# Patient Record
Sex: Female | Born: 1975
Health system: Southern US, Community
[De-identification: ages and names within clinical notes are randomized; demographics above are authoritative.]

## PROBLEM LIST (undated history)

## (undated) DIAGNOSIS — D509 Iron deficiency anemia, unspecified: Secondary | ICD-10-CM

## (undated) DIAGNOSIS — F419 Anxiety disorder, unspecified: Secondary | ICD-10-CM

## (undated) DIAGNOSIS — I779 Disorder of arteries and arterioles, unspecified: Secondary | ICD-10-CM

## (undated) DIAGNOSIS — E785 Hyperlipidemia, unspecified: Secondary | ICD-10-CM

## (undated) DIAGNOSIS — Z9889 Other specified postprocedural states: Secondary | ICD-10-CM

## (undated) DIAGNOSIS — D573 Sickle-cell trait: Secondary | ICD-10-CM

## (undated) DIAGNOSIS — R011 Cardiac murmur, unspecified: Secondary | ICD-10-CM

## (undated) DIAGNOSIS — Z794 Long term (current) use of insulin: Secondary | ICD-10-CM

## (undated) DIAGNOSIS — E119 Type 2 diabetes mellitus without complications: Secondary | ICD-10-CM

## (undated) DIAGNOSIS — R0600 Dyspnea, unspecified: Secondary | ICD-10-CM

## (undated) DIAGNOSIS — Z9289 Personal history of other medical treatment: Secondary | ICD-10-CM

## (undated) DIAGNOSIS — J189 Pneumonia, unspecified organism: Secondary | ICD-10-CM

## (undated) DIAGNOSIS — N186 End stage renal disease: Secondary | ICD-10-CM

## (undated) DIAGNOSIS — M1710 Unilateral primary osteoarthritis, unspecified knee: Secondary | ICD-10-CM

## (undated) DIAGNOSIS — E113499 Type 2 diabetes mellitus with severe nonproliferative diabetic retinopathy without macular edema, unspecified eye: Secondary | ICD-10-CM

## (undated) DIAGNOSIS — M6702 Short Achilles tendon (acquired), left ankle: Secondary | ICD-10-CM

## (undated) DIAGNOSIS — N939 Abnormal uterine and vaginal bleeding, unspecified: Secondary | ICD-10-CM

## (undated) DIAGNOSIS — F319 Bipolar disorder, unspecified: Secondary | ICD-10-CM

## (undated) DIAGNOSIS — I2510 Atherosclerotic heart disease of native coronary artery without angina pectoris: Secondary | ICD-10-CM

## (undated) DIAGNOSIS — Z992 Dependence on renal dialysis: Secondary | ICD-10-CM

## (undated) DIAGNOSIS — F329 Major depressive disorder, single episode, unspecified: Secondary | ICD-10-CM

## (undated) DIAGNOSIS — Z8614 Personal history of Methicillin resistant Staphylococcus aureus infection: Secondary | ICD-10-CM

## (undated) DIAGNOSIS — I219 Acute myocardial infarction, unspecified: Secondary | ICD-10-CM

## (undated) DIAGNOSIS — H3500 Unspecified background retinopathy: Secondary | ICD-10-CM

## (undated) DIAGNOSIS — Z9861 Coronary angioplasty status: Secondary | ICD-10-CM

## (undated) DIAGNOSIS — E1140 Type 2 diabetes mellitus with diabetic neuropathy, unspecified: Secondary | ICD-10-CM

## (undated) DIAGNOSIS — R112 Nausea with vomiting, unspecified: Secondary | ICD-10-CM

## (undated) DIAGNOSIS — I10 Essential (primary) hypertension: Secondary | ICD-10-CM

## (undated) DIAGNOSIS — F32A Depression, unspecified: Secondary | ICD-10-CM

## (undated) DIAGNOSIS — I252 Old myocardial infarction: Secondary | ICD-10-CM

## (undated) DIAGNOSIS — E11610 Type 2 diabetes mellitus with diabetic neuropathic arthropathy: Secondary | ICD-10-CM

## (undated) HISTORY — DX: Hyperlipidemia, unspecified: E78.5

## (undated) HISTORY — DX: End stage renal disease: N18.6

## (undated) HISTORY — DX: Unspecified background retinopathy: H35.00

## (undated) HISTORY — PX: FOOT SURGERY: SHX648

## (undated) HISTORY — DX: Cardiac murmur, unspecified: R01.1

## (undated) HISTORY — PX: CORONARY ANGIOPLASTY: SHX604

## (undated) HISTORY — DX: Old myocardial infarction: I25.2

## (undated) HISTORY — DX: Dependence on renal dialysis: Z99.2

---

## 1995-11-15 HISTORY — PX: TUBAL LIGATION: SHX77

## 1998-09-28 ENCOUNTER — Emergency Department (HOSPITAL_COMMUNITY): Admission: EM | Admit: 1998-09-28 | Discharge: 1998-09-28 | Payer: Self-pay | Admitting: Emergency Medicine

## 1998-10-07 ENCOUNTER — Emergency Department (HOSPITAL_COMMUNITY): Admission: EM | Admit: 1998-10-07 | Discharge: 1998-10-07 | Payer: Self-pay | Admitting: Emergency Medicine

## 1999-05-11 ENCOUNTER — Encounter: Payer: Self-pay | Admitting: Internal Medicine

## 1999-05-11 ENCOUNTER — Ambulatory Visit (HOSPITAL_COMMUNITY): Admission: RE | Admit: 1999-05-11 | Discharge: 1999-05-11 | Payer: Self-pay | Admitting: Internal Medicine

## 2000-04-19 ENCOUNTER — Emergency Department (HOSPITAL_COMMUNITY): Admission: EM | Admit: 2000-04-19 | Discharge: 2000-04-19 | Payer: Self-pay | Admitting: Emergency Medicine

## 2000-05-28 ENCOUNTER — Inpatient Hospital Stay (HOSPITAL_COMMUNITY): Admission: AD | Admit: 2000-05-28 | Discharge: 2000-05-28 | Payer: Self-pay | Admitting: *Deleted

## 2000-06-22 ENCOUNTER — Encounter: Payer: Self-pay | Admitting: Obstetrics & Gynecology

## 2000-06-22 ENCOUNTER — Inpatient Hospital Stay (HOSPITAL_COMMUNITY): Admission: EM | Admit: 2000-06-22 | Discharge: 2000-06-22 | Payer: Self-pay | Admitting: Obstetrics & Gynecology

## 2000-06-22 ENCOUNTER — Encounter (INDEPENDENT_AMBULATORY_CARE_PROVIDER_SITE_OTHER): Payer: Self-pay | Admitting: Specialist

## 2000-06-29 ENCOUNTER — Encounter: Admission: RE | Admit: 2000-06-29 | Discharge: 2000-06-29 | Payer: Self-pay | Admitting: Obstetrics

## 2000-11-10 ENCOUNTER — Emergency Department (HOSPITAL_COMMUNITY): Admission: EM | Admit: 2000-11-10 | Discharge: 2000-11-10 | Payer: Self-pay | Admitting: Emergency Medicine

## 2000-11-10 ENCOUNTER — Encounter: Payer: Self-pay | Admitting: Emergency Medicine

## 2000-11-13 ENCOUNTER — Emergency Department (HOSPITAL_COMMUNITY): Admission: EM | Admit: 2000-11-13 | Discharge: 2000-11-13 | Payer: Self-pay | Admitting: Emergency Medicine

## 2000-11-21 ENCOUNTER — Emergency Department (HOSPITAL_COMMUNITY): Admission: EM | Admit: 2000-11-21 | Discharge: 2000-11-21 | Payer: Self-pay | Admitting: Emergency Medicine

## 2001-04-08 ENCOUNTER — Emergency Department (HOSPITAL_COMMUNITY): Admission: EM | Admit: 2001-04-08 | Discharge: 2001-04-08 | Payer: Self-pay | Admitting: Emergency Medicine

## 2001-10-04 ENCOUNTER — Inpatient Hospital Stay (HOSPITAL_COMMUNITY): Admission: AD | Admit: 2001-10-04 | Discharge: 2001-10-04 | Payer: Self-pay | Admitting: Obstetrics & Gynecology

## 2002-01-21 ENCOUNTER — Inpatient Hospital Stay (HOSPITAL_COMMUNITY): Admission: AD | Admit: 2002-01-21 | Discharge: 2002-01-21 | Payer: Self-pay | Admitting: *Deleted

## 2002-03-27 ENCOUNTER — Emergency Department (HOSPITAL_COMMUNITY): Admission: EM | Admit: 2002-03-27 | Discharge: 2002-03-27 | Payer: Self-pay | Admitting: *Deleted

## 2003-02-19 ENCOUNTER — Emergency Department (HOSPITAL_COMMUNITY): Admission: EM | Admit: 2003-02-19 | Discharge: 2003-02-19 | Payer: Self-pay | Admitting: Emergency Medicine

## 2003-02-19 ENCOUNTER — Encounter: Payer: Self-pay | Admitting: Emergency Medicine

## 2003-05-31 ENCOUNTER — Emergency Department (HOSPITAL_COMMUNITY): Admission: AD | Admit: 2003-05-31 | Discharge: 2003-06-01 | Payer: Self-pay | Admitting: Emergency Medicine

## 2003-06-10 ENCOUNTER — Encounter: Admission: RE | Admit: 2003-06-10 | Discharge: 2003-09-08 | Payer: Self-pay | Admitting: Emergency Medicine

## 2003-08-17 ENCOUNTER — Emergency Department (HOSPITAL_COMMUNITY): Admission: EM | Admit: 2003-08-17 | Discharge: 2003-08-17 | Payer: Self-pay | Admitting: Emergency Medicine

## 2004-09-25 ENCOUNTER — Emergency Department (HOSPITAL_COMMUNITY): Admission: EM | Admit: 2004-09-25 | Discharge: 2004-09-26 | Payer: Self-pay | Admitting: Emergency Medicine

## 2004-10-26 ENCOUNTER — Emergency Department (HOSPITAL_COMMUNITY): Admission: EM | Admit: 2004-10-26 | Discharge: 2004-10-26 | Payer: Self-pay | Admitting: Emergency Medicine

## 2005-02-02 ENCOUNTER — Ambulatory Visit: Payer: Self-pay | Admitting: Nurse Practitioner

## 2005-02-07 ENCOUNTER — Ambulatory Visit: Payer: Self-pay | Admitting: Nurse Practitioner

## 2005-02-07 ENCOUNTER — Other Ambulatory Visit: Admission: RE | Admit: 2005-02-07 | Discharge: 2005-02-07 | Payer: Self-pay | Admitting: Family Medicine

## 2005-03-31 ENCOUNTER — Ambulatory Visit: Payer: Self-pay | Admitting: *Deleted

## 2005-04-21 ENCOUNTER — Emergency Department (HOSPITAL_COMMUNITY): Admission: EM | Admit: 2005-04-21 | Discharge: 2005-04-21 | Payer: Self-pay | Admitting: Emergency Medicine

## 2005-08-14 ENCOUNTER — Emergency Department (HOSPITAL_COMMUNITY): Admission: EM | Admit: 2005-08-14 | Discharge: 2005-08-14 | Payer: Self-pay | Admitting: Family Medicine

## 2005-08-16 ENCOUNTER — Emergency Department (HOSPITAL_COMMUNITY): Admission: EM | Admit: 2005-08-16 | Discharge: 2005-08-16 | Payer: Self-pay | Admitting: Emergency Medicine

## 2005-11-14 DIAGNOSIS — Z8614 Personal history of Methicillin resistant Staphylococcus aureus infection: Secondary | ICD-10-CM

## 2005-11-14 HISTORY — DX: Personal history of Methicillin resistant Staphylococcus aureus infection: Z86.14

## 2006-02-08 ENCOUNTER — Ambulatory Visit: Payer: Self-pay | Admitting: Infectious Diseases

## 2006-02-08 ENCOUNTER — Inpatient Hospital Stay (HOSPITAL_COMMUNITY): Admission: EM | Admit: 2006-02-08 | Discharge: 2006-02-13 | Payer: Self-pay | Admitting: Emergency Medicine

## 2006-02-16 ENCOUNTER — Encounter: Admission: RE | Admit: 2006-02-16 | Discharge: 2006-02-16 | Payer: Self-pay | Admitting: Family Medicine

## 2006-02-21 ENCOUNTER — Ambulatory Visit: Payer: Self-pay | Admitting: Hospitalist

## 2006-02-24 ENCOUNTER — Ambulatory Visit: Payer: Self-pay | Admitting: Hospitalist

## 2006-03-03 ENCOUNTER — Ambulatory Visit: Payer: Self-pay | Admitting: Hospitalist

## 2006-03-08 ENCOUNTER — Ambulatory Visit: Payer: Self-pay | Admitting: Internal Medicine

## 2006-04-20 ENCOUNTER — Ambulatory Visit: Payer: Self-pay | Admitting: Internal Medicine

## 2006-04-27 ENCOUNTER — Emergency Department (HOSPITAL_COMMUNITY): Admission: EM | Admit: 2006-04-27 | Discharge: 2006-04-27 | Payer: Self-pay | Admitting: Emergency Medicine

## 2006-06-24 ENCOUNTER — Inpatient Hospital Stay (HOSPITAL_COMMUNITY): Admission: AD | Admit: 2006-06-24 | Discharge: 2006-06-24 | Payer: Self-pay | Admitting: Family Medicine

## 2006-06-30 ENCOUNTER — Ambulatory Visit: Payer: Self-pay | Admitting: Internal Medicine

## 2006-07-19 ENCOUNTER — Ambulatory Visit: Payer: Self-pay | Admitting: Internal Medicine

## 2006-08-15 ENCOUNTER — Ambulatory Visit: Payer: Self-pay | Admitting: Internal Medicine

## 2006-08-29 ENCOUNTER — Encounter (INDEPENDENT_AMBULATORY_CARE_PROVIDER_SITE_OTHER): Payer: Self-pay | Admitting: Internal Medicine

## 2006-08-29 ENCOUNTER — Ambulatory Visit: Payer: Self-pay | Admitting: Internal Medicine

## 2006-08-29 LAB — CONVERTED CEMR LAB
ALT: 15 units/L (ref 0–40)
AST: 14 units/L (ref 0–37)
Albumin: 3.5 g/dL (ref 3.5–5.2)
Alkaline Phosphatase: 99 units/L (ref 39–117)
BUN: 6 mg/dL (ref 6–23)
CO2: 27 meq/L (ref 19–32)
Calcium: 9.1 mg/dL (ref 8.4–10.5)
Chloride: 98 meq/L (ref 96–112)
Creatinine, Ser: 0.9 mg/dL (ref 0.40–1.20)
Glucose, Bld: 395 mg/dL — ABNORMAL HIGH (ref 70–99)
HCT: 38.7 % (ref 36.0–46.0)
Hemoglobin: 12.8 g/dL (ref 12.0–15.0)
Leukocyte count, blood: 8.9 10*9/L (ref 4.0–10.5)
MCHC: 33.1 g/dL (ref 30.0–36.0)
MCV: 78.2 fL (ref 78.0–100.0)
Platelets: 290 10*3/uL (ref 150–400)
Potassium: 4.1 meq/L (ref 3.5–5.3)
RBC: 4.95 M/uL (ref 3.87–5.11)
RDW: 14 % (ref 11.5–14.0)
Sodium: 135 meq/L (ref 135–145)
Total Bilirubin: 0.6 mg/dL (ref 0.3–1.2)
Total Protein: 6.3 g/dL (ref 6.0–8.3)

## 2007-05-08 ENCOUNTER — Ambulatory Visit: Payer: Self-pay | Admitting: Internal Medicine

## 2007-05-08 ENCOUNTER — Encounter (INDEPENDENT_AMBULATORY_CARE_PROVIDER_SITE_OTHER): Payer: Self-pay | Admitting: Internal Medicine

## 2007-05-08 DIAGNOSIS — E113493 Type 2 diabetes mellitus with severe nonproliferative diabetic retinopathy without macular edema, bilateral: Secondary | ICD-10-CM | POA: Insufficient documentation

## 2007-05-08 DIAGNOSIS — J45909 Unspecified asthma, uncomplicated: Secondary | ICD-10-CM | POA: Insufficient documentation

## 2007-05-08 DIAGNOSIS — Z794 Long term (current) use of insulin: Secondary | ICD-10-CM | POA: Insufficient documentation

## 2007-05-08 LAB — CONVERTED CEMR LAB
Blood Glucose, Fingerstick: 415
Hgb A1c MFr Bld: >14 %

## 2007-05-09 ENCOUNTER — Ambulatory Visit: Payer: Self-pay | Admitting: Internal Medicine

## 2007-05-09 LAB — CONVERTED CEMR LAB
ALT: 13 units/L (ref 0–35)
AST: 13 units/L (ref 0–37)
Albumin: 4 g/dL (ref 3.5–5.2)
Alkaline Phosphatase: 104 units/L (ref 39–117)
BUN: 10 mg/dL (ref 6–23)
Bilirubin Urine: NEGATIVE
Blood Glucose, Home Monitor: 1 mg/dL
C-Peptide: 1.18 ng/mL (ref 0.80–3.90)
CO2: 27 meq/L (ref 19–32)
Calcium: 9.4 mg/dL (ref 8.4–10.5)
Chloride: 100 meq/L (ref 96–112)
Cholesterol: 230 mg/dL — ABNORMAL HIGH (ref 0–200)
Creatinine, Ser: 0.94 mg/dL (ref 0.40–1.20)
Creatinine, Urine: 91.7 mg/dL
Glucose, Bld: 339 mg/dL — ABNORMAL HIGH (ref 70–99)
HCT: 39.8 % (ref 36.0–46.0)
HDL: 43 mg/dL (ref >39)
Hemoglobin, Urine: NEGATIVE
Hemoglobin: 12.6 g/dL (ref 12.0–15.0)
Ketones, ur: 15 mg/dL — AB
LDL Cholesterol: 145 mg/dL — ABNORMAL HIGH (ref 0–99)
Leukocytes, UA: NEGATIVE
MCHC: 31.7 g/dL (ref 30.0–36.0)
MCV: 79.8 fL (ref 78.0–100.0)
Microalb Creat Ratio: 4.3 mg/g (ref 0.0–30.0)
Microalb, Ur: 0.39 mg/dL (ref 0.00–1.89)
Nitrite: NEGATIVE
Platelets: 260 10*3/uL (ref 150–400)
Potassium: 4.1 meq/L (ref 3.5–5.3)
Protein, ur: NEGATIVE mg/dL
RBC: 4.99 M/uL (ref 3.87–5.11)
RDW: 13.9 % (ref 11.5–14.0)
Sodium: 137 meq/L (ref 135–145)
Specific Gravity, Urine: 1.043 — ABNORMAL HIGH (ref 1.005–1.03)
Total Bilirubin: 0.4 mg/dL (ref 0.3–1.2)
Total CHOL/HDL Ratio: 5.3
Total Protein: 7.3 g/dL (ref 6.0–8.3)
Triglycerides: 209 mg/dL — ABNORMAL HIGH (ref <150)
Urine Glucose: >1000 mg/dL — AB
Urobilinogen, UA: 0.2 (ref 0.0–1.0)
VLDL: 42 mg/dL — ABNORMAL HIGH (ref 0–40)
WBC: 6.8 10*3/uL (ref 4.0–10.5)
pH: 6 (ref 5.0–8.0)

## 2007-05-11 ENCOUNTER — Telehealth (INDEPENDENT_AMBULATORY_CARE_PROVIDER_SITE_OTHER): Payer: Self-pay | Admitting: *Deleted

## 2007-06-01 ENCOUNTER — Emergency Department (HOSPITAL_COMMUNITY): Admission: EM | Admit: 2007-06-01 | Discharge: 2007-06-01 | Payer: Self-pay | Admitting: Emergency Medicine

## 2007-10-30 ENCOUNTER — Telehealth (INDEPENDENT_AMBULATORY_CARE_PROVIDER_SITE_OTHER): Payer: Self-pay | Admitting: *Deleted

## 2007-11-27 ENCOUNTER — Emergency Department (HOSPITAL_COMMUNITY): Admission: EM | Admit: 2007-11-27 | Discharge: 2007-11-27 | Payer: Self-pay | Admitting: Emergency Medicine

## 2008-04-01 ENCOUNTER — Inpatient Hospital Stay (HOSPITAL_COMMUNITY): Admission: AD | Admit: 2008-04-01 | Discharge: 2008-04-01 | Payer: Self-pay | Admitting: Obstetrics & Gynecology

## 2008-06-20 ENCOUNTER — Emergency Department (HOSPITAL_COMMUNITY): Admission: EM | Admit: 2008-06-20 | Discharge: 2008-06-20 | Payer: Self-pay | Admitting: Emergency Medicine

## 2008-09-19 ENCOUNTER — Emergency Department (HOSPITAL_COMMUNITY): Admission: EM | Admit: 2008-09-19 | Discharge: 2008-09-19 | Payer: Self-pay | Admitting: Emergency Medicine

## 2008-10-02 ENCOUNTER — Inpatient Hospital Stay (HOSPITAL_COMMUNITY): Admission: AD | Admit: 2008-10-02 | Discharge: 2008-10-02 | Payer: Self-pay | Admitting: Family Medicine

## 2008-10-27 ENCOUNTER — Emergency Department (HOSPITAL_COMMUNITY): Admission: EM | Admit: 2008-10-27 | Discharge: 2008-10-27 | Payer: Self-pay | Admitting: Emergency Medicine

## 2008-10-28 ENCOUNTER — Emergency Department (HOSPITAL_COMMUNITY): Admission: EM | Admit: 2008-10-28 | Discharge: 2008-10-28 | Payer: Self-pay | Admitting: Emergency Medicine

## 2008-10-29 ENCOUNTER — Ambulatory Visit: Payer: Self-pay | Admitting: Internal Medicine

## 2008-10-29 DIAGNOSIS — N920 Excessive and frequent menstruation with regular cycle: Secondary | ICD-10-CM | POA: Insufficient documentation

## 2008-10-29 LAB — CONVERTED CEMR LAB
Blood Glucose, Fingerstick: 377
Hgb A1c MFr Bld: >14 %

## 2009-01-11 ENCOUNTER — Inpatient Hospital Stay (HOSPITAL_COMMUNITY): Admission: EM | Admit: 2009-01-11 | Discharge: 2009-01-11 | Payer: Self-pay | Admitting: Emergency Medicine

## 2009-01-11 ENCOUNTER — Encounter (INDEPENDENT_AMBULATORY_CARE_PROVIDER_SITE_OTHER): Payer: Self-pay | Admitting: *Deleted

## 2009-01-11 ENCOUNTER — Ambulatory Visit: Payer: Self-pay | Admitting: Internal Medicine

## 2009-01-11 DIAGNOSIS — J069 Acute upper respiratory infection, unspecified: Secondary | ICD-10-CM | POA: Insufficient documentation

## 2009-01-11 LAB — CONVERTED CEMR LAB: Hgb A1c MFr Bld: 13.3 %

## 2009-01-14 ENCOUNTER — Encounter (INDEPENDENT_AMBULATORY_CARE_PROVIDER_SITE_OTHER): Payer: Self-pay | Admitting: *Deleted

## 2009-01-14 ENCOUNTER — Ambulatory Visit: Payer: Self-pay | Admitting: *Deleted

## 2009-01-14 LAB — CONVERTED CEMR LAB: Blood Glucose, Fingerstick: 366

## 2009-01-15 ENCOUNTER — Telehealth: Payer: Self-pay | Admitting: Licensed Clinical Social Worker

## 2009-01-26 ENCOUNTER — Inpatient Hospital Stay (HOSPITAL_COMMUNITY): Admission: AD | Admit: 2009-01-26 | Discharge: 2009-01-26 | Payer: Self-pay | Admitting: Obstetrics & Gynecology

## 2009-01-28 ENCOUNTER — Encounter: Payer: Self-pay | Admitting: Licensed Clinical Social Worker

## 2009-01-28 ENCOUNTER — Ambulatory Visit: Payer: Self-pay | Admitting: *Deleted

## 2009-01-28 LAB — CONVERTED CEMR LAB: Blood Glucose, Fingerstick: 269

## 2009-02-11 ENCOUNTER — Encounter (INDEPENDENT_AMBULATORY_CARE_PROVIDER_SITE_OTHER): Payer: Self-pay | Admitting: *Deleted

## 2009-02-11 ENCOUNTER — Ambulatory Visit: Payer: Self-pay | Admitting: Internal Medicine

## 2009-02-11 LAB — CONVERTED CEMR LAB
CO2: 26 meq/L (ref 19–32)
Calcium: 9.2 mg/dL (ref 8.4–10.5)
Creatinine, Ser: 0.97 mg/dL (ref 0.40–1.20)
GFR calc Af Amer: >60 mL/min (ref >60)
Hemoglobin, Urine: NEGATIVE
Leukocytes, UA: NEGATIVE
Protein, ur: NEGATIVE mg/dL
Urine Glucose: >1000 mg/dL — AB
Urobilinogen, UA: 0.2 (ref 0.0–1.0)

## 2009-02-26 ENCOUNTER — Encounter: Payer: Self-pay | Admitting: Internal Medicine

## 2009-02-26 ENCOUNTER — Ambulatory Visit: Payer: Self-pay | Admitting: Internal Medicine

## 2009-02-26 DIAGNOSIS — E785 Hyperlipidemia, unspecified: Secondary | ICD-10-CM

## 2009-02-26 DIAGNOSIS — E1169 Type 2 diabetes mellitus with other specified complication: Secondary | ICD-10-CM | POA: Insufficient documentation

## 2009-02-26 LAB — CONVERTED CEMR LAB
Blood Glucose, Fingerstick: 188
HDL: 48 mg/dL (ref >39)
LDL Cholesterol: 118 mg/dL — ABNORMAL HIGH (ref 0–99)
Triglycerides: 104 mg/dL (ref <150)

## 2009-03-02 ENCOUNTER — Emergency Department (HOSPITAL_COMMUNITY): Admission: EM | Admit: 2009-03-02 | Discharge: 2009-03-02 | Payer: Self-pay | Admitting: Emergency Medicine

## 2009-03-12 ENCOUNTER — Ambulatory Visit: Payer: Self-pay | Admitting: Internal Medicine

## 2009-03-12 ENCOUNTER — Encounter: Payer: Self-pay | Admitting: Internal Medicine

## 2009-03-12 DIAGNOSIS — IMO0001 Reserved for inherently not codable concepts without codable children: Secondary | ICD-10-CM | POA: Insufficient documentation

## 2009-03-12 DIAGNOSIS — E1165 Type 2 diabetes mellitus with hyperglycemia: Secondary | ICD-10-CM

## 2009-04-06 ENCOUNTER — Emergency Department (HOSPITAL_COMMUNITY): Admission: EM | Admit: 2009-04-06 | Discharge: 2009-04-06 | Payer: Self-pay | Admitting: Emergency Medicine

## 2009-04-10 ENCOUNTER — Telehealth (INDEPENDENT_AMBULATORY_CARE_PROVIDER_SITE_OTHER): Payer: Self-pay | Admitting: *Deleted

## 2009-04-15 ENCOUNTER — Telehealth (INDEPENDENT_AMBULATORY_CARE_PROVIDER_SITE_OTHER): Payer: Self-pay | Admitting: *Deleted

## 2009-07-10 ENCOUNTER — Emergency Department (HOSPITAL_COMMUNITY): Admission: EM | Admit: 2009-07-10 | Discharge: 2009-07-11 | Payer: Self-pay | Admitting: Emergency Medicine

## 2009-07-20 ENCOUNTER — Emergency Department (HOSPITAL_COMMUNITY): Admission: EM | Admit: 2009-07-20 | Discharge: 2009-07-20 | Payer: Self-pay | Admitting: Emergency Medicine

## 2009-08-13 ENCOUNTER — Emergency Department (HOSPITAL_COMMUNITY): Admission: EM | Admit: 2009-08-13 | Discharge: 2009-08-13 | Payer: Self-pay | Admitting: Emergency Medicine

## 2009-08-20 ENCOUNTER — Ambulatory Visit: Payer: Self-pay | Admitting: Internal Medicine

## 2009-08-20 DIAGNOSIS — L089 Local infection of the skin and subcutaneous tissue, unspecified: Secondary | ICD-10-CM | POA: Insufficient documentation

## 2009-08-20 LAB — CONVERTED CEMR LAB
BUN: 11 mg/dL (ref 6–23)
Chloride: 104 meq/L (ref 96–112)
Creatinine, Urine: 234.3 mg/dL
Glucose, Bld: 183 mg/dL — ABNORMAL HIGH (ref 70–99)
Hemoglobin: 12.4 g/dL (ref 12.0–15.0)
Microalb Creat Ratio: 3.8 mg/g (ref 0.0–30.0)
Potassium: 4.1 meq/L (ref 3.5–5.3)
RBC: 4.87 M/uL (ref 3.87–5.11)

## 2009-08-25 ENCOUNTER — Telehealth (INDEPENDENT_AMBULATORY_CARE_PROVIDER_SITE_OTHER): Payer: Self-pay | Admitting: *Deleted

## 2009-09-03 ENCOUNTER — Encounter: Payer: Self-pay | Admitting: Internal Medicine

## 2009-09-04 ENCOUNTER — Telehealth (INDEPENDENT_AMBULATORY_CARE_PROVIDER_SITE_OTHER): Payer: Self-pay | Admitting: *Deleted

## 2009-09-25 ENCOUNTER — Encounter: Payer: Self-pay | Admitting: Internal Medicine

## 2009-09-25 ENCOUNTER — Ambulatory Visit: Payer: Self-pay | Admitting: Infectious Disease

## 2009-12-16 ENCOUNTER — Emergency Department (HOSPITAL_COMMUNITY): Admission: EM | Admit: 2009-12-16 | Discharge: 2009-12-16 | Payer: Self-pay | Admitting: Emergency Medicine

## 2010-03-09 ENCOUNTER — Ambulatory Visit: Payer: Self-pay | Admitting: Internal Medicine

## 2010-03-09 DIAGNOSIS — R079 Chest pain, unspecified: Secondary | ICD-10-CM | POA: Insufficient documentation

## 2010-06-14 ENCOUNTER — Ambulatory Visit: Payer: Self-pay | Admitting: Internal Medicine

## 2010-06-14 ENCOUNTER — Encounter: Payer: Self-pay | Admitting: Internal Medicine

## 2010-06-14 DIAGNOSIS — F17200 Nicotine dependence, unspecified, uncomplicated: Secondary | ICD-10-CM | POA: Insufficient documentation

## 2010-06-22 ENCOUNTER — Ambulatory Visit: Payer: Self-pay | Admitting: Internal Medicine

## 2010-06-22 LAB — CONVERTED CEMR LAB: Blood Glucose, Fingerstick: 222

## 2010-09-06 ENCOUNTER — Emergency Department (HOSPITAL_COMMUNITY): Admission: EM | Admit: 2010-09-06 | Discharge: 2010-09-07 | Payer: Self-pay | Admitting: Emergency Medicine

## 2010-11-28 ENCOUNTER — Inpatient Hospital Stay (HOSPITAL_COMMUNITY)
Admission: AD | Admit: 2010-11-28 | Discharge: 2010-11-28 | Payer: Self-pay | Source: Home / Self Care | Attending: Family Medicine | Admitting: Family Medicine

## 2010-11-29 LAB — COMPREHENSIVE METABOLIC PANEL
ALT: 17 U/L (ref 0–35)
AST: 24 U/L (ref 0–37)
Albumin: 3.9 g/dL (ref 3.5–5.2)
Alkaline Phosphatase: 108 U/L (ref 39–117)
BUN: 9 mg/dL (ref 6–23)
CO2: 23 mEq/L (ref 19–32)
Calcium: 9 mg/dL (ref 8.4–10.5)
Chloride: 98 mEq/L (ref 96–112)
Creatinine, Ser: 1.03 mg/dL (ref 0.4–1.2)
GFR calc Af Amer: >60 mL/min (ref >60)
GFR calc non Af Amer: >60 mL/min (ref >60)
Glucose, Bld: 646 mg/dL (ref 70–99)
Potassium: 4 mEq/L (ref 3.5–5.1)
Sodium: 130 mEq/L — ABNORMAL LOW (ref 135–145)
Total Bilirubin: 0.3 mg/dL (ref 0.3–1.2)
Total Protein: 6.9 g/dL (ref 6.0–8.3)

## 2010-11-29 LAB — CBC
HCT: 35.5 % — ABNORMAL LOW (ref 36.0–46.0)
Hemoglobin: 11.2 g/dL — ABNORMAL LOW (ref 12.0–15.0)
MCH: 23.7 pg — ABNORMAL LOW (ref 26.0–34.0)
MCHC: 31.5 g/dL (ref 30.0–36.0)
MCV: 75.1 fL — ABNORMAL LOW (ref 78.0–100.0)
Platelets: 270 10*3/uL (ref 150–400)
RBC: 4.73 MIL/uL (ref 3.87–5.11)
RDW: 15.3 % (ref 11.5–15.5)
WBC: 8.3 10*3/uL (ref 4.0–10.5)

## 2010-11-29 LAB — SAMPLE TO BLOOD BANK

## 2010-11-29 LAB — WET PREP, GENITAL
Trich, Wet Prep: NONE SEEN
Yeast Wet Prep HPF POC: NONE SEEN

## 2010-11-29 LAB — GLUCOSE, CAPILLARY: Glucose-Capillary: >600 mg/dL (ref 70–99)

## 2010-11-29 LAB — POCT PREGNANCY, URINE: Preg Test, Ur: NEGATIVE

## 2010-12-01 LAB — GC/CHLAMYDIA PROBE AMP, GENITAL
Chlamydia, DNA Probe: NEGATIVE
GC Probe Amp, Genital: NEGATIVE

## 2010-12-16 NOTE — Assessment & Plan Note (Signed)
Summary: ESW-1 WEEK F/U VISIT/CH   Vital Signs:  Patient profile:   35 year old female Height:      64 inches Weight:      223.1 pounds BMI:     38.43 Temp:     98.8 degrees F oral Pulse rate:   80 / minute BP sitting:   123 / 78  (right arm)  Vitals Entered By: Silverio Decamp NT II (June 22, 2010 8:39 AM) CC: FFOLLOWUP[VISIT FOR DIABETIES PATIENT HAS COLD OR ALLERGY Is Patient Diabetic? Yes Did you bring your meter with you today? Yes Pain Assessment Patient in pain? no      Nutritional Status BMI of > 30 = obese CBG Result 222  Have you ever been in a relationship where you felt threatened, hurt or afraid?No   Does patient need assistance? Functional Status Self care Ambulation Normal   Primary Care Provider:  Vertell Limber MD  CC:  FFOLLOWUP[VISIT FOR DIABETIES PATIENT HAS COLD OR ALLERGY.  History of Present Illness: patient is a 35 year old female with past medical history of uncontrolled diabetes and mild asthma. She presented to outpatient clinic for 1 week follow-up after restart of insulin, Patient has her meter, and her sugars are better on her 30units insulin 70/30 two times a day, will continue to give current dose, and bring back in 2 weeks for a f/u and further adjustments.  Patient c/o of nasal congestion, denies cough, would like something for that.   Patient is feeling well and denies CP, abdominal pain, nausea, vomiting, HA's, palpitations, blurred vision. fever, chills, diarrhea, constipation or SOB.    Preventive Screening-Counseling & Management  Alcohol-Tobacco     Alcohol type: occasionally     Smoking Status: current     Smoking Cessation Counseling: yes     Packs/Day: 10 cigs per month  Caffeine-Diet-Exercise     Does Patient Exercise: no  Current Medications (verified): 1)  Relion 70/30 70-30 % Susp (Insulin Isophane & Regular) .... Take 30 Units in The Morning and 30 Units in The Evening 2)  Proventil 90 Mcg/act  Aers (Albuterol)  .... 2 Puffs As Needed For Shortness of Breath 3)  Afrin Sinus 0.05 % Soln (Oxymetazoline Hcl) .... 2-3 Sprays in Each Nostril Two Times A Day For 3 Days. 4)  Bd Insulin Syringe Ultrafine 31g X 5/16" 0.5 Ml  Misc (Insulin Syringe-Needle U-100) .... To Inject Insulin Twice Daily 5)  Freestyle Test   Strp (Glucose Blood) .... 4 Times Daily 6)  Promethazine Hcl 25 Mg Tabs (Promethazine Hcl) .... Take 1 Tablet By Mouth Four Times A Day As Needed For Nausea. 7)  Prodigy Blood Glucose Monitor  Devi (Blood Glucose Monitoring Suppl) .... Check Cbg Three Times A Day 8)  Prodigy Blood Glucose Test  Strp (Glucose Blood) .... Check Cbg Two Times A Day 9)  Prodigy Lancets 28g  Misc (Lancets) .... Check Sugars Three Times A Day 10)  Pravastatin Sodium 40 Mg Tabs (Pravastatin Sodium) .... Take 1 Tablet By Mouth Once A Day 11)  Ibuprofen 400 Mg Tabs (Ibuprofen) .... Take 1 Tablet By Mouth Four Times A Day As Needed For Pain 12)  Decongestant 30 Mg Tabs (Pseudoephedrine Hcl) .... Take 1 Tablet By Mouth Three Times A Day As Needed For Congestion.  Allergies (verified): No Known Drug Allergies  Review of Systems       As per HPI  Physical Exam  General:  alert, well-developed, well-nourished, well-hydrated, and overweight-appearing.   Mouth:  pharynx pink and moist.   Neck:  supple.   Lungs:  normal respiratory effort, normal breath sounds, no crackles, and no wheezes.   Heart:  normal rate, regular rhythm, no murmur, and no gallop.   Abdomen:  soft, non-tender, normal bowel sounds, and no distention.   Msk:  normal ROM, no joint tenderness, no joint swelling, and no joint warmth.   Neurologic:  alert & oriented X3, cranial nerves II-XII intact, strength normal in all extremities, sensation intact to light touch, and DTRs symmetrical and normal.     Impression & Recommendations:  Problem # 1:  DIABETES MELLITUS, TYPE II, UNCONTROLLED (ICD-250.02) back on her insulin, CBG remain high, will continue 30  units two times a day, and bring back in 2 weeks for further adjustments.   Her updated medication list for this problem includes:    Relion 70/30 70-30 % Susp (Insulin isophane & regular) .Marland Kitchen... Take 30 units in the morning and 30 units in the evening  Problem # 2:  HYPERLIPIDEMIA (ICD-272.4) Patient LDL is above goal, she is not taking her meds, i told her to restart pravastatin, she agrees, script given, will recheck flp and lft in 1 month.  Her updated medication list for this problem includes:    Pravastatin Sodium 40 Mg Tabs (Pravastatin sodium) .Marland Kitchen... Take 1 tablet by mouth once a day  Problem # 3:  URI (ICD-465.9) Symptoms of URI, pseudophed given.  Her updated medication list for this problem includes:    Promethazine Hcl 25 Mg Tabs (Promethazine hcl) .Marland Kitchen... Take 1 tablet by mouth four times a day as needed for nausea.    Ibuprofen 400 Mg Tabs (Ibuprofen) .Marland Kitchen... Take 1 tablet by mouth four times a day as needed for pain  Problem # 4:  ASTHMA (ICD-493.90) stable   Her updated medication list for this problem includes:    Proventil 90 Mcg/act Aers (Albuterol) .Marland Kitchen... 2 puffs as needed for shortness of breath  Complete Medication List: 1)  Relion 70/30 70-30 % Susp (Insulin isophane & regular) .... Take 30 units in the morning and 30 units in the evening 2)  Proventil 90 Mcg/act Aers (Albuterol) .... 2 puffs as needed for shortness of breath 3)  Afrin Sinus 0.05 % Soln (Oxymetazoline hcl) .... 2-3 sprays in each nostril two times a day for 3 days. 4)  Bd Insulin Syringe Ultrafine 31g X 5/16" 0.5 Ml Misc (Insulin syringe-needle u-100) .... To inject insulin twice daily 5)  Freestyle Test Strp (Glucose blood) .... 4 times daily 6)  Promethazine Hcl 25 Mg Tabs (Promethazine hcl) .... Take 1 tablet by mouth four times a day as needed for nausea. 7)  Prodigy Blood Glucose Monitor Devi (Blood glucose monitoring suppl) .... Check cbg three times a day 8)  Prodigy Blood Glucose Test Strp  (Glucose blood) .... Check cbg two times a day 9)  Prodigy Lancets 28g Misc (Lancets) .... Check sugars three times a day 10)  Pravastatin Sodium 40 Mg Tabs (Pravastatin sodium) .... Take 1 tablet by mouth once a day 11)  Ibuprofen 400 Mg Tabs (Ibuprofen) .... Take 1 tablet by mouth four times a day as needed for pain 12)  Decongestant 30 Mg Tabs (Pseudoephedrine hcl) .... Take 1 tablet by mouth three times a day as needed for congestion.  Patient Instructions: 1)  Please schedule a follow-up appointment on Friday 26th Aug with Dr. Vanessa Kick.  Prescriptions: DECONGESTANT 30 MG TABS (PSEUDOEPHEDRINE HCL) Take 1 tablet by mouth three times a  day as needed for congestion.  #14 x 0   Entered and Authorized by:   Vertell Limber MD   Signed by:   Vertell Limber MD on 06/22/2010   Method used:   Electronically to        [facility] [phone]* (retail)       [place], [place]  [address]       Ph: [phone]       Fax: [phone]   RxIDUZ:[id number] PRAVASTATIN SODIUM 40 MG TABS (PRAVASTATIN SODIUM) Take 1 tablet by mouth once a day  #30 x 1   Entered and Authorized by:   Vertell Limber MD   Signed by:   Vertell Limber MD on 06/22/2010   Method used:   Electronically to        [facility] [phone]* (retail)       [address]       [place], [place]  [address]       Ph: [phone]       Fax: [phone]   RxIDES:[id number]   Prevention & Chronic Care Immunizations   Influenza vaccine: Not documented   Influenza vaccine deferral: Deferred  (08/20/2009)    Tetanus booster: Not documented   Td booster deferral: Deferred  (09/25/2009)    Pneumococcal vaccine: Not documented  Other Screening   Pap smear: Not documented   Pap smear action/deferral: GYN Referral  (08/20/2009)   Smoking status: current  (06/22/2010)   Smoking cessation counseling: yes  (06/22/2010)  Diabetes Mellitus   HgbA1C: >14.0  (06/14/2010)    Eye exam: Not  documented   Diabetic eye exam action/deferral: Ophthalmology referral  (06/14/2010)    Foot exam: yes  (02/26/2009)   High risk foot: Not documented   Foot care education: Not documented    Urine microalbumin/creatinine ratio: 3.8  (08/20/2009)   Urine microalbumin action/deferral: Ordered    Diabetes flowsheet reviewed?: Yes   Progress toward A1C goal: Unchanged  Lipids   Total Cholesterol: 187  (02/26/2009)   Lipid panel action/deferral: Refused   LDL: 118  (02/26/2009)   LDL Direct: Not documented   HDL: 48  (02/26/2009)   Triglycerides: 104  (02/26/2009)    SGOT (AST): 13  (05/08/2007)   SGPT (ALT): 13  (05/08/2007)   Alkaline phosphatase: 104  (05/08/2007)   Total bilirubin: 0.4  (05/08/2007)    Lipid flowsheet reviewed?: Yes   Progress toward LDL goal: Improved  Self-Management Support :   Personal Goals (by the next clinic visit) :     Personal A1C goal: 7  (09/25/2009)     Personal blood pressure goal: 130/80  (09/25/2009)     Personal LDL goal: 70  (09/25/2009)    Patient will work on the following items until the next clinic visit to reach self-care goals:     Medications and monitoring: take my medicines every day, check my blood sugar, examine my feet every day  (06/22/2010)     Eating: drink diet soda or water instead of juice or soda, eat more vegetables, use fresh or frozen vegetables, eat foods that are low in salt, eat baked foods instead of fried foods, eat fruit for snacks and desserts  (06/22/2010)     Activity: take a 30 minute walk every day  (06/22/2010)    Diabetes self-management support: Copy of home glucose meter record, Resources for patients handout, Written self-care plan  (06/22/2010)  Diabetes care plan printed   Last medical nutrition therapy: 05/09/2007    Lipid self-management support: Copy of home glucose meter record, Resources for patients handout, Written self-care plan  (06/22/2010)   Lipid self-care plan printed.      Resource  handout printed.

## 2010-12-16 NOTE — Letter (Signed)
Summary: BLOOD GLUCOSE REPORT  BLOOD GLUCOSE REPORT   Imported By: Garlan Fillers 07/14/2010 15:05:02  _____________________________________________________________________  External Attachment:    Type:   Image     Comment:   External Document

## 2010-12-16 NOTE — Assessment & Plan Note (Signed)
Summary: EST-CK/FU/MEDS/CFB COMPLETE PHYSICAL AND TB/CFB   Vital Signs:  Patient profile:   35 year old female Height:      64 inches (162.56 cm) Weight:      216.2 pounds (98.27 kg) BMI:     37.24 Temp:     98.7 degrees F (37.06 degrees C) oral Pulse rate:   84 / minute BP sitting:   113 / 74  (right arm)  Vitals Entered By: Hilda Blades Ditzler RN (June 14, 2010 9:47 AM) Is Patient Diabetic? Yes Did you bring your meter with you today? No Pain Assessment Patient in pain? no      Nutritional Status BMI of > 30 = obese Nutritional Status Detail appetite good CBG Result 487  Have you ever been in a relationship where you felt threatened, hurt or afraid?denies   Does patient need assistance? Functional Status Self care Ambulation Normal Comments Physical and refills on meds. TB test.   Primary Care Provider:  Vertell Limber MD   History of Present Illness: patient is a 35 year old female with past medical history of uncontrolled diabetes and mild asthma. She presented to outpatient clinic for regular follow-up.  Patient states that she is not taking her insulin for the past several weeks.  today the patient's A1c is more than 14.of her CBG is 487 today.  patient does not check her sugar and does not have a glucometer.   she would also like complete physical exam for her work and a TB skin test.   Pt denies fever, chills, or any other complaints, and otherwise doing well.   Depression History:      The patient denies a depressed mood most of the day and a diminished interest in her usual daily activities.         Preventive Screening-Counseling & Management  Alcohol-Tobacco     Alcohol type: occasionally     Smoking Status: current     Smoking Cessation Counseling: yes     Packs/Day: 10 cigs per month  Caffeine-Diet-Exercise     Does Patient Exercise: no  Current Medications (verified): 1)  Relion 70/30 70-30 % Susp (Insulin Isophane & Regular) .... Take 28 Units  in The Morning and 28 Units in The Evening 2)  Proventil 90 Mcg/act  Aers (Albuterol) .... 2 Puffs As Needed For Shortness of Breath 3)  Afrin Sinus 0.05 % Soln (Oxymetazoline Hcl) .... 2-3 Sprays in Each Nostril Two Times A Day For 3 Days. 4)  Bd Insulin Syringe Ultrafine 31g X 5/16" 0.5 Ml  Misc (Insulin Syringe-Needle U-100) .... To Inject Insulin Twice Daily 5)  Freestyle Test   Strp (Glucose Blood) .... 4 Times Daily 6)  Promethazine Hcl 25 Mg Tabs (Promethazine Hcl) .... Take 1 Tablet By Mouth Four Times A Day As Needed For Nausea. 7)  Prodigy Blood Glucose Monitor  Devi (Blood Glucose Monitoring Suppl) .... Check Cbg Three Times A Day 8)  Prodigy Blood Glucose Test  Strp (Glucose Blood) .... Check Cbg Two Times A Day 9)  Prodigy Lancets 28g  Misc (Lancets) .... Check Sugars Three Times A Day 10)  Pravastatin Sodium 20 Mg Tabs (Pravastatin Sodium) .... Once Daily At Bedtime 11)  Ibuprofen 400 Mg Tabs (Ibuprofen) .... Take 1 Tablet By Mouth Four Times A Day As Needed For Pain  Allergies: No Known Drug Allergies  Social History: Smoking Status:  current Packs/Day:  10 cigs per month  Review of Systems       Per HPI  Physical Exam  General:  alert, well-developed, well-nourished, well-hydrated, and overweight-appearing.   Mouth:  pharynx pink and moist.   Neck:  supple.   Lungs:  normal respiratory effort, normal breath sounds, no crackles, and no wheezes.   Heart:  normal rate, regular rhythm, no murmur, and no gallop.   Abdomen:  soft, non-tender, normal bowel sounds, and no distention.   Msk:  normal ROM, no joint tenderness, no joint swelling, and no joint warmth.   Pulses:  2+ Extremities:  No edema.  Neurologic:  alert & oriented X3, cranial nerves II-XII intact, strength normal in all extremities, sensation intact to light touch, and DTRs symmetrical and normal.   Skin:   turgor normal and no rashes.  Psych:  Oriented X3, memory intact for recent and remote, normally  interactive, good eye contact, not anxious appearing, and not depressed appearing.    Impression & Recommendations:  Problem # 1:  DIABETES MELLITUS, TYPE II, UNCONTROLLED (ICD-250.02) patient does not take her insulin and her A1c is above 14, and she will resume taking her insulin and check her sugars and come back for follow-up within one week.  She will also have a follow-up with our diabetic educator.  Her updated medication list for this problem includes:    Relion 70/30 70-30 % Susp (Insulin isophane & regular) .Marland Kitchen... Take 28 units in the morning and 28 units in the evening  Orders: T- Capillary Blood Glucose [id number]) T-Hgb A1C (in-house) [id number]) Ophthalmology Referral (Ophthalmology)  Labs Reviewed: Creat: 0.86 (08/20/2009)    Reviewed HgBA1c results: >14.0 (06/14/2010)  12.0 (03/09/2010)  Problem # 2:  HYPERLIPIDEMIA (ICD-272.4) Well controlled on current treatment, No new changes made today, Will continue to monitor.   Her updated medication list for this problem includes:    Pravastatin Sodium 20 Mg Tabs (Pravastatin sodium) ..... Once daily at bedtime  Labs Reviewed: SGOT: 13 (05/08/2007)   SGPT: 13 (05/08/2007)   HDL:48 (02/26/2009), 43 (05/08/2007)  LDL:118 (02/26/2009), 145 (05/08/2007)  Chol:187 (02/26/2009), 230 (05/08/2007)  Trig:104 (02/26/2009), 209 (05/08/2007)  Problem # 3:  ASTHMA (ICD-493.90)  Well controlled on current treatment, No new changes made today, Will continue to monitor.   Her updated medication list for this problem includes:    Proventil 90 Mcg/act Aers (Albuterol) .Marland Kitchen... 2 puffs as needed for shortness of breath  Orders: TB Skin Test [id number])  Problem # 4:  TOBACCO ABUSE (ICD-305.1) Patient was counseled on smoking cessation strategies including medications and behavior modification options. Patient said she was not ready to stop smoking at this time.    Complete Medication List: 1)  Relion 70/30 70-30 % Susp (Insulin isophane &  regular) .... Take 28 units in the morning and 28 units in the evening 2)  Proventil 90 Mcg/act Aers (Albuterol) .... 2 puffs as needed for shortness of breath 3)  Afrin Sinus 0.05 % Soln (Oxymetazoline hcl) .... 2-3 sprays in each nostril two times a day for 3 days. 4)  Bd Insulin Syringe Ultrafine 31g X 5/16" 0.5 Ml Misc (Insulin syringe-needle u-100) .... To inject insulin twice daily 5)  Freestyle Test Strp (Glucose blood) .... 4 times daily 6)  Promethazine Hcl 25 Mg Tabs (Promethazine hcl) .... Take 1 tablet by mouth four times a day as needed for nausea. 7)  Prodigy Blood Glucose Monitor Devi (Blood glucose monitoring suppl) .... Check cbg three times a day 8)  Prodigy Blood Glucose Test Strp (Glucose blood) .... Check cbg two times a day 9)  Prodigy  Lancets 28g Misc (Lancets) .... Check sugars three times a day 10)  Pravastatin Sodium 20 Mg Tabs (Pravastatin sodium) .... Once daily at bedtime 11)  Ibuprofen 400 Mg Tabs (Ibuprofen) .... Take 1 tablet by mouth four times a day as needed for pain  Other Orders: Diabetic Clinic Referral (Diabetic)  Patient Instructions: 1)  Please schedule a follow-up appointment in 1 week. 2)  Make an appointment with Al Decant Prescriptions: PRODIGY LANCETS 28G  MISC (LANCETS) check sugars three times a day  #2 boxes x 3   Entered and Authorized by:   Vertell Limber MD   Signed by:   Vertell Limber MD on 06/14/2010   Method used:   Electronically to        [facility] [phone]* (retail)       [address]       [place], [place]  [address]       Ph: [phone]       Fax: [phone]   [id number] PRODIGY BLOOD GLUCOSE TEST  STRP (GLUCOSE BLOOD) check CBG two times a day  #2 boxes x 5   Entered and Authorized by:   Vertell Limber MD   Signed by:   Vertell Limber MD on 06/14/2010   Method used:   Electronically to        [facility] [phone]* (retail)       [address]       [place], [place]  [address]       Ph:  [phone]       Fax: [phone]   RxID:   [id number]    Prevention & Chronic Care Immunizations   Influenza vaccine: Not documented   Influenza vaccine deferral: Deferred  (08/20/2009)    Tetanus booster: Not documented   Td booster deferral: Deferred  (09/25/2009)    Pneumococcal vaccine: Not documented  Other Screening   Pap smear: Not documented   Pap smear action/deferral: GYN Referral  (08/20/2009)   Smoking status: current  (06/14/2010)   Smoking cessation counseling: yes  (06/14/2010)  Diabetes Mellitus   HgbA1C: >14.0  (06/14/2010)    Eye exam: Not documented   Diabetic eye exam action/deferral: Ophthalmology referral  (06/14/2010)    Foot exam: yes  (02/26/2009)   High risk foot: Not documented   Foot care education: Not documented    Urine microalbumin/creatinine ratio: 3.8  (08/20/2009)   Urine microalbumin action/deferral: Ordered    Diabetes flowsheet reviewed?: Yes   Progress toward A1C goal: Deteriorated  Lipids   Total Cholesterol: 187  (02/26/2009)   Lipid panel action/deferral: Refused   LDL: 118  (02/26/2009)   LDL Direct: Not documented   HDL: 48  (02/26/2009)   Triglycerides: 104  (02/26/2009)    SGOT (AST): 13  (05/08/2007)   SGPT (ALT): 13  (05/08/2007)   Alkaline phosphatase: 104  (05/08/2007)   Total bilirubin: 0.4  (05/08/2007)    Lipid flowsheet reviewed?: Yes   Progress toward LDL goal: Unchanged  Self-Management Support :   Personal Goals (by the next clinic visit) :     Personal A1C goal: 7  (09/25/2009)     Personal blood pressure goal: 130/80  (09/25/2009)     Personal LDL goal: 70  (09/25/2009)    Patient will work on the following items until the next clinic visit to reach self-care goals:     Medications and monitoring: take my medicines every day, bring all of my medications to every visit,  examine my feet every day  (06/14/2010)     Eating: eat more vegetables, use fresh or frozen vegetables, eat baked foods  instead of fried foods, eat fruit for snacks and desserts  (06/14/2010)     Activity: take a 30 minute walk every day  (06/14/2010)    Diabetes self-management support: Written self-care plan, Education handout, Resources for patients handout  (06/14/2010)   Diabetes care plan printed   Diabetes education handout printed   Referred for diabetes self-mgmt training.   Last medical nutrition therapy: 05/09/2007    Lipid self-management support: Written self-care plan, Education handout, Resources for patients handout  (06/14/2010)   Lipid self-care plan printed.   Lipid education handout printed      Resource handout printed.   Nursing Instructions: Refer for screening diabetic eye exam (see order)   Laboratory Results   Blood Tests   Date/Time Received: June 14, 2010 9:57 AM Date/Time Reported: Lenoria Farrier  June 14, 2010 9:57 AM   HGBA1C: >14.0%   (Normal Range: Non-Diabetic - 3-6%   Control Diabetic - 6-8%) CBG Random:: 487mg /dL  Comments: CBG of 487 reported to Pamelia Hoit RN by Catalina Antigua, PBT  Lenoria Farrier  June 14, 2010 9:59 AM         PPD Application    Vaccine Type: PPD    Site: left forearm    Mfr: [place]    Dose: 0.1 ml    Route: ID    Given by: Hilda Blades Ditzler RN    Exp. Date: 09/16/2011    Lot #: [id number]

## 2010-12-16 NOTE — Assessment & Plan Note (Signed)
Summary: ACUTE-OUT OF MEDS/CFB(tobbia)   Vital Signs:  Patient profile:   35 year old female Height:      64 inches (162.56 cm) Weight:      218.1 pounds (99.14 kg) BMI:     37.57 Temp:     99.0 degrees F (37.22 degrees C) oral Pulse rate:   84 / minute BP sitting:   108 / 64  (right arm) Cuff size:   large  Vitals Entered By: Mateo Flow Deborra Medina) (March 09, 2010 9:20 AM) CC: med refill on insulin,  Is Patient Diabetic? Yes Did you bring your meter with you today? No Pain Assessment Patient in pain? yes     Location: left side flank pain Intensity: 7 Type: aching Onset of pain  off and on for 2wks Nutritional Status BMI of > 30 = obese CBG Result 417  Have you ever been in a relationship where you felt threatened, hurt or afraid?No   Does patient need assistance? Functional Status Self care Ambulation Normal   Primary Care Provider:  Vertell Limber MD  CC:  med refill on insulin and .  History of Present Illness: 35 yo F, with PMH of asthma, DM, HTN, HLP, presents to the Clinic for left sided rib pain. She said this happened gradually about 2 weeks ago and has been no change, no injury, dull pain, no radiation, no N/V/D, nothing makes it change. She has took ibuprofen for one dose and it helps. She has run out of her insulin for 2-3 days and could not afford it. She has no dysuria or diarrhea.  No fever, chills,  CP, or SOB.  She only takes insulin, no other medications.   Preventive Screening-Counseling & Management  Alcohol-Tobacco     Alcohol type: occasionally     Smoking Status: quit     Smoking Cessation Counseling: yes     Packs/Day: <0.25  Problems Prior to Update: 1)  Infection, Toe  (ICD-686.9) 2)  Diabetes Mellitus, Type II, Uncontrolled  (ICD-250.02) 3)  Hyperlipidemia  (ICD-272.4) 4)  Essential Hypertension, Benign  (ICD-401.1) 5)  Uri  (ICD-465.9) 6)  Excessive or Frequent Menstruation  (ICD-626.2) 7)  Diabetes Mellitus, Type II   (ICD-250.00) 8)  Asthma  (ICD-493.90) 9)  Asthma Nos w/o Status Asthmaticus  (ICD-493.90)  Medications Prior to Update: 1)  Relion 70/30 70-30 % Susp (Insulin Isophane & Regular) .... Take 28 Units in The Morning and 28 Units in The Evening 2)  Proventil 90 Mcg/act  Aers (Albuterol) .... 2 Puffs As Needed For Shortness of Breath 3)  Afrin Sinus 0.05 % Soln (Oxymetazoline Hcl) .... 2-3 Sprays in Each Nostril Two Times A Day For 3 Days. 4)  Bd Insulin Syringe Ultrafine 31g X 5/16" 0.5 Ml  Misc (Insulin Syringe-Needle U-100) .... To Inject Insulin Twice Daily 5)  Freestyle Test   Strp (Glucose Blood) .... 4 Times Daily 6)  Promethazine Hcl 25 Mg Tabs (Promethazine Hcl) .... Take 1 Tablet By Mouth Four Times A Day As Needed For Nausea. 7)  Prodigy Blood Glucose Monitor  Devi (Blood Glucose Monitoring Suppl) 8)  Prodigy Blood Glucose Test  Strp (Glucose Blood) .... Check Cbg Two Times A Day 9)  Prodigy Lancets 28g  Misc (Lancets) 10)  Pravastatin Sodium 20 Mg Tabs (Pravastatin Sodium) .... Once Daily At Bedtime  Current Medications (verified): 1)  Relion 70/30 70-30 % Susp (Insulin Isophane & Regular) .... Take 28 Units in The Morning and 28 Units in The Evening 2)  Proventil 90 Mcg/act  Aers (Albuterol) .... 2 Puffs As Needed For Shortness of Breath 3)  Afrin Sinus 0.05 % Soln (Oxymetazoline Hcl) .... 2-3 Sprays in Each Nostril Two Times A Day For 3 Days. 4)  Bd Insulin Syringe Ultrafine 31g X 5/16" 0.5 Ml  Misc (Insulin Syringe-Needle U-100) .... To Inject Insulin Twice Daily 5)  Freestyle Test   Strp (Glucose Blood) .... 4 Times Daily 6)  Promethazine Hcl 25 Mg Tabs (Promethazine Hcl) .... Take 1 Tablet By Mouth Four Times A Day As Needed For Nausea. 7)  Prodigy Blood Glucose Monitor  Devi (Blood Glucose Monitoring Suppl) 8)  Prodigy Blood Glucose Test  Strp (Glucose Blood) .... Check Cbg Two Times A Day 9)  Prodigy Lancets 28g  Misc (Lancets) 10)  Pravastatin Sodium 20 Mg Tabs (Pravastatin  Sodium) .... Once Daily At Bedtime  Allergies (verified): No Known Drug Allergies  Past History:  Past Medical History: Last updated: 05/08/2007 Asthma Diabetes mellitus  Family History: Last updated: 02/26/2009 Mother: stroke at age 58 Father: diabetes, died  Social History: Last updated: 02/26/2009 Lives with kids, currently not working.   Risk Factors: Smoking Status: quit (03/09/2010) Packs/Day: <0.25 (03/09/2010)  Family History: Reviewed history from 02/26/2009 and no changes required. Mother: stroke at age 87 Father: diabetes, died  Social History: Reviewed history from 02/26/2009 and no changes required. Lives with kids, currently not working.   Review of Systems  The patient denies anorexia, fever, chest pain, dyspnea on exertion, prolonged cough, hemoptysis, abdominal pain, melena, hematochezia, and severe indigestion/heartburn.    Physical Exam  General:  alert, well-developed, well-nourished, well-hydrated, and overweight-appearing.   Nose:  no nasal discharge.   Mouth:  pharynx pink and moist.   Neck:  supple.   Chest Wall:  left lower rib tenderness on palpation near junction of cartilage and bone. Lungs:  normal respiratory effort, normal breath sounds, no crackles, and no wheezes.   Heart:  normal rate, regular rhythm, no murmur, and no gallop.   Abdomen:  soft, non-tender, normal bowel sounds, and no distention.   Msk:  normal ROM, no joint tenderness, no joint swelling, and no joint warmth.   Pulses:  2+ Extremities:  No edema.  Neurologic:  alert & oriented X3, cranial nerves II-XII intact, strength normal in all extremities, sensation intact to light touch, and DTRs symmetrical and normal.     Impression & Recommendations:  Problem # 1:  RIB PAIN, LEFT SIDED (ICD-786.50) Assessment New This may be due to costochondritis or lower rib pain syndrome. Her pain responds to ibuprofen, so will not check X-Ray, but give ibuprofen for about 1 week.  If no improvement, may need X-Ray.  Problem # 2:  DIABETES MELLITUS, TYPE II, UNCONTROLLED (ICD-250.02) She has run out her insulin and did not check CBG regularly and A1C 12. Because she could not afford her insulin, will give her insulin sample and she needs to see Clarice Pole or CSW for financial help, also have referral to [facility] Harries for DM education.  Her updated medication list for this problem includes:    Relion 70/30 70-30 % Susp (Insulin isophane & regular) .Marland Kitchen... Take 28 units in the morning and 28 units in the evening  Labs Reviewed: Creat: 0.86 (08/20/2009)    Reviewed HgBA1c results: 12.0 (03/09/2010)  10.0 (08/20/2009)  Orders: Diabetic Clinic Referral (Diabetic)  Problem # 3:  HYPERLIPIDEMIA (ICD-272.4) Assessment: Unchanged She has not taken her statin for financial problems and refuse to check FLP. advised  weight loss, healthy diet and exercise.  Her updated medication list for this problem includes:    Pravastatin Sodium 20 Mg Tabs (Pravastatin sodium) ..... Once daily at bedtime  Labs Reviewed: SGOT: 13 (05/08/2007)   SGPT: 13 (05/08/2007)   HDL:48 (02/26/2009), 43 (05/08/2007)  LDL:118 (02/26/2009), 145 (05/08/2007)  Chol:187 (02/26/2009), 230 (05/08/2007)  Trig:104 (02/26/2009), 209 (05/08/2007)  Problem # 4:  ESSENTIAL HYPERTENSION, BENIGN (ICD-401.1) Assessment: Improved BP well controlled w/o any meds.  BP today: 108/64 Prior BP: 120/75 (09/25/2009)  Labs Reviewed: K+: 4.1 (08/20/2009) Creat: : 0.86 (08/20/2009)   Chol: 187 (02/26/2009)   HDL: 48 (02/26/2009)   LDL: 118 (02/26/2009)   TG: 104 (02/26/2009)  Complete Medication List: 1)  Relion 70/30 70-30 % Susp (Insulin isophane & regular) .... Take 28 units in the morning and 28 units in the evening 2)  Proventil 90 Mcg/act Aers (Albuterol) .... 2 puffs as needed for shortness of breath 3)  Afrin Sinus 0.05 % Soln (Oxymetazoline hcl) .... 2-3 sprays in each nostril two times a day for 3 days. 4)  Bd  Insulin Syringe Ultrafine 31g X 5/16" 0.5 Ml Misc (Insulin syringe-needle u-100) .... To inject insulin twice daily 5)  Freestyle Test Strp (Glucose blood) .... 4 times daily 6)  Promethazine Hcl 25 Mg Tabs (Promethazine hcl) .... Take 1 tablet by mouth four times a day as needed for nausea. 7)  Prodigy Blood Glucose Monitor Devi (Blood glucose monitoring suppl) 8)  Prodigy Blood Glucose Test Strp (Glucose blood) .... Check cbg two times a day 9)  Prodigy Lancets 28g Misc (Lancets) 10)  Pravastatin Sodium 20 Mg Tabs (Pravastatin sodium) .... Once daily at bedtime 11)  Ibuprofen 400 Mg Tabs (Ibuprofen) .... Take 1 tablet by mouth four times a day as needed for pain  Other Orders: T-Hgb A1C (in-house) [id number]) T- Capillary Blood Glucose [id number])  Patient Instructions: 1)  Please schedule a follow-up appointment in 2 months. 2)  You need to lose weight. Consider a lower calorie diet and regular exercise.  3)  Check your blood sugars regularly. If your readings are usually above : or below 70 you should contact our office. 4)  Check your feet each night for sore areas, calluses or signs of infection. Prescriptions: IBUPROFEN 400 MG TABS (IBUPROFEN) Take 1 tablet by mouth four times a day as needed for pain  #40 x 1   Entered and Authorized by:   Geanie Kenning MD   Signed by:   Geanie Kenning MD on 03/09/2010   Method used:   Electronically to        [facility] [phone]* (retail)       [address]       [place], [place]  [address]       Ph: [phone]       Fax: [phone]   RxID:   [id number]    Prevention & Chronic Care Immunizations   Influenza vaccine: Not documented   Influenza vaccine deferral: Deferred  (08/20/2009)    Tetanus booster: Not documented   Td booster deferral: Deferred  (09/25/2009)    Pneumococcal vaccine: Not documented  Other Screening   Pap smear: Not documented   Pap smear action/deferral: GYN Referral  (08/20/2009)   Smoking  status: quit  (03/09/2010)  Diabetes Mellitus   HgbA1C: 12.0  (03/09/2010)    Eye exam: Not documented   Diabetic eye exam action/deferral: Ophthalmology referral  (08/20/2009)    Foot exam: yes  (02/26/2009)   High  risk foot: Not documented   Foot care education: Not documented    Urine microalbumin/creatinine ratio: 3.8  (08/20/2009)   Urine microalbumin action/deferral: Ordered    Diabetes flowsheet reviewed?: Yes   Progress toward A1C goal: Deteriorated  Lipids   Total Cholesterol: 187  (02/26/2009)   Lipid panel action/deferral: Refused   LDL: 118  (02/26/2009)   LDL Direct: Not documented   HDL: 48  (02/26/2009)   Triglycerides: 104  (02/26/2009)    SGOT (AST): 13  (05/08/2007)   SGPT (ALT): 13  (05/08/2007)   Alkaline phosphatase: 104  (05/08/2007)   Total bilirubin: 0.4  (05/08/2007)    Lipid flowsheet reviewed?: Yes   Progress toward LDL goal: Improved  Hypertension   Last Blood Pressure: 108 / 64  (03/09/2010)   Serum creatinine: 0.86  (08/20/2009)   Serum potassium 4.1  (08/20/2009)    Hypertension flowsheet reviewed?: Yes   Progress toward BP goal: At goal  Self-Management Support :   Personal Goals (by the next clinic visit) :     Personal A1C goal: 7  (09/25/2009)     Personal blood pressure goal: 130/80  (09/25/2009)     Personal LDL goal: 70  (09/25/2009)    Patient will work on the following items until the next clinic visit to reach self-care goals:     Medications and monitoring: take my medicines every day  (03/09/2010)     Eating: eat foods that are low in salt, eat baked foods instead of fried foods  (03/09/2010)     Activity: join a walking program  (03/09/2010)    Diabetes self-management support: Pre-printed educational material, Resources for patients handout, Written self-care plan  (03/09/2010)   Diabetes care plan printed   Referred for diabetes self-mgmt training.   Last medical nutrition therapy: 05/09/2007    Hypertension  self-management support: Pre-printed educational material, Resources for patients handout, Written self-care plan  (03/09/2010)   Hypertension self-care plan printed.    Lipid self-management support: Pre-printed educational material, Resources for patients handout, Written self-care plan  (03/09/2010)   Lipid self-care plan printed.      Resource handout printed.   Laboratory Results   Blood Tests   Date/Time Received: March 09, 2010 9:51 AM Date/Time Reported: Maryan Rued  March 09, 2010 9:51 AM  HGBA1C: 12.0%   (Normal Range: Non-Diabetic - 3-6%   Control Diabetic - 6-8%) CBG Random:: 417mg /dL

## 2011-01-20 ENCOUNTER — Encounter: Payer: Self-pay | Admitting: Internal Medicine

## 2011-01-28 LAB — GLUCOSE, CAPILLARY: Glucose-Capillary: 487 mg/dL — ABNORMAL HIGH (ref 70–99)

## 2011-02-02 LAB — GLUCOSE, CAPILLARY: Glucose-Capillary: 337 mg/dL — ABNORMAL HIGH (ref 70–99)

## 2011-02-18 LAB — COMPREHENSIVE METABOLIC PANEL
Alkaline Phosphatase: 69 U/L (ref 39–117)
BUN: 10 mg/dL (ref 6–23)
Chloride: 108 mEq/L (ref 96–112)
Glucose, Bld: 120 mg/dL — ABNORMAL HIGH (ref 70–99)
Potassium: 3.9 mEq/L (ref 3.5–5.1)
Total Bilirubin: 0.6 mg/dL (ref 0.3–1.2)

## 2011-02-18 LAB — DIFFERENTIAL
Basophils Absolute: 0 K/uL (ref 0.0–0.1)
Basophils Relative: 0 % (ref 0–1)
Eosinophils Absolute: 0.1 K/uL (ref 0.0–0.7)
Eosinophils Relative: 2 % (ref 0–5)
Lymphocytes Relative: 31 % (ref 12–46)
Lymphs Abs: 2 K/uL (ref 0.7–4.0)
Monocytes Absolute: 0.5 K/uL (ref 0.1–1.0)
Monocytes Relative: 8 % (ref 3–12)
Neutro Abs: 3.8 K/uL (ref 1.7–7.7)
Neutrophils Relative %: 59 % (ref 43–77)

## 2011-02-18 LAB — POCT CARDIAC MARKERS
CKMB, poc: 1.6 ng/mL (ref 1.0–8.0)
CKMB, poc: 2.2 ng/mL (ref 1.0–8.0)
Myoglobin, poc: 110 ng/mL (ref 12–200)
Myoglobin, poc: 79.3 ng/mL (ref 12–200)
Troponin i, poc: <0.05 ng/mL (ref 0.00–0.09)
Troponin i, poc: <0.05 ng/mL (ref 0.00–0.09)

## 2011-02-18 LAB — CBC
HCT: 32.4 % — ABNORMAL LOW (ref 36.0–46.0)
Hemoglobin: 10.5 g/dL — ABNORMAL LOW (ref 12.0–15.0)
MCHC: 32.3 g/dL (ref 30.0–36.0)
MCV: 78.8 fL (ref 78.0–100.0)
Platelets: 284 K/uL (ref 150–400)
RBC: 4.1 MIL/uL (ref 3.87–5.11)
RDW: 16.2 % — ABNORMAL HIGH (ref 11.5–15.5)
WBC: 6.4 K/uL (ref 4.0–10.5)

## 2011-02-18 LAB — URINALYSIS, ROUTINE W REFLEX MICROSCOPIC
Nitrite: NEGATIVE
Protein, ur: NEGATIVE mg/dL
Urobilinogen, UA: 0.2 mg/dL (ref 0.0–1.0)

## 2011-02-18 LAB — URINE MICROSCOPIC-ADD ON

## 2011-02-18 LAB — GLUCOSE, CAPILLARY
Glucose-Capillary: 260 mg/dL — ABNORMAL HIGH (ref 70–99)
Glucose-Capillary: 142 mg/dL — ABNORMAL HIGH (ref 70–99)

## 2011-02-19 LAB — CBC
Hemoglobin: 10.4 g/dL — ABNORMAL LOW (ref 12.0–15.0)
RBC: 4.13 MIL/uL (ref 3.87–5.11)
RDW: 15.8 % — ABNORMAL HIGH (ref 11.5–15.5)

## 2011-02-19 LAB — GLUCOSE, CAPILLARY

## 2011-02-19 LAB — POCT I-STAT, CHEM 8
BUN: 13 mg/dL (ref 6–23)
Calcium, Ion: 1.18 mmol/L (ref 1.12–1.32)
Chloride: 104 mEq/L (ref 96–112)
Potassium: 4.2 mEq/L (ref 3.5–5.1)

## 2011-02-19 LAB — DIFFERENTIAL
Basophils Absolute: 0 10*3/uL (ref 0.0–0.1)
Lymphocytes Relative: 39 % (ref 12–46)
Monocytes Absolute: 0.8 10*3/uL (ref 0.1–1.0)
Neutro Abs: 3.8 10*3/uL (ref 1.7–7.7)

## 2011-02-19 LAB — URINALYSIS, ROUTINE W REFLEX MICROSCOPIC
Glucose, UA: >1000 mg/dL — AB
Ketones, ur: NEGATIVE mg/dL
Leukocytes, UA: NEGATIVE
Specific Gravity, Urine: >1.046 — ABNORMAL HIGH (ref 1.005–1.030)
pH: 5.5 (ref 5.0–8.0)

## 2011-02-19 LAB — URINE MICROSCOPIC-ADD ON

## 2011-02-23 LAB — POCT I-STAT, CHEM 8
HCT: 34 % — ABNORMAL LOW (ref 36.0–46.0)
Hemoglobin: 11.6 g/dL — ABNORMAL LOW (ref 12.0–15.0)
Potassium: 3.6 mEq/L (ref 3.5–5.1)
Sodium: 141 mEq/L (ref 135–145)

## 2011-02-23 LAB — DIFFERENTIAL
Basophils Relative: 0 % (ref 0–1)
Lymphocytes Relative: 14 % (ref 12–46)
Lymphs Abs: 0.8 10*3/uL (ref 0.7–4.0)
Monocytes Relative: 2 % — ABNORMAL LOW (ref 3–12)
Neutro Abs: 4.9 10*3/uL (ref 1.7–7.7)
Neutrophils Relative %: 83 % — ABNORMAL HIGH (ref 43–77)

## 2011-02-23 LAB — URINALYSIS, ROUTINE W REFLEX MICROSCOPIC
Nitrite: NEGATIVE
Specific Gravity, Urine: 1.011 (ref 1.005–1.030)
Urobilinogen, UA: 0.2 mg/dL (ref 0.0–1.0)

## 2011-02-23 LAB — CBC
HCT: 32.9 % — ABNORMAL LOW (ref 36.0–46.0)
Hemoglobin: 10.6 g/dL — ABNORMAL LOW (ref 12.0–15.0)
MCHC: 32.3 g/dL (ref 30.0–36.0)
MCV: 77.5 fL — ABNORMAL LOW (ref 78.0–100.0)
RBC: 4.25 MIL/uL (ref 3.87–5.11)
RDW: 14.6 % (ref 11.5–15.5)

## 2011-02-23 LAB — URINE CULTURE

## 2011-02-23 LAB — URINE MICROSCOPIC-ADD ON

## 2011-02-23 LAB — GLUCOSE, CAPILLARY

## 2011-02-24 LAB — GLUCOSE, CAPILLARY: Glucose-Capillary: 269 mg/dL — ABNORMAL HIGH (ref 70–99)

## 2011-03-01 LAB — HEMOGLOBIN A1C
Hgb A1c MFr Bld: 13.3 % — ABNORMAL HIGH (ref 4.6–6.1)
Mean Plasma Glucose: 335 mg/dL

## 2011-03-01 LAB — GLUCOSE, CAPILLARY
Glucose-Capillary: 183 mg/dL — ABNORMAL HIGH (ref 70–99)
Glucose-Capillary: 216 mg/dL — ABNORMAL HIGH (ref 70–99)

## 2011-03-01 LAB — COMPREHENSIVE METABOLIC PANEL
Alkaline Phosphatase: 90 U/L (ref 39–117)
BUN: 11 mg/dL (ref 6–23)
CO2: 27 mEq/L (ref 19–32)
Chloride: 105 mEq/L (ref 96–112)
Creatinine, Ser: 0.84 mg/dL (ref 0.4–1.2)
GFR calc non Af Amer: >60 mL/min (ref >60)
Glucose, Bld: 238 mg/dL — ABNORMAL HIGH (ref 70–99)
Total Bilirubin: 0.3 mg/dL (ref 0.3–1.2)

## 2011-03-01 LAB — POCT I-STAT, CHEM 8
BUN: 12 mg/dL (ref 6–23)
Creatinine, Ser: 0.8 mg/dL (ref 0.4–1.2)
Glucose, Bld: 407 mg/dL — ABNORMAL HIGH (ref 70–99)
Hemoglobin: 12.2 g/dL (ref 12.0–15.0)
Potassium: 3.9 mEq/L (ref 3.5–5.1)

## 2011-03-01 LAB — MICROALBUMIN / CREATININE URINE RATIO
Creatinine, Urine: 355 mg/dL
Microalb Creat Ratio: 222.6 mg/g — ABNORMAL HIGH (ref 0.0–30.0)
Microalb, Ur: 79.01 mg/dL — ABNORMAL HIGH (ref 0.00–1.89)

## 2011-03-01 LAB — URINE MICROSCOPIC-ADD ON

## 2011-03-01 LAB — URINALYSIS, ROUTINE W REFLEX MICROSCOPIC
Bilirubin Urine: NEGATIVE
Nitrite: NEGATIVE
Protein, ur: 100 mg/dL — AB
Specific Gravity, Urine: >1.046 — ABNORMAL HIGH (ref 1.005–1.030)
Urobilinogen, UA: 0.2 mg/dL (ref 0.0–1.0)

## 2011-03-01 LAB — LIPID PANEL
HDL: 39 mg/dL — ABNORMAL LOW (ref >39)
Total CHOL/HDL Ratio: 4.5 RATIO
Triglycerides: 117 mg/dL (ref <150)

## 2011-03-01 LAB — DIFFERENTIAL
Basophils Relative: 0 % (ref 0–1)
Eosinophils Absolute: 0.3 10*3/uL (ref 0.0–0.7)
Monocytes Absolute: 0.7 10*3/uL (ref 0.1–1.0)
Monocytes Relative: 10 % (ref 3–12)
Neutro Abs: 3.7 10*3/uL (ref 1.7–7.7)

## 2011-03-01 LAB — CARDIAC PANEL(CRET KIN+CKTOT+MB+TROPI)
Relative Index: 0.7 (ref 0.0–2.5)
Total CK: 162 U/L (ref 7–177)
Troponin I: <0.01 ng/mL (ref 0.00–0.06)
Troponin I: <0.01 ng/mL (ref 0.00–0.06)

## 2011-03-01 LAB — CBC
Hemoglobin: 11.3 g/dL — ABNORMAL LOW (ref 12.0–15.0)
MCHC: 33.8 g/dL (ref 30.0–36.0)
MCV: 80 fL (ref 78.0–100.0)
RBC: 4.18 MIL/uL (ref 3.87–5.11)

## 2011-03-01 LAB — PROTIME-INR: Prothrombin Time: 13.7 seconds (ref 11.6–15.2)

## 2011-03-29 NOTE — Discharge Summary (Signed)
NAME:  Jane [name], Jane [name]      ACCOUNT NO.:  [id number]   MEDICAL RECORD NO.:  [id number]           PATIENT TYPE:   LOCATION:                                 FACILITY:   PHYSICIAN:  C. Milta Deiters, M.D.DATE OF BIRTH:  [birth year]   DATE OF ADMISSION:  DATE OF DISCHARGE:                               DISCHARGE SUMMARY   DISCHARGE DIAGNOSES:  1. Upper respiratory infection.  2. Hyperglycemia.  3. Asthma.  4. Tobacco abuse.   DISCHARGE MEDICATIONS:  1. Tussionex 5 mL p.o. q.12 h. p.r.n. cough.  2. Doxycycline 100 mg p.o. b.i.d. x7 days.  3. Oxymetazoline 0.05% two to three sprays each nostril 2 times a day      for 3 days.  4. Proventil 90 mcg MDI 2 puffs q.6 h. p.r.n.  5. Metformin 500 mg p.o. b.i.d.  6. Glyburide 5 mg p.o. b.i.d.  7. Tylenol 500 mg 1 p.o. q.6 h. p.r.n. pain or fever.   DISPOSITION AND FOLLOWUP:  The patient will be called by the Outpatient  Clinic to set up a followup in 1-2 weeks.  At this time, the patient's  diabetes regimen should be gone over.  She will also need diabetes  education with Barnabas Harries setup.  The patient will also need to meet  with Bonna Gains for possible financial assistance for medications as  her financial situation has changed.  She will also need following up on  her asthma and her recent upper respiratory infection.  Medication  compliance should be stressed at this time to the patient.   PROCEDURES:  The patient had a chest x-ray on January 11, 2009.  Impression:  No acute findings.   There were no consultations.   CHIEF COMPLAINT:  Cold symptoms.   PRIMARY CARE Glendi Mohiuddin:  Darlyne Russian, MD   HISTORY OF PRESENT ILLNESS:  The patient is a 33-year woman with past  medical history significant for diabetes mellitus, type 2, uncontrolled;  asthma; obesity; tobacco abuse who presents to the ED with approximately  2-day history of cough with greenish sputum, pleuritic chest pain, runny  nose, and subjective fevers.  The  patient described the chest pain as  aching, associated with coughing, without radiation, and no prior  similar episodes.  She also reports she has not been taking her  medications for at least a month secondary to financial issues.  The  patient denies headache, blurred vision, dizziness, shortness of breath,  palpitations, abdominal pain, nausea, vomiting, diarrhea, diaphoresis,  dysuria, hematuria, melena, and hematochezia.   For allergies, past medical history, past surgical history, home  medications, substance history, social history, and family history,  please see hospital chart.   PHYSICAL EXAMINATION:  VITAL SIGNS:  Temperature equals 98.3, blood  pressure equals 133/79, pulse equals 103, respiratory rate equals 18, O2  sats equals 99% on room air.  GENERAL:  NAD.  HEENT:  Eyes PERRLA, EOMI.  ENT, dry mucous membranes.  No erythema.  NECK:  Supple.  No bruits, no LAD noted.  RESPIRATORY:  CTA bilaterally.  No rhonchi, rales, or wheezing.  CARDIOVASCULAR: S1 and S2, regular rate and rhythm.  No murmurs, rubs,  or gallops.  GASTROINTESTINAL:  Soft, nontender, positive bowel sounds.  No guarding.  No rebound.  EXTREMITIES:  No edema.  GENITOURINARY:  No CVA tenderness.  SKIN:  No jaundice.  No rashes.  LYMPHATIC:  No LAD in cervical region.  MUSCULOSKELETAL:  The patient moving all fours.  NEUROLOGIC:  Alert and oriented x3, nonfocal.  PSYCH:  Appropriate.   ADMISSION LABORATORY DATA:  Sodium 138, potassium 3.9, chloride 103,  bicarb 27, BUN 12, creatinine 0.8, glucose 407.  Anion gap of 8.  CBC,  WBC equals 7.4, hemoglobin equals 11.3, hematocrit equals 33.4,  platelets equals 274.  AST equals 3.7, MCV equals 80.0.  D-dimer 0.22.  Point-of-care markers, myoglobin equals 45.8, CK-MB less than 1.0,  troponin less than 0.05.   Chest x-ray, no acute findings.   HOSPITAL COURSE BY PROBLEM:  1. Hyperglycemia:  In the ED, the patient received 7 units of NovoLog      and  also her home doses of metformin and glyburide.  The patient      did not appear to be in DKA or HONK.  Her main issue is that she      needs to remain on her home medications with close followup, as      well as diabetes education with Barnabas Harries.  The patient will be      discharged on her home medications and will need close followup in      the Outpatient Clinic.  I suspect her hemoglobin A1c is markedly      elevated.  2. Upper respiratory infection:  The patient most likely has a viral      respiratory infection.  Given her history of asthma, we will put      her on antibiotic, doxycycline 100 mg p.o. b.i.d. x7 days.  We will      also give her prescription for her albuterol inhaler with      instructions to use.  The patient asthma seems to be well      controlled.  I do not think she needs inhaled steroids at this      time.  We leave up to her PCP who should readdress in the      outpatient setting.  The patient will also be given Afrin to be      used p.r.n. for 3 days for her nasal congestion.  We also gave      Tussionex for a short period for her cough.  The patient's      respiratory status should be reassessed at the time of followup.  3. Asthma:  As mentioned above.  4. Tobacco abuse:  The patient was counseled on smoking cessation      consult.  This should be readdressed in the outpatient setting,      especially given her history of asthma.   ADDITIONAL LABORATORY DATA DURING HOSPITAL STAY:  A cholesterol of 177,  triglycerides of 117, HDL equals 39, and LDL equals 115.  Given the  patient is diabetic, her LDL goal should be less than 100.  We will  leave this to be reassessed in the outpatient setting as the patient  really does not need admission at this time.  She would probably benefit  from starting a statin.  Also, we may need to consider adding an ACE  inhibitor given her being diabetic, but given her history of  noncompliance, it may not be feasible to  initiate several medications at  the  same time.  The patient also had cardiac enzymes that were negative  x1 in regards to troponin.  The patient had mildly elevated CK of 198.  Her next set of cardiac enzymes will be done at around 3:30 p.m. on day  of discharge.  I am anticipating these will be negative, and she should  be discharged without problem.  If enzymes happened to be elevated, we  will need a addendum to this discharge summary but I suspect this will  not be the case.   DISCHARGE VITAL SIGNS:  Temperature 98.3, blood pressure 133/79, pulse  103, respiratory rate 18, O2 sats 99% on room air.  Of note, I have  checked the patient's pulse and it is currently not tachycardic.      Katha Cabal, M.D.  Electronically Signed      C. Milta Deiters, M.D.  Electronically Signed    JY/MEDQ  D:  01/11/2009  T:  01/11/2009  Job:  [id number]   cc:   Darlyne Russian, MD

## 2011-04-01 NOTE — Discharge Summary (Signed)
NAMESYLINDA, [name] NO.:  [id number]   MEDICAL RECORD NO.:  [id number]          PATIENT TYPE:  INP   LOCATION:  [id number]                         FACILITY:  [facility]   PHYSICIAN:  Baxter Kail, M.D.     DATE OF BIRTH:  May 05, 1976   DATE OF ADMISSION:  02/08/2006  DATE OF DISCHARGE:  02/13/2006                                 DISCHARGE SUMMARY   DISCHARGE DIAGNOSES:  1.  Left axillary and left labial abscesses secondary to methicillin-      resistant Staphylococcus aureus.  2.  Diabetes mellitus type 2 diabetes, uncontrolled.  3.  Trichomonas.  4.  Dyslipidemia.  5.  Hypokalemia.   DISCHARGE MEDICATIONS:  1.  Glipizide 10 mg 1 tablet p.o. daily.  2.  Metformin 1000 mg 1 tablet p.o. b.i.d.  3.  Zocor 20 mg 1 tablet p.o. daily.  4.  Lantus 20 units subcutaneous q.h.s.  5.  Ibuprofen 400 mg q.6 h p.r.n.  6.  Phenergan 12.5 mg 1 tablet p.o. q.6 h p.r.n.  7.  Doxycycline 100 mg 1 tablet p.o. b.i.d. times 7 days.   CONDITION ON DISCHARGE:  The patient is much improved at the time of  discharge following I&D of her abscesses and antibiotic treatment.  She will  follow up with Dr. Bubba Camp of surgery regarding her left axillary wound.  She  should call [phone] to be seen in 2 weeks.  She will also follow up with  Dr. Will Bonnet in the outpatient clinic on February 21, 2006.  BMET should be  performed at that time to reevaluate her potassium.  Her CBGs, which she  should be recording at home, will be brought in at that visit and be  reviewed.  At that time, a decision should be made regarding continuation of  her antibiotic treatment and her home health plans regarding wound care.  Of  note, she desires to be a patient of [facility].  Will try to make her  an appointment at the time of her hospital follow-up so she can establish  care with them.   PROCEDURE:  The patient underwent I & D of her left axillary abscess on  March 28. 2007.   CONSULTATIONS:  Dr. Bubba Camp of  general surgery ([facility]) was consulted for I & D  of her left axillary abscess.   BRIEF ADMITTING HISTORY AND PHYSICAL:  [name] is a 35 year old African-  American female with a history of diabetes mellitus type 2 and medical  noncompliance who presented with 2 bumps on her axilla and her labia  majora.  She had noticed these bumps starting 6 days ago, and they have been  getting larger and more tender over the course of this time.  Both of them  did spontaneously begin to express pus, which led her to come into the  emergency room for treatment.  Notable findings on admission exam include a  left labia majora abscess with a 2 x 1 inch hard nonmobile fluctuant  abscess.  Nontender.  A drain had been placed by the ED physicians.  There  was drainage of pus and blood.  She also had a left axillary 2 x 2 inch  mobile fluctuant mass also draining pus and blood.  Exam was otherwise  unremarkable.  Admission labs included a white blood cell count of 9.1 with  an ANC of 5.5.  Admission labs were otherwise unremarkable with the  exception of a glucose of 375 on her basic metabolic panel.  Please see  admission H&P for full details.   HOSPITAL COURSE BY PROBLEM:  1.  Left axillary and left labial abscesses secondary to MRSA.  The patient      was admitted due to left axillary and left labial majora abscesses.  She      was begun on vancomycin and Unasyn.  Surgery was consulted for I&D.      They performed an I&D on the left axillary abscess, and cultures from      that I&D grew MRSA that was sensitive to vancomycin, Bactrim, and      tetracycline.  The wound was packed, and she underwent daily dressing      changes.  She remained afebrile and without leukocytosis.  She received      vancomycin and Zosyn for approximately 5 days, at which point her      antibiotics were changed to doxycycline after the culture results were      back.  She remained afebrile without leukocytosis on doxycycline, and       her abscesses became less tender.  Regarding her left labial abscess,      this abscess had spontaneously ruptured, and a drain was placed by the      ED physicians.  The drain spontaneously fell out in the middle of the      hospitalization.  By the time of the exam, the abscess had closed      spontaneously.  The patient underwent sitz baths and warm compresses.      The left labial mass was nonfluctuant and nontender and appeared to be      resolving on antibiotics.  Blood cultures during this hospitalization      were negative.  The patient was discharged home on doxycycline 100 mg      p.o. b.i.d. up for at least 1 week which she should continue until the      time of hospital follow-up with Dr. Will Bonnet.  She was also discharged      home with home health for dressing changes daily until February 14, 2006, at      which point she will receive dressing changes two to three times a week.      She will also follow up with Dr. Bubba Camp of surgery in 2 weeks regarding      her wound healing.  2.  Diabetes mellitus type 2 diabetes, previously uncontrolled.  The patient      was admitted with a CBG of 357.  A1c on admission was 15.2.  She      reported not having taken medications in over 1 year.  She was started      on Lantus 20 units subcutaneous q.h.s., Glucophage which was titrated up      to 1000 mg p.o. b.i.d., and she was also placed on Glucotrol 10 mg p.o.      daily.  She received diabetes education as an inpatient, and she was set      up for further outpatient education.  It was reinforced to her the  importance of controlling her diabetes, especially given her wounds.      She seems willing and motivated to control her diabetes.  3.  Trichomonas.  UA in the ED revealed trichomonads.  She was given Flagyl.      Other STDs were evaluated, but she was negative for GC, chlamydia, HIV      was nonreactive, RPR was nonreactive, and urine pregnancy test was     negative.  The patient  was told to tell her partners that they should be      treated for Trichomonas, as well.  4.  Dyslipidemia.  The patient came in with a history of dyslipidemia.  A      fasting lipid panel on admission showed a total cholesterol of 242, HDL      43, LDL 172, triglycerides 134.  She was begun on Pravachol 40 mg daily      and discharged home on Zocor 20 mg 1 tablet p.o. daily.  Her LFTs on      admission were normal.  These should be followed up in 1-2 months.   DISPOSITION:  The patient will follow up as mentioned previously with Dr.  Will Bonnet next week, at which point we will go over her wound, indication for  further antibiotics, her diabetes and a record of her CBGs, any further GYN  symptoms, and she will also receive a BMET to evaluate her potassium, as she  did have one episode of hypokalemia during her hospital stay.   DISCHARGE LABORATORIES AND VITALS:  There are no pending labs at the time of  this dictation.  Vital signs revealed a temperature 97.2, blood pressure  126/84, pulse of 82, respirations 20.  CBG of 73-146.  She was 100% on room  air.  Her last CBC showed a white count of 7.5, hemoglobin 11, platelets  286.  Her last BMET showed a sodium of 141, potassium 3.5, chloride 107,  bicarbonate 27, BUN 7, creatinine 0.9, glucose of 129.      Baxter Kail, M.D.     DC/MEDQ  D:  02/15/2006  T:  02/16/2006  Job:  [id number]   cc:   Outpatient Clinic   Kathrin Penner, M.D.  [id number]  [facility] [place] [address]

## 2011-07-04 ENCOUNTER — Other Ambulatory Visit: Payer: Self-pay | Admitting: Internal Medicine

## 2011-07-21 ENCOUNTER — Inpatient Hospital Stay (HOSPITAL_COMMUNITY)
Admission: EM | Admit: 2011-07-21 | Discharge: 2011-07-22 | DRG: 195 | Disposition: A | Discharge status: Home / Self Care | Payer: Self-pay | Attending: Internal Medicine | Admitting: Internal Medicine

## 2011-07-21 ENCOUNTER — Emergency Department (HOSPITAL_COMMUNITY): Payer: Self-pay

## 2011-07-21 ENCOUNTER — Encounter: Payer: Self-pay | Admitting: Internal Medicine

## 2011-07-21 DIAGNOSIS — J189 Pneumonia, unspecified organism: Principal | ICD-10-CM | POA: Diagnosis present

## 2011-07-21 DIAGNOSIS — E119 Type 2 diabetes mellitus without complications: Secondary | ICD-10-CM | POA: Diagnosis present

## 2011-07-21 DIAGNOSIS — R112 Nausea with vomiting, unspecified: Secondary | ICD-10-CM

## 2011-07-21 DIAGNOSIS — J45909 Unspecified asthma, uncomplicated: Secondary | ICD-10-CM | POA: Diagnosis present

## 2011-07-21 DIAGNOSIS — Z794 Long term (current) use of insulin: Secondary | ICD-10-CM

## 2011-07-21 LAB — CBC
HCT: 35.8 % — ABNORMAL LOW (ref 36.0–46.0)
Hemoglobin: 12.1 g/dL (ref 12.0–15.0)
MCH: 26.2 pg (ref 26.0–34.0)
MCV: 77.5 fL — ABNORMAL LOW (ref 78.0–100.0)
Platelets: 206 10*3/uL (ref 150–400)
RBC: 4.62 MIL/uL (ref 3.87–5.11)
WBC: 7.7 10*3/uL (ref 4.0–10.5)

## 2011-07-21 LAB — POCT I-STAT 3, ART BLOOD GAS (G3+)
Bicarbonate: 22.4 meq/L (ref 20.0–24.0)
O2 Saturation: 99 %
Patient temperature: 98.6
TCO2: 23 mmol/L (ref 0–100)
pCO2 arterial: 27.8 mmHg — ABNORMAL LOW (ref 35.0–45.0)
pH, Arterial: 7.514 — ABNORMAL HIGH (ref 7.350–7.400)
pO2, Arterial: 103 mmHg — ABNORMAL HIGH (ref 80.0–100.0)

## 2011-07-21 LAB — DIFFERENTIAL
Eosinophils Absolute: 0.1 10*3/uL (ref 0.0–0.7)
Lymphocytes Relative: 25 % (ref 12–46)
Lymphs Abs: 2 10*3/uL (ref 0.7–4.0)
Monocytes Relative: 9 % (ref 3–12)
Neutro Abs: 4.9 10*3/uL (ref 1.7–7.7)
Neutrophils Relative %: 64 % (ref 43–77)

## 2011-07-21 LAB — COMPREHENSIVE METABOLIC PANEL
AST: 16 U/L (ref 0–37)
CO2: 25 mEq/L (ref 19–32)
Calcium: 9.5 mg/dL (ref 8.4–10.5)
Chloride: 99 mEq/L (ref 96–112)
Creatinine, Ser: 0.8 mg/dL (ref 0.50–1.10)
GFR calc Af Amer: >60 mL/min (ref >60)
GFR calc non Af Amer: >60 mL/min (ref >60)
Glucose, Bld: 259 mg/dL — ABNORMAL HIGH (ref 70–99)
Total Bilirubin: 0.5 mg/dL (ref 0.3–1.2)

## 2011-07-21 LAB — GLUCOSE, CAPILLARY: Glucose-Capillary: 292 mg/dL — ABNORMAL HIGH (ref 70–99)

## 2011-07-21 NOTE — H&P (Signed)
Jane [name] is an 35 y.o. female.   Chief Complaint: headache and cough HPI: Pt presents with complaints of nausea, vomiting, cough and headache since 4 days prior to admission.  Her history is significant for insulin dependent diabetes mellitus, asthma, and morbid obesity.  Of note, she reports that she has not taken her insulin in over a week or so because she "ran out" of her medicine.  She denies chest pain, shortness of breath, changes in vision, blood in urine or stools.  Past Medical History  Diagnosis Date  . Asthma   . Diabetes mellitus   . Hyperlipidemia   . Tobacco abuse     No past surgical history on file.  Family History  Problem Relation Age of Onset  . Stroke Mother   . Diabetes Father    Social History:  reports that she has been smoking.  She does not have any smokeless tobacco history on file. She reports that she drinks alcohol. She reports that she does not use illicit drugs.  Allergies: No Known Allergies  No current facility-administered medications on file as of 07/21/2011.   Medications Prior to Admission  Medication Sig Dispense Refill  . albuterol (PROVENTIL,VENTOLIN) 90 MCG/ACT inhaler Inhale 2 puffs into the lungs as needed. For shortness of breath       . HUMULIN 70/30 (70-30) 100 UNIT/ML injection INJECT 30 UNITS SUBCUTANEOUSLY IN THE MORNING AND INJECT 30 UNITS IN THE EVENING  1 vial  9  . ibuprofen (ADVIL,MOTRIN) 400 MG tablet Take 400 mg by mouth 4 (four) times daily as needed. For pain       . pravastatin (PRAVACHOL) 40 MG tablet Take 40 mg by mouth daily.        . promethazine (PHENERGAN) 25 MG tablet Take 25 mg by mouth 4 (four) times daily as needed. For nausea       . pseudoephedrine (SUDAFED) 30 MG tablet Take 30 mg by mouth 3 (three) times daily as needed. For congestion         Results for orders placed during the hospital encounter of 07/21/11 (from the past 48 hour(s))  GLUCOSE, CAPILLARY     Status: Abnormal   Collection Time   07/21/11  10:59 AM      Component Value Range Comment   Glucose-Capillary 292 (*) 70 - 99 (mg/dL)   DIFFERENTIAL     Status: Normal   Collection Time   07/21/11 12:58 PM      Component Value Range Comment   Neutrophils Relative 64  43 - 77 (%)    Neutro Abs 4.9  1.7 - 7.7 (K/uL)    Lymphocytes Relative 25  12 - 46 (%)    Lymphs Abs 2.0  0.7 - 4.0 (K/uL)    Monocytes Relative 9  3 - 12 (%)    Monocytes Absolute 0.7  0.1 - 1.0 (K/uL)    Eosinophils Relative 2  0 - 5 (%)    Eosinophils Absolute 0.1  0.0 - 0.7 (K/uL)    Basophils Relative 0  0 - 1 (%)    Basophils Absolute 0.0  0.0 - 0.1 (K/uL)   CBC     Status: Abnormal   Collection Time   07/21/11 12:58 PM      Component Value Range Comment   WBC 7.7  4.0 - 10.5 (K/uL)    RBC 4.62  3.87 - 5.11 (MIL/uL)    Hemoglobin 12.1  12.0 - 15.0 (g/dL)    HCT 35.8 (*)  36.0 - 46.0 (%)    MCV 77.5 (*) 78.0 - 100.0 (fL)    MCH 26.2  26.0 - 34.0 (pg)    MCHC 33.8  30.0 - 36.0 (g/dL)    RDW 13.4  11.5 - 15.5 (%)    Platelets 206  150 - 400 (K/uL)   COMPREHENSIVE METABOLIC PANEL     Status: Abnormal   Collection Time   07/21/11 12:58 PM      Component Value Range Comment   Sodium 136  135 - 145 (mEq/L)    Potassium 4.0  3.5 - 5.1 (mEq/L)    Chloride 99  96 - 112 (mEq/L)    CO2 25  19 - 32 (mEq/L)    Glucose, Bld 259 (*) 70 - 99 (mg/dL)    BUN 12  6 - 23 (mg/dL)    Creatinine, Ser 0.80  0.50 - 1.10 (mg/dL)    Calcium 9.5  8.4 - 10.5 (mg/dL)    Total Protein 7.6  6.0 - 8.3 (g/dL)    Albumin 3.5  3.5 - 5.2 (g/dL)    AST 16  0 - 37 (U/L)    ALT 12  0 - 35 (U/L)    Alkaline Phosphatase 114  39 - 117 (U/L)    Total Bilirubin 0.5  0.3 - 1.2 (mg/dL)    GFR calc non Af Amer >60  >60 (mL/min)    GFR calc Af Amer >60  >60 (mL/min)   LIPASE, BLOOD     Status: Normal   Collection Time   07/21/11 12:58 PM      Component Value Range Comment   Lipase 15  11 - 59 (U/L)   POCT I-STAT 3, BLOOD GAS (G3+)     Status: Abnormal   Collection Time   07/21/11  2:09 PM       Component Value Range Comment   pH, Arterial 7.514 (*) 7.350 - 7.400     pCO2 arterial 27.8 (*) 35.0 - 45.0 (mmHg)    pO2, Arterial 103.0 (*) 80.0 - 100.0 (mmHg)    Bicarbonate 22.4  20.0 - 24.0 (mEq/L)    TCO2 23  0 - 100 (mmol/L)    O2 Saturation 99.0      Patient temperature 98.6 F      Collection site RADIAL, ALLEN'S TEST ACCEPTABLE      Drawn by RT      Sample type ARTERIAL      Dg Abd Acute W/chest  07/21/2011  *RADIOLOGY REPORT*  Clinical Data: Nausea, vomiting, cough and fever.  ACUTE ABDOMEN SERIES (ABDOMEN 2 VIEW & CHEST 1 VIEW)  Comparison: PA and lateral chest 01/11/2009.  Findings: Single view of the chest demonstrates left lower lobe airspace disease consistent with pneumonia.  Right lung is clear. Heart size is normal.  No pneumothorax or pleural effusion.  Two views of the abdomen show no free intraperitoneal air.  There is no evidence of bowel obstruction.  Moderate stool burden noted.  IMPRESSION:  1.  Left lower lobe airspace disease consistent with pneumonia. 2.  No acute finding in the abdomen.  Moderate stool burden noted.  Original Report Authenticated By: Arvid Right. Luther Parody, M.D.    Review of Systems  Constitutional: Positive for malaise/fatigue. Negative for chills and diaphoresis.       Reports subjective fever but did not measure with thermometer  HENT: Negative for sore throat.   Eyes: Negative for blurred vision.  Respiratory: Positive for cough. Negative for hemoptysis, sputum production, shortness  of breath and wheezing.   Cardiovascular: Negative for chest pain, palpitations and leg swelling.  Gastrointestinal: Positive for nausea and vomiting. Negative for heartburn, abdominal pain, diarrhea, constipation and blood in stool.  Genitourinary: Negative for dysuria and hematuria.  Musculoskeletal: Negative for myalgias.  Skin: Negative for rash.  Neurological: Positive for headaches. Negative for dizziness.    Vitals: T: 99.6      Bp 95/60    HR:  89    RR  15  O2 sat 97%  Physical Exam   General: Vital signs reviewed and noted. Well-developed, well-nourished, in no acute distress; alert, appropriate and cooperative throughout examination. Head: Normocephalic, atraumatic. Eyes: PERRL, EOMI, No signs of anemia or jaundince. Nose: Mucous membranes moist, not inflammed, nonerythematous. Throat: Oropharynx nonerythematous, no exudate appreciated.  Neck: No deformities, masses, or tenderness noted.Supple, No carotid Bruits, no JVD. Lungs: Normal respiratory effort. Clear to auscultation BL without crackles or wheezes. Heart: RRR. S1 and S2 normal without gallop, murmur, or rubs. Abdomen: BS normoactive. Soft, Nondistended, non-tender. No masses or organomegaly. Extremities: No pretibial edema. Neurologic: nonfocal    Assessment/Plan  1. Likely Community Acquired Pneumonia: will treat with Avelox 400mg  IV daily  2. Insulin Diabetes-Mellitus: will cover hyperglycemia with sliding scale insulin -- pt's last HgbA1c in Feb 2010 at 13.3,  check basic metabolic panel and urinalysis to assess kidney function --will also plan for Barnabas Harries (Diabetic Educator) to consult with the patient  3. Nausea/Vomiting:  Will hydrate with IVF NS 125cc/hr, Zofran for nausea, suspect symptoms may be be partially due to hyperglycemia  4. Asthma: stable without inhalers per patient report, will monitor and treat accordingly  Jonathon Castelo 07/21/2011, 6:31 PM

## 2011-07-22 ENCOUNTER — Telehealth: Payer: Self-pay | Admitting: Dietician

## 2011-07-22 LAB — CBC
HCT: 31.8 % — ABNORMAL LOW (ref 36.0–46.0)
Hemoglobin: 10.5 g/dL — ABNORMAL LOW (ref 12.0–15.0)
MCH: 25.5 pg — ABNORMAL LOW (ref 26.0–34.0)
MCHC: 33 g/dL (ref 30.0–36.0)
RDW: 13.2 % (ref 11.5–15.5)

## 2011-07-22 LAB — BASIC METABOLIC PANEL
CO2: 23 mEq/L (ref 19–32)
Calcium: 8.4 mg/dL (ref 8.4–10.5)
Creatinine, Ser: 0.75 mg/dL (ref 0.50–1.10)
GFR calc non Af Amer: >60 mL/min (ref >60)
Glucose, Bld: 193 mg/dL — ABNORMAL HIGH (ref 70–99)

## 2011-07-22 LAB — MRSA PCR SCREENING: MRSA by PCR: NEGATIVE

## 2011-07-22 NOTE — Telephone Encounter (Signed)
Contacted patient after being contacted by Dr. Michail Sermon, on the inpatient service about patient.   Ayli reports that she had run out of insulin, lost her medicaid and could not get refills, thus stopped taking her insulin. She is also out of test strips.   She was not aware that insulin can be bought without a prescription. She plans to reapply for [place] Medicaid and to apply for the [facility] card until then to be able to afford her medicine. She says she has 25.00 to purchase a vial of insulin from [facility] today. She was informed of her hospital follow-up appointment this Wednesday at 3:15 PM. At that time she will be given information on how to apply for the [facility] orange card. She was not interested in CDE visiting her in the hospital.

## 2011-07-27 ENCOUNTER — Encounter: Payer: Self-pay | Admitting: Internal Medicine

## 2011-08-10 LAB — WET PREP, GENITAL: Yeast Wet Prep HPF POC: NONE SEEN

## 2011-08-10 LAB — POCT PREGNANCY, URINE
Operator id: 118971
Preg Test, Ur: NEGATIVE

## 2011-08-10 LAB — GC/CHLAMYDIA PROBE AMP, GENITAL: Chlamydia, DNA Probe: NEGATIVE

## 2011-08-11 ENCOUNTER — Emergency Department (HOSPITAL_COMMUNITY)
Admission: EM | Admit: 2011-08-11 | Discharge: 2011-08-12 | Disposition: A | Discharge status: Home / Self Care | Payer: Self-pay | Attending: Emergency Medicine | Admitting: Emergency Medicine

## 2011-08-11 DIAGNOSIS — E119 Type 2 diabetes mellitus without complications: Secondary | ICD-10-CM | POA: Insufficient documentation

## 2011-08-11 DIAGNOSIS — R5381 Other malaise: Secondary | ICD-10-CM | POA: Insufficient documentation

## 2011-08-11 DIAGNOSIS — R197 Diarrhea, unspecified: Secondary | ICD-10-CM | POA: Insufficient documentation

## 2011-08-11 DIAGNOSIS — Z794 Long term (current) use of insulin: Secondary | ICD-10-CM | POA: Insufficient documentation

## 2011-08-11 DIAGNOSIS — R630 Anorexia: Secondary | ICD-10-CM | POA: Insufficient documentation

## 2011-08-11 DIAGNOSIS — R42 Dizziness and giddiness: Secondary | ICD-10-CM | POA: Insufficient documentation

## 2011-08-11 DIAGNOSIS — R112 Nausea with vomiting, unspecified: Secondary | ICD-10-CM | POA: Insufficient documentation

## 2011-08-11 DIAGNOSIS — J189 Pneumonia, unspecified organism: Secondary | ICD-10-CM | POA: Insufficient documentation

## 2011-08-11 LAB — GLUCOSE, CAPILLARY: Glucose-Capillary: 314 mg/dL — ABNORMAL HIGH (ref 70–99)

## 2011-08-12 ENCOUNTER — Emergency Department (HOSPITAL_COMMUNITY): Payer: Self-pay

## 2011-08-12 LAB — DIFFERENTIAL
Eosinophils Absolute: 0.1 10*3/uL (ref 0.0–0.7)
Eosinophils Relative: 1 % (ref 0–5)
Lymphocytes Relative: 20 % (ref 12–46)
Lymphs Abs: 1.5 10*3/uL (ref 0.7–4.0)
Monocytes Absolute: 0.8 10*3/uL (ref 0.1–1.0)

## 2011-08-12 LAB — URINE MICROSCOPIC-ADD ON

## 2011-08-12 LAB — URINALYSIS, ROUTINE W REFLEX MICROSCOPIC
Glucose, UA: >1000 mg/dL — AB
Leukocytes, UA: NEGATIVE
Protein, ur: NEGATIVE mg/dL
Specific Gravity, Urine: >1.046 — ABNORMAL HIGH (ref 1.005–1.030)
pH: 5.5 (ref 5.0–8.0)

## 2011-08-12 LAB — COMPREHENSIVE METABOLIC PANEL
AST: 10 U/L (ref 0–37)
Albumin: 3.5 g/dL (ref 3.5–5.2)
BUN: 11 mg/dL (ref 6–23)
Calcium: 8.9 mg/dL (ref 8.4–10.5)
Chloride: 106 mEq/L (ref 96–112)
Creatinine, Ser: 0.92 mg/dL (ref 0.50–1.10)
Total Protein: 6.8 g/dL (ref 6.0–8.3)

## 2011-08-12 LAB — CBC
HCT: 37.1 % (ref 36.0–46.0)
MCHC: 32.9 g/dL (ref 30.0–36.0)
MCV: 78.3 fL (ref 78.0–100.0)
Platelets: 216 10*3/uL (ref 150–400)
RDW: 13.2 % (ref 11.5–15.5)
WBC: 7.5 10*3/uL (ref 4.0–10.5)

## 2011-08-14 NOTE — Discharge Summary (Signed)
[name], [name] NO.:  [id number]  MEDICAL RECORD NO.:  [id number]  LOCATION:  [id number]                         FACILITY:  [facility]  PHYSICIAN:  Karren Cobble, MD     DATE OF BIRTH:  1976-01-02  DATE OF ADMISSION:  07/21/2011 DATE OF DISCHARGE:  07/22/2011                              DISCHARGE SUMMARY   DISCHARGE DIAGNOSES: 1. Community-acquired pneumonia. 2. Diabetes mellitus, insulin dependent. 3. Nausea and vomiting. 4. Asthma.  DISCHARGE MEDICATIONS: 1. NovoLog insulin 70/30, 40 units in the morning and 40 units in the     evening. 2. Azithromycin 500 mg x1 and 250 mg daily x4 days. 3. Phenergan 12.5 mg p.o. q.6 h p.r.n. for nausea.  DISPOSITION AND FOLLOWUP:  Jane [name] was discharged in stable condition with follow up appointment with Dr. Criss Alvine, October 5, at 2:30.  At that time, Jane [name] should meet with Barnabas Harries, diabetic educator, and Bonna Gains for financial assistance with medications.  Medication compliance should be stressed at that time as well.  PROCEDURES:  July 21, 2011, acute abdomen series, Impression; left lower lobe air space disease consistent with pneumonia, no acute findings in the abdomen.  Moderate stool burden was noted.  CONSULTATIONS:  None.  BRIEF ADMITTING HISTORY AND PHYSICAL:  Jane [name] presented with complaints of nausea, vomiting, cough, and headache over 4 days prior to admission.  Her history is significant for insulin-dependent diabetes mellitus, asthma, and morbid obesity.  Of note, she reports that she has not been taking her medication in over a week because she "ran out".   She denied chest pain, shortness of breath, change in vision, blood in  her urine or stool.  PHYSICAL EXAMINATION: VITAL SIGNS:  Temperature was 99.6, blood pressure 95/60, heart rate 89,  respirations 15, oxygen saturation 97%. GENERAL:  Well developed, well nourished, in no acute distress, alert, appropriate, and  cooperative to examination with occasional cough. HEENT:  Normocephalic, atraumatic.  PERLA, EOMI, no signs of anemia or  jaundice.  Moist mucous membranes.  No erythema.  Throat clear. NECK:  No deformities or masses.  Supple.  No carotid bruits or JVD. LUNGS:  Clear to auscultation bilaterally without crackles or wheezes. HEART:  Regular rate and rhythm.  S1 and S2 normal without gallops, murmurs, or rubs. ABDOMEN:  Obese, soft, nondistended, and nontender.  No masses appreciated. EXTREMITIES:  No pretibial edema. NEUROLOGIC:  Nonfocal.  ADMITTING LABORATORY DATA: White blood cell count 7.7, hemoglobin 12.1, hematocrit 35.8, MCV 77.5,  platelets 206.  Sodium 136, potassium 4.0, chloride 99, bicarbonate 25,  BUN 12, creatinine 0.8, glucose 259, calcium 9.5.  Total protein 7.6, albumin 3.5, AST 16, ALT 12, alkaline phosphatase 114, total bilirubin 0.5, eGFR > 60.  Lipase 15.  RA arterial blood gas pH 7.51, PCO2 28,  PO2 103.  HOSPITAL COURSE BY PROBLEM: 1. Community-acquired pneumonia.  We treated her with Avelox 400 mg IV      daily and converted her to azithromycin by the time of discharge.  2. Insulin-dependent diabetes.  We covered her hyperglycemia with     sliding scale insulin.  She is poorly compliant.  The last  hemoglobin A1c in August 2011 which was greater than 14.0.  We     checked a basic metabolic panel and urinalysis to assess her kidney     function and consulted with Barnabas Harries, diabetic educator for the     [facility] while she was inpatient.  3. Nausea and vomiting.  We continued to hydrate Jane [name] with IV     fluids, normal saline 125 mL per hour, controlled her nausea with     Zofran which was replaced with Phenergan p.o. and suspected that     her symptoms may be partially due to hyperglycemia.  4. Her asthma was stable without inhalers per patient report.  We     continued to monitor her and she showed no signs of respiratory     distress.  DISPOSITION:   Her primary care physician is Dr. Vanessa Kick, but she will follow up with Dr. Criss Alvine in [facility] in October approximately 4-6 weeks where a chest x-ray should be repeated to assess the resolution of her  infiltrate.  She should also meet with Bonna Gains for financial  assistance with her medications.  DISCHARGE LABORATORY DATA:  White blood cell count 5.8, hemoglobin 10.5, hematocrit 31.8, platelets 220.  DISCHARGE VITAL SIGNS:  Respiration 20, temperature 98.7, blood pressure  148/98, RA O2 Sat 97%.   ______________________________ Dorian Heckle, MD   ______________________________ Karren Cobble, MD   KS/MEDQ  D:  07/22/2011  T:  07/23/2011  Job:  [id number]  cc:   Vertell Limber, MD  Electronically Signed by Dorian Heckle MD on 08/01/2011 01:08:49 PM Electronically Signed by Oval Linsey  on 08/14/2011 03:41:40 PM

## 2011-08-16 LAB — POCT PREGNANCY, URINE: Preg Test, Ur: NEGATIVE

## 2011-08-16 LAB — URINALYSIS, ROUTINE W REFLEX MICROSCOPIC
Bilirubin Urine: NEGATIVE
Hgb urine dipstick: NEGATIVE
Ketones, ur: NEGATIVE
Nitrite: NEGATIVE
Specific Gravity, Urine: <1.005 — ABNORMAL LOW
Urobilinogen, UA: 0.2

## 2011-08-16 LAB — WET PREP, GENITAL: Yeast Wet Prep HPF POC: NONE SEEN

## 2011-08-16 LAB — GC/CHLAMYDIA PROBE AMP, GENITAL: GC Probe Amp, Genital: POSITIVE — AB

## 2011-08-18 LAB — GLUCOSE, CAPILLARY: Glucose-Capillary: 304 mg/dL — ABNORMAL HIGH (ref 70–99)

## 2011-08-18 LAB — POCT I-STAT, CHEM 8
Glucose, Bld: 467 mg/dL — ABNORMAL HIGH (ref 70–99)
HCT: 39 % (ref 36.0–46.0)
Hemoglobin: 13.3 g/dL (ref 12.0–15.0)
Potassium: 4.2 mEq/L (ref 3.5–5.1)
Sodium: 137 mEq/L (ref 135–145)

## 2011-08-18 LAB — URINALYSIS, ROUTINE W REFLEX MICROSCOPIC
Glucose, UA: >1000 mg/dL — AB
Hgb urine dipstick: NEGATIVE
Ketones, ur: NEGATIVE mg/dL
Protein, ur: NEGATIVE mg/dL
Urobilinogen, UA: 0.2 mg/dL (ref 0.0–1.0)

## 2011-08-18 LAB — WET PREP, GENITAL
Trich, Wet Prep: NONE SEEN
Yeast Wet Prep HPF POC: NONE SEEN

## 2011-08-18 LAB — GC/CHLAMYDIA PROBE AMP, GENITAL
Chlamydia, DNA Probe: NEGATIVE
GC Probe Amp, Genital: NEGATIVE

## 2011-08-18 LAB — DIFFERENTIAL
Lymphocytes Relative: 38 % (ref 12–46)
Lymphs Abs: 2.7 10*3/uL (ref 0.7–4.0)
Neutro Abs: 3.5 10*3/uL (ref 1.7–7.7)
Neutrophils Relative %: 50 % (ref 43–77)

## 2011-08-18 LAB — CBC
Platelets: 283 10*3/uL (ref 150–400)
RBC: 4.6 MIL/uL (ref 3.87–5.11)
WBC: 7 10*3/uL (ref 4.0–10.5)

## 2011-08-18 LAB — RPR: RPR Ser Ql: NONREACTIVE

## 2011-08-19 ENCOUNTER — Encounter: Payer: Self-pay | Admitting: Internal Medicine

## 2011-08-31 ENCOUNTER — Encounter: Payer: Self-pay | Admitting: Internal Medicine

## 2011-08-31 ENCOUNTER — Ambulatory Visit (INDEPENDENT_AMBULATORY_CARE_PROVIDER_SITE_OTHER): Payer: Self-pay | Admitting: Internal Medicine

## 2011-08-31 DIAGNOSIS — E119 Type 2 diabetes mellitus without complications: Secondary | ICD-10-CM

## 2011-08-31 NOTE — Progress Notes (Deleted)
  Subjective:    Patient ID: Jane [name], female    DOB: [birth year], 35 y.o.   MRN: [id number]  HPI    Review of Systems     Objective:   Physical Exam        Assessment & Plan:  1. DM, insulin-dependent. -needs to speak with Marlana Latus in regards's to orange card. -sample of Novolog 70/30  30 Units Sq q12 Sig#1 vial -needs a glucometer -weight management -diet, exercise, foot care addressed

## 2011-09-01 ENCOUNTER — Encounter: Payer: Self-pay | Admitting: Internal Medicine

## 2011-09-01 NOTE — Progress Notes (Signed)
HPI:   Past Medical History  Diagnosis Date  . Asthma   . Diabetes mellitus   . Hyperlipidemia   . Tobacco abuse    Current Outpatient Prescriptions  Medication Sig Dispense Refill  . albuterol (PROVENTIL,VENTOLIN) 90 MCG/ACT inhaler Inhale 2 puffs into the lungs as needed. For shortness of breath       . HUMULIN 70/30 (70-30) 100 UNIT/ML injection INJECT 30 UNITS SUBCUTANEOUSLY IN THE MORNING AND INJECT 30 UNITS IN THE EVENING  1 vial  9  . ibuprofen (ADVIL,MOTRIN) 400 MG tablet Take 400 mg by mouth 4 (four) times daily as needed. For pain       . pravastatin (PRAVACHOL) 40 MG tablet Take 40 mg by mouth daily.        . promethazine (PHENERGAN) 25 MG tablet Take 25 mg by mouth 4 (four) times daily as needed. For nausea       . pseudoephedrine (SUDAFED) 30 MG tablet Take 30 mg by mouth 3 (three) times daily as needed. For congestion        Family History  Problem Relation Age of Onset  . Stroke Mother   . Diabetes Father    History   Social History  . Marital Status: Legally Separated    Spouse Name: N/A    Number of Children: N/A  . Years of Education: N/A   Social History Main Topics  . Smoking status: Former Smoker    Types: Cigarettes    Quit date: 09/02/2010  . Smokeless tobacco: None  . Alcohol Use: Yes     occassionally  . Drug Use: No  . Sexually Active: None   Other Topics Concern  . None   Social History Narrative  . None    Review of Systems: Constitutional: Denies fever, chills, diaphoresis, appetite change and fatigue.  HEENT: Denies photophobia, eye pain, redness, hearing loss, ear pain, congestion, sore throat, rhinorrhea, sneezing, mouth sores, trouble swallowing, neck pain, neck stiffness and tinnitus.  Respiratory: Denies SOB, DOE, cough, chest tightness, and wheezing.  Cardiovascular: Denies chest pain, palpitations and leg swelling.  Gastrointestinal: Denies nausea, vomiting, abdominal pain, diarrhea, constipation, blood in stool and abdominal  distention.  Genitourinary: Denies dysuria, urgency, frequency, hematuria, flank pain and difficulty urinating.  Musculoskeletal: Denies myalgias, back pain, joint swelling, arthralgias and gait problem.  Skin: Denies pallor, rash and wound.  Neurological: Denies dizziness, seizures, syncope, weakness, light-headedness, numbness and headaches.  Hematological: Denies adenopathy. Easy bruising, personal or family bleeding history  Psychiatric/Behavioral: Denies suicidal ideation, mood changes, confusion, nervousness, sleep disturbance and agitation   Vitals: reviewed General: alert, well-developed, and cooperative to examination.  Head: normocephalic and atraumatic.  Eyes: vision grossly intact, pupils equal, pupils round, pupils reactive to light, no injection and anicteric.  Mouth: pharynx pink and moist, no erythema, and no exudates.  Neck: supple, full ROM, no thyromegaly, no JVD, and no carotid bruits.  Lungs: normal respiratory effort, no accessory muscle use, normal breath sounds, no crackles, and no wheezes. Heart: normal rate, regular rhythm, no murmur, no gallop, and no rub.  Abdomen: soft, non-tender, normal bowel sounds, no distention, no guarding, no rebound tenderness, no hepatomegaly, and no splenomegaly.  Msk: no joint swelling, no joint warmth, and no redness over joints.  Pulses: 2+ DP/PT pulses bilaterally Extremities: No cyanosis, clubbing, edema Neurologic: alert & oriented X3, cranial nerves II-XII intact, strength normal in all extremities, sensation intact to light touch, and gait normal.  Skin: turgor normal and no rashes.  Psych:  Oriented X3, memory intact for recent and remote, normally interactive, good eye contact, not anxious appearing, and not depressed appearing.    Assessment & Plan:  1. DM, insulin-dependent->uncontroleed due to patient's lack of financial resources, and noncompliance with diet and exercise. -needs to speak with Marlana Latus in regards's to  orange card. -sample of Novolog 70/30  30 Units Sq q12 Sig#1 vial -S:S of hypoglycemia reviewed with the patient. -refused referral for DME   "because does not want to be told what to eat." -needs a glucometer -weight management -diet, exercise, foot care addressed F/u in 2-4 weeks and call with any questions in meantime.

## 2011-10-17 ENCOUNTER — Ambulatory Visit (INDEPENDENT_AMBULATORY_CARE_PROVIDER_SITE_OTHER): Payer: Self-pay | Admitting: Internal Medicine

## 2011-10-17 ENCOUNTER — Encounter: Payer: Self-pay | Admitting: Internal Medicine

## 2011-10-17 VITALS — BP 120/77 | HR 86 | Temp 97.9°F | Ht 65.0 in | Wt 227.5 lb

## 2011-10-17 DIAGNOSIS — F17200 Nicotine dependence, unspecified, uncomplicated: Secondary | ICD-10-CM

## 2011-10-17 DIAGNOSIS — E119 Type 2 diabetes mellitus without complications: Secondary | ICD-10-CM

## 2011-10-17 DIAGNOSIS — E785 Hyperlipidemia, unspecified: Secondary | ICD-10-CM

## 2011-10-17 DIAGNOSIS — IMO0001 Reserved for inherently not codable concepts without codable children: Secondary | ICD-10-CM

## 2011-10-17 LAB — POCT GLYCOSYLATED HEMOGLOBIN (HGB A1C): Hemoglobin A1C: 12.2

## 2011-10-17 MED ORDER — LISINOPRIL 5 MG PO TABS: 5.0000 mg | ORAL_TABLET | Freq: Every day | ORAL | Status: DC

## 2011-10-17 MED ORDER — PRAVASTATIN SODIUM 40 MG PO TABS: 40.0000 mg | ORAL_TABLET | Freq: Every day | ORAL | Status: DC

## 2011-10-17 NOTE — Progress Notes (Signed)
  Subjective:    Patient ID: Jane [name], female    DOB: October 26, 1976, 35 y.o.   MRN: [id number]  HPI  Patient is a 35 year old female with a past medical history listed below, presents to the outpatient clinic for diabetic followup, patient does not have a meter but reports that she is "working on it". Reports compliance with her medication and says that she is taking insulin 7030 30 units in the morning 30 units at night, despite this her A1c today's 12.2. Patient reports that she's not taking her cholesterol or blood pressure medications as prescribed. Appears to have poor insight into her disease, and refuses to see our diabetic educator. I have discussed with her at length today her treatment options and complications that may occur if her diabetes is not under better control. She denies any other complaints  Patient Active Problem List  Diagnoses  . DIABETES MELLITUS, TYPE II  . DIABETES MELLITUS, TYPE II, UNCONTROLLED  . HYPERLIPIDEMIA  . TOBACCO ABUSE  . Unspecified Asthma   Current Outpatient Prescriptions on File Prior to Visit  Medication Sig Dispense Refill  . HUMULIN 70/30 (70-30) 100 UNIT/ML injection INJECT 30 UNITS SUBCUTANEOUSLY IN THE MORNING AND INJECT 30 UNITS IN THE EVENING  1 vial  9   No Known Allergies   Review of Systems  All other systems reviewed and are negative.       Objective:   Physical Exam  Vitals reviewed. Constitutional: She is oriented to person, place, and time. She appears well-developed and well-nourished.  HENT:  Head: Normocephalic and atraumatic.  Eyes: Pupils are equal, round, and reactive to light.  Neck: Normal range of motion. Neck supple. No JVD present. No thyromegaly present.  Cardiovascular: Normal rate, regular rhythm and normal heart sounds.   No murmur heard. Pulmonary/Chest: Effort normal and breath sounds normal. She has no wheezes. She has no rales.  Abdominal: Soft. Bowel sounds are normal.  Musculoskeletal: Normal range  of motion. She exhibits no edema.  Neurological: She is alert and oriented to person, place, and time.  Skin: Skin is warm and dry.          Assessment & Plan:

## 2011-10-17 NOTE — Assessment & Plan Note (Signed)
Patient A1c today is 12.2 this is improved from her last A1c, however this still poorly controlled we'll increase her insulin to 40 units in the day 30 at night, advised patient to do meter followup in 2 weeks for further adjustments. During that visit we will discuss the rest of her health maintenance. the patient on lisinopril for renal protection given her poorly controlled diabetes we'll check a basic metabolic panel next followup

## 2011-10-17 NOTE — Patient Instructions (Signed)
Increase your insulin to 40 units in the morning and 30 units at night Take your cholesterol medication called pravastatin daily at bed time Take your kidney medication lisinopril daily

## 2011-10-17 NOTE — Assessment & Plan Note (Signed)
Start the patient on pravastatin and recheck cholesterol in at least 4 weeks

## 2011-10-17 NOTE — Assessment & Plan Note (Signed)
Patient was counseled on smoking cessation strategies including medications and behavior modification options. Patient said she was not ready to stop smoking at this time.

## 2011-10-23 ENCOUNTER — Emergency Department (HOSPITAL_COMMUNITY)
Admission: EM | Admit: 2011-10-23 | Discharge: 2011-10-23 | Disposition: A | Discharge status: Home / Self Care | Payer: Self-pay | Attending: Emergency Medicine | Admitting: Emergency Medicine

## 2011-10-23 ENCOUNTER — Emergency Department (HOSPITAL_COMMUNITY): Payer: Self-pay

## 2011-10-23 ENCOUNTER — Encounter (HOSPITAL_COMMUNITY): Payer: Self-pay | Admitting: *Deleted

## 2011-10-23 DIAGNOSIS — R000 Tachycardia, unspecified: Secondary | ICD-10-CM | POA: Insufficient documentation

## 2011-10-23 DIAGNOSIS — R220 Localized swelling, mass and lump, head: Secondary | ICD-10-CM | POA: Insufficient documentation

## 2011-10-23 DIAGNOSIS — S0083XA Contusion of other part of head, initial encounter: Secondary | ICD-10-CM

## 2011-10-23 DIAGNOSIS — Z794 Long term (current) use of insulin: Secondary | ICD-10-CM | POA: Insufficient documentation

## 2011-10-23 DIAGNOSIS — J45909 Unspecified asthma, uncomplicated: Secondary | ICD-10-CM | POA: Insufficient documentation

## 2011-10-23 DIAGNOSIS — E119 Type 2 diabetes mellitus without complications: Secondary | ICD-10-CM | POA: Insufficient documentation

## 2011-10-23 DIAGNOSIS — S0003XA Contusion of scalp, initial encounter: Secondary | ICD-10-CM | POA: Insufficient documentation

## 2011-10-23 DIAGNOSIS — S1093XA Contusion of unspecified part of neck, initial encounter: Secondary | ICD-10-CM | POA: Insufficient documentation

## 2011-10-23 DIAGNOSIS — E785 Hyperlipidemia, unspecified: Secondary | ICD-10-CM | POA: Insufficient documentation

## 2011-10-23 DIAGNOSIS — R6884 Jaw pain: Secondary | ICD-10-CM | POA: Insufficient documentation

## 2011-10-23 DIAGNOSIS — R040 Epistaxis: Secondary | ICD-10-CM | POA: Insufficient documentation

## 2011-10-23 DIAGNOSIS — Z79899 Other long term (current) drug therapy: Secondary | ICD-10-CM | POA: Insufficient documentation

## 2011-10-23 MED ORDER — OXYMETAZOLINE HCL 0.05 % NA SOLN
2.0000 | Freq: Once | NASAL | Status: AC
  Administered 2011-10-23: 2 via NASAL
  Filled 2011-10-23: qty 15

## 2011-10-23 MED ORDER — IBUPROFEN 800 MG PO TABS
800.0000 mg | ORAL_TABLET | Freq: Once | ORAL | Status: AC
  Administered 2011-10-23: 800 mg via ORAL
  Filled 2011-10-23: qty 1
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
## 2011-10-31 ENCOUNTER — Encounter: Payer: Self-pay | Admitting: Internal Medicine

## 2011-11-04 ENCOUNTER — Other Ambulatory Visit: Payer: Self-pay | Admitting: Internal Medicine

## 2011-11-04 ENCOUNTER — Ambulatory Visit (INDEPENDENT_AMBULATORY_CARE_PROVIDER_SITE_OTHER): Payer: Self-pay | Admitting: Internal Medicine

## 2011-11-04 ENCOUNTER — Encounter: Payer: Self-pay | Admitting: Internal Medicine

## 2011-11-04 DIAGNOSIS — E785 Hyperlipidemia, unspecified: Secondary | ICD-10-CM

## 2011-11-04 DIAGNOSIS — F17200 Nicotine dependence, unspecified, uncomplicated: Secondary | ICD-10-CM

## 2011-11-04 DIAGNOSIS — E119 Type 2 diabetes mellitus without complications: Secondary | ICD-10-CM

## 2011-11-04 NOTE — Progress Notes (Signed)
Subjective:     Patient ID: Jane [name], female   DOB: 1976-04-10, 35 y.o.   MRN: [id number]  HPI  Patient is a 35 year old female with a past medical history listed below, presents to the outpatient clinic for routine 2 week followup to evaluate her glucometer trends, however the patient did not bring her glucometer today and said that she's working on getting a new one. Patient on last visit was started on pravastatin and lisinopril, she has not taken the pravastatin but endorses taking lisinopril. Denies any other complaints  Patient Active Problem List  Diagnoses  . DIABETES MELLITUS, TYPE II  . HYPERLIPIDEMIA  . TOBACCO ABUSE  . Unspecified Asthma   Current Outpatient Prescriptions on File Prior to Visit  Medication Sig Dispense Refill  . insulin NPH-insulin regular (NOVOLIN 70/30) (70-30) 100 UNIT/ML injection Inject 30-40 Units into the skin 2 (two) times daily with a meal. Takes 40 units in the morning and 30 units in the evening.       Marland Kitchen lisinopril (PRINIVIL,ZESTRIL) 5 MG tablet Take 1 tablet (5 mg total) by mouth daily.  30 tablet  11  . pravastatin (PRAVACHOL) 40 MG tablet Take 1 tablet (40 mg total) by mouth daily.  30 tablet  3   Not on File  Review of Systems  All other systems reviewed and are negative.       Objective:   Physical Exam  Nursing note and vitals reviewed. Constitutional: She is oriented to person, place, and time. She appears well-developed and well-nourished.  HENT:  Head: Normocephalic and atraumatic.  Eyes: Pupils are equal, round, and reactive to light.  Neck: Normal range of motion. Neck supple. No JVD present. No thyromegaly present.  Cardiovascular: Normal rate, regular rhythm and normal heart sounds.   No murmur heard. Pulmonary/Chest: Effort normal and breath sounds normal. She has no wheezes. She has no rales.  Abdominal: Soft. Bowel sounds are normal.  Musculoskeletal: Normal range of motion. She exhibits no edema.  Neurological: She  is alert and oriented to person, place, and time.  Skin: Skin is warm and dry.

## 2011-11-04 NOTE — Assessment & Plan Note (Signed)
Patient was counseled on smoking cessation strategies including medications and behavior modification options. Patient said she was not ready to stop smoking at this time.

## 2011-11-04 NOTE — Patient Instructions (Addendum)
Please come back in approximately one month when he had ureter and bring her meter, and please bring it with you a long with a logbook of her sugars so we can evaluate what to do with your insulin next

## 2011-11-04 NOTE — Assessment & Plan Note (Addendum)
Last LDL was 118 in 2010, patient was started on statin 2 weeks ago however she has not yet taken it, patient's goal is less than 100, will check fasting lipid today. Encouraged patient to take her statins as instructed

## 2011-11-04 NOTE — Assessment & Plan Note (Addendum)
Patient's diabetes is poorly controlled, she's currently taking insulin 70/30 at a dose of 30-40 units twice daily. Does not have her glucometer today therefore I'm unable to make any further adjustment, I advised the patient to get a new glucometer and bring it back in one month, and based on the trends we will be able to increase her insulin to optimally manage her diabetes.

## 2011-11-05 LAB — MICROALBUMIN / CREATININE URINE RATIO
Creatinine, Urine: 84.4 mg/dL
Microalb Creat Ratio: 41.9 mg/g — ABNORMAL HIGH (ref 0.0–30.0)
Microalb, Ur: 3.54 mg/dL — ABNORMAL HIGH (ref 0.00–1.89)

## 2011-11-05 LAB — COMPREHENSIVE METABOLIC PANEL
ALT: 9 U/L (ref 0–35)
AST: 14 U/L (ref 0–37)
Albumin: 4.2 g/dL (ref 3.5–5.2)
Alkaline Phosphatase: 79 U/L (ref 39–117)
Potassium: 4.3 mEq/L (ref 3.5–5.3)
Sodium: 138 mEq/L (ref 135–145)
Total Protein: 7.3 g/dL (ref 6.0–8.3)

## 2011-11-05 LAB — LIPID PANEL: LDL Cholesterol: 145 mg/dL — ABNORMAL HIGH (ref 0–99)

## 2012-06-12 ENCOUNTER — Encounter (HOSPITAL_COMMUNITY): Payer: Self-pay

## 2012-06-12 ENCOUNTER — Emergency Department (HOSPITAL_COMMUNITY)
Admission: EM | Admit: 2012-06-12 | Discharge: 2012-06-13 | Disposition: A | Discharge status: Home / Self Care | Payer: Medicaid Other | Attending: Emergency Medicine | Admitting: Emergency Medicine

## 2012-06-12 DIAGNOSIS — J45909 Unspecified asthma, uncomplicated: Secondary | ICD-10-CM | POA: Insufficient documentation

## 2012-06-12 DIAGNOSIS — E785 Hyperlipidemia, unspecified: Secondary | ICD-10-CM | POA: Insufficient documentation

## 2012-06-12 DIAGNOSIS — E119 Type 2 diabetes mellitus without complications: Secondary | ICD-10-CM | POA: Insufficient documentation

## 2012-06-12 DIAGNOSIS — R252 Cramp and spasm: Secondary | ICD-10-CM | POA: Insufficient documentation

## 2012-06-12 DIAGNOSIS — Z87891 Personal history of nicotine dependence: Secondary | ICD-10-CM | POA: Insufficient documentation

## 2012-06-12 DIAGNOSIS — Z794 Long term (current) use of insulin: Secondary | ICD-10-CM | POA: Insufficient documentation

## 2012-06-12 LAB — CBC
MCV: 77.8 fL — ABNORMAL LOW (ref 78.0–100.0)
Platelets: 227 10*3/uL (ref 150–400)
RDW: 14.1 % (ref 11.5–15.5)
WBC: 7.3 10*3/uL (ref 4.0–10.5)

## 2012-06-12 MED ORDER — SODIUM CHLORIDE 0.9 % IV SOLN
Freq: Once | INTRAVENOUS | Status: AC
  Administered 2012-06-12: 23:00:00 via INTRAVENOUS

## 2012-06-12 MED ORDER — DIAZEPAM 5 MG/ML IJ SOLN
5.0000 mg | Freq: Once | INTRAMUSCULAR | Status: AC
  Administered 2012-06-12: 5 mg via INTRAVENOUS
  Filled 2012-06-12: qty 2

## 2012-06-13 LAB — BASIC METABOLIC PANEL
Chloride: 99 mEq/L (ref 96–112)
Creatinine, Ser: 0.88 mg/dL (ref 0.50–1.10)
GFR calc Af Amer: >90 mL/min (ref >90)
Potassium: 3.8 mEq/L (ref 3.5–5.1)

## 2012-06-13 MED ORDER — KETOROLAC TROMETHAMINE 30 MG/ML IJ SOLN
30.0000 mg | Freq: Once | INTRAMUSCULAR | Status: AC
  Administered 2012-06-13: 30 mg via INTRAVENOUS
  Filled 2012-06-13: qty 1

## 2012-06-13 NOTE — ED Provider Notes (Signed)
Medical screening examination/treatment/procedure(s) were performed by non-physician practitioner and as supervising physician I was immediately available for consultation/collaboration.  Kalman Drape, MD 06/13/12 [phone]

## 2012-06-13 NOTE — ED Notes (Signed)
Pt interm dozing off - states IV valium eased cramping; however, they have recurred; Toradol given as ordered

## 2012-06-13 NOTE — ED Notes (Signed)
Pt ambulated with no assistance. Said she was a little sore but other then that she felt fine

## 2012-06-13 NOTE — ED Provider Notes (Signed)
History     CSN: [id number]  Arrival date & time 06/12/12  2224   None     Chief Complaint  Patient presents with  . Leg Pain    BLE pain/cramps since 2100 this PM; no known injury; reports decreased PO intake        (Consider location/radiation/quality/duration/timing/severity/associated sxs/prior treatment) HPI Comments: Patient states she was in bed tonight, when she had severe.  Muscle cramps in her legs and left hand.  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  All day and probably didn't drink enough fluids.  She has not been eating well lately, and she is slightly stressed.  She has not tried any medication, maneuvers, ice or heat to alleviate her symptoms  Patient is a 36 y.o. female presenting with leg pain. The history is provided by the patient.  Leg Pain  The incident occurred 3 to 5 hours ago. The incident occurred at home. There was no injury mechanism. The pain is present in the left leg and right leg. The quality of the pain is described as aching. The pain is at a severity of 5/10. The pain is moderate. The pain has been intermittent since onset. Pertinent negatives include no numbness, no loss of motion, no muscle weakness, no loss of sensation and no tingling. She reports no foreign bodies present. She has tried nothing for the symptoms.    Past Medical History  Diagnosis Date  . Asthma   . Diabetes mellitus   . Hyperlipidemia   . Tobacco abuse     History reviewed. No pertinent past surgical history.  Family History  Problem Relation Age of Onset  . Stroke Mother   . Diabetes Father     History  Substance Use Topics  . Smoking status: Former Smoker    Types: Cigarettes    Quit date: 09/02/2010  . Smokeless tobacco: Not on file  . Alcohol Use: Yes     occassionally    OB History    Grav Para Term Preterm Abortions TAB SAB Ect Mult Living                  Review of Systems  Constitutional: Negative for fever and chills.  Musculoskeletal: Positive  for myalgias. Negative for back pain and joint swelling.  Skin: Negative for rash and wound.  Neurological: Negative for dizziness, tingling and numbness.    Allergies  Review of patient's allergies indicates no known allergies.  Home Medications   Current Outpatient Rx  Name Route Sig Dispense Refill  . ALBUTEROL SULFATE HFA 108 (90 BASE) MCG/ACT IN AERS Inhalation Inhale 2 puffs into the lungs every 6 (six) hours as needed. wheezing    . INSULIN ISOPHANE & REGULAR (70-30) 100 UNIT/ML [place] SUSP Subcutaneous Inject 30 Units into the skin 2 (two) times daily with a meal. Takes 40 units in the morning and 30 units in the evening.      BP 137/82  Pulse 102  Temp 98.3 F (36.8 C) (Oral)  Resp 19  SpO2 99%  Physical Exam  Constitutional: She is oriented to person, place, and time. She appears well-developed and well-nourished.  HENT:  Head: Normocephalic.  Neck: Normal range of motion.  Cardiovascular: Tachycardia present.   Pulmonary/Chest: Effort normal.  Abdominal: Soft.  Musculoskeletal: Normal range of motion. She exhibits no edema and no tenderness.  Neurological: She is alert and oriented to person, place, and time.  Skin: Skin is warm and dry. No rash noted.  ED Course  Procedures (including critical care time)  Labs Reviewed  CBC - Abnormal; Notable for the following:    HCT 35.7 (*)     MCV 77.8 (*)     All other components within normal limits  BASIC METABOLIC PANEL - Abnormal; Notable for the following:    Glucose, Bld 246 (*)     GFR calc non Af Amer 83 (*)     All other components within normal limits   No results found.   1. Muscle cramps       MDM   Is in a severe muscle, cramps we'll check electrolytes, hydrate, treat with Valium for the muscle spasm, as well as Toradol for pain Patient has been up walking around still sore, but not having any more cramping        Garald Balding, NP 06/13/12 0200  Garald Balding, NP 06/13/12 0200

## 2012-06-13 NOTE — ED Notes (Signed)
Pt reports continued cramping to BLEs - NP made aware of same; instructed to apply heat pads to areas which are cramping - also instructed to ambulate pt

## 2012-09-25 ENCOUNTER — Encounter (HOSPITAL_COMMUNITY): Payer: Self-pay | Admitting: *Deleted

## 2012-09-25 ENCOUNTER — Emergency Department (HOSPITAL_COMMUNITY): Payer: Medicaid Other

## 2012-09-25 ENCOUNTER — Inpatient Hospital Stay (HOSPITAL_COMMUNITY)
Admission: EM | Admit: 2012-09-25 | Discharge: 2012-09-27 | DRG: 074 | Disposition: A | Discharge status: Home / Self Care | Payer: Medicaid Other | Attending: Internal Medicine | Admitting: Internal Medicine

## 2012-09-25 DIAGNOSIS — Z87891 Personal history of nicotine dependence: Secondary | ICD-10-CM

## 2012-09-25 DIAGNOSIS — R739 Hyperglycemia, unspecified: Secondary | ICD-10-CM | POA: Diagnosis present

## 2012-09-25 DIAGNOSIS — B3731 Acute candidiasis of vulva and vagina: Secondary | ICD-10-CM | POA: Diagnosis present

## 2012-09-25 DIAGNOSIS — E1149 Type 2 diabetes mellitus with other diabetic neurological complication: Principal | ICD-10-CM | POA: Diagnosis present

## 2012-09-25 DIAGNOSIS — E1142 Type 2 diabetes mellitus with diabetic polyneuropathy: Secondary | ICD-10-CM | POA: Diagnosis present

## 2012-09-25 DIAGNOSIS — Z794 Long term (current) use of insulin: Secondary | ICD-10-CM | POA: Diagnosis present

## 2012-09-25 DIAGNOSIS — E669 Obesity, unspecified: Secondary | ICD-10-CM

## 2012-09-25 DIAGNOSIS — E1165 Type 2 diabetes mellitus with hyperglycemia: Secondary | ICD-10-CM

## 2012-09-25 DIAGNOSIS — J45909 Unspecified asthma, uncomplicated: Secondary | ICD-10-CM

## 2012-09-25 DIAGNOSIS — E113493 Type 2 diabetes mellitus with severe nonproliferative diabetic retinopathy without macular edema, bilateral: Secondary | ICD-10-CM | POA: Diagnosis present

## 2012-09-25 DIAGNOSIS — E785 Hyperlipidemia, unspecified: Secondary | ICD-10-CM | POA: Diagnosis present

## 2012-09-25 DIAGNOSIS — B373 Candidiasis of vulva and vagina: Secondary | ICD-10-CM | POA: Diagnosis present

## 2012-09-25 LAB — CBC WITH DIFFERENTIAL/PLATELET
Basophils Absolute: 0 10*3/uL (ref 0.0–0.1)
Basophils Relative: 0 % (ref 0–1)
Hemoglobin: 13.1 g/dL (ref 12.0–15.0)
MCHC: 32.8 g/dL (ref 30.0–36.0)
Neutro Abs: 4.1 10*3/uL (ref 1.7–7.7)
Neutrophils Relative %: 54 % (ref 43–77)
RDW: 13 % (ref 11.5–15.5)

## 2012-09-25 LAB — URINALYSIS, ROUTINE W REFLEX MICROSCOPIC
Ketones, ur: NEGATIVE mg/dL
Leukocytes, UA: NEGATIVE
Nitrite: POSITIVE — AB
pH: 5.5 (ref 5.0–8.0)

## 2012-09-25 LAB — BASIC METABOLIC PANEL
Chloride: 97 mEq/L (ref 96–112)
GFR calc Af Amer: >90 mL/min (ref >90)
Potassium: 4.4 mEq/L (ref 3.5–5.1)
Sodium: 134 mEq/L — ABNORMAL LOW (ref 135–145)

## 2012-09-25 LAB — GLUCOSE, CAPILLARY: Glucose-Capillary: 354 mg/dL — ABNORMAL HIGH (ref 70–99)

## 2012-09-25 LAB — POCT PREGNANCY, URINE: Preg Test, Ur: NEGATIVE

## 2012-09-25 LAB — POCT I-STAT TROPONIN I

## 2012-09-25 LAB — URINE MICROSCOPIC-ADD ON

## 2012-09-25 MED ORDER — SODIUM CHLORIDE 0.9 % IV BOLUS (SEPSIS)
1000.0000 mL | Freq: Once | INTRAVENOUS | Status: AC
  Administered 2012-09-25: 1000 mL via INTRAVENOUS

## 2012-09-25 MED ORDER — SODIUM CHLORIDE 0.9 % IV SOLN
INTRAVENOUS | Status: DC
  Administered 2012-09-25: 2.9 [IU]/h via INTRAVENOUS
  Filled 2012-09-25: qty 1

## 2012-09-25 MED ORDER — DEXTROSE 5 % IV SOLN
1.0000 g | Freq: Once | INTRAVENOUS | Status: AC
  Administered 2012-09-25: 1 g via INTRAVENOUS
  Filled 2012-09-25: qty 10

## 2012-09-25 NOTE — H&P (Signed)
Hospital Admission Note Date: 09/25/2012  Patient name: Jane [name] Medical record number: [id number] Date of birth: August 12, 1976 Age: 36 y.o. Gender: female PCP: Santa Lighter, MD  Medical Service: Internal Medicine Teaching Service--[place]  Attending physician: Dr. Bertha Stakes    1st Contact: Dr. Eulas Post    [id number] 2nd Contact: Dr. Michail Sermon    [phone] After 5 pm or weekends: 1st Contact:      Pager: [phone] 2nd Contact:      Pager: [phone]  Chief Complaint: Nasal Congestion and fatigue  History of Present Illness: [name] is a 36 year old African American female with PMH of uncontrolled DM II (Hb A1c 12.16 October 2011), Asthma, and Hyperlipidema presented to the ED today from home with complaints of worsening nasal congestion, fatigue, and cough associated chest pain x1 week.  [name] claims started last Thursday 09/20/12 she noticed mild nasal congestion which was followed by 3 episodes of vomiting and diarrhea that spontaneously resolved that day.  Over the course of the week, her nasal congestion got worse and she developed a mild productive cough with white-yellow phlegm and substernal chest pain with coughing and movement.  She tried taking mucinex and anti congestion medicine at home to no relief.  Currently she does not have a cough, however, her chest pain is constant, 7/10, "achy" in nature, non radiating, tender to palpation, and exacerbated with movement.  She also admits to not taking her insulin--citing affordability as the main reason for non-adherence.  She is supposed to be on Novolog 70/30 with 40 units in AM and 30 units in PM.  Of note, she has not been seen in [facility] since last year.  [name] also endorses occasional numbness in her b/l feet, ankle swelling at times after standing or being at work all day, and multiple "yeast" infections with a description of pale-brownish clumps on skin of vagina and inside when her sugar "acts up".  She is also currently  complaining of headache/head pressure on top of her head that started with the nasal congestion.  She denies any vision disturbances, sore throat, fever, abdominal pain, palpitations, shortness of breath, dysuria, diarrhea, nausea, vomiting, or constipation at this time.    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  In the ED she was given rocephin 1g IV x1, NS bolus 1L, and negative pregnancy test.   Meds: Current Outpatient Rx  Name  Route  Sig  Dispense  Refill  . ALBUTEROL SULFATE HFA 108 (90 BASE) MCG/ACT IN AERS   Inhalation   Inhale 2 puffs into the lungs every 6 (six) hours as needed. wheezing          Allergies: Allergies as of 09/25/2012  . (No Known Allergies)   Past Medical History  Diagnosis Date  . Asthma   . Diabetes mellitus   . Hyperlipidemia   . Tobacco abuse    Past Surgical History  Procedure Date  . Tubal ligation   . Cesarean section    Family History  Problem Relation Age of Onset  . Stroke Mother   . Diabetes Father   . Diabetes Mother   . Diabetes Maternal Grandmother   . Diabetes Maternal Grandfather   . Hypertension Mother   . Hypertension Father   . Cancer Paternal Grandfather     Prostate  . Aneurysm  Mother    History   Social History  . Marital Status: Legally Separated    Spouse Name: N/A    Number of Children: N/A  . Years of Education: N/A   Occupational History  . Not on file.   Social History Main Topics  . Smoking status: Former Smoker -- 0.3 packs/day for 1 years    Types: Cigarettes    Quit date: 09/02/2010  . Smokeless tobacco: Not on file  . Alcohol Use: Yes     Comment: occassionally  . Drug Use: No  . Sexually Active: Not on file   Other Topics Concern  . Not on file   Social History Narrative  . No narrative on file   Review of Systems:  Constitutional:   Chills and fatigue. Denies fever, diaphoresis, appetite change.   HEENT:  Nasal congestion, headache.  Denies sore throat, rhinorrhea, sneezing, mouth sores, trouble swallowing, neck pain   Respiratory:  Occasional productive cough. Denies SOB, DOE, and wheezing.   Cardiovascular:  Chest pain with cough and palpation. Denies palpitations and leg swelling.   Gastrointestinal:  Vomiting x3 episodes and diarrhea last week. Denies nausea, abdominal pain, constipation, blood in stool and abdominal distention.   Genitourinary:  Frequency. Vaginal yeast. Denies dysuria, urgency, frequency, hematuria, flank pain and difficulty urinating.   Musculoskeletal:  B/l ankle swelling occasionally, b/l foot neuropathy. Denies myalgias, back pain, arthralgias and gait problem.   Skin:  Pink peeled skin around vagina. Dry. Denies pallor and wound.   Neurological:  Headaches. Denies dizziness, seizures, syncope, weakness, light-headedness, numbness.    Physical Exam: Blood pressure 104/55, pulse 92, temperature 98.1 F (36.7 C), temperature source Oral, resp. rate 19, last menstrual period 09/18/2012, SpO2 100.00%. Vitals reviewed. General: resting in bed, NAD HEENT: PERRLA, EOMI, left conjunctival red injection Cardiac: Tachycardia, no rubs, murmurs or gallops Pulm: clear to auscultation bilaterally, no wheezes, rales, or rhonchi Abd: soft, nontender, obese, nondistended, BS present, stretch marks, darkened discoloration to the left of abdominal midline lower quadrant.  Ext: warm and well perfused, dry skin, no pedal edema, non tender to palpation, +2 dp, left great toe nail half broke, darkened discoloration of right great toenail Neuro: alert and oriented X3, cranial nerves II-XII grossly intact, strength and sensation to light touch equal in bilateral upper and lower extremities GYN: pink peeled skin between thighs surrounding vagina, non tender to palpation, no discharge noted. Patient placed powder in area.      Lab results: Basic Metabolic Panel:  [facility] 09/25/12 1721  NA 134*  K 4.4  CL 97  CO2 26  GLUCOSE 517*  BUN 13  CREATININE 0.92  CALCIUM 9.2  MG --  PHOS --   CBC:  Basename 09/25/12 1721  WBC 7.5  NEUTROABS 4.1  HGB 13.1  HCT 39.9  MCV 79.0  PLT 257   CBG:  Basename 09/25/12 2341 09/25/12 2210  GLUCAP 272* 354*   Urinalysis:  Basename 09/25/12 1727  COLORURINE YELLOW  LABSPEC 1.040*  PHURINE 5.5  GLUCOSEU >1000*  HGBUR TRACE*  BILIRUBINUR NEGATIVE  KETONESUR NEGATIVE  PROTEINUR NEGATIVE  UROBILINOGEN 0.2  NITRITE POSITIVE*  LEUKOCYTESUR NEGATIVE   Imaging results:  Dg Chest 2 View  09/25/2012  *RADIOLOGY REPORT*  Clinical Data: Short of breath and chest pain  CHEST - 2 VIEW  Comparison: 08/12/2011  Findings: The heart size and mediastinal contours are within normal limits.  Both lungs are clear.  The visualized skeletal structures are unremarkable.  IMPRESSION: Negative examination.  Original Report Authenticated By: Kerby Moors, M.D.    Other results: EKG: 95bpm NSR  Assessment & Plan by Problem:  [name] is a 36 year old obese African American female with PMH of uncontrolled DM II, Asthma, and Hyperlipidema admitted for hyperglycemia.     Hyperglycemia without ketosis--in the setting of DM II , likely secondary to non-adherence with insulin at home.  Claims she is supposed to be on Novolog 70/30 40 units in AM and 30 units in PM.  Hb A1c 12.16 October 2011.  Glucose on admission 517.  U/A on admission: glucose >1000, positive nitrite, urobilinogen 0.2, trace Hb with 0-2 rbc and 3-6 wbc with many bacteria.   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  HbA1c 10/2011: 12.2.  Morbid obesity weight 228 lbs on admission.  Lipid panel from 10/2011: LDL 145, Chol 221, HDL 49, TG 137.  -same plan as above   Chest pain--likely musculoskeletal given reproducibility, tenderness to palpation, and exacerbated with position change.  ACS included in differential however unlikely (trop x1 negative, no acute ischemic changes noted on initial EKG.  PE is considered however geneva score of 3--low probability and wells score 0, and no leg pain or swelling and denies any recent travel hx or shortness of breath.  CXR negative or any acute changes including PNA or pneumothorax.  -continue to monitor -Toradol PRN pain control   Vaginal discharge--reports having "yeast infection" with brownish-white clumps on vaginal skin folds and inside. Hx of multiple episodes when "sugars not controlled".  Denies foul smelling discharge. LMP 1 week prior. U/A on admission: glucose >1000, positive nitrite, urobilinogen 0.2, trace Hb with 0-2 rbc and 3-6 wbc with many bacteria.  -KOH wet prep and G/C probe sent. Given Rocephin 1g IV x1 in ED.  -nystatin powder -diflucan 150mg  q72 hours for 3 doses -continue to monitor -HIV -f/u urine cx -f/u obgyn and pcp as outpatient   Diet: Carb modified DVT PPx: Lovenox Dispo: observation   Signed: Jerene Pitch 09/25/2012, 11:55 PM

## 2012-09-25 NOTE — ED Provider Notes (Signed)
History     CSN: [id number]  Arrival date & time 09/25/12  1705   First MD Initiated Contact with Patient 09/25/12 2059      Chief Complaint  Patient presents with  . Weakness  . Chest Pain    (Consider location/radiation/quality/duration/timing/severity/associated sxs/prior treatment) HPI Pt presents with c/o generalized weakness, cough and nasal congestion.  She states she feels a cold is coming on.  She has been feeling this way for a couple of days and symptoms are worsening.  No sob.  No abdominal pain or vomiting.  States she has not been taking her insulin for at least one month due to running out and not having money to purchase.  States she has been urinating more frequently.  Denies burning of urination.  There are no other associated systemic symptoms, there are no other alleviating or modifying factors.   Past Medical History  Diagnosis Date  . Asthma   . Diabetes mellitus   . Hyperlipidemia   . Tobacco abuse     Past Surgical History  Procedure Date  . Tubal ligation   . Cesarean section     Family History  Problem Relation Age of Onset  . Stroke Mother   . Diabetes Father   . Diabetes Mother   . Diabetes Maternal Grandmother   . Diabetes Maternal Grandfather   . Hypertension Mother   . Hypertension Father   . Cancer Paternal Grandfather     Prostate  . Aneurysm Mother     History  Substance Use Topics  . Smoking status: Former Smoker -- 0.3 packs/day for 1 years    Types: Cigarettes    Quit date: 09/02/2010  . Smokeless tobacco: Not on file  . Alcohol Use: Yes     Comment: occassionally    OB History    Grav Para Term Preterm Abortions TAB SAB Ect Mult Living                  Review of Systems ROS reviewed and all otherwise negative except for mentioned in HPI  Allergies  Review of patient's allergies indicates no known allergies.  Home Medications   No current outpatient prescriptions on file.  BP 120/80  Pulse 86  Temp 97.7  F (36.5 C) (Oral)  Resp 18  Wt 217 lb 8 oz (98.657 kg)  SpO2 98%  LMP 09/18/2012 Vitals reviewed Physical Exam Physical Examination: General appearance - alert, well appearing, and in no distress Mental status - alert, oriented to person, place, and time Eyes - no conjunctival injection, no scleral icterus Mouth - mucous membranes moist, pharynx normal without lesions Chest - clear to auscultation, no wheezes, rales or rhonchi, symmetric air entry Heart - normal rate, regular rhythm, normal S1, S2, no murmurs, rubs, clicks or gallops Abdomen - soft, nontender, nondistended, no masses or organomegaly, nabs Extremities - peripheral pulses normal, no pedal edema, no clubbing or cyanosis Skin - normal coloration and turgor, no rashes  ED Course  Procedures (including critical care time)  10:13 PM  D/w [facility] resident.  They will review her chart and call me back.  Pt is getting antibiotics, IV insulin and fluids.    10:17 PM d/w [facility] resident.  They will admit for observation overnight.    Labs Reviewed  URINALYSIS, ROUTINE W REFLEX MICROSCOPIC - Abnormal; Notable for the following:    Specific Gravity, Urine 1.040 (*)     Glucose, UA >1000 (*)     Hgb urine dipstick TRACE (*)  Nitrite POSITIVE (*)     All other components within normal limits  CBC WITH DIFFERENTIAL - Abnormal; Notable for the following:    MCH 25.9 (*)     All other components within normal limits  BASIC METABOLIC PANEL - Abnormal; Notable for the following:    Sodium 134 (*)     Glucose, Bld 517 (*)     GFR calc non Af Amer 79 (*)     All other components within normal limits  URINE MICROSCOPIC-ADD ON - Abnormal; Notable for the following:    Squamous Epithelial / LPF FEW (*)     Bacteria, UA MANY (*)     All other components within normal limits  GLUCOSE, CAPILLARY - Abnormal; Notable for the following:    Glucose-Capillary 354 (*)     All other components within normal limits  GLUCOSE, CAPILLARY -  Abnormal; Notable for the following:    Glucose-Capillary 272 (*)     All other components within normal limits  HEMOGLOBIN A1C - Abnormal; Notable for the following:    Hemoglobin A1C 17.0 (*)     Mean Plasma Glucose 441 (*)     All other components within normal limits  BASIC METABOLIC PANEL - Abnormal; Notable for the following:    Glucose, Bld 394 (*)     Calcium 8.2 (*)     All other components within normal limits  LIPID PANEL - Abnormal; Notable for the following:    Cholesterol 206 (*)     Triglycerides 198 (*)     LDL Cholesterol 124 (*)     All other components within normal limits  GLUCOSE, CAPILLARY - Abnormal; Notable for the following:    Glucose-Capillary 188 (*)     All other components within normal limits  MRSA PCR SCREENING - Abnormal; Notable for the following:    MRSA by PCR POSITIVE (*)     All other components within normal limits  GLUCOSE, CAPILLARY - Abnormal; Notable for the following:    Glucose-Capillary 307 (*)     All other components within normal limits  GLUCOSE, CAPILLARY - Abnormal; Notable for the following:    Glucose-Capillary 255 (*)     All other components within normal limits  GLUCOSE, CAPILLARY - Abnormal; Notable for the following:    Glucose-Capillary 204 (*)     All other components within normal limits  POCT PREGNANCY, URINE  POCT I-STAT TROPONIN I  TSH  HIV ANTIBODY (ROUTINE TESTING)  TROPONIN I  TROPONIN I  URINE CULTURE  MICROALBUMIN / CREATININE URINE RATIO  TROPONIN I   Dg Chest 2 View  09/25/2012  *RADIOLOGY REPORT*  Clinical Data: Short of breath and chest pain  CHEST - 2 VIEW  Comparison: 08/12/2011  Findings: The heart size and mediastinal contours are within normal limits.  Both lungs are clear.  The visualized skeletal structures are unremarkable.  IMPRESSION: Negative examination.   Original Report Authenticated By: Kerby Moors, M.D.      1. Diabetes mellitus type 2, uncontrolled   2. Hyperglycemia without  ketosis       MDM  Pt presnenting with c/o mild cough, nasal congestion.  Found to be hyperglycemic in 500s- pt states she has not been taking her insulin due to financial constraints.  She is not in DKA.  Pt also has evidence of UTI.  She was started on IV Hydration, abx, insulin.  D/w OPC and patient will be admitted overnight for observation.  Threasa Beards, MD 09/26/12 1745

## 2012-09-25 NOTE — ED Notes (Signed)
Pt is here with weakness, left sided chest pain, sob  For couple of days.  Denies cough

## 2012-09-26 DIAGNOSIS — E1142 Type 2 diabetes mellitus with diabetic polyneuropathy: Secondary | ICD-10-CM

## 2012-09-26 DIAGNOSIS — E1149 Type 2 diabetes mellitus with other diabetic neurological complication: Principal | ICD-10-CM

## 2012-09-26 DIAGNOSIS — R079 Chest pain, unspecified: Secondary | ICD-10-CM

## 2012-09-26 DIAGNOSIS — N898 Other specified noninflammatory disorders of vagina: Secondary | ICD-10-CM

## 2012-09-26 LAB — GLUCOSE, CAPILLARY
Glucose-Capillary: 188 mg/dL — ABNORMAL HIGH (ref 70–99)
Glucose-Capillary: 307 mg/dL — ABNORMAL HIGH (ref 70–99)
Glucose-Capillary: 255 mg/dL — ABNORMAL HIGH (ref 70–99)
Glucose-Capillary: 204 mg/dL — ABNORMAL HIGH (ref 70–99)

## 2012-09-26 LAB — HEMOGLOBIN A1C: Mean Plasma Glucose: 441 mg/dL — ABNORMAL HIGH (ref <117)

## 2012-09-26 LAB — LIPID PANEL
Cholesterol: 206 mg/dL — ABNORMAL HIGH (ref 0–200)
Total CHOL/HDL Ratio: 4.9 RATIO
Triglycerides: 198 mg/dL — ABNORMAL HIGH (ref <150)
VLDL: 40 mg/dL (ref 0–40)

## 2012-09-26 LAB — BASIC METABOLIC PANEL
CO2: 24 mEq/L (ref 19–32)
Chloride: 107 mEq/L (ref 96–112)
GFR calc Af Amer: >90 mL/min (ref >90)
Potassium: 4.2 mEq/L (ref 3.5–5.1)
Sodium: 141 mEq/L (ref 135–145)

## 2012-09-26 LAB — HIV ANTIBODY (ROUTINE TESTING W REFLEX): HIV: NONREACTIVE

## 2012-09-26 MED ORDER — INSULIN ASPART PROT & ASPART (70-30 MIX) 100 UNIT/ML ~~LOC~~ SUSP
20.0000 [IU] | Freq: Two times a day (BID) | SUBCUTANEOUS | Status: DC
  Administered 2012-09-26 (×2): 20 [IU] via SUBCUTANEOUS
  Filled 2012-09-26: qty 3

## 2012-09-26 MED ORDER — SIMVASTATIN 40 MG PO TABS
40.0000 mg | ORAL_TABLET | Freq: Every day | ORAL | Status: DC
  Administered 2012-09-26: 40 mg via ORAL
  Filled 2012-09-26 (×2): qty 1

## 2012-09-26 MED ORDER — METRONIDAZOLE 500 MG PO TABS
500.0000 mg | ORAL_TABLET | Freq: Two times a day (BID) | ORAL | Status: DC
  Administered 2012-09-26 (×2): 500 mg via ORAL
  Filled 2012-09-26 (×4): qty 1

## 2012-09-26 MED ORDER — NYSTATIN 100000 UNIT/GM EX POWD
Freq: Three times a day (TID) | CUTANEOUS | Status: DC
  Administered 2012-09-26 (×3): via TOPICAL
  Filled 2012-09-26 (×2): qty 15

## 2012-09-26 MED ORDER — SODIUM CHLORIDE 0.9 % IV SOLN
INTRAVENOUS | Status: DC
  Administered 2012-09-26 – 2012-09-27 (×3): via INTRAVENOUS

## 2012-09-26 MED ORDER — INSULIN ASPART 100 UNIT/ML ~~LOC~~ SOLN
0.0000 [IU] | Freq: Three times a day (TID) | SUBCUTANEOUS | Status: DC
  Administered 2012-09-26: 7 [IU] via SUBCUTANEOUS
  Administered 2012-09-26: 5 [IU] via SUBCUTANEOUS
  Administered 2012-09-26: 3 [IU] via SUBCUTANEOUS
  Administered 2012-09-27 (×2): 5 [IU] via SUBCUTANEOUS

## 2012-09-26 MED ORDER — ENOXAPARIN SODIUM 40 MG/0.4ML ~~LOC~~ SOLN
40.0000 mg | SUBCUTANEOUS | Status: DC
  Administered 2012-09-26 – 2012-09-27 (×2): 40 mg via SUBCUTANEOUS
  Filled 2012-09-26 (×2): qty 0.4

## 2012-09-26 MED ORDER — FLUCONAZOLE 150 MG PO TABS
150.0000 mg | ORAL_TABLET | Freq: Every day | ORAL | Status: DC
  Administered 2012-09-26 (×2): 150 mg via ORAL
  Filled 2012-09-26 (×3): qty 1

## 2012-09-26 MED ORDER — KETOROLAC TROMETHAMINE 10 MG PO TABS
10.0000 mg | ORAL_TABLET | Freq: Three times a day (TID) | ORAL | Status: DC
  Administered 2012-09-26 – 2012-09-27 (×5): 10 mg via ORAL
  Filled 2012-09-26 (×8): qty 1

## 2012-09-26 NOTE — Progress Notes (Signed)
Admitted with cough, congestion, and hyperglycemia.  A1c currently in process.  Spoke to patient about her diabetes regimen at home.  Patient told me she has had diabetes since 1996.  Developed GDM with her 1st pregnancy.  Patient also told me both her mother and her father have diabetes.  Patient stated she is knowledgeable and knows what to do, she just has financial difficulties affording her insulin.  Does buy Novolin Reli-on brand 70/30 insulin at [facility] for $24.88 per vial.  Tries to take the 70/30 insulin consistently, however, if she runs out and does not have money she does not take the insulin.  Has a [employer] CBG meter but needs to buy more strips for the meter.  Patient admits to not checking her sugars b/c of finances as well.  Encouraged patient to check at least bid at home and keep a record for the MD in the Int. Medicine clinic.  Has seen Barry Brunner, CDE in the clinic before.  Has 2 children at home and 1 grandchild and patient also told me she applied for Medicaid back in October.  Told patient that the MD ordered a HgbA1c level for her.  Patient stated she knows that is a measurement of her average CBGs for 3 months.  Stated she knows that her last A1c back in March was >12%.  Patient also told me she knows that her A1c should be closer to 7%.  Patient told me she wants to take better care of her diabetes, she just lacks the financial resources to do this.  Noted 70/30 insulin (20 units bid) started today.  CBGs 307/255 mg/dl.  Will likely need 70/30 insulin titrated.  Will follow closely.   Wyn Quaker RN, MSN, CDE Diabetes Coordinator Inpatient Diabetes Program [phone]

## 2012-09-26 NOTE — Progress Notes (Signed)
Nutrition Brief Note  Patient identified on the Malnutrition Screening Tool (MST) Report. Per discussion with patient, current weight is stable. She reports that she is currently eating well.  Pt had a few questions regarding diet and diabetes. Discussed different food groups and their effects on blood sugar, emphasizing carbohydrate-containing foods. Provided list of carbohydrates and recommended serving sizes of common foods.  Discussed importance of controlled and consistent carbohydrate intake throughout the day. Provided examples of ways to balance meals/snacks and encouraged intake of high-fiber, whole grain complex carbohydrates.  BMI is 36.2. Pt meets criteria for Obese Class II based on current BMI.   Current diet order is Carbohydrate Modified Medium, patient is consuming approximately 100% of meals at this time. Labs and medications reviewed.   No nutrition interventions warranted at this time. If nutrition issues arise, please consult RD.   Jane Coke MS, RD, LDN Pager: [phone] After-hours pager: [phone]

## 2012-09-26 NOTE — H&P (Signed)
Internal Medicine Attending Admission Note Date: 09/26/2012  Patient name: Jane [name] Medical record number: [id number] Date of birth: Jun 02, 1976 Age: 36 y.o. Gender: female  I saw and evaluated the patient. I reviewed the resident's note and I agree with the resident's findings and plan as documented in the resident's note, with the following additional comments.  Chief Complaint(s): Nasal congestion, midsternal chest aching aggravated by deep inspiration  History - key components related to admission: Patient is a 36 year old woman with history of uncontrolled type 2 diabetes mellitus, asthma, hyperlipidemia, and other problems as outlined in the medical history, admitted with complaint of nasal congestion, cold symptoms, and midsternal chest aching which began last week with symptoms of an upper respiratory infection.  She reports that she has not been taking her insulin for months because she could not afford it.  Physical Exam - key components related to admission:  Filed Vitals:   09/26/12 0106 09/26/12 0506 09/26/12 0844 09/26/12 1323  BP: 127/74 111/54 110/68 120/80  Pulse: 99 88 76 86  Temp: 98.1 F (36.7 C) 98.3 F (36.8 C) 98.6 F (37 C) 97.7 F (36.5 C)  TempSrc: Oral Oral Oral Oral  Resp: 20 18 18 18   Weight: 217 lb 8 oz (98.657 kg)     SpO2: 100% 98% 98% 98%   General: Alert, no distress Lungs: Clear Heart: Regular; no extra sounds or murmurs Chest: Patient is tender in the lower midsternal area, and her pain is reproduced by palpation of that area; there are no palpable masses or other abnormalities. Abdomen: Bowel sounds present, soft, nontender Extremities: No edema  Lab results:   Basic Metabolic Panel:  Basename 09/26/12 0540 09/25/12 1721  NA 141 134*  K 4.2 4.4  CL 107 97  CO2 24 26  GLUCOSE 394* 517*  BUN 13 13  CREATININE 0.82 0.92  CALCIUM 8.2* 9.2  MG -- --  PHOS -- --     CBC:  Basename 09/25/12 1721  WBC 7.5  NEUTROABS 4.1  HGB  13.1  HCT 39.9  MCV 79.0  PLT 257    CBG:  Basename 09/26/12 1159 09/26/12 0759 09/26/12 0101 09/25/12 2341 09/25/12 2210  GLUCAP 255* 307* 188* 272* 354*    Fasting Lipid Panel:  Basename 09/26/12 0540  CHOL 206*  HDL 42  LDLCALC 124*  TRIG 198*  CHOLHDL 4.9  LDLDIRECT --   Thyroid Function Tests:  Basename 09/26/12 0540  TSH 0.523  T4TOTAL --  FREET4 --  T3FREE --  THYROIDAB --   Cardiac Panel (last 3 results)  Basename 09/26/12 1521 09/26/12 0852  CKTOTAL -- --  CKMB -- --  TROPONINI <0.30 <0.30  RELINDX -- --    Imaging results:  Dg Chest 2 View  09/25/2012  *RADIOLOGY REPORT*  Clinical Data: Short of breath and chest pain  CHEST - 2 VIEW  Comparison: 08/12/2011  Findings: The heart size and mediastinal contours are within normal limits.  Both lungs are clear.  The visualized skeletal structures are unremarkable.  IMPRESSION: Negative examination.   Original Report Authenticated By: Kerby Moors, M.D.     Other results: EKG: Normal sinus rhythm; septal infarct, age undetermined; no significant change   Assessment & Plan by Problem:  1.  Hyperglycemia.  Patient presented with marked hyperglycemia due to not taking her insulin.  Her blood sugars are improved following initial insulin treatment.  Plan is follow blood sugars and adjust insulin regimen; IV volume replacement; diabetes education.  2.  Midsternal  chest pain.  There is no evidence of acute coronary syndrome by EKG or cardiac enzymes, and patient has definite midsternal tenderness to palpation which reproduces her pain.  This is likely musculoskeletal pain, and the plan is symptomatic treatment.  3.  Difficulty affording medications.  Plan is social work and case management consult.  4.  Other problems as per resident physician's note.

## 2012-09-26 NOTE — Progress Notes (Signed)
Met with patient briefly. She reports caring for others and not herself as primary barrier to diabetes control. Her Medicaid is pending.She is familiar with how orange card works and is thinking about applying to help her afford medicines and supplies until [facility] decision made. Financial counselor information, social work and CDE cards given to patient. She was encouraged to have appointment with financial counselor upon discharge from hospital.

## 2012-09-27 DIAGNOSIS — E1310 Other specified diabetes mellitus with ketoacidosis without coma: Secondary | ICD-10-CM

## 2012-09-27 DIAGNOSIS — E669 Obesity, unspecified: Secondary | ICD-10-CM

## 2012-09-27 LAB — URINE CULTURE: Colony Count: >=100000

## 2012-09-27 LAB — GLUCOSE, CAPILLARY: Glucose-Capillary: 259 mg/dL — ABNORMAL HIGH (ref 70–99)

## 2012-09-27 MED ORDER — SIMVASTATIN 40 MG PO TABS: 40.0000 mg | ORAL_TABLET | Freq: Every day | ORAL | Status: DC

## 2012-09-27 MED ORDER — INSULIN ASPART PROT & ASPART (70-30 MIX) 100 UNIT/ML ~~LOC~~ SUSP: 30.0000 [IU] | Freq: Two times a day (BID) | SUBCUTANEOUS | Status: DC

## 2012-09-27 MED ORDER — CHLORHEXIDINE GLUCONATE CLOTH 2 % EX PADS: 6.0000 | MEDICATED_PAD | Freq: Every day | CUTANEOUS | Status: DC

## 2012-09-27 MED ORDER — MUPIROCIN 2 % EX OINT
1.0000 "application " | TOPICAL_OINTMENT | Freq: Two times a day (BID) | CUTANEOUS | Status: DC
  Filled 2012-09-27: qty 22

## 2012-09-27 NOTE — Care Management Note (Signed)
    Page 1 of 1   09/27/2012     3:33:21 PM   CARE MANAGEMENT NOTE 09/27/2012  Patient:  Jane [name], Jane [name]   Account Number:  [id number]  Date Initiated:  09/27/2012  Documentation initiated by:  GRAVES-BIGELOW,Fong Mccarry  Subjective/Objective Assessment:   Pt admitted with nasal congestion. was d/c before CM was able to see today. Pt will f/u in the clinic  and will be helped with medicaiton assistnace via [place].     Action/Plan:   Anticipated DC Date:  09/27/2012   Anticipated DC Plan:  HOME/SELF CARE         Choice offered to / List presented to:             Status of service:  Completed, signed off Medicare Important Message given?   (If response is "NO", the following Medicare IM given date fields will be blank) Date Medicare IM given:   Date Additional Medicare IM given:    Discharge Disposition:  HOME/SELF CARE  Per UR Regulation:  Reviewed for med. necessity/level of care/duration of stay  If discussed at [place] of Stay Meetings, dates discussed:    Comments:

## 2012-09-27 NOTE — Progress Notes (Signed)
Internal Medicine Attending  Date: 09/27/2012  Patient name: Jane [name] Medical record number: [id number] Date of birth: 1976/08/01 Age: 36 y.o. Gender: female  I saw and evaluated the patient on a.m. rounds with house staff. I reviewed the resident's note by Dr. Eulas Post and I agree with the resident's findings and plans as documented in her note.

## 2012-09-27 NOTE — Progress Notes (Signed)
Subjective: Pt feeling better. MSK pain in chest improved. Pt ready for d/c home.  Objective: Vital signs in last 24 hours: Filed Vitals:   09/26/12 1323 09/26/12 1751 09/26/12 2041 09/27/12 0523  BP: 120/80 122/76 125/67 144/88  Pulse: 86 89 88 96  Temp: 97.7 F (36.5 C) 97.7 F (36.5 C) 98.1 F (36.7 C) 98.6 F (37 C)  TempSrc: Oral Oral Oral Oral  Resp: 18 18 18 18   Weight:   229 lb 11.2 oz (104.191 kg)   SpO2: 98% 96% 100% 96%   Weight change: 12 lb 3.2 oz (5.534 kg)  Intake/Output Summary (Last 24 hours) at 09/27/12 0703 Last data filed at 09/27/12 [id number]  Gross per 24 hour  Intake 2373.33 ml  Output    876 ml  Net 1497.33 ml   Vitals reviewed. General: resting in bed, NAD HEENT: PERRL, EOMI, no scleral icterus Cardiac: RRR, no rubs, murmurs or gallops Pulm: clear to auscultation bilaterally, no wheezes, rales, or rhonchi Abd: soft, nontender, nondistended, BS present Ext: warm and well perfused, no pedal edema Neuro: alert and oriented X3, cranial nerves II-XII grossly intact, strength and sensation to light touch equal in bilateral upper and lower extremities  Lab Results: Basic Metabolic Panel:  Lab [id number] 0540 09/25/12 1721  NA 141 134*  K 4.2 4.4  CL 107 97  CO2 24 26  GLUCOSE 394* 517*  BUN 13 13  CREATININE 0.82 0.92  CALCIUM 8.2* 9.2  MG -- --  PHOS -- --   CBC:  Lab 09/25/12 1721  WBC 7.5  NEUTROABS 4.1  HGB 13.1  HCT 39.9  MCV 79.0  PLT 257   Cardiac Enzymes:  Lab 09/26/12 2246 09/26/12 1521 09/26/12 0852  CKTOTAL -- -- --  CKMB -- -- --  CKMBINDEX -- -- --  TROPONINI <0.30 <0.30 <0.30   CBG:  Lab 09/26/12 2041 09/26/12 1703 09/26/12 1159 09/26/12 0759 09/26/12 0101 09/25/12 2341  GLUCAP 167* 204* 255* 307* 188* 272*   Hemoglobin A1C:  Lab 09/26/12 0540  HGBA1C 17.0*   Fasting Lipid Panel:  Lab 09/26/12 0540  CHOL 206*  HDL 42  LDLCALC 124*  TRIG 198*  CHOLHDL 4.9  LDLDIRECT --   Thyroid Function Tests:  Lab  09/26/12 0540  TSH 0.523  T4TOTAL --  FREET4 --  T3FREE --  THYROIDAB --   Urinalysis:  Lab 09/25/12 1727  COLORURINE YELLOW  LABSPEC 1.040*  PHURINE 5.5  GLUCOSEU >1000*  HGBUR TRACE*  BILIRUBINUR NEGATIVE  KETONESUR NEGATIVE  PROTEINUR NEGATIVE  UROBILINOGEN 0.2  NITRITE POSITIVE*  LEUKOCYTESUR NEGATIVE     Micro Results: Recent Results (from the past 240 hour(s))  URINE CULTURE     Status: Normal (Preliminary result)   Collection Time   09/25/12  5:27 PM      Component Value Range Status Comment   Specimen Description URINE, CLEAN CATCH   Final    Special Requests NONE   Final    Culture  Setup Time 09/25/2012 20:06   Final    Colony Count >=100,000 COLONIES/ML   Final    Culture ESCHERICHIA COLI   Final    Report Status PENDING   Incomplete   MRSA PCR SCREENING     Status: Abnormal   Collection Time   09/26/12  6:52 AM      Component Value Range Status Comment   MRSA by PCR POSITIVE (*) NEGATIVE Final    Studies/Results: Dg Chest 2 View  09/25/2012  *RADIOLOGY REPORT*  Clinical Data: Short of breath and chest pain  CHEST - 2 VIEW  Comparison: 08/12/2011  Findings: The heart size and mediastinal contours are within normal limits.  Both lungs are clear.  The visualized skeletal structures are unremarkable.  IMPRESSION: Negative examination.   Original Report Authenticated By: Kerby Moors, M.D.    Medications: I have reviewed the patient's current medications. Scheduled Meds:   . enoxaparin (LOVENOX) injection  40 mg Subcutaneous Q24H  . fluconazole  150 mg Oral q1800  . insulin aspart  0-9 Units Subcutaneous TID WC  . insulin aspart protamine-insulin aspart  20 Units Subcutaneous BID WC  . ketorolac  10 mg Oral Q8H  . nystatin   Topical TID  . simvastatin  40 mg Oral q1800  . [DISCONTINUED] metroNIDAZOLE  500 mg Oral Q12H   Continuous Infusions:   . sodium chloride 100 mL/hr at 09/26/12 1121   PRN Meds:. Assessment/Plan: Ms. Kandice Robinsons is a 36 year old  obese African American female with PMH of uncontrolled DM II, Asthma, and Hyperlipidema admitted for hyperglycemia.   1. Hyperglycemia without ketosis in the setting of DM II: Likely secondary to non-adherence with insulin at home. She is supposed to be on Novolog 70/30 40 units in AM and 30 units in PM, but has not been able to afford it. Hb A1c 12.16 October 2011. Glucose on admission 517. U/A on admission: glucose >1000, positive nitrite, urobilinogen 0.2, trace Hb with 0-2 rbc and 3-6 wbc with many bacteria, but pt is asymptomatic. No anion gap. A1c 17. On admission, she was started on IVF and Novolog 70/30 20u BID. Since admission, her CBGs have been elevated but better controlled. [name] Plyler, the [facility] Diabetes Coordinator stopped by and saw the pt. She provided information to the pt about applying for an [employer], and told the pt to follow up with her in clinic. A lipid panel was obtained, which showed a tot cholesterol of 206 and LDL 124. She was started on simvastatin.   2. 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 HbA1c 10/2011: 12.2. Morbid obesity weight 228 lbs on admission. Lipid panel from 10/2011: LDL 145, Chol 221, HDL 49, TG 137.  -same plan as above in #1   3. Chest pain: Likely musculoskeletal given reproducibility, tenderness to palpation, and exacerbated with position change. ACS unlikely, as troponin x3 negative and no acute ischemic changes noted on initial EKG. CXR on admission without acute changes including PNA or pneumothorax.  -Toradol PRN pain control   4. Vaginal discharge: reports having "yeast infection" with brownish-white clumps on vaginal skin folds and inside. Hx of multiple episodes when "sugars not controlled". Denies foul smelling discharge. LMP 1 week prior. U/A on admission: glucose >1000, positive nitrite,  urobilinogen 0.2, trace Hb with 0-2 rbc and 3-6 wbc with many bacteria, and urine cx + for E coli, but pt asymptomatic.  KOH wet prep and G/C probe sent but were unfortunately canceled by the lab. Given Rocephin 1g IV x1 in ED. HIV negative.  -nystatin powder  -diflucan 150mg  q72 hours for 3 doses  -f/u obgyn and pcp as outpatient   5. Hyperlipidemia: Lipid panel is elevated on admission, with LDL 124. She was started on simvastatin this admission. - Continue simvastatin 40mg  daily  DVT PPx: Lovenox  5. Dispo: D/c home today  The patient I have reviewed the patient's current medications.  have current PCP Sissy Hoff, Eliezer Lofts, MD), therefore is require OPC follow-up after discharge.   The patient does not have transportation limitations that hinder transportation to clinic appointments.  .Services Needed at time of discharge: Y = Yes, Blank = No PT:   OT:   RN:   Equipment:   Other:     LOS: 2 days   Otho Bellows 09/27/2012, 7:03 AM

## 2012-09-27 NOTE — Discharge Summary (Signed)
[facility] Discharge Note  Name: Jane [name] MRN: [id number] DOB: May 27, 1976 36 y.o.  Date of Admission: 09/25/2012  8:27 PM Date of Discharge: 09/28/2012 Attending Physician: No att. providers found  Discharge Diagnosis: Principal Problem:  *Hyperglycemia without ketosis Active Problems:  Diabetes mellitus type 2, uncontrolled  Obesity   Discharge Medications:   Medication List     As of 09/28/2012  5:41 PM    TAKE these medications         albuterol 108 (90 BASE) MCG/ACT inhaler   Commonly known as: PROVENTIL HFA;VENTOLIN HFA   Inhale 2 puffs into the lungs every 6 (six) hours as needed. wheezing      insulin aspart protamine-insulin aspart (70-30) 100 UNIT/ML injection   Commonly known as: NOVOLOG 70/30   Inject 30 Units into the skin 2 (two) times daily with a meal.      simvastatin 40 MG tablet   Commonly known as: ZOCOR   Take 1 tablet (40 mg total) by mouth daily at 6 PM.         Disposition and follow-up:   Jane [name] was discharged from [facility] in [place].  At the hospital follow up visit please address her insulin compliance and her blood sugar control. She was not treated for a UTI because she was asymptomatic, but please address if she has any complaints.  Follow-up Appointments:  Discharge Orders    Future Appointments: Provider: Department: Dept Phone: Center:   10/16/2012 10:15 AM Neta Ehlers, MD [place] [phone] [facility]     Future Orders Please Complete By Expires   Diet Carb Modified      Activity as tolerated - No restrictions      Call MD for:  persistant nausea and vomiting      Call MD for:  severe uncontrolled pain      Call MD for:  persistant dizziness or light-headedness         Consultations:    Procedures Performed:  Dg Chest 2 View  09/25/2012  *RADIOLOGY REPORT*  Clinical Data: Short of breath and chest pain  CHEST - 2 VIEW   Comparison: 08/12/2011  Findings: The heart size and mediastinal contours are within normal limits.  Both lungs are clear.  The visualized skeletal structures are unremarkable.  IMPRESSION: Negative examination.   Original Report Authenticated By: Kerby Moors, M.D.     Admission History of Present Illness: Jane [name] is a 36 year old African American female with PMH of uncontrolled DM II (Hb A1c 12.16 October 2011), Asthma, and Hyperlipidema presented to the ED today from home with complaints of worsening nasal congestion, fatigue, and cough associated chest pain x1 week. Jane [name] claims started last Thursday 09/20/12 she noticed mild nasal congestion which was followed by 3 episodes of vomiting and diarrhea that spontaneously resolved that day. Over the course of the week, her nasal congestion got worse and she developed a mild productive cough with white-yellow phlegm and substernal chest pain with coughing and movement. She tried taking mucinex and anti congestion medicine at home to no relief. Currently she does not have a cough, however, her chest pain is constant, 7/10, "achy" in nature, non radiating, tender to palpation, and exacerbated with movement. She also admits to not taking her insulin--citing affordability as the main reason for non-adherence. She is supposed to be on Novolog 70/30 with 40 units in AM and 30 units in PM. Of note,  she has not been seen in [facility] since last year. Jane [name] also endorses occasional numbness in her b/l feet, ankle swelling at times after standing or being at work all day, and multiple "yeast" infections with a description of pale-brownish clumps on skin of vagina and inside when her sugar "acts up". She is also currently complaining of headache/head pressure on top of her head that started with the nasal congestion. She denies any vision disturbances, sore throat, fever, abdominal pain, palpitations, shortness of breath, dysuria, diarrhea, nausea, vomiting, or  constipation at this time.  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 In the ED she was given rocephin 1g IV x1, NS bolus 1L, and negative pregnancy test.   Review of Systems:  Constitutional:  Chills and fatigue. Denies fever, diaphoresis, appetite change.   HEENT:  Nasal congestion, headache. Denies sore throat, rhinorrhea, sneezing, mouth sores, trouble swallowing, neck pain   Respiratory:  Occasional productive cough. Denies SOB, DOE, and wheezing.   Cardiovascular:  Chest pain with cough and palpation. Denies palpitations and leg swelling.   Gastrointestinal:  Vomiting x3 episodes and diarrhea last week. Denies nausea, abdominal pain, constipation, blood in stool and abdominal distention.   Genitourinary:  Frequency. Vaginal yeast. Denies dysuria, urgency, frequency, hematuria, flank pain and difficulty urinating.   Musculoskeletal:  B/l ankle swelling occasionally, b/l foot neuropathy. Denies myalgias, back pain, arthralgias and gait problem.   Skin:  Pink peeled skin around vagina. Dry. Denies pallor and wound.   Neurological:  Headaches. Denies dizziness, seizures, syncope, weakness, light-headedness, numbness.   Physical Exam:  Blood pressure 104/55, pulse 92, temperature 98.1 F (36.7 C), temperature source Oral, resp. rate 19, last menstrual period 09/18/2012, SpO2 100.00%.  Vitals reviewed.  General: resting in bed, NAD  HEENT: PERRLA, EOMI, left conjunctival red injection  Cardiac: Tachycardia, no rubs, murmurs or gallops  Pulm: clear to auscultation bilaterally, no wheezes, rales, or rhonchi  Abd: soft, nontender, obese, nondistended, BS present, stretch marks, darkened discoloration to the left of abdominal midline lower quadrant.  Ext: warm and well perfused, dry skin, no pedal edema, non tender to palpation, +2 dp, left great  toe nail half broke, darkened discoloration of right great toenail  Neuro: alert and oriented X3, cranial nerves II-XII grossly intact, strength and sensation to light touch equal in bilateral upper and lower extremities  GYN: pink peeled skin between thighs surrounding vagina, non tender to palpation, no discharge noted. Patient placed powder in area.   Hospital Course by problem list: Jane [name] is a 35 year old obese African American female with PMH of uncontrolled DM II, Asthma, and Hyperlipidema admitted for hyperglycemia.   1. Hyperglycemia without ketosis in the setting of DM II: Likely secondary to non-adherence with insulin at home due to financial constraints. She is supposed to be on Novolog 70/30 40 units in AM and 30 units in PM, but has not been able to afford it. Hb A1c 12.16 October 2011. Glucose on admission 517. No anion gap. A1c 17. On admission, she was started on IVF and Novolog 70/30 20u BID. Since admission, her CBGs have been elevated but better controlled. Due to her elevated blood sugars, her insulin was increased to 30u BID. [name] Plyler, the [facility] Diabetes Coordinator stopped by and saw the pt. She provided information to the pt about applying  for an [employer], and told the pt to follow up with her in clinic. With the [facility], she should be better able to afford her insulin.  2. 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 HbA1c 10/2011: 12.2. Morbid obesity weight 228 lbs on admission. Lipid panel from 10/2011: LDL 145, Chol 221, HDL 49, TG 137.  -same plan as above in #1   3. Chest pain: Likely musculoskeletal given reproducibility, tenderness to palpation, and exacerbated with position change. ACS unlikely, as troponin x3 negative and no acute ischemic changes noted on initial EKG. CXR on admission without acute changes  including PNA or pneumothorax. Toradol was administered PRN for pain control in the hospital. Her pain had improved durign her hospitalization, and she was told that she could take Ibuprofen or Tylenol at home for pain control.   4. Vaginal discharge: On admission, she reported having a "yeast infection" with brownish-white clumps on vaginal skin folds and inside. She has a history of multiple yeast infections that occur when her "sugars not controlled". She denied any foul smelling discharge. Her last menstrual period was 1 week prior to admission. U/A on admission: glucose >1000, positive nitrite, urobilinogen 0.2, trace Hb with 0-2 rbc and 3-6 wbc with many bacteria, and urine cx + for E coli, but pt asymptomatic. KOH wet prep and G/C probe sent but were unfortunately canceled by the lab. She was given Rocephin 1g IV x1 in ED. HIV negative. Nystatin powder and diflucan 150mg  q72 hours for 3 doses were started on admission.She is to follow up with OBGYN and her PCP as an outpatient.  5. Hyperlipidemia: A lipid panel was obtained during this admission. Her LDL was elevated to 124. She was started on simvastatin, which was continued at discharge. This will need to be followed by her PCP. - Continue simvastatin 40mg  daily   6. Urinary tract infection: U/A on admission: glucose >1000, positive nitrite, urobilinogen 0.2, trace Hb with 0-2 rbc and 3-6 wbc with many bacteria. The pt was asymptomatic on admission and remains asymptomatic throughout her hospitalization, so we did not treat. If she does become symptomatic she will need to be treated.    Discharge Vitals:  BP 137/90  Pulse 89  Temp 98.3 F (36.8 C) (Oral)  Resp 18  Wt 229 lb 11.2 oz (104.191 kg)  SpO2 98%  LMP 09/18/2012  Discharge Labs:  No results found for this or any previous visit (from the past 24 hour(s)).  Signed: Otho Bellows 09/28/2012, 5:41 PM   Time Spent on Discharge: 35 min Services Ordered on Discharge:  Medication assistance Equipment Ordered on Discharge: None

## 2012-10-01 ENCOUNTER — Telehealth: Payer: Self-pay | Admitting: Dietician

## 2012-10-02 NOTE — Telephone Encounter (Signed)
Discharge date:09/28/12 Call date: 10/02/12 Hospital follow up appointment date: 12/313  Calling to assist with transition of care from hospital to home.  Discharge medications reviewed:got her meds and strips has been checking before giving injections  Able to fill all prescriptions? yes  Patient aware of hospital follow up appointments. Yes- reviewed and she is okay waiting until 10/16/12  No problems with transportation.- denied- she has a car.   Other problems/concerns: wanted to know if she can drink some regular pepsi. Discussed working it in her meal plan in moderation just like any other sugary/sweet food.

## 2012-10-12 ENCOUNTER — Emergency Department (HOSPITAL_COMMUNITY): Payer: Medicaid Other

## 2012-10-12 ENCOUNTER — Emergency Department (HOSPITAL_COMMUNITY)
Admission: EM | Admit: 2012-10-12 | Discharge: 2012-10-12 | Disposition: A | Discharge status: Home / Self Care | Payer: Medicaid Other | Attending: Emergency Medicine | Admitting: Emergency Medicine

## 2012-10-12 ENCOUNTER — Encounter (HOSPITAL_COMMUNITY): Payer: Self-pay | Admitting: *Deleted

## 2012-10-12 DIAGNOSIS — R5383 Other fatigue: Secondary | ICD-10-CM | POA: Insufficient documentation

## 2012-10-12 DIAGNOSIS — Z87891 Personal history of nicotine dependence: Secondary | ICD-10-CM | POA: Insufficient documentation

## 2012-10-12 DIAGNOSIS — R739 Hyperglycemia, unspecified: Secondary | ICD-10-CM

## 2012-10-12 DIAGNOSIS — R6889 Other general symptoms and signs: Secondary | ICD-10-CM | POA: Insufficient documentation

## 2012-10-12 DIAGNOSIS — J45909 Unspecified asthma, uncomplicated: Secondary | ICD-10-CM | POA: Insufficient documentation

## 2012-10-12 DIAGNOSIS — J3489 Other specified disorders of nose and nasal sinuses: Secondary | ICD-10-CM | POA: Insufficient documentation

## 2012-10-12 DIAGNOSIS — E1169 Type 2 diabetes mellitus with other specified complication: Secondary | ICD-10-CM | POA: Insufficient documentation

## 2012-10-12 DIAGNOSIS — J1189 Influenza due to unidentified influenza virus with other manifestations: Secondary | ICD-10-CM | POA: Insufficient documentation

## 2012-10-12 DIAGNOSIS — N390 Urinary tract infection, site not specified: Secondary | ICD-10-CM

## 2012-10-12 DIAGNOSIS — M549 Dorsalgia, unspecified: Secondary | ICD-10-CM | POA: Insufficient documentation

## 2012-10-12 DIAGNOSIS — R05 Cough: Secondary | ICD-10-CM | POA: Insufficient documentation

## 2012-10-12 DIAGNOSIS — IMO0001 Reserved for inherently not codable concepts without codable children: Secondary | ICD-10-CM | POA: Insufficient documentation

## 2012-10-12 DIAGNOSIS — Z79899 Other long term (current) drug therapy: Secondary | ICD-10-CM | POA: Insufficient documentation

## 2012-10-12 DIAGNOSIS — R059 Cough, unspecified: Secondary | ICD-10-CM | POA: Insufficient documentation

## 2012-10-12 DIAGNOSIS — E785 Hyperlipidemia, unspecified: Secondary | ICD-10-CM | POA: Insufficient documentation

## 2012-10-12 DIAGNOSIS — R5381 Other malaise: Secondary | ICD-10-CM | POA: Insufficient documentation

## 2012-10-12 DIAGNOSIS — R0989 Other specified symptoms and signs involving the circulatory and respiratory systems: Secondary | ICD-10-CM | POA: Insufficient documentation

## 2012-10-12 LAB — CBC WITH DIFFERENTIAL/PLATELET
Basophils Absolute: 0 10*3/uL (ref 0.0–0.1)
HCT: 36.2 % (ref 36.0–46.0)
Lymphocytes Relative: 11 % — ABNORMAL LOW (ref 12–46)
Lymphs Abs: 1 10*3/uL (ref 0.7–4.0)
Neutro Abs: 7 10*3/uL (ref 1.7–7.7)
Platelets: 255 10*3/uL (ref 150–400)
RBC: 4.55 MIL/uL (ref 3.87–5.11)
RDW: 12.8 % (ref 11.5–15.5)
WBC: 8.6 10*3/uL (ref 4.0–10.5)

## 2012-10-12 LAB — URINALYSIS, ROUTINE W REFLEX MICROSCOPIC
Glucose, UA: >1000 mg/dL — AB
Protein, ur: 100 mg/dL — AB
Specific Gravity, Urine: 1.021 (ref 1.005–1.030)
pH: 5.5 (ref 5.0–8.0)

## 2012-10-12 LAB — URINE MICROSCOPIC-ADD ON

## 2012-10-12 LAB — BASIC METABOLIC PANEL
CO2: 27 mEq/L (ref 19–32)
Chloride: 97 mEq/L (ref 96–112)
Glucose, Bld: 307 mg/dL — ABNORMAL HIGH (ref 70–99)
Sodium: 133 mEq/L — ABNORMAL LOW (ref 135–145)

## 2012-10-12 MED ORDER — DEXTROSE 5 % IV SOLN
1.0000 g | Freq: Once | INTRAVENOUS | Status: AC
  Administered 2012-10-12: 14:00:00 via INTRAVENOUS
  Filled 2012-10-12: qty 10

## 2012-10-12 MED ORDER — NITROFURANTOIN MONOHYD MACRO 100 MG PO CAPS: 100.0000 mg | ORAL_CAPSULE | Freq: Two times a day (BID) | ORAL | Status: DC

## 2012-10-12 MED ORDER — ACETAMINOPHEN 325 MG PO TABS
650.0000 mg | ORAL_TABLET | Freq: Once | ORAL | Status: AC
  Administered 2012-10-12: 650 mg via ORAL
  Filled 2012-10-12: qty 2

## 2012-10-12 MED ORDER — CIPROFLOXACIN HCL 500 MG PO TABS: 500.0000 mg | ORAL_TABLET | Freq: Two times a day (BID) | ORAL | Status: DC

## 2012-10-12 MED ORDER — KETOROLAC TROMETHAMINE 30 MG/ML IJ SOLN
30.0000 mg | Freq: Once | INTRAMUSCULAR | Status: AC
  Administered 2012-10-12: 30 mg via INTRAVENOUS
  Filled 2012-10-12: qty 1

## 2012-10-12 MED ORDER — SODIUM CHLORIDE 0.9 % IV BOLUS (SEPSIS)
1000.0000 mL | Freq: Once | INTRAVENOUS | Status: AC
  Administered 2012-10-12: 1000 mL via INTRAVENOUS

## 2012-10-12 NOTE — ED Provider Notes (Addendum)
History     CSN: [id number]  Arrival date & time 10/12/12  1158   First MD Initiated Contact with Patient 10/12/12 1204      Chief Complaint  Patient presents with  . Influenza  . Chills  . Fever    (Consider location/radiation/quality/duration/timing/severity/associated sxs/prior treatment) HPI Comments: Patient presents with a two-day history of fever, runny nose, chest congestion and myalgias. She denies any shortness of breath. She denies any headache. She's had some nausea and vomiting yesterday but denies any today she's been tolerating by mouth fluids today. She states her blood sugars have been higher than normal. She does state that she is taking her insulin as directed. She's been using Tylenol Mucinex without improvement of symptoms.  Patient is a 36 y.o. female presenting with flu symptoms and fever.  Influenza Pertinent negatives include no chest pain, no abdominal pain, no headaches and no shortness of breath.  Fever Primary symptoms of the febrile illness include fever, fatigue, cough and myalgias. Primary symptoms do not include headaches, shortness of breath, abdominal pain, nausea, vomiting, diarrhea, arthralgias or rash.  The myalgias are not associated with weakness.    Past Medical History  Diagnosis Date  . Asthma   . Diabetes mellitus   . Hyperlipidemia   . Tobacco abuse     Past Surgical History  Procedure Date  . Tubal ligation   . Cesarean section     Family History  Problem Relation Age of Onset  . Stroke Mother   . Diabetes Father   . Diabetes Mother   . Diabetes Maternal Grandmother   . Diabetes Maternal Grandfather   . Hypertension Mother   . Hypertension Father   . Cancer Paternal Grandfather     Prostate  . Aneurysm Mother     History  Substance Use Topics  . Smoking status: Former Smoker -- 0.3 packs/day for 1 years    Types: Cigarettes    Quit date: 09/02/2010  . Smokeless tobacco: Not on file  . Alcohol Use: Yes   Comment: occassionally    OB History    Grav Para Term Preterm Abortions TAB SAB Ect Mult Living                  Review of Systems  Constitutional: Positive for fever and fatigue. Negative for chills and diaphoresis.  HENT: Positive for congestion, rhinorrhea, sneezing and postnasal drip. Negative for neck pain.   Eyes: Negative.   Respiratory: Positive for cough. Negative for chest tightness and shortness of breath.   Cardiovascular: Negative for chest pain and leg swelling.  Gastrointestinal: Negative for nausea, vomiting, abdominal pain, diarrhea and blood in stool.  Genitourinary: Negative for frequency, hematuria, flank pain and difficulty urinating.  Musculoskeletal: Positive for myalgias and back pain. Negative for arthralgias.  Skin: Negative for rash.  Neurological: Negative for dizziness, speech difficulty, weakness, numbness and headaches.    Allergies  Review of patient's allergies indicates no known allergies.  Home Medications   Current Outpatient Rx  Name  Route  Sig  Dispense  Refill  . ALBUTEROL SULFATE HFA 108 (90 BASE) MCG/ACT IN AERS   Inhalation   Inhale 2 puffs into the lungs every 6 (six) hours as needed. wheezing         . INSULIN ASPART PROT & ASPART (70-30) 100 UNIT/ML  SUSP   Subcutaneous   Inject 20 Units into the skin 2 (two) times daily with a meal.         .  SIMVASTATIN 40 MG PO TABS   Oral   Take 1 tablet (40 mg total) by mouth daily at 6 PM.   30 tablet   11   . CIPROFLOXACIN HCL 500 MG PO TABS   Oral   Take 1 tablet (500 mg total) by mouth every 12 (twelve) hours.   20 tablet   0     BP 124/75  Temp 100.6 F (38.1 C) (Oral)  Resp 16  SpO2 100%  LMP 10/05/2012  Physical Exam  Constitutional: She is oriented to person, place, and time. She appears well-developed and well-nourished.  HENT:  Head: Normocephalic and atraumatic.  Right Ear: External ear normal.  Left Ear: External ear normal.  Mouth/Throat: Oropharynx  is clear and moist.  Eyes: Conjunctivae normal are normal. Pupils are equal, round, and reactive to light.  Neck: Normal range of motion. Neck supple.       No meningeal signs  Cardiovascular: Normal rate, regular rhythm and normal heart sounds.   Pulmonary/Chest: Effort normal and breath sounds normal. No respiratory distress. She has no wheezes. She has no rales. She exhibits no tenderness.  Abdominal: Soft. Bowel sounds are normal. There is no tenderness. There is no rebound and no guarding.  Musculoskeletal: Normal range of motion. She exhibits no edema.  Lymphadenopathy:    She has no cervical adenopathy.  Neurological: She is alert and oriented to person, place, and time.  Skin: Skin is warm and dry. No rash noted.  Psychiatric: She has a normal mood and affect.    ED Course  Procedures (including critical care time)  Results for orders placed during the hospital encounter of 10/12/12  CBC WITH DIFFERENTIAL      Component Value Range   WBC 8.6  4.0 - 10.5 K/uL   RBC 4.55  3.87 - 5.11 MIL/uL   Hemoglobin 11.1 (*) 12.0 - 15.0 g/dL   HCT 36.2  36.0 - 46.0 %   MCV 79.6  78.0 - 100.0 fL   MCH 24.4 (*) 26.0 - 34.0 pg   MCHC 30.7  30.0 - 36.0 g/dL   RDW 12.8  11.5 - 15.5 %   Platelets 255  150 - 400 K/uL   Neutrophils Relative 81 (*) 43 - 77 %   Neutro Abs 7.0  1.7 - 7.7 K/uL   Lymphocytes Relative 11 (*) 12 - 46 %   Lymphs Abs 1.0  0.7 - 4.0 K/uL   Monocytes Relative 7  3 - 12 %   Monocytes Absolute 0.6  0.1 - 1.0 K/uL   Eosinophils Relative 0  0 - 5 %   Eosinophils Absolute 0.0  0.0 - 0.7 K/uL   Basophils Relative 0  0 - 1 %   Basophils Absolute 0.0  0.0 - 0.1 K/uL  BASIC METABOLIC PANEL      Component Value Range   Sodium 133 (*) 135 - 145 mEq/L   Potassium 3.6  3.5 - 5.1 mEq/L   Chloride 97  96 - 112 mEq/L   CO2 27  19 - 32 mEq/L   Glucose, Bld 307 (*) 70 - 99 mg/dL   BUN 12  6 - 23 mg/dL   Creatinine, Ser 1.01  0.50 - 1.10 mg/dL   Calcium 9.3  8.4 - 10.5 mg/dL    GFR calc non Af Amer 70 (*) >90 mL/min   GFR calc Af Amer 81 (*) >90 mL/min  URINALYSIS, ROUTINE W REFLEX MICROSCOPIC      Component Value Range  Color, Urine YELLOW  YELLOW   APPearance TURBID (*) CLEAR   Specific Gravity, Urine 1.021  1.005 - 1.030   pH 5.5  5.0 - 8.0   Glucose, UA >1000 (*) NEGATIVE mg/dL   Hgb urine dipstick LARGE (*) NEGATIVE   Bilirubin Urine NEGATIVE  NEGATIVE   Ketones, ur NEGATIVE  NEGATIVE mg/dL   Protein, ur 100 (*) NEGATIVE mg/dL   Urobilinogen, UA 0.2  0.0 - 1.0 mg/dL   Nitrite POSITIVE (*) NEGATIVE   Leukocytes, UA MODERATE (*) NEGATIVE  URINE MICROSCOPIC-ADD ON      Component Value Range   Squamous Epithelial / LPF MANY (*) RARE   WBC, UA TOO NUMEROUS TO COUNT  <3 WBC/hpf   RBC / HPF 7-10  <3 RBC/hpf   Bacteria, UA MANY (*) RARE   Casts GRANULAR CAST (*) NEGATIVE   Urine-Other RARE YEAST     Dg Chest 2 View  10/12/2012  *RADIOLOGY REPORT*  Clinical Data: Fever and vomiting  CHEST - 2 VIEW  Comparison: None.  Findings: Normal mediastinum and cardiac silhouette.  Normal pulmonary  vasculature.  No evidence of effusion, infiltrate, or pneumothorax.  No acute bony abnormality.  Mild sigmoid scoliosis  IMPRESSION:  1.  No acute cardiopulmonary findings.  2.  Mild scoliosis.   Original Report Authenticated By: Suzy Bouchard, M.D.     Date: 10/12/2012  Rate: 114  Rhythm: sinus tachycardia  QRS Axis: normal  Intervals: normal  ST/T Wave abnormalities: nonspecific ST/T changes  Conduction Disutrbances:none  Narrative Interpretation:   Old EKG Reviewed: unchanged     1. UTI (lower urinary tract infection)   2. Hyperglycemia       MDM  Pt is nontoxic appearing.  Has hypergylemia and UTI.  Will tx UTI.  Advised to continue her insulin and check her sugars frequently.  Given rocephin here and will start on cipro.  Urine sent for culture.  No evidence of DKA, sepsis. No vomiting today.  Has appt with her PMD at [facility] on  Monday.        Malvin Johns, MD 10/12/12 [place], MD 10/12/12 [phone]

## 2012-10-12 NOTE — ED Notes (Signed)
Pt discharged to home with family. NAD.  

## 2012-10-12 NOTE — ED Notes (Signed)
Coming from home; c/o flu like symptoms; taking tylenol and mucinex and now has ran out of meds; temp 102 F. Non productive cough.

## 2012-10-15 LAB — URINE CULTURE: Colony Count: >=100000

## 2012-10-16 ENCOUNTER — Ambulatory Visit (INDEPENDENT_AMBULATORY_CARE_PROVIDER_SITE_OTHER): Payer: Medicaid Other | Admitting: Internal Medicine

## 2012-10-16 ENCOUNTER — Encounter: Payer: Self-pay | Admitting: Internal Medicine

## 2012-10-16 VITALS — BP 132/85 | HR 93 | Temp 95.5°F | Wt 225.5 lb

## 2012-10-16 DIAGNOSIS — J309 Allergic rhinitis, unspecified: Secondary | ICD-10-CM | POA: Insufficient documentation

## 2012-10-16 DIAGNOSIS — N10 Acute tubulo-interstitial nephritis: Secondary | ICD-10-CM | POA: Insufficient documentation

## 2012-10-16 DIAGNOSIS — IMO0001 Reserved for inherently not codable concepts without codable children: Secondary | ICD-10-CM

## 2012-10-16 DIAGNOSIS — E1165 Type 2 diabetes mellitus with hyperglycemia: Secondary | ICD-10-CM

## 2012-10-16 MED ORDER — LORATADINE 10 MG PO TABS: 10.0000 mg | ORAL_TABLET | Freq: Every day | ORAL | Status: DC

## 2012-10-16 MED ORDER — METFORMIN HCL ER 500 MG PO TB24: 500.0000 mg | ORAL_TABLET | Freq: Every day | ORAL | Status: DC

## 2012-10-16 NOTE — Patient Instructions (Addendum)
General Instructions:  Please return to clinic in 1 months  Treatment Goals:  Goals (1 Years of Data) as of 10/16/2012          As of Today 10/12/12 10/12/12 10/12/12 09/27/12     Blood Pressure    . Blood Pressure < 140/90  132/85 121/66 118/56 124/75 137/90     Result Component    . HEMOGLOBIN A1C < 7.0          . LDL CALC < 100            Progress Toward Treatment Goals:  Treatment Goal 10/16/2012  Hemoglobin A1C unchanged    Self Care Goals & Plans:  Self Care Goal 10/16/2012  Manage my medications take my medicines as prescribed; bring my medications to every visit  Monitor my health bring my glucose meter and log to each visit; check my feet daily; keep track of my blood glucose  Eat healthy foods eat fruit for snacks and desserts; drink diet soda or water instead of juice or soda; eat smaller portions; eat more vegetables; eat baked foods instead of fried foods; eat foods that are low in salt  Be physically active take a walk every day    Home Blood Glucose Monitoring 10/16/2012  Check my blood sugar 2 times a day  When to check my blood sugar before breakfast; before dinner     Care Management & Community Referrals:  Referral 10/16/2012  Referrals made for care management support diabetes educator

## 2012-10-16 NOTE — Progress Notes (Signed)
Internal Medicine Clinic Visit    HPI:  Jane [name] is a 36 y.o. year old female who presents for hospital f/u. She was admitted on 11/12 with hyperglycemia and nasal congestion, DC'd 11/14. Then she was seen again on 11/29 in the ED for UTI, given cephalexin which she has been taking. She does have some residual L flank pain which is improving.  Since her discharge, she has been doing better with her diet, carb counting, and has been taking her home regimen of Novolog 70/30 20u BID, though she did increase the dose to 25u BID yesterday as her BS was high at home (400s). She sometimes takes her insulin without eating and had one recorded episode of hypoglycemia (BS 43). She also delays taking her insulin until she feels like eating, which may be later in the day or later at night. She is starting to eat more salads, more fruits and veggies. Brought her meter but problems with downloading. Had one one episode of 58 BS but all the others are between 268-444. She was asymptomatic during her hypoglycemic episodes.  Recent nasal congestion, itchy eyes. Has been going on for 4 weeks. Has a history of sinus problems. Is not on allergy medication at home.    Past Medical History  Diagnosis Date  . Asthma   . Diabetes mellitus   . Hyperlipidemia   . Tobacco abuse     Past Surgical History  Procedure Date  . Tubal ligation   . Cesarean section      ROS:  A complete review of systems was otherwise negative, except as noted in the HPI.  Allergies: Review of patient's allergies indicates no known allergies.  Medications: Current Outpatient Prescriptions  Medication Sig Dispense Refill  . albuterol (PROVENTIL HFA;VENTOLIN HFA) 108 (90 BASE) MCG/ACT inhaler Inhale 2 puffs into the lungs every 6 (six) hours as needed. wheezing      . insulin aspart protamine-insulin aspart (NOVOLOG 70/30) (70-30) 100 UNIT/ML injection Inject 20 Units into the skin 2 (two) times daily with a meal.      .  nitrofurantoin, macrocrystal-monohydrate, (MACROBID) 100 MG capsule Take 1 capsule (100 mg total) by mouth 2 (two) times daily.  20 capsule  0  . simvastatin (ZOCOR) 40 MG tablet Take 1 tablet (40 mg total) by mouth daily at 6 PM.  30 tablet  11    History   Social History  . Marital Status: Legally Separated    Spouse Name: N/A    Number of Children: N/A  . Years of Education: N/A   Occupational History  . Not on file.   Social History Main Topics  . Smoking status: Former Smoker -- 0.3 packs/day for 1 years    Types: Cigarettes    Quit date: 09/02/2010  . Smokeless tobacco: Not on file  . Alcohol Use: Yes     Comment: occassionally  . Drug Use: No  . Sexually Active: Not on file   Other Topics Concern  . Not on file   Social History Narrative  . No narrative on file    family history includes Aneurysm in her mother; Cancer in her paternal grandfather; Diabetes in her father, maternal grandfather, maternal grandmother, and mother; Hypertension in her father and mother; and Stroke in her mother.  Physical Exam Blood pressure 132/85, pulse 93, temperature 95.5 F (35.3 C), temperature source Oral, weight 225 lb 8 oz (102.286 kg), last menstrual period 10/05/2012, SpO2 99.00%. General:  No acute distress, alert and  oriented x 3, well-appearing  HEENT:  PERRL, EOMI, no lymphadenopathy, moist mucous membranes, no exudate, OP clear Cardiovascular:  Regular rate and rhythm, no murmurs, rubs or gallops Respiratory:  Clear to auscultation bilaterally, no wheezes, rales, or rhonchi Abdomen:  Soft, obese, nondistended, nontender, normoactive bowel sounds Extremities:  Warm and well-perfused, no clubbing, cyanosis, or edema.  Skin: Warm, dry, no rashes Neuro: Not anxious appearing, no depressed mood, normal affect  Labs: Lab Results  Component Value Date   CREATININE 1.01 10/12/2012   BUN 12 10/12/2012   NA 133* 10/12/2012   K 3.6 10/12/2012   CL 97 10/12/2012   CO2 27  10/12/2012   Lab Results  Component Value Date   WBC 8.6 10/12/2012   HGB 11.1* 10/12/2012   HCT 36.2 10/12/2012   MCV 79.6 10/12/2012   PLT 255 10/12/2012      Assessment and Plan:    FOLLOWUP: Jane [name] will follow back up in our clinic in approximately  1 month. Jane [name] knows to call out clinic in the meantime with any questions or new issues.   Patient was seen and evaluated by Santa Lighter, MD,  and Bertha Stakes, MD Attending Physician.

## 2012-10-16 NOTE — Assessment & Plan Note (Signed)
Patient seen in ED for UTI 11/29, cultures grew pan sensitive E.Coli.  She is complaining of some residual L sided flank pain, which makes me think she actually progressed to pyelonephritis. She states her symptoms are resolving and she denies fever or chills.  -cont 7d course of cephalexin -patient knows to call or come to clinic if symptoms do not resolve or worsen

## 2012-10-16 NOTE — Assessment & Plan Note (Signed)
Hemoglobin A1C  Date Value Range Status  09/26/2012 17.0* <5.7 % Final     (NOTE)                                                                               According to the ADA Clinical Practice Recommendations for 2011, when     HbA1c is used as a screening test:      >=6.5%   Diagnostic of Diabetes Mellitus               (if abnormal result is confirmed)     5.7-6.4%   Increased risk of developing Diabetes Mellitus     References:Diagnosis and Classification of Diabetes Mellitus,Diabetes     [id number] 1):S62-S69 and Standards of Medical Care in             Diabetes - 2011,Diabetes [id number] (Suppl 1):S11-S61.  10/17/2011 12.2   Final  07/22/2011 15.9* <5.7 % Final     (NOTE)     Result repeated and verified.                                                                               According to the ADA Clinical Practice Recommendations for 2011, when     HbA1c is used as a screening test:      >=6.5%   Diagnostic of Diabetes Mellitus               (if abnormal result is confirmed)     5.7-6.4%   Increased risk of developing Diabetes Mellitus     References:Diagnosis and Classification of Diabetes Mellitus,Diabetes     [id number] 1):S62-S69 and Standards of Medical Care in             Diabetes - 2011,Diabetes [id number] (Suppl 1):S11-S61.     Assessment:  Diabetes control: poor control (HgbA1C >9%)  Progress toward A1C goal:  unchanged  Comments: poorly controlled  Plan:  Medications:  continue current medications, patient to add metformin 500 XR QD for adjunctive therapy.   Home glucose monitoring:   Frequency: 2 times a day   Timing: before breakfast;before dinner  Instruction/counseling given: reminded to bring blood glucose meter & log to each visit, reminded to bring medications to each visit, discussed foot care, discussed the need for weight loss and discussed diet  Educational resources provided: brochure;handout  Self management tools  provided: copy of home glucose meter download;home glucose logbook  Other plans:   Discussed at length with patient the importance of taking insulin with a meal. She must take her insulin twice per day and take it with a meal, or else her BS will drop too quickly. She understands this and agreed to take her meds with meal as prescribed. She was commended for coming in to her appointment today and staying on her insulin since discharge as she had previously been not taking insulin the 2  months prior. Would like to eventually go up on her 70/30 as she was previously taking 30-40 units BID, however, given her hypoglycemia, we will start with conservative changes for now until patient demonstrates that she can handle increased dose.  She did see [name] Plyler during this visit to discuss carb counting and she states that was very helpful.  Will see her back in one month for f/u, sooner if needed.

## 2012-10-16 NOTE — ED Notes (Signed)
+   Urine Chart sent to EDP office for review. 

## 2012-10-16 NOTE — Assessment & Plan Note (Signed)
Patient with itchy eyes, runny nose, congestion over past 4 weeks. May have a URI but given length of symptoms and itchy eyes, more concern for allergic rhinitis.  Will start loratadine QD, patient can get it generic over the counter. Take daily for 4 weeks then return to clinic for reassessment.

## 2012-10-19 ENCOUNTER — Telehealth: Payer: Self-pay | Admitting: *Deleted

## 2012-10-19 DIAGNOSIS — E1165 Type 2 diabetes mellitus with hyperglycemia: Secondary | ICD-10-CM

## 2012-10-19 NOTE — Telephone Encounter (Addendum)
Call from pt stating her medicaid has been approved and she now wishes to have rx for dm testing supplies called into pharmacy (uses the new Product/process development scientist on [address].  Pt also inquires about being on a "better insulin" now that she has medicaid.  Will forward note to pcp for review.Jane Hidden Cassady12/6/20133:04 PM

## 2012-10-20 NOTE — ED Notes (Signed)
Chart returned from [place] office. Per Carylon Perches PA-C, D/C'd with appropriate abx.

## 2012-10-22 NOTE — Telephone Encounter (Signed)
Dr. Sissy Hoff is PCP. Thanks!

## 2012-10-23 ENCOUNTER — Other Ambulatory Visit: Payer: Self-pay | Admitting: Dietician

## 2012-10-23 DIAGNOSIS — E1165 Type 2 diabetes mellitus with hyperglycemia: Secondary | ICD-10-CM

## 2012-10-23 MED ORDER — GLUCOSE BLOOD VI STRP: ORAL_STRIP | Status: DC

## 2012-10-23 MED ORDER — ACCU-CHEK NANO SMARTVIEW W/DEVICE KIT: 1.0000 | PACK | Freq: Three times a day (TID) | Status: DC

## 2012-10-23 MED ORDER — ACCU-CHEK FASTCLIX LANCETS MISC: 1.0000 | Freq: Three times a day (TID) | Status: DC

## 2012-10-23 NOTE — Telephone Encounter (Signed)
Patient requests new meter and supplies since she now has Medicaid : accu chek nano, fast click, smartview. Reports she has been checking her blood sugar 3x a day  Also requests Another insulin- made appointment with CDE for this Thursday to review food and [facility] record to evaluate current insulin effectiveness. Will discuss with her PCP

## 2012-10-24 ENCOUNTER — Other Ambulatory Visit: Payer: Self-pay | Admitting: *Deleted

## 2012-10-24 MED ORDER — INSULIN ASPART PROT & ASPART (70-30 MIX) 100 UNIT/ML ~~LOC~~ SUSP: 20.0000 [IU] | Freq: Two times a day (BID) | SUBCUTANEOUS | Status: DC

## 2012-10-25 ENCOUNTER — Ambulatory Visit: Payer: Self-pay | Admitting: Dietician

## 2012-10-26 ENCOUNTER — Ambulatory Visit (INDEPENDENT_AMBULATORY_CARE_PROVIDER_SITE_OTHER): Payer: Medicaid Other | Admitting: Dietician

## 2012-10-26 DIAGNOSIS — E1165 Type 2 diabetes mellitus with hyperglycemia: Secondary | ICD-10-CM

## 2012-10-26 DIAGNOSIS — IMO0001 Reserved for inherently not codable concepts without codable children: Secondary | ICD-10-CM

## 2012-10-26 NOTE — Progress Notes (Signed)
Diabetes Self-Management Training (DSMT)  Initial Visit  10/26/2012 [name], identified by name and date of birth, is a 36 y.o. female with Type 2 Diabetes. Year of diabetes diagnosis: > 10 years Other persons present: spouse/SO- Jane [name]  ASSESSMENT Patient concerns are Monitoring.  Last menstrual period 10/05/2012. There is no height or weight on file to calculate BMI. Lab Results  Component Value Date   LDLCALC 124* 09/26/2012   Lab Results  Component Value Date   HGBA1C 17.0* 09/26/2012      Medications See Medications list.  Is interested in learning more and Needs skills/knowledge review  Has 2 vials of Novolog Mix 70/30 with her today was going to start back taking today   Exercise Plan Doing ADLs for 60 minutesa day.   Self-Monitoring Monitor: accu check nano Frequency of testing: 3 times/day 270 here in office with no insulin for [past few days Hyperglycemia: Yes Daily Hypoglycemia: No   Meal Planning Some knowledge   Assessment comments:patient states she is ready to learn more about her diabetes.    INDIVIDUAL DIABETES EDUCATION PLAN:  Diabetes disease state Nutrition management Physical activity and exercise Medication Monitoring Acute complication: Chronic complications _______________________________________________________________________  Intervention TOPICS COVERED TODAY:  Medication  Reviewed patients medication for diabetes, action, purpose, timing of dose and side effects. Monitoring  Taught/evaluated SMBG with accu check nano meter. Purpose and frequency of SMBG. Taught/discussed recording of test results and interpretation of SMBG.  PATIENTS GOALS/PLAN (copy and paste in patient instructions so patient receives a copy): 1.  Learning Objective:       state action of own insulin/medicine 2.  Behavioral Objective:         Monitoring: To identify blood glucose trends, I will test blood glucose pre and post meals for 3 days Never  0%  Personalized Follow-Up Plan for Ongoing Self Management Support:  [place], friends, family and CDE visits ______________________________________________________________________   Outcomes Expected outcomes: Demonstrated interest in learning.Expect positive changes in lifestyle. Self-care Barriers: Lack of transportation, Lack of material resources, Coping skills Education material provided: yes Patient to contact team via Phone if problems or questions.  Time in: 1200     Time out: 1230  Future DSMT - 4-6 wks   Jane [name], Jane [name]

## 2012-10-30 ENCOUNTER — Ambulatory Visit (INDEPENDENT_AMBULATORY_CARE_PROVIDER_SITE_OTHER): Payer: Medicaid Other | Admitting: Internal Medicine

## 2012-10-30 ENCOUNTER — Ambulatory Visit: Payer: Medicaid Other | Admitting: Internal Medicine

## 2012-10-30 ENCOUNTER — Other Ambulatory Visit: Payer: Self-pay | Admitting: Internal Medicine

## 2012-10-30 ENCOUNTER — Encounter: Payer: Self-pay | Admitting: Internal Medicine

## 2012-10-30 VITALS — BP 140/94 | HR 88 | Temp 97.5°F | Ht 65.0 in | Wt 228.4 lb

## 2012-10-30 DIAGNOSIS — E1165 Type 2 diabetes mellitus with hyperglycemia: Secondary | ICD-10-CM

## 2012-10-30 DIAGNOSIS — IMO0001 Reserved for inherently not codable concepts without codable children: Secondary | ICD-10-CM

## 2012-10-30 DIAGNOSIS — IMO0002 Reserved for concepts with insufficient information to code with codable children: Secondary | ICD-10-CM

## 2012-10-30 DIAGNOSIS — R109 Unspecified abdominal pain: Secondary | ICD-10-CM

## 2012-10-30 DIAGNOSIS — J309 Allergic rhinitis, unspecified: Secondary | ICD-10-CM

## 2012-10-30 DIAGNOSIS — E785 Hyperlipidemia, unspecified: Secondary | ICD-10-CM

## 2012-10-30 DIAGNOSIS — N390 Urinary tract infection, site not specified: Secondary | ICD-10-CM

## 2012-10-30 LAB — URINALYSIS, MICROSCOPIC ONLY

## 2012-10-30 LAB — URINALYSIS, ROUTINE W REFLEX MICROSCOPIC
Ketones, ur: NEGATIVE mg/dL
Nitrite: POSITIVE — AB
Urobilinogen, UA: 0.2 mg/dL (ref 0.0–1.0)
pH: 5.5 (ref 5.0–8.0)

## 2012-10-30 LAB — GLUCOSE, CAPILLARY: Glucose-Capillary: 234 mg/dL — ABNORMAL HIGH (ref 70–99)

## 2012-10-30 LAB — POCT URINALYSIS DIPSTICK
Spec Grav, UA: >=1.03
Urobilinogen, UA: 0.2
pH, UA: 5.5

## 2012-10-30 MED ORDER — CIPROFLOXACIN HCL 500 MG PO TABS: 500.0000 mg | ORAL_TABLET | Freq: Two times a day (BID) | ORAL | Status: AC

## 2012-10-30 MED ORDER — LORATADINE 10 MG PO TABS: 10.0000 mg | ORAL_TABLET | Freq: Every day | ORAL | Status: DC

## 2012-10-30 MED ORDER — SIMVASTATIN 40 MG PO TABS: 40.0000 mg | ORAL_TABLET | Freq: Every day | ORAL | Status: DC

## 2012-10-30 NOTE — Assessment & Plan Note (Signed)
Pertinent Labs: Liver Function Tests:    Component Value Date/Time   AST 14 11/04/2011 1122   ALT 9 11/04/2011 1122   ALKPHOS 79 11/04/2011 1122   BILITOT 0.3 11/04/2011 1122   PROT 7.3 11/04/2011 1122   ALBUMIN 4.2 11/04/2011 1122    Lipid Panel:     Component Value Date/Time   CHOL 206* 09/26/2012 0540   TRIG 198* 09/26/2012 0540   HDL 42 09/26/2012 0540   CHOLHDL 4.9 09/26/2012 0540   VLDL 40 09/26/2012 0540   LDLCALC 124* 09/26/2012 0540     Assessment: Goal LDL (per ATP guidelines): < 100 mg/dL  Disease Control: not controlled  Progress toward goals: unchanged  Barriers to meeting goals: no barriers identified    The patient has not been taking her prescribed simvastatin. She states that she was unaware that she needs to be on this medication.    Plan:  Refilled her simvastatin today.   Will need followup lipid panel in approximately 6 weeks to assess if she has adequate control of her cholesterol levels.

## 2012-10-30 NOTE — Progress Notes (Signed)
Patient: Jane [name]   MRN: [id number]  DOB: 01-19-1976  PCP: Neta Ehlers, MD   Subjective:    HPI: Jane [name] is a 36 y.o. female with a PMHx of uncontrolled DM2 (A1c > 14), recent hospitalization for hyperglycemia without ketosis in 09/2012, recently diagnosed pyelonephritis (completed 7 day course of Keflex started on 12/10), who presented to clinic today for the following:  1) Urinary complaint - Patient describes that since 09/2012 she has had ongoing issues with flank pain. During her 09/2012 hospital course, she was noted to have bacteriuria but was asymptomatic at that time, therefore was not treated with antibiotics. She was then seen in the ED on 10/12/2012, diagnosed with pansensitive E. Coli UTI, for which he is treated with Rocephin x 1, and discharged home with a keflex 500mg  BID course of 7 days. She was then seen in clinic on 10/16/2012, at which time there was concern for possible pyelonephritis in light of the left flank pain, she was just recommended to continue this antibiotic for completion of this course.   Today, the patient indicates that she completed her course of antibiotics without missing any doses. However, she continues to have left flank pain. The patient denies dysuria, hematuria, increased frequency of urination (she actually has less She denies any fevers, chills, nausea, vomiting, diarrhea, abdominal pain. She states that her blood sugars have been running in the 200-300s mg/dL, however she does have an A1c of > 14 - therefore, this actually of better control than prior.     Of note, the patient is currently on her menstrual cycle.    Review of Systems: Per HPI.   Current Outpatient Medications: Medication Sig  . ACCU-CHEK FASTCLIX LANCETS MISC 1 each by Does not apply route 3 (three) times daily before meals. Dx code- 250.00  . albuterol (PROVENTIL HFA;VENTOLIN HFA) 108 (90 BASE) MCG/ACT inhaler Inhale 2 puffs into the lungs every 6 (six) hours  as needed. wheezing  . Blood Glucose Monitoring Suppl (ACCU-CHEK AVIVA PLUS) W/DEVICE KIT Use to test blood glucose 3 times daily. Dx: 250.02  . Blood Glucose Monitoring Suppl (ACCU-CHEK NANO SMARTVIEW) W/DEVICE KIT 1 each by Does not apply route 3 (three) times daily before meals. Dx code 250.00  . glucose blood (ACCU-CHEK AVIVA PLUS) test strip 1 each by Other route as needed. Use as instructed  . glucose blood (ACCU-CHEK SMARTVIEW) test strip Check blood sugar 3x a day before meals dx code 250.00  . insulin aspart protamine-insulin aspart (NOVOLOG 70/30) (70-30) 100 UNIT/ML injection Inject 30 Units into the skin 2 (two) times daily with a meal.  . loratadine (CLARITIN) 10 MG tablet Take 1 tablet (10 mg total) by mouth daily.  . metFORMIN (GLUCOPHAGE XR) 500 MG 24 hr tablet Take 1 tablet (500 mg total) by mouth daily with breakfast.  . simvastatin (ZOCOR) 40 MG tablet Take 1 tablet (40 mg total) by mouth daily at 6 PM.     No Known Allergies  Past Medical History  Diagnosis Date  . Asthma   . Diabetes mellitus   . Hyperlipidemia   . Tobacco abuse     Past Surgical History  Procedure Date  . Tubal ligation   . Cesarean section      Objective:    Physical Exam: Filed Vitals:   10/30/12 1026  BP: 140/94  Pulse: 88  Temp: 97.5 F (36.4 C)     General: Vital signs reviewed and noted. Well-developed, well-nourished, in no  acute distress; alert, appropriate and cooperative throughout examination.  Head: Normocephalic, atraumatic.  Lungs:  Normal respiratory effort. Clear to auscultation BL without crackles or wheezes.  Heart: RRR. S1 and S2 normal without gallop, rubs. No murmur.  Abdomen:  BS normoactive. Soft, Nondistended, non-tender.  No masses or organomegaly.  Extremities: No pretibial edema. Back - left flank pain noted.     Assessment/ Plan:    Case and plan of care discussed with attending physician, Dr. Janell Quiet.

## 2012-10-30 NOTE — Patient Instructions (Addendum)
General Instructions:  Please follow-up at the clinic in 2 weeks with your PCP, at which time we will reevaluate your diabetes and urine issues - OR, please follow-up in the clinic sooner if needed.  There have not been changes in your medications.    If you have worsening of your flank pain, have abdominal pain, nausea, vomiting, fevers, chills - come back to clinic or go to ED immediately.  You are getting labs today, if they are abnormal I will give you a call.   If you have been started on new medication(s), and you develop symptoms concerning for allergic reaction, including, but not limited to, throat closing, tongue swelling, rash, please stop the medication immediately and call the clinic at [phone], and go to the ER.  If you are diabetic, please bring your meter to your next visit.  If symptoms worsen, or new symptoms arise, please call the clinic or go to the ER.  PLEASE BRING ALL OF YOUR MEDICATIONS  IN A BAG TO YOUR NEXT APPOINTMENT   Treatment Goals:  Goals (1 Years of Data) as of 10/30/2012          As of Today 10/16/12 10/12/12 10/12/12 10/12/12     Blood Pressure    . Blood Pressure < 140/90  140/94 132/85 121/66 118/56 124/75     Result Component    . HEMOGLOBIN A1C < 7.0          . LDL CALC < 100            Progress Toward Treatment Goals:  Treatment Goal 10/16/2012  Hemoglobin A1C unchanged    Self Care Goals & Plans:  Self Care Goal 10/16/2012  Manage my medications take my medicines as prescribed; bring my medications to every visit  Monitor my health bring my glucose meter and log to each visit; check my feet daily; keep track of my blood glucose  Eat healthy foods eat fruit for snacks and desserts; drink diet soda or water instead of juice or soda; eat smaller portions; eat more vegetables; eat baked foods instead of fried foods; eat foods that are low in salt  Be physically active take a walk every day    Home Blood Glucose Monitoring 10/16/2012   Check my blood sugar 2 times a day  When to check my blood sugar before breakfast; before dinner     Care Management & Community Referrals:  Referral 10/16/2012  Referrals made for care management support diabetes educator

## 2012-10-30 NOTE — Assessment & Plan Note (Signed)
Assessment: Patient continues to have complaints of flank pain today. There was a lot of confusion during the visit about how many courses of antibiotics she had completed because in the ED records, seems that 3 different antibiotics were sent at the same time.   Plan:      Were waiting for full urinalysis during our visit - therefore, patient was informed that she would be called with results.  Will ask triage to call pt and inform her that the full urinalysis showed low contamination, and showed nitrites and leukocytes. Therefore, with the combination of information provided, we will need to go ahead and treat her with a 7 day course of ciprofloxacin.   This will be sent to her pharmacy.

## 2012-10-30 NOTE — Assessment & Plan Note (Signed)
Assessment: We did not address the patient's diabetes today as she was close to 20 minutes late for appointment. However, the patient is requesting for increased diabetic supplies allow her to test 6 times a day.  Plan:      I informed the patient that she will need to address this with her PCP.

## 2012-10-31 ENCOUNTER — Ambulatory Visit: Payer: Self-pay | Admitting: Dietician

## 2012-10-31 ENCOUNTER — Telehealth: Payer: Self-pay | Admitting: *Deleted

## 2012-10-31 NOTE — Telephone Encounter (Signed)
Pt called to inform her that an antibiotic was at her pharmacy.  No answer but message left to call clinic. Pharmacy called and they will also call the patient. Med has not been picked up.

## 2012-11-01 LAB — URINE CULTURE

## 2012-11-01 NOTE — Telephone Encounter (Signed)
Talked with pt and she will get antibiotic today.  Also informed her about using two forms of B/C.  She voices understanding.

## 2012-11-02 NOTE — Progress Notes (Signed)
Quick Note:  Already has been started on Ciprofloxacin for 7 day course. ______

## 2012-11-05 ENCOUNTER — Ambulatory Visit: Payer: Medicaid Other | Admitting: Internal Medicine

## 2012-11-15 ENCOUNTER — Encounter: Payer: Self-pay | Admitting: *Deleted

## 2012-11-15 ENCOUNTER — Encounter: Payer: Medicaid Other | Admitting: Internal Medicine

## 2012-11-22 ENCOUNTER — Encounter: Payer: Medicaid Other | Admitting: Internal Medicine

## 2012-12-23 ENCOUNTER — Other Ambulatory Visit: Payer: Self-pay | Admitting: Internal Medicine

## 2012-12-29 ENCOUNTER — Other Ambulatory Visit: Payer: Self-pay

## 2013-03-19 ENCOUNTER — Other Ambulatory Visit: Payer: Self-pay | Admitting: *Deleted

## 2013-03-20 MED ORDER — INSULIN ASPART PROT & ASPART (70-30 MIX) 100 UNIT/ML ~~LOC~~ SUSP: 20.0000 [IU] | Freq: Two times a day (BID) | SUBCUTANEOUS | Status: DC

## 2013-03-29 ENCOUNTER — Emergency Department (HOSPITAL_COMMUNITY)
Admission: EM | Admit: 2013-03-29 | Discharge: 2013-03-29 | Disposition: A | Discharge status: Home / Self Care | Payer: Medicaid Other | Attending: Emergency Medicine | Admitting: Emergency Medicine

## 2013-03-29 ENCOUNTER — Encounter (HOSPITAL_COMMUNITY): Payer: Self-pay | Admitting: *Deleted

## 2013-03-29 ENCOUNTER — Emergency Department (HOSPITAL_COMMUNITY): Payer: Medicaid Other

## 2013-03-29 DIAGNOSIS — M25469 Effusion, unspecified knee: Secondary | ICD-10-CM | POA: Insufficient documentation

## 2013-03-29 DIAGNOSIS — G8921 Chronic pain due to trauma: Secondary | ICD-10-CM | POA: Insufficient documentation

## 2013-03-29 DIAGNOSIS — E119 Type 2 diabetes mellitus without complications: Secondary | ICD-10-CM | POA: Insufficient documentation

## 2013-03-29 DIAGNOSIS — M25462 Effusion, left knee: Secondary | ICD-10-CM

## 2013-03-29 DIAGNOSIS — Z79899 Other long term (current) drug therapy: Secondary | ICD-10-CM | POA: Insufficient documentation

## 2013-03-29 DIAGNOSIS — J45909 Unspecified asthma, uncomplicated: Secondary | ICD-10-CM | POA: Insufficient documentation

## 2013-03-29 DIAGNOSIS — E785 Hyperlipidemia, unspecified: Secondary | ICD-10-CM | POA: Insufficient documentation

## 2013-03-29 DIAGNOSIS — Z87891 Personal history of nicotine dependence: Secondary | ICD-10-CM | POA: Insufficient documentation

## 2013-03-29 DIAGNOSIS — Z8709 Personal history of other diseases of the respiratory system: Secondary | ICD-10-CM | POA: Insufficient documentation

## 2013-03-29 DIAGNOSIS — Z794 Long term (current) use of insulin: Secondary | ICD-10-CM | POA: Insufficient documentation

## 2013-03-29 MED ORDER — NAPROXEN 500 MG PO TABS: 500.0000 mg | ORAL_TABLET | Freq: Two times a day (BID) | ORAL | Status: DC

## 2013-03-29 MED ORDER — TRAMADOL HCL 50 MG PO TABS: 50.0000 mg | ORAL_TABLET | Freq: Four times a day (QID) | ORAL | Status: DC | PRN
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
## 2013-03-29 NOTE — Discharge Instructions (Signed)
Your x-ray showed a joint effusion. This could be due to an internal injury such as possible meniscus tear. Keep knee elevated wile at home. Ice for 20 min at a time, several times a day. ACE wrap for swelling when up and walking around. Naprosyn for pain and inflammation. Ultram for severe pain. Follow up with your primary care doctor.    Knee Effusion The medical term for having fluid in your knee is effusion. This is often due to an internal derangement of the knee. This means something is wrong inside the knee. Some of the causes of fluid in the knee may be torn cartilage, a torn ligament, or bleeding into the joint from an injury. Your knee is likely more difficult to bend and move. This is often because there is increased pain and pressure in the joint. The time it takes for recovery from a knee effusion depends on different factors, including:   Type of injury.  Your age.  Physical and medical conditions.  Rehabilitation Strategies. How long you will be away from your normal activities will depend on what kind of knee problem you have and how much damage is present. Your knee has two types of cartilage. Articular cartilage covers the bone ends and lets your knee bend and move smoothly. Two menisci, thick pads of cartilage that form a rim inside the joint, help absorb shock and stabilize your knee. Ligaments bind the bones together and support your knee joint. Muscles move the joint, help support your knee, and take stress off the joint itself. CAUSES  Often an effusion in the knee is caused by an injury to one of the menisci. This is often a tear in the cartilage. Recovery after a meniscus injury depends on how much meniscus is damaged and whether you have damaged other knee tissue. Small tears may heal on their own with conservative treatment. Conservative means rest, limited weight bearing activity and muscle strengthening exercises. Your recovery may take up to 6 weeks.  TREATMENT  Larger  tears may require surgery. Meniscus injuries may be treated during arthroscopy. Arthroscopy is a procedure in which your surgeon uses a small telescope like instrument to look in your knee. Your caregiver can make a more accurate diagnosis (learning what is wrong) by performing an arthroscopic procedure. If your injury is on the inner margin of the meniscus, your surgeon may trim the meniscus back to a smooth rim. In other cases your surgeon will try to repair a damaged meniscus with stitches (sutures). This may make rehabilitation take longer, but may provide better long term result by helping your knee keep its shock absorption capabilities. Ligaments which are completely torn usually require surgery for repair. HOME CARE INSTRUCTIONS  Use crutches as instructed.  If a brace is applied, use as directed.  Once you are home, an ice pack applied to your swollen knee may help with discomfort and help decrease swelling.  Keep your knee raised (elevated) when you are not up and around or on crutches.  Only take over-the-counter or prescription medicines for pain, discomfort, or fever as directed by your caregiver.  Your caregivers will help with instructions for rehabilitation of your knee. This often includes strengthening exercises.  You may resume a normal diet and activities as directed. SEEK MEDICAL CARE IF:   There is increased swelling in your knee.  You notice redness, swelling, or increasing pain in your knee.  An unexplained oral temperature above 102 F (38.9 C) develops. [place]  IF:   You develop a rash.  You have difficulty breathing.  You have any allergic reactions from medications you may have been given.  There is severe pain with any motion of the knee. MAKE SURE YOU:   Understand these instructions.  Will watch your condition.  Will get help right away if you are not doing well or get worse. Document Released: 01/21/2004 Document Revised:  01/23/2012 Document Reviewed: 03/26/2008 [facility] Patient Information 2013 [place].
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
## 2013-03-30 NOTE — ED Provider Notes (Signed)
Medical screening examination/treatment/procedure(s) were performed by non-physician practitioner and as supervising physician I was immediately available for consultation/collaboration.   Ezequiel Essex, MD 03/30/13 [phone]

## 2013-05-23 ENCOUNTER — Other Ambulatory Visit: Payer: Self-pay

## 2013-09-19 ENCOUNTER — Other Ambulatory Visit: Payer: Self-pay

## 2013-09-24 ENCOUNTER — Encounter (HOSPITAL_COMMUNITY): Payer: Self-pay | Admitting: Emergency Medicine

## 2013-09-24 ENCOUNTER — Emergency Department (HOSPITAL_COMMUNITY)
Admission: EM | Admit: 2013-09-24 | Discharge: 2013-09-24 | Disposition: A | Discharge status: Home / Self Care | Payer: Medicaid Other | Attending: Emergency Medicine | Admitting: Emergency Medicine

## 2013-09-24 DIAGNOSIS — S0993XA Unspecified injury of face, initial encounter: Secondary | ICD-10-CM | POA: Insufficient documentation

## 2013-09-24 DIAGNOSIS — S29019A Strain of muscle and tendon of unspecified wall of thorax, initial encounter: Secondary | ICD-10-CM

## 2013-09-24 DIAGNOSIS — R6884 Jaw pain: Secondary | ICD-10-CM | POA: Insufficient documentation

## 2013-09-24 DIAGNOSIS — Z794 Long term (current) use of insulin: Secondary | ICD-10-CM | POA: Insufficient documentation

## 2013-09-24 DIAGNOSIS — Y92410 Unspecified street and highway as the place of occurrence of the external cause: Secondary | ICD-10-CM | POA: Insufficient documentation

## 2013-09-24 DIAGNOSIS — Y9389 Activity, other specified: Secondary | ICD-10-CM | POA: Insufficient documentation

## 2013-09-24 DIAGNOSIS — J45909 Unspecified asthma, uncomplicated: Secondary | ICD-10-CM | POA: Insufficient documentation

## 2013-09-24 DIAGNOSIS — Z79899 Other long term (current) drug therapy: Secondary | ICD-10-CM | POA: Insufficient documentation

## 2013-09-24 DIAGNOSIS — S298XXA Other specified injuries of thorax, initial encounter: Secondary | ICD-10-CM | POA: Insufficient documentation

## 2013-09-24 DIAGNOSIS — E119 Type 2 diabetes mellitus without complications: Secondary | ICD-10-CM | POA: Insufficient documentation

## 2013-09-24 DIAGNOSIS — S161XXA Strain of muscle, fascia and tendon at neck level, initial encounter: Secondary | ICD-10-CM

## 2013-09-24 DIAGNOSIS — M62830 Muscle spasm of back: Secondary | ICD-10-CM

## 2013-09-24 DIAGNOSIS — S239XXA Sprain of unspecified parts of thorax, initial encounter: Secondary | ICD-10-CM | POA: Insufficient documentation

## 2013-09-24 DIAGNOSIS — Z87891 Personal history of nicotine dependence: Secondary | ICD-10-CM | POA: Insufficient documentation

## 2013-09-24 DIAGNOSIS — S139XXA Sprain of joints and ligaments of unspecified parts of neck, initial encounter: Secondary | ICD-10-CM | POA: Insufficient documentation

## 2013-09-24 DIAGNOSIS — R110 Nausea: Secondary | ICD-10-CM | POA: Insufficient documentation

## 2013-09-24 MED ORDER — METHOCARBAMOL 750 MG PO TABS: 750.0000 mg | ORAL_TABLET | Freq: Four times a day (QID) | ORAL | Status: DC | PRN

## 2013-09-24 MED ORDER — HYDROCODONE-ACETAMINOPHEN 5-325 MG PO TABS: 1.0000 | ORAL_TABLET | Freq: Four times a day (QID) | ORAL | Status: DC | PRN

## 2013-09-24 MED ORDER — ONDANSETRON 4 MG PO TBDP
8.0000 mg | ORAL_TABLET | Freq: Once | ORAL | Status: AC
  Administered 2013-09-24: 8 mg via ORAL
  Filled 2013-09-24: qty 2

## 2013-09-24 MED ORDER — METHOCARBAMOL 500 MG PO TABS
750.0000 mg | ORAL_TABLET | Freq: Once | ORAL | Status: AC
  Administered 2013-09-24: 750 mg via ORAL
  Filled 2013-09-24: qty 2

## 2013-09-24 MED ORDER — OXYCODONE-ACETAMINOPHEN 5-325 MG PO TABS
2.0000 | ORAL_TABLET | Freq: Once | ORAL | Status: AC
  Administered 2013-09-24: 2 via ORAL
  Filled 2013-09-24: qty 2

## 2013-09-24 MED ORDER — IBUPROFEN 800 MG PO TABS: 800.0000 mg | ORAL_TABLET | Freq: Three times a day (TID) | ORAL | Status: DC | PRN
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
## 2013-09-26 ENCOUNTER — Emergency Department (HOSPITAL_COMMUNITY): Payer: Medicaid Other

## 2013-09-26 ENCOUNTER — Emergency Department (HOSPITAL_COMMUNITY)
Admission: EM | Admit: 2013-09-26 | Discharge: 2013-09-27 | Disposition: A | Discharge status: Home / Self Care | Payer: Medicaid Other | Attending: Emergency Medicine | Admitting: Emergency Medicine

## 2013-09-26 ENCOUNTER — Encounter (HOSPITAL_COMMUNITY): Payer: Self-pay | Admitting: Emergency Medicine

## 2013-09-26 DIAGNOSIS — Z87891 Personal history of nicotine dependence: Secondary | ICD-10-CM | POA: Insufficient documentation

## 2013-09-26 DIAGNOSIS — Z79899 Other long term (current) drug therapy: Secondary | ICD-10-CM | POA: Insufficient documentation

## 2013-09-26 DIAGNOSIS — E119 Type 2 diabetes mellitus without complications: Secondary | ICD-10-CM | POA: Insufficient documentation

## 2013-09-26 DIAGNOSIS — Z794 Long term (current) use of insulin: Secondary | ICD-10-CM | POA: Insufficient documentation

## 2013-09-26 DIAGNOSIS — G8911 Acute pain due to trauma: Secondary | ICD-10-CM | POA: Insufficient documentation

## 2013-09-26 DIAGNOSIS — M542 Cervicalgia: Secondary | ICD-10-CM

## 2013-09-26 DIAGNOSIS — J45909 Unspecified asthma, uncomplicated: Secondary | ICD-10-CM | POA: Insufficient documentation

## 2013-09-26 LAB — CBC WITH DIFFERENTIAL/PLATELET
Basophils Relative: 0 % (ref 0–1)
Eosinophils Absolute: 0.2 10*3/uL (ref 0.0–0.7)
Eosinophils Relative: 3 % (ref 0–5)
HCT: 35.8 % — ABNORMAL LOW (ref 36.0–46.0)
Hemoglobin: 11.7 g/dL — ABNORMAL LOW (ref 12.0–15.0)
Lymphocytes Relative: 38 % (ref 12–46)
MCH: 24.2 pg — ABNORMAL LOW (ref 26.0–34.0)
MCHC: 32.7 g/dL (ref 30.0–36.0)
MCV: 74 fL — ABNORMAL LOW (ref 78.0–100.0)
Monocytes Absolute: 0.6 10*3/uL (ref 0.1–1.0)
Monocytes Relative: 7 % (ref 3–12)
Neutro Abs: 4.3 10*3/uL (ref 1.7–7.7)
Neutrophils Relative %: 52 % (ref 43–77)
Platelets: 306 10*3/uL (ref 150–400)
RBC: 4.84 MIL/uL (ref 3.87–5.11)

## 2013-09-26 LAB — BASIC METABOLIC PANEL
CO2: 25 mEq/L (ref 19–32)
Calcium: 9.1 mg/dL (ref 8.4–10.5)
GFR calc Af Amer: >90 mL/min (ref >90)
GFR calc non Af Amer: 86 mL/min — ABNORMAL LOW (ref >90)
Potassium: 4.3 mEq/L (ref 3.5–5.1)
Sodium: 132 mEq/L — ABNORMAL LOW (ref 135–145)

## 2013-09-26 LAB — GLUCOSE, CAPILLARY: Glucose-Capillary: 536 mg/dL — ABNORMAL HIGH (ref 70–99)

## 2013-09-26 MED ORDER — SODIUM CHLORIDE 0.9 % IV BOLUS (SEPSIS)
1000.0000 mL | Freq: Once | INTRAVENOUS | Status: AC
  Administered 2013-09-26: 1000 mL via INTRAVENOUS

## 2013-09-26 MED ORDER — ONDANSETRON HCL 4 MG/2ML IJ SOLN
4.0000 mg | Freq: Once | INTRAMUSCULAR | Status: AC
  Administered 2013-09-26: 4 mg via INTRAVENOUS
  Filled 2013-09-26: qty 2

## 2013-09-26 MED ORDER — INSULIN ASPART PROT & ASPART (70-30 MIX) 100 UNIT/ML ~~LOC~~ SUSP
25.0000 [IU] | SUBCUTANEOUS | Status: AC
  Administered 2013-09-26: 25 [IU] via SUBCUTANEOUS
  Filled 2013-09-26: qty 10

## 2013-09-26 MED ORDER — FENTANYL CITRATE 0.05 MG/ML IJ SOLN
50.0000 ug | Freq: Once | INTRAMUSCULAR | Status: AC
  Administered 2013-09-26: 23:00:00 via INTRAVENOUS
  Filled 2013-09-26: qty 2

## 2013-09-26 NOTE — ED Notes (Signed)
Family at bedside. 
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
## 2013-09-26 NOTE — ED Notes (Signed)
Patient transported to CT 
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
## 2013-09-26 NOTE — ED Provider Notes (Signed)
Medical screening examination/treatment/procedure(s) were performed by non-physician practitioner and as supervising physician I was immediately available for consultation/collaboration.  EKG Interpretation   None         Mariea Clonts, MD 09/26/13 [phone]

## 2013-09-26 NOTE — ED Notes (Signed)
Returned from CT scan.

## 2013-09-26 NOTE — ED Notes (Signed)
Pt ambulated to restroom unassisted with steady gait
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
## 2013-09-27 MED ORDER — ONDANSETRON 4 MG PO TBDP: 4.0000 mg | ORAL_TABLET | Freq: Three times a day (TID) | ORAL | Status: DC | PRN

## 2013-09-27 MED ORDER — TRAMADOL HCL 50 MG PO TABS: 50.0000 mg | ORAL_TABLET | Freq: Four times a day (QID) | ORAL | Status: DC | PRN

## 2013-09-29 NOTE — ED Provider Notes (Signed)
Medical screening examination/treatment/procedure(s) were performed by non-physician practitioner and as supervising physician I was immediately available for consultation/collaboration.  EKG Interpretation   None         Zackery Brine B. Karle Starch, MD 09/29/13 [phone]

## 2013-10-01 ENCOUNTER — Encounter (HOSPITAL_COMMUNITY): Payer: Self-pay | Admitting: Emergency Medicine

## 2013-10-01 ENCOUNTER — Emergency Department (HOSPITAL_COMMUNITY)
Admission: EM | Admit: 2013-10-01 | Discharge: 2013-10-01 | Disposition: A | Discharge status: Home / Self Care | Payer: Medicaid Other | Attending: Emergency Medicine | Admitting: Emergency Medicine

## 2013-10-01 DIAGNOSIS — E119 Type 2 diabetes mellitus without complications: Secondary | ICD-10-CM | POA: Insufficient documentation

## 2013-10-01 DIAGNOSIS — Z87891 Personal history of nicotine dependence: Secondary | ICD-10-CM | POA: Insufficient documentation

## 2013-10-01 DIAGNOSIS — G8911 Acute pain due to trauma: Secondary | ICD-10-CM | POA: Insufficient documentation

## 2013-10-01 DIAGNOSIS — M542 Cervicalgia: Secondary | ICD-10-CM

## 2013-10-01 DIAGNOSIS — J45909 Unspecified asthma, uncomplicated: Secondary | ICD-10-CM | POA: Insufficient documentation

## 2013-10-01 DIAGNOSIS — M549 Dorsalgia, unspecified: Secondary | ICD-10-CM | POA: Insufficient documentation

## 2013-10-01 DIAGNOSIS — Z79899 Other long term (current) drug therapy: Secondary | ICD-10-CM | POA: Insufficient documentation

## 2013-10-01 DIAGNOSIS — Z794 Long term (current) use of insulin: Secondary | ICD-10-CM | POA: Insufficient documentation

## 2013-10-01 MED ORDER — ONDANSETRON 4 MG PO TBDP: 4.0000 mg | ORAL_TABLET | Freq: Three times a day (TID) | ORAL | Status: DC | PRN
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
## 2013-10-01 NOTE — ED Provider Notes (Signed)
Medical screening examination/treatment/procedure(s) were performed by non-physician practitioner and as supervising physician I was immediately available for consultation/collaboration.  Leota Jacobsen, MD 10/01/13 [phone]
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
## 2013-10-01 NOTE — ED Provider Notes (Signed)
Medical screening examination/treatment/procedure(s) were conducted as a shared visit with non-physician practitioner(s) and myself.  I personally evaluated the patient during the encounter.  EKG Interpretation   None      Patient seen and examined. No evidence of central cord syndrome. She has normal strength in upper extremities. She will followup with orthopedics  Leota Jacobsen, MD 10/01/13 2043

## 2013-10-01 NOTE — ED Notes (Signed)
Pt needs to refill her prescription of zofran as well
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
## 2013-10-20 ENCOUNTER — Emergency Department (HOSPITAL_COMMUNITY)
Admission: EM | Admit: 2013-10-20 | Discharge: 2013-10-20 | Disposition: A | Discharge status: Home / Self Care | Payer: Medicaid Other | Attending: Emergency Medicine | Admitting: Emergency Medicine

## 2013-10-20 ENCOUNTER — Emergency Department (HOSPITAL_COMMUNITY): Payer: Medicaid Other

## 2013-10-20 ENCOUNTER — Encounter (HOSPITAL_COMMUNITY): Payer: Self-pay | Admitting: Emergency Medicine

## 2013-10-20 DIAGNOSIS — M25579 Pain in unspecified ankle and joints of unspecified foot: Secondary | ICD-10-CM | POA: Insufficient documentation

## 2013-10-20 DIAGNOSIS — E119 Type 2 diabetes mellitus without complications: Secondary | ICD-10-CM | POA: Insufficient documentation

## 2013-10-20 DIAGNOSIS — M79672 Pain in left foot: Secondary | ICD-10-CM

## 2013-10-20 DIAGNOSIS — Z9109 Other allergy status, other than to drugs and biological substances: Secondary | ICD-10-CM | POA: Insufficient documentation

## 2013-10-20 DIAGNOSIS — J45909 Unspecified asthma, uncomplicated: Secondary | ICD-10-CM | POA: Insufficient documentation

## 2013-10-20 DIAGNOSIS — Z79899 Other long term (current) drug therapy: Secondary | ICD-10-CM | POA: Insufficient documentation

## 2013-10-20 DIAGNOSIS — G8911 Acute pain due to trauma: Secondary | ICD-10-CM | POA: Insufficient documentation

## 2013-10-20 DIAGNOSIS — Z87891 Personal history of nicotine dependence: Secondary | ICD-10-CM | POA: Insufficient documentation

## 2013-10-20 DIAGNOSIS — Z794 Long term (current) use of insulin: Secondary | ICD-10-CM | POA: Insufficient documentation

## 2013-10-20 DIAGNOSIS — Z792 Long term (current) use of antibiotics: Secondary | ICD-10-CM | POA: Insufficient documentation

## 2013-10-20 LAB — GLUCOSE, CAPILLARY: Glucose-Capillary: 221 mg/dL — ABNORMAL HIGH (ref 70–99)

## 2013-10-20 MED ORDER — SULFAMETHOXAZOLE-TRIMETHOPRIM 800-160 MG PO TABS: 1.0000 | ORAL_TABLET | Freq: Two times a day (BID) | ORAL | Status: DC

## 2013-10-20 MED ORDER — HYDROCODONE-ACETAMINOPHEN 5-325 MG PO TABS: 1.0000 | ORAL_TABLET | ORAL | Status: DC | PRN
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
## 2013-10-20 NOTE — ED Notes (Signed)
[name] Heck, MD and Carlota Raspberry, PA at bedside.

## 2013-10-20 NOTE — ED Provider Notes (Signed)
Pt with ongoing swelling and drainage from the heel of the L foot - on exam she has a serous drainage from a pinpoint area in the middle of the left heel, no purulence, there is surrounding tenderness of the entire foot with mild warmth and swelling. There is no foul-smelling or odor. Her right foot appears totally normal without swelling.  The patient did have a sprain injury, she has been seen by an orthopedist, she will need an MRI in the morning to evaluate for deep tissue infection or other source of pain and ongoing swelling followed by followup with the orthopedist. This will be ordered by Ms. Green, patient stable for discharge.  Medical screening examination/treatment/procedure(s) were conducted as a shared visit with non-physician practitioner(s) and myself.  I personally evaluated the patient during the encounter.        Johnna Acosta, MD 10/22/13 [phone]

## 2013-10-21 ENCOUNTER — Telehealth (HOSPITAL_BASED_OUTPATIENT_CLINIC_OR_DEPARTMENT_OTHER): Payer: Self-pay

## 2013-10-21 NOTE — Telephone Encounter (Signed)
Kaitlyn PA in ED working with MRI to schedule MRI of left foot.

## 2013-10-22 ENCOUNTER — Encounter: Payer: Self-pay | Admitting: Internal Medicine

## 2013-10-22 ENCOUNTER — Ambulatory Visit (HOSPITAL_COMMUNITY)
Admission: RE | Admit: 2013-10-22 | Discharge: 2013-10-22 | Disposition: A | Discharge status: Home / Self Care | Payer: Medicaid Other | Source: Ambulatory Visit | Attending: Internal Medicine | Admitting: Internal Medicine

## 2013-10-22 ENCOUNTER — Ambulatory Visit (INDEPENDENT_AMBULATORY_CARE_PROVIDER_SITE_OTHER): Payer: Medicaid Other | Admitting: Internal Medicine

## 2013-10-22 VITALS — BP 123/74 | HR 91 | Temp 98.0°F | Wt 223.5 lb

## 2013-10-22 DIAGNOSIS — X58XXXA Exposure to other specified factors, initial encounter: Secondary | ICD-10-CM | POA: Insufficient documentation

## 2013-10-22 DIAGNOSIS — S93409A Sprain of unspecified ligament of unspecified ankle, initial encounter: Secondary | ICD-10-CM | POA: Insufficient documentation

## 2013-10-22 DIAGNOSIS — R609 Edema, unspecified: Secondary | ICD-10-CM

## 2013-10-22 DIAGNOSIS — E119 Type 2 diabetes mellitus without complications: Secondary | ICD-10-CM

## 2013-10-22 DIAGNOSIS — M79609 Pain in unspecified limb: Secondary | ICD-10-CM

## 2013-10-22 DIAGNOSIS — D509 Iron deficiency anemia, unspecified: Secondary | ICD-10-CM

## 2013-10-22 DIAGNOSIS — R600 Localized edema: Secondary | ICD-10-CM

## 2013-10-22 DIAGNOSIS — D649 Anemia, unspecified: Secondary | ICD-10-CM

## 2013-10-22 DIAGNOSIS — IMO0002 Reserved for concepts with insufficient information to code with codable children: Secondary | ICD-10-CM

## 2013-10-22 DIAGNOSIS — E785 Hyperlipidemia, unspecified: Secondary | ICD-10-CM

## 2013-10-22 DIAGNOSIS — S93402A Sprain of unspecified ligament of left ankle, initial encounter: Secondary | ICD-10-CM

## 2013-10-22 DIAGNOSIS — M25579 Pain in unspecified ankle and joints of unspecified foot: Secondary | ICD-10-CM | POA: Insufficient documentation

## 2013-10-22 DIAGNOSIS — Z0000 Encounter for general adult medical examination without abnormal findings: Secondary | ICD-10-CM

## 2013-10-22 DIAGNOSIS — M7989 Other specified soft tissue disorders: Secondary | ICD-10-CM

## 2013-10-22 DIAGNOSIS — E1142 Type 2 diabetes mellitus with diabetic polyneuropathy: Secondary | ICD-10-CM

## 2013-10-22 DIAGNOSIS — E1149 Type 2 diabetes mellitus with other diabetic neurological complication: Secondary | ICD-10-CM

## 2013-10-22 DIAGNOSIS — E1140 Type 2 diabetes mellitus with diabetic neuropathy, unspecified: Secondary | ICD-10-CM

## 2013-10-22 LAB — CBC WITH DIFFERENTIAL/PLATELET
Basophils Absolute: 0 10*3/uL (ref 0.0–0.1)
Eosinophils Absolute: 0.2 10*3/uL (ref 0.0–0.7)
Eosinophils Relative: 3 % (ref 0–5)
Lymphocytes Relative: 21 % (ref 12–46)
MCV: 73.3 fL — ABNORMAL LOW (ref 78.0–100.0)
Monocytes Absolute: 0.6 10*3/uL (ref 0.1–1.0)
Platelets: 363 10*3/uL (ref 150–400)
RBC: 4.3 MIL/uL (ref 3.87–5.11)
RDW: 16 % — ABNORMAL HIGH (ref 11.5–15.5)
WBC: 7.1 10*3/uL (ref 4.0–10.5)

## 2013-10-22 LAB — LIPID PANEL
Cholesterol: 214 mg/dL — ABNORMAL HIGH (ref 0–200)
LDL Cholesterol: 140 mg/dL — ABNORMAL HIGH (ref 0–99)
VLDL: 22 mg/dL (ref 0–40)

## 2013-10-22 LAB — COMPLETE METABOLIC PANEL WITH GFR
AST: 14 U/L (ref 0–37)
Albumin: 3.3 g/dL — ABNORMAL LOW (ref 3.5–5.2)
BUN: 13 mg/dL (ref 6–23)
CO2: 31 mEq/L (ref 19–32)
Calcium: 9 mg/dL (ref 8.4–10.5)
Chloride: 103 mEq/L (ref 96–112)
GFR, Est African American: 89 mL/min
Glucose, Bld: 89 mg/dL (ref 70–99)
Potassium: 4 mEq/L (ref 3.5–5.3)
Total Bilirubin: 0.2 mg/dL — ABNORMAL LOW (ref 0.3–1.2)

## 2013-10-22 LAB — POCT GLYCOSYLATED HEMOGLOBIN (HGB A1C): Hemoglobin A1C: >14

## 2013-10-22 MED ORDER — ONETOUCH ULTRA SYSTEM W/DEVICE KIT: 1.0000 | PACK | Freq: Once | Status: DC

## 2013-10-22 MED ORDER — ACCU-CHEK AVIVA PLUS W/DEVICE KIT: PACK | Status: DC

## 2013-10-22 NOTE — ED Provider Notes (Signed)
Pt with ongoing swelling and drainage from the heel of the L foot - on exam she has a serous drainage from a pinpoint area in the middle of the left heel, no purulence, there is surrounding tenderness of the entire foot with mild warmth and swelling. There is no foul-smelling or odor. Her right foot appears totally normal without swelling.   The patient did have a sprain injury, she has been seen by an orthopedist, she will need an MRI in the morning to evaluate for deep tissue infection or other source of pain and ongoing swelling followed by followup with the orthopedist. This will be ordered by Ms. Green, patient stable for discharge.   Medical screening examination/treatment/procedure(s) were conducted as a shared visit with non-physician practitioner(s) and myself. I personally evaluated the patient during the encounter.   Johnna Acosta, MD 10/22/13 [phone]

## 2013-10-22 NOTE — Progress Notes (Signed)
VASCULAR LAB PRELIMINARY  PRELIMINARY  PRELIMINARY  PRELIMINARY  Left lower extremity venous duplex completed.    Preliminary report:  Left:  No evidence of DVT, superficial thrombosis, or Baker's cyst.  Lexianna Weinrich, RVT 10/22/2013, 2:54 PM

## 2013-10-22 NOTE — Patient Instructions (Addendum)
-  Please start checking your glucose at least 3 times daily and bring your new meter to  next visit  -Will check your blood work today  -Will obtain ultrasound of your left leg and call you with the results -Will obtain eye exam on next visit  -Will see you back in 1 week

## 2013-10-23 LAB — ANEMIA PANEL
%SAT: 6 % — ABNORMAL LOW (ref 20–55)
Ferritin: 16 ng/mL (ref 10–291)
Folate: 14.8 ng/mL
RBC.: 4.3 MIL/uL (ref 3.87–5.11)
UIBC: 336 ug/dL (ref 125–400)
Vitamin B-12: 550 pg/mL (ref 211–911)

## 2013-10-24 ENCOUNTER — Encounter (HOSPITAL_COMMUNITY): Payer: Self-pay

## 2013-10-24 ENCOUNTER — Ambulatory Visit (HOSPITAL_COMMUNITY)
Admission: RE | Admit: 2013-10-24 | Discharge: 2013-10-24 | Disposition: A | Discharge status: Home / Self Care | Payer: Medicaid Other | Source: Ambulatory Visit | Attending: Emergency Medicine | Admitting: Emergency Medicine

## 2013-10-24 DIAGNOSIS — L97409 Non-pressure chronic ulcer of unspecified heel and midfoot with unspecified severity: Secondary | ICD-10-CM | POA: Insufficient documentation

## 2013-10-24 DIAGNOSIS — E119 Type 2 diabetes mellitus without complications: Secondary | ICD-10-CM | POA: Insufficient documentation

## 2013-10-24 MED ORDER — GADOBENATE DIMEGLUMINE 529 MG/ML IV SOLN
20.0000 mL | Freq: Once | INTRAVENOUS | Status: AC | PRN
  Administered 2013-10-24: 20 mL via INTRAVENOUS

## 2013-10-25 DIAGNOSIS — Z01810 Encounter for preprocedural cardiovascular examination: Secondary | ICD-10-CM | POA: Insufficient documentation

## 2013-10-25 DIAGNOSIS — D509 Iron deficiency anemia, unspecified: Secondary | ICD-10-CM | POA: Insufficient documentation

## 2013-10-25 NOTE — Assessment & Plan Note (Signed)
Pt declined annual influenza vaccination on 10/22/13

## 2013-10-25 NOTE — Assessment & Plan Note (Addendum)
Assessment: Pt with uncontrolled Type II DM with last HbA1c of 17 on 09/26/12  and proteinuria on 11/04/11 who is not compliant with insulin regimen with no recent hypoglycemic events who presents with evidence of neuropathy and blood glucose level of 178.    Plan: -HbA1c >14 not at goal of <7.0 -Pt instructed to use glucose meter and check BS x3 daily  -Continue Novolog 70/30 25-30U --> consider adjustment at next visit (pt to bring log)   -BP 123/74 at goal <140/80 -Obtain annual lipid panel --> LDL 140 not at goal <70-100, pt was previously on simvastatin 40 mg daily, will resume at next visit  -Obtain urinary protein/Cr at next visit in 1 week (last one on 11/04/11 with proteinuria)--> will start low-dose lisinopril (2.5 mg) at next visit  -Obtain CMP to assess renal function--> Cr WNL, GFR 89 suggesting CKD Stage 2 -Annual foot exam today 10/22/13--> evidence of neuropathy --> consider gabapentin or pre-gabalin  -Pt to receive retinal scanner at next visit in 1 week to assess for retinopathy  -BMI 38 not at goal <25--> encourage weight loss -Consider 81 mg daily aspirin for primary CVD prevention

## 2013-10-25 NOTE — Assessment & Plan Note (Signed)
Assessment:  Pt  with microcytic anemia since 2010 with last Hg of 11.7 on 09/26/13 who reports cravings for non-food material (dirt) who is asymptomatic and hemodynamically stable with no active bleeding most likely due to menorrhagia (no FH colon cancer or GI bleeding) and who has not been on oral iron therapy in the past.    Plan: -Obtain CBC --> MCV 73.3 H/H 9.9/31.5 -Obtain anemia panel--> ferritin 16(low-normal), iron 21(low), iron sat 67 (low), TIBC 357 (high-normal), RDW 16 (high)---> consistent with iron-deficiency anemia -Will start oral iron therapy at next visit

## 2013-10-25 NOTE — Assessment & Plan Note (Addendum)
Assessment: Pt with hypercholesteremia  (total and LDL) on lipid panel on 09/26/12 who was previously on statin therapy last year but not currently.   Plan:  -Obtain annual lipid panel --> LDL 140 not at goal <70-100 -Obtain CMP to assess liver function --> WNL -Resume simvastatin 40 mg daily at next visit
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
## 2013-10-28 ENCOUNTER — Encounter: Payer: Self-pay | Admitting: Dietician

## 2013-10-28 ENCOUNTER — Ambulatory Visit (INDEPENDENT_AMBULATORY_CARE_PROVIDER_SITE_OTHER): Payer: Medicaid Other | Admitting: Internal Medicine

## 2013-10-28 VITALS — BP 129/81 | HR 92 | Temp 98.5°F | Wt 226.9 lb

## 2013-10-28 DIAGNOSIS — Z0000 Encounter for general adult medical examination without abnormal findings: Secondary | ICD-10-CM

## 2013-10-28 DIAGNOSIS — S93402A Sprain of unspecified ligament of left ankle, initial encounter: Secondary | ICD-10-CM

## 2013-10-28 DIAGNOSIS — D509 Iron deficiency anemia, unspecified: Secondary | ICD-10-CM

## 2013-10-28 DIAGNOSIS — E785 Hyperlipidemia, unspecified: Secondary | ICD-10-CM

## 2013-10-28 DIAGNOSIS — E1149 Type 2 diabetes mellitus with other diabetic neurological complication: Secondary | ICD-10-CM

## 2013-10-28 DIAGNOSIS — E1140 Type 2 diabetes mellitus with diabetic neuropathy, unspecified: Secondary | ICD-10-CM

## 2013-10-28 DIAGNOSIS — S93409A Sprain of unspecified ligament of unspecified ankle, initial encounter: Secondary | ICD-10-CM

## 2013-10-28 DIAGNOSIS — Z23 Encounter for immunization: Secondary | ICD-10-CM

## 2013-10-28 DIAGNOSIS — E1142 Type 2 diabetes mellitus with diabetic polyneuropathy: Secondary | ICD-10-CM

## 2013-10-28 DIAGNOSIS — E119 Type 2 diabetes mellitus without complications: Secondary | ICD-10-CM

## 2013-10-28 LAB — HM DIABETES EYE EXAM

## 2013-10-28 LAB — GLUCOSE, CAPILLARY: Glucose-Capillary: 119 mg/dL — ABNORMAL HIGH (ref 70–99)

## 2013-10-28 MED ORDER — LISINOPRIL 2.5 MG PO TABS: 2.5000 mg | ORAL_TABLET | Freq: Every day | ORAL | Status: DC

## 2013-10-28 MED ORDER — FERROUS SULFATE 325 (65 FE) MG PO TABS: 325.0000 mg | ORAL_TABLET | Freq: Three times a day (TID) | ORAL | Status: DC

## 2013-10-28 MED ORDER — GABAPENTIN 300 MG PO CAPS: 300.0000 mg | ORAL_CAPSULE | Freq: Three times a day (TID) | ORAL | Status: DC

## 2013-10-28 MED ORDER — SIMVASTATIN 40 MG PO TABS: 40.0000 mg | ORAL_TABLET | Freq: Every day | ORAL | Status: DC

## 2013-10-28 NOTE — Patient Instructions (Addendum)
-  Keep using crutches and dressing your wound -Take OTC tylenol for pain, and please follow-up with Dr. Lorin Mercy on 12/17 -Start taking lisinopril, simvastatin, and gabapentin daily -Take iron three times a day with meals and use stool softener if needed, it may make your stool dark -Please start checking your sugars at least x3 daily and bring in meter next time -Keep using insulin  -Will check your eyes today -Will give you tetanus shot today -Will refer you to OBGYN for Pap smear and evaluate your heavy periods -Will see you back in 1 month

## 2013-10-29 NOTE — Assessment & Plan Note (Addendum)
Assessment: Pt with history of anemia since teenage years with evidence of iron-deficiency anemia not on iron therapy in the past and not requiring blood transfusions with last Hg of 9.9 who is hemodynamically stable with no active bleeding with etiology most likely due to menorrhagia (with no past h/o of fibroids) who is symptomatic with PICA disorder, fatigue, coldness, and dyspnea with exertion.     Plan: -Start ferrous sulfate 325 mg TID with meals (stool softner PRN for constipation)  -Continue to monitor CBC -Refer to OBGYN for further evaluation of etiology of menorrhagia (last abd [place] on 11/28/10 WNL)

## 2013-10-29 NOTE — Assessment & Plan Note (Addendum)
Assessment: Pt with uncontrolled insulin-dependent Type II DM with last HbA1c of 99991111 on 123XX123 complicated by nephropathy and neuropathy who is non-compliant with insulin regimen and no recent hypoglycemic events who presents with CBG of of 119.    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

## 2013-10-29 NOTE — Assessment & Plan Note (Signed)
Assessment: Pt with worsening hypercholesteremia (total and LDL) on lipid panel on 10/22/13 with improved triglyceride levels from last year with normal liver function and no complaints of myalgias.   Plan:  -Total cholesterol 214 not at goal <200 -LDL cholesterol not at goal <70-100 -Start simvastatin 40 mg daily  -Continue to monitor CMP  -Continue to monitor for adverse effects

## 2013-10-29 NOTE — Assessment & Plan Note (Addendum)
-  Pt received Tdap vaccination on 10/28/13 -Pt to receive pap smear once seen by OBGYN
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
## 2013-10-31 NOTE — Progress Notes (Signed)
I saw and evaluated the patient.  I personally confirmed the key portions of the history and exam documented by Dr. Wilson and I reviewed pertinent patient test results.  The assessment, diagnosis, and plan were formulated together and I agree with the documentation in the resident's note. 

## 2013-11-01 NOTE — Progress Notes (Signed)
I saw and evaluated the patient.  I personally confirmed the key portions of Dr. Rabbani's history and exam and reviewed pertinent patient test results.  The assessment, diagnosis, and plan were formulated together and I agree with the documentation in the resident's note. 

## 2013-11-28 NOTE — Addendum Note (Signed)
Addended by: Truddie Crumble on: 11/28/2013 11:22 AM   Modules accepted: Orders

## 2013-12-12 ENCOUNTER — Encounter: Payer: Medicaid Other | Admitting: Obstetrics & Gynecology

## 2013-12-18 NOTE — Addendum Note (Signed)
Addended by: Hulan Fray on: 12/18/2013 05:38 PM   Modules accepted: Orders

## 2013-12-24 ENCOUNTER — Emergency Department (HOSPITAL_COMMUNITY)
Admission: EM | Admit: 2013-12-24 | Discharge: 2013-12-24 | Disposition: A | Discharge status: Home / Self Care | Payer: No Typology Code available for payment source | Attending: Emergency Medicine | Admitting: Emergency Medicine

## 2013-12-24 ENCOUNTER — Emergency Department (HOSPITAL_COMMUNITY): Payer: No Typology Code available for payment source

## 2013-12-24 ENCOUNTER — Encounter (HOSPITAL_COMMUNITY): Payer: Self-pay | Admitting: Emergency Medicine

## 2013-12-24 DIAGNOSIS — J45909 Unspecified asthma, uncomplicated: Secondary | ICD-10-CM | POA: Insufficient documentation

## 2013-12-24 DIAGNOSIS — M25473 Effusion, unspecified ankle: Secondary | ICD-10-CM | POA: Diagnosis not present

## 2013-12-24 DIAGNOSIS — Z87891 Personal history of nicotine dependence: Secondary | ICD-10-CM | POA: Diagnosis not present

## 2013-12-24 DIAGNOSIS — M25476 Effusion, unspecified foot: Secondary | ICD-10-CM | POA: Diagnosis not present

## 2013-12-24 DIAGNOSIS — G8911 Acute pain due to trauma: Secondary | ICD-10-CM | POA: Insufficient documentation

## 2013-12-24 DIAGNOSIS — E119 Type 2 diabetes mellitus without complications: Secondary | ICD-10-CM | POA: Diagnosis not present

## 2013-12-24 DIAGNOSIS — L97409 Non-pressure chronic ulcer of unspecified heel and midfoot with unspecified severity: Secondary | ICD-10-CM | POA: Insufficient documentation

## 2013-12-24 DIAGNOSIS — M25579 Pain in unspecified ankle and joints of unspecified foot: Secondary | ICD-10-CM | POA: Insufficient documentation

## 2013-12-24 DIAGNOSIS — Z79899 Other long term (current) drug therapy: Secondary | ICD-10-CM | POA: Diagnosis not present

## 2013-12-24 DIAGNOSIS — E785 Hyperlipidemia, unspecified: Secondary | ICD-10-CM | POA: Diagnosis not present

## 2013-12-24 DIAGNOSIS — Z794 Long term (current) use of insulin: Secondary | ICD-10-CM | POA: Insufficient documentation

## 2013-12-24 DIAGNOSIS — M79672 Pain in left foot: Secondary | ICD-10-CM

## 2013-12-24 MED ORDER — HYDROCODONE-ACETAMINOPHEN 5-325 MG PO TABS
2.0000 | ORAL_TABLET | Freq: Once | ORAL | Status: AC
  Administered 2013-12-24: 2 via ORAL
  Filled 2013-12-24: qty 2

## 2013-12-24 MED ORDER — SULFAMETHOXAZOLE-TMP DS 800-160 MG PO TABS
1.0000 | ORAL_TABLET | Freq: Once | ORAL | Status: AC
  Administered 2013-12-24: 1 via ORAL
  Filled 2013-12-24: qty 1

## 2013-12-24 MED ORDER — HYDROCODONE-ACETAMINOPHEN 5-325 MG PO TABS: 1.0000 | ORAL_TABLET | ORAL | Status: DC | PRN

## 2013-12-24 MED ORDER — SULFAMETHOXAZOLE-TRIMETHOPRIM 800-160 MG PO TABS: 1.0000 | ORAL_TABLET | Freq: Two times a day (BID) | ORAL | Status: DC

## 2013-12-24 NOTE — Discharge Instructions (Signed)
Ankle Pain  Ankle pain is a common symptom. The bones, cartilage, tendons, and muscles of the ankle joint perform a lot of work each day. The ankle joint holds your body weight and allows you to move around. Ankle pain can occur on either side or back of 1 or both ankles. Ankle pain may be sharp and burning or dull and aching. There may be tenderness, stiffness, redness, or warmth around the ankle. The pain occurs more often when a person walks or puts pressure on the ankle.  CAUSES   There are many reasons ankle pain can develop. It is important to work with your caregiver to identify the cause since many conditions can impact the bones, cartilage, muscles, and tendons. Causes for ankle pain include:  · Injury, including a break (fracture), sprain, or strain often due to a fall, sports, or a high-impact activity.  · Swelling (inflammation) of a tendon (tendonitis).  · Achilles tendon rupture.  · Ankle instability after repeated sprains and strains.  · Poor foot alignment.  · Pressure on a nerve (tarsal tunnel syndrome).  · Arthritis in the ankle or the lining of the ankle.  · Crystal formation in the ankle (gout or pseudogout).  DIAGNOSIS   A diagnosis is based on your medical history, your symptoms, results of your physical exam, and results of diagnostic tests. Diagnostic tests may include X-ray exams or a computerized magnetic scan (magnetic resonance imaging, MRI).  TREATMENT   Treatment will depend on the cause of your ankle pain and may include:  · Keeping pressure off the ankle and limiting activities.  · Using crutches or other walking support (a cane or brace).  · Using rest, ice, compression, and elevation.  · Participating in physical therapy or home exercises.  · Wearing shoe inserts or special shoes.  · Losing weight.  · Taking medications to reduce pain or swelling or receiving an injection.  · Undergoing surgery.  HOME CARE INSTRUCTIONS   · Only take over-the-counter or prescription medicines for  pain, discomfort, or fever as directed by your caregiver.  · Put ice on the injured area.  · Put ice in a plastic bag.  · Place a towel between your skin and the bag.  · Leave the ice on for 15-20 minutes at a time, 03-04 times a day.  · Keep your leg raised (elevated) when possible to lessen swelling.  · Avoid activities that cause ankle pain.  · Follow specific exercises as directed by your caregiver.  · Record how often you have ankle pain, the location of the pain, and what it feels like. This information may be helpful to you and your caregiver.  · Ask your caregiver about returning to work or sports and whether you should drive.  · Follow up with your caregiver for further examination, therapy, or testing as directed.  SEEK MEDICAL CARE IF:   · Pain or swelling continues or worsens beyond 1 week.  · You have an oral temperature above 102° F (38.9° C).  · You are feeling unwell or have chills.  · You are having an increasingly difficult time with walking.  · You have loss of sensation or other new symptoms.  · You have questions or concerns.  MAKE SURE YOU:   · Understand these instructions.  · Will watch your condition.  · Will get help right away if you are not doing well or get worse.  Document Released: 04/20/2010 Document Revised: 01/23/2012 Document Reviewed: 04/20/2010  [employer]®   Patient Information ©2014 [employer].
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
## 2013-12-25 NOTE — ED Provider Notes (Signed)
Medical screening examination/treatment/procedure(s) were performed by non-physician practitioner and as supervising physician I was immediately available for consultation/collaboration.  EKG Interpretation   None         Tanna Furry, MD 12/25/13 1658

## 2014-01-08 NOTE — Addendum Note (Signed)
Addended by: Orson Gear on: 01/08/2014 12:08 PM   Modules accepted: Orders

## 2014-02-08 ENCOUNTER — Encounter (HOSPITAL_COMMUNITY): Payer: Self-pay | Admitting: Emergency Medicine

## 2014-02-08 ENCOUNTER — Observation Stay (HOSPITAL_COMMUNITY)
Admission: EM | Admit: 2014-02-08 | Discharge: 2014-02-09 | Disposition: A | Discharge status: Home / Self Care | Payer: Medicaid Other | Attending: Internal Medicine | Admitting: Internal Medicine

## 2014-02-08 DIAGNOSIS — F17200 Nicotine dependence, unspecified, uncomplicated: Secondary | ICD-10-CM | POA: Insufficient documentation

## 2014-02-08 DIAGNOSIS — E113493 Type 2 diabetes mellitus with severe nonproliferative diabetic retinopathy without macular edema, bilateral: Secondary | ICD-10-CM | POA: Diagnosis present

## 2014-02-08 DIAGNOSIS — D649 Anemia, unspecified: Secondary | ICD-10-CM

## 2014-02-08 DIAGNOSIS — E1149 Type 2 diabetes mellitus with other diabetic neurological complication: Principal | ICD-10-CM | POA: Insufficient documentation

## 2014-02-08 DIAGNOSIS — B373 Candidiasis of vulva and vagina: Secondary | ICD-10-CM | POA: Diagnosis present

## 2014-02-08 DIAGNOSIS — N181 Chronic kidney disease, stage 1: Secondary | ICD-10-CM | POA: Insufficient documentation

## 2014-02-08 DIAGNOSIS — Z91199 Patient's noncompliance with other medical treatment and regimen due to unspecified reason: Secondary | ICD-10-CM | POA: Insufficient documentation

## 2014-02-08 DIAGNOSIS — B3731 Acute candidiasis of vulva and vagina: Secondary | ICD-10-CM | POA: Insufficient documentation

## 2014-02-08 DIAGNOSIS — A5901 Trichomonal vulvovaginitis: Secondary | ICD-10-CM | POA: Insufficient documentation

## 2014-02-08 DIAGNOSIS — Z794 Long term (current) use of insulin: Secondary | ICD-10-CM | POA: Insufficient documentation

## 2014-02-08 DIAGNOSIS — I129 Hypertensive chronic kidney disease with stage 1 through stage 4 chronic kidney disease, or unspecified chronic kidney disease: Secondary | ICD-10-CM | POA: Insufficient documentation

## 2014-02-08 DIAGNOSIS — E1142 Type 2 diabetes mellitus with diabetic polyneuropathy: Secondary | ICD-10-CM | POA: Insufficient documentation

## 2014-02-08 DIAGNOSIS — J45909 Unspecified asthma, uncomplicated: Secondary | ICD-10-CM | POA: Insufficient documentation

## 2014-02-08 DIAGNOSIS — R739 Hyperglycemia, unspecified: Secondary | ICD-10-CM | POA: Diagnosis present

## 2014-02-08 DIAGNOSIS — E785 Hyperlipidemia, unspecified: Secondary | ICD-10-CM | POA: Insufficient documentation

## 2014-02-08 DIAGNOSIS — Z9119 Patient's noncompliance with other medical treatment and regimen: Secondary | ICD-10-CM | POA: Insufficient documentation

## 2014-02-08 DIAGNOSIS — D509 Iron deficiency anemia, unspecified: Secondary | ICD-10-CM | POA: Insufficient documentation

## 2014-02-08 LAB — URINE MICROSCOPIC-ADD ON

## 2014-02-08 LAB — DIFFERENTIAL
BASOS ABS: 0 10*3/uL (ref 0.0–0.1)
Basophils Relative: 0 % (ref 0–1)
Eosinophils Absolute: 0.3 10*3/uL (ref 0.0–0.7)
Eosinophils Relative: 2 % (ref 0–5)
LYMPHS PCT: 19 % (ref 12–46)
Lymphs Abs: 2.2 10*3/uL (ref 0.7–4.0)
MONO ABS: 0.8 10*3/uL (ref 0.1–1.0)
Monocytes Relative: 7 % (ref 3–12)
Neutro Abs: 8 10*3/uL — ABNORMAL HIGH (ref 1.7–7.7)
Neutrophils Relative %: 71 % (ref 43–77)

## 2014-02-08 LAB — URINALYSIS, ROUTINE W REFLEX MICROSCOPIC
BILIRUBIN URINE: NEGATIVE
Glucose, UA: >1000 mg/dL — AB
Ketones, ur: NEGATIVE mg/dL
LEUKOCYTES UA: NEGATIVE
Nitrite: NEGATIVE
PH: 5.5 (ref 5.0–8.0)
Protein, ur: 30 mg/dL — AB
SPECIFIC GRAVITY, URINE: 1.01 (ref 1.005–1.030)
Urobilinogen, UA: 0.2 mg/dL (ref 0.0–1.0)

## 2014-02-08 LAB — CBC
HCT: 32.9 % — ABNORMAL LOW (ref 36.0–46.0)
Hemoglobin: 10.8 g/dL — ABNORMAL LOW (ref 12.0–15.0)
MCH: 25.2 pg — AB (ref 26.0–34.0)
MCHC: 32.8 g/dL (ref 30.0–36.0)
MCV: 76.9 fL — AB (ref 78.0–100.0)
PLATELETS: 296 10*3/uL (ref 150–400)
RBC: 4.28 MIL/uL (ref 3.87–5.11)
RDW: 14.5 % (ref 11.5–15.5)
WBC: 11.4 10*3/uL — AB (ref 4.0–10.5)

## 2014-02-08 LAB — CBG MONITORING, ED

## 2014-02-08 MED ORDER — SODIUM CHLORIDE 0.9 % IV SOLN
1000.0000 mL | Freq: Once | INTRAVENOUS | Status: AC
  Administered 2014-02-09: 1000 mL via INTRAVENOUS

## 2014-02-08 MED ORDER — SODIUM CHLORIDE 0.9 % IV SOLN
1000.0000 mL | INTRAVENOUS | Status: DC
  Administered 2014-02-09: 1000 mL via INTRAVENOUS

## 2014-02-08 MED ORDER — INSULIN ASPART 100 UNIT/ML ~~LOC~~ SOLN
10.0000 [IU] | Freq: Once | SUBCUTANEOUS | Status: AC
  Administered 2014-02-08: 10 [IU] via INTRAVENOUS
  Filled 2014-02-08: qty 1

## 2014-02-08 MED ORDER — SODIUM CHLORIDE 0.9 % IV SOLN
1000.0000 mL | Freq: Once | INTRAVENOUS | Status: AC
  Administered 2014-02-08: 1000 mL via INTRAVENOUS

## 2014-02-08 NOTE — ED Notes (Signed)
Patient states she has not been taking her diabetic med (insulin) for a couple of weeks.  Also states her vaginal area is red and sore.

## 2014-02-08 NOTE — ED Notes (Signed)
The pt is c/o a vaginal irritation that she gets whennher sugar runs high.  She has had this for 3 days.  She is usually given diflucan formit

## 2014-02-08 NOTE — ED Provider Notes (Signed)
CSN: [id number]     Arrival date & time 02/08/14  2150 History   First MD Initiated Contact with Patient 02/08/14 2253     Chief Complaint  Patient presents with  . Diabetes  . Vaginal Pain     (Consider location/radiation/quality/duration/timing/severity/associated sxs/prior Treatment) Patient is a 38 y.o. female presenting with diabetes problem and vaginal pain. The history is provided by the patient.  Diabetes  Vaginal Pain  She states that she was unable to get her prescription for insulin refill about 7 weeks ago so she has been without insulin during that time. She has not checked her blood sugars because she states that her blood sugar meter does not have all the parts. She has noted that she is thirsty but has not noted polyuria or vision change. Her last 3 days, she has noted vaginal irritation. She has treated this with topical Neosporin with no benefit. She's had intermittent nausea and vomiting, but none today. There's been no diarrhea. She denies fever or chills. Of note, she states the reason that she could not get her insulin filled as though she cannot afford to the $3 co-pay. However she does drink alcohol and smoke black and mild cigars occasionally. She has not talked with her PCP regarding her inability to get her insulin prescription filled.  Past Medical History  Diagnosis Date  . Asthma   . Diabetes mellitus   . Hyperlipidemia   . Tobacco abuse   . Seasonal allergies    Past Surgical History  Procedure Laterality Date  . Tubal ligation    . Cesarean section     Family History  Problem Relation Age of Onset  . Stroke Mother   . Diabetes Father   . Diabetes Mother   . Diabetes Maternal Grandmother   . Diabetes Maternal Grandfather   . Hypertension Mother   . Hypertension Father   . Cancer Paternal Grandfather     Prostate  . Aneurysm Mother    History  Substance Use Topics  . Smoking status: Former Smoker -- 0.30 packs/day for 1 years    Types:  Cigarettes    Quit date: 09/02/2010  . Smokeless tobacco: Not on file  . Alcohol Use: Yes   OB History   Grav Para Term Preterm Abortions TAB SAB Ect Mult Living                 Review of Systems  Genitourinary: Positive for vaginal pain.  All other systems reviewed and are negative.      Allergies  Review of patient's allergies indicates no known allergies.  Home Medications  No current outpatient prescriptions on file. BP 120/76  Pulse 102  Temp(Src) 99.2 F (37.3 C) (Oral)  Resp 18  Ht 5\' 5"  (1.651 m)  Wt 220 lb (99.791 kg)  BMI 36.61 kg/m2  SpO2 99%  LMP 01/18/2014 Physical Exam  Nursing note and vitals reviewed.  38 year old female, resting comfortably and in no acute distress. Vital signs are normal. Oxygen saturation is 99%, which is normal. Head is normocephalic and atraumatic. PERRLA, EOMI. Oropharynx is clear. Neck is nontender and supple without adenopathy or JVD. Back is nontender and there is no CVA tenderness. Lungs are clear without rales, wheezes, or rhonchi. Chest is nontender. Heart has regular rate and rhythm without murmur. Abdomen is soft, flat, nontender without masses or hepatosplenomegaly and peristalsis is normoactive. Genitalia: Marked erythema of the vulva, perineum, and inguinal folds consistent with monilial infection. There is some  excoriation of the skin noted. Extremities have no cyanosis or edema, full range of motion is present. Skin is warm and dry without rash. Neurologic: Mental status is normal, cranial nerves are intact, there are no motor or sensory deficits.  ED Course  Procedures (including critical care time) Labs Review Results for orders placed during the hospital encounter of 02/08/14  CBC      Result Value Ref Range   WBC 11.4 (*) 4.0 - 10.5 K/uL   RBC 4.28  3.87 - 5.11 MIL/uL   Hemoglobin 10.8 (*) 12.0 - 15.0 g/dL   HCT 32.9 (*) 36.0 - 46.0 %   MCV 76.9 (*) 78.0 - 100.0 fL   MCH 25.2 (*) 26.0 - 34.0 pg   MCHC  32.8  30.0 - 36.0 g/dL   RDW 14.5  11.5 - 15.5 %   Platelets 296  150 - 400 K/uL  COMPREHENSIVE METABOLIC PANEL      Result Value Ref Range   Sodium 129 (*) 137 - 147 mEq/L   Potassium 4.2  3.7 - 5.3 mEq/L   Chloride 89 (*) 96 - 112 mEq/L   CO2 24  19 - 32 mEq/L   Glucose, Bld 754 (*) 70 - 99 mg/dL   BUN 12  6 - 23 mg/dL   Creatinine, Ser 0.92  0.50 - 1.10 mg/dL   Calcium 9.0  8.4 - 10.5 mg/dL   Total Protein 7.2  6.0 - 8.3 g/dL   Albumin 3.1 (*) 3.5 - 5.2 g/dL   AST 14  0 - 37 U/L   ALT 16  0 - 35 U/L   Alkaline Phosphatase 150 (*) 39 - 117 U/L   Total Bilirubin <0.2 (*) 0.3 - 1.2 mg/dL   GFR calc non Af Amer 77 (*) >90 mL/min   GFR calc Af Amer 90 (*) >90 mL/min  URINALYSIS, ROUTINE W REFLEX MICROSCOPIC      Result Value Ref Range   Color, Urine YELLOW  YELLOW   APPearance CLEAR  CLEAR   Specific Gravity, Urine 1.010  1.005 - 1.030   pH 5.5  5.0 - 8.0   Glucose, UA >1000 (*) NEGATIVE mg/dL   Hgb urine dipstick TRACE (*) NEGATIVE   Bilirubin Urine NEGATIVE  NEGATIVE   Ketones, ur NEGATIVE  NEGATIVE mg/dL   Protein, ur 30 (*) NEGATIVE mg/dL   Urobilinogen, UA 0.2  0.0 - 1.0 mg/dL   Nitrite NEGATIVE  NEGATIVE   Leukocytes, UA NEGATIVE  NEGATIVE  DIFFERENTIAL      Result Value Ref Range   Neutrophils Relative % 71  43 - 77 %   Neutro Abs 8.0 (*) 1.7 - 7.7 K/uL   Lymphocytes Relative 19  12 - 46 %   Lymphs Abs 2.2  0.7 - 4.0 K/uL   Monocytes Relative 7  3 - 12 %   Monocytes Absolute 0.8  0.1 - 1.0 K/uL   Eosinophils Relative 2  0 - 5 %   Eosinophils Absolute 0.3  0.0 - 0.7 K/uL   Basophils Relative 0  0 - 1 %   Basophils Absolute 0.0  0.0 - 0.1 K/uL  URINE MICROSCOPIC-ADD ON      Result Value Ref Range   Squamous Epithelial / LPF RARE  RARE   WBC, UA 0-2  <3 WBC/hpf   RBC / HPF 7-10  <3 RBC/hpf   Bacteria, UA RARE  RARE   Urine-Other RARE YEAST    CBG MONITORING, ED  Result Value Ref Range   Glucose-Capillary >600 (*) 70 - 99 mg/dL  CBG MONITORING, ED       Result Value Ref Range   Glucose-Capillary 326 (*) 70 - 99 mg/dL   Comment 1 Notify RN     Comment 2 Documented in Chart     MDM   Final diagnoses:  Hyperglycemia  Monilial vulvovaginitis  Anemia    Uncontrolled diabetes secondary to medication noncompliance. CBG is greater than 600. He'll be started on IV fluids and IV insulin. Has since she will not get a prescription for insulin filled, she will need to be admitted in order to keep sugars under control until he was found for her to get her insulin for use at home. Case is discussed with the internal medicine teaching service who agreed to admit the patient.    Delora Fuel, MD [phone]

## 2014-02-09 DIAGNOSIS — Z91199 Patient's noncompliance with other medical treatment and regimen due to unspecified reason: Secondary | ICD-10-CM

## 2014-02-09 DIAGNOSIS — A499 Bacterial infection, unspecified: Secondary | ICD-10-CM

## 2014-02-09 DIAGNOSIS — N760 Acute vaginitis: Secondary | ICD-10-CM

## 2014-02-09 DIAGNOSIS — B3731 Acute candidiasis of vulva and vagina: Secondary | ICD-10-CM | POA: Diagnosis present

## 2014-02-09 DIAGNOSIS — R739 Hyperglycemia, unspecified: Secondary | ICD-10-CM | POA: Diagnosis present

## 2014-02-09 DIAGNOSIS — B373 Candidiasis of vulva and vagina: Secondary | ICD-10-CM | POA: Diagnosis present

## 2014-02-09 DIAGNOSIS — IMO0001 Reserved for inherently not codable concepts without codable children: Secondary | ICD-10-CM

## 2014-02-09 DIAGNOSIS — E1165 Type 2 diabetes mellitus with hyperglycemia: Secondary | ICD-10-CM

## 2014-02-09 DIAGNOSIS — Z9119 Patient's noncompliance with other medical treatment and regimen: Secondary | ICD-10-CM

## 2014-02-09 DIAGNOSIS — B9689 Other specified bacterial agents as the cause of diseases classified elsewhere: Secondary | ICD-10-CM

## 2014-02-09 DIAGNOSIS — A5901 Trichomonal vulvovaginitis: Secondary | ICD-10-CM

## 2014-02-09 LAB — COMPREHENSIVE METABOLIC PANEL
ALT: 16 U/L (ref 0–35)
AST: 14 U/L (ref 0–37)
Albumin: 3.1 g/dL — ABNORMAL LOW (ref 3.5–5.2)
Alkaline Phosphatase: 150 U/L — ABNORMAL HIGH (ref 39–117)
BUN: 12 mg/dL (ref 6–23)
CALCIUM: 9 mg/dL (ref 8.4–10.5)
CO2: 24 mEq/L (ref 19–32)
CREATININE: 0.92 mg/dL (ref 0.50–1.10)
Chloride: 89 mEq/L — ABNORMAL LOW (ref 96–112)
GFR calc Af Amer: 90 mL/min — ABNORMAL LOW (ref >90)
GFR calc non Af Amer: 77 mL/min — ABNORMAL LOW (ref >90)
GLUCOSE: 754 mg/dL — AB (ref 70–99)
Potassium: 4.2 mEq/L (ref 3.7–5.3)
SODIUM: 129 meq/L — AB (ref 137–147)
TOTAL PROTEIN: 7.2 g/dL (ref 6.0–8.3)
Total Bilirubin: <0.2 mg/dL — ABNORMAL LOW (ref 0.3–1.2)

## 2014-02-09 LAB — GLUCOSE, CAPILLARY
GLUCOSE-CAPILLARY: 324 mg/dL — AB (ref 70–99)
GLUCOSE-CAPILLARY: 328 mg/dL — AB (ref 70–99)
Glucose-Capillary: 344 mg/dL — ABNORMAL HIGH (ref 70–99)
Glucose-Capillary: 245 mg/dL — ABNORMAL HIGH (ref 70–99)
Glucose-Capillary: 174 mg/dL — ABNORMAL HIGH (ref 70–99)

## 2014-02-09 LAB — WET PREP, GENITAL

## 2014-02-09 LAB — BASIC METABOLIC PANEL
BUN: 12 mg/dL (ref 6–23)
CO2: 25 meq/L (ref 19–32)
Calcium: 8.4 mg/dL (ref 8.4–10.5)
Chloride: 99 mEq/L (ref 96–112)
Creatinine, Ser: 0.81 mg/dL (ref 0.50–1.10)
GFR calc Af Amer: >90 mL/min (ref >90)
GFR calc non Af Amer: >90 mL/min (ref >90)
GLUCOSE: 328 mg/dL — AB (ref 70–99)
POTASSIUM: 3.7 meq/L (ref 3.7–5.3)
Sodium: 136 mEq/L — ABNORMAL LOW (ref 137–147)

## 2014-02-09 LAB — CBG MONITORING, ED
GLUCOSE-CAPILLARY: 333 mg/dL — AB (ref 70–99)
Glucose-Capillary: 326 mg/dL — ABNORMAL HIGH (ref 70–99)
Glucose-Capillary: 339 mg/dL — ABNORMAL HIGH (ref 70–99)

## 2014-02-09 LAB — HIV ANTIBODY (ROUTINE TESTING W REFLEX): HIV: NONREACTIVE

## 2014-02-09 LAB — HEMOGLOBIN A1C
HEMOGLOBIN A1C: 14.9 % — AB (ref <5.7)
Mean Plasma Glucose: 381 mg/dL — ABNORMAL HIGH (ref <117)

## 2014-02-09 MED ORDER — INSULIN ASPART 100 UNIT/ML ~~LOC~~ SOLN: 10.0000 [IU] | Freq: Once | SUBCUTANEOUS | Status: DC

## 2014-02-09 MED ORDER — INSULIN ASPART PROT & ASPART (70-30 MIX) 100 UNIT/ML ~~LOC~~ SUSP
25.0000 [IU] | Freq: Two times a day (BID) | SUBCUTANEOUS | Status: DC
  Administered 2014-02-09: 25 [IU] via SUBCUTANEOUS
  Filled 2014-02-09: qty 10

## 2014-02-09 MED ORDER — ENOXAPARIN SODIUM 40 MG/0.4ML ~~LOC~~ SOLN
40.0000 mg | SUBCUTANEOUS | Status: DC
  Filled 2014-02-09: qty 0.4

## 2014-02-09 MED ORDER — SODIUM CHLORIDE 0.9 % IV BOLUS (SEPSIS): 1000.0000 mL | Freq: Once | INTRAVENOUS | Status: AC

## 2014-02-09 MED ORDER — INSULIN ASPART 100 UNIT/ML ~~LOC~~ SOLN
0.0000 [IU] | Freq: Three times a day (TID) | SUBCUTANEOUS | Status: DC
  Administered 2014-02-09: 11 [IU] via SUBCUTANEOUS
  Administered 2014-02-09: 3 [IU] via SUBCUTANEOUS

## 2014-02-09 MED ORDER — NYSTATIN 100000 UNIT/GM EX POWD: CUTANEOUS | Status: DC

## 2014-02-09 MED ORDER — NYSTATIN 100000 UNIT/GM EX POWD
Freq: Two times a day (BID) | CUTANEOUS | Status: DC
  Administered 2014-02-09 (×2): via TOPICAL
  Filled 2014-02-09: qty 15

## 2014-02-09 MED ORDER — FLUCONAZOLE 150 MG PO TABS
150.0000 mg | ORAL_TABLET | Freq: Once | ORAL | Status: AC
  Administered 2014-02-09: 150 mg via ORAL
  Filled 2014-02-09: qty 1

## 2014-02-09 MED ORDER — INSULIN ASPART PROT & ASPART (70-30 MIX) 100 UNIT/ML ~~LOC~~ SUSP: 25.0000 [IU] | Freq: Two times a day (BID) | SUBCUTANEOUS | Status: DC

## 2014-02-09 MED ORDER — METRONIDAZOLE 500 MG PO TABS
2000.0000 mg | ORAL_TABLET | Freq: Once | ORAL | Status: AC
  Administered 2014-02-09: 2000 mg via ORAL
  Filled 2014-02-09: qty 4

## 2014-02-09 MED ORDER — METRONIDAZOLE 500 MG PO TABS: 2000.0000 mg | ORAL_TABLET | Freq: Once | ORAL | Status: DC

## 2014-02-09 NOTE — ED Notes (Signed)
The pt is  Sleeping now

## 2014-02-09 NOTE — ED Notes (Signed)
Report called to the floor to frances rn

## 2014-02-09 NOTE — Care Management Note (Signed)
    Page 1 of 1   02/09/2014     6:10:41 PM   CARE MANAGEMENT NOTE 02/09/2014  Patient:  LIORA, AMRINE   Account Number:  [id number]  Date Initiated:  02/09/2014  Documentation initiated by:  [facility]  Subjective/Objective Assessment:   adm: vaginal itching     Action/Plan:   discharge planning   Anticipated DC Date:  02/09/2014   Anticipated DC Plan:  [place]  Medication Assistance      Choice offered to / List presented to:             Status of service:  Completed, signed off Medicare Important Message given?   (If response is "NO", the following Medicare IM given date fields will be blank) Date Medicare IM given:   Date Additional Medicare IM given:    Discharge Disposition:  HOME/SELF CARE  Per UR Regulation:    If discussed at Long Length of Stay Meetings, dates discussed:    Comments:  02/09/14 10:00 CM spoke with pt in room and gave pt HT handout for generic diabetic medication discount/free meds. Pt has medicaid.  Pt states she needs another meter.  CM gave pt info on RELion meters which can be purchased at [facility] for less than 15.00.  Pt states she is going to have a job starting this week and feels she will be able to better afford her medications if she's working.  No other CM needs were communicated.  Mariane Masters, BSN, CM [phone].

## 2014-02-09 NOTE — Progress Notes (Signed)
Discharge instructions gone over with patient. Prescriptions were faxed to patient's pharmacy. Follow up appointment to be made. Stop smoking information given. Home medications gone over. Good hand washing gone over. Information on STD gone over. My chart is active. Patient verbalized understanding of instructions and verbalized importance of diabetic medications, diet, and checking blood glucose.

## 2014-02-09 NOTE — H&P (Signed)
INTERNAL MEDICINE TEACHING SERVICE Attending Admission Note  Date: 02/09/2014  Patient name: Jane [name]  Medical record number: [id number]  Date of birth: 01/26/1976    I have seen and evaluated Jane [name] and discussed their care with the Residency Team.  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 She admits to symptoms over one week not associated with vaginal odor or discharge. She states she is sexually active with one partner and does not use condoms. She admits to use of topical neosporin which has only made her symptoms worse. She reports that she has been unable to afford insulin since February of this year. She does admit to tobacco use and alcohol use daily. The ED was concerned regarding her CBG over 600 and asked for admission. Dr. Heber Godley performed a GU exam in the ED and noted vaginal exudative discharge, erythema, desquamation of the external genitalia. Her GU wet prep was notable for yeast, trichomonas, clue cells.  Filed Vitals:   02/09/14 0615  BP: 128/72  Pulse: 86  Temp: 98.2 F (36.8 C)  Resp: 18   GEN: AAOx3, NAD HEENT: EOMI, PERRL, no icterus, no adenopathy, moist mucosa. CV: S1S2, no m/r/g, RRR PULM: CTA bilat ABD/GI: Soft, NT, +BS, no guarding, no distention. LE/UE: 2/4 pulses, no c/c/e. NEURO: CN II-XII intact, no focal deficits.  A/P: 1) Vulvovaginitis: Agree with fluconazole tx, nystatin powder given, and dose of Flagyl. Educate her on informing her partner he must be treated for Trichomonas as well.  2) Uncontrolled Type 2 IDDM w/peripheral neuropathy: I do not suspect DKA or hyperosmolar nonketotic state. CBG has improved with insulin tx. Diabetes coordinator assistance with affordability appreciated. Agree with novolog 70/30  25 units [place] bid and outpatient follow up. 3) Iron deficiency anemia: Hgb stable. Needs outpatient follow up to investigate iron deficiency vs other  causes.  -medically stable for D/C.  Dominic Pea, DO, [facility] 02/09/2014, 11:20 AM

## 2014-02-09 NOTE — H&P (Signed)
Date: 02/09/2014               Patient Name:  Jane [name] MRN: [id number]  DOB: 08-Mar-1976 Age / Sex: 38 y.o., female   PCP: Juluis Mire, MD         Medical Service: Internal Medicine Teaching Service         Attending Physician: Dr. Dominic Pea, DO    First Contact: Dr. Lum Babe Pager: [phone]  Second Contact: Dr. Clinton Gallant Pager: [phone]       After Hours (After 5p/  First Contact Pager: [phone]  weekends / holidays): Second Contact Pager: [phone]   Chief Complaint: Vaginal itching  History of Present Illness: Jane [name] is a 38 year old female with a PMH of uncontrolled insulin dependent T2DM, HTN, CKD Stage 1, Diabetic neuropathy, HLD, Iron deficiency anemia. She presented to [facility] today with a chief complaint of vaginal itching.  She reports that she has had vaginal itching and irritation for over a week, she did not think about going to see her PCP for this complaint but she reports this happens from time to time when her blood sugars are high.  She has not noticed any discharge or abnormal vaginal odor. She reports that she is sexually active with 1 person (female) last sexual activity was within the past week and she does not use protection.  She has tried treatment with topical neosporin.  In the ED she was found to have a CBG of >600 without evidence of ketones. She denied nausea, vomiting, diarrhea, abdominal pain, blurry vision, fever or chills.  Internal medicine teaching service was asked to admit. Of note she reports that since february she has not taken any insulin due to cost  and has not checked her home blood sugars in over 2 years.  She reports recently loosing a second source of income and her $3 dollar medicaid copays are too expensive for her to afford.  She has not taken any of her prescribed medications because of this.  She does report smoking 3 black and mild cigarettes and drinking 4-5 alcoholic mixed drinks a day. Meds: Current  Facility-Administered Medications  Medication Dose Route Frequency Provider Last Rate Last Dose  . 0.9 %  sodium chloride infusion  1,000 mL Intravenous Continuous Delora Fuel, MD      . insulin aspart (novoLOG) injection 0-15 Units  0-15 Units Subcutaneous TID WC Jessee Avers, MD      . insulin aspart protamine- aspart (NOVOLOG MIX 70/30) injection 25 Units  25 Units Subcutaneous BID WC Jessee Avers, MD      . sodium chloride 0.9 % bolus 1,000 mL  1,000 mL Intravenous Once Joni Reining, DO       No current outpatient prescriptions on file.    Allergies: Allergies as of 02/08/2014  . (No Known Allergies)   Past Medical History  Diagnosis Date  . Asthma   . Diabetes mellitus   . Hyperlipidemia   . Tobacco abuse   . Seasonal allergies    Past Surgical History  Procedure Laterality Date  . Tubal ligation    . Cesarean section     Family History  Problem Relation Age of Onset  . Stroke Mother   . Diabetes Father   . Diabetes Mother   . Diabetes Maternal Grandmother   . Diabetes Maternal Grandfather   . Hypertension Mother   . Hypertension Father   . Cancer Paternal Grandfather     Prostate  .  Aneurysm Mother    History   Social History  . Marital Status: Legally Separated    Spouse Name: N/A    Number of Children: N/A  . Years of Education: 13   Occupational History  .     Social History Main Topics  . Smoking status: Former Smoker -- 0.30 packs/day for 1 years    Types: Cigarettes    Quit date: 09/02/2010  . Smokeless tobacco: Not on file  . Alcohol Use: Yes  . Drug Use: No  . Sexual Activity: Not on file   Other Topics Concern  . Not on file   Social History Narrative  . No narrative on file    Review of Systems: Review of Systems  Constitutional: Negative for fever, chills and malaise/fatigue.  Eyes: Negative for blurred vision.  Respiratory: Negative for shortness of breath.   Cardiovascular: Negative for chest pain and leg swelling.    Genitourinary: Positive for dysuria (itching). Negative for frequency, hematuria and flank pain.  Musculoskeletal: Negative for myalgias.  Neurological: Negative for dizziness and weakness.  Endo/Heme/Allergies: Positive for polydipsia.  Psychiatric/Behavioral: Negative for depression.    Physical Exam: Blood pressure 120/76, pulse 102, temperature 99.2 F (37.3 C), temperature source Oral, resp. rate 18, height 5\' 5"  (1.651 m), weight 220 lb (99.791 kg), last menstrual period 01/18/2014, SpO2 99.00%. Physical Exam  Nursing note and vitals reviewed. Constitutional: She is oriented to person, place, and time. No distress.  HENT:  Head: Normocephalic and atraumatic.  Mouth/Throat: Oropharynx is clear and moist. No oropharyngeal exudate.  Eyes: Conjunctivae and EOM are normal. Pupils are equal, round, and reactive to light.  Cardiovascular: Normal rate, regular rhythm and normal heart sounds.   Pulmonary/Chest: Effort normal and breath sounds normal. No respiratory distress. She has no wheezes.  Abdominal: Soft. Bowel sounds are normal. She exhibits no distension. There is no tenderness.  Genitourinary: Cervix exhibits no motion tenderness. Vulva exhibits erythema and exudate. Creamy  odorless  white and vaginal discharge found.  Chaperone: Teacher, early years/pre intertriginous desquamation of external genitalia  Labia majora hyperemic   Musculoskeletal: She exhibits no edema.  Neurological: She is alert and oriented to person, place, and time.  Skin: She is not diaphoretic.     Lab results: Basic Metabolic Panel:  Recent Labs  02/08/14 2215  NA 129*  K 4.2  CL 89*  CO2 24  GLUCOSE 754*  BUN 12  CREATININE 0.92  CALCIUM 9.0   Liver Function Tests:  Recent Labs  02/08/14 2215  AST 14  ALT 16  ALKPHOS 150*  BILITOT <0.2*  PROT 7.2  ALBUMIN 3.1*   No results found for this basename: LIPASE, AMYLASE,  in the last 72 hours No results found for this basename: AMMONIA,   in the last 72 hours CBC:  Recent Labs  02/08/14 2215  WBC 11.4*  NEUTROABS 8.0*  HGB 10.8*  HCT 32.9*  MCV 76.9*  PLT 296   Cardiac Enzymes: No results found for this basename: CKTOTAL, CKMB, CKMBINDEX, TROPONINI,  in the last 72 hours BNP: No results found for this basename: PROBNP,  in the last 72 hours D-Dimer: No results found for this basename: DDIMER,  in the last 72 hours CBG:  Recent Labs  02/08/14 2204  GLUCAP >600*  Urinalysis:  Recent Labs  02/08/14 2325  COLORURINE YELLOW  LABSPEC 1.010  PHURINE 5.5  GLUCOSEU >1000*  HGBUR TRACE*  BILIRUBINUR NEGATIVE  KETONESUR NEGATIVE  PROTEINUR 30*  UROBILINOGEN 0.2  NITRITE NEGATIVE  LEUKOCYTESUR NEGATIVE    Imaging results:  No results found.  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 Glucose 754, No urine ketones, Calculated Osmolality 304.2, no changes in mental status. - Admit to Med Surg (Observation) - Given 2L NS bolus and 10 units of Novolog in ED - Give additional 1L bolus. - Novolog 70/30 25units now. - CBG q2 - check A1c  CKD Stage 1 due to Diabetes - Patient should be restarted on lisinopril on discharge.    Vulvovganitis- Complicated given uncontrolled diabetes mellitus - F/u wet prep, GC/ Chlamydia  - Fluconazole 150mg  x72h (3 doses) - Nystatin powder for external candadisis.  Noncompliance with medical therapy -Consult case management in AM.  Hyperlipidemia - Patient should be restarted on Simvastatin on discharge  Iron deficiency anemia -Hgb today improved to 10.8 - Patient should be restarted on iron supplementation on discharge.  Dispo: Disposition is deferred at this time, awaiting improvement of current medical problems. Anticipated discharge in approximately 1 day(s).   The patient does have a current PCP (Juluis Mire, MD) and does need an [facility] hospital follow-up  appointment after discharge.  The patient does not have transportation limitations that hinder transportation to clinic appointments.  Signed: Joni Reining, DO 02/09/2014, 12:45 AM

## 2014-02-09 NOTE — ED Notes (Signed)
3rd liter of nss added ti iv at 141ml/hr

## 2014-02-09 NOTE — Progress Notes (Signed)
Inpatient Diabetes Program Recommendations  AACE/ADA: New Consensus Statement on Inpatient Glycemic Control (2013)  Target Ranges:  Prepandial:   less than 140 mg/dL      Peak postprandial:   less than 180 mg/dL (1-2 hours)      Critically ill patients:  140 - 180 mg/dL   Reason for Assessment: Received referral for this patient.  Patient admitted with vulvovaginitis and hyperglycemia.  Glucose 754 mg/dl on admission.  Diabetes history: Type 2 DM Home DM meds: 70/30 insulin- 25 units bidwc   **Note patient states in H&P that she hasn't taken her insulin since February and hasn't checked her CBGs for 2 years b/c she cannot afford her co-pays.  Patient has Medicaid access, and can get most of her Rxs for $3.  Per patient, she lost her 2nd source of income and cannot even afford her $3 co-pays.  Of note, pt does smoke tobacco and drink alcoholic beverages daily.  **Received referral for this patient requesting CBG meter.  Hospital does Not give out free CBG meters.  Patient also does not qualify for the patient assistance [place] program b/c she has government health coverage (Medicaid).    **Recommend patient purchase an inexpensive CBG meter and strips OTC at [facility].  Reli-ON brand CBG Meter is $16 and a box of 50 strips is $9.   Will follow. Wyn Quaker RN, MSN, CDE Diabetes Coordinator Inpatient Diabetes Program Team Pager: [phone] (8a-10p)

## 2014-02-09 NOTE — Discharge Summary (Signed)
Name: Jane [name] MRN: [id number] DOB: [birth year] 38 y.o. PCP: Juluis Mire, MD  Date of Admission: 02/08/2014 10:21 PM Date of Discharge: 02/09/2014 Attending Physician: Dominic Pea, DO  Discharge Diagnosis: 1. Hyperglycemia in poorly uncontrolled DM 2 2. BV 3. Trichomonas 4. Vulvovaginitis 5. Medication non-compliance   Discharge Medications:   Medication List         insulin aspart protamine- aspart (70-30) 100 UNIT/ML injection  Commonly known as:  NOVOLOG MIX 70/30  Inject 0.25 mLs (25 Units total) into the skin 2 (two) times daily with a meal.     metroNIDAZOLE 500 MG tablet  Commonly known as:  FLAGYL  Take 4 tablets (2,000 mg total) by mouth once.     nystatin 100000 UNIT/GM Powd  Apply powder to vaginal area up to 2 times a day        Disposition and follow-up:   Jane [name] was discharged from [facility] in Stable condition.  At the hospital follow up visit please address:  1.  Resolution of vaginal itching with STD labs pending, getting refill for test strips, DM control   2.  Labs / imaging needed at time of follow-up: none  3.  Pending labs/ test needing follow-up: HgbA1c, HIV, GC/chlamydia probe  Follow-up Appointments: Follow-up Information   Follow up with Juluis Mire, MD. (the office will call you for the appointment time)    Specialty:  Internal Medicine   Contact information:   [place] [address] [phone]      Admission HPI: Jane [name] is a 38 year old female with a PMH of uncontrolled insulin dependent T2DM, HTN, CKD Stage 1, Diabetic neuropathy, HLD, Iron deficiency anemia.  She presented to [facility] today with a chief complaint of vaginal itching. She reports that she has had vaginal itching and irritation for over a week, she did not think about going to see her PCP for this complaint but she reports this happens from time to time when her blood sugars are high. She  has not noticed any discharge or abnormal vaginal odor. She reports that she is sexually active with 1 person (female) last sexual activity was within the past week and she does not use protection. She has tried treatment with topical neosporin.  In the ED she was found to have a CBG of >600 without evidence of ketones. She denied nausea, vomiting, diarrhea, abdominal pain, blurry vision, fever or chills. Internal medicine teaching service was asked to admit.  Of note she reports that since february she has not taken any insulin due to cost and has not checked her home blood sugars in over 2 years. She reports recently loosing a second source of income and her $3 dollar medicaid copays are too expensive for her to afford. She has not taken any of her prescribed medications because of this. She does report smoking 3 black and mild cigarettes and drinking 4-5 alcoholic mixed drinks a day.   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 This immediately resolved when pt was given IVF and her home Novolog 70/30 25units. Her HgbA1c was pending. She was discharged on her home dose of insulin with titration to be achieved by her PCP or outpt.   # Vulvovganitis- Complicated given  uncontrolled diabetes mellitus: Pt had been using neosporin for itchiness and unprotected sex with a promiscuous partner. Wet prep showed yeast, clue cells, and trichomonas. Therefore full STD workup was collected with  GC/ Chlamydia and HIV labs pending at d/c. She was treated for BV with Fluconazole 150mg  and Nystatin powder for external candidiasis and flagyl 2g for trichomonas. She was given a prescription for her partner to be treated and advised on OTC care for her vaginal soreness.   #  Noncompliance with medical therapy: Pt seems to have different priorities including indulgent behavior with alcohol in drugs at the expense of her medications and CBG supplies. Case management was consulted and pt already has Medicaid therefore no other options are available and medication copays are only $3.00 for the patient.   # Iron deficiency anemia: Hgb 10.8, Patient should be restarted on iron supplementation possible.   Discharge Vitals:   BP 128/72  Pulse 86  Temp(Src) 98.2 F (36.8 C) (Oral)  Resp 18  Ht 5\' 5"  (1.651 m)  Wt 220 lb (99.791 kg)  BMI 36.61 kg/m2  SpO2 99%  LMP 01/18/2014  Discharge Labs:  Results for orders placed during the hospital encounter of 02/08/14 (from the past 24 hour(s))  CBG MONITORING, ED     Status: Abnormal   Collection Time    02/08/14 10:04 PM      Result Value Ref Range   Glucose-Capillary >600 (*) 70 - 99 mg/dL  CBC     Status: Abnormal   Collection Time    02/08/14 10:15 PM      Result Value Ref Range   WBC 11.4 (*) 4.0 - 10.5 K/uL   RBC 4.28  3.87 - 5.11 MIL/uL   Hemoglobin 10.8 (*) 12.0 - 15.0 g/dL   HCT 32.9 (*) 36.0 - 46.0 %   MCV 76.9 (*) 78.0 - 100.0 fL   MCH 25.2 (*) 26.0 - 34.0 pg   MCHC 32.8  30.0 - 36.0 g/dL   RDW 14.5  11.5 - 15.5 %   Platelets 296  150 - 400 K/uL  COMPREHENSIVE METABOLIC PANEL     Status: Abnormal   Collection Time    02/08/14 10:15 PM      Result Value Ref Range   Sodium 129 (*) 137 - 147 mEq/L   Potassium 4.2  3.7 - 5.3 mEq/L   Chloride 89 (*) 96 - 112 mEq/L   CO2 24  19 - 32 mEq/L   Glucose, Bld 754 (*) 70 - 99 mg/dL   BUN 12  6 - 23 mg/dL   Creatinine, Ser 0.92  0.50 - 1.10 mg/dL   Calcium 9.0  8.4 - 10.5 mg/dL   Total Protein 7.2  6.0 - 8.3 g/dL   Albumin 3.1 (*) 3.5 - 5.2 g/dL   AST 14  0 - 37 U/L   ALT 16  0 - 35 U/L   Alkaline Phosphatase 150 (*) 39 - 117 U/L   Total Bilirubin <0.2 (*) 0.3 - 1.2 mg/dL   GFR calc non Af Amer 77 (*) >90 mL/min   GFR calc Af Amer 90 (*) >90 mL/min    DIFFERENTIAL     Status: Abnormal   Collection Time    02/08/14 10:15 PM      Result Value Ref Range   Neutrophils Relative % 71  43 - 77 %   Neutro Abs 8.0 (*) 1.7 - 7.7 K/uL   Lymphocytes Relative 19  12 - 46 %  Lymphs Abs 2.2  0.7 - 4.0 K/uL   Monocytes Relative 7  3 - 12 %   Monocytes Absolute 0.8  0.1 - 1.0 K/uL   Eosinophils Relative 2  0 - 5 %   Eosinophils Absolute 0.3  0.0 - 0.7 K/uL   Basophils Relative 0  0 - 1 %   Basophils Absolute 0.0  0.0 - 0.1 K/uL  URINALYSIS, ROUTINE W REFLEX MICROSCOPIC     Status: Abnormal   Collection Time    02/08/14 11:25 PM      Result Value Ref Range   Color, Urine YELLOW  YELLOW   APPearance CLEAR  CLEAR   Specific Gravity, Urine 1.010  1.005 - 1.030   pH 5.5  5.0 - 8.0   Glucose, UA >1000 (*) NEGATIVE mg/dL   Hgb urine dipstick TRACE (*) NEGATIVE   Bilirubin Urine NEGATIVE  NEGATIVE   Ketones, ur NEGATIVE  NEGATIVE mg/dL   Protein, ur 30 (*) NEGATIVE mg/dL   Urobilinogen, UA 0.2  0.0 - 1.0 mg/dL   Nitrite NEGATIVE  NEGATIVE   Leukocytes, UA NEGATIVE  NEGATIVE  URINE MICROSCOPIC-ADD ON     Status: None   Collection Time    02/08/14 11:25 PM      Result Value Ref Range   Squamous Epithelial / LPF RARE  RARE   WBC, UA 0-2  <3 WBC/hpf   RBC / HPF 7-10  <3 RBC/hpf   Bacteria, UA RARE  RARE   Urine-Other RARE YEAST    CBG MONITORING, ED     Status: Abnormal   Collection Time    02/09/14 12:43 AM      Result Value Ref Range   Glucose-Capillary 326 (*) 70 - 99 mg/dL   Comment 1 Notify RN     Comment 2 Documented in Chart    WET PREP, GENITAL     Status: Abnormal   Collection Time    02/09/14  1:25 AM      Result Value Ref Range   Yeast Wet Prep HPF POC FEW (*) NONE SEEN   Trich, Wet Prep FEW (*) NONE SEEN   Clue Cells Wet Prep HPF POC MODERATE (*) NONE SEEN   WBC, Wet Prep HPF POC FEW (*) NONE SEEN  CBG MONITORING, ED     Status: Abnormal   Collection Time    02/09/14  1:57 AM      Result Value Ref Range    Glucose-Capillary 339 (*) 70 - 99 mg/dL   Comment 1 Notify RN     Comment 2 Documented in Chart    CBG MONITORING, ED     Status: Abnormal   Collection Time    02/09/14  2:28 AM      Result Value Ref Range   Glucose-Capillary 333 (*) 70 - 99 mg/dL   Comment 1 Notify RN     Comment 2 Documented in Chart    GLUCOSE, CAPILLARY     Status: Abnormal   Collection Time    02/09/14  4:01 AM      Result Value Ref Range   Glucose-Capillary 324 (*) 70 - 99 mg/dL   Comment 1 Notify RN    BASIC METABOLIC PANEL     Status: Abnormal   Collection Time    02/09/14  4:56 AM      Result Value Ref Range   Sodium 136 (*) 137 - 147 mEq/L   Potassium 3.7  3.7 - 5.3 mEq/L   Chloride 99  96 - 112 mEq/L   CO2 25  19 - 32 mEq/L   Glucose, Bld 328 (*) 70 - 99 mg/dL   BUN 12  6 - 23 mg/dL   Creatinine, Ser 0.81  0.50 - 1.10 mg/dL   Calcium 8.4  8.4 - 10.5 mg/dL   GFR calc non Af Amer >90  >90 mL/min   GFR calc Af Amer >90  >90 mL/min  GLUCOSE, CAPILLARY     Status: Abnormal   Collection Time    02/09/14  5:50 AM      Result Value Ref Range   Glucose-Capillary 344 (*) 70 - 99 mg/dL   Comment 1 Notify RN    GLUCOSE, CAPILLARY     Status: Abnormal   Collection Time    02/09/14  8:03 AM      Result Value Ref Range   Glucose-Capillary 328 (*) 70 - 99 mg/dL  GLUCOSE, CAPILLARY     Status: Abnormal   Collection Time    02/09/14  9:53 AM      Result Value Ref Range   Glucose-Capillary 245 (*) 70 - 99 mg/dL    Signed: Clinton Gallant, MD 02/09/2014, 10:56 AM   Time Spent on Discharge: >30 minutes Services Ordered on Discharge: none Equipment Ordered on Discharge: none

## 2014-02-09 NOTE — Progress Notes (Signed)
Telephone call to ED for report. Nurse will call me back

## 2014-02-10 LAB — GC/CHLAMYDIA PROBE AMP
CT PROBE, AMP APTIMA: NEGATIVE
GC PROBE AMP APTIMA: NEGATIVE

## 2014-02-10 NOTE — Discharge Summary (Signed)
  Date: 02/10/2014  Patient name: Jane [name]  Medical record number: [id number]  Date of birth: [birth year]   This patient has been seen and the plan of care was discussed with the house staff. Please see their note for complete details. I concur with their findings and plan.  Dominic Pea, DO, [facility] 02/10/2014, 2:29 PM

## 2014-02-12 ENCOUNTER — Ambulatory Visit (INDEPENDENT_AMBULATORY_CARE_PROVIDER_SITE_OTHER): Payer: Medicaid Other | Admitting: Internal Medicine

## 2014-02-12 ENCOUNTER — Encounter: Payer: Self-pay | Admitting: Internal Medicine

## 2014-02-12 VITALS — BP 139/84 | HR 93 | Temp 99.0°F | Ht 65.0 in | Wt 229.7 lb

## 2014-02-12 DIAGNOSIS — B3731 Acute candidiasis of vulva and vagina: Secondary | ICD-10-CM

## 2014-02-12 DIAGNOSIS — E1165 Type 2 diabetes mellitus with hyperglycemia: Secondary | ICD-10-CM

## 2014-02-12 DIAGNOSIS — IMO0001 Reserved for inherently not codable concepts without codable children: Secondary | ICD-10-CM

## 2014-02-12 DIAGNOSIS — B373 Candidiasis of vulva and vagina: Secondary | ICD-10-CM

## 2014-02-12 DIAGNOSIS — E118 Type 2 diabetes mellitus with unspecified complications: Secondary | ICD-10-CM

## 2014-02-12 DIAGNOSIS — IMO0002 Reserved for concepts with insufficient information to code with codable children: Secondary | ICD-10-CM

## 2014-02-12 DIAGNOSIS — R300 Dysuria: Secondary | ICD-10-CM

## 2014-02-12 LAB — GLUCOSE, CAPILLARY: Glucose-Capillary: 301 mg/dL — ABNORMAL HIGH (ref 70–99)

## 2014-02-12 MED ORDER — ACETAMINOPHEN-CODEINE #3 300-30 MG PO TABS: 1.0000 | ORAL_TABLET | Freq: Four times a day (QID) | ORAL | Status: DC | PRN

## 2014-02-12 MED ORDER — FLUCONAZOLE 150 MG PO TABS
150.0000 mg | ORAL_TABLET | ORAL | Status: DC
Start: 2014-02-12 — End: 2014-03-01

## 2014-02-12 MED ORDER — GLUCOSE BLOOD VI STRP: ORAL_STRIP | Status: DC

## 2014-02-12 NOTE — Assessment & Plan Note (Signed)
A: Patient has uncontrolled DM due to medication non compliance and vaginal infection. Patient recently restarted her insulin regimen. She states that she is ready to work on controlling her DM. She requests test strips be prescribed. She does not remember which glucometer that she has.  A: I prescibed both test strips to ensure that she can get some that work. I rec f/u in [facility] in 1-2 weeks for DM recheck of glucometer log.

## 2014-02-12 NOTE — Progress Notes (Signed)
Patient ID: Jane [name], female   DOB: Nov 28, 1975, 38 y.o.   MRN: [id number]    Subjective:   Patient ID: Jane [name] female   DOB: [birth year] 38 y.o.   MRN: [id number]  HPI: [name] is a 38 y.o. woman with a pmhx of uncontrolled DM (A1C > 12 for 4+ years) who comes to the clinic for hospital f/u after receiving treatment for vulvovaginitis and trichomonal infection. The patient has had continued pain and discomfort since discharge. The pain is located in her perineal area. She has associated symptoms of dysuria. Further, she is having trouble sitting due to the pain. She reports difficulty applying the nystatin powder to the affected area. She admits to feeling feverish this morning, but her temp was less than 100. She denies diarrhea constipation melena and hematochezia.     Past Medical History  Diagnosis Date  . Asthma   . Diabetes mellitus   . Hyperlipidemia   . Tobacco abuse   . Seasonal allergies    Current Outpatient Prescriptions  Medication Sig Dispense Refill  . insulin aspart protamine- aspart (NOVOLOG MIX 70/30) (70-30) 100 UNIT/ML injection Inject 0.25 mLs (25 Units total) into the skin 2 (two) times daily with a meal.  10 mL  11  . nystatin (MYCOSTATIN/NYSTOP) 100000 UNIT/GM POWD Apply powder to vaginal area up to 2 times a day  30 g  0  . metroNIDAZOLE (FLAGYL) 500 MG tablet Take 4 tablets (2,000 mg total) by mouth once.  4 tablet  0   No current facility-administered medications for this visit.   Family History  Problem Relation Age of Onset  . Stroke Mother   . Diabetes Father   . Diabetes Mother   . Diabetes Maternal Grandmother   . Diabetes Maternal Grandfather   . Hypertension Mother   . Hypertension Father   . Cancer Paternal Grandfather     Prostate  . Aneurysm Mother    History   Social History  . Marital Status: Legally Separated    Spouse Name: N/A    Number of Children: N/A  . Years of Education: 13   Occupational  History  .     Social History Main Topics  . Smoking status: Former Smoker -- 0.30 packs/day for 1 years    Types: Cigarettes    Quit date: 09/02/2010  . Smokeless tobacco: None  . Alcohol Use: Yes  . Drug Use: No  . Sexual Activity: None   Other Topics Concern  . None   Social History Narrative  . None   Review of Systems: Review of Systems  Constitutional: Negative for fever, chills and malaise/fatigue.  HENT: Negative for sore throat.   Respiratory: Negative for cough.   Cardiovascular: Negative for chest pain, palpitations and orthopnea.  Gastrointestinal: Negative for nausea, vomiting, abdominal pain, diarrhea, constipation, blood in stool and melena.  Genitourinary: Positive for dysuria. Negative for urgency, frequency and hematuria.  Skin: Positive for itching and rash.  Neurological: Negative for weakness and headaches.    Objective:  Physical Exam: Filed Vitals:   02/12/14 1413  BP: 139/84  Pulse: 93  Temp: 99 F (37.2 C)  TempSrc: Oral  Height: 5\' 5"  (1.651 m)  Weight: 229 lb 11.2 oz (104.191 kg)  SpO2: 100%   Physical Exam  Constitutional: She is oriented to person, place, and time. She appears well-developed and well-nourished. No distress.  Cardiovascular: Normal rate, regular rhythm, normal heart sounds and intact distal pulses.  Exam reveals  no gallop and no friction rub.   No murmur heard. Pulmonary/Chest: Effort normal and breath sounds normal. No respiratory distress. She has no wheezes. She has no rales.  Genitourinary:    There is rash, tenderness and lesion on the right labia. There is no injury on the right labia. There is rash, tenderness and lesion on the left labia. There is no injury on the left labia.  The entire external gentialia, anal and gluteal areas were inspected. There is induration with satellite lesions. There is no erythema or warmness. There are numerous small areas of skin breakdown along the inguinal crease and gluteal  crease. The patient is tender when spreading these areas. However, there is no palpable subcutaneous emphysema. No purulent drainage. Significant moisture  Within the skin folds.   Neurological: She is alert and oriented to person, place, and time.  Skin: She is not diaphoretic.  Psychiatric: She has a normal mood and affect. Her behavior is normal.    Assessment & Plan:

## 2014-02-12 NOTE — Assessment & Plan Note (Signed)
A: patient appears to have complicate fungal vulvovaginitis. She is having trouble applying the nystatin powder. I do no suspect superinfection at this time.   P: I recommended that the patient complete 3 doses of fluconazole q 72 hours. I rec that she continue nystatin powder as well. I rec that she contact an MD immediately for new or worsening symptoms. The patient agreed with this plan.

## 2014-02-12 NOTE — Patient Instructions (Signed)
Please take the anti-fungal medicine as prescribed. Please continue to use the nystatin powder. Please take the tylenol with codeine as directed for pain control.  Please follow up in clinic in 1-2 weeks with your glucometer.   Please contact MD immediately for new or worsening symptoms.

## 2014-02-12 NOTE — Assessment & Plan Note (Signed)
The patients dysuria is likely due to fungal vulvovaginitis. It is also possible due to bacterial cystitis. Plan to f/u UA and treat as indicated.

## 2014-02-13 LAB — URINALYSIS, MICROSCOPIC ONLY
Bacteria, UA: NONE SEEN
CRYSTALS: NONE SEEN
Casts: NONE SEEN

## 2014-02-13 LAB — URINALYSIS, ROUTINE W REFLEX MICROSCOPIC
BILIRUBIN URINE: NEGATIVE
Glucose, UA: >1000 mg/dL — AB
Leukocytes, UA: NEGATIVE
NITRITE: NEGATIVE
Protein, ur: >300 mg/dL — AB
Specific Gravity, Urine: >1.03 — ABNORMAL HIGH (ref 1.005–1.030)
UROBILINOGEN UA: 0.2 mg/dL (ref 0.0–1.0)
pH: 5.5 (ref 5.0–8.0)

## 2014-02-13 NOTE — Progress Notes (Signed)
I saw and evaluated the patient.  I personally confirmed the key portions of Dr. Komanski's history and exam and reviewed pertinent patient test results.  The assessment, diagnosis, and plan were formulated together and I agree with the documentation in the resident's note. 

## 2014-02-19 ENCOUNTER — Ambulatory Visit: Payer: Self-pay | Admitting: Internal Medicine

## 2014-03-01 ENCOUNTER — Ambulatory Visit (HOSPITAL_COMMUNITY)
Admission: RE | Admit: 2014-03-01 | Discharge: 2014-03-01 | Disposition: A | Discharge status: Home / Self Care | Payer: Medicaid Other | Source: Ambulatory Visit | Attending: Emergency Medicine | Admitting: Emergency Medicine

## 2014-03-01 ENCOUNTER — Emergency Department (HOSPITAL_COMMUNITY)
Admission: EM | Admit: 2014-03-01 | Discharge: 2014-03-01 | Disposition: A | Discharge status: Home / Self Care | Payer: Medicaid Other | Attending: Emergency Medicine | Admitting: Emergency Medicine

## 2014-03-01 ENCOUNTER — Encounter (HOSPITAL_COMMUNITY): Payer: Self-pay | Admitting: Emergency Medicine

## 2014-03-01 DIAGNOSIS — I10 Essential (primary) hypertension: Secondary | ICD-10-CM | POA: Insufficient documentation

## 2014-03-01 DIAGNOSIS — J45909 Unspecified asthma, uncomplicated: Secondary | ICD-10-CM | POA: Insufficient documentation

## 2014-03-01 DIAGNOSIS — E119 Type 2 diabetes mellitus without complications: Secondary | ICD-10-CM | POA: Insufficient documentation

## 2014-03-01 DIAGNOSIS — M79609 Pain in unspecified limb: Secondary | ICD-10-CM

## 2014-03-01 DIAGNOSIS — M7989 Other specified soft tissue disorders: Secondary | ICD-10-CM | POA: Insufficient documentation

## 2014-03-01 DIAGNOSIS — F17200 Nicotine dependence, unspecified, uncomplicated: Secondary | ICD-10-CM | POA: Insufficient documentation

## 2014-03-01 DIAGNOSIS — M25569 Pain in unspecified knee: Secondary | ICD-10-CM

## 2014-03-01 DIAGNOSIS — E669 Obesity, unspecified: Secondary | ICD-10-CM | POA: Insufficient documentation

## 2014-03-01 DIAGNOSIS — Z794 Long term (current) use of insulin: Secondary | ICD-10-CM | POA: Insufficient documentation

## 2014-03-01 MED ORDER — ENOXAPARIN SODIUM 100 MG/ML ~~LOC~~ SOLN
100.0000 mg | Freq: Once | SUBCUTANEOUS | Status: AC
  Administered 2014-03-01: 100 mg via SUBCUTANEOUS
  Filled 2014-03-01: qty 1

## 2014-03-01 NOTE — ED Provider Notes (Signed)
CSN: [id number]     Arrival date & time 03/01/14  0152 History   First MD Initiated Contact with Patient 03/01/14 0408     Chief Complaint  Patient presents with  . Leg Swelling     (Consider location/radiation/quality/duration/timing/severity/associated sxs/prior Treatment) HPI  Patient is an 38 year old woman with a history of diabetes and hypertension. She is obese.  She presents with complaints of bilateral lower extremity edema which isn't present for several days. No recent trauma or strain. The patient says she has a tight feeling in her lower legs which is more significant on the left compared to the right. No pain. No shortness of breath or chest pain. No history or risk factors for venous thromboembolism. A recent medication changes.  Past Medical History  Diagnosis Date  . Asthma   . Diabetes mellitus   . Hyperlipidemia   . Tobacco abuse   . Seasonal allergies    Past Surgical History  Procedure Laterality Date  . Tubal ligation    . Cesarean section     Family History  Problem Relation Age of Onset  . Stroke Mother   . Diabetes Father   . Diabetes Mother   . Diabetes Maternal Grandmother   . Diabetes Maternal Grandfather   . Hypertension Mother   . Hypertension Father   . Cancer Paternal Grandfather     Prostate  . Aneurysm Mother    History  Substance Use Topics  . Smoking status: Current Every Day Smoker -- 0.30 packs/day for 1 years    Types: Cigarettes    Last Attempt to Quit: 09/02/2010  . Smokeless tobacco: Not on file  . Alcohol Use: Yes   OB History   Grav Para Term Preterm Abortions TAB SAB Ect Mult Living                 Review of Systems Ten point review of symptoms performed and is negative with the exception of symptoms noted above.     Allergies  Review of patient's allergies indicates no known allergies.  Home Medications   Prior to Admission medications   Medication Sig Start Date End Date Taking? Authorizing Provider   insulin aspart protamine- aspart (NOVOLOG MIX 70/30) (70-30) 100 UNIT/ML injection Inject 0.25 mLs (25 Units total) into the skin 2 (two) times daily with a meal. 02/09/14  Yes Clinton Gallant, MD  nystatin (MYCOSTATIN/NYSTOP) 100000 UNIT/GM POWD Apply powder to vaginal area up to 2 times a day 02/09/14  Yes Clinton Gallant, MD   BP 160/88  Pulse 106  Temp(Src) 98.2 F (36.8 C) (Oral)  Resp 18  SpO2 100%  LMP 02/18/2014 Physical Exam Gen: well developed and well nourished appearing Head: NCAT Eyes: PERL, EOMI Nose: no epistaixis or rhinorrhea Mouth/throat: mucosa is moist and pink Neck: supple, no stridor Lungs: CTA B, no wheezing, rhonchi or rales CV: RRR, no murmur, extremities appear well perfused.  Abd: soft, notender, nondistended Back: no ttp, no cva ttp Skin: warm and dry Ext: dp PULSES strong and symmetric, cap refill < 2s both feet, pitting edema pretibial region bilaterally - left > right. Calf ttp is present on left.  Neuro: CN ii-xii grossly intact, no focal deficits Psyche; normal affect,  calm and cooperative.   ED Course  Procedures (including critical care time) Labs Review   MDM   Final diagnoses:  Leg swelling   DDX: dependent edema v. VTE. We will tx with Lovenox with plan for patient to return for U/S later this  morning. No sx suggestive of PE. Plan discussed with the patient and she is stable for discharge.     Elyn Peers, MD 03/01/14 [phone]

## 2014-03-01 NOTE — ED Notes (Signed)
Patient with bilateral ankle and leg swelling.  Patient states that she does have swelling normally, but has 3+edema in left ankle, going up left leg to knee.  Patient has 1+edema in right ankle and leg.  She states that she did previously injure her left leg in Nov 2014.  Patient is CAOx3.

## 2014-03-01 NOTE — Discharge Instructions (Signed)
Edema Edema is an abnormal build-up of fluids in tissues. Because this is partly dependent on gravity (water flows to the lowest place), it is more common in the legs and thighs (lower extremities). It is also common in the looser tissues, like around the eyes. Painless swelling of the feet and ankles is common and increases as a person ages. It may affect both legs and may include the calves or even thighs. When squeezed, the fluid may move out of the affected area and may leave a dent for a few moments. CAUSES   Prolonged standing or sitting in one place for extended periods of time. Movement helps pump tissue fluid into the veins, and absence of movement prevents this, resulting in edema.  Varicose veins. The valves in the veins do not work as well as they should. This causes fluid to leak into the tissues.  Fluid and salt overload.  Injury, burn, or surgery to the leg, ankle, or foot, may damage veins and allow fluid to leak out.  Sunburn damages vessels. Leaky vessels allow fluid to go out into the sunburned tissues.  Allergies (from insect bites or stings, medications or chemicals) cause swelling by allowing vessels to become leaky.  Protein in the blood helps keep fluid in your vessels. Low protein, as in malnutrition, allows fluid to leak out.  Hormonal changes, including pregnancy and menstruation, cause fluid retention. This fluid may leak out of vessels and cause edema.  Medications that cause fluid retention. Examples are sex hormones, blood pressure medications, steroid treatment, or anti-depressants.  Some illnesses cause edema, especially heart failure, kidney disease, or liver disease.  Surgery that cuts veins or lymph nodes, such as surgery done for the heart or for breast cancer, may result in edema. DIAGNOSIS  Your caregiver is usually easily able to determine what is causing your swelling (edema) by simply asking what is wrong (getting a history) and examining you (doing  a physical). Sometimes x-rays, EKG (electrocardiogram or heart tracing), and blood work may be done to evaluate for underlying medical illness. TREATMENT  General treatment includes:  Leg elevation (or elevation of the affected body part).  Restriction of fluid intake.  Prevention of fluid overload.  Compression of the affected body part. Compression with elastic bandages or support stockings squeezes the tissues, preventing fluid from entering and forcing it back into the blood vessels.  Diuretics (also called water pills or fluid pills) pull fluid out of your body in the form of increased urination. These are effective in reducing the swelling, but can have side effects and must be used only under your caregiver's supervision. Diuretics are appropriate only for some types of edema. The specific treatment can be directed at any underlying causes discovered. Heart, liver, or kidney disease should be treated appropriately. HOME CARE INSTRUCTIONS   Elevate the legs (or affected body part) above the level of the heart, while lying down.  Avoid sitting or standing still for prolonged periods of time.  Avoid putting anything directly under the knees when lying down, and do not wear constricting clothing or garters on the upper legs.  Exercising the legs causes the fluid to work back into the veins and lymphatic channels. This may help the swelling go down.  The pressure applied by elastic bandages or support stockings can help reduce ankle swelling.  A low-salt diet may help reduce fluid retention and decrease the ankle swelling.  Take any medications exactly as prescribed. SEEK MEDICAL CARE IF:  Your edema is  not responding to recommended treatments. SEEK IMMEDIATE MEDICAL CARE IF:   You develop shortness of breath or chest pain.  You cannot breathe when you lay down; or if, while lying down, you have to get up and go to the window to get your breath.  You are having increasing  swelling without relief from treatment.  You develop a fever over 102 F (38.9 C).  You develop pain or redness in the areas that are swollen.  Tell your caregiver right away if you have gained 03 lb/1.4 kg in 1 day or 05 lb/2.3 kg in a week. MAKE SURE YOU:   Understand these instructions.  Will watch your condition.  Will get help right away if you are not doing well or get worse. Document Released: 10/31/2005 Document Revised: 05/01/2012 Document Reviewed: 06/18/2008 [facility] Patient Information 2014 [place].   PLEASE RETURN TO [place] PATIENT REGISTRATION BEFORE NOON TODAY TO HAVE A VASCULAR ULTRASOUND OF YOUR LEFT LEG.

## 2014-03-01 NOTE — ED Notes (Signed)
MD at bedside. 

## 2014-03-03 ENCOUNTER — Other Ambulatory Visit (HOSPITAL_COMMUNITY): Payer: Self-pay | Admitting: Emergency Medicine

## 2014-03-03 DIAGNOSIS — M25569 Pain in unspecified knee: Secondary | ICD-10-CM

## 2014-03-03 NOTE — Progress Notes (Signed)
VASCULAR LAB PRELIMINARY  PRELIMINARY  PRELIMINARY  PRELIMINARY  Left lower extremity venous Doppler completed.    Preliminary report:  There is no DVT or SVT noted in the left lower extremity.  Iantha Fallen, RVT 03/03/2014, 9:51 AM

## 2014-03-26 ENCOUNTER — Encounter (HOSPITAL_COMMUNITY): Payer: Self-pay | Admitting: Emergency Medicine

## 2014-03-26 ENCOUNTER — Emergency Department (HOSPITAL_COMMUNITY): Payer: Medicaid Other

## 2014-03-26 ENCOUNTER — Emergency Department (HOSPITAL_COMMUNITY)
Admission: EM | Admit: 2014-03-26 | Discharge: 2014-03-26 | Disposition: A | Discharge status: Home / Self Care | Payer: Medicaid Other | Attending: Emergency Medicine | Admitting: Emergency Medicine

## 2014-03-26 DIAGNOSIS — IMO0001 Reserved for inherently not codable concepts without codable children: Secondary | ICD-10-CM | POA: Insufficient documentation

## 2014-03-26 DIAGNOSIS — F17200 Nicotine dependence, unspecified, uncomplicated: Secondary | ICD-10-CM | POA: Insufficient documentation

## 2014-03-26 DIAGNOSIS — J45909 Unspecified asthma, uncomplicated: Secondary | ICD-10-CM | POA: Insufficient documentation

## 2014-03-26 DIAGNOSIS — R5383 Other fatigue: Secondary | ICD-10-CM

## 2014-03-26 DIAGNOSIS — Z794 Long term (current) use of insulin: Secondary | ICD-10-CM | POA: Insufficient documentation

## 2014-03-26 DIAGNOSIS — J069 Acute upper respiratory infection, unspecified: Secondary | ICD-10-CM

## 2014-03-26 DIAGNOSIS — E119 Type 2 diabetes mellitus without complications: Secondary | ICD-10-CM | POA: Insufficient documentation

## 2014-03-26 DIAGNOSIS — R5381 Other malaise: Secondary | ICD-10-CM | POA: Insufficient documentation

## 2014-03-26 MED ORDER — HYDROCODONE-HOMATROPINE 5-1.5 MG/5ML PO SYRP: 5.0000 mL | ORAL_SOLUTION | Freq: Four times a day (QID) | ORAL | Status: DC | PRN

## 2014-03-26 MED ORDER — LORATADINE-PSEUDOEPHEDRINE ER 5-120 MG PO TB12: 1.0000 | ORAL_TABLET | Freq: Two times a day (BID) | ORAL | Status: DC

## 2014-03-26 NOTE — ED Notes (Signed)
Cold s/s congestion aching all over and fever /chills x3 days

## 2014-03-26 NOTE — ED Provider Notes (Signed)
CSN: [id number]     Arrival date & time 03/26/14  1245 History  This chart was scribed for non-physician practitioner working with Tanna Furry, MD, by Erling Conte, ED Scribe. This patient was seen in room TR08C/TR08C and the patient's care was started at 3:04 PM.    Chief Complaint  Patient presents with  . URI    Patient is a 38 y.o. female presenting with URI. The history is provided by the patient. No language interpreter was used.  URI Presenting symptoms: congestion, cough, fatigue and fever (subjective)   Severity:  Mild Onset quality:  Gradual Duration:  5 days Progression:  Worsening Chronicity:  New Relieved by:  Nothing Ineffective treatments: Cold medicine. Associated symptoms: myalgias   Risk factors: diabetes mellitus    HPI Comments: Jane [name] is a 38 y.o. female with a h/o asthma, DM, and seasonal allergies who presents to the Emergency Department complaining of gradually worsening cold like symptoms for 5 days. Patient states she is having an associated cough, fatigue, chest tightness, sinus congestion, generalized body aches, and subjective fever. Patient states she took 2 doses of cold medicine with no relief. Patient is a current everyday smoker and states she smokes 1/3 of a pack daily.   Past Medical History  Diagnosis Date  . Asthma   . Diabetes mellitus   . Hyperlipidemia   . Tobacco abuse   . Seasonal allergies    Past Surgical History  Procedure Laterality Date  . Tubal ligation    . Cesarean section     Family History  Problem Relation Age of Onset  . Stroke Mother   . Diabetes Father   . Diabetes Mother   . Diabetes Maternal Grandmother   . Diabetes Maternal Grandfather   . Hypertension Mother   . Hypertension Father   . Cancer Paternal Grandfather     Prostate  . Aneurysm Mother    History  Substance Use Topics  . Smoking status: Current Every Day Smoker -- 0.30 packs/day for 1 years    Types: Cigarettes    Last Attempt to  Quit: 09/02/2010  . Smokeless tobacco: Not on file  . Alcohol Use: Yes   OB History   Grav Para Term Preterm Abortions TAB SAB Ect Mult Living                 Review of Systems  Constitutional: Positive for fever (subjective) and fatigue.  HENT: Positive for congestion.   Respiratory: Positive for cough and chest tightness.   Musculoskeletal: Positive for myalgias.  All other systems reviewed and are negative.     Allergies  Review of patient's allergies indicates no known allergies.  Home Medications   Prior to Admission medications   Medication Sig Start Date End Date Taking? Authorizing Provider  insulin aspart protamine- aspart (NOVOLOG MIX 70/30) (70-30) 100 UNIT/ML injection Inject 0.25 mLs (25 Units total) into the skin 2 (two) times daily with a meal. 02/09/14  Yes Clinton Gallant, MD   Triage Vitals: BP 153/91  Pulse 94  Temp(Src) 98.3 F (36.8 C)  Resp 16  SpO2 100%  LMP 02/18/2014  Physical Exam  Nursing note and vitals reviewed. Constitutional: She is oriented to person, place, and time. She appears well-developed and well-nourished. No distress.  HENT:  Head: Normocephalic and atraumatic.  Right Ear: External ear normal.  Left Ear: External ear normal.  Nose: Nose normal.  Mouth/Throat: Oropharynx is clear and moist.  Eyes: Conjunctivae are normal.  Neck: Normal  range of motion. Neck supple.  Cardiovascular: Normal rate.   Pulmonary/Chest: Effort normal.  Abdominal: Soft.  Musculoskeletal: Normal range of motion.  Neurological: She is alert and oriented to person, place, and time.  Skin: Skin is warm and dry. She is not diaphoretic.  Psychiatric: She has a normal mood and affect.    ED Course  Procedures (including critical care time)  DIAGNOSTIC STUDIES: Oxygen Saturation is 100% on RA, normal by my interpretation.    COORDINATION OF CARE: 1:19 PM- Will order CXR. Pt advised of plan for treatment and pt agrees.  3:08 PM- Will order and  discharge patient with 12 hr Claritin-D and hycodan syrup. Pt advised of plan for treatment and pt agrees.     Labs Review Labs Reviewed - No data to display  Imaging Review Dg Chest 2 View  03/26/2014   CLINICAL DATA:  rule out pneumonia  EXAM: CHEST  2 VIEW  COMPARISON:  DG CHEST 2 VIEW dated 10/12/2012  FINDINGS: Low lung volumes. The cardiac silhouette is mild-to-moderately enlarged. The lungs are clear. No acute osseous abnormalities.  IMPRESSION: No acute cardiopulmonary disease.  Mild cardiomegaly.   Electronically Signed   By: Margaree Mackintosh M.D.   On: 03/26/2014 14:05     EKG Interpretation None      MDM   Final diagnoses:  URI (upper respiratory infection)    3:11 PM Patient's chest xray unremarkable for acute changes. Patient likely has a viral illness vs seasonal allergies. Patient will be treated with Claritin-D and hycodan. Vitals stable and patient afebrile.   I personally performed the services described in this documentation, which was scribed in my presence. The recorded information has been reviewed and is accurate.     Alvina Chou, PA-C 03/26/14 1512

## 2014-03-26 NOTE — Discharge Instructions (Signed)
Take claritin-d as directed daily. Take hycodan as needed for cough. Refer to attached documents for more information. Follow up with your doctor.

## 2014-03-27 NOTE — ED Provider Notes (Signed)
Medical screening examination/treatment/procedure(s) were performed by non-physician practitioner and as supervising physician I was immediately available for consultation/collaboration.   EKG Interpretation None        Brogen Duell, MD 03/27/14 1341 

## 2014-07-01 ENCOUNTER — Emergency Department (HOSPITAL_COMMUNITY)
Admission: EM | Admit: 2014-07-01 | Discharge: 2014-07-01 | Disposition: A | Discharge status: Home / Self Care | Payer: Medicaid Other | Attending: Emergency Medicine | Admitting: Emergency Medicine

## 2014-07-01 ENCOUNTER — Encounter (HOSPITAL_COMMUNITY): Payer: Self-pay | Admitting: Emergency Medicine

## 2014-07-01 DIAGNOSIS — R42 Dizziness and giddiness: Secondary | ICD-10-CM | POA: Insufficient documentation

## 2014-07-01 DIAGNOSIS — F17200 Nicotine dependence, unspecified, uncomplicated: Secondary | ICD-10-CM | POA: Diagnosis not present

## 2014-07-01 DIAGNOSIS — R110 Nausea: Secondary | ICD-10-CM | POA: Diagnosis not present

## 2014-07-01 DIAGNOSIS — E119 Type 2 diabetes mellitus without complications: Secondary | ICD-10-CM | POA: Insufficient documentation

## 2014-07-01 DIAGNOSIS — Z794 Long term (current) use of insulin: Secondary | ICD-10-CM | POA: Diagnosis not present

## 2014-07-01 DIAGNOSIS — J45909 Unspecified asthma, uncomplicated: Secondary | ICD-10-CM | POA: Insufficient documentation

## 2014-07-01 DIAGNOSIS — Z3202 Encounter for pregnancy test, result negative: Secondary | ICD-10-CM | POA: Insufficient documentation

## 2014-07-01 DIAGNOSIS — R739 Hyperglycemia, unspecified: Secondary | ICD-10-CM

## 2014-07-01 LAB — URINALYSIS, ROUTINE W REFLEX MICROSCOPIC
BILIRUBIN URINE: NEGATIVE
Glucose, UA: >1000 mg/dL — AB
KETONES UR: NEGATIVE mg/dL
Leukocytes, UA: NEGATIVE
NITRITE: NEGATIVE
PROTEIN: 100 mg/dL — AB
Specific Gravity, Urine: 1.026 (ref 1.005–1.030)
UROBILINOGEN UA: 1 mg/dL (ref 0.0–1.0)
pH: 5.5 (ref 5.0–8.0)

## 2014-07-01 LAB — COMPREHENSIVE METABOLIC PANEL
ALBUMIN: 3.2 g/dL — AB (ref 3.5–5.2)
ALK PHOS: 82 U/L (ref 39–117)
ALT: 14 U/L (ref 0–35)
ANION GAP: 14 (ref 5–15)
AST: 16 U/L (ref 0–37)
BILIRUBIN TOTAL: 0.3 mg/dL (ref 0.3–1.2)
BUN: 18 mg/dL (ref 6–23)
CHLORIDE: 97 meq/L (ref 96–112)
CO2: 23 meq/L (ref 19–32)
Calcium: 9.1 mg/dL (ref 8.4–10.5)
Creatinine, Ser: 0.93 mg/dL (ref 0.50–1.10)
GFR calc Af Amer: 89 mL/min — ABNORMAL LOW (ref >90)
GFR, EST NON AFRICAN AMERICAN: 76 mL/min — AB (ref >90)
Glucose, Bld: 480 mg/dL — ABNORMAL HIGH (ref 70–99)
POTASSIUM: 4 meq/L (ref 3.7–5.3)
Sodium: 134 mEq/L — ABNORMAL LOW (ref 137–147)
Total Protein: 6.9 g/dL (ref 6.0–8.3)

## 2014-07-01 LAB — CBC WITH DIFFERENTIAL/PLATELET
BASOS ABS: 0 10*3/uL (ref 0.0–0.1)
BASOS PCT: 0 % (ref 0–1)
EOS ABS: 0.1 10*3/uL (ref 0.0–0.7)
EOS PCT: 3 % (ref 0–5)
HCT: 31.8 % — ABNORMAL LOW (ref 36.0–46.0)
Hemoglobin: 10.2 g/dL — ABNORMAL LOW (ref 12.0–15.0)
Lymphocytes Relative: 37 % (ref 12–46)
Lymphs Abs: 1.7 10*3/uL (ref 0.7–4.0)
MCH: 24.9 pg — ABNORMAL LOW (ref 26.0–34.0)
MCHC: 32.1 g/dL (ref 30.0–36.0)
MCV: 77.6 fL — AB (ref 78.0–100.0)
Monocytes Absolute: 0.3 10*3/uL (ref 0.1–1.0)
Monocytes Relative: 7 % (ref 3–12)
Neutro Abs: 2.5 10*3/uL (ref 1.7–7.7)
Neutrophils Relative %: 53 % (ref 43–77)
PLATELETS: 308 10*3/uL (ref 150–400)
RBC: 4.1 MIL/uL (ref 3.87–5.11)
RDW: 14.5 % (ref 11.5–15.5)
WBC: 4.6 10*3/uL (ref 4.0–10.5)

## 2014-07-01 LAB — I-STAT VENOUS BLOOD GAS, ED
Acid-Base Excess: 1 mmol/L (ref 0.0–2.0)
Bicarbonate: 26.5 mEq/L — ABNORMAL HIGH (ref 20.0–24.0)
O2 Saturation: 73 %
PCO2 VEN: 45.5 mmHg (ref 45.0–50.0)
TCO2: 28 mmol/L (ref 0–100)
pH, Ven: 7.373 — ABNORMAL HIGH (ref 7.250–7.300)
pO2, Ven: 40 mmHg (ref 30.0–45.0)

## 2014-07-01 LAB — POC URINE PREG, ED: Preg Test, Ur: NEGATIVE

## 2014-07-01 LAB — URINE MICROSCOPIC-ADD ON

## 2014-07-01 MED ORDER — SODIUM CHLORIDE 0.9 % IV BOLUS (SEPSIS)
1000.0000 mL | Freq: Once | INTRAVENOUS | Status: AC
  Administered 2014-07-01: 1000 mL via INTRAVENOUS

## 2014-07-01 NOTE — ED Notes (Signed)
Pt asked to provide urine sample  

## 2014-07-01 NOTE — ED Notes (Signed)
Pt sts intermittent dizziness with nausea and some diarrhea x 3 months; pt sts body aches started yesterday; pt sts hx of DM and does not typically check her CBG

## 2014-07-01 NOTE — ED Provider Notes (Signed)
CSN: [id number]     Arrival date & time 07/01/14  1150 History   First MD Initiated Contact with Patient 07/01/14 1203     Chief Complaint  Patient presents with  . Dizziness  . Nausea      HPI  Patient presents with concern nausea, anorexia, and diffuse generalized discomfort. Symptoms began approximately 2 weeks ago, had been persistent, without clear alleviating, exacerbating factors. The discomfort is diffuse, without focal pain. There is no associated fever, headache, confusion, disorientation, visual changes, incontinence, dysuria, polyuria. Patient states that she takes her insulin as directed, but does not check her blood glucose.   Past Medical History  Diagnosis Date  . Asthma   . Diabetes mellitus   . Hyperlipidemia   . Tobacco abuse   . Seasonal allergies    Past Surgical History  Procedure Laterality Date  . Tubal ligation    . Cesarean section     Family History  Problem Relation Age of Onset  . Stroke Mother   . Diabetes Father   . Diabetes Mother   . Diabetes Maternal Grandmother   . Diabetes Maternal Grandfather   . Hypertension Mother   . Hypertension Father   . Cancer Paternal Grandfather     Prostate  . Aneurysm Mother    History  Substance Use Topics  . Smoking status: Current Every Day Smoker -- 0.30 packs/day for 1 years    Types: Cigarettes    Last Attempt to Quit: 09/02/2010  . Smokeless tobacco: Not on file  . Alcohol Use: Yes   OB History   Grav Para Term Preterm Abortions TAB SAB Ect Mult Living                 Review of Systems  Constitutional:       Per HPI, otherwise negative  HENT:       Per HPI, otherwise negative  Respiratory:       Per HPI, otherwise negative  Cardiovascular:       Per HPI, otherwise negative  Gastrointestinal: Negative for vomiting.  Endocrine:       Negative aside from HPI  Genitourinary:       Neg aside from HPI   Musculoskeletal:       Per HPI, otherwise negative  Skin: Negative.    Neurological: Negative for syncope.      Allergies  Review of patient's allergies indicates no known allergies.  Home Medications   Prior to Admission medications   Medication Sig Start Date End Date Taking? Authorizing Provider  insulin aspart protamine- aspart (NOVOLOG MIX 70/30) (70-30) 100 UNIT/ML injection Inject 0.25 mLs (25 Units total) into the skin 2 (two) times daily with a meal. 02/09/14  Yes Clinton Gallant, MD   BP 130/72  Pulse 93  Temp(Src) 97.7 F (36.5 C) (Oral)  Resp 18  SpO2 99% Physical Exam  Nursing note and vitals reviewed. Constitutional: She is oriented to person, place, and time. She appears well-developed and well-nourished. No distress.  HENT:  Head: Normocephalic and atraumatic.  Eyes: Conjunctivae and EOM are normal.  Cardiovascular: Normal rate and regular rhythm.   Pulmonary/Chest: Effort normal and breath sounds normal. No stridor. No respiratory distress.  Abdominal: She exhibits no distension. There is no tenderness.  Musculoskeletal: She exhibits no edema.  Neurological: She is alert and oriented to person, place, and time. No cranial nerve deficit.  Skin: Skin is warm and dry.  Psychiatric: She has a normal mood and affect.  ED Course  Procedures    Labs Review Labs Reviewed  COMPREHENSIVE METABOLIC PANEL - Abnormal; Notable for the following:    Sodium 134 (*)    Glucose, Bld 480 (*)    Albumin 3.2 (*)    GFR calc non Af Amer 76 (*)    GFR calc Af Amer 89 (*)    All other components within normal limits  CBC WITH DIFFERENTIAL - Abnormal; Notable for the following:    Hemoglobin 10.2 (*)    HCT 31.8 (*)    MCV 77.6 (*)    MCH 24.9 (*)    All other components within normal limits  URINALYSIS, ROUTINE W REFLEX MICROSCOPIC - Abnormal; Notable for the following:    Glucose, UA >1000 (*)    Hgb urine dipstick SMALL (*)    Protein, ur 100 (*)    All other components within normal limits  I-STAT VENOUS BLOOD GAS, ED - Abnormal;  Notable for the following:    pH, Ven 7.373 (*)    Bicarbonate 26.5 (*)    All other components within normal limits  URINE MICROSCOPIC-ADD ON  POC URINE PREG, ED   Patient's labs notable for hyperglycemia, no anion gap.  2:37 PM Patient asleep, clearly in no distress MDM  Patient presents with generalized planes. Patient is insulin-dependent diabetic, and has hyperglycemia, but no anion gap, no evidence of distress, no acidosis. Patient improved with IV fluids here, was discharged in stable condition to follow up with primary care.     Carmin Muskrat, MD 07/01/14 1438

## 2014-07-01 NOTE — Discharge Instructions (Signed)
Your evaluation today has been largely reassuring.  But, it is important that you monitor your condition carefully, and do not hesitate to return to the ED if you develop new, or concerning changes in your condition. ° °Otherwise, please follow-up with your physician for appropriate ongoing care. ° °

## 2014-07-01 NOTE — ED Notes (Signed)
Pt states she feels a little better.

## 2014-07-29 ENCOUNTER — Encounter (HOSPITAL_COMMUNITY): Payer: Self-pay | Admitting: Emergency Medicine

## 2014-07-29 DIAGNOSIS — F17200 Nicotine dependence, unspecified, uncomplicated: Secondary | ICD-10-CM | POA: Insufficient documentation

## 2014-07-29 DIAGNOSIS — Z794 Long term (current) use of insulin: Secondary | ICD-10-CM | POA: Insufficient documentation

## 2014-07-29 DIAGNOSIS — E119 Type 2 diabetes mellitus without complications: Secondary | ICD-10-CM | POA: Insufficient documentation

## 2014-07-29 DIAGNOSIS — R5381 Other malaise: Secondary | ICD-10-CM | POA: Diagnosis not present

## 2014-07-29 DIAGNOSIS — R51 Headache: Secondary | ICD-10-CM | POA: Diagnosis present

## 2014-07-29 DIAGNOSIS — R5383 Other fatigue: Secondary | ICD-10-CM | POA: Diagnosis not present

## 2014-07-29 DIAGNOSIS — J45909 Unspecified asthma, uncomplicated: Secondary | ICD-10-CM | POA: Insufficient documentation

## 2014-07-29 DIAGNOSIS — E860 Dehydration: Secondary | ICD-10-CM | POA: Insufficient documentation

## 2014-07-29 NOTE — ED Notes (Signed)
Pt reports 7/10 HA, nausea and lightheadedness x 3 weeks. States she was seen for same appx 2 weeks ago and told it was dehydration. Pt denies taking anything for pain. PT denies abdominal pain or emesis. Pt in NAD

## 2014-07-30 ENCOUNTER — Emergency Department (HOSPITAL_COMMUNITY)
Admission: EM | Admit: 2014-07-30 | Discharge: 2014-07-30 | Disposition: A | Discharge status: Home / Self Care | Payer: Medicaid Other | Attending: Emergency Medicine | Admitting: Emergency Medicine

## 2014-07-30 DIAGNOSIS — R531 Weakness: Secondary | ICD-10-CM

## 2014-07-30 DIAGNOSIS — R739 Hyperglycemia, unspecified: Secondary | ICD-10-CM

## 2014-07-30 DIAGNOSIS — E860 Dehydration: Secondary | ICD-10-CM

## 2014-07-30 LAB — CBC WITH DIFFERENTIAL/PLATELET
BASOS ABS: 0 10*3/uL (ref 0.0–0.1)
Basophils Relative: 0 % (ref 0–1)
Eosinophils Absolute: 0.2 10*3/uL (ref 0.0–0.7)
Eosinophils Relative: 4 % (ref 0–5)
HCT: 31.4 % — ABNORMAL LOW (ref 36.0–46.0)
Hemoglobin: 10.1 g/dL — ABNORMAL LOW (ref 12.0–15.0)
LYMPHS ABS: 2.3 10*3/uL (ref 0.7–4.0)
LYMPHS PCT: 41 % (ref 12–46)
MCH: 25.2 pg — ABNORMAL LOW (ref 26.0–34.0)
MCHC: 32.2 g/dL (ref 30.0–36.0)
MCV: 78.3 fL (ref 78.0–100.0)
MONO ABS: 0.5 10*3/uL (ref 0.1–1.0)
Monocytes Relative: 9 % (ref 3–12)
Neutro Abs: 2.7 10*3/uL (ref 1.7–7.7)
Neutrophils Relative %: 46 % (ref 43–77)
Platelets: 303 10*3/uL (ref 150–400)
RBC: 4.01 MIL/uL (ref 3.87–5.11)
RDW: 13.4 % (ref 11.5–15.5)
WBC: 5.7 10*3/uL (ref 4.0–10.5)

## 2014-07-30 LAB — BASIC METABOLIC PANEL
Anion gap: 11 (ref 5–15)
BUN: 11 mg/dL (ref 6–23)
CALCIUM: 9 mg/dL (ref 8.4–10.5)
CO2: 27 meq/L (ref 19–32)
Chloride: 99 mEq/L (ref 96–112)
Creatinine, Ser: 0.84 mg/dL (ref 0.50–1.10)
GFR calc Af Amer: >90 mL/min (ref >90)
GFR calc non Af Amer: 86 mL/min — ABNORMAL LOW (ref >90)
GLUCOSE: 270 mg/dL — AB (ref 70–99)
Potassium: 3.5 mEq/L — ABNORMAL LOW (ref 3.7–5.3)
Sodium: 137 mEq/L (ref 137–147)

## 2014-07-30 LAB — TROPONIN I: Troponin I: <0.3 ng/mL (ref <0.30)

## 2014-07-30 LAB — CBG MONITORING, ED: Glucose-Capillary: 276 mg/dL — ABNORMAL HIGH (ref 70–99)

## 2014-07-30 MED ORDER — SODIUM CHLORIDE 0.9 % IV BOLUS (SEPSIS)
1000.0000 mL | Freq: Once | INTRAVENOUS | Status: AC
  Administered 2014-07-30: 1000 mL via INTRAVENOUS

## 2014-07-30 NOTE — ED Notes (Signed)
Pt stated she does not have to urinate at this time.

## 2014-07-30 NOTE — Discharge Instructions (Signed)
We saw you in the ER for the weakness and dehydration. All the results in the ER are normal, labs and imaging. We are not sure what is causing your symptoms. The workup in the ER is not complete, and is limited to screening for life threatening and emergent conditions only, so please see a primary care doctor for further evaluation.   Dehydration, Adult Dehydration is when you lose more fluids from the body than you take in. Vital organs like the kidneys, brain, and heart cannot function without a proper amount of fluids and salt. Any loss of fluids from the body can cause dehydration.  CAUSES   Vomiting.  Diarrhea.  Excessive sweating.  Excessive urine output.  Fever. SYMPTOMS  Mild dehydration  Thirst.  Dry lips.  Slightly dry mouth. Moderate dehydration  Very dry mouth.  Sunken eyes.  Skin does not bounce back quickly when lightly pinched and released.  Dark urine and decreased urine production.  Decreased tear production.  Headache. Severe dehydration  Very dry mouth.  Extreme thirst.  Rapid, weak pulse (more than 100 beats per minute at rest).  Cold hands and feet.  Not able to sweat in spite of heat and temperature.  Rapid breathing.  Blue lips.  Confusion and lethargy.  Difficulty being awakened.  Minimal urine production.  No tears. DIAGNOSIS  Your caregiver will diagnose dehydration based on your symptoms and your exam. Blood and urine tests will help confirm the diagnosis. The diagnostic evaluation should also identify the cause of dehydration. TREATMENT  Treatment of mild or moderate dehydration can often be done at home by increasing the amount of fluids that you drink. It is best to drink small amounts of fluid more often. Drinking too much at one time can make vomiting worse. Refer to the home care instructions below. Severe dehydration needs to be treated at the hospital where you will probably be given intravenous (IV) fluids that  contain water and electrolytes. HOME CARE INSTRUCTIONS   Ask your caregiver about specific rehydration instructions.  Drink enough fluids to keep your urine clear or pale yellow.  Drink small amounts frequently if you have nausea and vomiting.  Eat as you normally do.  Avoid:  Foods or drinks high in sugar.  Carbonated drinks.  Juice.  Extremely hot or cold fluids.  Drinks with caffeine.  Fatty, greasy foods.  Alcohol.  Tobacco.  Overeating.  Gelatin desserts.  Wash your hands well to avoid spreading bacteria and viruses.  Only take over-the-counter or prescription medicines for pain, discomfort, or fever as directed by your caregiver.  Ask your caregiver if you should continue all prescribed and over-the-counter medicines.  Keep all follow-up appointments with your caregiver. SEEK MEDICAL CARE IF:  You have abdominal pain and it increases or stays in one area (localizes).  You have a rash, stiff neck, or severe headache.  You are irritable, sleepy, or difficult to awaken.  You are weak, dizzy, or extremely thirsty. SEEK IMMEDIATE MEDICAL CARE IF:   You are unable to keep fluids down or you get worse despite treatment.  You have frequent episodes of vomiting or diarrhea.  You have blood or green matter (bile) in your vomit.  You have blood in your stool or your stool looks black and tarry.  You have not urinated in 6 to 8 hours, or you have only urinated a small amount of very dark urine.  You have a fever.  You faint. MAKE SURE YOU:   Understand these instructions.  Will watch your condition.  Will get help right away if you are not doing well or get worse. Document Released: 10/31/2005 Document Revised: 01/23/2012 Document Reviewed: 06/20/2011 [facility] Patient Information 2015 [place], [place]. This information is not intended to replace advice given to you by your health care provider. Make sure you discuss any questions you have with your  health care provider.  Hyperglycemia Hyperglycemia occurs when the glucose (sugar) in your blood is too high. Hyperglycemia can happen for many reasons, but it most often happens to people who do not know they have diabetes or are not managing their diabetes properly.  CAUSES  Whether you have diabetes or not, there are other causes of hyperglycemia. Hyperglycemia can occur when you have diabetes, but it can also occur in other situations that you might not be as aware of, such as: Diabetes  If you have diabetes and are having problems controlling your blood glucose, hyperglycemia could occur because of some of the following reasons:  Not following your meal plan.  Not taking your diabetes medications or not taking it properly.  Exercising less or doing less activity than you normally do.  Being sick. Pre-diabetes  This cannot be ignored. Before people develop Type 2 diabetes, they almost always have "pre-diabetes." This is when your blood glucose levels are higher than normal, but not yet high enough to be diagnosed as diabetes. Research has shown that some long-term damage to the body, especially the heart and circulatory system, may already be occurring during pre-diabetes. If you take action to manage your blood glucose when you have pre-diabetes, you may delay or prevent Type 2 diabetes from developing. Stress  If you have diabetes, you may be "diet" controlled or on oral medications or insulin to control your diabetes. However, you may find that your blood glucose is higher than usual in the hospital whether you have diabetes or not. This is often referred to as "stress hyperglycemia." Stress can elevate your blood glucose. This happens because of hormones put out by the body during times of stress. If stress has been the cause of your high blood glucose, it can be followed regularly by your caregiver. That way he/she can make sure your hyperglycemia does not continue to get worse or  progress to diabetes. Steroids  Steroids are medications that act on the infection fighting system (immune system) to block inflammation or infection. One side effect can be a rise in blood glucose. Most people can produce enough extra insulin to allow for this rise, but for those who cannot, steroids make blood glucose levels go even higher. It is not unusual for steroid treatments to "uncover" diabetes that is developing. It is not always possible to determine if the hyperglycemia will go away after the steroids are stopped. A special blood test called an A1c is sometimes done to determine if your blood glucose was elevated before the steroids were started. SYMPTOMS  Thirsty.  Frequent urination.  Dry mouth.  Blurred vision.  Tired or fatigue.  Weakness.  Sleepy.  Tingling in feet or leg. DIAGNOSIS  Diagnosis is made by monitoring blood glucose in one or all of the following ways:  A1c test. This is a chemical found in your blood.  Fingerstick blood glucose monitoring.  Laboratory results. TREATMENT  First, knowing the cause of the hyperglycemia is important before the hyperglycemia can be treated. Treatment may include, but is not be limited to:  Education.  Change or adjustment in medications.  Change or adjustment in  meal plan.  Treatment for an illness, infection, etc.  More frequent blood glucose monitoring.  Change in exercise plan.  Decreasing or stopping steroids.  Lifestyle changes. HOME CARE INSTRUCTIONS   Test your blood glucose as directed.  Exercise regularly. Your caregiver will give you instructions about exercise. Pre-diabetes or diabetes which comes on with stress is helped by exercising.  Eat wholesome, balanced meals. Eat often and at regular, fixed times. Your caregiver or nutritionist will give you a meal plan to guide your sugar intake.  Being at an ideal weight is important. If needed, losing as little as 10 to 15 pounds may help improve  blood glucose levels. SEEK MEDICAL CARE IF:   You have questions about medicine, activity, or diet.  You continue to have symptoms (problems such as increased thirst, urination, or weight gain). SEEK IMMEDIATE MEDICAL CARE IF:   You are vomiting or have diarrhea.  Your breath smells fruity.  You are breathing faster or slower.  You are very sleepy or incoherent.  You have numbness, tingling, or pain in your feet or hands.  You have chest pain.  Your symptoms get worse even though you have been following your caregiver's orders.  If you have any other questions or concerns. Document Released: 04/26/2001 Document Revised: 01/23/2012 Document Reviewed: 02/27/2012 [facility] Patient Information 2015 [place], [place]. This information is not intended to replace advice given to you by your health care provider. Make sure you discuss any questions you have with your health care provider.

## 2014-07-30 NOTE — ED Provider Notes (Signed)
CSN: [id number]     Arrival date & time 07/29/14  1953 History   First MD Initiated Contact with Patient 07/30/14 0102     Chief Complaint  Patient presents with  . Headache  . Nausea     (Consider location/radiation/quality/duration/timing/severity/associated sxs/prior Treatment) HPI Comments: Pt comes in with cc of weakness, headaches, lightheadedness. Nausea. Pt has IDDM hx. Reports that for the past 2 weeks, she has had episodes of feeling dizzy, lightheaded. She states that she notices her symptoms when she has nor been hydrating well, and post walking for 15 minutes or strenuous activity. There is nausea, no emesis. Pt denies fevers, chills, chest pains, shortness of breath, abdominal pain, uti like symptoms. Pt has generalized weakness. She admits to not taking her meds as prescribed. Pt has insurance, but no PCP.   Patient is a 38 y.o. female presenting with headaches. The history is provided by the patient.  Headache Associated symptoms: dizziness, fatigue and nausea   Associated symptoms: no abdominal pain, no cough, no diarrhea, no fever, no neck pain and no vomiting     Past Medical History  Diagnosis Date  . Asthma   . Diabetes mellitus   . Hyperlipidemia   . Tobacco abuse   . Seasonal allergies    Past Surgical History  Procedure Laterality Date  . Tubal ligation    . Cesarean section     Family History  Problem Relation Age of Onset  . Stroke Mother   . Diabetes Father   . Diabetes Mother   . Diabetes Maternal Grandmother   . Diabetes Maternal Grandfather   . Hypertension Mother   . Hypertension Father   . Cancer Paternal Grandfather     Prostate  . Aneurysm Mother    History  Substance Use Topics  . Smoking status: Current Every Day Smoker -- 0.30 packs/day for 1 years    Types: Cigarettes    Last Attempt to Quit: 09/02/2010  . Smokeless tobacco: Not on file  . Alcohol Use: Yes   OB History   Grav Para Term Preterm Abortions TAB SAB Ect Mult  Living                 Review of Systems  Constitutional: Positive for appetite change and fatigue. Negative for fever, chills, diaphoresis, activity change and unexpected weight change.  HENT: Negative for facial swelling.   Eyes: Negative for visual disturbance.  Respiratory: Negative for cough, shortness of breath and wheezing.   Cardiovascular: Negative for chest pain.  Gastrointestinal: Positive for nausea. Negative for vomiting, abdominal pain, diarrhea, constipation, blood in stool and abdominal distention.  Endocrine: Negative for polydipsia, polyphagia and polyuria.  Genitourinary: Negative for hematuria, flank pain and difficulty urinating.  Musculoskeletal: Negative for neck pain.  Skin: Negative for color change.  Allergic/Immunologic: Negative for immunocompromised state.  Neurological: Positive for dizziness, weakness and light-headedness. Negative for speech difficulty and headaches.  Hematological: Does not bruise/bleed easily.  Psychiatric/Behavioral: Negative for confusion.      Allergies  Review of patient's allergies indicates no known allergies.  Home Medications   Prior to Admission medications   Medication Sig Start Date End Date Taking? Authorizing Provider  insulin aspart protamine- aspart (NOVOLOG MIX 70/30) (70-30) 100 UNIT/ML injection Inject 25 Units into the skin 2 (two) times daily with a meal.   Yes Historical Provider, MD   BP 104/50  Pulse 82  Temp(Src) 98.3 F (36.8 C) (Oral)  Resp 17  SpO2 97%  LMP 07/21/2014  Physical Exam  Nursing note and vitals reviewed. Constitutional: She is oriented to person, place, and time. She appears well-developed and well-nourished.  HENT:  Head: Normocephalic and atraumatic.  Eyes: EOM are normal. Pupils are equal, round, and reactive to light.  Neck: Neck supple.  Cardiovascular: Normal rate, regular rhythm and normal heart sounds.   No murmur heard. Pulmonary/Chest: Effort normal. No respiratory  distress.  Abdominal: Soft. She exhibits no distension. There is no tenderness. There is no rebound and no guarding.  Neurological: She is alert and oriented to person, place, and time.  Skin: Skin is warm and dry.    ED Course  Procedures (including critical care time) Labs Review Labs Reviewed  CBC WITH DIFFERENTIAL - Abnormal; Notable for the following:    Hemoglobin 10.1 (*)    HCT 31.4 (*)    MCH 25.2 (*)    All other components within normal limits  BASIC METABOLIC PANEL - Abnormal; Notable for the following:    Potassium 3.5 (*)    Glucose, Bld 270 (*)    GFR calc non Af Amer 86 (*)    All other components within normal limits  CBG MONITORING, ED - Abnormal; Notable for the following:    Glucose-Capillary 276 (*)    All other components within normal limits  TROPONIN I  URINALYSIS, ROUTINE W REFLEX MICROSCOPIC  POC URINE PREG, ED    Imaging Review No results found.   EKG Interpretation None      MDM   Final diagnoses:  Hyperglycemia without ketosis  Generalized weakness  Dehydration   Pt comes in with generalized weakness, nausea, headaches, dehydration, dizziness. All symptoms present for a few days now. Last ER visit for same complains. Exam is non focal. Labs shows hyperglycemia w/o Anion Gap, thus no DKA. 0 SIRs criteria.  Pt likely has no acute/emergent problem. We have given her some fluids here. I have asked that she has PCP team as soon as possible.     Varney Biles, MD 07/30/14 [phone]

## 2014-08-05 ENCOUNTER — Encounter: Payer: Self-pay | Admitting: Internal Medicine

## 2014-08-05 ENCOUNTER — Ambulatory Visit (INDEPENDENT_AMBULATORY_CARE_PROVIDER_SITE_OTHER): Payer: Medicaid Other | Admitting: Internal Medicine

## 2014-08-05 VITALS — BP 124/76 | HR 93 | Temp 98.4°F | Ht 65.0 in | Wt 210.2 lb

## 2014-08-05 DIAGNOSIS — E1165 Type 2 diabetes mellitus with hyperglycemia: Secondary | ICD-10-CM

## 2014-08-05 DIAGNOSIS — IMO0002 Reserved for concepts with insufficient information to code with codable children: Secondary | ICD-10-CM

## 2014-08-05 DIAGNOSIS — J069 Acute upper respiratory infection, unspecified: Secondary | ICD-10-CM | POA: Insufficient documentation

## 2014-08-05 DIAGNOSIS — D509 Iron deficiency anemia, unspecified: Secondary | ICD-10-CM

## 2014-08-05 DIAGNOSIS — B9789 Other viral agents as the cause of diseases classified elsewhere: Secondary | ICD-10-CM

## 2014-08-05 DIAGNOSIS — J399 Disease of upper respiratory tract, unspecified: Secondary | ICD-10-CM

## 2014-08-05 DIAGNOSIS — Z23 Encounter for immunization: Secondary | ICD-10-CM

## 2014-08-05 DIAGNOSIS — J398 Other specified diseases of upper respiratory tract: Secondary | ICD-10-CM

## 2014-08-05 DIAGNOSIS — E118 Type 2 diabetes mellitus with unspecified complications: Secondary | ICD-10-CM

## 2014-08-05 DIAGNOSIS — J45909 Unspecified asthma, uncomplicated: Secondary | ICD-10-CM

## 2014-08-05 LAB — POCT GLYCOSYLATED HEMOGLOBIN (HGB A1C): Hemoglobin A1C: >14

## 2014-08-05 LAB — GLUCOSE, CAPILLARY: Glucose-Capillary: 310 mg/dL — ABNORMAL HIGH (ref 70–99)

## 2014-08-05 MED ORDER — BENZONATATE 100 MG PO CAPS: 100.0000 mg | ORAL_CAPSULE | Freq: Three times a day (TID) | ORAL | Status: DC | PRN

## 2014-08-05 MED ORDER — ALBUTEROL SULFATE HFA 108 (90 BASE) MCG/ACT IN AERS: 2.0000 | INHALATION_SPRAY | Freq: Four times a day (QID) | RESPIRATORY_TRACT | Status: DC | PRN

## 2014-08-05 MED ORDER — FERROUS SULFATE 325 (65 FE) MG PO TABS: 325.0000 mg | ORAL_TABLET | Freq: Three times a day (TID) | ORAL | Status: DC

## 2014-08-05 MED ORDER — LORATADINE 10 MG PO TABS: 10.0000 mg | ORAL_TABLET | Freq: Every day | ORAL | Status: DC

## 2014-08-05 MED ORDER — DOCUSATE SODIUM 100 MG PO CAPS: 100.0000 mg | ORAL_CAPSULE | Freq: Two times a day (BID) | ORAL | Status: DC

## 2014-08-05 NOTE — Progress Notes (Signed)
   Subjective:    Patient ID: [name], female    DOB: 1976/08/09, 38 y.o.   MRN: [id number]  URI  Associated symptoms include congestion, coughing, rhinorrhea and a sore throat. Pertinent negatives include no abdominal pain, chest pain, dysuria, ear pain or wheezing.  Cough Associated symptoms include rhinorrhea and a sore throat. Pertinent negatives include no chest pain, chills, ear pain, fever, myalgias, postnasal drip, shortness of breath or wheezing.   Ms. Litwak is a 37 year old woman with PMH of HLP, asthma, DM2 uncontrolled, iron deficiency anemia, presenting for ED follow up visit for hyperglycemia and dehydration with BS of 270 on 9/16. She had not been drinking enough liquids and starting feeling malaise/fatigue that days. She was treated with IV fluids and was discharged home.  For the past few days she has been feeling better with oral hydration. She tells me that it gets really hot and cold at her work place (and [employer] and [place]) and she is not able to hydrate herself well with only a few scheduled potty breaks.   She as a cough with nasal congestion that started Thursday night. It is mostly a dry cough with scant yellow sputum. She has itchy watery eyes and clear/yellow nasal discharge with no sinus pressure/tendnerness.      Review of Systems  Constitutional: Negative for fever, chills, diaphoresis, activity change, appetite change and fatigue.  HENT: Positive for congestion, rhinorrhea and sore throat. Negative for ear pain and postnasal drip.   Eyes: Positive for itching. Negative for discharge.  Respiratory: Positive for cough. Negative for shortness of breath and wheezing.   Cardiovascular: Negative for chest pain.  Gastrointestinal: Negative for abdominal pain.  Genitourinary: Negative for dysuria and frequency.  Musculoskeletal: Negative for myalgias.  Neurological: Negative for dizziness, weakness and light-headedness.  Psychiatric/Behavioral:  Negative for agitation.       Objective:   Physical Exam  Nursing note and vitals reviewed. Constitutional: She is oriented to person, place, and time. She appears well-developed and well-nourished. No distress.  HENT:  Mouth/Throat: Oropharynx is clear and moist. No oropharyngeal exudate.  Pale nasal turbinates bilaterally   Eyes:  Red conjunctivae bilaterally with no discharge.   Cardiovascular: Normal rate and regular rhythm.   Pulmonary/Chest: Effort normal and breath sounds normal. No respiratory distress. She has no wheezes. She has no rales.  Musculoskeletal: She exhibits no edema and no tenderness.  Neurological: She is alert and oriented to person, place, and time.  Skin: Skin is warm and dry. She is not diaphoretic.  Psychiatric: She has a normal mood and affect.          Assessment & Plan:

## 2014-08-05 NOTE — Assessment & Plan Note (Signed)
No recent flare ups but will Rx albuterol rescue inhaler

## 2014-08-05 NOTE — Patient Instructions (Signed)
-  Start taking Claritin 10mg  daily for your allergies.  -Take Tessalon perls as needed for your cough.  -Start taking iron tablets for your anemia. You may take colace 100mg  twice per day to prevent constipation while taking iron.  -Start using Nolovog 70/30 twice per day and check your blood sugars three times per day.  -Follow up with Dr. Naaman Plummer on October 2nd.    Please bring your medicines with you each time you come.   Medicines may be  Eye drops  Herbal   Vitamins  Pills  Seeing these help [place] take care of you.

## 2014-08-05 NOTE — Assessment & Plan Note (Signed)
She received the flu vaccine today She is due for a Pap smear and the pneumonia vaccine

## 2014-08-05 NOTE — Assessment & Plan Note (Addendum)
Lab Results  Component Value Date   HGBA1C >14.0 08/05/2014   HGBA1C 14.9* 02/09/2014   HGBA1C >14.0 10/22/2013     Assessment: Diabetes control:  Not controlled Progress toward A1C goal:   Not at goal Comments: She is prescribed Novolog 70/30 25 units BID ac but she is taking only 20 units once daily as she forgets to check her BS and use insulin. She does not have trouble affording her medication or diabetic supplies and wants to do better, she has cut down on sodas and has improved her diet.   Plan: Medications:  continue current medications Home glucose monitoring: Frequency:   Timing:   Instruction/counseling given: reminded to bring blood glucose meter & log to each visit Educational resources provided:   Self management tools provided:   Other plans: She will follow up with her new PCP, Dr. Naaman Plummer on October 2nd to further discuss her diabetes. She declined referral to see [facility]. Checked her urine microalb/Cr which was elevated in the past, she tells me she used to be on lisinopril for this but is no longer on this medication. Provided a letter for her to give to her employer explaining that she may need more frequent bathroom breaks to allow her to increased her oral hydration.

## 2014-08-05 NOTE — Assessment & Plan Note (Addendum)
She has not been taking her iron supplementation.  Restart ferrous sulfate 325mg  TID ac with colace May need referral back to GYN--will discuss this during her next visit.

## 2014-08-05 NOTE — Assessment & Plan Note (Signed)
Likely due to her seasonal allergies.  Rx Claritin 10mg  daily Tessalon perls for cough Saline nasal spray Encouraged oral hydration with water

## 2014-08-06 LAB — MICROALBUMIN / CREATININE URINE RATIO
CREATININE, URINE: 123.4 mg/dL
MICROALB UR: 40.6 mg/dL — AB (ref <2.0)
MICROALB/CREAT RATIO: 329 mg/g — AB (ref 0.0–30.0)

## 2014-08-06 NOTE — Progress Notes (Signed)
Case discussed with Dr. Kennerly soon after the resident saw the patient.  We reviewed the resident's history and exam and pertinent patient test results.  I agree with the assessment, diagnosis, and plan of care documented in the resident's note. 

## 2014-08-15 ENCOUNTER — Ambulatory Visit (INDEPENDENT_AMBULATORY_CARE_PROVIDER_SITE_OTHER): Payer: Self-pay | Admitting: Internal Medicine

## 2014-08-15 VITALS — BP 141/80 | HR 99 | Temp 98.5°F | Wt 213.8 lb

## 2014-08-15 DIAGNOSIS — Z23 Encounter for immunization: Secondary | ICD-10-CM

## 2014-08-15 DIAGNOSIS — J302 Other seasonal allergic rhinitis: Secondary | ICD-10-CM

## 2014-08-15 DIAGNOSIS — IMO0002 Reserved for concepts with insufficient information to code with codable children: Secondary | ICD-10-CM

## 2014-08-15 DIAGNOSIS — D509 Iron deficiency anemia, unspecified: Secondary | ICD-10-CM

## 2014-08-15 DIAGNOSIS — E11339 Type 2 diabetes mellitus with moderate nonproliferative diabetic retinopathy without macular edema: Secondary | ICD-10-CM

## 2014-08-15 DIAGNOSIS — E113399 Type 2 diabetes mellitus with moderate nonproliferative diabetic retinopathy without macular edema, unspecified eye: Secondary | ICD-10-CM

## 2014-08-15 DIAGNOSIS — E1165 Type 2 diabetes mellitus with hyperglycemia: Secondary | ICD-10-CM

## 2014-08-15 DIAGNOSIS — E785 Hyperlipidemia, unspecified: Secondary | ICD-10-CM

## 2014-08-15 DIAGNOSIS — Z0000 Encounter for general adult medical examination without abnormal findings: Secondary | ICD-10-CM

## 2014-08-15 DIAGNOSIS — I10 Essential (primary) hypertension: Secondary | ICD-10-CM

## 2014-08-15 MED ORDER — METFORMIN HCL 500 MG PO TABS: 500.0000 mg | ORAL_TABLET | Freq: Two times a day (BID) | ORAL | Status: DC

## 2014-08-15 MED ORDER — PRAVASTATIN SODIUM 40 MG PO TABS: 40.0000 mg | ORAL_TABLET | Freq: Every day | ORAL | Status: DC

## 2014-08-15 MED ORDER — CETIRIZINE HCL 10 MG PO TABS: 10.0000 mg | ORAL_TABLET | Freq: Every day | ORAL | Status: DC

## 2014-08-15 MED ORDER — LISINOPRIL 5 MG PO TABS: 5.0000 mg | ORAL_TABLET | Freq: Every day | ORAL | Status: DC

## 2014-08-15 MED ORDER — FLUTICASONE PROPIONATE 50 MCG/ACT NA SUSP: 2.0000 | Freq: Every day | NASAL | Status: DC

## 2014-08-15 NOTE — Progress Notes (Signed)
Patient ID: Jane [name], female   DOB: 02/05/1976, 38 y.o.   MRN: [id number]    Subjective:   Patient ID: Jane [name] female   DOB: 1975/12/26 38 y.o.   MRN: [id number]  HPI: [name] is a 38 y.o. pleasant woman with past medical history of uncontrolled Type II DM, asthma, hyperlipidemia, iron-deficiency anemia, and allergic rhinitis who presents for routine follow-up visit.    Her last A1c was >14 at last visit on 08/05/14. She reports that she has changed her diet since last visit and has cut back on drinking sodas. She has also been compliant with Novolog 25 U which she has been taking daily to twice daily but holds it if her sugars are too low. She reports having recent symptomatic hypoglycemia. She checks her blood sugar 2-3 times a day and her glucose meter reveals values since last visit of 76- 347. She was previously on metformin but was noncompliant with taking it with no GI intolerance. She has occasional peripheral neuropathy but denies blurry vision, polydipsia, polyuria, and polyphagia. She had a recent abrasion above her left great toe that is healing well. She is trying to follow a low-carbohydrate diet and exercise regularly. She reports recent weight loss (per flow sheet 3 lb weight gain since last visit).  She has history of iron deficency anemia thought be due to menorrhagia with last ferritin of 16 on 10/25/13.  She was supposed to see gynecology in the past but missed her appointment. She reports chronic fatigue and used to having cravings for starch but non recently. She has not been compliant with taking PO iron sulfate with meals.     She reports having seasonal allergic rhinitis with recent symptoms. She was given claritin at last visit with mild relief. She has never tried zyrtec in the past or intranasal corticosteroid spray. She is unsure of her triggers and has never had skin testing in the past.     Past Medical History  Diagnosis Date  .  Asthma   . Diabetes mellitus   . Hyperlipidemia   . Tobacco abuse   . Seasonal allergies    Current Outpatient Prescriptions  Medication Sig Dispense Refill  . albuterol (PROVENTIL HFA;VENTOLIN HFA) 108 (90 BASE) MCG/ACT inhaler Inhale 2 puffs into the lungs every 6 (six) hours as needed for wheezing or shortness of breath.  1 Inhaler  2  . benzonatate (TESSALON) 100 MG capsule Take 1 capsule (100 mg total) by mouth 3 (three) times daily as needed for cough.  20 capsule  0  . docusate sodium (COLACE) 100 MG capsule Take 1 capsule (100 mg total) by mouth 2 (two) times daily.  60 capsule  3  . ferrous sulfate 325 (65 FE) MG tablet Take 1 tablet (325 mg total) by mouth 3 (three) times daily with meals.  90 tablet  3  . insulin aspart protamine- aspart (NOVOLOG MIX 70/30) (70-30) 100 UNIT/ML injection Inject 25 Units into the skin 2 (two) times daily with a meal.      . loratadine (CLARITIN) 10 MG tablet Take 1 tablet (10 mg total) by mouth daily.  30 tablet  0   No current facility-administered medications for this visit.   Family History  Problem Relation Age of Onset  . Stroke Mother   . Diabetes Father   . Diabetes Mother   . Diabetes Maternal Grandmother   . Diabetes Maternal Grandfather   . Hypertension Mother   . Hypertension Father   .  Cancer Paternal Grandfather     Prostate  . Aneurysm Mother    History   Social History  . Marital Status: Legally Separated    Spouse Name: N/A    Number of Children: N/A  . Years of Education: 13   Occupational History  .     Social History Main Topics  . Smoking status: Current Every Day Smoker -- 0.30 packs/day for 1 years    Types: Cigarettes    Last Attempt to Quit: 09/02/2010  . Smokeless tobacco: Not on file  . Alcohol Use: Yes  . Drug Use: No  . Sexual Activity: Not on file   Other Topics Concern  . Not on file   Social History Narrative  . No narrative on file   Review of Systems: Review of Systems    Constitutional: Positive for weight loss and malaise/fatigue (chronic). Negative for fever and chills.  HENT: Positive for congestion.        Rhinorrhea  Respiratory: Negative for cough, shortness of breath and wheezing.   Cardiovascular: Negative for chest pain and leg swelling.  Gastrointestinal: Positive for nausea (occasionally). Negative for vomiting, abdominal pain, diarrhea, constipation and blood in stool.  Genitourinary: Negative for dysuria, urgency, frequency and hematuria.  Musculoskeletal: Negative for myalgias.  Skin: Negative for rash.  Neurological: Positive for dizziness (with low blood sugar) and sensory change (peripheral neuropathy). Negative for headaches.  Endo/Heme/Allergies: Positive for environmental allergies. Negative for polydipsia.    Objective:  Physical Exam: Filed Vitals:   08/15/14 1446  BP: 141/80  Pulse: 99  Temp: 98.5 F (36.9 C)  TempSrc: Oral  Weight: 213 lb 12.8 oz (96.979 kg)  SpO2: 98%    Physical Exam  Constitutional: She is oriented to person, place, and time. She appears well-developed and well-nourished. No distress.  HENT:  Head: Normocephalic and atraumatic.  Right Ear: External ear normal.  Left Ear: External ear normal.  Nose: Nose normal.  Mouth/Throat: Oropharynx is clear and moist. No oropharyngeal exudate.  Eyes: Conjunctivae and EOM are normal. Pupils are equal, round, and reactive to light. Right eye exhibits no discharge. Left eye exhibits no discharge. No scleral icterus.  Neck: Normal range of motion. Neck supple.  Cardiovascular: Normal rate, regular rhythm and normal heart sounds.   No murmur heard. Pulmonary/Chest: Effort normal and breath sounds normal. No respiratory distress. She has no wheezes. She has no rales.  Abdominal: Soft. Bowel sounds are normal. She exhibits no distension. There is no tenderness. There is no rebound and no guarding.  Musculoskeletal: Normal range of motion. She exhibits edema (trace   b/l pedal ). She exhibits no tenderness.  Neurological: She is alert and oriented to person, place, and time.  Normal sensation to light touch of extremities   Skin: Skin is warm and dry. No rash noted. She is not diaphoretic. No erythema. No pallor.  Small healing skin abrasion above left great toe  Psychiatric: She has a normal mood and affect. Her behavior is normal. Judgment and thought content normal.    Assessment & Plan:   Please see problem list for problem-based assessment and plan

## 2014-08-15 NOTE — Patient Instructions (Addendum)
-  Start taking metformin 500 mg twice a day and decrease your Novolog to 20 U twice a day -Start taking lisinopril 5 mg daily (for high blood pressure and protein in your urine) and pravastatin 40 mg daily for high cholesterol  -Start taking zyrtec 10 mg daily and flonase (2 sprays in each nostril daily) for your allergies  -Will refer you to gynecology for your anemia and heavy periods  -Will check your blood work and get a pneumonia vaccine  -Will see you back in 1 month   General Instructions:   Please bring your medicines with you each time you come to clinic.  Medicines may include prescription medications, over-the-counter medications, herbal remedies, eye drops, vitamins, or other pills.   Progress Toward Treatment Goals:  Treatment Goal 10/16/2012  Hemoglobin A1C unchanged    Self Care Goals & Plans:  Self Care Goal 10/28/2013  Manage my medications take my medicines as prescribed; refill my medications on time  Monitor my health keep track of my blood glucose; bring my glucose meter and log to each visit; keep track of my weight; check my feet daily  Eat healthy foods eat foods that are low in salt; eat baked foods instead of fried foods  Be physically active find an activity I enjoy    Home Blood Glucose Monitoring 10/16/2012  Check my blood sugar 2 times a day  When to check my blood sugar before breakfast; before dinner     Care Management & Community Referrals:  Referral 10/16/2012  Referrals made for care management support diabetes educator

## 2014-08-16 DIAGNOSIS — J302 Other seasonal allergic rhinitis: Secondary | ICD-10-CM | POA: Insufficient documentation

## 2014-08-16 DIAGNOSIS — I10 Essential (primary) hypertension: Secondary | ICD-10-CM | POA: Insufficient documentation

## 2014-08-16 DIAGNOSIS — Z0000 Encounter for general adult medical examination without abnormal findings: Secondary | ICD-10-CM | POA: Insufficient documentation

## 2014-08-16 LAB — COMPLETE METABOLIC PANEL WITH GFR
ALT: 24 U/L (ref 0–35)
AST: 23 U/L (ref 0–37)
Albumin: 3.5 g/dL (ref 3.5–5.2)
Alkaline Phosphatase: 79 U/L (ref 39–117)
BILIRUBIN TOTAL: 0.3 mg/dL (ref 0.2–1.2)
BUN: 11 mg/dL (ref 6–23)
CO2: 28 mEq/L (ref 19–32)
CREATININE: 0.99 mg/dL (ref 0.50–1.10)
Calcium: 8.7 mg/dL (ref 8.4–10.5)
Chloride: 105 mEq/L (ref 96–112)
GFR, Est African American: 83 mL/min
GFR, Est Non African American: 72 mL/min
Glucose, Bld: 196 mg/dL — ABNORMAL HIGH (ref 70–99)
Potassium: 4.2 mEq/L (ref 3.5–5.3)
Sodium: 141 mEq/L (ref 135–145)
Total Protein: 5.9 g/dL — ABNORMAL LOW (ref 6.0–8.3)

## 2014-08-16 LAB — HEMOGLOBIN A1C
HEMOGLOBIN A1C: 13.9 % — AB (ref <5.7)
Mean Plasma Glucose: 352 mg/dL — ABNORMAL HIGH (ref <117)

## 2014-08-16 NOTE — Assessment & Plan Note (Signed)
-  Pt received pneumococcal vaccine today on 08/15/14. -Pt to have pap smear testing at gynecology.

## 2014-08-16 NOTE — Assessment & Plan Note (Signed)
Assessment: Pt with last lipid panel on 10/22/13 with hypercholesteremia noncompliant with statin therapy with 10-yr ASCVD risk of 1.4% and lifetime risk of 39% with recommendations to start moderate intensity statin therapy.    Plan: -LDL 140 not at diabetic goal <100 -Prescribe pravastatin 40 mg daily -Obtain CMP ---> normal -Monitor for myalgias

## 2014-08-16 NOTE — Assessment & Plan Note (Signed)
Assessment: Pt with seasonal allergic rhinitis who presents with uncontrolled symptoms on newly started anti-histamine therapy.  Plan: -Discontinue loratadine and prescribe cetirizine 10 mg daily  -Prescribe fluticasone nasal spray 2 sprays in each nostril daily -Consider referral to allergy and immunology for skin testing in order identify and avoid allergen exposure

## 2014-08-16 NOTE — Assessment & Plan Note (Addendum)
Assessment: Pt with last A1c of >14 on 08/05/14 with recent compliance with insulin therapy and symptomatic hypoglycemia who presents with BG 196 and A1c of 13.9.  Plan:  -A1c 13.9 not at goal <7, start metformin 500 mg BID (will titrate up to 1000 mg BID at next visit if tolerates well) and decrease Novolog 70/30 from 25 U BID to 20 U BID due to recent symptomatic hypoglycemia -BP 141/80 not at goal <140/90, start lisinopril 5 mg daily for concomitant proteinuria -LDL 140 not at goal <100, start pravastatin 40 mg daily -Last annual eye exam on 10/28/13 with b/l moderate NPDR -Last annual urine microalbumin test on 08/05/14 with 329 mg of proteinuria, start lisinopril 5 mg daily  -Last annual foot exam on 10/28/13 -BMI 35.58 not at goal <30, encourage weight loss -To return in 1 month

## 2014-08-16 NOTE — Assessment & Plan Note (Signed)
Assessment: Pt with history of elevated blood pressure readings not previously on anti-hypertensive therapy who presents with blood pressure of 141/80.   Plan: -BP 141/80 near goal <140/90 -Prescribe lisinopril 5 mg daily in setting of proteinuria -Obtain CMP ---> normal

## 2014-08-16 NOTE — Assessment & Plan Note (Addendum)
Assessment: Pt is a menstruating woman with iron-deficiency anemia most likely due to menorrhagia noncompliant with oral iron therapy with last ferritin of 16 on 10/25/13. Hg of 10.1 on 07/30/14 who presents with chronic fatigue without hemodynamic instability or active bleeding.   Plan:  -Continue ferrous sulfate 325 mg TID with meals (colace PRN for constipation) and repeat anemia panel in 1-2  months to assess for effectiveness  -Last CBC on 07/30/14 with stable Hg 10.1 near baseline 10-1 -Refer to gynecology for further evaluation of etiology of menorrhagia (last abd [place] on 11/28/10 was normal)

## 2014-08-18 ENCOUNTER — Encounter: Payer: Self-pay | Admitting: *Deleted

## 2014-08-18 NOTE — Progress Notes (Signed)
INTERNAL MEDICINE TEACHING ATTENDING ADDENDUM - Tabius Rood, MD: I reviewed and discussed at the time of visit with the resident Dr. Rabbani, the patient's medical history, physical examination, diagnosis and results of pertinent tests and treatment and I agree with the patient's care as documented.  

## 2014-09-19 ENCOUNTER — Ambulatory Visit (INDEPENDENT_AMBULATORY_CARE_PROVIDER_SITE_OTHER): Payer: Self-pay | Admitting: Internal Medicine

## 2014-09-19 ENCOUNTER — Encounter: Payer: Self-pay | Admitting: Internal Medicine

## 2014-09-19 VITALS — BP 127/63 | HR 88 | Temp 98.2°F | Wt 211.8 lb

## 2014-09-19 DIAGNOSIS — I10 Essential (primary) hypertension: Secondary | ICD-10-CM

## 2014-09-19 DIAGNOSIS — IMO0002 Reserved for concepts with insufficient information to code with codable children: Secondary | ICD-10-CM

## 2014-09-19 DIAGNOSIS — E11339 Type 2 diabetes mellitus with moderate nonproliferative diabetic retinopathy without macular edema: Secondary | ICD-10-CM

## 2014-09-19 DIAGNOSIS — G629 Polyneuropathy, unspecified: Secondary | ICD-10-CM

## 2014-09-19 DIAGNOSIS — E113399 Type 2 diabetes mellitus with moderate nonproliferative diabetic retinopathy without macular edema, unspecified eye: Secondary | ICD-10-CM

## 2014-09-19 DIAGNOSIS — D509 Iron deficiency anemia, unspecified: Secondary | ICD-10-CM

## 2014-09-19 DIAGNOSIS — Z0000 Encounter for general adult medical examination without abnormal findings: Secondary | ICD-10-CM

## 2014-09-19 DIAGNOSIS — E1165 Type 2 diabetes mellitus with hyperglycemia: Secondary | ICD-10-CM

## 2014-09-19 LAB — CBC
HCT: 28.8 % — ABNORMAL LOW (ref 36.0–46.0)
HEMOGLOBIN: 8.7 g/dL — AB (ref 12.0–15.0)
MCH: 23.4 pg — AB (ref 26.0–34.0)
MCHC: 30.2 g/dL (ref 30.0–36.0)
MCV: 77.4 fL — ABNORMAL LOW (ref 78.0–100.0)
Platelets: 292 10*3/uL (ref 150–400)
RBC: 3.72 MIL/uL — ABNORMAL LOW (ref 3.87–5.11)
RDW: 15.7 % — ABNORMAL HIGH (ref 11.5–15.5)
WBC: 5.7 10*3/uL (ref 4.0–10.5)

## 2014-09-19 LAB — BASIC METABOLIC PANEL WITH GFR
BUN: 16 mg/dL (ref 6–23)
CO2: 24 meq/L (ref 19–32)
Calcium: 8.6 mg/dL (ref 8.4–10.5)
Chloride: 99 mEq/L (ref 96–112)
Creat: 1.17 mg/dL — ABNORMAL HIGH (ref 0.50–1.10)
GFR, EST AFRICAN AMERICAN: 68 mL/min
GFR, Est Non African American: 59 mL/min — ABNORMAL LOW
GLUCOSE: 443 mg/dL — AB (ref 70–99)
POTASSIUM: 4.9 meq/L (ref 3.5–5.3)
SODIUM: 134 meq/L — AB (ref 135–145)

## 2014-09-19 MED ORDER — GABAPENTIN 300 MG PO CAPS: ORAL_CAPSULE | ORAL | Status: DC

## 2014-09-19 NOTE — Progress Notes (Signed)
Patient ID: Jane [name], female   DOB: 1976-11-08, 38 y.o.   MRN: [id number]    Subjective:   Patient ID: Jane [name] female   DOB: [birth year] 38 y.o.   MRN: [id number]  HPI: [name] is a 38 y.o. pleasant woman with past medical history of uncontrolled Type II DM, asthma, hyperlipidemia, iron-deficiency anemia, and allergic rhinitis who presents for routine follow-up visit.   Her last A1c was 13.9 on last visit on 08/15/14. She reports she is compliant with taking Novolog (70/30) 20 U BID but sometimes holds it if her blood sugars are low. She checks her blood glucose 1-2 times daily and has brought her glucose meter today that reveals range of 65-354 with average of 200. She reports having symptomatic hypoglycemia if she does not eat but has improved since last visit. She did not start taking metformin 500 mg BID that was prescribed at last visit but reports she will pick it up today. She continues to have peripheral neuropathy in her toes with burning pain and mild paraesthesias without weakness and would like to try medication. She denies blurry vision, polydipsia, polyuria, polyphagia, or foot injury/ulcer. She continues to follow a healthy diet and is active at her job. She has lost 2 lb since last visit one month ago.  She has been compliant with taking lisinopril 5 mg daily that was prescribed at last visit. She denies headache, chest pain, palpitations, or lightheadedness.   She has history of iron deficency anemia thought be due to menorrhagia with last ferritin of 16 on 10/25/13. She is scheduled to see gynecology the end of this month. She reports chronic fatigue and some improvement in the amount of her menstrual bleeding. She has been compliant with taking iron pills since last visit although may not always take them three times daily.      Past Medical History  Diagnosis Date  . Asthma   . Diabetes mellitus   . Hyperlipidemia   . Tobacco abuse     . Seasonal allergies    Current Outpatient Prescriptions  Medication Sig Dispense Refill  . albuterol (PROVENTIL HFA;VENTOLIN HFA) 108 (90 BASE) MCG/ACT inhaler Inhale 2 puffs into the lungs every 6 (six) hours as needed for wheezing or shortness of breath. 1 Inhaler 2  . benzonatate (TESSALON) 100 MG capsule Take 1 capsule (100 mg total) by mouth 3 (three) times daily as needed for cough. 20 capsule 0  . cetirizine (ZYRTEC) 10 MG tablet Take 1 tablet (10 mg total) by mouth daily. 90 tablet 3  . docusate sodium (COLACE) 100 MG capsule Take 1 capsule (100 mg total) by mouth 2 (two) times daily. 60 capsule 3  . ferrous sulfate 325 (65 FE) MG tablet Take 1 tablet (325 mg total) by mouth 3 (three) times daily with meals. 90 tablet 3  . fluticasone (FLONASE) 50 MCG/ACT nasal spray Place 2 sprays into both nostrils daily. 16 g 2  . insulin aspart protamine- aspart (NOVOLOG MIX 70/30) (70-30) 100 UNIT/ML injection Inject 25 Units into the skin 2 (two) times daily with a meal.    . lisinopril (PRINIVIL,ZESTRIL) 5 MG tablet Take 1 tablet (5 mg total) by mouth daily. 90 tablet 3  . metFORMIN (GLUCOPHAGE) 500 MG tablet Take 1 tablet (500 mg total) by mouth 2 (two) times daily with a meal. 60 tablet 3  . pravastatin (PRAVACHOL) 40 MG tablet Take 1 tablet (40 mg total) by mouth daily. 90 tablet 3   No  current facility-administered medications for this visit.   Family History  Problem Relation Age of Onset  . Stroke Mother   . Diabetes Father   . Diabetes Mother   . Diabetes Maternal Grandmother   . Diabetes Maternal Grandfather   . Hypertension Mother   . Hypertension Father   . Cancer Paternal Grandfather     Prostate  . Aneurysm Mother    History   Social History  . Marital Status: Legally Separated    Spouse Name: N/A    Number of Children: N/A  . Years of Education: 13   Occupational History  .     Social History Main Topics  . Smoking status: Former Smoker -- 0.30 packs/day for 1  years    Types: Cigarettes    Quit date: 07/15/2010  . Smokeless tobacco: None  . Alcohol Use: 0.0 oz/week    0 Not specified per week  . Drug Use: No  . Sexual Activity: None   Other Topics Concern  . None   Social History Narrative   Review of Systems:  Review of Systems  Constitutional: Positive for weight loss (intentional ), malaise/fatigue (chronic) and diaphoresis (with hypoglycemia ). Negative for fever and chills.  HENT: Positive for congestion (due to allergies).   Eyes: Negative for blurred vision.  Respiratory: Negative for cough, shortness of breath and wheezing.   Cardiovascular: Negative for chest pain, palpitations and leg swelling.  Gastrointestinal: Negative for nausea, vomiting, abdominal pain, diarrhea and constipation.  Genitourinary: Negative for dysuria, urgency and frequency.  Neurological: Positive for dizziness (with hypoglycemia) and sensory change (mild parasthesias in toes). Negative for focal weakness and headaches.  Endo/Heme/Allergies: Positive for environmental allergies. Negative for polydipsia.     Objective:  Physical Exam: Filed Vitals:   09/19/14 1328  BP: 127/63  Pulse: 88  Temp: 98.2 F (36.8 C)  TempSrc: Oral  Weight: 211 lb 12.8 oz (96.072 kg)  SpO2: 100%    Physical Exam  Constitutional: She is oriented to person, place, and time. She appears well-developed and well-nourished. No distress.  HENT:  Head: Normocephalic and atraumatic.  Right Ear: External ear normal.  Left Ear: External ear normal.  Nose: Nose normal.  Mouth/Throat: Oropharynx is clear and moist. No oropharyngeal exudate.  Eyes: Conjunctivae and EOM are normal. Pupils are equal, round, and reactive to light. Right eye exhibits no discharge. Left eye exhibits no discharge. No scleral icterus.  Neck: Normal range of motion. Neck supple.  Cardiovascular: Normal rate, regular rhythm and normal heart sounds.   Pulmonary/Chest: Effort normal and breath sounds  normal. No respiratory distress. She has no wheezes. She has no rales.  Abdominal: Soft. Bowel sounds are normal. She exhibits no distension. There is no tenderness. There is no rebound and no guarding.  Musculoskeletal: Normal range of motion. She exhibits no edema or tenderness.  Neurological: She is alert and oriented to person, place, and time.  Normal sensation to light touch of extremities. Normal 5/5 muscle strength throughout.    Skin: Skin is warm and dry. No rash noted. She is not diaphoretic. No erythema. No pallor.  Psychiatric: She has a normal mood and affect. Her behavior is normal. Judgment and thought content normal.    Assessment & Plan:   Please see problem list for problem-based assessment and plan

## 2014-09-19 NOTE — Patient Instructions (Addendum)
-  Start taking metformin 500 mg twice a day and increase your  Novolog from 20 U twice a day to 22 U twice daily, check your blood sugar 2-3 times a day  -Start taking gabapentin 1 capsule the 1st day then 2 capsules the 2nd day then 3 capsules daily for your neuropathy -Start taking pravastatin 40 mg for your high cholesterol  -Will check your bloodwork today -Please attend your gynecology appointment later this month -Will see you back in 2 months, nice seeing you today!   General Instructions:   Thank you for bringing your medicines today. This helps [place] keep you safe from mistakes.   Progress Toward Treatment Goals:  Treatment Goal 10/16/2012  Hemoglobin A1C unchanged    Self Care Goals & Plans:  Self Care Goal 10/28/2013  Manage my medications take my medicines as prescribed; refill my medications on time  Monitor my health keep track of my blood glucose; bring my glucose meter and log to each visit; keep track of my weight; check my feet daily  Eat healthy foods eat foods that are low in salt; eat baked foods instead of fried foods  Be physically active find an activity I enjoy    Home Blood Glucose Monitoring 10/16/2012  Check my blood sugar 2 times a day  When to check my blood sugar before breakfast; before dinner     Care Management & Community Referrals:  Referral 10/16/2012  Referrals made for care management support diabetes educator

## 2014-09-20 ENCOUNTER — Encounter: Payer: Self-pay | Admitting: Internal Medicine

## 2014-09-20 DIAGNOSIS — G629 Polyneuropathy, unspecified: Secondary | ICD-10-CM | POA: Insufficient documentation

## 2014-09-20 NOTE — Assessment & Plan Note (Signed)
Assessment: Pt is a menstruating woman with iron-deficiency anemia most likely due to menorrhagia compliant with oral iron therapy with last ferritin of 16 on 10/25/13 and baseline Hg 10-11 who presents with chronic fatigue without hemodynamic instability or active bleeding.   Plan:  -Continue ferrous sulfate 325 mg TID with meals (colace PRN for constipation) and repeat anemia panel at next visit to assess for effectiveness  -Obtain CBC  ---> Hg 8.7 below baseline 10-11 (pt reports recent menstrual period) -Pt has gynecology appointment on 10/13/14 for further evaluation of etiology of menorrhagia (last abd [place] on 11/28/10 was normal)

## 2014-09-20 NOTE — Assessment & Plan Note (Signed)
Assessment: Pt with moderately well-controlled hypertension compliant with one-class (ACEi) anti-hypertensive therapy who presents with blood pressure of 127/63.   Plan: -BP 127/63 at goal <140/90 -Continue lisinopril 5 mg daily in setting of proteinuria, consider increasing to 10 mg daily -Obtain BMP ---> GFR decrease from 86 to 68

## 2014-09-20 NOTE — Assessment & Plan Note (Signed)
Pt to have pap smear testing at gynecology.

## 2014-09-20 NOTE — Assessment & Plan Note (Signed)
Assessment: Pt with last A1c of 13.9 on 08/15/14 compliant with insulin therapy and noncompliant with oral hypoglycemic therapy with symptomatic hypoglycemia who presents with blood glucose of 443.   Plan:  -A1c 13.9 not at goal <7, Pt to start metformin 500 mg BID that was prescribed at last visit (will titrate up to 1000 mg BID at next visit if tolerates well) and increase Novolog 70/30 from 20 U BID to 22 U BID due to symptomatic hypoglycemia -BP 127/63 at goal  <140/90, continue lisinopril 5 mg daily  -LDL 140 not at goal <100, pt to start pravastatin 40 mg daily that was prescribed at last visit  -Last annual eye exam on 10/28/13 with b/l moderate NPDR,  -Last annual urine microalbumin test on 08/05/14 with 329 mg of proteinuria, continue lisinopril 5 mg daily  -Last annual foot exam on 10/28/13 -BMI 35.25 not at goal <30, encourage weight loss -To return in 2 months

## 2014-09-20 NOTE — Assessment & Plan Note (Addendum)
Assessment: Pt with chronic peripheral neuropathy in setting of chronic uncontrolled Type II DM who presents with burning pain and mild paraesthesias without weakness, areflexia, or lack of proprioception most likely due to small fiber neuropathy.   Plan: -Prescribe gabapentin 300 mg TID (pt instructed to take 300 mg x 1 day, 300 BID x 1 day, then 300 TID  -Obtain neuropathy work-up at next visit

## 2014-09-22 NOTE — Progress Notes (Signed)
INTERNAL MEDICINE TEACHING ATTENDING ADDENDUM - Madaleine Simmon, MD: I reviewed and discussed at the time of visit with the resident Dr. Rabbani, the patient's medical history, physical examination, diagnosis and results of pertinent tests and treatment and I agree with the patient's care as documented.  

## 2014-10-13 ENCOUNTER — Encounter (HOSPITAL_COMMUNITY): Payer: Self-pay | Admitting: Emergency Medicine

## 2014-10-13 ENCOUNTER — Emergency Department (HOSPITAL_COMMUNITY): Payer: Self-pay

## 2014-10-13 ENCOUNTER — Encounter: Payer: Self-pay | Admitting: Obstetrics & Gynecology

## 2014-10-13 ENCOUNTER — Emergency Department (HOSPITAL_COMMUNITY)
Admission: EM | Admit: 2014-10-13 | Discharge: 2014-10-14 | Disposition: A | Discharge status: Home / Self Care | Payer: Self-pay | Attending: Emergency Medicine | Admitting: Emergency Medicine

## 2014-10-13 DIAGNOSIS — M25572 Pain in left ankle and joints of left foot: Secondary | ICD-10-CM | POA: Insufficient documentation

## 2014-10-13 DIAGNOSIS — J45909 Unspecified asthma, uncomplicated: Secondary | ICD-10-CM | POA: Insufficient documentation

## 2014-10-13 DIAGNOSIS — Z7951 Long term (current) use of inhaled steroids: Secondary | ICD-10-CM | POA: Insufficient documentation

## 2014-10-13 DIAGNOSIS — Z87891 Personal history of nicotine dependence: Secondary | ICD-10-CM | POA: Insufficient documentation

## 2014-10-13 DIAGNOSIS — Z794 Long term (current) use of insulin: Secondary | ICD-10-CM | POA: Insufficient documentation

## 2014-10-13 DIAGNOSIS — E785 Hyperlipidemia, unspecified: Secondary | ICD-10-CM | POA: Insufficient documentation

## 2014-10-13 DIAGNOSIS — Z79899 Other long term (current) drug therapy: Secondary | ICD-10-CM | POA: Insufficient documentation

## 2014-10-13 DIAGNOSIS — E119 Type 2 diabetes mellitus without complications: Secondary | ICD-10-CM | POA: Insufficient documentation

## 2014-10-13 DIAGNOSIS — R739 Hyperglycemia, unspecified: Secondary | ICD-10-CM

## 2014-10-13 LAB — BASIC METABOLIC PANEL
ANION GAP: 14 (ref 5–15)
BUN: 14 mg/dL (ref 6–23)
CO2: 23 meq/L (ref 19–32)
CREATININE: 0.96 mg/dL (ref 0.50–1.10)
Calcium: 8.8 mg/dL (ref 8.4–10.5)
Chloride: 97 mEq/L (ref 96–112)
GFR calc Af Amer: 85 mL/min — ABNORMAL LOW (ref >90)
GFR calc non Af Amer: 74 mL/min — ABNORMAL LOW (ref >90)
Glucose, Bld: 533 mg/dL — ABNORMAL HIGH (ref 70–99)
POTASSIUM: 4.3 meq/L (ref 3.7–5.3)
Sodium: 134 mEq/L — ABNORMAL LOW (ref 137–147)

## 2014-10-13 LAB — CBC
HEMATOCRIT: 30.7 % — AB (ref 36.0–46.0)
HEMOGLOBIN: 9.5 g/dL — AB (ref 12.0–15.0)
MCH: 22.8 pg — ABNORMAL LOW (ref 26.0–34.0)
MCHC: 30.9 g/dL (ref 30.0–36.0)
MCV: 73.8 fL — ABNORMAL LOW (ref 78.0–100.0)
Platelets: 278 10*3/uL (ref 150–400)
RBC: 4.16 MIL/uL (ref 3.87–5.11)
RDW: 14.5 % (ref 11.5–15.5)
WBC: 5.7 10*3/uL (ref 4.0–10.5)

## 2014-10-13 LAB — URINALYSIS, ROUTINE W REFLEX MICROSCOPIC
Bilirubin Urine: NEGATIVE
KETONES UR: NEGATIVE mg/dL
LEUKOCYTES UA: NEGATIVE
Nitrite: NEGATIVE
PH: 5.5 (ref 5.0–8.0)
Protein, ur: NEGATIVE mg/dL
SPECIFIC GRAVITY, URINE: 1.04 — AB (ref 1.005–1.030)
Urobilinogen, UA: 1 mg/dL (ref 0.0–1.0)

## 2014-10-13 LAB — URINE MICROSCOPIC-ADD ON

## 2014-10-13 MED ORDER — SODIUM CHLORIDE 0.9 % IV BOLUS (SEPSIS)
1000.0000 mL | Freq: Once | INTRAVENOUS | Status: AC
  Administered 2014-10-13: 1000 mL via INTRAVENOUS

## 2014-10-13 MED ORDER — TRAMADOL HCL 50 MG PO TABS: 50.0000 mg | ORAL_TABLET | Freq: Four times a day (QID) | ORAL | Status: DC | PRN

## 2014-10-13 MED ORDER — INSULIN ASPART 100 UNIT/ML ~~LOC~~ SOLN
15.0000 [IU] | Freq: Once | SUBCUTANEOUS | Status: AC
  Administered 2014-10-13: 15 [IU] via INTRAVENOUS
  Filled 2014-10-13: qty 1

## 2014-10-13 NOTE — Discharge Instructions (Signed)
Ankle Pain Ankle pain is a common symptom. The bones, cartilage, tendons, and muscles of the ankle joint perform a lot of work each day. The ankle joint holds your body weight and allows you to move around. Ankle pain can occur on either side or back of 1 or both ankles. Ankle pain may be sharp and burning or dull and aching. There may be tenderness, stiffness, redness, or warmth around the ankle. The pain occurs more often when a person walks or puts pressure on the ankle. CAUSES  There are many reasons ankle pain can develop. It is important to work with your caregiver to identify the cause since many conditions can impact the bones, cartilage, muscles, and tendons. Causes for ankle pain include:  Injury, including a break (fracture), sprain, or strain often due to a fall, sports, or a high-impact activity.  Swelling (inflammation) of a tendon (tendonitis).  Achilles tendon rupture.  Ankle instability after repeated sprains and strains.  Poor foot alignment.  Pressure on a nerve (tarsal tunnel syndrome).  Arthritis in the ankle or the lining of the ankle.  Crystal formation in the ankle (gout or pseudogout). DIAGNOSIS  A diagnosis is based on your medical history, your symptoms, results of your physical exam, and results of diagnostic tests. Diagnostic tests may include X-ray exams or a computerized magnetic scan (magnetic resonance imaging, MRI). TREATMENT  Treatment will depend on the cause of your ankle pain and may include:  Keeping pressure off the ankle and limiting activities.  Using crutches or other walking support (a cane or brace).  Using rest, ice, compression, and elevation.  Participating in physical therapy or home exercises.  Wearing shoe inserts or special shoes.  Losing weight.  Taking medications to reduce pain or swelling or receiving an injection.  Undergoing surgery. HOME CARE INSTRUCTIONS   Only take over-the-counter or prescription medicines for  pain, discomfort, or fever as directed by your caregiver.  Put ice on the injured area.  Put ice in a plastic bag.  Place a towel between your skin and the bag.  Leave the ice on for 15-20 minutes at a time, 03-04 times a day.  Keep your leg raised (elevated) when possible to lessen swelling.  Avoid activities that cause ankle pain.  Follow specific exercises as directed by your caregiver.  Record how often you have ankle pain, the location of the pain, and what it feels like. This information may be helpful to you and your caregiver.  Ask your caregiver about returning to work or sports and whether you should drive.  Follow up with your caregiver for further examination, therapy, or testing as directed. SEEK MEDICAL CARE IF:   Pain or swelling continues or worsens beyond 1 week.  You have an oral temperature above 102 F (38.9 C).  You are feeling unwell or have chills.  You are having an increasingly difficult time with walking.  You have loss of sensation or other new symptoms.  You have questions or concerns. MAKE SURE YOU:   Understand these instructions.  Will watch your condition.  Will get help right away if you are not doing well or get worse. Document Released: 04/20/2010 Document Revised: 01/23/2012 Document Reviewed: 04/20/2010 [facility] Patient Information 2015 [place], [place]. This information is not intended to replace advice given to you by your health care provider. Make sure you discuss any questions you have with your health care provider. RICE: Routine Care for Injuries The routine care of many injuries includes Rest, Ice,  Compression, and Elevation (RICE). HOME CARE INSTRUCTIONS  Rest is needed to allow your body to heal. Routine activities can usually be resumed when comfortable. Injured tendons and bones can take up to 6 weeks to heal. Tendons are the cord-like structures that attach muscle to bone.  Ice following an injury helps keep the  swelling down and reduces pain.  Put ice in a plastic bag.  Place a towel between your skin and the bag.  Leave the ice on for 15-20 minutes, 3-4 times a day, or as directed by your health care provider. Do this while awake, for the first 24 to 48 hours. After that, continue as directed by your caregiver.  Compression helps keep swelling down. It also gives support and helps with discomfort. If an elastic bandage has been applied, it should be removed and reapplied every 3 to 4 hours. It should not be applied tightly, but firmly enough to keep swelling down. Watch fingers or toes for swelling, bluish discoloration, coldness, numbness, or excessive pain. If any of these problems occur, remove the bandage and reapply loosely. Contact your caregiver if these problems continue.  Elevation helps reduce swelling and decreases pain. With extremities, such as the arms, hands, legs, and feet, the injured area should be placed near or above the level of the heart, if possible. SEEK IMMEDIATE MEDICAL CARE IF:  You have persistent pain and swelling.  You develop redness, numbness, or unexpected weakness.  Your symptoms are getting worse rather than improving after several days. These symptoms may indicate that further evaluation or further X-rays are needed. Sometimes, X-rays may not show a small broken bone (fracture) until 1 week or 10 days later. Make a follow-up appointment with your caregiver. Ask when your X-ray results will be ready. Make sure you get your X-ray results. Document Released: 02/12/2001 Document Revised: 11/05/2013 Document Reviewed: 04/01/2011 [facility] Patient Information 2015 [place], [place]. This information is not intended to replace advice given to you by your health care provider. Make sure you discuss any questions you have with your health care provider.

## 2014-10-13 NOTE — ED Provider Notes (Signed)
CSN: [id number]     Arrival date & time 10/13/14  2031 History   First MD Initiated Contact with Patient 10/13/14 2136     Chief Complaint  Patient presents with  . Ankle Pain    (Consider location/radiation/quality/duration/timing/severity/associated sxs/prior Treatment) Patient is a 38 y.o. female presenting with foot injury. The history is provided by the patient. No language interpreter was used.  Foot Injury Location:  Ankle Time since incident:  2 days Injury: no (no acute injury; hx of ankle injury 1 year ago)   Ankle location:  L ankle Pain details:    Quality:  Aching   Radiates to:  Does not radiate   Severity:  Mild   Onset quality:  Gradual   Duration:  2 days   Timing:  Constant   Progression:  Waxing and waning Prior injury to area:  Yes Relieved by:  None tried Worsened by:  Bearing weight (and dorsiflexion) Ineffective treatments:  None tried Associated symptoms: swelling   Associated symptoms: no decreased ROM, no fever, no muscle weakness and no tingling     Past Medical History  Diagnosis Date  . Asthma   . Diabetes mellitus   . Hyperlipidemia   . Seasonal allergies    Past Surgical History  Procedure Laterality Date  . Tubal ligation    . Cesarean section     Family History  Problem Relation Age of Onset  . Stroke Mother   . Diabetes Father   . Diabetes Mother   . Diabetes Maternal Grandmother   . Diabetes Maternal Grandfather   . Hypertension Mother   . Hypertension Father   . Cancer Paternal Grandfather     Prostate  . Aneurysm Mother    History  Substance Use Topics  . Smoking status: Former Smoker -- 0.30 packs/day for 1 years    Types: Cigarettes    Quit date: 07/15/2010  . Smokeless tobacco: Not on file  . Alcohol Use: 0.0 oz/week    0 Not specified per week   OB History    No data available      Review of Systems  Constitutional: Negative for fever.  Musculoskeletal: Positive for joint swelling and arthralgias.   Neurological: Negative for weakness.  All other systems reviewed and are negative.   Allergies  Review of patient's allergies indicates no known allergies.  Home Medications   Prior to Admission medications   Medication Sig Start Date End Date Taking? Authorizing Provider  albuterol (PROVENTIL HFA;VENTOLIN HFA) 108 (90 BASE) MCG/ACT inhaler Inhale 2 puffs into the lungs every 6 (six) hours as needed for wheezing or shortness of breath. 08/05/14   Blain Pais, MD  benzonatate (TESSALON) 100 MG capsule Take 1 capsule (100 mg total) by mouth 3 (three) times daily as needed for cough. 08/05/14   Blain Pais, MD  cetirizine (ZYRTEC) 10 MG tablet Take 1 tablet (10 mg total) by mouth daily. 08/15/14   Juluis Mire, MD  docusate sodium (COLACE) 100 MG capsule Take 1 capsule (100 mg total) by mouth 2 (two) times daily. 08/05/14   Blain Pais, MD  ferrous sulfate 325 (65 FE) MG tablet Take 1 tablet (325 mg total) by mouth 3 (three) times daily with meals. 08/05/14 08/05/15  Blain Pais, MD  fluticasone (FLONASE) 50 MCG/ACT nasal spray Place 2 sprays into both nostrils daily. 08/15/14   Juluis Mire, MD  gabapentin (NEURONTIN) 300 MG capsule Take 1 capsule on 1st day, then 2 capsules on 2nd  day, then 3 capsules daily 09/19/14 09/20/15  Juluis Mire, MD  insulin aspart protamine- aspart (NOVOLOG MIX 70/30) (70-30) 100 UNIT/ML injection Inject 22 Units into the skin 2 (two) times daily with a meal.    Historical Provider, MD  lisinopril (PRINIVIL,ZESTRIL) 5 MG tablet Take 1 tablet (5 mg total) by mouth daily. 08/15/14   Juluis Mire, MD  metFORMIN (GLUCOPHAGE) 500 MG tablet Take 1 tablet (500 mg total) by mouth 2 (two) times daily with a meal. 08/15/14   Juluis Mire, MD  pravastatin (PRAVACHOL) 40 MG tablet Take 1 tablet (40 mg total) by mouth daily. 08/15/14   Juluis Mire, MD  traMADol (ULTRAM) 50 MG tablet Take 1 tablet (50 mg total) by mouth every 6 (six) hours as  needed. 10/13/14   Antonietta Breach, PA-C   BP 126/70 mmHg  Pulse 91  Temp(Src) 98.2 F (36.8 C) (Oral)  Resp 16  Ht 5\' 5"  (1.651 m)  Wt 210 lb (95.255 kg)  BMI 34.95 kg/m2  SpO2 100%  LMP 09/20/2014   Physical Exam  Constitutional: She is oriented to person, place, and time. She appears well-developed and well-nourished. No distress.  Nontoxic/nonseptic appearing  HENT:  Head: Normocephalic and atraumatic.  Eyes: Conjunctivae and EOM are normal. No scleral icterus.  Neck: Normal range of motion.  Cardiovascular: Normal rate, regular rhythm and intact distal pulses.   DP and PT pulse 2+ in LLE  Pulmonary/Chest: Effort normal. No respiratory distress.  Musculoskeletal: Normal range of motion. She exhibits tenderness.       Left ankle: She exhibits swelling (mild anterior to lateral malleolus). She exhibits normal range of motion, no deformity, no laceration and normal pulse. Tenderness. Lateral malleolus tenderness found. Achilles tendon normal.       Left lower leg: Normal.       Left foot: Normal.  Neurological: She is alert and oriented to person, place, and time. She exhibits normal muscle tone. Coordination normal.  Sensation to light touch intact  Skin: Skin is warm and dry. No rash noted. She is not diaphoretic. No erythema. No pallor.  Psychiatric: She has a normal mood and affect. Her behavior is normal.  Nursing note and vitals reviewed.   ED Course  Procedures (including critical care time) Labs Review Labs Reviewed  BASIC METABOLIC PANEL - Abnormal; Notable for the following:    Sodium 134 (*)    Glucose, Bld 533 (*)    GFR calc non Af Amer 74 (*)    GFR calc Af Amer 85 (*)    All other components within normal limits  CBC - Abnormal; Notable for the following:    Hemoglobin 9.5 (*)    HCT 30.7 (*)    MCV 73.8 (*)    MCH 22.8 (*)    All other components within normal limits  URINALYSIS, ROUTINE W REFLEX MICROSCOPIC - Abnormal; Notable for the following:     Specific Gravity, Urine 1.040 (*)    Glucose, UA >1000 (*)    Hgb urine dipstick SMALL (*)    All other components within normal limits  URINE MICROSCOPIC-ADD ON  CBG MONITORING, ED  CBG MONITORING, ED  2230 - CBG, per tech - 462 0038 - CBG, per nurse - 193  Imaging Review Dg Ankle Complete Left  10/13/2014   CLINICAL DATA:  Left ankle pain.  EXAM: LEFT ANKLE COMPLETE - 3+ VIEW  COMPARISON:  None.  FINDINGS: There is no evidence of fracture, dislocation, or joint effusion. The ankle mortise  shows normal alignment. No bony lesions. Calcaneal spurs present. Soft tissues are unremarkable.  IMPRESSION: No acute findings.  Calcaneal spurs.   Electronically Signed   By: Aletta Edouard M.D.   On: 10/13/2014 22:02    EKG Interpretation None      MDM   Final diagnoses:  Ankle pain, left  Hyperglycemia    1556 - 38 year old female presents to the emergency department for further evaluation of left ankle pain. Patient neurovascularly intact. No evidence of septic joint. No crepitus, deformity, or effusion. Imaging negative. Suspect that symptoms are secondary to muscle strain. Will provide ASO ankle and refer patient to her PCP. Tramadol prescribed for pain and return precautions provided. Patient agreeable to plan with no unaddressed concerns.   Filed Vitals:   10/13/14 2050 10/13/14 2224  BP: 126/70 152/83  Pulse: 91 97  Temp: 98.2 F (36.8 C) 97.8 F (36.6 C)  TempSrc: Oral Oral  Resp: 16 18  Height: 5\' 5"  (1.651 m)   Weight: 210 lb (95.255 kg)   SpO2: 100% 100%    2245 - CBG checked prior to d/c. Read 462. Will initiate labs to evaluate for DKA.  0040 - Patient's labs reviewed. Anion gap 14. No leukocytosis. H/H stable. No ketonuria. No evidence of DKA. Patient treated for hyperglycemia with IV fluids as well as insulin. CBG now 193. She has been instructed to follow-up with her primary care provider. She is stable for discharge with necessary return precautions.  Antonietta Breach, PA-C 10/14/14 0041  Veryl Speak, MD 10/14/14 [phone]

## 2014-10-13 NOTE — ED Notes (Signed)
Pt returned from radiology.

## 2014-10-13 NOTE — ED Notes (Signed)
Pt CBG 462. RN notified
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
## 2014-10-13 NOTE — ED Notes (Signed)
PT CBG 453. RN notified.

## 2014-10-14 LAB — CBG MONITORING, ED
GLUCOSE-CAPILLARY: 453 mg/dL — AB (ref 70–99)
Glucose-Capillary: 193 mg/dL — ABNORMAL HIGH (ref 70–99)
Glucose-Capillary: 462 mg/dL — ABNORMAL HIGH (ref 70–99)

## 2014-10-14 NOTE — ED Notes (Signed)
Pt's CBG is 193. [place], [place] notified.

## 2014-11-20 ENCOUNTER — Encounter: Payer: Self-pay | Admitting: Obstetrics & Gynecology

## 2014-12-09 ENCOUNTER — Emergency Department (HOSPITAL_COMMUNITY)
Admission: EM | Admit: 2014-12-09 | Discharge: 2014-12-09 | Disposition: A | Discharge status: Home / Self Care | Payer: Self-pay | Attending: Emergency Medicine | Admitting: Emergency Medicine

## 2014-12-09 ENCOUNTER — Encounter (HOSPITAL_COMMUNITY): Payer: Self-pay | Admitting: Emergency Medicine

## 2014-12-09 DIAGNOSIS — Z79899 Other long term (current) drug therapy: Secondary | ICD-10-CM | POA: Insufficient documentation

## 2014-12-09 DIAGNOSIS — R739 Hyperglycemia, unspecified: Secondary | ICD-10-CM

## 2014-12-09 DIAGNOSIS — Z720 Tobacco use: Secondary | ICD-10-CM | POA: Insufficient documentation

## 2014-12-09 DIAGNOSIS — K529 Noninfective gastroenteritis and colitis, unspecified: Secondary | ICD-10-CM | POA: Insufficient documentation

## 2014-12-09 DIAGNOSIS — Z794 Long term (current) use of insulin: Secondary | ICD-10-CM | POA: Insufficient documentation

## 2014-12-09 DIAGNOSIS — Z9851 Tubal ligation status: Secondary | ICD-10-CM | POA: Insufficient documentation

## 2014-12-09 DIAGNOSIS — J45909 Unspecified asthma, uncomplicated: Secondary | ICD-10-CM | POA: Insufficient documentation

## 2014-12-09 DIAGNOSIS — Z9889 Other specified postprocedural states: Secondary | ICD-10-CM | POA: Insufficient documentation

## 2014-12-09 DIAGNOSIS — R531 Weakness: Secondary | ICD-10-CM | POA: Insufficient documentation

## 2014-12-09 DIAGNOSIS — Z7951 Long term (current) use of inhaled steroids: Secondary | ICD-10-CM | POA: Insufficient documentation

## 2014-12-09 DIAGNOSIS — E785 Hyperlipidemia, unspecified: Secondary | ICD-10-CM | POA: Insufficient documentation

## 2014-12-09 DIAGNOSIS — Z3202 Encounter for pregnancy test, result negative: Secondary | ICD-10-CM | POA: Insufficient documentation

## 2014-12-09 DIAGNOSIS — E1165 Type 2 diabetes mellitus with hyperglycemia: Secondary | ICD-10-CM | POA: Insufficient documentation

## 2014-12-09 DIAGNOSIS — R42 Dizziness and giddiness: Secondary | ICD-10-CM | POA: Insufficient documentation

## 2014-12-09 LAB — URINALYSIS, ROUTINE W REFLEX MICROSCOPIC
Bilirubin Urine: NEGATIVE
Glucose, UA: >1000 mg/dL — AB
Ketones, ur: NEGATIVE mg/dL
Leukocytes, UA: NEGATIVE
NITRITE: NEGATIVE
PROTEIN: 100 mg/dL — AB
Specific Gravity, Urine: 1.031 — ABNORMAL HIGH (ref 1.005–1.030)
UROBILINOGEN UA: 0.2 mg/dL (ref 0.0–1.0)
pH: 5 (ref 5.0–8.0)

## 2014-12-09 LAB — CBG MONITORING, ED
GLUCOSE-CAPILLARY: 460 mg/dL — AB (ref 70–99)
GLUCOSE-CAPILLARY: 344 mg/dL — AB (ref 70–99)
GLUCOSE-CAPILLARY: 194 mg/dL — AB (ref 70–99)
Glucose-Capillary: 419 mg/dL — ABNORMAL HIGH (ref 70–99)
Glucose-Capillary: 302 mg/dL — ABNORMAL HIGH (ref 70–99)
Glucose-Capillary: 255 mg/dL — ABNORMAL HIGH (ref 70–99)

## 2014-12-09 LAB — CBC WITH DIFFERENTIAL/PLATELET
Basophils Absolute: 0 10*3/uL (ref 0.0–0.1)
Basophils Relative: 0 % (ref 0–1)
EOS ABS: 0.1 10*3/uL (ref 0.0–0.7)
Eosinophils Relative: 2 % (ref 0–5)
HCT: 27.4 % — ABNORMAL LOW (ref 36.0–46.0)
Hemoglobin: 8.8 g/dL — ABNORMAL LOW (ref 12.0–15.0)
LYMPHS PCT: 25 % (ref 12–46)
Lymphs Abs: 1.7 10*3/uL (ref 0.7–4.0)
MCH: 23.5 pg — ABNORMAL LOW (ref 26.0–34.0)
MCHC: 32.1 g/dL (ref 30.0–36.0)
MCV: 73.1 fL — AB (ref 78.0–100.0)
MONO ABS: 0.3 10*3/uL (ref 0.1–1.0)
Monocytes Relative: 5 % (ref 3–12)
Neutro Abs: 4.7 10*3/uL (ref 1.7–7.7)
Neutrophils Relative %: 68 % (ref 43–77)
Platelets: 238 10*3/uL (ref 150–400)
RBC: 3.75 MIL/uL — AB (ref 3.87–5.11)
RDW: 17.1 % — ABNORMAL HIGH (ref 11.5–15.5)
WBC: 6.9 10*3/uL (ref 4.0–10.5)

## 2014-12-09 LAB — COMPREHENSIVE METABOLIC PANEL
ALK PHOS: 85 U/L (ref 39–117)
ALT: 15 U/L (ref 0–35)
ANION GAP: 9 (ref 5–15)
AST: 17 U/L (ref 0–37)
Albumin: 3.1 g/dL — ABNORMAL LOW (ref 3.5–5.2)
BUN: 24 mg/dL — ABNORMAL HIGH (ref 6–23)
CO2: 25 mmol/L (ref 19–32)
Calcium: 8.4 mg/dL (ref 8.4–10.5)
Chloride: 96 mmol/L (ref 96–112)
Creatinine, Ser: 1.32 mg/dL — ABNORMAL HIGH (ref 0.50–1.10)
GFR calc Af Amer: 58 mL/min — ABNORMAL LOW (ref >90)
GFR calc non Af Amer: 50 mL/min — ABNORMAL LOW (ref >90)
Glucose, Bld: 527 mg/dL — ABNORMAL HIGH (ref 70–99)
Potassium: 4.3 mmol/L (ref 3.5–5.1)
Sodium: 130 mmol/L — ABNORMAL LOW (ref 135–145)
Total Bilirubin: 0.6 mg/dL (ref 0.3–1.2)
Total Protein: 6.6 g/dL (ref 6.0–8.3)

## 2014-12-09 LAB — URINE MICROSCOPIC-ADD ON

## 2014-12-09 LAB — I-STAT VENOUS BLOOD GAS, ED
Acid-base deficit: 2 mmol/L (ref 0.0–2.0)
Bicarbonate: 23.9 mEq/L (ref 20.0–24.0)
O2 Saturation: 92 %
PCO2 VEN: 43.9 mmHg — AB (ref 45.0–50.0)
PH VEN: 7.343 — AB (ref 7.250–7.300)
PO2 VEN: 69 mmHg — AB (ref 30.0–45.0)
Patient temperature: 37
TCO2: 25 mmol/L (ref 0–100)

## 2014-12-09 LAB — POC URINE PREG, ED: PREG TEST UR: NEGATIVE

## 2014-12-09 MED ORDER — ONDANSETRON 4 MG PO TBDP
8.0000 mg | ORAL_TABLET | Freq: Once | ORAL | Status: AC
Start: 2014-12-09 — End: 2014-12-09
  Administered 2014-12-09: 8 mg via ORAL
  Filled 2014-12-09: qty 2

## 2014-12-09 MED ORDER — ONDANSETRON HCL 4 MG/2ML IJ SOLN: 4.0000 mg | Freq: Once | INTRAMUSCULAR | Status: DC

## 2014-12-09 MED ORDER — SODIUM CHLORIDE 0.9 % IV BOLUS (SEPSIS)
2000.0000 mL | Freq: Once | INTRAVENOUS | Status: AC
  Administered 2014-12-09: 2000 mL via INTRAVENOUS

## 2014-12-09 MED ORDER — SODIUM CHLORIDE 0.9 % IV SOLN
INTRAVENOUS | Status: DC
  Administered 2014-12-09: 3.6 [IU]/h via INTRAVENOUS
  Filled 2014-12-09: qty 2.5

## 2014-12-09 MED ORDER — ONDANSETRON HCL 8 MG PO TABS: 8.0000 mg | ORAL_TABLET | Freq: Three times a day (TID) | ORAL | Status: DC | PRN

## 2014-12-09 NOTE — ED Provider Notes (Signed)
CSN: [id number]     Arrival date & time 12/09/14  0424 History   First MD Initiated Contact with Patient 12/09/14 0557     Chief Complaint  Patient presents with  . Flu-Like Symptoms      (Consider location/radiation/quality/duration/timing/severity/associated sxs/prior Treatment) HPI  This is a 39 year old female with type 2 diabetes. She is here with a two-day history of nausea, vomiting and diarrhea. She has not been able to keep anything on her stomach. She attempted to continue working despite the symptoms but had to leave work due to lightheadedness and weakness. Her symptoms have been moderate to severe. She has had mild abdominal cramping. She has had chills but is not aware of having a fever. She denies a dry mouth. She has attempted to drink fluids without relief due to vomiting. She was given Zofran IV prior to my evaluation with relief of her nausea. She states she has not been taking her insulin because she was afraid of hypoglycemia given that she has not been able to eat or drink. Her sugar was noted to be 460 on arrival.  Past Medical History  Diagnosis Date  . Asthma   . Diabetes mellitus   . Hyperlipidemia   . Seasonal allergies    Past Surgical History  Procedure Laterality Date  . Tubal ligation    . Cesarean section     Family History  Problem Relation Age of Onset  . Stroke Mother   . Diabetes Father   . Diabetes Mother   . Diabetes Maternal Grandmother   . Diabetes Maternal Grandfather   . Hypertension Mother   . Hypertension Father   . Cancer Paternal Grandfather     Prostate  . Aneurysm Mother    History  Substance Use Topics  . Smoking status: Current Some Day Smoker -- 0.30 packs/day for 1 years    Types: Cigarettes    Last Attempt to Quit: 07/15/2010  . Smokeless tobacco: Not on file  . Alcohol Use: 0.0 oz/week    0 Not specified per week     Comment: once every 2 weeks   OB History    No data available     Review of Systems  All other  systems reviewed and are negative.   Allergies  Review of patient's allergies indicates no known allergies.  Home Medications   Prior to Admission medications   Medication Sig Start Date End Date Taking? Authorizing Provider  albuterol (PROVENTIL HFA;VENTOLIN HFA) 108 (90 BASE) MCG/ACT inhaler Inhale 2 puffs into the lungs every 6 (six) hours as needed for wheezing or shortness of breath. 08/05/14   Blain Pais, MD  benzonatate (TESSALON) 100 MG capsule Take 1 capsule (100 mg total) by mouth 3 (three) times daily as needed for cough. Patient not taking: Reported on 10/13/2014 08/05/14   Blain Pais, MD  cetirizine (ZYRTEC) 10 MG tablet Take 1 tablet (10 mg total) by mouth daily. Patient taking differently: Take 10 mg by mouth daily as needed for allergies.  08/15/14   Juluis Mire, MD  docusate sodium (COLACE) 100 MG capsule Take 1 capsule (100 mg total) by mouth 2 (two) times daily. Patient taking differently: Take 100 mg by mouth 2 (two) times daily as needed for mild constipation.  08/05/14   Blain Pais, MD  ferrous sulfate 325 (65 FE) MG tablet Take 1 tablet (325 mg total) by mouth 3 (three) times daily with meals. Patient taking differently: Take 325 mg by mouth daily with  breakfast.  08/05/14 08/05/15  Blain Pais, MD  fluticasone (FLONASE) 50 MCG/ACT nasal spray Place 2 sprays into both nostrils daily. Patient taking differently: Place 2 sprays into both nostrils daily as needed for allergies.  08/15/14   Juluis Mire, MD  gabapentin (NEURONTIN) 300 MG capsule Take 1 capsule on 1st day, then 2 capsules on 2nd day, then 3 capsules daily Patient not taking: Reported on 10/13/2014 09/19/14 09/20/15  Juluis Mire, MD  insulin aspart protamine- aspart (NOVOLOG MIX 70/30) (70-30) 100 UNIT/ML injection Inject 20-25 Units into the skin 2 (two) times daily with a meal.     Historical Provider, MD  lisinopril (PRINIVIL,ZESTRIL) 5 MG tablet Take 1 tablet (5 mg total)  by mouth daily. Patient not taking: Reported on 10/13/2014 08/15/14   Juluis Mire, MD  metFORMIN (GLUCOPHAGE) 500 MG tablet Take 1 tablet (500 mg total) by mouth 2 (two) times daily with a meal. 08/15/14   Juluis Mire, MD  pravastatin (PRAVACHOL) 40 MG tablet Take 1 tablet (40 mg total) by mouth daily. Patient not taking: Reported on 10/13/2014 08/15/14   Juluis Mire, MD  traMADol (ULTRAM) 50 MG tablet Take 1 tablet (50 mg total) by mouth every 6 (six) hours as needed. 10/13/14   Antonietta Breach, PA-C   BP 116/63 mmHg  Pulse 103  Temp(Src) 98.3 F (36.8 C) (Oral)  Resp 14  Ht 5\' 5"  (1.651 m)  Wt 206 lb (93.441 kg)  BMI 34.28 kg/m2  SpO2 99%  LMP 12/09/2014   Physical Exam  General: Well-developed, well-nourished female in no acute distress; appearance consistent with age of record HENT: normocephalic; atraumatic Eyes: pupils equal, round and reactive to light; extraocular muscles intact Neck: supple Heart: regular rate and rhythm; tachycardic Lungs: clear to auscultation bilaterally Abdomen: soft; nondistended; mild diffuse tenderness; no masses or hepatosplenomegaly; bowel sounds present Extremities: No deformity; full range of motion; pulses normal Neurologic: Awake, alert and oriented; motor function intact in all extremities and symmetric; no facial droop Skin: Warm and dry Psychiatric: Normal mood and affect    ED Course  Procedures (including critical care time)   MDM   Nursing notes and vitals signs, including pulse oximetry, reviewed.  Summary of this visit's results, reviewed by myself:  Labs:  Results for orders placed or performed during the hospital encounter of 12/09/14 (from the past 24 hour(s))  CBG monitoring, ED     Status: Abnormal   Collection Time: 12/09/14  4:31 AM  Result Value Ref Range   Glucose-Capillary 460 (H) 70 - 99 mg/dL  CBC with Differential     Status: Abnormal   Collection Time: 12/09/14  4:50 AM  Result Value Ref Range   WBC 6.9  4.0 - 10.5 K/uL   RBC 3.75 (L) 3.87 - 5.11 MIL/uL   Hemoglobin 8.8 (L) 12.0 - 15.0 g/dL   HCT 27.4 (L) 36.0 - 46.0 %   MCV 73.1 (L) 78.0 - 100.0 fL   MCH 23.5 (L) 26.0 - 34.0 pg   MCHC 32.1 30.0 - 36.0 g/dL   RDW 17.1 (H) 11.5 - 15.5 %   Platelets 238 150 - 400 K/uL   Neutrophils Relative % 68 43 - 77 %   Neutro Abs 4.7 1.7 - 7.7 K/uL   Lymphocytes Relative 25 12 - 46 %   Lymphs Abs 1.7 0.7 - 4.0 K/uL   Monocytes Relative 5 3 - 12 %   Monocytes Absolute 0.3 0.1 - 1.0 K/uL   Eosinophils Relative 2 0 -  5 %   Eosinophils Absolute 0.1 0.0 - 0.7 K/uL   Basophils Relative 0 0 - 1 %   Basophils Absolute 0.0 0.0 - 0.1 K/uL  Comprehensive metabolic panel     Status: Abnormal   Collection Time: 12/09/14  4:50 AM  Result Value Ref Range   Sodium 130 (L) 135 - 145 mmol/L   Potassium 4.3 3.5 - 5.1 mmol/L   Chloride 96 96 - 112 mmol/L   CO2 25 19 - 32 mmol/L   Glucose, Bld 527 (H) 70 - 99 mg/dL   BUN 24 (H) 6 - 23 mg/dL   Creatinine, Ser 1.32 (H) 0.50 - 1.10 mg/dL   Calcium 8.4 8.4 - 10.5 mg/dL   Total Protein 6.6 6.0 - 8.3 g/dL   Albumin 3.1 (L) 3.5 - 5.2 g/dL   AST 17 0 - 37 U/L   ALT 15 0 - 35 U/L   Alkaline Phosphatase 85 39 - 117 U/L   Total Bilirubin 0.6 0.3 - 1.2 mg/dL   GFR calc non Af Amer 50 (L) >90 mL/min   GFR calc Af Amer 58 (L) >90 mL/min   Anion gap 9 5 - 15  CBG monitoring, ED     Status: Abnormal   Collection Time: 12/09/14  6:15 AM  Result Value Ref Range   Glucose-Capillary 419 (H) 70 - 99 mg/dL  I-Stat venous blood gas, ED     Status: Abnormal   Collection Time: 12/09/14  6:43 AM  Result Value Ref Range   pH, Ven 7.343 (H) 7.250 - 7.300   pCO2, Ven 43.9 (L) 45.0 - 50.0 mmHg   pO2, Ven 69.0 (H) 30.0 - 45.0 mmHg   Bicarbonate 23.9 20.0 - 24.0 mEq/L   TCO2 25 0 - 100 mmol/L   O2 Saturation 92.0 %   Acid-base deficit 2.0 0.0 - 2.0 mmol/L   Patient temperature 37.0 C    Sample type VENOUS   CBG monitoring, ED     Status: Abnormal   Collection Time: 12/09/14   7:13 AM  Result Value Ref Range   Glucose-Capillary 344 (H) 70 - 99 mg/dL   7:45 AM Patient has received 2L NS bolus. On insulin drip, will discharge when sugar improved.   Wynetta Fines, MD 12/09/14 [phone]

## 2014-12-09 NOTE — ED Notes (Signed)
Pt able to tolerate po liquids. Pt denies N/V.

## 2014-12-09 NOTE — ED Notes (Signed)
Per EMS, the patient has flulike symptoms for 1 day with emesis tonight, chills, and body aches while at work. She reports being around sick coworkers recently.  She is diabetic and not taking insulin because she can't hold food down. EMS measured CBG of 504, bp 122/78, p 82, rr 16.

## 2014-12-09 NOTE — ED Notes (Signed)
Discussed cbg of 460 and nausea, vomiting with Dr. Shayne Alken. MD acknowledges, no new orders at this time.

## 2014-12-09 NOTE — ED Notes (Signed)
cbg is 419.

## 2014-12-09 NOTE — ED Notes (Signed)
Informed patient urine sample is needed.

## 2014-12-11 ENCOUNTER — Encounter: Payer: Self-pay | Admitting: Internal Medicine

## 2014-12-23 ENCOUNTER — Encounter: Payer: Self-pay | Admitting: *Deleted

## 2015-01-04 ENCOUNTER — Emergency Department (HOSPITAL_COMMUNITY)
Admission: EM | Admit: 2015-01-04 | Discharge: 2015-01-04 | Disposition: A | Discharge status: Home / Self Care | Payer: Self-pay | Attending: Emergency Medicine | Admitting: Emergency Medicine

## 2015-01-04 ENCOUNTER — Encounter (HOSPITAL_COMMUNITY): Payer: Self-pay | Admitting: *Deleted

## 2015-01-04 DIAGNOSIS — Z3202 Encounter for pregnancy test, result negative: Secondary | ICD-10-CM | POA: Insufficient documentation

## 2015-01-04 DIAGNOSIS — D649 Anemia, unspecified: Secondary | ICD-10-CM

## 2015-01-04 DIAGNOSIS — Z79899 Other long term (current) drug therapy: Secondary | ICD-10-CM | POA: Insufficient documentation

## 2015-01-04 DIAGNOSIS — R531 Weakness: Secondary | ICD-10-CM

## 2015-01-04 DIAGNOSIS — E1165 Type 2 diabetes mellitus with hyperglycemia: Secondary | ICD-10-CM | POA: Insufficient documentation

## 2015-01-04 DIAGNOSIS — J45909 Unspecified asthma, uncomplicated: Secondary | ICD-10-CM | POA: Insufficient documentation

## 2015-01-04 DIAGNOSIS — Z7951 Long term (current) use of inhaled steroids: Secondary | ICD-10-CM | POA: Insufficient documentation

## 2015-01-04 DIAGNOSIS — Z9114 Patient's other noncompliance with medication regimen: Secondary | ICD-10-CM | POA: Insufficient documentation

## 2015-01-04 DIAGNOSIS — R739 Hyperglycemia, unspecified: Secondary | ICD-10-CM

## 2015-01-04 DIAGNOSIS — Z720 Tobacco use: Secondary | ICD-10-CM | POA: Insufficient documentation

## 2015-01-04 DIAGNOSIS — Z794 Long term (current) use of insulin: Secondary | ICD-10-CM | POA: Insufficient documentation

## 2015-01-04 HISTORY — DX: Iron deficiency anemia, unspecified: D50.9

## 2015-01-04 LAB — URINE MICROSCOPIC-ADD ON

## 2015-01-04 LAB — URINALYSIS, ROUTINE W REFLEX MICROSCOPIC
BILIRUBIN URINE: NEGATIVE
Glucose, UA: >1000 mg/dL — AB
Ketones, ur: NEGATIVE mg/dL
LEUKOCYTES UA: NEGATIVE
NITRITE: NEGATIVE
PH: 5.5 (ref 5.0–8.0)
Protein, ur: 100 mg/dL — AB
Specific Gravity, Urine: 1.041 — ABNORMAL HIGH (ref 1.005–1.030)
Urobilinogen, UA: 1 mg/dL (ref 0.0–1.0)

## 2015-01-04 LAB — CBC WITH DIFFERENTIAL/PLATELET
Basophils Absolute: 0 10*3/uL (ref 0.0–0.1)
Basophils Relative: 0 % (ref 0–1)
EOS ABS: 0.2 10*3/uL (ref 0.0–0.7)
EOS PCT: 3 % (ref 0–5)
HCT: 30.1 % — ABNORMAL LOW (ref 36.0–46.0)
HEMOGLOBIN: 9.4 g/dL — AB (ref 12.0–15.0)
LYMPHS ABS: 1.6 10*3/uL (ref 0.7–4.0)
Lymphocytes Relative: 30 % (ref 12–46)
MCH: 22.9 pg — ABNORMAL LOW (ref 26.0–34.0)
MCHC: 31.2 g/dL (ref 30.0–36.0)
MCV: 73.4 fL — ABNORMAL LOW (ref 78.0–100.0)
MONOS PCT: 6 % (ref 3–12)
Monocytes Absolute: 0.3 10*3/uL (ref 0.1–1.0)
NEUTROS ABS: 3.2 10*3/uL (ref 1.7–7.7)
NEUTROS PCT: 61 % (ref 43–77)
PLATELETS: 272 10*3/uL (ref 150–400)
RBC: 4.1 MIL/uL (ref 3.87–5.11)
RDW: 17.5 % — AB (ref 11.5–15.5)
WBC: 5.3 10*3/uL (ref 4.0–10.5)

## 2015-01-04 LAB — CBG MONITORING, ED
GLUCOSE-CAPILLARY: 401 mg/dL — AB (ref 70–99)
GLUCOSE-CAPILLARY: 328 mg/dL — AB (ref 70–99)
Glucose-Capillary: 441 mg/dL — ABNORMAL HIGH (ref 70–99)

## 2015-01-04 LAB — BASIC METABOLIC PANEL
Anion gap: 5 (ref 5–15)
BUN: 16 mg/dL (ref 6–23)
CHLORIDE: 100 mmol/L (ref 96–112)
CO2: 28 mmol/L (ref 19–32)
CREATININE: 1.08 mg/dL (ref 0.50–1.10)
Calcium: 8.5 mg/dL (ref 8.4–10.5)
GFR calc non Af Amer: 64 mL/min — ABNORMAL LOW (ref >90)
GFR, EST AFRICAN AMERICAN: 74 mL/min — AB (ref >90)
Glucose, Bld: 476 mg/dL — ABNORMAL HIGH (ref 70–99)
POTASSIUM: 4.2 mmol/L (ref 3.5–5.1)
Sodium: 133 mmol/L — ABNORMAL LOW (ref 135–145)

## 2015-01-04 LAB — POC URINE PREG, ED: Preg Test, Ur: NEGATIVE

## 2015-01-04 MED ORDER — INSULIN ASPART PROT & ASPART (70-30 MIX) 100 UNIT/ML ~~LOC~~ SUSP
25.0000 [IU] | Freq: Once | SUBCUTANEOUS | Status: DC
  Filled 2015-01-04: qty 10

## 2015-01-04 MED ORDER — INSULIN ASPART 100 UNIT/ML ~~LOC~~ SOLN
8.0000 [IU] | Freq: Once | SUBCUTANEOUS | Status: AC
  Administered 2015-01-04: 8 [IU] via SUBCUTANEOUS
  Filled 2015-01-04: qty 1

## 2015-01-04 NOTE — Discharge Instructions (Signed)
Make sure to take all of your medications as prescribed by your doctor. Follow up with primary care doctor for recheck.   Hyperglycemia Hyperglycemia occurs when the glucose (sugar) in your blood is too high. Hyperglycemia can happen for many reasons, but it most often happens to people who do not know they have diabetes or are not managing their diabetes properly.  CAUSES  Whether you have diabetes or not, there are other causes of hyperglycemia. Hyperglycemia can occur when you have diabetes, but it can also occur in other situations that you might not be as aware of, such as: Diabetes  If you have diabetes and are having problems controlling your blood glucose, hyperglycemia could occur because of some of the following reasons:  Not following your meal plan.  Not taking your diabetes medications or not taking it properly.  Exercising less or doing less activity than you normally do.  Being sick. Pre-diabetes  This cannot be ignored. Before people develop Type 2 diabetes, they almost always have "pre-diabetes." This is when your blood glucose levels are higher than normal, but not yet high enough to be diagnosed as diabetes. Research has shown that some long-term damage to the body, especially the heart and circulatory system, may already be occurring during pre-diabetes. If you take action to manage your blood glucose when you have pre-diabetes, you may delay or prevent Type 2 diabetes from developing. Stress  If you have diabetes, you may be "diet" controlled or on oral medications or insulin to control your diabetes. However, you may find that your blood glucose is higher than usual in the hospital whether you have diabetes or not. This is often referred to as "stress hyperglycemia." Stress can elevate your blood glucose. This happens because of hormones put out by the body during times of stress. If stress has been the cause of your high blood glucose, it can be followed regularly by your  caregiver. That way he/she can make sure your hyperglycemia does not continue to get worse or progress to diabetes. Steroids  Steroids are medications that act on the infection fighting system (immune system) to block inflammation or infection. One side effect can be a rise in blood glucose. Most people can produce enough extra insulin to allow for this rise, but for those who cannot, steroids make blood glucose levels go even higher. It is not unusual for steroid treatments to "uncover" diabetes that is developing. It is not always possible to determine if the hyperglycemia will go away after the steroids are stopped. A special blood test called an A1c is sometimes done to determine if your blood glucose was elevated before the steroids were started. SYMPTOMS  Thirsty.  Frequent urination.  Dry mouth.  Blurred vision.  Tired or fatigue.  Weakness.  Sleepy.  Tingling in feet or leg. DIAGNOSIS  Diagnosis is made by monitoring blood glucose in one or all of the following ways:  A1c test. This is a chemical found in your blood.  Fingerstick blood glucose monitoring.  Laboratory results. TREATMENT  First, knowing the cause of the hyperglycemia is important before the hyperglycemia can be treated. Treatment may include, but is not be limited to:  Education.  Change or adjustment in medications.  Change or adjustment in meal plan.  Treatment for an illness, infection, etc.  More frequent blood glucose monitoring.  Change in exercise plan.  Decreasing or stopping steroids.  Lifestyle changes. HOME CARE INSTRUCTIONS   Test your blood glucose as directed.  Exercise regularly.  Your caregiver will give you instructions about exercise. Pre-diabetes or diabetes which comes on with stress is helped by exercising.  Eat wholesome, balanced meals. Eat often and at regular, fixed times. Your caregiver or nutritionist will give you a meal plan to guide your sugar intake.  Being at  an ideal weight is important. If needed, losing as little as 10 to 15 pounds may help improve blood glucose levels. SEEK MEDICAL CARE IF:   You have questions about medicine, activity, or diet.  You continue to have symptoms (problems such as increased thirst, urination, or weight gain). SEEK IMMEDIATE MEDICAL CARE IF:   You are vomiting or have diarrhea.  Your breath smells fruity.  You are breathing faster or slower.  You are very sleepy or incoherent.  You have numbness, tingling, or pain in your feet or hands.  You have chest pain.  Your symptoms get worse even though you have been following your caregiver's orders.  If you have any other questions or concerns. Document Released: 04/26/2001 Document Revised: 01/23/2012 Document Reviewed: 02/27/2012 [facility] Patient Information 2015 [place], [place]. This information is not intended to replace advice given to you by your health care provider. Make sure you discuss any questions you have with your health care provider.  Anemia, Nonspecific Anemia is a condition in which the concentration of red blood cells or hemoglobin in the blood is below normal. Hemoglobin is a substance in red blood cells that carries oxygen to the tissues of the body. Anemia results in not enough oxygen reaching these tissues.  CAUSES  Common causes of anemia include:   Excessive bleeding. Bleeding may be internal or external. This includes excessive bleeding from periods (in women) or from the intestine.   Poor nutrition.   Chronic kidney, thyroid, and liver disease.  Bone marrow disorders that decrease red blood cell production.  Cancer and treatments for cancer.  HIV, AIDS, and their treatments.  Spleen problems that increase red blood cell destruction.  Blood disorders.  Excess destruction of red blood cells due to infection, medicines, and autoimmune disorders. SIGNS AND SYMPTOMS   Minor weakness.   Dizziness.    Headache.  Palpitations.   Shortness of breath, especially with exercise.   Paleness.  Cold sensitivity.  Indigestion.  Nausea.  Difficulty sleeping.  Difficulty concentrating. Symptoms may occur suddenly or they may develop slowly.  DIAGNOSIS  Additional blood tests are often needed. These help your health care provider determine the best treatment. Your health care provider will check your stool for blood and look for other causes of blood loss.  TREATMENT  Treatment varies depending on the cause of the anemia. Treatment can include:   Supplements of iron, vitamin 123456, or folic acid.   Hormone medicines.   A blood transfusion. This may be needed if blood loss is severe.   Hospitalization. This may be needed if there is significant continual blood loss.   Dietary changes.  Spleen removal. HOME CARE INSTRUCTIONS Keep all follow-up appointments. It often takes many weeks to correct anemia, and having your health care provider check on your condition and your response to treatment is very important. SEEK IMMEDIATE MEDICAL CARE IF:   You develop extreme weakness, shortness of breath, or chest pain.   You become dizzy or have trouble concentrating.  You develop heavy vaginal bleeding.   You develop a rash.   You have bloody or black, tarry stools.   You faint.   You vomit up blood.   You vomit  repeatedly.   You have abdominal pain.  You have a fever or persistent symptoms for more than 2-3 days.   You have a fever and your symptoms suddenly get worse.   You are dehydrated.  MAKE SURE YOU:  Understand these instructions.  Will watch your condition.  Will get help right away if you are not doing well or get worse. Document Released: 12/08/2004 Document Revised: 07/03/2013 Document Reviewed: 04/26/2013 [facility] Patient Information 2015 [place], [place]. This information is not intended to replace advice given to you by your health care  provider. Make sure you discuss any questions you have with your health care provider.

## 2015-01-04 NOTE — ED Notes (Signed)
Declined W/C at D/C and was escorted to lobby by RN. 

## 2015-01-04 NOTE — ED Notes (Signed)
CBG 441

## 2015-01-04 NOTE — ED Notes (Addendum)
Pt took 2 tabs of no dose meds OTC on Thurseday night to help her stay awake at work. Pt reports with in  2-3 hrs she felt nausea and unable to eat. Today pt reports she still feels lightheaded. Pt is A/O x4.

## 2015-01-04 NOTE — ED Notes (Signed)
CBG was 328.

## 2015-01-04 NOTE — ED Provider Notes (Signed)
CSN: [id number]     Arrival date & time 01/04/15  [id number] History  This chart was scribed for non-physician practitioner, Renold Genta, PA-C, working with Francine Graven, DO, by Stephania Fragmin, ED Scribe. This patient was seen in room TR07C/TR07C and the patient's care was started at 9:29 AM.    Chief Complaint  Patient presents with  . Medication Reaction   The history is provided by the patient. No language interpreter was used.     HPI Comments: Jane [name] is a 39 y.o. female who presents to the Emergency Department complaining of a possible medication reaction. Three days ago, patient was going to work and took a NoDoz pill (which contains 200 mg caffeine, per [email or web address]) because she was feeling sleepy. She states she felt fine for about 3 hours until she began to feel severe nausea and dizziness. Patient was unable to eat due to the nausea. She states that her symptoms have improved since, but she states states she still feels moderately nauseated and light-headed, although she thinks that may partly be because she is in the middle of her menstrual period now, which began 2 days ago. She doesn't know what her sugar level is. Patient's last dosage of insulin was yesterday. She is prescribed Metformin but doesn't take them, stating that "it's too much medicine." She reports she watches her diet and walks to work for 20 minutes. She reports a history of anemia, stating that she "has to take iron." No other modifying factors were noted.    Past Medical History  Diagnosis Date  . Asthma   . Diabetes mellitus   . Hyperlipidemia   . Seasonal allergies    Past Surgical History  Procedure Laterality Date  . Tubal ligation    . Cesarean section     Family History  Problem Relation Age of Onset  . Stroke Mother   . Diabetes Father   . Diabetes Mother   . Diabetes Maternal Grandmother   . Diabetes Maternal Grandfather   . Hypertension Mother   . Hypertension Father   . Cancer  Paternal Grandfather     Prostate  . Aneurysm Mother    History  Substance Use Topics  . Smoking status: Current Some Day Smoker -- 0.30 packs/day for 1 years    Types: Cigarettes    Last Attempt to Quit: 07/15/2010  . Smokeless tobacco: Not on file  . Alcohol Use: 0.0 oz/week    0 Standard drinks or equivalent per week     Comment: once every 2 weeks   OB History    No data available     Review of Systems  Gastrointestinal: Positive for nausea.  Neurological: Positive for dizziness and light-headedness.      Allergies  Review of patient's allergies indicates no known allergies.  Home Medications   Prior to Admission medications   Medication Sig Start Date End Date Taking? Authorizing Provider  albuterol (PROVENTIL HFA;VENTOLIN HFA) 108 (90 BASE) MCG/ACT inhaler Inhale 2 puffs into the lungs every 6 (six) hours as needed for wheezing or shortness of breath. 08/05/14   Blain Pais, MD  cetirizine (ZYRTEC) 10 MG tablet Take 1 tablet (10 mg total) by mouth daily. Patient taking differently: Take 10 mg by mouth daily as needed for allergies.  08/15/14   Juluis Mire, MD  docusate sodium (COLACE) 100 MG capsule Take 1 capsule (100 mg total) by mouth 2 (two) times daily. Patient taking differently: Take 100 mg by mouth 2 (two)  times daily as needed for mild constipation.  08/05/14   Blain Pais, MD  ferrous sulfate 325 (65 FE) MG tablet Take 1 tablet (325 mg total) by mouth 3 (three) times daily with meals. Patient taking differently: Take 325 mg by mouth daily with breakfast.  08/05/14 08/05/15  Blain Pais, MD  fluticasone (FLONASE) 50 MCG/ACT nasal spray Place 2 sprays into both nostrils daily. Patient taking differently: Place 2 sprays into both nostrils daily as needed for allergies.  08/15/14   Juluis Mire, MD  insulin aspart protamine- aspart (NOVOLOG MIX 70/30) (70-30) 100 UNIT/ML injection Inject 20-25 Units into the skin 2 (two) times daily with a  meal.     Historical Provider, MD  metFORMIN (GLUCOPHAGE) 500 MG tablet Take 1 tablet (500 mg total) by mouth 2 (two) times daily with a meal. 08/15/14   Juluis Mire, MD  ondansetron (ZOFRAN) 8 MG tablet Take 1 tablet (8 mg total) by mouth every 8 (eight) hours as needed for nausea or vomiting. 12/09/14   Karen Chafe Molpus, MD  traMADol (ULTRAM) 50 MG tablet Take 1 tablet (50 mg total) by mouth every 6 (six) hours as needed. 10/13/14   Antonietta Breach, PA-C   BP 142/77 mmHg  Pulse 88  Temp(Src) 98.7 F (37.1 C) (Oral)  Resp 20  SpO2 100%  LMP 12/09/2014 Physical Exam  Constitutional: She is oriented to person, place, and time. She appears well-developed and well-nourished. No distress.  HENT:  Head: Normocephalic and atraumatic.  Eyes: Conjunctivae and EOM are normal.  Neck: Neck supple. No tracheal deviation present.  Cardiovascular: Normal rate, regular rhythm and normal heart sounds.   Pulmonary/Chest: Effort normal. No respiratory distress. She has no wheezes. She has no rales.  Lungs are clear.  Musculoskeletal: Normal range of motion.  Neurological: She is alert and oriented to person, place, and time.  Skin: Skin is warm and dry.  Psychiatric: She has a normal mood and affect. Her behavior is normal.  Nursing note and vitals reviewed.   ED Course  Procedures (including critical care time)  DIAGNOSTIC STUDIES: Oxygen Saturation is 100% on room air, normal by my interpretation.    COORDINATION OF CARE: 9:35 AM - Discussed treatment plan with pt at bedside which includes blood test and CBG,  and pt agreed to plan.   Labs Review Labs Reviewed  CBC WITH DIFFERENTIAL/PLATELET - Abnormal; Notable for the following:    Hemoglobin 9.4 (*)    HCT 30.1 (*)    MCV 73.4 (*)    MCH 22.9 (*)    RDW 17.5 (*)    All other components within normal limits  BASIC METABOLIC PANEL - Abnormal; Notable for the following:    Sodium 133 (*)    Glucose, Bld 476 (*)    GFR calc non Af Amer 64  (*)    GFR calc Af Amer 74 (*)    All other components within normal limits  URINALYSIS, ROUTINE W REFLEX MICROSCOPIC - Abnormal; Notable for the following:    Color, Urine RED (*)    APPearance CLOUDY (*)    Specific Gravity, Urine 1.041 (*)    Glucose, UA >1000 (*)    Hgb urine dipstick LARGE (*)    Protein, ur 100 (*)    All other components within normal limits  URINE MICROSCOPIC-ADD ON - Abnormal; Notable for the following:    Squamous Epithelial / LPF FEW (*)    All other components within normal limits  CBG  MONITORING, ED - Abnormal; Notable for the following:    Glucose-Capillary 441 (*)    All other components within normal limits  CBG MONITORING, ED - Abnormal; Notable for the following:    Glucose-Capillary 401 (*)    All other components within normal limits  CBG MONITORING, ED - Abnormal; Notable for the following:    Glucose-Capillary 328 (*)    All other components within normal limits  POC URINE PREG, ED   10:52 AM - Repeat CBG is 401.   MDM   Final diagnoses:  Hyperglycemia  Anemia, unspecified anemia type  Weakness     Patient with nausea, weakness, all improving, started 3 days ago. History of hyperglycemia, anemia. Patient admits to noncompliance with her medications. Will check CBC, metabolic panel, CBC, UA and urine pregnancy. Vital signs are normal except for mild hypertension.   11:00 AM Pt received 8 units of regular insulin with cbg down to 400. Will add another 8 units. Pt does not want iv.   12:00 PM Glucose down to 328. Normal anion gap. No ketosis. Pt wanting to go home. Discussed her findings of anemia and hyperglycemia. Instructed to restart metformin, take her insulins, diet, exercise, take iron. Pt voiced understanding  Filed Vitals:   01/04/15 0838  BP: 142/77  Pulse: 88  Temp: 98.7 F (37.1 C)  TempSrc: Oral  Resp: 20  SpO2: 100%   I personally performed the services described in this documentation, which was scribed in my  presence. The recorded information has been reviewed and is accurate.   Renold Genta, PA-C 01/04/15 [place], DO 01/06/15 [id number]

## 2015-01-14 ENCOUNTER — Emergency Department (HOSPITAL_COMMUNITY): Payer: Self-pay

## 2015-01-14 ENCOUNTER — Encounter (HOSPITAL_COMMUNITY): Payer: Self-pay | Admitting: Emergency Medicine

## 2015-01-14 ENCOUNTER — Emergency Department (HOSPITAL_COMMUNITY)
Admission: EM | Admit: 2015-01-14 | Discharge: 2015-01-14 | Disposition: A | Discharge status: Home / Self Care | Payer: Self-pay | Attending: Emergency Medicine | Admitting: Emergency Medicine

## 2015-01-14 DIAGNOSIS — R110 Nausea: Secondary | ICD-10-CM | POA: Insufficient documentation

## 2015-01-14 DIAGNOSIS — Z720 Tobacco use: Secondary | ICD-10-CM | POA: Insufficient documentation

## 2015-01-14 DIAGNOSIS — J45901 Unspecified asthma with (acute) exacerbation: Secondary | ICD-10-CM | POA: Insufficient documentation

## 2015-01-14 DIAGNOSIS — Z79899 Other long term (current) drug therapy: Secondary | ICD-10-CM | POA: Insufficient documentation

## 2015-01-14 DIAGNOSIS — D509 Iron deficiency anemia, unspecified: Secondary | ICD-10-CM | POA: Insufficient documentation

## 2015-01-14 DIAGNOSIS — J209 Acute bronchitis, unspecified: Secondary | ICD-10-CM

## 2015-01-14 DIAGNOSIS — Z9119 Patient's noncompliance with other medical treatment and regimen: Secondary | ICD-10-CM | POA: Insufficient documentation

## 2015-01-14 DIAGNOSIS — R000 Tachycardia, unspecified: Secondary | ICD-10-CM | POA: Insufficient documentation

## 2015-01-14 DIAGNOSIS — E11319 Type 2 diabetes mellitus with unspecified diabetic retinopathy without macular edema: Secondary | ICD-10-CM | POA: Insufficient documentation

## 2015-01-14 DIAGNOSIS — Z8742 Personal history of other diseases of the female genital tract: Secondary | ICD-10-CM | POA: Insufficient documentation

## 2015-01-14 DIAGNOSIS — Z8669 Personal history of other diseases of the nervous system and sense organs: Secondary | ICD-10-CM | POA: Insufficient documentation

## 2015-01-14 DIAGNOSIS — M791 Myalgia: Secondary | ICD-10-CM | POA: Insufficient documentation

## 2015-01-14 DIAGNOSIS — Z794 Long term (current) use of insulin: Secondary | ICD-10-CM | POA: Insufficient documentation

## 2015-01-14 DIAGNOSIS — Z7951 Long term (current) use of inhaled steroids: Secondary | ICD-10-CM | POA: Insufficient documentation

## 2015-01-14 MED ORDER — ALBUTEROL SULFATE HFA 108 (90 BASE) MCG/ACT IN AERS
2.0000 | INHALATION_SPRAY | Freq: Once | RESPIRATORY_TRACT | Status: AC
  Administered 2015-01-14: 2 via RESPIRATORY_TRACT
  Filled 2015-01-14: qty 6.7

## 2015-01-14 MED ORDER — GUAIFENESIN 100 MG/5ML PO LIQD
100.0000 mg | ORAL | Status: DC | PRN
Start: 2015-01-14 — End: 2015-06-09

## 2015-01-14 MED ORDER — BENZONATATE 100 MG PO CAPS: 100.0000 mg | ORAL_CAPSULE | Freq: Three times a day (TID) | ORAL | Status: DC

## 2015-01-14 MED ORDER — SALINE SPRAY 0.65 % NA SOLN: 1.0000 | NASAL | Status: DC | PRN

## 2015-01-14 MED ORDER — IBUPROFEN 600 MG PO TABS: 600.0000 mg | ORAL_TABLET | Freq: Four times a day (QID) | ORAL | Status: DC | PRN

## 2015-01-14 NOTE — ED Notes (Signed)
PA at the bedside.

## 2015-01-14 NOTE — ED Notes (Addendum)
Pt. Reports cough with productive green sputum and chest pain ("due to coughing"). Reports hot flashes but has not checked temperature. Alert and oriented x4. Reports SOB. Airway intact, does no appear in distress.

## 2015-01-14 NOTE — ED Provider Notes (Signed)
CSN: [id number]     Arrival date & time 01/14/15  1514 History  This chart was scribed for Noland Fordyce, PA-C, working with [place]. Alvino Chapel, MD by Steva Colder, ED Scribe. The patient was seen in room TR11C/TR11C at 5:26 PM.    Chief Complaint  Patient presents with  . Cough     The history is provided by the patient. No language interpreter was used.    HPI Comments: Jane [name] is a 39 y.o. female with a medical hx of asthma and HTN who presents to the Emergency Department complaining of productive cough onset 3 days. Pt notes that the cough is productive of green sputum. When the pt has a cough, she typically receives a cough syrup. She states that she is having associated symptoms of SOB, nausea, and generalized body aches. Pt has not used her inhaler at home for her symptoms. She states that she has tried mucinex and ibuprofen with mild to moderate relief for her symptoms. She denies vomiting, diarrhea, and any other symptoms. Denies sick contacts. Pt has a family doctor at the outpatient clinic. Pt didn't inform her PCP that she was sick. Pt has not had anything to eat or drink today.   Past Medical History  Diagnosis Date  . Asthma   . Hyperlipidemia   . Seasonal allergies   . Noncompliance with medications   . Peripheral neuropathy   . Diabetic retinopathy   . Iron deficiency anemia   . Diabetes mellitus     uncontrolled  . Menorrhagia    Past Surgical History  Procedure Laterality Date  . Tubal ligation    . Cesarean section     Family History  Problem Relation Age of Onset  . Stroke Mother   . Diabetes Father   . Diabetes Mother   . Diabetes Maternal Grandmother   . Diabetes Maternal Grandfather   . Hypertension Mother   . Hypertension Father   . Cancer Paternal Grandfather     Prostate  . Aneurysm Mother    History  Substance Use Topics  . Smoking status: Current Some Day Smoker -- 0.30 packs/day for 1 years    Types: Cigarettes    Last Attempt  to Quit: 07/15/2010  . Smokeless tobacco: Not on file  . Alcohol Use: 0.0 oz/week    0 Standard drinks or equivalent per week     Comment: once every 2 weeks   OB History    No data available     Review of Systems  Constitutional:       Generalized body aches.   Respiratory: Positive for cough and shortness of breath.   Gastrointestinal: Positive for nausea. Negative for vomiting and diarrhea.      Allergies  Review of patient's allergies indicates no known allergies.  Home Medications   Prior to Admission medications   Medication Sig Start Date End Date Taking? Authorizing Provider  albuterol (PROVENTIL HFA;VENTOLIN HFA) 108 (90 BASE) MCG/ACT inhaler Inhale 2 puffs into the lungs every 6 (six) hours as needed for wheezing or shortness of breath. 08/05/14   Blain Pais, MD  benzonatate (TESSALON) 100 MG capsule Take 1 capsule (100 mg total) by mouth every 8 (eight) hours. 01/14/15   Noland Fordyce, PA-C  cetirizine (ZYRTEC) 10 MG tablet Take 1 tablet (10 mg total) by mouth daily. Patient taking differently: Take 10 mg by mouth daily as needed for allergies.  08/15/14   Juluis Mire, MD  docusate sodium (COLACE) 100 MG  capsule Take 1 capsule (100 mg total) by mouth 2 (two) times daily. Patient taking differently: Take 100 mg by mouth 2 (two) times daily as needed for mild constipation.  08/05/14   Blain Pais, MD  ferrous sulfate 325 (65 FE) MG tablet Take 1 tablet (325 mg total) by mouth 3 (three) times daily with meals. Patient taking differently: Take 325 mg by mouth daily with breakfast.  08/05/14 08/05/15  Blain Pais, MD  fluticasone (FLONASE) 50 MCG/ACT nasal spray Place 2 sprays into both nostrils daily. Patient taking differently: Place 2 sprays into both nostrils daily as needed for allergies.  08/15/14   Juluis Mire, MD  guaiFENesin (ROBITUSSIN) 100 MG/5ML liquid Take 5-10 mLs (100-200 mg total) by mouth every 4 (four) hours as needed for cough.  01/14/15   Noland Fordyce, PA-C  ibuprofen (ADVIL,MOTRIN) 600 MG tablet Take 1 tablet (600 mg total) by mouth every 6 (six) hours as needed. 01/14/15   Noland Fordyce, PA-C  insulin aspart protamine- aspart (NOVOLOG MIX 70/30) (70-30) 100 UNIT/ML injection Inject 20-25 Units into the skin 2 (two) times daily with a meal.     Historical Provider, MD  metFORMIN (GLUCOPHAGE) 500 MG tablet Take 1 tablet (500 mg total) by mouth 2 (two) times daily with a meal. 08/15/14   Juluis Mire, MD  ondansetron (ZOFRAN) 8 MG tablet Take 1 tablet (8 mg total) by mouth every 8 (eight) hours as needed for nausea or vomiting. 12/09/14   Karen Chafe Molpus, MD  sodium chloride (OCEAN) 0.65 % SOLN nasal spray Place 1 spray into both nostrils as needed for congestion. 01/14/15   Noland Fordyce, PA-C  traMADol (ULTRAM) 50 MG tablet Take 1 tablet (50 mg total) by mouth every 6 (six) hours as needed. 10/13/14   Antonietta Breach, PA-C   BP 130/104 mmHg  Pulse 103  Temp(Src) 99.7 F (37.6 C)  Resp 18  SpO2 100%  LMP 01/07/2015  Physical Exam  Constitutional: She is oriented to person, place, and time. She appears well-developed and well-nourished.  HENT:  Head: Normocephalic and atraumatic.  Eyes: EOM are normal.  Neck: Normal range of motion.  Cardiovascular: Regular rhythm.  Tachycardia present.   Mildly tachycardic  Pulmonary/Chest: Effort normal and breath sounds normal. No respiratory distress. She has no wheezes. She has no rales.  Clear with intermittent productive cough  Abdominal: Soft. There is no tenderness.  Musculoskeletal: Normal range of motion.  Neurological: She is alert and oriented to person, place, and time.  Skin: Skin is warm and dry.  Psychiatric: She has a normal mood and affect. Her behavior is normal.  Nursing note and vitals reviewed.   ED Course  Procedures (including critical care time) DIAGNOSTIC STUDIES: Oxygen Saturation is 100% on room air, normal by my interpretation.    COORDINATION OF  CARE: 5:31 PM-Discussed treatment plan which includes tessalon, ibuprofen, tylenol, inhaler refill, CXR, nasal saline with pt at bedside and pt agreed to plan.   5:55 PM- Upon discharge, pt blood pressure is 175/100,  Medical records indicate pt has hx of HTN, however, the pt is not currently on any medications for it. Encouraged pt to f/u with her PCP for recheck of URI symptoms if not improving in 3-4 days but also for ongoing healthcare needs including her HTN. Return precautions provided. Pt verbalized understanding and agreement with tx plan.   Labs Review Labs Reviewed - No data to display  Imaging Review Dg Chest 2 View  01/14/2015  CLINICAL DATA:  Productive cough, shortness of breath, nausea and chest pain.  EXAM: CHEST  2 VIEW  COMPARISON:  03/26/2014  FINDINGS: Mild peribronchial thickening. Heart and mediastinal contours are within normal limits. No focal opacities or effusions. No acute bony abnormality.  IMPRESSION: Mild bronchitic changes.   Electronically Signed   By: Rolm Baptise M.D.   On: 01/14/2015 16:14     EKG Interpretation None      MDM   Final diagnoses:  Acute bronchitis, unspecified organism    I personally performed the services described in this documentation, which was scribed in my presence. The recorded information has been reviewed and is accurate.    Noland Fordyce, PA-C 01/15/15 [place] Alvino Chapel, MD 01/15/15 [phone]

## 2015-01-22 ENCOUNTER — Encounter: Payer: Self-pay | Admitting: *Deleted

## 2015-02-02 ENCOUNTER — Encounter: Payer: Self-pay | Admitting: Obstetrics & Gynecology

## 2015-02-09 NOTE — Addendum Note (Signed)
Addended by: Hulan Fray on: 02/09/2015 05:48 PM   Modules accepted: Orders

## 2015-06-09 ENCOUNTER — Encounter: Payer: Self-pay | Admitting: Internal Medicine

## 2015-06-09 ENCOUNTER — Encounter: Payer: Self-pay | Admitting: Dietician

## 2015-06-09 ENCOUNTER — Ambulatory Visit (INDEPENDENT_AMBULATORY_CARE_PROVIDER_SITE_OTHER): Payer: Self-pay | Admitting: Internal Medicine

## 2015-06-09 VITALS — BP 161/80 | HR 88 | Temp 98.2°F | Wt 209.8 lb

## 2015-06-09 DIAGNOSIS — E11339 Type 2 diabetes mellitus with moderate nonproliferative diabetic retinopathy without macular edema: Secondary | ICD-10-CM

## 2015-06-09 DIAGNOSIS — E785 Hyperlipidemia, unspecified: Secondary | ICD-10-CM

## 2015-06-09 DIAGNOSIS — E113399 Type 2 diabetes mellitus with moderate nonproliferative diabetic retinopathy without macular edema, unspecified eye: Secondary | ICD-10-CM

## 2015-06-09 DIAGNOSIS — IMO0002 Reserved for concepts with insufficient information to code with codable children: Secondary | ICD-10-CM

## 2015-06-09 DIAGNOSIS — D509 Iron deficiency anemia, unspecified: Secondary | ICD-10-CM

## 2015-06-09 DIAGNOSIS — E1142 Type 2 diabetes mellitus with diabetic polyneuropathy: Secondary | ICD-10-CM

## 2015-06-09 DIAGNOSIS — R809 Proteinuria, unspecified: Secondary | ICD-10-CM

## 2015-06-09 DIAGNOSIS — E1122 Type 2 diabetes mellitus with diabetic chronic kidney disease: Secondary | ICD-10-CM

## 2015-06-09 DIAGNOSIS — E669 Obesity, unspecified: Secondary | ICD-10-CM

## 2015-06-09 DIAGNOSIS — I129 Hypertensive chronic kidney disease with stage 1 through stage 4 chronic kidney disease, or unspecified chronic kidney disease: Secondary | ICD-10-CM

## 2015-06-09 DIAGNOSIS — I10 Essential (primary) hypertension: Secondary | ICD-10-CM

## 2015-06-09 DIAGNOSIS — Z6834 Body mass index (BMI) 34.0-34.9, adult: Secondary | ICD-10-CM

## 2015-06-09 DIAGNOSIS — Z0000 Encounter for general adult medical examination without abnormal findings: Secondary | ICD-10-CM

## 2015-06-09 DIAGNOSIS — E1165 Type 2 diabetes mellitus with hyperglycemia: Secondary | ICD-10-CM

## 2015-06-09 DIAGNOSIS — N182 Chronic kidney disease, stage 2 (mild): Secondary | ICD-10-CM

## 2015-06-09 LAB — COMPLETE METABOLIC PANEL WITH GFR
ALBUMIN: 3.3 g/dL — AB (ref 3.6–5.1)
ALT: 14 U/L (ref 6–29)
AST: 15 U/L (ref 10–30)
Alkaline Phosphatase: 92 U/L (ref 33–115)
BILIRUBIN TOTAL: 0.3 mg/dL (ref 0.2–1.2)
BUN: 16 mg/dL (ref 7–25)
CO2: 27 mEq/L (ref 20–31)
Calcium: 8.7 mg/dL (ref 8.6–10.2)
Chloride: 98 mEq/L (ref 98–110)
Creat: 1.07 mg/dL (ref 0.50–1.10)
GFR, Est African American: 75 mL/min (ref >=60)
GFR, Est Non African American: 65 mL/min (ref >=60)
Glucose, Bld: 357 mg/dL — ABNORMAL HIGH (ref 65–99)
Potassium: 4.3 mEq/L (ref 3.5–5.3)
SODIUM: 135 meq/L (ref 135–146)
TOTAL PROTEIN: 6.6 g/dL (ref 6.1–8.1)

## 2015-06-09 LAB — ANEMIA PANEL
%SAT: 6 % — ABNORMAL LOW (ref 20–55)
ABS Retic: 73.4 10*3/uL (ref 19.0–186.0)
FOLATE: 9.5 ng/mL
Ferritin: 16 ng/mL (ref 10–291)
IRON: 29 ug/dL — AB (ref 42–145)
RBC.: 4.08 MIL/uL (ref 3.87–5.11)
Retic Ct Pct: 1.8 % (ref 0.4–2.3)
TIBC: 451 ug/dL (ref 250–470)
UIBC: 422 ug/dL — AB (ref 125–400)
VITAMIN B 12: 675 pg/mL (ref 211–911)

## 2015-06-09 LAB — POCT GLYCOSYLATED HEMOGLOBIN (HGB A1C): Hemoglobin A1C: >14

## 2015-06-09 LAB — GLUCOSE, CAPILLARY: GLUCOSE-CAPILLARY: 374 mg/dL — AB (ref 65–99)

## 2015-06-09 LAB — TSH: TSH: 0.986 u[IU]/mL (ref 0.350–4.500)

## 2015-06-09 LAB — CBC
HEMATOCRIT: 31.3 % — AB (ref 36.0–46.0)
HEMOGLOBIN: 9.8 g/dL — AB (ref 12.0–15.0)
MCH: 24 pg — ABNORMAL LOW (ref 26.0–34.0)
MCHC: 31.3 g/dL (ref 30.0–36.0)
MCV: 76.7 fL — AB (ref 78.0–100.0)
MPV: 8.6 fL (ref 8.6–12.4)
Platelets: 331 10*3/uL (ref 150–400)
RBC: 4.08 MIL/uL (ref 3.87–5.11)
RDW: 16.2 % — ABNORMAL HIGH (ref 11.5–15.5)
WBC: 5.5 10*3/uL (ref 4.0–10.5)

## 2015-06-09 LAB — LIPID PANEL
Cholesterol: 243 mg/dL — ABNORMAL HIGH (ref 125–200)
HDL: 65 mg/dL (ref >=46)
LDL Cholesterol: 146 mg/dL — ABNORMAL HIGH (ref <130)
Total CHOL/HDL Ratio: 3.7 Ratio (ref <=5.0)
Triglycerides: 158 mg/dL — ABNORMAL HIGH (ref <150)
VLDL: 32 mg/dL — ABNORMAL HIGH (ref <30)

## 2015-06-09 LAB — HM DIABETES EYE EXAM

## 2015-06-09 MED ORDER — ATORVASTATIN CALCIUM 40 MG PO TABS: 40.0000 mg | ORAL_TABLET | Freq: Every day | ORAL | Status: DC

## 2015-06-09 MED ORDER — GABAPENTIN 300 MG PO CAPS: 300.0000 mg | ORAL_CAPSULE | Freq: Three times a day (TID) | ORAL | Status: DC

## 2015-06-09 MED ORDER — FERROUS SULFATE 325 (65 FE) MG PO TABS: 325.0000 mg | ORAL_TABLET | Freq: Every day | ORAL | Status: DC

## 2015-06-09 MED ORDER — INSULIN ASPART PROT & ASPART (70-30 MIX) 100 UNIT/ML ~~LOC~~ SUSP: 22.0000 [IU] | Freq: Two times a day (BID) | SUBCUTANEOUS | Status: DC

## 2015-06-09 MED ORDER — LISINOPRIL 10 MG PO TABS: 10.0000 mg | ORAL_TABLET | Freq: Every day | ORAL | Status: DC

## 2015-06-09 NOTE — Assessment & Plan Note (Addendum)
Assessment: Pt with moderately well-controlled hypertension non-compliant with one-class (ACEi) anti-hypertensive therapy who presents with blood pressure of 161/80.  Plan: -BP 161/80 not at goal <140/90 due to noncompliance  -Restart lisinopril 10 mg daily in setting of proteinuria -Obtain TSH and CMP

## 2015-06-09 NOTE — Assessment & Plan Note (Addendum)
Assessment: Pt with last A1c of 13.9 on 08/15/14 non-compliant with insulin and oral hypoglycemic therapy with no recent symptomatic hypoglycemia who presents with blood glucose of 374 with worsened A1c >14.   Plan:  -A1c >14 not at goal <7, restart Novolog 70/30 22 U BID, pt given 30-day sample. Discontinue metformin due to GI upset. Pt instructed to return in 2 weeks with glucose meter for further insulin adjustment.  -BP 161/80 not at goal<140/90, restart lisinopril 10 mg daily in setting of proteinuria and CKD Stage 2 -Obtain annual lipid panel, last LDL 140 not at goal <100, start atorvastatin 40 mg daily  -Perform annual eye exam today, last one on 10/28/13 with b/l moderate NPDR  -Last annual urine microalbumin test on 08/05/14 with 329 mg of proteinuria, restart lisinopril 10 mg daily  -Perform annual foot exam today -Prescribe gabapentin 300 mg TID for chronic peripheral neuropathy   -BMI 34.91 not at goal <30, encourage weight loss -Pt given work note to take unscheduled breaks if experiences dizziness or lightheadedness with standing in setting of hyperglycemia

## 2015-06-09 NOTE — Assessment & Plan Note (Signed)
Assessment: Pt is a menstruating woman with iron-deficiency anemia most likely due to menorrhagia with no evidence of uterine fibroids on last [place] on 11/28/10 non-compliant with oral iron therapy with last ferritin of 16 on 10/25/13 who presents with no active bleeding or hemodynamic instability.   Plan:  -Obtain CBC w/o diff and anemia panel  -Restart ferrous sulfate 325 mg daily, consider IV iron therapy if noncompliant  -Pt did not attend gynecology appointment on 10/13/14 for further evaluation of etiology of menorrhagia, declined referral today

## 2015-06-09 NOTE — Progress Notes (Signed)
Patient ID: Jane [name], female   DOB: [birth year], 39 y.o.   MRN: [id number]    Subjective:   Patient ID: Jane [name] female   DOB: 10/11/1976 39 y.o.   MRN: [id number]  HPI: [name] is a 39 y.o. pleasant woman with past medical history of uncontrolled Type II DM, asthma, hyperlipidemia, iron-deficiency anemia, and allergic rhinitis who presents with chief complaint of hyperglycemia symptoms.      She reports having nausea with 1 episode of NBNB emesis one day ago in addition to lightheadedness with standing and sweating. She denies syncope. She did not check her blood sugar when she had these symptoms. At work she is standing all day and would like a work note stating she can take unscheduled breaks in the event of lightheadedness or dizziness with standing.   Her last A1c was 13.9 on 08/15/14. She has not been taking Novolog (70/30) 22 U BID or metformin (due to GI upset) since January of this year. She reports she no longer has medicaid and would need to pay out of pocket for insulin but is currently employed. She has not been checking her blood glucose. She denies symptomatic hypoglycemia. She continues to have peripheral neuropathy and did not start gabapentin that was prescribed at last visit. She has blurry vision but denies polydipsia, polyuria, polyphagia, or foot injury/ulcer. She continues to follow a healthy diet and is active at her job. She has lost 2 lb since last visit 8 months ago.   She is non-compliant with taking lisinopril for hypertension. She has headache and lightheadedness but denies chest pain, or LE edema.   She is non-compliant with taking pravastatin for hyperlipidemia.   She has history of iron deficency anemia thought be due to menorrhagia with last ferritin of 16 on 10/25/13. She was scheduled to see gynecology November but never attended the appointment. She continues to have heavy menstrual bleeding. She is no longer taking iron  pills. She reports craving for starch and dirt.       Past Medical History  Diagnosis Date  . Asthma   . Hyperlipidemia   . Seasonal allergies   . Noncompliance with medications   . Peripheral neuropathy   . Diabetic retinopathy   . Iron deficiency anemia   . Diabetes mellitus     uncontrolled  . Menorrhagia    Current Outpatient Prescriptions  Medication Sig Dispense Refill  . albuterol (PROVENTIL HFA;VENTOLIN HFA) 108 (90 BASE) MCG/ACT inhaler Inhale 2 puffs into the lungs every 6 (six) hours as needed for wheezing or shortness of breath. 1 Inhaler 2  . benzonatate (TESSALON) 100 MG capsule Take 1 capsule (100 mg total) by mouth every 8 (eight) hours. 21 capsule 0  . cetirizine (ZYRTEC) 10 MG tablet Take 1 tablet (10 mg total) by mouth daily. (Patient taking differently: Take 10 mg by mouth daily as needed for allergies. ) 90 tablet 3  . docusate sodium (COLACE) 100 MG capsule Take 1 capsule (100 mg total) by mouth 2 (two) times daily. (Patient taking differently: Take 100 mg by mouth 2 (two) times daily as needed for mild constipation. ) 60 capsule 3  . ferrous sulfate 325 (65 FE) MG tablet Take 1 tablet (325 mg total) by mouth 3 (three) times daily with meals. (Patient taking differently: Take 325 mg by mouth daily with breakfast. ) 90 tablet 3  . fluticasone (FLONASE) 50 MCG/ACT nasal spray Place 2 sprays into both nostrils daily. (Patient taking differently: Place  2 sprays into both nostrils daily as needed for allergies. ) 16 g 2  . guaiFENesin (ROBITUSSIN) 100 MG/5ML liquid Take 5-10 mLs (100-200 mg total) by mouth every 4 (four) hours as needed for cough. 60 mL 0  . ibuprofen (ADVIL,MOTRIN) 600 MG tablet Take 1 tablet (600 mg total) by mouth every 6 (six) hours as needed. 30 tablet 0  . insulin aspart protamine- aspart (NOVOLOG MIX 70/30) (70-30) 100 UNIT/ML injection Inject 20-25 Units into the skin 2 (two) times daily with a meal.     . metFORMIN (GLUCOPHAGE) 500 MG tablet  Take 1 tablet (500 mg total) by mouth 2 (two) times daily with a meal. 60 tablet 3  . ondansetron (ZOFRAN) 8 MG tablet Take 1 tablet (8 mg total) by mouth every 8 (eight) hours as needed for nausea or vomiting. 10 tablet 0  . sodium chloride (OCEAN) 0.65 % SOLN nasal spray Place 1 spray into both nostrils as needed for congestion. 1 Bottle 0  . traMADol (ULTRAM) 50 MG tablet Take 1 tablet (50 mg total) by mouth every 6 (six) hours as needed. 15 tablet 0   No current facility-administered medications for this visit.   Family History  Problem Relation Age of Onset  . Stroke Mother   . Diabetes Father   . Diabetes Mother   . Diabetes Maternal Grandmother   . Diabetes Maternal Grandfather   . Hypertension Mother   . Hypertension Father   . Cancer Paternal Grandfather     Prostate  . Aneurysm Mother    History   Social History  . Marital Status: Legally Separated    Spouse Name: N/A  . Number of Children: N/A  . Years of Education: 13   Occupational History  .       Social History Main Topics  . Smoking status: Current Some Day Smoker -- 0.30 packs/day for 1 years    Types: Cigarettes    Last Attempt to Quit: 07/15/2010  . Smokeless tobacco: Not on file  . Alcohol Use: 0.0 oz/week    0 Standard drinks or equivalent per week     Comment: once every 2 weeks  . Drug Use: No  . Sexual Activity: Not on file   Other Topics Concern  . Not on file   Social History Narrative   Review of Systems: Review of Systems  Constitutional: Positive for weight loss.       Occasionally feeling warm  Eyes: Positive for blurred vision (chronic).  Respiratory: Negative for cough, shortness of breath and wheezing.   Cardiovascular: Negative for chest pain, orthopnea and leg swelling.  Gastrointestinal: Positive for nausea and vomiting (once yesterday). Negative for diarrhea, constipation and blood in stool.  Genitourinary: Negative for dysuria (resolved ), urgency, frequency and hematuria.    Musculoskeletal: Negative for myalgias.  Neurological: Positive for dizziness (lightheadednees with standing ), sensory change (chronic peripheral neuropathy) and headaches.  Endo/Heme/Allergies: Negative for polydipsia.    Objective:  Physical Exam: Filed Vitals:   06/09/15 1053  BP: 161/80  Pulse: 88  Temp: 98.2 F (36.8 C)  TempSrc: Oral  Weight: 209 lb 12.8 oz (95.165 kg)  SpO2: 98%    Physical Exam  Constitutional: She is oriented to person, place, and time. She appears well-developed and well-nourished. No distress.  HENT:  Head: Normocephalic and atraumatic.  Right Ear: External ear normal.  Left Ear: External ear normal.  Nose: Nose normal.  Mouth/Throat: Oropharynx is clear and moist. No oropharyngeal exudate.  Eyes: Conjunctivae and EOM are normal. Pupils are equal, round, and reactive to light. Right eye exhibits no discharge. Left eye exhibits no discharge. No scleral icterus.  Neck: Normal range of motion. Neck supple.  Cardiovascular: Normal rate, regular rhythm and normal heart sounds.   No murmur heard. Pulmonary/Chest: Breath sounds normal. No respiratory distress. She has no wheezes. She has no rales.  Abdominal: Soft. Bowel sounds are normal. She exhibits no distension. There is no tenderness. There is no rebound and no guarding.  Musculoskeletal: Normal range of motion. She exhibits no edema or tenderness.  Neurological: She is alert and oriented to person, place, and time.  Skin: Skin is warm and dry. No rash noted. She is not diaphoretic. No erythema. No pallor.  Psychiatric: She has a normal mood and affect. Her behavior is normal. Judgment and thought content normal.    Assessment & Plan:   Please see problem list for problem-based assessment and plan

## 2015-06-09 NOTE — Progress Notes (Signed)
Internal Medicine Clinic Attending  Case discussed with Dr. Rabbani at the time of the visit.  We reviewed the resident's history and exam and pertinent patient test results.  I agree with the assessment, diagnosis, and plan of care documented in the resident's note.  

## 2015-06-09 NOTE — Patient Instructions (Addendum)
-  Start taking Novolog (70/30) 22 U twice a day for your diabetes -Start taking lisinopril 10 mg daily for high blood pressure -Start taking iron pill (ferrous sulfate 325 mg) once daily for anemia  -Start taking lipitor 40 mg daily for high cholesterol -Start taking gabapentin 300 mg three times daily (start one pill first day and two pills the second day then 3 pills daily after that) -Will check your bloodwork today and call you with the results  -Will check your eyes and feet today -Please come back in 2 weeks     General Instructions:   Please bring your medicines with you each time you come to clinic.  Medicines may include prescription medications, over-the-counter medications, herbal remedies, eye drops, vitamins, or other pills.   Progress Toward Treatment Goals:  Treatment Goal 10/16/2012  Hemoglobin A1C unchanged    Self Care Goals & Plans:  Self Care Goal 06/09/2015  Manage my medications -  Monitor my health check my feet daily  Eat healthy foods eat more vegetables; eat fruit for snacks and desserts; eat foods that are low in salt; eat baked foods instead of fried foods; eat smaller portions  Be physically active find an activity I enjoy    Home Blood Glucose Monitoring 10/16/2012  Check my blood sugar 2 times a day  When to check my blood sugar before breakfast; before dinner     Care Management & Community Referrals:  Referral 10/16/2012  Referrals made for care management support diabetes educator

## 2015-06-09 NOTE — Assessment & Plan Note (Addendum)
-  Obtain annual screening HIV Ab  -Inquire about pap smear testing at next visit

## 2015-06-09 NOTE — Assessment & Plan Note (Signed)
Assessment: Pt with last lipid panel on 10/22/13 with LDL 140 noncompliant with statin therapy with calculated 10-yr ASCVD risk >7.5% with recommendations to start moderate to high intensity statin therapy.    Plan: -Obtain annual lipid panel  -Start atorvastatin 40 mg daily -Obtain CMP  -Monitor for myalgias

## 2015-06-10 LAB — HIV ANTIBODY (ROUTINE TESTING W REFLEX): HIV 1&2 Ab, 4th Generation: NONREACTIVE

## 2015-06-22 ENCOUNTER — Encounter: Payer: Self-pay | Admitting: Dietician

## 2015-07-06 ENCOUNTER — Encounter: Payer: Self-pay | Admitting: Internal Medicine

## 2015-08-25 ENCOUNTER — Ambulatory Visit: Payer: Self-pay

## 2015-08-29 ENCOUNTER — Encounter (HOSPITAL_COMMUNITY): Payer: Self-pay | Admitting: Vascular Surgery

## 2015-08-29 ENCOUNTER — Emergency Department (HOSPITAL_COMMUNITY)
Admission: EM | Admit: 2015-08-29 | Discharge: 2015-08-30 | Disposition: A | Discharge status: Home / Self Care | Payer: Self-pay | Attending: Emergency Medicine | Admitting: Emergency Medicine

## 2015-08-29 DIAGNOSIS — Z79899 Other long term (current) drug therapy: Secondary | ICD-10-CM | POA: Insufficient documentation

## 2015-08-29 DIAGNOSIS — E119 Type 2 diabetes mellitus without complications: Secondary | ICD-10-CM | POA: Insufficient documentation

## 2015-08-29 DIAGNOSIS — Z9114 Patient's other noncompliance with medication regimen: Secondary | ICD-10-CM | POA: Insufficient documentation

## 2015-08-29 DIAGNOSIS — R609 Edema, unspecified: Secondary | ICD-10-CM

## 2015-08-29 DIAGNOSIS — Z87891 Personal history of nicotine dependence: Secondary | ICD-10-CM | POA: Insufficient documentation

## 2015-08-29 DIAGNOSIS — Z862 Personal history of diseases of the blood and blood-forming organs and certain disorders involving the immune mechanism: Secondary | ICD-10-CM | POA: Insufficient documentation

## 2015-08-29 DIAGNOSIS — Z8742 Personal history of other diseases of the female genital tract: Secondary | ICD-10-CM | POA: Insufficient documentation

## 2015-08-29 DIAGNOSIS — Z8669 Personal history of other diseases of the nervous system and sense organs: Secondary | ICD-10-CM | POA: Insufficient documentation

## 2015-08-29 DIAGNOSIS — J45909 Unspecified asthma, uncomplicated: Secondary | ICD-10-CM | POA: Insufficient documentation

## 2015-08-29 DIAGNOSIS — Z794 Long term (current) use of insulin: Secondary | ICD-10-CM | POA: Insufficient documentation

## 2015-08-29 DIAGNOSIS — R600 Localized edema: Secondary | ICD-10-CM | POA: Insufficient documentation

## 2015-08-29 LAB — URINALYSIS, ROUTINE W REFLEX MICROSCOPIC
Bilirubin Urine: NEGATIVE
Glucose, UA: >1000 mg/dL — AB
Ketones, ur: NEGATIVE mg/dL
Leukocytes, UA: NEGATIVE
Nitrite: NEGATIVE
Protein, ur: >300 mg/dL — AB
Specific Gravity, Urine: 1.028 (ref 1.005–1.030)
Urobilinogen, UA: 1 mg/dL (ref 0.0–1.0)
pH: 6.5 (ref 5.0–8.0)

## 2015-08-29 LAB — CBC WITH DIFFERENTIAL/PLATELET
BASOS ABS: 0 10*3/uL (ref 0.0–0.1)
Basophils Relative: 0 %
EOS ABS: 0.3 10*3/uL (ref 0.0–0.7)
Eosinophils Relative: 6 %
HEMATOCRIT: 28.8 % — AB (ref 36.0–46.0)
HEMOGLOBIN: 9.1 g/dL — AB (ref 12.0–15.0)
Lymphocytes Relative: 40 %
Lymphs Abs: 1.9 10*3/uL (ref 0.7–4.0)
MCH: 23.8 pg — ABNORMAL LOW (ref 26.0–34.0)
MCHC: 31.6 g/dL (ref 30.0–36.0)
MCV: 75.4 fL — ABNORMAL LOW (ref 78.0–100.0)
MONOS PCT: 9 %
Monocytes Absolute: 0.5 10*3/uL (ref 0.1–1.0)
NEUTROS ABS: 2.2 10*3/uL (ref 1.7–7.7)
NEUTROS PCT: 45 %
Platelets: 381 10*3/uL (ref 150–400)
RBC: 3.82 MIL/uL — ABNORMAL LOW (ref 3.87–5.11)
RDW: 14.2 % (ref 11.5–15.5)
WBC: 4.9 10*3/uL (ref 4.0–10.5)

## 2015-08-29 LAB — COMPREHENSIVE METABOLIC PANEL
ALBUMIN: 2.5 g/dL — AB (ref 3.5–5.0)
ALT: 17 U/L (ref 14–54)
ANION GAP: 6 (ref 5–15)
AST: 23 U/L (ref 15–41)
Alkaline Phosphatase: 105 U/L (ref 38–126)
BILIRUBIN TOTAL: 0.3 mg/dL (ref 0.3–1.2)
BUN: 11 mg/dL (ref 6–20)
CO2: 31 mmol/L (ref 22–32)
Calcium: 8.4 mg/dL — ABNORMAL LOW (ref 8.9–10.3)
Chloride: 98 mmol/L — ABNORMAL LOW (ref 101–111)
Creatinine, Ser: 1.1 mg/dL — ABNORMAL HIGH (ref 0.44–1.00)
GFR calc Af Amer: >60 mL/min (ref >60)
GFR calc non Af Amer: >60 mL/min (ref >60)
GLUCOSE: 269 mg/dL — AB (ref 65–99)
Potassium: 4 mmol/L (ref 3.5–5.1)
SODIUM: 135 mmol/L (ref 135–145)
Total Protein: 6.4 g/dL — ABNORMAL LOW (ref 6.5–8.1)

## 2015-08-29 LAB — URINE MICROSCOPIC-ADD ON

## 2015-08-29 LAB — D-DIMER, QUANTITATIVE: D-Dimer, Quant: 1.78 ug/mL-FEU — ABNORMAL HIGH (ref 0.00–0.48)

## 2015-08-29 LAB — BRAIN NATRIURETIC PEPTIDE: B Natriuretic Peptide: 54.8 pg/mL (ref 0.0–100.0)

## 2015-08-29 NOTE — ED Notes (Signed)
Pt reports to the ED for eval of bilateral lower leg swelling with the left greater than the right. She reports she had an injury to her left ankle previously. She denies any CP or SOB. Reports a HA since yesterday. She reports she took some Tylenol which helped her pain. Pt A&Ox4, resp e/u, and skin warm and dry.

## 2015-08-29 NOTE — ED Provider Notes (Signed)
CSN: [id number]     Arrival date & time 08/29/15  2122 History   First MD Initiated Contact with Patient 08/29/15 2155     Chief Complaint  Patient presents with  . Leg Swelling     (Consider location/radiation/quality/duration/timing/severity/associated sxs/prior Treatment) HPI Comments: Patient presents to the ER for evaluation of leg swelling. Patient reports that both her legs are swollen. She does have a history of having swelling of her legs, but symptoms have worsened recently. She reports that the left leg is more swollen than the right. She is experiencing some mild pain in the legs, but there is no redness, overlying skin changes. She has not injured herself. There is no chest pain, palpitations or shortness of breath.   Past Medical History  Diagnosis Date  . Asthma   . Hyperlipidemia   . Seasonal allergies   . Noncompliance with medications   . Peripheral neuropathy ([place])   . Diabetic retinopathy ([place])   . Iron deficiency anemia   . Diabetes mellitus     uncontrolled  . Menorrhagia    Past Surgical History  Procedure Laterality Date  . Tubal ligation    . Cesarean section     Family History  Problem Relation Age of Onset  . Stroke Mother   . Diabetes Father   . Diabetes Mother   . Diabetes Maternal Grandmother   . Diabetes Maternal Grandfather   . Hypertension Mother   . Hypertension Father   . Cancer Paternal Grandfather     Prostate  . Aneurysm Mother    Social History  Substance Use Topics  . Smoking status: Former Smoker -- 0.30 packs/day for 1 years    Types: Cigarettes    Quit date: 05/29/2015  . Smokeless tobacco: None  . Alcohol Use: 0.0 oz/week    0 Standard drinks or equivalent per week     Comment: once every 2 weeks   OB History    No data available     Review of Systems  Respiratory: Negative for shortness of breath.   Cardiovascular: Positive for leg swelling. Negative for chest pain.  Skin: Negative for rash.  All other systems  reviewed and are negative.     Allergies  Review of patient's allergies indicates no known allergies.  Home Medications   Prior to Admission medications   Medication Sig Start Date End Date Taking? Authorizing Provider  albuterol (PROVENTIL HFA;VENTOLIN HFA) 108 (90 BASE) MCG/ACT inhaler Inhale 2 puffs into the lungs every 6 (six) hours as needed for wheezing or shortness of breath. 08/05/14  Yes Blain Pais, MD  insulin aspart protamine- aspart (NOVOLOG MIX 70/30) (70-30) 100 UNIT/ML injection Inject 0.22 mLs (22 Units total) into the skin 2 (two) times daily with a meal. 06/09/15  Yes Marjan Rabbani, MD  furosemide (LASIX) 20 MG tablet Take 1 tablet (20 mg total) by mouth daily. 08/30/15   Orpah Greek, MD   BP 159/80 mmHg  Pulse 90  Temp(Src) 98.4 F (36.9 C) (Oral)  Resp 18  Ht 5\' 5"  (1.651 m)  Wt 210 lb (95.255 kg)  BMI 34.95 kg/m2  SpO2 99% Physical Exam  Constitutional: She is oriented to person, place, and time. She appears well-developed and well-nourished. No distress.  HENT:  Head: Normocephalic and atraumatic.  Right Ear: Hearing normal.  Left Ear: Hearing normal.  Nose: Nose normal.  Mouth/Throat: Oropharynx is clear and moist and mucous membranes are normal.  Eyes: Conjunctivae and EOM are normal. Pupils are  equal, round, and reactive to light.  Neck: Normal range of motion. Neck supple.  Cardiovascular: Regular rhythm, S1 normal and S2 normal.  Exam reveals no gallop and no friction rub.   No murmur heard. Pulmonary/Chest: Effort normal and breath sounds normal. No respiratory distress. She exhibits no tenderness.  Abdominal: Soft. Normal appearance and bowel sounds are normal. There is no hepatosplenomegaly. There is no tenderness. There is no rebound, no guarding, no tenderness at McBurney's point and negative Murphy's sign. No hernia.  Musculoskeletal: Normal range of motion. She exhibits edema (lower legs bilaterally, left > right).   Neurological: She is alert and oriented to person, place, and time. She has normal strength. No cranial nerve deficit or sensory deficit. Coordination normal. GCS eye subscore is 4. GCS verbal subscore is 5. GCS motor subscore is 6.  Skin: Skin is warm, dry and intact. No rash noted. No cyanosis.  Psychiatric: She has a normal mood and affect. Her speech is normal and behavior is normal. Thought content normal.  Nursing note and vitals reviewed.   ED Course  Procedures (including critical care time) Labs Review Labs Reviewed  CBC WITH DIFFERENTIAL/PLATELET - Abnormal; Notable for the following:    RBC 3.82 (*)    Hemoglobin 9.1 (*)    HCT 28.8 (*)    MCV 75.4 (*)    MCH 23.8 (*)    All other components within normal limits  COMPREHENSIVE METABOLIC PANEL - Abnormal; Notable for the following:    Chloride 98 (*)    Glucose, Bld 269 (*)    Creatinine, Ser 1.10 (*)    Calcium 8.4 (*)    Total Protein 6.4 (*)    Albumin 2.5 (*)    All other components within normal limits  D-DIMER, QUANTITATIVE (NOT AT [facility]) - Abnormal; Notable for the following:    D-Dimer, Quant 1.78 (*)    All other components within normal limits  URINALYSIS, ROUTINE W REFLEX MICROSCOPIC (NOT AT [facility]) - Abnormal; Notable for the following:    Glucose, UA >1000 (*)    Hgb urine dipstick MODERATE (*)    Protein, ur >300 (*)    All other components within normal limits  BRAIN NATRIURETIC PEPTIDE  URINE MICROSCOPIC-ADD ON    Imaging Review No results found. I have personally reviewed and evaluated these images and lab results as part of my medical decision-making.   EKG Interpretation None      MDM   Final diagnoses:  Peripheral edema    Patient presents to the ER for evaluation of lower extremity edema. Patient reports a history of edema of her legs in the past, but symptoms have significantly worsened. Patient does have asymmetric swelling, with significant left lower extremity swelling and moderate  right lower extremity swelling. Swelling is lower leg only and pedal edema. This is most likely peripheral edema. She has palpable pulses, no concern for peripheral arterial disease. There is no overlying redness, warmth or signs of cellulitis. Workup does not show any sign of congestive heart failure, nor does her examination. Patient will, however, require venous ultrasound to rule out DVT. This will be scheduled for the morning. Single dose of Lovenox for tonight. She was prescribed Lasix to be used if study is negative, and is to follow-up with her doctor in the internal medicine outpatient clinic here at [facility] this week.    Orpah Greek, MD 08/30/15 [phone]

## 2015-08-30 ENCOUNTER — Ambulatory Visit (HOSPITAL_COMMUNITY)
Admission: RE | Admit: 2015-08-30 | Discharge: 2015-08-30 | Disposition: A | Discharge status: Home / Self Care | Payer: Self-pay | Source: Ambulatory Visit | Attending: Emergency Medicine | Admitting: Emergency Medicine

## 2015-08-30 DIAGNOSIS — R590 Localized enlarged lymph nodes: Secondary | ICD-10-CM | POA: Insufficient documentation

## 2015-08-30 DIAGNOSIS — M79609 Pain in unspecified limb: Secondary | ICD-10-CM | POA: Insufficient documentation

## 2015-08-30 DIAGNOSIS — M7989 Other specified soft tissue disorders: Secondary | ICD-10-CM | POA: Insufficient documentation

## 2015-08-30 MED ORDER — ENOXAPARIN SODIUM 100 MG/ML ~~LOC~~ SOLN
1.0000 mg/kg | Freq: Once | SUBCUTANEOUS | Status: AC
  Administered 2015-08-30: 95 mg via SUBCUTANEOUS
  Filled 2015-08-30: qty 1

## 2015-08-30 MED ORDER — FUROSEMIDE 20 MG PO TABS: 20.0000 mg | ORAL_TABLET | Freq: Every day | ORAL | Status: DC

## 2015-08-30 NOTE — Progress Notes (Signed)
VASCULAR LAB PRELIMINARY  PRELIMINARY  PRELIMINARY  PRELIMINARY  Bilateral lower extremity venous duplex completed.    Preliminary report:  There is no DVT or SVT noted in the bilateral lower extremities.  There is an enlarged lymph node noted in the left groin.  Randolf Sansoucie, RVT 08/30/2015, 9:21 AM

## 2015-08-30 NOTE — ED Notes (Signed)
Pt stable, ambulatory, states understanding of discharge instructions, made aware of vascular study scheduling in the morning

## 2015-08-30 NOTE — Discharge Instructions (Signed)
Return tomorrow for scheduled ultrasound of your leg to rule out blood clot. If study is negative, you will take the fluid pill as prescribed and follow-up with your primary doctor in the office this week for a recheck.  Peripheral Edema You have swelling in your legs (peripheral edema). This swelling is due to excess accumulation of salt and water in your body. Edema may be a sign of heart, kidney or liver disease, or a side effect of a medication. It may also be due to problems in the leg veins. Elevating your legs and using special support stockings may be very helpful, if the cause of the swelling is due to poor venous circulation. Avoid long periods of standing, whatever the cause. Treatment of edema depends on identifying the cause. Chips, pretzels, pickles and other salty foods should be avoided. Restricting salt in your diet is almost always needed. Water pills (diuretics) are often used to remove the excess salt and water from your body via urine. These medicines prevent the kidney from reabsorbing sodium. This increases urine flow. Diuretic treatment may also result in lowering of potassium levels in your body. Potassium supplements may be needed if you have to use diuretics daily. Daily weights can help you keep track of your progress in clearing your edema. You should call your caregiver for follow up care as recommended. SEEK IMMEDIATE MEDICAL CARE IF:   You have increased swelling, pain, redness, or heat in your legs.  You develop shortness of breath, especially when lying down.  You develop chest or abdominal pain, weakness, or fainting.  You have a fever.   This information is not intended to replace advice given to you by your health care provider. Make sure you discuss any questions you have with your health care provider.   Document Released: 12/08/2004 Document Revised: 01/23/2012 Document Reviewed: 05/13/2015 Elsevier Interactive Patient Education [employer].

## 2015-08-31 ENCOUNTER — Encounter (HOSPITAL_COMMUNITY): Payer: Self-pay | Admitting: *Deleted

## 2015-08-31 ENCOUNTER — Inpatient Hospital Stay (HOSPITAL_COMMUNITY)
Admission: AD | Admit: 2015-08-31 | Discharge: 2015-08-31 | Disposition: A | Discharge status: Home / Self Care | Payer: Self-pay | Source: Ambulatory Visit | Attending: Family Medicine | Admitting: Family Medicine

## 2015-08-31 DIAGNOSIS — B9689 Other specified bacterial agents as the cause of diseases classified elsewhere: Secondary | ICD-10-CM | POA: Insufficient documentation

## 2015-08-31 DIAGNOSIS — A499 Bacterial infection, unspecified: Secondary | ICD-10-CM

## 2015-08-31 DIAGNOSIS — N760 Acute vaginitis: Secondary | ICD-10-CM | POA: Insufficient documentation

## 2015-08-31 DIAGNOSIS — Z113 Encounter for screening for infections with a predominantly sexual mode of transmission: Secondary | ICD-10-CM | POA: Insufficient documentation

## 2015-08-31 DIAGNOSIS — Z87891 Personal history of nicotine dependence: Secondary | ICD-10-CM | POA: Insufficient documentation

## 2015-08-31 HISTORY — DX: Essential (primary) hypertension: I10

## 2015-08-31 LAB — URINE MICROSCOPIC-ADD ON

## 2015-08-31 LAB — URINALYSIS, ROUTINE W REFLEX MICROSCOPIC
Bilirubin Urine: NEGATIVE
GLUCOSE, UA: 250 mg/dL — AB
Ketones, ur: NEGATIVE mg/dL
Leukocytes, UA: NEGATIVE
Nitrite: NEGATIVE
PH: 7 (ref 5.0–8.0)
PROTEIN: 100 mg/dL — AB
Specific Gravity, Urine: 1.02 (ref 1.005–1.030)
Urobilinogen, UA: 0.2 mg/dL (ref 0.0–1.0)

## 2015-08-31 LAB — WET PREP, GENITAL
TRICH WET PREP: NONE SEEN
YEAST WET PREP: NONE SEEN

## 2015-08-31 LAB — CBC
HCT: 29.7 % — ABNORMAL LOW (ref 36.0–46.0)
Hemoglobin: 9.2 g/dL — ABNORMAL LOW (ref 12.0–15.0)
MCH: 23.2 pg — ABNORMAL LOW (ref 26.0–34.0)
MCHC: 31 g/dL (ref 30.0–36.0)
MCV: 74.8 fL — AB (ref 78.0–100.0)
PLATELETS: 393 10*3/uL (ref 150–400)
RBC: 3.97 MIL/uL (ref 3.87–5.11)
RDW: 14.2 % (ref 11.5–15.5)
WBC: 3.8 10*3/uL — AB (ref 4.0–10.5)

## 2015-08-31 LAB — POCT PREGNANCY, URINE: Preg Test, Ur: NEGATIVE

## 2015-08-31 MED ORDER — METRONIDAZOLE 500 MG PO TABS: 500.0000 mg | ORAL_TABLET | Freq: Two times a day (BID) | ORAL | Status: DC

## 2015-08-31 MED ORDER — ACETAMINOPHEN 325 MG PO TABS
650.0000 mg | ORAL_TABLET | Freq: Once | ORAL | Status: AC
  Administered 2015-08-31: 650 mg via ORAL
  Filled 2015-08-31: qty 2

## 2015-08-31 MED ORDER — LISINOPRIL 2.5 MG PO TABS
2.5000 mg | ORAL_TABLET | Freq: Once | ORAL | Status: AC
  Administered 2015-08-31: 2.5 mg via ORAL
  Filled 2015-08-31: qty 1

## 2015-08-31 NOTE — MAU Note (Signed)
Pt has new sexual partner, had intercourse & condom broke, now has vaginal odor x 3 days.  Wants to be checked for STD's.  Has some itching & irritation, hasn't noticed discharge.  Denies abd pain.

## 2015-08-31 NOTE — MAU Note (Signed)
Elevated BP's at discharge reported to Jorje Guild, NP.  BP taken in both arms with large cuff.  Pt states she hasn't taken her HTN meds today yet.  Pt placed back on meds about 3 days ago per pt.  Provider at bedside.

## 2015-08-31 NOTE — MAU Provider Note (Signed)
History     CSN: [id number]  Arrival date and time: 08/31/15 [id number]   First Provider Initiated Contact with Patient 08/31/15 1144         Chief Complaint  Patient presents with  . Vaginal Discharge   HPI  Jane [name] is a 39 y.o. female who presents requesting STD testing.  Has new partner; normally uses condoms but condom broke with last intercourse.  Denies fever, abdominal pain, vaginal bleeding, or vaginal discharge.  Denies dyspareunia or post coital bleeding.  Reports vaginal odor x 3 days.    Past Medical History  Diagnosis Date  . Asthma   . Hyperlipidemia   . Seasonal allergies   . Noncompliance with medications   . Peripheral neuropathy ([place])   . Diabetic retinopathy ([place])   . Iron deficiency anemia   . Diabetes mellitus     uncontrolled  . Menorrhagia   . Hypertension     Past Surgical History  Procedure Laterality Date  . Tubal ligation    . Cesarean section      Family History  Problem Relation Age of Onset  . Stroke Mother   . Diabetes Father   . Diabetes Mother   . Diabetes Maternal Grandmother   . Diabetes Maternal Grandfather   . Hypertension Mother   . Hypertension Father   . Cancer Paternal Grandfather     Prostate  . Aneurysm Mother     Social History  Substance Use Topics  . Smoking status: Former Smoker -- 0.30 packs/day for 1 years    Types: Cigarettes    Quit date: 05/29/2015  . Smokeless tobacco: None  . Alcohol Use: 0.0 oz/week    0 Standard drinks or equivalent per week     Comment: once every 2 weeks    Allergies: No Known Allergies  Prescriptions prior to admission  Medication Sig Dispense Refill Last Dose  . albuterol (PROVENTIL HFA;VENTOLIN HFA) 108 (90 BASE) MCG/ACT inhaler Inhale 2 puffs into the lungs every 6 (six) hours as needed for wheezing or shortness of breath. 1 Inhaler 2 Past Week at Unknown time  . furosemide (LASIX) 20 MG tablet Take 1 tablet (20 mg total) by mouth daily. 5 tablet 0   .  insulin aspart protamine- aspart (NOVOLOG MIX 70/30) (70-30) 100 UNIT/ML injection Inject 0.22 mLs (22 Units total) into the skin 2 (two) times daily with a meal. 10 mL 11 Past Month at Unknown time    Review of Systems  Constitutional: Negative.   Respiratory: Negative for shortness of breath.   Cardiovascular: Negative for chest pain.  Gastrointestinal: Negative.   Genitourinary: Negative.    Physical Exam   Blood pressure 148/91, pulse 88, temperature 98.4 F (36.9 C), temperature source Oral, resp. rate 16, last menstrual period 08/18/2015.   Patient Vitals for the past 24 hrs:  BP Temp Temp src Pulse Resp  08/31/15 1413 175/91 mmHg - - 93 -  08/31/15 1338 189/100 mmHg - - 96 18  08/31/15 1239 (!) 186/103 mmHg 98 F (36.7 C) Oral 97 18  08/31/15 1238 (!) 191/111 mmHg - - 97 18  08/31/15 0952 148/91 mmHg 98.4 F (36.9 C) Oral 88 16     Physical Exam  Nursing note and vitals reviewed. Constitutional: She is oriented to person, place, and time. She appears well-developed and well-nourished. No distress.  HENT:  Head: Normocephalic and atraumatic.  Eyes: Conjunctivae are normal. Right eye exhibits no discharge. Left eye exhibits no discharge. No scleral icterus.  Neck: Normal range of motion.  Cardiovascular: Normal rate, regular rhythm and normal heart sounds.   No murmur heard. Respiratory: Effort normal and breath sounds normal. No respiratory distress. She has no wheezes.  GI: Soft. She exhibits no distension. There is no tenderness.  Genitourinary: Vagina normal and uterus normal. Cervix exhibits discharge (small amount of mucoid discharge at os). Cervix exhibits no motion tenderness and no friability.  Neurological: She is alert and oriented to person, place, and time.  Skin: Skin is warm and dry. She is not diaphoretic.  Psychiatric: She has a normal mood and affect. Her behavior is normal. Judgment and thought content normal.    MAU Course  Procedures Results for  orders placed or performed during the hospital encounter of 08/31/15 (from the past 24 hour(s))  Urinalysis, Routine w reflex microscopic (not at [facility])     Status: Abnormal   Collection Time: 08/31/15 10:01 AM  Result Value Ref Range   Color, Urine YELLOW YELLOW   APPearance CLEAR CLEAR   Specific Gravity, Urine 1.020 1.005 - 1.030   pH 7.0 5.0 - 8.0   Glucose, UA 250 (A) NEGATIVE mg/dL   Hgb urine dipstick MODERATE (A) NEGATIVE   Bilirubin Urine NEGATIVE NEGATIVE   Ketones, ur NEGATIVE NEGATIVE mg/dL   Protein, ur 100 (A) NEGATIVE mg/dL   Urobilinogen, UA 0.2 0.0 - 1.0 mg/dL   Nitrite NEGATIVE NEGATIVE   Leukocytes, UA NEGATIVE NEGATIVE  Urine microscopic-add on     Status: Abnormal   Collection Time: 08/31/15 10:01 AM  Result Value Ref Range   Squamous Epithelial / LPF FEW (A) RARE   WBC, UA 0-2 <3 WBC/hpf   RBC / HPF 0-2 <3 RBC/hpf   Bacteria, UA RARE RARE   Casts HYALINE CASTS (A) NEGATIVE  Pregnancy, urine POC     Status: None   Collection Time: 08/31/15 10:02 AM  Result Value Ref Range   Preg Test, Ur NEGATIVE NEGATIVE  Wet prep, genital     Status: Abnormal   Collection Time: 08/31/15 11:51 AM  Result Value Ref Range   Yeast Wet Prep HPF POC NONE SEEN NONE SEEN   Trich, Wet Prep NONE SEEN NONE SEEN   Clue Cells Wet Prep HPF POC MODERATE (A) NONE SEEN   WBC, Wet Prep HPF POC FEW (A) NONE SEEN  CBC     Status: Abnormal   Collection Time: 08/31/15 11:58 AM  Result Value Ref Range   WBC 3.8 (L) 4.0 - 10.5 K/uL   RBC 3.97 3.87 - 5.11 MIL/uL   Hemoglobin 9.2 (L) 12.0 - 15.0 g/dL   HCT 29.7 (L) 36.0 - 46.0 %   MCV 74.8 (L) 78.0 - 100.0 fL   MCH 23.2 (L) 26.0 - 34.0 pg   MCHC 31.0 30.0 - 36.0 g/dL   RDW 14.2 11.5 - 15.5 %   Platelets 393 150 - 400 K/uL    MDM STD testing per patient request Elevated BPs; pt has chronic HTN, taking lisinopril, has not taking dose today.  Spoke with Dr. Nehemiah Settle regarding pt's severe range BP. Give dose of lisinopril & reevaluate.  BP  mildly decreased after lisinopril. Patient asymptomatic & has appt with PCP on Wednesday. Ok to discharge home per Dr. Nehemiah Settle.  Assessment and Plan  A: 1. BV (bacterial vaginosis)   2. Screen for STD (sexually transmitted disease)    P; Discharge home GC/CT, HIV, RPR pending Discussed reasons to go to ED or call 911 - chest pain, SOB,  s/s stroke Keep f/u appt with PCP  Jorje Guild, NP  08/31/2015, 11:44 AM

## 2015-08-31 NOTE — Discharge Instructions (Signed)
Bacterial Vaginosis °Bacterial vaginosis is an infection of the vagina. It happens when too many germs (bacteria) grow in the vagina. Having this infection puts you at risk for getting other infections from sex. Treating this infection can help lower your risk for other infections, such as:  °· Chlamydia. °· Gonorrhea. °· HIV. °· Herpes. °HOME CARE °· Take your medicine as told by your doctor. °· Finish your medicine even if you start to feel better. °· Tell your sex partner that you have an infection. They should see their doctor for treatment. °· During treatment: °¨ Avoid sex or use condoms correctly. °¨ Do not douche. °¨ Do not drink alcohol unless your doctor tells you it is ok. °¨ Do not breastfeed unless your doctor tells you it is ok. °GET HELP IF: °· You are not getting better after 3 days of treatment. °· You have more grey fluid (discharge) coming from your vagina than before. °· You have more pain than before. °· You have a fever. °MAKE SURE YOU:  °· Understand these instructions. °· Will watch your condition. °· Will get help right away if you are not doing well or get worse. °  °This information is not intended to replace advice given to you by your health care provider. Make sure you discuss any questions you have with your health care provider. °  °Document Released: 08/09/2008 Document Revised: 11/21/2014 Document Reviewed: 06/12/2013 °Elsevier Interactive Patient Education ©2016 Elsevier Inc. ° °

## 2015-09-01 LAB — RPR: RPR: NONREACTIVE

## 2015-09-01 LAB — GC/CHLAMYDIA PROBE AMP (~~LOC~~) NOT AT ARMC
CHLAMYDIA, DNA PROBE: NEGATIVE
NEISSERIA GONORRHEA: NEGATIVE

## 2015-09-01 LAB — HIV ANTIBODY (ROUTINE TESTING W REFLEX): HIV SCREEN 4TH GENERATION: NONREACTIVE

## 2015-09-02 ENCOUNTER — Ambulatory Visit: Payer: Self-pay | Admitting: Internal Medicine

## 2015-09-07 ENCOUNTER — Ambulatory Visit (INDEPENDENT_AMBULATORY_CARE_PROVIDER_SITE_OTHER): Payer: Self-pay | Admitting: Internal Medicine

## 2015-09-07 ENCOUNTER — Encounter: Payer: Self-pay | Admitting: Internal Medicine

## 2015-09-07 VITALS — BP 173/94 | HR 92 | Temp 98.5°F | Ht 65.0 in | Wt 205.1 lb

## 2015-09-07 DIAGNOSIS — E1165 Type 2 diabetes mellitus with hyperglycemia: Secondary | ICD-10-CM

## 2015-09-07 DIAGNOSIS — R600 Localized edema: Secondary | ICD-10-CM

## 2015-09-07 DIAGNOSIS — Z794 Long term (current) use of insulin: Secondary | ICD-10-CM

## 2015-09-07 DIAGNOSIS — Z79899 Other long term (current) drug therapy: Secondary | ICD-10-CM

## 2015-09-07 DIAGNOSIS — E11610 Type 2 diabetes mellitus with diabetic neuropathic arthropathy: Secondary | ICD-10-CM | POA: Insufficient documentation

## 2015-09-07 DIAGNOSIS — I10 Essential (primary) hypertension: Secondary | ICD-10-CM

## 2015-09-07 DIAGNOSIS — E113399 Type 2 diabetes mellitus with moderate nonproliferative diabetic retinopathy without macular edema, unspecified eye: Secondary | ICD-10-CM

## 2015-09-07 MED ORDER — INSULIN ASPART PROT & ASPART (70-30 MIX) 100 UNIT/ML ~~LOC~~ SUSP: 20.0000 [IU] | Freq: Two times a day (BID) | SUBCUTANEOUS | Status: DC

## 2015-09-07 MED ORDER — LISINOPRIL 10 MG PO TABS: 10.0000 mg | ORAL_TABLET | Freq: Every day | ORAL | Status: DC

## 2015-09-07 MED ORDER — LISINOPRIL 5 MG PO TABS: 5.0000 mg | ORAL_TABLET | Freq: Every day | ORAL | Status: DC
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
## 2015-09-07 NOTE — Assessment & Plan Note (Signed)
BP Readings from Last 3 Encounters:  09/07/15 173/94  08/31/15 175/91  08/29/15 144/77    Lab Results  Component Value Date   NA 135 08/29/2015   K 4.0 08/29/2015   CREATININE 1.10* 08/29/2015    Assessment: Blood pressure control: moderately elevated Progress toward BP goal:  unchanged Comments: BP remains elevated. Patient did not take medication today, but reports she has been taking it every day. She is only taking Lisinopril 2.5 mg daily, but per last clinic note should be on 10 mg daily. She was not aware of medication increase.   Plan: Medications:  Increase Lisinopril to 5 mg daily.  Educational resources provided: brochure, handout, video Other plans:  - BP recheck in 2 weeks - Stressed importance of taking BP medication every day

## 2015-09-07 NOTE — Assessment & Plan Note (Signed)
Lab Results  Component Value Date   HGBA1C >14.0 06/09/2015   HGBA1C 13.9* 08/15/2014   HGBA1C >14.0 08/05/2014     Assessment: Diabetes control: poor control (HgbA1C >9%) Progress toward A1C goal:  unable to assess Comments: Unable to evaluate how blood sugars are doing because she did not bring her glucometer today. Per her report, it sounds like her blood sugars have improved since cutting out regular soda. Her work schedule (3rd shift) makes controlling her diabetes difficult. Additionally, she admitted to not always taking her insulin due to fear of hypoglycemia.   Plan: Medications:  Decrease Novolog 70/30 to 20 units BID. Emphasized to her to take this medication at her meal times (2 AM and 1-2 PM) even if her blood sugar is in 70-120 range. If her blood sugar is on lower side, I suggested for her to eat an extra 15 g of carbohydrate to prevent hypoglycemia. I educated her on how 70/30 works and that she needs the long acting component of the insulin to help stabilize blood sugars which is why she needs to take the insulin at mealtimes. Home glucose monitoring: Frequency: 2 times a day Timing: before breakfast, before dinner Instruction/counseling given: reminded to bring blood glucose meter & log to each visit, reminded to bring medications to each visit and discussed diet Educational resources provided: brochure, handout Other plans:  - Hopefully by decreasing the dose a bit the patient will feel more comfortable and actually take her insulin BID, and therefore we can get a better idea of how her blood sugars are doing on that regimen - f/u in 2 weeks to reassess blood sugars and insulin if necessary

## 2015-09-07 NOTE — Assessment & Plan Note (Signed)
The etiology for her LLE edema is unclear. Her symptoms do seem most consistent with a DVT (unilateral swelling, pain in calf) and she did have an elevated d-dimer in the ED, LE doppler was negative for DVT. The [place] did show an enlarged left groin lymph node. It's possible that this lymph node could be causing lymphatic obstruction thereby resulting in her unilateral LE edema. I do not think a repeat LE [place] would be helpful. Will obtain a CT Abd/Pelvis to further evaluate. I discussed this with the patient as she is currently uninsured but she agrees to proceed with the scan. - f/u CT Abd/Pelvis

## 2015-09-07 NOTE — Patient Instructions (Signed)
-   Increase Lisinopril to 5 mg daily - Decrease Novolog 70/30 to 20 units twice daily. Please take at 2 AM and 1-2 PM (when you eat).  - Check blood sugars 3 times daily if possible - Will schedule CT Abd/Pelvis to evaluate for lymphatic obstruction.  - Follow up in 2 weeks to recheck blood pressure and diabetes  General Instructions:   Please bring your medicines with you each time you come to clinic.  Medicines may include prescription medications, over-the-counter medications, herbal remedies, eye drops, vitamins, or other pills.   Progress Toward Treatment Goals:  Treatment Goal 10/16/2012  Hemoglobin A1C unchanged    Self Care Goals & Plans:  Self Care Goal 09/07/2015  Manage my medications take my medicines as prescribed; bring my medications to every visit; refill my medications on time; follow the sick day instructions if I am sick  Monitor my health keep track of my blood glucose; check my feet daily  Eat healthy foods eat more vegetables; eat fruit for snacks and desserts; eat baked foods instead of fried foods; eat foods that are low in salt; eat smaller portions  Be physically active find an activity I enjoy    Home Blood Glucose Monitoring 10/16/2012  Check my blood sugar 2 times a day  When to check my blood sugar before breakfast; before dinner     Care Management & Community Referrals:  Referral 10/16/2012  Referrals made for care management support diabetes educator

## 2015-09-08 NOTE — Progress Notes (Signed)
Case discussed with Dr. Arcelia Jew at time of visit.  We reviewed the resident's history and exam and pertinent patient test results.  I agree with the assessment, diagnosis, and plan of care documented in the resident's note.

## 2015-09-09 ENCOUNTER — Telehealth: Payer: Self-pay | Admitting: Internal Medicine

## 2015-09-09 NOTE — Telephone Encounter (Signed)
PEGGY FROM SCHEDULING [phone] CALLED AND NEEDS ORDER FOR THIS PATIENT FOR PELVIC CT WITH AND WITHOUT CONTRAST SCHEDULED FOR TOMORROW. SHE WOULD LIKE A CALL BACK.

## 2015-09-09 NOTE — Telephone Encounter (Signed)
Faxed signed orders - Dr Arcelia Jew checked and order for procedure is signed. Unable to give reason for this signed order.

## 2015-09-10 ENCOUNTER — Other Ambulatory Visit: Payer: Self-pay | Admitting: Internal Medicine

## 2015-09-10 ENCOUNTER — Ambulatory Visit (HOSPITAL_COMMUNITY)
Admission: RE | Admit: 2015-09-10 | Discharge: 2015-09-10 | Disposition: A | Discharge status: Home / Self Care | Payer: Self-pay | Source: Ambulatory Visit | Attending: Internal Medicine | Admitting: Internal Medicine

## 2015-09-10 ENCOUNTER — Encounter (HOSPITAL_COMMUNITY): Payer: Self-pay

## 2015-09-10 DIAGNOSIS — R600 Localized edema: Secondary | ICD-10-CM | POA: Insufficient documentation

## 2015-09-10 MED ORDER — IOHEXOL 300 MG/ML  SOLN
100.0000 mL | Freq: Once | INTRAMUSCULAR | Status: AC | PRN
  Administered 2015-09-10: 100 mL via INTRAVENOUS

## 2015-09-11 ENCOUNTER — Ambulatory Visit: Payer: Self-pay

## 2015-09-23 ENCOUNTER — Telehealth: Payer: Self-pay | Admitting: *Deleted

## 2015-09-23 ENCOUNTER — Ambulatory Visit: Payer: Self-pay

## 2015-09-23 NOTE — Telephone Encounter (Signed)
Pt was here to sign up for the affordable care insurance - wanted to check on CT results recently done. Dr Eppie Gibson talked with pt. Hilda Blades Zyan Coby RN 09/23/15 2:50PM

## 2015-10-16 ENCOUNTER — Emergency Department (HOSPITAL_COMMUNITY)
Admission: EM | Admit: 2015-10-16 | Discharge: 2015-10-16 | Disposition: A | Discharge status: Home / Self Care | Payer: Self-pay | Attending: Emergency Medicine | Admitting: Emergency Medicine

## 2015-10-16 ENCOUNTER — Encounter (HOSPITAL_COMMUNITY): Payer: Self-pay | Admitting: Emergency Medicine

## 2015-10-16 DIAGNOSIS — I10 Essential (primary) hypertension: Secondary | ICD-10-CM | POA: Insufficient documentation

## 2015-10-16 DIAGNOSIS — E11319 Type 2 diabetes mellitus with unspecified diabetic retinopathy without macular edema: Secondary | ICD-10-CM | POA: Insufficient documentation

## 2015-10-16 DIAGNOSIS — Z79899 Other long term (current) drug therapy: Secondary | ICD-10-CM | POA: Insufficient documentation

## 2015-10-16 DIAGNOSIS — J45909 Unspecified asthma, uncomplicated: Secondary | ICD-10-CM | POA: Insufficient documentation

## 2015-10-16 DIAGNOSIS — R600 Localized edema: Secondary | ICD-10-CM | POA: Insufficient documentation

## 2015-10-16 DIAGNOSIS — M79672 Pain in left foot: Secondary | ICD-10-CM

## 2015-10-16 DIAGNOSIS — Z794 Long term (current) use of insulin: Secondary | ICD-10-CM | POA: Insufficient documentation

## 2015-10-16 DIAGNOSIS — Z862 Personal history of diseases of the blood and blood-forming organs and certain disorders involving the immune mechanism: Secondary | ICD-10-CM | POA: Insufficient documentation

## 2015-10-16 DIAGNOSIS — Z9119 Patient's noncompliance with other medical treatment and regimen: Secondary | ICD-10-CM | POA: Insufficient documentation

## 2015-10-16 DIAGNOSIS — Z87891 Personal history of nicotine dependence: Secondary | ICD-10-CM | POA: Insufficient documentation

## 2015-10-16 DIAGNOSIS — Z8742 Personal history of other diseases of the female genital tract: Secondary | ICD-10-CM | POA: Insufficient documentation

## 2015-10-16 DIAGNOSIS — G8929 Other chronic pain: Secondary | ICD-10-CM | POA: Insufficient documentation

## 2015-10-16 DIAGNOSIS — M25572 Pain in left ankle and joints of left foot: Secondary | ICD-10-CM | POA: Insufficient documentation

## 2015-10-16 DIAGNOSIS — Z792 Long term (current) use of antibiotics: Secondary | ICD-10-CM | POA: Insufficient documentation

## 2015-10-16 MED ORDER — GABAPENTIN 100 MG PO CAPS: 100.0000 mg | ORAL_CAPSULE | Freq: Three times a day (TID) | ORAL | Status: DC | PRN

## 2015-10-16 NOTE — ED Notes (Signed)
Pt st's she has had pain in left ankle for a while.  St's both legs and ankles swell but left is worse than right.  Pt denies any injury to ankle

## 2015-10-16 NOTE — Discharge Instructions (Signed)
Read the information below.  Use the prescribed medication as directed.  Please discuss all new medications with your pharmacist.  You may return to the Emergency Department at any time for worsening condition or any new symptoms that concern you.    If you develop uncontrolled pain, weakness or numbness of the extremity, severe discoloration of the skin, or you are unable to walk, return to the ER for a recheck.     Chronic Pain Chronic pain can be defined as pain that is off and on and lasts for 3-6 months or longer. Many things cause chronic pain, which can make it difficult to make a diagnosis. There are many treatment options available for chronic pain. However, finding a treatment that works well for you may require trying various approaches until the right one is found. Many people benefit from a combination of two or more types of treatment to control their pain. SYMPTOMS  Chronic pain can occur anywhere in the body and can range from mild to very severe. Some types of chronic pain include:  Headache.  Low back pain.  Cancer pain.  Arthritis pain.  Neurogenic pain. This is pain resulting from damage to nerves. People with chronic pain may also have other symptoms such as:  Depression.  Anger.  Insomnia.  Anxiety. DIAGNOSIS  Your health care provider will help diagnose your condition over time. In many cases, the initial focus will be on excluding possible conditions that could be causing the pain. Depending on your symptoms, your health care provider may order tests to diagnose your condition. Some of these tests may include:   Blood tests.   CT scan.   MRI.   X-rays.   Ultrasounds.   Nerve conduction studies.  You may need to see a specialist.  TREATMENT  Finding treatment that works well may take time. You may be referred to a pain specialist. He or she may prescribe medicine or therapies, such as:   Mindful meditation or yoga.  Shots (injections) of  numbing or pain-relieving medicines into the spine or area of pain.  Local electrical stimulation.  Acupuncture.   Massage therapy.   Aroma, color, light, or sound therapy.   Biofeedback.   Working with a physical therapist to keep from getting stiff.   Regular, gentle exercise.   Cognitive or behavioral therapy.   Group support.  Sometimes, surgery may be recommended.  HOME CARE INSTRUCTIONS   Take all medicines as directed by your health care provider.   Lessen stress in your life by relaxing and doing things such as listening to calming music.   Exercise or be active as directed by your health care provider.   Eat a healthy diet and include things such as vegetables, fruits, fish, and lean meats in your diet.   Keep all follow-up appointments with your health care provider.   Attend a support group with others suffering from chronic pain. SEEK MEDICAL CARE IF:   Your pain gets worse.   You develop a new pain that was not there before.   You cannot tolerate medicines given to you by your health care provider.   You have new symptoms since your last visit with your health care provider.  SEEK IMMEDIATE MEDICAL CARE IF:   You feel weak.   You have decreased sensation or numbness.   You lose control of bowel or bladder function.   Your pain suddenly gets much worse.   You develop shaking.  You develop chills.  You  develop confusion.  You develop chest pain.  You develop shortness of breath.  MAKE SURE YOU:  Understand these instructions.  Will watch your condition.  Will get help right away if you are not doing well or get worse.   This information is not intended to replace advice given to you by your health care provider. Make sure you discuss any questions you have with your health care provider.   Document Released: 07/23/2002 Document Revised: 07/03/2013 Document Reviewed: 04/26/2013 Elsevier Interactive Patient  Education 2016 Elsevier Inc.  Pain Without a Known Cause WHAT IS PAIN WITHOUT A KNOWN CAUSE? Pain can occur in any part of the body and can range from mild to severe. Sometimes no cause can be found for why you are having pain. Some types of pain that can occur without a known cause include:   Headache.  Back pain.  Abdominal pain.  Neck pain. HOW IS PAIN WITHOUT A KNOWN CAUSE DIAGNOSED?  Your health care provider will try to find the cause of your pain. This may include:  Physical exam.  Medical history.  Blood tests.  Urine tests.  X-rays. If no cause is found, your health care provider may diagnose you with pain without a known cause.  IS THERE TREATMENT FOR PAIN WITHOUT A CAUSE?  Treatment depends on the kind of pain you have. Your health care provider may prescribe medicines to help relieve your pain.  WHAT CAN I DO AT HOME FOR MY PAIN?   Take medicines only as directed by your health care provider.  Stop any activities that cause pain. During periods of severe pain, bed rest may help.  Try to reduce your stress with activities such as yoga or meditation. Talk to your health care provider for other stress-reducing activity recommendations.  Exercise regularly, if approved by your health care provider.  Eat a healthy diet that includes fruits and vegetables. This may improve pain. Talk to your health care provider if you have any questions about your diet. WHAT IF MY PAIN DOES NOT GET BETTER?  If you have a painful condition and no reason can be found for the pain or the pain gets worse, it is important to follow up with your health care provider. It may be necessary to repeat tests and look further for a possible cause.    This information is not intended to replace advice given to you by your health care provider. Make sure you discuss any questions you have with your health care provider.   Document Released: 07/26/2001 Document Revised: 11/21/2014 Document  Reviewed: 03/18/2014 Elsevier Interactive Patient Education [employer].
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
## 2015-11-09 ENCOUNTER — Emergency Department (HOSPITAL_COMMUNITY)
Admission: EM | Admit: 2015-11-09 | Discharge: 2015-11-09 | Disposition: A | Discharge status: Home / Self Care | Payer: Self-pay | Attending: Emergency Medicine | Admitting: Emergency Medicine

## 2015-11-09 ENCOUNTER — Emergency Department (HOSPITAL_COMMUNITY): Payer: Self-pay

## 2015-11-09 ENCOUNTER — Encounter (HOSPITAL_COMMUNITY): Payer: Self-pay | Admitting: *Deleted

## 2015-11-09 DIAGNOSIS — Z79899 Other long term (current) drug therapy: Secondary | ICD-10-CM | POA: Insufficient documentation

## 2015-11-09 DIAGNOSIS — E1122 Type 2 diabetes mellitus with diabetic chronic kidney disease: Secondary | ICD-10-CM | POA: Insufficient documentation

## 2015-11-09 DIAGNOSIS — I129 Hypertensive chronic kidney disease with stage 1 through stage 4 chronic kidney disease, or unspecified chronic kidney disease: Secondary | ICD-10-CM | POA: Insufficient documentation

## 2015-11-09 DIAGNOSIS — E11319 Type 2 diabetes mellitus with unspecified diabetic retinopathy without macular edema: Secondary | ICD-10-CM | POA: Insufficient documentation

## 2015-11-09 DIAGNOSIS — Z87891 Personal history of nicotine dependence: Secondary | ICD-10-CM | POA: Insufficient documentation

## 2015-11-09 DIAGNOSIS — N189 Chronic kidney disease, unspecified: Secondary | ICD-10-CM | POA: Insufficient documentation

## 2015-11-09 DIAGNOSIS — Z8742 Personal history of other diseases of the female genital tract: Secondary | ICD-10-CM | POA: Insufficient documentation

## 2015-11-09 DIAGNOSIS — I10 Essential (primary) hypertension: Secondary | ICD-10-CM

## 2015-11-09 DIAGNOSIS — R05 Cough: Secondary | ICD-10-CM | POA: Insufficient documentation

## 2015-11-09 DIAGNOSIS — IMO0001 Reserved for inherently not codable concepts without codable children: Secondary | ICD-10-CM

## 2015-11-09 DIAGNOSIS — Z9119 Patient's noncompliance with other medical treatment and regimen: Secondary | ICD-10-CM | POA: Insufficient documentation

## 2015-11-09 DIAGNOSIS — R600 Localized edema: Secondary | ICD-10-CM | POA: Insufficient documentation

## 2015-11-09 DIAGNOSIS — Z862 Personal history of diseases of the blood and blood-forming organs and certain disorders involving the immune mechanism: Secondary | ICD-10-CM | POA: Insufficient documentation

## 2015-11-09 DIAGNOSIS — J45901 Unspecified asthma with (acute) exacerbation: Secondary | ICD-10-CM | POA: Insufficient documentation

## 2015-11-09 DIAGNOSIS — G629 Polyneuropathy, unspecified: Secondary | ICD-10-CM | POA: Insufficient documentation

## 2015-11-09 LAB — BASIC METABOLIC PANEL
Anion gap: 9 (ref 5–15)
BUN: 14 mg/dL (ref 6–20)
CHLORIDE: 102 mmol/L (ref 101–111)
CO2: 25 mmol/L (ref 22–32)
Calcium: 8.6 mg/dL — ABNORMAL LOW (ref 8.9–10.3)
Creatinine, Ser: 1.17 mg/dL — ABNORMAL HIGH (ref 0.44–1.00)
GFR, EST NON AFRICAN AMERICAN: 57 mL/min — AB (ref >60)
Glucose, Bld: 315 mg/dL — ABNORMAL HIGH (ref 65–99)
POTASSIUM: 4.4 mmol/L (ref 3.5–5.1)
SODIUM: 136 mmol/L (ref 135–145)

## 2015-11-09 LAB — CBC
HEMATOCRIT: 26.3 % — AB (ref 36.0–46.0)
Hemoglobin: 8 g/dL — ABNORMAL LOW (ref 12.0–15.0)
MCH: 22.4 pg — ABNORMAL LOW (ref 26.0–34.0)
MCHC: 30.4 g/dL (ref 30.0–36.0)
MCV: 73.7 fL — ABNORMAL LOW (ref 78.0–100.0)
PLATELETS: 389 10*3/uL (ref 150–400)
RBC: 3.57 MIL/uL — AB (ref 3.87–5.11)
RDW: 15.3 % (ref 11.5–15.5)
WBC: 7.4 10*3/uL (ref 4.0–10.5)

## 2015-11-09 LAB — BRAIN NATRIURETIC PEPTIDE: B NATRIURETIC PEPTIDE 5: 62 pg/mL (ref 0.0–100.0)

## 2015-11-09 LAB — I-STAT TROPONIN, ED: Troponin i, poc: 0 ng/mL (ref 0.00–0.08)

## 2015-11-09 MED ORDER — FUROSEMIDE 20 MG PO TABS: 20.0000 mg | ORAL_TABLET | Freq: Every day | ORAL | Status: DC

## 2015-11-09 MED ORDER — LISINOPRIL 10 MG PO TABS
5.0000 mg | ORAL_TABLET | Freq: Every day | ORAL | Status: DC
  Administered 2015-11-09: 5 mg via ORAL
  Filled 2015-11-09: qty 1

## 2015-11-09 NOTE — ED Notes (Signed)
Pt admits to a cough "for a while now" pt admits x2 days has been acutely worse w/ productive cough and yellow sputum - pt also admits to chest pain, shortness of breath, intermittent n/v and generalized body aches.

## 2015-11-09 NOTE — ED Provider Notes (Signed)
CSN: [id number]     Arrival date & time 11/09/15  1144 History   First MD Initiated Contact with Patient 11/09/15 1341     Chief Complaint  Patient presents with  . Cough  . Chest Pain  . Generalized Body Aches   Patient is a 39 y.o. female presenting with cough and chest pain.  Cough Associated symptoms: chest pain   Chest Pain Associated symptoms: cough    Pt started with cold sx, congestion and body aches that has been ongoing for a  Week.   Patient then started having cough that started 2 days ago. He has been bringing up some yellow sputum. She has had some intermittent discomfort in her chest as well as some shortness of breath with the coughing. She had some generalized body aches and myalgias. She has had some nausea but denies any fevers. And also has had trouble with swelling of her left lower extremity for years. She's had numerous evaluations in the past and includes MRIs and CT scans. Recently she had a Doppler study of her left lower extremity in October as well as an abdominal pelvic CT scan to evaluate for some sort of mass. Those tests were negative but she has not followed up with her doctor since that time. Patient is concerned because the leg is persistently swollen. He was given diuretics last time for just 5 days and is not really short helped that much but she feels like she needs to be on those medications now. She does have history of hypertension but does not take her medications regularly. Past Medical History  Diagnosis Date  . Asthma   . Hyperlipidemia   . Seasonal allergies   . Noncompliance with medications   . Peripheral neuropathy ([place])   . Diabetic retinopathy ([place])   . Iron deficiency anemia   . Diabetes mellitus     uncontrolled  . Menorrhagia   . Hypertension    Past Surgical History  Procedure Laterality Date  . Tubal ligation    . Cesarean section     Family History  Problem Relation Age of Onset  . Stroke Mother   . Diabetes Father   .  Diabetes Mother   . Diabetes Maternal Grandmother   . Diabetes Maternal Grandfather   . Hypertension Mother   . Hypertension Father   . Cancer Paternal Grandfather     Prostate  . Aneurysm Mother    Social History  Substance Use Topics  . Smoking status: Former Smoker -- 0.30 packs/day for 1 years    Types: Cigarettes    Quit date: 05/29/2015  . Smokeless tobacco: None  . Alcohol Use: 0.0 oz/week    0 Standard drinks or equivalent per week     Comment: once every 2 weeks   OB History    No data available     Review of Systems  Respiratory: Positive for cough.   Cardiovascular: Positive for chest pain.  All other systems reviewed and are negative.     Allergies  Review of patient's allergies indicates no known allergies.  Home Medications   Prior to Admission medications   Medication Sig Start Date End Date Taking? Authorizing Provider  albuterol (PROVENTIL HFA;VENTOLIN HFA) 108 (90 BASE) MCG/ACT inhaler Inhale 2 puffs into the lungs every 6 (six) hours as needed for wheezing or shortness of breath. 08/05/14  Yes Blain Pais, MD  insulin aspart protamine- aspart (NOVOLOG MIX 70/30) (70-30) 100 UNIT/ML injection Inject 0.2 mLs (20 Units  total) into the skin 2 (two) times daily with a meal. 09/07/15  Yes Carly J Rivet, MD  lisinopril (PRINIVIL,ZESTRIL) 5 MG tablet Take 1 tablet (5 mg total) by mouth daily. 09/07/15  Yes Carly Montey Hora, MD  furosemide (LASIX) 20 MG tablet Take 1 tablet (20 mg total) by mouth daily. 11/09/15   Dorie Rank, MD  gabapentin (NEURONTIN) 100 MG capsule Take 1 capsule (100 mg total) by mouth 3 (three) times daily as needed (pain). Patient not taking: Reported on 11/09/2015 10/16/15   Clayton Bibles, PA-C  metroNIDAZOLE (FLAGYL) 500 MG tablet Take 1 tablet (500 mg total) by mouth 2 (two) times daily. Patient not taking: Reported on 11/09/2015 08/31/15   Jorje Guild, NP   BP 164/81 mmHg  Pulse 99  Temp(Src) 99.5 F (37.5 C) (Oral)  Resp 16  Ht  5\' 5"  (1.651 m)  Wt 92.987 kg  BMI 34.11 kg/m2  SpO2 97%  LMP 09/30/2015 (Within Days) Physical Exam  Constitutional: She appears well-developed and well-nourished. No distress.  HENT:  Head: Normocephalic and atraumatic.  Right Ear: External ear normal.  Left Ear: External ear normal.  Eyes: Conjunctivae are normal. Right eye exhibits no discharge. Left eye exhibits no discharge. No scleral icterus.  Neck: Neck supple. No tracheal deviation present.  Cardiovascular: Normal rate, regular rhythm and intact distal pulses.   Pulmonary/Chest: Effort normal and breath sounds normal. No stridor. No respiratory distress. She has no wheezes. She has no rales.  Abdominal: Soft. Bowel sounds are normal. She exhibits no distension. There is no tenderness. There is no rebound and no guarding.  Musculoskeletal: She exhibits edema. She exhibits no tenderness.  Edema of the left lower extremity asymmetric to the right lower extremity, no calf tenderness  Neurological: She is alert. She has normal strength. No cranial nerve deficit (no facial droop, extraocular movements intact, no slurred speech) or sensory deficit. She exhibits normal muscle tone. She displays no seizure activity. Coordination normal.  Skin: Skin is warm and dry. No rash noted.  Psychiatric: She has a normal mood and affect.  Nursing note and vitals reviewed.   ED Course  Procedures (including critical care time) Labs Review Labs Reviewed  BASIC METABOLIC PANEL - Abnormal; Notable for the following:    Glucose, Bld 315 (*)    Creatinine, Ser 1.17 (*)    Calcium 8.6 (*)    GFR calc non Af Amer 57 (*)    All other components within normal limits  CBC - Abnormal; Notable for the following:    RBC 3.57 (*)    Hemoglobin 8.0 (*)    HCT 26.3 (*)    MCV 73.7 (*)    MCH 22.4 (*)    All other components within normal limits  BRAIN NATRIURETIC PEPTIDE  I-STAT TROPOININ, ED    Imaging Review Dg Chest 2 View  11/09/2015   CLINICAL DATA:  Cough, chest pain and shortness of breath EXAM: CHEST  2 VIEW COMPARISON:  01/14/2015 FINDINGS: Mild cardiac enlargement with basilar vascular/interstitial prominence suggesting early edema versus volume overload when compared to the prior study. Minor associated basilar atelectasis. Trace pleural effusions suspected. Upper lobes remain clear. No pneumothorax. Trachea is midline. Minor scoliosis of the spine. IMPRESSION: Cardiomegaly with developing minor basilar edema and associated atelectasis. Trace pleural effusions. Electronically Signed   By: Jerilynn Mages.  Shick M.D.   On: 11/09/2015 13:28   I have personally reviewed and evaluated these images and lab results as part of my medical decision-making.  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 Patient is not having any dyspnea. She does have hypertension and is not compliant with her medications. She be contributing to her peripheral edema as well as the cardiomegaly.  Discharge patient home on a course of Lasix. I stressed the importance of taking her blood pressure medications as well as following up with her primary care doctor.    Dorie Rank, MD 11/09/15 [phone]

## 2015-11-24 ENCOUNTER — Encounter (HOSPITAL_COMMUNITY): Payer: Self-pay | Admitting: Emergency Medicine

## 2015-11-24 ENCOUNTER — Emergency Department (HOSPITAL_COMMUNITY): Payer: BLUE CROSS/BLUE SHIELD

## 2015-11-24 DIAGNOSIS — J45901 Unspecified asthma with (acute) exacerbation: Secondary | ICD-10-CM | POA: Insufficient documentation

## 2015-11-24 DIAGNOSIS — G629 Polyneuropathy, unspecified: Secondary | ICD-10-CM | POA: Insufficient documentation

## 2015-11-24 DIAGNOSIS — D509 Iron deficiency anemia, unspecified: Secondary | ICD-10-CM | POA: Insufficient documentation

## 2015-11-24 DIAGNOSIS — N289 Disorder of kidney and ureter, unspecified: Secondary | ICD-10-CM | POA: Diagnosis not present

## 2015-11-24 DIAGNOSIS — D473 Essential (hemorrhagic) thrombocythemia: Secondary | ICD-10-CM | POA: Insufficient documentation

## 2015-11-24 DIAGNOSIS — Z794 Long term (current) use of insulin: Secondary | ICD-10-CM | POA: Diagnosis not present

## 2015-11-24 DIAGNOSIS — E11319 Type 2 diabetes mellitus with unspecified diabetic retinopathy without macular edema: Secondary | ICD-10-CM | POA: Diagnosis not present

## 2015-11-24 DIAGNOSIS — Z87891 Personal history of nicotine dependence: Secondary | ICD-10-CM | POA: Insufficient documentation

## 2015-11-24 DIAGNOSIS — R05 Cough: Secondary | ICD-10-CM | POA: Diagnosis not present

## 2015-11-24 DIAGNOSIS — R600 Localized edema: Secondary | ICD-10-CM | POA: Insufficient documentation

## 2015-11-24 DIAGNOSIS — I10 Essential (primary) hypertension: Secondary | ICD-10-CM | POA: Diagnosis not present

## 2015-11-24 DIAGNOSIS — Z8742 Personal history of other diseases of the female genital tract: Secondary | ICD-10-CM | POA: Insufficient documentation

## 2015-11-24 DIAGNOSIS — Z9119 Patient's noncompliance with other medical treatment and regimen: Secondary | ICD-10-CM | POA: Insufficient documentation

## 2015-11-24 DIAGNOSIS — Z79899 Other long term (current) drug therapy: Secondary | ICD-10-CM | POA: Insufficient documentation

## 2015-11-24 DIAGNOSIS — R011 Cardiac murmur, unspecified: Secondary | ICD-10-CM | POA: Insufficient documentation

## 2015-11-24 LAB — CBC
HEMATOCRIT: 26.2 % — AB (ref 36.0–46.0)
Hemoglobin: 7.6 g/dL — ABNORMAL LOW (ref 12.0–15.0)
MCH: 21.1 pg — ABNORMAL LOW (ref 26.0–34.0)
MCHC: 29 g/dL — ABNORMAL LOW (ref 30.0–36.0)
MCV: 72.6 fL — ABNORMAL LOW (ref 78.0–100.0)
PLATELETS: 458 10*3/uL — AB (ref 150–400)
RBC: 3.61 MIL/uL — AB (ref 3.87–5.11)
RDW: 15 % (ref 11.5–15.5)
WBC: 7.3 10*3/uL (ref 4.0–10.5)

## 2015-11-24 LAB — I-STAT TROPONIN, ED: Troponin i, poc: 0 ng/mL (ref 0.00–0.08)

## 2015-11-24 LAB — BASIC METABOLIC PANEL
Anion gap: 8 (ref 5–15)
BUN: 10 mg/dL (ref 6–20)
CHLORIDE: 99 mmol/L — AB (ref 101–111)
CO2: 30 mmol/L (ref 22–32)
CREATININE: 1.31 mg/dL — AB (ref 0.44–1.00)
Calcium: 8.5 mg/dL — ABNORMAL LOW (ref 8.9–10.3)
GFR, EST AFRICAN AMERICAN: 58 mL/min — AB (ref >60)
GFR, EST NON AFRICAN AMERICAN: 50 mL/min — AB (ref >60)
Glucose, Bld: 377 mg/dL — ABNORMAL HIGH (ref 65–99)
POTASSIUM: 4 mmol/L (ref 3.5–5.1)
SODIUM: 137 mmol/L (ref 135–145)

## 2015-11-24 NOTE — ED Notes (Signed)
Pt states she has had a productive cough for over a week. Pt states was seen here for cough and cough is no better. Pt states cough is making her chest and throat hurt. Pt states she has been sob at times also.

## 2015-11-25 ENCOUNTER — Emergency Department (HOSPITAL_COMMUNITY)
Admission: EM | Admit: 2015-11-25 | Discharge: 2015-11-25 | Disposition: A | Discharge status: Home / Self Care | Payer: BLUE CROSS/BLUE SHIELD | Attending: Emergency Medicine | Admitting: Emergency Medicine

## 2015-11-25 DIAGNOSIS — R059 Cough, unspecified: Secondary | ICD-10-CM

## 2015-11-25 DIAGNOSIS — N289 Disorder of kidney and ureter, unspecified: Secondary | ICD-10-CM

## 2015-11-25 DIAGNOSIS — R609 Edema, unspecified: Secondary | ICD-10-CM

## 2015-11-25 DIAGNOSIS — D473 Essential (hemorrhagic) thrombocythemia: Secondary | ICD-10-CM

## 2015-11-25 DIAGNOSIS — D75839 Thrombocytosis, unspecified: Secondary | ICD-10-CM

## 2015-11-25 DIAGNOSIS — R05 Cough: Secondary | ICD-10-CM

## 2015-11-25 DIAGNOSIS — D509 Iron deficiency anemia, unspecified: Secondary | ICD-10-CM

## 2015-11-25 MED ORDER — IPRATROPIUM-ALBUTEROL 0.5-2.5 (3) MG/3ML IN SOLN
3.0000 mL | Freq: Once | RESPIRATORY_TRACT | Status: AC
  Administered 2015-11-25: 3 mL via RESPIRATORY_TRACT
  Filled 2015-11-25: qty 3

## 2015-11-25 MED ORDER — FUROSEMIDE 40 MG PO TABS: 40.0000 mg | ORAL_TABLET | Freq: Every day | ORAL | Status: DC

## 2015-11-25 MED ORDER — ALBUTEROL SULFATE HFA 108 (90 BASE) MCG/ACT IN AERS: 2.0000 | INHALATION_SPRAY | RESPIRATORY_TRACT | Status: DC | PRN

## 2015-11-25 MED ORDER — FUROSEMIDE 10 MG/ML IJ SOLN
40.0000 mg | Freq: Once | INTRAMUSCULAR | Status: AC
  Administered 2015-11-25: 40 mg via INTRAVENOUS
  Filled 2015-11-25: qty 4

## 2015-11-25 NOTE — ED Notes (Signed)
Patient left with all her belongings and ambulated out of the treatment area.

## 2015-11-25 NOTE — Discharge Instructions (Signed)
Weigh yourself every day.  As you lose weight, and represents fluid that is lost. Once you reach your dry weight (your weight when all excess fluid is gone),  Then he will use that as a guide. If you start gaining weight, it means that you need to take more of ear fluid pill. Make sure to follow-up with the internal medicine clinic in one week. They may need to adjust your medications.  Edema Edema is an abnormal buildup of fluids in your bodytissues. Edema is somewhatdependent on gravity to pull the fluid to the lowest place in your body. That makes the condition more common in the legs and thighs (lower extremities). Painless swelling of the feet and ankles is common and becomes more likely as you get older. It is also common in looser tissues, like around your eyes.  When the affected area is squeezed, the fluid may move out of that spot and leave a dent for a few moments. This dent is called pitting.  CAUSES  There are many possible causes of edema. Eating too much salt and being on your feet or sitting for a long time can cause edema in your legs and ankles. Hot weather may make edema worse. Common medical causes of edema include:  Heart failure.  Liver disease.  Kidney disease.  Weak blood vessels in your legs.  Cancer.  An injury.  Pregnancy.  Some medications.  Obesity. SYMPTOMS  Edema is usually painless.Your skin may look swollen or shiny.  DIAGNOSIS  Your health care provider may be able to diagnose edema by asking about your medical history and doing a physical exam. You may need to have tests such as X-rays, an electrocardiogram, or blood tests to check for medical conditions that may cause edema.  TREATMENT  Edema treatment depends on the cause. If you have heart, liver, or kidney disease, you need the treatment appropriate for these conditions. General treatment may include:  Elevation of the affected body part above the level of your heart.  Compression of the  affected body part. Pressure from elastic bandages or support stockings squeezes the tissues and forces fluid back into the blood vessels. This keeps fluid from entering the tissues.  Restriction of fluid and salt intake.  Use of a water pill (diuretic). These medications are appropriate only for some types of edema. They pull fluid out of your body and make you urinate more often. This gets rid of fluid and reduces swelling, but diuretics can have side effects. Only use diuretics as directed by your health care provider. HOME CARE INSTRUCTIONS   Keep the affected body part above the level of your heart when you are lying down.   Do not sit still or stand for prolonged periods.   Do not put anything directly under your knees when lying down.  Do not wear constricting clothing or garters on your upper legs.   Exercise your legs to work the fluid back into your blood vessels. This may help the swelling go down.   Wear elastic bandages or support stockings to reduce ankle swelling as directed by your health care provider.   Eat a low-salt diet to reduce fluid if your health care provider recommends it.   Only take medicines as directed by your health care provider. SEEK MEDICAL CARE IF:   Your edema is not responding to treatment.  You have heart, liver, or kidney disease and notice symptoms of edema.  You have edema in your legs that does not  improve after elevating them.   You have sudden and unexplained weight gain. SEEK IMMEDIATE MEDICAL CARE IF:   You develop shortness of breath or chest pain.   You cannot breathe when you lie down.  You develop pain, redness, or warmth in the swollen areas.   You have heart, liver, or kidney disease and suddenly get edema.  You have a fever and your symptoms suddenly get worse. MAKE SURE YOU:   Understand these instructions.  Will watch your condition.  Will get help right away if you are not doing well or get worse.     This information is not intended to replace advice given to you by your health care provider. Make sure you discuss any questions you have with your health care provider.   Document Released: 10/31/2005 Document Revised: 11/21/2014 Document Reviewed: 08/23/2013 Elsevier Interactive Patient Education 2016 [employer]  Cough, Adult Coughing is a reflex that clears your throat and your airways. Coughing helps to heal and protect your lungs. It is normal to cough occasionally, but a cough that happens with other symptoms or lasts a long time may be a sign of a condition that needs treatment. A cough may last only 2-3 weeks (acute), or it may last longer than 8 weeks (chronic). CAUSES Coughing is commonly caused by:  Breathing in substances that irritate your lungs.  A viral or bacterial respiratory infection.  Allergies.  Asthma.  Postnasal drip.  Smoking.  Acid backing up from the stomach into the esophagus (gastroesophageal reflux).  Certain medicines.  Chronic lung problems, including COPD (or rarely, lung cancer).  Other medical conditions such as heart failure. HOME CARE INSTRUCTIONS  Pay attention to any changes in your symptoms. Take these actions to help with your discomfort:  Take medicines only as told by your health care provider.  If you were prescribed an antibiotic medicine, take it as told by your health care provider. Do not stop taking the antibiotic even if you start to feel better.  Talk with your health care provider before you take a cough suppressant medicine.  Drink enough fluid to keep your urine clear or pale yellow.  If the air is dry, use a cold steam vaporizer or humidifier in your bedroom or your home to help loosen secretions.  Avoid anything that causes you to cough at work or at home.  If your cough is worse at night, try sleeping in a semi-upright position.  Avoid cigarette smoke. If you smoke, quit smoking. If you need help quitting,  ask your health care provider.  Avoid caffeine.  Avoid alcohol.  Rest as needed. SEEK MEDICAL CARE IF:   You have new symptoms.  You cough up pus.  Your cough does not get better after 2-3 weeks, or your cough gets worse.  You cannot control your cough with suppressant medicines and you are losing sleep.  You develop pain that is getting worse or pain that is not controlled with pain medicines.  You have a fever.  You have unexplained weight loss.  You have night sweats. SEEK IMMEDIATE MEDICAL CARE IF:  You cough up blood.  You have difficulty breathing.  Your heartbeat is very fast.   This information is not intended to replace advice given to you by your health care provider. Make sure you discuss any questions you have with your health care provider.   Document Released: 04/29/2011 Document Revised: 07/22/2015 Document Reviewed: 01/07/2015 Elsevier Interactive Patient Education 2016 [employer]  Albuterol inhalation aerosol  What is this medicine? ALBUTEROL (al [place]) is a bronchodilator. It helps open up the airways in your lungs to make it easier to breathe. This medicine is used to treat and to prevent bronchospasm. This medicine may be used for other purposes; ask your health care provider or pharmacist if you have questions. What should I tell my health care provider before I take this medicine? They need to know if you have any of the following conditions: -diabetes -heart disease or irregular heartbeat -high blood pressure -pheochromocytoma -seizures -thyroid disease -an unusual or allergic reaction to albuterol, levalbuterol, sulfites, other medicines, foods, dyes, or preservatives -pregnant or trying to get pregnant -breast-feeding How should I use this medicine? This medicine is for inhalation through the mouth. Follow the directions on your prescription label. Take your medicine at regular intervals. Do not use more often than directed. Make  sure that you are using your inhaler correctly. Ask you doctor or health care provider if you have any questions. Talk to your pediatrician regarding the use of this medicine in children. Special care may be needed. Overdosage: If you think you have taken too much of this medicine contact a poison control center or emergency room at once. NOTE: This medicine is only for you. Do not share this medicine with others. What if I miss a dose? If you miss a dose, use it as soon as you can. If it is almost time for your next dose, use only that dose. Do not use double or extra doses. What may interact with this medicine? -anti-infectives like chloroquine and pentamidine -caffeine -cisapride -diuretics -medicines for colds -medicines for depression or for emotional or psychotic conditions -medicines for weight loss including some herbal products -methadone -some antibiotics like clarithromycin, erythromycin, levofloxacin, and linezolid -some heart medicines -steroid hormones like dexamethasone, cortisone, hydrocortisone -theophylline -thyroid hormones This list may not describe all possible interactions. Give your health care provider a list of all the medicines, herbs, non-prescription drugs, or dietary supplements you use. Also tell them if you smoke, drink alcohol, or use illegal drugs. Some items may interact with your medicine. What should I watch for while using this medicine? Tell your doctor or health care professional if your symptoms do not improve. Do not use extra albuterol. If your asthma or bronchitis gets worse while you are using this medicine, call your doctor right away. If your mouth gets dry try chewing sugarless gum or sucking hard candy. Drink water as directed. What side effects may I notice from receiving this medicine? Side effects that you should report to your doctor or health care professional as soon as possible: -allergic reactions like skin rash, itching or hives,  swelling of the face, lips, or tongue -breathing problems -chest pain -feeling faint or lightheaded, falls -high blood pressure -irregular heartbeat -fever -muscle cramps or weakness -pain, tingling, numbness in the hands or feet -vomiting Side effects that usually do not require medical attention (report to your doctor or health care professional if they continue or are bothersome): -cough -difficulty sleeping -headache -nervousness or trembling -stomach upset -stuffy or runny nose -throat irritation -unusual taste This list may not describe all possible side effects. Call your doctor for medical advice about side effects. You may report side effects to FDA at 1-800-FDA-1088. Where should I keep my medicine? Keep out of the reach of children. Store at room temperature between 15 and 30 degrees C (59 and 86 degrees F). The contents are under pressure and may burst  when exposed to heat or flame. Do not freeze. This medicine does not work as well if it is too cold. Throw away any unused medicine after the expiration date. Inhalers need to be thrown away after the labeled number of puffs have been used or by the expiration date; whichever comes first. Ventolin HFA should be thrown away 12 months after removing from foil pouch. Check the instructions that come with your medicine. NOTE: This sheet is a summary. It may not cover all possible information. If you have questions about this medicine, talk to your doctor, pharmacist, or health care provider.    2016, Elsevier/[employer] Standard. (2013-04-18 10:57:17)  Furosemide tablets What is this medicine? FUROSEMIDE (fyoor [place] se mide) is a diuretic. It helps you make more urine and to lose salt and excess water from your body. This medicine is used to treat high blood pressure, and edema or swelling from heart, kidney, or liver disease. This medicine may be used for other purposes; ask your health care provider or pharmacist if you have  questions. What should I tell my health care provider before I take this medicine? They need to know if you have any of these conditions: -abnormal blood electrolytes -diarrhea or vomiting -gout -heart disease -kidney disease, small amounts of urine, or difficulty passing urine -liver disease -thyroid disease -an unusual or allergic reaction to furosemide, sulfa drugs, other medicines, foods, dyes, or preservatives -pregnant or trying to get pregnant -breast-feeding How should I use this medicine? Take this medicine by mouth with a glass of water. Follow the directions on the prescription label. You may take this medicine with or without food. If it upsets your stomach, take it with food or milk. Do not take your medicine more often than directed. Remember that you will need to pass more urine after taking this medicine. Do not take your medicine at a time of day that will cause you problems. Do not take at bedtime. Talk to your pediatrician regarding the use of this medicine in children. While this drug may be prescribed for selected conditions, precautions do apply. Overdosage: If you think you have taken too much of this medicine contact a poison control center or emergency room at once. NOTE: This medicine is only for you. Do not share this medicine with others. What if I miss a dose? If you miss a dose, take it as soon as you can. If it is almost time for your next dose, take only that dose. Do not take double or extra doses. What may interact with this medicine? -aspirin and aspirin-like medicines -certain antibiotics -chloral hydrate -cisplatin -cyclosporine -digoxin -diuretics -laxatives -lithium -medicines for blood pressure -medicines that relax muscles for surgery -methotrexate -NSAIDs, medicines for pain and inflammation like ibuprofen, naproxen, or indomethacin -phenytoin -steroid medicines like prednisone or cortisone -sucralfate -thyroid hormones This list may not  describe all possible interactions. Give your health care provider a list of all the medicines, herbs, non-prescription drugs, or dietary supplements you use. Also tell them if you smoke, drink alcohol, or use illegal drugs. Some items may interact with your medicine. What should I watch for while using this medicine? Visit your doctor or health care professional for regular checks on your progress. Check your blood pressure regularly. Ask your doctor or health care professional what your blood pressure should be, and when you should contact him or her. If you are a diabetic, check your blood sugar as directed. You may need to be on a special diet  while taking this medicine. Check with your doctor. Also, ask how many glasses of fluid you need to drink a day. You must not get dehydrated. You may get drowsy or dizzy. Do not drive, use machinery, or do anything that needs mental alertness until you know how this drug affects you. Do not stand or sit up quickly, especially if you are an older patient. This reduces the risk of dizzy or fainting spells. Alcohol can make you more drowsy and dizzy. Avoid alcoholic drinks. This medicine can make you more sensitive to the sun. Keep out of the sun. If you cannot avoid being in the sun, wear protective clothing and use sunscreen. Do not use sun lamps or tanning beds/booths. What side effects may I notice from receiving this medicine? Side effects that you should report to your doctor or health care professional as soon as possible: -blood in urine or stools -dry mouth -fever or chills -hearing loss or ringing in the ears -irregular heartbeat -muscle pain or weakness, cramps -skin rash -stomach upset, pain, or nausea -tingling or numbness in the hands or feet -unusually weak or tired -vomiting or diarrhea -yellowing of the eyes or skin Side effects that usually do not require medical attention (report to your doctor or health care professional if they  continue or are bothersome): -headache -loss of appetite -unusual bleeding or bruising This list may not describe all possible side effects. Call your doctor for medical advice about side effects. You may report side effects to FDA at 1-800-FDA-1088. Where should I keep my medicine? Keep out of the reach of children. Store at room temperature between 15 and 30 degrees C (59 and 86 degrees F). Protect from light. Throw away any unused medicine after the expiration date. NOTE: This sheet is a summary. It may not cover all possible information. If you have questions about this medicine, talk to your doctor, pharmacist, or health care provider.    2016, Elsevier/[employer] Standard. (2015-01-21 13:49:50)

## 2015-11-25 NOTE — ED Provider Notes (Signed)
CSN: [id number]     Arrival date & time 11/24/15  2041 History  By signing my name below, I, Jane [name], attest that this documentation has been prepared under the direction and in the presence of Jane Fuel, MD. Electronically Signed: Soijett [name], ED Scribe. 11/25/2015. 1:47 AM.   Chief Complaint  Patient presents with  . Chest Pain  . Cough      The history is provided by the patient. No language interpreter was used.    HPI Comments:  Jane [name] is a 40 y.o. female who presents to the Emergency Department complaining of mildly relieved nasal congestion onset 2 weeks.  She states that she is having associated symptoms of SOB, productive cough x yellow sputum, post-nasal drip, sore throat, appetite change, and bilateral lower leg edema. She states that she has tried Rx lasix with no relief for her symptoms. She denies fever, and any other symptoms.   She notes that she was seen in the ED on 12/26/20016 for CP and cough symptoms and had imaging, labs, and was Rx lasix which has not alleviated her symptoms. She reports that she has been taking the lasix as Rx to her from her last ED visit and it hasn't done much help for her bilateral leg edema. She states that she has an appointment with an neurologist for further evaluation of her neuropathy tomorrow.    Past Medical History  Diagnosis Date  . Asthma   . Hyperlipidemia   . Seasonal allergies   . Noncompliance with medications   . Peripheral neuropathy ([place])   . Diabetic retinopathy ([place])   . Iron deficiency anemia   . Diabetes mellitus     uncontrolled  . Menorrhagia   . Hypertension    Past Surgical History  Procedure Laterality Date  . Tubal ligation    . Cesarean section     Family History  Problem Relation Age of Onset  . Stroke Mother   . Diabetes Father   . Diabetes Mother   . Diabetes Maternal Grandmother   . Diabetes Maternal Grandfather   . Hypertension Mother   . Hypertension Father   . Cancer  Paternal Grandfather     Prostate  . Aneurysm Mother    Social History  Substance Use Topics  . Smoking status: Former Smoker -- 0.30 packs/day for 1 years    Types: Cigarettes    Quit date: 05/29/2015  . Smokeless tobacco: None  . Alcohol Use: 0.0 oz/week    0 Standard drinks or equivalent per week     Comment: once every 2 weeks   OB History    No data available     Review of Systems  Constitutional: Negative for fever.  HENT: Positive for congestion, postnasal drip and sore throat.   Respiratory: Positive for cough and shortness of breath.   Cardiovascular: Positive for leg swelling (bilateral ).  All other systems reviewed and are negative.     Allergies  Review of patient's allergies indicates no known allergies.  Home Medications   Prior to Admission medications   Medication Sig Start Date End Date Taking? Authorizing Provider  albuterol (PROVENTIL HFA;VENTOLIN HFA) 108 (90 BASE) MCG/ACT inhaler Inhale 2 puffs into the lungs every 6 (six) hours as needed for wheezing or shortness of breath. 08/05/14   Blain Pais, MD  furosemide (LASIX) 20 MG tablet Take 1 tablet (20 mg total) by mouth daily. 11/09/15   Dorie Rank, MD  gabapentin (NEURONTIN) 100 MG capsule  Take 1 capsule (100 mg total) by mouth 3 (three) times daily as needed (pain). Patient not taking: Reported on 11/09/2015 10/16/15   Clayton Bibles, PA-C  insulin aspart protamine- aspart (NOVOLOG MIX 70/30) (70-30) 100 UNIT/ML injection Inject 0.2 mLs (20 Units total) into the skin 2 (two) times daily with a meal. 09/07/15   Carly J Rivet, MD  lisinopril (PRINIVIL,ZESTRIL) 5 MG tablet Take 1 tablet (5 mg total) by mouth daily. 09/07/15   Carly Montey Hora, MD  metroNIDAZOLE (FLAGYL) 500 MG tablet Take 1 tablet (500 mg total) by mouth 2 (two) times daily. Patient not taking: Reported on 11/09/2015 08/31/15   Jorje Guild, NP   BP 179/70 mmHg  Pulse 106  Temp(Src) 98.8 F (37.1 C) (Oral)  Resp 18  Ht 5\' 6"  (1.676  m)  Wt 205 lb (92.987 kg)  BMI 33.10 kg/m2  SpO2 100%  LMP 10/30/2015 Physical Exam  Constitutional: She is oriented to person, place, and time. She appears well-developed and well-nourished. No distress.  HENT:  Head: Normocephalic and atraumatic.  Eyes: EOM are normal. Pupils are equal, round, and reactive to light.  Neck: Normal range of motion. Neck supple. No JVD present.  Cardiovascular: Normal rate and regular rhythm.  Exam reveals no gallop and no friction rub.   Murmur heard. 2/6 systolic ejection at the upper left sternal border.   Pulmonary/Chest: Effort normal. She has wheezes. She has no rales. She exhibits no tenderness.  Slight wheeze noted with cough.   Abdominal: Soft. Bowel sounds are normal. She exhibits no distension and no mass. There is no tenderness.  Musculoskeletal: Normal range of motion.       Right lower leg: She exhibits edema.       Left lower leg: She exhibits edema.  2-3+ pitting edema bilaterally.  Lymphadenopathy:    She has no cervical adenopathy.  Neurological: She is alert and oriented to person, place, and time. No cranial nerve deficit. She exhibits normal muscle tone. Coordination normal.  Skin: Skin is warm and dry. No rash noted.  Psychiatric: She has a normal mood and affect. Her behavior is normal. Judgment and thought content normal.  Nursing note and vitals reviewed.   ED Course  Procedures (including critical care time) DIAGNOSTIC STUDIES: Oxygen Saturation is 100% on RA, nl by my interpretation.    COORDINATION OF CARE: 1:47 AM Discussed treatment plan with pt at bedside which includes labs, CXR, EKG, breathing treatment, lasix and pt agreed to plan.   Labs Review Results for orders placed or performed during the hospital encounter of [id number]  Basic metabolic panel  Result Value Ref Range   Sodium 137 135 - 145 mmol/L   Potassium 4.0 3.5 - 5.1 mmol/L   Chloride 99 (L) 101 - 111 mmol/L   CO2 30 22 - 32 mmol/L   Glucose, Bld  377 (H) 65 - 99 mg/dL   BUN 10 6 - 20 mg/dL   Creatinine, Ser 1.31 (H) 0.44 - 1.00 mg/dL   Calcium 8.5 (L) 8.9 - 10.3 mg/dL   GFR calc non Af Amer 50 (L) >60 mL/min   GFR calc Af Amer 58 (L) >60 mL/min   Anion gap 8 5 - 15  CBC  Result Value Ref Range   WBC 7.3 4.0 - 10.5 K/uL   RBC 3.61 (L) 3.87 - 5.11 MIL/uL   Hemoglobin 7.6 (L) 12.0 - 15.0 g/dL   HCT 26.2 (L) 36.0 - 46.0 %   MCV 72.6 (L) 78.0 -  100.0 fL   MCH 21.1 (L) 26.0 - 34.0 pg   MCHC 29.0 (L) 30.0 - 36.0 g/dL   RDW 15.0 11.5 - 15.5 %   Platelets 458 (H) 150 - 400 K/uL  I-stat troponin, ED (not at [facility])  Result Value Ref Range   Troponin i, poc 0.00 0.00 - 0.08 ng/mL   Comment 3           Imaging Review Dg Chest 2 View  11/24/2015  CLINICAL DATA:  55-year-old female with chest pain cough and shortness of breath EXAM: CHEST  2 VIEW COMPARISON:  Radiograph dated 11/09/2015 FINDINGS: The heart size and mediastinal contours are within normal limits. Both lungs are clear. The visualized skeletal structures are unremarkable. IMPRESSION: No active cardiopulmonary disease. Electronically Signed   By: Anner Crete M.D.   On: 11/24/2015 21:18   I have personally reviewed and evaluated these images and lab results as part of my medical decision-making.   MDM   Final diagnoses:  Peripheral edema  Cough  Microcytic anemia  Thrombocytosis (HCC)  Renal insufficiency     Cough of uncertain cause. There is suggestion of some element of bronchospasm so she is given and therapeutic trial of albuterol with ipratropium. Following this, she states cough has improved. Peripheral edema. Old records are reviewed and she was seen in the emergency department on December 26 with peripheral edema and was given a prescription for furosemide 20 mg. Clearly this is not effective. She was given 40 mg of furosemide intravenously and has started to demonstrate some diuresis. She is discharged with prescriptions for albuterol inhaler and  furosemide 40 mg and is referred back to her internal medicine clinic for management of her cough and edema. I have recommended that she start weighing herself daily to track her fluid gain and loss.  I personally performed the services described in this documentation, which was scribed in my presence. The recorded information has been reviewed and is accurate.      Jane Fuel, MD [id number]

## 2015-11-26 ENCOUNTER — Encounter: Payer: Self-pay | Admitting: Neurology

## 2015-11-26 ENCOUNTER — Ambulatory Visit (INDEPENDENT_AMBULATORY_CARE_PROVIDER_SITE_OTHER): Payer: BLUE CROSS/BLUE SHIELD | Admitting: Neurology

## 2015-11-26 VITALS — BP 131/83 | HR 94 | Ht 65.0 in | Wt 207.0 lb

## 2015-11-26 DIAGNOSIS — R600 Localized edema: Secondary | ICD-10-CM

## 2015-11-26 NOTE — Patient Instructions (Signed)

## 2015-11-26 NOTE — Progress Notes (Signed)
Reason for visit: Left leg pain  Referring physician: [place]  Jane [name] is a 40 y.o. female  History of present illness:  Jane [name] is a 40 year old right-handed black female with a history of diabetes and medical noncompliance. The patient has had significant problems with swelling of the legs that has worsened involving the left leg over the last 3 or 4 months. She has been to her primary care physician, she has had venous Doppler studies done that were reportedly unremarkable. She is having discomfort with the left leg and foot associated with swelling. When the swelling is worse, the pain is worse. She indicates that if she keeps her leg propped up for a couple days, the swelling will go down. The patient has been placed on a diuretic without complete benefit. She reports some numbness in the right hand that began 2 days ago. She has been to the emergency room on several occasions for an upper respiratory tract infection, but she is referred to this office for the leg pain. She does have some swelling in the right leg, but the swelling is much more severe on the left. She has not been able to wear a shoe on her left foot since October 2016. She denies any back pain or neck pain, she denies issues controlling the bowels or the bladder. She has not had any balance issues.  Past Medical History  Diagnosis Date  . Asthma   . Hyperlipidemia   . Seasonal allergies   . Noncompliance with medications   . Peripheral neuropathy ([place])   . Diabetic retinopathy ([place])   . Iron deficiency anemia   . Diabetes mellitus     uncontrolled  . Menorrhagia   . Hypertension     Past Surgical History  Procedure Laterality Date  . Tubal ligation    . Cesarean section      Family History  Problem Relation Age of Onset  . Stroke Mother   . Diabetes Mother   . Hypertension Mother   . Aneurysm Mother   . Diabetes Father   . Hypertension Father   . Diabetes Maternal Grandmother   .  Diabetes Maternal Grandfather   . Cancer Paternal Grandfather     Prostate  . Diabetes Sister   . Hypertension Sister     Social history:  reports that she quit smoking about 5 months ago. Her smoking use included Cigarettes. She has a .3 pack-year smoking history. She has never used smokeless tobacco. She reports that she drinks alcohol. She reports that she does not use illicit drugs.  Medications:  Prior to Admission medications   Medication Sig Start Date End Date Taking? Authorizing Provider  albuterol (PROVENTIL HFA;VENTOLIN HFA) 108 (90 Base) MCG/ACT inhaler Inhale 2 puffs into the lungs every 4 (four) hours as needed for wheezing or shortness of breath (or coughing). [id number]  Yes Delora Fuel, MD  furosemide (LASIX) 40 MG tablet Take 1 tablet (40 mg total) by mouth daily. [id number]  Yes Delora Fuel, MD  insulin aspart protamine- aspart (NOVOLOG MIX 70/30) (70-30) 100 UNIT/ML injection Inject 0.2 mLs (20 Units total) into the skin 2 (two) times daily with a meal. 09/07/15  Yes Carly J Rivet, MD  lisinopril (PRINIVIL,ZESTRIL) 5 MG tablet Take 1 tablet (5 mg total) by mouth daily. Patient taking differently: Take 10 mg by mouth daily.  09/07/15  Yes Juliet Rude, MD     No Known Allergies  ROS:  Out of a complete  14 system review of symptoms, the patient complains only of the following symptoms, and all other reviewed systems are negative.  Fevers, chills, weight loss, fatigue Chest pain, swelling in the legs Difficulty swallowing Birthmarks, itching Shortness of breath, cough, wheezing, snoring Anemia, easy bruising Feeling cold Muscle cramps, aching muscles Allergies, runny nose Headache, numbness, weakness, difficulty swallowing, dizziness Change in appetite  Blood pressure 131/83, pulse 94, height 5\' 5"  (1.651 m), weight 207 lb (93.895 kg), last menstrual period 10/30/2015.  Physical Exam  General: The patient is alert and cooperative at the time of the  examination.  Eyes: Pupils are equal, round, and reactive to light. Discs are flat bilaterally.  Neck: The neck is supple, no carotid bruits are noted.  Respiratory: The respiratory examination is clear.  Cardiovascular: The cardiovascular examination reveals a regular rate and rhythm, no obvious murmurs or rubs are noted.  Skin: Extremities are with 2+ edema on the right leg below the knee, 4+ edema on the left.  Neurologic Exam  Mental status: The patient is alert and oriented x 3 at the time of the examination. The patient has apparent normal recent and remote memory, with an apparently normal attention span and concentration ability.  Cranial nerves: Facial symmetry is present. There is good sensation of the face to pinprick and soft touch bilaterally. The strength of the facial muscles and the muscles to head turning and shoulder shrug are normal bilaterally. Speech is well enunciated, no aphasia or dysarthria is noted. Extraocular movements are full. Visual fields are full. The tongue is midline, and the patient has symmetric elevation of the soft palate. No obvious hearing deficits are noted.  Motor: The motor testing reveals 5 over 5 strength of all 4 extremities. Good symmetric motor tone is noted throughout.  Sensory: Sensory testing is intact to pinprick, soft touch, vibration sensation, and position sense on the upper extremities. With the lower extremities, there is a stocking pattern pinprick sensory deficit across the ankles bilaterally. There is a significant decrease in vibration sensation and position sense on both feet, slightly worse on the left. No evidence of extinction is noted.  Coordination: Cerebellar testing reveals good finger-nose-finger and heel-to-shin bilaterally.  Gait and station: Gait is associated with a limping quality on the left leg. Tandem gait was not attempted. Romberg is negative.  Reflexes: Deep tendon reflexes are symmetric, but are depressed  bilaterally. Toes are downgoing bilaterally.   Assessment/Plan:  1. Diabetes  2. Medical noncompliance  3. Probable diabetic peripheral neuropathy  4. Severe peripheral edema, left leg   5. Iron deficiency anemia  The patient comes to this office mainly because of the left leg pain associated with significant edema. The patient does have a neuropathy, but this is likely not the source of her pain. The pain should improve if the edema can be ameliorated. She was given a prescription for a compression stocking for the left leg, mid thigh level. The patient is to elevate the left leg is much is possible. She is to continue her diuretic therapy. She will follow-up through this office on an as-needed basis. She has significant problems with iron deficiency anemia, I have encouraged her to get back on her iron therapy.  Jill Alexanders MD 11/26/2015 6:40 PM  [facility] [address] [place], [place] [address]  Phone [phone] Fax [phone]

## 2015-12-01 ENCOUNTER — Telehealth: Payer: Self-pay | Admitting: Neurology

## 2015-12-01 NOTE — Telephone Encounter (Signed)
Pt called requesting letter for work. Her work needs clarification on the time frame in which pt needs to make a change at work. Please fax to [phone]

## 2015-12-01 NOTE — Telephone Encounter (Signed)
If patient calls back we need more clarification. Im not sure what time frame they are asking for. Let her know. We need more information about the change at work.  LFt message for patient and more clarification for time frame at work.

## 2015-12-01 NOTE — Telephone Encounter (Signed)
Pt's called said her job requires a lot of walking. Pt said HR is filling out FMLA and is wanting her to do some sedentary work if possible. So what they need to know is what is the time frame of the healing process. Also how often does pt need to elevate leg. She said it does seem to be doing better since starting Lasix. She has ordered compression stocking but she is wearing compression socks until it comes in. She said she is able to wear boots now. Pt is requesting RN to touch base with her

## 2015-12-01 NOTE — Telephone Encounter (Signed)
error 

## 2015-12-01 NOTE — Telephone Encounter (Signed)
[name] with [employer] [facility] [employer] services (employer) called. Said pt turned in letter but they will require additional information. She is not calling to get this information as the patient has to but she wanted give the provider a heads up that it will be needed. She said the pt works odd hours and is wanting to help her in any way she can. She will need clarification on the time frame in which pt needs to make a change at work. This information can be faxed to [phone]

## 2015-12-01 NOTE — Telephone Encounter (Signed)
If patient calls back, I will talk with Dr.Willis about a letter. He is seeing patients today and is busy

## 2015-12-02 NOTE — Telephone Encounter (Signed)
I called the patient and explained that, per Dr. Jannifer Franklin, her condition is permanent. It is not expected to completely resolve. Any modifications made at work to allow her to sit and elevate her leg will need to be permanent. Letter has been updated, signed and faxed to [facility] [employer] [facility] at number provided.

## 2015-12-02 NOTE — Telephone Encounter (Signed)
I called Stanton Kidney. She needed to know if elevating the patient's leg is required or a suggestion. The patient's job requires her to stand and walk most of the day. After clarifying with Dr. Jannifer Franklin, I called her back to tell her that the patient may continue to work but on breaks she should elevate her leg. I left a voicemail asking her to call me back.

## 2015-12-02 NOTE — Telephone Encounter (Signed)
Jane [name] with [employer] is calling. She did receive the fax regarding the patient but has questions about the letter because the patient is on her feet continually. Please call.

## 2015-12-02 NOTE — Telephone Encounter (Signed)
[name]/[employer] [phone] called regarding letter that needs revision. Letter needs work modification due to health condition and anticipated period of time that modification would be needed. Secure Fax# [phone].

## 2015-12-03 NOTE — Telephone Encounter (Signed)
I called Jane [name]. I left a voicemail asking she call me back.

## 2015-12-04 ENCOUNTER — Telehealth: Payer: Self-pay | Admitting: *Deleted

## 2015-12-04 DIAGNOSIS — E113399 Type 2 diabetes mellitus with moderate nonproliferative diabetic retinopathy without macular edema, unspecified eye: Secondary | ICD-10-CM

## 2015-12-04 DIAGNOSIS — E1165 Type 2 diabetes mellitus with hyperglycemia: Secondary | ICD-10-CM

## 2015-12-04 NOTE — Telephone Encounter (Signed)
Call made to pt to inform her that office will be contacting her with an appt to have her eyes checked. No answer, message left on recorder .[name]1/20/20174:06 PM

## 2015-12-08 ENCOUNTER — Encounter: Payer: Self-pay | Admitting: Internal Medicine

## 2015-12-08 ENCOUNTER — Ambulatory Visit (INDEPENDENT_AMBULATORY_CARE_PROVIDER_SITE_OTHER): Payer: BLUE CROSS/BLUE SHIELD | Admitting: Internal Medicine

## 2015-12-08 VITALS — BP 160/86 | HR 100 | Temp 98.9°F | Ht 65.0 in | Wt 209.2 lb

## 2015-12-08 DIAGNOSIS — R600 Localized edema: Secondary | ICD-10-CM | POA: Diagnosis not present

## 2015-12-08 NOTE — Assessment & Plan Note (Addendum)
Assessment:  Unclear etiology.  Prior labs unremarkable.  CT abd/pelvis did not reveal lymph node enlargement. No HF symptoms.  Possibly chronic venous insufficiency vs lymphedema.  I doubt DVT given duration, neg [place] in Oct 2016, improvement with elevation and lasix.  She has not yet tried compression. Plan:  CMP, TSH today.  I will order Juxtalite compression for her.  She will purchase compression stockings and increase elevation for now.  She will follow-up with her PCP as previously scheduled in 3 weeks.

## 2015-12-08 NOTE — Patient Instructions (Signed)
1. Please buy compression stockings to and elevated your leg to help with leg swelling.  I will call you if there are problems with your labwork.  2. Please take all medications as prescribed.    3. If you have worsening of your symptoms or new symptoms arise, please call the clinic [phone]), or go to the ER immediately if symptoms are severe.   I will ask the social worker to contact you about housing resources.  Please come back to see Dr. Naaman Plummer on 01/01/16.

## 2015-12-08 NOTE — Progress Notes (Signed)
Subjective:    Patient ID: Jane [name], female    DOB: 1976/03/25, 40 y.o.   MRN: [id number]  HPI Comments: Jane [name] is a 40 year old woman with PMH as below here with c/o left leg swelling x 6 months, worse in past 3 months.  No VTE hx. She is on her feet a lot at work.  She was prescribed lasix by ED provider.  She has been taking lasix 40mg  daily and this is helping her.    Past Medical History  Diagnosis Date  . Asthma   . Hyperlipidemia   . Seasonal allergies   . Noncompliance with medications   . Peripheral neuropathy ([place])   . Diabetic retinopathy ([place])   . Iron deficiency anemia   . Diabetes mellitus     uncontrolled  . Menorrhagia   . Hypertension    Current Outpatient Prescriptions on File Prior to Visit  Medication Sig Dispense Refill  . albuterol (PROVENTIL HFA;VENTOLIN HFA) 108 (90 Base) MCG/ACT inhaler Inhale 2 puffs into the lungs every 4 (four) hours as needed for wheezing or shortness of breath (or coughing). 1 Inhaler 2  . furosemide (LASIX) 40 MG tablet Take 1 tablet (40 mg total) by mouth daily. 30 tablet 0  . insulin aspart protamine- aspart (NOVOLOG MIX 70/30) (70-30) 100 UNIT/ML injection Inject 0.2 mLs (20 Units total) into the skin 2 (two) times daily with a meal. 10 mL 11  . lisinopril (PRINIVIL,ZESTRIL) 5 MG tablet Take 1 tablet (5 mg total) by mouth daily. (Patient taking differently: Take 10 mg by mouth daily. ) 30 tablet 5   No current facility-administered medications on file prior to visit.    Review of Systems  Constitutional: Negative for fever, chills and appetite change.  Respiratory: Negative for shortness of breath.   Cardiovascular: Positive for leg swelling. Negative for palpitations.       Per HPI  Gastrointestinal: Positive for nausea. Negative for vomiting, abdominal pain, diarrhea, constipation and blood in stool.  Endocrine: Positive for cold intolerance and heat intolerance. Negative for polydipsia and polyuria.    Neurological: Positive for dizziness. Negative for syncope.       Filed Vitals:   12/08/15 0958  BP: 160/86  Pulse: 100  Temp: 98.9 F (37.2 C)  TempSrc: Oral  Height: 5\' 5"  (1.651 m)  Weight: 209 lb 3.2 oz (94.892 kg)  SpO2: 100%     Objective:   Physical Exam  Constitutional: She is oriented to person, place, and time. She appears well-developed. No distress.  HENT:  Head: Normocephalic and atraumatic.  Mouth/Throat: Oropharynx is clear and moist. No oropharyngeal exudate.  Eyes: EOM are normal. Pupils are equal, round, and reactive to light.  Neck: Neck supple.  Cardiovascular: Normal rate, regular rhythm and normal heart sounds.  Exam reveals no gallop and no friction rub.   No murmur heard. Pulmonary/Chest: Effort normal and breath sounds normal. No respiratory distress. She has no wheezes. She has no rales.  Abdominal: Soft. Bowel sounds are normal. She exhibits no distension and no mass. There is no tenderness. There is no rebound and no guarding.  There is no inguinal lymphadenopathy.   Musculoskeletal: Normal range of motion. She exhibits edema and tenderness.  2+ pitting edema left extremity foot to knee.  Neurological: She is alert and oriented to person, place, and time. No cranial nerve deficit.  Skin: Skin is warm. She is not diaphoretic. No erythema.  Psychiatric: She has a normal mood and affect.  Her behavior is normal. Judgment and thought content normal.  Vitals reviewed.         Assessment & Plan:  Please see problem based charting for A&P.

## 2015-12-09 ENCOUNTER — Other Ambulatory Visit: Payer: Self-pay | Admitting: Internal Medicine

## 2015-12-09 DIAGNOSIS — Z591 Inadequate housing: Secondary | ICD-10-CM

## 2015-12-09 LAB — CMP14 + ANION GAP
A/G RATIO: 0.8 — AB (ref 1.1–2.5)
ALK PHOS: 111 IU/L (ref 39–117)
ALT: 10 IU/L (ref 0–32)
ANION GAP: 16 mmol/L (ref 10.0–18.0)
AST: 13 IU/L (ref 0–40)
Albumin: 3.1 g/dL — ABNORMAL LOW (ref 3.5–5.5)
BUN/Creatinine Ratio: 14 (ref 9–23)
BUN: 19 mg/dL (ref 6–24)
Bilirubin Total: <0.2 mg/dL (ref 0.0–1.2)
CALCIUM: 8.9 mg/dL (ref 8.7–10.2)
CHLORIDE: 97 mmol/L (ref 96–106)
CO2: 25 mmol/L (ref 18–29)
Creatinine, Ser: 1.36 mg/dL — ABNORMAL HIGH (ref 0.57–1.00)
GFR calc Af Amer: 56 mL/min/{1.73_m2} — ABNORMAL LOW (ref >59)
GFR, EST NON AFRICAN AMERICAN: 49 mL/min/{1.73_m2} — AB (ref >59)
GLUCOSE: 281 mg/dL — AB (ref 65–99)
Globulin, Total: 3.9 g/dL (ref 1.5–4.5)
POTASSIUM: 4.6 mmol/L (ref 3.5–5.2)
Sodium: 138 mmol/L (ref 134–144)
Total Protein: 7 g/dL (ref 6.0–8.5)

## 2015-12-09 LAB — TSH: TSH: 0.788 u[IU]/mL (ref 0.450–4.500)

## 2015-12-09 NOTE — Progress Notes (Signed)
Case discussed with Dr. Wilson soon after the resident saw the patient. We reviewed the resident's history and exam and pertinent patient test results. I agree with the assessment, diagnosis, and plan of care documented in the resident's note. 

## 2015-12-10 ENCOUNTER — Encounter: Payer: Self-pay | Admitting: Internal Medicine

## 2015-12-10 ENCOUNTER — Telehealth: Payer: Self-pay | Admitting: Licensed Clinical Social Worker

## 2015-12-10 ENCOUNTER — Ambulatory Visit (INDEPENDENT_AMBULATORY_CARE_PROVIDER_SITE_OTHER): Payer: BLUE CROSS/BLUE SHIELD | Admitting: Internal Medicine

## 2015-12-10 VITALS — BP 162/82 | HR 99 | Temp 99.1°F | Ht 65.0 in | Wt 208.5 lb

## 2015-12-10 DIAGNOSIS — E1165 Type 2 diabetes mellitus with hyperglycemia: Secondary | ICD-10-CM | POA: Diagnosis not present

## 2015-12-10 DIAGNOSIS — Z794 Long term (current) use of insulin: Secondary | ICD-10-CM

## 2015-12-10 DIAGNOSIS — R600 Localized edema: Secondary | ICD-10-CM | POA: Diagnosis not present

## 2015-12-10 DIAGNOSIS — E113399 Type 2 diabetes mellitus with moderate nonproliferative diabetic retinopathy without macular edema, unspecified eye: Secondary | ICD-10-CM

## 2015-12-10 LAB — POCT GLYCOSYLATED HEMOGLOBIN (HGB A1C): HEMOGLOBIN A1C: 10.8

## 2015-12-10 LAB — GLUCOSE, CAPILLARY: GLUCOSE-CAPILLARY: 190 mg/dL — AB (ref 65–99)

## 2015-12-10 NOTE — Patient Instructions (Signed)
1. Please use your compression stocking and elevate your leg when possible.  Someone will call you to set up the imaging of your left leg.     2. Please take all medications as prescribed.    3. If you have worsening of your symptoms or new symptoms arise, please call the clinic [phone]), or go to the ER immediately if symptoms are severe.

## 2015-12-10 NOTE — Telephone Encounter (Signed)
[name] was referred to CSW for housing resources.  CSW placed call to Ms. Laurence Ferrari.  Pt states she is in need of her own apartment, 2 bedroom, no credit check and rent under $600/month.  CSW discussed several resources including www.nchousingsearch.  Pt was unaware of this resources.  CSW offered to print listing from this site as well as additional resources that may help with down payment assistance.  Letter mailed with search results from housing site, information on [employer] and [employer].  CSW confirmed address, pt denies add'l social work needs at this time.

## 2015-12-11 ENCOUNTER — Telehealth: Payer: Self-pay | Admitting: *Deleted

## 2015-12-11 LAB — MICROALBUMIN / CREATININE URINE RATIO
Creatinine, Urine: 182.6 mg/dL
MICROALB/CREAT RATIO: 2839.7 mg/g creat — ABNORMAL HIGH (ref 0.0–30.0)
Microalbumin, Urine: 5185.3 ug/mL

## 2015-12-11 NOTE — Telephone Encounter (Signed)
RTC to patient and RTC to Clinics about note for work.  Patient needs a note to be out of work to allow the swelling to go down in her lower leg.  Patient was sent home from work last night and was advised to stay out until the swelling goes down.  Spoke wie Dr. Redmond Pulling who will write a letter for patient to be out of work until 12/21/2015.  Patient agreed with date  and will pick up.  Patient will also drop off FMLA papers to be completed as well. Sander Nephew, RN 12/11/2015 10:40 AM

## 2015-12-13 NOTE — Assessment & Plan Note (Signed)
Assessment:  It is still unclear what is causing her unilateral edema.  Recent labs show slight increase in Cr which is likely due to Lasix and a low albumin.   Plan:  Given her poorly controlled DM, will investigate possibility of nephrotic syndrome (though still less likely given unilateral vs B/L edema) - checking urine protein.  Also, wonder if this may be due to May-Thurner (given unilateral left sided edema) though no clot seen on imaging (clot could be higher up).  Will talk to IR physician about appropriateness of CT venogram to evaluate for May Thurner in this patient.  Either way, she warrants referral to vascular given severe edema and now she is at risk of losing her job due to inability to comply with safety boot.  She was provided with a pair of compression stockings today and will work on increasing elevation.  She says she will look into possibly buying a larger size boot for her left foot for the time being.

## 2015-12-13 NOTE — Progress Notes (Signed)
Subjective:    Patient ID: Jane [name], female    DOB: 09/04/1976, 40 y.o.   MRN: [id number]  HPI Comments: Jane [name] is a 40 year old woman here with 6 month hx of chronic left LE swelling.  I saw her for the same problem two days ago and recommended compression stockings and elevation.  She has not yet gotten the stockings.  She came back today because HR has sent her home from work due to safety concerns (she is unable to fit her safety boot over her left foot so she is wearing sneakers).    Past Medical History  Diagnosis Date  . Asthma   . Hyperlipidemia   . Seasonal allergies   . Noncompliance with medications   . Peripheral neuropathy ([place])   . Diabetic retinopathy ([place])   . Iron deficiency anemia   . Diabetes mellitus     uncontrolled  . Menorrhagia   . Hypertension    Current Outpatient Prescriptions on File Prior to Visit  Medication Sig Dispense Refill  . albuterol (PROVENTIL HFA;VENTOLIN HFA) 108 (90 Base) MCG/ACT inhaler Inhale 2 puffs into the lungs every 4 (four) hours as needed for wheezing or shortness of breath (or coughing). 1 Inhaler 2  . furosemide (LASIX) 40 MG tablet Take 1 tablet (40 mg total) by mouth daily. 30 tablet 0  . insulin aspart protamine- aspart (NOVOLOG MIX 70/30) (70-30) 100 UNIT/ML injection Inject 0.2 mLs (20 Units total) into the skin 2 (two) times daily with a meal. 10 mL 11  . lisinopril (PRINIVIL,ZESTRIL) 5 MG tablet Take 1 tablet (5 mg total) by mouth daily. (Patient taking differently: Take 10 mg by mouth daily. ) 30 tablet 5   No current facility-administered medications on file prior to visit.    Review of Systems  Constitutional: Negative for fever, chills and appetite change.  Cardiovascular: Positive for leg swelling. Negative for chest pain and palpitations.  Gastrointestinal: Negative for abdominal pain.       Filed Vitals:   12/10/15 0938  BP: 162/82  Pulse: 99  Temp: 99.1 F (37.3 C)  TempSrc: Oral    Height: 5\' 5"  (1.651 m)  Weight: 208 lb 8 oz (94.575 kg)  SpO2: 100%     Objective:   Physical Exam  Constitutional: She is oriented to person, place, and time. She appears well-developed. No distress.  HENT:  Head: Normocephalic and atraumatic.  Mouth/Throat: Oropharynx is clear and moist. No oropharyngeal exudate.  Eyes: Conjunctivae and EOM are normal. Pupils are equal, round, and reactive to light. Right eye exhibits no discharge. Left eye exhibits no discharge. No scleral icterus.  Neck: Neck supple.  Cardiovascular: Normal rate, regular rhythm and normal heart sounds.  Exam reveals no gallop and no friction rub.   No murmur heard. Pulmonary/Chest: Effort normal. No respiratory distress. She has no wheezes. She has no rales.  Abdominal: Soft. Bowel sounds are normal. She exhibits no distension and no mass. There is no tenderness. There is no rebound and no guarding.  Musculoskeletal: Normal range of motion. She exhibits edema and tenderness.  2+ left leg edema foot to knee.  Mild TTP.  Neurological: She is alert and oriented to person, place, and time. No cranial nerve deficit.  Skin: Skin is warm. She is not diaphoretic.  Psychiatric: She has a normal mood and affect. Her behavior is normal. Judgment and thought content normal.  Vitals reviewed.         Assessment & Plan:  Please see problem based charting for A&P.

## 2015-12-14 NOTE — Progress Notes (Signed)
Internal Medicine Clinic Attending  I saw and evaluated the patient.  I personally confirmed the key portions of the history and exam documented by Dr. Redmond Pulling and I reviewed pertinent patient test results.  The assessment, diagnosis, and plan were formulated together and I agree with the documentation in the resident's note.  Interesting case of a young fairly healthy woman presenting for follow up of chronic unilateral left leg edema. The left leg is 3+ pitting edema and starting to get stasis changes around the [facility]. The right leg has trace pitting edema. PVL was negative for DVT recently, CT abdomen negative for compressive adenopathy. I have no other clear explanation for why she has so much unilateral edema for several month. May-Thurner could be an explanation, and potentially reversible. 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 If there is obstruction, stenting may improve her symptoms.

## 2015-12-14 NOTE — Addendum Note (Signed)
Addended by: Lalla Brothers T on: 12/14/2015 03:27 PM   Modules accepted: Orders

## 2015-12-17 ENCOUNTER — Other Ambulatory Visit: Payer: Self-pay | Admitting: Internal Medicine

## 2015-12-17 ENCOUNTER — Telehealth: Payer: Self-pay | Admitting: Internal Medicine

## 2015-12-17 DIAGNOSIS — R7989 Other specified abnormal findings of blood chemistry: Secondary | ICD-10-CM

## 2015-12-17 DIAGNOSIS — M7989 Other specified soft tissue disorders: Secondary | ICD-10-CM

## 2015-12-17 NOTE — Telephone Encounter (Signed)
I attempted to call patient x 2 but got voicemail.  I left a message asking her to call clinic.  I wanted to discuss the following:  1.  She should stop Lasix and return to clinic for repeat BMP and urine pregnancy test (because she will get contrast for CT venogram) early next week. 2.  CT venogram has been ordered but will likely take a few days for insurance approval. 3.  I have completed her FMLA paperwork and it is ready for her to pick up in clinic.  I noticed she checked the box asking that her insurance be temporarily stopped while she is out on FMLA.  I would not recommend this because I am sending her for testing and referring her to a specialist (vascular) and she will need insurance coverage for these procedures.

## 2015-12-18 ENCOUNTER — Telehealth: Payer: Self-pay | Admitting: Internal Medicine

## 2015-12-18 NOTE — Telephone Encounter (Signed)
That is fine 

## 2015-12-18 NOTE — Telephone Encounter (Signed)
Patient states she needs more time off for her swelling to go down in her legs and would like an extension to return to work on 12/28/2015 instead of 12/21/2015.

## 2015-12-21 ENCOUNTER — Encounter: Payer: Self-pay | Admitting: *Deleted

## 2015-12-21 ENCOUNTER — Telehealth: Payer: Self-pay | Admitting: Surgery

## 2015-12-21 ENCOUNTER — Encounter: Payer: Self-pay | Admitting: Internal Medicine

## 2015-12-21 ENCOUNTER — Ambulatory Visit (INDEPENDENT_AMBULATORY_CARE_PROVIDER_SITE_OTHER): Payer: BLUE CROSS/BLUE SHIELD | Admitting: Internal Medicine

## 2015-12-21 VITALS — BP 135/77 | HR 97 | Temp 98.6°F | Wt 204.0 lb

## 2015-12-21 DIAGNOSIS — I890 Lymphedema, not elsewhere classified: Secondary | ICD-10-CM

## 2015-12-21 DIAGNOSIS — I83029 Varicose veins of left lower extremity with ulcer of unspecified site: Secondary | ICD-10-CM

## 2015-12-21 DIAGNOSIS — E1121 Type 2 diabetes mellitus with diabetic nephropathy: Secondary | ICD-10-CM

## 2015-12-21 DIAGNOSIS — R600 Localized edema: Secondary | ICD-10-CM | POA: Diagnosis not present

## 2015-12-21 DIAGNOSIS — E1165 Type 2 diabetes mellitus with hyperglycemia: Secondary | ICD-10-CM | POA: Diagnosis not present

## 2015-12-21 DIAGNOSIS — E113399 Type 2 diabetes mellitus with moderate nonproliferative diabetic retinopathy without macular edema, unspecified eye: Secondary | ICD-10-CM

## 2015-12-21 DIAGNOSIS — L97929 Non-pressure chronic ulcer of unspecified part of left lower leg with unspecified severity: Secondary | ICD-10-CM

## 2015-12-21 DIAGNOSIS — M7989 Other specified soft tissue disorders: Secondary | ICD-10-CM

## 2015-12-21 DIAGNOSIS — I83229 Varicose veins of left lower extremity with both ulcer of unspecified site and inflammation: Secondary | ICD-10-CM

## 2015-12-21 DIAGNOSIS — Z8614 Personal history of Methicillin resistant Staphylococcus aureus infection: Secondary | ICD-10-CM

## 2015-12-21 DIAGNOSIS — E1129 Type 2 diabetes mellitus with other diabetic kidney complication: Secondary | ICD-10-CM

## 2015-12-21 MED ORDER — CLINDAMYCIN HCL 300 MG PO CAPS: 300.0000 mg | ORAL_CAPSULE | Freq: Three times a day (TID) | ORAL | Status: DC

## 2015-12-21 NOTE — Progress Notes (Signed)
Patient ID: Jane [name], female   DOB: 10-15-1976, 40 y.o.   MRN: [id number]   Subjective:   Patient ID: Jane [name] female   DOB: 01-Aug-1976 40 y.o.   MRN: [id number]  HPI: [name] is a 40 y.o. with PMH listed below presented for routine follow up visit, please see problem based charting for assessment and plan on issues addressed on todays visit.  Past Medical History  Diagnosis Date  . Asthma   . Hyperlipidemia   . Seasonal allergies   . Noncompliance with medications   . Peripheral neuropathy ([place])   . Diabetic retinopathy ([place])   . Iron deficiency anemia   . Diabetes mellitus     uncontrolled  . Menorrhagia   . Hypertension    Review of Systems: CONSTITUTIONAL- No Fever, weightloss, night sweat or change in appetite. SKIN- ulcer on foot. HEAD- No Headache or dizziness. RESPIRATORY- No Cough or SOB. CARDIAC- No Palpitations, or chest pain. GI- No vomiting, diarrhoea,abd pain. URINARY- No Frequency, or dysuria. NEUROLOGIC- No Numbness, or burning. [facility]- Denies depression or anxiety.  Objective:  Physical Exam: Filed Vitals:   12/21/15 1454  BP: 135/77  Pulse: 97  Temp: 98.6 F (37 C)  TempSrc: Oral  Weight: 204 lb (92.534 kg)  SpO2: 100%   GENERAL- alert, co-operative, appears as stated age, not in any distress. HEENT- Atraumatic, normocephalic, PERRL, neck supple. CARDIAC- RRR, no murmurs, rubs or gallops. RESP- Moving equal volumes of air, and clear to auscultation bilaterally, no wheezes or crackles. ABDOMEN- Soft, nontender,  bowel sounds present. BACK- Normal curvature of the spine, no CVA tenderness. NEURO- No obvious Cr N abnormality, strenght upper and lower extremities-intact, Gait- Normal. EXTREMITIES- left leg- pitting pedal edema, to knee, with ~4 by 3cm oval appearing ulcer with a greenish base, without surrounding erythema or tenderness, with foul smell. With venous stasis hyperpigmented skin changes- dermatitis  involving medial and lateral feet. SKIN- Warm, dry, No rash or lesion. PSYCH- Normal mood and affect, appropriate thought content and speech.  Assessment & Plan:   The patient's case and plan of care was discussed with attending physician, Dr. Dareen Piano.  Please see problem based charting for assessment and plan.

## 2015-12-21 NOTE — Addendum Note (Signed)
Addended by: Truddie Crumble on: 12/21/2015 04:41 PM   Modules accepted: Orders

## 2015-12-21 NOTE — Patient Instructions (Signed)
We will like you to hold off on your lasix for two days, the day before the CT of your abdomen and the day of the procedure.   This is very important.  Also we have prescribed a medication for you- Called Clindamycin- take one tablet three times a day for ten days.  We will also like you to see a vascular surgeon after your Ct scan.

## 2015-12-21 NOTE — Assessment & Plan Note (Addendum)
Leg swelling that initialy started in July. Pt is scheduled for a Ct Angio- abdomen and pelvis to evaluate the veins in her pelvis for possible compression- ?May thurner syndrome. She now has an ulcer on her leg, present for about a week now, with foul smelling drainage. Ulcer started as a blister and has rapidly progressed over the past week. She has been wearing the compression stockings, but that appears to be too tight, possible cutting up some blood supply, reducing drainage from her legs and worsening ulcer. She has no fever or chill, denies malaise. Patient tearful today.  Plan- Clindamycin to cover MRSA and anaerobs,  300mg  TID for 10 days.  Pt has a hx of MRSa infection. - CT Angio approved today, earliest it can be done- Thursday.  - She will not take her lasix- the day before and the day of the test. - Bmet today, Cr trending up slightly over the last three months - if Cr cont to increase consider reducing dose of lasix. - Refer to wound care. - Also has referral to VVS in the works.  Addendum- Pts Cr stable but slightly elevated at 1.2, doubt relation lasix, as increasing Cr over the past 6 months, predates first prescription of lasix. Most likely due to diabetic nephropathy with significant microalb/cr 2839, She is on low dose ACEi, consider increasing dose after CTA abd and pelvis with contrast.

## 2015-12-21 NOTE — Telephone Encounter (Signed)
Recvd referral on pt- LM for pt to call back and schedule appt, dpm

## 2015-12-21 NOTE — Assessment & Plan Note (Signed)
Lab Results  Component Value Date   HGBA1C 10.8 12/10/2015   HGBA1C >14.0 06/09/2015   HGBA1C 13.9* 08/15/2014     Assessment: Diabetes control:  Improved glycemic control per HgbA1c but still has a long way to go. Other plans: adjustments in regimen at her last visit, but still with no glucometer today.  - Pt was previously on metformin which was discont due to GI upset. - Consider oral hypoglycemics, pt is afraid of metformin and is very concerned about cost of medications, as she is now on private insurance. DPP $ and thiazolidindiones are options. Pt is more concerned about her legs and was not willing to talk about her Dm today. She will be seen in 1 week. Readdress issue. Hopefully we will have some results from the CT about her leg swelling.

## 2015-12-22 DIAGNOSIS — I83003 Varicose veins of unspecified lower extremity with ulcer of ankle: Secondary | ICD-10-CM | POA: Insufficient documentation

## 2015-12-22 DIAGNOSIS — E1121 Type 2 diabetes mellitus with diabetic nephropathy: Secondary | ICD-10-CM | POA: Insufficient documentation

## 2015-12-22 DIAGNOSIS — L97309 Non-pressure chronic ulcer of unspecified ankle with unspecified severity: Secondary | ICD-10-CM

## 2015-12-22 LAB — HCG, SERUM, QUALITATIVE: HCG, BETA SUBUNIT, QUAL, SERUM: NEGATIVE m[IU]/mL (ref <6)

## 2015-12-22 LAB — BMP8+ANION GAP
ANION GAP: 18 mmol/L (ref 10.0–18.0)
BUN/Creatinine Ratio: 17 (ref 9–23)
BUN: 22 mg/dL (ref 6–24)
CALCIUM: 8.3 mg/dL — AB (ref 8.7–10.2)
CHLORIDE: 96 mmol/L (ref 96–106)
CO2: 23 mmol/L (ref 18–29)
Creatinine, Ser: 1.26 mg/dL — ABNORMAL HIGH (ref 0.57–1.00)
GFR calc non Af Amer: 53 mL/min/{1.73_m2} — ABNORMAL LOW (ref >59)
GFR, EST AFRICAN AMERICAN: 62 mL/min/{1.73_m2} (ref >59)
GLUCOSE: 317 mg/dL — AB (ref 65–99)
POTASSIUM: 3.9 mmol/L (ref 3.5–5.2)
Sodium: 137 mmol/L (ref 134–144)

## 2015-12-22 NOTE — Assessment & Plan Note (Signed)
Leg swelling that initialy started in July. Pt is scheduled for a Ct Angio- abdomen and pelvis to evaluate the veins in her pelvis for possible compression- ?May thurner syndrome. She now has an ulcer on her leg, present for about a week now, with foul smelling drainage. Ulcer started as a blister and has rapidly progressed over the past week. She has been wearing the compression stockings, but that appears to be too tight, possible cutting up some blood supply, reducing drainage from her legs and worsening ulcer. She has no fever or chill, denies malaise. Patient tearful today.  Plan- Clindamycin to cover MRSA and anaerobs,  300mg  TID for 10 days.  Pt has a hx of MRSa infection. - CT Angio approved today, earliest it can be done- Thursday.  - She will not take her lasix- the day before and the day of the test. - Bmet today, Cr trending up slightly over the last three months - if Cr cont to increase consider reducing dose of lasix. - Refer to wound care. - Also has referral to [facility] in the works.

## 2015-12-22 NOTE — Assessment & Plan Note (Addendum)
Pts Cr stable but slightly elevated at 1.2,  pts Cr was 1.36 2 weeks ago , doubt relation lasix, as increasing Cr over the past 6 months, predates first prescription of lasix. Most likely due to diabetic nephropathy with significant microalb/cr 2839, She is on low dose ACEi- lisinopril- 5mg  daily, consider increasing dose after CTA abd and pelvis with contrast.  Plan- increase ACEi next visit.

## 2015-12-23 ENCOUNTER — Encounter: Payer: Self-pay | Admitting: Internal Medicine

## 2015-12-23 ENCOUNTER — Ambulatory Visit (INDEPENDENT_AMBULATORY_CARE_PROVIDER_SITE_OTHER): Payer: BLUE CROSS/BLUE SHIELD | Admitting: Internal Medicine

## 2015-12-23 ENCOUNTER — Encounter (HOSPITAL_COMMUNITY): Payer: Self-pay | Admitting: General Practice

## 2015-12-23 ENCOUNTER — Telehealth: Payer: Self-pay | Admitting: *Deleted

## 2015-12-23 ENCOUNTER — Observation Stay (HOSPITAL_COMMUNITY)
Admission: AD | Admit: 2015-12-23 | Discharge: 2015-12-24 | Disposition: A | Discharge status: Home / Self Care | Payer: BLUE CROSS/BLUE SHIELD | Source: Ambulatory Visit | Attending: Internal Medicine | Admitting: Internal Medicine

## 2015-12-23 VITALS — BP 116/92 | HR 98 | Temp 98.6°F | Ht 65.0 in | Wt 202.8 lb

## 2015-12-23 DIAGNOSIS — N189 Chronic kidney disease, unspecified: Secondary | ICD-10-CM | POA: Insufficient documentation

## 2015-12-23 DIAGNOSIS — N920 Excessive and frequent menstruation with regular cycle: Secondary | ICD-10-CM | POA: Diagnosis not present

## 2015-12-23 DIAGNOSIS — Z87891 Personal history of nicotine dependence: Secondary | ICD-10-CM | POA: Insufficient documentation

## 2015-12-23 DIAGNOSIS — D573 Sickle-cell trait: Secondary | ICD-10-CM | POA: Insufficient documentation

## 2015-12-23 DIAGNOSIS — R42 Dizziness and giddiness: Secondary | ICD-10-CM

## 2015-12-23 DIAGNOSIS — L988 Other specified disorders of the skin and subcutaneous tissue: Secondary | ICD-10-CM

## 2015-12-23 DIAGNOSIS — M7989 Other specified soft tissue disorders: Principal | ICD-10-CM

## 2015-12-23 DIAGNOSIS — I129 Hypertensive chronic kidney disease with stage 1 through stage 4 chronic kidney disease, or unspecified chronic kidney disease: Secondary | ICD-10-CM | POA: Diagnosis not present

## 2015-12-23 DIAGNOSIS — D509 Iron deficiency anemia, unspecified: Secondary | ICD-10-CM | POA: Insufficient documentation

## 2015-12-23 DIAGNOSIS — E1122 Type 2 diabetes mellitus with diabetic chronic kidney disease: Secondary | ICD-10-CM

## 2015-12-23 DIAGNOSIS — E1165 Type 2 diabetes mellitus with hyperglycemia: Secondary | ICD-10-CM

## 2015-12-23 DIAGNOSIS — E11610 Type 2 diabetes mellitus with diabetic neuropathic arthropathy: Secondary | ICD-10-CM | POA: Diagnosis present

## 2015-12-23 DIAGNOSIS — G629 Polyneuropathy, unspecified: Secondary | ICD-10-CM | POA: Insufficient documentation

## 2015-12-23 DIAGNOSIS — Z9114 Patient's other noncompliance with medication regimen: Secondary | ICD-10-CM | POA: Diagnosis not present

## 2015-12-23 DIAGNOSIS — R112 Nausea with vomiting, unspecified: Secondary | ICD-10-CM | POA: Diagnosis not present

## 2015-12-23 DIAGNOSIS — N182 Chronic kidney disease, stage 2 (mild): Secondary | ICD-10-CM | POA: Diagnosis present

## 2015-12-23 DIAGNOSIS — Z23 Encounter for immunization: Secondary | ICD-10-CM | POA: Diagnosis not present

## 2015-12-23 DIAGNOSIS — I951 Orthostatic hypotension: Secondary | ICD-10-CM

## 2015-12-23 DIAGNOSIS — Z794 Long term (current) use of insulin: Secondary | ICD-10-CM

## 2015-12-23 DIAGNOSIS — E11319 Type 2 diabetes mellitus with unspecified diabetic retinopathy without macular edema: Secondary | ICD-10-CM | POA: Diagnosis not present

## 2015-12-23 DIAGNOSIS — E785 Hyperlipidemia, unspecified: Secondary | ICD-10-CM | POA: Insufficient documentation

## 2015-12-23 DIAGNOSIS — E1121 Type 2 diabetes mellitus with diabetic nephropathy: Secondary | ICD-10-CM | POA: Diagnosis present

## 2015-12-23 DIAGNOSIS — J45909 Unspecified asthma, uncomplicated: Secondary | ICD-10-CM | POA: Diagnosis not present

## 2015-12-23 DIAGNOSIS — Z8701 Personal history of pneumonia (recurrent): Secondary | ICD-10-CM | POA: Diagnosis not present

## 2015-12-23 HISTORY — DX: Major depressive disorder, single episode, unspecified: F32.9

## 2015-12-23 HISTORY — DX: Pneumonia, unspecified organism: J18.9

## 2015-12-23 HISTORY — DX: Depression, unspecified: F32.A

## 2015-12-23 HISTORY — DX: Sickle-cell trait: D57.3

## 2015-12-23 LAB — CBC
HCT: 26.3 % — ABNORMAL LOW (ref 36.0–46.0)
Hemoglobin: 7.7 g/dL — ABNORMAL LOW (ref 12.0–15.0)
MCH: 20.4 pg — ABNORMAL LOW (ref 26.0–34.0)
MCHC: 29.3 g/dL — ABNORMAL LOW (ref 30.0–36.0)
MCV: 69.6 fL — AB (ref 78.0–100.0)
PLATELETS: 443 10*3/uL — AB (ref 150–400)
RBC: 3.78 MIL/uL — AB (ref 3.87–5.11)
RDW: 15.8 % — AB (ref 11.5–15.5)
WBC: 5.3 10*3/uL (ref 4.0–10.5)

## 2015-12-23 LAB — GLUCOSE, CAPILLARY
GLUCOSE-CAPILLARY: 191 mg/dL — AB (ref 65–99)
Glucose-Capillary: 292 mg/dL — ABNORMAL HIGH (ref 65–99)

## 2015-12-23 LAB — MRSA PCR SCREENING: MRSA BY PCR: NEGATIVE

## 2015-12-23 MED ORDER — ALBUTEROL SULFATE (2.5 MG/3ML) 0.083% IN NEBU: 3.0000 mL | INHALATION_SOLUTION | RESPIRATORY_TRACT | Status: DC | PRN

## 2015-12-23 MED ORDER — INFLUENZA VAC SPLIT QUAD 0.5 ML IM SUSY
0.5000 mL | PREFILLED_SYRINGE | INTRAMUSCULAR | Status: AC
  Administered 2015-12-24: 0.5 mL via INTRAMUSCULAR
  Filled 2015-12-23: qty 0.5

## 2015-12-23 MED ORDER — INSULIN ASPART PROT & ASPART (70-30 MIX) 100 UNIT/ML ~~LOC~~ SUSP
15.0000 [IU] | Freq: Two times a day (BID) | SUBCUTANEOUS | Status: DC
Start: 2015-12-23 — End: 2015-12-24
  Administered 2015-12-23 – 2015-12-24 (×2): 15 [IU] via SUBCUTANEOUS
  Filled 2015-12-23: qty 10

## 2015-12-23 MED ORDER — SODIUM CHLORIDE 0.9 % IV SOLN
INTRAVENOUS | Status: AC
  Administered 2015-12-23 – 2015-12-24 (×2): via INTRAVENOUS

## 2015-12-23 MED ORDER — CLINDAMYCIN PHOSPHATE 600 MG/50ML IV SOLN
600.0000 mg | Freq: Three times a day (TID) | INTRAVENOUS | Status: DC
  Administered 2015-12-24 (×2): 600 mg via INTRAVENOUS
  Filled 2015-12-23 (×2): qty 50

## 2015-12-23 MED ORDER — ENOXAPARIN SODIUM 40 MG/0.4ML ~~LOC~~ SOLN
40.0000 mg | SUBCUTANEOUS | Status: DC
  Administered 2015-12-23: 40 mg via SUBCUTANEOUS
  Filled 2015-12-23: qty 0.4

## 2015-12-23 MED ORDER — SODIUM CHLORIDE 0.9% FLUSH: 3.0000 mL | Freq: Two times a day (BID) | INTRAVENOUS | Status: DC

## 2015-12-23 NOTE — Telephone Encounter (Signed)
Walk-in,   Pt is not happy with the way her care is going, she states she does not have the money to keep coming back, today she has had 3 episodes of vomiting and has been nauseous constantly, she states she should have been admitted to hosp 2/6. She states she is out of money due to her circumstances and cannot come back tomorrow for her scheduled test. She states being a diabetic she should stay calm and not be upset but this is causing her a lot of distress. She has not checked her cbg's in 2 weeks, she is out of supplies. She states "they" do not have my best interest at heart. She states it does not cost anybody anything to admit her and do all her tests and give her medications through an IV. She states until somebody cares and puts her first she will keep coming back with no money. She states she is not working and insurance pays it all appt made for [phone]

## 2015-12-23 NOTE — Progress Notes (Signed)
Patient ID: Jane [name], female   DOB: October 28, 1976, 40 y.o.   MRN: [id number]   Subjective:   Patient ID: Jane [name] female   DOB: 1976-01-30 40 y.o.   MRN: [id number]  HPI: [name] is a 40 y.o. presented with complaints of vomiting and dizziness. Pt was seen 2 days ago to follow up on her unilateral lower extremity swelling involving her left lower extremity with a new ulcer. She was started on a course of clindamycin for 10 days. She started the medication of the same day, took it yesterday and today. She started vomiting todaysays she had 5 episodes, of non bloody emesis. She did not take her Bp meds today or take her insulin. She denies diarrhea or abdominal pain. No fever. She also describes dizziness starting today. Pt says she has no money, cannot afford any medications i will prescribe.  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 She says he has no money and cannot afford any medications i will prescribe today- including antiemetics.  Pt also says she cannot come back tomorrow for her CTA for evaluation of her lower extremity swelling, she has no money, because she has ben out of work.  Past Medical History  Diagnosis Date  . Asthma   . Hyperlipidemia   . Seasonal allergies   . Noncompliance with medications   . Peripheral neuropathy ([place])   . Diabetic retinopathy ([place])   . Iron deficiency anemia   . Menorrhagia   . Hypertension   . Pneumonia "several times"  . Type II diabetes mellitus ([facility])     uncontrolled  . Sickle cell trait ([place])   . Depression    Review of Systems: CONSTITUTIONAL- No Fever, weightloss SKIN- No Rash, colour changes or itching. HEAD- No Headache or dizziness. Mouth/throat- No Sorethroat, dentures, RESPIRATORY- No Cough or SOB. CARDIAC- No Palpitations,  or chest pain.  Objective:  Physical Exam: Filed Vitals:   12/23/15 1630  BP: 116/92  Pulse: 98  Temp: 98.6 F (37 C)  TempSrc: Oral  Height: 5\' 5"  (1.651 m)  Weight: 202 lb 12.8 oz (91.989 kg)  SpO2: 100%   GENERAL- alert, co-operative, appears as stated age, not in any distress. HEENT- Atraumatic, normocephalic, PERRL, neck supple. CARDIAC- Regular, no rubs or gallops. RESP-  no wheezes or crackles. ABDOMEN- Soft, nontender NEURO- No obvious Cr N abnormality, strenght upper and lower extremities-intact EXTREMITIES- Pitting pedal edema,with ulcer on anterior aspect on feet- left, with purulent drainage and foul smell. SKIN- Warm, dry, No rash or lesion. PSYCH- Normal mood and affect, appropriate thought content and speech.  Assessment & Plan:   The patient's case and plan of care was discussed with attending physician, Dr. Daryll Drown.  Vomiting with orthostatic hypotension, with purulent ulcer- Pt with vomiting while on oral antibiotics. Pt orthostatic with drop in systolic Bp of [id number] points. Pt clearly expressing that she cannot take care of her self at home due to social issues, also clinic closed so cannot give fluids and get start labs done.  - Will admit pt for hydration, and failure of out pt management - Also to complete evaluation for possible May- thurner syndrome, with CT angio- abd and pelvis - Wound care consult  - Admit to Obs, med surg.

## 2015-12-23 NOTE — Progress Notes (Signed)
Internal Medicine Clinic Attending  I saw and evaluated the patient.  I personally confirmed the key portions of the history and exam documented by Dr. Denton Brick and I reviewed pertinent patient test results.  The assessment, diagnosis, and plan were formulated together and I agree with the documentation in the resident's note.  Difficult case of a young woman with worsening unilateral left sided lymphedema of unclear etiology. Workup so far unrevealing of either clot, or pelvic adenopathy. May-thurner syndrome remains a possibility and I agree with imaging workup and vascular or VIR follow up. Infected wound on the left leg is new and we will treat with oral antibiotics for now.

## 2015-12-23 NOTE — Addendum Note (Signed)
Addended by: Lalla Brothers T on: 12/23/2015 11:01 AM   Modules accepted: Level of Service

## 2015-12-23 NOTE — Progress Notes (Signed)
Patient arrived on unit via wheelchair.

## 2015-12-23 NOTE — H&P (Signed)
Date: 12/23/2015               Patient Name:  Jane [name] MRN: [id number]  DOB: 1976-07-04 Age / Sex: 40 y.o., female   PCP: Juluis Mire, MD         Medical Service: Internal Medicine Teaching Service         Attending Physician: Dr. Oval Linsey, MD    First Contact: Dr. Burgess Estelle Pager: [phone]  Second Contact: Dr. Marvel Plan Pager: [phone]       After Hours (After 5p/  First Contact Pager: [phone]  weekends / holidays): Second Contact Pager: [phone]   Chief Complaint: left leg swelling   History of Present Illness:   This is a 40 yo woman with PMH of T2DM, HTn, asthma, HLD, IDA, who is admitted from the clinic for CT angio of left leg swelling, and vomiting.  Patient says that ever since October, her left leg has been swollen, and then she saw a neurologist  On Jan 12th, who asked her to elevate the legs which made her fill out FMLA paperwork at her job, and later she was prescribed compression stockings. Around 1/24, her left foot got bigger and she had to force herself to get in the left shoe. After Jan 26th, she started noticing a blister on the left foot near the shin, which she attributes to wearing an ACE bandage . She applied antibacterial bandaid but it did not go away and she noticed an odor. So, she came on Feb 7th, and a CT scan was scheduled for THursday. However pt said she had three dollars today and if she went home, she was not going to able to make it to the appointment tomorrow as her car broke down.  On Monday, She was started on clindamycin and today in the morning she experienced 5 episodes of nonbloody nonbilious emesis 5 times about 20 minutes after she took the clindamycin tablet on an empty stomach.   Patient has not experienced fevers, chills, weight loss or weight gain, appetite change, or other systemic symptoms. She denies abd pain, diarrhea, headaches, chest pain or shortness of breath.   She says for the last few days, her left foot  and leg has actually gotten a little smaller and the pain is a throbbing pain worsened by applying pressure. She is able to bear weight on her left leg. Her sensation is intact on her left leg.   Back in oct, she had ct abd pelvis with contrast which was unremarkable. In the clinci earlier in the week, there was a concern of worsening unilateral left leg edema- for May-Thurner syndrome, so wanted a CT angio and if needed IR consult.    FH: stroke in mother, CAD in father. NO FH of blood clots  SH: smoked 0.25 ppd for atleast 5 years prior, quit back in September. No alcohol use or other drugs Lives with a friend, and has a 29 year old son and daughter and 31 year old grand son   Meds: Current Facility-Administered Medications  Medication Dose Route Frequency Provider Last Rate Last Dose  . 0.9 %  sodium chloride infusion   Intravenous Continuous Francesca Oman, DO      . albuterol (PROVENTIL) (2.5 MG/3ML) 0.083% nebulizer solution 3 mL  3 mL Inhalation Q4H PRN Francesca Oman, DO      . enoxaparin (LOVENOX) injection 40 mg  40 mg Subcutaneous Q24H Francesca Oman, DO      .  insulin aspart protamine- aspart (NOVOLOG MIX 70/30) injection 15 Units  15 Units Subcutaneous BID WC Francesca Oman, DO      . sodium chloride flush (NS) 0.9 % injection 3 mL  3 mL Intravenous Q12H Francesca Oman, DO        Allergies: Allergies as of 12/23/2015  . (No Known Allergies)   Past Medical History  Diagnosis Date  . Asthma   . Hyperlipidemia   . Seasonal allergies   . Noncompliance with medications   . Peripheral neuropathy ([place])   . Diabetic retinopathy ([place])   . Iron deficiency anemia   . Diabetes mellitus     uncontrolled  . Menorrhagia   . Hypertension    Past Surgical History  Procedure Laterality Date  . Tubal ligation    . Cesarean section     Family History  Problem Relation Age of Onset  . Stroke Mother   . Diabetes Mother   . Hypertension Mother   . Aneurysm Mother   . Diabetes Father     . Hypertension Father   . Diabetes Maternal Grandmother   . Diabetes Maternal Grandfather   . Cancer Paternal Grandfather     Prostate  . Diabetes Sister   . Hypertension Sister    Social History   Social History  . Marital Status: Single    Spouse Name: N/A  . Number of Children: 2  . Years of Education: 13   Occupational History  .   [employer]    Social History Main Topics  . Smoking status: Former Smoker -- 0.30 packs/day for 1 years    Types: Cigarettes    Quit date: 05/29/2015  . Smokeless tobacco: Never Used  . Alcohol Use: 0.0 oz/week    0 Standard drinks or equivalent per week     Comment: once every 2 weeks  . Drug Use: No  . Sexual Activity: Yes    Birth Control/ Protection: Surgical   Other Topics Concern  . Not on file   Social History Narrative   Patient does not drink caffeine.   Patient is right handed.     Review of Systems: Pertinent items noted in HPI and remainder of comprehensive ROS otherwise negative.  Physical Exam: Blood pressure 182/100, pulse 97, temperature 98.1 F (36.7 C), temperature source Oral, resp. rate 22, height 5\' 5"  (1.651 m), weight 202 lb (91.627 kg), last menstrual period 12/17/2015, SpO2 100 %.  General: Vital signs reviewed. Patient in no acute distress Cardiovascular: regular rate, rhythm, no murmur appreciated  Pulmonary/Chest: Clear to auscultation bilaterally, no wheezes, rales, or rhonchi. Abdominal: Soft, non-tender, non-distended, BS + Extremities:  Left leg swollen and tender to palpation. Left foot swollen with pulses palpable but faint. Has 1 cm ulcer near shin, not draining and no erythema. Right leg pulses intact and not swollen.      Lab results: Results for orders placed or performed during the hospital encounter of 12/23/15 (from the past 24 hour(s))  Glucose, capillary     Status: Abnormal   Collection Time: 12/23/15  5:57 PM  Result Value Ref Range   Glucose-Capillary 191 (H) 65 - 99 mg/dL      Imaging results:  No results found.  Other results:   Assessment & Plan by Problem: Active Problems:   Orthostatic hypotension  Unilateral left leg swelling now with a 1 cm ulcer on foot: Workup so far has been negative for blood clot or pelvic adenopathy. There is a remote  possibility of may thurner syndrome, and for that reason, patient is scheduled to have CT angio with and without contrast tomorrow. She is admitted because she said in clinic she will cancel the test if she is not admitted. The wound on her foot is new and is being treated with clinda for few days which patient has been compliant with. Pt has history of MRSA infection   -NPO after midnight -CT angio w and wo contrast with VIR consult if needed  -wound care consult -CBC and BMET   T2DM: Patient with rising creatinine per clinic note over last 6 months, preceded by lasix use.  Probably rising creatinine due to diabetic nephropathy as microalb/cr of 2839. She is on lisinopril 5 mg daily. Cr of 1.26.  A1c of 10.8. She is on novolog 20 units bid wc I believe low dose metformin can still be added if her cr is 1.26.   -SSI -novolog 15 units bid  -holding lisinopril   5 episodes of vomiting: likely due to preceding oral clindamycin use taken on empty s. Pt has not eaten anything unusual and no sick contacts. No sickness prior to today. She feels completely fine when I saw her in evening and was wanting to eat.    Asthma -albuterol PRN  HTN: on lasix at home  CKD 2       F NS 125 cc/hr E N HH  DVT ppx: lovenox  Code full  Dispo: Disposition is deferred at this time, awaiting improvement of current medical problems. Anticipated discharge in approximately 1 day(s).   The patient does have a current PCP (Juluis Mire, MD) and does need an [facility] hospital follow-up appointment after discharge.  The patient does not have transportation limitations that hinder transportation to clinic  appointments.  Signed: Burgess Estelle, MD 12/23/2015, 7:29 PM

## 2015-12-23 NOTE — Telephone Encounter (Signed)
Noted.  Dr. Denton Brick will see her his afternoon.

## 2015-12-24 ENCOUNTER — Observation Stay (HOSPITAL_COMMUNITY): Payer: BLUE CROSS/BLUE SHIELD

## 2015-12-24 ENCOUNTER — Ambulatory Visit (HOSPITAL_COMMUNITY): Payer: BLUE CROSS/BLUE SHIELD

## 2015-12-24 DIAGNOSIS — M7989 Other specified soft tissue disorders: Secondary | ICD-10-CM | POA: Insufficient documentation

## 2015-12-24 DIAGNOSIS — R600 Localized edema: Secondary | ICD-10-CM

## 2015-12-24 LAB — BASIC METABOLIC PANEL
Anion gap: 10 (ref 5–15)
BUN: 18 mg/dL (ref 6–20)
CHLORIDE: 101 mmol/L (ref 101–111)
CO2: 27 mmol/L (ref 22–32)
CREATININE: 1.26 mg/dL — AB (ref 0.44–1.00)
Calcium: 8.1 mg/dL — ABNORMAL LOW (ref 8.9–10.3)
GFR calc Af Amer: >60 mL/min (ref >60)
GFR calc non Af Amer: 53 mL/min — ABNORMAL LOW (ref >60)
GLUCOSE: 125 mg/dL — AB (ref 65–99)
Potassium: 3.6 mmol/L (ref 3.5–5.1)
SODIUM: 138 mmol/L (ref 135–145)

## 2015-12-24 LAB — GLUCOSE, CAPILLARY
GLUCOSE-CAPILLARY: 116 mg/dL — AB (ref 65–99)
GLUCOSE-CAPILLARY: 116 mg/dL — AB (ref 65–99)
GLUCOSE-CAPILLARY: 150 mg/dL — AB (ref 65–99)

## 2015-12-24 MED ORDER — COLLAGENASE 250 UNIT/GM EX OINT: TOPICAL_OINTMENT | Freq: Every day | CUTANEOUS | Status: DC

## 2015-12-24 MED ORDER — COLLAGENASE 250 UNIT/GM EX OINT
TOPICAL_OINTMENT | Freq: Every day | CUTANEOUS | Status: DC
  Administered 2015-12-24: 11:00:00 via TOPICAL
  Filled 2015-12-24: qty 30

## 2015-12-24 MED ORDER — ONDANSETRON HCL 4 MG PO TABS: 4.0000 mg | ORAL_TABLET | Freq: Three times a day (TID) | ORAL | Status: DC | PRN

## 2015-12-24 MED ORDER — IOHEXOL 350 MG/ML SOLN
100.0000 mL | Freq: Once | INTRAVENOUS | Status: AC | PRN
  Administered 2015-12-24: 100 mL via INTRAVENOUS

## 2015-12-24 MED ORDER — ACETAMINOPHEN 325 MG PO TABS
650.0000 mg | ORAL_TABLET | Freq: Four times a day (QID) | ORAL | Status: DC | PRN
  Administered 2015-12-24: 650 mg via ORAL
  Filled 2015-12-24: qty 2

## 2015-12-24 MED ORDER — LISINOPRIL 5 MG PO TABS
5.0000 mg | ORAL_TABLET | Freq: Every day | ORAL | Status: DC
  Administered 2015-12-24: 5 mg via ORAL
  Filled 2015-12-24: qty 1

## 2015-12-24 MED ORDER — TRAMADOL HCL 50 MG PO TABS: 50.0000 mg | ORAL_TABLET | Freq: Four times a day (QID) | ORAL | Status: DC | PRN

## 2015-12-24 NOTE — Discharge Instructions (Signed)
It was a pleasure taking care of you today We have done the CT scan and we did not see any cause of your lower left swelling. The next step would be to refer to a vascular vein specialist, which we have done- Please be sure to go to them.  I called our clinic and they told me they have been trying to call you several times  But they have not been able to reach you. Please be sure to pick up their phone call to schedule appointment with Vascular vein specialist    Please resume your lisinopril

## 2015-12-24 NOTE — Clinical Social Work Note (Cosign Needed)
Patient requested a bus pass for transportation after being discharged. CSW provided Ms. Sweis's nurse with a bus pass. Patient discharged today. There were no further questions or concerns.

## 2015-12-24 NOTE — Progress Notes (Signed)
12/24/2015 9:06 AM  Patient returned from CT and inquired about eating. Informed the attending MD. No new orders written. Remain NPO at this time. Will continue to assess and monitor the patient.  [employer], RN-BC, [employer] [facility] [address] Phone [phone]

## 2015-12-24 NOTE — Consult Note (Addendum)
WOC wound consult note Reason for Consult: Consult requested for left foot wound.  Pt states this began as a blister and evolved into a wound with mod amt foul drainage and odor.  She has been on systemic antibiotics and her CT displayed adequate iliac perfusion. Wound type: Full thickness to left anterior foot Measurement: 1.5X1.5cm Wound bed:  90% tightly adhered yellow slough, 10% red Drainage (amount, consistency, odor)  Small amt yellow drainage, no odor Periwound: Intact skin surrounding, slight amt edema to left foot Dressing procedure/placement/frequency: Santyl ointment to chemically debride nonviable tissue.  Discussed plan of care with patient and she verbalized understanding. Please re-consult if further assistance is needed.  Thank-you,  Julien Girt MSN, [place], [place], [place], [place]

## 2015-12-24 NOTE — Progress Notes (Signed)
Chaplain met with patient for spiritual care rounding.  The patient report several life challenges she is experiencing at this time, inclusive of the reason for her hospitalization. The voices her many concerns and her desire to move forward with life. Chaplain offered a prayer of hearing,  finding stable housing,  financial support, and maintaining a strong family support system. Chaplain will continue to follow for support as needed. Chaplain Yaakov Guthrie [phone]

## 2015-12-24 NOTE — Progress Notes (Signed)
Discharge instructions given. Pt verbalized understanding and all questions were answered.  

## 2015-12-24 NOTE — Progress Notes (Signed)
Subjective:  Patient doing well and had come back from CT. No other complaints. No n/v/. No fevers. Wound care was seeing her when we came.   We explained the need for outpatient Vascular vein specialist referral. I talked with our clinic and they have been trying to schedule appt several times but pt non-reachable on phone. I conveyed this to pt and urged her to make appt with them again.   Objective: Vital signs in last 24 hours: Filed Vitals:   12/23/15 1814 12/23/15 2216 12/24/15 0507 12/24/15 0935  BP:  157/80 159/87 174/89  Pulse:  101 92 94  Temp:  97.6 F (36.4 C) 98 F (36.7 C) 98 F (36.7 C)  TempSrc:  Oral Oral Oral  Resp:  17 20 18   Height: 5\' 5"  (1.651 m)     Weight: 202 lb (91.627 kg)     SpO2:   100% 100%   Weight change:   Intake/Output Summary (Last 24 hours) at 12/24/15 1048 Last data filed at 12/24/15 0927  Gross per 24 hour  Intake   1475 ml  Output    650 ml  Net    825 ml    General: Vital signs reviewed. Patient in no acute distress Cardiovascular: regular rate, rhythm, no murmur appreciated  Pulmonary/Chest: Clear to auscultation bilaterally, no wheezes, rales, or rhonchi. Abdominal: Soft, non-tender, non-distended, BS + Extremities: Left leg swollen and tender to palpation. Has 1 cm ulcer near shin, not draining and no erythema.   Lab Results: Results for orders placed or performed during the hospital encounter of 12/23/15 (from the past 24 hour(s))  Glucose, capillary     Status: Abnormal   Collection Time: 12/23/15  5:57 PM  Result Value Ref Range   Glucose-Capillary 191 (H) 65 - 99 mg/dL  MRSA PCR Screening     Status: None   Collection Time: 12/23/15  6:22 PM  Result Value Ref Range   MRSA by PCR NEGATIVE NEGATIVE  CBC     Status: Abnormal   Collection Time: 12/23/15  8:43 PM  Result Value Ref Range   WBC 5.3 4.0 - 10.5 K/uL   RBC 3.78 (L) 3.87 - 5.11 MIL/uL   Hemoglobin 7.7 (L) 12.0 - 15.0 g/dL   HCT 26.3 (L) 36.0 - 46.0 %   MCV 69.6 (L) 78.0 - 100.0 fL   MCH 20.4 (L) 26.0 - 34.0 pg   MCHC 29.3 (L) 30.0 - 36.0 g/dL   RDW 15.8 (H) 11.5 - 15.5 %   Platelets 443 (H) 150 - 400 K/uL  Glucose, capillary     Status: Abnormal   Collection Time: 12/23/15 10:13 PM  Result Value Ref Range   Glucose-Capillary 292 (H) 65 - 99 mg/dL   Comment 1 Notify RN    Comment 2 Document in Chart   Glucose, capillary     Status: Abnormal   Collection Time: 12/24/15  7:23 AM  Result Value Ref Range   Glucose-Capillary 116 (H) 65 - 99 mg/dL  Basic metabolic panel     Status: Abnormal   Collection Time: 12/24/15  8:02 AM  Result Value Ref Range   Sodium 138 135 - 145 mmol/L   Potassium 3.6 3.5 - 5.1 mmol/L   Chloride 101 101 - 111 mmol/L   CO2 27 22 - 32 mmol/L   Glucose, Bld 125 (H) 65 - 99 mg/dL   BUN 18 6 - 20 mg/dL   Creatinine, Ser 1.26 (H) 0.44 - 1.00 mg/dL  Calcium 8.1 (L) 8.9 - 10.3 mg/dL   GFR calc non Af Amer 53 (L) >60 mL/min   GFR calc Af Amer >60 >60 mL/min   Anion gap 10 5 - 15  Glucose, capillary     Status: Abnormal   Collection Time: 12/24/15 10:26 AM  Result Value Ref Range   Glucose-Capillary 116 (H) 65 - 99 mg/dL  Glucose, capillary     Status: Abnormal   Collection Time: 12/24/15 11:44 AM  Result Value Ref Range   Glucose-Capillary 150 (H) 65 - 99 mg/dL    Micro Results: Recent Results (from the past 240 hour(s))  MRSA PCR Screening     Status: None   Collection Time: 12/23/15  6:22 PM  Result Value Ref Range Status   MRSA by PCR NEGATIVE NEGATIVE Final    Comment:        The GeneXpert MRSA Assay (FDA approved for NASAL specimens only), is one component of a comprehensive MRSA colonization surveillance program. It is not intended to diagnose MRSA infection nor to guide or monitor treatment for MRSA infections.    Studies/Results: Ct Angio Abd/pel W/ And/or W/o  12/24/2015  CLINICAL DATA:  Pt is having LEFT leg swelling, and has developed a wound from this; pt was admitted for sudden  onset of severe n/v; this has resolved ** CT Venogram was ordered to r/o compression of pelvic veins, May-Turner syndrome with EXAM: CT ANGIOGRAPHY ABDOMEN AND PELVIS TECHNIQUE: Multidetector CT imaging of the abdomen and pelvis was performed using the standard protocol during bolus administration of intravenous contrast. Multiplanar reconstructed images including MIPs were obtained and reviewed to evaluate the vascular anatomy. CONTRAST:  147mL OMNIPAQUE IOHEXOL 350 MG/ML SOLN COMPARISON:  COMPARISON 09/10/2015 FINDINGS: ARTERIAL FINDINGS: Exam was not optimized for arterial evaluation. Abdominal aorta normal in caliber and contour without significant atheromatous plaque. Celiac and SMA patent. Iliac arterial system grossly unremarkable. VENOUS FINDINGS: Normal enhancement the iliac venous systems. No significant enlarged collateral venous channels are noted. IVC is patent. Bilateral renal veins patent. Superior mesenteric vein, splenic vein, and portal vein are patent. Patent hepatic veins. Review of the MIP images confirms the above findings. Nonvascular findings: No evident mass or adenopathy. Visualized lung bases clear. Unremarkable liver, spleen, adrenal glands, kidneys, pancreas. Stomach, small bowel and colon are nondilated. No ascites. No free air. Urinary bladder physiologically distended. Uterus and adnexal regions grossly unremarkable. Degenerative disc disease L5-S1. IMPRESSION: 1. No evidence of iliac venous or caval occlusion, thrombosis, or obstruction. Electronically Signed   By: Lucrezia Europe M.D.   On: 12/24/2015 09:23   Medications: I have reviewed the patient's current medications. Scheduled Meds: . clindamycin (CLEOCIN) IV  600 mg Intravenous 3 times per day  . collagenase   Topical Daily  . enoxaparin (LOVENOX) injection  40 mg Subcutaneous Q24H  . insulin aspart protamine- aspart  15 Units Subcutaneous BID WC  . sodium chloride flush  3 mL Intravenous Q12H   Continuous Infusions:    PRN Meds:.acetaminophen, albuterol, ondansetron, traMADol Assessment/Plan: Active Problems:   Chronic kidney disease (CKD), stage II (mild)   Edema of left lower extremity with ulcer   Diabetic nephropathy ([facility])   Orthostatic hypotension  Unilateral left leg swelling now with a 1 cm ulcer on foot: Workup so far has been negative for blood clot or pelvic adenopathy. CT Angio this morning ruled out iliac vein compression/ May Thurner syndrome. At this point, we do not know the cause of her left foot and leg swelling.  We explained the need for outpatient Vascular vein specialist referral. I talked with our clinic and they have been trying to schedule appt several times but pt non-reachable on phone. I conveyed this to pt and urged her to make appt with them again.  We also consulted wound care and they provided her recommendations including collagenase.   -continue clindamycin, and discharge home. -follow up in clinic and with V V S    T2DM:  Patient's blood sugars remained stable on Novolog 15 units BID  -resumed lisinopril    5 episodes of vomiting: resolved, likely due to preceding oral clindamycin use taken on empty stomach. Advised to take clinda with food and given zofran prn for home    Dispo: Disposition is deferred at this time, awaiting improvement of current medical problems.  Anticipated discharge in approximately 0 day(s).   The patient does have a current PCP (Juluis Mire, MD) and does need an [facility] hospital follow-up appointment after discharge.  The patient does not have transportation limitations that hinder transportation to clinic appointments.  .Services Needed at time of discharge: Y = Yes, Blank = No PT:   OT:   RN:   Equipment:   Other:       Burgess Estelle, MD 12/24/2015, 10:48 AM

## 2015-12-24 NOTE — Discharge Summary (Signed)
Name: Jane [name] MRN: [id number] DOB: 03/13/1976 40 y.o. PCP: Jane [name]  Date of Admission: 12/23/2015  5:49 PM Date of Discharge: 12/24/2015 Attending Physician: Oval Linsey, [name]  Discharge Diagnosis: 1. Unilateral left leg edema  Active Problems:   Chronic kidney disease (CKD), stage II (mild)   Edema of left lower extremity with ulcer   Diabetic nephropathy (HCC)   Orthostatic hypotension  Discharge Medications:   Medication List    STOP taking these medications        furosemide 40 MG tablet  Commonly known as:  LASIX      TAKE these medications        albuterol 108 (90 Base) MCG/ACT inhaler  Commonly known as:  PROVENTIL HFA;VENTOLIN HFA  Inhale 2 puffs into the lungs every 4 (four) hours as needed for wheezing or shortness of breath (or coughing).     clindamycin 300 MG capsule  Commonly known as:  CLEOCIN  Take 1 capsule (300 mg total) by mouth 3 (three) times daily.     collagenase ointment  Commonly known as:  SANTYL  Apply topically daily.     insulin aspart protamine- aspart (70-30) 100 UNIT/ML injection  Commonly known as:  NOVOLOG MIX 70/30  Inject 0.2 mLs (20 Units total) into the skin 2 (two) times daily with a meal.     lisinopril 5 MG tablet  Commonly known as:  PRINIVIL,ZESTRIL  Take 1 tablet (5 mg total) by mouth daily.     ondansetron 4 MG tablet  Commonly known as:  ZOFRAN  Take 1 tablet (4 mg total) by mouth every 8 (eight) hours as needed for nausea or vomiting.        Disposition and follow-up:   JaneDaisee L [name] was discharged from [facility] in Good condition.  At the hospital follow up visit please address:  1.  Left unilateral leg swelling- CT angio ruled out May thurner syndrome. Pt needs [facility] referral. I spoke with Jane [name] and Jane [name] and they are working on getting her an appt. They will call pt with the appt time and day. I told the pt to be proactive about this and call the clinic  if she does not hear from them as the [facility] clinic had tried to call the pt multiple times and pt did not answer the phone   Left leg ulcer: please re-assess her ulcer and see if she is following wound care directions. Has she finished clinda course?  HTN: We resumed lisinopril and held lasix as she received contrast dye and she has CKD. Please re-assess BP and resume as appropriate. She is on lisinopril 5 mg but may need 10 mg.    2.  Labs / imaging needed at time of follow-up: BMET (received contrast dye in setting of CKD)  3.  Pending labs/ test needing follow-up:   Follow-up Appointments:     Follow-up Information    Follow up with Jane [name] On 01/01/2016.   Specialty:  Internal Medicine   Why:  1:15 PM for follow up   Contact information:   [place] [address] [phone]       Discharge Instructions: Discharge Instructions    Diet - low sodium heart healthy    Complete by:  As directed      Discharge instructions    Complete by:  As directed   It was a pleasure taking care of you today We have done  the CT scan and we did not see any cause of your lower left swelling. The next step would be to refer to a vascular vein specialist, which we have done- and I will talk with the clinic and they will call you. Please be sure to go to them.  Please resume your lisinopril     Increase activity slowly    Complete by:  As directed            Consultations:    Procedures Performed:  Dg Chest 2 View  11/24/2015  CLINICAL DATA:  75-year-old female with chest pain cough and shortness of breath EXAM: CHEST  2 VIEW COMPARISON:  Radiograph dated 11/09/2015 FINDINGS: The heart size and mediastinal contours are within normal limits. Both lungs are clear. The visualized skeletal structures are unremarkable. IMPRESSION: No active cardiopulmonary disease. Electronically Signed   By: Jane [name] M.D.   On: 11/24/2015 21:18   Ct Angio Abd/pel W/ And/or  W/o  12/24/2015  CLINICAL DATA:  Pt is having LEFT leg swelling, and has developed a wound from this; pt was admitted for sudden onset of severe n/v; this has resolved ** CT Venogram was ordered to r/o compression of pelvic veins, May-Turner syndrome with EXAM: CT ANGIOGRAPHY ABDOMEN AND PELVIS TECHNIQUE: Multidetector CT imaging of the abdomen and pelvis was performed using the standard protocol during bolus administration of intravenous contrast. Multiplanar reconstructed images including MIPs were obtained and reviewed to evaluate the vascular anatomy. CONTRAST:  183mL OMNIPAQUE IOHEXOL 350 MG/ML SOLN COMPARISON:  COMPARISON 09/10/2015 FINDINGS: ARTERIAL FINDINGS: Exam was not optimized for arterial evaluation. Abdominal aorta normal in caliber and contour without significant atheromatous plaque. Celiac and SMA patent. Iliac arterial system grossly unremarkable. VENOUS FINDINGS: Normal enhancement the iliac venous systems. No significant enlarged collateral venous channels are noted. IVC is patent. Bilateral renal veins patent. Superior mesenteric vein, splenic vein, and portal vein are patent. Patent hepatic veins. Review of the MIP images confirms the above findings. Nonvascular findings: No evident mass or adenopathy. Visualized lung bases clear. Unremarkable liver, spleen, adrenal glands, kidneys, pancreas. Stomach, small bowel and colon are nondilated. No ascites. No free air. Urinary bladder physiologically distended. Uterus and adnexal regions grossly unremarkable. Degenerative disc disease L5-S1. IMPRESSION: 1. No evidence of iliac venous or caval occlusion, thrombosis, or obstruction. Electronically Signed   By: Jane [name] M.D.   On: 12/24/2015 09:23    2D Echo:   Cardiac Cath:    Admission HPI:  This is a 40 yo woman with PMH of T2DM, HTn, asthma, HLD, IDA, who is admitted from the clinic for CT angio of left leg swelling, and vomiting.  Patient says that ever since October, her left leg has  been swollen, and then she saw a neurologist On Jan 12th, who asked her to elevate the legs which made her fill out FMLA paperwork at her job, and later she was prescribed compression stockings. Around 1/24, her left foot got bigger and she had to force herself to get in the left shoe. After Jan 26th, she started noticing a blister on the left foot near the shin, which she attributes to wearing an ACE bandage . She applied antibacterial bandaid but it did not go away and she noticed an odor. So, she came on Feb 7th, and a CT scan was scheduled for THursday. However pt said she had three dollars today and if she went home, she was not going to able to make it to the appointment tomorrow  as her car broke down.  On Monday, She was started on clindamycin and today in the morning she experienced 5 episodes of nonbloody nonbilious emesis 5 times about 20 minutes after she took the clindamycin tablet on an empty stomach.   Patient has not experienced fevers, chills, weight loss or weight gain, appetite change, or other systemic symptoms. She denies abd pain, diarrhea, headaches, chest pain or shortness of breath.   She says for the last few days, her left foot and leg has actually gotten a little smaller and the pain is a throbbing pain worsened by applying pressure. She is able to bear weight on her left leg. Her sensation is intact on her left leg.   Back in oct, she had ct abd pelvis with contrast which was unremarkable. In the clinci earlier in the week, there was a concern of worsening unilateral left leg edema- for May-Thurner syndrome, so wanted a CT angio and if needed IR consult.    FH: stroke in mother, CAD in father. NO FH of blood clots  SH: smoked 0.25 ppd for atleast 5 years prior, quit back in September. No alcohol use or other drugs Lives with a friend, and has a 13 year old son and daughter and 42 year old grand son   Hospital Course by problem list: Active Problems:   Chronic kidney  disease (CKD), stage II (mild)   Edema of left lower extremity with ulcer   Diabetic nephropathy (HCC)   Orthostatic hypotension    Unilateral left leg swelling now with a 1 cm ulcer on foot: Workup so far has been negative for blood clot or pelvic adenopathy. CT Angio this morning ruled out iliac vein compression/ May Thurner syndrome. At this point, we do not know the cause of her left foot and leg swelling. We explained the need for outpatient Vascular vein specialist referral. I talked with our clinic and they have been trying to schedule appt several times but pt non-reachable on phone. I conveyed this to pt and urged her to make appt with them again. We also consulted wound care and they provided her recommendations including collagenase. Pt will continue 10 day clindamycin course, and discharge home. She will follow up in clinic and with V [facility]    T2DM: Patient's blood sugars remained stable on Novolog 15 units BID. We resumed lisinopril   HTN: we resumed lisinopril. Held lasix as she received contrast dye   5 episodes of vomiting: resolved. She received IV fluids overnight. Likely due to preceding oral clindamycin use taken on empty stomach. Advised to take clinda with food and given zofran prn for home.    Discharge Vitals:   BP 174/89 mmHg  Pulse 94  Temp(Src) 98 F (36.7 C) (Oral)  Resp 18  Ht 5\' 5"  (1.651 m)  Wt 202 lb (91.627 kg)  BMI 33.61 kg/m2  SpO2 100%  LMP 12/17/2015  Discharge Labs:  Results for orders placed or performed during the hospital encounter of 12/23/15 (from the past 24 hour(s))  Glucose, capillary     Status: Abnormal   Collection Time: 12/23/15  5:57 PM  Result Value Ref Range   Glucose-Capillary 191 (H) 65 - 99 mg/dL  MRSA PCR Screening     Status: None   Collection Time: 12/23/15  6:22 PM  Result Value Ref Range   MRSA by PCR NEGATIVE NEGATIVE  CBC     Status: Abnormal   Collection Time: 12/23/15  8:43 PM  Result Value  Ref Range   WBC 5.3  4.0 - 10.5 K/uL   RBC 3.78 (L) 3.87 - 5.11 MIL/uL   Hemoglobin 7.7 (L) 12.0 - 15.0 g/dL   HCT 26.3 (L) 36.0 - 46.0 %   MCV 69.6 (L) 78.0 - 100.0 fL   MCH 20.4 (L) 26.0 - 34.0 pg   MCHC 29.3 (L) 30.0 - 36.0 g/dL   RDW 15.8 (H) 11.5 - 15.5 %   Platelets 443 (H) 150 - 400 K/uL  Glucose, capillary     Status: Abnormal   Collection Time: 12/23/15 10:13 PM  Result Value Ref Range   Glucose-Capillary 292 (H) 65 - 99 mg/dL   Comment 1 Notify RN    Comment 2 Document in Chart   Glucose, capillary     Status: Abnormal   Collection Time: 12/24/15  7:23 AM  Result Value Ref Range   Glucose-Capillary 116 (H) 65 - 99 mg/dL  Basic metabolic panel     Status: Abnormal   Collection Time: 12/24/15  8:02 AM  Result Value Ref Range   Sodium 138 135 - 145 mmol/L   Potassium 3.6 3.5 - 5.1 mmol/L   Chloride 101 101 - 111 mmol/L   CO2 27 22 - 32 mmol/L   Glucose, Bld 125 (H) 65 - 99 mg/dL   BUN 18 6 - 20 mg/dL   Creatinine, Ser 1.26 (H) 0.44 - 1.00 mg/dL   Calcium 8.1 (L) 8.9 - 10.3 mg/dL   GFR calc non Af Amer 53 (L) >60 mL/min   GFR calc Af Amer >60 >60 mL/min   Anion gap 10 5 - 15  Glucose, capillary     Status: Abnormal   Collection Time: 12/24/15 10:26 AM  Result Value Ref Range   Glucose-Capillary 116 (H) 65 - 99 mg/dL    Signed: Burgess Estelle, [name] 12/24/2015, 11:12 AM    Services Ordered on Discharge:  Equipment Ordered on Discharge:

## 2015-12-25 ENCOUNTER — Encounter: Payer: Self-pay | Admitting: Vascular Surgery

## 2015-12-25 ENCOUNTER — Ambulatory Visit (INDEPENDENT_AMBULATORY_CARE_PROVIDER_SITE_OTHER): Payer: BLUE CROSS/BLUE SHIELD | Admitting: Vascular Surgery

## 2015-12-25 VITALS — BP 155/96 | HR 111 | Ht 65.0 in | Wt 203.7 lb

## 2015-12-25 DIAGNOSIS — I83003 Varicose veins of unspecified lower extremity with ulcer of ankle: Secondary | ICD-10-CM | POA: Insufficient documentation

## 2015-12-25 DIAGNOSIS — I83023 Varicose veins of left lower extremity with ulcer of ankle: Secondary | ICD-10-CM

## 2015-12-25 DIAGNOSIS — L97329 Non-pressure chronic ulcer of left ankle with unspecified severity: Principal | ICD-10-CM

## 2015-12-25 DIAGNOSIS — L97309 Non-pressure chronic ulcer of unspecified ankle with unspecified severity: Secondary | ICD-10-CM

## 2015-12-25 NOTE — Progress Notes (Signed)
Referred by:  Juluis Mire, MD [place], [place] [address]  Reason for referral: left leg ulcer   History of Present Illness  Jane [name] is a 40 y.o. (02/25/1976) female who presents with chief complaint: left leg swelling.  Patient notes, onset of swelling in July 2016, associated with no obvious trigger.  The patient's symptoms include: swelling and development of anterior ankle ulcer one week ago.  At that time reportedly, she had foul smelling drainage from the ulcer.  The patient has been started on Santyl for wound care on the ulcer.  The patient has had no history of DVT, known history of pregnancy, unknown history of varicose vein, no prior history of venous stasis ulcers, no history of  Lymphedema and no history of skin changes in lower legs.  There is no family history of venous disorders.  The patient has intermittently used compression stockings in the past.  Today, the patient complains of chills.   Past Medical History  Diagnosis Date  . Asthma   . Hyperlipidemia   . Seasonal allergies   . Noncompliance with medications   . Peripheral neuropathy ([place])   . Diabetic retinopathy ([place])   . Iron deficiency anemia   . Menorrhagia   . Hypertension   . Pneumonia "several times"  . Type II diabetes mellitus (HCC)     uncontrolled  . Sickle cell trait ([place])   . Depression     Past Surgical History  Procedure Laterality Date  . Cesarean section  1997  . Tubal ligation  1997    Social History   Social History  . Marital Status: Single    Spouse Name: N/A  . Number of Children: 2  . Years of Education: 13   Occupational History  .   [employer]    Social History Main Topics  . Smoking status: Former Smoker -- 0.25 packs/day for 1 years    Types: Cigarettes    Quit date: 08/29/2015  . Smokeless tobacco: Never Used  . Alcohol Use: 0.0 oz/week    0 Standard drinks or equivalent per week     Comment: 12/23/2015 "last alcohol was ~ Christmas  2016; that was very little alcohol"  . Drug Use: Yes    Special: Marijuana     Comment: 12/23/2015 "used to smoke marijuana; quit 04/2013"  . Sexual Activity: Yes    Birth Control/ Protection: Surgical   Other Topics Concern  . Not on file   Social History Narrative   Patient does not drink caffeine.   Patient is right handed.     Family History  Problem Relation Age of Onset  . Stroke Mother   . Diabetes Mother   . Hypertension Mother   . Aneurysm Mother   . Diabetes Father   . Hypertension Father   . Diabetes Maternal Grandmother   . Diabetes Maternal Grandfather   . Cancer Paternal Grandfather     Prostate  . Diabetes Sister   . Hypertension Sister     Current Outpatient Prescriptions  Medication Sig Dispense Refill  . albuterol (PROVENTIL HFA;VENTOLIN HFA) 108 (90 Base) MCG/ACT inhaler Inhale 2 puffs into the lungs every 4 (four) hours as needed for wheezing or shortness of breath (or coughing). 1 Inhaler 2  . clindamycin (CLEOCIN) 300 MG capsule Take 1 capsule (300 mg total) by mouth 3 (three) times daily. 30 capsule 0  . collagenase (SANTYL) ointment Apply topically daily. 15 g 0  . insulin  aspart protamine- aspart (NOVOLOG MIX 70/30) (70-30) 100 UNIT/ML injection Inject 0.2 mLs (20 Units total) into the skin 2 (two) times daily with a meal. 10 mL 11  . lisinopril (PRINIVIL,ZESTRIL) 5 MG tablet Take 1 tablet (5 mg total) by mouth daily. (Patient taking differently: Take 10 mg by mouth daily. ) 30 tablet 5  . ondansetron (ZOFRAN) 4 MG tablet Take 1 tablet (4 mg total) by mouth every 8 (eight) hours as needed for nausea or vomiting. 20 tablet 0   No current facility-administered medications for this visit.    No Known Allergies   REVIEW OF SYSTEMS:  (Positives checked otherwise negative)  CARDIOVASCULAR:   [ ]  chest pain,  [ ]  chest pressure,  [ ]  palpitations,  [ ]  shortness of breath when laying flat,  [ ]  shortness of breath with exertion,   [ ]  pain in feet  when walking,  [ ]  pain in feet when laying flat, [ ]  history of blood clot in veins (DVT),  [ ]  history of phlebitis,  [x]  swelling in legs,  [x]  varicose veins  PULMONARY:   [ ]  productive cough,  [ ]  asthma,  [ ]  wheezing  NEUROLOGIC:   [ ]  weakness in arms or legs,  [ ]  numbness in arms or legs,  [ ]  difficulty speaking or slurred speech,  [ ]  temporary loss of vision in one eye,  [ ]  dizziness  HEMATOLOGIC:   [ ]  bleeding problems,  [ ]  problems with blood clotting too easily  MUSCULOSKEL:   [ ]  joint pain, [ ]  joint swelling  GASTROINTEST:   [ ]  vomiting blood,  [ ]  blood in stool     GENITOURINARY:   [ ]  burning with urination,  [ ]  blood in urine  PSYCHIATRIC:   [ ]  history of major depression  INTEGUMENTARY:   [ ]  rashes,  [x]  ulcers  CONSTITUTIONAL:   [ ]  fever,  [x]  chills   Physical Examination  Filed Vitals:   12/25/15 1347 12/25/15 1401  BP: 158/97 155/96  Pulse: 111   Height: 5\' 5"  (1.651 m)   Weight: 203 lb 11.2 oz (92.398 kg)   SpO2: 100%    Body mass index is 33.9 kg/(m^2).  General: A&O x 3, WD, ill appearing, obese  Head: [place]/AT  Ear/Nose/Throat: Hearing grossly intact, nares without erythema or drainage, oropharynx without Erythema/Exudate, Mallampati score: 3, OP w/ erythema w/o exudate,  Eyes: PERRLA, EOMI  Neck: Supple, no nuchal rigidity, mild palpable cervical LAD  Pulmonary: Sym exp, good air movt, CTAB, no rales, rhonchi, & wheezing, RLL BS less heard   Cardiac: RRR, Nl S1, S2, no Murmurs, rubs or gallops  Vascular: Vessel Right Left  Radial Palpable Palpable  Brachial Palpable Palpable  Carotid Palpable, without bruit Palpable, without bruit  Aorta Not palpable due to pannus N/A  Femoral Palpable Palpable  Popliteal Not palpable Not palpable  PT Palpable Not Palpable due to edema, dopplerable  DP Palpable Not Palpable due to edema, dopplerable   Gastrointestinal: soft, NTND, no G/R, no HSM, no masses, no  CVAT B  Musculoskeletal: M/S 5/5 throughout , Extremities without ischemic changes , no LDS, markedly asx LE, LLE 2-3+ edema, clean ulcer at anterior ankle: exposed fat without any tendon  Neurologic: CN 2-12 intact , Pain and light touch intact in extremities , Motor exam as listed above  Psychiatric: Judgment intact, Mood & affect appropriate for pt's clinical situation  Dermatologic: See M/S exam for extremity  exam, no rashes otherwise noted  Lymph : No Axillary, or Inguinal lymphadenopathy, +cervical LAD  4 outpatient clinic chart reviewed   Medical Decision Making  Jane [name] is a 40 y.o. female who presents with: LLE chronic venous insufficiency (C2)   Get ABI to document LLE waveforms  Will need LLE venous reflux duplex to evaluate for CVI vs lymphedema.  Patient will need continued wound care and compression.  Will refer to Wound Clinic for both.  Based on the patient's history and examination, I recommend: ABI and Venous reflux duplex.  The patient will follow up in 2-4 weeks for further evaluation with these studies.  Thank you for allowing [place] to participate in this patient's care.   Adele Barthel, MD [facility] Office: [phone] Pager: [phone]  12/25/2015, 2:23 PM

## 2015-12-28 NOTE — Addendum Note (Signed)
Addended by: Dorthula Rue L on: 12/28/2015 02:50 PM   Modules accepted: Orders

## 2015-12-29 NOTE — Progress Notes (Signed)
Internal Medicine Clinic Attending  Case discussed with Dr. Emokpae at the time of the visit.  We reviewed the resident's history and exam and pertinent patient test results.  I agree with the assessment, diagnosis, and plan of care documented in the resident's note.  

## 2016-01-01 ENCOUNTER — Ambulatory Visit (INDEPENDENT_AMBULATORY_CARE_PROVIDER_SITE_OTHER): Payer: BLUE CROSS/BLUE SHIELD | Admitting: Internal Medicine

## 2016-01-01 ENCOUNTER — Encounter: Payer: Self-pay | Admitting: Internal Medicine

## 2016-01-01 ENCOUNTER — Encounter: Payer: Self-pay | Admitting: Vascular Surgery

## 2016-01-01 DIAGNOSIS — E113393 Type 2 diabetes mellitus with moderate nonproliferative diabetic retinopathy without macular edema, bilateral: Secondary | ICD-10-CM | POA: Diagnosis not present

## 2016-01-01 DIAGNOSIS — E1165 Type 2 diabetes mellitus with hyperglycemia: Secondary | ICD-10-CM

## 2016-01-01 DIAGNOSIS — R600 Localized edema: Secondary | ICD-10-CM

## 2016-01-01 DIAGNOSIS — I10 Essential (primary) hypertension: Secondary | ICD-10-CM

## 2016-01-01 DIAGNOSIS — Z794 Long term (current) use of insulin: Secondary | ICD-10-CM

## 2016-01-01 DIAGNOSIS — E113399 Type 2 diabetes mellitus with moderate nonproliferative diabetic retinopathy without macular edema, unspecified eye: Secondary | ICD-10-CM

## 2016-01-01 DIAGNOSIS — R059 Cough, unspecified: Secondary | ICD-10-CM

## 2016-01-01 DIAGNOSIS — Z0000 Encounter for general adult medical examination without abnormal findings: Secondary | ICD-10-CM

## 2016-01-01 DIAGNOSIS — D509 Iron deficiency anemia, unspecified: Secondary | ICD-10-CM

## 2016-01-01 DIAGNOSIS — E1142 Type 2 diabetes mellitus with diabetic polyneuropathy: Secondary | ICD-10-CM

## 2016-01-01 DIAGNOSIS — R05 Cough: Secondary | ICD-10-CM

## 2016-01-01 DIAGNOSIS — Z9114 Patient's other noncompliance with medication regimen: Secondary | ICD-10-CM

## 2016-01-01 MED ORDER — BENZONATATE 100 MG PO CAPS: 100.0000 mg | ORAL_CAPSULE | Freq: Two times a day (BID) | ORAL | Status: DC | PRN

## 2016-01-01 MED ORDER — ATORVASTATIN CALCIUM 80 MG PO TABS: 80.0000 mg | ORAL_TABLET | Freq: Every day | ORAL | Status: DC

## 2016-01-01 MED ORDER — INSULIN ASPART PROT & ASPART (70-30 MIX) 100 UNIT/ML ~~LOC~~ SUSP: 18.0000 [IU] | Freq: Two times a day (BID) | SUBCUTANEOUS | Status: DC

## 2016-01-01 MED ORDER — HYDROCHLOROTHIAZIDE 12.5 MG PO CAPS: 12.5000 mg | ORAL_CAPSULE | Freq: Every day | ORAL | Status: DC

## 2016-01-01 NOTE — Patient Instructions (Addendum)
-  Start taking HCTZ 12.5 mg daily for your blood pressure, it will also help with your swelling -Start taking lipitor 80 mg daily for your high cholesterol  -Decrease your Novolog to 18 U twice a day because of low blood sugars, make sure you bring your meter next time -Will set you up to get IV iron at short stay -Try tessalon for your cough -Make sure you finish the clindamycin for you leg infection -Please come back in 2-3 months -Very nice seeing you!   General Instructions:   Please bring your medicines with you each time you come to clinic.  Medicines may include prescription medications, over-the-counter medications, herbal remedies, eye drops, vitamins, or other pills.   Progress Toward Treatment Goals:  Treatment Goal 09/07/2015  Hemoglobin A1C unable to assess  Blood pressure unchanged    Self Care Goals & Plans:  Self Care Goal 12/23/2015  Manage my medications take my medicines as prescribed; bring my medications to every visit; refill my medications on time  Monitor my health keep track of my blood glucose; bring my glucose meter and log to each visit  Eat healthy foods drink diet soda or water instead of juice or soda; eat foods that are low in salt; eat baked foods instead of fried foods; eat more vegetables  Be physically active find an activity I enjoy  Meeting treatment goals -    Home Blood Glucose Monitoring 09/07/2015  Check my blood sugar 2 times a day  When to check my blood sugar before breakfast; before dinner     Care Management & Community Referrals:  Referral 10/16/2012  Referrals made for care management support diabetes educator

## 2016-01-01 NOTE — Progress Notes (Signed)
Patient ID: Jane [name], female   DOB: [birth year], 40 y.o.   MRN: [id number]    Subjective:   Patient ID: Jane [name] female   DOB: [birth year] 40 y.o.   MRN: [id number]  HPI: Jane [name] is a 40 y.o. pleasant woman with past medical history of uncontrolled Type II DM, asthma, hyperlipidemia, iron-deficiency anemia, and allergic rhinitis who presents for hospital follow-up of left LE edema.    She was hospitalized from 2/8-12/24/15 for left LE edema that started on July of 2016 and worsened in October. She had CT venography which was negative. She also had left LE wound ulcer that was treated with IV clindamycin and then transitioned to oral clindamycin which she reports she still 5 more days to complete because she could not take them due to feeling sick. She reports the ulcer has improved and denies fever or chills. She was referred to [facility] and is to have ABI and Doppler US of her LE next week for further evaluation. Her swelling has improved after she was given compression wraps. She is to see wound care early next month.   Her last A1c was 10.8 on 12/10/15. She is compliant with taking Novolog (70/30) 20 U BID. She has been checking her blood glucose 1-2 times daily with range of 70-200. She unfortunately did not bring her meter today. She has occasional symptomatic hypoglycemia. She continues to have peripheral neuropathy and never took gabapentin that was prescribed last year. She has chronic blurry vision but denies polydipsia, polyuria, polyphagia, or foot injury/ulcer. She tries to follow a healthy diet and stay active. She has lost a few pounds since last visit. .   She is on lisinopril 10 mg daily for hypertension which she has not taking in the past few days. She has lightheadedness but denies chest pain or headache.    She is non-compliant with taking lipitor for hyperlipidemia but would like to restart.   She has history of iron deficency anemia in setting of  menorrhagia with last ferritin of 16 on 06/09/15 with recent Hg 7.5-8. Last pelvic US in 2012 did not reveal evidence of uterine fibroids. She continues to have heavy menstrual bleeding and is non-compliant with taking iron pills. She has chronic fatigue, dyspnea, feeling cold, lightheaded, and pica (craving for starch and dirt). She is interested in IV iron therapy.     Past Medical History  Diagnosis Date  . Asthma   . Hyperlipidemia   . Seasonal allergies   . Noncompliance with medications   . Peripheral neuropathy ([place])   . Diabetic retinopathy ([place])   . Iron deficiency anemia   . Menorrhagia   . Hypertension   . Pneumonia "several times"  . Type II diabetes mellitus (H[facility])     uncontrolled  . Sickle cell trait ([place])   . Depression    Current Outpatient Prescriptions  Medication Sig Dispense Refill  . albuterol (PROVENTIL HFA;VENTOLIN HFA) 108 (90 Base) MCG/ACT inhaler Inhale 2 puffs into the lungs every 4 (four) hours as needed for wheezing or shortness of breath (or coughing). 1 Inhaler 2  . clindamycin (CLEOCIN) 300 MG capsule Take 1 capsule (300 mg total) by mouth 3 (three) times daily. 30 capsule 0  . collagenase (SANTYL) ointment Apply topically daily. 15 g 0  . insulin aspart protamine- aspart (NOVOLOG MIX 70/30) (70-30) 100 UNIT/ML injection Inject 0.2 mLs (20 Units total) into the skin 2 (two) times daily with a meal. 10 mL 11  .  lisinopril (PRINIVIL,ZESTRIL) 5 MG tablet Take 1 tablet (5 mg total) by mouth daily. (Patient taking differently: Take 10 mg by mouth daily. ) 30 tablet 5  . ondansetron (ZOFRAN) 4 MG tablet Take 1 tablet (4 mg total) by mouth every 8 (eight) hours as needed for nausea or vomiting. 20 tablet 0   No current facility-administered medications for this visit.   Family History  Problem Relation Age of Onset  . Stroke Mother   . Diabetes Mother   . Hypertension Mother   . Aneurysm Mother   . Diabetes Father   . Hypertension Father   . Diabetes  Maternal Grandmother   . Diabetes Maternal Grandfather   . Cancer Paternal Grandfather     Prostate  . Diabetes Sister   . Hypertension Sister    Social History   Social History  . Marital Status: Single    Spouse Name: N/A  . Number of Children: 2  . Years of Education: 13   Occupational History  .   [employer]    Social History Main Topics  . Smoking status: Former Smoker -- 0.25 packs/day for 1 years    Types: Cigarettes    Quit date: 08/29/2015  . Smokeless tobacco: Never Used  . Alcohol Use: 0.0 oz/week    0 Standard drinks or equivalent per week     Comment: 12/23/2015 "last alcohol was ~ Christmas 2016; that was very little alcohol"  . Drug Use: Yes    Special: Marijuana     Comment: 12/23/2015 "used to smoke marijuana; quit 04/2013"  . Sexual Activity: Yes    Birth Control/ Protection: Surgical   Other Topics Concern  . Not on file   Social History Narrative   Patient does not drink caffeine.   Patient is right handed.    Review of Systems: Review of Systems  Constitutional: Positive for weight loss. Negative for fever and chills.       Chronically feeling cold and fatigued  Eyes: Positive for blurred vision (chronic).  Respiratory: Positive for cough (dry) and shortness of breath (occasionally ).   Cardiovascular: Positive for leg swelling (chronic in left LE). Negative for chest pain.  Gastrointestinal: Negative for nausea, vomiting, diarrhea and constipation.  Genitourinary: Negative for dysuria, urgency and frequency.  Skin:       Left LE ulcer  Neurological: Positive for dizziness (chronic lightheadedeness  ) and sensory change (chronic peripheral neuoropathy). Negative for headaches.  Endo/Heme/Allergies: Negative for polydipsia.     Objective:  Physical Exam: Filed Vitals:   01/01/16 1311  BP: 165/88  Pulse: 98  Temp: 98.1 F (36.7 C)  TempSrc: Oral  Weight: 199 lb 1.6 oz (90.311 kg)  SpO2: 100%    Physical Exam  Constitutional: She is  oriented to person, place, and time. She appears well-developed. No distress.  Obese  HENT:  Head: Normocephalic and atraumatic.  Right Ear: External ear normal.  Left Ear: External ear normal.  Nose: Nose normal.  Mouth/Throat: Oropharynx is clear and moist. No oropharyngeal exudate.  Eyes: Conjunctivae and EOM are normal. Pupils are equal, round, and reactive to light. Right eye exhibits no discharge. Left eye exhibits no discharge. No scleral icterus.  Neck: Normal range of motion. Neck supple.  Cardiovascular: Normal rate, regular rhythm and normal heart sounds.   Pulmonary/Chest: Effort normal. No respiratory distress. She has no wheezes. She has no rales.  Decreased BS at bases   Abdominal: Soft. Bowel sounds are normal. She exhibits no distension.  There is no tenderness. There is no rebound and no guarding.  Musculoskeletal: Normal range of motion. She exhibits edema (+2 of left LE with compression wrapping on ) and tenderness (left LE).  Neurological: She is alert and oriented to person, place, and time.  Skin: Skin is warm and dry. She is not diaphoretic. No erythema. No pallor.  Did not examined left LE ulcer as it was wrapped  Psychiatric: She has a normal mood and affect. Her behavior is normal. Judgment and thought content normal.    Assessment & Plan:   Please see problem list for problem-based assessment and plan

## 2016-01-02 ENCOUNTER — Encounter (HOSPITAL_COMMUNITY): Payer: Self-pay | Admitting: *Deleted

## 2016-01-02 DIAGNOSIS — J069 Acute upper respiratory infection, unspecified: Secondary | ICD-10-CM | POA: Diagnosis not present

## 2016-01-02 DIAGNOSIS — Z87891 Personal history of nicotine dependence: Secondary | ICD-10-CM | POA: Insufficient documentation

## 2016-01-02 DIAGNOSIS — I10 Essential (primary) hypertension: Secondary | ICD-10-CM | POA: Insufficient documentation

## 2016-01-02 DIAGNOSIS — E785 Hyperlipidemia, unspecified: Secondary | ICD-10-CM | POA: Diagnosis not present

## 2016-01-02 DIAGNOSIS — E119 Type 2 diabetes mellitus without complications: Secondary | ICD-10-CM | POA: Insufficient documentation

## 2016-01-02 DIAGNOSIS — R079 Chest pain, unspecified: Secondary | ICD-10-CM | POA: Insufficient documentation

## 2016-01-02 DIAGNOSIS — R600 Localized edema: Secondary | ICD-10-CM | POA: Insufficient documentation

## 2016-01-02 DIAGNOSIS — Z794 Long term (current) use of insulin: Secondary | ICD-10-CM | POA: Diagnosis not present

## 2016-01-02 DIAGNOSIS — Z8742 Personal history of other diseases of the female genital tract: Secondary | ICD-10-CM | POA: Diagnosis not present

## 2016-01-02 DIAGNOSIS — Z8701 Personal history of pneumonia (recurrent): Secondary | ICD-10-CM | POA: Insufficient documentation

## 2016-01-02 DIAGNOSIS — R42 Dizziness and giddiness: Secondary | ICD-10-CM | POA: Insufficient documentation

## 2016-01-02 DIAGNOSIS — R110 Nausea: Secondary | ICD-10-CM | POA: Insufficient documentation

## 2016-01-02 DIAGNOSIS — Z8669 Personal history of other diseases of the nervous system and sense organs: Secondary | ICD-10-CM | POA: Diagnosis not present

## 2016-01-02 DIAGNOSIS — Z8659 Personal history of other mental and behavioral disorders: Secondary | ICD-10-CM | POA: Diagnosis not present

## 2016-01-02 DIAGNOSIS — Z792 Long term (current) use of antibiotics: Secondary | ICD-10-CM | POA: Insufficient documentation

## 2016-01-02 DIAGNOSIS — J45909 Unspecified asthma, uncomplicated: Secondary | ICD-10-CM | POA: Insufficient documentation

## 2016-01-02 DIAGNOSIS — Z79899 Other long term (current) drug therapy: Secondary | ICD-10-CM | POA: Diagnosis not present

## 2016-01-02 DIAGNOSIS — R05 Cough: Secondary | ICD-10-CM | POA: Diagnosis present

## 2016-01-02 DIAGNOSIS — Z862 Personal history of diseases of the blood and blood-forming organs and certain disorders involving the immune mechanism: Secondary | ICD-10-CM | POA: Diagnosis not present

## 2016-01-02 MED ORDER — LISINOPRIL 5 MG PO TABS: 10.0000 mg | ORAL_TABLET | Freq: Every day | ORAL | Status: DC
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
## 2016-01-02 NOTE — Assessment & Plan Note (Addendum)
Assessment: Pt with last A1c of 10.8 on 12/10/15 compliant with insulin therapy with recent symptomatic hypoglycemia.  Plan:  -A1c 10.8 not at goal <7,  decrease Novolog 70/30 from 20 U BID to 18 U BID due to lows. Pt could not tolerate metformin. Consider GLP-1 agonist at next visit. Pt instructed to bring glucose meter at next visit for further insulin adjustment.  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

## 2016-01-02 NOTE — ED Notes (Signed)
Patient presents via EMS to triage with c/o cough and flu like symptoms.  Has been staying with her sister and has been unable to get to her insulin (CBG 271)  Has left foot edema which she is being treated for.  At this time she is in a homeless situation

## 2016-01-02 NOTE — Assessment & Plan Note (Signed)
Assessment: Pt with moderately well-controlled hypertension compliant with one-class (ACEi) anti-hypertensive therapy who presents with blood pressure of 165/88.  Plan: -BP 165/88 not at goal <140/90  -Continue lisinopril 10 mg daily in setting of proteinuria -Start HCTZ 12.5 mg daily in setting of LE edema -Last BMP on 12/24/15 with no CKD

## 2016-01-02 NOTE — Assessment & Plan Note (Addendum)
Assessment: Pt is a menstruating woman with iron-deficiency anemia most likely due to menorrhagia with no evidence of uterine fibroids on last [place] on 11/28/10 non-compliant with oral iron therapy with last ferritin of 16 on 10/25/13 and recent Hg 7.5-8 who presents with symptotic anemia with no hemodynamic instability.   Plan:  -Due to pt's noncompliance with oral iron therapy and symptomatic anemia will order IV feraheme 510 mg weekly for 2 doses (scheduled at short stay at [facility])   -Obtain CBC in 3 months to assess for response to therapy -Pt did not attend previous gynecology referral appointment

## 2016-01-02 NOTE — Assessment & Plan Note (Signed)
Inquire regarding pap smear/HPV testing at next visit

## 2016-01-03 ENCOUNTER — Emergency Department (HOSPITAL_COMMUNITY): Payer: BLUE CROSS/BLUE SHIELD

## 2016-01-03 ENCOUNTER — Emergency Department (HOSPITAL_COMMUNITY)
Admission: EM | Admit: 2016-01-03 | Discharge: 2016-01-03 | Disposition: A | Discharge status: Home / Self Care | Payer: BLUE CROSS/BLUE SHIELD | Attending: Emergency Medicine | Admitting: Emergency Medicine

## 2016-01-03 DIAGNOSIS — J069 Acute upper respiratory infection, unspecified: Secondary | ICD-10-CM

## 2016-01-03 LAB — COMPREHENSIVE METABOLIC PANEL
ALBUMIN: 2.1 g/dL — AB (ref 3.5–5.0)
ALK PHOS: 83 U/L (ref 38–126)
ALT: 12 U/L — AB (ref 14–54)
ANION GAP: 8 (ref 5–15)
AST: 19 U/L (ref 15–41)
BILIRUBIN TOTAL: 0.4 mg/dL (ref 0.3–1.2)
BUN: 15 mg/dL (ref 6–20)
CALCIUM: 8.2 mg/dL — AB (ref 8.9–10.3)
CO2: 27 mmol/L (ref 22–32)
CREATININE: 1.21 mg/dL — AB (ref 0.44–1.00)
Chloride: 103 mmol/L (ref 101–111)
GFR calc Af Amer: >60 mL/min (ref >60)
GFR calc non Af Amer: 55 mL/min — ABNORMAL LOW (ref >60)
GLUCOSE: 187 mg/dL — AB (ref 65–99)
Potassium: 3.9 mmol/L (ref 3.5–5.1)
SODIUM: 138 mmol/L (ref 135–145)
TOTAL PROTEIN: 6.5 g/dL (ref 6.5–8.1)

## 2016-01-03 LAB — CBC WITH DIFFERENTIAL/PLATELET
BASOS ABS: 0 10*3/uL (ref 0.0–0.1)
Basophils Relative: 0 %
Eosinophils Absolute: 0.2 10*3/uL (ref 0.0–0.7)
Eosinophils Relative: 6 %
HEMATOCRIT: 27.5 % — AB (ref 36.0–46.0)
HEMOGLOBIN: 8 g/dL — AB (ref 12.0–15.0)
Lymphocytes Relative: 48 %
Lymphs Abs: 2 10*3/uL (ref 0.7–4.0)
MCH: 20.6 pg — ABNORMAL LOW (ref 26.0–34.0)
MCHC: 29.1 g/dL — AB (ref 30.0–36.0)
MCV: 70.9 fL — ABNORMAL LOW (ref 78.0–100.0)
MONOS PCT: 14 %
Monocytes Absolute: 0.5 10*3/uL (ref 0.1–1.0)
NEUTROS ABS: 1.2 10*3/uL — AB (ref 1.7–7.7)
NEUTROS PCT: 32 %
Platelets: 402 10*3/uL — ABNORMAL HIGH (ref 150–400)
RBC: 3.88 MIL/uL (ref 3.87–5.11)
RDW: 16.6 % — ABNORMAL HIGH (ref 11.5–15.5)
WBC: 3.9 10*3/uL — AB (ref 4.0–10.5)

## 2016-01-03 LAB — CBG MONITORING, ED: Glucose-Capillary: 166 mg/dL — ABNORMAL HIGH (ref 65–99)

## 2016-01-03 MED ORDER — ONDANSETRON 4 MG PO TBDP: 4.0000 mg | ORAL_TABLET | Freq: Three times a day (TID) | ORAL | Status: DC | PRN

## 2016-01-03 MED ORDER — SODIUM CHLORIDE 0.9 % IV BOLUS (SEPSIS)
1000.0000 mL | Freq: Once | INTRAVENOUS | Status: AC
  Administered 2016-01-03: 1000 mL via INTRAVENOUS

## 2016-01-03 NOTE — ED Provider Notes (Signed)
CSN: [id number]     Arrival date & time 01/02/16  2243 History  By signing my name below, I, [facility], attest that this documentation has been prepared under the direction and in the presence of Merryl Hacker, MD. Electronically Signed: Virgel Bouquet, ED Scribe. 01/03/2016. 7:09 AM.   Chief Complaint  Patient presents with  . Cough  . Flu like symptoms    The history is provided by the patient. No language interpreter was used.   HPI Comments: Jane [name] is a 40 y.o. female who presents to the Emergency Department complaining of an intermittent, unchanging cough onset 1 week ago. Patient reports that she received the influenza vaccine 1 week ago followed by onset of her symptoms. She endorses CP secondary to cough, nausea, chills,  body aches, dizziness that she describes as being off-balance, and left leg edema for which she is already being treated. Dizziness is worse with standing. Per patient, she has not measured her CBG (CBG 271 in ED today) or taken her insulin because it was at her sister's house where she could not access it. Denies diarrhea and abdominal pain.   Past Medical History  Diagnosis Date  . Asthma   . Hyperlipidemia   . Seasonal allergies   . Noncompliance with medications   . Peripheral neuropathy ([place])   . Diabetic retinopathy ([place])   . Iron deficiency anemia   . Menorrhagia   . Hypertension   . Pneumonia "several times"  . Type II diabetes mellitus (H[facility])     uncontrolled  . Sickle cell trait ([place])   . Depression    Past Surgical History  Procedure Laterality Date  . Cesarean section  1997  . Tubal ligation  1997   Family History  Problem Relation Age of Onset  . Stroke Mother   . Diabetes Mother   . Hypertension Mother   . Aneurysm Mother   . Diabetes Father   . Hypertension Father   . Diabetes Maternal Grandmother   . Diabetes Maternal Grandfather   . Cancer Paternal Grandfather     Prostate  . Diabetes Sister   .  Hypertension Sister    Social History  Substance Use Topics  . Smoking status: Former Smoker -- 0.25 packs/day for 1 years    Types: Cigarettes    Quit date: 08/29/2015  . Smokeless tobacco: Never Used  . Alcohol Use: 0.0 oz/week    0 Standard drinks or equivalent per week     Comment: 12/23/2015 "last alcohol was ~ Christmas 2016; that was very little alcohol"   OB History    No data available     Review of Systems  Constitutional: Positive for chills.  Respiratory: Positive for cough.   Cardiovascular: Positive for chest pain (secondary to cough) and leg swelling.  Gastrointestinal: Positive for nausea. Negative for abdominal pain and diarrhea.  Neurological: Positive for dizziness.  All other systems reviewed and are negative.     Allergies  Review of patient's allergies indicates no known allergies.  Home Medications   Prior to Admission medications   Medication Sig Start Date End Date Taking? Authorizing Provider  albuterol (PROVENTIL HFA;VENTOLIN HFA) 108 (90 Base) MCG/ACT inhaler Inhale 2 puffs into the lungs every 4 (four) hours as needed for wheezing or shortness of breath (or coughing). [id number]   Delora Fuel, MD  atorvastatin (LIPITOR) 80 MG tablet Take 1 tablet (80 mg total) by mouth daily. 01/01/16 12/31/16  Juluis Mire, MD  benzonatate (TESSALON PERLES)  100 MG capsule Take 1 capsule (100 mg total) by mouth 2 (two) times daily as needed for cough. 01/01/16 12/31/16  Juluis Mire, MD  clindamycin (CLEOCIN) 300 MG capsule Take 1 capsule (300 mg total) by mouth 3 (three) times daily. 12/21/15   Ejiroghene Arlyce Dice, MD  collagenase (SANTYL) ointment Apply topically daily. 12/24/15   Burgess Estelle, MD  hydrochlorothiazide (MICROZIDE) 12.5 MG capsule Take 1 capsule (12.5 mg total) by mouth daily. 01/01/16   Juluis Mire, MD  insulin aspart protamine- aspart (NOVOLOG MIX 70/30) (70-30) 100 UNIT/ML injection Inject 0.18 mLs (18 Units total) into the skin 2 (two) times daily  with a meal. 01/01/16   Juluis Mire, MD  lisinopril (PRINIVIL,ZESTRIL) 5 MG tablet Take 2 tablets (10 mg total) by mouth daily. 01/02/16   Juluis Mire, MD  ondansetron (ZOFRAN ODT) 4 MG disintegrating tablet Take 1 tablet (4 mg total) by mouth every 8 (eight) hours as needed for nausea or vomiting. 01/03/16   Merryl Hacker, MD  ondansetron (ZOFRAN) 4 MG tablet Take 1 tablet (4 mg total) by mouth every 8 (eight) hours as needed for nausea or vomiting. 12/24/15   Burgess Estelle, MD   BP 166/83 mmHg  Pulse 96  Temp(Src) 98.6 F (37 C) (Oral)  Resp 18  Ht 5\' 5"  (1.651 m)  Wt 199 lb (90.266 kg)  BMI 33.12 kg/m2  SpO2 94%  LMP 12/17/2015 Physical Exam  Constitutional: She is oriented to person, place, and time. She appears well-developed and well-nourished.  HENT:  Head: Normocephalic and atraumatic.  Cardiovascular: Normal rate, regular rhythm and normal heart sounds.   Pulmonary/Chest: Effort normal and breath sounds normal. No respiratory distress. She has no wheezes.  Abdominal: Soft. Bowel sounds are normal.  Musculoskeletal: She exhibits edema.  Left greater than right lower extremity edema, pitting  Neurological: She is alert and oriented to person, place, and time.  No dysmetria to finger-nose-finger, 5 out of 5 strength in all 4 extremities  Skin: Skin is warm and dry.  Psychiatric: She has a normal mood and affect.  Nursing note and vitals reviewed.   ED Course  Procedures   DIAGNOSTIC STUDIES: Oxygen Saturation is 100% on RA, normal by my interpretation.    COORDINATION OF CARE: 3:30 AM Discussed treatment plan with pt at bedside and pt agreed to plan.   Labs Review Labs Reviewed  CBC WITH DIFFERENTIAL/PLATELET - Abnormal; Notable for the following:    WBC 3.9 (*)    Hemoglobin 8.0 (*)    HCT 27.5 (*)    MCV 70.9 (*)    MCH 20.6 (*)    MCHC 29.1 (*)    RDW 16.6 (*)    Platelets 402 (*)    Neutro Abs 1.2 (*)    All other components within normal limits   COMPREHENSIVE METABOLIC PANEL - Abnormal; Notable for the following:    Glucose, Bld 187 (*)    Creatinine, Ser 1.21 (*)    Calcium 8.2 (*)    Albumin 2.1 (*)    ALT 12 (*)    GFR calc non Af Amer 55 (*)    All other components within normal limits  CBG MONITORING, ED - Abnormal; Notable for the following:    Glucose-Capillary 166 (*)    All other components within normal limits    Imaging Review Dg Chest 2 View  01/03/2016  CLINICAL DATA:  Intermittent cough with onset 1 week ago. Influenza vaccine 1 week ago. Chest pain, nausea, chills,  body aches, and dizziness. EXAM: CHEST  2 VIEW COMPARISON:  11/24/2015 FINDINGS: The heart size and mediastinal contours are within normal limits. Both lungs are clear. The visualized skeletal structures are unremarkable. IMPRESSION: No active cardiopulmonary disease. Electronically Signed   By: Lucienne Capers M.D.   On: 01/03/2016 04:22   I have personally reviewed and evaluated these images and lab results as part of my medical decision-making.   EKG Interpretation None      MDM   Final diagnoses:  Upper respiratory infection    Patient presents with upper respiratory symptoms, chills, body aches, dizziness. Nontoxic on exam. Chronic left lower shin edema. Extensive workup as an outpatient. No signs or symptoms of acute infection.  Lab work is largely reassuring and at the patient's baseline. No evidence of orthostasis. EKG is reassuring. Chest x-ray is without pneumonia. Patient likely has an upper respiratory virus. She was able to tolerate fluids and hydrate independently. Patient ambulated independently. Discussed with patient supportive measures at home.  After history, exam, and medical workup I feel the patient has been appropriately medically screened and is safe for discharge home. Pertinent diagnoses were discussed with the patient. Patient was given return precautions.  I personally performed the services described in this  documentation, which was scribed in my presence. The recorded information has been reviewed and is accurate.    Merryl Hacker, MD 01/03/16 [phone]

## 2016-01-03 NOTE — Discharge Instructions (Signed)
Upper Respiratory Infection, Adult Most upper respiratory infections (URIs) are a viral infection of the air passages leading to the lungs. A URI affects the nose, throat, and upper air passages. The most common type of URI is nasopharyngitis and is typically referred to as "the common cold." URIs run their course and usually go away on their own. Most of the time, a URI does not require medical attention, but sometimes a bacterial infection in the upper airways can follow a viral infection. This is called a secondary infection. Sinus and middle ear infections are common types of secondary upper respiratory infections. Bacterial pneumonia can also complicate a URI. A URI can worsen asthma and chronic obstructive pulmonary disease (COPD). Sometimes, these complications can require emergency medical care and may be life threatening.  CAUSES Almost all URIs are caused by viruses. A virus is a type of germ and can spread from one person to another.  RISKS FACTORS You may be at risk for a URI if:   You smoke.   You have chronic heart or lung disease.  You have a weakened defense (immune) system.   You are very young or very old.   You have nasal allergies or asthma.  You work in crowded or poorly ventilated areas.  You work in health care facilities or schools. SIGNS AND SYMPTOMS  Symptoms typically develop 2-3 days after you come in contact with a cold virus. Most viral URIs last 7-10 days. However, viral URIs from the influenza virus (flu virus) can last 14-18 days and are typically more severe. Symptoms may include:   Runny or stuffy (congested) nose.   Sneezing.   Cough.   Sore throat.   Headache.   Fatigue.   Fever.   Loss of appetite.   Pain in your forehead, behind your eyes, and over your cheekbones (sinus pain).  Muscle aches.  DIAGNOSIS  Your health care provider may diagnose a URI by:  Physical exam.  Tests to check that your symptoms are not due to  another condition such as:  Strep throat.  Sinusitis.  Pneumonia.  Asthma. TREATMENT  A URI goes away on its own with time. It cannot be cured with medicines, but medicines may be prescribed or recommended to relieve symptoms. Medicines may help:  Reduce your fever.  Reduce your cough.  Relieve nasal congestion. HOME CARE INSTRUCTIONS   Take medicines only as directed by your health care provider.   Gargle warm saltwater or take cough drops to comfort your throat as directed by your health care provider.  Use a warm mist humidifier or inhale steam from a shower to increase air moisture. This may make it easier to breathe.  Drink enough fluid to keep your urine clear or pale yellow.   Eat soups and other clear broths and maintain good nutrition.   Rest as needed.   Return to work when your temperature has returned to normal or as your health care provider advises. You may need to stay home longer to avoid infecting others. You can also use a face mask and careful hand washing to prevent spread of the virus.  Increase the usage of your inhaler if you have asthma.   Do not use any tobacco products, including cigarettes, chewing tobacco, or electronic cigarettes. If you need help quitting, ask your health care provider. PREVENTION  The best way to protect yourself from getting a cold is to practice good hygiene.   Avoid oral or hand contact with people with cold   symptoms.   Wash your hands often if contact occurs.  There is no clear evidence that vitamin C, vitamin E, echinacea, or exercise reduces the chance of developing a cold. However, it is always recommended to get plenty of rest, exercise, and practice good nutrition.  SEEK MEDICAL CARE IF:   You are getting worse rather than better.   Your symptoms are not controlled by medicine.   You have chills.  You have worsening shortness of breath.  You have brown or red mucus.  You have yellow or brown nasal  discharge.  You have pain in your face, especially when you bend forward.  You have a fever.  You have swollen neck glands.  You have pain while swallowing.  You have white areas in the back of your throat. SEEK IMMEDIATE MEDICAL CARE IF:   You have severe or persistent:  Headache.  Ear pain.  Sinus pain.  Chest pain.  You have chronic lung disease and any of the following:  Wheezing.  Prolonged cough.  Coughing up blood.  A change in your usual mucus.  You have a stiff neck.  You have changes in your:  Vision.  Hearing.  Thinking.  Mood. MAKE SURE YOU:   Understand these instructions.  Will watch your condition.  Will get help right away if you are not doing well or get worse.   This information is not intended to replace advice given to you by your health care provider. Make sure you discuss any questions you have with your health care provider.   Document Released: 04/26/2001 Document Revised: 03/17/2015 Document Reviewed: 02/05/2014 Elsevier Interactive Patient Education 2016 [employer]  

## 2016-01-03 NOTE — ED Notes (Signed)
Taken to xray at this time. 

## 2016-01-04 ENCOUNTER — Ambulatory Visit (HOSPITAL_COMMUNITY)
Admission: RE | Admit: 2016-01-04 | Discharge: 2016-01-04 | Disposition: A | Discharge status: Home / Self Care | Payer: BLUE CROSS/BLUE SHIELD | Source: Ambulatory Visit | Attending: Vascular Surgery | Admitting: Vascular Surgery

## 2016-01-04 ENCOUNTER — Ambulatory Visit (INDEPENDENT_AMBULATORY_CARE_PROVIDER_SITE_OTHER)
Admission: RE | Admit: 2016-01-04 | Discharge: 2016-01-04 | Disposition: A | Discharge status: Home / Self Care | Payer: BLUE CROSS/BLUE SHIELD | Source: Ambulatory Visit | Attending: Vascular Surgery | Admitting: Vascular Surgery

## 2016-01-04 DIAGNOSIS — Z87891 Personal history of nicotine dependence: Secondary | ICD-10-CM | POA: Insufficient documentation

## 2016-01-04 DIAGNOSIS — L97329 Non-pressure chronic ulcer of left ankle with unspecified severity: Secondary | ICD-10-CM | POA: Diagnosis not present

## 2016-01-04 DIAGNOSIS — I10 Essential (primary) hypertension: Secondary | ICD-10-CM | POA: Diagnosis not present

## 2016-01-04 DIAGNOSIS — I83023 Varicose veins of left lower extremity with ulcer of ankle: Secondary | ICD-10-CM

## 2016-01-04 DIAGNOSIS — E119 Type 2 diabetes mellitus without complications: Secondary | ICD-10-CM | POA: Insufficient documentation

## 2016-01-04 DIAGNOSIS — E785 Hyperlipidemia, unspecified: Secondary | ICD-10-CM | POA: Diagnosis not present

## 2016-01-04 NOTE — Progress Notes (Signed)
Medicine attending: Medical history, presenting problems, physical findings, and medications, reviewed with resident physician Dr Marjan Rabbani on the day of the patient visit and I concur with her evaluation and management plan. 

## 2016-01-07 ENCOUNTER — Ambulatory Visit (HOSPITAL_COMMUNITY)
Admission: RE | Admit: 2016-01-07 | Discharge: 2016-01-07 | Disposition: A | Discharge status: Home / Self Care | Payer: BLUE CROSS/BLUE SHIELD | Source: Ambulatory Visit | Attending: Internal Medicine | Admitting: Internal Medicine

## 2016-01-07 VITALS — BP 180/117 | HR 97 | Resp 20

## 2016-01-07 DIAGNOSIS — D509 Iron deficiency anemia, unspecified: Secondary | ICD-10-CM | POA: Diagnosis not present

## 2016-01-07 LAB — HM DIABETES EYE EXAM

## 2016-01-07 MED ORDER — SODIUM CHLORIDE 0.9 % IV SOLN
510.0000 mg | INTRAVENOUS | Status: DC
  Administered 2016-01-07: 510 mg via INTRAVENOUS
  Filled 2016-01-07: qty 17

## 2016-01-07 NOTE — Discharge Instructions (Signed)

## 2016-01-08 ENCOUNTER — Ambulatory Visit (INDEPENDENT_AMBULATORY_CARE_PROVIDER_SITE_OTHER): Payer: BLUE CROSS/BLUE SHIELD | Admitting: Vascular Surgery

## 2016-01-08 ENCOUNTER — Encounter: Payer: Self-pay | Admitting: Vascular Surgery

## 2016-01-08 VITALS — BP 183/97 | HR 91 | Temp 98.9°F | Resp 18 | Ht 65.0 in | Wt 198.3 lb

## 2016-01-08 DIAGNOSIS — I779 Disorder of arteries and arterioles, unspecified: Secondary | ICD-10-CM

## 2016-01-08 DIAGNOSIS — L97329 Non-pressure chronic ulcer of left ankle with unspecified severity: Principal | ICD-10-CM

## 2016-01-08 DIAGNOSIS — I83023 Varicose veins of left lower extremity with ulcer of ankle: Secondary | ICD-10-CM

## 2016-01-08 NOTE — Progress Notes (Signed)
Established Venous Insufficiency   History of Present Illness  Jane [name] is a 40 y.o. (03-Jun-1976) female who presents with chief complaint: continue L ankle ulcer.  The patient's symptoms have not progressed.  The patient's symptoms are: poorly healing ulcer on L ankle.  The patient notes she thinks the ulcer is better.  The patient is scheduled to see wound care clinic in early March.  She has not started wear compression stocking yet.  The patient's PMH, PSH, SH, and FamHx are unchanged from 12/25/15.  Current Outpatient Prescriptions  Medication Sig Dispense Refill  . albuterol (PROVENTIL HFA;VENTOLIN HFA) 108 (90 Base) MCG/ACT inhaler Inhale 2 puffs into the lungs every 4 (four) hours as needed for wheezing or shortness of breath (or coughing). 1 Inhaler 2  . atorvastatin (LIPITOR) 80 MG tablet Take 1 tablet (80 mg total) by mouth daily. 90 tablet 0  . benzonatate (TESSALON PERLES) 100 MG capsule Take 1 capsule (100 mg total) by mouth 2 (two) times daily as needed for cough. 30 capsule 0  . clindamycin (CLEOCIN) 300 MG capsule Take 1 capsule (300 mg total) by mouth 3 (three) times daily. 30 capsule 0  . collagenase (SANTYL) ointment Apply topically daily. 15 g 0  . hydrochlorothiazide (MICROZIDE) 12.5 MG capsule Take 1 capsule (12.5 mg total) by mouth daily. 30 capsule 2  . insulin aspart protamine- aspart (NOVOLOG MIX 70/30) (70-30) 100 UNIT/ML injection Inject 0.18 mLs (18 Units total) into the skin 2 (two) times daily with a meal. 10 mL 11  . lisinopril (PRINIVIL,ZESTRIL) 5 MG tablet Take 2 tablets (10 mg total) by mouth daily. 30 tablet 5  . ondansetron (ZOFRAN ODT) 4 MG disintegrating tablet Take 1 tablet (4 mg total) by mouth every 8 (eight) hours as needed for nausea or vomiting. 20 tablet 0  . ondansetron (ZOFRAN) 4 MG tablet Take 1 tablet (4 mg total) by mouth every 8 (eight) hours as needed for nausea or vomiting. 20 tablet 0   No current facility-administered  medications for this visit.    No Known Allergies  On ROS today: no fever or chills, no rest pain, +intermittent claudication    Physical Examination  Filed Vitals:   01/08/16 1053 01/08/16 1057  BP: 181/100 183/97  Pulse: 91   Temp: 98.9 F (37.2 C)   TempSrc: Oral   Resp: 18   Height: 5\' 5"  (1.651 m)   Weight: 198 lb 4.8 oz (89.948 kg)   SpO2: 99%    Body mass index is 33 kg/(m^2).  General: A&O x 3, WD, obese  Pulmonary: Sym exp, good air movt, CTAB, no rales, rhonchi, & wheezing  Cardiac: RRR, Nl S1, S2, no Murmurs, rubs or gallops  Vascular: Vessel Right Left  Radial Palpable Palpable  Brachial Palpable Palpable  Carotid Palpable, without bruit Palpable, without bruit  Aorta Not palpable due to pannus N/A  Femoral Palpable Palpable  Popliteal Not palpable Not palpable  PT Palpable Not Palpable  DP Palpable Not Palpable   Gastrointestinal: soft, NTND, no G/R, no HSM, - masses, no CVAT B  Musculoskeletal: M/S 5/5 throughout , Extremities without ischemic changes except anterior ankle ulcer: clean without drainage, smaller than previous, L foot>calf edema 2-3+  Neurologic: Pain and light touch intact in extremities , Motor exam as listed above   Non-Invasive Vascular Imaging  BLE Venous Insufficiency Duplex (Date: 01/04/16):   RLE:   no DVT and SVT,   no GSV reflux,   no SSV reflux,  no deep venous reflux  LLE:  no DVT and SVT,   + GSV reflux: 4.1-5.7 mm  no SSV reflux,  + deep venous reflux: SFJ  ABI (Date: 01/08/2016)  R:   ABI: [place]  DP: tri,   PT: tri,   TBI: [place]  L:   ABI: [place]  DP: mono,   PT: mono,   TBI: [place]   Medical Decision Making  Jane [name] is a 40 y.o. female who presents with: LLE leg chronic venous insufficiency (C6), Venous stasis ulcer, possible lymphedema, RLE minimal PAD, LLE moderate PAD   Patient has evidence of significant GSV reflux which might account for some her LLE swelling.  I would not  be surprised however that she has an element of lymphedema also given the abnormal swelling distribution in her left foot.  I suspect wound care is going to put her in compression: unna boot vs Profore wrap to help manage her VSU.  I would let them work on the wound first.   I doubt compression will interfere with her arterial flow as her arterial down to her digital arteries are NOT compressible with a blood pressure cuff anyway.  If wound care can get her to heal without any surgical intervention, this would be best, as I'm not certain she would be revascularization candidate as her arterial system is so diseased even her toe arteries can't be compressed.  Once her wound is healed, would start 3 month trial of compression stockings and consider her for EVLA L GSV eventually.    If she fails to heal, she will an: aortogram, bilateral leg runoff, and possible left leg intervention.  She will follow up in one month.  She knows to follow up more rapidly if her wound worses.  Thank you for allowing [place] to participate in this patient's care.   Adele Barthel, MD [facility] Office: [phone] Pager: [phone]  01/08/2016, 12:25 PM

## 2016-01-14 ENCOUNTER — Ambulatory Visit (HOSPITAL_COMMUNITY): Payer: BLUE CROSS/BLUE SHIELD

## 2016-01-18 ENCOUNTER — Telehealth: Payer: Self-pay | Admitting: Internal Medicine

## 2016-01-18 ENCOUNTER — Inpatient Hospital Stay (HOSPITAL_COMMUNITY)
Admission: AD | Admit: 2016-01-18 | Discharge: 2016-01-19 | Disposition: A | Discharge status: Home / Self Care | Payer: BLUE CROSS/BLUE SHIELD | Source: Ambulatory Visit | Attending: Family Medicine | Admitting: Family Medicine

## 2016-01-18 ENCOUNTER — Encounter (HOSPITAL_BASED_OUTPATIENT_CLINIC_OR_DEPARTMENT_OTHER): Payer: BLUE CROSS/BLUE SHIELD | Attending: Internal Medicine

## 2016-01-18 DIAGNOSIS — E1151 Type 2 diabetes mellitus with diabetic peripheral angiopathy without gangrene: Secondary | ICD-10-CM | POA: Insufficient documentation

## 2016-01-18 DIAGNOSIS — Z87891 Personal history of nicotine dependence: Secondary | ICD-10-CM | POA: Insufficient documentation

## 2016-01-18 DIAGNOSIS — E11622 Type 2 diabetes mellitus with other skin ulcer: Secondary | ICD-10-CM | POA: Insufficient documentation

## 2016-01-18 DIAGNOSIS — E1140 Type 2 diabetes mellitus with diabetic neuropathy, unspecified: Secondary | ICD-10-CM | POA: Insufficient documentation

## 2016-01-18 DIAGNOSIS — L97821 Non-pressure chronic ulcer of other part of left lower leg limited to breakdown of skin: Secondary | ICD-10-CM | POA: Insufficient documentation

## 2016-01-18 DIAGNOSIS — F329 Major depressive disorder, single episode, unspecified: Secondary | ICD-10-CM | POA: Insufficient documentation

## 2016-01-18 DIAGNOSIS — E1142 Type 2 diabetes mellitus with diabetic polyneuropathy: Secondary | ICD-10-CM | POA: Insufficient documentation

## 2016-01-18 DIAGNOSIS — D573 Sickle-cell trait: Secondary | ICD-10-CM | POA: Insufficient documentation

## 2016-01-18 DIAGNOSIS — Z9114 Patient's other noncompliance with medication regimen: Secondary | ICD-10-CM | POA: Insufficient documentation

## 2016-01-18 DIAGNOSIS — I890 Lymphedema, not elsewhere classified: Secondary | ICD-10-CM | POA: Insufficient documentation

## 2016-01-18 DIAGNOSIS — Z794 Long term (current) use of insulin: Secondary | ICD-10-CM | POA: Insufficient documentation

## 2016-01-18 DIAGNOSIS — B373 Candidiasis of vulva and vagina: Secondary | ICD-10-CM

## 2016-01-18 DIAGNOSIS — E11319 Type 2 diabetes mellitus with unspecified diabetic retinopathy without macular edema: Secondary | ICD-10-CM | POA: Insufficient documentation

## 2016-01-18 DIAGNOSIS — Z202 Contact with and (suspected) exposure to infections with a predominantly sexual mode of transmission: Secondary | ICD-10-CM | POA: Insufficient documentation

## 2016-01-18 DIAGNOSIS — E785 Hyperlipidemia, unspecified: Secondary | ICD-10-CM | POA: Insufficient documentation

## 2016-01-18 DIAGNOSIS — I10 Essential (primary) hypertension: Secondary | ICD-10-CM | POA: Insufficient documentation

## 2016-01-18 DIAGNOSIS — J45909 Unspecified asthma, uncomplicated: Secondary | ICD-10-CM | POA: Insufficient documentation

## 2016-01-18 DIAGNOSIS — B3731 Acute candidiasis of vulva and vagina: Secondary | ICD-10-CM

## 2016-01-18 LAB — GLUCOSE, CAPILLARY: Glucose-Capillary: 263 mg/dL — ABNORMAL HIGH (ref 65–99)

## 2016-01-18 NOTE — Telephone Encounter (Signed)
Pt is requesting another letter to keep her out of work 1 more week states that the swelling has not gone down and she is unable to get on her boots for work.

## 2016-01-19 ENCOUNTER — Encounter: Payer: Self-pay | Admitting: Internal Medicine

## 2016-01-19 ENCOUNTER — Encounter (HOSPITAL_COMMUNITY): Payer: Self-pay | Admitting: *Deleted

## 2016-01-19 DIAGNOSIS — B373 Candidiasis of vulva and vagina: Secondary | ICD-10-CM

## 2016-01-19 LAB — WET PREP, GENITAL
Sperm: NONE SEEN
Trich, Wet Prep: NONE SEEN

## 2016-01-19 LAB — URINE MICROSCOPIC-ADD ON

## 2016-01-19 LAB — URINALYSIS, ROUTINE W REFLEX MICROSCOPIC
Bilirubin Urine: NEGATIVE
Glucose, UA: >1000 mg/dL — AB
Ketones, ur: 15 mg/dL — AB
Leukocytes, UA: NEGATIVE
Nitrite: NEGATIVE
SPECIFIC GRAVITY, URINE: 1.02 (ref 1.005–1.030)
pH: 6 (ref 5.0–8.0)

## 2016-01-19 LAB — POCT PREGNANCY, URINE: Preg Test, Ur: NEGATIVE

## 2016-01-19 LAB — RPR: RPR Ser Ql: NONREACTIVE

## 2016-01-19 LAB — HIV ANTIBODY (ROUTINE TESTING W REFLEX): HIV Screen 4th Generation wRfx: NONREACTIVE

## 2016-01-19 MED ORDER — FLUCONAZOLE 150 MG PO TABS: 150.0000 mg | ORAL_TABLET | Freq: Once | ORAL | Status: DC

## 2016-01-19 NOTE — Telephone Encounter (Signed)
Pt calling to check on this letter states she wants to pick up a copy and fax one to [place] and [place] [phone]. Pt states that she also needs one specific letter for the entire time she has been out because she is trying to get food stamps and they will not accept the broken up letters.

## 2016-01-19 NOTE — MAU Provider Note (Signed)
History     CSN: [id number]  Arrival date and time: 01/18/16 2325   First Provider Initiated Contact with Patient 01/19/16 0051      Chief Complaint  Patient presents with  . Exposure to STD   HPI Comments: Jane [name] is a 40 y.o. Who presents today for STD testing. She states that she had intercourse with a new partner last week, and the condom broke. She denies any symptoms at this time.   Exposure to STD  The patient's pertinent negatives include no discharge, dyspareunia, dysuria, genital itching, genital lesions, genital rash or pelvic pain. This is a new problem. The current episode started in the past 7 days. The problem has been unchanged. The vaginal discharge was normal. Pertinent negatives include no abdominal pain, fever or urinary frequency. She has tried nothing for the symptoms.     Past Medical History  Diagnosis Date  . Asthma   . Hyperlipidemia   . Seasonal allergies   . Noncompliance with medications   . Peripheral neuropathy ([place])   . Diabetic retinopathy ([place])   . Iron deficiency anemia   . Menorrhagia   . Hypertension   . Pneumonia "several times"  . Type II diabetes mellitus ([facility])     uncontrolled  . Sickle cell trait ([place])   . Depression   . Heart murmur   . Retinopathy of both eyes     Past Surgical History  Procedure Laterality Date  . Cesarean section  1997  . Tubal ligation  1997    Family History  Problem Relation Age of Onset  . Stroke Mother   . Diabetes Mother   . Hypertension Mother   . Aneurysm Mother   . Diabetes Father   . Hypertension Father   . Diabetes Maternal Grandmother   . Diabetes Maternal Grandfather   . Cancer Paternal Grandfather     Prostate  . Diabetes Sister   . Hypertension Sister     Social History  Substance Use Topics  . Smoking status: Former Smoker -- 0.25 packs/day for 1 years    Types: Cigarettes    Quit date: 08/29/2015  . Smokeless tobacco: Never Used  . Alcohol Use: 0.0 oz/week     0 Standard drinks or equivalent per week     Comment: 12/23/2015 "last alcohol was ~ Christmas 2016; that was very little alcohol"    Allergies: No Known Allergies  Prescriptions prior to admission  Medication Sig Dispense Refill Last Dose  . atorvastatin (LIPITOR) 80 MG tablet Take 1 tablet (80 mg total) by mouth daily. 90 tablet 0 Past Week at Unknown time  . hydrochlorothiazide (MICROZIDE) 12.5 MG capsule Take 1 capsule (12.5 mg total) by mouth daily. 30 capsule 2 01/18/2016 at Unknown time  . insulin aspart protamine- aspart (NOVOLOG MIX 70/30) (70-30) 100 UNIT/ML injection Inject 0.18 mLs (18 Units total) into the skin 2 (two) times daily with a meal. 10 mL 11 01/18/2016 at Unknown time  . lisinopril (PRINIVIL,ZESTRIL) 5 MG tablet Take 2 tablets (10 mg total) by mouth daily. 30 tablet 5 01/18/2016 at Unknown time  . albuterol (PROVENTIL HFA;VENTOLIN HFA) 108 (90 Base) MCG/ACT inhaler Inhale 2 puffs into the lungs every 4 (four) hours as needed for wheezing or shortness of breath (or coughing). 1 Inhaler 2 More than a month at Unknown time  . benzonatate (TESSALON PERLES) 100 MG capsule Take 1 capsule (100 mg total) by mouth 2 (two) times daily as needed for cough. 30 capsule 0  Taking  . clindamycin (CLEOCIN) 300 MG capsule Take 1 capsule (300 mg total) by mouth 3 (three) times daily. 30 capsule 0 Taking  . collagenase (SANTYL) ointment Apply topically daily. 15 g 0 Taking  . ondansetron (ZOFRAN ODT) 4 MG disintegrating tablet Take 1 tablet (4 mg total) by mouth every 8 (eight) hours as needed for nausea or vomiting. 20 tablet 0 More than a month at Unknown time  . ondansetron (ZOFRAN) 4 MG tablet Take 1 tablet (4 mg total) by mouth every 8 (eight) hours as needed for nausea or vomiting. 20 tablet 0 More than a month at Unknown time    Review of Systems  Constitutional: Negative for fever and chills.  Gastrointestinal: Positive for diarrhea. Negative for nausea, vomiting, abdominal pain,  constipation and blood in stool.  Genitourinary: Negative for dysuria, urgency, frequency, pelvic pain and dyspareunia.   Physical Exam   Blood pressure 164/93, pulse 100, temperature 98.4 F (36.9 C), temperature source Oral, resp. rate 16, last menstrual period 01/05/2016, SpO2 100 %.  Physical Exam  Nursing note and vitals reviewed. Constitutional: She is oriented to person, place, and time. She appears well-developed and well-nourished. No distress.  Cardiovascular: Normal rate.   Respiratory: Effort normal.  GI: Soft. There is no tenderness. There is no rebound.  Neurological: She is alert and oriented to person, place, and time.  Skin: Skin is warm and dry.  Psychiatric: She has a normal mood and affect.   Results for orders placed or performed during the hospital encounter of 01/18/16 (from the past 24 hour(s))  Urinalysis, Routine w reflex microscopic (not at [facility])     Status: Abnormal   Collection Time: 01/18/16 11:59 PM  Result Value Ref Range   Color, Urine YELLOW YELLOW   APPearance CLEAR CLEAR   Specific Gravity, Urine 1.020 1.005 - 1.030   pH 6.0 5.0 - 8.0   Glucose, UA >1000 (A) NEGATIVE mg/dL   Hgb urine dipstick MODERATE (A) NEGATIVE   Bilirubin Urine NEGATIVE NEGATIVE   Ketones, ur 15 (A) NEGATIVE mg/dL   Protein, ur >300 (A) NEGATIVE mg/dL   Nitrite NEGATIVE NEGATIVE   Leukocytes, UA NEGATIVE NEGATIVE  Urine microscopic-add on     Status: Abnormal   Collection Time: 01/18/16 11:59 PM  Result Value Ref Range   Squamous Epithelial / LPF 0-5 (A) NONE SEEN   WBC, UA 0-5 0 - 5 WBC/hpf   RBC / HPF 0-5 0 - 5 RBC/hpf   Bacteria, UA FEW (A) NONE SEEN   Casts HYALINE CASTS (A) NEGATIVE  Wet prep, genital     Status: Abnormal   Collection Time: 01/19/16 12:30 AM  Result Value Ref Range   Yeast Wet Prep HPF POC PRESENT (A) NONE SEEN   Trich, Wet Prep NONE SEEN NONE SEEN   Clue Cells Wet Prep HPF POC PRESENT (A) NONE SEEN   WBC, Wet Prep HPF POC FEW (A) NONE SEEN    Sperm NONE SEEN      MAU Course  Procedures  MDM   Assessment and Plan   1. Yeast infection involving the vagina and surrounding area    DC home Comfort measures reviewed  RX: diflucan #2    Follow-up Information    Follow up with [facility].   Why:  They can do STD testing there. All insurances accepted, and low to no fees if no insurance.    Contact information:   [place] [address] [phone]  Mathis Bud 01/19/2016, 1:00 AM

## 2016-01-19 NOTE — Telephone Encounter (Signed)
These were faxed, also tried to call pt for approval of what was written, lm for rtc

## 2016-01-19 NOTE — MAU Note (Signed)
Pt reports she wants to be checked for STD'S because she has a new partner and they had a condom to break. Denies symptoms or complaint.

## 2016-01-19 NOTE — Discharge Instructions (Signed)

## 2016-01-19 NOTE — Telephone Encounter (Signed)
Yes will provide the letter for another week, please call her to let her know, thanks!  Dr. Naaman Plummer

## 2016-01-20 ENCOUNTER — Ambulatory Visit (HOSPITAL_COMMUNITY)
Admission: RE | Admit: 2016-01-20 | Discharge: 2016-01-20 | Disposition: A | Discharge status: Home / Self Care | Payer: BLUE CROSS/BLUE SHIELD | Source: Ambulatory Visit | Attending: Internal Medicine | Admitting: Internal Medicine

## 2016-01-20 VITALS — BP 180/92 | HR 102 | Temp 97.6°F | Resp 20 | Ht 65.0 in | Wt 198.0 lb

## 2016-01-20 DIAGNOSIS — D509 Iron deficiency anemia, unspecified: Secondary | ICD-10-CM

## 2016-01-20 DIAGNOSIS — I509 Heart failure, unspecified: Secondary | ICD-10-CM | POA: Insufficient documentation

## 2016-01-20 LAB — GC/CHLAMYDIA PROBE AMP (~~LOC~~) NOT AT ARMC
Chlamydia: NEGATIVE
Neisseria Gonorrhea: NEGATIVE

## 2016-01-20 MED ORDER — SODIUM CHLORIDE 0.9 % IV SOLN
510.0000 mg | INTRAVENOUS | Status: AC
  Administered 2016-01-20: 510 mg via INTRAVENOUS
  Filled 2016-01-20: qty 17

## 2016-01-25 ENCOUNTER — Encounter: Payer: Self-pay | Admitting: Internal Medicine

## 2016-01-25 ENCOUNTER — Ambulatory Visit (HOSPITAL_COMMUNITY)
Admission: RE | Admit: 2016-01-25 | Discharge: 2016-01-25 | Disposition: A | Discharge status: Home / Self Care | Payer: PRIVATE HEALTH INSURANCE | Source: Ambulatory Visit | Attending: Internal Medicine | Admitting: Internal Medicine

## 2016-01-25 ENCOUNTER — Ambulatory Visit (INDEPENDENT_AMBULATORY_CARE_PROVIDER_SITE_OTHER): Payer: BLUE CROSS/BLUE SHIELD | Admitting: Internal Medicine

## 2016-01-25 VITALS — BP 199/115 | HR 106 | Temp 98.3°F | Wt 196.2 lb

## 2016-01-25 DIAGNOSIS — Z7982 Long term (current) use of aspirin: Secondary | ICD-10-CM

## 2016-01-25 DIAGNOSIS — I10 Essential (primary) hypertension: Secondary | ICD-10-CM

## 2016-01-25 DIAGNOSIS — Z79899 Other long term (current) drug therapy: Secondary | ICD-10-CM

## 2016-01-25 DIAGNOSIS — R9431 Abnormal electrocardiogram [ECG] [EKG]: Secondary | ICD-10-CM | POA: Insufficient documentation

## 2016-01-25 DIAGNOSIS — I2510 Atherosclerotic heart disease of native coronary artery without angina pectoris: Secondary | ICD-10-CM | POA: Diagnosis not present

## 2016-01-25 DIAGNOSIS — R000 Tachycardia, unspecified: Secondary | ICD-10-CM | POA: Insufficient documentation

## 2016-01-25 MED ORDER — LISINOPRIL 20 MG PO TABS: 20.0000 mg | ORAL_TABLET | Freq: Every day | ORAL | Status: DC

## 2016-01-25 MED ORDER — ASPIRIN EC 81 MG PO TBEC: 81.0000 mg | DELAYED_RELEASE_TABLET | Freq: Every day | ORAL | Status: DC

## 2016-01-25 MED ORDER — HYDROCHLOROTHIAZIDE 12.5 MG PO CAPS: 25.0000 mg | ORAL_CAPSULE | Freq: Every day | ORAL | Status: DC

## 2016-01-25 MED ORDER — ACETAMINOPHEN 500 MG PO TABS: 500.0000 mg | ORAL_TABLET | Freq: Four times a day (QID) | ORAL | Status: DC | PRN

## 2016-01-25 NOTE — Patient Instructions (Addendum)
Increased your Lisinopril to 20mg  daily.  Also increased you hydrochlorothiazide to 25mg  daily.  Make sure you take this every day.  If you notice any change in your dizziness, headache, chest pain, or other symptoms please let us know.  Follow up closely for your blood pressure in 1 week.  Gave you letter for work for 1 week.  Will start baby aspirin to protect your heart.

## 2016-01-25 NOTE — Progress Notes (Signed)
   Subjective:    Patient ID: Jane [name], female    DOB: September 07, 1976, 40 y.o.   MRN: [id number]  HPI  40 yo female with hx of HTN, uncontrolled DM II, peripheral neuropathy, PVD, hx of venous stasis ulcers, HLD presents with high BP.  HTN: last visit 180/92 at short stay (for ferraheme injection), 165/88 last office visit. Currently on lisinopril 10mg  daily + hctz 12.5mg  daily. She states she has not been taking her HCTZ 12.8m, but has been taking her lisinopril daily. Has chronic occasional headache, no changes to this. Has chronic blurry vision without acute changes. No numbnes/tingling. Has some nausea but she relates that to taking Lipitor which she started recently. Has occasional dizziness but no acute changes to this either. Has some chest pain for last 4 hours midsternal area without radiation. It's pressure like. No SOB, wheezing.   Patient has been going through lot of stress recently. Has not been able to go back to work due to on going left ankle swelling. Living with her sister in 1 bed room appt. Facing financial difficulties.   Review of Systems  Constitutional: Negative for fever, chills and fatigue.  HENT: Negative for congestion, drooling, sore throat and trouble swallowing.   Eyes: Positive for visual disturbance. Negative for photophobia.  Respiratory: Negative for cough, choking and shortness of breath.   Cardiovascular: Positive for chest pain and leg swelling. Negative for palpitations.  Gastrointestinal: Negative for nausea, abdominal pain, diarrhea and abdominal distention.  Endocrine: Negative for polyphagia and polyuria.  Genitourinary: Negative for dysuria and flank pain.  Musculoskeletal: Negative for back pain and arthralgias.  Neurological: Positive for headaches. Negative for dizziness, seizures, light-headedness and numbness.       Objective:   Physical Exam  Constitutional: She is oriented to person, place, and time.  HENT:  Head: Normocephalic  and atraumatic.  Mouth/Throat: Oropharynx is clear and moist.  Eyes: Conjunctivae and EOM are normal. Pupils are equal, round, and reactive to light. Right eye exhibits no discharge. Left eye exhibits no discharge.  Neck: Normal range of motion. No JVD present.  Cardiovascular: Exam reveals no gallop and no friction rub.   No murmur heard. Tachycardic.   Pulmonary/Chest: Effort normal and breath sounds normal. No respiratory distress. She has no wheezes. She has no rales.  Has tenderness over her mid sternal region that's reproducible with palpation. No skins lesions.   Abdominal: Soft. Bowel sounds are normal. She exhibits no distension. There is no tenderness.  Musculoskeletal: Normal range of motion. She exhibits no edema or tenderness.  Neurological: She is alert and oriented to person, place, and time.     Filed Vitals:   01/25/16 1416  BP: 199/115  Pulse: 106  Temp: 98.3 F (36.8 C)       Assessment & Plan:  See problem based a&p.

## 2016-01-25 NOTE — Assessment & Plan Note (Addendum)
Filed Vitals:   01/25/16 1416  BP: 199/115  Pulse: 106  Temp: 98.3 F (36.8 C)   BP is elevated. Has been taking lisinopril 10mg  daily but not hctz 12.5mg . Has chronic headache and blurry vision from diabetic retinopathy. No acute changes right now to suggest hypertensive urgency or emergency. Had chest pain that was reproducible with palpation. EKG was done anyway which showed no new changes compared to last EKG but did show Qwaves laterally.  - increased lisinopril to 20mg  daily - increased hctz to 25mg  daily  - asked to call us if she has symptoms such as worsening chest pain, headache, n/v, or vision changes. - f/up closely in 1 week for this.

## 2016-01-25 NOTE — Assessment & Plan Note (Addendum)
We obtained EKG for chest pain. It's unchanged from her previous EKG but does have Qwaves on lateral leads. Has tenderness on chest wall. Current chest pain is likely 2/2 to MSK etiology.This likely means she has CAD (has PVD too). Not on asa or bblocker. Is on Lipitor 80mg  daily. - consider bblocker in the future.  - asked to cont lipitor. - added asa 81mg  daily. - tylenol prn for MSK pain (not doing NSAID since starting asa and also since she has high BP).

## 2016-01-28 DIAGNOSIS — Z0271 Encounter for disability determination: Secondary | ICD-10-CM

## 2016-01-29 ENCOUNTER — Telehealth: Payer: Self-pay | Admitting: Internal Medicine

## 2016-01-29 NOTE — Telephone Encounter (Signed)
APPT. REMINDER CALL, LMTCB °

## 2016-01-29 NOTE — Progress Notes (Signed)
Internal Medicine Clinic Attending  Case discussed with Dr. Ahmed at the time of the visit.  We reviewed the resident's history and exam and pertinent patient test results.  I agree with the assessment, diagnosis, and plan of care documented in the resident's note. 

## 2016-02-01 ENCOUNTER — Encounter: Payer: Self-pay | Admitting: Vascular Surgery

## 2016-02-01 ENCOUNTER — Encounter: Payer: BLUE CROSS/BLUE SHIELD | Admitting: Internal Medicine

## 2016-02-02 ENCOUNTER — Encounter: Payer: Self-pay | Admitting: Internal Medicine

## 2016-02-02 ENCOUNTER — Ambulatory Visit (INDEPENDENT_AMBULATORY_CARE_PROVIDER_SITE_OTHER): Payer: BLUE CROSS/BLUE SHIELD | Admitting: Internal Medicine

## 2016-02-02 VITALS — BP 171/103 | HR 94 | Temp 98.0°F | Wt 194.8 lb

## 2016-02-02 DIAGNOSIS — Z7982 Long term (current) use of aspirin: Secondary | ICD-10-CM | POA: Diagnosis not present

## 2016-02-02 DIAGNOSIS — I2510 Atherosclerotic heart disease of native coronary artery without angina pectoris: Secondary | ICD-10-CM

## 2016-02-02 DIAGNOSIS — E113399 Type 2 diabetes mellitus with moderate nonproliferative diabetic retinopathy without macular edema, unspecified eye: Secondary | ICD-10-CM

## 2016-02-02 DIAGNOSIS — Z79899 Other long term (current) drug therapy: Secondary | ICD-10-CM | POA: Diagnosis not present

## 2016-02-02 DIAGNOSIS — Z8679 Personal history of other diseases of the circulatory system: Secondary | ICD-10-CM | POA: Diagnosis not present

## 2016-02-02 DIAGNOSIS — E1165 Type 2 diabetes mellitus with hyperglycemia: Secondary | ICD-10-CM

## 2016-02-02 DIAGNOSIS — I10 Essential (primary) hypertension: Secondary | ICD-10-CM | POA: Diagnosis not present

## 2016-02-02 LAB — GLUCOSE, CAPILLARY: Glucose-Capillary: 212 mg/dL — ABNORMAL HIGH (ref 65–99)

## 2016-02-02 MED ORDER — METOPROLOL SUCCINATE ER 50 MG PO TB24: 50.0000 mg | ORAL_TABLET | Freq: Every day | ORAL | Status: DC

## 2016-02-02 NOTE — Progress Notes (Signed)
Subjective:   Patient ID: Jane [name] female   DOB: Feb 23, 1976 40 y.o.   MRN: [id number]  HPI: [name] is a 40 y.o. female w/ PMHx of HTN, HLD, DM type II, anemia, and depression, presents to the clinic today for a follow-up visit regarding her blood pressure. Patient was seen 1 week ago at which time her BP was in the 99991111 systolic. Her ACEI was increased and HCTZ added, 25 mg daily. Since that time she has been doing well, but thinks that her leg pain sometimes contributes to her blood pressure being elevated. SBP 170 today. No further headaches or chest pain. EKG also performed at last visit, shows a Q-wave in lead I, which has been present for some time. However, given the presence of PAD, some question as to whether patient has CAD. Started on ASA 81 mg daily at previous visit as well.   Patient is continuing to see wound care for her left leg which has lymphedema.   Past Medical History  Diagnosis Date  . Asthma   . Hyperlipidemia   . Seasonal allergies   . Noncompliance with medications   . Peripheral neuropathy ([place])   . Diabetic retinopathy ([place])   . Iron deficiency anemia   . Menorrhagia   . Hypertension   . Pneumonia "several times"  . Type II diabetes mellitus ([facility])     uncontrolled  . Sickle cell trait ([place])   . Depression   . Heart murmur   . Retinopathy of both eyes    Current Outpatient Prescriptions  Medication Sig Dispense Refill  . acetaminophen (TYLENOL) 500 MG tablet Take 1 tablet (500 mg total) by mouth every 6 (six) hours as needed for headache. 30 tablet 0  . albuterol (PROVENTIL HFA;VENTOLIN HFA) 108 (90 Base) MCG/ACT inhaler Inhale 2 puffs into the lungs every 4 (four) hours as needed for wheezing or shortness of breath (or coughing). 1 Inhaler 2  . aspirin EC 81 MG tablet Take 1 tablet (81 mg total) by mouth daily. 150 tablet 2  . atorvastatin (LIPITOR) 80 MG tablet Take 1 tablet (80 mg total) by mouth daily. 90 tablet 0  .  fluconazole (DIFLUCAN) 150 MG tablet Take 1 tablet (150 mg total) by mouth once. May repeat in 2 days if still having symptoms. 2 tablet 0  . hydrochlorothiazide (MICROZIDE) 12.5 MG capsule Take 2 capsules (25 mg total) by mouth daily. 60 capsule 2  . insulin aspart protamine- aspart (NOVOLOG MIX 70/30) (70-30) 100 UNIT/ML injection Inject 0.18 mLs (18 Units total) into the skin 2 (two) times daily with a meal. 10 mL 11  . lisinopril (PRINIVIL,ZESTRIL) 20 MG tablet Take 1 tablet (20 mg total) by mouth daily. 30 tablet 1  . ondansetron (ZOFRAN ODT) 4 MG disintegrating tablet Take 1 tablet (4 mg total) by mouth every 8 (eight) hours as needed for nausea or vomiting. 20 tablet 0  . ondansetron (ZOFRAN) 4 MG tablet Take 1 tablet (4 mg total) by mouth every 8 (eight) hours as needed for nausea or vomiting. 20 tablet 0   No current facility-administered medications for this visit.    Review of Systems: General: Denies fever, chills, diaphoresis, appetite change and fatigue.  Respiratory: Denies SOB, DOE, cough, and wheezing.   Cardiovascular: Denies chest pain and palpitations.  Gastrointestinal: Denies nausea, vomiting, abdominal pain, and diarrhea.  Genitourinary: Denies dysuria, increased frequency, and flank pain. Endocrine: Denies hot or cold intolerance, polyuria, and polydipsia. Musculoskeletal: Positive for  left leg discomfort. Denies back pain, joint swelling, and gait problem.  Skin: Denies pallor, rash and wounds.  Neurological: Denies dizziness, seizures, syncope, weakness, lightheadedness, numbness and headaches.  Psychiatric/Behavioral: Denies mood changes, and sleep disturbances.  Objective:   Physical Exam: Filed Vitals:   02/02/16 0925  BP: 171/103  Pulse: 94  Temp: 98 F (36.7 C)  TempSrc: Oral  Weight: 194 lb 12.8 oz (88.361 kg)  SpO2: 100%    General: Alert, cooperative, NAD. HEENT: PERRL, EOMI. Moist mucus membranes Neck: Full range of motion without pain, supple,  no lymphadenopathy or carotid bruits Lungs: Clear to ascultation bilaterally, normal work of respiration, no wheezes, rales, rhonchi Heart: RRR, no murmurs, gallops, or rubs Abdomen: Soft, non-tender, non-distended, BS + Extremities: No cyanosis, clubbing. Left leg with boot, and tight wrap. +3 pitting/non-pitting edema only in the left leg.  Neurologic: Alert & oriented X3, cranial nerves II-XII intact, strength grossly intact, sensation intact to light touch   Assessment & Plan:   Please see problem based assessment and plan

## 2016-02-02 NOTE — Patient Instructions (Signed)
1. Please return in 2 weeks for blood pressure check.   2. Please take all medications as previously prescribed with the following changes:  Start taking Toprol-XL 50 mg daily. This will help with your blood pressure.   3. If you have worsening of your symptoms or new symptoms arise, please call the clinic [phone]), or go to the ER immediately if symptoms are severe.  You have done a great job in taking all your medications. Please continue to do this.

## 2016-02-02 NOTE — Assessment & Plan Note (Addendum)
Patient with h/o Q-wave in lead I and atypical chest pain in the past. Also w/ h/o PAD, therefore recent question of CAD. Will need referral to cardiology in the near future for outpatient stress test.  -Continue ASA, Lipitor -Add Toprol-XL 50 mg daily -RTC in 2 weeks

## 2016-02-02 NOTE — Assessment & Plan Note (Signed)
BP Readings from Last 3 Encounters:  02/02/16 171/103  01/25/16 199/115  01/20/16 180/92    Lab Results  Component Value Date   NA 138 01/03/2016   K 3.9 01/03/2016   CREATININE 1.21* 01/03/2016    Assessment: Blood pressure control:  Elevated Comments: Taking Lisinopril 20 daily + HCTZ 25 mg daily. Some improvement in BP, although still quite high. CKD. Some question of CAD given Q-wave in lead I and h/o PAD.   Plan: Medications:  continue current medications. Add Toprol-XL 50 mg daily. Other plans: Can also potentially increase ACEI to max dose as well at next visit. Would check BMP at that time. CCB may worsen LLE swelling. RTC in 2 weeks.

## 2016-02-04 NOTE — Progress Notes (Signed)
Internal Medicine Clinic Attending  Case discussed with Dr. Jones soon after the resident saw the patient.  We reviewed the resident's history and exam and pertinent patient test results.  I agree with the assessment, diagnosis, and plan of care documented in the resident's note. 

## 2016-02-05 ENCOUNTER — Encounter: Payer: Self-pay | Admitting: Vascular Surgery

## 2016-02-05 ENCOUNTER — Inpatient Hospital Stay (HOSPITAL_COMMUNITY)
Admission: AD | Admit: 2016-02-05 | Discharge: 2016-02-05 | Disposition: A | Discharge status: Home / Self Care | Payer: BLUE CROSS/BLUE SHIELD | Source: Ambulatory Visit | Attending: Obstetrics and Gynecology | Admitting: Obstetrics and Gynecology

## 2016-02-05 ENCOUNTER — Ambulatory Visit: Payer: Self-pay | Admitting: Vascular Surgery

## 2016-02-05 ENCOUNTER — Ambulatory Visit (INDEPENDENT_AMBULATORY_CARE_PROVIDER_SITE_OTHER): Payer: BLUE CROSS/BLUE SHIELD | Admitting: Vascular Surgery

## 2016-02-05 ENCOUNTER — Encounter (HOSPITAL_COMMUNITY): Payer: Self-pay | Admitting: *Deleted

## 2016-02-05 VITALS — BP 106/67 | HR 83 | Temp 98.0°F | Resp 18 | Ht 65.0 in | Wt 192.0 lb

## 2016-02-05 DIAGNOSIS — D573 Sickle-cell trait: Secondary | ICD-10-CM | POA: Insufficient documentation

## 2016-02-05 DIAGNOSIS — I83023 Varicose veins of left lower extremity with ulcer of ankle: Secondary | ICD-10-CM | POA: Diagnosis not present

## 2016-02-05 DIAGNOSIS — E11319 Type 2 diabetes mellitus with unspecified diabetic retinopathy without macular edema: Secondary | ICD-10-CM | POA: Insufficient documentation

## 2016-02-05 DIAGNOSIS — D509 Iron deficiency anemia, unspecified: Secondary | ICD-10-CM | POA: Insufficient documentation

## 2016-02-05 DIAGNOSIS — I10 Essential (primary) hypertension: Secondary | ICD-10-CM | POA: Insufficient documentation

## 2016-02-05 DIAGNOSIS — Z87891 Personal history of nicotine dependence: Secondary | ICD-10-CM | POA: Insufficient documentation

## 2016-02-05 DIAGNOSIS — N939 Abnormal uterine and vaginal bleeding, unspecified: Secondary | ICD-10-CM

## 2016-02-05 DIAGNOSIS — Z7982 Long term (current) use of aspirin: Secondary | ICD-10-CM | POA: Insufficient documentation

## 2016-02-05 DIAGNOSIS — L97329 Non-pressure chronic ulcer of left ankle with unspecified severity: Principal | ICD-10-CM

## 2016-02-05 LAB — URINALYSIS, ROUTINE W REFLEX MICROSCOPIC
BILIRUBIN URINE: NEGATIVE
GLUCOSE, UA: 250 mg/dL — AB
KETONES UR: 15 mg/dL — AB
Leukocytes, UA: NEGATIVE
Nitrite: NEGATIVE
PH: 6 (ref 5.0–8.0)
Protein, ur: >300 mg/dL — AB
Specific Gravity, Urine: >1.03 — ABNORMAL HIGH (ref 1.005–1.030)

## 2016-02-05 LAB — CBC
HCT: 29.2 % — ABNORMAL LOW (ref 36.0–46.0)
HEMOGLOBIN: 9.1 g/dL — AB (ref 12.0–15.0)
MCH: 23.2 pg — AB (ref 26.0–34.0)
MCHC: 31.2 g/dL (ref 30.0–36.0)
MCV: 74.5 fL — ABNORMAL LOW (ref 78.0–100.0)
PLATELETS: 238 10*3/uL (ref 150–400)
RBC: 3.92 MIL/uL (ref 3.87–5.11)
RDW: 22.6 % — ABNORMAL HIGH (ref 11.5–15.5)
WBC: 7.3 10*3/uL (ref 4.0–10.5)

## 2016-02-05 LAB — WET PREP, GENITAL
Clue Cells Wet Prep HPF POC: NONE SEEN
SPERM: NONE SEEN
Trich, Wet Prep: NONE SEEN
YEAST WET PREP: NONE SEEN

## 2016-02-05 LAB — URINE MICROSCOPIC-ADD ON

## 2016-02-05 LAB — POCT PREGNANCY, URINE: Preg Test, Ur: NEGATIVE

## 2016-02-05 MED ORDER — IBUPROFEN 800 MG PO TABS: 800.0000 mg | ORAL_TABLET | Freq: Three times a day (TID) | ORAL | Status: DC

## 2016-02-05 MED ORDER — KETOROLAC TROMETHAMINE 60 MG/2ML IM SOLN
60.0000 mg | Freq: Once | INTRAMUSCULAR | Status: DC
Start: 2016-02-05 — End: 2016-02-05

## 2016-02-05 NOTE — Progress Notes (Signed)
Patient name: Jane [name] MRN: [id number] DOB: May 12, 1976 Sex: female  REASON FOR VISIT: Follow-up  HPI: Jane [name] is a 40 y.o. female who presents for one month follow-up of her left foot wound. The patient has left lower extremity chronic venous insufficiency in addition to lymphedema of the left foot and moderate left peripheral arterial disease. Her arteries were noncompressible on ABIs.  She was advised to go to the wound center for management. She has been getting Profore wraps to her legs weekly. She states that her wound is improving.    Past Medical History  Diagnosis Date  . Asthma   . Hyperlipidemia   . Seasonal allergies   . Noncompliance with medications   . Peripheral neuropathy ([place])   . Diabetic retinopathy ([place])   . Iron deficiency anemia   . Menorrhagia   . Hypertension   . Pneumonia "several times"  . Type II diabetes mellitus (HCC)     uncontrolled  . Sickle cell trait ([place])   . Depression   . Heart murmur   . Retinopathy of both eyes     Family History  Problem Relation Age of Onset  . Stroke Mother   . Diabetes Mother   . Hypertension Mother   . Aneurysm Mother   . Diabetes Father   . Hypertension Father   . Diabetes Maternal Grandmother   . Diabetes Maternal Grandfather   . Cancer Paternal Grandfather     Prostate  . Diabetes Sister   . Hypertension Sister     SOCIAL HISTORY: Social History  Substance Use Topics  . Smoking status: Former Smoker -- 0.25 packs/day for 1 years    Types: Cigarettes    Quit date: 08/29/2015  . Smokeless tobacco: Never Used  . Alcohol Use: 0.0 oz/week    0 Standard drinks or equivalent per week     Comment: 12/23/2015 "last alcohol was ~ Christmas 2016; that was very little alcohol"    No Known Allergies  Current Outpatient Prescriptions  Medication Sig Dispense Refill  . acetaminophen (TYLENOL) 500 MG tablet Take 1 tablet (500 mg total) by mouth every 6 (six) hours as needed for  headache. 30 tablet 0  . albuterol (PROVENTIL HFA;VENTOLIN HFA) 108 (90 Base) MCG/ACT inhaler Inhale 2 puffs into the lungs every 4 (four) hours as needed for wheezing or shortness of breath (or coughing). 1 Inhaler 2  . aspirin EC 81 MG tablet Take 1 tablet (81 mg total) by mouth daily. 150 tablet 2  . atorvastatin (LIPITOR) 80 MG tablet Take 1 tablet (80 mg total) by mouth daily. 90 tablet 0  . fluconazole (DIFLUCAN) 150 MG tablet Take 1 tablet (150 mg total) by mouth once. May repeat in 2 days if still having symptoms. 2 tablet 0  . hydrochlorothiazide (MICROZIDE) 12.5 MG capsule Take 2 capsules (25 mg total) by mouth daily. 60 capsule 2  . ibuprofen (ADVIL,MOTRIN) 800 MG tablet Take 1 tablet (800 mg total) by mouth every 8 (eight) hours. 30 tablet 0  . insulin aspart protamine- aspart (NOVOLOG MIX 70/30) (70-30) 100 UNIT/ML injection Inject 0.18 mLs (18 Units total) into the skin 2 (two) times daily with a meal. 10 mL 11  . lisinopril (PRINIVIL,ZESTRIL) 20 MG tablet Take 1 tablet (20 mg total) by mouth daily. 30 tablet 1  . metoprolol succinate (TOPROL XL) 50 MG 24 hr tablet Take 1 tablet (50 mg total) by mouth daily. Take with or immediately following a meal. 30 tablet  5  . ondansetron (ZOFRAN ODT) 4 MG disintegrating tablet Take 1 tablet (4 mg total) by mouth every 8 (eight) hours as needed for nausea or vomiting. 20 tablet 0  . ondansetron (ZOFRAN) 4 MG tablet Take 1 tablet (4 mg total) by mouth every 8 (eight) hours as needed for nausea or vomiting. 20 tablet 0   No current facility-administered medications for this visit.    REVIEW OF SYSTEMS:  [X]  denotes positive finding, [ ]  denotes negative finding Cardiac  Comments:  Chest pain or chest pressure: x   Shortness of breath upon exertion: x   Short of breath when lying flat: x   Irregular heart rhythm:        Vascular    Pain in calf, thigh, or hip brought on by ambulation:    Pain in feet at night that wakes you up from your  sleep:     Blood clot in your veins:    Leg swelling:  x       Pulmonary    Oxygen at home:    Productive cough:     Wheezing:         Neurologic    Sudden weakness in arms or legs:     Sudden numbness in arms or legs:     Sudden onset of difficulty speaking or slurred speech:    Temporary loss of vision in one eye:     Problems with dizziness:  x       Gastrointestinal    Blood in stool:     Vomited blood:         Genitourinary    Burning when urinating:     Blood in urine:        Psychiatric    Major depression:  x       Hematologic    Bleeding problems:    Problems with blood clotting too easily:        Skin    Rashes or ulcers:        Constitutional    Fever or chills:      PHYSICAL EXAM: Filed Vitals:   02/05/16 1304  BP: 106/67  Pulse: 83  Temp: 98 F (36.7 C)  TempSrc: Oral  Resp: 18  Height: 5\' 5"  (1.651 m)  Weight: 192 lb (87.091 kg)  SpO2: 100%    GENERAL: The patient is a well-nourished female, in no acute distress. The vital signs are documented above. VASCULAR: Significant swelling left foot consistent with lymphedema PULMONARY: Nonlabored respiratory effort.  MUSCULOSKELETAL: Wound on dorsum of left foot is nearly healed. NEUROLOGIC: No focal weakness or paresthesias are detected. PSYCHIATRIC: The patient has a normal affect.   MEDICAL ISSUES: LLE leg chronic venous insufficiency (C6), Venous stasis ulcer, possible lymphedema, RLE minimal PAD, LLE moderate PAD  The patient's left foot wound has nearly healed after 1 month of compression therapy at the wound center. We'll refer the patient to a lymphedema clinic for treatment of her left foot swelling. Discussed the importance of diabetes management and swelling management to prevent future skin and foot infections. The patient will follow-up on an as-needed basis.  Virgina Jock, PA-C [facility]    Addendum  I have independently interviewed and  examined the patient, and I agree with the physician assistant's findings.  Left ankle wound has essentially resolved.  Left foot remains distorted, suggesting she has lymphedema.  Will refer her to Lymphedema clinic for lymphatic massage and compression.  She is going to need lifetime 30-40 mm Hg compression.  Another prior will be optimization of her uncontrolled diabetes.  This patient can follow up with [place] as needed.  I think long-term management of this patient's PAD is going to difficult given her medical non-compliance.  Adele Barthel, MD [facility] Office: [phone] Pager: [phone]  02/05/2016, 1:34 PM

## 2016-02-05 NOTE — MAU Note (Signed)
Pt reports she passed four "silver dollar size blood clots", lower back pain.

## 2016-02-05 NOTE — MAU Provider Note (Signed)
History     CSN: [id number]  Arrival date and time: 02/05/16 [id number]   First Provider Initiated Contact with Patient 02/05/16 [phone]      Chief Complaint  Patient presents with  . Vaginal Bleeding   Vaginal Bleeding The patient's primary symptoms include vaginal bleeding. This is a new problem. The current episode started in the past 7 days. The problem occurs constantly. The problem has been waxing and waning. Pain severity now: 8/10  The problem affects both sides. Associated symptoms include abdominal pain. Pertinent negatives include no chills, constipation, diarrhea, dysuria, fever, frequency, nausea, urgency or vomiting. The vaginal discharge was bloody. The vaginal bleeding is typical of menses. She has been passing clots (4 clots, one about the size of a tennis ball, and then abotu the size of a silver dollar. ). She has not been passing tissue. Nothing aggravates the symptoms. She has tried nothing for the symptoms. She uses nothing for contraception. Menstrual history: LMP 01/30/16. States that periods are usually long and heavy.     Past Medical History  Diagnosis Date  . Asthma   . Hyperlipidemia   . Seasonal allergies   . Noncompliance with medications   . Peripheral neuropathy ([place])   . Diabetic retinopathy ([place])   . Iron deficiency anemia   . Menorrhagia   . Hypertension   . Pneumonia "several times"  . Type II diabetes mellitus (HCC)     uncontrolled  . Sickle cell trait ([place])   . Depression   . Heart murmur   . Retinopathy of both eyes     Past Surgical History  Procedure Laterality Date  . Cesarean section  1997  . Tubal ligation  1997    Family History  Problem Relation Age of Onset  . Stroke Mother   . Diabetes Mother   . Hypertension Mother   . Aneurysm Mother   . Diabetes Father   . Hypertension Father   . Diabetes Maternal Grandmother   . Diabetes Maternal Grandfather   . Cancer Paternal Grandfather     Prostate  . Diabetes Sister   .  Hypertension Sister     Social History  Substance Use Topics  . Smoking status: Former Smoker -- 0.25 packs/day for 1 years    Types: Cigarettes    Quit date: 08/29/2015  . Smokeless tobacco: Never Used  . Alcohol Use: 0.0 oz/week    0 Standard drinks or equivalent per week     Comment: 12/23/2015 "last alcohol was ~ Christmas 2016; that was very little alcohol"    Allergies: No Known Allergies  Prescriptions prior to admission  Medication Sig Dispense Refill Last Dose  . acetaminophen (TYLENOL) 500 MG tablet Take 1 tablet (500 mg total) by mouth every 6 (six) hours as needed for headache. 30 tablet 0   . albuterol (PROVENTIL HFA;VENTOLIN HFA) 108 (90 Base) MCG/ACT inhaler Inhale 2 puffs into the lungs every 4 (four) hours as needed for wheezing or shortness of breath (or coughing). 1 Inhaler 2 More than a month at Unknown time  . aspirin EC 81 MG tablet Take 1 tablet (81 mg total) by mouth daily. 150 tablet 2   . atorvastatin (LIPITOR) 80 MG tablet Take 1 tablet (80 mg total) by mouth daily. 90 tablet 0 Past Week at Unknown time  . fluconazole (DIFLUCAN) 150 MG tablet Take 1 tablet (150 mg total) by mouth once. May repeat in 2 days if still having symptoms. 2 tablet 0   . hydrochlorothiazide (  MICROZIDE) 12.5 MG capsule Take 2 capsules (25 mg total) by mouth daily. 60 capsule 2   . insulin aspart protamine- aspart (NOVOLOG MIX 70/30) (70-30) 100 UNIT/ML injection Inject 0.18 mLs (18 Units total) into the skin 2 (two) times daily with a meal. 10 mL 11 01/18/2016 at Unknown time  . lisinopril (PRINIVIL,ZESTRIL) 20 MG tablet Take 1 tablet (20 mg total) by mouth daily. 30 tablet 1   . metoprolol succinate (TOPROL XL) 50 MG 24 hr tablet Take 1 tablet (50 mg total) by mouth daily. Take with or immediately following a meal. 30 tablet 5   . ondansetron (ZOFRAN ODT) 4 MG disintegrating tablet Take 1 tablet (4 mg total) by mouth every 8 (eight) hours as needed for nausea or vomiting. 20 tablet 0 More  than a month at Unknown time  . ondansetron (ZOFRAN) 4 MG tablet Take 1 tablet (4 mg total) by mouth every 8 (eight) hours as needed for nausea or vomiting. 20 tablet 0 More than a month at Unknown time    Review of Systems  Constitutional: Negative for fever and chills.  Gastrointestinal: Positive for abdominal pain. Negative for nausea, vomiting, diarrhea and constipation.  Genitourinary: Positive for vaginal bleeding. Negative for dysuria, urgency and frequency.  Neurological: Positive for dizziness.   Physical Exam   Last menstrual period 01/02/2016.  Physical Exam  Nursing note and vitals reviewed. Constitutional: She is oriented to person, place, and time. She appears well-developed and well-nourished. No distress.  HENT:  Head: Normocephalic.  Cardiovascular: Normal rate.   Respiratory: Effort normal.  GI: Soft. There is no tenderness. There is no rebound.  Neurological: She is alert and oriented to person, place, and time.  Skin: Skin is warm and dry.   Results for orders placed or performed during the hospital encounter of 02/05/16 (from the past 24 hour(s))  CBC     Status: Abnormal   Collection Time: 02/05/16  4:29 AM  Result Value Ref Range   WBC 7.3 4.0 - 10.5 K/uL   RBC 3.92 3.87 - 5.11 MIL/uL   Hemoglobin 9.1 (L) 12.0 - 15.0 g/dL   HCT 29.2 (L) 36.0 - 46.0 %   MCV 74.5 (L) 78.0 - 100.0 fL   MCH 23.2 (L) 26.0 - 34.0 pg   MCHC 31.2 30.0 - 36.0 g/dL   RDW 22.6 (H) 11.5 - 15.5 %   Platelets 238 150 - 400 K/uL  Pregnancy, urine POC     Status: None   Collection Time: 02/05/16  4:59 AM  Result Value Ref Range   Preg Test, Ur NEGATIVE NEGATIVE    MAU Course  Procedures  MDM   Assessment and Plan   1. Abnormal uterine bleeding    DC home Comfort measures reviewed  Bleeding precautions RX: ibuprofen 800mg  Q 8 hours  Return to MAU as needed Order in for Pelvic US complete to be done prior to clinic appointment.   Follow-up Information    Follow up  with [facility].   Specialty:  Obstetrics and Gynecology   Why:  They will call you with an appointment   Contact information:   [place] [phone]        Mathis Bud 02/05/2016, 5:02 AM

## 2016-02-05 NOTE — Discharge Instructions (Signed)

## 2016-02-08 LAB — GC/CHLAMYDIA PROBE AMP (~~LOC~~) NOT AT ARMC
CHLAMYDIA, DNA PROBE: NEGATIVE
NEISSERIA GONORRHEA: NEGATIVE

## 2016-02-15 ENCOUNTER — Encounter (HOSPITAL_BASED_OUTPATIENT_CLINIC_OR_DEPARTMENT_OTHER): Payer: BLUE CROSS/BLUE SHIELD | Attending: Internal Medicine

## 2016-02-15 DIAGNOSIS — E1142 Type 2 diabetes mellitus with diabetic polyneuropathy: Secondary | ICD-10-CM | POA: Insufficient documentation

## 2016-02-15 DIAGNOSIS — E1151 Type 2 diabetes mellitus with diabetic peripheral angiopathy without gangrene: Secondary | ICD-10-CM | POA: Insufficient documentation

## 2016-02-15 DIAGNOSIS — E11622 Type 2 diabetes mellitus with other skin ulcer: Secondary | ICD-10-CM | POA: Insufficient documentation

## 2016-02-15 DIAGNOSIS — I10 Essential (primary) hypertension: Secondary | ICD-10-CM | POA: Insufficient documentation

## 2016-02-15 DIAGNOSIS — D571 Sickle-cell disease without crisis: Secondary | ICD-10-CM | POA: Insufficient documentation

## 2016-02-15 DIAGNOSIS — L97821 Non-pressure chronic ulcer of other part of left lower leg limited to breakdown of skin: Secondary | ICD-10-CM | POA: Insufficient documentation

## 2016-02-15 DIAGNOSIS — I890 Lymphedema, not elsewhere classified: Secondary | ICD-10-CM | POA: Insufficient documentation

## 2016-02-16 ENCOUNTER — Telehealth: Payer: Self-pay | Admitting: Internal Medicine

## 2016-02-16 NOTE — Telephone Encounter (Signed)
APPT. REMINDER CALL, LMTCB °

## 2016-02-17 ENCOUNTER — Ambulatory Visit: Payer: Self-pay | Admitting: Internal Medicine

## 2016-02-18 ENCOUNTER — Encounter: Payer: Self-pay | Admitting: Internal Medicine

## 2016-02-18 ENCOUNTER — Ambulatory Visit (INDEPENDENT_AMBULATORY_CARE_PROVIDER_SITE_OTHER): Payer: BLUE CROSS/BLUE SHIELD | Admitting: Internal Medicine

## 2016-02-18 ENCOUNTER — Encounter: Payer: Self-pay | Admitting: *Deleted

## 2016-02-18 VITALS — BP 171/93 | HR 86 | Temp 98.3°F | Wt 198.1 lb

## 2016-02-18 DIAGNOSIS — Z9114 Patient's other noncompliance with medication regimen: Secondary | ICD-10-CM | POA: Diagnosis not present

## 2016-02-18 DIAGNOSIS — R451 Restlessness and agitation: Secondary | ICD-10-CM

## 2016-02-18 DIAGNOSIS — R600 Localized edema: Secondary | ICD-10-CM | POA: Diagnosis not present

## 2016-02-18 DIAGNOSIS — I10 Essential (primary) hypertension: Secondary | ICD-10-CM | POA: Diagnosis not present

## 2016-02-18 MED ORDER — HYDROCHLOROTHIAZIDE 25 MG PO TABS: 25.0000 mg | ORAL_TABLET | Freq: Every day | ORAL | Status: DC

## 2016-02-18 MED ORDER — LISINOPRIL 20 MG PO TABS: 20.0000 mg | ORAL_TABLET | Freq: Every day | ORAL | Status: DC

## 2016-02-18 NOTE — Addendum Note (Signed)
Addended by: Collier Salina on: 02/18/2016 11:50 AM   Modules accepted: Miquel Dunn

## 2016-02-18 NOTE — Patient Instructions (Addendum)
I have refilled your prescriptions at [facility] with doses that should reduce the amount of pills needed per day. Please let us know if you have difficulty with obtaining refills for any reason. You should take the lisinopril 20mg  once daily and hydrochlorothiazide(HCTZ) 25mg  once daily.  Your blood pressure was high today most likely from being out of your medicines, and possibly from leg pain and treatment of this pain with ibuprofen.  Your leg pain may benefit from some alternative therapies involving the nerves of the leg after past injury. Because neurology access has been limited I will recommend you be evaluated by your PCP for treatment if appropriate.

## 2016-02-18 NOTE — Progress Notes (Addendum)
Patient ID: Jane [name], female   DOB: 1976/02/20, 40 y.o.   MRN: [id number]   Subjective:   Patient ID: Jane [name] female    DOB: 1975-11-28 40 y.o.    MRN: [id number] Health Maintenance Due: Health Maintenance Due  Topic Date Due  . PAP SMEAR  01/10/1996    _________________________________________________  HPI: Jane [name] is a 40 y.o. female here for a BP f/u visit.  Pt has a PMH outlined below.  Please see problem-based charting assessment and plan for further status of patient's chronic medical problems addressed at today's visit.  PMH: Past Medical History  Diagnosis Date  . Asthma   . Hyperlipidemia   . Seasonal allergies   . Noncompliance with medications   . Peripheral neuropathy ([place])   . Diabetic retinopathy ([place])   . Iron deficiency anemia   . Menorrhagia   . Hypertension   . Pneumonia "several times"  . Type II diabetes mellitus (HCC)     uncontrolled  . Sickle cell trait ([place])   . Depression   . Heart murmur   . Retinopathy of both eyes     Medications: Current Outpatient Prescriptions on File Prior to Visit  Medication Sig Dispense Refill  . acetaminophen (TYLENOL) 500 MG tablet Take 1 tablet (500 mg total) by mouth every 6 (six) hours as needed for headache. 30 tablet 0  . albuterol (PROVENTIL HFA;VENTOLIN HFA) 108 (90 Base) MCG/ACT inhaler Inhale 2 puffs into the lungs every 4 (four) hours as needed for wheezing or shortness of breath (or coughing). 1 Inhaler 2  . aspirin EC 81 MG tablet Take 1 tablet (81 mg total) by mouth daily. 150 tablet 2  . atorvastatin (LIPITOR) 80 MG tablet Take 1 tablet (80 mg total) by mouth daily. 90 tablet 0  . fluconazole (DIFLUCAN) 150 MG tablet Take 1 tablet (150 mg total) by mouth once. May repeat in 2 days if still having symptoms. 2 tablet 0  . hydrochlorothiazide (MICROZIDE) 12.5 MG capsule Take 2 capsules (25 mg total) by mouth daily. 60 capsule 2  . ibuprofen (ADVIL,MOTRIN) 800 MG tablet  Take 1 tablet (800 mg total) by mouth every 8 (eight) hours. 30 tablet 0  . insulin aspart protamine- aspart (NOVOLOG MIX 70/30) (70-30) 100 UNIT/ML injection Inject 0.18 mLs (18 Units total) into the skin 2 (two) times daily with a meal. 10 mL 11  . lisinopril (PRINIVIL,ZESTRIL) 20 MG tablet Take 1 tablet (20 mg total) by mouth daily. 30 tablet 1  . metoprolol succinate (TOPROL XL) 50 MG 24 hr tablet Take 1 tablet (50 mg total) by mouth daily. Take with or immediately following a meal. 30 tablet 5  . ondansetron (ZOFRAN ODT) 4 MG disintegrating tablet Take 1 tablet (4 mg total) by mouth every 8 (eight) hours as needed for nausea or vomiting. 20 tablet 0  . ondansetron (ZOFRAN) 4 MG tablet Take 1 tablet (4 mg total) by mouth every 8 (eight) hours as needed for nausea or vomiting. 20 tablet 0   No current facility-administered medications on file prior to visit.    Allergies: No Known Allergies  FH: Family History  Problem Relation Age of Onset  . Stroke Mother   . Diabetes Mother   . Hypertension Mother   . Aneurysm Mother   . Diabetes Father   . Hypertension Father   . Diabetes Maternal Grandmother   . Diabetes Maternal Grandfather   . Cancer Paternal Grandfather     Prostate  .  Diabetes Sister   . Hypertension Sister     SH: Social History   Social History  . Marital Status: Single    Spouse Name: N/A  . Number of Children: 2  . Years of Education: 13   Occupational History  .   [employer]    Social History Main Topics  . Smoking status: Former Smoker -- 0.25 packs/day for 1 years    Types: Cigarettes    Quit date: 08/29/2015  . Smokeless tobacco: Never Used  . Alcohol Use: 0.0 oz/week    0 Standard drinks or equivalent per week     Comment: 12/23/2015 "last alcohol was ~ Christmas 2016; that was very little alcohol"  . Drug Use: No     Comment: 12/23/2015 "used to smoke marijuana; quit 04/2013"  . Sexual Activity: Yes    Birth Control/ Protection: Surgical    Other Topics Concern  . None   Social History Narrative   Patient does not drink caffeine.   Patient is right handed.     Review of Systems: Constitutional: Negative for fever, chills and weight loss.  Eyes: Negative for blurred vision.  Respiratory: Negative for cough and shortness of breath.  Cardiovascular: Negative for chest pain, palpitations and leg swelling.  Gastrointestinal: Negative for nausea, vomiting, abdominal pain, diarrhea, constipation and blood in stool.  Genitourinary: Negative for dysuria, urgency and frequency.  Musculoskeletal: Negative for myalgias and back pain.  Neurological: Negative for dizziness, weakness and headaches.     Objective:   Vital Signs: Filed Vitals:   02/18/16 0856  BP: 171/93  Pulse: 86  Temp: 98.3 F (36.8 C)  TempSrc: Oral  Weight: 198 lb 1.6 oz (89.858 kg)  SpO2: 100%      BP Readings from Last 3 Encounters:  02/18/16 171/93  02/05/16 106/67  02/05/16 104/64    Physical Exam: Constitutional: Vital signs reviewed.  Patient is in NAD and cooperative with exam.  Head: Normocephalic and atraumatic. Eyes: EOMI, conjunctivae nl, no scleral icterus.  Neck: Supple. Cardiovascular: RRR, no MRG. Pulmonary/Chest: normal effort, CTAB, no wheezes, rales, or rhonchi. Abdominal: Soft. NT/ND +BS. Neurological: A&O x3, cranial nerves II-XII are grossly intact, moving all extremities. Extremities: LE edema.  Skin: Warm, dry and intact. No rash.   Assessment & Plan:   Assessment and plan was discussed and formulated with my attending.     *NOTE SIGNED WITH ENCOUNTER BY DR. RICE AFTER SEEING THIS PATIENT DUE TO ABUSIVE LANGUAGE AND RUDENESS TO STAFF AFTER INITIAL ENCOUNTER WITH DR. Sharonna [name].  Collier Salina, MD 02/18/2016, 11:30 AM

## 2016-02-18 NOTE — Progress Notes (Signed)
Subjective:   Patient ID: Jane [name] female   DOB: [birth year] 40 y.o.   MRN: [id number]  HPI: [name] is a 40 y.o. woman with PMHx detailed below who presents for follow up of her hypertension and lymphedema of the left leg. Her blood pressure is high today and she reports being out of her lisinopril and HCTZ for the past 2 weeks. Her last appointment on 3/24 recommended a 1 week follow up that did not happen and refills were provided for only a limited duration. Since then she was seen at [facility] for abnormal uterine bleeding and associated moderate anemia. She has not had decreased exertional capacity but does report occasional dizzy spells since that time. These have not increased or decreased in frequency after running out of blood pressure medications.  See problem based assessment and plan below for additional details.  Past Medical History  Diagnosis Date  . Asthma   . Hyperlipidemia   . Seasonal allergies   . Noncompliance with medications   . Peripheral neuropathy ([place])   . Diabetic retinopathy ([place])   . Iron deficiency anemia   . Menorrhagia   . Hypertension   . Pneumonia "several times"  . Type II diabetes mellitus (HCC)     uncontrolled  . Sickle cell trait ([place])   . Depression   . Heart murmur   . Retinopathy of both eyes    Current Outpatient Prescriptions  Medication Sig Dispense Refill  . acetaminophen (TYLENOL) 500 MG tablet Take 1 tablet (500 mg total) by mouth every 6 (six) hours as needed for headache. 30 tablet 0  . albuterol (PROVENTIL HFA;VENTOLIN HFA) 108 (90 Base) MCG/ACT inhaler Inhale 2 puffs into the lungs every 4 (four) hours as needed for wheezing or shortness of breath (or coughing). 1 Inhaler 2  . aspirin EC 81 MG tablet Take 1 tablet (81 mg total) by mouth daily. 150 tablet 2  . atorvastatin (LIPITOR) 80 MG tablet Take 1 tablet (80 mg total) by mouth daily. 90 tablet 0  . hydrochlorothiazide (HYDRODIURIL) 25 MG  tablet Take 1 tablet (25 mg total) by mouth daily. 30 tablet 3  . ibuprofen (ADVIL,MOTRIN) 800 MG tablet Take 1 tablet (800 mg total) by mouth every 8 (eight) hours. 30 tablet 0  . insulin aspart protamine- aspart (NOVOLOG MIX 70/30) (70-30) 100 UNIT/ML injection Inject 0.18 mLs (18 Units total) into the skin 2 (two) times daily with a meal. 10 mL 11  . lisinopril (PRINIVIL,ZESTRIL) 20 MG tablet Take 1 tablet (20 mg total) by mouth daily. 30 tablet 3  . metoprolol succinate (TOPROL XL) 50 MG 24 hr tablet Take 1 tablet (50 mg total) by mouth daily. Take with or immediately following a meal. 30 tablet 5   No current facility-administered medications for this visit.   Family History  Problem Relation Age of Onset  . Stroke Mother   . Diabetes Mother   . Hypertension Mother   . Aneurysm Mother   . Diabetes Father   . Hypertension Father   . Diabetes Maternal Grandmother   . Diabetes Maternal Grandfather   . Cancer Paternal Grandfather     Prostate  . Diabetes Sister   . Hypertension Sister    Social History   Social History  . Marital Status: Single    Spouse Name: N/A  . Number of Children: 2  . Years of Education: 13   Occupational History  .   [employer]  History Main Topics  . Smoking status: Former Smoker -- 0.25 packs/day for 1 years    Types: Cigarettes    Quit date: 08/29/2015  . Smokeless tobacco: Never Used  . Alcohol Use: 0.0 oz/week    0 Standard drinks or equivalent per week     Comment: 12/23/2015 "last alcohol was ~ Christmas 2016; that was very little alcohol"  . Drug Use: No     Comment: 12/23/2015 "used to smoke marijuana; quit 04/2013"  . Sexual Activity: Yes    Birth Control/ Protection: Surgical   Other Topics Concern  . None   Social History Narrative   Patient does not drink caffeine.   Patient is right handed.    Review of Systems: Review of Systems  Constitutional: Negative for malaise/fatigue.  Eyes: Negative for blurred vision.    Respiratory: Negative for shortness of breath.   Cardiovascular: Positive for leg swelling. Negative for chest pain.  Musculoskeletal: Negative for falls.  Skin: Negative for rash.  Neurological: Positive for dizziness and headaches.    Objective:  Physical Exam: Filed Vitals:   02/18/16 0856  BP: 171/93  Pulse: 86  Temp: 98.3 F (36.8 C)  TempSrc: Oral  Weight: 198 lb 1.6 oz (89.858 kg)  SpO2: 100%   GENERAL- overweight woman, co-operative, NAD HEENT- No JVD CARDIAC- RRR, no murmurs, rubs or gallops. RESP- CTAB, no wheezes or crackles. EXTREMITIES- left leg in tight compression wrap and partial boot, edema in toes distal to boot with intact movement/sensation/capillary refill, right leg normal PSYCH- Normal mood and affect, appropriate thought content and speech.  Assessment & Plan:

## 2016-02-18 NOTE — Progress Notes (Signed)
Patient ID: Jane [name], female   DOB: [birth year], 40 y.o.   MRN: [id number] Pt found standing in waiting room stating "I don't know who the _____ that dr was but I don't ever want to see her again." I asked pt to control language and lower her voice and please come to empty exam room to explain her concerns to me in private. Environmental education officer joined [place] at that time. Pt stated she came for visit today and dr asked her about her medicines, which she had brought to the visit. She stated she tried to ask dr a question about her Lisinopril and dr said she wanted to review all her medicines first. Pt stated her b/p had been "up" and her head hurt and that she felt like her responses were being "cut off". She added that dr had asked her about her use of alcohol and drugs "and no one has asked me that before and I have been coming for over 20 yr." I let pt complete her sentences and then let her know that screening for drug and alcohol use is a standard of care for physicians because they can affect blood pressure and other health issues. When confronted by Environmental education officer and me about her use of profanity not being allowed in the clinic and being grounds for being terminated as a pt in the clinic, pt denied using profanity with dr and stated she knew she used it talking to me and apologized for doing so. We reviewed with her the culture of the health system that upheld the policy that pts should be treated with respect and that pts should treat physicians and staff with respect, which included not using inappropriate or profane language. I further clarified that she had the right to express her frustration, dissatisfaction, or other similar feelings as long as she did so respectfully. The Practice Manager arranged for pt to be able to see another physician today to address her b/p concerns and comfort measures given (ice water, tissues, etc.). Pt stated she understood the policy and wanted to be sure that the  physician's behavior was also addressed. We notified her that our "chain of command" was to notify the attending physician of any resident behavior issues, including pt complaints. We also offered the Practice Manager and myself as options for follow up discussion if needed. Yvonna Alanis, RN, Dept Director.

## 2016-02-18 NOTE — Assessment & Plan Note (Addendum)
Pt was extremely agitated when I asked her about her blood pressure medication and what she was taking.  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  She stated that her blood pressure was better at times.  When I asked her to elaborate she became very agitated at me and stated "you are pissing me the fuck off."  She then walked out of the room.   -unable to formulate a plan due to patient's uncooperativeness and agitation   Patient calmed down after reassurance by office staff and about an hour in the clinic. I then discussed her blood pressure which is uncontrolled today at 171 SBP after approximately 2 weeks of medication noncompliance due to running out of her prescriptions. This is most likely because at her last appointment she was recommended 2 week follow up that has not happened until now and had not requested refill through her pharmacy either. Her recent dizziness could be related to anemia undergoing evaluation at [facility] with transvaginal ultrasound soon(?) versus any relationship to her hypertension.  Plan: -Resume lisinopril 20mg  and HCTZ daily -Counseled her on reducing NSAID use as tolerable for her left leg pain -Asked her to keep track episodes of lightheadedness until next appointment -Needs multiple labs at next visit once she is on medications- Bmet, CBC, A1c -Needs close follow up, due to patient preferences requested within 1 month rather than sooner

## 2016-02-18 NOTE — Assessment & Plan Note (Signed)
Chronic LLE swelling and pain currently with ulceration on anterior ankle. She is now seeing [facility] wound [facility] clinic weekly for debridement and compression wrapping. She has undergone peripheral venography that did not have findings adequate to cause her current symptoms. She is taking ibuprofen frequently for pain and swelling that is likely exacerbating her chronic hypertension. She is very concerned about being out from her job during this process and wants to try working again soon.  -Continue compression wraps indefinitely -Elevate leg when able -continue weekly follow up with wound care -Patient can work if not being limited severely by pain and needs to spend part of time with leg elevation

## 2016-02-19 ENCOUNTER — Telehealth: Payer: Self-pay | Admitting: *Deleted

## 2016-02-19 ENCOUNTER — Other Ambulatory Visit (HOSPITAL_COMMUNITY): Payer: Self-pay | Admitting: Advanced Practice Midwife

## 2016-02-19 ENCOUNTER — Telehealth: Payer: Self-pay

## 2016-02-19 ENCOUNTER — Ambulatory Visit (HOSPITAL_COMMUNITY)
Admission: RE | Admit: 2016-02-19 | Discharge: 2016-02-19 | Disposition: A | Discharge status: Home / Self Care | Payer: BLUE CROSS/BLUE SHIELD | Source: Ambulatory Visit | Attending: Advanced Practice Midwife | Admitting: Advanced Practice Midwife

## 2016-02-19 ENCOUNTER — Other Ambulatory Visit: Payer: Self-pay | Admitting: Internal Medicine

## 2016-02-19 DIAGNOSIS — I10 Essential (primary) hypertension: Secondary | ICD-10-CM

## 2016-02-19 DIAGNOSIS — N939 Abnormal uterine and vaginal bleeding, unspecified: Secondary | ICD-10-CM | POA: Insufficient documentation

## 2016-02-19 MED ORDER — LISINOPRIL-HYDROCHLOROTHIAZIDE 20-25 MG PO TABS: 1.0000 | ORAL_TABLET | Freq: Every day | ORAL | Status: DC

## 2016-02-19 NOTE — Telephone Encounter (Signed)
Yes I just combined the two and discontinued the separate scripts, thanks!  Dr. Naaman Plummer

## 2016-02-19 NOTE — Telephone Encounter (Signed)
LVM for patient

## 2016-02-19 NOTE — Telephone Encounter (Signed)
Please call pt back.

## 2016-02-19 NOTE — Telephone Encounter (Signed)
Dr Naaman Plummer, this pt is very adamant that she be given a mix of hctz and lisinopril today, she states that "yall made the mistake not me". Please advise if you will combine the 2 and send a new script.

## 2016-02-22 ENCOUNTER — Ambulatory Visit (INDEPENDENT_AMBULATORY_CARE_PROVIDER_SITE_OTHER): Payer: BLUE CROSS/BLUE SHIELD | Admitting: Obstetrics & Gynecology

## 2016-02-22 ENCOUNTER — Encounter: Payer: Self-pay | Admitting: Obstetrics & Gynecology

## 2016-02-22 VITALS — BP 133/76 | HR 99 | Ht 65.0 in | Wt 195.0 lb

## 2016-02-22 DIAGNOSIS — N939 Abnormal uterine and vaginal bleeding, unspecified: Secondary | ICD-10-CM | POA: Diagnosis not present

## 2016-02-22 DIAGNOSIS — D500 Iron deficiency anemia secondary to blood loss (chronic): Secondary | ICD-10-CM | POA: Diagnosis not present

## 2016-02-22 DIAGNOSIS — Z3202 Encounter for pregnancy test, result negative: Secondary | ICD-10-CM

## 2016-02-22 MED ORDER — MEGESTROL ACETATE 20 MG PO TABS: 40.0000 mg | ORAL_TABLET | Freq: Every day | ORAL | Status: DC

## 2016-02-22 MED ORDER — FERROUS SULFATE 325 (65 FE) MG PO TABS: 325.0000 mg | ORAL_TABLET | Freq: Two times a day (BID) | ORAL | Status: DC

## 2016-02-22 NOTE — Progress Notes (Signed)
Patient ID: Jane [name], female   DOB: 01/13/1976, 40 y.o.   MRN: [id number] History:  40 y.o. [id number] here today for eval of abnormal uterine bleeding.  She has been bleeding since Mar 23rd. Was prev passing large clots but, they have stopped and she is only bleeding lightly now.  Pt reports monthly cycles that are heavy.  Occ pt reports 2 cycles in 1 month.  Reports prev being on OCPs to regulate cycles in her 20's.   Pt was last on meds for bleeding for many years ago.  Pt reports cramps with the bleeding. Pt is anemic and has had iron transfusions for anemia. She did not like taking the oral iron. Pt c/o feeling slightly lightheaded and dizzy at times.  Feels fine now.  She reports that she occ has chest pressure but, has none now.   She was told that she had a past MI from her last EKG.  The following portions of the patient's history were reviewed and updated as appropriate: allergies, current medications, past family history, past medical history, past social history, past surgical history and problem list.  Review of Systems:  Pertinent items are noted in HPI.  Objective:  Physical Exam Blood pressure 133/76, pulse 99, height 5\' 5"  (1.651 m), weight 195 lb (88.451 kg), last menstrual period 01/30/2016. Gen: NAD Pelvic exam done in MAU 02/05/16  Labs and Imaging US Transvaginal Non-ob  02/19/2016  CLINICAL DATA:  Abnormal uterine bleeding EXAM: TRANSABDOMINAL AND TRANSVAGINAL ULTRASOUND OF PELVIS TECHNIQUE: Both transabdominal and transvaginal ultrasound examinations of the pelvis were performed. Transabdominal technique was performed for global imaging of the pelvis including uterus, ovaries, adnexal regions, and pelvic cul-de-sac. It was necessary to proceed with endovaginal exam following the transabdominal exam to visualize the right ovary. COMPARISON:  None FINDINGS: Uterus Measurements: 10.3 x 6.7 x 7.0 cm. Low anterior C-section scar. Endometrium Thickness: 25 mm.  Thickened/heterogeneous with possible blood products in the endometrial cavity. Right ovary Measurements: 4.1 x 2.0 x 2.7 cm. Normal appearance/no adnexal mass. Left ovary Measurements: 2.8 x 1.6 x 2.0 cm. Normal appearance/no adnexal mass. Other findings No abnormal free fluid. IMPRESSION: Endometrium is thickened/ heterogeneous, measuring 25 mm, although some of this may reflect blood products in the endometrial cavity. If bleeding remains unresponsive to hormonal or medical therapy, focal lesion work-up with sonohysterogram should be considered. Endometrial biopsy should also be considered in pre-menopausal patients at high risk for endometrial carcinoma. (Ref: Radiological Reasoning: Algorithmic Workup of Abnormal Vaginal Bleeding with Endovaginal Sonography and Sonohysterography. AJR [id number]) Electronically Signed   By: Julian Hy M.D.   On: 02/19/2016 12:47   US Pelvis Complete  02/19/2016  CLINICAL DATA:  Abnormal uterine bleeding EXAM: TRANSABDOMINAL AND TRANSVAGINAL ULTRASOUND OF PELVIS TECHNIQUE: Both transabdominal and transvaginal ultrasound examinations of the pelvis were performed. Transabdominal technique was performed for global imaging of the pelvis including uterus, ovaries, adnexal regions, and pelvic cul-de-sac. It was necessary to proceed with endovaginal exam following the transabdominal exam to visualize the right ovary. COMPARISON:  None FINDINGS: Uterus Measurements: 10.3 x 6.7 x 7.0 cm. Low anterior C-section scar. Endometrium Thickness: 25 mm. Thickened/heterogeneous with possible blood products in the endometrial cavity. Right ovary Measurements: 4.1 x 2.0 x 2.7 cm. Normal appearance/no adnexal mass. Left ovary Measurements: 2.8 x 1.6 x 2.0 cm. Normal appearance/no adnexal mass. Other findings No abnormal free fluid. IMPRESSION: Endometrium is thickened/ heterogeneous, measuring 25 mm, although some of this may reflect blood products in the endometrial cavity.  If  bleeding remains unresponsive to hormonal or medical therapy, focal lesion work-up with sonohysterogram should be considered. Endometrial biopsy should also be considered in pre-menopausal patients at high risk for endometrial carcinoma. (Ref: Radiological Reasoning: Algorithmic Workup of Abnormal Vaginal Bleeding with Endovaginal Sonography and Sonohysterography. AJR [id number]) Electronically Signed   By: Julian Hy M.D.   On: 02/19/2016 12:47    Assessment & Plan:  AUB with possible symptomatic anemia  Megace 40mg  po q day CBC today F/u in 3 months May need Endobx or hysteroscopy in the future.  Will try to control bleeding with meds first. F/u with her primary care physician to eval chest pressure  Keavon Sensing L. Harraway-Smith, M.D., [name]

## 2016-02-22 NOTE — Patient Instructions (Signed)

## 2016-02-22 NOTE — Telephone Encounter (Signed)
Attempted to call, lm

## 2016-02-22 NOTE — Progress Notes (Signed)
Case discussed with Dr. Rice at the time of the visit.  We reviewed the resident's history and exam and pertinent patient test results.  I agree with the assessment, diagnosis, and plan of care documented in the resident's note. 

## 2016-02-23 ENCOUNTER — Inpatient Hospital Stay (HOSPITAL_COMMUNITY)
Admission: AD | Admit: 2016-02-23 | Discharge: 2016-02-23 | Disposition: A | Discharge status: Home / Self Care | Payer: BLUE CROSS/BLUE SHIELD | Source: Ambulatory Visit | Attending: Family Medicine | Admitting: Family Medicine

## 2016-02-23 ENCOUNTER — Encounter (HOSPITAL_COMMUNITY): Payer: Self-pay | Admitting: *Deleted

## 2016-02-23 ENCOUNTER — Telehealth: Payer: Self-pay | Admitting: Family Medicine

## 2016-02-23 DIAGNOSIS — F329 Major depressive disorder, single episode, unspecified: Secondary | ICD-10-CM | POA: Insufficient documentation

## 2016-02-23 DIAGNOSIS — J45909 Unspecified asthma, uncomplicated: Secondary | ICD-10-CM | POA: Insufficient documentation

## 2016-02-23 DIAGNOSIS — R42 Dizziness and giddiness: Secondary | ICD-10-CM | POA: Insufficient documentation

## 2016-02-23 DIAGNOSIS — R55 Syncope and collapse: Secondary | ICD-10-CM | POA: Insufficient documentation

## 2016-02-23 DIAGNOSIS — E11319 Type 2 diabetes mellitus with unspecified diabetic retinopathy without macular edema: Secondary | ICD-10-CM | POA: Insufficient documentation

## 2016-02-23 DIAGNOSIS — I10 Essential (primary) hypertension: Secondary | ICD-10-CM | POA: Insufficient documentation

## 2016-02-23 DIAGNOSIS — E785 Hyperlipidemia, unspecified: Secondary | ICD-10-CM | POA: Insufficient documentation

## 2016-02-23 DIAGNOSIS — Z9114 Patient's other noncompliance with medication regimen: Secondary | ICD-10-CM | POA: Insufficient documentation

## 2016-02-23 DIAGNOSIS — G629 Polyneuropathy, unspecified: Secondary | ICD-10-CM | POA: Insufficient documentation

## 2016-02-23 DIAGNOSIS — Z87891 Personal history of nicotine dependence: Secondary | ICD-10-CM | POA: Insufficient documentation

## 2016-02-23 DIAGNOSIS — D500 Iron deficiency anemia secondary to blood loss (chronic): Secondary | ICD-10-CM | POA: Insufficient documentation

## 2016-02-23 DIAGNOSIS — D573 Sickle-cell trait: Secondary | ICD-10-CM | POA: Insufficient documentation

## 2016-02-23 DIAGNOSIS — N939 Abnormal uterine and vaginal bleeding, unspecified: Secondary | ICD-10-CM | POA: Insufficient documentation

## 2016-02-23 LAB — CBC
HCT: 26.5 % — ABNORMAL LOW (ref 36.0–46.0)
HEMATOCRIT: 25.5 % — AB (ref 35.0–45.0)
Hemoglobin: 7.8 g/dL — ABNORMAL LOW (ref 11.7–15.5)
Hemoglobin: 8.3 g/dL — ABNORMAL LOW (ref 12.0–15.0)
MCH: 23.6 pg — AB (ref 27.0–33.0)
MCH: 23.7 pg — AB (ref 26.0–34.0)
MCHC: 30.6 g/dL — AB (ref 32.0–36.0)
MCHC: 31.3 g/dL (ref 30.0–36.0)
MCV: 77.3 fL — AB (ref 80.0–100.0)
MCV: 75.7 fL — AB (ref 78.0–100.0)
MPV: 8.7 fL (ref 7.5–12.5)
PLATELETS: 307 10*3/uL (ref 140–400)
PLATELETS: 278 10*3/uL (ref 150–400)
RBC: 3.3 MIL/uL — ABNORMAL LOW (ref 3.80–5.10)
RBC: 3.5 MIL/uL — AB (ref 3.87–5.11)
RDW: 23.4 % — AB (ref 11.0–15.0)
RDW: 22.5 % — AB (ref 11.5–15.5)
WBC: 6.6 10*3/uL (ref 3.8–10.8)
WBC: 4.9 10*3/uL (ref 4.0–10.5)

## 2016-02-23 LAB — POCT PREGNANCY, URINE: Preg Test, Ur: NEGATIVE

## 2016-02-23 MED ORDER — FERUMOXYTOL INJECTION 510 MG/17 ML
510.0000 mg | Freq: Once | INTRAVENOUS | Status: AC
  Administered 2016-02-23: 510 mg via INTRAVENOUS
  Filled 2016-02-23: qty 17

## 2016-02-23 MED ORDER — MEGESTROL ACETATE 40 MG PO TABS
120.0000 mg | ORAL_TABLET | Freq: Once | ORAL | Status: AC
  Administered 2016-02-23: 120 mg via ORAL
  Filled 2016-02-23: qty 3

## 2016-02-23 MED ORDER — SODIUM CHLORIDE 0.9 % IV BOLUS (SEPSIS)
1000.0000 mL | Freq: Once | INTRAVENOUS | Status: AC
  Administered 2016-02-23: 1000 mL via INTRAVENOUS

## 2016-02-23 NOTE — Telephone Encounter (Signed)
Pt called to [facility] c/o feeling weak, possibly needing transfusion from info given to her yesterday, advised that she have [facility], she was agreeable

## 2016-02-23 NOTE — Telephone Encounter (Signed)
Calling for test results ?

## 2016-02-23 NOTE — MAU Note (Addendum)
Has had a heavy cycle and clots, ongoing since March.  Having dizziness, nausea and pressure in chest along with it. Was seen at [facility] yesterday.  Was prescribed something yesterday to help stop the bleeding and iron; has not picked up the meds due to finances.  ? Iron or blood transfusion.  Bleeding is not  As heavy now, but feels so faint.

## 2016-02-23 NOTE — MAU Provider Note (Signed)
Chief Complaint: Dizziness; Nausea; and Vaginal Bleeding   None     SUBJECTIVE HPI: Jane [name] is a 40 y.o. [id number] who presents to maternity admissions reporting dizziness with standing and near syncope intermittently, nausea, and continued vaginal bleeding. She reports daily vaginal bleeding since 3/23 which is lighter in the last few days but still soaking several pads with dark red blood daily.   She reports dizziness with activity x 2 weeks but it is worsening today, and associated with nausea and feeling like she may pass out. She denies any actual syncope but reports she has to sit down when she becomes this dizzy and nauseous.  She also reports chest tightness when she feels dizzy, that resolves with rest.  Rest and lying down help her symptoms and activity makes them worse.  She came to MAU today because she is concerned about her symptoms and would like a blood transfusion.  She denies vaginal itching/burning, urinary symptoms, h/a, dizziness, n/v, or fever/chills.     HPI  Past Medical History  Diagnosis Date  . Asthma   . Hyperlipidemia   . Seasonal allergies   . Noncompliance with medications   . Peripheral neuropathy ([place])   . Diabetic retinopathy ([place])   . Iron deficiency anemia   . Menorrhagia   . Hypertension   . Pneumonia "several times"  . Type II diabetes mellitus ([facility])     uncontrolled  . Sickle cell trait ([place])   . Depression   . Heart murmur   . Retinopathy of both eyes    Past Surgical History  Procedure Laterality Date  . Cesarean section  1997  . Tubal ligation  1997   Social History   Social History  . Marital Status: Single    Spouse Name: N/A  . Number of Children: 2  . Years of Education: 13   Occupational History  .   [employer]    Social History Main Topics  . Smoking status: Former Smoker -- 0.25 packs/day for 1 years    Types: Cigarettes    Quit date: 08/29/2015  . Smokeless tobacco: Never Used  . Alcohol Use: 0.0 oz/week     0 Standard drinks or equivalent per week     Comment: 12/23/2015 "last alcohol was ~ Christmas 2016; that was very little alcohol"  . Drug Use: No     Comment: 12/23/2015 "used to smoke marijuana; quit 04/2013"  . Sexual Activity: Yes    Birth Control/ Protection: Surgical   Other Topics Concern  . Not on file   Social History Narrative   Patient does not drink caffeine.   Patient is right handed.    No current facility-administered medications on file prior to encounter.   Current Outpatient Prescriptions on File Prior to Encounter  Medication Sig Dispense Refill  . acetaminophen (TYLENOL) 500 MG tablet Take 1 tablet (500 mg total) by mouth every 6 (six) hours as needed for headache. (Patient taking differently: Take 1,000 mg by mouth every 6 (six) hours as needed for headache. ) 30 tablet 0  . aspirin EC 81 MG tablet Take 1 tablet (81 mg total) by mouth daily. 150 tablet 2  . atorvastatin (LIPITOR) 80 MG tablet Take 1 tablet (80 mg total) by mouth daily. 90 tablet 0  . ibuprofen (ADVIL,MOTRIN) 800 MG tablet Take 1 tablet (800 mg total) by mouth every 8 (eight) hours. (Patient taking differently: Take 800 mg by mouth every 8 (eight) hours as needed for moderate pain. )  30 tablet 0  . insulin aspart protamine- aspart (NOVOLOG MIX 70/30) (70-30) 100 UNIT/ML injection Inject 0.18 mLs (18 Units total) into the skin 2 (two) times daily with a meal. (Patient taking differently: Inject 15 Units into the skin 2 (two) times daily with a meal. ) 10 mL 11  . lisinopril-hydrochlorothiazide (PRINZIDE,ZESTORETIC) 20-25 MG tablet Take 1 tablet by mouth daily. 30 tablet 3  . metoprolol succinate (TOPROL XL) 50 MG 24 hr tablet Take 1 tablet (50 mg total) by mouth daily. Take with or immediately following a meal. 30 tablet 5  . albuterol (PROVENTIL HFA;VENTOLIN HFA) 108 (90 Base) MCG/ACT inhaler Inhale 2 puffs into the lungs every 4 (four) hours as needed for wheezing or shortness of breath (or coughing). 1  Inhaler 2   No Known Allergies  ROS:  Review of Systems  Constitutional: Positive for fatigue. Negative for fever and chills.  Respiratory: Positive for chest tightness. Negative for shortness of breath.   Cardiovascular: Negative for chest pain and palpitations.  Gastrointestinal: Positive for nausea. Negative for vomiting.  Genitourinary: Positive for vaginal bleeding and pelvic pain. Negative for dysuria, flank pain, vaginal discharge, difficulty urinating and vaginal pain.  Neurological: Positive for dizziness and headaches.  Psychiatric/Behavioral: Negative.      I have reviewed patient's Past Medical Hx, Surgical Hx, Family Hx, Social Hx, medications and allergies.   Physical Exam   Patient Vitals for the past 24 hrs:  BP Temp Temp src Pulse Resp SpO2  02/23/16 1554 115/61 mmHg 98.5 F (36.9 C) Oral 85 18 100 %   Constitutional: Well-developed, well-nourished female in no acute distress.  HEART: normal rate, heart sounds, regular rhythm RESP: normal effort, lung sounds clear and equal bilaterally GI: Abd soft, non-tender. Pos BS x 4 MS: Extremities nontender, no edema, normal ROM Neurologic: Alert and oriented x 4.  GU: Neg CVAT.  PELVIC EXAM: Deferred.  Pt soaked 1 pad in 2+ hours in MAU.    LAB RESULTS Results for orders placed or performed during the hospital encounter of 02/23/16 (from the past 24 hour(s))  CBC     Status: Abnormal   Collection Time: 02/23/16  4:52 PM  Result Value Ref Range   WBC 4.9 4.0 - 10.5 K/uL   RBC 3.50 (L) 3.87 - 5.11 MIL/uL   Hemoglobin 8.3 (L) 12.0 - 15.0 g/dL   HCT 26.5 (L) 36.0 - 46.0 %   MCV 75.7 (L) 78.0 - 100.0 fL   MCH 23.7 (L) 26.0 - 34.0 pg   MCHC 31.3 30.0 - 36.0 g/dL   RDW 22.5 (H) 11.5 - 15.5 %   Platelets 278 150 - 400 K/uL       IMAGING US Transvaginal Non-ob  02/19/2016  CLINICAL DATA:  Abnormal uterine bleeding EXAM: TRANSABDOMINAL AND TRANSVAGINAL ULTRASOUND OF PELVIS TECHNIQUE: Both transabdominal and  transvaginal ultrasound examinations of the pelvis were performed. Transabdominal technique was performed for global imaging of the pelvis including uterus, ovaries, adnexal regions, and pelvic cul-de-sac. It was necessary to proceed with endovaginal exam following the transabdominal exam to visualize the right ovary. COMPARISON:  None FINDINGS: Uterus Measurements: 10.3 x 6.7 x 7.0 cm. Low anterior C-section scar. Endometrium Thickness: 25 mm. Thickened/heterogeneous with possible blood products in the endometrial cavity. Right ovary Measurements: 4.1 x 2.0 x 2.7 cm. Normal appearance/no adnexal mass. Left ovary Measurements: 2.8 x 1.6 x 2.0 cm. Normal appearance/no adnexal mass. Other findings No abnormal free fluid. IMPRESSION: Endometrium is thickened/ heterogeneous, measuring 25 mm, although  some of this may reflect blood products in the endometrial cavity. If bleeding remains unresponsive to hormonal or medical therapy, focal lesion work-up with sonohysterogram should be considered. Endometrial biopsy should also be considered in pre-menopausal patients at high risk for endometrial carcinoma. (Ref: Radiological Reasoning: Algorithmic Workup of Abnormal Vaginal Bleeding with Endovaginal Sonography and Sonohysterography. AJR [id number]) Electronically Signed   By: Julian Hy M.D.   On: 02/19/2016 12:47   US Pelvis Complete  02/19/2016  CLINICAL DATA:  Abnormal uterine bleeding EXAM: TRANSABDOMINAL AND TRANSVAGINAL ULTRASOUND OF PELVIS TECHNIQUE: Both transabdominal and transvaginal ultrasound examinations of the pelvis were performed. Transabdominal technique was performed for global imaging of the pelvis including uterus, ovaries, adnexal regions, and pelvic cul-de-sac. It was necessary to proceed with endovaginal exam following the transabdominal exam to visualize the right ovary. COMPARISON:  None FINDINGS: Uterus Measurements: 10.3 x 6.7 x 7.0 cm. Low anterior C-section scar. Endometrium  Thickness: 25 mm. Thickened/heterogeneous with possible blood products in the endometrial cavity. Right ovary Measurements: 4.1 x 2.0 x 2.7 cm. Normal appearance/no adnexal mass. Left ovary Measurements: 2.8 x 1.6 x 2.0 cm. Normal appearance/no adnexal mass. Other findings No abnormal free fluid. IMPRESSION: Endometrium is thickened/ heterogeneous, measuring 25 mm, although some of this may reflect blood products in the endometrial cavity. If bleeding remains unresponsive to hormonal or medical therapy, focal lesion work-up with sonohysterogram should be considered. Endometrial biopsy should also be considered in pre-menopausal patients at high risk for endometrial carcinoma. (Ref: Radiological Reasoning: Algorithmic Workup of Abnormal Vaginal Bleeding with Endovaginal Sonography and Sonohysterography. AJR [id number]) Electronically Signed   By: Julian Hy M.D.   On: 02/19/2016 12:47    MAU Management/MDM: Ordered labs and reviewed results.  Consult Dr Nehemiah Settle.  With stable hemoglobin, administer NS x 1000 ml, fereheme x 1 IV dose, and Megace 120 mg PO x 1 dose in MAU.  Pt reported improved dizziness prior to D/C.  Pt to pick up Rx and start daily Megace tomorrow.  Bleeding precautions/reasons to return to MAU given.  Pt stable at time of discharge.  ASSESSMENT 1. Abnormal uterine bleeding (AUB)   2. Anemia due to chronic blood loss     PLAN Discharge home with bleeding precautions F/U in clinic as scheduled Return to MAU as needed for emergencies    Medication List    TAKE these medications        acetaminophen 500 MG tablet  Commonly known as:  TYLENOL  Take 1 tablet (500 mg total) by mouth every 6 (six) hours as needed for headache.     albuterol 108 (90 Base) MCG/ACT inhaler  Commonly known as:  PROVENTIL HFA;VENTOLIN HFA  Inhale 2 puffs into the lungs every 4 (four) hours as needed for wheezing or shortness of breath (or coughing).     aspirin EC 81 MG tablet  Take 1  tablet (81 mg total) by mouth daily.     atorvastatin 80 MG tablet  Commonly known as:  LIPITOR  Take 1 tablet (80 mg total) by mouth daily.     ibuprofen 800 MG tablet  Commonly known as:  ADVIL,MOTRIN  Take 1 tablet (800 mg total) by mouth every 8 (eight) hours.     insulin aspart protamine- aspart (70-30) 100 UNIT/ML injection  Commonly known as:  NOVOLOG MIX 70/30  Inject 0.18 mLs (18 Units total) into the skin 2 (two) times daily with a meal.     lisinopril-hydrochlorothiazide 20-25 MG tablet  Commonly known  as:  PRINZIDE,ZESTORETIC  Take 1 tablet by mouth daily.     metoprolol succinate 50 MG 24 hr tablet  Commonly known as:  TOPROL XL  Take 1 tablet (50 mg total) by mouth daily. Take with or immediately following a meal.       Follow-up Information    Follow up with [facility].   Specialty:  Obstetrics and Gynecology   Why:  As scheduled, Return to MAU as needed for emergencies   Contact information:   [place] Certified Nurse-Midwife 02/23/2016  8:21 PM

## 2016-02-23 NOTE — MAU Note (Signed)
Pt states she feels better after fluids and iron infusion. Pt states she desires to go home and rest. Discharge instructions given and pt verbalizes understanding.

## 2016-02-23 NOTE — Discharge Instructions (Signed)
Abnormal Uterine Bleeding °Abnormal uterine bleeding can affect women at various stages in life, including teenagers, women in their reproductive years, pregnant women, and women who have reached menopause. Several kinds of uterine bleeding are considered abnormal, including: °· Bleeding or spotting between periods.   °· Bleeding after sexual intercourse.   °· Bleeding that is heavier or more than normal.   °· Periods that last longer than usual. °· Bleeding after menopause.   °Many cases of abnormal uterine bleeding are minor and simple to treat, while others are more serious. Any type of abnormal bleeding should be evaluated by your health care provider. Treatment will depend on the cause of the bleeding. °HOME CARE INSTRUCTIONS °Monitor your condition for any changes. The following actions may help to alleviate any discomfort you are experiencing: °· Avoid the use of tampons and douches as directed by your health care provider. °· Change your pads frequently. °You should get regular pelvic exams and Pap tests. Keep all follow-up appointments for diagnostic tests as directed by your health care provider.  °SEEK MEDICAL CARE IF:  °· Your bleeding lasts more than 1 week.   °· You feel dizzy at times.   °SEEK IMMEDIATE MEDICAL CARE IF:  °· You pass out.   °· You are changing pads every 15 to 30 minutes.   °· You have abdominal pain. °· You have a fever.   °· You become sweaty or weak.   °· You are passing large blood clots from the vagina.   °· You start to feel nauseous and vomit. °MAKE SURE YOU:  °· Understand these instructions. °· Will watch your condition. °· Will get help right away if you are not doing well or get worse. °  °This information is not intended to replace advice given to you by your health care provider. Make sure you discuss any questions you have with your health care provider. °  °Document Released: 10/31/2005 Document Revised: 11/05/2013 Document Reviewed: 05/30/2013 °Elsevier Interactive  Patient Education ©2016 Elsevier Inc. ° °Iron Deficiency Anemia, Adult °Anemia is a condition in which there are less red blood cells or hemoglobin in the blood than normal. Hemoglobin is the part of red blood cells that carries oxygen. Iron deficiency anemia is anemia caused by too little iron. It is the most common type of anemia. It may leave you tired and short of breath. °CAUSES  °· Lack of iron in the diet. °· Poor absorption of iron, as seen with intestinal disorders. °· Intestinal bleeding. °· Heavy periods. °SIGNS AND SYMPTOMS  °Mild anemia may not be noticeable. Symptoms may include: °· Fatigue. °· Headache. °· Pale skin. °· Weakness. °· Tiredness. °· Shortness of breath. °· Dizziness. °· Cold hands and feet. °· Fast or irregular heartbeat. °DIAGNOSIS  °Diagnosis requires a thorough evaluation and physical exam by your health care provider. Blood tests are generally used to confirm iron deficiency anemia. Additional tests may be done to find the underlying cause of your anemia. These may include: °· Testing for blood in the stool (fecal occult blood test). °· A procedure to see inside the colon and rectum (colonoscopy). °· A procedure to see inside the esophagus and stomach (endoscopy). °TREATMENT  °Iron deficiency anemia is treated by correcting the cause of the deficiency. Treatment may involve: °· Adding iron-rich foods to your diet. °· Taking iron supplements. Pregnant or breastfeeding women need to take extra iron because their normal diet usually does not provide the required amount. °· Taking vitamins. Vitamin C improves the absorption of iron. Your health care provider may recommend   that you take your iron tablets with a glass of orange juice or vitamin C supplement. °· Medicines to make heavy menstrual flow lighter. °· Surgery. °HOME CARE INSTRUCTIONS  °· Take iron as directed by your health care provider. °¨ If you cannot tolerate taking iron supplements by mouth, talk to your health care  provider about taking them through a vein (intravenously) or an injection into a muscle. °¨ For the best iron absorption, iron supplements should be taken on an empty stomach. If you cannot tolerate them on an empty stomach, you may need to take them with food. °¨ Do not drink milk or take antacids at the same time as your iron supplements. Milk and antacids may interfere with the absorption of iron. °¨ Iron supplements can cause constipation. Make sure to include fiber in your diet to prevent constipation. A stool softener may also be recommended. °· Take vitamins as directed by your health care provider. °· Eat a diet rich in iron. Foods high in iron include liver, lean beef, whole-grain bread, eggs, dried fruit, and dark green leafy vegetables. °SEEK IMMEDIATE MEDICAL CARE IF:  °· You faint. If this happens, do not drive. Call your local emergency services (911 in [place] if no other help is available. °· You have chest pain. °· You feel nauseous or vomit. °· You have severe or increased shortness of breath with activity. °· You feel weak. °· You have a rapid heartbeat. °· You have unexplained sweating. °· You become light-headed when getting up from a chair or bed. °MAKE SURE YOU:  °· Understand these instructions. °· Will watch your condition. °· Will get help right away if you are not doing well or get worse. °  °This information is not intended to replace advice given to you by your health care provider. Make sure you discuss any questions you have with your health care provider. °  °Document Released: 10/28/2000 Document Revised: 11/21/2014 Document Reviewed: 07/08/2013 °Elsevier Interactive Patient Education ©2016 Elsevier Inc. ° °

## 2016-02-23 NOTE — Telephone Encounter (Signed)
Pt is currently at [facility] being check out per note.

## 2016-02-24 ENCOUNTER — Encounter: Payer: Self-pay | Admitting: *Deleted

## 2016-02-29 ENCOUNTER — Encounter: Payer: Self-pay | Admitting: Internal Medicine

## 2016-02-29 ENCOUNTER — Telehealth: Payer: Self-pay | Admitting: General Practice

## 2016-02-29 NOTE — Telephone Encounter (Signed)
Per Dr Ihor Dow, need to contact patient to ensure she is taking megace & iron supplement BID. Patient needs to follow up in 3 months. Called patient and discussed recommendations with her. Patient states she is taking iron once a day but will increase & she has been taking megace since last week. Patient aware to call in June for July appt. Patient had no questions

## 2016-03-04 ENCOUNTER — Emergency Department (HOSPITAL_COMMUNITY)
Admission: EM | Admit: 2016-03-04 | Discharge: 2016-03-04 | Disposition: A | Discharge status: Home / Self Care | Payer: BLUE CROSS/BLUE SHIELD | Attending: Emergency Medicine | Admitting: Emergency Medicine

## 2016-03-04 ENCOUNTER — Encounter (HOSPITAL_COMMUNITY): Payer: Self-pay | Admitting: *Deleted

## 2016-03-04 DIAGNOSIS — Z87891 Personal history of nicotine dependence: Secondary | ICD-10-CM | POA: Insufficient documentation

## 2016-03-04 DIAGNOSIS — R110 Nausea: Secondary | ICD-10-CM

## 2016-03-04 DIAGNOSIS — Z79899 Other long term (current) drug therapy: Secondary | ICD-10-CM | POA: Insufficient documentation

## 2016-03-04 DIAGNOSIS — I10 Essential (primary) hypertension: Secondary | ICD-10-CM | POA: Insufficient documentation

## 2016-03-04 DIAGNOSIS — E785 Hyperlipidemia, unspecified: Secondary | ICD-10-CM | POA: Insufficient documentation

## 2016-03-04 DIAGNOSIS — J45909 Unspecified asthma, uncomplicated: Secondary | ICD-10-CM | POA: Insufficient documentation

## 2016-03-04 DIAGNOSIS — Z3202 Encounter for pregnancy test, result negative: Secondary | ICD-10-CM | POA: Insufficient documentation

## 2016-03-04 DIAGNOSIS — E11319 Type 2 diabetes mellitus with unspecified diabetic retinopathy without macular edema: Secondary | ICD-10-CM | POA: Insufficient documentation

## 2016-03-04 DIAGNOSIS — Z9119 Patient's noncompliance with other medical treatment and regimen: Secondary | ICD-10-CM | POA: Insufficient documentation

## 2016-03-04 DIAGNOSIS — Z794 Long term (current) use of insulin: Secondary | ICD-10-CM | POA: Insufficient documentation

## 2016-03-04 DIAGNOSIS — Z8701 Personal history of pneumonia (recurrent): Secondary | ICD-10-CM | POA: Insufficient documentation

## 2016-03-04 DIAGNOSIS — Z8659 Personal history of other mental and behavioral disorders: Secondary | ICD-10-CM | POA: Insufficient documentation

## 2016-03-04 DIAGNOSIS — R42 Dizziness and giddiness: Secondary | ICD-10-CM | POA: Insufficient documentation

## 2016-03-04 DIAGNOSIS — Z862 Personal history of diseases of the blood and blood-forming organs and certain disorders involving the immune mechanism: Secondary | ICD-10-CM | POA: Insufficient documentation

## 2016-03-04 DIAGNOSIS — Z7982 Long term (current) use of aspirin: Secondary | ICD-10-CM | POA: Insufficient documentation

## 2016-03-04 DIAGNOSIS — Z8742 Personal history of other diseases of the female genital tract: Secondary | ICD-10-CM | POA: Insufficient documentation

## 2016-03-04 DIAGNOSIS — R011 Cardiac murmur, unspecified: Secondary | ICD-10-CM | POA: Insufficient documentation

## 2016-03-04 LAB — URINE MICROSCOPIC-ADD ON

## 2016-03-04 LAB — CBC
HCT: 25.4 % — ABNORMAL LOW (ref 36.0–46.0)
Hemoglobin: 8.1 g/dL — ABNORMAL LOW (ref 12.0–15.0)
MCH: 25.1 pg — ABNORMAL LOW (ref 26.0–34.0)
MCHC: 31.9 g/dL (ref 30.0–36.0)
MCV: 78.6 fL (ref 78.0–100.0)
PLATELETS: 306 10*3/uL (ref 150–400)
RBC: 3.23 MIL/uL — ABNORMAL LOW (ref 3.87–5.11)
RDW: 21.7 % — AB (ref 11.5–15.5)
WBC: 5 10*3/uL (ref 4.0–10.5)

## 2016-03-04 LAB — PREGNANCY, URINE: PREG TEST UR: NEGATIVE

## 2016-03-04 LAB — BASIC METABOLIC PANEL
Anion gap: 12 (ref 5–15)
BUN: 39 mg/dL — AB (ref 6–20)
CHLORIDE: 102 mmol/L (ref 101–111)
CO2: 22 mmol/L (ref 22–32)
CREATININE: 1.81 mg/dL — AB (ref 0.44–1.00)
Calcium: 9 mg/dL (ref 8.9–10.3)
GFR calc Af Amer: 39 mL/min — ABNORMAL LOW (ref >60)
GFR, EST NON AFRICAN AMERICAN: 34 mL/min — AB (ref >60)
Glucose, Bld: 189 mg/dL — ABNORMAL HIGH (ref 65–99)
Potassium: 4.1 mmol/L (ref 3.5–5.1)
SODIUM: 136 mmol/L (ref 135–145)

## 2016-03-04 LAB — URINALYSIS, ROUTINE W REFLEX MICROSCOPIC
BILIRUBIN URINE: NEGATIVE
GLUCOSE, UA: NEGATIVE mg/dL
KETONES UR: NEGATIVE mg/dL
LEUKOCYTES UA: NEGATIVE
Nitrite: NEGATIVE
Specific Gravity, Urine: 1.014 (ref 1.005–1.030)
pH: 6 (ref 5.0–8.0)

## 2016-03-04 LAB — CBG MONITORING, ED: Glucose-Capillary: 191 mg/dL — ABNORMAL HIGH (ref 65–99)

## 2016-03-04 NOTE — ED Notes (Signed)
C/o dizziness and nausea onset 1 year ago , states she is tired of it happening. No different

## 2016-03-04 NOTE — Discharge Instructions (Signed)
Nausea, Adult Jane [name], see a primary care physician within 3 days regarding your symptoms.  Drink plenty of fluids to avoid dehydration.  If there is any worsening, come back to the ED immediately. Thank you. Nausea means you feel sick to your stomach or need to throw up (vomit). It may be a sign of a more serious problem. If nausea gets worse, you may throw up. If you throw up a lot, you may lose too much body fluid (dehydration). HOME CARE   Get plenty of rest.  Ask your doctor how to replace body fluid losses (rehydrate).  Eat small amounts of food. Sip liquids more often.  Take all medicines as told by your doctor. GET HELP RIGHT AWAY IF:  You have a fever.  You pass out (faint).  You keep throwing up or have blood in your throw up.  You are very weak, have dry lips or a dry mouth, or you are very thirsty (dehydrated).  You have dark or bloody poop (stool).  You have very bad chest or belly (abdominal) pain.  You do not get better after 2 days, or you get worse.  You have a headache. MAKE SURE YOU:  Understand these instructions.  Will watch your condition.  Will get help right away if you are not doing well or get worse.   This information is not intended to replace advice given to you by your health care provider. Make sure you discuss any questions you have with your health care provider.   Document Released: 10/20/2011 Document Revised: 01/23/2012 Document Reviewed: 10/20/2011 Elsevier Interactive Patient Education 2016 Elsevier Inc. Dizziness Dizziness is a common problem. It makes you feel unsteady or lightheaded. You may feel like you are about to pass out (faint). Dizziness can lead to injury if you stumble or fall. Anyone can get dizzy, but dizziness is more common in older adults. This condition can be caused by a number of things, including:  Medicines.  Dehydration.  Illness. HOME CARE Following these instructions may help with your  condition: Eating and Drinking  Drink enough fluid to keep your pee (urine) clear or pale yellow. This helps to keep you from getting dehydrated. Try to drink more clear fluids, such as water.  Do not drink alcohol.  Limit how much caffeine you drink or eat if told by your doctor.  Limit how much salt you drink or eat if told by your doctor. Activity  Avoid making quick movements.  When you stand up from sitting in a chair, steady yourself until you feel okay.  In the morning, first sit up on the side of the bed. When you feel okay, stand slowly while you hold onto something. Do this until you know that your balance is fine.  Move your legs often if you need to stand in one place for a long time. Tighten and relax your muscles in your legs while you are standing.  Do not drive or use heavy machinery if you feel dizzy.  Avoid bending down if you feel dizzy. Place items in your home so that they are easy for you to reach without leaning over. Lifestyle  Do not use any tobacco products, including cigarettes, chewing tobacco, or electronic cigarettes. If you need help quitting, ask your doctor.  Try to lower your stress level, such as with yoga or meditation. Talk with your doctor if you need help. General Instructions  Watch your dizziness for any changes.  Take medicines only as told by  your doctor. Talk with your doctor if you think that your dizziness is caused by a medicine that you are taking.  Tell a friend or a family member that you are feeling dizzy. If he or she notices any changes in your behavior, have this person call your doctor.  Keep all follow-up visits as told by your doctor. This is important. GET HELP IF:  Your dizziness does not go away.  Your dizziness or light-headedness gets worse.  You feel sick to your stomach (nauseous).  You have trouble hearing.  You have new symptoms.  You are unsteady on your feet or you feel like the room is spinning. GET  HELP RIGHT AWAY IF:  You throw up (vomit) or have diarrhea and are unable to eat or drink anything.  You have trouble:  Talking.  Walking.  Swallowing.  Using your arms, hands, or legs.  You feel generally weak.  You are not thinking clearly or you have trouble forming sentences. It may take a friend or family member to notice this.  You have:  Chest pain.  Pain in your belly (abdomen).  Shortness of breath.  Sweating.  Your vision changes.  You are bleeding.  You have a headache.  You have neck pain or a stiff neck.  You have a fever.   This information is not intended to replace advice given to you by your health care provider. Make sure you discuss any questions you have with your health care provider.   Document Released: 10/20/2011 Document Revised: 03/17/2015 Document Reviewed: 10/27/2014 Elsevier Interactive Patient Education [employer].

## 2016-03-04 NOTE — ED Notes (Signed)
Pt refusing to leave. MD informed. RN informed of current situation

## 2016-03-04 NOTE — ED Notes (Signed)
CBG 191. RN notified.

## 2016-03-04 NOTE — ED Notes (Signed)
Called Pt twice in waiting room with no response.

## 2016-03-04 NOTE — ED Provider Notes (Signed)
CSN: [id number]     Arrival date & time 03/04/16  0405 History   First MD Initiated Contact with Patient 03/04/16 [phone]     Chief Complaint  Patient presents with  . Dizziness  . Nausea     (Consider location/radiation/quality/duration/timing/severity/associated sxs/prior Treatment) HPI  Jane [name] is a 40 y.o. female with past medical history of medication noncompliance, dysfunctional uterine bleeding, diabetes, presented today with dizziness and nausea. She states this has been going on for for 1 year. Patient states she has not followed up with her primary care physician regarding this. She saw an OB/GYN at [facility] who placed her on iron for being close to anemic. She states her vaginal bleeding is now under control. She denies any fevers or recent infections. She states the dizziness and nausea has not changed over the course of the 1 year. She does not know what makes it better or worse. There are no further complaints.  10 Systems reviewed and are negative for acute change except as noted in the HPI.      Past Medical History  Diagnosis Date  . Asthma   . Hyperlipidemia   . Seasonal allergies   . Noncompliance with medications   . Peripheral neuropathy ([place])   . Diabetic retinopathy ([place])   . Iron deficiency anemia   . Menorrhagia   . Hypertension   . Pneumonia "several times"  . Type II diabetes mellitus ([facility])     uncontrolled  . Sickle cell trait ([place])   . Depression   . Heart murmur   . Retinopathy of both eyes    Past Surgical History  Procedure Laterality Date  . Cesarean section  1997  . Tubal ligation  1997   Family History  Problem Relation Age of Onset  . Stroke Mother   . Diabetes Mother   . Hypertension Mother   . Aneurysm Mother   . Diabetes Father   . Hypertension Father   . Diabetes Maternal Grandmother   . Diabetes Maternal Grandfather   . Cancer Paternal Grandfather     Prostate  . Diabetes Sister   . Hypertension Sister     Social History  Substance Use Topics  . Smoking status: Former Smoker -- 0.25 packs/day for 1 years    Types: Cigarettes    Quit date: 08/29/2015  . Smokeless tobacco: Never Used  . Alcohol Use: No     Comment: 12/23/2015 "last alcohol was ~ Christmas 2016; that was very little alcohol"   OB History    Gravida Para Term Preterm AB TAB SAB Ectopic Multiple Living   2 2 1 1  0 0 0 0 0 2     Review of Systems    Allergies  Review of patient's allergies indicates no known allergies.  Home Medications   Prior to Admission medications   Medication Sig Start Date End Date Taking? Authorizing Provider  acetaminophen (TYLENOL) 500 MG tablet Take 1 tablet (500 mg total) by mouth every 6 (six) hours as needed for headache. Patient taking differently: Take 1,000 mg by mouth every 6 (six) hours as needed for headache.  01/25/16   Dellia Nims, MD  albuterol (PROVENTIL HFA;VENTOLIN HFA) 108 (90 Base) MCG/ACT inhaler Inhale 2 puffs into the lungs every 4 (four) hours as needed for wheezing or shortness of breath (or coughing). [id number]   Delora Fuel, MD  aspirin EC 81 MG tablet Take 1 tablet (81 mg total) by mouth daily. 01/25/16 01/24/17  Dellia Nims, MD  atorvastatin (LIPITOR) 80 MG tablet Take 1 tablet (80 mg total) by mouth daily. 01/01/16 12/31/16  Juluis Mire, MD  ibuprofen (ADVIL,MOTRIN) 800 MG tablet Take 1 tablet (800 mg total) by mouth every 8 (eight) hours. Patient taking differently: Take 800 mg by mouth every 8 (eight) hours as needed for moderate pain.  02/05/16   Tresea Mall, CNM  insulin aspart protamine- aspart (NOVOLOG MIX 70/30) (70-30) 100 UNIT/ML injection Inject 0.18 mLs (18 Units total) into the skin 2 (two) times daily with a meal. Patient taking differently: Inject 15 Units into the skin 2 (two) times daily with a meal.  01/01/16   Juluis Mire, MD  lisinopril-hydrochlorothiazide (PRINZIDE,ZESTORETIC) 20-25 MG tablet Take 1 tablet by mouth daily. 02/19/16   Juluis Mire, MD  metoprolol succinate (TOPROL XL) 50 MG 24 hr tablet Take 1 tablet (50 mg total) by mouth daily. Take with or immediately following a meal. 02/02/16 02/01/17  Corky Sox, MD   BP 146/78 mmHg  Pulse 96  Temp(Src) 98.6 F (37 C) (Oral)  Resp 18  Ht 5\' 5"  (1.651 m)  Wt 193 lb (87.544 kg)  BMI 32.12 kg/m2  SpO2 100%  LMP 01/30/2016 Physical Exam  Constitutional: She is oriented to person, place, and time. She appears well-developed and well-nourished. No distress.  HENT:  Head: Normocephalic and atraumatic.  Nose: Nose normal.  Mouth/Throat: Oropharynx is clear and moist. No oropharyngeal exudate.  Eyes: Conjunctivae and EOM are normal. Pupils are equal, round, and reactive to light. No scleral icterus.  Neck: Normal range of motion. Neck supple. No JVD present. No tracheal deviation present. No thyromegaly present.  Cardiovascular: Normal rate, regular rhythm and normal heart sounds.  Exam reveals no gallop and no friction rub.   No murmur heard. Pulmonary/Chest: Effort normal and breath sounds normal. No respiratory distress. She has no wheezes. She exhibits no tenderness.  Abdominal: Soft. Bowel sounds are normal. She exhibits no distension and no mass. There is no tenderness. There is no rebound and no guarding.  Musculoskeletal: Normal range of motion. She exhibits no edema or tenderness.  Lymphadenopathy:    She has no cervical adenopathy.  Neurological: She is alert and oriented to person, place, and time. No cranial nerve deficit. She exhibits normal muscle tone.  Skin: Skin is warm and dry. No rash noted. No erythema. No pallor.  Nursing note and vitals reviewed.   ED Course  Procedures (including critical care time) Labs Review Labs Reviewed  BASIC METABOLIC PANEL - Abnormal; Notable for the following:    Glucose, Bld 189 (*)    BUN 39 (*)    Creatinine, Ser 1.81 (*)    GFR calc non Af Amer 34 (*)    GFR calc Af Amer 39 (*)    All other components within  normal limits  CBC - Abnormal; Notable for the following:    RBC 3.23 (*)    Hemoglobin 8.1 (*)    HCT 25.4 (*)    MCH 25.1 (*)    RDW 21.7 (*)    All other components within normal limits  URINALYSIS, ROUTINE W REFLEX MICROSCOPIC (NOT AT [facility]) - Abnormal; Notable for the following:    Hgb urine dipstick SMALL (*)    Protein, ur >300 (*)    All other components within normal limits  URINE MICROSCOPIC-ADD ON - Abnormal; Notable for the following:    Squamous Epithelial / LPF 0-5 (*)    Bacteria, UA RARE (*)  Casts HYALINE CASTS (*)    All other components within normal limits  CBG MONITORING, ED - Abnormal; Notable for the following:    Glucose-Capillary 191 (*)    All other components within normal limits  PREGNANCY, URINE    Imaging Review No results found. I have personally reviewed and evaluated these images and lab results as part of my medical decision-making.   EKG Interpretation   Date/Time:  Friday March 04 2016 04:11:35 EDT Ventricular Rate:  96 PR Interval:  150 QRS Duration: 72 QT Interval:  344 QTC Calculation: 434 R Axis:   100 Text Interpretation:  Normal sinus rhythm Rightward axis Borderline ECG No  significant change since last tracing Confirmed by Glynn Octave  [phone]) on 03/04/2016 5:17:53 AM      MDM   Final diagnoses:  None   patient presents to the emergency department for dizziness and nausea for one year. I doubt any serious intracranial abnormality or intra-abdominal process due to her one year of symptoms. Laboratory studies were obtained by triage and do not reveal any acute cause. Her CBC shows a hemoglobin of 8.1. Cr elevated to 1.8.    5:48 AM Patient told to drink plenty of fluids for rehydration.  She is advised to follow-up with her primary care physician regarding this. EKG is unremarkable.  She appears well and in NAD.  VS remain within her normal limits and she is safe for DC.  Everlene Balls, MD 03/04/16 [phone]

## 2016-03-13 ENCOUNTER — Emergency Department (HOSPITAL_COMMUNITY): Payer: BLUE CROSS/BLUE SHIELD

## 2016-03-13 ENCOUNTER — Encounter (HOSPITAL_COMMUNITY): Payer: Self-pay | Admitting: Emergency Medicine

## 2016-03-13 ENCOUNTER — Emergency Department (HOSPITAL_COMMUNITY)
Admission: EM | Admit: 2016-03-13 | Discharge: 2016-03-13 | Disposition: A | Discharge status: Home / Self Care | Payer: BLUE CROSS/BLUE SHIELD | Attending: Emergency Medicine | Admitting: Emergency Medicine

## 2016-03-13 DIAGNOSIS — I10 Essential (primary) hypertension: Secondary | ICD-10-CM | POA: Insufficient documentation

## 2016-03-13 DIAGNOSIS — S99922A Unspecified injury of left foot, initial encounter: Secondary | ICD-10-CM | POA: Insufficient documentation

## 2016-03-13 DIAGNOSIS — E785 Hyperlipidemia, unspecified: Secondary | ICD-10-CM | POA: Insufficient documentation

## 2016-03-13 DIAGNOSIS — Z8669 Personal history of other diseases of the nervous system and sense organs: Secondary | ICD-10-CM | POA: Insufficient documentation

## 2016-03-13 DIAGNOSIS — E11319 Type 2 diabetes mellitus with unspecified diabetic retinopathy without macular edema: Secondary | ICD-10-CM | POA: Insufficient documentation

## 2016-03-13 DIAGNOSIS — Z8659 Personal history of other mental and behavioral disorders: Secondary | ICD-10-CM | POA: Insufficient documentation

## 2016-03-13 DIAGNOSIS — Y9289 Other specified places as the place of occurrence of the external cause: Secondary | ICD-10-CM | POA: Insufficient documentation

## 2016-03-13 DIAGNOSIS — Z8742 Personal history of other diseases of the female genital tract: Secondary | ICD-10-CM | POA: Insufficient documentation

## 2016-03-13 DIAGNOSIS — Z7982 Long term (current) use of aspirin: Secondary | ICD-10-CM | POA: Insufficient documentation

## 2016-03-13 DIAGNOSIS — Z8701 Personal history of pneumonia (recurrent): Secondary | ICD-10-CM | POA: Insufficient documentation

## 2016-03-13 DIAGNOSIS — Z9119 Patient's noncompliance with other medical treatment and regimen: Secondary | ICD-10-CM | POA: Insufficient documentation

## 2016-03-13 DIAGNOSIS — M79672 Pain in left foot: Secondary | ICD-10-CM

## 2016-03-13 DIAGNOSIS — Z794 Long term (current) use of insulin: Secondary | ICD-10-CM | POA: Insufficient documentation

## 2016-03-13 DIAGNOSIS — Y9301 Activity, walking, marching and hiking: Secondary | ICD-10-CM | POA: Insufficient documentation

## 2016-03-13 DIAGNOSIS — X501XXA Overexertion from prolonged static or awkward postures, initial encounter: Secondary | ICD-10-CM | POA: Insufficient documentation

## 2016-03-13 DIAGNOSIS — Z87891 Personal history of nicotine dependence: Secondary | ICD-10-CM | POA: Insufficient documentation

## 2016-03-13 DIAGNOSIS — Z79899 Other long term (current) drug therapy: Secondary | ICD-10-CM | POA: Insufficient documentation

## 2016-03-13 DIAGNOSIS — R011 Cardiac murmur, unspecified: Secondary | ICD-10-CM | POA: Insufficient documentation

## 2016-03-13 DIAGNOSIS — Y998 Other external cause status: Secondary | ICD-10-CM | POA: Insufficient documentation

## 2016-03-13 DIAGNOSIS — J45909 Unspecified asthma, uncomplicated: Secondary | ICD-10-CM | POA: Insufficient documentation

## 2016-03-13 DIAGNOSIS — D509 Iron deficiency anemia, unspecified: Secondary | ICD-10-CM | POA: Insufficient documentation

## 2016-03-13 NOTE — Discharge Instructions (Signed)

## 2016-03-13 NOTE — ED Provider Notes (Signed)
CSN: [id number]     Arrival date & time 03/13/16  0007 History   First MD Initiated Contact with Patient 03/13/16 0032     Chief Complaint  Patient presents with  . Joint Swelling     (Consider location/radiation/quality/duration/timing/severity/associated sxs/prior Treatment) HPI Comments: 40 year old female presents to the emergency department for evaluation of left foot pain. She states that she was walking down the steps at 2230 when she twisted her foot. She has noted increased swelling to her left foot and ankle which has been worsening since the injury. No medications taken prior to arrival for symptoms. Patient does state that she has recently been seen by a wound care clinic and released from their care for a wound to her left ankle. She denies any new or worsening numbness in her left lower extremity as well as any head trauma or LOC associated with her misstep on the stairs.  The history is provided by the patient. No language interpreter was used.    Past Medical History  Diagnosis Date  . Asthma   . Hyperlipidemia   . Seasonal allergies   . Noncompliance with medications   . Peripheral neuropathy ([place])   . Diabetic retinopathy ([place])   . Iron deficiency anemia   . Menorrhagia   . Hypertension   . Pneumonia "several times"  . Type II diabetes mellitus (HCC)     uncontrolled  . Sickle cell trait ([place])   . Depression   . Heart murmur   . Retinopathy of both eyes    Past Surgical History  Procedure Laterality Date  . Cesarean section  1997  . Tubal ligation  1997   Family History  Problem Relation Age of Onset  . Stroke Mother   . Diabetes Mother   . Hypertension Mother   . Aneurysm Mother   . Diabetes Father   . Hypertension Father   . Diabetes Maternal Grandmother   . Diabetes Maternal Grandfather   . Cancer Paternal Grandfather     Prostate  . Diabetes Sister   . Hypertension Sister    Social History  Substance Use Topics  . Smoking status: Former  Smoker -- 0.25 packs/day for 1 years    Types: Cigarettes    Quit date: 08/29/2015  . Smokeless tobacco: Never Used  . Alcohol Use: No     Comment: 12/23/2015 "last alcohol was ~ Christmas 2016; that was very little alcohol"   OB History    Gravida Para Term Preterm AB TAB SAB Ectopic Multiple Living   2 2 1 1  0 0 0 0 0 2      Review of Systems  Constitutional: Negative for fever.  Musculoskeletal: Positive for joint swelling and arthralgias.  Neurological: Negative for weakness and numbness.  All other systems reviewed and are negative.   Allergies  Review of patient's allergies indicates no known allergies.  Home Medications   Prior to Admission medications   Medication Sig Start Date End Date Taking? Authorizing Provider  acetaminophen (TYLENOL) 500 MG tablet Take 1 tablet (500 mg total) by mouth every 6 (six) hours as needed for headache. Patient taking differently: Take 1,000 mg by mouth every 6 (six) hours as needed for headache.  01/25/16  Yes Tasrif Ahmed, MD  aspirin EC 81 MG tablet Take 1 tablet (81 mg total) by mouth daily. 01/25/16 01/24/17 Yes Tasrif Ahmed, MD  atorvastatin (LIPITOR) 80 MG tablet Take 1 tablet (80 mg total) by mouth daily. 01/01/16 12/31/16 Yes Juluis Mire, MD  ferrous sulfate 325 (65 FE) MG tablet Take 325 mg by mouth daily with breakfast.   Yes Historical Provider, MD  insulin aspart protamine- aspart (NOVOLOG MIX 70/30) (70-30) 100 UNIT/ML injection Inject 0.18 mLs (18 Units total) into the skin 2 (two) times daily with a meal. Patient taking differently: Inject 15 Units into the skin 2 (two) times daily with a meal.  01/01/16  Yes Marjan Rabbani, MD  lisinopril-hydrochlorothiazide (PRINZIDE,ZESTORETIC) 20-25 MG tablet Take 1 tablet by mouth daily. 02/19/16  Yes Juluis Mire, MD  Megestrol Acetate (MEGACE ES PO) Take 1 tablet by mouth 2 (two) times daily. Patient states she takes an unknown dose of this medication twice daily last dose 03/12/2016   Yes  Historical Provider, MD  metoprolol succinate (TOPROL XL) 50 MG 24 hr tablet Take 1 tablet (50 mg total) by mouth daily. Take with or immediately following a meal. 02/02/16 02/01/17 Yes Corky Sox, MD  albuterol (PROVENTIL HFA;VENTOLIN HFA) 108 (90 Base) MCG/ACT inhaler Inhale 2 puffs into the lungs every 4 (four) hours as needed for wheezing or shortness of breath (or coughing). [id number]   Delora Fuel, MD  ibuprofen (ADVIL,MOTRIN) 800 MG tablet Take 1 tablet (800 mg total) by mouth every 8 (eight) hours. Patient not taking: Reported on 03/13/2016 02/05/16   Tresea Mall, CNM   BP 160/80 mmHg  Pulse 100  Temp(Src) 98.8 F (37.1 C) (Oral)  Resp 17  Ht 5\' 4"  (1.626 m)  Wt 87.544 kg  BMI 33.11 kg/m2  SpO2 100%  LMP 02/17/2016   Physical Exam  Constitutional: She is oriented to person, place, and time. She appears well-developed and well-nourished. No distress.  HENT:  Head: Normocephalic and atraumatic.  Eyes: Conjunctivae and EOM are normal. No scleral icterus.  Neck: Normal range of motion.  Cardiovascular: Normal rate, regular rhythm and intact distal pulses.   DP and PT pulses 1+ in the LLE  Pulmonary/Chest: Effort normal. No respiratory distress.  Musculoskeletal:       Left foot: There is tenderness and swelling. There is normal capillary refill and no crepitus.       Feet:  Diffuse swelling to L foot without pitting edema. No distinct deformity, but exam limited due to swelling. No crepitus appreciated.  Neurological: She is alert and oriented to person, place, and time. She exhibits normal muscle tone. Coordination normal.  Sensation to light touch intact in the LLE  Skin: Skin is warm and dry. No rash noted. She is not diaphoretic. No erythema. No pallor.  Psychiatric: She has a normal mood and affect. Her behavior is normal.  Nursing note and vitals reviewed.   ED Course  Procedures (including critical care time) Labs Review Labs Reviewed - No data to  display  Imaging Review Dg Ankle Complete Left  03/13/2016  CLINICAL DATA:  Left foot and ankle swelling and pain. Injury on stairs. Diabetes. EXAM: LEFT ANKLE COMPLETE - 3+ VIEW COMPARISON:  None. FINDINGS: Soft tissue swelling that is extensive. No acute fracture subluxation at the ankle. There is disorganized fragmentation and widening at the Chopart joint. IMPRESSION: 1. No acute osseous finding at the ankle. 2. 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 Electronically Signed   By: Monte Fantasia M.D.   On: 03/13/2016 01:15   Ct Foot Left Wo Contrast  03/13/2016  CLINICAL DATA:  Acute onset of left foot pain and swelling. Initial encounter. EXAM: CT OF THE LEFT ANKLE WITHOUT CONTRAST CT OF THE LEFT FOOT WITHOUT CONTRAST TECHNIQUE: Multidetector  CT imaging of the left ankle and foot was performed according to the standard protocol. Multiplanar CT image reconstructions were also generated. COMPARISON:  Left ankle and foot radiographs performed earlier today at 12:42 a.m. FINDINGS: Extensive changes of Charcot joint are again noted at the midfoot, with marked irregularity and partial collapse of the cuneiforms and cuboid, and partial collapse of the bases of the metatarsals. Multiple erosions are noted at the anterior talus. There is medial dislocation of part of the navicular, with diffuse resorption of the remainder of the navicular. Mild erosions are also noted at the anterior calcaneus. The ankle mortise is grossly unremarkable in appearance. The interosseous space is preserved. Marked soft tissue inflammation is noted at the midfoot, with diffuse soft tissue edema tracking about the forefoot and ankle. The flexor and extensor tendons are grossly unremarkable, though difficult to fully assess given the extent of soft tissue inflammation. The peroneal tendons are also difficult fully characterize. IMPRESSION: Extensive changes of Charcot joint again noted at the midfoot, with marked irregularity and partial  collapse of the cuneiforms and cuboid, partial collapse of the bases of the metatarsals, multiple erosions at the anterior talus and calcaneus, and medial dislocation of part of the navicular. Marked surrounding soft inflammation, with edema tracking about the foot and ankle. Electronically Signed   By: Garald Balding M.D.   On: 03/13/2016 03:29   Ct Ankle Left Wo Contrast  03/13/2016  CLINICAL DATA:  Acute onset of left foot pain and swelling. Initial encounter. EXAM: CT OF THE LEFT ANKLE WITHOUT CONTRAST CT OF THE LEFT FOOT WITHOUT CONTRAST TECHNIQUE: Multidetector CT imaging of the left ankle and foot was performed according to the standard protocol. Multiplanar CT image reconstructions were also generated. COMPARISON:  Left ankle and foot radiographs performed earlier today at 12:42 a.m. FINDINGS: Extensive changes of Charcot joint are again noted at the midfoot, with marked irregularity and partial collapse of the cuneiforms and cuboid, and partial collapse of the bases of the metatarsals. Multiple erosions are noted at the anterior talus. There is medial dislocation of part of the navicular, with diffuse resorption of the remainder of the navicular. Mild erosions are also noted at the anterior calcaneus. The ankle mortise is grossly unremarkable in appearance. The interosseous space is preserved. Marked soft tissue inflammation is noted at the midfoot, with diffuse soft tissue edema tracking about the forefoot and ankle. The flexor and extensor tendons are grossly unremarkable, though difficult to fully assess given the extent of soft tissue inflammation. The peroneal tendons are also difficult fully characterize. IMPRESSION: Extensive changes of Charcot joint again noted at the midfoot, with marked irregularity and partial collapse of the cuneiforms and cuboid, partial collapse of the bases of the metatarsals, multiple erosions at the anterior talus and calcaneus, and medial dislocation of part of the  navicular. Marked surrounding soft inflammation, with edema tracking about the foot and ankle. Electronically Signed   By: Garald Balding M.D.   On: 03/13/2016 03:29   Dg Foot Complete Left  03/13/2016  CLINICAL DATA:  Left foot and ankle swelling and pain. Fall on stairs today. Diabetes. EXAM: LEFT FOOT - COMPLETE 3+ VIEW COMPARISON:  None. FINDINGS: There is fragmentation and disorganization of the entire midfoot with sclerosis and regional soft tissue swelling. Chopart joint is essentially disarticulated. Lateral Lisfranc joint is more severely affected than medial. There is a comminuted fracture to the base of the second proximal phalanx with displacement along the articular surface this fracture appears chronic  given margins and mature periosteal reaction along the phalangeal shaft. Chronic third metatarsal head fracture with flattening and sclerosis. No opaque foreign body. IMPRESSION: 1. Charcot midfoot. 2. Chronic appearing displaced intra-articular fracture of the second proximal phalanx. 3. Chronic third metatarsal head fracture. Electronically Signed   By: Monte Fantasia M.D.   On: 03/13/2016 01:19   I have personally reviewed and evaluated these images and lab results as part of my medical decision-making.   EKG Interpretation None       MDM   Final diagnoses:  Foot pain, left    Patient presents for left foot pain after stepping down wrong on some stairs. She is noted to have diffuse swelling to her left foot, though most of this appears chronic. X-ray shows evidence of Charcot midfoot without evidence of acute findings. No old imaging for comparison until 2015. It appears that most of the patient's x-ray findings have been progressive over the past 2 years, but prior x-ray from 2015 is normal. CT ordered for further evaluation. This, again, noted Charcot midfoot as well as irregularity and partial collapse of the cuneiforms and cuboid as well as the base of the metatarsals. There is  some soft tissue inflammation and edema around the foot and ankle. This correlates with the wound for which the patient was being seen by a wound care clinic. No evidence of secondary infection or cellulitis on physical exam of this area today.  Patient is grossly neurovascularly intact. Given lack of evidence of acute process, I do not believe further emergent workup or consultation as indicated. Patient has been given a postop shoe for comfort and Ace wrap has been applied for compression and stabilization. Will refer to orthopedics for outpatient follow-up and further management. Return precautions given at discharge. Patient agreeable to plan with no unaddressed concerns; discharged in satisfactory condition.    Antonietta Breach, PA-C 03/18/16 [id number]  Orpah Greek, MD 03/18/16 [phone]

## 2016-03-13 NOTE — ED Notes (Signed)
Bed: [id number] Expected date:  Expected time:  Means of arrival:  Comments: EMS 40 yo female left leg pain and swelling

## 2016-03-13 NOTE — ED Notes (Signed)
Per EMS pt was going down steps at 2230 and left foot bent upwards then pt began having increasing swelling in left foot/ankle area. Pt reports having recently been released from wound care.

## 2016-03-13 NOTE — ED Notes (Signed)
Pt reports understanding of discharge information. No questions at time of discharge 

## 2016-03-14 ENCOUNTER — Ambulatory Visit: Payer: BLUE CROSS/BLUE SHIELD | Admitting: Adult Health

## 2016-03-14 DIAGNOSIS — I252 Old myocardial infarction: Secondary | ICD-10-CM

## 2016-03-14 HISTORY — PX: TRANSTHORACIC ECHOCARDIOGRAM: SHX275

## 2016-03-14 HISTORY — DX: Old myocardial infarction: I25.2

## 2016-03-14 NOTE — Addendum Note (Signed)
Addended by: Orson Gear on: 03/14/2016 03:38 PM   Modules accepted: Orders

## 2016-03-15 ENCOUNTER — Encounter: Payer: Self-pay | Admitting: Adult Health

## 2016-03-17 ENCOUNTER — Ambulatory Visit (INDEPENDENT_AMBULATORY_CARE_PROVIDER_SITE_OTHER): Payer: BLUE CROSS/BLUE SHIELD | Admitting: Internal Medicine

## 2016-03-17 ENCOUNTER — Encounter: Payer: Self-pay | Admitting: Internal Medicine

## 2016-03-17 VITALS — BP 158/83 | HR 97 | Temp 99.1°F | Ht 65.0 in | Wt 199.2 lb

## 2016-03-17 DIAGNOSIS — M8580 Other specified disorders of bone density and structure, unspecified site: Secondary | ICD-10-CM

## 2016-03-17 DIAGNOSIS — N938 Other specified abnormal uterine and vaginal bleeding: Secondary | ICD-10-CM

## 2016-03-17 DIAGNOSIS — Z794 Long term (current) use of insulin: Secondary | ICD-10-CM

## 2016-03-17 DIAGNOSIS — E11610 Type 2 diabetes mellitus with diabetic neuropathic arthropathy: Secondary | ICD-10-CM

## 2016-03-17 DIAGNOSIS — E1165 Type 2 diabetes mellitus with hyperglycemia: Secondary | ICD-10-CM

## 2016-03-17 DIAGNOSIS — N939 Abnormal uterine and vaginal bleeding, unspecified: Secondary | ICD-10-CM | POA: Insufficient documentation

## 2016-03-17 DIAGNOSIS — I10 Essential (primary) hypertension: Secondary | ICD-10-CM

## 2016-03-17 DIAGNOSIS — E113493 Type 2 diabetes mellitus with severe nonproliferative diabetic retinopathy without macular edema, bilateral: Secondary | ICD-10-CM

## 2016-03-17 DIAGNOSIS — Z79899 Other long term (current) drug therapy: Secondary | ICD-10-CM

## 2016-03-17 LAB — POCT GLYCOSYLATED HEMOGLOBIN (HGB A1C): Hemoglobin A1C: 9.2

## 2016-03-17 LAB — GLUCOSE, CAPILLARY: Glucose-Capillary: 203 mg/dL — ABNORMAL HIGH (ref 65–99)

## 2016-03-17 MED ORDER — GLUCOSE BLOOD VI STRP: ORAL_STRIP | Status: DC

## 2016-03-17 NOTE — Assessment & Plan Note (Signed)
A: Patient taking 15U Novolog 70/30 BID instead of prescribed 18U BID. A1c 9.2 today, versus 10.8 11/2015. She was instructed to take her prescribed dose. Told to bring glucometer next visit after she picks up and uses her testing strips  P: Reorder testing strips

## 2016-03-17 NOTE — Assessment & Plan Note (Signed)
A: Patient's BP is elevated at 158/83, but has been more controlled in general (123/78 four days ago). She did not take Lis-HCTZ today, which may explain the elevation.  P: Continue lis-hztz 20-25 mg

## 2016-03-17 NOTE — Patient Instructions (Signed)
Jane [name],  It was a pleasure meeting you today.  Your left foot is a problem. I highly recommend you see an orthopedist to address this. We have made a referral. Use ice packs and keep it elevated   For your diabetes, we are checking your A1c. We are also prescribing test strips. Please bring your glucometer for your next visit.

## 2016-03-17 NOTE — Assessment & Plan Note (Signed)
A: Hgb BL around 8. Sx much improved on Megace  P: Continue Megace
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
## 2016-03-17 NOTE — Assessment & Plan Note (Signed)
A: Erosions, bony displacements in the setting of uncontrolled T2DM is a concerning new development. It is likely these changes arose since at least October 2016. She is to be non-weight bearing for this Charcot foot. She needs to be evaluated by an orthopedist to what, if any, intervention can be made. The plan was discussed with the patient and she verbalized understanding.  P: Non-Weight Bearing DME Crutches

## 2016-03-18 NOTE — Progress Notes (Signed)
Internal Medicine Clinic Attending  Case discussed with Dr. Ford at the time of the visit.  We reviewed the resident's history and exam and pertinent patient test results.  I agree with the assessment, diagnosis, and plan of care documented in the resident's note.  

## 2016-03-19 ENCOUNTER — Encounter (HOSPITAL_COMMUNITY): Payer: Self-pay | Admitting: *Deleted

## 2016-03-19 ENCOUNTER — Emergency Department (HOSPITAL_COMMUNITY)
Admission: EM | Admit: 2016-03-19 | Discharge: 2016-03-19 | Disposition: A | Discharge status: Home / Self Care | Payer: BLUE CROSS/BLUE SHIELD | Attending: Emergency Medicine | Admitting: Emergency Medicine

## 2016-03-19 DIAGNOSIS — Z87891 Personal history of nicotine dependence: Secondary | ICD-10-CM | POA: Insufficient documentation

## 2016-03-19 DIAGNOSIS — D509 Iron deficiency anemia, unspecified: Secondary | ICD-10-CM | POA: Insufficient documentation

## 2016-03-19 DIAGNOSIS — J45909 Unspecified asthma, uncomplicated: Secondary | ICD-10-CM | POA: Insufficient documentation

## 2016-03-19 DIAGNOSIS — E785 Hyperlipidemia, unspecified: Secondary | ICD-10-CM | POA: Insufficient documentation

## 2016-03-19 DIAGNOSIS — Z8669 Personal history of other diseases of the nervous system and sense organs: Secondary | ICD-10-CM | POA: Insufficient documentation

## 2016-03-19 DIAGNOSIS — E11319 Type 2 diabetes mellitus with unspecified diabetic retinopathy without macular edema: Secondary | ICD-10-CM | POA: Insufficient documentation

## 2016-03-19 DIAGNOSIS — R011 Cardiac murmur, unspecified: Secondary | ICD-10-CM | POA: Insufficient documentation

## 2016-03-19 DIAGNOSIS — Z9114 Patient's other noncompliance with medication regimen: Secondary | ICD-10-CM | POA: Insufficient documentation

## 2016-03-19 DIAGNOSIS — M79672 Pain in left foot: Secondary | ICD-10-CM

## 2016-03-19 DIAGNOSIS — Z79899 Other long term (current) drug therapy: Secondary | ICD-10-CM | POA: Insufficient documentation

## 2016-03-19 DIAGNOSIS — Z8659 Personal history of other mental and behavioral disorders: Secondary | ICD-10-CM | POA: Insufficient documentation

## 2016-03-19 DIAGNOSIS — Z8742 Personal history of other diseases of the female genital tract: Secondary | ICD-10-CM | POA: Insufficient documentation

## 2016-03-19 DIAGNOSIS — Z794 Long term (current) use of insulin: Secondary | ICD-10-CM | POA: Insufficient documentation

## 2016-03-19 DIAGNOSIS — I10 Essential (primary) hypertension: Secondary | ICD-10-CM | POA: Insufficient documentation

## 2016-03-19 DIAGNOSIS — Z8701 Personal history of pneumonia (recurrent): Secondary | ICD-10-CM | POA: Insufficient documentation

## 2016-03-19 NOTE — ED Notes (Signed)
PT was treated on Thursday for ankle pain. Pt received a off load boot at [employer]. Pt reports the left ankle moves side to side. Pt wants crutches because she does not have any.

## 2016-03-19 NOTE — Discharge Instructions (Signed)
Walking Boot A walking boot (controlled ankle motion boot or CAM walker) is a removable boot-shaped splint that holds your foot or ankle in place after an injury or a medical procedure. This helps with healing and prevents further injury. A walking boot has a stiff, rigid outer frame that limits movement and supports your leg and foot. The inner lining is a layer of padded material. Walking boots usually have several adjustable straps to secure them over the foot. Your health care provider may prescribe a walking boot if it is okay for you to use your injured foot to support your body weight. How much you can walk with the boot on will depend on the type and severity of your injury. Your health care provider will recommend the best boot for you based on your condition. HOW DO I PUT ON MY WALKING BOOT? There are different types of walking boots. Each type of boot has specific instructions about how to wear it properly. Follow instructions from your health care provider about wearing yours. In general: Sit down to put on your boot. This is more comfortable and it helps to prevent falls. Open up the boot fully. Place your foot into the boot so that your heel rests against the back. Your toes should be supported by the base of the boot, but they should not hang over the front. Adjust the straps so the boot fits securely but is not too tight. Do not bend the hard frame of the boot to get a good fit. Ask someone to help you put on the boot, if needed. WHAT ARE SOME TIPS FOR WALKING WITH A WALKING BOOT? Do not try to walk without wearing the boot unless your health care provider has approved. Rest your injured leg as much as possible. Use other assistive walking devices as told by your health care provider. These include crutches and canes. On your other foot, wear a shoe with a heel that is close to the height of the boot. Be very careful when walking on surfaces that are uneven or wet. HOW CAN I REDUCE  SWELLING? Rest your injured foot or leg as much as possible. If directed, apply ice to the injured area: Put ice in a plastic bag. Place a towel between your skin and the bag. Leave the ice on for 20 minutes, 2-3 times a day for two days or as told by your health care provider. Keep your injured leg raised (elevated) above the level of your heart for 2-3 hours each day or as told by your health care provider. If swelling gets worse, loosen the boot and rest and raise your foot. Contact your health care provider if swelling does not get better or if it gets worse over time. WHAT SKIN CARE PRACTICES SHOULD I FOLLOW? Wear a long sock to protect your foot and leg from rubbing inside the boot. Take off the boot one time per day to check the injured area. Follow instructions from your health care provider about taking care of your incision or wound, if this applies. Clean and wash the injured area as told by your health care provider. Gently dry your foot and leg before putting the boot back on. Contact your health care provider if a wound is getting worse or if your skin becomes red, painful, or irritated. ARE THERE ANY ACTIVITY RESTRICTIONS? Activity restrictions depend on the type and severity of your injury. Follow instructions from your health care provider. Bathe and shower as directed by your health  care provider. Do not do activities that could make your injury worse. Do not drive if your affected foot is one that you usually use for driving. HOW SHOULD I KEEP MY BOOT CLEAN? Clean the frame and the liner of the boot by hand. Use a washcloth with mild soap and water. Do not use chemical cleaning products. These could irritate your skin, especially if you have a wound or an incision. Do not soak the liner of the boot. Do not put any part of the boot in a washing machine or a clothes dryer. Allow the boot to air dry completely before you put it back on your foot.   This information is not  intended to replace advice given to you by your health care provider. Make sure you discuss any questions you have with your health care provider.   Document Released: 03/17/2015 Document Reviewed: 03/17/2015 Elsevier Interactive Patient Education 2016 [place] Use Crutches are used to take weight off one of your legs or feet when you stand or walk. It is important to use crutches that fit properly. When fitted properly:  Each crutch should be 2-3 finger widths below the armpit.  Your weight should be supported by your hand, and not by resting the armpit on the crutch. RISKS AND COMPLICATIONS Damage to the nerves that extend from your armpit to your hand and arm. To prevent this from happening, make sure your crutches fit properly and do not put pressure on your armpit when using them. HOW TO USE YOUR CRUTCHES If you have been instructed to use partial weight bearing, apply (bear) the amount of weight as your health care provider suggests. Do not bear weight in an amount that causes pain to the area of injury. Walking 1. Step with the crutches. 2. Swing the healthy leg slightly ahead of the crutches. Going Up Steps If there is no handrail: 1. Step up with the healthy leg. 2. Step up with the crutches and injured leg. 3. Continue in this way. If there is a handrail: 1. Hold both crutches in one hand. 2. Place your free hand on the handrail. 3. While putting your weight on your arms, lift your healthy leg to the step. 4. Bring the crutches and the injured leg up to that step. 5. Continue in this way. Going Down Steps Be very careful, as going down stairs with crutches is very challenging. If there is no handrail: 1. Step down with the injured leg and crutches. 2. Step down with the healthy leg. If there is a handrail: 1. Place your hand on the handrail. 2. Hold both crutches with your free hand. 3. Lower your injured leg and crutch to the step below you. Make sure to  keep the crutch tips in the center of the step, never on the edge. 4. Lower your healthy leg to that step. 5. Continue in this way. Standing Up 1. Hold the injured leg forward. 2. Grab the armrest with one hand and the top of the crutches with the other hand. 3. Using these supports, pull yourself up to a standing position. Sitting Down 1. Hold the injured leg forward. 2. Grab the armrest with one hand and the top of the crutches with the other hand. 3. Lower yourself to a sitting position. SEEK MEDICAL CARE IF:  You still feel unsteady on your feet.  You develop new pain, for example in your armpits, back, shoulder, wrist, or hip.  You develop any numbness or tingling. SEEK  IMMEDIATE MEDICAL CARE IF:  You fall.   This information is not intended to replace advice given to you by your health care provider. Make sure you discuss any questions you have with your health care provider.   Document Released: 10/28/2000 Document Revised: 11/21/2014 Document Reviewed: 07/08/2013 Elsevier Interactive Patient Education [employer].

## 2016-03-19 NOTE — ED Notes (Addendum)
Pt reported " I am going to sue [facility] for not doing their job." Pt refusing to ride out in a [facility] per hospital policy ." Pt refused ride to her car by security when offered to her. Pt was escorted to front lobby by security to Pt was able to reach car safely.

## 2016-03-19 NOTE — ED Provider Notes (Signed)
CSN: [id number]     Arrival date & time 03/19/16  1727 History  By signing my name below, I, Hansel Feinstein, attest that this documentation has been prepared under the direction and in the presence of Waynetta Pean, PA-C. Electronically Signed: Hansel Feinstein, ED Scribe. 03/19/2016. 6:08 PM.    Chief Complaint  Patient presents with  . Ankle Pain   The history is provided by the patient. No language interpreter was used.   HPI Comments: Jane [name] is a 40 y.o. female who presents to the Emergency Department complaining of moderate, persistent left ankle pain and swelling onset last week s/p injury. Pt was initially seen in the ED on 03/13/16 for the same complaints, had XR and CT that noted Charcot midfoot as well as irregularity and partial collapse of the cuneiforms and cuboid as well as the base of the metatarsals. She was sent home with a post-op boot and ace wrap. Pt states she is working on f/u with orthopedics, but has not yet been seen. Pt is ambulatory with minimal difficulty, but states her pain is worsened with ambulation and reports a "rocking" sensation. She reports mild to moderate relief of pain with RICE method. She is requesting crutches and ace wrap until she can follow up with ortho. Pt denies fever, weakness.    Past Medical History  Diagnosis Date  . Asthma   . Hyperlipidemia   . Seasonal allergies   . Noncompliance with medications   . Peripheral neuropathy ([place])   . Diabetic retinopathy ([place])   . Iron deficiency anemia   . Menorrhagia   . Hypertension   . Pneumonia "several times"  . Type II diabetes mellitus (HCC)     uncontrolled  . Sickle cell trait ([place])   . Depression   . Heart murmur   . Retinopathy of both eyes    Past Surgical History  Procedure Laterality Date  . Cesarean section  1997  . Tubal ligation  1997   Family History  Problem Relation Age of Onset  . Stroke Mother   . Diabetes Mother   . Hypertension Mother   . Aneurysm Mother   .  Diabetes Father   . Hypertension Father   . Diabetes Maternal Grandmother   . Diabetes Maternal Grandfather   . Cancer Paternal Grandfather     Prostate  . Diabetes Sister   . Hypertension Sister    Social History  Substance Use Topics  . Smoking status: Former Smoker -- 0.25 packs/day for 1 years    Types: Cigarettes    Quit date: 08/29/2015  . Smokeless tobacco: Never Used  . Alcohol Use: No     Comment: 12/23/2015 "last alcohol was ~ Christmas 2016; that was very little alcohol"   OB History    Gravida Para Term Preterm AB TAB SAB Ectopic Multiple Living   2 2 1 1  0 0 0 0 0 2     Review of Systems  Constitutional: Negative for fever.  Musculoskeletal: Positive for joint swelling (left ankle) and arthralgias (left ankle).  Neurological: Negative for weakness.   Allergies  Review of patient's allergies indicates no known allergies.  Home Medications   Prior to Admission medications   Medication Sig Start Date End Date Taking? Authorizing Provider  acetaminophen (TYLENOL) 500 MG tablet Take 1 tablet (500 mg total) by mouth every 6 (six) hours as needed for headache. Patient taking differently: Take 1,000 mg by mouth every 6 (six) hours as needed for headache.  01/25/16   Tasrif Ahmed, MD  albuterol (PROVENTIL HFA;VENTOLIN HFA) 108 (90 Base) MCG/ACT inhaler Inhale 2 puffs into the lungs every 4 (four) hours as needed for wheezing or shortness of breath (or coughing). [id number]   Delora Fuel, MD  aspirin EC 81 MG tablet Take 1 tablet (81 mg total) by mouth daily. 01/25/16 01/24/17  Tasrif Ahmed, MD  atorvastatin (LIPITOR) 80 MG tablet Take 1 tablet (80 mg total) by mouth daily. 01/01/16 12/31/16  Juluis Mire, MD  ferrous sulfate 325 (65 FE) MG tablet Take 325 mg by mouth daily with breakfast.    Historical Provider, MD  glucose blood test strip Use as instructed 03/17/16   Liberty Handy, MD  ibuprofen (ADVIL,MOTRIN) 800 MG tablet Take 1 tablet (800 mg total) by mouth every 8 (eight)  hours. Patient not taking: Reported on 03/13/2016 02/05/16   Tresea Mall, CNM  insulin aspart protamine- aspart (NOVOLOG MIX 70/30) (70-30) 100 UNIT/ML injection Inject 0.18 mLs (18 Units total) into the skin 2 (two) times daily with a meal. Patient taking differently: Inject 15 Units into the skin 2 (two) times daily with a meal.  01/01/16   Juluis Mire, MD  lisinopril-hydrochlorothiazide (PRINZIDE,ZESTORETIC) 20-25 MG tablet Take 1 tablet by mouth daily. 02/19/16   Juluis Mire, MD  Megestrol Acetate (MEGACE ES PO) Take 1 tablet by mouth 2 (two) times daily. Patient states she takes an unknown dose of this medication twice daily last dose 03/12/2016    Historical Provider, MD  metoprolol succinate (TOPROL XL) 50 MG 24 hr tablet Take 1 tablet (50 mg total) by mouth daily. Take with or immediately following a meal. 02/02/16 02/01/17  Corky Sox, MD   BP 135/75 mmHg  Pulse 84  Temp(Src) 98.4 F (36.9 C) (Oral)  Resp 16  Ht 5\' 5"  (1.651 m)  Wt 87.544 kg  BMI 32.12 kg/m2  SpO2 100%  LMP 02/17/2016 Physical Exam  Constitutional: She appears well-developed and well-nourished. No distress.  HENT:  Head: Normocephalic and atraumatic.  Eyes: Right eye exhibits no discharge. Left eye exhibits no discharge.  Cardiovascular: Normal rate, regular rhythm and intact distal pulses.   Pulmonary/Chest: Effort normal. No respiratory distress.  Musculoskeletal: She exhibits edema.  Moderate soft tissue edema to the left foot, which hinders exam. No evidence of soft tissue infection. No erythema. Good capillary refill to left distal toes.   Neurological: She is alert. Coordination normal.   Good Capillary refill. Sensation intact to light touch to Left lower extremity.   Skin: Skin is warm and dry. No rash noted. She is not diaphoretic. No erythema. No pallor.  Psychiatric: She has a normal mood and affect. Her behavior is normal.  Nursing note and vitals reviewed.   ED Course  Procedures  (including critical care time) DIAGNOSTIC STUDIES: Oxygen Saturation is 100% on RA, normal by my interpretation.    COORDINATION OF CARE: 6:01 PM Discussed treatment plan with pt at bedside which includes crutches, orthopedic f/u and pt agreed to plan.   MDM   Meds given in ED:  Medications - No data to display  New Prescriptions   No medications on file      Final diagnoses:  Left foot pain   This is a 40 y.o. female who presents to the Emergency Department complaining of moderate, persistent left ankle pain and swelling onset last week s/p injury. Pt was initially seen in the ED on 03/13/16 for the same complaints, had XR and CT that noted Charcot  midfoot as well as irregularity and partial collapse of the cuneiforms and cuboid as well as the base of the metatarsals. She was sent home with a post-op boot and ace wrap. Pt states she is working on f/u with orthopedics, but has not yet been seen. Pt is ambulatory with minimal difficulty, but states her pain is worsened with ambulation and reports a "rocking" sensation. She reports mild to moderate relief of pain with RICE method. She is requesting crutches and ace wrap until she can follow up with ortho. Pt advised to follow up with orthopedics as scheduled. Patient given crutches while in ED, conservative therapy recommended and discussed. Advised pt continue with RICE method and post-op boot. Patient will be discharged home & is agreeable with above plan. Returns precautions discussed. Pt appears safe for discharge.   I personally performed the services described in this documentation, which was scribed in my presence. The recorded information has been reviewed and is accurate.      Waynetta Pean, PA-C 03/19/16 1819  Lajean Saver, MD 03/20/16 [phone]

## 2016-03-25 ENCOUNTER — Encounter: Payer: Self-pay | Admitting: Internal Medicine

## 2016-03-25 ENCOUNTER — Ambulatory Visit (INDEPENDENT_AMBULATORY_CARE_PROVIDER_SITE_OTHER): Payer: Self-pay | Admitting: Internal Medicine

## 2016-03-25 ENCOUNTER — Encounter: Payer: Self-pay | Admitting: Licensed Clinical Social Worker

## 2016-03-25 ENCOUNTER — Other Ambulatory Visit: Payer: Self-pay | Admitting: Dietician

## 2016-03-25 VITALS — BP 103/62 | HR 87 | Temp 98.5°F | Ht 65.0 in | Wt 191.2 lb

## 2016-03-25 DIAGNOSIS — Z794 Long term (current) use of insulin: Secondary | ICD-10-CM

## 2016-03-25 DIAGNOSIS — E11610 Type 2 diabetes mellitus with diabetic neuropathic arthropathy: Secondary | ICD-10-CM

## 2016-03-25 DIAGNOSIS — E113493 Type 2 diabetes mellitus with severe nonproliferative diabetic retinopathy without macular edema, bilateral: Secondary | ICD-10-CM

## 2016-03-25 DIAGNOSIS — E1165 Type 2 diabetes mellitus with hyperglycemia: Secondary | ICD-10-CM

## 2016-03-25 LAB — GLUCOSE, CAPILLARY: Glucose-Capillary: 317 mg/dL — ABNORMAL HIGH (ref 65–99)

## 2016-03-25 MED ORDER — LANCETS 30G MISC: Status: DC

## 2016-03-25 MED ORDER — "INSULIN SYRINGE-NEEDLE U-100 31G X 15/64"" 0.3 ML MISC": Status: DC

## 2016-03-25 MED ORDER — METFORMIN HCL 500 MG PO TABS: 500.0000 mg | ORAL_TABLET | Freq: Two times a day (BID) | ORAL | Status: DC

## 2016-03-25 MED ORDER — GLUCOSE BLOOD VI STRP: ORAL_STRIP | Status: DC

## 2016-03-25 NOTE — Patient Instructions (Signed)
Jane [name],  It was a pleasure seeing you again.  We are starting metformin 500 mg twice a day. Also, be sure to take 18U Novolog 70/30 twice a day. BRING YOU GLUCOSE METER!  Please follow up with the orthopedist.  We'll see you in 2 weeks.

## 2016-03-25 NOTE — Telephone Encounter (Signed)
Called to follow up with patient- she saw orthopedics and they put on a hard cast  That will get changed in 2 weeks and gave her a prescription for a scooter. She has to change jobs at work to stay off her foot. To trol her blood sugars she needs test strips and syringes. She has an  [employer] Orthoptist. Diabetes educator offered to give her sample syringes and look into a scooter from PT and what strips are covered by her insurance. She will be here this afternoon for her follow up appointment

## 2016-03-25 NOTE — Progress Notes (Signed)
   Subjective:    Patient ID: Jane [name], female    DOB: [birth year], 40 y.o.   MRN: [id number]  HPI  Jane [name] is a 40 year old woman with a PMH of uncontrolled T2DM with severe nonproliferative retinopathy, dysfunctional uterine bleeding, and obesity to come to the clinic to discuss her T2DM and recently diagnosed Charcot foot. With respect to her diabetes, she has been taking Novolog 70/30 15U BID despite being prescribed 18U BID. She says there is no particular reason for this, as there have been no recent episodes of symptomatic hypoglycemia. She has not been using her glucose testing strips. She said she had difficulty with metformin in the past, although she cannot recall what the issue was. She is agreeable to trying it again.   For her Charcot foot, she is being followed by an orthopedist who has been replacing her hard cast bi-weekly. She has appropriately remained non-weight bearing on the affected left foot. She previously worked an [employer] on [address] [employer], requiring hours of standing and walking. Due to her injury, she has been unable to perform her primary work duties. Unfortunately, because she cannot currently perform this work, she has not been paid and her health insurance lapsed at this critical time. 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 She does not report any significant improvement in her symptoms since the last clinic visit 2 weeks ago.    Review of Systems  Constitutional: Negative for fever and chills.  Respiratory: Negative for cough and shortness of breath.   Cardiovascular: Positive for leg swelling. Negative for chest pain and palpitations.  Endocrine: Negative for polydipsia, polyphagia and polyuria.  Musculoskeletal: Positive for joint swelling, arthralgias and gait problem. Negative for back pain.       Objective:     Physical Exam  Constitutional: She appears well-developed. No distress.  Crutches by side.   Cardiovascular: Normal rate, regular rhythm and normal heart sounds.   Pulmonary/Chest: Effort normal and breath sounds normal. No respiratory distress. She has no wheezes.  Abdominal: Soft. Bowel sounds are normal. She exhibits no distension. There is no tenderness.  Obese.  Musculoskeletal: She exhibits no edema.  Left foot in hard cast.  Neurological: She is alert. She has normal reflexes.  Skin: She is not diaphoretic.  Psychiatric: She has a normal mood and affect. Her behavior is normal.  Vitals reviewed.         Assessment & Plan:   Please see problem based assessment and plan for details.

## 2016-03-25 NOTE — Telephone Encounter (Signed)
Called [facility] to request assistance with coverage for test strips. They asked for a prescription with names of test strips that might be preferred to run through

## 2016-03-27 NOTE — Assessment & Plan Note (Signed)
A: Random BG in the 300s today. She is not taking her Novolog 70/30 as prescribed. Told her to take 18U BID rather than 15U BID. She is also agreeable to starting metformin.  P: Novolog 70/30 18U BID Metformin 500 mg BID

## 2016-03-27 NOTE — Assessment & Plan Note (Signed)
A: It is likely her left Charcot foot was present 08/2015, but was difficult to diagnose until 03/2016 due to lack of radiographic evidence. If it was known, she should have been non-weight bearing on the left foot during that time; however, continuing to walk on it at work likely worsened her condition and she may require a prolonged recovery time. This is unfortunate given her work requires her to work long hours on her feet and to walk. She is informing Production designer, theatre/television/film of her restrictions, and hopefully, they will provide her a desk job or other work that does not require standing or walking so that she can provide food and shelter for herself and her family. Unfortunately, her situation has led her to not be able to afford health insurance. She is working with our Education officer, museum, Alvie Heidelberg, to help her with finding resources.  Given her obesity, she is at risk of developing Charcot foot in the opposite foot. Therefore, if she has new symptoms from the right foot, she may require a wheelchair.   P: F/u with orthopedics Fill out FMLA paperwork today

## 2016-03-28 NOTE — Progress Notes (Signed)
Internal Medicine Clinic Attending  Case discussed with Dr. Ford at the time of the visit.  We reviewed the resident's history and exam and pertinent patient test results.  I agree with the assessment, diagnosis, and plan of care documented in the resident's note.  

## 2016-03-29 ENCOUNTER — Inpatient Hospital Stay (HOSPITAL_COMMUNITY)
Admission: EM | Admit: 2016-03-29 | Discharge: 2016-04-02 | DRG: 249 | Disposition: A | Discharge status: Home / Self Care | Payer: Self-pay | Attending: Internal Medicine | Admitting: Internal Medicine

## 2016-03-29 ENCOUNTER — Encounter (HOSPITAL_COMMUNITY): Payer: Self-pay | Admitting: *Deleted

## 2016-03-29 ENCOUNTER — Emergency Department (HOSPITAL_COMMUNITY): Payer: Self-pay

## 2016-03-29 DIAGNOSIS — N939 Abnormal uterine and vaginal bleeding, unspecified: Secondary | ICD-10-CM | POA: Diagnosis present

## 2016-03-29 DIAGNOSIS — I25110 Atherosclerotic heart disease of native coronary artery with unstable angina pectoris: Principal | ICD-10-CM | POA: Diagnosis present

## 2016-03-29 DIAGNOSIS — E1151 Type 2 diabetes mellitus with diabetic peripheral angiopathy without gangrene: Secondary | ICD-10-CM | POA: Diagnosis present

## 2016-03-29 DIAGNOSIS — J45909 Unspecified asthma, uncomplicated: Secondary | ICD-10-CM | POA: Diagnosis present

## 2016-03-29 DIAGNOSIS — E1121 Type 2 diabetes mellitus with diabetic nephropathy: Secondary | ICD-10-CM | POA: Diagnosis present

## 2016-03-29 DIAGNOSIS — Z794 Long term (current) use of insulin: Secondary | ICD-10-CM

## 2016-03-29 DIAGNOSIS — I208 Other forms of angina pectoris: Secondary | ICD-10-CM | POA: Diagnosis present

## 2016-03-29 DIAGNOSIS — I10 Essential (primary) hypertension: Secondary | ICD-10-CM | POA: Diagnosis present

## 2016-03-29 DIAGNOSIS — Z87891 Personal history of nicotine dependence: Secondary | ICD-10-CM

## 2016-03-29 DIAGNOSIS — Z9114 Patient's other noncompliance with medication regimen: Secondary | ICD-10-CM

## 2016-03-29 DIAGNOSIS — E860 Dehydration: Secondary | ICD-10-CM | POA: Diagnosis present

## 2016-03-29 DIAGNOSIS — N179 Acute kidney failure, unspecified: Secondary | ICD-10-CM | POA: Diagnosis present

## 2016-03-29 DIAGNOSIS — E11610 Type 2 diabetes mellitus with diabetic neuropathic arthropathy: Secondary | ICD-10-CM | POA: Diagnosis present

## 2016-03-29 DIAGNOSIS — D509 Iron deficiency anemia, unspecified: Secondary | ICD-10-CM | POA: Diagnosis present

## 2016-03-29 DIAGNOSIS — E785 Hyperlipidemia, unspecified: Secondary | ICD-10-CM | POA: Diagnosis present

## 2016-03-29 DIAGNOSIS — N183 Chronic kidney disease, stage 3 (moderate): Secondary | ICD-10-CM | POA: Diagnosis present

## 2016-03-29 DIAGNOSIS — I129 Hypertensive chronic kidney disease with stage 1 through stage 4 chronic kidney disease, or unspecified chronic kidney disease: Secondary | ICD-10-CM | POA: Diagnosis present

## 2016-03-29 DIAGNOSIS — I2089 Other forms of angina pectoris: Secondary | ICD-10-CM | POA: Diagnosis present

## 2016-03-29 DIAGNOSIS — I200 Unstable angina: Secondary | ICD-10-CM | POA: Diagnosis present

## 2016-03-29 DIAGNOSIS — Z79899 Other long term (current) drug therapy: Secondary | ICD-10-CM

## 2016-03-29 DIAGNOSIS — E113413 Type 2 diabetes mellitus with severe nonproliferative diabetic retinopathy with macular edema, bilateral: Secondary | ICD-10-CM | POA: Diagnosis present

## 2016-03-29 DIAGNOSIS — E1165 Type 2 diabetes mellitus with hyperglycemia: Secondary | ICD-10-CM | POA: Diagnosis present

## 2016-03-29 DIAGNOSIS — Z7982 Long term (current) use of aspirin: Secondary | ICD-10-CM

## 2016-03-29 DIAGNOSIS — R079 Chest pain, unspecified: Secondary | ICD-10-CM

## 2016-03-29 DIAGNOSIS — Z955 Presence of coronary angioplasty implant and graft: Secondary | ICD-10-CM

## 2016-03-29 DIAGNOSIS — E113493 Type 2 diabetes mellitus with severe nonproliferative diabetic retinopathy without macular edema, bilateral: Secondary | ICD-10-CM

## 2016-03-29 DIAGNOSIS — E1122 Type 2 diabetes mellitus with diabetic chronic kidney disease: Secondary | ICD-10-CM | POA: Diagnosis present

## 2016-03-29 DIAGNOSIS — F329 Major depressive disorder, single episode, unspecified: Secondary | ICD-10-CM | POA: Diagnosis present

## 2016-03-29 DIAGNOSIS — D573 Sickle-cell trait: Secondary | ICD-10-CM | POA: Diagnosis present

## 2016-03-29 DIAGNOSIS — R9439 Abnormal result of other cardiovascular function study: Secondary | ICD-10-CM

## 2016-03-29 DIAGNOSIS — M14672 Charcot's joint, left ankle and foot: Secondary | ICD-10-CM | POA: Diagnosis present

## 2016-03-29 DIAGNOSIS — E1169 Type 2 diabetes mellitus with other specified complication: Secondary | ICD-10-CM | POA: Diagnosis present

## 2016-03-29 DIAGNOSIS — I951 Orthostatic hypotension: Secondary | ICD-10-CM | POA: Diagnosis present

## 2016-03-29 DIAGNOSIS — Z7984 Long term (current) use of oral hypoglycemic drugs: Secondary | ICD-10-CM

## 2016-03-29 HISTORY — DX: Coronary angioplasty status: Z98.61

## 2016-03-29 HISTORY — DX: Atherosclerotic heart disease of native coronary artery without angina pectoris: I25.10

## 2016-03-29 LAB — BASIC METABOLIC PANEL
Anion gap: 6 (ref 5–15)
BUN: 35 mg/dL — AB (ref 6–20)
CHLORIDE: 107 mmol/L (ref 101–111)
CO2: 24 mmol/L (ref 22–32)
CREATININE: 2 mg/dL — AB (ref 0.44–1.00)
Calcium: 9.1 mg/dL (ref 8.9–10.3)
GFR calc Af Amer: 35 mL/min — ABNORMAL LOW (ref >60)
GFR calc non Af Amer: 30 mL/min — ABNORMAL LOW (ref >60)
GLUCOSE: 144 mg/dL — AB (ref 65–99)
Potassium: 4.3 mmol/L (ref 3.5–5.1)
Sodium: 137 mmol/L (ref 135–145)

## 2016-03-29 LAB — CBC
HCT: 29.1 % — ABNORMAL LOW (ref 36.0–46.0)
Hemoglobin: 9.3 g/dL — ABNORMAL LOW (ref 12.0–15.0)
MCH: 25.3 pg — AB (ref 26.0–34.0)
MCHC: 32 g/dL (ref 30.0–36.0)
MCV: 79.3 fL (ref 78.0–100.0)
PLATELETS: 300 10*3/uL (ref 150–400)
RBC: 3.67 MIL/uL — ABNORMAL LOW (ref 3.87–5.11)
RDW: 18.2 % — AB (ref 11.5–15.5)
WBC: 6.3 10*3/uL (ref 4.0–10.5)

## 2016-03-29 LAB — I-STAT TROPONIN, ED: Troponin i, poc: 0 ng/mL (ref 0.00–0.08)

## 2016-03-29 LAB — TROPONIN I

## 2016-03-29 LAB — GLUCOSE, CAPILLARY: GLUCOSE-CAPILLARY: 116 mg/dL — AB (ref 65–99)

## 2016-03-29 MED ORDER — ALBUTEROL SULFATE (2.5 MG/3ML) 0.083% IN NEBU: 2.5000 mg | INHALATION_SOLUTION | RESPIRATORY_TRACT | Status: DC | PRN

## 2016-03-29 MED ORDER — CAPSAICIN 0.075 % EX CREA
TOPICAL_CREAM | Freq: Three times a day (TID) | CUTANEOUS | Status: DC
  Filled 2016-03-29: qty 60

## 2016-03-29 MED ORDER — METOPROLOL SUCCINATE ER 50 MG PO TB24
50.0000 mg | ORAL_TABLET | Freq: Every day | ORAL | Status: DC
  Administered 2016-03-30 – 2016-04-02 (×4): 50 mg via ORAL
  Filled 2016-03-29 (×4): qty 1

## 2016-03-29 MED ORDER — FERROUS SULFATE 325 (65 FE) MG PO TABS
325.0000 mg | ORAL_TABLET | Freq: Every day | ORAL | Status: DC
  Administered 2016-03-30 – 2016-04-02 (×4): 325 mg via ORAL
  Filled 2016-03-29 (×3): qty 1

## 2016-03-29 MED ORDER — ACETAMINOPHEN 325 MG PO TABS: 650.0000 mg | ORAL_TABLET | Freq: Four times a day (QID) | ORAL | Status: DC | PRN

## 2016-03-29 MED ORDER — ONDANSETRON HCL 4 MG/2ML IJ SOLN
4.0000 mg | Freq: Four times a day (QID) | INTRAMUSCULAR | Status: DC | PRN
  Administered 2016-03-31: 4 mg via INTRAVENOUS
  Filled 2016-03-29: qty 2

## 2016-03-29 MED ORDER — ONDANSETRON HCL 4 MG PO TABS: 4.0000 mg | ORAL_TABLET | Freq: Four times a day (QID) | ORAL | Status: DC | PRN

## 2016-03-29 MED ORDER — SODIUM CHLORIDE 0.9 % IV BOLUS (SEPSIS)
1000.0000 mL | Freq: Once | INTRAVENOUS | Status: AC
  Administered 2016-03-29: 1000 mL via INTRAVENOUS

## 2016-03-29 MED ORDER — SODIUM CHLORIDE 0.9% FLUSH
3.0000 mL | Freq: Two times a day (BID) | INTRAVENOUS | Status: DC
  Administered 2016-03-30 – 2016-04-01 (×3): 3 mL via INTRAVENOUS

## 2016-03-29 MED ORDER — INSULIN GLARGINE 100 UNIT/ML ~~LOC~~ SOLN
7.0000 [IU] | Freq: Every day | SUBCUTANEOUS | Status: DC
  Administered 2016-03-30 – 2016-04-01 (×4): 7 [IU] via SUBCUTANEOUS
  Filled 2016-03-29 (×6): qty 0.07

## 2016-03-29 MED ORDER — ASPIRIN EC 81 MG PO TBEC
81.0000 mg | DELAYED_RELEASE_TABLET | Freq: Every day | ORAL | Status: DC
  Administered 2016-03-30 – 2016-03-31 (×2): 81 mg via ORAL
  Filled 2016-03-29 (×2): qty 1

## 2016-03-29 MED ORDER — CAPSAICIN 0.025 % EX CREA
TOPICAL_CREAM | Freq: Three times a day (TID) | CUTANEOUS | Status: DC
  Administered 2016-03-30 (×2): via TOPICAL
  Administered 2016-03-30: 1 via TOPICAL
  Administered 2016-03-31 – 2016-04-01 (×2): via TOPICAL
  Filled 2016-03-29 (×2): qty 56.6

## 2016-03-29 MED ORDER — SODIUM CHLORIDE 0.45 % IV SOLN
INTRAVENOUS | Status: DC
  Administered 2016-03-30 – 2016-04-01 (×4): via INTRAVENOUS

## 2016-03-29 MED ORDER — HEPARIN SODIUM (PORCINE) 5000 UNIT/ML IJ SOLN
5000.0000 [IU] | Freq: Three times a day (TID) | INTRAMUSCULAR | Status: DC
  Administered 2016-03-30 – 2016-03-31 (×4): 5000 [IU] via SUBCUTANEOUS
  Filled 2016-03-29 (×4): qty 1

## 2016-03-29 MED ORDER — INSULIN ASPART 100 UNIT/ML ~~LOC~~ SOLN: 0.0000 [IU] | Freq: Every day | SUBCUTANEOUS | Status: DC

## 2016-03-29 MED ORDER — INSULIN ASPART 100 UNIT/ML ~~LOC~~ SOLN
0.0000 [IU] | Freq: Three times a day (TID) | SUBCUTANEOUS | Status: DC
  Administered 2016-03-30: 1 [IU] via SUBCUTANEOUS
  Administered 2016-03-30: 3 [IU] via SUBCUTANEOUS
  Administered 2016-03-31 (×2): 1 [IU] via SUBCUTANEOUS
  Administered 2016-04-01 – 2016-04-02 (×4): 2 [IU] via SUBCUTANEOUS
  Administered 2016-04-02: 06:00:00 3 [IU] via SUBCUTANEOUS

## 2016-03-29 NOTE — Progress Notes (Signed)
CSW met with [name] following her scheduled [facility] appointment.  Pt is currently not able to work her current position and has not had a chance to speak with Human Resources at her employer to obtain any documentation or request for potential reassignment.  Since [name] has not been working, pt has been without income and unable pay rent or her insurance premiums.  [name] states she has not been able to work since earlier this year.  Pt applied for disability, but was denied indicating the possibility to work in alternate ways.  Pt is appealing this decision.   [name] states her primary goal at this time is to obtain housing.  CSW informed pt waitlist for [facility] is currently open and encouraged pt to apply.  CSW informed pt of the need to have an email address and internet to apply.  Pt referred to Jane [name] for access.  In addition, CSW informed [name] to speak with Congregational Nurse at [facility] for potential housing assistance and [facility] for assistance completing online application.  Shelter listings provided as well. CSW encouraged [name] to meet with [place] Counselor, Jane [name] to work on [employer] application as not to interrupt access to care.  Pt aware she will need documentation from [place] that her coverage is no longer active.  CSW inquired if pt has applied for Medicaid based on outstanding medical expenses.  Pt unaware thinking since disability was denied she would also been denied.  CSW encouraged [name] to contact patient accounting to inquire if she has an outstanding balance at all of her provider offices.  CSW provided pt with the address and contact number to DSS Adult Medicaid. The following information was provided to [name]: Information on [employer], [employer] and [employer].  [place] address and contact number for applying  for Medicaid with current medical expenses. And, information on [employer]ocational [employer] as a resource for additional assistance finding employment.  Pt provided with CSW contact information and is aware CSW is available as needed.  Pt denies add'l needs at this time.

## 2016-03-29 NOTE — ED Notes (Signed)
Pt in via [name], per report pt c/o Mid CP onset today while at [employer], pt reports dizziness & weakness onset for 1-2 x mth, pt denies n/v/d, pt A&O x4, pt presents to ED with L leg splint

## 2016-03-29 NOTE — H&P (Signed)
Date: 03/29/2016               Patient Name:  Jane [name] MRN: [id number]  DOB: [birth year] Age / Sex: 40 y.o., female   PCP: Juluis Mire, MD         Medical Service: Internal Medicine Teaching Service         Attending Physician: Dr. Bartholomew Crews, MD    First Contact: Dr. Blane Ohara Pager: [phone]  Second Contact: Dr. Jacques Earthly Pager: [phone]       After Hours (After 5p/  First Contact Pager: [phone]  weekends / holidays): Second Contact Pager: [phone]   Chief Complaint: Chest pain  History of Present Illness:  [name] is a 40 year old female with PMH of Insulin dependent T2DM, HTN, PAD, HLD, Abnormal Uterine bleeding, Iron deficiency, and history of tobacco use who presents with complaint of chest pain. She says she was outside yesterday around 2:45 pm, when she began to feel diaphoretic, nauseous, and dizzy, which she thought was related to the heat. She says she has not had good oral intake the last couple of days due to feeling unwell. She states that she has been under a lot of stress lately due to financial and insurance issues. She was at social workers office earlier today to discuss insurance options, when she began to feel a tight/pressure-like substernal chest pain, rated 9/10 in intensity, without radiation to the arms, neck, jaw, or back. She reports associated nausea and diaphoresis, but no shortness of breath. Her pain was on and off for approximately 10-15 minutes. EMS were called and by the time they arrived, her pain had resolved. She did not receive sublingual nitroglycerin. She says her pain can be reproduced with palpation.   She says she has had chest pain in the past related to cough from pneumonia, but nothing similar to what she experienced today. Patient reports that for the past 1-2 months she has had intermittent episodes of dizziness, nausea, diaphoresis, and confusion. She reports these seemed to be related to her uterine  bleeding, which are apparently improving after she was started on Megace to control her bleeding. She also was recently diagnosed with left sided Charcot foot which was likely present for some time until this month due to lack of radiographic evidence. She had a cast placed on her left foot last week. She denies any history of blood clots in the legs or lungs or family history of the same.  Social Hx: Former smoker of 3 years (~1 pack over 3-4 days, quit in Oct 2016), occasional alcohol with last reported drink in February, former marijuana use, denies other illicit drug use  Family Hx: Maternal uncle died of heart attack, DM throughout maternal relatives, HTN  Meds: Current Facility-Administered Medications  Medication Dose Route Frequency Provider Last Rate Last Dose  . acetaminophen (TYLENOL) tablet 650 mg  650 mg Oral Q6H PRN Zada Finders, MD       Current Outpatient Prescriptions  Medication Sig Dispense Refill  . acetaminophen (TYLENOL) 500 MG tablet Take 1 tablet (500 mg total) by mouth every 6 (six) hours as needed for headache. (Patient taking differently: Take 1,000 mg by mouth every 6 (six) hours as needed for headache. ) 30 tablet 0  . albuterol (PROVENTIL HFA;VENTOLIN HFA) 108 (90 Base) MCG/ACT inhaler Inhale 2 puffs into the lungs every 4 (four) hours as needed for wheezing or shortness of breath (or coughing). 1 Inhaler 2  . aspirin  EC 81 MG tablet Take 1 tablet (81 mg total) by mouth daily. 150 tablet 2  . ferrous sulfate 325 (65 FE) MG tablet Take 325 mg by mouth daily with breakfast.    . ibuprofen (ADVIL,MOTRIN) 800 MG tablet Take 1 tablet (800 mg total) by mouth every 8 (eight) hours. 30 tablet 0  . insulin aspart protamine- aspart (NOVOLOG MIX 70/30) (70-30) 100 UNIT/ML injection Inject 0.18 mLs (18 Units total) into the skin 2 (two) times daily with a meal. (Patient taking differently: Inject 15 Units into the skin 2 (two) times daily with a meal. ) 10 mL 11  .  lisinopril-hydrochlorothiazide (PRINZIDE,ZESTORETIC) 20-25 MG tablet Take 1 tablet by mouth daily. 30 tablet 3  . Megestrol Acetate (MEGACE ES PO) Take 1 tablet by mouth 2 (two) times daily. Patient states she takes an unknown dose of this medication twice daily last dose 03/12/2016    . metFORMIN (GLUCOPHAGE) 500 MG tablet Take 1 tablet (500 mg total) by mouth 2 (two) times daily with a meal. 30 tablet 3  . metoprolol succinate (TOPROL XL) 50 MG 24 hr tablet Take 1 tablet (50 mg total) by mouth daily. Take with or immediately following a meal. 30 tablet 5    Allergies: Allergies as of 03/29/2016  . (No Known Allergies)   Past Medical History  Diagnosis Date  . Asthma   . Hyperlipidemia   . Seasonal allergies   . Noncompliance with medications   . Peripheral neuropathy ([place])   . Diabetic retinopathy ([place])   . Iron deficiency anemia   . Menorrhagia   . Hypertension   . Pneumonia "several times"  . Type II diabetes mellitus (HCC)     uncontrolled  . Sickle cell trait ([place])   . Depression   . Heart murmur   . Retinopathy of both eyes    Past Surgical History  Procedure Laterality Date  . Cesarean section  1997  . Tubal ligation  1997   Family History  Problem Relation Age of Onset  . Stroke Mother   . Diabetes Mother   . Hypertension Mother   . Aneurysm Mother   . Diabetes Father   . Hypertension Father   . Diabetes Maternal Grandmother   . Diabetes Maternal Grandfather   . Cancer Paternal Grandfather     Prostate  . Diabetes Sister   . Hypertension Sister    Social History   Social History  . Marital Status: Single    Spouse Name: N/A  . Number of Children: 2  . Years of Education: 13   Occupational History  .   [employer]    Social History Main Topics  . Smoking status: Former Smoker -- 0.25 packs/day for 1 years    Types: Cigarettes    Quit date: 08/29/2015  . Smokeless tobacco: Never Used  . Alcohol Use: No     Comment: 12/23/2015 "last alcohol was ~  Christmas 2016; that was very little alcohol"  . Drug Use: No     Comment: 12/23/2015 "used to smoke marijuana; quit 04/2013"  . Sexual Activity: Yes    Birth Control/ Protection: Surgical   Other Topics Concern  . Not on file   Social History Narrative   Patient does not drink caffeine.   Patient is right handed.     Review of Systems: Review of Systems  Constitutional: Positive for chills, malaise/fatigue and diaphoresis. Negative for fever.  HENT: Negative for congestion and sore throat.   Eyes: Positive  for blurred vision.  Respiratory: Negative for cough, hemoptysis, sputum production, shortness of breath and wheezing.   Cardiovascular: Positive for chest pain. Negative for palpitations, orthopnea and claudication.       Chronic left lower extremity swelling  Gastrointestinal: Positive for nausea. Negative for heartburn, vomiting, diarrhea, constipation, blood in stool and melena.  Genitourinary: Negative for dysuria and hematuria.  Musculoskeletal: Negative for myalgias and back pain.  Neurological: Positive for dizziness and headaches. Negative for sensory change and loss of consciousness.       Chronic numbness/tingling in fingers  Psychiatric/Behavioral: Negative for substance abuse.     Physical Exam: Blood pressure 181/93, pulse 99, temperature 98.6 F (37 C), temperature source Oral, resp. rate 21, height 5\' 5"  (1.651 m), weight 191 lb (86.637 kg), last menstrual period 03/25/2016, SpO2 100 %. Physical Exam  Constitutional: She is oriented to person, place, and time. She appears well-developed and well-nourished. No distress.  HENT:  Head: Normocephalic and atraumatic.  Eyes: EOM are normal.  Neck: Normal range of motion.  Cardiovascular: Normal rate and regular rhythm.  Exam reveals no gallop and no friction rub.   No murmur heard. Pulmonary/Chest: Effort normal. No respiratory distress. She has no wheezes. She has no rales. She exhibits tenderness.  Chest tender  to palpation over left breast near left sternum  Abdominal: Soft. Bowel sounds are normal. She exhibits no distension. There is no tenderness.  Musculoskeletal: Normal range of motion. She exhibits no edema.  Not tenderness, erythema, or swelling of posterior lower extremities. Left foot in cast.  Neurological: She is alert and oriented to person, place, and time.  Skin: Skin is warm. She is not diaphoretic.  Psychiatric: She has a normal mood and affect.     Lab results: Basic Metabolic Panel:  Recent Labs  03/29/16 1541  NA 137  K 4.3  CL 107  CO2 24  GLUCOSE 144*  BUN 35*  CREATININE 2.00*  CALCIUM 9.1   Liver Function Tests: No results for input(s): AST, ALT, ALKPHOS, BILITOT, PROT, ALBUMIN in the last 72 hours. No results for input(s): LIPASE, AMYLASE in the last 72 hours. No results for input(s): AMMONIA in the last 72 hours. CBC:  Recent Labs  03/29/16 1541  WBC 6.3  HGB 9.3*  HCT 29.1*  MCV 79.3  PLT 300   Cardiac Enzymes: No results for input(s): CKTOTAL, CKMB, CKMBINDEX, TROPONINI in the last 72 hours. BNP: No results for input(s): PROBNP in the last 72 hours. D-Dimer: No results for input(s): DDIMER in the last 72 hours. CBG: No results for input(s): GLUCAP in the last 72 hours. Hemoglobin A1C: No results for input(s): HGBA1C in the last 72 hours. Fasting Lipid Panel: No results for input(s): CHOL, HDL, LDLCALC, TRIG, CHOLHDL, LDLDIRECT in the last 72 hours. Thyroid Function Tests: No results for input(s): TSH, T4TOTAL, FREET4, T3FREE, THYROIDAB in the last 72 hours. Anemia Panel: No results for input(s): VITAMINB12, FOLATE, FERRITIN, TIBC, IRON, RETICCTPCT in the last 72 hours. Coagulation: No results for input(s): LABPROT, INR in the last 72 hours. Urine Drug Screen: Drugs of Abuse  No results found for: LABOPIA, COCAINSCRNUR, LABBENZ, AMPHETMU, THCU, LABBARB  Alcohol Level: No results for input(s): ETH in the last 72 hours. Urinalysis: No  results for input(s): COLORURINE, LABSPEC, PHURINE, GLUCOSEU, HGBUR, BILIRUBINUR, KETONESUR, PROTEINUR, UROBILINOGEN, NITRITE, LEUKOCYTESUR in the last 72 hours.  Invalid input(s): APPERANCEUR   Imaging results:  Dg Chest 2 View  03/29/2016  CLINICAL DATA:  Chest pain for 1  day EXAM: CHEST  2 VIEW COMPARISON:  01/03/2016 FINDINGS: Cardiac shadow is within normal limits. The lungs are clear bilaterally. No acute bony abnormality is noted. IMPRESSION: No active cardiopulmonary disease. Electronically Signed   By: Inez Catalina M.D.   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  Chest pain: Patient with substernal chest pain at rest that was relieved on its own after 10-15 minutes. She does not report prior anginal type chest pain. Her history is a mixed picture of typical and non-specific symptoms with new inferior lead q-waves warranting admission for ACS rule-out. Initial point-of-care Troponin is negative. Patient has reproducible chest tenderness with palpation which suggest a musculoskeletal pain could be the cause of her current symptoms. She does report increased stress and onset of pain when discussing her financial and insurance situations with social workers, which may indicate a stress or anxiety component as well. Suspicion for pulmonary embolism is low, and symtoms/exam are not consistent with aortic dissection, esophageal rupture, or pneumothorax. We will admit patient for observation and  chest pain rule and trend troponins overnight. Stress test may be considered in the future. -Trend troponins -Repeat EKG in AM -Cardiac monitoring -Aspirin 81 mg -Was prescribed high-intensity Atorvastatin previously, unclear whether she is still taking this, will need on discharge  AKI: Patient with increased SCr of 2.00. Last month, SCr was 1.81, with previous baseline around 1.10. She has recently had urine studies showing proteinuria and hematuria. She does report diaphoresis, nausea, and decreased oral intake after being in the heat yesterday which may suggest that her AKI is related to dehydration. Her proteinuria is likely secondary to her uncontrolled T2DM and HTN, hematuria may have been from abnormal uterine bleeding. She has also taken Ibuprofen at home which may have contributed. Patient received 1 L NS bolus in the ED and will be continued on maintenance IVF. We will repeat a UA and also collect a urine pregnancy test in this patient of child-bearing age. -NS @ 125 mL/hr -UA, urine pregnancy -Consider Renal U/S if SCr not improving with IVF resuscitation -Orthostatic vitals -Zofran prn   HTN: Patient reports not taking her blood pressure medications today and has been out of her Lisinopril-HCTZ. BPs elevated to 123456 systolic in the ED, improved to 140s/80s. -Hold home Lisinopril and HCTZ in setting of AKI -Continue home Metoprolol 50 mg daily  T2DM: Takes Novolog 70/30 15 units BID with meals and Metformin 500 mg BID at home. -Lantus 7 units BID -SSI-S  Iron deficiency Anemia related to Hx Uterine Bleeding: Patient with recent history of abnormal uterine bleeding, follows at [facility]. Recently started on Megace to control bleeding with subjective improvement in her bleeding as well as symptomatic anemia. Hgb currently 9.3 improved from 7.8-8.3 one month ago. No reported recent bleeding. -Hold Megace -Continue ferrous sulfate  Diet: Heart  DVT ppx: Heparin  Code:  FULL   Dispo: Disposition is deferred at this time, awaiting improvement of current medical problems. Anticipated discharge in approximately  1-2 day(s).   The patient does have a current PCP (Juluis Mire, MD) and does need an [facility] hospital follow-up appointment after discharge.  The patient does not have transportation limitations that hinder transportation to clinic appointments.  Signed: Zada Finders, MD 03/29/2016, 10:21 PM

## 2016-03-29 NOTE — ED Provider Notes (Signed)
CSN: [id number]     Arrival date & time 03/29/16  1527 History   First MD Initiated Contact with Patient 03/29/16 1945     Chief Complaint  Patient presents with  . Chest Pain    HPI [name] is a 40 year old lady with poorly-controlled insulin-dependent type 2 diabetes, hypertension, and history of tobacco abuse here with chest pain.  While sitting at [employer] earlier today receiving stressful news, she felt a 10/10 substernal chest pressure with associated diaphoresis and nausea. The pain eased up after about 30 minutes but she still feels some vague discomfort that is tender to the touch. She thinks she may have pulled a muscle because she is on crutches for a broken right foot. She started Megace about a month ago but denies any shortness of breath or lower extremity swelling worse than her baseline.  Past Medical History  Diagnosis Date  . Asthma   . Hyperlipidemia   . Seasonal allergies   . Noncompliance with medications   . Peripheral neuropathy ([place])   . Diabetic retinopathy ([place])   . Iron deficiency anemia   . Menorrhagia   . Hypertension   . Pneumonia "several times"  . Type II diabetes mellitus (HCC)     uncontrolled  . Sickle cell trait ([place])   . Depression   . Heart murmur   . Retinopathy of both eyes    Past Surgical History  Procedure Laterality Date  . Cesarean section  1997  . Tubal ligation  1997   Family History  Problem Relation Age of Onset  . Stroke Mother   . Diabetes Mother   . Hypertension Mother   . Aneurysm Mother   . Diabetes Father   . Hypertension Father   . Diabetes Maternal Grandmother   . Diabetes Maternal Grandfather   . Cancer Paternal Grandfather     Prostate  . Diabetes Sister   . Hypertension Sister    Social History  Substance Use Topics  . Smoking status: Former Smoker -- 0.25 packs/day for 1 years    Types: Cigarettes    Quit date: 08/29/2015  . Smokeless tobacco: Never Used  . Alcohol Use: No     Comment:  12/23/2015 "last alcohol was ~ Christmas 2016; that was very little alcohol"   OB History    Gravida Para Term Preterm AB TAB SAB Ectopic Multiple Living   2 2 1 1  0 0 0 0 0 2     Review of Systems  Constitutional: Positive for diaphoresis. Negative for fever and chills.  Respiratory: Positive for chest tightness. Negative for choking, shortness of breath and wheezing.   Cardiovascular: Positive for chest pain. Negative for palpitations and leg swelling.  Gastrointestinal: Negative for abdominal pain.  Skin: Negative for rash.    Allergies  Review of patient's allergies indicates no known allergies.  Home Medications   Prior to Admission medications   Medication Sig Start Date End Date Taking? Authorizing Provider  acetaminophen (TYLENOL) 500 MG tablet Take 1 tablet (500 mg total) by mouth every 6 (six) hours as needed for headache. Patient taking differently: Take 1,000 mg by mouth every 6 (six) hours as needed for headache.  01/25/16  Yes Tasrif Ahmed, MD  albuterol (PROVENTIL HFA;VENTOLIN HFA) 108 (90 Base) MCG/ACT inhaler Inhale 2 puffs into the lungs every 4 (four) hours as needed for wheezing or shortness of breath (or coughing). [id number]  Yes Delora Fuel, MD  aspirin EC 81 MG tablet Take 1  tablet (81 mg total) by mouth daily. 01/25/16 01/24/17 Yes Tasrif Ahmed, MD  ferrous sulfate 325 (65 FE) MG tablet Take 325 mg by mouth daily with breakfast.   Yes Historical Provider, MD  ibuprofen (ADVIL,MOTRIN) 800 MG tablet Take 1 tablet (800 mg total) by mouth every 8 (eight) hours. 02/05/16  Yes Tresea Mall, CNM  insulin aspart protamine- aspart (NOVOLOG MIX 70/30) (70-30) 100 UNIT/ML injection Inject 0.18 mLs (18 Units total) into the skin 2 (two) times daily with a meal. Patient taking differently: Inject 15 Units into the skin 2 (two) times daily with a meal.  01/01/16  Yes Marjan Rabbani, MD  lisinopril-hydrochlorothiazide (PRINZIDE,ZESTORETIC) 20-25 MG tablet Take 1 tablet by mouth  daily. 02/19/16  Yes Juluis Mire, MD  Megestrol Acetate (MEGACE ES PO) Take 1 tablet by mouth 2 (two) times daily. Patient states she takes an unknown dose of this medication twice daily last dose 03/12/2016   Yes Historical Provider, MD  metFORMIN (GLUCOPHAGE) 500 MG tablet Take 1 tablet (500 mg total) by mouth 2 (two) times daily with a meal. 03/25/16 03/25/17 Yes Liberty Handy, MD  metoprolol succinate (TOPROL XL) 50 MG 24 hr tablet Take 1 tablet (50 mg total) by mouth daily. Take with or immediately following a meal. 02/02/16 02/01/17 Yes Corky Sox, MD   BP 149/89 mmHg  Pulse 92  Temp(Src) 98.6 F (37 C) (Oral)  Resp 13  Ht 5\' 5"  (1.651 m)  Wt 86.637 kg  BMI 31.78 kg/m2  SpO2 100%  LMP 03/25/2016 Physical Exam  Constitutional: She is oriented to person, place, and time. She appears well-developed and well-nourished.  HENT:  Head: Normocephalic and atraumatic.  Eyes: Conjunctivae are normal. Pupils are equal, round, and reactive to light.  Neck: Normal range of motion.  Cardiovascular: Normal rate, regular rhythm and normal heart sounds.   Sternum is tender to palpation  Pulmonary/Chest: Effort normal and breath sounds normal.  Abdominal: Soft. Bowel sounds are normal.  Musculoskeletal: Normal range of motion.  Neurological: She is alert and oriented to person, place, and time.  Skin: Skin is warm and dry.  Psychiatric: She has a normal mood and affect. Her behavior is normal.   ED Course  Procedures (including critical care time) Labs Review Labs Reviewed  BASIC METABOLIC PANEL - Abnormal; Notable for the following:    Glucose, Bld 144 (*)    BUN 35 (*)    Creatinine, Ser 2.00 (*)    GFR calc non Af Amer 30 (*)    GFR calc Af Amer 35 (*)    All other components within normal limits  CBC - Abnormal; Notable for the following:    RBC 3.67 (*)    Hemoglobin 9.3 (*)    HCT 29.1 (*)    MCH 25.3 (*)    RDW 18.2 (*)    All other components within normal limits  I-STAT  TROPOININ, ED    Imaging Review Dg Chest 2 View  03/29/2016  CLINICAL DATA:  Chest pain for 1 day EXAM: CHEST  2 VIEW COMPARISON:  01/03/2016 FINDINGS: Cardiac shadow is within normal limits. The lungs are clear bilaterally. No acute bony abnormality is noted. IMPRESSION: No active cardiopulmonary disease. Electronically Signed   By: Inez Catalina M.D.   On: 03/29/2016 16:10   I have personally reviewed and evaluated these images and lab results as part of my medical decision-making.  EKG: normal sinus rhythm, normal axis, new Q waves in III and aVf, otherwise  no ischemic changes  MDM   Final diagnoses:  Atypical angina [facility])   Ms. Fauerbach is a 40 year old lady with poorly-controlled insulin-dependent type 2 diabetes, hypertension, and history of tobacco abuse here with angina with both typical and atypical features. Because she described typical features such as substernal chest pressure with diaphoresis, she has numerous risk factors, and she has new Q waves in her inferior leads, I think troponins should be trended overnight. This could very well be simple muskuloskeletal pain because she is tender to palpation, but I think a stress test would be prudent some time in the near future. She also has an acute kidney injury so I've bolused her 1L and started maintenance fluids thereafter.  Loleta Chance, MD 03/29/16 2025  Loleta Chance, MD 03/29/16 [id number]  Leonard Schwartz, MD 03/29/16 2053

## 2016-03-30 ENCOUNTER — Observation Stay (HOSPITAL_COMMUNITY): Payer: MEDICAID

## 2016-03-30 DIAGNOSIS — I10 Essential (primary) hypertension: Secondary | ICD-10-CM

## 2016-03-30 DIAGNOSIS — Z794 Long term (current) use of insulin: Secondary | ICD-10-CM

## 2016-03-30 DIAGNOSIS — E785 Hyperlipidemia, unspecified: Secondary | ICD-10-CM

## 2016-03-30 DIAGNOSIS — R0789 Other chest pain: Secondary | ICD-10-CM

## 2016-03-30 DIAGNOSIS — R079 Chest pain, unspecified: Secondary | ICD-10-CM

## 2016-03-30 DIAGNOSIS — E083413 Diabetes mellitus due to underlying condition with severe nonproliferative diabetic retinopathy with macular edema, bilateral: Secondary | ICD-10-CM

## 2016-03-30 DIAGNOSIS — R011 Cardiac murmur, unspecified: Secondary | ICD-10-CM

## 2016-03-30 DIAGNOSIS — I208 Other forms of angina pectoris: Secondary | ICD-10-CM

## 2016-03-30 LAB — NM MYOCAR MULTI W/SPECT W/WALL MOTION / EF
CHL CUP NUCLEAR SSS: 6
CHL CUP STRESS STAGE 1 DBP: 104 mmHg
CHL CUP STRESS STAGE 1 GRADE: 0 %
CHL CUP STRESS STAGE 1 HR: 104 {beats}/min
CHL CUP STRESS STAGE 1 SBP: 197 mmHg
CHL CUP STRESS STAGE 1 SPEED: 0 mph
CHL CUP STRESS STAGE 2 GRADE: 0 %
CHL CUP STRESS STAGE 2 HR: 104 {beats}/min
CHL CUP STRESS STAGE 3 SBP: 141 mmHg
CHL CUP STRESS STAGE 4 DBP: 78 mmHg
CHL CUP STRESS STAGE 4 SPEED: 0 mph
CSEPPBP: 148 mmHg
CSEPPHR: 105 {beats}/min
Estimated workload: 1 METS
Exercise duration (min): 5 min
LHR: 0.27
LV dias vol: 78 mL (ref 46–106)
LVSYSVOL: 48 mL
NUC STRESS TID: 1.22
Percent of predicted max HR: 58 %
Rest HR: 101 {beats}/min
SDS: 6
SRS: 0
Stage 2 Speed: 0 mph
Stage 3 DBP: 60 mmHg
Stage 3 Grade: 0 %
Stage 3 HR: 107 {beats}/min
Stage 3 Speed: 0 mph
Stage 4 Grade: 0 %
Stage 4 HR: 105 {beats}/min
Stage 4 SBP: 148 mmHg

## 2016-03-30 LAB — GLUCOSE, CAPILLARY
GLUCOSE-CAPILLARY: 148 mg/dL — AB (ref 65–99)
GLUCOSE-CAPILLARY: 248 mg/dL — AB (ref 65–99)
Glucose-Capillary: 194 mg/dL — ABNORMAL HIGH (ref 65–99)

## 2016-03-30 LAB — ECHOCARDIOGRAM COMPLETE
Height: 65 in
Weight: 3046.4 oz

## 2016-03-30 LAB — URINALYSIS, ROUTINE W REFLEX MICROSCOPIC
BILIRUBIN URINE: NEGATIVE
GLUCOSE, UA: 100 mg/dL — AB
KETONES UR: NEGATIVE mg/dL
Leukocytes, UA: NEGATIVE
NITRITE: NEGATIVE
PH: 5.5 (ref 5.0–8.0)
Protein, ur: >300 mg/dL — AB
Specific Gravity, Urine: 1.02 (ref 1.005–1.030)

## 2016-03-30 LAB — BASIC METABOLIC PANEL
Anion gap: 8 (ref 5–15)
BUN: 34 mg/dL — ABNORMAL HIGH (ref 6–20)
CALCIUM: 8.5 mg/dL — AB (ref 8.9–10.3)
CO2: 24 mmol/L (ref 22–32)
CREATININE: 1.57 mg/dL — AB (ref 0.44–1.00)
Chloride: 108 mmol/L (ref 101–111)
GFR, EST AFRICAN AMERICAN: 46 mL/min — AB (ref >60)
GFR, EST NON AFRICAN AMERICAN: 40 mL/min — AB (ref >60)
Glucose, Bld: 257 mg/dL — ABNORMAL HIGH (ref 65–99)
Potassium: 4.3 mmol/L (ref 3.5–5.1)
Sodium: 140 mmol/L (ref 135–145)

## 2016-03-30 LAB — URINE MICROSCOPIC-ADD ON

## 2016-03-30 LAB — PREGNANCY, URINE: Preg Test, Ur: NEGATIVE

## 2016-03-30 LAB — TROPONIN I
Troponin I: <0.03 ng/mL (ref <0.031)
Troponin I: <0.03 ng/mL (ref <0.031)

## 2016-03-30 MED ORDER — TECHNETIUM TC 99M TETROFOSMIN IV KIT
30.0000 | PACK | Freq: Once | INTRAVENOUS | Status: AC | PRN
  Administered 2016-03-30: 30 via INTRAVENOUS

## 2016-03-30 MED ORDER — TECHNETIUM TC 99M TETROFOSMIN IV KIT
10.0000 | PACK | Freq: Once | INTRAVENOUS | Status: AC | PRN
  Administered 2016-03-30: 10 via INTRAVENOUS

## 2016-03-30 MED ORDER — REGADENOSON 0.4 MG/5ML IV SOLN
INTRAVENOUS | Status: AC
  Filled 2016-03-30: qty 5

## 2016-03-30 MED ORDER — SODIUM CHLORIDE 0.9 % IV BOLUS (SEPSIS)
1000.0000 mL | Freq: Once | INTRAVENOUS | Status: AC
  Administered 2016-03-30: 1000 mL via INTRAVENOUS

## 2016-03-30 MED ORDER — REGADENOSON 0.4 MG/5ML IV SOLN
0.4000 mg | Freq: Once | INTRAVENOUS | Status: AC
  Administered 2016-03-30: 0.4 mg via INTRAVENOUS
  Filled 2016-03-30: qty 5

## 2016-03-30 NOTE — Progress Notes (Signed)
Echocardiogram 2D Echocardiogram has been performed.  Aggie Cosier 03/30/2016, 3:05 PM

## 2016-03-30 NOTE — Progress Notes (Signed)
Inpatient Diabetes Program Recommendations  AACE/ADA: New Consensus Statement on Inpatient Glycemic Control (2015)  Target Ranges:  Prepandial:   less than 140 mg/dL      Peak postprandial:   less than 180 mg/dL (1-2 hours)      Critically ill patients:  140 - 180 mg/dL  Results for KARLY, REUSS (MRN [id number]) as of 03/30/2016 12:51  Ref. Range 03/04/2016 05:01 03/17/2016 10:19 03/25/2016 13:53 03/29/2016 22:43 03/30/2016 07:49  Glucose-Capillary Latest Ref Range: 65-99 mg/dL 191 (H) 203 (H) 317 (H) 116 (H) 148 (H)   Review of Glycemic Control  Diabetes history: DM 2 Outpatient Diabetes medications: 70/30 insulin 15 units bid ac meals + Metformin 500 mg bid Current orders for Inpatient glycemic control: Lantus 7 units q d + Novolog correction 0-9 units tid + 0-5 units hs  Inpatient Diabetes Program Recommendations:  Noted A1c 9.2. 50% of home basal would equal 10 units Lantus. Please consider diet to include carb modified. Will follow.   Thank you, Nani Gasser. Sequoya Hogsett, RN, MSN, CDE Inpatient Glycemic Control Team Team Pager [phone] (8am-5pm) 03/30/2016 12:55 PM \

## 2016-03-30 NOTE — Progress Notes (Signed)
Subjective: Jane [name] feels much better this morning. She denies any residual chest pain/pressure initially, but had a little discomfort when leaning up and moving on our physical exam this morning without any other symptoms. She is concerned about her lightheadedness and nausea when she first stands up, but otherwise has no shortness of breath, PND/orthopnea, N/V/D, urinary symptoms, worsening leg swelling, or other issues at this time.  Objective: Vital signs in last 24 hours: Filed Vitals:   03/29/16 2100 03/29/16 2200 03/29/16 2244 03/30/16 0529  BP: 146/86 181/93 166/93 98/65  Pulse: 97 99 101 86  Temp:   98.2 F (36.8 C) 98.3 F (36.8 C)  TempSrc:   Oral Oral  Resp: 20 21  18   Height:   5\' 5"  (1.651 m)   Weight:   174 lb 6.4 oz (79.107 kg) 190 lb 6.4 oz (86.365 kg)  SpO2: 100% 100% 100% 100%     Gen: Well-appearing, alert and oriented to person, place, and time HEENT: Oropharynx clear without erythema or exudate.  Neck: No cervical LAD, no thyromegaly or nodules, no JVD noted. CV: Normal rate, regular rhythm, soft 1/6 blowing systolic murmur at LSB that is non-radiating, rubs, or gallops. Pain reproducible on palpation on L sternum. Pulmonary: Normal effort, CTA bilaterally, no crackles or wheezes Abdominal: Soft, non-tender, non-distended, without rebound, guarding, or masses Extremities: Distal pulses 2+ in upper and lower extremities bilaterally, no tenderness, erythema or edema. L foot in cast. Skin: No atypical appearing moles. No rashes  Lab Results: Basic Metabolic Panel:  Recent Labs Lab 03/29/16 1541 03/30/16 0324  NA 137 140  K 4.3 4.3  CL 107 108  CO2 24 24  GLUCOSE 144* 257*  BUN 35* 34*  CREATININE 2.00* 1.57*  CALCIUM 9.1 8.5*   CBC:  Recent Labs Lab 03/29/16 1541  WBC 6.3  HGB 9.3*  HCT 29.1*  MCV 79.3  PLT 300   Cardiac Enzymes:  Recent Labs Lab 03/29/16 2127 03/30/16 0324 03/30/16 0851  TROPONINI <0.03 <0.03 <0.03    Assessment/Plan: 1. Atypical chest pain in a moderate CV risk patient - EKG with borderline Q waves vs normal EKG, troponins negative x 3, CXR clear but in patient with extensive RFs. Her current presentation does seem more consistent with musculoligamentous strain than cardiac pathology, although she is at high risk for underlying CV disease. -Cardiology following; appreciate recs. Scheduled for Myoview today -Would not order a TTE for a systolic murmur alone but in the presence of dizziness/lightheadedness as well as atypical chest pain, it is reasonable to do while here -Resume atorvastatin on d/c (was previously on but not recently)  2. Orthostatic hypotension - symptoms more c/w orthostatic hypotension from dehydration + her medications than a cardiac etiology. Her systolic murmur is almost certainly a benign flow murmur due to her anemia so I do not think these two are related. Positive orthostatics here and AKI as well to further support the diagnosis. Anemia and poor PO intake could make these symptoms worse as well. -Continue IVF; additional bolus this morning -F/u BMPs. AKI improving with IVF here -Holding home lisinopril-HCTZ. Continuing home metoprolol. -Could consider d'c HCTZ, start on lisinopril alone (for T2DM as well) and add on amlodipine to see if this improves her symptoms along with more aggressive oral hydration  Dispo: Disposition is deferred at this time, awaiting improvement of current medical problems.  Anticipated discharge in approximately 1 day(s).   The patient does have a current PCP (Juluis Mire, MD) and  does need an [facility] hospital follow-up appointment after discharge.  The patient does not have transportation limitations that hinder transportation to clinic appointments.    Norval Gable, MD 03/30/2016, 11:24 AM

## 2016-03-30 NOTE — Consult Note (Signed)
Cardiology Consult    Patient ID: Jane [name] MRN: [id number], DOB/AGE: 1976-10-01   Admit date: 03/29/2016 Date of Consult: 03/30/2016  Primary Physician: Juluis Mire, MD Primary Cardiologist: New Requesting Provider: Pat[facility]/ IM  Patient Profile    40 yo female with PMH of HTN/HLD/DM II/asthma/abnormal uterine bleeding/Iron deficiency anemia/Charcot Foot and tobacco use who presented to the ED with chest pain 03/29/2016.  Past Medical History   Past Medical History  Diagnosis Date  . Asthma   . Hyperlipidemia   . Seasonal allergies   . Noncompliance with medications   . Peripheral neuropathy ([place])   . Diabetic retinopathy ([place])   . Iron deficiency anemia   . Menorrhagia   . Hypertension   . Pneumonia "several times"  . Type II diabetes mellitus ([facility])     uncontrolled  . Sickle cell trait ([place])   . Depression   . Heart murmur   . Retinopathy of both eyes     Past Surgical History  Procedure Laterality Date  . Cesarean section  1997  . Tubal ligation  1997     Allergies  No Known Allergies  History of Present Illness    40 yo female with PMH of HTN/HLD/DM II/asthma/abnormal uterine bleeding/Iron deficiency anemia/Charcot foot and tobacco use who is new to cardiology. Reports she is normal seen at the Internal Medicine clinic for her chronic management. States she has a hard time filling her medications due to financial reasons and has been out of her blood pressure medications for the past couple of days.   Reports over the past couple of months, she's been having intermittent episodes of dizziness, light-headedness, and fleeting chest pain that comes and nonspecific times, including activity and rest. Yesterday, she woke up not feeling well, feeling slightly dizzy. During her visit to the unemployment office around 2:45pm, she was sitting in the chair, talking with a representative, when she began to feel dizzy, diaphoretic, and nauseated. She became  concerned when she developed centralized chest pain/pressure which she reports was a 9/10. Employees at the unemployment office opted to call EMS at this time. On EMS arrival, the chest pain had abated, but she requested to be transported to the ED for further work up. She did not receive any nitroglycerin via EMS.   On arrival to the ED, her labs showed a Cr 2.00, Hgb 9.3, and POC trop 0.03. Chest x-ray was negative, EKG showed SR Rate-70s with acute ST/T wave abnormalities, but Q wave in the inferior leads. She was given ASA and IV fluids for the elevated Cr. Admitted to Internal Medicine.   Cardiology has been consulted related patient c/o chest pain.   Inpatient Medications    . aspirin EC  81 mg Oral Daily  . capsaicin   Topical TID WC & HS  . ferrous sulfate  325 mg Oral Q breakfast  . heparin  5,000 Units Subcutaneous Q8H  . insulin aspart  0-5 Units Subcutaneous QHS  . insulin aspart  0-9 Units Subcutaneous TID WC  . insulin glargine  7 Units Subcutaneous QHS  . metoprolol succinate  50 mg Oral Daily  . sodium chloride flush  3 mL Intravenous Q12H    Family History    Family History  Problem Relation Age of Onset  . Stroke Mother   . Diabetes Mother   . Hypertension Mother   . Aneurysm Mother   . Diabetes Father   . Hypertension Father   . Diabetes Maternal Grandmother   .  Diabetes Maternal Grandfather   . Cancer Paternal Grandfather     Prostate  . Diabetes Sister   . Hypertension Sister     Social History    Social History   Social History  . Marital Status: Single    Spouse Name: N/A  . Number of Children: 2  . Years of Education: 13   Occupational History  .   [employer]    Social History Main Topics  . Smoking status: Former Smoker -- 0.25 packs/day for 1 years    Types: Cigarettes    Quit date: 08/29/2015  . Smokeless tobacco: Never Used  . Alcohol Use: No     Comment: 12/23/2015 "last alcohol was ~ Christmas 2016; that was very little alcohol"    . Drug Use: No     Comment: 12/23/2015 "used to smoke marijuana; quit 04/2013"  . Sexual Activity: Yes    Birth Control/ Protection: Surgical   Other Topics Concern  . Not on file   Social History Narrative   Patient does not drink caffeine.   Patient is right handed.      Review of Systems    General:  No chills, fever, night sweats or weight changes, + diaphoresis Cardiovascular:  + chest pain, dyspnea on exertion, edema, orthopnea, palpitations, paroxysmal nocturnal dyspnea. Dermatological: No rash, lesions/masses Respiratory: No cough, dyspnea Urologic: No hematuria, dysuria Abdominal:   + nausea, vomiting, diarrhea, bright red blood per rectum, melena, or hematemesis Neurologic:  + visual changes, wkns, changes in mental status, +dizziness, +light-headedness Musk:+ tender with palpation to the left chest wall All other systems reviewed and are otherwise negative except as noted above.  Physical Exam    Blood pressure 98/65, pulse 86, temperature 98.3 F (36.8 C), temperature source Oral, resp. rate 18, height 5\' 5"  (1.651 m), weight 190 lb 6.4 oz (86.365 kg), last menstrual period 03/25/2016, SpO2 100 %.  General: Pleasant AA female, NAD Psych: Normal affect. Neuro: Alert and oriented X 3. Moves all extremities spontaneously. HEENT: Normal  Neck: Supple without bruits or JVD. Lungs:  Resp regular and unlabored, CTA. Heart: RRR no s3, s4, 1/6 systolic murmur.  Abdomen: Soft, non-tender, non-distended, BS + x 4.  Extremities: No clubbing, cyanosis or edema. DP/PT/Radials 2+. Cast on the left lower leg.   Labs    Troponin [facility] Test)  Recent Labs  03/29/16 1611  TROPIPOC 0.00    Recent Labs  03/29/16 2127 03/30/16 0324  TROPONINI <0.03 <0.03   Lab Results  Component Value Date   WBC 6.3 03/29/2016   HGB 9.3* 03/29/2016   HCT 29.1* 03/29/2016   MCV 79.3 03/29/2016   PLT 300 03/29/2016    Recent Labs Lab 03/30/16 0324  NA 140  K 4.3  CL 108   CO2 24  BUN 34*  CREATININE 1.57*  CALCIUM 8.5*  GLUCOSE 257*   Lab Results  Component Value Date   CHOL 243* 06/09/2015   HDL 65 06/09/2015   LDLCALC 146* 06/09/2015   TRIG 158* 06/09/2015   Lab Results  Component Value Date   DDIMER 1.78* 08/29/2015     Radiology Studies    Dg Chest 2 View  03/29/2016  CLINICAL DATA:  Chest pain for 1 day EXAM: CHEST  2 VIEW COMPARISON:  01/03/2016 FINDINGS: Cardiac shadow is within normal limits. The lungs are clear bilaterally. No acute bony abnormality is noted. IMPRESSION: No active cardiopulmonary disease. Electronically Signed   By: Linus Mako.D.  On: 03/29/2016 16:10   ECG & Cardiac Imaging    EKG: 03/30/2016 SR-92 No change from previous EKG  Assessment & Plan    1. Chest pain with moderate risk: She reports intermittent episodes of dizziness, light-headedness, nausea and chest pain over the past couple of months. Yesterday experienced one of these episodes while sitting at the unemployment office talking with a representative. Reports not feeling well that morning, and developing centralized chest pain at the office, prompting the staff to call EMS. Noted Cr 2.0, Hgb 9.3 (which is around her baseline), and POC trop neg, with enzymes cycled negative x3. Chest x-ray was clear. --EKG showed SR Rate-70s with acute ST/T wave abnormalities, but Q wave in the inferior leads --She does present with personal risk factors, along with a family history of heart disease. Also, reports being inconsistent with medical therapy at times due to financial strain.  --Does have chest wall pain with palpation, but given her risk factors I think a stress test would be beneficial.  2. Dizziness: Reports this seems to be worse with position changes, particularly from sitting to standing, I think this could be secondary to dehydration. States she has also had poor PO intake recently --Does have a 1/6 systolic murmur on exam, which she reports hx of same.  Has never had a 2D echo in the past. Will order for this admission.  --Had positive orthostatics this morning --Home medications include HCTZ, stressed the importance adequate fluid intake with taking this medication --Currently held given her AKI  3. AKI: Cr elevated at 2.0 with improvement to 1.57 with IV fluids. Likely due to dehydration.  --Lisinopril, HCTZ and meformin held  4. HTN: Mostly controlled. Continue to monitor.   5. HLD: Last lipid panel with inadequate control. Was previously prescribed statin, but is not currently taking at home.  --Will need to resume  6. DM II: Home medications held --SSI and Lantus   Signed, Reino Bellis, NP-C Pager [phone] 03/30/2016, 7:46 AM  I have seen, examined and evaluated the patient this Am along with Ms. Mancel Bale, NP-C in consultation for Chest Pain..  After reviewing all the available data and chart, we discussed the findings and recommendations. I agree with her findings, examination as well as impression recommendations.   This is a 40 year old woman with diabetes, hypertension and hyperlipidemia and former smoker who now presents with strange episode of nausea and lightheadedness associated with chest discomfort that has been intermittent over last few months. Yesterday to warrant her coming to the hospital. She ruled out for an MI.  Her symptoms are somewhat difficult but otherwise atypical for angina overall. Bili don't have much a sense as to any exertional differences. I suspect with her rapid heartbeats and irregular sensation in her chest that she may have been somewhat dehydrated and hypotensive being on HCTZ. She was clearly orthostatic this morning.  This point I'm little concerned about her chest discomfort that this is at least moderate risk for cardiac etiology and informs evaluation. She cannot walk on treadmill because of her Charcot foot will therefore have to do a Lexiscan Myoview to exclude coronary  disease.  Very soft murmur. I do agree with checking an echocardiogram just to make sure there may be a soft rub on exam as well. This will help exclude pericardial effusion pericarditis.  Renal function is improved with hydration.  - Would DC HCTZ - Lexiscan Myoview along with 2-D echocardiogram - Continue statin - Continue current management of diabetes  We  will follow the results of the studies and provide more recommendations pending the results.    Glenetta Hew, M.D., M.S. Interventional Cardiologist   Pager # [phone] Phone # [phone] [address] [place], [place] [address]

## 2016-03-30 NOTE — Progress Notes (Signed)
    Patient presented to [place] for stress test. Tolerated well. Results pending.  Reino Bellis NP-C Pager [phone]

## 2016-03-30 NOTE — Plan of Care (Signed)
Problem: Consults Goal: Chest Pain Patient Education (See Patient Education module for education specifics.) Outcome: Progressing Patient admitted with chest pain, dehydration and acute kidney injury. Chest pain was reproducible with palpation, non-radiating and not associated with other symptoms on arrival to floor. Chest pain has resolved (patient sleeping) after application of capsaicin ointment per order.  Patient has not urinated since arriving to unit; need sample for U/A and urine pregnancy test.  Per her report, patient has not been eating or drinking well recently due to feeling ill; nausea, dizziness, weakness with mobility and standing. Appears mildly dehydrated on assessment. Hydrating with 1/2 NS at 1mg /kg/hr (125 ml/hr) after initial 1 L bolus of NS given in ED.  Orthostatic vital signs obtained were positive: Orthostatic VS for the past 24 hrs (Last 3 readings):   BP- Lying Pulse- Lying BP- Sitting Pulse- Sitting BP- Standing at 0 minutes Pulse- Standing at 0 minutes BP- Standing at 3 minutes Pulse- Standing at 3 minutes  03/29/16 2344 (!) 161/92 mmHg 101 149/86 mmHg 103 120/66 mmHg 106 (!) 81/53 mmHg 102   Patient complained of feeling dizzy and nauseated while standing for orthostatic vital signs, but tolerated and did not appear pre-syncopal or experience any vision changes. After returning to a recumbent position, she stated she began feeling better relatively quickly. Otherwise, vital signs have been stable.  Chest pain pathway education initiated (see education record for detail).  Multiple psychosocial stressors also affecting patient at this time. She is currently residing at [facility] and is "running out of money." She is unemployed, has been denied Medicaid and disability services on application. She is not able to afford medications or medical bills. She has seen social work at the [facility] outpatient clinic; social work consult initiated per  protocol.  Continuing to monitor closely.

## 2016-03-31 ENCOUNTER — Encounter (HOSPITAL_COMMUNITY): Payer: Self-pay | Admitting: Cardiology

## 2016-03-31 ENCOUNTER — Encounter (HOSPITAL_COMMUNITY)
Admission: EM | Disposition: A | Discharge status: Home / Self Care | Payer: Self-pay | Source: Home / Self Care | Attending: Internal Medicine

## 2016-03-31 DIAGNOSIS — I25110 Atherosclerotic heart disease of native coronary artery with unstable angina pectoris: Principal | ICD-10-CM

## 2016-03-31 DIAGNOSIS — R011 Cardiac murmur, unspecified: Secondary | ICD-10-CM

## 2016-03-31 DIAGNOSIS — R931 Abnormal findings on diagnostic imaging of heart and coronary circulation: Secondary | ICD-10-CM

## 2016-03-31 DIAGNOSIS — Z9861 Coronary angioplasty status: Secondary | ICD-10-CM

## 2016-03-31 DIAGNOSIS — I2510 Atherosclerotic heart disease of native coronary artery without angina pectoris: Secondary | ICD-10-CM | POA: Insufficient documentation

## 2016-03-31 DIAGNOSIS — D649 Anemia, unspecified: Secondary | ICD-10-CM

## 2016-03-31 DIAGNOSIS — Z955 Presence of coronary angioplasty implant and graft: Secondary | ICD-10-CM

## 2016-03-31 DIAGNOSIS — N179 Acute kidney failure, unspecified: Secondary | ICD-10-CM

## 2016-03-31 DIAGNOSIS — R9439 Abnormal result of other cardiovascular function study: Secondary | ICD-10-CM | POA: Diagnosis not present

## 2016-03-31 DIAGNOSIS — I951 Orthostatic hypotension: Secondary | ICD-10-CM | POA: Diagnosis present

## 2016-03-31 DIAGNOSIS — R110 Nausea: Secondary | ICD-10-CM

## 2016-03-31 HISTORY — PX: CARDIAC CATHETERIZATION: SHX172

## 2016-03-31 HISTORY — DX: Atherosclerotic heart disease of native coronary artery without angina pectoris: I25.10

## 2016-03-31 HISTORY — DX: Presence of coronary angioplasty implant and graft: Z95.5

## 2016-03-31 LAB — PROTIME-INR
INR: 1.07 (ref 0.00–1.49)
PROTHROMBIN TIME: 14.1 s (ref 11.6–15.2)

## 2016-03-31 LAB — BASIC METABOLIC PANEL
ANION GAP: 9 (ref 5–15)
BUN: 17 mg/dL (ref 6–20)
CO2: 23 mmol/L (ref 22–32)
Calcium: 8.6 mg/dL — ABNORMAL LOW (ref 8.9–10.3)
Chloride: 108 mmol/L (ref 101–111)
Creatinine, Ser: 1.16 mg/dL — ABNORMAL HIGH (ref 0.44–1.00)
GFR calc Af Amer: >60 mL/min (ref >60)
GFR, EST NON AFRICAN AMERICAN: 58 mL/min — AB (ref >60)
GLUCOSE: 153 mg/dL — AB (ref 65–99)
POTASSIUM: 4.2 mmol/L (ref 3.5–5.1)
Sodium: 140 mmol/L (ref 135–145)

## 2016-03-31 LAB — GLUCOSE, CAPILLARY
Glucose-Capillary: 140 mg/dL — ABNORMAL HIGH (ref 65–99)
Glucose-Capillary: 128 mg/dL — ABNORMAL HIGH (ref 65–99)
Glucose-Capillary: 111 mg/dL — ABNORMAL HIGH (ref 65–99)
Glucose-Capillary: 112 mg/dL — ABNORMAL HIGH (ref 65–99)

## 2016-03-31 LAB — CBC
HCT: 26.1 % — ABNORMAL LOW (ref 36.0–46.0)
Hemoglobin: 8.6 g/dL — ABNORMAL LOW (ref 12.0–15.0)
MCH: 25.7 pg — ABNORMAL LOW (ref 26.0–34.0)
MCHC: 33 g/dL (ref 30.0–36.0)
MCV: 78.1 fL (ref 78.0–100.0)
PLATELETS: 266 10*3/uL (ref 150–400)
RBC: 3.34 MIL/uL — AB (ref 3.87–5.11)
RDW: 17.3 % — ABNORMAL HIGH (ref 11.5–15.5)
WBC: 4 10*3/uL (ref 4.0–10.5)

## 2016-03-31 LAB — POCT ACTIVATED CLOTTING TIME: Activated Clotting Time: 394 seconds

## 2016-03-31 SURGERY — LEFT HEART CATH AND CORONARY ANGIOGRAPHY
Anesthesia: LOCAL

## 2016-03-31 MED ORDER — ONDANSETRON HCL 4 MG/2ML IJ SOLN: 4.0000 mg | Freq: Four times a day (QID) | INTRAMUSCULAR | Status: DC | PRN

## 2016-03-31 MED ORDER — SODIUM CHLORIDE 0.9 % WEIGHT BASED INFUSION
1.0000 mL/kg/h | INTRAVENOUS | Status: DC
  Administered 2016-03-31: 1 mL/kg/h via INTRAVENOUS

## 2016-03-31 MED ORDER — FENTANYL CITRATE (PF) 100 MCG/2ML IJ SOLN
INTRAMUSCULAR | Status: AC
  Filled 2016-03-31: qty 2

## 2016-03-31 MED ORDER — HEPARIN SODIUM (PORCINE) 1000 UNIT/ML IJ SOLN
INTRAMUSCULAR | Status: AC
  Filled 2016-03-31: qty 1

## 2016-03-31 MED ORDER — BIVALIRUDIN BOLUS VIA INFUSION - CUPID
INTRAVENOUS | Status: DC | PRN
  Administered 2016-03-31: 66.675 mg via INTRAVENOUS

## 2016-03-31 MED ORDER — IOPAMIDOL (ISOVUE-370) INJECTION 76%
INTRAVENOUS | Status: DC | PRN
  Administered 2016-03-31: 195 mL via INTRA_ARTERIAL

## 2016-03-31 MED ORDER — TICAGRELOR 90 MG PO TABS
ORAL_TABLET | ORAL | Status: AC
  Filled 2016-03-31: qty 1

## 2016-03-31 MED ORDER — IOPAMIDOL (ISOVUE-370) INJECTION 76%
INTRAVENOUS | Status: AC
  Filled 2016-03-31: qty 100

## 2016-03-31 MED ORDER — MIDAZOLAM HCL 2 MG/2ML IJ SOLN
INTRAMUSCULAR | Status: AC
  Filled 2016-03-31: qty 2

## 2016-03-31 MED ORDER — MIDAZOLAM HCL 2 MG/2ML IJ SOLN
INTRAMUSCULAR | Status: DC | PRN
Start: 2016-03-31 — End: 2016-03-31
  Administered 2016-03-31: 1 mg via INTRAVENOUS

## 2016-03-31 MED ORDER — ATORVASTATIN CALCIUM 40 MG PO TABS
40.0000 mg | ORAL_TABLET | Freq: Every day | ORAL | Status: DC
  Administered 2016-04-01 – 2016-04-02 (×3): 40 mg via ORAL
  Filled 2016-03-31 (×3): qty 1

## 2016-03-31 MED ORDER — HYDRALAZINE HCL 20 MG/ML IJ SOLN
10.0000 mg | INTRAMUSCULAR | Status: DC | PRN
  Administered 2016-04-02 (×2): 10 mg via INTRAVENOUS
  Filled 2016-03-31 (×3): qty 1

## 2016-03-31 MED ORDER — BIVALIRUDIN 250 MG IV SOLR
INTRAVENOUS | Status: AC
  Filled 2016-03-31: qty 250

## 2016-03-31 MED ORDER — ACETAMINOPHEN 325 MG PO TABS
650.0000 mg | ORAL_TABLET | ORAL | Status: DC | PRN
  Administered 2016-04-02: 650 mg via ORAL
  Filled 2016-03-31: qty 2

## 2016-03-31 MED ORDER — TICAGRELOR 90 MG PO TABS: 90.0000 mg | ORAL_TABLET | Freq: Two times a day (BID) | ORAL | Status: DC

## 2016-03-31 MED ORDER — TICAGRELOR 90 MG PO TABS
ORAL_TABLET | ORAL | Status: DC | PRN
  Administered 2016-03-31: 180 mg via ORAL

## 2016-03-31 MED ORDER — HYDRALAZINE HCL 20 MG/ML IJ SOLN
INTRAMUSCULAR | Status: DC | PRN
  Administered 2016-03-31: 10 mg via INTRAVENOUS

## 2016-03-31 MED ORDER — SODIUM CHLORIDE 0.9 % IV BOLUS (SEPSIS): 1000.0000 mL | Freq: Once | INTRAVENOUS | Status: DC

## 2016-03-31 MED ORDER — SODIUM CHLORIDE 0.9 % WEIGHT BASED INFUSION
3.0000 mL/kg/h | INTRAVENOUS | Status: DC
  Administered 2016-03-31: 3 mL/kg/h via INTRAVENOUS

## 2016-03-31 MED ORDER — NITROGLYCERIN IN D5W 200-5 MCG/ML-% IV SOLN
INTRAVENOUS | Status: DC | PRN
  Administered 2016-03-31: 10 ug/min via INTRAVENOUS

## 2016-03-31 MED ORDER — LIDOCAINE HCL (PF) 1 % IJ SOLN
INTRAMUSCULAR | Status: DC | PRN
  Administered 2016-03-31: 28 mL

## 2016-03-31 MED ORDER — FENTANYL CITRATE (PF) 100 MCG/2ML IJ SOLN
INTRAMUSCULAR | Status: DC | PRN
  Administered 2016-03-31: 25 ug via INTRAVENOUS

## 2016-03-31 MED ORDER — HEPARIN (PORCINE) IN NACL 2-0.9 UNIT/ML-% IJ SOLN
INTRAMUSCULAR | Status: AC
  Filled 2016-03-31: qty 1000

## 2016-03-31 MED ORDER — SODIUM CHLORIDE 0.9 % IV SOLN: 250.0000 mL | INTRAVENOUS | Status: DC | PRN

## 2016-03-31 MED ORDER — ANGIOPLASTY BOOK
Freq: Once | Status: AC
  Administered 2016-03-31: 23:00:00
  Filled 2016-03-31: qty 1

## 2016-03-31 MED ORDER — SODIUM CHLORIDE 0.9 % IV SOLN
1.7500 mg/kg/h | INTRAVENOUS | Status: AC
  Administered 2016-03-31: 1.75 mg/kg via INTRAVENOUS
  Filled 2016-03-31 (×2): qty 250

## 2016-03-31 MED ORDER — NITROGLYCERIN IN D5W 200-5 MCG/ML-% IV SOLN
INTRAVENOUS | Status: AC
  Filled 2016-03-31: qty 250

## 2016-03-31 MED ORDER — HEPARIN (PORCINE) IN NACL 2-0.9 UNIT/ML-% IJ SOLN
INTRAMUSCULAR | Status: DC | PRN
  Administered 2016-03-31: 1000 mL via INTRA_ARTERIAL

## 2016-03-31 MED ORDER — SODIUM CHLORIDE 0.9% FLUSH: 3.0000 mL | Freq: Two times a day (BID) | INTRAVENOUS | Status: DC

## 2016-03-31 MED ORDER — VERAPAMIL HCL 2.5 MG/ML IV SOLN
INTRAVENOUS | Status: AC
  Filled 2016-03-31: qty 2

## 2016-03-31 MED ORDER — SODIUM CHLORIDE 0.9% FLUSH: 3.0000 mL | INTRAVENOUS | Status: DC | PRN

## 2016-03-31 MED ORDER — ASPIRIN 81 MG PO CHEW
81.0000 mg | CHEWABLE_TABLET | Freq: Every day | ORAL | Status: DC
  Administered 2016-04-01 – 2016-04-02 (×2): 81 mg via ORAL
  Filled 2016-03-31 (×2): qty 1

## 2016-03-31 MED ORDER — ASPIRIN 81 MG PO CHEW
81.0000 mg | CHEWABLE_TABLET | ORAL | Status: DC
Start: 2016-04-01 — End: 2016-03-31

## 2016-03-31 MED ORDER — HEPARIN SODIUM (PORCINE) 1000 UNIT/ML IJ SOLN
INTRAMUSCULAR | Status: DC | PRN
  Administered 2016-03-31: 4500 [IU] via INTRAVENOUS

## 2016-03-31 MED ORDER — MORPHINE SULFATE (PF) 2 MG/ML IV SOLN
2.0000 mg | INTRAVENOUS | Status: DC | PRN
  Administered 2016-03-31 (×2): 2 mg via INTRAVENOUS
  Filled 2016-03-31 (×2): qty 1

## 2016-03-31 MED ORDER — SODIUM CHLORIDE 0.9 % WEIGHT BASED INFUSION: 3.0000 mL/kg/h | INTRAVENOUS | Status: DC

## 2016-03-31 MED ORDER — ATROPINE SULFATE 1 MG/10ML IJ SOSY
PREFILLED_SYRINGE | INTRAMUSCULAR | Status: AC
  Filled 2016-03-31: qty 10

## 2016-03-31 MED ORDER — LIDOCAINE HCL (PF) 1 % IJ SOLN
INTRAMUSCULAR | Status: AC
  Filled 2016-03-31: qty 30

## 2016-03-31 MED ORDER — NITROGLYCERIN 0.2 MG/ML ON CALL CATH LAB
INTRAVENOUS | Status: AC
  Filled 2016-03-31: qty 1

## 2016-03-31 MED ORDER — SODIUM CHLORIDE 0.9% FLUSH
3.0000 mL | Freq: Two times a day (BID) | INTRAVENOUS | Status: DC
  Administered 2016-04-01 (×2): 3 mL via INTRAVENOUS

## 2016-03-31 MED ORDER — HYDRALAZINE HCL 20 MG/ML IJ SOLN
INTRAMUSCULAR | Status: AC
  Filled 2016-03-31: qty 1

## 2016-03-31 MED ORDER — SODIUM CHLORIDE 0.9 % WEIGHT BASED INFUSION: 1.0000 mL/kg/h | INTRAVENOUS | Status: DC

## 2016-03-31 SURGICAL SUPPLY — 24 items
BALLN EUPHORA RX 2.0X12 (BALLOONS) ×2
BALLN ~~LOC~~ EMERGE MR 3.5X15 (BALLOONS) ×2
BALLOON EUPHORA RX 2.0X12 (BALLOONS) ×1 IMPLANT
BALLOON ~~LOC~~ EMERGE MR 3.5X15 (BALLOONS) ×1 IMPLANT
CATH INFINITI 5 FR IM (CATHETERS) ×2 IMPLANT
CATH INFINITI 5 FR JL3.5 (CATHETERS) ×2 IMPLANT
CATH INFINITI 5FR ANG PIGTAIL (CATHETERS) ×2 IMPLANT
CATH OPTITORQUE TIG 4.0 5F (CATHETERS) ×2 IMPLANT
CATH VISTA GUIDE 6FR XBLAD3.5 (CATHETERS) ×2 IMPLANT
DEVICE CONTINUOUS FLUSH (MISCELLANEOUS) ×2 IMPLANT
DEVICE RAD COMP TR BAND LRG (VASCULAR PRODUCTS) ×2 IMPLANT
GLIDESHEATH SLEND A-KIT 6F 22G (SHEATH) ×2 IMPLANT
KIT HEART LEFT (KITS) ×2 IMPLANT
PACK CARDIAC CATHETERIZATION (CUSTOM PROCEDURE TRAY) ×2 IMPLANT
SHEATH PINNACLE 6F 10CM (SHEATH) ×2 IMPLANT
STENT VISION RX 3.0X18 (Permanent Stent) ×2 IMPLANT
SYR MEDRAD MARK V 150ML (SYRINGE) ×2 IMPLANT
TRANSDUCER W/STOPCOCK (MISCELLANEOUS) ×2 IMPLANT
TUBING CIL FLEX 10 FLL-RA (TUBING) ×4 IMPLANT
WIRE ASAHI PROWATER 180CM (WIRE) ×2 IMPLANT
WIRE EMERALD 3MM-J .035X150CM (WIRE) ×2 IMPLANT
WIRE HI TORQ VERSACORE-J 145CM (WIRE) ×2 IMPLANT
WIRE J 3MM .035X260CM (WIRE) ×2 IMPLANT
WIRE SAFE-T 1.5MM-J .035X260CM (WIRE) ×2 IMPLANT

## 2016-03-31 NOTE — Progress Notes (Signed)
    Cardiac Cath Consent  Performing MD:  Lendell Caprice, M.D  Procedure:  LEFT HEART CATHETERIZATION WITH CORONARY ANGIOGRAPHY AND POSSIBLE PERCUTANEOUS CORONARY INTERVENTION  The procedure with Risks/Benefits/Alternatives and Indications was reviewed with the patient.  All questions were answered.    The patient understands that risks included but are not limited to stroke (1 in 1000), death (1 in 29), kidney failure [usually temporary] (1 in 500), bleeding (1 in 200), allergic reaction [possibly serious] (1 in 200).  She agreed to proceed with cath.    She denies pregnancy has had BTL.  The patient voice understanding and agree to proceed.      Glenetta Hew, M.D., M.S. Interventional Cardiologist   Pager # [phone] Phone # [phone] [address] [place], [place] [address]

## 2016-03-31 NOTE — Plan of Care (Signed)
Problem: Pain Managment: Goal: General experience of comfort will improve Outcome: Progressing Patient able to relay pain,quality,radiation, duration and scale (0-10) and is okay for now, instructed to call if she has pain and would need anything and she verbalized understanding. Patient having some anxiety and instructed her on some relaxation techniques to help her, will continue to monitor.

## 2016-03-31 NOTE — Plan of Care (Signed)
Problem: Safety: Goal: Ability to remain free from injury will improve Outcome: Progressing Reviewed safety rules on unit and shown where numbers were for her RN/NT and how to call on the phone and patient verbalized understanding, all belongings along with [facility] are within reach and her call light and phone are in her bed where she can reach them, will continue to monitor.

## 2016-03-31 NOTE — H&P (View-Only) (Signed)
    Cardiac Cath Consent  Performing MD:  Lendell Caprice, M.D  Procedure:  LEFT HEART CATHETERIZATION WITH CORONARY ANGIOGRAPHY AND POSSIBLE PERCUTANEOUS CORONARY INTERVENTION  The procedure with Risks/Benefits/Alternatives and Indications was reviewed with the patient.  All questions were answered.    The patient understands that risks included but are not limited to stroke (1 in 1000), death (1 in 5), kidney failure [usually temporary] (1 in 500), bleeding (1 in 200), allergic reaction [possibly serious] (1 in 200).  She agreed to proceed with cath.    She denies pregnancy has had BTL.  The patient voice understanding and agree to proceed.      Glenetta Hew, M.D., M.S. Interventional Cardiologist   Pager # [phone] Phone # [phone] [address] [place], [place] [address]

## 2016-03-31 NOTE — Progress Notes (Signed)
Subjective: Jane [name] had no acute events overnight. She does feel a bit nauseous this morning without abdominal pain or vomiting. She denies CP, palpitations, or SOB, or other symptoms at this time.   Objective: Vital signs in last 24 hours: Filed Vitals:   03/30/16 2225 03/31/16 0449 03/31/16 0700 03/31/16 0827  BP: 141/75 173/89  155/97  Pulse: 83 90  86  Temp: 98.7 F (37.1 C) 98.8 F (37.1 C)  98.7 F (37.1 C)  TempSrc: Oral Oral  Oral  Resp: 20   17  Height:      Weight:   196 lb (88.905 kg)   SpO2: 100% 100%  100%     Gen: Well-appearing, alert and oriented to person, place, and time HEENT: Oropharynx clear without erythema or exudate.  Neck: No cervical LAD, no thyromegaly or nodules, no JVD noted. CV: Normal rate, regular rhythm, soft 1/6 blowing systolic murmur at LSB that is non-radiating, rubs, or gallops. Pain reproducible on palpation on L sternum. Pulmonary: Normal effort, CTA bilaterally, no crackles or wheezes Abdominal: Soft, non-tender, non-distended, without rebound, guarding, or masses Extremities: Distal pulses 2+ in upper and lower extremities bilaterally, no tenderness, erythema or edema. L foot in cast. Skin: No atypical appearing moles. No rashes  Lab Results: Basic Metabolic Panel:  Recent Labs Lab 03/30/16 0324 03/31/16 0818  NA 140 140  K 4.3 4.2  CL 108 108  CO2 24 23  GLUCOSE 257* 153*  BUN 34* 17  CREATININE 1.57* 1.16*  CALCIUM 8.5* 8.6*   CBC:  Recent Labs Lab 03/29/16 1541 03/31/16 0818  WBC 6.3 4.0  HGB 9.3* 8.6*  HCT 29.1* 26.1*  MCV 79.3 78.1  PLT 300 266   Cardiac Enzymes:  Recent Labs Lab 03/29/16 2127 03/30/16 0324 03/30/16 0851  TROPONINI <0.03 <0.03 <0.03   Assessment/Plan: 1. Atypical chest pain in a moderate CV risk patient - EKG with borderline Q waves vs normal EKG, troponins negative x 3, CXR clear but in patient with extensive RFs. Her current presentation does seem more consistent with  musculoligamentous strain than cardiac pathology, although she is at high risk for underlying CV disease. Myoview intermediate risk. EF was reduced on Myoview but normal on TTE, which also showed grade 1 diastolic dysfunction and mild TR. -Cardiology following; appreciate recs. Scheduled for cath today -Resume atorvastatin on d/c (was previously on but not recently) -Will change HTN meds based on CAD status or not  2. Orthostatic hypotension - symptoms more c/w orthostatic hypotension from dehydration + her medications than a cardiac etiology. Her systolic murmur is almost certainly a benign flow murmur due to her anemia so I do not think these two are related. Positive orthostatics here and AKI as well to further support the diagnosis. Anemia and poor PO intake could make these symptoms worse as well. -Continue IVF -Ondansetron PRN for nausea -F/u BMPs. AKI improving with IVF here -Will continue lisinopril on discharge. Will discontinue HCTZ. If patient does not have CAD on LHC, will likely discontinue BB as well and switch to diltiazem or verapamil (as amlodipine would possibly worsen her nephropathy).  Dispo: Disposition is deferred at this time, awaiting improvement of current medical problems.  Anticipated discharge in approximately 1 day(s).   The patient does have a current PCP (Juluis Mire, MD) and does need an [facility] hospital follow-up appointment after discharge.  The patient does not have transportation limitations that hinder transportation to clinic appointments.  LOS: 1 day   Talmage Coin  Merrilyn Puma, MD 03/31/2016, 9:52 AM

## 2016-03-31 NOTE — Interval H&P Note (Signed)
Cath Lab Visit (complete for each Cath Lab visit)  Clinical Evaluation Leading to the Procedure:   ACS: Yes.    Non-ACS:    Anginal Classification: CCS III  Anti-ischemic medical therapy: No Therapy  Non-Invasive Test Results: Intermediate-risk stress test findings: cardiac mortality 1-3%/year  Prior CABG: No previous CABG      History and Physical Interval Note:  03/31/2016 1:14 PM  Jane [name]  has presented today for surgery, with the diagnosis of unstable angina  The various methods of treatment have been discussed with the patient and family. After consideration of risks, benefits and other options for treatment, the patient has consented to  Procedure(s): Left Heart Cath and Coronary Angiography (N/A) as a surgical intervention .  The patient's history has been reviewed, patient examined, no change in status, stable for surgery.  I have reviewed the patient's chart and labs.  Questions were answered to the patient's satisfaction.     Quay Burow

## 2016-03-31 NOTE — Progress Notes (Signed)
CSW consulted for inpatient follow up regarding pt multiple social issues.  CSW spoke with outpatient CSW who had worked with pt 5/16.  Otpt CSW gave pt multiple referrals and information to assist with her situation but did suggest CSW follow up with inpatient financial counseling regarding pt disability/Medicaid  CSW contacted financial counseling who has seen pt this admission regarding financial application for hospital bills and is following pt to see if she qualifies for medicaid/disability assistance  No further needs at this time- please reconsult if pt needing further assistance  Domenica Reamer, [place] Worker [phone]

## 2016-03-31 NOTE — Progress Notes (Signed)
Report received in patient's room using SBAR format, reviewed orders,labs, VS, meds and patient's general condition, assumed care of patient.

## 2016-03-31 NOTE — Progress Notes (Signed)
Subjective: No complaints  Objective: Vital signs in last 24 hours: Temp:  [98.1 F (36.7 C)-98.8 F (37.1 C)] 98.7 F (37.1 C) (05/18 0827) Pulse Rate:  [83-100] 86 (05/18 0827) Resp:  [17-20] 17 (05/18 0827) BP: (123-197)/(60-104) 155/97 mmHg (05/18 0827) SpO2:  [99 %-100 %] 100 % (05/18 0827) Weight:  [196 lb (88.905 kg)] 196 lb (88.905 kg) (05/18 0700) Weight change: 5 lb (2.268 kg) Last BM Date: 03/30/16 Intake/Output from previous day: -880 05/17 0701 - 05/18 0700 In: 2220 [P.O.:720; I.V.:1500] Out: 3100 [Urine:3100] Intake/Output this shift: Total I/O In: 3 [I.V.:3] Out: -   PE: General:Pleasant affect, NAD Skin:Warm and dry, brisk capillary refill HEENT:normocephalic, sclera clear, mucus membranes moist Neck:supple, no JVD, no bruits  Heart:S1S2 RRR with soft systolic murmur, no gallup, rub or click Lungs:clear without rales, rhonchi, or wheezes [id number], non tender, + BS, do not palpate liver spleen or masses Ext:no lower ext edema,  2+ radial pulses, lt lower ext with cast. Neuro:alert and oriented X 3, MAE, follows commands, + facial symmetry Tele: SR   Lab Results:  Recent Labs  03/29/16 1541 03/31/16 0818  WBC 6.3 4.0  HGB 9.3* 8.6*  HCT 29.1* 26.1*  PLT 300 266   BMET  Recent Labs  03/29/16 1541 03/30/16 0324  NA 137 140  K 4.3 4.3  CL 107 108  CO2 24 24  GLUCOSE 144* 257*  BUN 35* 34*  CREATININE 2.00* 1.57*  CALCIUM 9.1 8.5*    Recent Labs  03/30/16 0324 03/30/16 0851  TROPONINI <0.03 <0.03    Lab Results  Component Value Date   CHOL 243* 06/09/2015   HDL 65 06/09/2015   LDLCALC 146* 06/09/2015   TRIG 158* 06/09/2015   CHOLHDL 3.7 06/09/2015   Lab Results  Component Value Date   HGBA1C 9.2 03/17/2016     Lab Results  Component Value Date   TSH 0.788 12/08/2015     Studies/Results: Dg Chest 2 View  03/29/2016  CLINICAL DATA:  Chest pain for 1 day EXAM: CHEST  2 VIEW COMPARISON:  01/03/2016  FINDINGS: Cardiac shadow is within normal limits. The lungs are clear bilaterally. No acute bony abnormality is noted. IMPRESSION: No active cardiopulmonary disease. Electronically Signed   By: Inez Catalina M.D.   On: 03/29/2016 16:10   Nm Myocar Multi W/spect W/wall Motion / Ef  03/30/2016   There was no ST segment deviation noted during stress.  No T wave inversion was noted during stress.  This is an intermediate risk study.  The left ventricular ejection fraction is moderately decreased (30-44%).  1. Reversible, medium-sized, mild basal to mid inferior perfusion defect.  This is concerning for ischemia.  However, there was significant gut uptake on this study so cannot rule out artifact. 2. EF 38% with diffuse hypokinesis, appears worse inferiorly. 3. Intermediate risk study.   ECHO: Study Conclusions  - Left ventricle: The cavity size was normal. Wall thickness was  normal. Systolic function was normal. The estimated ejection  fraction was in the range of 60% to 65%. Wall motion was normal;  there were no regional wall motion abnormalities. Doppler  parameters are consistent with abnormal left ventricular  relaxation (grade 1 diastolic dysfunction). - Tricuspid valve: There was mild regurgitation.  Medications: I have reviewed the patient's current medications. Scheduled Meds: . aspirin EC  81 mg Oral Daily  . capsaicin   Topical TID WC & HS  . ferrous sulfate  325 mg Oral Q breakfast  . heparin  5,000 Units Subcutaneous Q8H  . insulin aspart  0-5 Units Subcutaneous QHS  . insulin aspart  0-9 Units Subcutaneous TID WC  . insulin glargine  7 Units Subcutaneous QHS  . metoprolol succinate  50 mg Oral Daily  . sodium chloride flush  3 mL Intravenous Q12H   Continuous Infusions: . 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  Chest pain/ angina with neg troponin but + nuc study with several risk factors for CAD - discussed cardiac cath and she has agreed to proceed.  Have discussed at length see previous note.  CKD 3- rec'd fluids overnight labs pending.  Anemia with hx of uterine bleeding now with fluids decrease in H/H.  DM-2 poorly controlled with hgbA1c 9.2 per IM  HTN - elevated at times. On ASA, toprol 50  Hyperlipidemia  With LDL in 05/2015 of 146 Tchol 243, TG 158  HDL 65.  Will add statin     LOS: 1 day   Time spent with pt. : 15 minutes. Cecilie Kicks  Nurse Practitioner Certified Pager [phone] or after 5pm and on weekends call [phone] 03/31/2016, 9:06 AM

## 2016-03-31 NOTE — Research (Signed)
[place] Study Informed Consent   Subject Name: Jane [name]  Subject met inclusion and exclusion criteria.  The informed consent form, study requirements and expectations were reviewed with the subject and questions and concerns were addressed prior to the signing of the consent form.  The subject verbalized understanding of the trial requirements.  The subject agreed to participate in the trial and signed the informed consent.  The informed consent was obtained prior to performance of any protocol-specific procedures for the subject.  A copy of the signed informed consent was given to the subject and a copy was placed in the subject's medical record.  Blossom Hoops 03/31/2016, 5:59 PM

## 2016-04-01 ENCOUNTER — Encounter (HOSPITAL_COMMUNITY): Payer: Self-pay | Admitting: Cardiovascular Disease

## 2016-04-01 DIAGNOSIS — D509 Iron deficiency anemia, unspecified: Secondary | ICD-10-CM

## 2016-04-01 DIAGNOSIS — I200 Unstable angina: Secondary | ICD-10-CM

## 2016-04-01 DIAGNOSIS — Z955 Presence of coronary angioplasty implant and graft: Secondary | ICD-10-CM

## 2016-04-01 DIAGNOSIS — E1121 Type 2 diabetes mellitus with diabetic nephropathy: Secondary | ICD-10-CM

## 2016-04-01 DIAGNOSIS — I2510 Atherosclerotic heart disease of native coronary artery without angina pectoris: Secondary | ICD-10-CM

## 2016-04-01 DIAGNOSIS — Z598 Other problems related to housing and economic circumstances: Secondary | ICD-10-CM

## 2016-04-01 DIAGNOSIS — D500 Iron deficiency anemia secondary to blood loss (chronic): Secondary | ICD-10-CM

## 2016-04-01 DIAGNOSIS — N939 Abnormal uterine and vaginal bleeding, unspecified: Secondary | ICD-10-CM

## 2016-04-01 LAB — CBC
HCT: 21.9 % — ABNORMAL LOW (ref 36.0–46.0)
HEMOGLOBIN: 7.1 g/dL — AB (ref 12.0–15.0)
MCH: 26.1 pg (ref 26.0–34.0)
MCHC: 32.4 g/dL (ref 30.0–36.0)
MCV: 80.5 fL (ref 78.0–100.0)
Platelets: 220 10*3/uL (ref 150–400)
RBC: 2.72 MIL/uL — ABNORMAL LOW (ref 3.87–5.11)
RDW: 17.7 % — ABNORMAL HIGH (ref 11.5–15.5)
WBC: 5.6 10*3/uL (ref 4.0–10.5)

## 2016-04-01 LAB — GLUCOSE, CAPILLARY
Glucose-Capillary: 118 mg/dL — ABNORMAL HIGH (ref 65–99)
Glucose-Capillary: 188 mg/dL — ABNORMAL HIGH (ref 65–99)
Glucose-Capillary: 153 mg/dL — ABNORMAL HIGH (ref 65–99)

## 2016-04-01 LAB — BASIC METABOLIC PANEL
ANION GAP: 7 (ref 5–15)
BUN: 19 mg/dL (ref 6–20)
CALCIUM: 8.3 mg/dL — AB (ref 8.9–10.3)
CO2: 22 mmol/L (ref 22–32)
Chloride: 112 mmol/L — ABNORMAL HIGH (ref 101–111)
Creatinine, Ser: 1.38 mg/dL — ABNORMAL HIGH (ref 0.44–1.00)
GFR calc non Af Amer: 47 mL/min — ABNORMAL LOW (ref >60)
GFR, EST AFRICAN AMERICAN: 54 mL/min — AB (ref >60)
GLUCOSE: 133 mg/dL — AB (ref 65–99)
Potassium: 4.3 mmol/L (ref 3.5–5.1)
SODIUM: 141 mmol/L (ref 135–145)

## 2016-04-01 LAB — PREPARE RBC (CROSSMATCH)

## 2016-04-01 LAB — ABO/RH: ABO/RH(D): O POS

## 2016-04-01 MED ORDER — ACETAMINOPHEN 325 MG PO TABS
650.0000 mg | ORAL_TABLET | Freq: Once | ORAL | Status: AC
  Administered 2016-04-01: 650 mg via ORAL
  Filled 2016-04-01: qty 2

## 2016-04-01 MED ORDER — NITROGLYCERIN 0.4 MG SL SUBL
SUBLINGUAL_TABLET | SUBLINGUAL | Status: AC
  Administered 2016-04-01: 0.4 mg
  Filled 2016-04-01: qty 1

## 2016-04-01 MED ORDER — SODIUM CHLORIDE 0.9 % IV SOLN
Freq: Once | INTRAVENOUS | Status: AC
  Administered 2016-04-01: 14:00:00 via INTRAVENOUS

## 2016-04-01 MED ORDER — CLOPIDOGREL BISULFATE 75 MG PO TABS
75.0000 mg | ORAL_TABLET | Freq: Every day | ORAL | Status: DC
Start: 2016-04-01 — End: 2016-05-23

## 2016-04-01 MED ORDER — CLOPIDOGREL BISULFATE 75 MG PO TABS
75.0000 mg | ORAL_TABLET | Freq: Every day | ORAL | Status: DC
  Administered 2016-04-01 – 2016-04-02 (×2): 75 mg via ORAL
  Filled 2016-04-01 (×2): qty 1

## 2016-04-01 MED ORDER — DIPHENHYDRAMINE HCL 25 MG PO CAPS
25.0000 mg | ORAL_CAPSULE | Freq: Once | ORAL | Status: AC
  Administered 2016-04-01: 25 mg via ORAL
  Filled 2016-04-01: qty 1

## 2016-04-01 MED FILL — Verapamil HCl IV Soln 2.5 MG/ML: INTRAVENOUS | Qty: 2 | Status: AC

## 2016-04-01 MED FILL — CLOPIDOGREL 75 MG TABLET: 75 | 30 days supply | Qty: 30 | Fill #0

## 2016-04-01 NOTE — Clinical Social Work Note (Signed)
CSW acknowledging consult for medication assistance. Made RNCM aware.  Jane [name], Jane [name]

## 2016-04-01 NOTE — Progress Notes (Signed)
CARDIAC REHAB PHASE I   Pt in bed, has radial/femoral cath sites, has cast to LLE and requires crutches for ambulation. Discussed with attending team, pt would benefit from PT consult for mobility. Pt is NWB to LLE and has RUE restrictions post cath. Pt c/o fatigue, dizziness this morning. Pt states she has been under significant stress recently. She has been living in an extended stay motel, but has run out of money and is not sure where she will be staying upon discharge from hospital, SW following. Case management consulted for medication needs as well, pt is on brilinta. Completed PCI/stent education. Reviewed risk factors, anti-platelet therapy, stent card, activity restrictions, ntg, exercise, heart healthy diet, carb counting, portion control, and phase 2 cardiac rehab. Pt verbalized understanding, receptive to education. Pt agrees to phase 2 cardiac rehab referral, will send to [facility] per pt request (pt will be able to participate pending financial assistance). Will follow if pt does not discharge today. Pt in bed, call bell within reach.  [id number]  [name], RN, BSN 04/01/2016 9:12 AM

## 2016-04-01 NOTE — Care Management Note (Addendum)
Case Management Note  Patient Details  Name: Jane [name] MRN: [id number] Date of Birth: 1976/03/30  Subjective/Objective:  Patient lives in extended stay hotel, she does not have transportation, CSW aware and will arrange for cab for patient,  NCM assisted patient with Match for plavix.  NCM faxed script to [facility] outpatient pharmacy to be filled, and gave the medication to [facility].  Informed patient that her RN has her plavix for her when she is ready to be discharged.   Patient is for dc tomorrow, she states she will be using the crutches for right now that are already in her room a scooter will cost her $80 to rent, which she does not have. Patient will need to apply for orange card with Bonna Gains in internal med clinic. NCM gave patient Michele Mcalpine phone number and informed her to call her next week.                  Action/Plan:   Expected Discharge Date:                  Expected Discharge Plan:  Home/Self Care  In-House Referral:     Discharge planning Services  CM Consult, Medication Assistance, MATCH Program  Post Acute Care Choice:    Choice offered to:     DME Arranged:    DME Agency:     HH Arranged:    HH Agency:     Status of Service:  Completed, signed off  Medicare Important Message Given:    Date Medicare IM Given:    Medicare IM give by:    Date Additional Medicare IM Given:    Additional Medicare Important Message give by:     If discussed at [place] of Stay Meetings, dates discussed:    Additional Comments:  Zenon Mayo, RN 04/01/2016, 4:07 PM

## 2016-04-01 NOTE — Progress Notes (Signed)
Site area: right groin  Site Prior to Removal:  Level 0  Pressure Applied For 50 MINUTES    Minutes Beginning at 2125   Manual:   Yes.    Patient Status During Pull:  Stable  Post Pull Groin Site:  Level 0  Post Pull Instructions Given:  Yes.    Post Pull Pulses Present:  Yes.    Dressing Applied:  Yes.    Comments:  Pt stable, drsg cdi

## 2016-04-01 NOTE — Evaluation (Signed)
Physical Therapy Evaluation Patient Details Name: Jane [name] MRN: [id number] DOB: 22-Jun-1976 Today's Date: 04/01/2016   History of Present Illness  Pt admit with angina with stent placed yesterday in cath.  Radial sitewith clot that MD concerned about if pt puts too much weight on right UE.  Pt was using crutches therefore asked by MD to try knee walker.  Clinical Impression  Pt admitted with above diagnosis. Pt currently with functional limitations due to the deficits listed below (see PT Problem List). Pt was able to use knee walker with instruction.  Wants to go to homeless shelter.  CM and SW working on d/c planning.  Do not feel that further PT out of hospital is warranted but will provide further practice with knee walker if pt is still in hospital tomorrow. Of note, pt BP was orthostatic but did not have symptoms of dizziness.  Nurse aware. Pt will benefit from skilled PT to increase their independence and safety with mobility to allow discharge to the venue listed below.      Follow Up Recommendations No PT follow up    Equipment Recommendations   (knee walker)    Recommendations for Other Services       Precautions / Restrictions Precautions Precautions: Fall Restrictions Weight Bearing Restrictions: Yes RUE Weight Bearing: Partial weight bearing LLE Weight Bearing: Non weight bearing      Mobility  Bed Mobility Overal bed mobility: Independent                Transfers Overall transfer level: Needs assistance Equipment used:  (knee walker) Transfers: Sit to/from Stand Sit to Stand: Min guard         General transfer comment: Instructed pt in how to perform sit to stand with knee walker by placing parallel to bed and locking the walker and then standing and turning to get LE onto platform.   Ambulation/Gait Ambulation/Gait assistance: Min guard;Supervision Ambulation Distance (Feet): 30 Feet Assistive device:  (knee walker) Gait  Pattern/deviations: Step-to pattern;Antalgic   Gait velocity interpretation: <1.8 ft/sec, indicative of risk for recurrent falls General Gait Details: Pt able to use knee walker appropriately after instructed on use.  Pt likes the knee walker and was shown how to lock and unlock brakes.   Stairs            Wheelchair Mobility    Modified Rankin (Stroke Patients Only)       Balance Overall balance assessment: Needs assistance Sitting-balance support: No upper extremity supported;Feet supported Sitting balance-Leahy Scale: Fair     Standing balance support: Bilateral upper extremity supported;During functional activity Standing balance-Leahy Scale: Fair Standing balance comment: can stand statically without device and maintain balance on one LE             High level balance activites: Direction changes;Turns;Sudden stops High Level Balance Comments: can perform all of above with knee walker with supervision             Pertinent Vitals/Pain Pain Assessment: No/denies pain  Orthostatic BPs  Supine 175/91  Sitting 141/78  Standing 109/55    BP after walking was 128/69.  Nurse aware.  Home Living Family/patient expects to be discharged to:: Shelter/Homeless                 Additional Comments: Pt was living in extended stay motel. Now states she will need to find  a homeless shelter.  Has crutches but can't use them due to new issues.  Prior Function Level of Independence: Independent with assistive device(s)               Hand Dominance        Extremity/Trunk Assessment   Upper Extremity Assessment: Defer to OT evaluation           Lower Extremity Assessment: Generalized weakness      Cervical / Trunk Assessment: Normal  Communication   Communication: No difficulties  Cognition Arousal/Alertness: Awake/alert Behavior During Therapy: WFL for tasks assessed/performed Overall Cognitive Status: Within Functional Limits for  tasks assessed                      General Comments General comments (skin integrity, edema, etc.): cast on left LE    Exercises        Assessment/Plan    PT Assessment Patient needs continued PT services  PT Diagnosis Generalized weakness   PT Problem List Decreased balance;Decreased mobility;Decreased activity tolerance;Decreased knowledge of use of DME;Decreased safety awareness;Decreased knowledge of precautions  PT Treatment Interventions DME instruction;Gait training;Functional mobility training;Therapeutic activities;Therapeutic exercise;Balance training;Patient/family education   PT Goals (Current goals can be found in the Care Plan section) Acute Rehab PT Goals Patient Stated Goal: to go to homeless shelter PT Goal Formulation: With patient Time For Goal Achievement: 04/08/16 Potential to Achieve Goals: Good    Frequency Min 3X/week   Barriers to discharge Decreased caregiver support      Co-evaluation               End of Session Equipment Utilized During Treatment: Gait belt Activity Tolerance: Patient limited by fatigue (limited by orthostasis) Patient left: in bed;with call bell/phone within reach;with bed alarm set;with nursing/sitter in room Nurse Communication: Mobility status         Time: [id number] PT Time Calculation (min) (ACUTE ONLY): 38 min   Charges:   PT Evaluation $PT Eval Moderate Complexity: 1 Procedure PT Treatments $Gait Training: 8-22 mins $Self Care/Home Management: 8-22   PT G CodesDenice Paradise Apr 16, 2016, 4:42 PM Suraiya Dickerson,PT Acute Rehabilitation [phone] (pager)

## 2016-04-01 NOTE — Clinical Social Work Note (Signed)
Clinical Social Work Assessment  Patient Details  Name: Jane [name] MRN: [id number] Date of Birth: Jul 24, 1976  Date of referral:  04/01/16               Reason for consult:  Housing Concerns/Homelessness                Permission sought to share information with:    Permission granted to share information::  No  Name::        Agency::     Relationship::     Contact Information:     Housing/Transportation Living arrangements for the past 2 months:  Hotel/Motel, Homeless Shelter Source of Information:  Patient, Medical Team Patient Interpreter Needed:  None Criminal Activity/Legal Involvement Pertinent to Current Situation/Hospitalization:  No - Comment as needed Significant Relationships:  Siblings, Parents Lives with:  Self Do you feel safe going back to the place where you live?  Yes Need for family participation in patient care:  No (Coment)  Care giving concerns:  Patient currently living in Extended Stay. Unable to work since injury/unable to pay bills. Needs transportation to car. Was taken by EMS from [place].   Social Worker assessment / plan:  CSW met with patient. Medical staff at bedside. CSW introduced role and inquired about needs patient had. Patient in need of ride to her car which is at social services. She was transported to the hospital by EMS. Patient currently living in Extended Stay. She has already received shelter resources from [place] earlier in hospitalization but CSW will provide more information. No further concerns. CSW encouraged patient to contact CSW as needed. CSW will sign off for now. Consult if any other social work needs arise.  Employment status:  [employer] information:  Self Pay (Medicaid Pending) PT Recommendations:  Not assessed at this time Information / Referral to community resources:  Shelter  Patient/Family's Response to care:  Patient agreeable to resources. Patient polite and appreciated social work  intervention.  Patient/Family's Understanding of and Emotional Response to Diagnosis, Current Treatment, and Prognosis:  Patient understands medical interventions. Aware of possible discharge today or tomorrow (5/19 or 5/20)  Emotional Assessment Appearance:  Appears stated age Attitude/Demeanor/Rapport:   (Pleasant) Affect (typically observed):  Accepting, Appropriate, Calm, Pleasant Orientation:  Oriented to Self, Oriented to Place, Oriented to  Time, Oriented to Situation Alcohol / Substance use:  Never Used Psych involvement (Current and /or in the community):  No (Comment)  Discharge Needs  Concerns to be addressed:  Homelessness, Other (Comment Required (Transportation to car) Readmission within the last 30 days:  No Current discharge risk:  Dependent with Mobility, Homeless Barriers to Discharge:  Inadequate or no insurance   Candie Chroman, LCSW 04/01/2016, 3:03 PM

## 2016-04-01 NOTE — Progress Notes (Signed)
TR BAND REMOVAL  LOCATION:    right radial  DEFLATED PER PROTOCOL:    Yes.    TIME BAND OFF / DRESSING APPLIED:    0100   SITE UPON ARRIVAL:    Level 0  SITE AFTER BAND REMOVAL:    Level 0  CIRCULATION SENSATION AND MOVEMENT:    Within Normal Limits   Yes.    COMMENTS:   Pt stable, site cdi

## 2016-04-01 NOTE — Progress Notes (Signed)
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 No chest pain reported.   Inpatient Medications    . aspirin  81 mg Oral Daily  . atorvastatin  40 mg Oral q1800  . capsaicin   Topical TID WC & HS  . ferrous sulfate  325 mg Oral Q breakfast  . insulin aspart  0-5 Units Subcutaneous QHS  . insulin aspart  0-9 Units Subcutaneous TID WC  . insulin glargine  7 Units Subcutaneous QHS  . metoprolol succinate  50 mg Oral Daily  . sodium chloride flush  3 mL Intravenous Q12H  . sodium chloride flush  3 mL Intravenous Q12H  . ticagrelor  90 mg Oral BID    Vital Signs    Filed Vitals:   04/01/16 0000 04/01/16 0010 04/01/16 0020 04/01/16 0405  BP: 130/71 125/71 96/53 91/43   Pulse: 97 98 87 80  Temp:    97.5 F (36.4 C)  TempSrc:    Oral  Resp: 17 12 16 17   Height:      Weight:    199 lb 15.3 oz (90.7 kg)  SpO2: 100% 100% 100% 99%    Intake/Output Summary (Last 24 hours) at 04/01/16 0747 Last data filed at 04/01/16 0617  Gross per 24 hour  Intake 832.92 ml  Output   2350 ml  Net -1517.08 ml   Filed Weights   03/30/16 0529 03/31/16 0700 04/01/16 0405  Weight: 190 lb 6.4 oz (86.365 kg) 196 lb (88.905 kg) 199 lb 15.3 oz (90.7 kg)    Physical Exam    General: Pleasant, NAD. Neuro: Alert and oriented X 3. Moves all extremities spontaneously. Psych: Normal affect. HEENT:  Normal  Neck: Supple without bruits or JVD. Lungs:  Resp regular and unlabored, CTA. Heart: RRR no s3, s4, or murmurs. Abdomen: Soft,  non-tender, non-distended, BS + x 4.  Extremities: No clubbing, cyanosis or edema. DP/PT/Radials 2+. Cast to the left lower leg. Right radial/groin site stable with no hematoma or bruising.   Labs    CBC  Recent Labs  03/31/16 0818 04/01/16 0547  WBC 4.0 5.6  HGB 8.6* 7.1*  HCT 26.1* 21.9*  MCV 78.1 80.5  PLT 266 [id number]   Basic Metabolic Panel  Recent Labs  03/30/16 0324 03/31/16 0818  NA 140 140  K 4.3 4.2  CL 108 108  CO2 24 23  GLUCOSE 257* 153*  BUN 34* 17  CREATININE 1.57* 1.16*  CALCIUM 8.5* 8.6*   Cardiac Enzymes  Recent Labs  03/29/16 2127 03/30/16 0324 03/30/16 0851  TROPONINI <0.03 <0.03 <0.03    Telemetry    SR Rate-80s  ECG    SR Rate-90 No acute ST/T wave changes.   Radiology      Assessment & Plan   40 yo female with PMH of HTN/HLD/DM II/asthma/abnormal uterine bleeding/Iron deficiency anemia/Charcot Foot and tobacco use who presented to the ED with chest pain 03/29/2016.   1. Chest pain/ angina: Ruled out for MI with neg trop x3.  Received a stress test that results as intermediate risk, with inferior ischemia. Underwent LHC yesterday with Dr. Gwenlyn Found with a BMS placed to the proximal LAD. EF noted to be normal. Started on Brilinta post intervention. Will change to Plavix given her drop in Hgb this morning to reduce bleeding risk.  --No reports of chest pain this morning.  --Right radial/groin site stable without bleeding.   2. CKD 3- Cr with slight bump this morning, from 1.16>>1.38. But improved from admission with fluids.   3. Anemia with hx of uterine bleeding:  Hgb with a downward trend to 7.1 this morning post PCI. Reports vaginal bleeding.  --Will order transfusion for 1 unit this morning, I have discussed this plan with the patient and Dr. [name]. --May need a consult to her OB/GYN, she reports having discussed possible transfusion in the past but they attempted to use the megace.  Will defer this to primary team.   4. DM-2 poorly  controlled with hgbA1c 9.2 per IM  5. HTN - elevated at times, but soft this morning with reading on the monitor of 107/80. On ASA, toprol 50  6. Hyperlipidemia With LDL in 05/2015 of 146 Tchol 243, TG 158 HDL 65. Statin added yesterday.   Signed, Reino Bellis NP-C Pager [phone]

## 2016-04-01 NOTE — Progress Notes (Signed)
Subjective: Jane [name] underwent cath yesterday, tolerated well. This morning, she denies CP, palpitations, SOB. She denies recurrent N/V, but does have some recurrent dizziness again this morning. She says that her uterine bleeding, chronic for years, was a little bit worse yesterday afternoon s/p cath, but otherwise denies melena or hematochezia or other apparent signs of bleeding.  Objective: Vital signs in last 24 hours: Filed Vitals:   04/01/16 0300 04/01/16 0400 04/01/16 0405 04/01/16 0500  BP: 97/46 94/42 91/43  87/41  Pulse: 78 77 80 77  Temp:   97.5 F (36.4 C)   TempSrc:   Oral   Resp: 17 20 17 16   Height:      Weight:   199 lb 15.3 oz (90.7 kg)   SpO2: 98% 98% 99% 98%     Gen: Well-appearing, alert and oriented to person, place, and time HEENT: Oropharynx clear without erythema or exudate.  Neck: No cervical LAD, no thyromegaly or nodules, no JVD noted. CV: Normal rate, regular rhythm, soft 1/6 blowing systolic murmur at LSB that is non-radiating, rubs, or gallops. Pain reproducible on palpation on L sternum. Pulmonary: Normal effort, CTA bilaterally, no crackles or wheezes Abdominal: Soft, non-tender, non-distended, without rebound, guarding, or masses Extremities: Distal pulses 2+ in upper and lower extremities bilaterally, no tenderness, erythema or edema. L foot in cast. Skin: No atypical appearing moles. No rashes  Lab Results: Basic Metabolic Panel:  Recent Labs Lab 03/31/16 0818 04/01/16 0547  NA 140 141  K 4.2 4.3  CL 108 112*  CO2 23 22  GLUCOSE 153* 133*  BUN 17 19  CREATININE 1.16* 1.38*  CALCIUM 8.6* 8.3*   CBC:  Recent Labs Lab 03/31/16 0818 04/01/16 0547  WBC 4.0 5.6  HGB 8.6* 7.1*  HCT 26.1* 21.9*  MCV 78.1 80.5  PLT 266 220   Cardiac Enzymes:  Recent Labs Lab 03/29/16 2127 03/30/16 0324 03/30/16 0851  TROPONINI <0.03 <0.03 <0.03   Assessment/Plan: 1. Atypical chest pain in a moderate CV risk patient s/p  - EKG with  borderline Q waves vs normal EKG, troponins negative x 3, CXR clear but in patient with extensive RFs. Her current presentation does seem more consistent with musculoligamentous strain than cardiac pathology, although she is at high risk for underlying CV disease. Myoview intermediate risk. EF was reduced on Myoview but normal on TTE, which also showed grade 1 diastolic dysfunction and mild TR. Underwent LHC yesterday showing 90% stenosis of Ost LAD to mid LAD, now s/p BMS to proximal LAD. -Cardiology following; appreciate recs. Started on [place]. Given that she can barely afford a statin, she will need medical assistance program for this. Talked with CSW this morning, will assist. She is being given 30-day free with assistance program thereafter -Resume atorvastatin (was previously on but not recently) -Will d/c HCTZ, but continue on lisinopril, metoprolol. BP recheck in our clinic in a week.  2. Orthostatic hypotension - symptoms more c/w orthostatic hypotension from dehydration + her medications than a cardiac etiology. Her systolic murmur is almost certainly a benign flow murmur due to her anemia so I do not think these two are related. Positive orthostatics here and AKI as well to further support the diagnosis. Anemia and poor PO intake could make these symptoms worse as well. -Continue IVF -Ondansetron PRN for nausea -F/u BMPs. AKI improving with IVF here -Will continue lisinopril on discharge. Will discontinue HCTZ. Continue BB. Can increase dose of lisinopril, BB to titrate to DM SBP goal of <140  3. Anemia 2/2 abnormal uterine bleeding - baseline in the 8s, on iron and megace at home. Has been improved and stable, but with recent IVF for her AKI, and holding megace while receiving AC here, most likely worsened acutely from combination of dilution and increased AUB. No s/s of other sources, including GI. While we could simply repeat the 7.1 this morning first to see if this is artifactual,  let's go ahead with transfusion for goal of >8 given her CAD. -Follow post-transfusion CBC -Will need OP follow-up with her OBGYN  Dispo: Disposition is deferred at this time, awaiting improvement of current medical problems.  Anticipated discharge this afternoon or tomorrow.  The patient does have a current PCP (Juluis Mire, MD) and does need an [facility] hospital follow-up appointment after discharge.  The patient does not have transportation limitations that hinder transportation to clinic appointments.  LOS: 2 days   Norval Gable, MD 04/01/2016, 9:01 AM

## 2016-04-02 DIAGNOSIS — Z955 Presence of coronary angioplasty implant and graft: Secondary | ICD-10-CM | POA: Insufficient documentation

## 2016-04-02 DIAGNOSIS — R079 Chest pain, unspecified: Secondary | ICD-10-CM | POA: Insufficient documentation

## 2016-04-02 DIAGNOSIS — Z590 Homelessness: Secondary | ICD-10-CM

## 2016-04-02 LAB — BASIC METABOLIC PANEL
ANION GAP: 6 (ref 5–15)
BUN: 22 mg/dL — ABNORMAL HIGH (ref 6–20)
CO2: 23 mmol/L (ref 22–32)
Calcium: 8.4 mg/dL — ABNORMAL LOW (ref 8.9–10.3)
Chloride: 109 mmol/L (ref 101–111)
Creatinine, Ser: 1.37 mg/dL — ABNORMAL HIGH (ref 0.44–1.00)
GFR calc Af Amer: 55 mL/min — ABNORMAL LOW (ref >60)
GFR, EST NON AFRICAN AMERICAN: 47 mL/min — AB (ref >60)
GLUCOSE: 181 mg/dL — AB (ref 65–99)
Potassium: 4.2 mmol/L (ref 3.5–5.1)
Sodium: 138 mmol/L (ref 135–145)

## 2016-04-02 LAB — GLUCOSE, CAPILLARY
Glucose-Capillary: 219 mg/dL — ABNORMAL HIGH (ref 65–99)
Glucose-Capillary: 182 mg/dL — ABNORMAL HIGH (ref 65–99)
Glucose-Capillary: 184 mg/dL — ABNORMAL HIGH (ref 65–99)

## 2016-04-02 LAB — CBC
HEMATOCRIT: 23.5 % — AB (ref 36.0–46.0)
HEMATOCRIT: 31.3 % — AB (ref 36.0–46.0)
HEMOGLOBIN: 7.5 g/dL — AB (ref 12.0–15.0)
Hemoglobin: 9.9 g/dL — ABNORMAL LOW (ref 12.0–15.0)
MCH: 25.3 pg — ABNORMAL LOW (ref 26.0–34.0)
MCH: 24.9 pg — ABNORMAL LOW (ref 26.0–34.0)
MCHC: 31.9 g/dL (ref 30.0–36.0)
MCHC: 31.6 g/dL (ref 30.0–36.0)
MCV: 79.4 fL (ref 78.0–100.0)
MCV: 78.8 fL (ref 78.0–100.0)
PLATELETS: 243 10*3/uL (ref 150–400)
Platelets: 203 10*3/uL (ref 150–400)
RBC: 2.96 MIL/uL — ABNORMAL LOW (ref 3.87–5.11)
RBC: 3.97 MIL/uL (ref 3.87–5.11)
RDW: 17 % — ABNORMAL HIGH (ref 11.5–15.5)
RDW: 16.2 % — ABNORMAL HIGH (ref 11.5–15.5)
WBC: 4.5 10*3/uL (ref 4.0–10.5)
WBC: 5.8 10*3/uL (ref 4.0–10.5)

## 2016-04-02 LAB — PREPARE RBC (CROSSMATCH)

## 2016-04-02 MED ORDER — ASPIRIN 81 MG PO CHEW: 81.0000 mg | CHEWABLE_TABLET | Freq: Every day | ORAL | Status: DC

## 2016-04-02 MED ORDER — LISINOPRIL 20 MG PO TABS: 20.0000 mg | ORAL_TABLET | Freq: Every day | ORAL | Status: DC

## 2016-04-02 MED ORDER — SODIUM CHLORIDE 0.9 % IV SOLN: Freq: Once | INTRAVENOUS | Status: DC

## 2016-04-02 MED ORDER — SODIUM CHLORIDE 0.9 % IV BOLUS (SEPSIS)
1000.0000 mL | Freq: Once | INTRAVENOUS | Status: AC
  Administered 2016-04-02: 08:00:00 1000 mL via INTRAVENOUS

## 2016-04-02 MED ORDER — METOPROLOL SUCCINATE ER 50 MG PO TB24: 50.0000 mg | ORAL_TABLET | Freq: Every day | ORAL | Status: DC

## 2016-04-02 MED ORDER — ATORVASTATIN CALCIUM 40 MG PO TABS: 40.0000 mg | ORAL_TABLET | Freq: Every day | ORAL | Status: DC

## 2016-04-02 NOTE — Progress Notes (Signed)
CM received call from MD requesting charity for a knee scooter.  CM called [employer] rep, Tiffany to request.  [place] DME rep, Merry Proud to speak with pt concerning logistics of securing scooter on Monday, May 22 at the [employer].  No other CM needs were communicated.

## 2016-04-02 NOTE — Progress Notes (Signed)
Patient ID: Jane [name], female   DOB: 30-Apr-1976, 40 y.o.   MRN: [id number]     Subjective:   No chest pain or SOB this AM   Objective:   Temp:  [97 F (36.1 C)-98.9 F (37.2 C)] 97 F (36.1 C) (05/20 0327) Pulse Rate:  [84-91] 85 (05/20 0327) Resp:  [15-22] 19 (05/20 0327) BP: (100-175)/(38-91) 140/73 mmHg (05/20 0327) SpO2:  [99 %-100 %] 100 % (05/20 0327) Weight:  [202 lb 9.6 oz (91.9 kg)] 202 lb 9.6 oz (91.9 kg) (05/20 0327) Last BM Date: 03/30/16  Filed Weights   03/31/16 0700 04/01/16 0405 04/02/16 0327  Weight: 196 lb (88.905 kg) 199 lb 15.3 oz (90.7 kg) 202 lb 9.6 oz (91.9 kg)    Intake/Output Summary (Last 24 hours) at 04/02/16 0657 Last data filed at 04/02/16 0416  Gross per 24 hour  Intake 329.17 ml  Output    800 ml  Net -470.83 ml    Telemetry: NSR  Exam:  General: NAD  HEENT: sclera clear, throat clear  Resp: CTAB  Cardiac: RRR, no m/r/g, no jvd  GI: abdomen soft, NT, ND  MSK:no LE edema  Neuro: no focal deficits  Psych: appropriate affect  Lab Results:  Basic Metabolic Panel:  Recent Labs Lab 03/30/16 0324 03/31/16 0818 04/01/16 0547  NA 140 140 141  K 4.3 4.2 4.3  CL 108 108 112*  CO2 24 23 22   GLUCOSE 257* 153* 133*  BUN 34* 17 19  CREATININE 1.57* 1.16* 1.38*  CALCIUM 8.5* 8.6* 8.3*    Liver Function Tests: No results for input(s): AST, ALT, ALKPHOS, BILITOT, PROT, ALBUMIN in the last 168 hours.  CBC:  Recent Labs Lab 03/31/16 0818 04/01/16 0547 04/02/16 0325  WBC 4.0 5.6 4.5  HGB 8.6* 7.1* 7.5*  HCT 26.1* 21.9* 23.5*  MCV 78.1 80.5 79.4  PLT 266 220 203    Cardiac Enzymes:  Recent Labs Lab 03/29/16 2127 03/30/16 0324 03/30/16 0851  TROPONINI <0.03 <0.03 <0.03    BNP: No results for input(s): PROBNP in the last 8760 hours.  Coagulation:  Recent Labs Lab 03/31/16 0818  INR 1.07    ECG:   Medications:   Scheduled Medications: . aspirin  81 mg Oral Daily  . atorvastatin  40 mg  Oral q1800  . capsaicin   Topical TID WC & HS  . clopidogrel  75 mg Oral Daily  . ferrous sulfate  325 mg Oral Q breakfast  . insulin aspart  0-5 Units Subcutaneous QHS  . insulin aspart  0-9 Units Subcutaneous TID WC  . insulin glargine  7 Units Subcutaneous QHS  . metoprolol succinate  50 mg Oral Daily  . sodium chloride flush  3 mL Intravenous Q12H  . sodium chloride flush  3 mL Intravenous Q12H     Infusions:     PRN Medications:  sodium chloride, acetaminophen, albuterol, hydrALAZINE, morphine injection, ondansetron **OR** ondansetron (ZOFRAN) IV, ondansetron (ZOFRAN) IV, sodium chloride flush     Assessment/Plan   1. Chest pain/CAD - Ruled out for MI with neg trop x3. Received a stress test that results as intermediate risk, with inferior ischemia - Underwent LHC yesterday with Dr. Gwenlyn Found with a BMS placed to the proximal LAD. EF noted to be normal. - some post procedure anemia. Her baseline is around 8, dropped to 7.1 and now trending back up to 7.5 after transfusion. She has also had reportedly some vaginal bleeding. Right wrist and right groin cath sites look  good, no hematoma.  - DAPT with ASA/plavix, she is on statin and beta blocker. No ACE-I a tthis time due to labile renal function, her home lisionpril currently on hold.  F/u renal function and bp, if stable can restart  2. Anemia - per primary team   No further cardiac recs at this time. We will sign off inpatient care. Patient will need outpatient cardiology f/u 2-3 weeks after discharge.   Carlyle Dolly, M.D.

## 2016-04-02 NOTE — Progress Notes (Signed)
Patient trasfered from [facility] to [phone] via wheelchair; alert and oriented x 4; no complaints of pain; IV in left hand running NSsaline; skin intact. Orient patient to room and unit; instructed how to use the call bell and  fall risk precautions. Will continue to monitor the patient.

## 2016-04-02 NOTE — Progress Notes (Signed)
Physical Therapy Treatment Patient Details Name: Jane [name] MRN: [id number] DOB: 02-17-1976 Today's Date: 04/19/2016    History of Present Illness Pt admit with angina with stent placed yesterday in cath.  Radial sitewith clot that MD concerned about if pt puts too much weight on right UE.  Pt was using crutches therefore asked by MD to try knee walker.    PT Comments    Patient making good gains with mobility and gait with knee walker.  Follow Up Recommendations  No PT follow up     Equipment Recommendations  Other (comment) (knee walker)    Recommendations for Other Services       Precautions / Restrictions Precautions Precautions: Fall Required Braces or Orthoses: Other Brace/Splint Other Brace/Splint: Short-leg cast on LLE Restrictions Weight Bearing Restrictions: Yes RUE Weight Bearing: Partial weight bearing LLE Weight Bearing: Non weight bearing    Mobility  Bed Mobility Overal bed mobility: Independent                Transfers Overall transfer level: Needs assistance Equipment used:  (Knee walker) Transfers: Sit to/from Stand Sit to Stand: Min guard         General transfer comment: Reviewed technique.  Patient able to perform with min guard for safety.  Ambulation/Gait Ambulation/Gait assistance: Min guard Ambulation Distance (Feet): 160 Feet Assistive device:  (knee walker) Gait Pattern/deviations: Step-to pattern     General Gait Details: Patient able to lock/unlock brakes.  Increased distance today with increased comfort using knee walker.  Reviewed turns.  Encouraged minimal use of RUE for balance only   Stairs            Wheelchair Mobility    Modified Rankin (Stroke Patients Only)       Balance                                    Cognition Arousal/Alertness: Awake/alert Behavior During Therapy: WFL for tasks assessed/performed Overall Cognitive Status: Within Functional Limits for tasks  assessed                      Exercises      General Comments        Pertinent Vitals/Pain Pain Assessment: Faces Faces Pain Scale: Hurts little more Pain Location: LLE Pain Descriptors / Indicators: Constant Pain Intervention(s): Limited activity within patient's tolerance;Monitored during session;Repositioned    Home Living                      Prior Function            PT Goals (current goals can now be found in the care plan section) Progress towards PT goals: Progressing toward goals    Frequency  Min 3X/week    PT Plan Current plan remains appropriate    Co-evaluation             End of Session Equipment Utilized During Treatment: Gait belt Activity Tolerance: Patient tolerated treatment well Patient left: in bed;with call bell/phone within reach (sitting EOB with MD in room)     Time: [id number] PT Time Calculation (min) (ACUTE ONLY): 16 min  Charges:  $Gait Training: 8-22 mins                    G Codes:      Despina Pole 04/19/16, 8:40 PM Carita Pian. Rosana Hoes, PT,  [place] Pager [phone]

## 2016-04-02 NOTE — Progress Notes (Signed)
Patient was discharged by MD order to the Extended Stay; discharged instructions review and give to patient with care notes; IV DIC; skin intact; patient received a cab voucher to take her to the place; she will be escorted to the cab by nurse tech via wheelchair.

## 2016-04-02 NOTE — Progress Notes (Signed)
[id number] Pt is currently in cast and unable to use crutches in room at this time because she can't bear weight on upper body yet post-cath. Pt walked previously with physical therapy using a knee walker. Pt requested information regarding how to get more Iron in diet and regarding carbohydrate counting. Iron rich diet handout and Basic Carrbohydrate Counting for DM given.  Sol Passer, MS, ACSM CCEP

## 2016-04-02 NOTE — Progress Notes (Signed)
Subjective: Mrs. Goth had NAEON. This morning, she denies CP, palpitations, SOB. She denies recurrent N/V, or dizziness. Her uterine bleeding is stable, but otherwise denies melena or hematochezia or other apparent signs of bleeding.  Objective: Vital signs in last 24 hours: Filed Vitals:   04/01/16 1950 04/01/16 2000 04/02/16 0327 04/02/16 0715  BP:  100/38 140/73 132/67  Pulse:   85 84  Temp: 98.3 F (36.8 C)  97 F (36.1 C) 98.7 F (37.1 C)  TempSrc: Oral  Oral   Resp:  19 19 21   Height:      Weight:   202 lb 9.6 oz (91.9 kg)   SpO2:   100% 100%     Gen: Well-appearing, alert and oriented to person, place, and time HEENT: Oropharynx clear without erythema or exudate.  Neck: No cervical LAD, no thyromegaly or nodules, no JVD noted. CV: Normal rate, regular rhythm, soft 1/6 blowing systolic murmur at LSB that is non-radiating, rubs, or gallops. Pain reproducible on palpation on L sternum. Pulmonary: Normal effort, CTA bilaterally, no crackles or wheezes Abdominal: Soft, non-tender, non-distended, without rebound, guarding, or masses Extremities: Distal pulses 2+ in upper and lower extremities bilaterally, no tenderness, erythema or edema. L foot in cast. Skin: No atypical appearing moles. No rashes  Lab Results: Basic Metabolic Panel:  Recent Labs Lab 04/01/16 0547 04/02/16 0728  NA 141 138  K 4.3 4.2  CL 112* 109  CO2 22 23  GLUCOSE 133* 181*  BUN 19 22*  CREATININE 1.38* 1.37*  CALCIUM 8.3* 8.4*   CBC:  Recent Labs Lab 04/01/16 0547 04/02/16 0325  WBC 5.6 4.5  HGB 7.1* 7.5*  HCT 21.9* 23.5*  MCV 80.5 79.4  PLT 220 203   Cardiac Enzymes:  Recent Labs Lab 03/29/16 2127 03/30/16 0324 03/30/16 0851  TROPONINI <0.03 <0.03 <0.03   Assessment/Plan: 1. Atypical chest pain in a moderate CV risk patient s/p  - EKG with borderline Q waves vs normal EKG, troponins negative x 3, CXR clear but in patient with extensive RFs. Her current presentation  does seem more consistent with musculoligamentous strain than cardiac pathology, although she is at high risk for underlying CV disease. Myoview intermediate risk. EF was reduced on Myoview but normal on TTE, which also showed grade 1 diastolic dysfunction and mild TR. Underwent LHC 5/18, showing 90% stenosis of Ost LAD to mid LAD, now s/p BMS to proximal LAD. -Cardiology following; appreciate recs. On clopidogrel and ASA. CM provided med assistance for clopidogrel. Also arranged appt with Marlana Latus in our clinic for [facility] and other services -Will d/c HCTZ, but continue on lisinopril, metoprolol. BP recheck in our clinic in a week.  2. Orthostatic hypotension - symptoms more c/w orthostatic hypotension from dehydration + her medications than a cardiac etiology. Her systolic murmur is almost certainly a benign flow murmur due to her anemia so I do not think these two are related. Positive orthostatics here and AKI as well to further support the diagnosis. Anemia and poor PO intake could make these symptoms worse as well. Repeat orthostatics still positive post-cath. Will need to follow up outpatient. -Continue IVF -Ondansetron PRN for nausea -F/u BMPs. AKI improving with IVF here -Will continue lisinopril on discharge. Will discontinue HCTZ. Continue BB. Can increase dose of lisinopril, BB to titrate to DM SBP goal of <140  3. Anemia 2/2 abnormal uterine bleeding - baseline in the 8s, on iron and megace at home. Has been improved and stable, but with  recent IVF for her AKI, and holding megace while receiving AC here, most likely worsened acutely from combination of dilution and increased AUB. No s/s of other sources, including GI. While we could simply repeat the 7.1 this morning first to see if this is artifactual, let's go ahead with transfusion for goal of >8 given her CAD. Received 1 uPRBC yesterday, post 7.5. -Transfuse 1 more unit PRBCs today -Will need OP follow-up with her OBGYN  Dispo:  Disposition is deferred at this time, awaiting improvement of current medical problems.  Anticipated discharge this afternoon or tomorrow.  The patient does have a current PCP (Juluis Mire, MD) and does need an [facility] hospital follow-up appointment after discharge.  The patient does not have transportation limitations that hinder transportation to clinic appointments.  LOS: 3 days   Norval Gable, MD 04/02/2016, 9:30 AM

## 2016-04-02 NOTE — Clinical Social Work Note (Signed)
Clinical Social Worker received phone call stating that patient is being discharged with no place to go. CSW met with patient in reference to homelessness issues. Patient stated that she has been living in an Extended Stay for the past 2 weeks and has exhausted funds to go back. Patient stated that she was living with her sister before 2 week hotel stay and further reported that she voluntarily left her sister's house and went to live with a friend. Patient stated that currently her family is out of town however her friend will pay for 1-2 more nights at an Extended Stay until family returns to town. Patient stated family will return tomorrow, 5/21 as she has planned to stay with her sister.   CSW provided shelter list. CSW also answered questions in regards to obtaining equipment from [facility] and encouraged patient to contact RNCM with further questions in regards home health care.   Patient also requesting cab voucher to car which is located at [employer] on [address]. CSW to provide cab voucher to bedside RN.   No further concerns reported at this time. Clinical Social Worker will sign off for now as social work intervention is no longer needed. Please consult [place] again if new need arises.  Glendon Axe, MSW, LCSWA [phone] 04/02/2016 5:01 PM

## 2016-04-03 LAB — TYPE AND SCREEN
ABO/RH(D): O POS
Antibody Screen: NEGATIVE
Unit division: 0
Unit division: 0

## 2016-04-04 NOTE — Discharge Summary (Signed)
Name: [name] MRN: [id number] DOB: [birth year] 40 y.o. PCP: Juluis Mire, MD  Date of Admission: 03/29/2016  7:39 PM Date of Discharge: 04/04/2016 Attending Physician: No att. providers found  Discharge Diagnosis: 1. Atypical chest pain 2. Abnormal uterine bleeding 3. Orthostatic hypotension 4. 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, with long-term current use of insulin (HCC)   Hyperlipidemia   Iron deficiency anemia   Hypertension   Atypical angina (HCC)   Abnormal nuclear stress test   Orthostatic hypotension   Presence of bare metal stent in LAD coronary artery   Pain in the chest   Stented coronary artery  Discharge Medications:   Medication List    STOP taking these medications        aspirin EC 81 MG tablet  Replaced by:  aspirin 81 MG chewable tablet     lisinopril-hydrochlorothiazide 20-25 MG tablet  Commonly known as:  PRINZIDE,ZESTORETIC      TAKE these medications        acetaminophen 500 MG tablet  Commonly known as:  TYLENOL  Take 1 tablet (500 mg total) by mouth every 6 (six) hours as needed for headache.     albuterol 108 (90 Base) MCG/ACT inhaler  Commonly known as:  PROVENTIL HFA;VENTOLIN HFA  Inhale 2 puffs into the lungs every 4 (four) hours as needed for wheezing or shortness of breath (or coughing).     aspirin 81 MG chewable tablet  Chew 1 tablet (81 mg total) by mouth daily.     atorvastatin 40 MG tablet  Commonly known as:  LIPITOR  Take 1 tablet (40 mg total) by mouth daily at 6 PM.     clopidogrel 75 MG tablet  Commonly known as:  PLAVIX  Take 1 tablet (75 mg total) by mouth daily.     ferrous sulfate 325 (65 FE) MG tablet  Take 325 mg by mouth daily with breakfast.     ibuprofen 800 MG tablet  Commonly known as:  ADVIL,MOTRIN  Take 1 tablet (800 mg total) by mouth every 8 (eight)  hours.     insulin aspart protamine- aspart (70-30) 100 UNIT/ML injection  Commonly known as:  NOVOLOG MIX 70/30  Inject 0.18 mLs (18 Units total) into the skin 2 (two) times daily with a meal.     lisinopril 20 MG tablet  Commonly known as:  PRINIVIL,ZESTRIL  Take 1 tablet (20 mg total) by mouth daily.     MEGACE ES PO  Take 1 tablet by mouth 2 (two) times daily. Patient states she takes an unknown dose of this medication twice daily last dose 03/12/2016     metFORMIN 500 MG tablet  Commonly known as:  GLUCOPHAGE  Take 1 tablet (500 mg total) by mouth 2 (two) times daily with a meal.     metoprolol succinate 50 MG 24 hr tablet  Commonly known as:  TOPROL XL  Take 1 tablet (50 mg total) by mouth daily. Take with or immediately following a meal.        Disposition and follow-up:   [name] was discharged from [facility] in Good condition.  At the hospital follow up visit please address:  1.  Has she had recurrence of chest pain? -Is she still getting lightheaded/dizzy/nauseous and if so, is it improving?  -Did she get the knee walker?  -If her BPs are  still elevated, consider increasing the lisinopril +/- metoprolol -If she is still dizzy/lightheaded, consider checking orthostatics -Follow-up regarding her current living and job situation  2.  Labs / imaging needed at time of follow-up: BMP, CBC, referral back to her OB-GYN regarding management of abnormal uterine bleeding  3.  Pending labs/ test needing follow-up: None  Follow-up Appointments:     Follow-up Information    Follow up with [facility]. Schedule an appointment as soon as possible for a visit in 2 weeks.   Why:  cardiology follow up   Contact information:   [place] [phone]      Discharge Instructions: Discharge Instructions    AMB Referral to Cardiac Rehabilitation -  Phase II    Complete by:  As directed   Diagnosis:  Coronary Stents     Amb Referral to Cardiac Rehabilitation    Complete by:  As directed   Diagnosis:  Coronary Stents     Diet - low sodium heart healthy    Complete by:  As directed      Increase activity slowly    Complete by:  As directed            Consultations: Treatment Team:  Rounding L[facility]cardiology, MD  Procedures Performed:  Dg Chest 2 View  03/29/2016  CLINICAL DATA:  Chest pain for 1 day EXAM: CHEST  2 VIEW COMPARISON:  01/03/2016 FINDINGS: Cardiac shadow is within normal limits. The lungs are clear bilaterally. No acute bony abnormality is noted. IMPRESSION: No active cardiopulmonary disease. Electronically Signed   By: Inez Catalina M.D.   On: 03/29/2016 16:10   Dg Ankle Complete Left  03/13/2016  CLINICAL DATA:  Left foot and ankle swelling and pain. Injury on stairs. Diabetes. EXAM: LEFT ANKLE COMPLETE - 3+ VIEW COMPARISON:  None. FINDINGS: Soft tissue swelling that is extensive. No acute fracture subluxation at the ankle. There is disorganized fragmentation and widening at the Chopart joint. IMPRESSION: 1. No acute osseous finding at the ankle. 2. 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 Electronically Signed   By: Monte Fantasia M.D.   On: 03/13/2016 01:15   Ct Foot Left Wo Contrast  03/13/2016  CLINICAL DATA:  Acute onset of left foot pain and swelling. Initial encounter. EXAM: CT OF THE LEFT ANKLE WITHOUT CONTRAST CT OF THE LEFT FOOT WITHOUT CONTRAST TECHNIQUE: Multidetector CT imaging of the left ankle and foot was performed according to the standard protocol. Multiplanar CT image reconstructions were also generated. COMPARISON:  Left ankle and foot radiographs performed earlier today at 12:42 a.m. FINDINGS: Extensive changes of Charcot joint are again noted at the midfoot, with marked irregularity and partial collapse of the cuneiforms and cuboid, and partial collapse of the bases of the metatarsals. Multiple erosions are  noted at the anterior talus. There is medial dislocation of part of the navicular, with diffuse resorption of the remainder of the navicular. Mild erosions are also noted at the anterior calcaneus. The ankle mortise is grossly unremarkable in appearance. The interosseous space is preserved. Marked soft tissue inflammation is noted at the midfoot, with diffuse soft tissue edema tracking about the forefoot and ankle. The flexor and extensor tendons are grossly unremarkable, though difficult to fully assess given the extent of soft tissue inflammation. The peroneal tendons are also difficult fully characterize. IMPRESSION: Extensive changes of Charcot joint again noted at the midfoot, with marked irregularity and partial collapse of the cuneiforms and  cuboid, partial collapse of the bases of the metatarsals, multiple erosions at the anterior talus and calcaneus, and medial dislocation of part of the navicular. Marked surrounding soft inflammation, with edema tracking about the foot and ankle. Electronically Signed   By: Garald Balding M.D.   On: 03/13/2016 03:29   Ct Ankle Left Wo Contrast  03/13/2016  CLINICAL DATA:  Acute onset of left foot pain and swelling. Initial encounter. EXAM: CT OF THE LEFT ANKLE WITHOUT CONTRAST CT OF THE LEFT FOOT WITHOUT CONTRAST TECHNIQUE: Multidetector CT imaging of the left ankle and foot was performed according to the standard protocol. Multiplanar CT image reconstructions were also generated. COMPARISON:  Left ankle and foot radiographs performed earlier today at 12:42 a.m. FINDINGS: Extensive changes of Charcot joint are again noted at the midfoot, with marked irregularity and partial collapse of the cuneiforms and cuboid, and partial collapse of the bases of the metatarsals. Multiple erosions are noted at the anterior talus. There is medial dislocation of part of the navicular, with diffuse resorption of the remainder of the navicular. Mild erosions are also noted at the anterior  calcaneus. The ankle mortise is grossly unremarkable in appearance. The interosseous space is preserved. Marked soft tissue inflammation is noted at the midfoot, with diffuse soft tissue edema tracking about the forefoot and ankle. The flexor and extensor tendons are grossly unremarkable, though difficult to fully assess given the extent of soft tissue inflammation. The peroneal tendons are also difficult fully characterize. IMPRESSION: Extensive changes of Charcot joint again noted at the midfoot, with marked irregularity and partial collapse of the cuneiforms and cuboid, partial collapse of the bases of the metatarsals, multiple erosions at the anterior talus and calcaneus, and medial dislocation of part of the navicular. Marked surrounding soft inflammation, with edema tracking about the foot and ankle. Electronically Signed   By: Garald Balding M.D.   On: 03/13/2016 03:29   Nm Myocar Multi W/spect W/wall Motion / Ef  03/30/2016   There was no ST segment deviation noted during stress.  No T wave inversion was noted during stress.  This is an intermediate risk study.  The left ventricular ejection fraction is moderately decreased (30-44%).  1. Reversible, medium-sized, mild basal to mid inferior perfusion defect.  This is concerning for ischemia.  However, there was significant gut uptake on this study so cannot rule out artifact. 2. EF 38% with diffuse hypokinesis, appears worse inferiorly. 3. Intermediate risk study.   Dg Foot Complete Left  03/13/2016  CLINICAL DATA:  Left foot and ankle swelling and pain. Fall on stairs today. Diabetes. EXAM: LEFT FOOT - COMPLETE 3+ VIEW COMPARISON:  None. FINDINGS: There is fragmentation and disorganization of the entire midfoot with sclerosis and regional soft tissue swelling. Chopart joint is essentially disarticulated. Lateral Lisfranc joint is more severely affected than medial. There is a comminuted fracture to the base of the second proximal phalanx with  displacement along the articular surface this fracture appears chronic given margins and mature periosteal reaction along the phalangeal shaft. Chronic third metatarsal head fracture with flattening and sclerosis. No opaque foreign body. IMPRESSION: 1. Charcot midfoot. 2. Chronic appearing displaced intra-articular fracture of the second proximal phalanx. 3. Chronic third metatarsal head fracture. Electronically Signed   By: Monte Fantasia M.D.   On: 03/13/2016 01:19    2D Echo: Study Conclusions 03/30/2016  - Left ventricle: The cavity size was normal. Wall thickness was  normal. Systolic function was normal. The estimated ejection  fraction was  in the range of 60% to 65%. Wall motion was normal;  there were no regional wall motion abnormalities. Doppler  parameters are consistent with abnormal left ventricular  relaxation (grade 1 diastolic dysfunction). - Tricuspid valve: There was mild regurgitation.  Cardiac Cath: on 03/31/16     Admission HPI: [name] is a 40 year old female with PMH of Insulin dependent T2DM, HTN, PAD, HLD, Abnormal Uterine bleeding, Iron deficiency, and history of tobacco use who presents with complaint of chest pain. She says she was outside yesterday around 2:45 pm, when she began to feel diaphoretic, nauseous, and dizzy, which she thought was related to the heat. She says she has not had good oral intake the last couple of days due to feeling unwell. She states that she has been under a lot of stress lately due to financial and insurance issues. She was at social workers office earlier today to discuss insurance options, when she began to feel a tight/pressure-like substernal chest pain, rated 9/10 in intensity, without radiation to the arms, neck, jaw, or back. She reports associated nausea and diaphoresis, but no shortness of breath. Her pain was on and off for approximately 10-15 minutes. EMS were called and by the time they arrived, her pain had  resolved. She did not receive sublingual nitroglycerin. She says her pain can be reproduced with palpation.   She says she has had chest pain in the past related to cough from pneumonia, but nothing similar to what she experienced today. Patient reports that for the past 1-2 months she has had intermittent episodes of dizziness, nausea, diaphoresis, and confusion. She reports these seemed to be related to her uterine bleeding, which are apparently improving after she was started on Megace to control her bleeding. She also was recently diagnosed with left sided Charcot foot which was likely present for some time until this month due to lack of radiographic evidence. She had a cast placed on her left foot last week. She denies any history of blood clots in the legs or lungs or family history of the same.  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 Atypical chest pain - EKG with Q waves of questionable significance vs normal EKG, troponins negative x 3, CXR clear. Her current presentation did seem more consistent with musculoligamentous strain than cardiac pathology given its reproducibility on palpation and initiation shortly after starting to use crutched, although she is at high risk for underlying CV disease and  cardiology felt this reflected unstable angina. Myoview nuclear stress test 03/30/16 was intermediate risk, showing possible inferior ischemia. She then underwent LHC on 03/31/16, revealing 90% proximal LAD stenosis with 0% residual after bare-metal stent placement. No other abnormalities were identified. She was started on ASA and clopidogrel and continued on her other  current medications, with close follow-up in our clinic and with cardiology.  2. Abnormal uterine bleeding - baseline Hgb in the 8s, on iron and megace at home for AUB, followed by OB-GYN. With receiving IV fluids for her AKI, and holding megace while receiving anticoagulation here, these measures likely worsened her anemia down to a Hgb of 7.1 from a combination of dilution and increased AUB, which the patient said was mildly worse than when taking megace. She had no s/s of other sources, including GI bleed. She was transfused 2U PRBCs with a post-transfusion CBC of 9.9. Megace was held on discharge given her immobility from Charcot foot in a cast for the next few weeks, with instructions to make a referral to her OB-GYN for repeat evaluation for AUB to be made at hospital follow-up.  3. Orthostatic hypotension - the patient presented with dizziness/lightheadedness/nausea, with positive orthostatic hypotension noted on admission. She was given IV fluid hydration and her HCTZ was held. She was continued on lisinopril and metoprolol given her T2DM and CAD, respectively. She was still orthostatic on repeat check near discharge but to a lesser degree and with improvement in her symptoms, despite aggressive IV hydration.  4. Financial hardship, homelessness - She expressed an inability to pay for medications currently and in the past, as well as difficulty with obtaining work given her inability to ambulate effectively while casted for Charcot joint. Clopidogrel was provided to her here, with arrangements to meet with Marlana Latus in our clinic for  [facility]. She was able to be discharged to an extended stay with plans to move back in with her sister thereafter.  Discharge Vitals:   BP 150/86 mmHg  Pulse 97  Temp(Src) 98 F (36.7 C) (Axillary)  Resp 20  Ht 5\' 5"  (1.651 m)  Wt 199 lb 3.2 oz (90.357 kg)  BMI 33.15 kg/m2  SpO2 99%  LMP 03/25/2016  Discharge Labs:  Results for orders placed or performed during the hospital encounter of 03/29/16 (from the past 24 hour(s))  BLOOD TRANSFUSION REPORT - SCANNED     Status: None   Collection Time: 04/04/16 12:15 PM   Narrative   Ordered by an unspecified provider.    Signed: Norval Gable, MD 04/04/2016, 2:59 PM    Services Ordered on Discharge: Marlana Latus, med assistance program Equipment Ordered on Discharge: Knee rolling walker

## 2016-04-08 ENCOUNTER — Encounter: Payer: Self-pay | Admitting: Internal Medicine

## 2016-04-08 ENCOUNTER — Telehealth: Payer: Self-pay | Admitting: Pharmacist

## 2016-04-08 ENCOUNTER — Ambulatory Visit: Payer: Self-pay | Admitting: Pharmacist

## 2016-04-08 ENCOUNTER — Ambulatory Visit (INDEPENDENT_AMBULATORY_CARE_PROVIDER_SITE_OTHER): Payer: Self-pay | Admitting: Internal Medicine

## 2016-04-08 VITALS — BP 133/68 | HR 85 | Temp 98.3°F | Ht 65.0 in | Wt 202.5 lb

## 2016-04-08 DIAGNOSIS — E113493 Type 2 diabetes mellitus with severe nonproliferative diabetic retinopathy without macular edema, bilateral: Secondary | ICD-10-CM

## 2016-04-08 DIAGNOSIS — I2510 Atherosclerotic heart disease of native coronary artery without angina pectoris: Secondary | ICD-10-CM

## 2016-04-08 DIAGNOSIS — I10 Essential (primary) hypertension: Secondary | ICD-10-CM

## 2016-04-08 DIAGNOSIS — Z794 Long term (current) use of insulin: Secondary | ICD-10-CM

## 2016-04-08 DIAGNOSIS — Z599 Problem related to housing and economic circumstances, unspecified: Secondary | ICD-10-CM | POA: Insufficient documentation

## 2016-04-08 DIAGNOSIS — Z79899 Other long term (current) drug therapy: Secondary | ICD-10-CM

## 2016-04-08 DIAGNOSIS — N938 Other specified abnormal uterine and vaginal bleeding: Secondary | ICD-10-CM

## 2016-04-08 DIAGNOSIS — E11610 Type 2 diabetes mellitus with diabetic neuropathic arthropathy: Secondary | ICD-10-CM

## 2016-04-08 DIAGNOSIS — Z955 Presence of coronary angioplasty implant and graft: Secondary | ICD-10-CM

## 2016-04-08 DIAGNOSIS — Z9861 Coronary angioplasty status: Secondary | ICD-10-CM

## 2016-04-08 DIAGNOSIS — Z7902 Long term (current) use of antithrombotics/antiplatelets: Secondary | ICD-10-CM

## 2016-04-08 DIAGNOSIS — Z7982 Long term (current) use of aspirin: Secondary | ICD-10-CM

## 2016-04-08 DIAGNOSIS — Z598 Other problems related to housing and economic circumstances: Secondary | ICD-10-CM

## 2016-04-08 LAB — GLUCOSE, CAPILLARY: GLUCOSE-CAPILLARY: 184 mg/dL — AB (ref 65–99)

## 2016-04-08 MED ORDER — PRAVASTATIN SODIUM 40 MG PO TABS
40.0000 mg | ORAL_TABLET | Freq: Every evening | ORAL | Status: DC
Start: 2016-04-08 — End: 2016-04-21

## 2016-04-08 MED ORDER — METFORMIN HCL 500 MG PO TABS: 500.0000 mg | ORAL_TABLET | Freq: Two times a day (BID) | ORAL | Status: DC

## 2016-04-08 NOTE — Progress Notes (Signed)
Subjective:    Patient ID: Jane [name], female    DOB: February 10, 1976, 40 y.o.   MRN: [id number]  HPI  Ms. Durrer is a 40 year old woman with a PMH if iron deficiency anemia, uncontrolled T2DM, Charcot foot, DUB who comes to the clinic for hospital follow up for hypotension, s/p stenting to the proximal LAD, and to discuss her chronic medical and social issues.   Type 2 Diabetes: Patient brings her glucometer today. Log shows blood glucoses in the 150-250 range throughout the day, without a clear pattern around her meals. She denies any episodes of symptomatic hypoglycemia. She has been adherent with Novolog 70/30 18U BID. She is concerned about running out of her insulin due to cost. She has not started the metformin which was prescribed during the last clinic visit.  CAD: Patient is doing well after her stenting during her last hospital admission. She denies any chest pain or shortness of breath on exertion. She has been adherent with ASA and Plavix. She says she has plenty of Plavix now, but is concerned about running out of it in the future.   HTN: Patient has not been taking any medications other than metoprolol for her blood pressure. She denies any headache or vision changes. She was previously on Lisinopril 20 mg.   Charcot Foot: Patient had an appointment with orthopedic surgery on Wednesday. She now has a rolling scooter to rest her left foot on. This has significantly improved her mobility and she is much more active than she was previously.  Financial Difficulties: Ms. Gulden expresses great concern about her ability to provider for herself. She is grateful for the [facility] paperwork during the last visit. Unfortunately, [employer] (2016 Earnings $[employer]5 B[employer] her previous employer, has no desk work available for her, and therefore, she was terminated. As a result, she has lost her insurance. She currently stays at a women's shelter, but she can only stay for for one  more month. She is using the [employer] shelter services to obtain vocational training. She is worried about her ability to afford her medications. She currently has >$50,000 in medical expenses that she has no way of paying back.   Dysfunctional Uterine Bleeding: Patient has had a return of DUB symptoms with clots since her Megace was after the last admission due to immobility. Her mobility has significantly improved and spends ~20% of her day using her scooter.   Review of Systems  Constitutional: Negative for fever and fatigue.  HENT: Negative for congestion and sore throat.   Eyes: Negative for redness and itching.  Respiratory: Negative for cough and shortness of breath.   Cardiovascular: Negative for chest pain and palpitations.  Gastrointestinal: Negative for abdominal pain and constipation.  Endocrine: Negative for polydipsia, polyphagia and polyuria.  Genitourinary: Positive for vaginal bleeding and menstrual problem.  Musculoskeletal: Positive for arthralgias and gait problem. Negative for back pain.  Skin: Negative for rash and wound.  Neurological: Negative for dizziness, light-headedness and headaches.  Psychiatric/Behavioral: Negative for dysphoric mood. The patient is nervous/anxious.        Objective:   Physical Exam  Constitutional: No distress.  HENT:  Mouth/Throat: Oropharynx is clear and moist. No oropharyngeal exudate.  Eyes: Conjunctivae are normal. No scleral icterus.  Neck: Neck supple.  Cardiovascular: Normal rate, regular rhythm and normal heart sounds.   Pulmonary/Chest: Effort normal and breath sounds normal. No respiratory distress. She has no wheezes.  Abdominal: Soft. Bowel sounds are normal. She exhibits no  distension. There is no tenderness.  Musculoskeletal:  Left foot in hard, pink cast. Foot scooter by her side.  Lymphadenopathy:    She has no cervical adenopathy.  Neurological: She is alert.  Tongue midline, face symmetric.  Skin: Skin is warm  and dry. She is not diaphoretic.  Psychiatric: She has a normal mood and affect. Her behavior is normal.  Vitals reviewed.         Assessment & Plan:    Please see problem based assessment and plan for details.

## 2016-04-08 NOTE — Patient Instructions (Signed)
Ms. Talbott,  It was so great to see you today.  For your diabetes, please take metformin 500 mg twice a day. You can pick this up at [facility]. We will keep your insulin the same for now. We have provided you a sample that should last you a little more than a month. If you feel you may run out, please call the clinic and we can work something out.  For your recent stent for your blockage, it is VERY important that you continue to take Aspirin and Plavix. If you feel you may run out, please call the clinic and we can work something out. For your cholesterol, we have temporarily switched you pravastatin 40 mg. You will take two tablets a day.  For your bleeding, since you are no long immobile, you can restart your Megace. We are making a referral to an OB/Gyn  For your blood pressure, it looks great today on the metoprolol only. Therefore, we will discontinue the lisinopril.  Please continue to work with Bonna Gains on financial assistance.  For weight loss, please download MyFitnessPal. Do not skip meals, because this is dangerous to do while you're on insulin.   I'm so proud of you, and I know you'll get through this.

## 2016-04-08 NOTE — Progress Notes (Signed)
Patient was recently discharged from hospital and all medications have been reviewed. Patient had a change in employment status and will need ongoing help with obtaining medications. Patient was referred to financial counselor. Will continue to provide support.

## 2016-04-08 NOTE — Progress Notes (Signed)
Medication Samples have been provided to the patient.  Drug: Novolog  Strength: 70/30 Qty: 1 vial LOT: [id number] Exp.Date: 07/2016 Dosing instructions: Inject 18 units into the skin two (2) times daily with meals.   The patient has been instructed regarding the correct time, dose, and frequency of taking this medication, including desired effects and most common side effects.   Samples signed for by [employer] Ellieanna Funderburg/Dr. Mannie Stabile.  Lendon Colonel  PharmD Candidate 2:04 PM 04/08/2016

## 2016-04-08 NOTE — Telephone Encounter (Signed)
Transition Care Management Follow-up Telephone Call   Date discharged? 04/04/16    How have you been since you were released from the hospital? Feeling better   Do you understand why you were in the hospital? yes   Do you understand the discharge instructions? yes   Where were you discharged to? home   Items Reviewed:  Medications reviewed: yes  Allergies reviewed: yes  Dietary changes reviewed: n/a  Referrals reviewed: yes  Functional Questionnaire:   Activities of Daily Living (ADLs):   She states they are independent in the following: ambulation, bathing and hygiene, feeding, continence, grooming, toileting and dressing States they require assistance with the following: none   Any transportation issues/concerns?: no   Any patient concerns? Patient needs assistance obtaining medications, referred to [facility] and will continue working with patient   Confirmed importance and date/time of follow-up visits scheduled yes  Provider Appointment booked with Dr. Marijean Bravo  Confirmed with patient if condition begins to worsen call PCP or go to the ER.  Patient was given the office number and encouraged to call back with question or concerns.  : yes

## 2016-04-09 LAB — BMP8+ANION GAP
ANION GAP: 16 mmol/L (ref 10.0–18.0)
BUN/Creatinine Ratio: 21 (ref 9–23)
BUN: 26 mg/dL — ABNORMAL HIGH (ref 6–24)
CALCIUM: 9.3 mg/dL (ref 8.7–10.2)
CHLORIDE: 99 mmol/L (ref 96–106)
CO2: 23 mmol/L (ref 18–29)
Creatinine, Ser: 1.25 mg/dL — ABNORMAL HIGH (ref 0.57–1.00)
GFR calc Af Amer: 62 mL/min/{1.73_m2} (ref >59)
GFR calc non Af Amer: 54 mL/min/{1.73_m2} — ABNORMAL LOW (ref >59)
GLUCOSE: 144 mg/dL — AB (ref 65–99)
POTASSIUM: 4.7 mmol/L (ref 3.5–5.2)
Sodium: 138 mmol/L (ref 134–144)

## 2016-04-09 LAB — CBC
HEMOGLOBIN: 9.9 g/dL — AB (ref 11.1–15.9)
Hematocrit: 31.2 % — ABNORMAL LOW (ref 34.0–46.6)
MCH: 25.6 pg — ABNORMAL LOW (ref 26.6–33.0)
MCHC: 31.7 g/dL (ref 31.5–35.7)
MCV: 81 fL (ref 79–97)
PLATELETS: 303 10*3/uL (ref 150–379)
RBC: 3.87 x10E6/uL (ref 3.77–5.28)
RDW: 16.6 % — AB (ref 12.3–15.4)
WBC: 5.5 10*3/uL (ref 3.4–10.8)

## 2016-04-09 NOTE — Assessment & Plan Note (Signed)
A: Jane [name] has had no chest pain or discomfort since her discharge. She has been adhering to her dual antiplatelet therapy. I informed her to contact the clinic as soon as possible if she is running out of her plavix or aspirin and is unable to afford it. She was agreeable with this plan.  P: Continue Plavix 75 mg daily and ASA 81 mg daily

## 2016-04-09 NOTE — Assessment & Plan Note (Signed)
A: Patient has been following up with orthopedics. She is unable to perform extensive physical work requiring walking because of this problem. 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  P: F/u with orthopedics.

## 2016-04-09 NOTE — Assessment & Plan Note (Signed)
A: Patient has been adherent with Novolog 70/30 18U BID. Her BGs have come down to the 150-250 range, much improved than the random 300s BG seen in clinic a week ago. She is worried that if we increase her insulin, she will run out and not be able to afford it. Metformin is on the $4 list at [facility]. She says she would be able to afford this, although she did not pick it up last week. We provided her a free sample of 1000U Novolog in clinic today.  P: Start metformin 500 mg bid Continue Novolog 70/30 18U BID

## 2016-04-09 NOTE — Assessment & Plan Note (Signed)
A: Patient has only been taking metoprolol 50 mg (24 hr tablet) daily. Her blood pressure is well controlled. She may not need any additional intervention, so I will remove lisinopril from her medication list given her difficulty affording her necessary medications.  P: Metoprolol succinate 50 mg daily

## 2016-04-09 NOTE — Assessment & Plan Note (Signed)
A: Patient now symptomatic since megace was held. She is much more mobile with scooter, and I feel her DVT risk is close to baseline now.  P: Restart Megace

## 2016-04-09 NOTE — Assessment & Plan Note (Signed)
A: Patient is very worried about her and her children's future and the ability to afford her medical care. She would like to speak with a Education officer, museum about her general situation. She could not pinpoint and specific questions, but did want to update the social worker on her current status to see what her options would be for affording her medical care and finding a job. FMLA paperwork was completed last week.  P: SW consult

## 2016-04-14 NOTE — Progress Notes (Signed)
Internal Medicine Clinic Attending  Case discussed with Dr. Ford at the time of the visit.  We reviewed the resident's history and exam and pertinent patient test results.  I agree with the assessment, diagnosis, and plan of care documented in the resident's note.  

## 2016-04-19 ENCOUNTER — Encounter: Payer: Self-pay | Admitting: Cardiology

## 2016-04-19 ENCOUNTER — Ambulatory Visit (INDEPENDENT_AMBULATORY_CARE_PROVIDER_SITE_OTHER): Payer: Self-pay | Admitting: Cardiology

## 2016-04-19 VITALS — BP 115/72 | HR 90 | Ht 65.0 in | Wt 202.0 lb

## 2016-04-19 DIAGNOSIS — Z9861 Coronary angioplasty status: Secondary | ICD-10-CM

## 2016-04-19 DIAGNOSIS — Z659 Problem related to unspecified psychosocial circumstances: Secondary | ICD-10-CM | POA: Insufficient documentation

## 2016-04-19 DIAGNOSIS — Z609 Problem related to social environment, unspecified: Secondary | ICD-10-CM

## 2016-04-19 DIAGNOSIS — Z794 Long term (current) use of insulin: Secondary | ICD-10-CM

## 2016-04-19 DIAGNOSIS — I2510 Atherosclerotic heart disease of native coronary artery without angina pectoris: Secondary | ICD-10-CM

## 2016-04-19 DIAGNOSIS — E083413 Diabetes mellitus due to underlying condition with severe nonproliferative diabetic retinopathy with macular edema, bilateral: Secondary | ICD-10-CM

## 2016-04-19 DIAGNOSIS — E11610 Type 2 diabetes mellitus with diabetic neuropathic arthropathy: Secondary | ICD-10-CM

## 2016-04-19 DIAGNOSIS — E785 Hyperlipidemia, unspecified: Secondary | ICD-10-CM

## 2016-04-19 NOTE — Patient Instructions (Addendum)
Medication Instructions:  Your physician recommends that you continue on your current medications as directed. Please refer to the Current Medication list given to you today.   Labwork: None ordered  Testing/Procedures: None ordered  Follow-Up: Your physician wants you to follow-up in: 6 months Dr.Harding You will receive a reminder letter in the mail two months in advance. If you don't receive a letter, please call our office to schedule the follow-up appointment.    Any Other Special Instructions Will Be Listed Below (If Applicable).     If you need a refill on your cardiac medications before your next appointment, please call your pharmacy.

## 2016-04-19 NOTE — Assessment & Plan Note (Signed)
Left foot. Extensive changes of Charcot joint again noted at the midfoot, with marked irregularity and partial collapse of the cuneiforms and cuboid, partial collapse of the bases of the metatarsals, multiple erosions at the anterior talus and calcaneus, and medial dislocation of part of the navicular. Marked surrounding soft inflammation, with edema tracking about the foot and ankle.

## 2016-04-19 NOTE — Assessment & Plan Note (Signed)
Ost LAD to Mid LAD lesion, 90% stenosed. Post intervention - Vision BMS 3.0 mm x 18 mm (~3.5 mm) there is a 0% residual stenosis.

## 2016-04-19 NOTE — Assessment & Plan Note (Signed)
Unable to afford statin Rx

## 2016-04-19 NOTE — Progress Notes (Signed)
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 She ruled out for an MI. Cath was done 03/31/16 which revealed mid LAD occlusion. This was treated with a BMS. Echo showed normal LVF. She was changed from Brilinta to Plavix after she had post PCI vaginal bleeding requiring transfusion before discharge. She is in the office today for follow up.  The pt has been seen at the Internal Medicine clinic and by social services. She is now living in a women's shelter in [place]. She says most of her things are in storage and they are threatening to auction her things off because she can't pay the storage fee. She also told me her truck broke down. She can't afford to fill her PRx for Glucophage or Pravachol, she is taking her ASA, Plavix, and Insulin. She is despondent about her situation.  Fortunately she has not had recurrent chest pain.    Current Outpatient Prescriptions  Medication Sig Dispense Refill  . albuterol (PROVENTIL HFA;VENTOLIN HFA) 108 (90 Base) MCG/ACT inhaler Inhale 2 puffs into the lungs every 4 (four) hours as needed for wheezing or shortness of breath (or coughing). 1 Inhaler 2  . aspirin 81 MG chewable tablet Chew 1 tablet (81 mg total) by mouth daily. 30 tablet 3  . atorvastatin (LIPITOR) 40 MG tablet Take 1 tablet (40 mg total) by mouth daily at 6 PM. 30 tablet 3  . clopidogrel (PLAVIX) 75 MG tablet Take 1 tablet (75 mg total) by mouth daily. 30 tablet 11  . ferrous sulfate 325 (65 FE) MG tablet Take 325 mg by mouth daily with breakfast.    . insulin aspart protamine- aspart (NOVOLOG MIX 70/30) (70-30) 100 UNIT/ML injection Inject 0.18 mLs (18 Units total) into the skin 2 (two)  times daily with a meal. (Patient taking differently: Inject 15 Units into the skin 2 (two) times daily with a meal. ) 10 mL 11  . megestrol (MEGACE) 40 MG tablet Take 40 mg by mouth 2 (two) times daily.    . Megestrol Acetate (MEGACE ES PO) Take 1 tablet by mouth 2 (two) times daily. Patient states she takes an unknown dose of this medication twice daily last dose 03/12/2016    . metFORMIN (GLUCOPHAGE) 500 MG tablet Take 1 tablet (500 mg total) by mouth 2 (two) times daily with a meal. 30 tablet 3  . metoprolol succinate (TOPROL XL) 50 MG 24 hr tablet Take 1 tablet (50 mg total) by mouth daily. Take with or immediately following a meal. 30 tablet 3  . pravastatin (PRAVACHOL) 40 MG tablet Take 1 tablet (40 mg total) by mouth every evening. 90 tablet 3   No current facility-administered medications for this visit.    No Known Allergies  Social History   Social History  . Marital Status: Single    Spouse Name: N/A  . Number of Children: 2  . Years of Education: 13   Occupational History  .   [employer]    Social History Main Topics  . Smoking status: Former Smoker -- 0.25 packs/day for 1 years    Types: Cigarettes    Quit date: 08/29/2015  . Smokeless tobacco: Never Used  . Alcohol Use: No  Comment: 12/23/2015 "last alcohol was ~ Christmas 2016; that was very little alcohol"  . Drug Use: No     Comment: 12/23/2015 "used to smoke marijuana; quit 04/2013"  . Sexual Activity: Yes    Birth Control/ Protection: Surgical   Other Topics Concern  . Not on file   Social History Narrative   Patient does not drink caffeine.   Patient is right handed.      Review of Systems: General: negative for chills, fever, night sweats or weight changes.  Cardiovascular: negative for chest pain, dyspnea on exertion, edema, orthopnea, palpitations, paroxysmal nocturnal dyspnea or shortness of breath Dermatological: negative for rash Respiratory: negative for cough or wheezing Urologic: negative  for hematuria Abdominal: negative for nausea, vomiting, diarrhea, bright red blood per rectum, melena, or hematemesis Neurologic: negative for visual changes, syncope, or dizziness All other systems reviewed and are otherwise negative except as noted above.    Blood pressure 115/72, pulse 90, height 5\' 5"  (1.651 m), weight 202 lb (91.627 kg), last menstrual period 03/25/2016, SpO2 100 %.  General appearance: alert, cooperative and no distress Neck: no carotid bruit and no JVD Lungs: clear to auscultation bilaterally Heart: regular rate and rhythm Extremities: Lt foot in cast   ASSESSMENT AND PLAN:   CAD S/P BMS PCI to prox LAD Ost LAD to Mid LAD lesion, 90% stenosed. Post intervention - Vision BMS 3.0 mm x 18 mm (~3.5 mm) there is a 0% residual stenosis.  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 Extensive changes of Charcot joint again noted at the midfoot, with marked irregularity and partial collapse of the cuneiforms and cuboid, partial collapse of the bases of the metatarsals, multiple erosions at the anterior talus and calcaneus, and medial dislocation of part of the navicular. Marked surrounding soft inflammation, with edema tracking about the foot and ankle.  Hyperlipidemia Unable to afford statin Rx   PLAN  No charge for today's visit. F/U in 6 months, resume statin Rx when possible. I told her obtaining Medicaid will help her situation.  Kerin Ransom PA-C 04/19/2016 3:06 PM

## 2016-04-19 NOTE — Assessment & Plan Note (Signed)
Retinopathy and Charcot joint- uses a rolling knee crutch

## 2016-04-21 ENCOUNTER — Telehealth: Payer: Self-pay | Admitting: *Deleted

## 2016-04-21 DIAGNOSIS — N938 Other specified abnormal uterine and vaginal bleeding: Secondary | ICD-10-CM

## 2016-04-21 DIAGNOSIS — I2510 Atherosclerotic heart disease of native coronary artery without angina pectoris: Secondary | ICD-10-CM

## 2016-04-21 DIAGNOSIS — Z9861 Coronary angioplasty status: Secondary | ICD-10-CM

## 2016-04-21 MED ORDER — ATORVASTATIN CALCIUM 40 MG PO TABS
40.0000 mg | ORAL_TABLET | Freq: Every day | ORAL | Status: DC
Start: 2016-04-21 — End: 2016-04-21

## 2016-04-21 MED ORDER — METFORMIN HCL 500 MG PO TABS: 500.0000 mg | ORAL_TABLET | Freq: Two times a day (BID) | ORAL | Status: DC

## 2016-04-21 MED ORDER — CLOPIDOGREL BISULFATE 75 MG PO TABS: 75.0000 mg | ORAL_TABLET | Freq: Every day | ORAL | Status: DC

## 2016-04-21 MED ORDER — METOPROLOL SUCCINATE ER 50 MG PO TB24: 50.0000 mg | ORAL_TABLET | Freq: Every day | ORAL | Status: DC

## 2016-04-21 NOTE — Telephone Encounter (Signed)
Thank you Edwena Blow for your help!  Dr. Naaman Plummer

## 2016-04-21 NOTE — Telephone Encounter (Signed)
CSW to contact pt today.  Last month, this worker referred pt to [place] to apply for [facility] Medicaid and provided with information on MAP.

## 2016-04-21 NOTE — Telephone Encounter (Signed)
Pt calls and states she has no income, no insurance and cannot pay for any of her meds, she is on plavix She is transferred to [facility]. And this note will be sent to dr kim and shanaG csw

## 2016-04-22 MED ORDER — MEGESTROL ACETATE 40 MG PO TABS: 40.0000 mg | ORAL_TABLET | Freq: Two times a day (BID) | ORAL | Status: DC

## 2016-04-22 MED FILL — METOPROLOL SUCC ER 50 MG TA: 50 | 30 days supply | Qty: 30 | Fill #0

## 2016-04-22 MED FILL — MEGESTROL 40 MG TABLET: 40 | 30 days supply | Qty: 60 | Fill #0

## 2016-04-22 MED FILL — ATORVASTATIN 40 MG TABLET: 40 | 30 days supply | Qty: 30 | Fill #0

## 2016-04-22 MED FILL — metFORMIN HCL 500 MG TABS: 500 | 30 days supply | Qty: 60 | Fill #0

## 2016-04-22 MED FILL — CLOPIDOGREL 75 MG TABLET: 75 | 30 days supply | Qty: 30 | Fill #0

## 2016-04-22 NOTE — Addendum Note (Signed)
Addended by: Forde Dandy on: 04/22/2016 11:50 AM   Modules accepted: Orders

## 2016-04-29 ENCOUNTER — Telehealth: Payer: Self-pay | Admitting: Licensed Clinical Social Worker

## 2016-04-29 NOTE — Telephone Encounter (Signed)
CSW placed called to pt.  CSW left message requesting return call. CSW provided contact hours and phone number. 

## 2016-05-04 ENCOUNTER — Encounter: Payer: Self-pay | Admitting: *Deleted

## 2016-05-05 ENCOUNTER — Ambulatory Visit: Payer: Self-pay | Admitting: Pharmacist

## 2016-05-05 ENCOUNTER — Ambulatory Visit: Payer: Self-pay

## 2016-05-05 DIAGNOSIS — Z79899 Other long term (current) drug therapy: Secondary | ICD-10-CM

## 2016-05-05 DIAGNOSIS — E113493 Type 2 diabetes mellitus with severe nonproliferative diabetic retinopathy without macular edema, bilateral: Secondary | ICD-10-CM

## 2016-05-05 DIAGNOSIS — D509 Iron deficiency anemia, unspecified: Secondary | ICD-10-CM

## 2016-05-05 DIAGNOSIS — Z9861 Coronary angioplasty status: Secondary | ICD-10-CM

## 2016-05-05 DIAGNOSIS — I2510 Atherosclerotic heart disease of native coronary artery without angina pectoris: Secondary | ICD-10-CM

## 2016-05-05 DIAGNOSIS — Z794 Long term (current) use of insulin: Secondary | ICD-10-CM

## 2016-05-05 DIAGNOSIS — J45909 Unspecified asthma, uncomplicated: Secondary | ICD-10-CM

## 2016-05-05 NOTE — Progress Notes (Signed)
Patient ID: Jane [name], female   DOB: Dec 23, 1975, 40 y.o.   MRN: [id number] Patient presented to the clinic to complete paperwork for [facility]. Patient also spoke with [name] regarding her blood glucose meter and test strips. The patient reports taking all medications and is not in need of a refill yet.

## 2016-05-05 NOTE — Progress Notes (Signed)
Patient was seen in clinic by Juanell Fairly, PharmD candidate. I agree with the assessment and plan of care documented.

## 2016-05-11 DIAGNOSIS — D509 Iron deficiency anemia, unspecified: Secondary | ICD-10-CM

## 2016-05-11 DIAGNOSIS — D500 Iron deficiency anemia secondary to blood loss (chronic): Secondary | ICD-10-CM

## 2016-05-11 DIAGNOSIS — J4520 Mild intermittent asthma, uncomplicated: Secondary | ICD-10-CM

## 2016-05-11 DIAGNOSIS — E0800 Diabetes mellitus due to underlying condition with hyperosmolarity without nonketotic hyperglycemic-hyperosmolar coma (NKHHC): Secondary | ICD-10-CM

## 2016-05-12 NOTE — Congregational Nurse Program (Signed)
Congregational Nurse Program Note  Date of Encounter: 05/11/2016  Past Medical History: Past Medical History  Diagnosis Date  . Asthma   . Hyperlipidemia   . Seasonal allergies   . Noncompliance with medications   . Peripheral neuropathy ([place])   . Diabetic retinopathy ([place])   . Iron deficiency anemia   . Menorrhagia   . Hypertension   . Pneumonia "several times"  . Type II diabetes mellitus (HCC)     uncontrolled  . Sickle cell trait ([place])   . Depression   . Heart murmur   . Retinopathy of both eyes   . CAD S/P BMS PCI to prox LAD 03/31/2016    Ost LAD to Mid LAD lesion, 90% stenosed. Post intervention - Vision BMS 3.0 mm x 18 mm (~3.5 mm) there is a 0% residual stenosis.     Encounter Details:     CNP Questionnaire - 05/12/16 0204    Patient Demographics   Is this a new or existing patient? New   Patient is considered a/an Not Applicable   Race African-American/Black   Patient Assistance   Location of Patient Assistance Not Applicable   Patient's financial/insurance status Self-Pay   Uninsured Patient Yes   Interventions Follow-up/Education/Support provided after completed appt.   Patient referred to apply for the following financial assistance Not Applicable   Food insecurities addressed Not Applicable   Transportation assistance No   Assistance securing medications No   Educational health offerings Chronic disease;Diabetes   Encounter Details   Primary purpose of visit Spiritual Care/Support Visit   Was an Emergency Department visit averted? Not Applicable   Does patient have a medical provider? Yes   Patient referred to Not Applicable   Was a mental health screening completed? (GAINS tool) No   Does patient have dental issues? No   Does patient have vision issues? No   Does your patient have an abnormal blood pressure today? No   Since previous encounter, have you referred patient for abnormal blood pressure that resulted in a new diagnosis or medication  change? No   Does your patient have an abnormal blood glucose today? No   Since previous encounter, have you referred patient for abnormal blood glucose that resulted in a new diagnosis or medication change? No   Was there a life-saving intervention made? No    Brief  Encounter with  Client as nurse was completing follow  Up  On another client . Introduced  Sports coach and client had several questions for the nurse .  Ask what was wrong with her foot as client had a  cast on and a riding walker to keep all pressure off her foot. Client tells nurse she has Charcot  Foot  And explained to the nurse what the condition meant . Nurse will gather more information on this condition and client smiled. Client was told when the nurse was available and ask her to come back to see the nurse , she agreed . Sates she has several conditions along with her foot problem, Hih blood  Pressure, diabetes , asthma , high cholesterol.  Case manager Liz Malady.  Will follow up  With client to  Obtain more information .Pulled  Information on foot condition from Med[employer].

## 2016-05-18 DIAGNOSIS — M7989 Other specified soft tissue disorders: Secondary | ICD-10-CM

## 2016-05-18 DIAGNOSIS — E0800 Diabetes mellitus due to underlying condition with hyperosmolarity without nonketotic hyperglycemic-hyperosmolar coma (NKHHC): Secondary | ICD-10-CM

## 2016-05-18 DIAGNOSIS — D500 Iron deficiency anemia secondary to blood loss (chronic): Secondary | ICD-10-CM

## 2016-05-18 NOTE — Congregational Nurse Program (Signed)
Congregational Nurse Program Note  Date of Encounter: 05/18/2016  Past Medical History: Past Medical History  Diagnosis Date  . Asthma   . Hyperlipidemia   . Seasonal allergies   . Noncompliance with medications   . Peripheral neuropathy ([place])   . Diabetic retinopathy ([place])   . Iron deficiency anemia   . Menorrhagia   . Hypertension   . Pneumonia "several times"  . Type II diabetes mellitus (HCC)     uncontrolled  . Sickle cell trait ([place])   . Depression   . Heart murmur   . Retinopathy of both eyes   . CAD S/P BMS PCI to prox LAD 03/31/2016    Ost LAD to Mid LAD lesion, 90% stenosed. Post intervention - Vision BMS 3.0 mm x 18 mm (~3.5 mm) there is a 0% residual stenosis.     Encounter Details:     CNP Questionnaire - 05/18/16 1915    Patient Demographics   Is this a new or existing patient? Existing   Patient is considered a/an Not Applicable   Race African-American/Black   Patient Assistance   Location of Patient Assistance Not Applicable   Patient's financial/insurance status Self-Pay   Uninsured Patient Yes   Interventions Follow-up/Education/Support provided after completed appt.   Patient referred to apply for the following financial assistance Medicaid   Food insecurities addressed Not Applicable   Transportation assistance No   Assistance securing medications No   Educational health offerings Chronic disease;Nutrition;Medications   Encounter Details   Primary purpose of visit Education/Health Concerns;Spiritual Care/Support Visit   Was an Emergency Department visit averted? Not Applicable   Does patient have a medical provider? Yes   Patient referred to Follow up with established PCP   Was a mental health screening completed? (GAINS tool) No   Does patient have dental issues? No   Does patient have vision issues? No   Does your patient have an abnormal blood pressure today? No   Since previous encounter, have you referred patient for abnormal blood  pressure that resulted in a new diagnosis or medication change? No   Does your patient have an abnormal blood glucose today? Yes   Since previous encounter, have you referred patient for abnormal blood glucose that resulted in a new diagnosis or medication change? No   Was there a life-saving intervention made? No    Client in to  Talk  With  Nurse  Today about her medical history. She  States she  Was working until  January ,2017  And started having  Problems with her leg swelling ,left  Leg she  Was out of  Work  For some months  And  Returned to  Work  Until  She  Couldn't work any more. She was wearing a size  13 mens  Shoe  And was finally referred to  A orthopedist  . She was hospitalized  For uterine   Bleeding  And  Then found  Out  She had some heart blockages  90 % and a stent was put in. . Diagnosed with  Diabetes  And  Takes  Metformin  500 mg  Twice a day  And  Novo log  Insulin  36 units  Per  Day  , 18  Am , 18 pm , sometimes  Regulates  That  According  To  Her levels. Nurse counseled  On proper taking of  Medication as it is  Prescribed. She is to  Contact her primary  Care  for a follow  Up  Appointment . Reports her  A1-C  Has  Been as high  As 14 , now  Down to 9.2 . Her cast came off her foot last week and now she is to keep it  Elevated as much as possible ,blood  Sugar averages  Around  219 mg  And she took it  Before dinner and it  Was 181 mg.  Nurse  Again counseled  Regarding her blood  Sugar  Range.   We  Discussed her nutritional habits  And she  States she  Loves  Doughnuts  And , needs to  Decrease her carbohydrates . Drinks  More  Water , lemonade . Given food  Guide  ,eat more ,eat less, ,how to  Have a healthy  Heart ,  ,diabetes booklet , A!-C readings .  Will monitor weekly  And  Client will  Work  To  Reduce carb's and  Eat more vegetables

## 2016-05-19 MED ORDER — METFORMIN HCL 500 MG PO TABS: 500.0000 mg | ORAL_TABLET | Freq: Two times a day (BID) | ORAL | Status: DC

## 2016-05-19 MED ORDER — ATORVASTATIN CALCIUM 40 MG PO TABS: 40.0000 mg | ORAL_TABLET | Freq: Every day | ORAL | Status: DC

## 2016-05-19 MED ORDER — INSULIN NPH ISOPHANE & REGULAR (70-30) 100 UNIT/ML ~~LOC~~ SUSP
18.0000 [IU] | Freq: Two times a day (BID) | SUBCUTANEOUS | Status: DC
Start: 2016-05-19 — End: 2016-06-13

## 2016-05-19 MED ORDER — ASPIRIN 81 MG PO CHEW: 81.0000 mg | CHEWABLE_TABLET | Freq: Every day | ORAL | Status: DC

## 2016-05-19 MED ORDER — METOPROLOL TARTRATE 50 MG PO TABS: 50.0000 mg | ORAL_TABLET | Freq: Two times a day (BID) | ORAL | Status: DC

## 2016-05-19 MED ORDER — FERROUS SULFATE 325 (65 FE) MG PO TABS: 325.0000 mg | ORAL_TABLET | Freq: Every day | ORAL | Status: DC

## 2016-05-19 MED ORDER — ALBUTEROL SULFATE HFA 108 (90 BASE) MCG/ACT IN AERS: 2.0000 | INHALATION_SPRAY | RESPIRATORY_TRACT | Status: DC | PRN

## 2016-05-19 NOTE — Addendum Note (Signed)
Addended by: Forde Dandy on: 05/19/2016 11:25 AM   Modules accepted: Orders, Medications

## 2016-05-23 ENCOUNTER — Ambulatory Visit: Payer: Self-pay | Admitting: Pharmacist

## 2016-05-23 ENCOUNTER — Ambulatory Visit (INDEPENDENT_AMBULATORY_CARE_PROVIDER_SITE_OTHER): Payer: Self-pay | Admitting: Internal Medicine

## 2016-05-23 ENCOUNTER — Encounter: Payer: Self-pay | Admitting: Internal Medicine

## 2016-05-23 VITALS — BP 143/73 | HR 88 | Temp 99.0°F | Ht 65.0 in | Wt 220.2 lb

## 2016-05-23 DIAGNOSIS — E113493 Type 2 diabetes mellitus with severe nonproliferative diabetic retinopathy without macular edema, bilateral: Secondary | ICD-10-CM

## 2016-05-23 DIAGNOSIS — E11610 Type 2 diabetes mellitus with diabetic neuropathic arthropathy: Secondary | ICD-10-CM

## 2016-05-23 DIAGNOSIS — I2510 Atherosclerotic heart disease of native coronary artery without angina pectoris: Secondary | ICD-10-CM

## 2016-05-23 DIAGNOSIS — Z955 Presence of coronary angioplasty implant and graft: Secondary | ICD-10-CM

## 2016-05-23 DIAGNOSIS — E785 Hyperlipidemia, unspecified: Secondary | ICD-10-CM

## 2016-05-23 DIAGNOSIS — Z9861 Coronary angioplasty status: Principal | ICD-10-CM

## 2016-05-23 DIAGNOSIS — N938 Other specified abnormal uterine and vaginal bleeding: Secondary | ICD-10-CM

## 2016-05-23 DIAGNOSIS — Z79899 Other long term (current) drug therapy: Secondary | ICD-10-CM

## 2016-05-23 DIAGNOSIS — Z794 Long term (current) use of insulin: Secondary | ICD-10-CM

## 2016-05-23 DIAGNOSIS — Z7982 Long term (current) use of aspirin: Secondary | ICD-10-CM

## 2016-05-23 DIAGNOSIS — E083413 Diabetes mellitus due to underlying condition with severe nonproliferative diabetic retinopathy with macular edema, bilateral: Secondary | ICD-10-CM

## 2016-05-23 MED ORDER — CLOPIDOGREL BISULFATE 75 MG PO TABS: 75.0000 mg | ORAL_TABLET | Freq: Every day | ORAL | Status: DC

## 2016-05-23 MED ORDER — MEGESTROL ACETATE 40 MG PO TABS: 40.0000 mg | ORAL_TABLET | Freq: Two times a day (BID) | ORAL | Status: DC

## 2016-05-23 MED FILL — CLOPIDOGREL 75 MG TABLET: 75 | 30 days supply | Qty: 30 | Fill #0

## 2016-05-23 MED FILL — MEGESTROL 40 MG TABLET: 40 | 30 days supply | Qty: 60 | Fill #0

## 2016-05-23 NOTE — Progress Notes (Signed)
Patient ID: Jane [name], female   DOB: Feb 19, 1976, 40 y.o.   MRN: [id number]   CC: here for follow up of charcot foot   HPI:  [name] is a 40 y.o. female with past medical hx as below here for follow up of her charcot foot.  Please see A&P for further details of today's visit.  Past Medical History  Diagnosis Date  . Asthma   . Hyperlipidemia   . Seasonal allergies   . Noncompliance with medications   . Peripheral neuropathy ([place])   . Diabetic retinopathy ([place])   . Iron deficiency anemia   . Menorrhagia   . Hypertension   . Pneumonia "several times"  . Type II diabetes mellitus (HCC)     uncontrolled  . Sickle cell trait ([place])   . Depression   . Heart murmur   . Retinopathy of both eyes   . CAD S/P BMS PCI to prox LAD 03/31/2016    Ost LAD to Mid LAD lesion, 90% stenosed. Post intervention - Vision BMS 3.0 mm x 18 mm (~3.5 mm) there is a 0% residual stenosis.     Review of Systems:   Review of Systems  Constitutional: Negative for fever and chills.  Respiratory: Negative for shortness of breath.   Cardiovascular: Negative for chest pain.  Musculoskeletal:       Foot pain and swelling     Physical Exam:  Filed Vitals:   05/23/16 1056  BP: 143/73  Pulse: 88  Temp: 99 F (37.2 C)  TempSrc: Oral  Height: 5\' 5"  (1.651 m)  Weight: 220 lb 3.2 oz (99.882 kg)  SpO2: 100%   Physical Exam  Constitutional: She is oriented to person, place, and time and well-developed, well-nourished, and in no distress.  Mobile via rolling scooter  HENT:  Head: Normocephalic and atraumatic.  Musculoskeletal:  Left foot with Charcot deformity  Neurological: She is alert and oriented to person, place, and time.     Assessment & Plan:   See encounters tab for problem based medical decision making.   Patient discussed with Dr. Daryll Drown   No problem-specific assessment & plan notes found for this encounter.

## 2016-05-23 NOTE — Addendum Note (Signed)
Addended by: Forde Dandy on: 05/23/2016 11:18 AM   Modules accepted: Orders

## 2016-05-23 NOTE — Progress Notes (Signed)
Physician-pharmacy co-visit  Medication Samples have been provided to the patient.  Drug name: insulin aspart (Novolog) Mix 70/30       Strength: 100 units/mL        Qty: 1  LOT: [id number]  Exp.Date: 08/2017  Dosing instructions: 18 units BID  Drug name: metoprolol sucinate       Strength: 50 mg        Qty: 5  LOT: [id number]  Exp.Date: 09/12/13  Dosing instructions: 1 tablet daily  The patient has been instructed regarding the correct time, dose, and frequency of taking this medication, including desired effects and most common side effects.   Franceen Erisman J 11:01 AM 05/23/2016

## 2016-05-23 NOTE — Patient Instructions (Signed)
Thank you for coming to see me today. It was a pleasure. Today we talked about:   Foot swelling: Please ask your shelter to provide you with extra pillows to help with your leg swelling in order to prop your foot up.  Please follow up with orthopedic surgery and ask them about Unna boots.  Please follow-up with your PCP in 1 month.  If you have any questions or concerns, please do not hesitate to call the office at [phone].  Take Care,   Jule Ser, DO

## 2016-05-24 NOTE — Assessment & Plan Note (Signed)
A: She has been adherent to Novolog 70/30 18 units BID.  Blood sugar log ranges 150-200.  She is also taking metformin 500mg  BID.  I suggested the possibility of increasing her metformin.  She stated that in the past while having to take 1000mg  at a time, she had GI upset.  P: - continue current management - check A1c next month - follow up with PCP - consider V-Go if A1c remains above goal and consultation with Debera Lat or other oral agents

## 2016-05-24 NOTE — Progress Notes (Signed)
Internal Medicine Clinic Attending  Case discussed with Dr. Wallace at the time of the visit.  We reviewed the resident's history and exam and pertinent patient test results.  I agree with the assessment, diagnosis, and plan of care documented in the resident's note.  

## 2016-05-24 NOTE — Assessment & Plan Note (Signed)
A: Saw Dr. Doran Durand with [facility] and had cast removed on June 21.  Still has swelling that is no worse currently.  They recommended non-weight bearing.  She has follow up with ortho on July 19.   She is asking for extra pillows to keep her foot elevated as this helps the swelling at her shelter but needs [place] to put it in writing.  P:  - f/u with orthopedics - asked shelter to provide extra pillows - compression socks given today - Ace bandage also provided - suggested she inquire to orthopedics regarding Unna boots - glycemic control

## 2016-05-24 NOTE — Assessment & Plan Note (Signed)
A: There was some issue whether she would be able to afford Plavix after having BMS placed in May.  P: - was able to work with our pharmacist, Mannie Stabile, in order to get plavix - she is also back taking her atorvastatin - continue plavix and aspirin - f/u with cardiology as scheduled

## 2016-05-24 NOTE — Assessment & Plan Note (Signed)
A/P: Continue DAP with plavix and aspirin.

## 2016-05-24 NOTE — Assessment & Plan Note (Signed)
A: Previously was off statin due to cost but found extra bottles of her atorvastatin in storage so she has been taking this again.  P: - continue atorvastatin 40mg  - our pharmacist, Mannie Stabile, was able to work to get her access to this medication in the future at little to no cost.

## 2016-05-25 DIAGNOSIS — M7989 Other specified soft tissue disorders: Secondary | ICD-10-CM

## 2016-05-25 DIAGNOSIS — J4520 Mild intermittent asthma, uncomplicated: Secondary | ICD-10-CM

## 2016-05-25 DIAGNOSIS — E0800 Diabetes mellitus due to underlying condition with hyperosmolarity without nonketotic hyperglycemic-hyperosmolar coma (NKHHC): Secondary | ICD-10-CM

## 2016-05-25 NOTE — Congregational Nurse Program (Signed)
Congregational Nurse Program Note  Date of Encounter: 05/25/2016  Past Medical History: Past Medical History  Diagnosis Date  . Asthma   . Hyperlipidemia   . Seasonal allergies   . Noncompliance with medications   . Peripheral neuropathy ([place])   . Diabetic retinopathy ([place])   . Iron deficiency anemia   . Menorrhagia   . Hypertension   . Pneumonia "several times"  . Type II diabetes mellitus (HCC)     uncontrolled  . Sickle cell trait ([place])   . Depression   . Heart murmur   . Retinopathy of both eyes   . CAD S/P BMS PCI to prox LAD 03/31/2016    Ost LAD to Mid LAD lesion, 90% stenosed. Post intervention - Vision BMS 3.0 mm x 18 mm (~3.5 mm) there is a 0% residual stenosis.     Encounter Details:     CNP Questionnaire - 05/25/16 2342    Patient Demographics   Is this a new or existing patient? Existing   Patient is considered a/an Not Applicable   Race African-American/Black   Patient Assistance   Location of Patient Assistance Not Applicable   Patient's financial/insurance status Self-Pay   Uninsured Patient Yes   Interventions Counseled to make appt. with provider   Patient referred to apply for the following financial assistance Not Applicable   Food insecurities addressed Not Applicable   Transportation assistance No   Assistance securing medications No   Educational health offerings Chronic disease;Diabetes;Nutrition;Medications;Hypertension   Encounter Details   Primary purpose of visit Chronic Illness/Condition Visit;Education/Health Concerns;Spiritual Care/Support Visit   Was an Emergency Department visit averted? Not Applicable   Does patient have a medical provider? Yes   Patient referred to Follow up with established PCP   Was a mental health screening completed? (GAINS tool) No   Does patient have dental issues? No   Does patient have vision issues? No   Does your patient have an abnormal blood pressure today? Yes   Since previous encounter, have you  referred patient for abnormal blood pressure that resulted in a new diagnosis or medication change? No   Does your patient have an abnormal blood glucose today? Yes   Since previous encounter, have you referred patient for abnormal blood glucose that resulted in a new diagnosis or medication change? No   Was there a life-saving intervention made? No         Amb Nursing Assessment - 05/23/16 1058    Pre-visit preparation   Pre-visit preparation completed Yes   Pain Assessment   Pain Assessment 0-10   Pain Score 5    Pain Type Acute pain   Pain Location Leg   Pain Orientation Left   Pain Radiating Towards goes to foot    Pain Descriptors / Indicators Aching   Pain Onset More than a month ago   Pain Frequency Intermittent   Pain Relieving Factors not taking anything   Effect of Pain on Daily Activities swelling .  Hurts to walk   Nutrition Screen   BMI - recorded 36.64   Nutritional Status BMI > 30  Obese   Nutritional Risks None   Diabetes Yes   CBG done? Yes   CBG resulted in Enter/ Edit results? Yes   Did pt. bring in CBG monitor from home? Yes   Glucose Meter Downloaded? Yes   Functional Status   Activities of Daily Living Independent   Ambulation Independent with device- listed below   Home Assistive Devices/Equipment Other (Comment)  scooter for leg   Medication Administration Independent   Home Management Independent   Risk/Barriers  Assessment   Barriers to Care Management & Learning None   Abuse/Neglect Assessment   Do you feel unsafe in your current relationship? No   Do you feel physically threatened by others? No   Anyone hurting you at home, work, or school? No   Unable to ask? No   Information provided on Community resources No   Patient Literacy   How often do you need to have someone help you when you read instructions, pamphlets, or other written materials from your doctor or pharmacy? 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 No   Comments   Information entered by : Sander Nephew, RN 05/23/2016 11:01 AM      Client in today  Has lots  Of questions regarding her nutrition as it  Relates to her diabetes.Client did a 24 hour  Recall of her meals : breakfast  3 small pieces of  Chicken, 1/2 orange  Juice , 1/2 doughnut ,, no lunch , dinner chicken took most of the skin off, potato wedges, string beans, corn, cake and lemonade. Nurse counseled  Regarding carbohydrate consumption, the need for more vegetables and fruit and not skipping lunch. She understands fairly well serving size and where she needs to cut back and is trying. Nurse commended on her desire to cut back on carbohydrate  consumption and gave her a booklet on Managing her diabetes with menu sample and good foods not  to  Chose. Counseled regarding her up and lows in blood sugars following her eating habits. Encouraged her to try to eat some lunch. Blood sugar readings today by her report were:  05-24-16 pm was 173 mg at bedtime , 05-25-16 am 103 mg,   Medication taken, noon 71 mg felt bad too low  Eat  Chips, and her snack bag , before  Dinner 154 mg  .  Nurse reviewed  Know your  Numbers  And A1-C  Results and gave her signs and symptoms  Of low blood sugar .Client states her goal is to get her A1_C down to 7%.  Clients leg has shown great improvement ,swelling has decreased significantly ,using a compression bandage . To check on upcoming  PCP appointments .  Follow  weekly

## 2016-05-30 DIAGNOSIS — Z0279 Encounter for issue of other medical certificate: Secondary | ICD-10-CM | POA: Diagnosis not present

## 2016-05-31 DIAGNOSIS — E0800 Diabetes mellitus due to underlying condition with hyperosmolarity without nonketotic hyperglycemic-hyperosmolar coma (NKHHC): Secondary | ICD-10-CM

## 2016-05-31 DIAGNOSIS — M7989 Other specified soft tissue disorders: Secondary | ICD-10-CM

## 2016-05-31 DIAGNOSIS — G5792 Unspecified mononeuropathy of left lower limb: Secondary | ICD-10-CM

## 2016-05-31 NOTE — Congregational Nurse Program (Signed)
Congregational Nurse Program Note  Date of Encounter: 05/31/2016  Past Medical History: Past Medical History  Diagnosis Date  . Asthma   . Hyperlipidemia   . Seasonal allergies   . Noncompliance with medications   . Peripheral neuropathy ([place])   . Diabetic retinopathy ([place])   . Iron deficiency anemia   . Menorrhagia   . Hypertension   . Pneumonia "several times"  . Type II diabetes mellitus (HCC)     uncontrolled  . Sickle cell trait ([place])   . Depression   . Heart murmur   . Retinopathy of both eyes   . CAD S/P BMS PCI to prox LAD 03/31/2016    Ost LAD to Mid LAD lesion, 90% stenosed. Post intervention - Vision BMS 3.0 mm x 18 mm (~3.5 mm) there is a 0% residual stenosis.     Encounter Details:     CNP Questionnaire - 05/31/16 1711    Patient Demographics   Is this a new or existing patient? Existing   Patient is considered a/an Not Applicable   Race African-American/Black   Patient Assistance   Location of Patient Assistance Not Applicable   Patient's financial/insurance status Self-Pay   Uninsured Patient Yes   Interventions Follow-up/Education/Support provided after completed appt.;Counseled to make appt. with provider   Patient referred to apply for the following financial assistance Medicaid;[place] [facility]   Food insecurities addressed Not Applicable   Transportation assistance No   Assistance securing medications No   Educational health offerings Chronic disease;Diabetes;Safety;Nutrition;Medications   Encounter Details   Primary purpose of visit Chronic Illness/Condition Visit;Education/Health Concerns;Spiritual Care/Support Visit   Was an Emergency Department visit averted? Not Applicable   Does patient have a medical provider? Yes   Patient referred to [facility];Follow up with established PCP   Was a mental health screening completed? (GAINS tool) No   Does patient have dental issues? No   Does patient have vision issues? Yes   Was a vision  referral made? No   Does your patient have an abnormal blood pressure today? No   Since previous encounter, have you referred patient for abnormal blood pressure that resulted in a new diagnosis or medication change? No   Does your patient have an abnormal blood glucose today? Yes   Since previous encounter, have you referred patient for abnormal blood glucose that resulted in a new diagnosis or medication change? No   Was there a life-saving intervention made? No         Amb Nursing Assessment - 05/23/16 1058    Pre-visit preparation   Pre-visit preparation completed Yes   Pain Assessment   Pain Assessment 0-10   Pain Score 5    Pain Type Acute pain   Pain Location Leg   Pain Orientation Left   Pain Radiating Towards goes to foot    Pain Descriptors / Indicators Aching   Pain Onset More than a month ago   Pain Frequency Intermittent   Pain Relieving Factors not taking anything   Effect of Pain on Daily Activities swelling .  Hurts to walk   Nutrition Screen   BMI - recorded 36.64   Nutritional Status BMI > 30  Obese   Nutritional Risks None   Diabetes Yes   CBG done? Yes   CBG resulted in Enter/ Edit results? Yes   Did pt. bring in CBG monitor from home? Yes   Glucose Meter Downloaded? Yes   Functional Status   Activities of Daily Living Independent  Ambulation Independent with device- listed below   Home Assistive Devices/Equipment Other (Comment)  scooter for leg   Medication Administration Independent   Home Management Independent   Risk/Barriers  Assessment   Barriers to Care Management & Learning None   Abuse/Neglect Assessment   Do you feel unsafe in your current relationship? No   Do you feel physically threatened by others? No   Anyone hurting you at home, work, or school? No   Unable to ask? No   Information provided on Community resources No   Patient Literacy   How often do you need to have someone help you when you read instructions, pamphlets, or other  written materials from your doctor or pharmacy? 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 No   Comments   Information entered by : Sander Nephew, RN 05/23/2016 11:01 AM     Client in today stating she  Had made some progress on her lifestyle  Nutritional habits  But  Also  Cheated some for lunch. States blood  Sugar this  Am was around 100 mg, pm blood  Sugars  Running  Higher,looked at the way she was taking her medications and knows she hasn't been taking as prescribed but now  Paying closer attention to  The times. Nurse counseled  Regarding insulin and  Medication coverage  Periods and will different medications peak. Client had more questions about her nutrition and her left leg. Leg is  Swollen today  When nurse looked at leg the compression ace bandage was to tight on foot causing swelling, counseled regarding circulation and the need to use her compression stockings for even compression. Counseled regarding blood  Flow  And flow of blood to her feet. Client to see orthopedic MD tomorrow  Reviewed questions  She  Should  Be asking  MD. How  Long does she wear the hose , should  She  Wears  both compression stocking and ace bandage ?? Encouraged client not  To  But will seek answers from MD on tomorrow. Worried they  May  Want to  Do  Surgery on foot  And place pins in it to give it support. Clients foot shows  Areas of concern as skin is getting darker , encouraged and  Supported to do  What is  Ask of her foot care to prevent amputation, client states I don't  Want to  talk about that but  She stressed why foot care is so important  And to keep pressure off foot as much as possible. Area that has healed from previous wound looks fine today but is in the crease of her foot where it joins the ankle  Very sensitive area to break down.  Client has gained a lot of  Knowledge about her condition and has progressed a lot in  managing her diabetes but needs to continue to work on better diabetes management / nutrition To follow  Up after MD visit to  Orthopedic  MD on tomorrow.

## 2016-06-01 DIAGNOSIS — M7989 Other specified soft tissue disorders: Secondary | ICD-10-CM

## 2016-06-01 DIAGNOSIS — G5792 Unspecified mononeuropathy of left lower limb: Secondary | ICD-10-CM

## 2016-06-01 DIAGNOSIS — E0800 Diabetes mellitus due to underlying condition with hyperosmolarity without nonketotic hyperglycemic-hyperosmolar coma (NKHHC): Secondary | ICD-10-CM

## 2016-06-01 NOTE — Congregational Nurse Program (Signed)
Congregational Nurse Program Note  Date of Encounter: 06/01/2016  Past Medical History: Past Medical History  Diagnosis Date  . Asthma   . Hyperlipidemia   . Seasonal allergies   . Noncompliance with medications   . Peripheral neuropathy ([place])   . Diabetic retinopathy ([place])   . Iron deficiency anemia   . Menorrhagia   . Hypertension   . Pneumonia "several times"  . Type II diabetes mellitus (HCC)     uncontrolled  . Sickle cell trait ([place])   . Depression   . Heart murmur   . Retinopathy of both eyes   . CAD S/P BMS PCI to prox LAD 03/31/2016    Ost LAD to Mid LAD lesion, 90% stenosed. Post intervention - Vision BMS 3.0 mm x 18 mm (~3.5 mm) there is a 0% residual stenosis.     Encounter Details:     CNP Questionnaire - 06/01/16 1821    Patient Demographics   Is this a new or existing patient? Existing   Patient is considered a/an Not Applicable   Race African-American/Black   Patient Assistance   Location of Patient Assistance Not Applicable   Patient's financial/insurance status Self-Pay   Uninsured Patient Yes   Interventions Appt. has been completed;Follow-up/Education/Support provided after completed appt.   Patient referred to apply for the following financial assistance Medicaid   Food insecurities addressed Not Applicable   Transportation assistance No   Assistance securing medications No   Educational health offerings Chronic disease;Diabetes;Nutrition;Safety;Exercise/physical activity   Encounter Details   Primary purpose of visit Education/Health Concerns;Spiritual Care/Support Visit;Chronic Illness/Condition Visit   Was an Emergency Department visit averted? Not Applicable   Does patient have a medical provider? Yes   Patient referred to [facility];Follow up with established PCP   Was a mental health screening completed? (GAINS tool) No   Does patient have dental issues? No   Does patient have vision issues? Yes   Was a vision referral made? No   Does  your patient have an abnormal blood pressure today? No   Since previous encounter, have you referred patient for abnormal blood pressure that resulted in a new diagnosis or medication change? No   Does your patient have an abnormal blood glucose today? Yes   Since previous encounter, have you referred patient for abnormal blood glucose that resulted in a new diagnosis or medication change? No   Was there a life-saving intervention made? No         Amb Nursing Assessment - 05/23/16 1058    Pre-visit preparation   Pre-visit preparation completed Yes   Pain Assessment   Pain Assessment 0-10   Pain Score 5    Pain Type Acute pain   Pain Location Leg   Pain Orientation Left   Pain Radiating Towards goes to foot    Pain Descriptors / Indicators Aching   Pain Onset More than a month ago   Pain Frequency Intermittent   Pain Relieving Factors not taking anything   Effect of Pain on Daily Activities swelling .  Hurts to walk   Nutrition Screen   BMI - recorded 36.64   Nutritional Status BMI > 30  Obese   Nutritional Risks None   Diabetes Yes   CBG done? Yes   CBG resulted in Enter/ Edit results? Yes   Did pt. bring in CBG monitor from home? Yes   Glucose Meter Downloaded? Yes   Functional Status   Activities of Daily Living Independent   Ambulation Independent  with device- listed below   Home Assistive Devices/Equipment Other (Comment)  scooter for leg   Medication Administration Independent   Home Management Independent   Risk/Barriers  Assessment   Barriers to Care Management & Learning None   Abuse/Neglect Assessment   Do you feel unsafe in your current relationship? No   Do you feel physically threatened by others? No   Anyone hurting you at home, work, or school? No   Unable to ask? No   Information provided on Community resources No   Patient Literacy   How often do you need to have someone help you when you read instructions, pamphlets, or other written materials from  your doctor or pharmacy? 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 No   Comments   Information entered by : Sander Nephew, RN 05/23/2016 11:01 AM    Clinet  Seen by  Barney Drain today  And is  To  Have no  Weight -bearing on left leg. Cliient understands importance. To  Use compression hose  And can apply ace bandage on top of  It . Talked about  Surgery on foot  But  A1-C has to come down to  7 at least . Right now  It is between 9 and 8. . Client is  Determined to work to lower her  A1-C understands importance.  Again nurse counseled regarding her  Disease . Has medicaid  Appeal  Hearing next week  9 27-17 .states  Blood  Sugar  In 100's  This  Am  ,evenings  190's .   Monitor  Weekly  .

## 2016-06-07 DIAGNOSIS — F329 Major depressive disorder, single episode, unspecified: Secondary | ICD-10-CM

## 2016-06-07 DIAGNOSIS — E0800 Diabetes mellitus due to underlying condition with hyperosmolarity without nonketotic hyperglycemic-hyperosmolar coma (NKHHC): Secondary | ICD-10-CM

## 2016-06-07 DIAGNOSIS — M7989 Other specified soft tissue disorders: Secondary | ICD-10-CM

## 2016-06-07 DIAGNOSIS — J4520 Mild intermittent asthma, uncomplicated: Secondary | ICD-10-CM

## 2016-06-07 DIAGNOSIS — G5792 Unspecified mononeuropathy of left lower limb: Secondary | ICD-10-CM

## 2016-06-07 DIAGNOSIS — F32A Depression, unspecified: Secondary | ICD-10-CM

## 2016-06-07 LAB — GLUCOSE, POCT (MANUAL RESULT ENTRY)

## 2016-06-07 NOTE — Congregational Nurse Program (Signed)
Congregational Nurse Program Note  Date of Encounter: 06/07/2016  Past Medical History: Past Medical History:  Diagnosis Date  . Asthma   . CAD S/P BMS PCI to prox LAD 03/31/2016   Ost LAD to Mid LAD lesion, 90% stenosed. Post intervention - Vision BMS 3.0 mm x 18 mm (~3.5 mm) there is a 0% residual stenosis.   . Depression   . Diabetic retinopathy ([place])   . Heart murmur   . Hyperlipidemia   . Hypertension   . Iron deficiency anemia   . Menorrhagia   . Noncompliance with medications   . Peripheral neuropathy ([place])   . Pneumonia "several times"  . Retinopathy of both eyes   . Seasonal allergies   . Sickle cell trait ([place])   . Type II diabetes mellitus ([place])    uncontrolled    Encounter Details:     CNP Questionnaire - 06/07/16 1759      Patient Demographics   Is this a new or existing patient? Existing   Patient is considered a/an Not Applicable   Race African-American/Black     Patient Assistance   Location of Patient Assistance Not Applicable   Patient's financial/insurance status Self-Pay   Interventions Follow-up/Education/Support provided after completed appt.   Patient referred to apply for the following financial assistance Medicaid   Food insecurities addressed Not Applicable   Transportation assistance No   Assistance securing medications No   Educational health offerings Chronic disease;Diabetes;Hypertension;Medications;Safety;Behavioral health     Encounter Details   Primary purpose of visit Chronic Illness/Condition Visit;Education/Health Concerns;Spiritual Care/Support Visit   Was an Emergency Department visit averted? Not Applicable   Does patient have a medical provider? Yes   Patient referred to Follow up with established PCP;[facility]   Was a mental health screening completed? (GAINS tool) No   Does patient have dental issues? No   Does patient have vision issues? Yes   Was a vision referral made? No   Does your patient have an abnormal blood  pressure today? No   Since previous encounter, have you referred patient for abnormal blood pressure that resulted in a new diagnosis or medication change? No   Does your patient have an abnormal blood glucose today? No   Since previous encounter, have you referred patient for abnormal blood glucose that resulted in a new diagnosis or medication change? No   Was there a life-saving intervention made? No         Amb Nursing Assessment - 05/23/16 1058      Pre-visit preparation   Pre-visit preparation completed Yes     Pain Assessment   Pain Assessment 0-10   Pain Score 5    Pain Type Acute pain   Pain Location Leg   Pain Orientation Left   Pain Radiating Towards goes to foot    Pain Descriptors / Indicators Aching   Pain Onset More than a month ago   Pain Frequency Intermittent   Pain Relieving Factors not taking anything   Effect of Pain on Daily Activities swelling .  Hurts to walk     Nutrition Screen   BMI - recorded 36.64   Nutritional Status BMI > 30  Obese   Nutritional Risks None   Diabetes Yes   CBG done? Yes   CBG resulted in Enter/ Edit results? Yes   Did pt. bring in CBG monitor from home? Yes   Glucose Meter Downloaded? Yes     Functional Status   Activities of Daily Living Independent  Ambulation Independent with device- listed below   Home Assistive Devices/Equipment Other (Comment)  scooter for leg   Medication Administration Independent   Home Management Independent     Risk/Barriers  Assessment   Barriers to Care Management & Learning None     Abuse/Neglect Assessment   Do you feel unsafe in your current relationship? No   Do you feel physically threatened by others? No   Anyone hurting you at home, work, or school? No   Unable to ask? No   Information provided on Community resources No     Patient Literacy   How often do you need to have someone help you when you read instructions, pamphlets, or other written materials from your doctor or  pharmacy? 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 No     Comments   Information entered by : Sander Nephew, RN 05/23/2016 11:01 AM     Client in today not  A good  Day . States she is  About to say  And  Do  Something because someone is  Getting next  To  Her. Nurse ask  What  Was going on states her roommate is  Giving her a hard time and that a meeting has been planned to  Discuss problems . States I don't  Want to  Go off. Nurse listened and counseled  Regarding anger management and letting things  Go  ,concentrate on her goals for herself. After a lengthy discussion client seemed more at  ease ,smiled some.She  Is going to  [facility] ,feels she  Needs that  In order to  Keep her  behavior in check. . Talked about her situation ,living in the shelter , applying  For  Disability ,hearing with  Medicaid  this  week.  Blood  Pressure  And  Blood sugar  Looking  Good  Today  But  Client states last night blood  Sugar was 250 mg ,this  Am in the 130's . Today  Blood  Pressure  Was 121/74 ,pulse 83  ,blood  Sugar  Before dinner @ 5;15  94 ,very low  For  Her ,cautioned .

## 2016-06-13 ENCOUNTER — Other Ambulatory Visit: Payer: Self-pay | Admitting: Oncology

## 2016-06-13 DIAGNOSIS — Z794 Long term (current) use of insulin: Principal | ICD-10-CM

## 2016-06-13 DIAGNOSIS — J45909 Unspecified asthma, uncomplicated: Secondary | ICD-10-CM

## 2016-06-13 DIAGNOSIS — E113493 Type 2 diabetes mellitus with severe nonproliferative diabetic retinopathy without macular edema, bilateral: Secondary | ICD-10-CM

## 2016-06-14 DIAGNOSIS — M7989 Other specified soft tissue disorders: Secondary | ICD-10-CM

## 2016-06-14 DIAGNOSIS — F329 Major depressive disorder, single episode, unspecified: Secondary | ICD-10-CM

## 2016-06-14 DIAGNOSIS — D509 Iron deficiency anemia, unspecified: Secondary | ICD-10-CM

## 2016-06-14 DIAGNOSIS — E0800 Diabetes mellitus due to underlying condition with hyperosmolarity without nonketotic hyperglycemic-hyperosmolar coma (NKHHC): Secondary | ICD-10-CM

## 2016-06-14 DIAGNOSIS — F32A Depression, unspecified: Secondary | ICD-10-CM

## 2016-06-14 DIAGNOSIS — R079 Chest pain, unspecified: Secondary | ICD-10-CM

## 2016-06-14 DIAGNOSIS — G5792 Unspecified mononeuropathy of left lower limb: Secondary | ICD-10-CM

## 2016-06-14 LAB — GLUCOSE, POCT (MANUAL RESULT ENTRY)

## 2016-06-14 NOTE — Congregational Nurse Program (Signed)
Congregational Nurse Program Note  Date of Encounter: 06/14/2016  Past Medical History: Past Medical History:  Diagnosis Date  . Asthma   . CAD S/P BMS PCI to prox LAD 03/31/2016   Ost LAD to Mid LAD lesion, 90% stenosed. Post intervention - Vision BMS 3.0 mm x 18 mm (~3.5 mm) there is a 0% residual stenosis.   . Depression   . Diabetic retinopathy ([place])   . Heart murmur   . Hyperlipidemia   . Hypertension   . Iron deficiency anemia   . Menorrhagia   . Noncompliance with medications   . Peripheral neuropathy ([place])   . Pneumonia "several times"  . Retinopathy of both eyes   . Seasonal allergies   . Sickle cell trait ([place])   . Type II diabetes mellitus ([place])    uncontrolled    Encounter Details:     CNP Questionnaire - 06/14/16 2335      Patient Demographics   Is this a new or existing patient? Existing   Patient is considered a/an Not Applicable   Race African-American/Black     Patient Assistance   Location of Patient Assistance Not Applicable   Patient's financial/insurance status Self-Pay;[facility] Card/[employer]   Uninsured Patient Yes   Interventions Follow-up/Education/Support provided after completed appt.   Patient referred to apply for the following financial assistance [place] insecurities addressed Not Applicable   Transportation assistance Yes   Type of Assistance Bus Pass Given   Assistance securing medications No   Sport and exercise psychologist health;Interpersonal relationships;Medications;Chronic disease;Diabetes;Nutrition;Hypertension     Encounter Details   Primary purpose of visit Chronic Illness/Condition Visit;Education/Health Concerns;Navigating the Healthcare System;Spiritual Care/Support Visit   Was an Emergency Department visit averted? Not Applicable   Does patient have a medical provider? Yes   Patient referred to [facility];Follow up with established PCP;Emergency Department   Was a mental health  screening completed? (GAINS tool) No   Does patient have dental issues? No   Does patient have vision issues? Yes   Was a vision referral made? No   Does your patient have an abnormal blood pressure today? Yes   Since previous encounter, have you referred patient for abnormal blood pressure that resulted in a new diagnosis or medication change? No   Does your patient have an abnormal blood glucose today? No   Since previous encounter, have you referred patient for abnormal blood glucose that resulted in a new diagnosis or medication change? No   Was there a life-saving intervention made? No      client in to see nurse stating she hasnt been sleeping well. States she was up several times last night.Counseled on ways to get to sleep. Client expressed that she was somewhat depressed, had her medicaid hearing last week , has releases to get her providers to complete ,will do that tomorrow , nurse gave client bus passes to get to provider offices. Started to complete [facility] transportation ,client needs to apply  For orange card. Client will go to [facility] on tomorrow to apply  For orange card.,upon  will send in transportation request. Client had a need to verbalize her feelings , has appointment at [facility] to see psychiatrist  And can walk in to  [facility] if she feels she needs to be seen before appointment inSepte mber. Client is seeing a therapist and has a appointment  this week.  Counseled regarding  low  Blood  Sugars , not  Eating regular meals , risk  of  Low  blood sugars. Client is fairly knowledgeable about .her diabetes,admits to not  Controlling her diabetes at first ,not taking medications and understands complications now. Counseled regarding  Nutrition and portion size again. Given information on depression, managing difficult emotions ,portion size. States relation with roommate better. Client needs support and reassurance. Follow up tomorrow .

## 2016-06-15 ENCOUNTER — Ambulatory Visit (INDEPENDENT_AMBULATORY_CARE_PROVIDER_SITE_OTHER): Payer: Self-pay | Admitting: Obstetrics & Gynecology

## 2016-06-15 ENCOUNTER — Encounter: Payer: Self-pay | Admitting: Obstetrics & Gynecology

## 2016-06-15 VITALS — BP 114/74 | Ht 65.0 in | Wt 205.8 lb

## 2016-06-15 DIAGNOSIS — N939 Abnormal uterine and vaginal bleeding, unspecified: Secondary | ICD-10-CM

## 2016-06-15 DIAGNOSIS — N938 Other specified abnormal uterine and vaginal bleeding: Secondary | ICD-10-CM

## 2016-06-15 DIAGNOSIS — G5792 Unspecified mononeuropathy of left lower limb: Secondary | ICD-10-CM

## 2016-06-15 DIAGNOSIS — F329 Major depressive disorder, single episode, unspecified: Secondary | ICD-10-CM

## 2016-06-15 DIAGNOSIS — F32A Depression, unspecified: Secondary | ICD-10-CM

## 2016-06-15 DIAGNOSIS — M7989 Other specified soft tissue disorders: Secondary | ICD-10-CM

## 2016-06-15 DIAGNOSIS — E0800 Diabetes mellitus due to underlying condition with hyperosmolarity without nonketotic hyperglycemic-hyperosmolar coma (NKHHC): Secondary | ICD-10-CM

## 2016-06-15 LAB — CBC
HEMATOCRIT: 30.2 % — AB (ref 35.0–45.0)
Hemoglobin: 9.8 g/dL — ABNORMAL LOW (ref 11.7–15.5)
MCH: 27.5 pg (ref 27.0–33.0)
MCHC: 32.5 g/dL (ref 32.0–36.0)
MCV: 84.6 fL (ref 80.0–100.0)
MPV: 9 fL (ref 7.5–12.5)
Platelets: 282 10*3/uL (ref 140–400)
RBC: 3.57 MIL/uL — AB (ref 3.80–5.10)
RDW: 14.6 % (ref 11.0–15.0)
WBC: 5.2 10*3/uL (ref 3.8–10.8)

## 2016-06-15 MED ORDER — MEGESTROL ACETATE 40 MG PO TABS: 40.0000 mg | ORAL_TABLET | Freq: Every day | ORAL | 8 refills | Status: DC

## 2016-06-15 NOTE — Patient Instructions (Signed)

## 2016-06-15 NOTE — Progress Notes (Signed)
History:  40 y.o. [id number] here today for reeval of AUB. Pt reports that the  Megace has led to amenorrhea which makes her very happy.  She denies bleeding or pelvic pain at present.  She has been taking Megace 80mg  daily.  Last menses was 03/2016.     Pt was seen by cardiology after last visit and was noted to have '90% blockage' of one of her major vessels and is now s/p a stent.    The following portions of the patient's history were reviewed and updated as appropriate: allergies, current medications, past family history, past medical history, past social history, past surgical history and problem list.  Review of Systems:  Pertinent items are noted in HPI.  Objective:  Physical Exam Blood pressure 114/74, height 5\' 5"  (1.651 m), weight 205 lb 12.8 oz (93.4 kg). Gen: NAD Lungs: CTA CV: RRR Abd: Soft, nontender and nondistended Pelvic: deferred  Labs and Imaging CBC    Component Value Date/Time   WBC 5.5 04/08/2016 1211   WBC 5.8 04/02/2016 1544   RBC 3.87 04/08/2016 1211   RBC 3.97 04/02/2016 1544   HGB 9.9 (L) 04/02/2016 1544   HCT 31.2 (L) 04/08/2016 1211   PLT 303 04/08/2016 1211   MCV 81 04/08/2016 1211   MCH 25.6 (L) 04/08/2016 1211   MCH 24.9 (L) 04/02/2016 1544   MCHC 31.7 04/08/2016 1211   MCHC 31.6 04/02/2016 1544   RDW 16.6 (H) 04/08/2016 1211   LYMPHSABS 2.0 01/03/2016 0400   MONOABS 0.5 01/03/2016 0400   EOSABS 0.2 01/03/2016 0400   BASOSABS 0.0 01/03/2016 0400      Assessment & Plan:  AUB- sx improved with Megace.  Will attempt to decrease dosage.  If sx still resolved will keep at lower dosage for  months and then attempt a trail off.  Pt prevrhad anemia thought due to menorrhagia.  Will eval with a TSH and check resolution with a CBC.   Repeat CBC today TSH today Decrease Megace from 80mg  daily to 40mg  per day Pt to call if bleeding return on lower dosage of megace.  Quantavius Humm L. Harraway-Smith, M.D., [name]

## 2016-06-15 NOTE — Congregational Nurse Program (Signed)
Congregational Nurse Program Note  Date of Encounter: 06/15/2016  Past Medical History: Past Medical History:  Diagnosis Date  . Asthma   . CAD S/P BMS PCI to prox LAD 03/31/2016   Ost LAD to Mid LAD lesion, 90% stenosed. Post intervention - Vision BMS 3.0 mm x 18 mm (~3.5 mm) there is a 0% residual stenosis.   . Depression   . Diabetic retinopathy ([place])   . Heart murmur   . Hyperlipidemia   . Hypertension   . Iron deficiency anemia   . Menorrhagia   . Noncompliance with medications   . Peripheral neuropathy ([place])   . Pneumonia "several times"  . Retinopathy of both eyes   . Seasonal allergies   . Sickle cell trait ([place])   . Type II diabetes mellitus ([place])    uncontrolled    Encounter Details:     CNP Questionnaire - 06/15/16 1831      Patient Demographics   Is this a new or existing patient? Existing   Patient is considered a/an Not Applicable   Race African-American/Black     Patient Assistance   Location of Patient Assistance Not Applicable   Patient's financial/insurance status [facility]   Uninsured Patient Yes   Interventions Follow-up/Education/Support provided after completed appt.   Patient referred to apply for the following financial assistance [employer];[place] insecurities addressed Not Applicable   Transportation assistance No   Assistance securing medications No   Educational health offerings Chronic disease;Diabetes;Behavioral health     Encounter Details   Primary purpose of visit Spiritual Care/Support Visit;Chronic Illness/Condition Visit;Education/Health Concerns   Was an Emergency Department visit averted? Not Applicable   Does patient have a medical provider? Yes   Patient referred to Follow up with established PCP   Was a mental health screening completed? (GAINS tool) No   Does patient have dental issues? No   Does patient have vision issues? Yes   Was a vision  referral made? No   Does your patient have an abnormal blood glucose today? Yes   Since previous encounter, have you referred patient for abnormal blood glucose that resulted in a new diagnosis or medication change? No   Was there a life-saving intervention made? No    Client in past her visit to see OB?GYN today states her blood pressure was 114/74 good  Today ,slept  Last night, weight 205 lbs, am blood  Sugar was 98 , pm blood sugar was 114. Ask if she should  Take her insulin  Instructed to  Take her insulin as her blood levels go from 85-240 mg, client was preparing for dinner also . Also saw the therapist  At  [facility] and will be getting another one. Marland Kitchen Applied for  [employer] and orange card today . Client had a successful day. Seen for  Uterine  Bleeding and  Dysfunctional uterine  Bleeding. Was in good  Spirits  Today .  Client was wearing her compression hose  Today . Follow  weekly

## 2016-06-16 LAB — TSH: TSH: 0.91 mIU/L

## 2016-06-18 ENCOUNTER — Encounter (HOSPITAL_COMMUNITY): Payer: Self-pay | Admitting: *Deleted

## 2016-06-18 ENCOUNTER — Emergency Department (HOSPITAL_COMMUNITY): Payer: PRIVATE HEALTH INSURANCE

## 2016-06-18 ENCOUNTER — Emergency Department (HOSPITAL_COMMUNITY)
Admission: EM | Admit: 2016-06-18 | Discharge: 2016-06-18 | Disposition: A | Discharge status: Home / Self Care | Payer: PRIVATE HEALTH INSURANCE | Attending: Emergency Medicine | Admitting: Emergency Medicine

## 2016-06-18 DIAGNOSIS — J45909 Unspecified asthma, uncomplicated: Secondary | ICD-10-CM | POA: Insufficient documentation

## 2016-06-18 DIAGNOSIS — I2510 Atherosclerotic heart disease of native coronary artery without angina pectoris: Secondary | ICD-10-CM | POA: Diagnosis not present

## 2016-06-18 DIAGNOSIS — E11319 Type 2 diabetes mellitus with unspecified diabetic retinopathy without macular edema: Secondary | ICD-10-CM | POA: Diagnosis not present

## 2016-06-18 DIAGNOSIS — Z7984 Long term (current) use of oral hypoglycemic drugs: Secondary | ICD-10-CM | POA: Diagnosis not present

## 2016-06-18 DIAGNOSIS — Z955 Presence of coronary angioplasty implant and graft: Secondary | ICD-10-CM | POA: Insufficient documentation

## 2016-06-18 DIAGNOSIS — Z794 Long term (current) use of insulin: Secondary | ICD-10-CM | POA: Insufficient documentation

## 2016-06-18 DIAGNOSIS — Z7982 Long term (current) use of aspirin: Secondary | ICD-10-CM | POA: Diagnosis not present

## 2016-06-18 DIAGNOSIS — I10 Essential (primary) hypertension: Secondary | ICD-10-CM | POA: Insufficient documentation

## 2016-06-18 DIAGNOSIS — Z87891 Personal history of nicotine dependence: Secondary | ICD-10-CM | POA: Insufficient documentation

## 2016-06-18 DIAGNOSIS — E1142 Type 2 diabetes mellitus with diabetic polyneuropathy: Secondary | ICD-10-CM | POA: Insufficient documentation

## 2016-06-18 DIAGNOSIS — R197 Diarrhea, unspecified: Secondary | ICD-10-CM

## 2016-06-18 LAB — GASTROINTESTINAL PANEL BY PCR, STOOL (REPLACES STOOL CULTURE)

## 2016-06-18 LAB — CBC
HEMATOCRIT: 33.4 % — AB (ref 36.0–46.0)
HEMOGLOBIN: 10.5 g/dL — AB (ref 12.0–15.0)
MCH: 27.3 pg (ref 26.0–34.0)
MCHC: 31.4 g/dL (ref 30.0–36.0)
MCV: 87 fL (ref 78.0–100.0)
Platelets: 282 10*3/uL (ref 150–400)
RBC: 3.84 MIL/uL — AB (ref 3.87–5.11)
RDW: 13.8 % (ref 11.5–15.5)
WBC: 6.3 10*3/uL (ref 4.0–10.5)

## 2016-06-18 LAB — WET PREP, GENITAL
CLUE CELLS WET PREP: NONE SEEN
Sperm: NONE SEEN
TRICH WET PREP: NONE SEEN
YEAST WET PREP: NONE SEEN

## 2016-06-18 LAB — COMPREHENSIVE METABOLIC PANEL
ALBUMIN: 2.7 g/dL — AB (ref 3.5–5.0)
ALT: 15 U/L (ref 14–54)
AST: 22 U/L (ref 15–41)
Alkaline Phosphatase: 73 U/L (ref 38–126)
Anion gap: 5 (ref 5–15)
BILIRUBIN TOTAL: 0.4 mg/dL (ref 0.3–1.2)
BUN: 23 mg/dL — AB (ref 6–20)
CHLORIDE: 112 mmol/L — AB (ref 101–111)
CO2: 24 mmol/L (ref 22–32)
CREATININE: 1.41 mg/dL — AB (ref 0.44–1.00)
Calcium: 8.9 mg/dL (ref 8.9–10.3)
GFR calc Af Amer: 53 mL/min — ABNORMAL LOW (ref >60)
GFR, EST NON AFRICAN AMERICAN: 46 mL/min — AB (ref >60)
GLUCOSE: 112 mg/dL — AB (ref 65–99)
POTASSIUM: 3.9 mmol/L (ref 3.5–5.1)
Sodium: 141 mmol/L (ref 135–145)
TOTAL PROTEIN: 6.1 g/dL — AB (ref 6.5–8.1)

## 2016-06-18 LAB — HIV ANTIBODY (ROUTINE TESTING W REFLEX): HIV SCREEN 4TH GENERATION: NONREACTIVE

## 2016-06-18 LAB — I-STAT TROPONIN, ED: TROPONIN I, POC: 0 ng/mL (ref 0.00–0.08)

## 2016-06-18 LAB — C DIFFICILE QUICK SCREEN W PCR REFLEX
C Diff antigen: POSITIVE — AB
C Diff toxin: NEGATIVE

## 2016-06-18 LAB — POC OCCULT BLOOD, ED: Fecal Occult Bld: NEGATIVE

## 2016-06-18 LAB — RPR: RPR: NONREACTIVE

## 2016-06-18 LAB — CLOSTRIDIUM DIFFICILE BY PCR: Toxigenic C. Difficile by PCR: POSITIVE — AB

## 2016-06-18 MED ORDER — LOPERAMIDE HCL 2 MG PO CAPS: 2.0000 mg | ORAL_CAPSULE | Freq: Four times a day (QID) | ORAL | 0 refills | Status: DC | PRN

## 2016-06-18 MED ORDER — METOPROLOL TARTRATE 25 MG PO TABS
50.0000 mg | ORAL_TABLET | Freq: Once | ORAL | Status: AC
Start: 2016-06-18 — End: 2016-06-18
  Administered 2016-06-18: 50 mg via ORAL
  Filled 2016-06-18: qty 2

## 2016-06-18 MED ORDER — SODIUM CHLORIDE 0.9 % IV BOLUS (SEPSIS)
1000.0000 mL | Freq: Once | INTRAVENOUS | Status: DC
Start: 2016-06-18 — End: 2016-06-18

## 2016-06-18 MED ORDER — LOPERAMIDE HCL 2 MG PO CAPS
4.0000 mg | ORAL_CAPSULE | ORAL | Status: DC | PRN
  Administered 2016-06-18: 4 mg via ORAL
  Filled 2016-06-18: qty 2

## 2016-06-18 MED ORDER — ONDANSETRON HCL 4 MG/2ML IJ SOLN
4.0000 mg | Freq: Once | INTRAMUSCULAR | Status: AC
  Administered 2016-06-18: 4 mg via INTRAVENOUS
  Filled 2016-06-18: qty 2

## 2016-06-18 MED ORDER — ASPIRIN 81 MG PO CHEW
324.0000 mg | CHEWABLE_TABLET | Freq: Once | ORAL | Status: AC
Start: 2016-06-18 — End: 2016-06-18
  Administered 2016-06-18: 324 mg via ORAL
  Filled 2016-06-18: qty 4

## 2016-06-18 NOTE — ED Provider Notes (Signed)
[facility] DEPT Provider Note   CSN: [id number] Arrival date & time: 06/18/16  0705  First Provider Contact:  First MD Initiated Contact with Patient 06/18/16 [phone]        History   Chief Complaint Chief Complaint  Patient presents with  . Diarrhea    HPI  Blood pressure (!) 213/107, pulse 101, temperature 98.7 F (37.1 C), temperature source Oral, resp. rate 12, height 5\' 5"  (1.651 m), weight 93 kg, SpO2 100 %.  Jane [name] is a 40 y.o. female complaining of diarrhea onset approximately 10 days ago she has loose watery stool is non-melanotic and not grossly bloody. She has had 8 episodes this a.m. Patient denies nausea, vomiting, fever, abdominal pain. Patient is homeless and lives in a shelter, she is concerned because she is exposed to many germs. She hasn't had any antibiotic use in the last 2 months recently had cath with stent placement, she had chest pain this morning but it has resolved she has chest pain intermittently this is not atypical for her. She did have follow-up with [facility]. She normally takes a low-dose aspirin but she hasn't had any of her medications including her blood pressure medication this morning. She would also like to be checked for STD she had recent unprotected sex with no abnormal vaginal discharge.Patient has been on Megace for dysfunctional uterine bleeding, she recently saw her OB/GYN, no procedures are planned. OB/GYN has advised her she will need to stay on the Megace for at least a few more months.    Past Medical History:  Diagnosis Date  . Asthma   . CAD S/P BMS PCI to prox LAD 03/31/2016   Ost LAD to Mid LAD lesion, 90% stenosed. Post intervention - Vision BMS 3.0 mm x 18 mm (~3.5 mm) there is a 0% residual stenosis.   . Depression   . Diabetic retinopathy ([place])   . Heart murmur   . Hyperlipidemia   . Hypertension   . Iron deficiency anemia   . Menorrhagia   . Noncompliance with medications   . Peripheral neuropathy  ([place])   . Pneumonia "several times"  . Retinopathy of both eyes   . Seasonal allergies   . Sickle cell trait ([place])   . Type II diabetes mellitus ([place])    uncontrolled    Patient Active Problem List   Diagnosis Date Noted  . Poor social situation 04/19/2016  . Financial difficulties 04/08/2016  . CAD S/P BMS PCI to prox LAD 03/31/2016  . Orthostatic hypotension 03/31/2016  . Presence of bare metal stent in LAD coronary artery 03/31/2016  . Atypical angina ([place]) 03/29/2016  . Unstable angina ([place]) 03/29/2016  . Dysfunctional uterine bleeding 03/17/2016  . 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 Diabetic nephropathy ([place]) 12/22/2015  . Charcot foot due to diabetes mellitus ([place]) 09/07/2015  . Peripheral neuropathy ([place]) 09/20/2014  . Hypertension 08/16/2014  . Seasonal allergic rhinitis 08/16/2014  . Iron deficiency anemia 10/25/2013  . Healthcare maintenance 10/25/2013  . Obesity 09/26/2012  . Hyperlipidemia 02/26/2009  . 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, with long-term current use of insulin ([place]) 05/08/2007  . Asthma 05/08/2007    Past Surgical History:  Procedure Laterality Date  . CARDIAC CATHETERIZATION N/A 03/31/2016   Procedure: Left Heart Cath and Coronary Angiography;  Surgeon: Lorretta Harp, MD;  Location: [facility] CV LAB;  Service: Cardiovascular;  Laterality: N/A;  . CARDIAC CATHETERIZATION N/A 03/31/2016   Procedure:  Coronary Stent Intervention;  Surgeon: Lorretta Harp, MD;  Location: [facility];  Service: Cardiovascular;  Laterality: N/A;  . [place]  . CORONARY ANGIOPLASTY WITH STENT PLACEMENT  03/2016  . TUBAL LIGATION  1997    OB History    Gravida Para Term Preterm AB Living   2 2 1 1  0 2   SAB TAB Ectopic Multiple Live Births   0 0 0 0         Home Medications    Prior to Admission medications   Medication Sig Start Date End Date Taking? Authorizing  Provider  albuterol (PROVENTIL HFA;VENTOLIN HFA) 108 (90 Base) MCG/ACT inhaler Inhale 2 puffs into the lungs every 4 (four) hours as needed for wheezing or shortness of breath (or coughing). 05/19/16   Annia Belt, MD  aspirin 81 MG chewable tablet Chew 1 tablet (81 mg total) by mouth daily. 05/19/16   Annia Belt, MD  atorvastatin (LIPITOR) 40 MG tablet Take 1 tablet (40 mg total) by mouth daily at 6 PM. 05/19/16   Annia Belt, MD  clopidogrel (PLAVIX) 75 MG tablet Take 1 tablet (75 mg total) by mouth daily. Internal Medicine Program 05/23/16   Jule Ser, DO  ferrous sulfate 325 (65 FE) MG tablet Take 1 tablet (325 mg total) by mouth daily with breakfast. 05/19/16   Annia Belt, MD  HUMULIN 70/30 (70-30) 100 UNIT/ML injection INJECT 18 UNITS UNDER THE SKIN TWICE DAILY WITH MEALS 06/13/16   Alphonzo Grieve, MD  loperamide (IMODIUM) 2 MG capsule Take 1 capsule (2 mg total) by mouth 4 (four) times daily as needed for diarrhea or loose stools. 06/18/16   Jamai Dolce, PA-C  megestrol (MEGACE) 40 MG tablet Take 1 tablet (40 mg total) by mouth daily. Internal Medicine Program 06/15/16   Lavonia Drafts, MD  Megestrol Acetate (MEGACE ES PO) Take 1 tablet by mouth 2 (two) times daily. Patient states she takes an unknown dose of this medication twice daily last dose 03/12/2016    Historical Provider, MD  metFORMIN (GLUCOPHAGE) 500 MG tablet Take 1 tablet (500 mg total) by mouth 2 (two) times daily with a meal. 05/19/16 05/19/17  Annia Belt, MD  metoprolol (LOPRESSOR) 50 MG tablet Take 1 tablet (50 mg total) by mouth 2 (two) times daily. 05/19/16 05/19/17  Annia Belt, MD    Family History Family History  Problem Relation Age of Onset  . Stroke Mother   . Diabetes Mother   . Hypertension Mother   . Aneurysm Mother   . Diabetes Father   . Hypertension Father   . Diabetes Sister   . Hypertension Sister   . Diabetes Maternal Grandmother   . Diabetes Maternal  Grandfather   . Cancer Paternal Grandfather     Prostate    Social History Social History  Substance Use Topics  . Smoking status: Former Smoker    Packs/day: 0.25    Years: 1.00    Types: Cigarettes    Quit date: 08/29/2015  . Smokeless tobacco: Never Used  . Alcohol use No     Comment: 12/23/2015 "last alcohol was ~ Christmas 2016; that was very little alcohol"     Allergies   Review of patient's allergies indicates no known allergies.   Review of Systems Review of Systems   Physical Exam Updated Vital Signs BP 145/68   Pulse 85   Temp 98.7 F (37.1 C) (Oral)   Resp 17  Ht 5\' 5"  (1.651 m)   Wt 93 kg   LMP  (Approximate) Comment: 5/18 procedure stopped 2 weeks later  SpO2 100%   BMI 34.11 kg/m   Physical Exam  Constitutional: She is oriented to person, place, and time. She appears well-developed and well-nourished. No distress.  HENT:  Head: Normocephalic and atraumatic.  Mouth/Throat: Oropharynx is clear and moist.  Eyes: Conjunctivae and EOM are normal. Pupils are equal, round, and reactive to light.  Neck: Normal range of motion.  Cardiovascular: Normal rate, regular rhythm and intact distal pulses.   Pulmonary/Chest: Effort normal and breath sounds normal.  Abdominal: Soft. There is no tenderness.  Genitourinary:  Genitourinary Comments: Pelvic exam a chaperoned by nurse: No rashes or lesions, no abnormal vaginal discharge, no cervical or adnexal tenderness.  Musculoskeletal: Normal range of motion.  Walking due to left ankle, patient states that she has Charcot foot and lymphedema.  Neurological: She is alert and oriented to person, place, and time.  Skin: She is not diaphoretic.  Psychiatric: She has a normal mood and affect.  Nursing note and vitals reviewed.    ED Treatments / Results  Labs (all labs ordered are listed, but only abnormal results are displayed) Labs Reviewed  C DIFFICILE QUICK SCREEN W PCR REFLEX - Abnormal; Notable for the  following:       Result Value   C Diff antigen POSITIVE (*)    All other components within normal limits  WET PREP, GENITAL - Abnormal; Notable for the following:    WBC, Wet Prep HPF POC FEW (*)    All other components within normal limits  CBC - Abnormal; Notable for the following:    RBC 3.84 (*)    Hemoglobin 10.5 (*)    HCT 33.4 (*)    All other components within normal limits  COMPREHENSIVE METABOLIC PANEL - Abnormal; Notable for the following:    Chloride 112 (*)    Glucose, Bld 112 (*)    BUN 23 (*)    Creatinine, Ser 1.41 (*)    Total Protein 6.1 (*)    Albumin 2.7 (*)    GFR calc non Af Amer 46 (*)    GFR calc Af Amer 53 (*)    All other components within normal limits  GASTROINTESTINAL PANEL BY PCR, STOOL (REPLACES STOOL CULTURE)  CLOSTRIDIUM DIFFICILE BY PCR  RPR  HIV ANTIBODY (ROUTINE TESTING)  I-STAT TROPOININ, ED  POC OCCULT BLOOD, ED  GC/CHLAMYDIA PROBE AMP ([place]) NOT AT [facility]    EKG  EKG Interpretation  Date/Time:  Saturday June 18 2016 07:18:42 EDT Ventricular Rate:  99 PR Interval:    QRS Duration: 72 QT Interval:  323 QTC Calculation: 415 R Axis:   78 Text Interpretation:  Sinus rhythm Anterior infarct, old No significant change since last tracing Confirmed by ZACKOWSKI  MD, SCOTT [phone]) on 06/18/2016 7:24:19 AM       Radiology Dg Chest 2 View  Result Date: 06/18/2016 CLINICAL DATA:  Chest pain EXAM: CHEST  2 VIEW COMPARISON:  Mar 29, 2016 FINDINGS: The heart size and mediastinal contours are within normal limits. Both lungs are clear. The visualized skeletal structures are unremarkable. IMPRESSION: No active cardiopulmonary disease. Electronically Signed   By: Dorise Bullion III M.D   On: 06/18/2016 09:06    Procedures Procedures (including critical care time)  Medications Ordered in ED Medications  sodium chloride 0.9 % bolus 1,000 mL (not administered)  loperamide (IMODIUM) capsule 4 mg (not administered)  aspirin chewable  tablet 324 mg (324 mg Oral Given 06/18/16 0828)  metoprolol tartrate (LOPRESSOR) tablet 50 mg (50 mg Oral Given 06/18/16 0828)  ondansetron (ZOFRAN) injection 4 mg (4 mg Intravenous Given 06/18/16 0853)     Initial Impression / Assessment and Plan / ED Course  I have reviewed the triage vital signs and the nursing notes.  Pertinent labs & imaging results that were available during my care of the patient were reviewed by me and considered in my medical decision making (see chart for details).  Clinical Course    Vitals:   06/18/16 0828 06/18/16 0915 06/18/16 0930 06/18/16 0945  BP: 164/91 136/71 143/71 145/68  Pulse: 98 85 84 85  Resp:  (!) 27 16 17   Temp:      TempSrc:      SpO2:  97% 100% 100%  Weight:      Height:        Medications  sodium chloride 0.9 % bolus 1,000 mL (not administered)  loperamide (IMODIUM) capsule 4 mg (not administered)  aspirin chewable tablet 324 mg (324 mg Oral Given 06/18/16 0828)  metoprolol tartrate (LOPRESSOR) tablet 50 mg (50 mg Oral Given 06/18/16 0828)  ondansetron (ZOFRAN) injection 4 mg (4 mg Intravenous Given 06/18/16 0853)    ZAILYN MINTZ is 40 y.o. female presenting with Persistent diarrhea over the course of several weeks. No recent antibiotic use, patient is afebrile overall well appearing. Does not appear clinically dehydrated, abdominal exam is nonsurgical. Patient has CAD and had a recent stent placement, she states that she's had intermittent chest pain last episode resolving just this a.m. Blood pressure significantly elevated however she hasn't taken her medication this a.m. Patient will be given full dose aspirin and blood pressure medication will do a basic cardiac workup she's not having active chest pain right now. She also requesting STD check, pelvic exam without significant abnormality.  Wet prep with a few white blood cells no other significant abnormality, CBC with out acute abnormality, her creatinine is 1.41 this is reasonable  for her baseline.  Blood pressure has improved with her normal dose of oral metoprolol to 164/91. EKG and troponin without acute changes.  Quick C. difficile test with positive antigen but negative toxin. Full complement of GI pathogen pending, full STD pending.  Discussed case with attending physician who agrees with care plan and disposition.   Evaluation does not show pathology that would require ongoing emergent intervention or inpatient treatment. Pt is hemodynamically stable and mentating appropriately. Discussed findings and plan with patient/guardian, who agrees with care plan. All questions answered. Return precautions discussed and outpatient follow up given.   New Prescriptions   LOPERAMIDE (IMODIUM) 2 MG CAPSULE    Take 1 capsule (2 mg total) by mouth 4 (four) times daily as needed for diarrhea or loose stools.      Final Clinical Impressions(s) / ED Diagnoses   Final diagnoses:  Diarrhea, unspecified type    New Prescriptions New Prescriptions   LOPERAMIDE (IMODIUM) 2 MG CAPSULE    Take 1 capsule (2 mg total) by mouth 4 (four) times daily as needed for diarrhea or loose stools.     Monico Blitz, PA-C 06/18/16 [place], MD 06/19/16 1401

## 2016-06-18 NOTE — Discharge Instructions (Addendum)
Please follow with your primary care doctor in the next 2 days for a check-up. They must obtain records for further management.  ° °Do not hesitate to return to the Emergency Department for any new, worsening or concerning symptoms.  ° °

## 2016-06-18 NOTE — ED Notes (Signed)
Patient transported to X-ray 

## 2016-06-18 NOTE — ED Triage Notes (Signed)
Pt here via [facility] for diarrhea and vomiting since last Thurs.  States was seen by obgyn on Wed for uterine bleeding and was told her diarrhea may be r/t diabetes.  States diarrhea 8 x's today.

## 2016-06-20 ENCOUNTER — Telehealth: Payer: Self-pay | Admitting: General Practice

## 2016-06-20 DIAGNOSIS — D509 Iron deficiency anemia, unspecified: Secondary | ICD-10-CM

## 2016-06-20 LAB — GC/CHLAMYDIA PROBE AMP (~~LOC~~) NOT AT ARMC
CHLAMYDIA, DNA PROBE: NEGATIVE
NEISSERIA GONORRHEA: NEGATIVE

## 2016-06-20 NOTE — Telephone Encounter (Signed)
Per Dr Ihor Dow, patient is still anemic and needs to ensure she is taking iron TID now. Called patient, no answer- left message to call us back regarding result information. Will send mychart message.

## 2016-06-22 ENCOUNTER — Telehealth: Payer: Self-pay | Admitting: Internal Medicine

## 2016-06-22 DIAGNOSIS — M7989 Other specified soft tissue disorders: Secondary | ICD-10-CM

## 2016-06-22 DIAGNOSIS — E0800 Diabetes mellitus due to underlying condition with hyperosmolarity without nonketotic hyperglycemic-hyperosmolar coma (NKHHC): Secondary | ICD-10-CM

## 2016-06-22 DIAGNOSIS — D509 Iron deficiency anemia, unspecified: Secondary | ICD-10-CM

## 2016-06-22 NOTE — Congregational Nurse Program (Signed)
Congregational Nurse Program Note  Date of Encounter: 06/22/2016  Past Medical History: Past Medical History:  Diagnosis Date  . Asthma   . CAD S/P BMS PCI to prox LAD 03/31/2016   Ost LAD to Mid LAD lesion, 90% stenosed. Post intervention - Vision BMS 3.0 mm x 18 mm (~3.5 mm) there is a 0% residual stenosis.   . Depression   . Diabetic retinopathy ([place])   . Heart murmur   . Hyperlipidemia   . Hypertension   . Iron deficiency anemia   . Menorrhagia   . Noncompliance with medications   . Peripheral neuropathy ([place])   . Pneumonia "several times"  . Retinopathy of both eyes   . Seasonal allergies   . Sickle cell trait ([place])   . Type II diabetes mellitus ([place])    uncontrolled    Encounter Details:     CNP Questionnaire - 06/22/16 2312      Patient Demographics   Is this a new or existing patient? Existing   Patient is considered a/an Not Applicable   Race African-American/Black     Patient Assistance   Location of Patient Assistance Not Applicable   Patient's financial/insurance status Orange Card/Care Connects   Uninsured Patient Yes   Interventions Follow-up/Education/Support provided after completed appt.   Patient referred to apply for the following financial assistance [employer];Medicaid   Food insecurities addressed Not Applicable   Transportation assistance No   Assistance securing medications No   Educational health offerings Chronic disease;Diabetes;Nutrition;Medications     Encounter Details   Primary purpose of visit Spiritual Care/Support Visit;Post ED/Hospitalization Visit   Was an Emergency Department visit averted? Not Applicable   Does patient have a medical provider? Yes   Patient referred to Follow up with established PCP   Was a mental health screening completed? (GAINS tool) No   Does patient have dental issues? No   Does patient have vision issues? Yes   Was a vision referral made? No   Does your patient have an abnormal blood  pressure today? No   Since previous encounter, have you referred patient for abnormal blood pressure that resulted in a new diagnosis or medication change? No   Does your patient have an abnormal blood glucose today? Yes   Since previous encounter, have you referred patient for abnormal blood glucose that resulted in a new diagnosis or medication change? No   Was there a life-saving intervention made? No     Client in for her weekly check in with the nurse. States she was seen in ED this past week end , blood  Sugar dropped  . States blood  Sugar was 58 mg ,she was vomiting  ,felt bad. Blood sugar by report this am was 178 mg  And it has been up and down. On her way to  [employer] [facility]  [employer] .  Counseled  Regarding up and down of  Blood  Sugars, states she ate some things and smiled. Listened and supported client. Will monitor weekly . Encouraged to keep foot  elevated ,wear hose and monitor her intake. Shouldn't  Skip meals , counseled regarding vomiting losing electrolytes.

## 2016-06-22 NOTE — Telephone Encounter (Signed)
I received a forwarded chart from the [facility] ED, which patient had visited on 06/18/2016 for complaints of multiple episodes of watery diarrhea. During the visit, the C. Diff antigen was positive, and C. Diff toxin was negative; this was sent to PCR for confirmation. Her stool GI Panel by [facility] was negative. She was discharged from the ED with Loperamide.   Her PCR came back positive.  I called the patient and she reports she no longer has any symptoms; her last episode of diarrhea was 06/18/2016. Due to lack of symptoms, she will not be treated for active C. Diff infection. Patient was agreeable to the plan and had no further questions.  Patient was made aware to contact the [facility] if she has further symptoms to be re-evaluated. She is welcome to contact me with any questions or concerns.    Alphonzo Grieve, MD Internal Medicine, PGY1

## 2016-06-24 ENCOUNTER — Telehealth: Payer: Self-pay | Admitting: Dietician

## 2016-06-24 NOTE — Telephone Encounter (Signed)
Called patient to follow up on diabetes/low blood sugar/overall blood sugar control.

## 2016-06-28 DIAGNOSIS — E0800 Diabetes mellitus due to underlying condition with hyperosmolarity without nonketotic hyperglycemic-hyperosmolar coma (NKHHC): Secondary | ICD-10-CM

## 2016-06-28 DIAGNOSIS — M7989 Other specified soft tissue disorders: Secondary | ICD-10-CM

## 2016-06-28 MED FILL — CLOPIDOGREL 75 MG TABLET: 75 | 30 days supply | Qty: 30 | Fill #1

## 2016-06-28 MED FILL — MEGESTROL 40 MG TABLET: 40 | 30 days supply | Qty: 60 | Fill #1

## 2016-06-28 NOTE — Congregational Nurse Program (Signed)
Congregational Nurse Program Note  Date of Encounter: 06/28/2016  Past Medical History: Past Medical History:  Diagnosis Date  . Asthma   . CAD S/P BMS PCI to prox LAD 03/31/2016   Ost LAD to Mid LAD lesion, 90% stenosed. Post intervention - Vision BMS 3.0 mm x 18 mm (~3.5 mm) there is a 0% residual stenosis.   . Depression   . Diabetic retinopathy ([place])   . Heart murmur   . Hyperlipidemia   . Hypertension   . Iron deficiency anemia   . Menorrhagia   . Noncompliance with medications   . Peripheral neuropathy ([place])   . Pneumonia "several times"  . Retinopathy of both eyes   . Seasonal allergies   . Sickle cell trait ([place])   . Type II diabetes mellitus ([place])    uncontrolled    Encounter Details:     CNP Questionnaire - 06/28/16 1902      Patient Demographics   Is this a new or existing patient? Existing   Patient is considered a/an Not Applicable   Race African-American/Black     Patient Assistance   Location of Patient Assistance Not Applicable   Patient's financial/insurance status Orange Card/Care Connects   Uninsured Patient Yes   Interventions Follow-up/Education/Support provided after completed appt.   Patient referred to apply for the following financial assistance [employer] insecurities addressed Not Research scientist (physical sciences) Yes   Type of Assistance [employer] Given   Assistance securing medications Yes   Type of Assistance [facility]   Educational health offerings Diabetes;Chronic disease     Encounter Details   Primary purpose of visit Navigating the Healthcare System;Chronic Illness/Condition Visit   Does patient have a medical provider? Yes   Patient referred to Follow up with established PCP   Was a mental health screening completed? (GAINS tool) No   Does patient have dental issues? No   Does patient have vision issues? Yes   Was a vision referral made? No   Does your patient have an abnormal blood pressure  today? No   Since previous encounter, have you referred patient for abnormal blood pressure that resulted in a new diagnosis or medication change? No   Does your patient have an abnormal blood glucose today? No   Since previous encounter, have you referred patient for abnormal blood glucose that resulted in a new diagnosis or medication change? No   Was there a life-saving intervention made? No     Client saw nurse in hallway stated she needed to see nurse ,left note needs her medications  Unable to get them nurse will assist with getting her Plavix  And Megace ES  PO. Client also needs bus passes to get to [place] on tomorrow  ,gave her 2 bus passes.

## 2016-06-29 ENCOUNTER — Ambulatory Visit (INDEPENDENT_AMBULATORY_CARE_PROVIDER_SITE_OTHER): Payer: Medicaid Other | Admitting: Internal Medicine

## 2016-06-29 DIAGNOSIS — A047 Enterocolitis due to Clostridium difficile: Secondary | ICD-10-CM | POA: Diagnosis not present

## 2016-06-29 DIAGNOSIS — E0800 Diabetes mellitus due to underlying condition with hyperosmolarity without nonketotic hyperglycemic-hyperosmolar coma (NKHHC): Secondary | ICD-10-CM

## 2016-06-29 DIAGNOSIS — Z590 Homelessness: Secondary | ICD-10-CM | POA: Diagnosis not present

## 2016-06-29 DIAGNOSIS — F32A Depression, unspecified: Secondary | ICD-10-CM

## 2016-06-29 DIAGNOSIS — Z79899 Other long term (current) drug therapy: Secondary | ICD-10-CM

## 2016-06-29 DIAGNOSIS — A0472 Enterocolitis due to Clostridium difficile, not specified as recurrent: Secondary | ICD-10-CM | POA: Insufficient documentation

## 2016-06-29 DIAGNOSIS — M7989 Other specified soft tissue disorders: Secondary | ICD-10-CM

## 2016-06-29 DIAGNOSIS — F329 Major depressive disorder, single episode, unspecified: Secondary | ICD-10-CM

## 2016-06-29 DIAGNOSIS — R197 Diarrhea, unspecified: Secondary | ICD-10-CM

## 2016-06-29 MED ORDER — CLOPIDOGREL BISULFATE 75 MG PO TABS: 75.0000 mg | ORAL_TABLET | Freq: Every day | ORAL | 1 refills | Status: DC

## 2016-06-29 MED ORDER — MEGESTROL ACETATE 40 MG PO TABS: 40.0000 mg | ORAL_TABLET | Freq: Every day | ORAL | 1 refills | Status: DC

## 2016-06-29 MED ORDER — METRONIDAZOLE 500 MG PO TABS: 500.0000 mg | ORAL_TABLET | Freq: Three times a day (TID) | ORAL | 0 refills | Status: AC

## 2016-06-29 MED ORDER — METRONIDAZOLE 500 MG PO TABS
500.0000 mg | ORAL_TABLET | Freq: Three times a day (TID) | ORAL | 0 refills | Status: DC
Start: 2016-06-29 — End: 2016-06-29

## 2016-06-29 MED FILL — metroNIDAZOLE 500 MG TABS: 500 | 14 days supply | Qty: 42 | Fill #0

## 2016-06-29 NOTE — Patient Instructions (Signed)
Thank you for your visit today  Please start taking the metronidazole 500 mg three times a day for 2 weeks  Please return in 2 weeks if your diarrhea continues or if you notice no improvement  Please inform us or go to the ER if you have fevers, have abdominal pain, or worsening diarrhea

## 2016-06-29 NOTE — Assessment & Plan Note (Signed)
Patient is here for diarrhea and says it started on July 21st. She had gone to the ER and CDif antigen was +, but toxin neg. The toxin was positive by PCR which is more sensitive test.  The diarrhea had subsided but it is back. Happens about 6-7 times per day and it is watery-green and nonbloody.  There is no recent antibiotic use, but she was hospitalized recently for stent placement. She lives in a homeless shelter.  A CBC was done in ER on 8/5 and WC was 6.3. BMET showed Cr of 1.41, which is not much different from prior. She has had progression of her CKD in the prior year.   A: Nonsevere initial episode of CDI  P Flagyl 500 mg TID for 14 days.  RTC in 2 weeks if symptoms persist or worsen.Advised to go to ER if she devlelops abd pain, swelling, change in BM, or systemic symptoms

## 2016-06-29 NOTE — Congregational Nurse Program (Signed)
Congregational Nurse Program Note  Date of Encounter: 06/29/2016  Past Medical History: Past Medical History:  Diagnosis Date  . Asthma   . CAD S/P BMS PCI to prox LAD 03/31/2016   Ost LAD to Mid LAD lesion, 90% stenosed. Post intervention - Vision BMS 3.0 mm x 18 mm (~3.5 mm) there is a 0% residual stenosis.   . Depression   . Diabetic retinopathy ([place])   . Heart murmur   . Hyperlipidemia   . Hypertension   . Iron deficiency anemia   . Menorrhagia   . Noncompliance with medications   . Peripheral neuropathy ([place])   . Pneumonia "several times"  . Retinopathy of both eyes   . Seasonal allergies   . Sickle cell trait ([place])   . Type II diabetes mellitus ([place])    uncontrolled    Encounter Details:     CNP Questionnaire - 06/29/16 2239      Patient Demographics   Is this a new or existing patient? Existing   Race African-American/Black     Patient Assistance   Location of Patient Assistance Not Applicable   Patient's financial/insurance status Orange Card/Care Connects   Uninsured Patient Yes   Interventions Follow-up/Education/Support provided after completed appt.;Appt. has been completed   Patient referred to apply for the following financial assistance Not Applicable   Food insecurities addressed Not Applicable   Assistance securing medications Yes   Type of Assistance [facility]   Educational health offerings Diabetes;Acute disease;Nutrition     Encounter Details   Primary purpose of visit Acute Illness/Condition Visit;Education/Health Concerns;Chronic Illness/Condition Visit;Post PCP Visit   Was an Emergency Department visit averted? Not Applicable   Does patient have a medical provider? Yes   Patient referred to Follow up with established PCP   Was a mental health screening completed? (GAINS tool) No   Does patient have dental issues? No   Does patient have vision issues? Yes   Was a vision referral made? No   Does your patient have an abnormal blood  pressure today? Yes   Since previous encounter, have you referred patient for abnormal blood pressure that resulted in a new diagnosis or medication change? No   Does your patient have an abnormal blood glucose today? Yes   Since previous encounter, have you referred patient for abnormal blood glucose that resulted in a new diagnosis or medication change? No   Was there a life-saving intervention made? No         Amb Nursing Assessment - 06/29/16 1000      Pre-visit preparation   Pre-visit preparation completed Yes     Pain Assessment   Pain Assessment Faces   Faces Pain Scale Hurts a little bit   Pain Location Leg   Pain Orientation Left   Pain Radiating Towards goes to foot   Pain Descriptors / Indicators Aching   Pain Onset More than a month ago   Pain Frequency Intermittent   Pain Relieving Factors none   Effect of Pain on Daily Activities hurts to walk     Nutrition Screen   BMI - recorded 36.56   Nutritional Status BMI > 30  Obese   Nutritional Risks None   Diabetes Yes   CBG done? No   CBG resulted in Enter/ Edit results? No   Did pt. bring in CBG monitor from home? Yes   Glucose Meter Downloaded? Yes     Functional Status   Activities of Daily Living Independent   Ambulation Independent  with device- listed below   Home Assistive Devices/Equipment Other (Comment)  leg scooter   Medication Administration Independent   Home Management Independent     Risk/Barriers  Assessment   Barriers to Care Management & Learning Level of Motivation   Psychosocial Barriers Financial need;Uninsured/under-insured;Lack of social support;Homeless  [facility]     Abuse/Neglect Assessment   Do you feel unsafe in your current relationship? No   Do you feel physically threatened by others? No   Anyone hurting you at home, work, or school? No   Unable to ask? No     Patient Literacy   How often do you need to have someone help you when you read  instructions, pamphlets, or other written materials from your doctor or pharmacy? 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 12th/college     Investment banker, operational Needed? No     Comments   Information entered by : Modena Morrow 06/29/2016 1548     Nurse picked up clients  Medication from [place] patient pharmacy. She had an appointment  Today at [facility] and was diagnosed with  Clostridium  Difficile Infection.Her blood  Pressure was up 159/91 states her blood sugar before dinner was 69 but did not eat lunch ,encouraged to eat a snack and go to dinner then take her medication. Patient's blood sugars  Are ranging 58 mg - 214 mg or more. Counseled regarding importance of eating small meals through out the day ,dropping in blood sugars  Can be dangerous.. Reviewed tratment orders for infection with client. Client will return for follow up with  MD in 2 weeks take medication.

## 2016-06-29 NOTE — Progress Notes (Signed)
   CC: diarrhea HPI: [name] is a 40 y.o. woman with PMH noted below here for diarrhea  Please see Problem List/A&P for the status of the patient's chronic medical problems   Past Medical History:  Diagnosis Date  . Asthma   . CAD S/P BMS PCI to prox LAD 03/31/2016   Ost LAD to Mid LAD lesion, 90% stenosed. Post intervention - Vision BMS 3.0 mm x 18 mm (~3.5 mm) there is a 0% residual stenosis.   . Depression   . Diabetic retinopathy ([place])   . Heart murmur   . Hyperlipidemia   . Hypertension   . Iron deficiency anemia   . Menorrhagia   . Noncompliance with medications   . Peripheral neuropathy ([place])   . Pneumonia "several times"  . Retinopathy of both eyes   . Seasonal allergies   . Sickle cell trait ([place])   . Type II diabetes mellitus ([place])    uncontrolled    Review of Systems:  Constitutional: Negative for fever, chills,  Respiratory: Negative for cough, shortness of breath Gastrointestinal: has some nausea and vomiting. Diarrhea 6-7 times per day since July 21st. Nonbloody but watery. No abd pain   Physical Exam: Vitals:   06/29/16 0949  BP: (!) 159/91  Pulse: 87  Temp: 98.6 F (37 C)  TempSrc: Oral  SpO2: 100%  Weight: 219 lb 11.2 oz (99.7 kg)    General: A&O, in NAD HEENT: MMM CV: RRR, normal s1, s2, no m/r/g,  Resp: equal and symmetric breath sounds, no wheezing or crackles  Abdomen: soft, nontender, nondistended, +BS   Assessment & Plan:   See encounters tab for problem based medical decision making. Patient discussed with Dr. Evette [name]

## 2016-06-30 NOTE — Progress Notes (Signed)
Internal Medicine Clinic Attending  Case discussed with Dr. Saraiya at the time of the visit.  We reviewed the resident's history and exam and pertinent patient test results.  I agree with the assessment, diagnosis, and plan of care documented in the resident's note.  

## 2016-07-12 DIAGNOSIS — E0800 Diabetes mellitus due to underlying condition with hyperosmolarity without nonketotic hyperglycemic-hyperosmolar coma (NKHHC): Secondary | ICD-10-CM

## 2016-07-12 DIAGNOSIS — F32A Depression, unspecified: Secondary | ICD-10-CM

## 2016-07-12 DIAGNOSIS — M7989 Other specified soft tissue disorders: Secondary | ICD-10-CM

## 2016-07-12 DIAGNOSIS — F329 Major depressive disorder, single episode, unspecified: Secondary | ICD-10-CM

## 2016-07-13 ENCOUNTER — Encounter: Payer: Self-pay | Admitting: Dietician

## 2016-07-13 ENCOUNTER — Encounter: Payer: Self-pay | Admitting: Internal Medicine

## 2016-07-13 ENCOUNTER — Ambulatory Visit (INDEPENDENT_AMBULATORY_CARE_PROVIDER_SITE_OTHER): Payer: Self-pay | Admitting: Internal Medicine

## 2016-07-13 DIAGNOSIS — A047 Enterocolitis due to Clostridium difficile: Secondary | ICD-10-CM

## 2016-07-13 DIAGNOSIS — Z79899 Other long term (current) drug therapy: Secondary | ICD-10-CM

## 2016-07-13 DIAGNOSIS — A0472 Enterocolitis due to Clostridium difficile, not specified as recurrent: Secondary | ICD-10-CM

## 2016-07-13 DIAGNOSIS — I10 Essential (primary) hypertension: Secondary | ICD-10-CM

## 2016-07-13 NOTE — Assessment & Plan Note (Signed)
Patient is here for 2 week follow up after starting Flagyl for her Cdif PCR toxin positive diarrhea. She says that her diarrhea has resolved, and she is compliant with her flagyl. She says that her stools are formed and denies any hematochezia. Denies any abd pain, fevers, or other systemic symptoms.   A: Nonsevere initial episode of CDI, resolved   P -finish the flagyl course -advised to return to clinic if diarrhea recurs

## 2016-07-13 NOTE — Assessment & Plan Note (Signed)
BP Readings from Last 3 Encounters:  07/13/16 (!) 140/100  06/29/16 (!) 159/91  06/18/16 132/63   Patient was slightly hypertensive to 140/100 today which she attributes to her ankle pain. She tookher metoprolol this morning  Plan -continue metoprolol -follow up with PCP in 2 months for BP recheckl

## 2016-07-13 NOTE — Patient Instructions (Addendum)
Thank you for your visit today  Please finish your antibiotic course  Please let us know if your diarrhea returns   Please follow up in 2 months with your PCP

## 2016-07-13 NOTE — Progress Notes (Signed)
Spoke with patient briefly while she was in the office. She has significantly improved her blood sugar (average 130 mg/dl, range 54 to 241mg /dl; reports the one low blood sugar was from decreased food intake) and says she can tell a difference in her healing and ability to move her foot. She reports having all the supplies and support she needs except is short on syringes. She was provided with 60 sample 30 unit syringes today.  Plan:Continue to monitor and support patient's diabetes self management as needed.

## 2016-07-13 NOTE — Progress Notes (Signed)
   CC: follow up for Cdif HPI: [name] is a 40 y.o. woman with PMH noted below here for follow up of her Cdif   Please see Problem List/A&P for the status of the patient's chronic medical problems   Past Medical History:  Diagnosis Date  . Asthma   . CAD S/P BMS PCI to prox LAD 03/31/2016   Ost LAD to Mid LAD lesion, 90% stenosed. Post intervention - Vision BMS 3.0 mm x 18 mm (~3.5 mm) there is a 0% residual stenosis.   . Depression   . Diabetic retinopathy ([place])   . Heart murmur   . Hyperlipidemia   . Hypertension   . Iron deficiency anemia   . Menorrhagia   . Noncompliance with medications   . Peripheral neuropathy ([place])   . Pneumonia "several times"  . Retinopathy of both eyes   . Seasonal allergies   . Sickle cell trait ([place])   . Type II diabetes mellitus ([place])    uncontrolled    Review of Systems: Denies fevers, chills, fatigue, Denies n/v/, diarrhea, abd pain, or hematochezia. Says formed stools  Physical Exam: Vitals:   07/13/16 0841 07/13/16 0854  BP: (!) 178/105 (!) 140/100  Pulse: 87 88  Temp: 97.7 F (36.5 C)   TempSrc: Oral   SpO2: 100%   Weight: 219 lb (99.3 kg)   Height: 5\' 5"  (1.651 m)     General: A&O, in NAD CV: RRR, normal s1, s2, no m/r/g,  Resp: equal and symmetric breath sounds, no wheezing or crackles  Abdomen: soft, nontender, nondistended, +BS   Assessment & Plan:   See encounters tab for problem based medical decision making. Patient discussed with Dr. Lynnae January

## 2016-07-15 ENCOUNTER — Ambulatory Visit: Payer: Self-pay

## 2016-07-15 ENCOUNTER — Encounter: Payer: Self-pay | Admitting: Licensed Clinical Social Worker

## 2016-07-15 NOTE — Progress Notes (Signed)
Internal Medicine Clinic Attending  Case discussed with Dr. Saraiya at the time of the visit.  We reviewed the resident's history and exam and pertinent patient test results.  I agree with the assessment, diagnosis, and plan of care documented in the resident's note.  

## 2016-07-15 NOTE — Progress Notes (Signed)
Ms. Bonillas presents to [facility] today requesting to meet with CSW following her appointment with the Financial counselor.  Ms. Mamo requesting further explanation of the referrals provided with the [facility] card and CFA.  Pt notified this worker does not utilize the [facility] card referral; however, Ms. Nuernberger was patient and allowed CSW time to obtain information from one of the [facility] referral coordinators.  Pt informed of referral process for the [facility] card and providers within [place] that accept CFA.  Pt encouraged to schedule an appointment to discuss concerns regarding specific requested referrals.  CSW provided Ms. Laurence Ferrari with information on [facility], as pt voiced concern regarding needed follow up for vision problems.   Ms. Nored had yet to completed the [facility] transportation application.  CSW provided application and discuss services available.  Pt's application faxed to [facility] today.  Pt denies additional social work needs at this time and is aware is available as needed.  Pt provided with CSW contact information

## 2016-07-18 NOTE — Congregational Nurse Program (Signed)
Congregational Nurse Program Note  Date of Encounter: 07/12/2016  Past Medical History: Past Medical History:  Diagnosis Date  . Asthma   . CAD S/P BMS PCI to prox LAD 03/31/2016   Ost LAD to Mid LAD lesion, 90% stenosed. Post intervention - Vision BMS 3.0 mm x 18 mm (~3.5 mm) there is a 0% residual stenosis.   . Depression   . Diabetic retinopathy ([place])   . Heart murmur   . Hyperlipidemia   . Hypertension   . Iron deficiency anemia   . Menorrhagia   . Noncompliance with medications   . Peripheral neuropathy ([place])   . Pneumonia "several times"  . Retinopathy of both eyes   . Seasonal allergies   . Sickle cell trait ([place])   . Type II diabetes mellitus ([place])    uncontrolled    Encounter Details:     CNP Questionnaire - 07/18/16 2256      Patient Demographics   Is this a new or existing patient? Existing   Patient is considered a/an Not Applicable   Race African-American/Black     Patient Assistance   Location of Patient Assistance Not Applicable   Patient's financial/insurance status Orange Oncologist;Self-Pay   Uninsured Patient Yes   Interventions Follow-up/Education/Support provided after completed appt.;Appt. has been completed   Patient referred to apply for the following financial assistance [employer] insecurities addressed Not Applicable   Transportation assistance No   Assistance securing medications No   Educational health offerings Chronic disease;Diabetes;Medications     Encounter Details   Primary purpose of visit Education/Health Concerns;Spiritual Care/Support Visit   Was an Emergency Department visit averted? Not Applicable   Does patient have a medical provider? Yes   Patient referred to Follow up with established PCP   Was a mental health screening completed? (GAINS tool) No   Does patient have dental issues? No   Does patient have vision issues? Yes   Was a vision referral made? No   Does your patient have an  abnormal blood pressure today? No   Since previous encounter, have you referred patient for abnormal blood pressure that resulted in a new diagnosis or medication change? No   Does your patient have an abnormal blood glucose today? No   Since previous encounter, have you referred patient for abnormal blood glucose that resulted in a new diagnosis or medication change? No   Was there a life-saving intervention made? No         Amb Nursing Assessment - 07/13/16 0854      Pain Assessment   Pain Score 5    Pain Location Leg   Pain Orientation Left   Pain Radiating Towards goes to foot    Pain Descriptors / Indicators Aching   Pain Onset More than a month ago   Pain Frequency Constant   Effect of Pain on Daily Activities hurts to walk    Brief encoounter in hallway with client . States she has been approved for housing and was very happy . States blood  Sugars have been okay . To come see nurse for a full visit  Ina little while . Not wearing compression stocking  Today ,states she  Must  Find them in her room . Encouraged to wear her stocking take care of her leg . Client never showed up  For  viist except  Brief encounter in hallway . Will follow  Up  Next week.

## 2016-07-22 MED FILL — MEGESTROL 40 MG TABLET: 40 | 30 days supply | Qty: 60 | Fill #2

## 2016-07-22 MED FILL — CLOPIDOGREL 75 MG TABLET: 75 | 30 days supply | Qty: 30 | Fill #2

## 2016-07-27 DIAGNOSIS — F32A Depression, unspecified: Secondary | ICD-10-CM

## 2016-07-27 DIAGNOSIS — M7989 Other specified soft tissue disorders: Secondary | ICD-10-CM

## 2016-07-27 DIAGNOSIS — E0800 Diabetes mellitus due to underlying condition with hyperosmolarity without nonketotic hyperglycemic-hyperosmolar coma (NKHHC): Secondary | ICD-10-CM

## 2016-07-27 DIAGNOSIS — F329 Major depressive disorder, single episode, unspecified: Secondary | ICD-10-CM

## 2016-07-27 NOTE — Congregational Nurse Program (Signed)
Congregational Nurse Program Note  Date of Encounter: 07/27/2016  Past Medical History: Past Medical History:  Diagnosis Date  . Asthma   . CAD S/P BMS PCI to prox LAD 03/31/2016   Ost LAD to Mid LAD lesion, 90% stenosed. Post intervention - Vision BMS 3.0 mm x 18 mm (~3.5 mm) there is a 0% residual stenosis.   . Depression   . Diabetic retinopathy ([place])   . Heart murmur   . Hyperlipidemia   . Hypertension   . Iron deficiency anemia   . Menorrhagia   . Noncompliance with medications   . Peripheral neuropathy ([place])   . Pneumonia "several times"  . Retinopathy of both eyes   . Seasonal allergies   . Sickle cell trait ([place])   . Type II diabetes mellitus ([place])    uncontrolled    Encounter Details:     CNP Questionnaire - 07/27/16 1853      Patient Demographics   Is this a new or existing patient? Existing   Patient is considered a/an Not Applicable   Race African-American/Black     Patient Assistance   Location of Patient Assistance Not Applicable   Patient's financial/insurance status Self-Pay;[facility]/Care Connects   Uninsured Patient Yes   Interventions Follow-up/Education/Support provided after completed appt.;Counseled to make appt. with provider   Patient referred to apply for the following financial assistance Not Applicable   Food insecurities addressed Not Applicable   Transportation assistance Yes   Type of Assistance Bus Pass Given   Assistance securing medications Yes   Type of Assistance [facility]   Educational health offerings Hypertension;Diabetes;Medications;Behavioral health     Encounter Details   Primary purpose of visit Chronic Illness/Condition Visit;Education/Health Concerns;Spiritual Care/Support Visit;Family/Caregiver Support   Was an Emergency Department visit averted? Not Applicable   Does patient have a medical provider? Yes   Patient referred to Follow up with established PCP   Was a mental health screening completed? (GAINS tool)  No   Does patient have dental issues? No   Does patient have vision issues? Yes   Was a vision referral made? No   Does your patient have an abnormal blood pressure today? Yes   Since previous encounter, have you referred patient for abnormal blood pressure that resulted in a new diagnosis or medication change? No   Does your patient have an abnormal blood glucose today? No   Since previous encounter, have you referred patient for abnormal blood glucose that resulted in a new diagnosis or medication change? No   Was there a life-saving intervention made? No         Amb Nursing Assessment - 07/13/16 0854      Pain Assessment   Pain Score 5    Pain Location Leg   Pain Orientation Left   Pain Radiating Towards goes to foot    Pain Descriptors / Indicators Aching   Pain Onset More than a month ago   Pain Frequency Constant   Effect of Pain on Daily Activities hurts to walk    Client in today  Requesting assistance with  Medications . Nurse will pick up her Plavix  And Megace. Has orthopedic appointment next week, 08-03-16 and will call her PCP regarding her blood  Pressure and see if  her medication needs to be evaluated. Client has not   Wore her compression stocking in about  2 weeks,counseled regarding importance of  Wearing her stocking. States  Her foot is  Better can feel her toes and  move them some up and down .  Average  Blood  Sugar has been 138 mg ,in am's   Around  117 mg. States she  Almost  Got  Bitten by a dog on yesterday  Has phobias since childhood needs to see her   psychiatrist next week  08-04-16  Regarding that experience.Excited  About  Housing  Inspection next week , will help  Her daughter get her granddaughter into head start . Daughter now  Working. Has fell a couple  Of  Times when she gets off balanced. . Nurse was supportive  Wished her well with  Her housing and hopes her foot  Continues to heal and she will know  Next week plans for it.  Follow up  Next week if   Still here.

## 2016-07-28 ENCOUNTER — Other Ambulatory Visit: Payer: Self-pay | Admitting: Oncology

## 2016-07-28 DIAGNOSIS — I2510 Atherosclerotic heart disease of native coronary artery without angina pectoris: Secondary | ICD-10-CM

## 2016-07-28 DIAGNOSIS — Z9861 Coronary angioplasty status: Principal | ICD-10-CM

## 2016-08-02 ENCOUNTER — Encounter (HOSPITAL_COMMUNITY): Payer: Self-pay | Admitting: *Deleted

## 2016-08-02 ENCOUNTER — Inpatient Hospital Stay (HOSPITAL_COMMUNITY)
Admission: AD | Admit: 2016-08-02 | Discharge: 2016-08-02 | Disposition: A | Discharge status: Home / Self Care | Payer: Self-pay | Source: Ambulatory Visit | Attending: Obstetrics and Gynecology | Admitting: Obstetrics and Gynecology

## 2016-08-02 DIAGNOSIS — Z87891 Personal history of nicotine dependence: Secondary | ICD-10-CM | POA: Insufficient documentation

## 2016-08-02 DIAGNOSIS — I10 Essential (primary) hypertension: Secondary | ICD-10-CM | POA: Insufficient documentation

## 2016-08-02 DIAGNOSIS — E119 Type 2 diabetes mellitus without complications: Secondary | ICD-10-CM | POA: Insufficient documentation

## 2016-08-02 DIAGNOSIS — J45909 Unspecified asthma, uncomplicated: Secondary | ICD-10-CM | POA: Insufficient documentation

## 2016-08-02 DIAGNOSIS — Z7984 Long term (current) use of oral hypoglycemic drugs: Secondary | ICD-10-CM | POA: Insufficient documentation

## 2016-08-02 DIAGNOSIS — Z113 Encounter for screening for infections with a predominantly sexual mode of transmission: Secondary | ICD-10-CM | POA: Insufficient documentation

## 2016-08-02 LAB — WET PREP, GENITAL
Clue Cells Wet Prep HPF POC: NONE SEEN
SPERM: NONE SEEN
TRICH WET PREP: NONE SEEN
YEAST WET PREP: NONE SEEN

## 2016-08-02 LAB — POCT PREGNANCY, URINE: PREG TEST UR: NEGATIVE

## 2016-08-02 NOTE — Progress Notes (Signed)
Raynelle Jan NP in earlier to discuss test results and d/c plan. Written and verbal d/c instructions given and understanding voiced.

## 2016-08-02 NOTE — Progress Notes (Signed)
Wet prep obtained by blind swab by RN

## 2016-08-02 NOTE — MAU Note (Signed)
Pt wants STD testing, has had same partner for the last 2 months, states her partner has other sexual partners.  No pain or discharge today.

## 2016-08-02 NOTE — MAU Note (Signed)
Urine in lab 

## 2016-08-02 NOTE — MAU Provider Note (Signed)
History     CSN: [id number]  Arrival date and time: 08/02/16 1418   None     Chief Complaint  Patient presents with  . STD testing   HPI Pt is not pregnant 58 [id number] wanting STD testing.  Pt had extensive STD testing on 06/18/2016, which was negative.  Pt states her partner sees someone else.  Pt denies any symptoms of vaginal discharge, itching or burning or abdominal pain. Pt has multiple health issues and sees IM- including diabetes and uncontrolled hypertension. Pt is currently at a shelter. 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 RN note: MAU Note Date of Service: 08/02/2016 2:44 PM Rolene Course, RN    [] Hide copied text [] Hover for attribution information Pt wants STD testing, has had same partner for the last 2 months, states her partner has other sexual partners.  No pain or discharge today.       Past Medical History:  Diagnosis Date  . Asthma   . CAD S/P BMS PCI to prox LAD 03/31/2016   Ost LAD to Mid LAD lesion, 90% stenosed. Post intervention - Vision BMS 3.0 mm x 18 mm (~3.5 mm) there is a 0% residual stenosis.   . Depression   . Diabetic retinopathy ([place])   . Heart murmur   . Hyperlipidemia   . Hypertension   . Iron deficiency anemia   . Menorrhagia   . Noncompliance with medications   . Peripheral neuropathy ([place])   . Pneumonia "several times"  . Retinopathy of both eyes   . Seasonal allergies   . Sickle cell trait ([place])   . Type II diabetes mellitus ([place])    uncontrolled    Past Surgical History:  Procedure Laterality Date  . CARDIAC CATHETERIZATION N/A 03/31/2016   Procedure: Left Heart Cath and Coronary Angiography;  Surgeon: Lorretta Harp, MD;  Location: [facility];  Service: Cardiovascular;  Laterality: N/A;  . CARDIAC CATHETERIZATION N/A 03/31/2016   Procedure: Coronary Stent Intervention;  Surgeon: Lorretta Harp, MD;  Location: [place] [facility];  Service: Cardiovascular;  Laterality: N/A;  .  [place]  . CORONARY ANGIOPLASTY WITH STENT PLACEMENT  03/2016  . TUBAL LIGATION  1997    Family History  Problem Relation Age of Onset  . Stroke Mother   . Diabetes Mother   . Hypertension Mother   . Aneurysm Mother   . Diabetes Father   . Hypertension Father   . Diabetes Sister   . Hypertension Sister   . Diabetes Maternal Grandmother   . Diabetes Maternal Grandfather   . Cancer Paternal Grandfather     Prostate    Social History  Substance Use Topics  . Smoking status: Former Smoker    Packs/day: 0.25    Years: 1.00    Types: Cigarettes    Quit date: 08/29/2015  . Smokeless tobacco: Never Used  . Alcohol use No     Comment: 12/23/2015 "last alcohol was ~ Christmas 2016; that was very little alcohol"    Allergies: No Known Allergies  Prescriptions Prior to Admission  Medication Sig Dispense Refill Last Dose  . albuterol (PROVENTIL HFA;VENTOLIN HFA) 108 (90 Base) MCG/ACT inhaler Inhale 2 puffs into the lungs every 4 (four) hours as needed for wheezing or shortness of breath (or coughing). 1 Inhaler 2 Taking  . ASPIR-LOW 81 MG EC tablet TAKE 1 Tablet BY MOUTH ONCE DAILY 90 tablet 2   . atorvastatin (LIPITOR)  40 MG tablet TAKE 1 Tablet BY MOUTH EVERY EVENING AT 6 PM 90 tablet 0   . clopidogrel (PLAVIX) 75 MG tablet Take 1 tablet (75 mg total) by mouth daily. Internal Medicine Program 30 tablet 1   . ferrous sulfate 325 (65 FE) MG tablet Take 1 tablet (325 mg total) by mouth daily with breakfast. (Patient taking differently: Take 325 mg by mouth 3 (three) times daily with meals. ) 90 tablet 0 Taking  . HUMULIN 70/30 (70-30) 100 UNIT/ML injection INJECT 18 UNITS UNDER THE SKIN TWICE DAILY WITH MEALS 10 mL 1 Taking  . loperamide (IMODIUM) 2 MG capsule Take 1 capsule (2 mg total) by mouth 4 (four) times daily as needed for diarrhea or loose stools. 12 capsule 0   . megestrol (MEGACE) 40 MG tablet Take 1 tablet (40 mg total) by mouth daily. Internal Medicine Program  60 tablet 1   . Megestrol Acetate (MEGACE ES PO) Take 1 tablet by mouth 2 (two) times daily. Patient states she takes an unknown dose of this medication twice daily last dose 03/12/2016   Taking  . metFORMIN (GLUCOPHAGE) 500 MG tablet Take 1 tablet (500 mg total) by mouth 2 (two) times daily with a meal. 180 tablet 0 Taking  . metoprolol (LOPRESSOR) 50 MG tablet TAKE 1 Tablet  BY MOUTH TWICE DAILY 180 tablet 0     Review of Systems  Constitutional: Negative for chills and fever.  Gastrointestinal: Negative for abdominal pain, nausea and vomiting.  Genitourinary: Negative for dysuria.   Physical Exam   Blood pressure 159/93, pulse 88, temperature 98.2 F (36.8 C), temperature source Oral, resp. rate 18.  Physical Exam  Nursing note and vitals reviewed. Constitutional: She is oriented to person, place, and time. She appears well-developed and well-nourished. No distress.  HENT:  Head: Normocephalic.  Eyes: Pupils are equal, round, and reactive to light.  Neck: Normal range of motion. Neck supple.  Cardiovascular: Normal rate.   Respiratory: Effort normal.  GI: Soft.  Musculoskeletal:  Boot on left foot/leg due to injury  Neurological: She is alert and oriented to person, place, and time.  Skin: Skin is warm and dry.  Psychiatric: She has a normal mood and affect.    MAU Course  Procedures Results for orders placed or performed during the hospital encounter of 08/02/16 (from the past 24 hour(s))  Pregnancy, urine POC     Status: None   Collection Time: 08/02/16  2:56 PM  Result Value Ref Range   Preg Test, Ur NEGATIVE NEGATIVE  Wet prep, genital     Status: Abnormal   Collection Time: 08/02/16  4:15 PM  Result Value Ref Range   Yeast Wet Prep HPF POC NONE SEEN NONE SEEN   Trich, Wet Prep NONE SEEN NONE SEEN   Clue Cells Wet Prep HPF POC NONE SEEN NONE SEEN   WBC, Wet Prep HPF POC FEW (A) NONE SEEN   Sperm NONE SEEN   GC/chlamydia, RPR, HIV neg on 06/18/2016  Assessment and  Plan  STD testing Safe sex discussed F/u with Dr. Ihor Dow Can have retesting with f/u appointment with IM or [place] 08/02/2016, 3:57 PM

## 2016-08-03 ENCOUNTER — Other Ambulatory Visit: Payer: Self-pay | Admitting: Oncology

## 2016-08-03 DIAGNOSIS — Z794 Long term (current) use of insulin: Principal | ICD-10-CM

## 2016-08-03 DIAGNOSIS — E113493 Type 2 diabetes mellitus with severe nonproliferative diabetic retinopathy without macular edema, bilateral: Secondary | ICD-10-CM

## 2016-08-03 DIAGNOSIS — E0800 Diabetes mellitus due to underlying condition with hyperosmolarity without nonketotic hyperglycemic-hyperosmolar coma (NKHHC): Secondary | ICD-10-CM

## 2016-08-03 DIAGNOSIS — I10 Essential (primary) hypertension: Secondary | ICD-10-CM

## 2016-08-03 DIAGNOSIS — M7989 Other specified soft tissue disorders: Secondary | ICD-10-CM

## 2016-08-04 ENCOUNTER — Ambulatory Visit: Payer: Self-pay | Admitting: Pharmacist

## 2016-08-04 ENCOUNTER — Other Ambulatory Visit: Payer: Self-pay | Admitting: Oncology

## 2016-08-04 ENCOUNTER — Encounter: Payer: Self-pay | Admitting: Internal Medicine

## 2016-08-04 ENCOUNTER — Ambulatory Visit (INDEPENDENT_AMBULATORY_CARE_PROVIDER_SITE_OTHER): Payer: Self-pay | Admitting: Internal Medicine

## 2016-08-04 VITALS — BP 166/88 | HR 85 | Temp 98.3°F | Ht 65.0 in | Wt 222.2 lb

## 2016-08-04 DIAGNOSIS — Z79899 Other long term (current) drug therapy: Secondary | ICD-10-CM

## 2016-08-04 DIAGNOSIS — I10 Essential (primary) hypertension: Secondary | ICD-10-CM

## 2016-08-04 DIAGNOSIS — Z87891 Personal history of nicotine dependence: Secondary | ICD-10-CM

## 2016-08-04 DIAGNOSIS — D509 Iron deficiency anemia, unspecified: Secondary | ICD-10-CM

## 2016-08-04 DIAGNOSIS — Z590 Homelessness: Secondary | ICD-10-CM

## 2016-08-04 DIAGNOSIS — Z794 Long term (current) use of insulin: Secondary | ICD-10-CM

## 2016-08-04 DIAGNOSIS — E113493 Type 2 diabetes mellitus with severe nonproliferative diabetic retinopathy without macular edema, bilateral: Secondary | ICD-10-CM

## 2016-08-04 DIAGNOSIS — Z598 Other problems related to housing and economic circumstances: Secondary | ICD-10-CM

## 2016-08-04 DIAGNOSIS — Z23 Encounter for immunization: Secondary | ICD-10-CM

## 2016-08-04 LAB — GLUCOSE, CAPILLARY: Glucose-Capillary: 81 mg/dL (ref 65–99)

## 2016-08-04 LAB — POCT GLYCOSYLATED HEMOGLOBIN (HGB A1C): Hemoglobin A1C: 7.5

## 2016-08-04 MED ORDER — LISINOPRIL 10 MG PO TABS: 10.0000 mg | ORAL_TABLET | Freq: Every day | ORAL | 3 refills | Status: DC

## 2016-08-04 MED ORDER — METFORMIN HCL 500 MG PO TABS: 500.0000 mg | ORAL_TABLET | Freq: Three times a day (TID) | ORAL | 1 refills | Status: DC

## 2016-08-04 NOTE — Assessment & Plan Note (Addendum)
166/88 initial, 185/101 on recheck - only taking Metoprolol 50mg  daily. Was previously taking Lisinopril-HCTZ 20-25 combo pill and had significant dizziness and orthostatic hypotension and this medication was discontinued due to sufficient BP control on Metoprolol earlier this year. Now uncontrolled, persistent elevation into SBP 180s DBP 100-110s on several recent visits. Has T2DM, documented microalbuminuria 40 in 2015 and this likely has only worsened. DM control significantly improving with A1c 7.5 from 9.2 four months ago.   Plan: - Add Lisinopril 10 mg daily, sample provided via Dr. [name] as patient would not receive medication for several days as she gets them shipped/delivered via [facility] - Check urine microalbumin at next visit

## 2016-08-04 NOTE — Assessment & Plan Note (Signed)
Foot exam completed today Glucose control significant improved, A1c 7.5 from 9.2 in 03/2016, endorses ongoing poor diet due to financial situation living in homeless shelter cannot control diet. Tolerating current metformin and insulin regimen, reports GI side effects with 1000 mg metformin in past.  Plan: Increase metformin to 500 mg 3 times a day with meals Continue Humulin 70/30, 18 units twice a day with meals Encouraged diet and exercise

## 2016-08-04 NOTE — Progress Notes (Signed)
Medication Samples have been provided to the patient.  Drug: lisinopril Strength: 10 mg Qty: 7 LOT: [id number] Exp.Date: 10/18 Dosing instructions: 1 tablet every evening (patient states she takes atorvastatin at night, advised to take with lisinopril)  The patient has been instructed regarding the correct time, dose, and frequency of taking this medication, including desired effects and most common side effects.   Kim,Jennifer J 2:59 PM 08/04/2016

## 2016-08-04 NOTE — Patient Instructions (Addendum)
Please take metformin, 500 mg, 1 tablet 3 times daily with meals  For your high blood pressure - we have prescribed lisinopril 10 mg daily   For aches and pains you can take Tylenol - no more than 3000 mg daily (no more than six 500 mg extra strength Tylenol tablets)  Congratulations on all the positive changes to your health and and progress you have made - keep up the good work!

## 2016-08-04 NOTE — Progress Notes (Signed)
Initiating lisinopril for hypertension  Medication Samples have been provided to the patient.  Drug: lisinopril Strength: 10 mg Qty: 7 LOT: [id number] Exp.Date: 10/18 Dosing instructions: 1 tablet every evening (patient states she takes atorvastatin at night, advised to take with lisinopril)  The patient has been instructed regarding the correct time, dose, and frequency of taking this medication, including desired effects and most common side effects.   Kim,Jennifer J 2:59 PM 08/04/2016

## 2016-08-04 NOTE — Progress Notes (Signed)
   CC: Hypertension  HPI:  [name] is a 40 y.o. female with PMHx detailed below presenting for hypertension and requesting an HbA1c for upcoming orthopedic surgery on her left foot.  See problem based assessment and plan below for additional details.  Past Medical History:  Diagnosis Date  . Asthma   . CAD S/P BMS PCI to prox LAD 03/31/2016   Ost LAD to Mid LAD lesion, 90% stenosed. Post intervention - Vision BMS 3.0 mm x 18 mm (~3.5 mm) there is a 0% residual stenosis.   . Depression   . Diabetic retinopathy ([place])   . Heart murmur   . Hyperlipidemia   . Hypertension   . Iron deficiency anemia   . Menorrhagia   . Noncompliance with medications   . Peripheral neuropathy ([place])   . Pneumonia "several times"  . Retinopathy of both eyes   . Seasonal allergies   . Sickle cell trait ([place])   . Type II diabetes mellitus ([facility])    uncontrolled    Review of Systems: ROS   Physical Exam: Vitals:   08/04/16 1343  BP: (!) 166/88  Pulse: 85  Temp: 98.3 F (36.8 C)  TempSrc: Oral  SpO2: 100%  Weight: 222 lb 3.2 oz (100.8 kg)  Height: 5\' 5"  (1.651 m)   Body mass index is 36.98 kg/m. GENERAL- Obese female sitting comfortably in exam room chair, alert, in no distress, soft cast/boot on left foot HEENT- Atraumatic, EOMI, moist mucous membranes CARDIAC- Regular rate and rhythm, no murmurs, rubs or gallops. RESP- Clear to ascultation bilaterally, no wheezing or crackles, normal work of breathing ABDOMEN- Normoactive bowel sounds, soft, nontender, nondistende EXTREMITIES- Normal bulk and range of motion, no edema, 1+ peripheral pulses SKIN- Warm, dry, intact, without visible rash PSYCH- Appropriate affect, clear speech, thoughts linear and goal-directed  Assessment & Plan:   See encounters tab for problem based medical decision making.  Patient seen with Dr. Dareen Piano

## 2016-08-09 DIAGNOSIS — I10 Essential (primary) hypertension: Secondary | ICD-10-CM

## 2016-08-09 DIAGNOSIS — E0800 Diabetes mellitus due to underlying condition with hyperosmolarity without nonketotic hyperglycemic-hyperosmolar coma (NKHHC): Secondary | ICD-10-CM

## 2016-08-10 NOTE — Progress Notes (Signed)
Internal Medicine Clinic Attending  I saw and evaluated the patient.  I personally confirmed the key portions of the history and exam documented by Dr. Johnson and I reviewed pertinent patient test results.  The assessment, diagnosis, and plan were formulated together and I agree with the documentation in the resident's note.  

## 2016-08-15 DIAGNOSIS — E0800 Diabetes mellitus due to underlying condition with hyperosmolarity without nonketotic hyperglycemic-hyperosmolar coma (NKHHC): Secondary | ICD-10-CM

## 2016-08-15 DIAGNOSIS — I10 Essential (primary) hypertension: Secondary | ICD-10-CM

## 2016-08-15 DIAGNOSIS — M7989 Other specified soft tissue disorders: Secondary | ICD-10-CM

## 2016-08-16 ENCOUNTER — Encounter: Payer: Self-pay | Admitting: *Deleted

## 2016-08-16 NOTE — Congregational Nurse Program (Signed)
Congregational Nurse Program Note  Date of Encounter: 08/15/2016  Past Medical History: Past Medical History:  Diagnosis Date  . Asthma   . CAD S/P BMS PCI to prox LAD 03/31/2016   Ost LAD to Mid LAD lesion, 90% stenosed. Post intervention - Vision BMS 3.0 mm x 18 mm (~3.5 mm) there is a 0% residual stenosis.   . Depression   . Diabetic retinopathy ([place])   . Heart murmur   . Hyperlipidemia   . Hypertension   . Iron deficiency anemia   . Menorrhagia   . Noncompliance with medications   . Peripheral neuropathy ([place])   . Pneumonia "several times"  . Retinopathy of both eyes   . Seasonal allergies   . Sickle cell trait ([place])   . Type II diabetes mellitus ([place])    uncontrolled    Encounter Details:     CNP Questionnaire - 08/16/16 0222      Patient Demographics   Is this a new or existing patient? Existing   Patient is considered a/an Not Applicable   Race African-American/Black     Patient Assistance   Location of Patient Assistance Not Applicable   Patient's financial/insurance status Self-Pay;[facility]/Care Connects   Uninsured Patient Yes   Interventions Counseled to make appt. with provider;Averted from ED/Urgent Care   Patient referred to apply for the following financial assistance [place] insecurities addressed Not Applicable   Transportation assistance No   Assistance securing medications No   Educational health offerings Chronic disease;Hypertension;Diabetes;Nutrition;Safety     Encounter Details   Primary purpose of visit Spiritual Care/Support Visit;Chronic Illness/Condition Visit;Education/Health Concerns;Safety   Was an Emergency Department visit averted? Not Applicable   Does patient have a medical provider? Yes   Patient referred to Follow up with established PCP   Was a mental health screening completed? (GAINS tool) No   Does patient have dental issues? No   Does patient have vision issues? Yes   Was a vision referral  made? No   Does your patient have an abnormal blood pressure today? Yes   Since previous encounter, have you referred patient for abnormal blood pressure that resulted in a new diagnosis or medication change? Yes   Does your patient have an abnormal blood glucose today? Yes   Since previous encounter, have you referred patient for abnormal blood glucose that resulted in a new diagnosis or medication change? Yes   Was there a life-saving intervention made? No         Amb Nursing Assessment - 08/04/16 1346      Pain Assessment   Pain Assessment 0-10   Pain Score 7    Pain Type Acute pain   Pain Location Head   Pain Orientation Left   Pain Descriptors / Indicators Pressure   Pain Onset In the past 7 days   Pain Frequency Intermittent     Nutrition Screen   BMI - recorded 36.98   Nutritional Status BMI > 30  Obese   Nutritional Risks None   Diabetes Yes     Functional Status   Activities of Daily Living Independent   Ambulation Independent with device- listed below   Home Assistive Devices/Equipment Other (Comment)  KNEE SCOOTER   Medication Administration Independent   Home Management Independent     Risk/Barriers  Assessment   Barriers to Care Management & Learning None     Abuse/Neglect Assessment   Do you feel unsafe in your current relationship? No  Do you feel physically threatened by others? No   Anyone hurting you at home, work, or school? No   Unable to ask? No     Patient Literacy   How often do you need to have someone help you when you read instructions, pamphlets, or other written materials from your doctor or pharmacy? 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 No     Comments   Information entered by : Lucky Rathke NTII 9-21-017   1:48PM     Client has  Been  Discharged from [facility] but  Nurse was concerned about her  High  Blood pressure ,so she called the  client . Client has not  Followed through with calling  For earlier appointment but has an appointment 10-24 she states . Blood  Pressure has come down somewhat , counseled again regarding diabetes and blood pressure , blood  Sugar was 175 mg  @ 7 :06 pm Which was okay  For her ,as client admits she hasnt been eating very wisely . Nurse encouraged her  To get control of her health as she understands what  She  Needs to  Do . States she just left [facility] and was in and argument and was ask to  Leave the premises . Client was upset ,nurse allowed her to talk . States her metformin was   increased to 1500 mg per day and that she is taking her blood  Pressure medications  correctly now . Informed client she can keep  A check on her  Blood  Pressure at the closest  Fire station or  Return to  [facility]  For me to check it  For her.  Client states they will soon do surgery on her foot and the  Nurse again encouraged her to  Take  Better care of herself because of her diabetes and high blood  Pressure. Client understands the importance but needs to act on it. Follow as needed .

## 2016-08-16 NOTE — Congregational Nurse Program (Signed)
Congregational Nurse Program Note  Date of Encounter: 08/03/2016  Past Medical History: Past Medical History:  Diagnosis Date  . Asthma   . CAD S/P BMS PCI to prox LAD 03/31/2016   Ost LAD to Mid LAD lesion, 90% stenosed. Post intervention - Vision BMS 3.0 mm x 18 mm (~3.5 mm) there is a 0% residual stenosis.   . Depression   . Diabetic retinopathy ([place])   . Heart murmur   . Hyperlipidemia   . Hypertension   . Iron deficiency anemia   . Menorrhagia   . Noncompliance with medications   . Peripheral neuropathy ([place])   . Pneumonia "several times"  . Retinopathy of both eyes   . Seasonal allergies   . Sickle cell trait ([place])   . Type II diabetes mellitus ([place])    uncontrolled    Encounter Details:     CNP Questionnaire - 08/16/16 0153      Patient Demographics   Patient is considered a/an Not Applicable   Race African-American/Black     Patient Assistance   Location of Patient Assistance Not Applicable   Patient's financial/insurance status Self-Pay;[place] [employer]/Care Connects   Uninsured Patient Yes   Interventions Counseled to make appt. with provider   Patient referred to apply for the following financial assistance [place] insecurities addressed Not Applicable   Transportation assistance No   Assistance securing medications No   Educational health offerings Chronic disease;Diabetes;Hypertension;Nutrition     Encounter Details   Primary purpose of visit Chronic Illness/Condition Visit;Education/Health Concerns;Spiritual Care/Support Visit   Does patient have a medical provider? Yes   Patient referred to Follow up with established PCP   Was a mental health screening completed? (GAINS tool) No   Does patient have dental issues? No   Does patient have vision issues? Yes   Was a vision referral made? No   Does your patient have an abnormal blood pressure today? Yes   Since previous encounter, have you referred patient for abnormal blood  pressure that resulted in a new diagnosis or medication change? No   Does your patient have an abnormal blood glucose today? Yes   Since previous encounter, have you referred patient for abnormal blood glucose that resulted in a new diagnosis or medication change? No   Was there a life-saving intervention made? No         Amb Nursing Assessment - 08/04/16 1346      Pain Assessment   Pain Assessment 0-10   Pain Score 7    Pain Type Acute pain   Pain Location Head   Pain Orientation Left   Pain Descriptors / Indicators Pressure   Pain Onset In the past 7 days   Pain Frequency Intermittent     Nutrition Screen   BMI - recorded 36.98   Nutritional Status BMI > 30  Obese   Nutritional Risks None   Diabetes Yes     Functional Status   Activities of Daily Living Independent   Ambulation Independent with device- listed below   Home Assistive Devices/Equipment Other (Comment)  KNEE SCOOTER   Medication Administration Independent   Home Management Independent     Risk/Barriers  Assessment   Barriers to Care Management & Learning None     Abuse/Neglect Assessment   Do you feel unsafe in your current relationship? No   Do you feel physically threatened by others? No   Anyone hurting you at home, work, or school? No   Unable to  ask? No     Patient Literacy   How often do you need to have someone help you when you read instructions, pamphlets, or other written materials from your doctor or pharmacy? 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 No     Comments   Information entered by : Lucky Rathke NTII 9-21-017   1:48PM     Counseled  Regarding high blood  Pressure and taking of  Medications. Client was seen by orthopedist states her  A1 _C was done  Today blood sugars still running  Up and  Down   States it was 192 mg . Client was encouraged to call for appointment to see her Internal   Medicine  MD  Regarding  Blood  Pressure and blood  Sugar. Client agrees . Will follow up  Next  Week

## 2016-08-16 NOTE — Congregational Nurse Program (Signed)
Congregational Nurse Program Note  Date of Encounter: 08/09/2016  Past Medical History: Past Medical History:  Diagnosis Date  . Asthma   . CAD S/P BMS PCI to prox LAD 03/31/2016   Ost LAD to Mid LAD lesion, 90% stenosed. Post intervention - Vision BMS 3.0 mm x 18 mm (~3.5 mm) there is a 0% residual stenosis.   . Depression   . Diabetic retinopathy ([place])   . Heart murmur   . Hyperlipidemia   . Hypertension   . Iron deficiency anemia   . Menorrhagia   . Noncompliance with medications   . Peripheral neuropathy ([place])   . Pneumonia "several times"  . Retinopathy of both eyes   . Seasonal allergies   . Sickle cell trait ([place])   . Type II diabetes mellitus ([place])    uncontrolled    Encounter Details:     CNP Questionnaire - 08/16/16 0205      Patient Demographics   Is this a new or existing patient? Existing   Patient is considered a/an Not Applicable   Race African-American/Black     Patient Assistance   Location of Patient Assistance Not Applicable   Patient's financial/insurance status Self-Pay;[place] Card/Care Connects   Uninsured Patient Yes   Interventions Referred to ED/Urgent Care;Counseled to make appt. with provider   Patient referred to apply for the following financial assistance [place] insecurities addressed Not Applicable   Transportation assistance No   Assistance securing medications No   Educational health offerings Diabetes;Chronic disease;Hypertension     Encounter Details   Primary purpose of visit Navigating the Healthcare System;Education/Health Concerns;Safety;Chronic Illness/Condition Visit   Was an Emergency Department visit averted? No   Does patient have a medical provider? Yes   Patient referred to Follow up with established PCP;Emergency Department   Was a mental health screening completed? (GAINS tool) No   Does patient have dental issues? No   Does patient have vision issues? Yes   Was a vision referral made? No    Does your patient have an abnormal blood pressure today? Yes   Since previous encounter, have you referred patient for abnormal blood pressure that resulted in a new diagnosis or medication change? No   Does your patient have an abnormal blood glucose today? Yes   Since previous encounter, have you referred patient for abnormal blood glucose that resulted in a new diagnosis or medication change? No   Was there a life-saving intervention made? Yes         Amb Nursing Assessment - 08/04/16 1346      Pain Assessment   Pain Assessment 0-10   Pain Score 7    Pain Type Acute pain   Pain Location Head   Pain Orientation Left   Pain Descriptors / Indicators Pressure   Pain Onset In the past 7 days   Pain Frequency Intermittent     Nutrition Screen   BMI - recorded 36.98   Nutritional Status BMI > 30  Obese   Nutritional Risks None   Diabetes Yes     Functional Status   Activities of Daily Living Independent   Ambulation Independent with device- listed below   Home Assistive Devices/Equipment Other (Comment)  KNEE SCOOTER   Medication Administration Independent   Home Management Independent     Risk/Barriers  Assessment   Barriers to Care Management & Learning None     Abuse/Neglect Assessment   Do you feel unsafe in your current relationship? No  Do you feel physically threatened by others? No   Anyone hurting you at home, work, or school? No   Unable to ask? No     Patient Literacy   How often do you need to have someone help you when you read instructions, pamphlets, or other written materials from your doctor or pharmacy? 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 No     Comments   Information entered by : Lucky Rathke NTII 9-21-017   1:48PM    Client in today  and will be  Discharged from the [facility]  to  her own apartment  on tomorrow .Marland Kitchen Nurse counseled her regarding her  severe high blood  pressure along with her other chronic  Conditions and encouraged her  strongly  To seek medical care if her condition became worst as SOB ,chest pains. Client states she was out of  One of her blood pressure pills and would  Be getting it tomorrow .  When reviewed with her her blood pressure medications found out she was not  taking it correctly. She was and had  not been  taking  her  metoprolol 50 mg only once aily.  Client will start taking correctly  And  Return to see the nurse on tomorrow and call for an appointment to  Be seen . Referral made and  Given to client  Encouraging her to take better care of  Self. . Reports that her blood  Sugars drop low  And then also  Go high

## 2016-08-22 ENCOUNTER — Telehealth: Payer: Self-pay | Admitting: *Deleted

## 2016-08-22 NOTE — Telephone Encounter (Signed)
forward Dr [name] for review  Patient last was with L. Kilroy PA. Patient is not due for follow up until Dec 2017.   Request for surgical clearance:  1. What type of surgery is being performed? LEFT ACHILLES TENDON LENGTHENING(OPEN);LEFT MIDFOOT OSTEOTOMY AND FUSION  2. When is this surgery scheduled? TBA  3. Are there any medications that need to be held prior to surgery and how long? PLAVIX ,ASPIRIN  4. Name of physician performing surgery? DR Jenny Reichmann HEWITT  5. What is your office phone and fax number? PHONE [phone]    ATTN: [place]  Fax [phone]

## 2016-08-23 NOTE — Telephone Encounter (Signed)
I have not seen her since her cath -PCI in May 2017.  Usually prefer to maintain ASA/Plavix x 1 yr following PCI unless surgery is urgent.  Cannot comment on this unless she is seen in clinic by MD or APP./   Glenetta Hew, MD

## 2016-08-24 NOTE — Telephone Encounter (Signed)
Attempt to contact patient . Unable to leave message at home or temporary number not in service.patient needs and appointment for possible clearance.   will forward information to Orthopaedic's office.

## 2016-08-25 MED FILL — CLOPIDOGREL 75 MG TABLET: 75 | 30 days supply | Qty: 30 | Fill #3

## 2016-08-25 MED FILL — MEGESTROL 40 MG TABLET: 40 | 30 days supply | Qty: 60 | Fill #3

## 2016-09-08 ENCOUNTER — Encounter: Payer: Self-pay | Admitting: Internal Medicine

## 2016-09-13 ENCOUNTER — Other Ambulatory Visit: Payer: Self-pay | Admitting: Internal Medicine

## 2016-09-13 ENCOUNTER — Other Ambulatory Visit: Payer: Self-pay | Admitting: Oncology

## 2016-09-13 DIAGNOSIS — E113493 Type 2 diabetes mellitus with severe nonproliferative diabetic retinopathy without macular edema, bilateral: Secondary | ICD-10-CM

## 2016-09-13 DIAGNOSIS — J45909 Unspecified asthma, uncomplicated: Secondary | ICD-10-CM

## 2016-09-13 DIAGNOSIS — Z794 Long term (current) use of insulin: Principal | ICD-10-CM

## 2016-09-26 MED FILL — CLOPIDOGREL 75 MG TABLET: 75 | 30 days supply | Qty: 30 | Fill #4

## 2016-09-27 ENCOUNTER — Other Ambulatory Visit: Payer: Self-pay | Admitting: *Deleted

## 2016-09-27 DIAGNOSIS — Z79899 Other long term (current) drug therapy: Secondary | ICD-10-CM

## 2016-09-28 ENCOUNTER — Telehealth: Payer: Self-pay | Admitting: Cardiovascular Disease

## 2016-09-28 NOTE — Telephone Encounter (Signed)
This is a patient of Dr. Allison Quarry.

## 2016-09-28 NOTE — Telephone Encounter (Signed)
Left message on voicemail- patient needs an appointment for clearance by extender or Dr [name].

## 2016-09-28 NOTE — Telephone Encounter (Signed)
Requesting surgical clearance:  1. Type of surgery: Left Achilles Tendon Lengthening; Left midfoot osteotomy and fusion  2. Surgeon: Wylene Simmer, MD  3.Surgical Date: Pending Cardiac Clearance  4. Medications that need to be held: Plavix, 7 days prior to procedure per pharmacy   5. CAD: Yes  6. I will defer to:  Dr. Pearla Dubonnet Information:   [facility] Surgical Scheduling Fabio Asa Phone: [phone] Fax: [phone]

## 2016-09-29 ENCOUNTER — Telehealth: Payer: Self-pay | Admitting: Internal Medicine

## 2016-09-29 NOTE — Telephone Encounter (Signed)
lisinopril (PRINIVIL,ZESTRIL) 10 MG tablet  pt asking for refill states pharmacy says its too early. She said she only got 76 last time [facility]

## 2016-09-29 NOTE — Telephone Encounter (Signed)
I had to see her since I saw her in the hospital and she underwent cardiac catheterization. She saw Lurena Joiner back in May.  I would imagine this is a relatively low risk surgery, but she was due to be seen in December anyway. Provided she can be seen by either myself or Lurena Joiner, I'm sure that if she is not having any angina symptoms should be fine for surgery and obtain a stop Lasix because she had a bare-metal stent. Unfortunately, her last visit was over 5 months ago and I would prefer for her to be seen prior to that.   Glenetta Hew, MD

## 2016-09-30 NOTE — Telephone Encounter (Signed)
Pharmacy informed.

## 2016-09-30 NOTE — Telephone Encounter (Signed)
Spoke to pt. She said she called yesterday and is scheduled with Dr. [name] on 10/17/16 for clearance.

## 2016-09-30 NOTE — Telephone Encounter (Signed)
Have spoken to pt 11/16 and 11/17, she will call back if needed

## 2016-10-04 ENCOUNTER — Telehealth: Payer: Self-pay | Admitting: *Deleted

## 2016-10-04 ENCOUNTER — Other Ambulatory Visit: Payer: Self-pay | Admitting: Internal Medicine

## 2016-10-04 DIAGNOSIS — E113493 Type 2 diabetes mellitus with severe nonproliferative diabetic retinopathy without macular edema, bilateral: Secondary | ICD-10-CM

## 2016-10-04 DIAGNOSIS — Z794 Long term (current) use of insulin: Principal | ICD-10-CM

## 2016-10-04 DIAGNOSIS — Z9861 Coronary angioplasty status: Secondary | ICD-10-CM

## 2016-10-04 DIAGNOSIS — I2510 Atherosclerotic heart disease of native coronary artery without angina pectoris: Secondary | ICD-10-CM

## 2016-10-04 DIAGNOSIS — D509 Iron deficiency anemia, unspecified: Secondary | ICD-10-CM

## 2016-10-04 NOTE — Telephone Encounter (Signed)
Pt called and left message requesting refill of megace. She stated that she is taking 40 mg once daily. She would like the prescription sent to [place].

## 2016-10-05 ENCOUNTER — Other Ambulatory Visit: Payer: Self-pay | Admitting: Obstetrics & Gynecology

## 2016-10-05 DIAGNOSIS — Z79899 Other long term (current) drug therapy: Secondary | ICD-10-CM

## 2016-10-05 MED ORDER — MEGESTROL ACETATE 40 MG PO TABS: 40.0000 mg | ORAL_TABLET | Freq: Every day | ORAL | 3 refills | Status: DC

## 2016-10-05 MED FILL — MEGESTROL 40 MG TABLET: 40 | 30 days supply | Qty: 30 | Fill #0

## 2016-10-05 NOTE — Progress Notes (Signed)
Patient has been informed of megace sent to [place]

## 2016-10-12 ENCOUNTER — Encounter: Payer: Self-pay | Admitting: Internal Medicine

## 2016-10-12 ENCOUNTER — Ambulatory Visit (INDEPENDENT_AMBULATORY_CARE_PROVIDER_SITE_OTHER): Payer: Medicaid Other | Admitting: Internal Medicine

## 2016-10-12 VITALS — BP 145/80 | HR 83 | Temp 98.5°F | Wt 206.8 lb

## 2016-10-12 DIAGNOSIS — E11610 Type 2 diabetes mellitus with diabetic neuropathic arthropathy: Secondary | ICD-10-CM

## 2016-10-12 DIAGNOSIS — B9789 Other viral agents as the cause of diseases classified elsewhere: Secondary | ICD-10-CM

## 2016-10-12 DIAGNOSIS — Z87891 Personal history of nicotine dependence: Secondary | ICD-10-CM

## 2016-10-12 DIAGNOSIS — Z79899 Other long term (current) drug therapy: Secondary | ICD-10-CM | POA: Diagnosis not present

## 2016-10-12 DIAGNOSIS — Z794 Long term (current) use of insulin: Secondary | ICD-10-CM

## 2016-10-12 DIAGNOSIS — I10 Essential (primary) hypertension: Secondary | ICD-10-CM | POA: Diagnosis not present

## 2016-10-12 DIAGNOSIS — N938 Other specified abnormal uterine and vaginal bleeding: Secondary | ICD-10-CM | POA: Diagnosis not present

## 2016-10-12 DIAGNOSIS — E113493 Type 2 diabetes mellitus with severe nonproliferative diabetic retinopathy without macular edema, bilateral: Secondary | ICD-10-CM

## 2016-10-12 DIAGNOSIS — J069 Acute upper respiratory infection, unspecified: Secondary | ICD-10-CM

## 2016-10-12 MED ORDER — LISINOPRIL 20 MG PO TABS: 20.0000 mg | ORAL_TABLET | Freq: Every day | ORAL | 3 refills | Status: DC

## 2016-10-12 MED ORDER — METFORMIN HCL 500 MG PO TABS: 1000.0000 mg | ORAL_TABLET | Freq: Two times a day (BID) | ORAL | 1 refills | Status: DC

## 2016-10-12 MED ORDER — CETIRIZINE HCL 10 MG PO TABS: 10.0000 mg | ORAL_TABLET | Freq: Every day | ORAL | 0 refills | Status: DC

## 2016-10-12 NOTE — Progress Notes (Signed)
   CC: hypertension  HPI:  Ms.Ayauna L Tech is a 40 y.o. with a PMH of T2DM, Charcot foot, HTN, and diabetic retinopathy presenting to clinic for follow up on her DM and hypertension.   Plan for 4 week f/u visit in Dec to address HTN (increased Lisinopril to 20mg  daily) and DM (inc metformin to 1000mg  daily, on novolog mix 70/30 18U BID; asked to bring in meter).  --needs BMP checked (had mild increase in Cr this visit since lisinopril added) --needs A1c checked  Please see problem based Assessment and Plan for status of patients chronic conditions.  Past Medical History:  Diagnosis Date  . Asthma   . CAD S/P BMS PCI to prox LAD 03/31/2016   Ost LAD to Mid LAD lesion, 90% stenosed. Post intervention - Vision BMS 3.0 mm x 18 mm (~3.5 mm) there is a 0% residual stenosis.   . Depression   . Diabetic retinopathy ([place])   . Heart murmur   . Hyperlipidemia   . Hypertension   . Iron deficiency anemia   . Menorrhagia   . Noncompliance with medications   . Peripheral neuropathy ([place])   . Pneumonia "several times"  . Retinopathy of both eyes   . Seasonal allergies   . Sickle cell trait ([place])   . Type II diabetes mellitus ([place])    uncontrolled    Review of Systems:   Review of Systems  Constitutional: Negative for chills, fever and weight loss.  HENT: Negative for hearing loss.   Eyes: Negative for blurred vision, double vision and photophobia.  Respiratory: Negative for cough.   Cardiovascular: Negative for chest pain, palpitations, orthopnea and leg swelling.  Gastrointestinal: Negative for abdominal pain, constipation, diarrhea, nausea and vomiting.  Genitourinary: Negative for dysuria, frequency, hematuria and urgency.  Skin: Negative for rash.  Endo/Heme/Allergies: Negative for polydipsia.   Physical Exam:  Vitals:   10/12/16 1345  BP: (!) 159/89  Pulse: 83  Temp: 98.5 F (36.9 C)  TempSrc: Oral  SpO2: 100%  Weight: 93.8 kg (206 lb 12.8 oz)   Physical Exam    Constitutional: NAD, pleasant ENT: oropharynx without erythema or exudates CV: RRR, no murmurs, rubs or gallops appreciated, pulses intact Resp: CTAB, no wheezing or crackles, no increased work of breathing Abd: soft, +BS, NDNT MSK: Left LE in immobilization boot, able to move all 4 extremities freely   Assessment & Plan:   See Encounters Tab for problem based charting.   Patient seen with Dr. Abelardo Diesel, MD Internal Medicine PGY1

## 2016-10-12 NOTE — Patient Instructions (Signed)
For your congestion: Try saline nasal spray Flonase nasal spray - once per day Citirizine - one pill at night to dry up secretions (sent to [facility] as it may be covered by insurance)  For your blood pressure: Continue taking the metoprolol 50mg  twice a day We are increasing the lisinopril to 20mg  once a day  For your diabetes: Continue the novolog 70/30 mix 18U BID Metformin 1000mg  with breakfast and with dinner Please start checking your sugar 2-3 times a day  I have sent in a referral for the eye doctor.   Keep up the good work!! [employer] see you in about a month to check on your blood pressure and check your A1c!

## 2016-10-13 LAB — BMP8+ANION GAP
Anion Gap: 16 mmol/L (ref 10.0–18.0)
BUN / CREAT RATIO: 17 (ref 9–23)
BUN: 29 mg/dL — ABNORMAL HIGH (ref 6–24)
CALCIUM: 8.5 mg/dL — AB (ref 8.7–10.2)
CHLORIDE: 104 mmol/L (ref 96–106)
CO2: 23 mmol/L (ref 18–29)
Creatinine, Ser: 1.67 mg/dL — ABNORMAL HIGH (ref 0.57–1.00)
GFR calc non Af Amer: 38 mL/min/{1.73_m2} — ABNORMAL LOW (ref >59)
GFR, EST AFRICAN AMERICAN: 43 mL/min/{1.73_m2} — AB (ref >59)
GLUCOSE: 132 mg/dL — AB (ref 65–99)
POTASSIUM: 4.4 mmol/L (ref 3.5–5.2)
Sodium: 143 mmol/L (ref 134–144)

## 2016-10-14 NOTE — Assessment & Plan Note (Signed)
Patient with T2DM on insulin and metformin. Her metformin was increased to 500mg  TID dosing last visit and she has tolerated this well without GI side effects. She does not check her CBG's often (brought in meter which had 4 readings). She has her own place now so it is easier to pick healthier choices for meals, however patient admits that she could do better with those choices.   Plan: --increase metformin to 1000mg  BID --Continue Novolog Mix 70/30 18U BID --Asked patient to check her CBG's 2-3 times daily to better assess her day to day management for accurate dosing  --Check A1c at 4 week f/u visit --Ophtho consult

## 2016-10-14 NOTE — Assessment & Plan Note (Signed)
Patient has one week history of nasal congestion, rhinorrhea, cough (was productive at first, now dry). Her symptoms at first were also associated with diarrhea that has resolved. She denies fevers or chills.  Plan: --flonase, saline nasal spray, cetirizine --f/u if no improvement in symtpoms in 7-10 days

## 2016-10-14 NOTE — Assessment & Plan Note (Signed)
Patient followed by OB/Gyn and was on Megace BID which was controlling her symptoms well. On her refill, she was tried on once a day dosing with resumption of symptoms.  Plan: --Patient instructed to follow up with OB/Gyn

## 2016-10-14 NOTE — Assessment & Plan Note (Signed)
Patient is looking forward to ortho f/u for possible surgery for her Charcot foot. She has done well to get her DM under better control, HTN is getting better, and she has her cardiology appointment for surgery clearance next week.   Plan: --We are continuing to address her DM to make sure it stays well controlled

## 2016-10-14 NOTE — Assessment & Plan Note (Signed)
Patient with better control but still hypertensive after addition of Lisinopril 10mg  daily to her metoprolol at last visit. Patient denies chest pain, shortness of breath, headaches, vision or hearing changes, dizziness or orthostatic symptoms.   Plan: --Increase Lisinopril to 20mg  daily --Continue metoprolol 50mg  BID --f/u in 4 weeks for BP check and BMP to check for renal effect of lisinopril

## 2016-10-16 NOTE — Progress Notes (Signed)
Internal Medicine Clinic Attending  I saw and evaluated the patient.  I personally confirmed the key portions of the history and exam documented by Dr. Svalina and I reviewed pertinent patient test results.  The assessment, diagnosis, and plan were formulated together and I agree with the documentation in the resident's note.  

## 2016-10-16 NOTE — Progress Notes (Deleted)
Internal Medicine Clinic Attending  I saw and evaluated the patient.  I personally confirmed the key portions of the history and exam documented by Dr. Svalina and I reviewed pertinent patient test results.  The assessment, diagnosis, and plan were formulated together and I agree with the documentation in the resident's note.  

## 2016-10-17 ENCOUNTER — Encounter: Payer: Self-pay | Admitting: Cardiology

## 2016-10-17 ENCOUNTER — Ambulatory Visit (INDEPENDENT_AMBULATORY_CARE_PROVIDER_SITE_OTHER): Payer: Medicaid Other | Admitting: Cardiology

## 2016-10-17 ENCOUNTER — Telehealth: Payer: Self-pay | Admitting: General Practice

## 2016-10-17 VITALS — BP 140/90 | HR 92 | Ht 65.0 in | Wt 206.8 lb

## 2016-10-17 DIAGNOSIS — I2510 Atherosclerotic heart disease of native coronary artery without angina pectoris: Secondary | ICD-10-CM

## 2016-10-17 DIAGNOSIS — Z01810 Encounter for preprocedural cardiovascular examination: Secondary | ICD-10-CM

## 2016-10-17 DIAGNOSIS — I779 Disorder of arteries and arterioles, unspecified: Secondary | ICD-10-CM | POA: Diagnosis not present

## 2016-10-17 DIAGNOSIS — E11610 Type 2 diabetes mellitus with diabetic neuropathic arthropathy: Secondary | ICD-10-CM | POA: Diagnosis not present

## 2016-10-17 DIAGNOSIS — E6609 Other obesity due to excess calories: Secondary | ICD-10-CM

## 2016-10-17 DIAGNOSIS — I10 Essential (primary) hypertension: Secondary | ICD-10-CM | POA: Diagnosis not present

## 2016-10-17 DIAGNOSIS — Z9861 Coronary angioplasty status: Secondary | ICD-10-CM

## 2016-10-17 DIAGNOSIS — Z955 Presence of coronary angioplasty implant and graft: Secondary | ICD-10-CM

## 2016-10-17 NOTE — Progress Notes (Signed)
PCP: Alphonzo Grieve, MD  Clinic Note: Chief Complaint  Patient presents with  . Follow-up    Echo and stent placement  . Shortness of Breath    With going up stairs  . Medical Clearance    Foot Surgery [facility]    HPI: Jane [name] is a 40 y.o. female with a PMH below who presents today for cardiology consultation for surgical clearance to treat Charcot foot. -- Recent ABIs from February 2017: triphasic waveforms in the right ankle brachial index with some calcification. Left ankle-brachial index noncompressible with monophasic waveforms. TBI's noncompressible. Would suggest distal left-sided peripheral disease below the knee.  She has diabetes mellitus with a Charcot foot on the left, along with hypertension, hyperlipidemia.  She ruled in for non-STEMI back in May of this year. She had a mid LAD 90% stenosis treated with a BMS. She was converted from Brilinta to Plavix after she had a vaginal bleed prior to discharge requiring transfusion. --> She has had significant social issues with as far as the homeless, in a shelter. But now she is apparently relocated to an apartment, and is undergoing evaluation for Medicaid. She is trying to get her life back on track, but needs to have her foot working and so she can try to find a job.  [name] was seen by Dr. Danton Clap on November 29 She was actually seen by Kerin Ransom, PA back in June this year.  Recent Hospitalizations: none since May 2017  Studies Reviewed:   Cardiac catheterization with PCI May 2017: Diagnostic Diagram - 90% mLAD; nl LV Fxn Post-Intervention Diagram - Vision BMS 3.52mm x 18 mm       2D Echo 03/2016: Normal LV size and thickness. EF 60-65%. GR 1 DD. Otherwise essentially normal.  Interval History: For the most part doing well - realized that she had not been taking her Metoprolol BID in Sept -- now correct.  PCP recently increased ACE-I to 20 mg. Occasional sharp CP episodes ~`1-2 / week That are  not at all similar to her angina pain that she had with her non-STEMI. She does have some exertional dyspnea while walking upstairs that seems to have predated her MI is probably more related to deconditioning now that she is not able to exercise because of her Charcot foot.  She denies any PND orthopnea or any swelling is not associated with her left foot issues. No palpitations, lightheadedness, dizziness, weakness or syncope/near syncope. No TIA/amaurosis fugax symptoms. No melena, hematochezia, hematuria, or epstaxis. No claudication.  ROS: A comprehensive was performed. Review of Systems  Constitutional: Positive for weight loss (not intentional - just decreased appetite). Negative for chills, fever and malaise/fatigue (Trying to stay off of foot - rests a lot).  HENT: Positive for congestion.   Respiratory: Negative for cough, shortness of breath and wheezing.   Cardiovascular: Positive for chest pain (occasional 1-2 min of fleeting sharp CP) and leg swelling (L Foot - but edema has improved). Negative for palpitations, orthopnea and claudication.  Gastrointestinal: Negative for blood in stool and melena.  Genitourinary: Negative for hematuria.       Noting some Ureteral Bleed since out of Megace  Musculoskeletal: Positive for joint pain (L foot pain - Charcot foot). Negative for back pain, myalgias and neck pain.  Endo/Heme/Allergies: Positive for environmental allergies.  Psychiatric/Behavioral: The patient is nervous/anxious (much better ).   All other systems reviewed and are negative.   Past Medical History:  Diagnosis Date  .  Asthma   . CAD S/P BMS PCI to prox LAD 03/31/2016   Ost LAD to Mid LAD lesion, 90% stenosed. Post intervention - Vision BMS 3.0 mm x 18 mm (~3.5 mm) there is a 0% residual stenosis.   . Depression   . Diabetic retinopathy ([place])   . H/O non-ST elevation myocardial infarction (NSTEMI) 03/2016   Found in 9% mid LAD lesion treated with bare-metal stent  (BMS) PCI - vision BMS 3.0 mm x 18 mm  . Heart murmur   . Hyperlipidemia   . Hypertension   . Iron deficiency anemia   . Menorrhagia   . Noncompliance with medications   . Peripheral neuropathy ([place])   . Pneumonia "several times"  . Retinopathy of both eyes   . Seasonal allergies   . Sickle cell trait ([place])   . Type II diabetes mellitus ([place])    uncontrolled    Past Surgical History:  Procedure Laterality Date  . CARDIAC CATHETERIZATION N/A 03/31/2016   Procedure: Left Heart Cath and Coronary Angiography;  Surgeon: Lorretta Harp, MD;  Location: [facility] INVASIVE CV LAB: 90% early mLAD, normal LV Fxn  . CARDIAC CATHETERIZATION N/A 03/31/2016   Procedure: Coronary Stent Intervention;  Surgeon: Lorretta Harp, MD;  Location: [facility] CV LAB;  Service: Cardiovascular: PCI to mLAD BMS Vision 3.0 mm x 18 mm  . CESAREAN SECTION  1997  . TRANSTHORACIC ECHOCARDIOGRAM  03/2016   Normal LV size and thickness. EF 60-65%. GR 1 DD. Otherwise essentially normal.  . TUBAL LIGATION  1997   Current Meds  Medication Sig  . ASPIR-LOW 81 MG EC tablet TAKE 1 Tablet BY MOUTH ONCE DAILY  . atorvastatin (LIPITOR) 40 MG tablet TAKE 1 Tablet BY MOUTH EVERY EVENING AT 6 PM  . cetirizine (ZYRTEC) 10 MG tablet Take 1 tablet (10 mg total) by mouth daily.  . clopidogrel (PLAVIX) 75 MG tablet Take 1 tablet (75 mg total) by mouth daily. Internal Medicine Program  . ferrous sulfate 325 (65 FE) MG tablet TAKE 1 Tablet BY MOUTH EVERY MORNING WITH BREAKFAST  . HUMULIN 70/30 (70-30) 100 UNIT/ML injection INJECT 18 UNITS UNDER THE SKIN TWICE DAILY WITH MEALS (EACH VIAL IS GOOD FOR 28 DAYS ONCE OPENED)  . lisinopril (PRINIVIL,ZESTRIL) 20 MG tablet Take 1 tablet (20 mg total) by mouth daily.  . megestrol (MEGACE) 40 MG tablet Take 1 tablet (40 mg total) by mouth daily. Internal Medicine Program  . metFORMIN (GLUCOPHAGE) 500 MG tablet Take 2 tablets (1,000 mg total) by mouth 2 (two) times daily with a meal.  . metoprolol  (LOPRESSOR) 50 MG tablet TAKE 1 Tablet  BY MOUTH TWICE DAILY  . PROVENTIL HFA 108 (90 Base) MCG/ACT inhaler INHALE 2 PUFFS BY MOUTH EVERY 4 HOURS AS NEEDED FOR COUGHING, WHEEZING, OR SHORTNESS OF BREATH   No Known Allergies   Social History   Social History  . Marital status: Single    Spouse name: N/A  . Number of children: 2  . Years of education: 13   Occupational History  .   [employer]   Social History Main Topics  . Smoking status: Former Smoker    Packs/day: 0.25    Years: 1.00    Types: Cigarettes    Quit date: 08/29/2015  . Smokeless tobacco: Never Used  . Alcohol use No     Comment: 12/23/2015 "last alcohol was ~ Christmas 2016; that was very little alcohol"  . Drug use: No  Comment: 12/23/2015 "used to smoke marijuana; quit 04/2013"  . Sexual activity: Yes    Birth control/ protection: Surgical   Other Topics Concern  . None   Social History Narrative   Patient does not drink caffeine.   Patient is right handed.   -- As of June, she was living woman shelter in [place].  Now placed in an apartment.  Hopes to do better meal planning.  Medicare   Family History  Problem Relation Age of Onset  . Stroke Mother   . Diabetes Mother   . Hypertension Mother   . Aneurysm Mother   . Diabetes Father   . Hypertension Father   . Diabetes Sister   . Hypertension Sister   . Diabetes Maternal Grandmother   . Diabetes Maternal Grandfather   . Cancer Paternal Grandfather     Prostate    Wt Readings from Last 3 Encounters:  10/17/16 93.8 kg (206 lb 12.8 oz)  10/12/16 93.8 kg (206 lb 12.8 oz)  08/04/16 100.8 kg (222 lb 3.2 oz)    PHYSICAL EXAM BP 140/90   Pulse 92   Ht 5\' 5"  (1.651 m)   Wt 93.8 kg (206 lb 12.8 oz)   LMP 10/17/2016   BMI 34.41 kg/m  General appearance: alert, cooperative, appears stated age, no distress and Mildly to moderately obese; well-nourished and well-groomed. She has a single leg rolling scooter and her left foot in a  brace. HEENT: [place]/AT, EOMI, MMM, anicteric sclera Neck: no adenopathy, no carotid bruit and no JVD Lungs: clear to auscultation bilaterally, normal percussion bilaterally and non-labored Heart: regular rate and rhythm, S1& S2 normal, no murmur, click, rub or gallop; non-displaced PMI Abdomen: soft, non-tender; bowel sounds normal; no masses,  no organomegaly; no HJR Extremities: extremities normal, atraumatic, no cyanosis, and edema - only swelling noted in L ankle Pulses: 2+ and symmetric; b/c L leg/ankle in brace, unable to palpate LLE pedal pulses. Skin: mobility and turgor normal, no evidence of bleeding or bruising and no lesions noted  Neurologic: Mental status: Alert, oriented, thought content appropriate Cranial nerves: normal (II-XII grossly intact)    Adult ECG Report  Rate: 92;  Rhythm: normal sinus rhythm and Left Axis deviation.  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 More related to her left foot at this time. Aggressive cardiovascular risk factor modification for now, will need to be followed up after her foot procedure.  She probably would benefit from full arterial Dopplers to determine if there is any potential interventional targets. -- We'll need to see who she has been seen by to determine  if we can refer her to vascular cardiology versus vascular surgery      CAD S/P BMS PCI to prox LAD (Chronic)  Converted from Brilinta to Plavix which is generic. This should not be an issue. She had bleeding issues with Brilinta. With bare-metal stent PCI, she is now 6 months out and would be okay to hold Plavix for a surgical procedure at this time. We would like to be restarted postop, but this is more for secondary prevention in the first year status post non-STEMI. Back on statin as well as beta blocker and ACE inhibitor.  She has some intermittent chest discomfort that sounds more musculoskeletal in nature. Is not like her prior anginal symptom, is very atypical and not always exertional. I don't think this needs to be evaluated with a stress test.      Relevant Orders   EKG 12-Lead   Presence of bare metal stent in LAD coronary artery (Chronic)    For now she is on aspirin and Plavix. Okay to stop Plavix for surgery at this time since she is more than 6 months out from a bare-metal stent. Would restart post-op.      Preoperative cardiovascular examination - Primary (Chronic)    She is overall doing well with no active Cardiac Complaints.   PREOPERATIVE CARDIAC RISK ASSESSMENT   Revised Cardiac Risk Index:  High Risk Surgery: no; foot  Defined as Intraperitoneal, intrathoracic or suprainguinal vascular  Active CAD: no; atypical MSK CP, but no anigna  CHF: no;   Cerebrovascular Disease: no;   Diabetes: yes; On Insulin: yes  CKD (Cr >~ 2): no; Cr 1.67  Total: 1 Estimated Risk of Adverse Outcome: LOW RISK for LOW RISK Sgx.  Estimated Risk of MI, PE, VF/VT (Cardiac Arrest), Complete Heart Block: 0.9 %, reduced by long-term BB   ACC/AHA Guidelines for "Clearance":  Step 1 - Need for Emergency Surgery: No: urgent  If Yes - go straight to OR with perioperative surveillance  Step 2 - Active Cardiac Conditions (Unstable Angina, Decompensated HF, Significant  Arrhytmias  - Complete HB, Mobitz II, Symptomatic VT or SVT, Severe Aortic Stenosis - mean gradient > 40 mmHg, Valve area < 1.0 cm2):   No:   If Yes - Evaluate & Treat per ACC/AHA Guidelines  Step 3 -  Low Risk Surgery: Yes  If Yes --> proceed to OR  If No --> Step 4  Step 4 - Functional Capacity >= 4 METS without symptoms: Yes  If Yes --> proceed to OR  If No --> Step 5  Step 5 --  Clinical Risk Factors (CRF)   3 or more: No:   If Yes -- assess Surgical Risk, --   (High Risk Non-cardiac), Intraabdominal or thoracic vascular surgery consider testing if it will change management.  Intermediate Risk: Proceed to OR with HR control, or consider testing if it will change management  1-2 or more CRFs: Yes  Intermediate Risk: Proceed to OR with HR control, or consider testing if it will change management -- Would not change management as it would only serve to delay her needed surgery. I would prefer for her to have her foot fixed and be able to ambulate to lose weight.      Relevant Orders   EKG 12-Lead   Obesity (Chronic)    This is unfortunately a difficult situation with her financial difficulties, and now Charcot foot deformity of revealed exercise. Hence the reason for needing her to have the foot surgically repaired in order to get her back on her feet. She will need to do some type of rehabilitation, but unfortunately is probably too far  out of the window for cardiac rehabilitation.      Essential hypertension (Chronic)    Still not quite at goal based on new guidelines and with her diabetes. She is on metoprolol 50 mg twice a day as well as her increased dose of lisinopril from just a recent evaluation. Unfortunate she just took her blood pressure medications on this morning so we really can't tell what her pressures will be later on today. For now I would simply monitor her pressures. Could potentially titrate up her beta blocker with heart rate of 92 bpm --> versus potentially convert  to carvedilol.      Charcot foot due to diabetes mellitus ([place]) (Chronic)      Current medicines are reviewed at length with the patient today. (+/- concerns) none The following changes have been made: none  Patient Instructions  OKAY TO HAVE SURGERY , WITH ORTHOPEDIC MAY STOP CLOPIDOGREL FOR 5-7 DAYS PRIOR TO SURGERY ,MAY HOLD ASPIRIN IF NEEDED BUT PREFER NOT TO HOLD UNLESS NECESSARY,  PLEASE RESTART AFTER SURGERY PER SURGEON INSTRUCTIONS.   NO CHANGES OTHERWISE.  Your physician wants you to follow-up in: [place].  You will receive a reminder letter in the mail two months in advance. If you don't receive a letter, please call our office to schedule the follow-up appointment.   If you need a refill on your cardiac medications before your next appointment, please call your pharmacy.     Studies Ordered:   Orders Placed This Encounter  Procedures  . EKG 12-Lead      Glenetta Hew, M.D., M.S. Interventional Cardiologist   Pager # [phone] Phone # [phone] [address] [place], [place] [address]

## 2016-10-17 NOTE — Assessment & Plan Note (Addendum)
She is overall doing well with no active Cardiac Complaints.   PREOPERATIVE CARDIAC RISK ASSESSMENT   Revised Cardiac Risk Index:  High Risk Surgery: no; foot  Defined as Intraperitoneal, intrathoracic or suprainguinal vascular  Active CAD: no; atypical MSK CP, but no anigna  CHF: no;   Cerebrovascular Disease: no;   Diabetes: yes; On Insulin: yes  CKD (Cr >~ 2): no; Cr 1.67  Total: 1 Estimated Risk of Adverse Outcome: LOW RISK for LOW RISK Sgx.  Estimated Risk of MI, PE, VF/VT (Cardiac Arrest), Complete Heart Block: 0.9 %, reduced by long-term BB   ACC/AHA Guidelines for "Clearance":  Step 1 - Need for Emergency Surgery: No: urgent  If Yes - go straight to OR with perioperative surveillance  Step 2 - Active Cardiac Conditions (Unstable Angina, Decompensated HF, Significant  Arrhytmias - Complete HB, Mobitz II, Symptomatic VT or SVT, Severe Aortic Stenosis - mean gradient > 40 mmHg, Valve area < 1.0 cm2):   No:   If Yes - Evaluate & Treat per ACC/AHA Guidelines  Step 3 -  Low Risk Surgery: Yes  If Yes --> proceed to OR  If No --> Step 4  Step 4 - Functional Capacity >= 4 METS without symptoms: Yes  If Yes --> proceed to OR  If No --> Step 5  Step 5 --  Clinical Risk Factors (CRF)   3 or more: No:   If Yes -- assess Surgical Risk, --   (High Risk Non-cardiac), Intraabdominal or thoracic vascular surgery consider testing if it will change management.  Intermediate Risk: Proceed to OR with HR control, or consider testing if it will change management  1-2 or more CRFs: Yes  Intermediate Risk: Proceed to OR with HR control, or consider testing if it will change management -- Would not change management as it would only serve to delay her needed surgery. I would prefer for her to have her foot fixed and be able to ambulate to lose weight.

## 2016-10-17 NOTE — Telephone Encounter (Signed)
Patient called and left message stating she needs a prescription refill for megace. Patient states it was dispensed with a quantity of 30 and she needs it to be 60. She was told to take 1 pill but was still bleeding so she took 2. Patient states she has started to bleed again. Called [facility] outpatient pharmacy who had no received Rx. Med reordered. Called patient & a man answered stating to call her on her cell phone. Will send mychart message

## 2016-10-17 NOTE — Patient Instructions (Signed)
OKAY TO HAVE SURGERY , WITH ORTHOPEDIC MAY STOP CLOPIDOGREL FOR 5-7 DAYS PRIOR TO SURGERY ,MAY HOLD ASPIRIN IF NEEDED BUT PREFER NOT TO HOLD UNLESS NECESSARY,  PLEASE RESTART AFTER SURGERY PER SURGEON INSTRUCTIONS.   NO CHANGES OTHERWISE.  Your physician wants you to follow-up in: [place].  You will receive a reminder letter in the mail two months in advance. If you don't receive a letter, please call our office to schedule the follow-up appointment.   If you need a refill on your cardiac medications before your next appointment, please call your pharmacy.

## 2016-10-18 NOTE — Assessment & Plan Note (Addendum)
Converted from Brilinta to Plavix which is generic. This should not be an issue. She had bleeding issues with Brilinta. With bare-metal stent PCI, she is now 6 months out and would be okay to hold Plavix for a surgical procedure at this time. We would like to be restarted postop, but this is more for secondary prevention in the first year status post non-STEMI. Back on statin as well as beta blocker and ACE inhibitor.  She has some intermittent chest discomfort that sounds more musculoskeletal in nature. Is not like her prior anginal symptom, is very atypical and not always exertional. I don't think this needs to be evaluated with a stress test.

## 2016-10-18 NOTE — Assessment & Plan Note (Signed)
Still not quite at goal based on new guidelines and with her diabetes. She is on metoprolol 50 mg twice a day as well as her increased dose of lisinopril from just a recent evaluation. Unfortunate she just took her blood pressure medications on this morning so we really can't tell what her pressures will be later on today. For now I would simply monitor her pressures. Could potentially titrate up her beta blocker with heart rate of 92 bpm --> versus potentially convert to carvedilol.

## 2016-10-18 NOTE — Assessment & Plan Note (Addendum)
Not really noticing any true claudication symptoms. More related to her left foot at this time. Aggressive cardiovascular risk factor modification for now, will need to be followed up after her foot procedure.  She probably would benefit from full arterial Dopplers to determine if there is any potential interventional targets. -- We'll need to see who she has been seen by to determine if we can refer her to vascular cardiology versus vascular surgery

## 2016-10-18 NOTE — Assessment & Plan Note (Signed)
For now she is on aspirin and Plavix. Okay to stop Plavix for surgery at this time since she is more than 6 months out from a bare-metal stent. Would restart post-op.

## 2016-10-18 NOTE — Assessment & Plan Note (Signed)
This is unfortunately a difficult situation with her financial difficulties, and now Charcot foot deformity of revealed exercise. Hence the reason for needing her to have the foot surgically repaired in order to get her back on her feet. She will need to do some type of rehabilitation, but unfortunately is probably too far out of the window for cardiac rehabilitation.

## 2016-10-20 MED FILL — ALL DAY ALLERGY 10 MG TAB: 10 | 30 days supply | Qty: 30 | Fill #0

## 2016-10-20 MED FILL — LISINOPRIL 20 MG TABLET: 20 | 30 days supply | Qty: 30 | Fill #0

## 2016-10-24 MED FILL — CLOPIDOGREL 75 MG TABLET: 75 | 30 days supply | Qty: 30 | Fill #5

## 2016-10-26 ENCOUNTER — Other Ambulatory Visit: Payer: Self-pay | Admitting: Orthopedic Surgery

## 2016-10-27 MED FILL — MEGESTROL 40 MG TABLET: 40 | 30 days supply | Qty: 30 | Fill #1

## 2016-11-04 ENCOUNTER — Ambulatory Visit: Payer: Self-pay

## 2016-11-09 ENCOUNTER — Telehealth: Payer: Self-pay | Admitting: Internal Medicine

## 2016-11-09 ENCOUNTER — Other Ambulatory Visit: Payer: Self-pay | Admitting: Internal Medicine

## 2016-11-09 DIAGNOSIS — I2510 Atherosclerotic heart disease of native coronary artery without angina pectoris: Secondary | ICD-10-CM

## 2016-11-09 DIAGNOSIS — Z9861 Coronary angioplasty status: Principal | ICD-10-CM

## 2016-11-09 NOTE — Telephone Encounter (Signed)
APT. REMINDER CALL, NO ANSWER, NO VOICEMAIL °

## 2016-11-10 ENCOUNTER — Ambulatory Visit: Payer: Self-pay

## 2016-11-14 DIAGNOSIS — E11610 Type 2 diabetes mellitus with diabetic neuropathic arthropathy: Secondary | ICD-10-CM

## 2016-11-14 HISTORY — DX: Type 2 diabetes mellitus with diabetic neuropathic arthropathy: E11.610

## 2016-11-15 ENCOUNTER — Telehealth: Payer: Self-pay | Admitting: Internal Medicine

## 2016-11-15 NOTE — Telephone Encounter (Signed)
APT. REMINDER CALL, NO ANSWER, NO VOICEMAIL °

## 2016-11-16 ENCOUNTER — Other Ambulatory Visit: Payer: Self-pay | Admitting: Dietician

## 2016-11-16 ENCOUNTER — Ambulatory Visit (INDEPENDENT_AMBULATORY_CARE_PROVIDER_SITE_OTHER): Payer: Medicaid Other | Admitting: Dietician

## 2016-11-16 ENCOUNTER — Ambulatory Visit (INDEPENDENT_AMBULATORY_CARE_PROVIDER_SITE_OTHER): Payer: Medicaid Other | Admitting: Internal Medicine

## 2016-11-16 VITALS — BP 179/92 | HR 82 | Temp 98.5°F | Ht 65.0 in | Wt 225.1 lb

## 2016-11-16 DIAGNOSIS — E1151 Type 2 diabetes mellitus with diabetic peripheral angiopathy without gangrene: Secondary | ICD-10-CM | POA: Diagnosis not present

## 2016-11-16 DIAGNOSIS — Z79899 Other long term (current) drug therapy: Secondary | ICD-10-CM | POA: Diagnosis not present

## 2016-11-16 DIAGNOSIS — E113493 Type 2 diabetes mellitus with severe nonproliferative diabetic retinopathy without macular edema, bilateral: Secondary | ICD-10-CM | POA: Diagnosis not present

## 2016-11-16 DIAGNOSIS — I10 Essential (primary) hypertension: Secondary | ICD-10-CM | POA: Diagnosis not present

## 2016-11-16 DIAGNOSIS — E1165 Type 2 diabetes mellitus with hyperglycemia: Secondary | ICD-10-CM | POA: Diagnosis not present

## 2016-11-16 DIAGNOSIS — Z794 Long term (current) use of insulin: Secondary | ICD-10-CM | POA: Diagnosis not present

## 2016-11-16 DIAGNOSIS — Z713 Dietary counseling and surveillance: Secondary | ICD-10-CM | POA: Diagnosis not present

## 2016-11-16 DIAGNOSIS — Z87891 Personal history of nicotine dependence: Secondary | ICD-10-CM | POA: Diagnosis not present

## 2016-11-16 DIAGNOSIS — H43392 Other vitreous opacities, left eye: Secondary | ICD-10-CM

## 2016-11-16 LAB — POCT GLYCOSYLATED HEMOGLOBIN (HGB A1C): HEMOGLOBIN A1C: 7.5

## 2016-11-16 LAB — GLUCOSE, CAPILLARY: GLUCOSE-CAPILLARY: 96 mg/dL (ref 65–99)

## 2016-11-16 MED ORDER — ACCU-CHEK GUIDE W/DEVICE KIT: 1.0000 | PACK | Freq: Three times a day (TID) | 0 refills | Status: DC

## 2016-11-16 MED ORDER — "INSULIN SYRINGE-NEEDLE U-100 31G X 15/64"" 0.3 ML MISC": 12 refills | Status: DC

## 2016-11-16 MED ORDER — GLUCOSE BLOOD VI STRP: ORAL_STRIP | 12 refills | Status: DC

## 2016-11-16 MED ORDER — ACCU-CHEK FASTCLIX LANCETS MISC: 12 refills | Status: DC

## 2016-11-16 MED ORDER — INSULIN PEN NEEDLE 31G X 5 MM MISC: 2 refills | Status: DC

## 2016-11-16 MED ORDER — EXENATIDE 5 MCG/0.02ML ~~LOC~~ SOPN: 5.0000 ug | PEN_INJECTOR | Freq: Two times a day (BID) | SUBCUTANEOUS | 11 refills | Status: DC

## 2016-11-16 MED ORDER — CARVEDILOL 6.25 MG PO TABS: 6.2500 mg | ORAL_TABLET | Freq: Two times a day (BID) | ORAL | 0 refills | Status: DC

## 2016-11-16 MED ORDER — LIRAGLUTIDE 18 MG/3ML ~~LOC~~ SOPN: 0.6000 mg | PEN_INJECTOR | Freq: Every day | SUBCUTANEOUS | 0 refills | Status: DC

## 2016-11-16 MED FILL — CARVEDILOL 6.25 MG TABLET: 6.25 | 30 days supply | Qty: 60 | Fill #0

## 2016-11-16 MED FILL — ACCU-CHEK GUIDE TEST STRIP: 30 days supply | Qty: 100 | Fill #0

## 2016-11-16 NOTE — Patient Instructions (Signed)
Your BP is still high and we will change your metoprolol to a new medication called carvedilol which you will take 6.25mg  two times every day.  We will also stop your metformin and start you on an injection medication called Byetta which you will take two times every day. Please check your blood sugar 3 times every day and bring you meter to your follow up appointment.  We will see you back in 1 week to check your BP and blood sugar.

## 2016-11-16 NOTE — Progress Notes (Signed)
CC: BP f/u HPI: [name] is a 41 y.o. female with a h/o of CAD, PVD, HTN, DM who presents for f/u of BP and DM.  Please see Problem-based charting for HPI and the status of patient's chronic medical conditions.  Past Medical History:  Diagnosis Date  . Asthma   . CAD S/P BMS PCI to prox LAD 03/31/2016   Ost LAD to Mid LAD lesion, 90% stenosed. Post intervention - Vision BMS 3.0 mm x 18 mm (~3.5 mm) there is a 0% residual stenosis.   . Depression   . Diabetic retinopathy ([place])   . H/O non-ST elevation myocardial infarction (NSTEMI) 03/2016   Found in 9% mid LAD lesion treated with bare-metal stent (BMS) PCI - vision BMS 3.0 mm x 18 mm  . Heart murmur   . Hyperlipidemia   . Hypertension   . Iron deficiency anemia   . Menorrhagia   . Noncompliance with medications   . Peripheral neuropathy ([place])   . Pneumonia "several times"  . Retinopathy of both eyes   . Seasonal allergies   . Sickle cell trait ([place])   . Type II diabetes mellitus ([place])    uncontrolled   Social Hx: Social History  Substance Use Topics  . Smoking status: Former Smoker    Packs/day: 0.25    Years: 1.00    Types: Cigarettes    Quit date: 08/29/2015  . Smokeless tobacco: Never Used  . Alcohol use No     Comment: 12/23/2015 "last alcohol was ~ Christmas 2016; that was very little alcohol"   Review of Systems: ROS in HPI. Otherwise: Review of Systems  Constitutional: Negative for chills, fever and weight loss.  Respiratory: Negative for cough and shortness of breath.   Cardiovascular: Negative for chest pain and leg swelling.  Gastrointestinal: Negative for abdominal pain, constipation, diarrhea, nausea and vomiting.  Genitourinary: Negative for dysuria, frequency and urgency.   Physical Exam: Vitals:   11/16/16 1126  BP: (!) 179/92  Pulse: 82  Temp: 98.5 F (36.9 C)  TempSrc: Oral  SpO2: 100%  Weight: 225 lb 1.6 oz (102.1 kg)  Height: 5\' 5"  (1.651 m)   Physical Exam    Constitutional: She appears well-developed. She is cooperative. No distress.  Cardiovascular: Normal rate, regular rhythm, normal heart sounds and normal pulses.  Exam reveals no gallop.   No murmur heard. Pulmonary/Chest: Effort normal and breath sounds normal. No respiratory distress. She has no wheezes. She has no rhonchi. She has no rales. Breasts are symmetrical.  Abdominal: Soft. Bowel sounds are normal. There is no tenderness.  Musculoskeletal: She exhibits no edema.   Assessment & Plan:  See encounters tab for problem based medical decision making. Patient discussed with Dr. 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 She is planning surgical procedure on her left foot later this month. Last A1c in September was 7.5 we will recheck today. Insulin regimen and metformin was adjusted at her last visit with her PCP she does not have her meter and we have provided one for her today. Her A1c is 7.5 again today and her blood glucoses 96 but this is in the setting of not having eaten yet today. Feel that her blood glucoses are likely still uncontrolled.  Patient also reports that she has been intolerant to metformin over the last several months with daily episodes of diarrhea  that had been significant. She denies any blood in her stool or significant abdominal pain patient ran out of her metformin 2 days ago and since this time has had no further episodes of diarrhea. Given this association we would like to discontinue metformin at this time and start another hypoglycemic. I feel that exenatide would be a good choice for her and we will prescribe this for a trial. We started 5 g twice a day injection and follow-up blood glucoses in 5 days. We will not alter insulin regimen until we have blood glucose meter today at her follow-up visit.  Essential  hypertension Patient presents for follow-up of hypertension after adjustment of lisinopril to 20 mg daily by PCP 4 weeks ago. She has surgery planned on her foot for later this month and good blood pressure control be important for presurgical risk modification. Her blood pressure today is significantly elevated at 179/82. Would recommend transitioning her from metoprolol 50 mg 2 carvedilol 6.25 mg twice a day for better blood pressure control.  Patient denies any changes to vision, headaches, chest pain, shortness of breath.  I recommend she follow-up in the clinic again in several days prior to her operation to recheck blood pressure.  Vitreous floater, left Patient plans of string-like object in the left visual field which is present only with left eye open. She has no significant discomfort this sensation started today she has had no other visual changes. She is most likely related to vitreous floater from PVD she has follow-up with ophthalmology already scheduled for 11/22/2016 for diuretic retinopathy in can be fully evaluated at this appointment.  Signed: Holley Raring, MD 11/16/2016, 12:22 PM  Pager: [phone]

## 2016-11-16 NOTE — Assessment & Plan Note (Addendum)
Patient presents for follow-up evaluation of hypertension and diabetes. She is planning surgical procedure on her left foot later this month. Last A1c in September was 7.5 we will recheck today. Insulin regimen and metformin was adjusted at her last visit with her PCP she does not have her meter and we have provided one for her today. Her A1c is 7.5 again today and her blood glucoses 96 but this is in the setting of not having eaten yet today. Feel that her blood glucoses are likely still uncontrolled.  Patient also reports that she has been intolerant to metformin over the last several months with daily episodes of diarrhea that had been significant. She denies any blood in her stool or significant abdominal pain patient ran out of her metformin 2 days ago and since this time has had no further episodes of diarrhea. Given this association we would like to discontinue metformin at this time and start another hypoglycemic. I feel that exenatide would be a good choice for her and we will prescribe this for a trial. We started 5 g twice a day injection and follow-up blood glucoses in 5 days. We will not alter insulin regimen until we have blood glucose meter today at her follow-up visit.

## 2016-11-16 NOTE — Assessment & Plan Note (Signed)
Patient plans of string-like object in the left visual field which is present only with left eye open. She has no significant discomfort this sensation started today she has had no other visual changes. She is most likely related to vitreous floater from PVD she has follow-up with ophthalmology already scheduled for 11/22/2016 for diuretic retinopathy in can be fully evaluated at this appointment.

## 2016-11-16 NOTE — Assessment & Plan Note (Signed)
Patient presents for follow-up of hypertension after adjustment of lisinopril to 20 mg daily by PCP 4 weeks ago. She has surgery planned on her foot for later this month and good blood pressure control be important for presurgical risk modification. Her blood pressure today is significantly elevated at 179/82. Would recommend transitioning her from metoprolol 50 mg 2 carvedilol 6.25 mg twice a day for better blood pressure control.  Patient denies any changes to vision, headaches, chest pain, shortness of breath.  I recommend she follow-up in the clinic again in several days prior to her operation to recheck blood pressure.

## 2016-11-16 NOTE — Progress Notes (Signed)
Diabetes Self-Management Education  Visit Type: (P) First/Initial  Appt. Start Time: 1015 Appt. End Time: 9379  11/16/2016  [name], identified by name and date of birth, is a 41 y.o. female with a diagnosis of Diabetes: (P) Type 2.   ASSESSMENT  Last menstrual period 10/17/2016. There is no height or weight on file to calculate BMI.      Diabetes Self-Management Education - 11/16/16 1500      Visit Information   Visit Type (P)  First/Initial     Initial Visit   Diabetes Type (P)  Type 2   Are you currently following a meal plan? (P)  Yes   What type of meal plan do you follow? (P)  3 meals a day , moderate portions   Are you taking your medications as prescribed? (P)  Yes     Health Coping   How would you rate your overall health? (P)  Good     Psychosocial Assessment   Patient Belief/Attitude about Diabetes (P)  Motivated to manage diabetes   Self-care barriers (P)  Debilitated state due to current medical condition;Lack of material resources   Self-management support (P)  Doctor's office;Friends;Family;CDE visits   Patient Concerns (P)  Monitoring   Special Needs (P)  None   Preferred Learning Style (P)  No preference indicated   Learning Readiness (P)  Ready   How often do you need to have someone help you when you read instructions, pamphlets, or other written materials from your doctor or pharmacy? (P)  1 - Never   What is the last grade level you completed in school? (P)  --  12     Pre-Education Assessment   Patient understands the diabetes disease and treatment process. (P)  Demonstrates understanding / competency   Patient understands incorporating nutritional management into lifestyle. (P)  Demonstrates understanding / competency   Patient undertands incorporating physical activity into lifestyle. (P)  Demonstrates understanding / competency   Patient understands using medications safely. (P)  Demonstrates understanding / competency   Patient  understands monitoring blood glucose, interpreting and using results (P)  Needs Review   Patient understands prevention, detection, and treatment of acute complications. (P)  Demonstrates understanding / competency   Patient understands prevention, detection, and treatment of chronic complications. (P)  Demonstrates understanding / competency   Patient understands how to develop strategies to address psychosocial issues. (P)  Demonstrates understanding / competency   Patient understands how to develop strategies to promote health/change behavior. (P)  Demonstrates understanding / competency     Complications   Last HgB A1C per patient/outside source (P)  7.5 %   How often do you check your blood sugar? (P)  0 times/day (not testing)   Number of hypoglycemic episodes per month (P)  --  0-2   Number of hyperglycemic episodes per week (P)  5   Can you tell when your blood sugar is high? (P)  Yes   What do you do if your blood sugar is high? (P)  --  cut back on starch   Have you had a dilated eye exam in the past 12 months? (P)  Yes   Are you checking your feet? (P)  Yes      Individualized Plan for Diabetes Self-Management Training:   Learning Objective:  Patient will have a greater understanding of diabetes self-management. Patient education plan is to attend individual and/or group sessions per assessed needs and concerns.  My plan to support myself in  continuing these changes to care for my diabetes is to attend or contact:    local support resources -doctor's office, CDE, Dietitian, pharmacist, church Journals ? Diabetes Forecast- [phone]- ShinInjuries.fr ? Diabetes Self-Management- [phone]- http://www.odonnell.com/ .Plan:   There are no Patient Instructions on file for this visit.  Expected Outcomes:     Education material provided: meter and meter instructions  If problems or questions, patient to contact team via:  Phone  Future DSME appointment:    3-4 months or sooner  Sample Accu chek Guide Meter provided to patient today. She needs syringes and supplies for her new meter. Will coordinate care with the doctor seeing her today for those prescriptions.   She was shown the V-Go today and provided with a demo to wear and play with. She was educated about how this device works.

## 2016-11-18 ENCOUNTER — Other Ambulatory Visit: Payer: Self-pay | Admitting: Internal Medicine

## 2016-11-18 DIAGNOSIS — E113493 Type 2 diabetes mellitus with severe nonproliferative diabetic retinopathy without macular edema, bilateral: Secondary | ICD-10-CM

## 2016-11-18 DIAGNOSIS — Z794 Long term (current) use of insulin: Principal | ICD-10-CM

## 2016-11-18 MED FILL — MEGESTROL 40 MG TABLET: 40 | 30 days supply | Qty: 30 | Fill #2

## 2016-11-18 MED FILL — CLOPIDOGREL 75 MG TABLET: 75 | 30 days supply | Qty: 30 | Fill #6

## 2016-11-18 MED FILL — LISINOPRIL 20 MG TABLET: 20 | 30 days supply | Qty: 30 | Fill #1

## 2016-11-18 NOTE — Progress Notes (Signed)
Internal Medicine Clinic Attending  Case discussed with Dr. Strelow at the time of the visit.  We reviewed the resident's history and exam and pertinent patient test results.  I agree with the assessment, diagnosis, and plan of care documented in the resident's note.  

## 2016-11-21 ENCOUNTER — Telehealth: Payer: Self-pay | Admitting: *Deleted

## 2016-11-21 ENCOUNTER — Encounter (HOSPITAL_BASED_OUTPATIENT_CLINIC_OR_DEPARTMENT_OTHER): Payer: Self-pay | Admitting: *Deleted

## 2016-11-21 ENCOUNTER — Ambulatory Visit (INDEPENDENT_AMBULATORY_CARE_PROVIDER_SITE_OTHER): Payer: Medicaid Other | Admitting: Internal Medicine

## 2016-11-21 ENCOUNTER — Other Ambulatory Visit: Payer: Self-pay | Admitting: Internal Medicine

## 2016-11-21 ENCOUNTER — Encounter: Payer: Self-pay | Admitting: Internal Medicine

## 2016-11-21 VITALS — BP 179/83 | HR 90 | Temp 98.2°F | Ht 65.0 in | Wt 233.3 lb

## 2016-11-21 DIAGNOSIS — I10 Essential (primary) hypertension: Secondary | ICD-10-CM

## 2016-11-21 DIAGNOSIS — E113493 Type 2 diabetes mellitus with severe nonproliferative diabetic retinopathy without macular edema, bilateral: Secondary | ICD-10-CM

## 2016-11-21 DIAGNOSIS — Z7984 Long term (current) use of oral hypoglycemic drugs: Secondary | ICD-10-CM

## 2016-11-21 DIAGNOSIS — E1165 Type 2 diabetes mellitus with hyperglycemia: Secondary | ICD-10-CM | POA: Diagnosis not present

## 2016-11-21 DIAGNOSIS — Z87891 Personal history of nicotine dependence: Secondary | ICD-10-CM

## 2016-11-21 DIAGNOSIS — Z79899 Other long term (current) drug therapy: Secondary | ICD-10-CM | POA: Diagnosis not present

## 2016-11-21 DIAGNOSIS — Z794 Long term (current) use of insulin: Principal | ICD-10-CM

## 2016-11-21 LAB — GLUCOSE, CAPILLARY: Glucose-Capillary: 196 mg/dL — ABNORMAL HIGH (ref 65–99)

## 2016-11-21 MED ORDER — CARVEDILOL 12.5 MG PO TABS: 6.2500 mg | ORAL_TABLET | Freq: Two times a day (BID) | ORAL | 0 refills | Status: DC

## 2016-11-21 MED ORDER — EXENATIDE 5 MCG/0.02ML ~~LOC~~ SOPN: 5.0000 ug | PEN_INJECTOR | Freq: Two times a day (BID) | SUBCUTANEOUS | 11 refills | Status: DC

## 2016-11-21 MED ORDER — LISINOPRIL 40 MG PO TABS: 40.0000 mg | ORAL_TABLET | Freq: Every day | ORAL | 3 refills | Status: DC

## 2016-11-21 NOTE — Telephone Encounter (Addendum)
Information was sent to [place]. Tracks for PA for Byetta.  Patient has tried Metformin.  Awaiting decision from [place].  Sander Nephew, RN 11/21/2016 9:44 AM Byetta approved through [facility] 11/21/2016 thru 05/20/2017.  Call to [place] aware of.  Sander Nephew, RN 11/21/2016 4:40 PM

## 2016-11-21 NOTE — Assessment & Plan Note (Signed)
BP today is 179/83. She is currently on Lisinopril 20 mg daily and was recently (last week) transitioned off metoprolol to coreg 6.25 bid. HR today is 90. She has foot surgery planned for later this week and good blood pressure control will be important for pre-surgical risk modification.   She reports compliance with her medications today. Denies any side effects since starting the coreg. Denies any acute changes in vision, headaches, chest pain, shortness of breath.   Plan Checking BMET Will increase lisinopril to 40 mg daily and coreg to 12.5 mg bid F/U in 2 weeks for recheck and repeat BMET

## 2016-11-21 NOTE — Patient Instructions (Addendum)
Ms. Valla Leaver,  It was a pleasure meeting you today.  For your blood pressure. I am going to increase the dose of your lisinopril from 20 mg to 40 mg daily and your Coreg from 6.25 mg twice a day to 12.5 mg twice a day. I have sent in new prescriptions to your pharmacy for these doses. In the meantime, you can take 2 pills of your current medication (the 20 mg or 6.25 mg pill) until those run out.   I have sent in a new prescription for the Byetta to your pharmacy and we will call them to see what they need to get this medication approved.   I would like you to follow up with me in 2 weeks.

## 2016-11-21 NOTE — Assessment & Plan Note (Signed)
Last A1c 11/16/2016 was 7.5. She had previously been on metformin and Humulin 70/30 18 units bid. She reported intolerance to metformin due to GI side effects at last visit and this was stopped. She was given a prescription for Byetta at that time to replace the metformin.  She does bring her meter with today. Has 11 readings from 1/4-1/8. Has an average reading of 210. High of 309 and low of 139. Morning readings are 193-245 (3 readings). One afternoon reading of 143. Evening readings 194-309 (5 readings). Denies any episodes of hypoglycemia.   Reports insurance telling her that the Byetta was not covered and was unable to get it.  Plan Will continue current medications Patient is intolerant to Metformin and Byetta is on the Medicaid preferred list. Unclear why she was told insurance would not cover it. Spoke with Dr. [name] who will help [place] get the Byetta. F/U in 2 weeks.

## 2016-11-21 NOTE — Progress Notes (Signed)
   CC: HTN and DM follow up  HPI:  Jane [name] is a 41 y.o. female with a past medical history listed below here today for follow up of her HTN and DM.  HTN - BP today is 179/83. She is currently on Lisinopril 20 mg daily and was recently (last week) transitioned off metoprolol to coreg 6.25 bid. HR today is 90. She has foot surgery planned for later this week and good blood pressure control will be important for pre-surgical risk modification.   She reports compliance with her medications today. Denies any side effects since starting the coreg. Denies any acute changes in vision, headaches, chest pain, shortness of breath.   DM - Last A1c 11/16/2016 was 7.5. She had previously been on metformin and Humulin 70/30 18 units bid. She reported intolerance to metformin due to GI side effects at last visit and this was stopped. She was given a prescription for Byetta at that time to replace the metformin.  She does bring her meter with today. Has 11 readings from 1/4-1/8. Has an average reading of 210. High of 309 and low of 139. Morning readings are 193-245 (3 readings). One afternoon reading of 143. Evening readings 194-309 (5 readings). Denies any episodes of hypoglycemia.   Reports insurance telling her that the Byetta was not covered and was unable to get it.   Past Medical History:  Diagnosis Date  . Asthma   . CAD S/P BMS PCI to prox LAD 03/31/2016   Ost LAD to Mid LAD lesion, 90% stenosed. Post intervention - Vision BMS 3.0 mm x 18 mm (~3.5 mm) there is a 0% residual stenosis.   . Depression   . Diabetic retinopathy ([place])   . H/O non-ST elevation myocardial infarction (NSTEMI) 03/2016   Found in 9% mid LAD lesion treated with bare-metal stent (BMS) PCI - vision BMS 3.0 mm x 18 mm  . Heart murmur   . Hyperlipidemia   . Hypertension   . Iron deficiency anemia   . Menorrhagia   . Noncompliance with medications   . Peripheral neuropathy ([place])   . Pneumonia "several times"  .  Retinopathy of both eyes   . Seasonal allergies   . Sickle cell trait ([place])   . Type II diabetes mellitus ([place])    uncontrolled    Review of Systems:   Negative except as noted in HPI  Physical Exam:  Vitals:   11/21/16 1028  BP: (!) 179/83  Pulse: 90  Temp: 98.2 F (36.8 C)  TempSrc: Oral  SpO2: 100%  Weight: 233 lb 4.8 oz (105.8 kg)  Height: 5\' 5"  (1.651 m)   Physical Exam  Constitutional: She is well-developed, well-nourished, and in no distress. No distress.  Cardiovascular: Normal rate, regular rhythm and normal heart sounds.   Pulmonary/Chest: Effort normal and breath sounds normal. She has no wheezes. She has no rales.  Abdominal: Soft. Bowel sounds are normal. She exhibits no distension.  Skin: Skin is warm and dry.   Assessment & Plan:   See Encounters Tab for problem based charting.  Patient discussed with Dr. Angelia Mould

## 2016-11-22 ENCOUNTER — Telehealth: Payer: Self-pay | Admitting: Internal Medicine

## 2016-11-22 LAB — BMP8+ANION GAP
Anion Gap: 15 mmol/L (ref 10.0–18.0)
BUN / CREAT RATIO: 18 (ref 9–23)
BUN: 32 mg/dL — ABNORMAL HIGH (ref 6–24)
CALCIUM: 8.7 mg/dL (ref 8.7–10.2)
CHLORIDE: 103 mmol/L (ref 96–106)
CO2: 22 mmol/L (ref 18–29)
Creatinine, Ser: 1.79 mg/dL — ABNORMAL HIGH (ref 0.57–1.00)
GFR, EST AFRICAN AMERICAN: 40 mL/min/{1.73_m2} — AB (ref >59)
GFR, EST NON AFRICAN AMERICAN: 35 mL/min/{1.73_m2} — AB (ref >59)
GLUCOSE: 164 mg/dL — AB (ref 65–99)
POTASSIUM: 4.7 mmol/L (ref 3.5–5.2)
SODIUM: 140 mmol/L (ref 134–144)

## 2016-11-22 LAB — HM DIABETES EYE EXAM

## 2016-11-22 NOTE — Telephone Encounter (Addendum)
Called patient about the results of her BMET. Renal function is slightly bumped from previous. Surgery center called the clinic this morning to discuss. Believe she should be okay to have surgery but will hold off on increasing the lisinopril from 20 mg to 40 mg until after her surgery. Will need close follow up and recheck BMET afterwards. Renal function has been declining and will likely need further work up if continues to decline.

## 2016-11-22 NOTE — Pre-Procedure Instructions (Signed)
Spoke with Dr. Lalla Brothers at [facility] regarding pt's renal function trending upward - he reviewed her labs and states that he is OK with pt. having foot surgery 11/25/2015; states A1C is under good control.

## 2016-11-23 NOTE — Progress Notes (Signed)
Internal Medicine Clinic Attending  Case discussed with Dr. Boswell at the time of the visit.  We reviewed the resident's history and exam and pertinent patient test results.  I agree with the assessment, diagnosis, and plan of care documented in the resident's note.  

## 2016-11-24 ENCOUNTER — Ambulatory Visit (HOSPITAL_BASED_OUTPATIENT_CLINIC_OR_DEPARTMENT_OTHER): Payer: Medicaid Other | Admitting: Anesthesiology

## 2016-11-24 ENCOUNTER — Encounter (HOSPITAL_BASED_OUTPATIENT_CLINIC_OR_DEPARTMENT_OTHER)
Admission: RE | Disposition: A | Discharge status: Home / Self Care | Payer: Self-pay | Source: Ambulatory Visit | Attending: Orthopedic Surgery

## 2016-11-24 ENCOUNTER — Encounter (HOSPITAL_BASED_OUTPATIENT_CLINIC_OR_DEPARTMENT_OTHER): Payer: Self-pay | Admitting: Certified Registered"

## 2016-11-24 ENCOUNTER — Ambulatory Visit (HOSPITAL_BASED_OUTPATIENT_CLINIC_OR_DEPARTMENT_OTHER)
Admission: RE | Admit: 2016-11-24 | Discharge: 2016-11-25 | Disposition: A | Discharge status: Home / Self Care | Payer: Medicaid Other | Source: Ambulatory Visit | Attending: Orthopedic Surgery | Admitting: Orthopedic Surgery

## 2016-11-24 ENCOUNTER — Other Ambulatory Visit: Payer: Self-pay | Admitting: *Deleted

## 2016-11-24 DIAGNOSIS — Z7982 Long term (current) use of aspirin: Secondary | ICD-10-CM | POA: Insufficient documentation

## 2016-11-24 DIAGNOSIS — Z8614 Personal history of Methicillin resistant Staphylococcus aureus infection: Secondary | ICD-10-CM | POA: Diagnosis not present

## 2016-11-24 DIAGNOSIS — F329 Major depressive disorder, single episode, unspecified: Secondary | ICD-10-CM | POA: Insufficient documentation

## 2016-11-24 DIAGNOSIS — Z888 Allergy status to other drugs, medicaments and biological substances status: Secondary | ICD-10-CM | POA: Diagnosis not present

## 2016-11-24 DIAGNOSIS — M1990 Unspecified osteoarthritis, unspecified site: Secondary | ICD-10-CM | POA: Insufficient documentation

## 2016-11-24 DIAGNOSIS — D573 Sickle-cell trait: Secondary | ICD-10-CM | POA: Insufficient documentation

## 2016-11-24 DIAGNOSIS — I252 Old myocardial infarction: Secondary | ICD-10-CM | POA: Diagnosis not present

## 2016-11-24 DIAGNOSIS — E11610 Type 2 diabetes mellitus with diabetic neuropathic arthropathy: Secondary | ICD-10-CM | POA: Diagnosis not present

## 2016-11-24 DIAGNOSIS — Z87891 Personal history of nicotine dependence: Secondary | ICD-10-CM | POA: Diagnosis not present

## 2016-11-24 DIAGNOSIS — I2510 Atherosclerotic heart disease of native coronary artery without angina pectoris: Secondary | ICD-10-CM | POA: Diagnosis not present

## 2016-11-24 DIAGNOSIS — E785 Hyperlipidemia, unspecified: Secondary | ICD-10-CM | POA: Diagnosis not present

## 2016-11-24 DIAGNOSIS — Z7902 Long term (current) use of antithrombotics/antiplatelets: Secondary | ICD-10-CM | POA: Diagnosis not present

## 2016-11-24 DIAGNOSIS — E1151 Type 2 diabetes mellitus with diabetic peripheral angiopathy without gangrene: Secondary | ICD-10-CM | POA: Diagnosis not present

## 2016-11-24 DIAGNOSIS — I10 Essential (primary) hypertension: Secondary | ICD-10-CM | POA: Diagnosis not present

## 2016-11-24 DIAGNOSIS — Z794 Long term (current) use of insulin: Secondary | ICD-10-CM | POA: Diagnosis not present

## 2016-11-24 DIAGNOSIS — M6289 Other specified disorders of muscle: Secondary | ICD-10-CM | POA: Diagnosis not present

## 2016-11-24 DIAGNOSIS — J45909 Unspecified asthma, uncomplicated: Secondary | ICD-10-CM | POA: Insufficient documentation

## 2016-11-24 DIAGNOSIS — M6702 Short Achilles tendon (acquired), left ankle: Secondary | ICD-10-CM | POA: Diagnosis present

## 2016-11-24 HISTORY — PX: CALCANEAL OSTEOTOMY: SHX1281

## 2016-11-24 HISTORY — PX: ACHILLES TENDON LENGTHENING: SHX6455

## 2016-11-24 HISTORY — DX: Personal history of Methicillin resistant Staphylococcus aureus infection: Z86.14

## 2016-11-24 HISTORY — DX: Type 2 diabetes mellitus with diabetic neuropathy, unspecified: E11.40

## 2016-11-24 HISTORY — DX: Type 2 diabetes mellitus with diabetic neuropathic arthropathy: E11.610

## 2016-11-24 HISTORY — DX: Long term (current) use of insulin: Z79.4

## 2016-11-24 HISTORY — DX: Type 2 diabetes mellitus without complications: E11.9

## 2016-11-24 HISTORY — DX: Short Achilles tendon (acquired), left ankle: M67.02

## 2016-11-24 LAB — GLUCOSE, CAPILLARY
GLUCOSE-CAPILLARY: 193 mg/dL — AB (ref 65–99)
Glucose-Capillary: 239 mg/dL — ABNORMAL HIGH (ref 65–99)
Glucose-Capillary: 280 mg/dL — ABNORMAL HIGH (ref 65–99)

## 2016-11-24 LAB — POCT HEMOGLOBIN-HEMACUE: HEMOGLOBIN: 10.4 g/dL — AB (ref 12.0–15.0)

## 2016-11-24 SURGERY — LENGTHENING, TENDON, ACHILLES
Anesthesia: General | Site: Foot | Laterality: Left

## 2016-11-24 MED ORDER — OXYCODONE HCL 5 MG PO TABS: 5.0000 mg | ORAL_TABLET | ORAL | 0 refills | Status: DC | PRN

## 2016-11-24 MED ORDER — DOCUSATE SODIUM 100 MG PO CAPS: 100.0000 mg | ORAL_CAPSULE | Freq: Two times a day (BID) | ORAL | 0 refills | Status: DC

## 2016-11-24 MED ORDER — DOCUSATE SODIUM 100 MG PO CAPS
100.0000 mg | ORAL_CAPSULE | Freq: Two times a day (BID) | ORAL | Status: DC
  Administered 2016-11-24: 100 mg via ORAL
  Filled 2016-11-24: qty 1

## 2016-11-24 MED ORDER — MORPHINE SULFATE (PF) 2 MG/ML IV SOLN
2.0000 mg | INTRAVENOUS | Status: DC | PRN
  Administered 2016-11-24: 2 mg via INTRAVENOUS

## 2016-11-24 MED ORDER — ONDANSETRON HCL 4 MG PO TABS: 4.0000 mg | ORAL_TABLET | Freq: Four times a day (QID) | ORAL | Status: DC | PRN

## 2016-11-24 MED ORDER — INSULIN ASPART 100 UNIT/ML ~~LOC~~ SOLN
0.0000 [IU] | Freq: Three times a day (TID) | SUBCUTANEOUS | Status: DC
  Administered 2016-11-24: 8 [IU] via SUBCUTANEOUS
  Administered 2016-11-24: 11 [IU] via SUBCUTANEOUS
  Administered 2016-11-25: 5 [IU] via SUBCUTANEOUS
  Filled 2016-11-24 (×3): qty 1

## 2016-11-24 MED ORDER — LABETALOL HCL 5 MG/ML IV SOLN
5.0000 mg | INTRAVENOUS | Status: DC | PRN
  Administered 2016-11-24: 5 mg via INTRAVENOUS

## 2016-11-24 MED ORDER — MORPHINE SULFATE (PF) 4 MG/ML IV SOLN
INTRAVENOUS | Status: AC
  Filled 2016-11-24: qty 1

## 2016-11-24 MED ORDER — EPHEDRINE SULFATE 50 MG/ML IJ SOLN
INTRAMUSCULAR | Status: DC | PRN
  Administered 2016-11-24: 10 mg via INTRAVENOUS

## 2016-11-24 MED ORDER — MIDAZOLAM HCL 2 MG/2ML IJ SOLN
1.0000 mg | INTRAMUSCULAR | Status: DC | PRN
  Administered 2016-11-24: 2 mg via INTRAVENOUS

## 2016-11-24 MED ORDER — PHENYLEPHRINE HCL 10 MG/ML IJ SOLN
INTRAVENOUS | Status: DC | PRN
  Administered 2016-11-24: 40 ug/min via INTRAVENOUS

## 2016-11-24 MED ORDER — METOCLOPRAMIDE HCL 5 MG/ML IJ SOLN
5.0000 mg | Freq: Three times a day (TID) | INTRAMUSCULAR | Status: DC | PRN
  Administered 2016-11-24: 10 mg via INTRAVENOUS
  Filled 2016-11-24: qty 2

## 2016-11-24 MED ORDER — LIDOCAINE HCL (CARDIAC) 20 MG/ML IV SOLN
INTRAVENOUS | Status: DC | PRN
  Administered 2016-11-24: 20 mg via INTRAVENOUS

## 2016-11-24 MED ORDER — VANCOMYCIN HCL 500 MG IV SOLR
INTRAVENOUS | Status: DC | PRN
  Administered 2016-11-24: 500 mg

## 2016-11-24 MED ORDER — ONDANSETRON HCL 4 MG/2ML IJ SOLN: 4.0000 mg | Freq: Four times a day (QID) | INTRAMUSCULAR | Status: DC | PRN

## 2016-11-24 MED ORDER — ASPIRIN EC 81 MG PO TBEC: 81.0000 mg | DELAYED_RELEASE_TABLET | Freq: Two times a day (BID) | ORAL | 0 refills | Status: DC

## 2016-11-24 MED ORDER — VANCOMYCIN HCL IN DEXTROSE 1-5 GM/200ML-% IV SOLN
1000.0000 mg | Freq: Once | INTRAVENOUS | Status: AC
  Administered 2016-11-24 (×2): 1000 mg via INTRAVENOUS

## 2016-11-24 MED ORDER — ONDANSETRON HCL 4 MG/2ML IJ SOLN
INTRAMUSCULAR | Status: DC | PRN
  Administered 2016-11-24: 4 mg via INTRAVENOUS

## 2016-11-24 MED ORDER — PROPOFOL 10 MG/ML IV BOLUS
INTRAVENOUS | Status: DC | PRN
Start: 2016-11-24 — End: 2016-11-24
  Administered 2016-11-24: 200 mg via INTRAVENOUS

## 2016-11-24 MED ORDER — METOCLOPRAMIDE HCL 5 MG PO TABS: 5.0000 mg | ORAL_TABLET | Freq: Three times a day (TID) | ORAL | Status: DC | PRN

## 2016-11-24 MED ORDER — SCOPOLAMINE 1 MG/3DAYS TD PT72
1.0000 | MEDICATED_PATCH | Freq: Once | TRANSDERMAL | Status: DC | PRN
  Administered 2016-11-24: 1.5 mg via TRANSDERMAL

## 2016-11-24 MED ORDER — SODIUM CHLORIDE 0.9 % IV SOLN
INTRAVENOUS | Status: DC
  Administered 2016-11-24: 22:00:00 via INTRAVENOUS

## 2016-11-24 MED ORDER — OXYCODONE HCL 5 MG PO TABS
5.0000 mg | ORAL_TABLET | ORAL | Status: DC | PRN
  Administered 2016-11-24 – 2016-11-25 (×5): 10 mg via ORAL
  Filled 2016-11-24 (×5): qty 2

## 2016-11-24 MED ORDER — SENNA 8.6 MG PO TABS: 2.0000 | ORAL_TABLET | Freq: Two times a day (BID) | ORAL | 0 refills | Status: DC

## 2016-11-24 MED ORDER — BUPIVACAINE-EPINEPHRINE (PF) 0.5% -1:200000 IJ SOLN
INTRAMUSCULAR | Status: DC | PRN
  Administered 2016-11-24: 30 mL via PERINEURAL

## 2016-11-24 MED ORDER — LABETALOL HCL 5 MG/ML IV SOLN
INTRAVENOUS | Status: AC
  Filled 2016-11-24: qty 4

## 2016-11-24 MED ORDER — HYDRALAZINE HCL 20 MG/ML IJ SOLN
INTRAMUSCULAR | Status: DC | PRN
  Administered 2016-11-24: 5 mg via INTRAVENOUS

## 2016-11-24 MED ORDER — FENTANYL CITRATE (PF) 100 MCG/2ML IJ SOLN
INTRAMUSCULAR | Status: AC
  Filled 2016-11-24: qty 2

## 2016-11-24 MED ORDER — FENTANYL CITRATE (PF) 100 MCG/2ML IJ SOLN
50.0000 ug | INTRAMUSCULAR | Status: DC | PRN
  Administered 2016-11-24: 100 ug via INTRAVENOUS

## 2016-11-24 MED ORDER — 0.9 % SODIUM CHLORIDE (POUR BTL) OPTIME
TOPICAL | Status: DC | PRN
  Administered 2016-11-24: 1000 mL

## 2016-11-24 MED ORDER — CHLORHEXIDINE GLUCONATE 4 % EX LIQD: 60.0000 mL | Freq: Once | CUTANEOUS | Status: DC

## 2016-11-24 MED ORDER — CEFAZOLIN SODIUM-DEXTROSE 2-4 GM/100ML-% IV SOLN
INTRAVENOUS | Status: AC
  Filled 2016-11-24: qty 100

## 2016-11-24 MED ORDER — SODIUM CHLORIDE 0.9 % IV SOLN
INTRAVENOUS | Status: DC
  Administered 2016-11-24: 12:00:00 via INTRAVENOUS

## 2016-11-24 MED ORDER — BUPIVACAINE HCL (PF) 0.5 % IJ SOLN
INTRAMUSCULAR | Status: DC | PRN
  Administered 2016-11-24: 10 mL

## 2016-11-24 MED ORDER — DEXAMETHASONE SODIUM PHOSPHATE 4 MG/ML IJ SOLN
INTRAMUSCULAR | Status: DC | PRN
  Administered 2016-11-24: 4 mg via INTRAVENOUS

## 2016-11-24 MED ORDER — SCOPOLAMINE 1 MG/3DAYS TD PT72
MEDICATED_PATCH | TRANSDERMAL | Status: AC
  Filled 2016-11-24: qty 1

## 2016-11-24 MED ORDER — SENNA 8.6 MG PO TABS
1.0000 | ORAL_TABLET | Freq: Two times a day (BID) | ORAL | Status: DC
  Administered 2016-11-24: 8.6 mg via ORAL
  Filled 2016-11-24: qty 1

## 2016-11-24 MED ORDER — MIDAZOLAM HCL 2 MG/2ML IJ SOLN
INTRAMUSCULAR | Status: AC
  Filled 2016-11-24: qty 2

## 2016-11-24 MED ORDER — CEFAZOLIN SODIUM-DEXTROSE 2-4 GM/100ML-% IV SOLN
2.0000 g | INTRAVENOUS | Status: AC
  Administered 2016-11-24: 2 g via INTRAVENOUS

## 2016-11-24 MED ORDER — HYDROMORPHONE HCL 1 MG/ML IJ SOLN: 0.2500 mg | INTRAMUSCULAR | Status: DC | PRN

## 2016-11-24 MED ORDER — GELATIN ABSORBABLE 12-7 MM EX MISC
CUTANEOUS | Status: DC | PRN
  Administered 2016-11-24: 2

## 2016-11-24 MED ORDER — INSULIN ASPART 100 UNIT/ML ~~LOC~~ SOLN
18.0000 [IU] | Freq: Two times a day (BID) | SUBCUTANEOUS | Status: DC
  Administered 2016-11-24 – 2016-11-25 (×2): 18 [IU] via SUBCUTANEOUS
  Filled 2016-11-24 (×2): qty 1

## 2016-11-24 MED ORDER — LACTATED RINGERS IV SOLN
INTRAVENOUS | Status: DC
  Administered 2016-11-24 (×2): via INTRAVENOUS

## 2016-11-24 SURGICAL SUPPLY — 97 items
BANDAGE ESMARK 6X9 LF (GAUZE/BANDAGES/DRESSINGS) ×1 IMPLANT
BANDAGE GAUZE 4  KLING STR (GAUZE/BANDAGES/DRESSINGS) ×2 IMPLANT
BIT DRILL 2.5X2.75 QC CALB (BIT) ×2 IMPLANT
BIT DRILL 4.8X200 CANN (BIT) ×2 IMPLANT
BIT DRILL CALIBRATED 2.7 (BIT) ×2 IMPLANT
BLADE AVERAGE 25X9 (BLADE) IMPLANT
BLADE MICRO SAGITTAL (BLADE) IMPLANT
BLADE SAW SAG 29X58X.64 (BLADE) ×2 IMPLANT
BLADE SURG 15 STRL LF DISP TIS (BLADE) ×6 IMPLANT
BLADE SURG 15 STRL SS (BLADE) ×6
BNDG COHESIVE 4X5 TAN STRL (GAUZE/BANDAGES/DRESSINGS) ×2 IMPLANT
BNDG COHESIVE 6X5 TAN STRL LF (GAUZE/BANDAGES/DRESSINGS) ×2 IMPLANT
BNDG ESMARK 6X9 LF (GAUZE/BANDAGES/DRESSINGS) ×2
BUR EGG 3PK/BX (BURR) IMPLANT
CANISTER SUCT 1200ML W/VALVE (MISCELLANEOUS) ×2 IMPLANT
CHLORAPREP W/TINT 26ML (MISCELLANEOUS) ×2 IMPLANT
COVER BACK TABLE 60X90IN (DRAPES) ×2 IMPLANT
CUFF TOURNIQUET SINGLE 34IN LL (TOURNIQUET CUFF) ×2 IMPLANT
DRAPE EXTREMITY T 121X128X90 (DRAPE) ×2 IMPLANT
DRAPE OEC MINIVIEW 54X84 (DRAPES) ×2 IMPLANT
DRAPE U-SHAPE 47X51 STRL (DRAPES) ×2 IMPLANT
DRSG MEPITEL 4X7.2 (GAUZE/BANDAGES/DRESSINGS) ×2 IMPLANT
DRSG PAD ABDOMINAL 8X10 ST (GAUZE/BANDAGES/DRESSINGS) ×4 IMPLANT
ELECT REM PT RETURN 9FT ADLT (ELECTROSURGICAL) ×2
ELECTRODE REM PT RTRN 9FT ADLT (ELECTROSURGICAL) ×1 IMPLANT
GAUZE SPONGE 4X4 12PLY STRL (GAUZE/BANDAGES/DRESSINGS) ×2 IMPLANT
GLOVE BIO SURGEON STRL SZ8 (GLOVE) ×2 IMPLANT
GLOVE BIOGEL PI IND STRL 8 (GLOVE) ×2 IMPLANT
GLOVE BIOGEL PI INDICATOR 8 (GLOVE) ×2
GLOVE ECLIPSE 7.5 STRL STRAW (GLOVE) ×2 IMPLANT
GOWN STRL REUS W/ TWL LRG LVL3 (GOWN DISPOSABLE) ×1 IMPLANT
GOWN STRL REUS W/ TWL XL LVL3 (GOWN DISPOSABLE) ×2 IMPLANT
GOWN STRL REUS W/TWL LRG LVL3 (GOWN DISPOSABLE) ×1
GOWN STRL REUS W/TWL XL LVL3 (GOWN DISPOSABLE) ×2
K-WIRE ACE 1.6X6 (WIRE) ×4
KWIRE ACE 1.6X6 (WIRE) ×2 IMPLANT
NDL SUT 6 .5 CRC .975X.05 MAYO (NEEDLE) IMPLANT
NEEDLE HYPO 22GX1.5 SAFETY (NEEDLE) IMPLANT
NEEDLE HYPO 25X1 1.5 SAFETY (NEEDLE) IMPLANT
NEEDLE MAYO TAPER (NEEDLE)
NS IRRIG 1000ML POUR BTL (IV SOLUTION) ×2 IMPLANT
PACK BASIN DAY SURGERY FS (CUSTOM PROCEDURE TRAY) ×2 IMPLANT
PAD CAST 4YDX4 CTTN HI CHSV (CAST SUPPLIES) ×1 IMPLANT
PADDING CAST ABS 4INX4YD NS (CAST SUPPLIES)
PADDING CAST ABS COTTON 4X4 ST (CAST SUPPLIES) IMPLANT
PADDING CAST COTTON 4X4 STRL (CAST SUPPLIES) ×1
PADDING CAST COTTON 6X4 STRL (CAST SUPPLIES) ×2 IMPLANT
PENCIL BUTTON HOLSTER BLD 10FT (ELECTRODE) ×2 IMPLANT
PIN GUIDE DRILL TIP 2.8X300 (DRILL) ×4 IMPLANT
PLATE LOCK LFT MED FOOT (Plate) ×2 IMPLANT
PLATE LOCK SM LAT MIDFOOT (Plate) ×2 IMPLANT
SANITIZER HAND PURELL 535ML FO (MISCELLANEOUS) ×2 IMPLANT
SCREW CANC 6.5X22MMX110MM (Screw) ×2 IMPLANT
SCREW CANNULATED 8.0X110MM (Screw) ×2 IMPLANT
SCREW CORT 3.5X26 (Screw) ×1 IMPLANT
SCREW CORT 3.5X40 (Screw) ×2 IMPLANT
SCREW CORT T15 26X3.5XST LCK (Screw) ×1 IMPLANT
SCREW LOCK 3.5X20 DIST TIB (Screw) ×2 IMPLANT
SCREW LOCK 3.5X24 DIST TIB (Screw) ×2 IMPLANT
SCREW LOCK CANC HEX 6.5X80X40 (Screw) ×2 IMPLANT
SCREW LOCK CORT STAR 3.5X12 (Screw) ×2 IMPLANT
SCREW LOCK CORT STAR 3.5X30 (Screw) ×6 IMPLANT
SCREW LOCK CORT STAR 3.5X40 (Screw) ×2 IMPLANT
SCREW LOW PROFILE 22MMX3.5MM (Screw) ×2 IMPLANT
SCREW LP 3.5 (Screw) ×2 IMPLANT
SCREW LP 3.5X38MM (Screw) ×6 IMPLANT
SHEET MEDIUM DRAPE 40X70 STRL (DRAPES) ×2 IMPLANT
SLEEVE SCD COMPRESS KNEE MED (MISCELLANEOUS) ×2 IMPLANT
SPLINT FAST PLASTER 5X30 (CAST SUPPLIES) ×20
SPLINT PLASTER CAST FAST 5X30 (CAST SUPPLIES) ×20 IMPLANT
SPONGE LAP 18X18 X RAY DECT (DISPOSABLE) ×4 IMPLANT
STOCKINETTE 6  STRL (DRAPES) ×1
STOCKINETTE 6 STRL (DRAPES) ×1 IMPLANT
STRIP CLOSURE SKIN 1/2X4 (GAUZE/BANDAGES/DRESSINGS) IMPLANT
SUCTION FRAZIER HANDLE 10FR (MISCELLANEOUS) ×1
SUCTION TUBE FRAZIER 10FR DISP (MISCELLANEOUS) ×1 IMPLANT
SUT 2 FIBERLOOP 20 STRT BLUE (SUTURE)
SUT BONE WAX W31G (SUTURE) IMPLANT
SUT ETHIBOND 2 OS 4 DA (SUTURE) IMPLANT
SUT ETHILON 2 0 FS 18 (SUTURE) ×8 IMPLANT
SUT ETHILON 3 0 PS 1 (SUTURE) ×2 IMPLANT
SUT FIBERWIRE #2 38 T-5 BLUE (SUTURE)
SUT FIBERWIRE 2-0 18 17.9 3/8 (SUTURE)
SUT MNCRL AB 3-0 PS2 18 (SUTURE) ×2 IMPLANT
SUT VIC AB 0 SH 27 (SUTURE) ×2 IMPLANT
SUT VIC AB 2-0 SH 27 (SUTURE)
SUT VIC AB 2-0 SH 27XBRD (SUTURE) IMPLANT
SUTURE 2 FIBERLOOP 20 STRT BLU (SUTURE) IMPLANT
SUTURE FIBERWR #2 38 T-5 BLUE (SUTURE) IMPLANT
SUTURE FIBERWR 2-0 18 17.9 3/8 (SUTURE) IMPLANT
SYR BULB 3OZ (MISCELLANEOUS) ×2 IMPLANT
SYR CONTROL 10ML LL (SYRINGE) IMPLANT
TOWEL OR 17X24 6PK STRL BLUE (TOWEL DISPOSABLE) ×4 IMPLANT
TUBE CONNECTING 20X1/4 (TUBING) ×2 IMPLANT
UNDERPAD 30X30 (UNDERPADS AND DIAPERS) ×2 IMPLANT
WASHER ACECAN 6.5 (Washer) ×2 IMPLANT
YANKAUER SUCT BULB TIP NO VENT (SUCTIONS) IMPLANT

## 2016-11-24 NOTE — Anesthesia Postprocedure Evaluation (Signed)
Anesthesia Post Note  Patient: Jane [name]  Procedure(s) Performed: Procedure(s) (LRB): Left Achilles tendon lengthening (open) (Left) Left hindfoot osteotomy and fusion (Left)  Patient location during evaluation: PACU Anesthesia Type: General and Regional Level of consciousness: awake and alert Pain management: pain level controlled Vital Signs Assessment: post-procedure vital signs reviewed and stable Respiratory status: spontaneous breathing, nonlabored ventilation and respiratory function stable Cardiovascular status: blood pressure returned to baseline and stable Postop Assessment: no signs of nausea or vomiting Anesthetic complications: no       Last Vitals:  Vitals:   11/24/16 1045 11/24/16 1100  BP: (!) 180/99 (!) 187/99  Pulse: 93 (!) 101  Resp: 17 16  Temp:      Last Pain:  Vitals:   11/24/16 1100  TempSrc:   PainSc: 0-No pain                 Aeryn Medici,W. EDMOND

## 2016-11-24 NOTE — Brief Op Note (Signed)
11/24/2016  10:29 AM  PATIENT:  Jane [name]  40 y.o. 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  Left Achilles tendon percutaneous lengthening 2.  Left hindfoot osteotomy 3.  Left hindfoot / midfoot arthrodesis 4.  Left foot ap, oblique and lateral xrays  SURGEON:  Wylene Simmer, MD  ASSISTANT:  Mechele Claude, PA-C  ANESTHESIA:   General, regional  EBL:  minimal   TOURNIQUET:   Total Tourniquet Time Documented: Thigh (Left) - 117 minutes Total: Thigh (Left) - 112 minutes  COMPLICATIONS:  None apparent  DISPOSITION:  Extubated, awake and stable to recovery.  DICTATION ID:   [id number]

## 2016-11-24 NOTE — Anesthesia Procedure Notes (Addendum)
Anesthesia Regional Block:  Popliteal block  Pre-Anesthetic Checklist: ,, timeout performed, Correct Patient, Correct Site, Correct Laterality, Correct Procedure, Correct Position, site marked, Risks and benefits discussed, pre-op evaluation,  At surgeon's request and post-op pain management  Laterality: Left  Prep: Maximum Sterile Barrier Precautions used, chloraprep       Needles:  Injection technique: Single-shot  Needle Type: Echogenic Stimulator Needle     Needle Length: 9cm 9 cm Needle Gauge: 21 and 21 G    Additional Needles:  Procedures: ultrasound guided (picture in chart) and nerve stimulator Popliteal block  Nerve Stimulator or Paresthesia:  Response: Peroneal,  Response: Tibial,   Additional Responses:   Narrative:  Start time: 11/24/2016 7:15 AM End time: 11/24/2016 7:25 AM Injection made incrementally with aspirations every 5 mL. Anesthesiologist: Roderic Palau  Additional Notes: 2% Lidocaine skin wheel. Saphenous block with 10cc of 0.5% Bupivicaine plain.

## 2016-11-24 NOTE — Addendum Note (Signed)
Addendum  created 11/24/16 1223 by Roderic Palau, MD   Order list changed

## 2016-11-24 NOTE — Anesthesia Preprocedure Evaluation (Addendum)
Anesthesia Evaluation  Patient identified by MRN, date of birth, ID band Patient awake    Reviewed: Allergy & Precautions, H&P , NPO status , Patient's Chart, lab work & pertinent test results, reviewed documented beta blocker date and time   Airway Mallampati: III  TM Distance: >3 FB Neck ROM: Full    Dental no notable dental hx. (+) Teeth Intact, Dental Advisory Given   Pulmonary asthma , former smoker,    Pulmonary exam normal breath sounds clear to auscultation       Cardiovascular hypertension, Pt. on medications and Pt. on home beta blockers + CAD, + Cardiac Stents and + Peripheral Vascular Disease   Rhythm:Regular Rate:Normal     Neuro/Psych Depression negative neurological ROS  negative psych ROS   GI/Hepatic negative GI ROS, Neg liver ROS,   Endo/Other  diabetes, Insulin Dependent  Renal/GU Renal InsufficiencyRenal disease  negative genitourinary   Musculoskeletal  (+) Arthritis , Osteoarthritis,    Abdominal   Peds  Hematology negative hematology ROS (+)   Anesthesia Other Findings   Reproductive/Obstetrics negative OB ROS                            Anesthesia Physical Anesthesia Plan  ASA: III  Anesthesia Plan: General   Post-op Pain Management:  Regional for Post-op pain   Induction: Intravenous  Airway Management Planned: LMA  Additional Equipment:   Intra-op Plan:   Post-operative Plan: Extubation in OR  Informed Consent: I have reviewed the patients History and Physical, chart, labs and discussed the procedure including the risks, benefits and alternatives for the proposed anesthesia with the patient or authorized representative who has indicated his/her understanding and acceptance.   Dental advisory given  Plan Discussed with: CRNA  Anesthesia Plan Comments:        Anesthesia Quick Evaluation

## 2016-11-24 NOTE — H&P (Signed)
Jane [name] is an 41 y.o. female.   Chief Complaint: left charcot foot HPI: 41 y/o female with PMH of diabetes c/o progressive left foot deformity over the last year.  She has failed non op treatment and presents now for operative stabilization of the left foot as well as heel cord lengthening.  Past Medical History:  Diagnosis Date  . Asthma    prn inhaler  . CAD S/P BMS PCI to prox LAD 03/31/2016   Ost LAD to Mid LAD lesion, 90% stenosed. Post intervention - Vision BMS 3.0 mm x 18 mm (~3.5 mm) there is a 0% residual stenosis.   . Charcot foot due to diabetes mellitus ([place]) 11/2016   left  . Depression   . Diabetic neuropathy (HCC)    feet  . H/O non-ST elevation myocardial infarction (NSTEMI) 03/2016   Found in 9% mid LAD lesion treated with bare-metal stent (BMS) PCI - vision BMS 3.0 mm x 18 mm  . History of MRSA infection   . Hyperlipidemia   . Hypertension    medication dose increased 11/22/2015; has been taking med. consistently since 03/2016, per pt.  . Insulin dependent diabetes mellitus ([place])   . Iron deficiency anemia    takes iron supplement  . Retinopathy of both eyes   . Sickle cell trait ([place])   . 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 CARDIAC CATHETERIZATION N/A 03/31/2016   Procedure: Left Heart Cath and Coronary Angiography;  Surgeon: Lorretta Harp, MD;  Location: [facility] INVASIVE CV LAB: 90% early mLAD, normal LV Fxn  . CARDIAC CATHETERIZATION N/A 03/31/2016   Procedure: Coronary Stent Intervention;  Surgeon: Lorretta Harp, MD;  Location: [facility] CV LAB;  Service: Cardiovascular: PCI to mLAD BMS Vision 3.0 mm x 18 mm  . CESAREAN SECTION  1997  . TUBAL LIGATION  1997    Family History  Problem Relation Age of Onset  . Stroke Mother   . Diabetes Mother   . Hypertension Mother   . Aneurysm Mother   . Diabetes Father   . Hypertension Father   . Diabetes Sister   . Hypertension Sister   .  Diabetes Maternal Grandmother   . Diabetes Maternal Grandfather   . Cancer Paternal Grandfather     Prostate   Social History:  reports that she quit smoking about 14 months ago. She smoked 0.00 packs per day for 0.00 years. She has never used smokeless tobacco. She reports that she does not drink alcohol or use drugs.  Allergies:  Allergies  Allergen Reactions  . Adhesive [Tape] Other (See Comments)    Irritation     Medications Prior to Admission  Medication Sig Dispense Refill  . acetaminophen (TYLENOL) 325 MG tablet Take 650 mg by mouth every 6 (six) hours as needed.    . ASPIR-LOW 81 MG EC tablet TAKE 1 Tablet BY MOUTH ONCE DAILY 90 tablet 2  . atorvastatin (LIPITOR) 40 MG tablet TAKE 1 Tablet BY MOUTH EVERY EVENING AT 6 PM 90 tablet 3  . carvedilol (COREG) 12.5 MG tablet Take 12.5 mg by mouth 2 (two) times daily with a meal.    . cetirizine (ZYRTEC) 10 MG tablet Take 1 tablet (10 mg total) by mouth daily. 30 tablet 0  . clopidogrel (PLAVIX) 75 MG tablet Take 1 tablet (75 mg total) by mouth daily. Internal Medicine Program 30 tablet 1  . ferrous sulfate 325 (65 FE) MG  tablet TAKE 1 Tablet BY MOUTH EVERY MORNING WITH BREAKFAST 90 tablet 3  . HUMULIN 70/30 (70-30) 100 UNIT/ML injection INJECT 18 UNITS UNDER THE SKIN TWICE DAILY WITH MEALS (EACH VIAL IS GOOD FOR 28 DAYS ONCE OPENED) 10 mL 11  . lisinopril (PRINIVIL,ZESTRIL) 40 MG tablet Take 1 tablet (40 mg total) by mouth daily. 90 tablet 3  . megestrol (MEGACE) 40 MG tablet Take 1 tablet (40 mg total) by mouth daily. Internal Medicine Program 60 tablet 3  . ACCU-CHEK FASTCLIX LANCETS MISC Check blood sugar 3x a day  as instructed 102 each 12  . Blood Glucose Monitoring Suppl (ACCU-CHEK GUIDE) w/Device KIT 1 each by Does not apply route 3 (three) times daily. 1 kit 0  . exenatide (BYETTA 5 MCG PEN) 5 MCG/0.02ML SOPN injection Inject 0.02 mLs (5 mcg total) into the skin 2 (two) times daily with a meal. 1.2 mL 11  . glucose blood  (ACCU-CHEK GUIDE) test strip Check blood sugar 3x a day  as instructed 100 each 12  . Insulin Pen Needle (B-D UF III MINI PEN NEEDLES) 31G X 5 MM MISC Use for injection of Byetta as instructed. 60 each 2  . Insulin Syringe-Needle U-100 31G X 15/64" 0.3 ML MISC Use to inject insulin two times a day 100 each 12  . PROVENTIL HFA 108 (90 Base) MCG/ACT inhaler INHALE 2 PUFFS BY MOUTH EVERY 4 HOURS AS NEEDED FOR COUGHING, WHEEZING, OR SHORTNESS OF BREATH 18 g 0    Results for orders placed or performed during the hospital encounter of 12/15/2016 (from the past 48 hour(s))  Hemoglobin-hemacue, POC     Status: Abnormal   Collection Time: 2016-12-15  7:03 AM  Result Value Ref Range   Hemoglobin 10.4 (L) 12.0 - 15.0 g/dL  Glucose, capillary     Status: Abnormal   Collection Time: 2016/12/15  7:07 AM  Result Value Ref Range   Glucose-Capillary 193 (H) 65 - 99 mg/dL   No results found.  ROS  No recent f/c/n/v/wt loss  Blood pressure (!) 198/85, pulse 98, temperature 98 F (36.7 C), temperature source Oral, resp. rate 18, height '5\' 5"'$  (1.651 m), weight 105.9 kg (233 lb 6 oz), SpO2 100 %. Physical Exam  wn wd woman in nad.  A and O x 4.  Mood and affect normal.  EOMi.  resp unlabored.  L foot with swelling dorsally.  No ulcers.  Brisk cap refill at the toes.  Sens to LT significantly diminished at the forefoot.  Heelcord is tight.  Assessment/Plan L charcot foot deformity and tight heelcord.  To OR for heelcord lengthening, osteotomy and midfoot arthodesis.  The risks and benefits of the alternative treatment options have been discussed in detail.  The patient wishes to proceed with surgery and specifically understands risks of bleeding, infection, nerve damage, blood clots, need for additional surgery, amputation and death.   Wylene Simmer, MD 12-15-16, 7:19 AM

## 2016-11-24 NOTE — Discharge Instructions (Signed)
Jane Simmer, MD [place]  Please read the following information regarding your care after surgery.  Medications  You only need a prescription for the narcotic pain medicine (ex. oxycodone, Percocet, Norco).  All of the other medicines listed below are available over the counter. X acetominophen (Tylenol) 650 mg every 4-6 hours as you need for minor pain X oxycodone as prescribed for moderate to severe pain X aleve 220 mg - 2 tablets twice daily with food as needed for pain.   Narcotic pain medicine (ex. oxycodone, Percocet, Vicodin) will cause constipation.  To prevent this problem, take the following medicines while you are taking any pain medicine. X docusate sodium (Colace) 100 mg twice a day X senna (Senokot) 2 tablets twice a day  X To help prevent blood clots, take a baby aspirin (81 mg) twice a day after surgery.  You should also get up every hour while you are awake to move around.    Weight Bearing X Do not bear any weight on the operated leg or foot.  Cast / Splint / Dressing X Keep your splint or cast clean and dry.  Dont put anything (coat hanger, pencil, etc) down inside of it.  If it gets damp, use a hair dryer on the cool setting to dry it.  If it gets soaked, call the office to schedule an appointment for a cast change.  After your dressing, cast or splint is removed; you may shower, but do not soak or scrub the wound.  Allow the water to run over it, and then gently pat it dry.  Swelling It is normal for you to have swelling where you had surgery.  To reduce swelling and pain, keep your toes above your nose for at least 3 days after surgery.  It may be necessary to keep your foot or leg elevated for several weeks.  If it hurts, it should be elevated.  Follow Up Call my office at [phone] when you are discharged from the hospital or surgery center to schedule an appointment to be seen two weeks after surgery.  Call my office at [phone] if you  develop a fever >101.5 F, nausea, vomiting, bleeding from the surgical site or severe pain.

## 2016-11-24 NOTE — Anesthesia Procedure Notes (Signed)
Procedure Name: LMA Insertion Date/Time: 11/24/2016 7:40 AM Performed by: Duran Ohern D Pre-anesthesia Checklist: Patient identified, Emergency Drugs available, Suction available and Patient being monitored Patient Re-evaluated:Patient Re-evaluated prior to inductionOxygen Delivery Method: Circle system utilized Preoxygenation: Pre-oxygenation with 100% oxygen Intubation Type: IV induction Ventilation: Mask ventilation without difficulty LMA: LMA inserted LMA Size: 4.0 Number of attempts: 1 Airway Equipment and Method: Bite block Placement Confirmation: positive ETCO2 Tube secured with: Tape Dental Injury: Teeth and Oropharynx as per pre-operative assessment

## 2016-11-24 NOTE — Progress Notes (Signed)
Assisted Dr. Oren Bracket with left, ultrasound guided, popliteal/saphenous block. Side rails up, monitors on throughout procedure. See vital signs in flow sheet. Tolerated Procedure well.

## 2016-11-24 NOTE — Transfer of Care (Signed)
Immediate Anesthesia Transfer of Care Note  Patient: TEIGEN PARSLOW  Procedure(s) Performed: Procedure(s): Left Achilles tendon lengthening (open) (Left) Left hindfoot osteotomy and fusion (Left)  Patient Location: PACU  Anesthesia Type:GA combined with regional for post-op pain  Level of Consciousness: awake, alert , oriented and patient cooperative  Airway & Oxygen Therapy: Patient Spontanous Breathing and Patient connected to face mask oxygen  Post-op Assessment: Report given to RN and Post -op Vital signs reviewed and stable  Post vital signs: Reviewed and stable  Last Vitals:  Vitals:   11/24/16 0725 11/24/16 1019  BP: (!) 191/103   Pulse: 92   Resp: 16   Temp:  36.4 C    Last Pain:  Vitals:   11/24/16 0646  TempSrc: Oral  PainSc: 8       Patients Stated Pain Goal: 4 (15/40/08 6761)  Complications: No apparent anesthesia complications

## 2016-11-25 ENCOUNTER — Encounter (HOSPITAL_BASED_OUTPATIENT_CLINIC_OR_DEPARTMENT_OTHER): Payer: Self-pay | Admitting: Orthopedic Surgery

## 2016-11-25 DIAGNOSIS — E11610 Type 2 diabetes mellitus with diabetic neuropathic arthropathy: Secondary | ICD-10-CM | POA: Diagnosis not present

## 2016-11-25 LAB — GLUCOSE, CAPILLARY
GLUCOSE-CAPILLARY: 325 mg/dL — AB (ref 65–99)
Glucose-Capillary: 203 mg/dL — ABNORMAL HIGH (ref 65–99)
Glucose-Capillary: 214 mg/dL — ABNORMAL HIGH (ref 65–99)

## 2016-11-25 MED FILL — oxyCODONE HCL 5 MG TABS: 5 | 3 days supply | Qty: 30 | Fill #0

## 2016-11-25 NOTE — Op Note (Signed)
NAME:  Jane [name], Jane [name]                ACCOUNT NO.:  MEDICAL RECORD NO.:  [id number]  LOCATION:                                 FACILITY:  PHYSICIAN:  Wylene Simmer, MD        DATE OF BIRTH:  09/09/1976  DATE OF PROCEDURE:  11/24/2016 DATE OF DISCHARGE:                              OPERATIVE REPORT   PREOPERATIVE DIAGNOSES: 1. Tight left heel cord. 2. Left Charcot deformity involving the midfoot and hindfoot.  POSTOPERATIVE DIAGNOSES: 1. Tight left heel cord. 2. Left Charcot deformity involving the midfoot and hindfoot.  PROCEDURES: 1. Left Achilles tendon percutaneous lengthening. 2. Left hindfoot osteotomy. 3. Left hindfoot and midfoot arthrodeses. 4. Left foot AP, oblique, and lateral radiographs.  SURGEON:  Wylene Simmer, MD.  ASSISTANT:  Mechele Claude, PA-C.  ANESTHESIA:  General, regional.  ESTIMATED BLOOD LOSS:  Minimal.  TOURNIQUET TIME:  117 minutes at 300 mmHg.  COMPLICATIONS:  None apparent.  DISPOSITION:  Extubated, awake and stable to recovery.  INDICATIONS FOR PROCEDURE:  The patient is a 41 year old woman with past medical history significant for poorly-controlled diabetes.  She developed a left foot Charcot deformity approximately a year ago.  She has failed nonoperative treatment.  Radiographs show complete dissociation of the forefoot and midfoot from the hindfoot through the calcaneocuboid and talonavicular joints.  She presents today for operative treatment of this unstable midfoot and hindfoot deformity. She understands the risks and benefits of the alternative treatment options and elects surgical treatment.  She specifically understands risks of bleeding, infection, nerve damage, blood clots, need for additional surgery, continued pain, nonunion, recurrence of her deformity, amputation and death.  PROCEDURE IN DETAIL:  After preoperative consent was obtained and the correct operative site was identified, the patient was brought to  the operating room and placed supine on the operating table.  General anesthesia was induced.  Preoperative antibiotics were administered. Surgical time-out was taken.  Left lower extremity was then prepped and draped in standard sterile fashion with a tourniquet around the thigh. The extremity was exsanguinated and the tourniquet was inflated to 300 mmHg.  A percutaneous heel cord lengthening was then performed using the triple hemisection technique.  The ankle was noted to dorsiflex appropriately.  Attention was then turned to the medial aspect of the foot where a longitudinal incision was made.  Sharp dissection was carried down through the skin and subcutaneous tissue.  Posterior tibial tendon was identified.  Full thickness sleeves of tissue were developed with subperiosteal dissection exposing the area of the Charcot deformity as well as the medial talus, the remnants of the medial navicular and the medial cuneiform as well as the medial aspect of the first metatarsal. Subperiosteal dissection was carried dorsally and plantarly connecting the full-thickness leaves of soft tissue to the medial incision.  A counterincision was made on the lateral aspect of the foot. Dissection was again carried down through the skin and subcutaneous tissue creating full-thickness soft tissue flaps dorsally and plantarly. At this point, K-wires were placed across the calcaneus and talus just proximal from the subchondral bone and just distal to the articular cartilage and subchondral bone of the midfoot.  AP and  lateral radiographs confirmed appropriate position of both guidepins.  The soft tissue sleeves were protected plantarly and dorsally.  The oscillating saw was used to resect the articular cartilage and subchondral bone using the K-wires as a guide.  K-wires were removed.  All the waste bone was removed from the site of the osteotomy.  The cuts were beveled appropriately to correct the  angular malalignment.  The wound was then irrigated copiously.  The foot was reduced and pinned.  The pins were from the Biomet 6.5/8.0 screw set.  AP and lateral radiographs confirmed appropriate alignment of the arthrodesis site.  The lateral guidepin was overdrilled and a solid 6.5-mm partially-threaded screw was inserted with a washer.  It was inserted from the fifth metatarsal base and cuboid to the calcaneus.  The screw was tightened and was noted to compress the arthrodesis site appropriately.  The medial screw was positioned through the head of the first metatarsal and along the first metatarsal shaft into the talus.  The screw was overdrilled and an 8-mm cannulated partially-threaded screw was inserted.  It was noted to compress the arthrodesis site appropriately.  The head was countersunk deep to the articular surface of the first metatarsal head.  An 8-hole dorsal midfoot fusion plate was selected from the Biomet ALPS set.  It was applied dorsolaterally over the calcaneocuboid arthrodesis site.  It was secured proximally and distally with combination of locking and nonlocking screws.  Medially, a Biomet ALPS Lisfranc fusion plate was selected.  It was provisionally pinned.  Radiographs confirmed appropriate alignment of the plate.  It was secured to the talus proximally with locking and nonlocking screws and to the first metatarsal and medial cuneiform distally again with locking and nonlocking screws.  Final AP, oblique and lateral radiographs confirmed appropriate position and length of all hardware and appropriate alignment of the arthrodesis site.  Both wounds were then irrigated copiously.  Gelfoam was placed along the bleeding bony surfaces.  The sleeves of soft tissue were approximated with inverted simple sutures of 0 Vicryl.  Skin incisions were closed with horizontal mattress sutures of 2-0 nylon.  Sterile dressings were applied followed by well-padded short-leg  splint.  Tourniquet had been released at approximately 2 hours prior to closure.  Hemostasis was achieved prior to closure.  The patient was then awakened from anesthesia and transported to the recovery room in stable condition.  FOLLOWUP PLAN:  The patient will be nonweightbearing on her left lower extremity.  She will follow up with me in the office in 2 weeks for suture removal and conversion to a short-leg cast.  We will plan total period of nonweightbearing immobilization of 3 months and then plan to get her into a CROW boot after that.  Aspirin for DVT prophylaxis. Mechele Claude PA-C, was present and scrubbed for the duration of the case.  His assistance was essential in positioning the patient, prepping and draping, gaining and maintaining exposure, performing the operation, closing and dressing of the wounds and applying the splint.  RADIOGRAPHS:  AP, oblique and lateral radiographs of the left foot were obtained intraoperatively.  These show interval resection of Charcot bony destruction and arthrodesis across the talonavicular and calcaneocuboid joints.     Wylene Simmer, MD     JH/MEDQ  D:  11/24/2016  T:  11/25/2016  Job:  [id number]

## 2016-12-05 ENCOUNTER — Other Ambulatory Visit: Payer: Self-pay | Admitting: Internal Medicine

## 2016-12-05 ENCOUNTER — Encounter: Payer: Self-pay | Admitting: Dietician

## 2016-12-05 ENCOUNTER — Ambulatory Visit: Payer: Self-pay

## 2016-12-05 NOTE — Telephone Encounter (Signed)
Received medication refill for coreg 6.25mg  tabs take one tab twice a day #60.  Per 11/21/16 office visit-medication was increased to coreg 12.5mg  tabs twice daily-will confirm dose with prescribing MD and confirm that pharmacy received rx.  Please advise. Regenia Skeeter, Loretta Doutt Cassady1/22/20189:01 AM

## 2016-12-05 NOTE — Telephone Encounter (Signed)
Yes, increase to Coreg 12.5 mg bid

## 2016-12-06 ENCOUNTER — Telehealth: Payer: Self-pay | Admitting: Internal Medicine

## 2016-12-06 NOTE — Telephone Encounter (Signed)
APT. REMINDER CALL, LMTCB °

## 2016-12-07 ENCOUNTER — Ambulatory Visit (INDEPENDENT_AMBULATORY_CARE_PROVIDER_SITE_OTHER): Payer: Medicaid Other | Admitting: Internal Medicine

## 2016-12-07 ENCOUNTER — Encounter: Payer: Self-pay | Admitting: Internal Medicine

## 2016-12-07 ENCOUNTER — Other Ambulatory Visit: Payer: Self-pay | Admitting: Pharmacist

## 2016-12-07 ENCOUNTER — Ambulatory Visit: Payer: Medicaid Other | Admitting: Dietician

## 2016-12-07 VITALS — BP 189/87 | HR 89 | Temp 98.0°F | Wt 227.6 lb

## 2016-12-07 DIAGNOSIS — N183 Chronic kidney disease, stage 3 (moderate): Secondary | ICD-10-CM | POA: Diagnosis not present

## 2016-12-07 DIAGNOSIS — E113493 Type 2 diabetes mellitus with severe nonproliferative diabetic retinopathy without macular edema, bilateral: Secondary | ICD-10-CM

## 2016-12-07 DIAGNOSIS — I10 Essential (primary) hypertension: Secondary | ICD-10-CM

## 2016-12-07 DIAGNOSIS — E1122 Type 2 diabetes mellitus with diabetic chronic kidney disease: Secondary | ICD-10-CM | POA: Diagnosis present

## 2016-12-07 DIAGNOSIS — Z79899 Other long term (current) drug therapy: Secondary | ICD-10-CM

## 2016-12-07 DIAGNOSIS — I129 Hypertensive chronic kidney disease with stage 1 through stage 4 chronic kidney disease, or unspecified chronic kidney disease: Secondary | ICD-10-CM | POA: Diagnosis not present

## 2016-12-07 DIAGNOSIS — Z87891 Personal history of nicotine dependence: Secondary | ICD-10-CM

## 2016-12-07 DIAGNOSIS — Z794 Long term (current) use of insulin: Secondary | ICD-10-CM

## 2016-12-07 DIAGNOSIS — Z8249 Family history of ischemic heart disease and other diseases of the circulatory system: Secondary | ICD-10-CM

## 2016-12-07 DIAGNOSIS — Z8042 Family history of malignant neoplasm of prostate: Secondary | ICD-10-CM | POA: Diagnosis not present

## 2016-12-07 DIAGNOSIS — E0821 Diabetes mellitus due to underlying condition with diabetic nephropathy: Secondary | ICD-10-CM

## 2016-12-07 DIAGNOSIS — Z833 Family history of diabetes mellitus: Secondary | ICD-10-CM | POA: Diagnosis not present

## 2016-12-07 DIAGNOSIS — Z823 Family history of stroke: Secondary | ICD-10-CM | POA: Diagnosis not present

## 2016-12-07 MED ORDER — INSULIN PEN NEEDLE 31G X 5 MM MISC: 2 refills | Status: DC

## 2016-12-07 MED ORDER — LIRAGLUTIDE 18 MG/3ML ~~LOC~~ SOPN: 0.6000 mg | PEN_INJECTOR | Freq: Every day | SUBCUTANEOUS | 2 refills | Status: DC

## 2016-12-07 MED ORDER — LISINOPRIL 40 MG PO TABS: 40.0000 mg | ORAL_TABLET | Freq: Every day | ORAL | 3 refills | Status: DC

## 2016-12-07 MED ORDER — CARVEDILOL 12.5 MG PO TABS: 12.5000 mg | ORAL_TABLET | Freq: Two times a day (BID) | ORAL | 2 refills | Status: DC

## 2016-12-07 NOTE — Assessment & Plan Note (Addendum)
Patient was noted to have all abated blood pressure on her examination several weeks ago. At that time her metoprolol was transitioned to carvedilol 6.25 mg which was then increased to 12.5 mg twice a day at her last visit on 11/21/2016. She is also recently been started on lisinopril 20 mg. Dr. Charlynn Grimes recommended that we increase her lisinopril 20 mg to 40 mg at her visit 2 weeks ago, however, BMP which was done demonstrated decline in her renal function. Patient was instructed to maintain her 20 mg dose lisinopril at this time until further evaluation could be completed.  Patient reports that she has run out of her carvedilol since yesterday and has not taken her medication today. She is unable to afford this medication until she gets her paycheck in 2 weeks. We will provide a sample for her today with the assistance of pharmacy. We will also send a new prescriptions medication the [facility] where she will receive this medication free of discharge.  We will repeat BMP today to follow kidney function as her decline may be related to multiple factors. She has a history of significant proteinuria on prior urinalysis, and I feel that her nephropathy is likely due to uncontrolled hypertension and diabetes. On 10/12/16, patient's lisinopril was increased from 10 mg to 20 mg. At that time her BMP demonstrated serum creatinine of 1.67. BMP checked on 11/21/2016 demonstrates serum creatinine of 1.79. This increased represents a less than 20% increase from her baseline. I would recommend maximizing her ACE inhibitor to 40 mg today. We will plan further workup for her CK D stage III as noted below.   Plan: Continue carvedilol 12.5 mg twice a day Continue lisinopril 20 mg daily Check BMP for renal function today Return to clinic in 4 weeks for blood pressure check.

## 2016-12-07 NOTE — Progress Notes (Signed)
CC: HTN and DM HPI: [name] is a 41 y.o. female with a h/o of t2DM, HTN, HLD, CAD, peripheral neuropathy who presents for f/u evaluation and mangement of BP and BG.  Please see Problem-based charting for HPI and the status of patient's chronic medical conditions.  Past Medical History:  Diagnosis Date  . Asthma    prn inhaler  . CAD S/P BMS PCI to prox LAD 03/31/2016   Ost LAD to Mid LAD lesion, 90% stenosed. Post intervention - Vision BMS 3.0 mm x 18 mm (~3.5 mm) there is a 0% residual stenosis.   . Charcot foot due to diabetes mellitus ([place]) 11/2016   left  . Depression   . Diabetic neuropathy (HCC)    feet  . H/O non-ST elevation myocardial infarction (NSTEMI) 03/2016   Found in 9% mid LAD lesion treated with bare-metal stent (BMS) PCI - vision BMS 3.0 mm x 18 mm  . History of MRSA infection   . Hyperlipidemia   . Hypertension    medication dose increased 11/22/2015; has been taking med. consistently since 03/2016, per pt.  . Insulin dependent diabetes mellitus ([place])   . Iron deficiency anemia    takes iron supplement  . Retinopathy of both eyes   . Sickle cell trait ([place])   . 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 Smoking status: Former Smoker    Packs/day: 0.00    Years: 0.00    Quit date: 08/29/2015  . Smokeless tobacco: Never Used  . Alcohol use No   Family History  Problem Relation Age of Onset  . Stroke Mother   . Diabetes Mother   . Hypertension Mother   . Aneurysm Mother   . Diabetes Father   . Hypertension Father   . Diabetes Sister   . Hypertension Sister   . Diabetes Maternal Grandmother   . Diabetes Maternal Grandfather   . Cancer Paternal Grandfather     Prostate   Review of Systems: ROS in HPI. Otherwise: Review of Systems  Constitutional: Negative for chills, fever and weight loss.  Respiratory: Negative for cough and shortness of breath.   Cardiovascular: Negative for chest pain  and leg swelling.  Gastrointestinal: Negative for abdominal pain, constipation, diarrhea, nausea and vomiting.  Genitourinary: Negative for dysuria, frequency and urgency.  Musculoskeletal:       Foot pain, post-surgical   Physical Exam: Vitals:   12/07/16 1421  BP: (!) 189/87  Pulse: 89  Temp: 98 F (36.7 C)  TempSrc: Oral  SpO2: 100%  Weight: 227 lb 9.6 oz (103.2 kg)   Physical Exam  Constitutional: She appears well-developed. She is cooperative. No distress.  Cardiovascular: Normal rate, regular rhythm, normal heart sounds and normal pulses.  Exam reveals no gallop.   No murmur heard. Pulmonary/Chest: Effort normal and breath sounds normal. No respiratory distress. She has no wheezes. She has no rhonchi. She has no rales. Breasts are symmetrical.  Abdominal: Soft. Bowel sounds are normal. There is no tenderness.  Musculoskeletal: She exhibits no edema.    Assessment & Plan:  See encounters tab for problem based medical decision making. Patient discussed with Dr. 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 She has not yet started Byetta due to inability to afford the pen needle $3 copay.  She is currently taking 70/30 insulin 18 units twice a day.  She has met with a diabetic educator today. She denies any symptoms of polydipsia or polyuria.  On discussion with pharmacy, the patient currently has an [place] card and is qualified for free GLP-1 agonist from the health department medication assistance pharmacy. We also have Victoza samples in the clinic today. We will discontinue her Byetta since she has not started this yet and will switch to Victoza. She will begin this today with a sample provided and we will send a prescription to the health department pharmacy.  Plan: Start liraglutide 0.'6mg'$  once a  day. Sample of pen and needles provided today. Continue she will and 70/30, 18 units twice a day  Essential hypertension Patient was noted to have all abated blood pressure on her examination several weeks ago. At that time her metoprolol was transitioned to carvedilol 6.25 mg which was then increased to 12.5 mg twice a day at her last visit on 11/21/2016. She is also recently been started on lisinopril 20 mg. Dr. Charlynn Grimes recommended that we increase her lisinopril 20 mg to 40 mg at her visit 2 weeks ago, however, BMP which was done demonstrated decline in her renal function. Patient was instructed to maintain her 20 mg dose lisinopril at this time until further evaluation could be completed.  Patient reports that she has run out of her carvedilol since yesterday and has not taken her medication today. She is unable to afford this medication until she gets her paycheck in 2 weeks. We will provide a sample for her today with the assistance of pharmacy. We will also send a new prescriptions medication the [facility] where she will receive this medication free of discharge.  We will repeat BMP today to follow kidney function as her decline may be related to multiple factors. She has a history of significant proteinuria on prior urinalysis, and I feel that her nephropathy is likely due to uncontrolled hypertension and diabetes. On 10/12/16, patient's lisinopril was increased from 10 mg to 20 mg. At that time her BMP demonstrated serum creatinine of 1.67. BMP checked on 11/21/2016 demonstrates serum creatinine of 1.79. This increased represents a less than 20% increase from her baseline. I would recommend maximizing her ACE inhibitor to 40 mg today. We will plan further workup for her CK D stage III as noted below.   Plan: Continue carvedilol 12.5 mg twice a day Continue lisinopril 20 mg daily Check BMP for renal function today Return to clinic in 4 weeks for blood pressure check.  Diabetic  nephropathy ([place]) Patient had a trend of worsening serum creatinine over the last year. He has also had increasing dose of her lisinopril in order to control her hypertension. She has significant microalbuminuria and her nephropathy is likely due to long-standing diabetes with or without some component of uncontrolled hypertension.  We will recheck renal function today and consider referral to nephrology for further evaluation and management of declining renal function. This is likely medical renal disease however we should get a renal ultrasound to confirm there is no hydronephrosis. We will also get intact PTH and phosphorus for further evaluation of renal function. Patient is now advanced to CKD3. We will maximize her renal protection by increasing her lisinopril to 40 mg daily.  Plan: Check BMP today. Intact PTH with calcium, phosphorus. Renal ultrasound.   Signed: Holley Raring, MD 12/07/2016, 3:08 PM  Pager: [phone]

## 2016-12-07 NOTE — Assessment & Plan Note (Addendum)
Patient was started on Byetta which was approved by insurance at her last visit on 11/21/2016. She has not yet started Byetta due to inability to afford the pen needle $3 copay. She is currently taking 70/30 insulin 18 units twice a day.  She has met with a diabetic educator today. She denies any symptoms of polydipsia or polyuria.  On discussion with pharmacy, the patient currently has an [place] card and is qualified for free GLP-1 agonist from the health department medication assistance pharmacy. We also have Victoza samples in the clinic today. We will discontinue her Byetta since she has not started this yet and will switch to Victoza. She will begin this today with a sample provided and we will send a prescription to the health department pharmacy.  Plan: Start liraglutide 0.'6mg'$  once a day. Sample of pen and needles provided today. Continue she will and 70/30, 18 units twice a day

## 2016-12-07 NOTE — Progress Notes (Signed)
Lisinopril prescription transferred to [facility]

## 2016-12-07 NOTE — Patient Instructions (Addendum)
Dear Gaynell Face,   It was a pleasure seeing you today.   A meal plan for you might look like this:  Breakfast- 40-50 grams fo carbohydrate  Lunch 40-50 grams of carbohydrate  Dinner 40-50 grams carbohydrate  Snack before bedtime 15-30 grams carbohydrate  Possible utube videos on carb counting for reveiw:   [employer]yerNetworking.com.cy  http://www.zhang.org/- this one is kinda funny  Please make a follow up in 1 month or whatever fits your schedule  [name] [phone]

## 2016-12-07 NOTE — Progress Notes (Signed)
Diabetes Self-Management Education  Visit Type:  (P) Follow-up   Last visit was 11/16/2016 for DSMES  Assessment:  Primary concerns today: meal ideas to help with glycemic control.  Wants easy meals that hr daughter acn help her with and that will help her blood sugars. She is fearful of nocturnal hypoglycemia and eats extra to be sure she doesn't have problems. She is thinking about getting a job and recently had surgery for her Charcot foot.  She is on disability, so will only work part time. She takes her insulin regularly on a fairly good schedule and before meals. She was not familiar with it's action, peak or duration. She was not familiar with reading labels for carbohydrates or estimating portion sizes. Was not abel to get Byetta due to affordability  and has been out of coreg.  Preferred Learning Style: No preference indicated  Learning Readiness: Ready and Change in progress  ANTHROPOMETRICS: weight- 227.6#, BMI~ 38 obese class II- has cast on left leg/foot WEIGHT HISTORY: Highest: 230#Lowest-191#  SLEEP:need to assess at future visit She denies Food Intolerances,Nausea, Vomiting, Diarrhea, Constipation, hair loss Dining Out (times/week): ~ 1x weekly MEDICATIONS: 18 units of Novolin 70/30 twice a day BLOOD SUGAR:meter downloaded DIETARY INTAKE: Usual eating pattern includes 3 meals and 2 snacks per day.   Usual physical activity: limited by cast on foot and told to elevate her foot as much as possible  Estimated energy needs: 1600-1800 calories 170-180 g carbohydrates ( 45-50 per meal with 1 snack of 20-30)  ~ 80 g protein         Diabetes Self-Management Education - 12/07/16 1600      Health Coping   How would you rate your overall health? (P)  Good     Psychosocial Assessment   Patient Belief/Attitude about Diabetes (P)  Motivated to manage diabetes     Pre-Education Assessment   Patient understands incorporating nutritional management into lifestyle. (P)  Needs  Review   Patient understands using medications safely. (P)  Needs Review     Complications   How often do you check your blood sugar? (P)  3-4 times/day   Number of hypoglycemic episodes per month (P)  1   Can you tell when your blood sugar is high? (P)  Yes   Have you had a dilated eye exam in the past 12 months? (P)  Yes   Are you checking your feet? (P)  Yes   How many days per week are you checking your feet? (P)  7     Subsequent Visit   Since your last visit have you continued or begun to take your medications as prescribed? (P)  No   Since your last visit have you had your blood pressure checked? (P)  No   Since your last visit have you experienced any weight changes? (P)  No change   Since your last visit, are you checking your blood glucose at least once a day? (P)  Yes      Learning Objective:  Patient will have a greater understanding of diabetes self-management. Patient education plan is to attend individual and/or group sessions per assessed needs and concerns.   Plan:   Patient Instructions  Dear [place],   It was a pleasure seeing you today.   A meal plan for you might look like this:  Breakfast- 40-50 grams fo carbohydrate  Lunch 40-50 grams of carbohydrate  Dinner 40-50 grams carbohydrate  Snack before bedtime 15-30 grams carbohydrate  Possible utube  videos on carb counting for reveiw:   [employer]com.cy  http://www.zhang.org/- this one is kinda funny  Please make a follow up in 1 month or whatever fits your schedule  [name] [phone]     Expected Outcomes:     Education material provided: Food label handouts  If problems or questions, patient to contact team via:  Phone  Future DSME appointment: -  :  Appt start time: 1325 end time:  1405.     Intervention:  Nutrition education about options for improved blood sugar control, label reading, carb consistency and .she watched a video about  carb counting on utube. Coordination of care: discussed care with doctor- gave her pen needles  Teaching Method Utilized: Visual, Auditory, Hands on  Handouts given during visit include:diabetes meal planning, action of insulin formulations Barriers to learning/adherence to lifestyle change: lack of material resources Demonstrated degree of understanding via:  Teach Back   Monitoring/Evaluation:  Dietary intake, exercise, meter, and body weight in 4 week(s).  [place], RD 12/07/2016 5:00 PM.

## 2016-12-07 NOTE — Assessment & Plan Note (Addendum)
Patient had a trend of worsening serum creatinine over the last year. He has also had increasing dose of her lisinopril in order to control her hypertension. She has significant microalbuminuria and her nephropathy is likely due to long-standing diabetes with or without some component of uncontrolled hypertension.  We will recheck renal function today and consider referral to nephrology for further evaluation and management of declining renal function. This is likely medical renal disease however we should get a renal ultrasound to confirm there is no hydronephrosis. We will also get intact PTH and phosphorus for further evaluation of renal function. Patient is now advanced to CKD3. We will maximize her renal protection by increasing her lisinopril to 40 mg daily.  Plan: Check BMP today. Intact PTH with calcium, phosphorus. Renal ultrasound.

## 2016-12-07 NOTE — Patient Instructions (Signed)
Your blood pressure is still uncontrolled. This is likely due to having stopped the carvedilol medication as well as your acute pain. We will restart the carvedilol and provide a sample for you today. We will send this medication to the [facility] and that will be mailed directly to you free of charge. Please continue to take this medication twice a day. We will also increase her lisinopril from 20 mg to 40 mg every day.  For your diabetes please continue to take your insulin. We will also add Victoza which we've provided a sample for you today. You can get this medication free of charge from the health department pharmacy. You should take this medication twice a day with meals.  For your kidney function, we will get some blood work tests today as well as send you for an ultrasound of her kidneys. This will help [place] rule out other causes of renal disease.

## 2016-12-08 LAB — PTH, INTACT AND CALCIUM
CALCIUM: 8.5 mg/dL — AB (ref 8.7–10.2)
PTH: 50 pg/mL (ref 15–65)

## 2016-12-08 LAB — BMP8+ANION GAP
ANION GAP: 14 mmol/L (ref 10.0–18.0)
BUN/Creatinine Ratio: 10 (ref 9–23)
BUN: 19 mg/dL (ref 6–24)
CALCIUM: 8.6 mg/dL — AB (ref 8.7–10.2)
CO2: 24 mmol/L (ref 18–29)
Chloride: 104 mmol/L (ref 96–106)
Creatinine, Ser: 1.88 mg/dL — ABNORMAL HIGH (ref 0.57–1.00)
GFR calc Af Amer: 38 mL/min/{1.73_m2} — ABNORMAL LOW (ref >59)
GFR calc non Af Amer: 33 mL/min/{1.73_m2} — ABNORMAL LOW (ref >59)
GLUCOSE: 118 mg/dL — AB (ref 65–99)
POTASSIUM: 4.1 mmol/L (ref 3.5–5.2)
SODIUM: 142 mmol/L (ref 134–144)

## 2016-12-08 LAB — PHOSPHORUS: PHOSPHORUS: 4.1 mg/dL (ref 2.5–4.5)

## 2016-12-10 NOTE — Progress Notes (Signed)
Internal Medicine Clinic Attending  Case discussed with Dr. Strelow at the time of the visit.  We reviewed the resident's history and exam and pertinent patient test results.  I agree with the assessment, diagnosis, and plan of care documented in the resident's note.  

## 2016-12-14 ENCOUNTER — Telehealth: Payer: Self-pay

## 2016-12-14 DIAGNOSIS — E11610 Type 2 diabetes mellitus with diabetic neuropathic arthropathy: Secondary | ICD-10-CM

## 2016-12-14 DIAGNOSIS — Z659 Problem related to unspecified psychosocial circumstances: Secondary | ICD-10-CM

## 2016-12-14 DIAGNOSIS — Z609 Problem related to social environment, unspecified: Secondary | ICD-10-CM

## 2016-12-14 NOTE — Telephone Encounter (Signed)
Call from patient who reports she is having trouble getting meals and completing activities of daily living because she does not have anyone to help her. Advised patient that I will route her concerns to her provider for PCS . Patient reports she does not have any preference PCS agency does not care which one come out for services. Will call patient back after provider responds.

## 2016-12-19 MED FILL — LISINOPRIL 20 MG TABLET: 20 | 30 days supply | Qty: 30 | Fill #2

## 2016-12-19 MED FILL — CARVEDILOL 12.5 MG TABLET: 12.5 | 30 days supply | Qty: 60 | Fill #0

## 2016-12-19 MED FILL — MEGESTROL 40 MG TABLET: 40 | 30 days supply | Qty: 30 | Fill #3

## 2016-12-19 MED FILL — CLOPIDOGREL 75 MG TABLET: 75 | 30 days supply | Qty: 30 | Fill #7

## 2016-12-22 ENCOUNTER — Other Ambulatory Visit: Payer: Self-pay | Admitting: Internal Medicine

## 2016-12-23 ENCOUNTER — Telehealth: Payer: Self-pay

## 2016-12-23 ENCOUNTER — Telehealth: Payer: Self-pay | Admitting: Internal Medicine

## 2016-12-23 NOTE — Telephone Encounter (Signed)
Lm for rtc 

## 2016-12-23 NOTE — Telephone Encounter (Signed)
Patient would like a call back about her liraglutide (VICTOZA) 18 MG/3ML SOPN medication.

## 2016-12-23 NOTE — Telephone Encounter (Signed)
Called [place] med assist back, went thru phone tree, rang til it cut my call off, will try monday

## 2016-12-23 NOTE — Telephone Encounter (Signed)
Jane [name] from [facility] requesting to speak with a nurse regarding Lisinopril. Please call back.

## 2016-12-26 NOTE — Telephone Encounter (Signed)
Provided patient with a sample. She would need to switch to exenatide (Byetta) for Medicaid coverage. Patient was notified of this and she stated she will discuss with physician at upcoming office visit.  Medication Samples have been provided to the patient.  Drug: liraglutide (Victoza) Strength: 1.2 mg Qty: 1 LOT: [id number] Exp.Date: Jan 2019 Dosing instructions: inject 1.2 mg subcutaneously daily  The patient has been instructed regarding the correct time, dose, and frequency of taking this medication, including desired effects and most common side effects.

## 2016-12-26 NOTE — Telephone Encounter (Signed)
Dr [name] pt has medicaid states the md she talked to told her she could get her victoza for free at the health dept, she cant I ask why she just doesn't get it at the pharmacy and she states that medicaid wont pay for it she doesn't think, can we check on this

## 2016-12-26 NOTE — Telephone Encounter (Signed)
Dr [name] can you check [phone]

## 2016-12-29 ENCOUNTER — Other Ambulatory Visit: Payer: Self-pay

## 2016-12-29 NOTE — Telephone Encounter (Signed)
Pat from [facility] needs to speak with a nurse regarding lisinopril (PRINIVIL,ZESTRIL) 40 MG tablet.

## 2016-12-29 NOTE — Telephone Encounter (Signed)
Called [facility] - stated the matter has been resolved.

## 2017-01-02 ENCOUNTER — Ambulatory Visit (HOSPITAL_COMMUNITY): Payer: Medicaid Other

## 2017-01-02 MED FILL — ACCU-CHEK GUIDE TEST STRIP: 30 days supply | Qty: 100 | Fill #1

## 2017-01-04 ENCOUNTER — Ambulatory Visit: Payer: Medicaid Other | Admitting: Dietician

## 2017-01-04 ENCOUNTER — Ambulatory Visit (INDEPENDENT_AMBULATORY_CARE_PROVIDER_SITE_OTHER): Payer: Medicaid Other | Admitting: Pulmonary Disease

## 2017-01-04 VITALS — BP 148/76 | HR 108 | Temp 98.3°F | Ht 65.0 in | Wt 222.4 lb

## 2017-01-04 DIAGNOSIS — E113493 Type 2 diabetes mellitus with severe nonproliferative diabetic retinopathy without macular edema, bilateral: Secondary | ICD-10-CM | POA: Diagnosis not present

## 2017-01-04 DIAGNOSIS — E1122 Type 2 diabetes mellitus with diabetic chronic kidney disease: Secondary | ICD-10-CM

## 2017-01-04 DIAGNOSIS — N183 Chronic kidney disease, stage 3 unspecified: Secondary | ICD-10-CM

## 2017-01-04 DIAGNOSIS — R51 Headache: Secondary | ICD-10-CM | POA: Diagnosis not present

## 2017-01-04 DIAGNOSIS — E11610 Type 2 diabetes mellitus with diabetic neuropathic arthropathy: Secondary | ICD-10-CM | POA: Diagnosis not present

## 2017-01-04 DIAGNOSIS — I10 Essential (primary) hypertension: Secondary | ICD-10-CM

## 2017-01-04 DIAGNOSIS — I129 Hypertensive chronic kidney disease with stage 1 through stage 4 chronic kidney disease, or unspecified chronic kidney disease: Secondary | ICD-10-CM

## 2017-01-04 DIAGNOSIS — Z87891 Personal history of nicotine dependence: Secondary | ICD-10-CM | POA: Diagnosis not present

## 2017-01-04 DIAGNOSIS — Z794 Long term (current) use of insulin: Secondary | ICD-10-CM | POA: Diagnosis not present

## 2017-01-04 DIAGNOSIS — Z79899 Other long term (current) drug therapy: Secondary | ICD-10-CM

## 2017-01-04 MED ORDER — CARVEDILOL 25 MG PO TABS: 25.0000 mg | ORAL_TABLET | Freq: Two times a day (BID) | ORAL | 1 refills | Status: DC

## 2017-01-04 MED ORDER — ACCU-CHEK FASTCLIX LANCETS MISC: 12 refills | Status: DC

## 2017-01-04 NOTE — Progress Notes (Signed)
   CC: Hypertension  HPI:  Jane [name] is a 41 y.o. woman with history as noted below here for follow-up of her hypertension  She was prescribed oxycodone by Dr. Doran Durand. She is in pain from her foot. She has a mild headache.    Past Medical History:  Diagnosis Date  . Asthma    prn inhaler  . CAD S/P BMS PCI to prox LAD 03/31/2016   Ost LAD to Mid LAD lesion, 90% stenosed. Post intervention - Vision BMS 3.0 mm x 18 mm (~3.5 mm) there is a 0% residual stenosis.   . Charcot foot due to diabetes mellitus ([name]) 11/2016   left  . Depression   . Diabetic neuropathy (HCC)    feet  . H/O non-ST elevation myocardial infarction (NSTEMI) 03/2016   Found in 9% mid LAD lesion treated with bare-metal stent (BMS) PCI - vision BMS 3.0 mm x 18 mm  . History of MRSA infection   . Hyperlipidemia   . Hypertension    medication dose increased 11/22/2015; has been taking med. consistently since 03/2016, per pt.  . Insulin dependent diabetes mellitus ([place])   . Iron deficiency anemia    takes iron supplement  . Retinopathy of both eyes   . Sickle cell trait ([place])   . 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 No wheezes, rhonchi or rales. Breathing comfortably Heart: Regular rate and rhythm, no murmur/rub/gallop Abdomen: Soft, nontender, nondistended, no rebound/guarding Extremities: LLE in dressing from surgery  Skin: No rashes or lesions Neurologic: Alert and interactive. No gross deficits.  Assessment & Plan:   See Encounters Tab for problem based  charting.  Patient discussed with Dr. Lynnae January

## 2017-01-04 NOTE — Patient Instructions (Addendum)
Take two tablets (25mg ) of carvedilol twice a day until you run out of your current prescription. Then you may go back to one tablet (25mg ) twice a day.  Follow up in 4-6 weeks

## 2017-01-05 DIAGNOSIS — N186 End stage renal disease: Secondary | ICD-10-CM | POA: Insufficient documentation

## 2017-01-05 DIAGNOSIS — Z992 Dependence on renal dialysis: Secondary | ICD-10-CM | POA: Insufficient documentation

## 2017-01-05 LAB — BMP8+ANION GAP
Anion Gap: 14 (ref 10.0–18.0)
BUN/Creatinine Ratio: 13 (ref 9–23)
BUN: 22 mg/dL (ref 6–24)
CO2: 23 mmol/L (ref 18–29)
CREATININE: 1.72 mg/dL — AB (ref 0.57–1.00)
Calcium: 8.8 mg/dL (ref 8.7–10.2)
Chloride: 107 mmol/L — ABNORMAL HIGH (ref 96–106)
GFR calc Af Amer: 42 — ABNORMAL LOW (ref >59)
GFR, EST NON AFRICAN AMERICAN: 36 — AB (ref >59)
Glucose: 86 mg/dL (ref 65–99)
Potassium: 3.8 mmol/L (ref 3.5–5.2)
Sodium: 144 mmol/L (ref 134–144)

## 2017-01-05 NOTE — Assessment & Plan Note (Signed)
Assessment: Blood glucose is improved on Victoza  Plan: Continue Victoza 0.6 mg daily Continue Humulin 70/3018 units twice a day. May be able to down titrate insulin in future

## 2017-01-05 NOTE — Assessment & Plan Note (Signed)
Assessment: Creatinine improved from 1.8 to 1.7  Plan: Continue to monitor

## 2017-01-05 NOTE — Assessment & Plan Note (Signed)
Assessment: Underwent surgery for her Charcot foot in January. Would like PCS services for help around the house given her foot is still in surgical dressings  Plan: Referral to social work

## 2017-01-05 NOTE — Assessment & Plan Note (Addendum)
Assessment: BP improved to 148/76  Plan: Increase carvedilol to 25 mg twice a day Continue lisinopril 40 mg daily Follow-up in 4-6 weeks

## 2017-01-06 ENCOUNTER — Telehealth: Payer: Self-pay

## 2017-01-06 ENCOUNTER — Ambulatory Visit (HOSPITAL_COMMUNITY)
Admission: RE | Admit: 2017-01-06 | Discharge: 2017-01-06 | Disposition: A | Discharge status: Home / Self Care | Payer: Medicaid Other | Source: Ambulatory Visit | Attending: Internal Medicine | Admitting: Internal Medicine

## 2017-01-06 ENCOUNTER — Telehealth: Payer: Self-pay | Admitting: *Deleted

## 2017-01-06 ENCOUNTER — Ambulatory Visit: Payer: Medicaid Other | Admitting: Pharmacist

## 2017-01-06 DIAGNOSIS — N183 Chronic kidney disease, stage 3 (moderate): Secondary | ICD-10-CM | POA: Diagnosis not present

## 2017-01-06 DIAGNOSIS — E0821 Diabetes mellitus due to underlying condition with diabetic nephropathy: Secondary | ICD-10-CM

## 2017-01-06 DIAGNOSIS — Z79899 Other long term (current) drug therapy: Secondary | ICD-10-CM

## 2017-01-06 MED FILL — HYDROCODON-APAP 5-325: 5-325 | 4 days supply | Qty: 30 | Fill #0

## 2017-01-06 NOTE — Telephone Encounter (Signed)
Information sent to [place] for Prior Authorization for Victoza.  Approved 01/06/2017 thru 07/05/2017.  Sander Nephew, RN 01/06/2017 2:46 PM.

## 2017-01-06 NOTE — Progress Notes (Signed)
Internal Medicine Clinic Attending  Case discussed with Dr. Krall soon after the resident saw the patient.  We reviewed the resident's history and exam and pertinent patient test results.  I agree with the assessment, diagnosis, and plan of care documented in the resident's note. 

## 2017-01-06 NOTE — Progress Notes (Signed)
S: Jane [name] is a 41 y.o. female reports to clinical pharmacist appointment for medication review.  Allergies  Allergen Reactions  . Adhesive [Tape] Other (See Comments)    Irritation    Medication Sig  ACCU-CHEK FASTCLIX LANCETS MISC Check blood sugar 3x a day  as instructed  acetaminophen (TYLENOL) 325 MG tablet Take 650 mg by mouth every 6 (six) hours as needed.  ALL DAY ALLERGY 10 MG tablet TAKE 1 TABLET (10 MG TOTAL) BY MOUTH DAILY.  aspirin EC 81 MG tablet Take 1 tablet (81 mg total) by mouth 2 (two) times daily.  atorvastatin (LIPITOR) 40 MG tablet TAKE 1 Tablet BY MOUTH EVERY EVENING AT 6 PM  Blood Glucose Monitoring Suppl (ACCU-CHEK GUIDE) w/Device KIT 1 each by Does not apply route 3 (three) times daily.  carvedilol (COREG) 25 MG tablet Take 1 tablet (25 mg total) by mouth 2 (two) times daily with a meal.  clopidogrel (PLAVIX) 75 MG tablet Take 1 tablet (75 mg total) by mouth daily. Internal Medicine Program  docusate sodium (COLACE) 100 MG capsule Take 1 capsule (100 mg total) by mouth 2 (two) times daily. While taking narcotic pain medicine.  ferrous sulfate 325 (65 FE) MG tablet TAKE 1 Tablet BY MOUTH EVERY MORNING WITH BREAKFAST  glucose blood (ACCU-CHEK GUIDE) test strip Check blood sugar 3x a day  as instructed  HUMULIN 70/30 (70-30) 100 UNIT/ML injection INJECT 18 UNITS UNDER THE SKIN TWICE DAILY WITH MEALS (EACH VIAL IS GOOD FOR 28 DAYS ONCE OPENED)  Insulin Pen Needle (B-D UF III MINI PEN NEEDLES) 31G X 5 MM MISC Use for injection of Byetta as instructed.  Insulin Syringe-Needle U-100 31G X 15/64" 0.3 ML MISC Use to inject insulin two times a day  liraglutide (VICTOZA) 18 MG/3ML SOPN Inject 0.1 mLs (0.6 mg total) into the skin daily. After 1 wk, increase to 1.2 mg (0.2 mL).  lisinopril (PRINIVIL,ZESTRIL) 40 MG tablet Take 1 tablet (40 mg total) by mouth daily.  megestrol (MEGACE) 40 MG tablet Take 1 tablet (40 mg total) by mouth daily. Internal Medicine Program   oxyCODONE (ROXICODONE) 5 MG immediate release tablet Take 1-2 tablets (5-10 mg total) by mouth every 4 (four) hours as needed for moderate pain or severe pain. For no more than 3 days.  PROVENTIL HFA 108 (90 Base) MCG/ACT inhaler INHALE 2 PUFFS BY MOUTH EVERY 4 HOURS AS NEEDED FOR COUGHING, WHEEZING, OR SHORTNESS OF BREATH  senna (SENOKOT) 8.6 MG TABS tablet Take 2 tablets (17.2 mg total) by mouth 2 (two) times daily.   Past Medical History:  Diagnosis Date  . Asthma    prn inhaler  . CAD S/P BMS PCI to prox LAD 03/31/2016   Ost LAD to Mid LAD lesion, 90% stenosed. Post intervention - Vision BMS 3.0 mm x 18 mm (~3.5 mm) there is a 0% residual stenosis.   . Charcot foot due to diabetes mellitus ([place]) 11/2016   left  . Depression   . Diabetic neuropathy (HCC)    feet  . H/O non-ST elevation myocardial infarction (NSTEMI) 03/2016   Found in 9% mid LAD lesion treated with bare-metal stent (BMS) PCI - vision BMS 3.0 mm x 18 mm  . History of MRSA infection   . Hyperlipidemia   . Hypertension    medication dose increased 11/22/2015; has been taking med. consistently since 03/2016, per pt.  . Insulin dependent diabetes mellitus ([place])   . Iron deficiency anemia    takes iron supplement  .  Retinopathy of both eyes   . Sickle cell trait (HCC)   . 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 Marital status: Single    Spouse name: N/A  . Number of children: 2  . Years of education: 13   Occupational History  .   [employer]   Social History Main Topics  . Smoking status: Former Smoker    Packs/day: 0.00    Years: 0.00    Quit date: 08/29/2015  . Smokeless tobacco: Never Used  . Alcohol use No  . Drug use: No  . Sexual activity: Yes    Birth control/ protection: Surgical   Other Topics Concern  . Not on file   Social History Narrative   Patient does not drink caffeine.   Patient is right handed.    Family History  Problem Relation  Age of Onset  . Stroke Mother   . Diabetes Mother   . Hypertension Mother   . Aneurysm Mother   . Diabetes Father   . Hypertension Father   . Diabetes Sister   . Hypertension Sister   . Diabetes Maternal Grandmother   . Diabetes Maternal Grandfather   . Cancer Paternal Grandfather     Prostate   O: Component Value Date/Time   CHOL 243 (H) 06/09/2015 1211   HDL 65 06/09/2015 1211   TRIG 158 (H) 06/09/2015 1211   AST 22 06/18/2016 0730   ALT 15 06/18/2016 0730   NA 144 01/04/2017 1150   K 3.8 01/04/2017 1150   CL 107 (H) 01/04/2017 1150   CO2 23 01/04/2017 1150   GLUCOSE 86 01/04/2017 1150   GLUCOSE 112 (H) 06/18/2016 0730   HGBA1C 7.5 11/16/2016 1144   HGBA1C 13.9 (H) 08/15/2014 1546   BUN 22 01/04/2017 1150   CREATININE 1.72 (H) 01/04/2017 1150   CREATININE 1.07 06/09/2015 1211   CALCIUM 8.8 01/04/2017 1150   GFRAA 42 (L) 01/04/2017 1150   GFRAA 75 06/09/2015 1211   WBC 6.3 06/18/2016 0730   HGB 10.4 (L) 11/24/2016 0703   HCT 33.4 (L) 06/18/2016 0730   HCT 31.2 (L) 04/08/2016 1211   PLT 282 06/18/2016 0730   PLT 303 04/08/2016 1211   TSH 0.91 06/15/2016 1538   Ht Readings from Last 2 Encounters:  01/04/17 5\' 5"  (1.651 m)  11/24/16 5\' 5"  (1.651 m)   Wt Readings from Last 2 Encounters:  01/04/17 222 lb 6.4 oz (100.9 kg)  12/07/16 227 lb 9.6 oz (103.2 kg)   There is no height or weight on file to calculate BMI. BP Readings from Last 3 Encounters:  01/04/17 (!) 148/76  12/07/16 (!) 189/87  11/25/16 (!) 150/80   A/P:  Progressing towards disease state goals and has been successful accessing medications. Patient reports she now has Medicaid and will need help transitioning her medications in the future. She requests help accessing liraglutide due to pending prior auth and being out of the medication. Medication Samples have been provided to the patient. Drug: liraglutide Strength: 18 mg/2mL Qty: 1 LOT: 01/23/17 Exp.Date: Jan 2019 Dosing instructions: 1.2 mg  daily The patient has been instructed regarding the correct time, dose, and frequency of taking this medication, including desired effects and most common side effects.   Recommend evaluating lipids in the future for potential titration of atorvastatin as tolerated.  An after visit summary was provided and patient advised to follow up if any changes in condition or questions regarding medications  arise.   The patient verbalized understanding of information provided by repeating back concepts discussed.

## 2017-01-06 NOTE — Telephone Encounter (Signed)
[facility] stated they did not receive request for PCS because the fax number was not correct and form was outdated. New formed completed awaiting MD signature

## 2017-01-12 ENCOUNTER — Telehealth: Payer: Self-pay

## 2017-01-12 NOTE — Telephone Encounter (Signed)
Pt states she fell this morning, please call back.

## 2017-01-13 NOTE — Telephone Encounter (Signed)
Checked w/ pt, she states she does not need to be seen just wanted to report the fall, she has no injuries

## 2017-01-16 ENCOUNTER — Encounter: Payer: Self-pay | Admitting: *Deleted

## 2017-01-26 MED FILL — CLOPIDOGREL 75 MG TABLET: 75 | 30 days supply | Qty: 30 | Fill #8

## 2017-01-26 MED FILL — MEGESTROL 40 MG TABLET: 40 | 30 days supply | Qty: 30 | Fill #4

## 2017-02-02 ENCOUNTER — Telehealth: Payer: Self-pay

## 2017-02-02 NOTE — Telephone Encounter (Signed)
Requesting PA on liraglutide (VICTOZA) 18 MG/3ML SOPN.

## 2017-02-03 MED FILL — VICTOZA 2-PAK 18 MG/3 ML PE: 18 | 28 days supply | Qty: 6 | Fill #0

## 2017-02-21 ENCOUNTER — Ambulatory Visit: Payer: Self-pay

## 2017-02-23 ENCOUNTER — Encounter: Payer: Self-pay | Admitting: Cardiology

## 2017-02-23 ENCOUNTER — Telehealth: Payer: Self-pay | Admitting: Internal Medicine

## 2017-02-23 ENCOUNTER — Ambulatory Visit (INDEPENDENT_AMBULATORY_CARE_PROVIDER_SITE_OTHER): Payer: Medicaid Other | Admitting: Cardiology

## 2017-02-23 VITALS — BP 144/94 | HR 109 | Ht 65.0 in | Wt 230.8 lb

## 2017-02-23 DIAGNOSIS — E6609 Other obesity due to excess calories: Secondary | ICD-10-CM

## 2017-02-23 DIAGNOSIS — Z9861 Coronary angioplasty status: Secondary | ICD-10-CM

## 2017-02-23 DIAGNOSIS — I10 Essential (primary) hypertension: Secondary | ICD-10-CM | POA: Diagnosis not present

## 2017-02-23 DIAGNOSIS — I200 Unstable angina: Secondary | ICD-10-CM | POA: Diagnosis not present

## 2017-02-23 DIAGNOSIS — E1169 Type 2 diabetes mellitus with other specified complication: Secondary | ICD-10-CM

## 2017-02-23 DIAGNOSIS — I779 Disorder of arteries and arterioles, unspecified: Secondary | ICD-10-CM | POA: Diagnosis not present

## 2017-02-23 DIAGNOSIS — E785 Hyperlipidemia, unspecified: Secondary | ICD-10-CM | POA: Diagnosis not present

## 2017-02-23 DIAGNOSIS — I208 Other forms of angina pectoris: Secondary | ICD-10-CM | POA: Diagnosis not present

## 2017-02-23 DIAGNOSIS — R000 Tachycardia, unspecified: Secondary | ICD-10-CM

## 2017-02-23 DIAGNOSIS — E66811 Obesity, class 1: Secondary | ICD-10-CM

## 2017-02-23 DIAGNOSIS — I2510 Atherosclerotic heart disease of native coronary artery without angina pectoris: Secondary | ICD-10-CM | POA: Diagnosis not present

## 2017-02-23 DIAGNOSIS — Z79899 Other long term (current) drug therapy: Secondary | ICD-10-CM

## 2017-02-23 DIAGNOSIS — I2089 Other forms of angina pectoris: Secondary | ICD-10-CM

## 2017-02-23 DIAGNOSIS — Z955 Presence of coronary angioplasty implant and graft: Secondary | ICD-10-CM | POA: Diagnosis not present

## 2017-02-23 MED ORDER — DILTIAZEM HCL ER COATED BEADS 180 MG PO CP24: 180.0000 mg | ORAL_CAPSULE | Freq: Every day | ORAL | 3 refills | Status: DC

## 2017-02-23 MED FILL — CARTIA XT 180 MG CAPSULE SA: 180 | 30 days supply | Qty: 30 | Fill #0

## 2017-02-23 NOTE — Telephone Encounter (Signed)
Helen can you please close this encounter ° °

## 2017-02-23 NOTE — Patient Instructions (Signed)
Medication addition  DILTIAZEM  180 MG - ONCE DAILY.   LABS - LIPID, CMP- DO NOT EAT OR DRINK THE MORNING    Your physician recommends that you schedule a follow-up appointment in [place].  If you need a refill on your cardiac medications before your next appointment, please call your pharmacy.

## 2017-02-23 NOTE — Telephone Encounter (Signed)
Calling to confirm appointment for 02/24/17 at 10:15 lmtcb

## 2017-02-23 NOTE — Progress Notes (Signed)
PCP: Alphonzo Grieve, MD  Clinic Note: Chief Complaint  Patient presents with  . Follow-up    pt states feeling a dull pain in her chest area sometimes with a little SOB, dizziness with some nauseation     HPI: Jane [name] is a 41 y.o. female with a PMH below who presents today for Four-month follow-up of single vessel CAD status post BMS PCI to LAD back in May 2017 insetting of a non-STEMI - converted from Brilinta to Plavix after vaginal bleed.. She has very poorly controlled diabetes with significant neuropathy and resultant Charcot foot left foot. Probably as a result of her diabetes, she really does not recall any true anginal symptoms at the time of MI. She basically was dyspneic and had some GI symptoms. She has significant social issues and initially following her her hospitalization for MI was in a homeless shelter. She subsequently related to an apartment with Medicaid assistance.  She has diabetes mellitus with a Charcot foot on the left - s/p surgery in Jan 2018 - . Just got cast off.  Due to get fitted for a CROW Walker   Jane [name] was last seen on 10/17/2016 for preoperative evaluation.  Recent Hospitalizations: Same day operation for left tight heel cord and Charcot foot deformity: He performed a left Achilles tendon tethering, left hindfoot osteotomy as well as left hindfoot/midfoot arthrodesis  Studies Reviewed: No new studies  Interval History: She returns today feeling in on her scooter as her foot is still in a brace. She is finding out the stage where she may be old take it out of the cast and brace is still hurts quite a bit and she is concerned about lack of mobility.   She has lots of symptoms and pretty much was positive for most review of symptoms questions. She has 1 or 2 episodes of chest pain in the course of a week which is a dull aching sensation in her chest that happens while watching TV. She is not very active socially not able to tell if  there is any new happens worse with exertion. It does not happen all the time, does not have any routine durations, lasts. Nothing really makes it better, less she can adjust her position. She also notes that her heart rate will go up sometimes when she is doing hardly any activity or even sitting at rest. If she does treadmill activity or heart rate will go up and this is also exacerbated by pain. She doesn't say that these increased heart rates are irregular, and are occasionally associated with dizziness/wooziness, but no syncope or near syncope.. No intermittent palpitation type symptoms. No real resting dyspnea, but will get short of breath if she tries to do to much. She has some mild occasional swelling more than the opposite foot. No PND or orthopnea however.  A couple symptoms that she denies are near-syncope or syncope type symptoms along with TIA or amaurosis fugax symptoms.  ROS: A comprehensive was performed. Review of Systems  Constitutional: Positive for malaise/fatigue (just not doing much).  HENT: Positive for congestion.   Respiratory: Positive for shortness of breath. Negative for cough and wheezing.   Cardiovascular: Positive for chest pain (OFF & on @ rest or exertion), palpitations and leg swelling (OCCASIONAL).  Gastrointestinal: Positive for constipation and heartburn (INDIGESTION). Negative for blood in stool and melena.  Genitourinary: Negative for hematuria.       No further vaginal bleeding  Musculoskeletal: Positive for joint pain.  Negative for falls and myalgias.       L foot still hurts post-op  Neurological: Positive for dizziness (off & on).  Endo/Heme/Allergies: Does not bruise/bleed easily.  Psychiatric/Behavioral: The patient is nervous/anxious (Less so). The patient does not have insomnia.   All other systems reviewed and are negative.   Past Medical History:  Diagnosis Date  . Asthma    prn inhaler  . CAD S/P BMS PCI to prox LAD 03/31/2016   Ost LAD  to Mid LAD lesion, 90% stenosed. Post intervention - Vision BMS 3.0 mm x 18 mm (~3.5 mm) there is a 0% residual stenosis.   . Charcot foot due to diabetes mellitus ([place]) 11/2016   left  . Depression   . Diabetic neuropathy (HCC)    feet  . H/O non-ST elevation myocardial infarction (NSTEMI) 03/2016   Found in 9% mid LAD lesion treated with bare-metal stent (BMS) PCI - vision BMS 3.0 mm x 18 mm  . History of MRSA infection   . Hyperlipidemia   . Hypertension    medication dose increased 11/22/2015; has been taking med. consistently since 03/2016, per pt.  . Insulin dependent diabetes mellitus ([place])   . Iron deficiency anemia    takes iron supplement  . Retinopathy of both eyes   . Sickle cell trait ([place])   . 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 ACHILLES TENDON LENGTHENING Left 11/24/2016   Procedure: Left Achilles tendon lengthening (open);  Surgeon: Wylene Simmer, MD;  Location: [facility];  Service: Orthopedics;  Laterality: Left;  . CALCANEAL OSTEOTOMY Left 11/24/2016   Procedure: Left hindfoot osteotomy and fusion;  Surgeon: Wylene Simmer, MD;  Location: [place];  Service: Orthopedics;  Laterality: Left;  . CARDIAC CATHETERIZATION N/A 03/31/2016   Procedure: Left Heart Cath and Coronary Angiography;  Surgeon: Lorretta Harp, MD;  Location: [facility] INVASIVE CV LAB: 90% early mLAD, normal LV Fxn  . CARDIAC CATHETERIZATION N/A 03/31/2016   Procedure: Coronary Stent Intervention;  Surgeon: Lorretta Harp, MD;  Location: [place] CV LAB;  Service: Cardiovascular: PCI to mLAD BMS Vision 3.0 mm x 18 mm  . CESAREAN SECTION  1997  . TUBAL LIGATION  1997    Cardiac catheterization with PCI May 2017: Diagnostic Diagram - 90% mLAD; nl LV Fxn      Post-Intervention Diagram - Vision BMS 3.66m x 18 mm       2D Echo 03/2016: Normal LV size and thickness. EF 60-65%. GR 1 DD. Otherwise essentially  normal.   Current Meds  Medication Sig  . ACCU-CHEK FASTCLIX LANCETS MISC Check blood sugar 3x a day  as instructed  . acetaminophen (TYLENOL) 325 MG tablet Take 650 mg by mouth every 6 (six) hours as needed.  .Marland KitchenALL DAY ALLERGY 10 MG tablet TAKE 1 TABLET (10 MG TOTAL) BY MOUTH DAILY.  .Marland Kitchenaspirin EC 81 MG tablet Take 1 tablet (81 mg total) by mouth 2 (two) times daily.  .Marland Kitchenatorvastatin (LIPITOR) 40 MG tablet TAKE 1 Tablet BY MOUTH EVERY EVENING AT 6 PM  . Blood Glucose Monitoring Suppl (ACCU-CHEK GUIDE) w/Device KIT 1 each by Does not apply route 3 (three) times daily.  . carvedilol (COREG) 25 MG tablet Take 1 tablet (25 mg total) by mouth 2 (two) times daily with a meal.  . clopidogrel (PLAVIX) 75 MG tablet Take 1 tablet (75 mg total) by mouth daily. Internal Medicine Program  .  docusate sodium (COLACE) 100 MG capsule Take 1 capsule (100 mg total) by mouth 2 (two) times daily. While taking narcotic pain medicine.  . ferrous sulfate 325 (65 FE) MG tablet TAKE 1 Tablet BY MOUTH EVERY MORNING WITH BREAKFAST  . Insulin Pen Needle (B-D UF III MINI PEN NEEDLES) 31G X 5 MM MISC Use for injection of Byetta as instructed.  . liraglutide (VICTOZA) 18 MG/3ML SOPN Inject 0.1 mLs (0.6 mg total) into the skin daily. After 1 wk, increase to 1.2 mg (0.2 mL).  Marland Kitchen lisinopril (PRINIVIL,ZESTRIL) 40 MG tablet Take 1 tablet (40 mg total) by mouth daily.  . megestrol (MEGACE) 40 MG tablet Take 1 tablet (40 mg total) by mouth daily. Internal Medicine Program  . oxyCODONE (ROXICODONE) 5 MG immediate release tablet Take 1-2 tablets (5-10 mg total) by mouth every 4 (four) hours as needed for moderate pain or severe pain. For no more than 3 days.  Marland Kitchen PROVENTIL HFA 108 (90 Base) MCG/ACT inhaler INHALE 2 PUFFS BY MOUTH EVERY 4 HOURS AS NEEDED FOR COUGHING, WHEEZING, OR SHORTNESS OF BREATH  . senna (SENOKOT) 8.6 MG TABS tablet Take 2 tablets (17.2 mg total) by mouth 2 (two) times daily.  . [DISCONTINUED] glucose blood  (ACCU-CHEK GUIDE) test strip Check blood sugar 3x a day  as instructed  . [DISCONTINUED] HUMULIN 70/30 (70-30) 100 UNIT/ML injection INJECT 18 UNITS UNDER THE SKIN TWICE DAILY WITH MEALS (EACH VIAL IS GOOD FOR 28 DAYS ONCE OPENED)  . [DISCONTINUED] Insulin Syringe-Needle U-100 31G X 15/64" 0.3 ML MISC Use to inject insulin two times a day    Allergies  Allergen Reactions  . Adhesive [Tape] Other (See Comments)    Irritation     Social History   Social History  . Marital status: Single    Spouse name: N/A  . Number of children: 2  . Years of education: 13   Occupational History  .   [employer]   Social History Main Topics  . Smoking status: Former Smoker    Packs/day: 0.00    Years: 0.00    Quit date: 08/29/2015  . Smokeless tobacco: Never Used  . Alcohol use No  . Drug use: No  . Sexual activity: Yes    Birth control/ protection: Surgical   Other Topics Concern  . None   Social History Narrative   Patient does not drink caffeine.   Patient is right handed.     family history includes Aneurysm in her mother; Cancer in her paternal grandfather; Diabetes in her father, maternal grandfather, maternal grandmother, mother, and sister; Hypertension in her father, mother, and sister; Stroke in her mother.  Wt Readings from Last 3 Encounters:  02/24/17 230 lb 6.4 oz (104.5 kg)  02/23/17 230 lb 12.8 oz (104.7 kg)  01/04/17 222 lb 6.4 oz (100.9 kg)    PHYSICAL EXAM BP (!) 144/94   Pulse (!) 109   Ht '5\' 5"'$  (1.651 m)   Wt 230 lb 12.8 oz (104.7 kg)   SpO2 99%   BMI 38.41 kg/m  General appearance: alert, cooperative, appears stated age, no distress and Moderately obese. She is relatively well-groomed. Somewhat anxious, pleasant mood and affect. HEENT: /AT, EOMI, MMM, anicteric sclera Neck: no adenopathy, no carotid bruit and no JVD Lungs: Mostly CTA B, however she does have some upper lung field expiratory wheezes. No rales or rhonchi. Nonlabored. Heart:  RRR with normal S1 and S2 no M/R/G. Really can't palpate PMI. Abdomen: soft, non-tender; bowel sounds  normal; no masses,  no organomegaly; no HJR Extremities: Left foot/ankle is in a soft brace/Ace wrap. Obviously unable to determine any edema or pulses. There is mild 1+ edema in the right foot and ankle. Normal pulses in the right. Neurologic: Mental status: Alert, oriented, thought content appropriate   Adult ECG Report  Rate: 109 ;  Rhythm: sinus tachycardia and Anterior MI, age undetermined. Otherwise normal axis, intervals and durations.;   Narrative Interpretation: Stable EKG with the exception of increased heart rate   Other studies Reviewed: Additional studies/ records that were reviewed today include:  Recent Labs:  Unfortunately recent labs were not drawn. Lab Results  Component Value Date   CHOL 243 (H) 06/09/2015   HDL 65 06/09/2015   LDLCALC 146 (H) 06/09/2015   TRIG 158 (H) 06/09/2015   CHOLHDL 3.7 06/09/2015     ASSESSMENT / PLAN: Problem List Items Addressed This Visit    Atypical angina ([place])    She had symptoms that were somewhat atypical but were interpreted as unstable angina back in the time of her headache catheterization. Unfortunately, she does not recall those symptoms.  I don't think that her current aching chest discomfort is anginal in nature.  I have started diltiazem for additional blood pressure, heart rate as well as anti-anginal effect.      Relevant Medications   diltiazem (CARDIZEM CD) 180 MG 24 hr capsule   Other Relevant Orders   EKG 12-Lead (Completed)   CAD S/P BMS PCI to prox LAD - Primary (Chronic)    Single-vessel disease with a relatively large stent in the LAD. Converted to Plavix from Brilinta partly because of vaginal bleeding. She is now on pre-much max dose carvedilol and lisinopril. She is on stable dose of atorvastatin. Very hard to figure out what her anginal symptoms are. I don't think these episodes of dull aching pain in her  chest while watching TV that is not exacerbated with exertion and syncope with any anginal symptoms. She really had no other disease. Most of this discomfort sound like his musculoskeletal.  Just to make sure that we are optimizing antianginal regimen, I will add diltiazem partly for rate control and blood pressure control as well as for antianginal benefit.  Continue to monitor      Relevant Medications   diltiazem (CARDIZEM CD) 180 MG 24 hr capsule   Other Relevant Orders   EKG 12-Lead (Completed)   Essential hypertension (Chronic)    Not adequately controlled on max dose lisinopril and carvedilol. Her heart rate is also extremely fast. At this point I think I would like to restart diltiazem which was planned before. As a calcium channel blocker, there is an antianginal effect along with rate control and blood pressure benefit. I do think some of tachycardias driven by pain, but she will still benefit from additional antianginal and and and benefits of treatment.      Relevant Medications   diltiazem (CARDIZEM CD) 180 MG 24 hr capsule   Other Relevant Orders   EKG 12-Lead (Completed)   Comprehensive metabolic panel   Hyperlipidemia associated with type 2 diabetes mellitus ([place]) (Chronic)    Unfortunately, I have not seen labs checked recently. She thankfully has been able to get access to atorvastatin, but now needs to have labs checked. I've ordered a lipid panel and chemistry panel.  We will need to determine if any more aggressive treatment is warranted. With her poorly controlled diabetes and hypertension as well as obesity, she is already  high risk for CAD. She now has documented CAD as well as PAD. Target HDL level is clearly less than 70.      Relevant Medications   diltiazem (CARDIZEM CD) 180 MG 24 hr capsule   Other Relevant Orders   Lipid panel   Obesity (Chronic)    She had initially lost weight and has gained back about 8 pounds. Probably because immobility from her  foot surgery. Hopefully once she starts recovering from foot surgery, she'll be able to get back into some type of exercise. Also is probably not that easy for her to be very selective with her dietary options. She probably would benefit from nutrition counseling to help with diabetic and CAD related dietary adjustment.      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  She has had ABI/TBI's done through vascular surgery the past. Would probably be due for recheck again this year which showed delay until she is recovered from her surgery.       Relevant Medications   diltiazem (CARDIZEM CD) 180 MG 24 hr capsule   Other Relevant Orders   EKG 12-Lead (Completed)   Presence of bare metal stent in LAD coronary artery (Chronic)   Relevant Orders   EKG 12-Lead (Completed)   Sinus tachycardia    Resting sinus tachycardia that is probably exacerbated by pain. She is on high-dose carvedilol and still is going fast. Plan: Add diltiazem at moderate dose. This can be titrated up for both blood pressure and heart rate benefit.      Relevant Orders   EKG 12-Lead (Completed)   Comprehensive metabolic panel   RESOLVED: Unstable angina (HCC)   Relevant Medications   diltiazem (CARDIZEM CD) 180 MG 24 hr capsule   Other Relevant Orders   EKG 12-Lead (Completed)    Other Visit Diagnoses    Medication management       Relevant Orders   Lipid panel      Current medicines are reviewed at length with the patient today. (+/- concerns) Fast heart rate spells, unusual chest discomfort etc. The following changes have been made:   Patient Instructions  Medication addition  DILTIAZEM  180 MG - ONCE DAILY.   LABS - LIPID, CMP- DO NOT EAT OR DRINK THE MORNING    Your physician recommends that you schedule a follow-up appointment in [place].  If you need a refill on your cardiac medications before  your next appointment, please call your pharmacy.     Studies Ordered:   Orders Placed This Encounter  Procedures  . Comprehensive metabolic panel  . Lipid panel  . EKG 12-Lead      Glenetta Hew, M.D., M.S. Interventional Cardiologist   Pager # [phone] Phone # [phone] [address] [place], [place] [address]

## 2017-02-24 ENCOUNTER — Encounter: Payer: Self-pay | Admitting: Cardiology

## 2017-02-24 ENCOUNTER — Encounter: Payer: Self-pay | Admitting: Internal Medicine

## 2017-02-24 ENCOUNTER — Ambulatory Visit (INDEPENDENT_AMBULATORY_CARE_PROVIDER_SITE_OTHER): Payer: Medicaid Other | Admitting: Internal Medicine

## 2017-02-24 VITALS — BP 140/91 | HR 108 | Temp 98.4°F | Wt 230.4 lb

## 2017-02-24 DIAGNOSIS — E11610 Type 2 diabetes mellitus with diabetic neuropathic arthropathy: Secondary | ICD-10-CM | POA: Diagnosis not present

## 2017-02-24 DIAGNOSIS — I10 Essential (primary) hypertension: Secondary | ICD-10-CM | POA: Diagnosis present

## 2017-02-24 DIAGNOSIS — Z6838 Body mass index (BMI) 38.0-38.9, adult: Secondary | ICD-10-CM | POA: Diagnosis not present

## 2017-02-24 DIAGNOSIS — E113493 Type 2 diabetes mellitus with severe nonproliferative diabetic retinopathy without macular edema, bilateral: Secondary | ICD-10-CM

## 2017-02-24 DIAGNOSIS — Z794 Long term (current) use of insulin: Secondary | ICD-10-CM

## 2017-02-24 DIAGNOSIS — Z79899 Other long term (current) drug therapy: Secondary | ICD-10-CM | POA: Diagnosis not present

## 2017-02-24 LAB — GLUCOSE, CAPILLARY: GLUCOSE-CAPILLARY: 148 mg/dL — AB (ref 65–99)

## 2017-02-24 LAB — POCT GLYCOSYLATED HEMOGLOBIN (HGB A1C): HEMOGLOBIN A1C: 8

## 2017-02-24 MED ORDER — "INSULIN SYRINGE-NEEDLE U-100 31G X 15/64"" 0.3 ML MISC": 12 refills | Status: DC

## 2017-02-24 MED ORDER — INSULIN NPH ISOPHANE & REGULAR (70-30) 100 UNIT/ML ~~LOC~~ SUSP: 16.0000 [IU] | Freq: Two times a day (BID) | SUBCUTANEOUS | 11 refills | Status: DC

## 2017-02-24 MED ORDER — GLUCOSE BLOOD VI STRP: ORAL_STRIP | 12 refills | Status: DC

## 2017-02-24 MED FILL — MEGESTROL 40 MG TABLET: 40 | 30 days supply | Qty: 30 | Fill #5

## 2017-02-24 MED FILL — CLOPIDOGREL 75 MG TABLET: 75 | 30 days supply | Qty: 30 | Fill #9

## 2017-02-24 NOTE — Assessment & Plan Note (Signed)
Her A1c increased slightly from 7.5 to 8.0. She has been unable to check her blood sugars as her glucometer battery ran out. Due to her frequent hypoglycemia she is likely over correcting and this is causing an increase in her blood sugars. Advised her to decrease 70/30 to 16 units twice daily with meals. Hopefully this will decrease the frequency of her hypoglycemia and prevent overcorrection of blood sugars. She may benefit from continuous glucose monitoring. Will have her continue Liraglutide 1.2 mg daily. This is also helpful for weight loss which will further benefit her diabetes. Recommended that she pick up a new battery for her glucometer and test 3-4 times daily. Her test strips and syringes were refilled. She will follow up in 3 months.

## 2017-02-24 NOTE — Assessment & Plan Note (Addendum)
Single-vessel disease with a relatively large stent in the LAD. Converted to Plavix from Brilinta partly because of vaginal bleeding. She is now on pre-much max dose carvedilol and lisinopril. She is on stable dose of atorvastatin. Very hard to figure out what her anginal symptoms are. I don't think these episodes of dull aching pain in her chest while watching TV that is not exacerbated with exertion and syncope with any anginal symptoms. She really had no other disease. Most of this discomfort sound like his musculoskeletal.  Just to make sure that we are optimizing antianginal regimen, I will add diltiazem partly for rate control and blood pressure control as well as for antianginal benefit.  Continue to monitor

## 2017-02-24 NOTE — Patient Instructions (Addendum)
General Instructions: - Decrease 70/30 to 16 units twice daily - Pick up a new battery for your glucometer - Syringes and test strips refilled. Please pick these up - Pick up some juicy juice boxes and glucose tablets to help with low blood sugars - Follow up in 3 months   Please bring your medicines with you each time you come to clinic.  Medicines may include prescription medications, over-the-counter medications, herbal remedies, eye drops, vitamins, or other pills.   Progress Toward Treatment Goals:  Treatment Goal 09/07/2015  Hemoglobin A1C unable to assess  Blood pressure unchanged    Self Care Goals & Plans:  Self Care Goal 05/23/2016  Manage my medications take my medicines as prescribed; bring my medications to every visit; refill my medications on time  Monitor my health keep track of my blood glucose; bring my glucose meter and log to each visit  Eat healthy foods drink diet soda or water instead of juice or soda; eat more vegetables; eat foods that are low in salt; eat baked foods instead of fried foods; eat fruit for snacks and desserts  Be physically active find an activity I enjoy  Meeting treatment goals maintain the current self-care plan    Home Blood Glucose Monitoring 09/07/2015  Check my blood sugar 2 times a day  When to check my blood sugar before breakfast; before dinner     Care Management & Community Referrals:  Referral 10/16/2012  Referrals made for care management support diabetes educator

## 2017-02-24 NOTE — Assessment & Plan Note (Signed)
Not adequately controlled on max dose lisinopril and carvedilol. Her heart rate is also extremely fast. At this point I think I would like to restart diltiazem which was planned before. As a calcium channel blocker, there is an antianginal effect along with rate control and blood pressure benefit. I do think some of tachycardias driven by pain, but she will still benefit from additional antianginal and and and benefits of treatment.

## 2017-02-24 NOTE — Assessment & Plan Note (Signed)
Clearly she has vascular disease some with her Charcot foot.  She has had ABI/TBI's done through vascular surgery the past. Would probably be due for recheck again this year which showed delay until she is recovered from her surgery.

## 2017-02-24 NOTE — Progress Notes (Signed)
CC: Hypertension follow-up  HPI:  [name] is a 41 y.o. woman with past medical history as noted below who presents today for follow-up of her hypertension.  HTN: BP is borderline high at 140/91. She is taking Coreg 25 mg twice daily and lisinopril 40 mg daily. She saw her cardiologist, Dr. [name], yesterday and was started on diltiazem 180 mg daily. She has not started this medication yet. She reports she was started on this medication due to her fast heart beat.  Type 2 diabetes: Last A1c was 7.5. Today her A1c is 8.0. Patient reports her glucometer stopped working about 1 month ago because the battery died. She has been unable to pick up a new battery due to her Charcot foot and being limited in mobility. She is taking 70/30 18 units twice daily with meals and Liraglutide 1.2 mg daily. She reports one her meter was working, the highest blood sugar she saw was 150. She reports most blood sugars were in the 80-1 20 range. She reports frequent hypoglycemia, about 2-3 times per week and mostly on the weekends. She believes this is due to her daughter not being around on the weekends and so she does not have help getting food. Her bedroom is on the upstairs floor and she is unable to frequently go up and down the stairs due to her Charcot foot so she will go all day without eating. She reports she just got a mini fridge for her room and will be able to keep food and snacks upstairs now.  Past Medical History:  Diagnosis Date  . Asthma    prn inhaler  . CAD S/P BMS PCI to prox LAD 03/31/2016   Ost LAD to Mid LAD lesion, 90% stenosed. Post intervention - Vision BMS 3.0 mm x 18 mm (~3.5 mm) there is a 0% residual stenosis.   . Charcot foot due to diabetes mellitus ([place]) 11/2016   left  . Depression   . Diabetic neuropathy (HCC)    feet  . H/O non-ST elevation myocardial infarction (NSTEMI) 03/2016   Found in 9% mid LAD lesion treated with bare-metal stent (BMS) PCI - vision BMS  3.0 mm x 18 mm  . History of MRSA infection   . Hyperlipidemia   . Hypertension    medication dose increased 11/22/2015; has been taking med. consistently since 03/2016, per pt.  . Insulin dependent diabetes mellitus ([place])   . Iron deficiency anemia    takes iron supplement  . Retinopathy of both eyes   . Sickle cell trait ([place])   . 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    Review of Systems:   General: Denies fever, chills, night sweats, changes in weight, changes in appetite HEENT: Denies headaches, ear pain, changes in vision, rhinorrhea, sore throat CV: Denies CP, palpitations, SOB, orthopnea Pulm: Denies SOB, cough, wheezing GI: Denies abdominal pain, nausea, vomiting, diarrhea, constipation, melena, hematochezia GU: Denies dysuria, hematuria, frequency Msk: Denies muscle cramps, joint pains Neuro: Denies weakness, numbness, tingling Skin: Denies rashes, bruising Psych: Denies depression, anxiety, hallucinations  Physical Exam:  Vitals:   02/24/17 1009  BP: (!) 140/91  Pulse: (!) 108  Temp: 98.4 F (36.9 C)  TempSrc: Oral  SpO2: 100%  Weight: 230 lb 6.4 oz (104.5 kg)   General: Obese young woman sitting up, NAD CV: Tachycardic, normal S1/S2, no m/g/r Pulm: CTA bilaterally, breaths non-labored Ext: No peripheral edema. Left foot in cast.  Assessment & Plan:   See Encounters Tab for  problem based charting.  Patient discussed with Dr. Angelia Mould

## 2017-02-24 NOTE — Assessment & Plan Note (Signed)
Blood pressure is borderline today. Will have her continue Coreg 25 mg twice daily and lisinopril 40 mg daily. She has not started diltiazem yet. Additionally, she is having pain due to her left Charcot foot which could be causing her blood pressure to be elevated. Will have her follow up in 3 months when she is out of her leg cast and more mobile and determine if she needs another antihypertensive at that point.

## 2017-02-25 NOTE — Assessment & Plan Note (Addendum)
She had symptoms that were somewhat atypical but were interpreted as unstable angina back in the time of her headache catheterization. Unfortunately, she does not recall those symptoms.  I don't think that her current aching chest discomfort is anginal in nature.  I have started diltiazem for additional blood pressure, heart rate as well as anti-anginal effect.

## 2017-02-25 NOTE — Assessment & Plan Note (Signed)
Unfortunately, I have not seen labs checked recently. She thankfully has been able to get access to atorvastatin, but now needs to have labs checked. I've ordered a lipid panel and chemistry panel.  We will need to determine if any more aggressive treatment is warranted. With her poorly controlled diabetes and hypertension as well as obesity, she is already high risk for CAD. She now has documented CAD as well as PAD. Target HDL level is clearly less than 70.

## 2017-02-25 NOTE — Assessment & Plan Note (Signed)
She had initially lost weight and has gained back about 8 pounds. Probably because immobility from her foot surgery. Hopefully once she starts recovering from foot surgery, she'll be able to get back into some type of exercise. Also is probably not that easy for her to be very selective with her dietary options. She probably would benefit from nutrition counseling to help with diabetic and CAD related dietary adjustment.

## 2017-02-25 NOTE — Assessment & Plan Note (Signed)
Resting sinus tachycardia that is probably exacerbated by pain. She is on high-dose carvedilol and still is going fast. Plan: Add diltiazem at moderate dose. This can be titrated up for both blood pressure and heart rate benefit.

## 2017-02-27 ENCOUNTER — Telehealth: Payer: Self-pay | Admitting: Cardiology

## 2017-02-27 NOTE — Telephone Encounter (Signed)
Pt said she misplaced her lab order.She needs another one please.

## 2017-02-27 NOTE — Telephone Encounter (Signed)
SPOKE TO PATIENT  INFORMED PATIENT - ADDRESS GIVEN AND PHONE NUMBER - PATIENT DOES NOT NEED PAPER WORK   NOTHING TO EAT OR DRINK PRIOR TO LABS  PATIENT VERBALIZED UNDERSTANDING.

## 2017-02-28 NOTE — Progress Notes (Signed)
Internal Medicine Clinic Attending  Case discussed with Dr. Rivet soon after the resident saw the patient.  We reviewed the resident's history and exam and pertinent patient test results.  I agree with the assessment, diagnosis, and plan of care documented in the resident's note.  

## 2017-03-03 ENCOUNTER — Other Ambulatory Visit: Payer: Self-pay | Admitting: Internal Medicine

## 2017-03-03 ENCOUNTER — Other Ambulatory Visit: Payer: Self-pay

## 2017-03-03 LAB — COMPREHENSIVE METABOLIC PANEL
ALBUMIN: 2.8 g/dL — AB (ref 3.6–5.1)
ALT: 14 U/L (ref 6–29)
AST: 19 U/L (ref 10–30)
Alkaline Phosphatase: 81 U/L (ref 33–115)
BILIRUBIN TOTAL: 0.4 mg/dL (ref 0.2–1.2)
BUN: 27 mg/dL — ABNORMAL HIGH (ref 7–25)
CALCIUM: 8.6 mg/dL (ref 8.6–10.2)
CO2: 24 mmol/L (ref 20–31)
Chloride: 108 mmol/L (ref 98–110)
Creat: 2.74 mg/dL — ABNORMAL HIGH (ref 0.50–1.10)
GLUCOSE: 161 mg/dL — AB (ref 65–99)
POTASSIUM: 4.1 mmol/L (ref 3.5–5.3)
Sodium: 140 mmol/L (ref 135–146)
Total Protein: 5.4 g/dL — ABNORMAL LOW (ref 6.1–8.1)

## 2017-03-03 LAB — LIPID PANEL
CHOL/HDL RATIO: 3 ratio (ref <5.0)
CHOLESTEROL: 125 mg/dL (ref <200)
HDL: 41 mg/dL — ABNORMAL LOW (ref >50)
LDL Cholesterol: 65 mg/dL (ref <100)
TRIGLYCERIDES: 93 mg/dL (ref <150)
VLDL: 19 mg/dL (ref <30)

## 2017-03-03 MED ORDER — CARVEDILOL 25 MG PO TABS: 25.0000 mg | ORAL_TABLET | Freq: Two times a day (BID) | ORAL | 1 refills | Status: DC

## 2017-03-03 MED ORDER — ASPIRIN EC 81 MG PO TBEC: 81.0000 mg | DELAYED_RELEASE_TABLET | Freq: Two times a day (BID) | ORAL | 1 refills | Status: DC

## 2017-03-03 NOTE — Telephone Encounter (Signed)
carvedilol (COREG) 25 MG tablet, aspirin EC 81 MG tablet, refill request @ [facility].

## 2017-03-06 ENCOUNTER — Other Ambulatory Visit: Payer: Self-pay | Admitting: Internal Medicine

## 2017-03-06 NOTE — Telephone Encounter (Signed)
Confirmed with pharmacy-they have rx for coreg 25mg  tab take one twice daily.  Refill request sent in error for the 12.5mg  tabs.  No further action needed. Phone call complete.[name]4/23/201811:42 AM

## 2017-03-16 ENCOUNTER — Telehealth: Payer: Self-pay | Admitting: *Deleted

## 2017-03-16 DIAGNOSIS — Z79899 Other long term (current) drug therapy: Secondary | ICD-10-CM

## 2017-03-16 DIAGNOSIS — R7989 Other specified abnormal findings of blood chemistry: Secondary | ICD-10-CM

## 2017-03-16 DIAGNOSIS — R799 Abnormal finding of blood chemistry, unspecified: Secondary | ICD-10-CM

## 2017-03-16 NOTE — Telephone Encounter (Signed)
Left message to cal back - in regards to lab results

## 2017-03-16 NOTE — Telephone Encounter (Signed)
-----   Message from [name], MD sent at 03/15/2017 12:20 AM EDT ----- I have reviewed the chemistry findings. Unfortunately the kidney function looks quite abnormal on these current findings and would suggest some dehydration. Glucose levels are high but not dramatically high.  - Recommend increase hydration and we should recheck this week.    Cholesterol panel actually looks very good with total cholesterol being dramatically improved down to 125 from 243. HDL is still a goal of 41 and LDL is down from 146 to 65. --> continue with current dose of Atorvastatin.  Glenetta Hew, MD

## 2017-03-21 NOTE — Telephone Encounter (Signed)
Spoke to patient. She states she does not drink fluids much through the day   encourage her to drink at 8 glasses of water daily,  And to go either Friday 5/11 or 5/14 to the lab. Patient states she will go 5/14. Lab - BMP placed in computer. Patient verbalized understanding.

## 2017-03-22 MED FILL — CLOPIDOGREL 75 MG TABLET: 75 | 30 days supply | Qty: 30 | Fill #10

## 2017-03-22 MED FILL — CARTIA XT 180 MG CAPSULE SA: 180 | 30 days supply | Qty: 30 | Fill #1

## 2017-03-22 MED FILL — MEGESTROL 40 MG TABLET: 40 | 30 days supply | Qty: 30 | Fill #6

## 2017-03-24 ENCOUNTER — Other Ambulatory Visit: Payer: Self-pay | Admitting: Internal Medicine

## 2017-03-24 DIAGNOSIS — J45909 Unspecified asthma, uncomplicated: Secondary | ICD-10-CM

## 2017-03-27 LAB — BASIC METABOLIC PANEL
BUN: 29 mg/dL — AB (ref 7–25)
CALCIUM: 8.3 mg/dL — AB (ref 8.6–10.2)
CO2: 24 mmol/L (ref 20–31)
CREATININE: 3.4 mg/dL — AB (ref 0.50–1.10)
Chloride: 108 mmol/L (ref 98–110)
GLUCOSE: 233 mg/dL — AB (ref 65–99)
POTASSIUM: 4.2 mmol/L (ref 3.5–5.3)
Sodium: 140 mmol/L (ref 135–146)

## 2017-03-29 ENCOUNTER — Other Ambulatory Visit: Payer: Self-pay | Admitting: *Deleted

## 2017-03-30 ENCOUNTER — Other Ambulatory Visit: Payer: Self-pay | Admitting: *Deleted

## 2017-03-30 DIAGNOSIS — E113493 Type 2 diabetes mellitus with severe nonproliferative diabetic retinopathy without macular edema, bilateral: Secondary | ICD-10-CM

## 2017-03-30 DIAGNOSIS — Z794 Long term (current) use of insulin: Principal | ICD-10-CM

## 2017-03-30 MED ORDER — INSULIN PEN NEEDLE 31G X 5 MM MISC: 2 refills | Status: DC

## 2017-03-30 MED ORDER — LIRAGLUTIDE 18 MG/3ML ~~LOC~~ SOPN: 1.2000 mg | PEN_INJECTOR | Freq: Every day | SUBCUTANEOUS | 2 refills | Status: DC

## 2017-03-31 ENCOUNTER — Telehealth: Payer: Self-pay | Admitting: Internal Medicine

## 2017-03-31 NOTE — Telephone Encounter (Signed)
Bmets with Dr. Allison Quarry office show worsening renal function.   In discussion with patient she states that she does not take in too much fluids and will usually only urinate once a day; the last couple of days she has increased fluid intake and has been urinating more frequently. She denies edema, worsening shortness of breath. She denies use of NSAIDs, will only use Tylenol occasionally now that she is being more active with her leg after surgery. She states that usually this time of year she would become dehydrated and has on occasion required IVF. She states that one month ago at time of first Bmet she was experiencing diarrhea but this has resolved; she denies nausea or vomiting.   Patient advised to call clinic this afternoon to set up [facility] appointment early next week. Advised to increase fluid intake and not take NSAIDs. She was advised that if she were to develop any concerning symptoms, worsening edema, oliguria despite increased fluid intake, shortness of breath, chest pain to make earlier appointment or visit ED.   Patient had no further questions.  Alphonzo Grieve, MD IMTS - PGY1 Pager [phone]

## 2017-04-05 ENCOUNTER — Ambulatory Visit: Payer: Self-pay

## 2017-04-13 ENCOUNTER — Ambulatory Visit (INDEPENDENT_AMBULATORY_CARE_PROVIDER_SITE_OTHER): Payer: Medicaid Other | Admitting: Internal Medicine

## 2017-04-13 ENCOUNTER — Encounter: Payer: Self-pay | Admitting: Internal Medicine

## 2017-04-13 VITALS — BP 140/74 | HR 87 | Temp 98.1°F | Ht 65.0 in | Wt 235.6 lb

## 2017-04-13 DIAGNOSIS — N179 Acute kidney failure, unspecified: Secondary | ICD-10-CM | POA: Insufficient documentation

## 2017-04-13 LAB — BASIC METABOLIC PANEL
ANION GAP: 7 (ref 5–15)
BUN: 38 mg/dL — ABNORMAL HIGH (ref 6–20)
CHLORIDE: 107 mmol/L (ref 101–111)
CO2: 21 mmol/L — AB (ref 22–32)
Calcium: 8.2 mg/dL — ABNORMAL LOW (ref 8.9–10.3)
Creatinine, Ser: 3.4 mg/dL — ABNORMAL HIGH (ref 0.44–1.00)
GFR calc Af Amer: 18 mL/min — ABNORMAL LOW (ref >60)
GFR calc non Af Amer: 16 mL/min — ABNORMAL LOW (ref >60)
Glucose, Bld: 124 mg/dL — ABNORMAL HIGH (ref 65–99)
Potassium: 3.9 mmol/L (ref 3.5–5.1)
SODIUM: 135 mmol/L (ref 135–145)

## 2017-04-13 MED FILL — UNIFINE PENTIPS 31GX3/16": 31G X 5 MM | 90 days supply | Qty: 100 | Fill #0

## 2017-04-13 MED FILL — UNIFINE PENTIPS 31GX3/16: 31G X 5 MM | 90 days supply | Qty: 100 | Fill #0

## 2017-04-13 NOTE — Assessment & Plan Note (Addendum)
Unclear etiology of AKI. B/l crt 1.6-1.8,  crt 2.74 on 4/20 and 3.40 on 5/14. She is still taking Lisinopril.  Recently had renal u/s on February which was unremarkable.  Asked her to stop lisinopril Repeat BMET today, check UA If not improving, will require further workup.   Addendum:  crt has remained same, has not worsened. Will recheck BMET next visit to see if holding the lisinopril will reverse the AKI.  If BMET not improving, would consider referral to nephrology.  Discussed with Dr. Heber [place].

## 2017-04-13 NOTE — Progress Notes (Signed)
   CC: f/up kidney function  HPI:  [name] is a 41 y.o. with pmh as listed bleo is here to f/up on kidney function  Had BMET at Dr. [name] cardiology office which showed worsening crt. Stated low PO intake prior to the lab, no NSAID use, no diarrhea currently. Was told to increase her PO intake. Her baseline crt is around 1.6-1.8, but recently she had worsening crt 2.74 on 4/20 and 3.40 on 5/14. She is still taking Lisinopril.   No new meds (not even OTC or vitamins) other than the diltiazem which was started on April. No rash, no fever, no UTI symptoms, no diarrhea. Has been drinking lots of water >64 oz.  Past Medical History:  Diagnosis Date  . Asthma    prn inhaler  . CAD S/P BMS PCI to prox LAD 03/31/2016   Ost LAD to Mid LAD lesion, 90% stenosed. Post intervention - Vision BMS 3.0 mm x 18 mm (~3.5 mm) there is a 0% residual stenosis.   . Charcot foot due to diabetes mellitus ([place]) 11/2016   left  . Depression   . Diabetic neuropathy (HCC)    feet  . H/O non-ST elevation myocardial infarction (NSTEMI) 03/2016   Found in 9% mid LAD lesion treated with bare-metal stent (BMS) PCI - vision BMS 3.0 mm x 18 mm  . History of MRSA infection   . Hyperlipidemia   . Hypertension    medication dose increased 11/22/2015; has been taking med. consistently since 03/2016, per pt.  . Insulin dependent diabetes mellitus ([place])   . Iron deficiency anemia    takes iron supplement  . Retinopathy of both eyes   . Sickle cell trait ([place])   . 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 Negative for fever.  Respiratory: Negative for cough and shortness of breath.   Cardiovascular: Negative for chest pain and palpitations.  Gastrointestinal: Negative for abdominal pain, nausea and vomiting.  Genitourinary: Negative for dysuria.  Neurological: Negative for dizziness and headaches.     Physical  Exam:  There were no vitals filed for this visit. Physical Exam  Constitutional: She is oriented to person, place, and time. She appears well-developed and well-nourished.  Obese female   HENT:  Head: Normocephalic and atraumatic.  Cardiovascular: Normal rate and regular rhythm.  Exam reveals no gallop and no friction rub.   No murmur heard. Respiratory: Effort normal and breath sounds normal. No respiratory distress. She has no wheezes.  Musculoskeletal: Normal range of motion. She exhibits no edema or tenderness.  Neurological: She is alert and oriented to person, place, and time.  Psychiatric: She has a normal mood and affect.    Assessment & Plan:   See Encounters Tab for problem based charting.  Patient discussed with Dr. Angelia Mould

## 2017-04-13 NOTE — Patient Instructions (Signed)
Will check some lab work today  Stop the lisinopril until I call you.

## 2017-04-14 LAB — MICROSCOPIC EXAMINATION: Casts: NONE SEEN /lpf

## 2017-04-14 LAB — URINALYSIS, ROUTINE W REFLEX MICROSCOPIC
Bilirubin, UA: NEGATIVE
KETONES UA: NEGATIVE
Leukocytes, UA: NEGATIVE
NITRITE UA: NEGATIVE
SPEC GRAV UA: 1.016 (ref 1.005–1.030)
Urobilinogen, Ur: 0.2 mg/dL (ref 0.2–1.0)
pH, UA: 5.5 (ref 5.0–7.5)

## 2017-04-14 NOTE — Progress Notes (Signed)
Internal Medicine Clinic Attending  Case discussed with Dr. Ahmed at the time of the visit.  We reviewed the resident's history and exam and pertinent patient test results.  I agree with the assessment, diagnosis, and plan of care documented in the resident's note. 

## 2017-04-28 MED FILL — CLOPIDOGREL 75 MG TABLET: 75 | 30 days supply | Qty: 30 | Fill #11

## 2017-04-28 MED FILL — CARTIA XT 180 MG CAPSULE SA: 180 | 30 days supply | Qty: 30 | Fill #2

## 2017-04-28 MED FILL — MEGESTROL 40 MG TABLET: 40 | 30 days supply | Qty: 30 | Fill #7

## 2017-05-18 ENCOUNTER — Telehealth: Payer: Self-pay | Admitting: Dietician

## 2017-05-18 DIAGNOSIS — E113493 Type 2 diabetes mellitus with severe nonproliferative diabetic retinopathy without macular edema, bilateral: Secondary | ICD-10-CM

## 2017-05-18 DIAGNOSIS — Z794 Long term (current) use of insulin: Principal | ICD-10-CM

## 2017-05-18 MED ORDER — ACCU-CHEK GUIDE W/DEVICE KIT: 1.0000 | PACK | Freq: Three times a day (TID) | 0 refills | Status: DC

## 2017-05-18 MED FILL — ACCU-CHEK GUIDE MONITOR SYS: W/DEVICE | 1 days supply | Qty: 1 | Fill #0

## 2017-05-18 NOTE — Telephone Encounter (Signed)
Called to follow up with patient about her blood sugars since starting victoza and suggest she make a follow up appointment to recheck her BMET.  She says she still does not have a battery for her meter. She has not been checking her blood sugar. However, she says her sugar dropped on a saturday so lowf that the paramedics were called and her sugar was 32.   She'd like a new meter prescription sent to [facility] ([facility]).  I explained reason for follow up to patient and transferred her call to the front office to schedule that appointment.

## 2017-05-23 ENCOUNTER — Other Ambulatory Visit: Payer: Self-pay | Admitting: Internal Medicine

## 2017-05-23 DIAGNOSIS — Z955 Presence of coronary angioplasty implant and graft: Secondary | ICD-10-CM

## 2017-05-23 DIAGNOSIS — Z79899 Other long term (current) drug therapy: Secondary | ICD-10-CM

## 2017-05-23 MED FILL — VICTOZA 2-PAK 18 MG/3 ML PE: 18 | 28 days supply | Qty: 6 | Fill #0

## 2017-05-23 MED FILL — CARTIA XT 180 MG CAPSULE SA: 180 | 30 days supply | Qty: 30 | Fill #3

## 2017-05-24 ENCOUNTER — Telehealth: Payer: Self-pay | Admitting: Obstetrics & Gynecology

## 2017-05-24 DIAGNOSIS — Z79899 Other long term (current) drug therapy: Secondary | ICD-10-CM

## 2017-05-24 MED FILL — CLOPIDOGREL 75 MG TABLET: 75 | 30 days supply | Qty: 30 | Fill #0

## 2017-05-24 NOTE — Telephone Encounter (Signed)
Patient stated she called her pharmacist for her megace refill, but no one has taken care of it yet. She is requesting some medication until she comes to her appointment in August.

## 2017-05-25 MED ORDER — MEGESTROL ACETATE 40 MG PO TABS: 40.0000 mg | ORAL_TABLET | Freq: Every day | ORAL | 1 refills | Status: DC

## 2017-05-25 MED ORDER — MEGESTROL ACETATE 40 MG PO TABS: 40.0000 mg | ORAL_TABLET | Freq: Every day | ORAL | 3 refills | Status: DC

## 2017-05-25 MED FILL — MEGESTROL 40 MG TABLET: 40 | 30 days supply | Qty: 30 | Fill #0

## 2017-05-25 NOTE — Addendum Note (Signed)
Addended by: Langston Reusing on: 05/25/2017 01:59 PM   Modules accepted: Orders

## 2017-05-25 NOTE — Telephone Encounter (Addendum)
Message sent to Dr. Ihor Dow @ [phone] today stating pt requests refill of Megace.  Pt will be notified once a response has been received.   1350  Message received from Dr. Ihor Dow authorizing refill of Megace. Rx sent, pt notified and voiced understanding.

## 2017-06-07 ENCOUNTER — Telehealth: Payer: Self-pay | Admitting: Pharmacist

## 2017-06-13 NOTE — Progress Notes (Addendum)
Called patient for medication follow up. Patient doing well but needs help transferring medications due to now having Medicaid, she requested an appointment with me for a medication review, appointment scheduled.

## 2017-06-20 NOTE — Progress Notes (Addendum)
CC: diabetes, renal dysfunction  HPI:  [name] is a 41 y.o. with a PMH of HTN, HLD, CAD s/p LAD stent 2017, T2DM with NPDR, CKD stage 3, and iron deficiency anemia 2/2 dysfunctional uterine bleeding presenting to clinic for f/u on her diabetes and CKD.  CKD:  Patient has had progressive CKD since 2015 that has dramatically worsened in the last couple of months. She has been on lisinopril to help control her HTN during this time; she has had BP medication adjustments by [facility] and her cardiologist in an attempt to maximize control. Timing of worsening was not correlated with any known antibiotic use, contrast use, known hypotension; she does not have known CHF, autoimmune disease, FHx of autoimmune disease or renal disease. Patient states she has good urine output, though has noted increased frothiness; she denies hematuria. Patient states she has good appetite, denies N/V. She has chronic atypical chest pain that is unchanged; she has fatigue with exertion though has not been ambulatory very much due to her Charcot foot in the last year. Renal U/S 12/2016 was unremarkable.  T2DM: Patient with last A1c of 8.0; her current regimen includes liraglutide 1.2mg  daily and insulin mix 70/30 16 units BID. She had one episode of hypoglycemia to 32 early this summer which was managed by EMS; patient states it was related to her not eating while she had URI. She is still working on eating regular meals; she has a good appetite but her foot pain and fatigue on exertion limit her mobility at times.  Please see problem based Assessment and Plan for status of patients chronic conditions.  Past Medical History:  Diagnosis Date  . Asthma    prn inhaler  . CAD S/P BMS PCI to prox LAD 03/31/2016   Ost LAD to Mid LAD lesion, 90% stenosed. Post intervention - Vision BMS 3.0 mm x 18 mm (~3.5 mm) there is a 0% residual stenosis.   . Charcot foot due to diabetes mellitus ([place]) 11/2016   left  .  Depression   . Diabetic neuropathy (HCC)    feet  . H/O non-ST elevation myocardial infarction (NSTEMI) 03/2016   Found in 9% mid LAD lesion treated with bare-metal stent (BMS) PCI - vision BMS 3.0 mm x 18 mm  . History of MRSA infection   . Hyperlipidemia   . Hypertension    medication dose increased 11/22/2015; has been taking med. consistently since 03/2016, per pt.  . Insulin dependent diabetes mellitus ([place])   . Iron deficiency anemia    takes iron supplement  . Retinopathy of both eyes   . Sickle cell trait ([place])   . 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  Eyes: Negative for blurred vision and double vision.  Respiratory: Negative for cough and shortness of breath.   Cardiovascular: Positive for chest pain (chronic). Negative for leg swelling.  Gastrointestinal: Negative for abdominal pain, constipation, diarrhea, nausea and vomiting.  Genitourinary: Negative for dysuria, flank pain, frequency, hematuria and urgency.  Skin: Negative for rash.  Neurological: Positive for weakness. Negative for headaches.  Endo/Heme/Allergies: Negative for polydipsia.    Physical Exam:  Vitals:   06/21/17 1435  BP: (!) 152/74  Pulse: 86  Temp: 98.6 F (37 C)  TempSrc: Oral  SpO2: 100%  Weight: 238 lb 8 oz (108.2 kg)   Physical Exam  Constitutional: She is oriented to person, place, and  time. She appears well-developed and well-nourished. No distress.  HENT:  Head: Normocephalic and atraumatic.  Eyes: EOM are normal. No scleral icterus.  Neck: Normal range of motion.  Cardiovascular: Normal rate, regular rhythm, normal heart sounds and intact distal pulses.   Pulmonary/Chest: Effort normal. She has no wheezes. She has no rales.  Abdominal: Soft. Bowel sounds are normal. There is no tenderness.  Musculoskeletal: Normal range of motion. She exhibits edema (1+ pitting edema in bil LE pretibial).    Left foot and lower leg in boot for Charcot foot  Neurological: She is alert and oriented to person, place, and time.  Skin: Skin is warm and dry. Capillary refill takes less than 2 seconds. No rash noted. She is not diaphoretic. No erythema. No pallor.  Psychiatric: She has a normal mood and affect. Her behavior is normal. Judgment and thought content normal.    Assessment & Plan:   See Encounters Tab for problem based charting.   Patient discussed with Dr. Lynnae January.   Alphonzo Grieve, MD Internal Medicine PGY2

## 2017-06-21 ENCOUNTER — Ambulatory Visit: Payer: Self-pay | Admitting: Pharmacist

## 2017-06-21 ENCOUNTER — Ambulatory Visit (INDEPENDENT_AMBULATORY_CARE_PROVIDER_SITE_OTHER): Payer: Medicaid Other | Admitting: Internal Medicine

## 2017-06-21 ENCOUNTER — Ambulatory Visit: Payer: Self-pay | Admitting: Dietician

## 2017-06-21 VITALS — BP 152/74 | HR 86 | Temp 98.6°F | Wt 238.5 lb

## 2017-06-21 DIAGNOSIS — D500 Iron deficiency anemia secondary to blood loss (chronic): Secondary | ICD-10-CM

## 2017-06-21 DIAGNOSIS — N938 Other specified abnormal uterine and vaginal bleeding: Secondary | ICD-10-CM | POA: Diagnosis not present

## 2017-06-21 DIAGNOSIS — Z79899 Other long term (current) drug therapy: Secondary | ICD-10-CM

## 2017-06-21 DIAGNOSIS — E113493 Type 2 diabetes mellitus with severe nonproliferative diabetic retinopathy without macular edema, bilateral: Secondary | ICD-10-CM | POA: Diagnosis present

## 2017-06-21 DIAGNOSIS — I129 Hypertensive chronic kidney disease with stage 1 through stage 4 chronic kidney disease, or unspecified chronic kidney disease: Secondary | ICD-10-CM | POA: Diagnosis not present

## 2017-06-21 DIAGNOSIS — E1122 Type 2 diabetes mellitus with diabetic chronic kidney disease: Secondary | ICD-10-CM

## 2017-06-21 DIAGNOSIS — E11610 Type 2 diabetes mellitus with diabetic neuropathic arthropathy: Secondary | ICD-10-CM | POA: Diagnosis not present

## 2017-06-21 DIAGNOSIS — Z794 Long term (current) use of insulin: Secondary | ICD-10-CM | POA: Diagnosis not present

## 2017-06-21 DIAGNOSIS — Z87891 Personal history of nicotine dependence: Secondary | ICD-10-CM

## 2017-06-21 DIAGNOSIS — D508 Other iron deficiency anemias: Secondary | ICD-10-CM

## 2017-06-21 DIAGNOSIS — N184 Chronic kidney disease, stage 4 (severe): Secondary | ICD-10-CM

## 2017-06-21 DIAGNOSIS — I10 Essential (primary) hypertension: Secondary | ICD-10-CM

## 2017-06-22 ENCOUNTER — Encounter: Payer: Self-pay | Admitting: Internal Medicine

## 2017-06-22 LAB — POCT GLYCOSYLATED HEMOGLOBIN (HGB A1C): Hemoglobin A1C: 7.3

## 2017-06-22 LAB — GLUCOSE, CAPILLARY: Glucose-Capillary: 126 mg/dL — ABNORMAL HIGH (ref 65–99)

## 2017-06-22 MED ORDER — LIRAGLUTIDE 18 MG/3ML ~~LOC~~ SOPN: 1.2000 mg | PEN_INJECTOR | Freq: Every day | SUBCUTANEOUS | 2 refills | Status: DC

## 2017-06-22 MED ORDER — DILTIAZEM HCL ER COATED BEADS 240 MG PO CP24: 240.0000 mg | ORAL_CAPSULE | Freq: Every day | ORAL | 3 refills | Status: DC

## 2017-06-22 MED FILL — CARTIA XT 240 MG CAPSULE: 240 | 90 days supply | Qty: 90 | Fill #0

## 2017-06-22 NOTE — Assessment & Plan Note (Signed)
Patient with last A1c of 8.0; her current regimen includes liraglutide 1.2mg  daily and insulin mix 70/30 16 units BID. She had one episode of hypoglycemia to 32 early this summer which was managed by EMS; patient states it was related to her not eating while she had URI. She is still working on eating regular meals; she has a good appetite but her foot pain and fatigue on exertion limit her mobility at times.  Patient maintains compliance with her insulin and victoza.   Plan: --A1c - 7.3; CBG 126 --continue current regimen of Humulin 70/30 mix 16 units twice daily with meals and victoza 1.2mg  daily --advised on eating regular meals throughout the day --advised on appropriate identification and treatment of hypoglycemia

## 2017-06-22 NOTE — Addendum Note (Signed)
Addended by: Alphonzo Grieve on: 06/22/2017 10:50 AM   Modules accepted: Orders, Level of Service

## 2017-06-22 NOTE — Assessment & Plan Note (Addendum)
Patient has had progressive CKD since 2015 that has dramatically worsened in the last couple of months. She has been on lisinopril to help control her HTN during this time; she has had BP medication adjustments by [facility] and her cardiologist in an attempt to maximize control. Timing of worsening was not correlated with any known antibiotic use, contrast use, known hypotension; she does not have known CHF, autoimmune disease, FHx of autoimmune disease or renal disease.  Patient states she has good urine output, though has noted increased frothiness; she denies hematuria. Patient states she has good appetite, denies N/V. She has chronic atypical chest pain that is unchanged; she has fatigue with exertion though has not been ambulatory very much due to her Charcot foot in the last year. Renal U/S 12/2016 was unremarkable.  Exam is remarkable for 1+ pitting LE edema.  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 I called and discussed results and plan with patient who is in agreement. --start lasix 40mg  daily (lasix naive patient) --f/u in one week for bmet --advised fluid and Na restriction --patient admitted to not taking Fe supplementation - will restart --advised on likely progression to dialysis dependence in the coming months  Will need HBV vaccination series, and PCV13 vaccination

## 2017-06-22 NOTE — Addendum Note (Signed)
Addended by: Truddie Crumble on: 06/22/2017 09:30 AM   Modules accepted: Orders

## 2017-06-22 NOTE — Assessment & Plan Note (Signed)
F/u CBC

## 2017-06-22 NOTE — Patient Instructions (Signed)
For your blood pressure, I sent in a higher dose of diltiazem to 240mg  daily. Continue your carvedilol 25mg  twice a day.  For your diabetes, continue your victoza and insulin at current doses; try to eat consistent meals to avoid low blood sugars. Please check your blood sugar first thing in the morning and a couple times a day.   For your kidney disease, we are referring you to a kidney specialist. Please limit fluid intake to 2 liters (~60 ounces) of fluid in a day and your sodium intake to less than 2000mg  per day. Sodium is found in table salt, fried foods, pizza, canned foods, frozen pre-prepared meals.

## 2017-06-22 NOTE — Assessment & Plan Note (Signed)
Hypertensive today to 152/74; asymptomatic. Lisinopril was stopped at last visit due to renal dysfunction; will not restart.   Plan: --continue carvedilol 25mg  BID --increase diltiazem to 240mg  daily

## 2017-06-22 NOTE — Addendum Note (Signed)
Addended by: Truddie Crumble on: 06/22/2017 10:58 AM   Modules accepted: Orders

## 2017-06-22 NOTE — Assessment & Plan Note (Signed)
Follows with OB/gyn; compliant with megace. Will see OB/Gyn next week; they follow her for Paps as well.

## 2017-06-23 MED ORDER — FUROSEMIDE 40 MG PO TABS: 40.0000 mg | ORAL_TABLET | Freq: Every day | ORAL | 2 refills | Status: DC

## 2017-06-23 MED FILL — FUROSEMIDE 40 MG TABLET: 40 | 30 days supply | Qty: 30 | Fill #0

## 2017-06-23 NOTE — Addendum Note (Signed)
Addended by: Alphonzo Grieve on: 06/23/2017 11:57 AM   Modules accepted: Orders

## 2017-06-26 ENCOUNTER — Other Ambulatory Visit: Payer: Self-pay | Admitting: Pharmacist

## 2017-06-26 ENCOUNTER — Other Ambulatory Visit: Payer: Self-pay

## 2017-06-26 DIAGNOSIS — I2510 Atherosclerotic heart disease of native coronary artery without angina pectoris: Secondary | ICD-10-CM

## 2017-06-26 DIAGNOSIS — Z794 Long term (current) use of insulin: Principal | ICD-10-CM

## 2017-06-26 DIAGNOSIS — Z9861 Coronary angioplasty status: Principal | ICD-10-CM

## 2017-06-26 DIAGNOSIS — E113493 Type 2 diabetes mellitus with severe nonproliferative diabetic retinopathy without macular edema, bilateral: Secondary | ICD-10-CM

## 2017-06-26 DIAGNOSIS — I10 Essential (primary) hypertension: Secondary | ICD-10-CM

## 2017-06-26 MED ORDER — ATORVASTATIN CALCIUM 40 MG PO TABS: 40.0000 mg | ORAL_TABLET | Freq: Every day | ORAL | 11 refills | Status: DC

## 2017-06-26 MED ORDER — ASPIRIN EC 81 MG PO TBEC: 81.0000 mg | DELAYED_RELEASE_TABLET | Freq: Two times a day (BID) | ORAL | 1 refills | Status: DC

## 2017-06-26 MED ORDER — CARVEDILOL 25 MG PO TABS: 25.0000 mg | ORAL_TABLET | Freq: Two times a day (BID) | ORAL | 3 refills | Status: DC

## 2017-06-26 MED ORDER — DILTIAZEM HCL ER COATED BEADS 240 MG PO CP24: 240.0000 mg | ORAL_CAPSULE | Freq: Every day | ORAL | 3 refills | Status: DC

## 2017-06-26 MED FILL — CLOPIDOGREL 75 MG TABLET: 75 | 30 days supply | Qty: 30 | Fill #1

## 2017-06-26 MED FILL — MEGESTROL 40 MG TABLET: 40 | 30 days supply | Qty: 30 | Fill #1

## 2017-06-26 MED FILL — CARVEDILOL 25 MG TABLET: 25 | 30 days supply | Qty: 60 | Fill #0

## 2017-06-26 NOTE — Telephone Encounter (Signed)
Requesting acc-chek guide lancets to be filled @ outpatient pharmacy.

## 2017-06-26 NOTE — Progress Notes (Signed)
Internal Medicine Clinic Attending  Case discussed with Dr. Svalina  at the time of the visit.  We reviewed the resident's history and exam and pertinent patient test results.  I agree with the assessment, diagnosis, and plan of care documented in the resident's note.  

## 2017-06-26 NOTE — Progress Notes (Signed)
Patient requested prescription transfers to [facility].  Prescriptions sent.

## 2017-06-27 ENCOUNTER — Ambulatory Visit: Payer: Self-pay | Admitting: Cardiology

## 2017-06-27 ENCOUNTER — Other Ambulatory Visit: Payer: Self-pay | Admitting: *Deleted

## 2017-06-27 ENCOUNTER — Encounter: Payer: Self-pay | Admitting: Obstetrics & Gynecology

## 2017-06-27 ENCOUNTER — Ambulatory Visit (INDEPENDENT_AMBULATORY_CARE_PROVIDER_SITE_OTHER): Payer: Medicaid Other | Admitting: Obstetrics & Gynecology

## 2017-06-27 VITALS — BP 135/75 | HR 96 | Ht 65.0 in | Wt 230.4 lb

## 2017-06-27 DIAGNOSIS — N939 Abnormal uterine and vaginal bleeding, unspecified: Secondary | ICD-10-CM | POA: Diagnosis present

## 2017-06-27 DIAGNOSIS — Z79899 Other long term (current) drug therapy: Secondary | ICD-10-CM | POA: Diagnosis not present

## 2017-06-27 DIAGNOSIS — Z794 Long term (current) use of insulin: Principal | ICD-10-CM

## 2017-06-27 DIAGNOSIS — Z1239 Encounter for other screening for malignant neoplasm of breast: Secondary | ICD-10-CM

## 2017-06-27 DIAGNOSIS — E113493 Type 2 diabetes mellitus with severe nonproliferative diabetic retinopathy without macular edema, bilateral: Secondary | ICD-10-CM

## 2017-06-27 MED ORDER — ACCU-CHEK FASTCLIX LANCETS MISC
1 refills | Status: DC
Start: 2017-06-27 — End: 2018-08-10

## 2017-06-27 MED ORDER — MEGESTROL ACETATE 40 MG PO TABS: 40.0000 mg | ORAL_TABLET | Freq: Every day | ORAL | 1 refills | Status: DC

## 2017-06-27 MED ORDER — GLUCOSE BLOOD VI STRP: ORAL_STRIP | 12 refills | Status: DC

## 2017-06-27 MED FILL — ACCU-CHEK FASTCLIX LANCETS: 30 days supply | Qty: 102 | Fill #0

## 2017-06-27 NOTE — Progress Notes (Signed)
History:  41 y.o. [id number] here today for f/u of AUB. Pt is currently on Megace 40mg  daily. Pt has had no bleeding. No problems or side effect. Pt had foot surgery Nov 24, 2016 and is non weight bearing for 1 year.  She reports that she will come in for an annual with GYN if scheduled.     The following portions of the patient's history were reviewed and updated as appropriate: allergies, current medications, past family history, past medical history, past social history, past surgical history and problem list.  Review of Systems:  Pertinent items are noted in HPI.   Objective:  Physical Exam Blood pressure 135/75, pulse 96, height 5\' 5"  (1.651 m), weight 230 lb 6.4 oz (104.5 kg).  CONSTITUTIONAL: Well-developed, well-nourished female in no acute distress.  HENT:  Normocephalic, atraumatic EYES: Conjunctivae and EOM are normal. No scleral icterus.  NECK: Normal range of motion SKIN: Skin is warm and dry. No rash noted. Not diaphoretic.No pallor. [place]: Alert and oriented to person, place, and time. Pelvic exam: not done.   Labs and Imaging No results found.  Assessment & Plan:  Health maintaninence-  Schedule annual to PAP. Pt will f/u for next available annual   Schedule for mammogram  AUB-  Megace 40mg  q day only 1 RF given    Amanda Steuart L. Harraway-Smith, M.D., [name]

## 2017-06-28 ENCOUNTER — Ambulatory Visit (INDEPENDENT_AMBULATORY_CARE_PROVIDER_SITE_OTHER): Payer: Medicaid Other | Admitting: Internal Medicine

## 2017-06-28 VITALS — BP 125/77 | HR 87 | Temp 98.2°F | Wt 231.7 lb

## 2017-06-28 DIAGNOSIS — I10 Essential (primary) hypertension: Secondary | ICD-10-CM

## 2017-06-28 DIAGNOSIS — I129 Hypertensive chronic kidney disease with stage 1 through stage 4 chronic kidney disease, or unspecified chronic kidney disease: Secondary | ICD-10-CM | POA: Diagnosis not present

## 2017-06-28 DIAGNOSIS — N184 Chronic kidney disease, stage 4 (severe): Secondary | ICD-10-CM

## 2017-06-28 DIAGNOSIS — Z79899 Other long term (current) drug therapy: Secondary | ICD-10-CM | POA: Diagnosis not present

## 2017-06-28 DIAGNOSIS — Z87891 Personal history of nicotine dependence: Secondary | ICD-10-CM | POA: Diagnosis not present

## 2017-06-28 NOTE — Patient Instructions (Signed)
Thank you for visiting clinic today. We will check your renal functions, and will call you with the results. Have already put your referral for kidney doctor, you can follow up by calling the number provided to you today. Your blood pressure seems very good, keep up the good work. Please follow-up with your PCP as directed.

## 2017-06-28 NOTE — Assessment & Plan Note (Addendum)
She is having rapidly progressive renal function with nephrotic range proteinuria. Her workup for parathyroid and autoimmune and hepatitis C was within normal limit. She is now stage V. She was recently started on Lasix 40 mg daily. She was referred for nephrology, has not make an appointment yet.  -repeat renal function, as she was started on Lasix recently. -Patient was also provided with [place] [facility] for number so she can call them for follow-up.  Addendum.She is having worsening renal function. She was provided with nephrology referral during previous office visit. I called the patient and she has not make any appointments yet. I told her about the results and asked her to make an appointment as soon as possible. She is having high phosphate level and low bicarbonate, should be on PhosLo and bicarbonate supplement. I will defer this decision to nephrologist.

## 2017-06-28 NOTE — Progress Notes (Signed)
   CC: For follow-up of her blood pressure and kidney functions.  HPI:  [name] is a 41 y.o. lady with past medical history as listed below, came to the clinic for follow-up of her blood pressure and renal function.  She had no complaints today.  Please see assessment and plan for her chronic problems.  Past Medical History:  Diagnosis Date  . Asthma    prn inhaler  . CAD S/P BMS PCI to prox LAD 03/31/2016   Ost LAD to Mid LAD lesion, 90% stenosed. Post intervention - Vision BMS 3.0 mm x 18 mm (~3.5 mm) there is a 0% residual stenosis.   . Charcot foot due to diabetes mellitus ([place]) 11/2016   left  . Depression   . Diabetic neuropathy (HCC)    feet  . H/O non-ST elevation myocardial infarction (NSTEMI) 03/2016   Found in 9% mid LAD lesion treated with bare-metal stent (BMS) PCI - vision BMS 3.0 mm x 18 mm  . History of MRSA infection   . Hyperlipidemia   . Hypertension    medication dose increased 11/22/2015; has been taking med. consistently since 03/2016, per pt.  . Insulin dependent diabetes mellitus ([place])   . Iron deficiency anemia    takes iron supplement  . Retinopathy of both eyes   . Sickle cell trait ([place])   . 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  Physical Exam:  Vitals:   06/28/17 0912  BP: 125/77  Pulse: 87  Temp: 98.2 F (36.8 C)  TempSrc: Oral  SpO2: 100%  Weight: 231 lb 11.2 oz (105.1 kg)    General: Vital signs reviewed.  Patient is well-developed and well-nourished, in no acute distress and cooperative with exam.  Cardiovascular: RRR, S1 normal, S2 normal, no murmurs, gallops, or rubs. Pulmonary/Chest: Clear to auscultation bilaterally, no wheezes, rales, or rhonchi. Abdominal: Soft, non-tender, non-distended, BS +, no masses, organomegaly, or guarding present.  Extremities: left foot and orthopedic boot, No lower extremity edema on right.,   Skin: Warm, dry and intact. No rashes or  erythema. Psychiatric: Normal mood and affect. speech and behavior is normal. Cognition and memory are normal.  Assessment & Plan:   See Encounters Tab for problem based charting.  Patient discussed with Dr. Dareen Piano.

## 2017-06-28 NOTE — Assessment & Plan Note (Signed)
BP Readings from Last 3 Encounters:  06/28/17 125/77  06/27/17 135/75  06/21/17 (!) 152/74   She was normotensive today. Her diltiazem was increased to 240 mg daily during his last follow-up visit. She was also started on Lasix 40 mg daily recently.  -Continue current management. -Check electrolyte.

## 2017-06-29 ENCOUNTER — Encounter: Payer: Self-pay | Admitting: Obstetrics & Gynecology

## 2017-06-29 LAB — RENAL FUNCTION PANEL
ALBUMIN: 3.2 g/dL — AB (ref 3.5–5.5)
BUN/Creatinine Ratio: 12 (ref 9–23)
BUN: 64 mg/dL — ABNORMAL HIGH (ref 6–24)
CO2: 18 mmol/L — ABNORMAL LOW (ref 20–29)
CREATININE: 5.37 mg/dL — AB (ref 0.57–1.00)
Calcium: 8.4 mg/dL — ABNORMAL LOW (ref 8.7–10.2)
Chloride: 108 mmol/L — ABNORMAL HIGH (ref 96–106)
GFR calc Af Amer: 11 mL/min/{1.73_m2} — ABNORMAL LOW (ref >59)
GFR, EST NON AFRICAN AMERICAN: 9 mL/min/{1.73_m2} — AB (ref >59)
Glucose: 146 mg/dL — ABNORMAL HIGH (ref 65–99)
PHOSPHORUS: 5.7 mg/dL — AB (ref 2.5–4.5)
Potassium: 4.4 mmol/L (ref 3.5–5.2)
Sodium: 142 mmol/L (ref 134–144)

## 2017-06-30 NOTE — Progress Notes (Signed)
Internal Medicine Clinic Attending  Case discussed with Dr. Amin at the time of the visit.  We reviewed the resident's history and exam and pertinent patient test results.  I agree with the assessment, diagnosis, and plan of care documented in the resident's note.    

## 2017-07-04 ENCOUNTER — Encounter: Payer: Self-pay | Admitting: Cardiology

## 2017-07-04 ENCOUNTER — Ambulatory Visit (INDEPENDENT_AMBULATORY_CARE_PROVIDER_SITE_OTHER): Payer: Medicaid Other | Admitting: Cardiology

## 2017-07-04 VITALS — BP 144/88 | HR 96 | Ht 65.0 in | Wt 226.0 lb

## 2017-07-04 DIAGNOSIS — N170 Acute kidney failure with tubular necrosis: Secondary | ICD-10-CM

## 2017-07-04 DIAGNOSIS — Z79899 Other long term (current) drug therapy: Secondary | ICD-10-CM | POA: Diagnosis not present

## 2017-07-04 DIAGNOSIS — E1169 Type 2 diabetes mellitus with other specified complication: Secondary | ICD-10-CM | POA: Diagnosis not present

## 2017-07-04 DIAGNOSIS — E668 Other obesity: Secondary | ICD-10-CM | POA: Diagnosis not present

## 2017-07-04 DIAGNOSIS — N183 Chronic kidney disease, stage 3 unspecified: Secondary | ICD-10-CM | POA: Insufficient documentation

## 2017-07-04 DIAGNOSIS — Z9861 Coronary angioplasty status: Secondary | ICD-10-CM | POA: Diagnosis not present

## 2017-07-04 DIAGNOSIS — I2510 Atherosclerotic heart disease of native coronary artery without angina pectoris: Secondary | ICD-10-CM

## 2017-07-04 DIAGNOSIS — I10 Essential (primary) hypertension: Secondary | ICD-10-CM | POA: Diagnosis not present

## 2017-07-04 DIAGNOSIS — E785 Hyperlipidemia, unspecified: Secondary | ICD-10-CM | POA: Diagnosis not present

## 2017-07-04 DIAGNOSIS — I779 Disorder of arteries and arterioles, unspecified: Secondary | ICD-10-CM

## 2017-07-04 DIAGNOSIS — Z955 Presence of coronary angioplasty implant and graft: Secondary | ICD-10-CM | POA: Diagnosis not present

## 2017-07-04 DIAGNOSIS — R000 Tachycardia, unspecified: Secondary | ICD-10-CM | POA: Diagnosis not present

## 2017-07-04 DIAGNOSIS — N179 Acute kidney failure, unspecified: Secondary | ICD-10-CM | POA: Insufficient documentation

## 2017-07-04 DIAGNOSIS — E669 Obesity, unspecified: Secondary | ICD-10-CM

## 2017-07-04 NOTE — Progress Notes (Signed)
PCP: Alphonzo Grieve, MD  Clinic Note: Chief Complaint  Patient presents with  . Follow-up    Acute on chronic renal failure  . Coronary Artery Disease    HPI: Jane [name] is a 41 y.o. female with a PMH below who presents today for Four-month follow-up of CAD-PCI to LAD in May 2017 in the setting of non-STEMI. She was treated with bare-metal stent. .She has very poorly controlled diabetes with significant neuropathy and resultant Charcot foot left foot.  Probably as a result of her diabetes, she really does not recall any true anginal symptoms at the time of MI. She basically was dyspneic and had some GI symptoms. She has recently been evaluated for acute renal failure with labs checked as part of routine follow-up -> was told to hold her ACE inhibitor.  Jane [name] was last seen on 02/23/2017. I started her on diltiazem - This is been increased up to 240 mg daily. She is now off of her ACE inhibitor because of worsening renal function. -- Was actually started on Lasix recently.  Recent Hospitalizations: None --- Partially as a result of screening chemistry panel with her lipid panel that was just recently checked showing worsening creatinine, I sent her back to her primary physician. She is now suddenly had worsening of renal function with creatinine of 5.0 5.5 range and is being referred to nephrology for possible nephrotic range proteinuria. She is still making stable urine and has not noticed any dysuria.  Studies Personally Reviewed - (if available, images/films reviewed: From Epic Chart or Care Everywhere) - No new cardiac studies Lab Results  Component Value Date   CREATININE 5.37 (H) 06/28/2017   CREATININE 3.40 (H) 04/13/2017   CREATININE 3.40 (H) 03/27/2017    Interval History: Jane [name] presents today for routine follow-up actually in pretty good spirits. She now has a more permanent boot cast on her left foot and says it is hurting a little bit  today more so than usual and thinks is probably why her blood pressure is higher. Her last couple checks during PCP visits of some relatively normal blood pressures. She is a little bit concerned about her worsening renal function and having to go see a nephrologist, but really is not noticing any untoward symptoms. She is not having any more than usual exertional dyspnea, and is probably more limited by her foot pain then dyspnea. She is clearly deconditioned because she is not doing exercise since the onset of foot pain. She's not had any syncope or near-syncopal symptoms. She does get somewhat dizzy though and can happen just sitting in exam room or sitting up in bed, or even walking. Nothing really particularly the associated with any one activity. She has not had any exertional or resting chest pain, nor has she had any resting dyspnea. He does not get short of breath with simple activity, but if she "tries to walk fast or for longer period of time she will notice it".   No PND, orthopnea or significant edema. No palpitations, lightheadedness, dizziness, weakness or syncope/near syncope. No TIA/amaurosis fugax symptoms. No melena, hematochezia, hematuria, or epstaxis. No claudication.  ROS: A comprehensive was performed. Review of Systems  Constitutional: Negative for chills and fever.  Respiratory: Positive for shortness of breath. Negative for cough and sputum production.   Cardiovascular: Positive for leg swelling (Mild).  Gastrointestinal: Positive for constipation, diarrhea and nausea (off & on).  Musculoskeletal: Negative for falls and myalgias.  Left foot pain  Neurological: Negative for dizziness.  All other systems reviewed and are negative.   I have reviewed and (if needed) personally updated the patient's problem list, medications, allergies, past medical and surgical history, social and family history.   Past Medical History:  Diagnosis Date  . Asthma    prn inhaler    . CAD S/P BMS PCI to prox LAD 03/31/2016   Ost LAD to Mid LAD lesion, 90% stenosed. Post intervention - Vision BMS 3.0 mm x 18 mm (~3.5 mm) there is a 0% residual stenosis.   . Charcot foot due to diabetes mellitus ([place]) 11/2016   left  . Depression   . Diabetic neuropathy (HCC)    feet  . H/O non-ST elevation myocardial infarction (NSTEMI) 03/2016   Found in 9% mid LAD lesion treated with bare-metal stent (BMS) PCI - vision BMS 3.0 mm x 18 mm  . History of MRSA infection   . Hyperlipidemia   . Hypertension    medication dose increased 11/22/2015; has been taking med. consistently since 03/2016, per pt.  . Insulin dependent diabetes mellitus ([place])   . Iron deficiency anemia    takes iron supplement  . Retinopathy of both eyes   . Sickle cell trait ([place])   . 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 ACHILLES TENDON LENGTHENING Left 11/24/2016   Procedure: Left Achilles tendon lengthening (open);  Surgeon: Wylene Simmer, MD;  Location: [facility];  Service: Orthopedics;  Laterality: Left;  . CALCANEAL OSTEOTOMY Left 11/24/2016   Procedure: Left hindfoot osteotomy and fusion;  Surgeon: Wylene Simmer, MD;  Location: [facility];  Service: Orthopedics;  Laterality: Left;  . CARDIAC CATHETERIZATION N/A 03/31/2016   Procedure: Left Heart Cath and Coronary Angiography;  Surgeon: Lorretta Harp, MD;  Location: [facility] INVASIVE CV LAB: 90% early mLAD, normal LV Fxn  . CARDIAC CATHETERIZATION N/A 03/31/2016   Procedure: Coronary Stent Intervention;  Surgeon: Lorretta Harp, MD;  Location: [facility] CV LAB;  Service: Cardiovascular: PCI to mLAD BMS Vision 3.0 mm x 18 mm  . CESAREAN SECTION  1997  . TRANSTHORACIC ECHOCARDIOGRAM  03/2016   Normal LV size and thickness. EF 60-65%. GR 1 DD. Otherwise essentially normal.  . TUBAL LIGATION  1997     Cardiac catheterization with PCI May 2017: Diagnostic Diagram - 90% mLAD;  nl LV FxnPost-Intervention Diagram - Vision BMS3.49mx 18 mm    2D Echo 03/2016: Normal LV size and thickness. EF 60-65%. GR 1 DD. Otherwise essentially normal.   Current Meds  Medication Sig  . ACCU-CHEK FASTCLIX LANCETS MISC Check blood sugar 3x a day, diag code E11.3493, insulin dependent  . acetaminophen (TYLENOL) 325 MG tablet Take 650 mg by mouth every 6 (six) hours as needed.  .Marland Kitchenatorvastatin (LIPITOR) 40 MG tablet Take 1 tablet (40 mg total) by mouth daily.  . Blood Glucose Monitoring Suppl (ACCU-CHEK GUIDE) w/Device KIT 1 each by Does not apply route 3 (three) times daily.  . carvedilol (COREG) 25 MG tablet Take 1 tablet (25 mg total) by mouth 2 (two) times daily with a meal.  . clopidogrel (PLAVIX) 75 MG tablet TAKE 1 TABLET BY MOUTH DAILY.  .Marland Kitchendiltiazem (CARDIZEM CD) 240 MG 24 hr capsule Take 1 capsule (240 mg total) by mouth daily.  . ferrous sulfate 325 (65 FE) MG tablet TAKE 1 Tablet BY MOUTH EVERY MORNING WITH BREAKFAST  . furosemide (LASIX)  40 MG tablet Take 1 tablet (40 mg total) by mouth daily.  Marland Kitchen glucose blood (ACCU-CHEK GUIDE) test strip Check blood sugar 3x a day  as instructed  . insulin NPH-regular Human (HUMULIN 70/30) (70-30) 100 UNIT/ML injection Inject 16 Units into the skin 2 (two) times daily with a meal.  . Insulin Pen Needle (B-D UF III MINI PEN NEEDLES) 31G X 5 MM MISC Use for injection of [place] as instructed.  . Insulin Syringe-Needle U-100 31G X 15/64" 0.3 ML MISC Use to inject insulin two times a day  . liraglutide (VICTOZA) 18 MG/3ML SOPN Inject 0.2 mLs (1.2 mg total) into the skin daily.  . megestrol (MEGACE) 40 MG tablet Take 1 tablet (40 mg total) by mouth daily. Internal Medicine Program  . PROVENTIL HFA 108 (90 Base) MCG/ACT inhaler INHALE 2 PUFFS BY MOUTH EVERY 4 HOURS AS NEEDED FOR COUGHING, WHEEZING, OR SHORTNESS OF BREATH  . [DISCONTINUED] aspirin EC 81 MG tablet Take 1 tablet (81 mg total) by mouth 2 (two) times daily.    Allergies    Allergen Reactions  . Adhesive [Tape] Other (See Comments)    Irritation     Social History   Social History  . Marital status: Single    Spouse name: N/A  . Number of children: 2  . Years of education: 13   Occupational History  .   [employer]   Social History Main Topics  . Smoking status: Former Smoker    Packs/day: 0.00    Years: 0.00    Quit date: 08/29/2015  . Smokeless tobacco: Never Used  . Alcohol use No  . Drug use: No  . Sexual activity: Yes    Birth control/ protection: Surgical   Other Topics Concern  . None   Social History Narrative   Patient does not drink caffeine.   Patient is right handed.     family history includes Aneurysm in her mother; Cancer in her paternal grandfather; Diabetes in her father, maternal grandfather, maternal grandmother, mother, and sister; Hypertension in her father, mother, and sister; Stroke in her mother.  Wt Readings from Last 3 Encounters:  07/04/17 226 lb (102.5 kg)  06/28/17 231 lb 11.2 oz (105.1 kg)  06/27/17 230 lb 6.4 oz (104.5 kg)    PHYSICAL EXAM BP (!) 144/88   Pulse 96   Ht '5\' 5"'$  (1.651 m)   Wt 226 lb (102.5 kg)   LMP  (LMP Unknown)   BMI 37.61 kg/m   - BP on recheck 134/80 mmHg (she noted foot pain walking in) Physical Exam  Constitutional: She is oriented to person, place, and time. She appears well-developed and well-nourished. No distress.  Moderately obese; in good spirits (smiling) - not anxious  HENT:  Head: Normocephalic and atraumatic.  Eyes: Pupils are equal, round, and reactive to light. EOM are normal.  Neck: Normal range of motion. No hepatojugular reflux and no JVD present. Carotid bruit is not present.  Cardiovascular: Normal rate, regular rhythm, normal heart sounds and intact distal pulses.  Exam reveals no gallop and no friction rub.   No murmur heard. Pulmonary/Chest: Effort normal and breath sounds normal. No respiratory distress. She has no wheezes. She has no  rales. She exhibits no tenderness.  Abdominal: Soft. Bowel sounds are normal. She exhibits no distension. There is no tenderness. There is no rebound.  Musculoskeletal: She exhibits deformity (Left foot is in a black plastic boot cast.). She exhibits no edema.  Neurological: She is alert  and oriented to person, place, and time.  Skin: Skin is warm and dry. No rash noted. No erythema.  Psychiatric: She has a normal mood and affect. Her behavior is normal. Judgment and thought content normal.  Nursing note and vitals reviewed.    Adult ECG Report n/a  Other studies Reviewed: Additional studies/ records that were reviewed today include:  Recent Labs:    Lab Results  Component Value Date   CHOL 125 03/03/2017   HDL 41 (L) 03/03/2017   LDLCALC 65 03/03/2017   TRIG 93 03/03/2017   CHOLHDL 3.0 03/03/2017    ASSESSMENT / PLAN: Problem List Items Addressed This Visit    Acute renal failure superimposed on stage 3 chronic kidney disease (HCC)   CAD S/P BMS PCI to prox LAD - Primary (Chronic)    Single-vessel disease with a large stent in the LAD. Now on Plavix. No further bleeding issues. I think we can stop aspirin especially in light of her renal insufficiency. She is on max dose beta blocker as well as diltiazem with better heart rate control. No longer on ACE inhibitor because of her renal insufficiency.  She still has off and on chest discomfort but it is not exertional, and not like what she had when she had her MI. She remains on statin. Still working on glycemic control.      Relevant Orders   Lipid panel   Essential hypertension (Chronic)    Blood pressure is a little bit high today, but on my recheck was notably improved. She is now on higher dose of diltiazem with better controlled heart rate albeit still in the 90s. We can still titrate that up further if necessary. Since she tells me her blood pressures been better controlled at her PCPs office, I'm inclined to leave things  alone. She is on Lasix which may or may not be adjusted by nephrology.      Hyperlipidemia associated with type 2 diabetes mellitus ([place]) (Chronic)    Lipids look pretty good back in April. She remains on moderate dose atorvastatin and is due for follow-up labs before I see her back in 6 months. Tolerating atorvastatin without any myalgias.      Relevant Orders   Lipid panel   Hepatic function panel   Moderate obesity (Chronic)    She is gradually losing weight - down 4 pounds from her last visit. Talked with the importance of dietary modification and hopefully she can start getting into some exercise if her foot with what her. I think a lot of her exertional dyspnea is related to deconditioning.      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 Would probably need to follow-up when I see her in 6 months      Presence of bare metal stent in LAD coronary artery (Chronic)   Sinus tachycardia    Heart rate is better today. I think we still have room to titrate up diltiazem, but will wait to see what her nephrology evaluation reveals.       Other Visit Diagnoses    Medication management       Relevant Orders   Lipid panel   Hepatic function panel      Current medicines are reviewed at length with the patient today. (+/- concerns) n/a The following changes have been made: n/a  Patient Instructions  MEDICATION INSTRUCTION  STOP TAKING ASPIRIN NO OTHER CHANGES WITH MEDICATION  LABS-- LIVER PROFILE , LIPIDS------- IN 6 MONTH  WILL MAIL YOU LABSLIP. PLEASE HAVE LABS COMPLETED PRIOR TO APPOINTMENT. DO NOT EAT OR DRINK THE MORNING OF THE TEST MAY COMEBACK TO OFFICE WITHOUT APPOINTMENT TO HAVE LABS DONE.    Your physician wants you to follow-up in [place].You will receive a reminder letter in the mail two months in advance. If you don't receive a letter, please call our office to schedule  the follow-up appointment.   If you need a refill on your cardiac medications before your next appointment, please call your pharmacy.    Studies Ordered:   Orders Placed This Encounter  Procedures  . Lipid panel  . Hepatic function panel      Glenetta Hew, M.D., M.S. Interventional Cardiologist   Pager # [phone] Phone # [phone] [address] [place], [place] [address]

## 2017-07-04 NOTE — Patient Instructions (Signed)
MEDICATION INSTRUCTION  STOP TAKING ASPIRIN NO OTHER CHANGES WITH MEDICATION    LABS-- LIVER PROFILE , LIPIDS------- IN 6 MONTH  WILL MAIL YOU LABSLIP. PLEASE HAVE LABS COMPLETED PRIOR TO APPOINTMENT. DO NOT EAT OR DRINK THE MORNING OF THE TEST MAY COMEBACK TO OFFICE WITHOUT APPOINTMENT TO HAVE LABS DONE.    Your physician wants you to follow-up in [place].You will receive a reminder letter in the mail two months in advance. If you don't receive a letter, please call our office to schedule the follow-up appointment.   If you need a refill on your cardiac medications before your next appointment, please call your pharmacy.

## 2017-07-06 ENCOUNTER — Encounter: Payer: Self-pay | Admitting: Cardiology

## 2017-07-06 NOTE — Assessment & Plan Note (Signed)
Does not seem to be followed by vascular surgery. Would probably need to follow-up when I see her in 6 months

## 2017-07-06 NOTE — Assessment & Plan Note (Signed)
Single-vessel disease with a large stent in the LAD. Now on Plavix. No further bleeding issues. I think we can stop aspirin especially in light of her renal insufficiency. She is on max dose beta blocker as well as diltiazem with better heart rate control. No longer on ACE inhibitor because of her renal insufficiency.  She still has off and on chest discomfort but it is not exertional, and not like what she had when she had her MI. She remains on statin. Still working on glycemic control.

## 2017-07-06 NOTE — Assessment & Plan Note (Signed)
Lipids look pretty good back in April. She remains on moderate dose atorvastatin and is due for follow-up labs before I see her back in 6 months. Tolerating atorvastatin without any myalgias.

## 2017-07-06 NOTE — Assessment & Plan Note (Signed)
Heart rate is better today. I think we still have room to titrate up diltiazem, but will wait to see what her nephrology evaluation reveals.

## 2017-07-06 NOTE — Assessment & Plan Note (Signed)
Blood pressure is a little bit high today, but on my recheck was notably improved. She is now on higher dose of diltiazem with better controlled heart rate albeit still in the 90s. We can still titrate that up further if necessary. Since she tells me her blood pressures been better controlled at her PCPs office, I'm inclined to leave things alone. She is on Lasix which may or may not be adjusted by nephrology.

## 2017-07-06 NOTE — Assessment & Plan Note (Signed)
She is gradually losing weight - down 4 pounds from her last visit. Talked with the importance of dietary modification and hopefully she can start getting into some exercise if her foot with what her. I think a lot of her exertional dyspnea is related to deconditioning.

## 2017-07-07 ENCOUNTER — Other Ambulatory Visit (HOSPITAL_COMMUNITY): Payer: Self-pay | Admitting: Nephrology

## 2017-07-07 DIAGNOSIS — N19 Unspecified kidney failure: Secondary | ICD-10-CM

## 2017-07-07 DIAGNOSIS — N019 Rapidly progressive nephritic syndrome with unspecified morphologic changes: Secondary | ICD-10-CM

## 2017-07-10 ENCOUNTER — Ambulatory Visit (INDEPENDENT_AMBULATORY_CARE_PROVIDER_SITE_OTHER): Payer: Medicaid Other | Admitting: Internal Medicine

## 2017-07-10 ENCOUNTER — Encounter: Payer: Self-pay | Admitting: Internal Medicine

## 2017-07-10 VITALS — BP 121/66 | HR 99 | Temp 99.0°F | Ht 65.0 in | Wt 228.1 lb

## 2017-07-10 DIAGNOSIS — R319 Hematuria, unspecified: Principal | ICD-10-CM

## 2017-07-10 DIAGNOSIS — N390 Urinary tract infection, site not specified: Secondary | ICD-10-CM

## 2017-07-10 DIAGNOSIS — N3001 Acute cystitis with hematuria: Secondary | ICD-10-CM | POA: Diagnosis present

## 2017-07-10 DIAGNOSIS — Z87891 Personal history of nicotine dependence: Secondary | ICD-10-CM

## 2017-07-10 DIAGNOSIS — Z79899 Other long term (current) drug therapy: Secondary | ICD-10-CM

## 2017-07-10 MED ORDER — ONDANSETRON HCL 4 MG PO TABS: 4.0000 mg | ORAL_TABLET | Freq: Three times a day (TID) | ORAL | 0 refills | Status: DC | PRN

## 2017-07-10 MED ORDER — SODIUM CHLORIDE 0.9 % IV BOLUS (SEPSIS): 500.0000 mL | Freq: Once | INTRAVENOUS | Status: DC

## 2017-07-10 MED FILL — CIPROFLOXACIN HCL 250 MG TA: 250 | 7 days supply | Qty: 14 | Fill #0

## 2017-07-10 MED FILL — ONDANSETRON HCL 4 MG TABLET: 4 | 5 days supply | Qty: 15 | Fill #0

## 2017-07-10 NOTE — Patient Instructions (Signed)
Jane [name] it was nice meeting you today.  -Take Cipro 250 mg twice daily for a total of 7 days  -You may take Zofran 4 mg every 8 hours as needed for nausea  -DO NOT take Lasix for the next 3 days. If after 3 days you start feeling better and are not dizzy anymore, you can start taking Lasix again.   -Make sure you're drinking fluids and stay hydrated  -Please go to Dr. Bishop Dublin office on Thursday for your kidney biopsy  -Please return to our clinic in 1 week

## 2017-07-10 NOTE — Progress Notes (Signed)
   CC: Flank pain  HPI:  [name] is a 41 y.o. female with a past medical history of conditions listed below presenting to the clinic complaining of left-sided flank pain. Please see problem based charting for the status of the patient's current and chronic medical conditions.   Past Medical History:  Diagnosis Date  . Asthma    prn inhaler  . CAD S/P BMS PCI to prox LAD 03/31/2016   Ost LAD to Mid LAD lesion, 90% stenosed. Post intervention - Vision BMS 3.0 mm x 18 mm (~3.5 mm) there is a 0% residual stenosis.   . Charcot foot due to diabetes mellitus ([place]) 11/2016   left  . Depression   . Diabetic neuropathy (HCC)    feet  . H/O non-ST elevation myocardial infarction (NSTEMI) 03/2016   Found in 9% mid LAD lesion treated with bare-metal stent (BMS) PCI - vision BMS 3.0 mm x 18 mm  . History of MRSA infection   . Hyperlipidemia   . Hypertension    medication dose increased 11/22/2015; has been taking med. consistently since 03/2016, per pt.  . Insulin dependent diabetes mellitus ([place])   . Iron deficiency anemia    takes iron supplement  . Retinopathy of both eyes   . Sickle cell trait ([place])   . 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 Remainder of all ROS negative.   Physical Exam:  There were no vitals filed for this visit. Physical Exam  Constitutional: She is oriented to person, place, and time. She appears well-developed and well-nourished. No distress.  HENT:  Mouth/Throat: Oropharynx is clear and moist.  Eyes: Right eye exhibits no discharge. Left eye exhibits no discharge.  Cardiovascular: Normal rate, regular rhythm and intact distal pulses.   Pulmonary/Chest: Effort normal and breath sounds normal. No respiratory distress. She has no wheezes. She has no rales.  Abdominal: Soft. Bowel sounds are normal. She exhibits no distension. There is no tenderness.  Genitourinary:  Genitourinary Comments:  No CVA tenderness bilaterally  Musculoskeletal:  Wearing a boot on her left lower extremity (for charcot foot per patient)  Neurological: She is alert and oriented to person, place, and time.  Skin: Skin is warm and dry.    Assessment & Plan:   See Encounters Tab for problem based charting.  Patient discussed with Dr. Angelia Mould

## 2017-07-11 ENCOUNTER — Other Ambulatory Visit: Payer: Self-pay | Admitting: Student

## 2017-07-11 ENCOUNTER — Ambulatory Visit
Admission: RE | Admit: 2017-07-11 | Discharge: 2017-07-11 | Disposition: A | Discharge status: Home / Self Care | Payer: Medicaid Other | Source: Ambulatory Visit | Attending: Obstetrics & Gynecology | Admitting: Obstetrics & Gynecology

## 2017-07-11 DIAGNOSIS — Z1239 Encounter for other screening for malignant neoplasm of breast: Secondary | ICD-10-CM

## 2017-07-11 NOTE — Assessment & Plan Note (Signed)
History of present illness Patient is complaining of a 5 day history of left-sided flank pain. States she went to her nephrologist's office 3 days ago and they just called her back informing her that she has a UTI. States her nephrologist has prescribed her an antibiotic but she has not picked it up yet. States she is scheduled for a kidney biopsy on Thursday this week. Denies having any dysuria or hematuria. States she noticed some blood on the tissue when wiping this morning but it was from her vagina. Patient has a history of dysfunctional uterine bleeding for which she is currently on Megace. She reports having chronic nausea and chronic dizziness. Denies having any episodes of vomiting. She is not sure if she's having any fevers but does report having chronic chills which he attributes to her anemia. I spoke to [facility] (Dr. Bishop Dublin nurse) and he informed me that labs done 3 days days ago did indeed show a UTI. They have prescribed her ciprofloxacin 250 mg twice daily for a 7 day course and she is scheduled for a kidney biopsy this Thursday.  Assessment Acute cystitis. Pyelonephritis less likely as patient had no CVA tenderness on exam and is afebrile. Her nausea is a chronic problem per patient. Orthostatic vitals checked at this visit were positive but this seems to be a chronic problem. Per chart review, during her admission in May 2017 patient continued to be orthostatic despite aggressive IV fluid hydration.  Plan -Offered her a 0.5 L normal saline bolus in the clinic for hydration which she agreed to initially but then later refused. Encouraged oral hydration. -Advised her to hold Lasix for the next 3 days and resume afterwards only if she is not having any symptoms of orthostasis.  -Advised patient to pick up Cipro from the pharmacy and finish a full seven-day course -Zofran as needed for nausea -Encouraged her to go for her kidney biopsy -Return to the clinic on 07/18/2017 for a follow-up.  Urine studies will be collected at that visit to ensure resolution of her UTI.

## 2017-07-11 NOTE — Progress Notes (Signed)
Internal Medicine Clinic Attending  Case discussed with Dr. Rathoreat the time of the visit. We reviewed the resident's history and exam and pertinent patient test results. I agree with the assessment, diagnosis, and plan of care documented in the resident's note.  

## 2017-07-13 ENCOUNTER — Ambulatory Visit (HOSPITAL_COMMUNITY)
Admission: RE | Admit: 2017-07-13 | Discharge: 2017-07-13 | Disposition: A | Discharge status: Home / Self Care | Payer: Medicaid Other | Source: Ambulatory Visit | Attending: Nephrology | Admitting: Nephrology

## 2017-07-15 DIAGNOSIS — Z9889 Other specified postprocedural states: Secondary | ICD-10-CM

## 2017-07-15 HISTORY — DX: Other specified postprocedural states: Z98.890

## 2017-07-18 ENCOUNTER — Encounter: Payer: Self-pay | Admitting: Internal Medicine

## 2017-07-18 ENCOUNTER — Ambulatory Visit (INDEPENDENT_AMBULATORY_CARE_PROVIDER_SITE_OTHER): Payer: Medicaid Other | Admitting: Internal Medicine

## 2017-07-18 ENCOUNTER — Other Ambulatory Visit: Payer: Self-pay | Admitting: Radiology

## 2017-07-18 VITALS — BP 138/79 | HR 102 | Temp 99.2°F | Ht 65.0 in | Wt 233.0 lb

## 2017-07-18 DIAGNOSIS — Z87891 Personal history of nicotine dependence: Secondary | ICD-10-CM | POA: Diagnosis not present

## 2017-07-18 DIAGNOSIS — N3001 Acute cystitis with hematuria: Secondary | ICD-10-CM

## 2017-07-18 LAB — POCT URINALYSIS DIPSTICK
BILIRUBIN UA: NEGATIVE
GLUCOSE UA: NEGATIVE
Ketones, UA: NEGATIVE
Leukocytes, UA: NEGATIVE
NITRITE UA: NEGATIVE
Protein, UA: >=300
Spec Grav, UA: 1.025 (ref 1.010–1.025)
Urobilinogen, UA: 0.2 E.U./dL
pH, UA: 5.5 (ref 5.0–8.0)

## 2017-07-18 NOTE — Assessment & Plan Note (Signed)
Patient was seen on 8/28. She had gone to the nephrologist 3 days prior who had told her that she has a UTI and was prescribed a 7 day course of ciprofloxacin, which the patient says that she finished. She denies any dysuria, hematuria or increased urinary frequency. Patient has history of rapidly worsening renal function with nephrotic range proteinuira, and is currently a CKD stage 5- for which she was referred to renal. She was started on lasix recently which she reports taking it. She says that she has been urinating less than usual  And has chronic nausea and dizziness. For the rapidly worsening renal function, she was scheduled for renal biopsy last Thursday which was postponed due to her UTI with plan to do the biopsy tomorrow. They wanted docuemntation of clearance of UTI before proceeding with the biopsy.  A dipstick UA today is negative for nitrites and leukocytes but does show protein and moderate blood, which is essentially unchanged from prior results.   Assessment: resolution of her acute cystitis s/p completion of the antibiotic course  Plan -follow up with Renal for her kidney biopsy-I provided her with the Dipstick results which she can show to the pre-op prior to the biopsy per patient's request.  -follow up with Nephrology- I explained that she will need close follow up with nephrology with the impending dialysis- and patient understood the timeframe.

## 2017-07-18 NOTE — Patient Instructions (Signed)
Thank you for your visit today  Your urinalysis shows that your infection has resolved- Please follow up for your kidney biopsy tomorrow.  Please follow up with your kidney doctor

## 2017-07-18 NOTE — Progress Notes (Signed)
Case discussed with Dr. Tiburcio Pea at the time of the visit.  We reviewed the resident's history and exam and pertinent patient test results.  I agree with the assessment, diagnosis and plan of care documented in the resident's note.

## 2017-07-18 NOTE — Progress Notes (Signed)
    CC: UTI HPI: [name] is a 41 y.o. woman with PMH noted below here for follow up of her UTI  Please see Problem List/A&P for the status of the patient's chronic medical problems   Past Medical History:  Diagnosis Date  . Asthma    prn inhaler  . CAD S/P BMS PCI to prox LAD 03/31/2016   Ost LAD to Mid LAD lesion, 90% stenosed. Post intervention - Vision BMS 3.0 mm x 18 mm (~3.5 mm) there is a 0% residual stenosis.   . Charcot foot due to diabetes mellitus ([place]) 11/2016   left  . Depression   . Diabetic neuropathy (HCC)    feet  . H/O non-ST elevation myocardial infarction (NSTEMI) 03/2016   Found in 9% mid LAD lesion treated with bare-metal stent (BMS) PCI - vision BMS 3.0 mm x 18 mm  . History of MRSA infection   . Hyperlipidemia   . Hypertension    medication dose increased 11/22/2015; has been taking med. consistently since 03/2016, per pt.  . Insulin dependent diabetes mellitus ([place])   . Iron deficiency anemia    takes iron supplement  . Retinopathy of both eyes   . Sickle cell trait ([place])   . 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  Gastrointestinal: Negative for heartburn, nausea, vomiting, abdominal pain, diarrhea  GU: negative for dysuria or hematuria (but did notice one time blood on toilet paper last Monday). No urinary frequency or urgency.  Physical Exam: Vitals:   07/18/17 1029  BP: 138/79  Pulse: (!) 102  Temp: 99.2 F (37.3 C)  TempSrc: Oral  SpO2: 100%  Weight: 233 lb (105.7 kg)  Height: 5\' 5"  (1.651 m)    General: A&O, in NAD, obese CV: RRR, normal s1, s2, no m/r/g, Resp: equal and symmetric breath sounds, no wheezing or crackles  Abdomen: soft, nontender, nondistended, +BS, mild left cva tenderness   Assessment & Plan:   See encounters tab for problem based medical decision  making. Patient discussed with Dr. Eppie Gibson

## 2017-07-19 ENCOUNTER — Ambulatory Visit (HOSPITAL_COMMUNITY)
Admission: RE | Admit: 2017-07-19 | Discharge: 2017-07-19 | Disposition: A | Discharge status: Home / Self Care | Payer: Medicaid Other | Source: Ambulatory Visit | Attending: Nephrology | Admitting: Nephrology

## 2017-07-19 DIAGNOSIS — N19 Unspecified kidney failure: Secondary | ICD-10-CM | POA: Insufficient documentation

## 2017-07-19 DIAGNOSIS — N019 Rapidly progressive nephritic syndrome with unspecified morphologic changes: Secondary | ICD-10-CM | POA: Diagnosis not present

## 2017-07-19 LAB — GLUCOSE, CAPILLARY: Glucose-Capillary: 109 mg/dL — ABNORMAL HIGH (ref 65–99)

## 2017-07-19 MED ORDER — SODIUM CHLORIDE 0.9 % IV SOLN: INTRAVENOUS | Status: DC

## 2017-07-19 NOTE — Progress Notes (Signed)
Patient presented to radiology department for renal biopsy.  She is diabetic and had a hypoglycemic event this AM.  She called up to the radiology department and was told she needed to treat her low if symptomatic. She drank >1 cup cranberry juice to treat.  Patient also does not have transportation home today, but does live at home with her sons age 42 and 98.  Discussed risk of sedation with large volume intake and need for monitoring once she leaves the hospital.  Patient will reschedule biopsy.  Note this has also been rescheduled from last week due to a UTI.  Patient states she needs to have procedure.  Discussed ways to prevent hypoglycemia for next appointment and encouraged patient to have family with her at discharge from short stay. She verbalizes understanding.   Brynda Greathouse, MS RD PA-C 7:48 AM

## 2017-07-19 NOTE — Progress Notes (Signed)
Pt here for renal biopsy. Pt states her sugar this morning was 60, she called ultrasound and was told by tech to drink juice. Pt states she drank about 1 cup or more of cranberry juice around 6am. Also pt here by bus and has no ride back home. States she can go back by bus or call a taxi. Sherlie Ban, PA at bedside. Procedure to be reschedule due to pt not NPO and no ride home. Pt understands and agrees with plan to be rescheduled. Pt taken back to bus station by wheelchair. Soda and crackers given. Awake and alert. In no distress

## 2017-07-20 ENCOUNTER — Other Ambulatory Visit: Payer: Self-pay

## 2017-07-20 DIAGNOSIS — Z01810 Encounter for preprocedural cardiovascular examination: Secondary | ICD-10-CM

## 2017-07-20 DIAGNOSIS — N185 Chronic kidney disease, stage 5: Secondary | ICD-10-CM

## 2017-07-21 ENCOUNTER — Other Ambulatory Visit (HOSPITAL_COMMUNITY): Payer: Self-pay | Admitting: Nephrology

## 2017-07-21 DIAGNOSIS — N19 Unspecified kidney failure: Secondary | ICD-10-CM

## 2017-07-21 DIAGNOSIS — N019 Rapidly progressive nephritic syndrome with unspecified morphologic changes: Secondary | ICD-10-CM

## 2017-07-24 ENCOUNTER — Inpatient Hospital Stay (HOSPITAL_COMMUNITY)
Admission: EM | Admit: 2017-07-24 | Discharge: 2017-08-01 | DRG: 673 | Disposition: A | Discharge status: Home / Self Care | Payer: Medicaid Other | Attending: Internal Medicine | Admitting: Internal Medicine

## 2017-07-24 ENCOUNTER — Encounter (HOSPITAL_COMMUNITY): Payer: Self-pay | Admitting: *Deleted

## 2017-07-24 ENCOUNTER — Emergency Department (HOSPITAL_COMMUNITY): Payer: Medicaid Other

## 2017-07-24 DIAGNOSIS — I2510 Atherosclerotic heart disease of native coronary artery without angina pectoris: Secondary | ICD-10-CM | POA: Diagnosis present

## 2017-07-24 DIAGNOSIS — J45909 Unspecified asthma, uncomplicated: Secondary | ICD-10-CM | POA: Diagnosis present

## 2017-07-24 DIAGNOSIS — Z79899 Other long term (current) drug therapy: Secondary | ICD-10-CM

## 2017-07-24 DIAGNOSIS — E11319 Type 2 diabetes mellitus with unspecified diabetic retinopathy without macular edema: Secondary | ICD-10-CM | POA: Diagnosis not present

## 2017-07-24 DIAGNOSIS — E11649 Type 2 diabetes mellitus with hypoglycemia without coma: Secondary | ICD-10-CM | POA: Diagnosis not present

## 2017-07-24 DIAGNOSIS — E1122 Type 2 diabetes mellitus with diabetic chronic kidney disease: Secondary | ICD-10-CM | POA: Diagnosis present

## 2017-07-24 DIAGNOSIS — Z8614 Personal history of Methicillin resistant Staphylococcus aureus infection: Secondary | ICD-10-CM

## 2017-07-24 DIAGNOSIS — Z79818 Long term (current) use of other agents affecting estrogen receptors and estrogen levels: Secondary | ICD-10-CM

## 2017-07-24 DIAGNOSIS — Z833 Family history of diabetes mellitus: Secondary | ICD-10-CM

## 2017-07-24 DIAGNOSIS — I129 Hypertensive chronic kidney disease with stage 1 through stage 4 chronic kidney disease, or unspecified chronic kidney disease: Secondary | ICD-10-CM | POA: Diagnosis not present

## 2017-07-24 DIAGNOSIS — I252 Old myocardial infarction: Secondary | ICD-10-CM

## 2017-07-24 DIAGNOSIS — D509 Iron deficiency anemia, unspecified: Secondary | ICD-10-CM

## 2017-07-24 DIAGNOSIS — Z809 Family history of malignant neoplasm, unspecified: Secondary | ICD-10-CM

## 2017-07-24 DIAGNOSIS — E1169 Type 2 diabetes mellitus with other specified complication: Secondary | ICD-10-CM | POA: Diagnosis present

## 2017-07-24 DIAGNOSIS — K921 Melena: Secondary | ICD-10-CM

## 2017-07-24 DIAGNOSIS — Z794 Long term (current) use of insulin: Secondary | ICD-10-CM

## 2017-07-24 DIAGNOSIS — Z955 Presence of coronary angioplasty implant and graft: Secondary | ICD-10-CM

## 2017-07-24 DIAGNOSIS — K5900 Constipation, unspecified: Secondary | ICD-10-CM

## 2017-07-24 DIAGNOSIS — Z87891 Personal history of nicotine dependence: Secondary | ICD-10-CM | POA: Diagnosis not present

## 2017-07-24 DIAGNOSIS — Z87440 Personal history of urinary (tract) infections: Secondary | ICD-10-CM

## 2017-07-24 DIAGNOSIS — Z6838 Body mass index (BMI) 38.0-38.9, adult: Secondary | ICD-10-CM

## 2017-07-24 DIAGNOSIS — E669 Obesity, unspecified: Secondary | ICD-10-CM | POA: Diagnosis present

## 2017-07-24 DIAGNOSIS — Z8249 Family history of ischemic heart disease and other diseases of the circulatory system: Secondary | ICD-10-CM

## 2017-07-24 DIAGNOSIS — N179 Acute kidney failure, unspecified: Secondary | ICD-10-CM

## 2017-07-24 DIAGNOSIS — Z9861 Coronary angioplasty status: Secondary | ICD-10-CM

## 2017-07-24 DIAGNOSIS — E1140 Type 2 diabetes mellitus with diabetic neuropathy, unspecified: Secondary | ICD-10-CM | POA: Diagnosis present

## 2017-07-24 DIAGNOSIS — N186 End stage renal disease: Secondary | ICD-10-CM | POA: Diagnosis present

## 2017-07-24 DIAGNOSIS — I120 Hypertensive chronic kidney disease with stage 5 chronic kidney disease or end stage renal disease: Principal | ICD-10-CM | POA: Diagnosis present

## 2017-07-24 DIAGNOSIS — M14672 Charcot's joint, left ankle and foot: Secondary | ICD-10-CM | POA: Diagnosis present

## 2017-07-24 DIAGNOSIS — E11610 Type 2 diabetes mellitus with diabetic neuropathic arthropathy: Secondary | ICD-10-CM | POA: Diagnosis present

## 2017-07-24 DIAGNOSIS — N185 Chronic kidney disease, stage 5: Secondary | ICD-10-CM | POA: Diagnosis not present

## 2017-07-24 DIAGNOSIS — Z7902 Long term (current) use of antithrombotics/antiplatelets: Secondary | ICD-10-CM

## 2017-07-24 DIAGNOSIS — I10 Essential (primary) hypertension: Secondary | ICD-10-CM | POA: Diagnosis present

## 2017-07-24 DIAGNOSIS — D573 Sickle-cell trait: Secondary | ICD-10-CM | POA: Diagnosis present

## 2017-07-24 DIAGNOSIS — E785 Hyperlipidemia, unspecified: Secondary | ICD-10-CM | POA: Diagnosis present

## 2017-07-24 DIAGNOSIS — D649 Anemia, unspecified: Secondary | ICD-10-CM | POA: Diagnosis present

## 2017-07-24 DIAGNOSIS — D631 Anemia in chronic kidney disease: Secondary | ICD-10-CM | POA: Diagnosis present

## 2017-07-24 DIAGNOSIS — E113493 Type 2 diabetes mellitus with severe nonproliferative diabetic retinopathy without macular edema, bilateral: Secondary | ICD-10-CM | POA: Diagnosis present

## 2017-07-24 DIAGNOSIS — F329 Major depressive disorder, single episode, unspecified: Secondary | ICD-10-CM | POA: Diagnosis present

## 2017-07-24 LAB — URINALYSIS, ROUTINE W REFLEX MICROSCOPIC
Bacteria, UA: NONE SEEN
Bilirubin Urine: NEGATIVE
GLUCOSE, UA: 50 mg/dL — AB
Ketones, ur: NEGATIVE mg/dL
Leukocytes, UA: NEGATIVE
Nitrite: NEGATIVE
PH: 6 (ref 5.0–8.0)
Protein, ur: >=300 mg/dL — AB
Specific Gravity, Urine: 1.01 (ref 1.005–1.030)

## 2017-07-24 LAB — COMPREHENSIVE METABOLIC PANEL
ALK PHOS: 78 U/L (ref 38–126)
ALT: 18 U/L (ref 14–54)
AST: 22 U/L (ref 15–41)
Albumin: 3 g/dL — ABNORMAL LOW (ref 3.5–5.0)
Anion gap: 7 (ref 5–15)
BUN: 38 mg/dL — AB (ref 6–20)
CALCIUM: 8.5 mg/dL — AB (ref 8.9–10.3)
CHLORIDE: 108 mmol/L (ref 101–111)
CO2: 23 mmol/L (ref 22–32)
CREATININE: 4.63 mg/dL — AB (ref 0.44–1.00)
GFR, EST AFRICAN AMERICAN: 12 mL/min — AB (ref >60)
GFR, EST NON AFRICAN AMERICAN: 11 mL/min — AB (ref >60)
Glucose, Bld: 132 mg/dL — ABNORMAL HIGH (ref 65–99)
Potassium: 4.4 mmol/L (ref 3.5–5.1)
SODIUM: 138 mmol/L (ref 135–145)
Total Bilirubin: 0.4 mg/dL (ref 0.3–1.2)
Total Protein: 5.9 g/dL — ABNORMAL LOW (ref 6.5–8.1)

## 2017-07-24 LAB — CBC
HCT: 24.4 % — ABNORMAL LOW (ref 36.0–46.0)
Hemoglobin: 7.5 g/dL — ABNORMAL LOW (ref 12.0–15.0)
MCH: 25.7 pg — ABNORMAL LOW (ref 26.0–34.0)
MCHC: 30.7 g/dL (ref 30.0–36.0)
MCV: 83.6 fL (ref 78.0–100.0)
PLATELETS: 233 10*3/uL (ref 150–400)
RBC: 2.92 MIL/uL — AB (ref 3.87–5.11)
RDW: 13.9 % (ref 11.5–15.5)
WBC: 6.4 10*3/uL (ref 4.0–10.5)

## 2017-07-24 LAB — CBG MONITORING, ED
GLUCOSE-CAPILLARY: 77 mg/dL (ref 65–99)
Glucose-Capillary: 44 mg/dL — CL (ref 65–99)

## 2017-07-24 LAB — LIPASE, BLOOD: LIPASE: 34 U/L (ref 11–51)

## 2017-07-24 LAB — POC URINE PREG, ED: Preg Test, Ur: NEGATIVE

## 2017-07-24 MED ORDER — INSULIN ASPART 100 UNIT/ML ~~LOC~~ SOLN
3.0000 [IU] | Freq: Three times a day (TID) | SUBCUTANEOUS | Status: DC
  Administered 2017-07-25 – 2017-08-01 (×19): 3 [IU] via SUBCUTANEOUS

## 2017-07-24 MED ORDER — SODIUM CHLORIDE 0.9 % IV SOLN
Freq: Once | INTRAVENOUS | Status: AC
  Administered 2017-07-25: 01:00:00 via INTRAVENOUS

## 2017-07-24 MED ORDER — INSULIN ASPART 100 UNIT/ML ~~LOC~~ SOLN
0.0000 [IU] | Freq: Three times a day (TID) | SUBCUTANEOUS | Status: DC
  Administered 2017-07-25: 2 [IU] via SUBCUTANEOUS
  Administered 2017-07-25: 1 [IU] via SUBCUTANEOUS
  Administered 2017-07-26: 3 [IU] via SUBCUTANEOUS
  Administered 2017-07-27: 1 [IU] via SUBCUTANEOUS
  Administered 2017-07-27 – 2017-07-28 (×3): 2 [IU] via SUBCUTANEOUS
  Administered 2017-07-28: 3 [IU] via SUBCUTANEOUS
  Administered 2017-07-29: 1 [IU] via SUBCUTANEOUS
  Administered 2017-07-29: 3 [IU] via SUBCUTANEOUS
  Administered 2017-07-29: 1 [IU] via SUBCUTANEOUS
  Administered 2017-07-30 – 2017-08-01 (×5): 2 [IU] via SUBCUTANEOUS

## 2017-07-24 MED ORDER — INSULIN GLARGINE 100 UNIT/ML ~~LOC~~ SOLN
10.0000 [IU] | Freq: Every day | SUBCUTANEOUS | Status: DC
  Filled 2017-07-24: qty 0.1

## 2017-07-24 NOTE — ED Notes (Signed)
Delay in lab draw, pt not in room 

## 2017-07-24 NOTE — ED Provider Notes (Signed)
[facility] DEPT Provider Note   CSN: [id number] Arrival date & time: 07/24/17  1219     History   Chief Complaint No chief complaint on file.   HPI Jane [name] is a 41 y.o. female.  HPI Patient referred to the emergency department by her nephrologist. Patient has been developing kidney failure but is not yet on dialysis. Patient was identified to have anemia and referred for blood transfusion. Patient reports she has been feeling weak and fatigued. She reports she's been trying to take in fluids over the weekend but has continued to have decreased urine output. She was scheduled to get a kidney biopsy but twice it had to be delayed once that she was identified to have a UTI and they could not proceed and now her hemoglobin is low and she was referred to the emergency department. Past Medical History:  Diagnosis Date  . Asthma    prn inhaler  . CAD S/P BMS PCI to prox LAD 03/31/2016   Ost LAD to Mid LAD lesion, 90% stenosed. Post intervention - Vision BMS 3.0 mm x 18 mm (~3.5 mm) there is a 0% residual stenosis.   . Charcot foot due to diabetes mellitus ([place]) 11/2016   left  . Depression   . Diabetic neuropathy (HCC)    feet  . H/O non-ST elevation myocardial infarction (NSTEMI) 03/2016   Found in 9% mid LAD lesion treated with bare-metal stent (BMS) PCI - vision BMS 3.0 mm x 18 mm  . History of MRSA infection   . Hyperlipidemia   . Hypertension    medication dose increased 11/22/2015; has been taking med. consistently since 03/2016, per pt.  . Insulin dependent diabetes mellitus ([place])   . Iron deficiency anemia    takes iron supplement  . Retinopathy of both eyes   . Sickle cell trait ([place])   . 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    Patient Active Problem List   Diagnosis Date Noted  . Symptomatic anemia 07/24/2017  . Acute renal failure superimposed on stage 3 chronic kidney disease ([place]) 07/04/2017  . Sinus tachycardia 02/23/2017  . CKD (chronic kidney  disease) stage 4, GFR 15-29 ml/min (HCC) 01/05/2017  . Tight heelcords, acquired, left 11/24/2016  . Vitreous floater, left 11/16/2016  . Preoperative cardiovascular examination 10/17/2016  . Poor social situation 04/19/2016  . Financial difficulties 04/08/2016  . CAD S/P BMS PCI to prox LAD 03/31/2016  . Presence of bare metal stent in LAD coronary artery 03/31/2016  . Atypical angina ([place]) 03/29/2016  . Dysfunctional uterine bleeding 03/17/2016  . PAOD (peripheral arterial occlusive disease) ([place]) 01/08/2016  . Diabetic nephropathy ([place]) 12/22/2015  . Charcot foot due to diabetes mellitus ([place]) 09/07/2015  . Peripheral neuropathy 09/20/2014  . Essential hypertension 08/16/2014  . Seasonal allergic rhinitis 08/16/2014  . Iron deficiency anemia 10/25/2013  . Healthcare maintenance 10/25/2013  . Urinary tract infection with hematuria 10/30/2012  . Moderate obesity 09/26/2012  . Hyperlipidemia associated with type 2 diabetes mellitus ([place]) 02/26/2009  . 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 Asthma 05/08/2007    Past Surgical History:  Procedure Laterality Date  . ACHILLES TENDON LENGTHENING Left 11/24/2016   Procedure: Left Achilles tendon lengthening (open);  Surgeon: Wylene Simmer, MD;  Location: [facility];  Service: Orthopedics;  Laterality: Left;  . CALCANEAL OSTEOTOMY Left 11/24/2016   Procedure: Left hindfoot osteotomy and fusion;  Surgeon: Wylene Simmer, MD;  Location: [facility];  Service: Orthopedics;  Laterality: Left;  . CARDIAC CATHETERIZATION N/A 03/31/2016   Procedure: Left Heart Cath and Coronary Angiography;  Surgeon: Lorretta Harp, MD;  Location: [facility] INVASIVE CV LAB: 90% early mLAD, normal LV Fxn  . CARDIAC CATHETERIZATION N/A 03/31/2016   Procedure: Coronary Stent Intervention;  Surgeon: Lorretta Harp, MD;  Location: [facility] CV LAB;  Service:  Cardiovascular: PCI to mLAD BMS Vision 3.0 mm x 18 mm  . CESAREAN SECTION  1997  . TRANSTHORACIC ECHOCARDIOGRAM  03/2016   Normal LV size and thickness. EF 60-65%. GR 1 DD. Otherwise essentially normal.  . TUBAL LIGATION  1997    OB History    Gravida Para Term Preterm AB Living   _0 0 2   SAB TAB Ectopic Multiple Live Births   0 0 0 0         Home Medications    Prior to Admission medications   Medication Sig Start Date End Date Taking? Authorizing Provider  acetaminophen (TYLENOL) 325 MG tablet Take 650 mg by mouth every 6 (six) hours as needed for mild pain.    Yes [provider]  atorvastatin (LIPITOR) 40 MG tablet Take 1 tablet (40 mg total) by mouth daily. 06/26/17  Yes Alphonzo Grieve, MD  carvedilol (COREG) 25 MG tablet Take 1 tablet (25 mg total) by mouth 2 (two) times daily with a meal. 06/26/17  Yes Alphonzo Grieve, MD  diltiazem (CARDIZEM CD) 240 MG 24 hr capsule Take 1 capsule (240 mg total) by mouth daily. 06/26/17  Yes Alphonzo Grieve, MD  ferrous sulfate 325 (65 FE) MG tablet TAKE 1 Tablet BY MOUTH EVERY MORNING WITH BREAKFAST 10/04/16  Yes Alphonzo Grieve, MD  furosemide (LASIX) 40 MG tablet Take 1 tablet (40 mg total) by mouth daily. 06/23/17  Yes Alphonzo Grieve, MD  insulin NPH-regular Human (HUMULIN 70/30) (70-30) 100 UNIT/ML injection Inject 16 Units into the skin 2 (two) times daily with a meal. 02/24/17  Yes Rivet, Carly J, MD  megestrol (MEGACE) 40 MG tablet Take 1 tablet (40 mg total) by mouth daily. Internal Medicine Program 06/27/17  Yes Lavonia Drafts, MD  ondansetron (ZOFRAN) 4 MG tablet Take 1 tablet (4 mg total) by mouth every 8 (eight) hours as needed for nausea. 07/10/17 07/10/18 Yes Shela Leff, MD  PROVENTIL HFA 108 (90 Base) MCG/ACT inhaler INHALE 2 PUFFS BY MOUTH EVERY 4 HOURS AS NEEDED FOR COUGHING, WHEEZING, OR SHORTNESS OF BREATH 03/24/17  Yes Alphonzo Grieve, MD  ACCU-CHEK FASTCLIX LANCETS MISC Check blood sugar 3x a day,  diag code Z66.2947, insulin dependent 06/27/17   Alphonzo Grieve, MD  Blood Glucose Monitoring Suppl (ACCU-CHEK GUIDE) w/Device KIT 1 each by Does not apply route 3 (three) times daily. 05/18/17   Alphonzo Grieve, MD  clopidogrel (PLAVIX) 75 MG tablet TAKE 1 TABLET BY MOUTH DAILY. 05/24/17   Alphonzo Grieve, MD  glucose blood (ACCU-CHEK GUIDE) test strip Check blood sugar 3x a day  as instructed 06/27/17   Alphonzo Grieve, MD  Insulin Pen Needle (B-D UF III MINI PEN NEEDLES) 31G X 5 MM MISC Use for injection of [place] as instructed. 03/30/17   Alphonzo Grieve, MD  Insulin Syringe-Needle U-100 31G X 15/64" 0.3 ML MISC Use to inject insulin two times a day 02/24/17   Rivet, Carly J, MD  liraglutide (VICTOZA) 18 MG/3ML SOPN Inject 0.2 mLs (1.2 mg total) into the skin daily. Patient not taking: Reported on 07/24/2017 06/22/17  Alphonzo Grieve, MD    Family History Family History  Problem Relation Age of Onset  . Stroke Mother   . Diabetes Mother   . Hypertension Mother   . Aneurysm Mother   . Diabetes Father   . Hypertension Father   . Diabetes Sister   . Hypertension Sister   . Diabetes Maternal Grandmother   . Diabetes Maternal Grandfather   . Cancer Paternal Grandfather        Prostate    Social History Social History  Substance Use Topics  . Smoking status: Former Smoker    Packs/day: 0.00    Years: 0.00    Quit date: 08/29/2015  . Smokeless tobacco: Never Used  . Alcohol use No     Allergies   Adhesive [tape]   Review of Systems Review of Systems 10 Systems reviewed and are negative for acute change except as noted in the HPI.   Physical Exam Updated Vital Signs BP 127/72 (BP Location: Left Arm)   Pulse 82   Temp 98 F (36.7 C) (Oral)   Resp 18   Ht _0  (1.651 m)   Wt 103.3 kg (227 lb 11.2 oz)   LMP  (LMP Unknown)   SpO2 100%   BMI 37.89 kg/m   Physical Exam  Constitutional: She appears well-developed and well-nourished. No distress.  HENT:  Head:  Normocephalic and atraumatic.  Eyes: Conjunctivae are normal.  Neck: Neck supple.  Cardiovascular: Normal rate and regular rhythm.   No murmur heard. Pulmonary/Chest: Effort normal and breath sounds normal. No respiratory distress.  Abdominal: Soft. There is no tenderness.  Musculoskeletal: She exhibits no edema.  Neurological: She is alert.  Skin: Skin is warm and dry.  Psychiatric: She has a normal mood and affect.  Nursing note and vitals reviewed.    ED Treatments / Results  Labs (all labs ordered are listed, but only abnormal results are displayed) Labs Reviewed  COMPREHENSIVE METABOLIC PANEL - Abnormal; Notable for the following:       Result Value   Glucose, Bld 132 (*)    BUN 38 (*)    Creatinine, Ser 4.63 (*)    Calcium 8.5 (*)    Total Protein 5.9 (*)    Albumin 3.0 (*)    GFR calc non Af Amer 11 (*)    GFR calc Af Amer 12 (*)    All other components within normal limits  CBC - Abnormal; Notable for the following:    RBC 2.92 (*)    Hemoglobin 7.5 (*)    HCT 24.4 (*)    MCH 25.7 (*)    All other components within normal limits  URINALYSIS, ROUTINE W REFLEX MICROSCOPIC - Abnormal; Notable for the following:    Color, Urine STRAW (*)    Glucose, UA 50 (*)    Hgb urine dipstick SMALL (*)    Protein, ur >=300 (*)    Squamous Epithelial / LPF 0-5 (*)    All other components within normal limits  GLUCOSE, CAPILLARY - Abnormal; Notable for the following:    Glucose-Capillary 197 (*)    All other components within normal limits  CBC - Abnormal; Notable for the following:    RBC 3.37 (*)    Hemoglobin 9.1 (*)    HCT 28.5 (*)    All other components within normal limits  BASIC METABOLIC PANEL - Abnormal; Notable for the following:    CO2 21 (*)    Glucose, Bld 139 (*)  BUN 36 (*)    Creatinine, Ser 4.32 (*)    Calcium 8.5 (*)    GFR calc non Af Amer 12 (*)    GFR calc Af Amer 14 (*)    All other components within normal limits  GLUCOSE, CAPILLARY -  Abnormal; Notable for the following:    Glucose-Capillary 124 (*)    All other components within normal limits  GLUCOSE, CAPILLARY - Abnormal; Notable for the following:    Glucose-Capillary 133 (*)    All other components within normal limits  GLUCOSE, CAPILLARY - Abnormal; Notable for the following:    Glucose-Capillary 158 (*)    All other components within normal limits  GLUCOSE, CAPILLARY - Abnormal; Notable for the following:    Glucose-Capillary 117 (*)    All other components within normal limits  CBC - Abnormal; Notable for the following:    RBC 3.25 (*)    Hemoglobin 8.7 (*)    HCT 27.2 (*)    All other components within normal limits  GLUCOSE, CAPILLARY - Abnormal; Notable for the following:    Glucose-Capillary 163 (*)    All other components within normal limits  GLUCOSE, CAPILLARY - Abnormal; Notable for the following:    Glucose-Capillary 116 (*)    All other components within normal limits  CBC - Abnormal; Notable for the following:    RBC 3.27 (*)    Hemoglobin 8.7 (*)    HCT 27.5 (*)    All other components within normal limits  GLUCOSE, CAPILLARY - Abnormal; Notable for the following:    Glucose-Capillary 246 (*)    All other components within normal limits  GLUCOSE, CAPILLARY - Abnormal; Notable for the following:    Glucose-Capillary 135 (*)    All other components within normal limits  RENAL FUNCTION PANEL - Abnormal; Notable for the following:    Glucose, Bld 178 (*)    BUN 42 (*)    Creatinine, Ser 5.05 (*)    Calcium 8.5 (*)    Phosphorus 4.9 (*)    Albumin 2.9 (*)    GFR calc non Af Amer 10 (*)    GFR calc Af Amer 11 (*)    All other components within normal limits  GLUCOSE, CAPILLARY - Abnormal; Notable for the following:    Glucose-Capillary 124 (*)    All other components within normal limits  RENAL FUNCTION PANEL - Abnormal; Notable for the following:    CO2 21 (*)    Glucose, Bld 156 (*)    BUN 41 (*)    Creatinine, Ser 4.74 (*)     Calcium 8.7 (*)    Phosphorus 4.8 (*)    Albumin 2.9 (*)    GFR calc non Af Amer 10 (*)    GFR calc Af Amer 12 (*)    All other components within normal limits  CBC - Abnormal; Notable for the following:    RBC 3.42 (*)    Hemoglobin 9.2 (*)    HCT 28.7 (*)    All other components within normal limits  GLUCOSE, CAPILLARY - Abnormal; Notable for the following:    Glucose-Capillary 183 (*)    All other components within normal limits  GLUCOSE, CAPILLARY - Abnormal; Notable for the following:    Glucose-Capillary 175 (*)    All other components within normal limits  CBG MONITORING, ED - Abnormal; Notable for the following:    Glucose-Capillary 44 (*)    All other components within normal limits  LIPASE,  BLOOD  FERRITIN  TROPONIN I  PROTIME-INR  APTT  POC URINE PREG, ED  CBG MONITORING, ED  TYPE AND SCREEN  PREPARE RBC (CROSSMATCH)  SURGICAL PATHOLOGY    EKG  EKG Interpretation  Date/Time:  Monday July 24 2017 23:58:47 EDT Ventricular Rate:  86 PR Interval:    QRS Duration: 76 QT Interval:  358 QTC Calculation: 429 R Axis:   86 Text Interpretation:  Sinus rhythm Anterior infarct, old No significant change since last tracing Confirmed by Wandra Arthurs [phone]) on 07/26/2017 11:02:35 AM       Radiology US Renal  Result Date: 07/26/2017 CLINICAL DATA:  Acute kidney injury EXAM: RENAL / URINARY TRACT ULTRASOUND COMPLETE COMPARISON:  None. FINDINGS: Right Kidney: Length: 11.2 cm. Echogenicity within normal limits. No mass or hydronephrosis visualized. Left Kidney: Length: 11.3 cm. Echogenicity within normal limits. No mass or hydronephrosis visualized. Bladder: Appears normal for degree of bladder distention. IMPRESSION: Unremarkable renal ultrasound. Electronically Signed   By: Rolm Baptise M.D.   On: 07/26/2017 12:40   US Biopsy (kidney)  Result Date: 07/27/2017 INDICATION: 41 year old female with a history of chronic renal insufficiency. EXAM: ULTRASOUND GUIDED  MEDICAL RENAL BIOPSY MEDICATIONS: None. ANESTHESIA/SEDATION: Moderate (conscious) sedation was employed during this procedure. A total of Versed 1.5 mg and Fentanyl 75 mcg was administered intravenously. Moderate Sedation Time: 13 minutes. The patient's level of consciousness and vital signs were monitored continuously by radiology nursing throughout the procedure under my direct supervision. FLUOROSCOPY TIME:  None COMPLICATIONS: None PROCEDURE: Informed written consent was obtained from the patient after a thorough discussion of the procedural risks, benefits and alternatives. All questions were addressed. Maximal Sterile Barrier Technique was utilized including caps, mask, sterile gowns, sterile gloves, sterile drape, hand hygiene and skin antiseptic. A timeout was performed prior to the initiation of the procedure. Patient was positioned prone position on the gantry table. Images were stored sent to PACs. Once the patient is prepped and draped in the usual sterile fashion, the skin and subcutaneous tissues overlying the left kidney were generously infiltrated 1% lidocaine for local anesthesia. Using ultrasound guidance, a 15 gauge guide needle was advanced into the lower cortex of the left kidney. Once we confirmed location of the needle tip, 2 separate 16 gauge core biopsy were achieved. Two Gel-Foam pledgets were infused with a small amount of saline. The needle was removed. Final images were stored. The patient tolerated the procedure well and remained hemodynamically stable throughout. No complications were encountered and no significant blood loss encountered. IMPRESSION: Status post ultrasound-guided medical renal biopsy. Tissue specimen sent to pathology for complete histopathologic analysis. Signed, Dulcy Fanny. Earleen Newport, DO [facility] Radiology Electronically Signed   By: Corrie Mckusick D.O.   On: 07/27/2017 07:52    Procedures Procedures (including  critical care time)  Medications Ordered in ED Medications  insulin aspart (novoLOG) injection 0-9 Units (2 Units Subcutaneous Given 07/27/17 1735)  insulin aspart (novoLOG) injection 3 Units (3 Units Subcutaneous Given 07/27/17 1735)  carvedilol (COREG) tablet 25 mg (25 mg Oral Given 07/27/17 1735)  diltiazem (CARDIZEM CD) 24 hr capsule 240 mg (240 mg Oral Given 07/27/17 1011)  furosemide (LASIX) tablet 40 mg (40 mg Oral Given 07/27/17 1011)  megestrol (MEGACE) tablet 40 mg (40 mg Oral Given 07/27/17 1011)  insulin glargine (LANTUS) injection 10 Units (10 Units Subcutaneous Given 07/26/17 2249)  acetaminophen (TYLENOL) tablet 650 mg (650 mg Oral Given 07/26/17 1839)    Or  acetaminophen (TYLENOL)  suppository 650 mg ( Rectal See Alternative 07/26/17 1839)  senna-docusate (Senokot-S) tablet 1 tablet (not administered)  Darbepoetin Alfa (ARANESP) injection 100 mcg (100 mcg Subcutaneous Given 07/25/17 1650)  lidocaine (PF) (XYLOCAINE) 1 % injection (not administered)  fentaNYL (SUBLIMAZE) 100 MCG/2ML injection (not administered)  midazolam (VERSED) 2 MG/2ML injection (not administered)  gelatin adsorbable (GELFOAM/SURGIFOAM) 12-7 MM sponge 12-7 mm (not administered)  0.9 %  sodium chloride infusion ( Intravenous New Bag/Given 07/25/17 0041)  fentaNYL (SUBLIMAZE) injection (25 mcg Intravenous Given 07/26/17 1350)  midazolam (VERSED) injection (0.5 mg Intravenous Given 07/26/17 1350)  0.9 %  sodium chloride infusion (10 mL/hr Intravenous New Bag/Given 07/26/17 1348)     Initial Impression / Assessment and Plan / ED Course  I have reviewed the triage vital signs and the nursing notes.  Pertinent labs & imaging results that were available during my care of the patient were reviewed by me and considered in my medical decision making (see chart for details).     Final Clinical Impressions(s) / ED Diagnoses   Final diagnoses:  AKI (acute kidney injury) ([place])  ESRD (end stage renal disease) ([place])    Patient  has symptomatic anemic with chronic renal failure and plan will be for admission as per requested by her nephrologist. At this time, vital signs are stable, patient's mental status is clear and she is nontoxic. New Prescriptions Current Discharge Medication List       Charlesetta Shanks, MD 07/27/17 [phone]

## 2017-07-24 NOTE — ED Triage Notes (Addendum)
Dr. Hollie Salk from [facility] is to notified this patient is here.  Pt is being treated for kidney failure but no dialysis.  She is to have a kidney biopsy soon.  Saturday she was out at a football game and drunk gatorade and water but does have decreased in urine.  Pt reports metallic taste in mouth, left flank pain and retaining fluid and states she needs a blood transfusion.  Pt was told her hemoglobin was low and she was going to have a scheduled transfusion and she was not feeling well, so they sent her here. Spoke with Dr. Hollie Salk and she would like for EDP to see:)

## 2017-07-24 NOTE — H&P (Signed)
Date: 07/25/2017               Patient Name:  Jane [name] MRN: [id number]  DOB: Mar 15, 1976 Age / Sex: 41 y.o., female   PCP: Alphonzo Grieve, MD         Medical Service: Internal Medicine Teaching Service         Attending Physician: Dr. Lucious Groves, DO    First Contact: Dr. Berline Lopes Pager: [phone]  Second Contact: Dr. Reesa Chew Pager: [phone]       After Hours (After 5p/  First Contact Pager: [phone]  weekends / holidays): Second Contact Pager: [phone]   Chief Complaint: Low hemoglobin and progressive renal failure  History of Present Illness:  41 y.o f with pmh of IDDM with retinopathy, essential htn, iron deficiency anemia, cad s/p bare metal stents pci to prox lad was sent by nephrology (Dr. Bishop [place] office) due to low hb level and renal failure symptoms.  The patient states that she has been having chest pain for the past few days that is pressure like and achy in nature, 5/10 intensity, present over anterior chest, non-radiating, and not changed with position or breath. She has also been having sob.   She states that she has lower extremity swelling and feels like she has had a lot of weight gain in the past 4 months. She feels like she has been urinating less lately and has occasional dysuria. Last weekend she drank 4 bottles of gatorade and a bottle of water and did not urinate till 8 hrs later. She does not have any burning while urinating. She has been having mettalic taste in her mouth, dizziness, nausea but no vomiting, and occasional blurry vision.  The patient was referred by Dr. Jari Favre to nephrology due to her progressive CKD since 2015. The patient was recommended to get a kidney biopsy but was unable to on two separate occasions due to presence of uti and hypoglycemia event prompting which caused her not to be npo for the biopsy. During the second attempt to get her biopsy it was found that the patient had a low hemoglobin.  She also has constipation and  blood in her stool for which she was seen at the [facility] clinic. She does not have menstrual periods anymore. Her last menstrual period was in May 2017. She used to have heavy menstural bleeding with clots.  Her baseline hb has been approximately 9-10 g/dL over the past year. Transvaginal ultrasound done in January 2012 did not show any prssence of fibroids or abnormalities.   ED Course: CBC showing hb=7.5, hct=24.4, CMP, CBG =44 on admission and 77 on repeat.   Meds:  Current Facility-Administered Medications for the 07/24/17 encounter [facility] Encounter)  Medication  . sodium chloride 0.9 % bolus 500 mL   Current Meds  Medication Sig  . acetaminophen (TYLENOL) 325 MG tablet Take 650 mg by mouth every 6 (six) hours as needed for mild pain.   Marland Kitchen atorvastatin (LIPITOR) 40 MG tablet Take 1 tablet (40 mg total) by mouth daily.  . carvedilol (COREG) 25 MG tablet Take 1 tablet (25 mg total) by mouth 2 (two) times daily with a meal.  . diltiazem (CARDIZEM CD) 240 MG 24 hr capsule Take 1 capsule (240 mg total) by mouth daily.  . ferrous sulfate 325 (65 FE) MG tablet TAKE 1 Tablet BY MOUTH EVERY MORNING WITH BREAKFAST  . furosemide (LASIX) 40 MG tablet Take 1 tablet (40 mg total) by mouth daily.  Marland Kitchen  insulin NPH-regular Human (HUMULIN 70/30) (70-30) 100 UNIT/ML injection Inject 16 Units into the skin 2 (two) times daily with a meal.  . megestrol (MEGACE) 40 MG tablet Take 1 tablet (40 mg total) by mouth daily. Internal Medicine Program  . ondansetron (ZOFRAN) 4 MG tablet Take 1 tablet (4 mg total) by mouth every 8 (eight) hours as needed for nausea.  Marland Kitchen PROVENTIL HFA 108 (90 Base) MCG/ACT inhaler INHALE 2 PUFFS BY MOUTH EVERY 4 HOURS AS NEEDED FOR COUGHING, WHEEZING, OR SHORTNESS OF BREATH     Allergies: Allergies as of 07/24/2017 - Review Complete 07/24/2017  Allergen Reaction Noted  . Adhesive [tape] Other (See Comments) 11/24/2016   Past Medical History:  Diagnosis Date  . Asthma    prn inhaler    . CAD S/P BMS PCI to prox LAD 03/31/2016   Ost LAD to Mid LAD lesion, 90% stenosed. Post intervention - Vision BMS 3.0 mm x 18 mm (~3.5 mm) there is a 0% residual stenosis.   . Charcot foot due to diabetes mellitus ([place]) 11/2016   left  . Depression   . Diabetic neuropathy (HCC)    feet  . H/O non-ST elevation myocardial infarction (NSTEMI) 03/2016   Found in 9% mid LAD lesion treated with bare-metal stent (BMS) PCI - vision BMS 3.0 mm x 18 mm  . History of MRSA infection   . Hyperlipidemia   . Hypertension    medication dose increased 11/22/2015; has been taking med. consistently since 03/2016, per pt.  . Insulin dependent diabetes mellitus ([place])   . Iron deficiency anemia    takes iron supplement  . Retinopathy of both eyes   . Sickle cell trait ([place])   . 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 About two six ounce glasses that are filled 1/4 full Previous marijuana user but quit in June 2015  Review of Systems: A complete ROS was negative except as per HPI.   Physical Exam: Blood pressure (!) 158/77, pulse 87, temperature 99.2 F (37.3 C), temperature source Oral, resp. rate 16, SpO2 100 %. Physical Exam  Constitutional: She appears well-developed and well-nourished. No distress.  HENT:  Head: Normocephalic and atraumatic.  Eyes: Conjunctivae are normal.  Cardiovascular: Normal rate, regular rhythm, normal heart sounds and intact distal pulses.   Pulmonary/Chest: Effort normal and breath sounds normal. No respiratory distress. She has no wheezes.  Abdominal: Soft. Bowel sounds are normal. She exhibits no distension. There is no tenderness.   Genitourinary: There is no tenderness on the left labia.  Genitourinary Comments: Left flank tenderness to palpation  Skin: Capillary refill takes less than 2 seconds.  Psychiatric: She has a normal mood and affect. Her behavior is normal. Judgment and thought content normal.    EKG: sinus rhythm with old anterior infarct  CXR: No acute cardiopulmonary disease  Assessment & Plan by Problem:  Symptomatic Normocytic Anemia The patient's hb=7.5, hct=24.4, mcv=83.6 The cause of the patient's anemia is likely to be multifocal. The patient's poor kidney function can be contributing to the anemia due to the decreased EPO.  She maybe having iron deficiency anemia due to blood loss. Sources of bleeding include the bloody stool. Uterine ultrasound on 11/28/10 did not show any evidence  of uterine fibroids. The patient has been taking ferrous sulfate 325mg  every morning.  -Transfuse 1 PRBC  -Monitor H&H -Consider iron studies -Type and screen  ESRD The patient's GFR=12 which puts her in range for ESRD -Consult nephrology to see patient and determine date for renal biopsy -BMP  Diabetes Mellitus The patient uses victoza and humulin at home. Due to the recent hypoglycemic events, will monitor insulin dosing carefully.  -Give lantus 10u qd, novolog tid and ssi   Dispo: Admit patient to Inpatient with expected length of stay greater than 2 midnights.  Lars Mage, MD Internal Medicine PGY1 Pager:[phone] 07/25/2017, 1:07 AM

## 2017-07-25 DIAGNOSIS — E1169 Type 2 diabetes mellitus with other specified complication: Secondary | ICD-10-CM | POA: Diagnosis not present

## 2017-07-25 DIAGNOSIS — Z7902 Long term (current) use of antithrombotics/antiplatelets: Secondary | ICD-10-CM | POA: Diagnosis not present

## 2017-07-25 DIAGNOSIS — D649 Anemia, unspecified: Secondary | ICD-10-CM

## 2017-07-25 DIAGNOSIS — N184 Chronic kidney disease, stage 4 (severe): Secondary | ICD-10-CM | POA: Diagnosis not present

## 2017-07-25 DIAGNOSIS — E669 Obesity, unspecified: Secondary | ICD-10-CM | POA: Diagnosis present

## 2017-07-25 DIAGNOSIS — Z6838 Body mass index (BMI) 38.0-38.9, adult: Secondary | ICD-10-CM | POA: Diagnosis not present

## 2017-07-25 DIAGNOSIS — Z87440 Personal history of urinary (tract) infections: Secondary | ICD-10-CM | POA: Diagnosis not present

## 2017-07-25 DIAGNOSIS — E113493 Type 2 diabetes mellitus with severe nonproliferative diabetic retinopathy without macular edema, bilateral: Secondary | ICD-10-CM | POA: Diagnosis present

## 2017-07-25 DIAGNOSIS — E1140 Type 2 diabetes mellitus with diabetic neuropathy, unspecified: Secondary | ICD-10-CM | POA: Diagnosis present

## 2017-07-25 DIAGNOSIS — J45909 Unspecified asthma, uncomplicated: Secondary | ICD-10-CM | POA: Diagnosis present

## 2017-07-25 DIAGNOSIS — F329 Major depressive disorder, single episode, unspecified: Secondary | ICD-10-CM | POA: Diagnosis present

## 2017-07-25 DIAGNOSIS — N186 End stage renal disease: Secondary | ICD-10-CM | POA: Diagnosis present

## 2017-07-25 DIAGNOSIS — E785 Hyperlipidemia, unspecified: Secondary | ICD-10-CM | POA: Diagnosis present

## 2017-07-25 DIAGNOSIS — Z8742 Personal history of other diseases of the female genital tract: Secondary | ICD-10-CM | POA: Diagnosis not present

## 2017-07-25 DIAGNOSIS — E11610 Type 2 diabetes mellitus with diabetic neuropathic arthropathy: Secondary | ICD-10-CM | POA: Diagnosis present

## 2017-07-25 DIAGNOSIS — N179 Acute kidney failure, unspecified: Secondary | ICD-10-CM | POA: Diagnosis not present

## 2017-07-25 DIAGNOSIS — Z955 Presence of coronary angioplasty implant and graft: Secondary | ICD-10-CM | POA: Diagnosis not present

## 2017-07-25 DIAGNOSIS — Z87891 Personal history of nicotine dependence: Secondary | ICD-10-CM | POA: Diagnosis not present

## 2017-07-25 DIAGNOSIS — D509 Iron deficiency anemia, unspecified: Secondary | ICD-10-CM | POA: Diagnosis present

## 2017-07-25 DIAGNOSIS — I2510 Atherosclerotic heart disease of native coronary artery without angina pectoris: Secondary | ICD-10-CM | POA: Diagnosis present

## 2017-07-25 DIAGNOSIS — E11319 Type 2 diabetes mellitus with unspecified diabetic retinopathy without macular edema: Secondary | ICD-10-CM | POA: Diagnosis not present

## 2017-07-25 DIAGNOSIS — Z8249 Family history of ischemic heart disease and other diseases of the circulatory system: Secondary | ICD-10-CM | POA: Diagnosis not present

## 2017-07-25 DIAGNOSIS — N185 Chronic kidney disease, stage 5: Secondary | ICD-10-CM | POA: Diagnosis not present

## 2017-07-25 DIAGNOSIS — E11649 Type 2 diabetes mellitus with hypoglycemia without coma: Secondary | ICD-10-CM | POA: Diagnosis present

## 2017-07-25 DIAGNOSIS — I120 Hypertensive chronic kidney disease with stage 5 chronic kidney disease or end stage renal disease: Secondary | ICD-10-CM | POA: Diagnosis present

## 2017-07-25 DIAGNOSIS — E1122 Type 2 diabetes mellitus with diabetic chronic kidney disease: Secondary | ICD-10-CM | POA: Diagnosis present

## 2017-07-25 DIAGNOSIS — E784 Other hyperlipidemia: Secondary | ICD-10-CM | POA: Diagnosis not present

## 2017-07-25 DIAGNOSIS — Z833 Family history of diabetes mellitus: Secondary | ICD-10-CM | POA: Diagnosis not present

## 2017-07-25 DIAGNOSIS — Z794 Long term (current) use of insulin: Secondary | ICD-10-CM | POA: Diagnosis not present

## 2017-07-25 DIAGNOSIS — I252 Old myocardial infarction: Secondary | ICD-10-CM | POA: Diagnosis not present

## 2017-07-25 DIAGNOSIS — D573 Sickle-cell trait: Secondary | ICD-10-CM | POA: Diagnosis present

## 2017-07-25 DIAGNOSIS — M14672 Charcot's joint, left ankle and foot: Secondary | ICD-10-CM | POA: Diagnosis present

## 2017-07-25 DIAGNOSIS — D631 Anemia in chronic kidney disease: Secondary | ICD-10-CM | POA: Diagnosis present

## 2017-07-25 DIAGNOSIS — Z9889 Other specified postprocedural states: Secondary | ICD-10-CM | POA: Diagnosis not present

## 2017-07-25 LAB — BASIC METABOLIC PANEL
Anion gap: 9 (ref 5–15)
BUN: 36 mg/dL — ABNORMAL HIGH (ref 6–20)
CO2: 21 mmol/L — AB (ref 22–32)
CREATININE: 4.32 mg/dL — AB (ref 0.44–1.00)
Calcium: 8.5 mg/dL — ABNORMAL LOW (ref 8.9–10.3)
Chloride: 109 mmol/L (ref 101–111)
GFR, EST AFRICAN AMERICAN: 14 mL/min — AB (ref >60)
GFR, EST NON AFRICAN AMERICAN: 12 mL/min — AB (ref >60)
Glucose, Bld: 139 mg/dL — ABNORMAL HIGH (ref 65–99)
Potassium: 4 mmol/L (ref 3.5–5.1)
SODIUM: 139 mmol/L (ref 135–145)

## 2017-07-25 LAB — CBC
HEMATOCRIT: 28.5 % — AB (ref 36.0–46.0)
Hemoglobin: 9.1 g/dL — ABNORMAL LOW (ref 12.0–15.0)
MCH: 27 pg (ref 26.0–34.0)
MCHC: 31.9 g/dL (ref 30.0–36.0)
MCV: 84.6 fL (ref 78.0–100.0)
PLATELETS: 241 10*3/uL (ref 150–400)
RBC: 3.37 MIL/uL — AB (ref 3.87–5.11)
RDW: 14 % (ref 11.5–15.5)
WBC: 7.2 10*3/uL (ref 4.0–10.5)

## 2017-07-25 LAB — GLUCOSE, CAPILLARY
GLUCOSE-CAPILLARY: 133 mg/dL — AB (ref 65–99)
GLUCOSE-CAPILLARY: 197 mg/dL — AB (ref 65–99)
GLUCOSE-CAPILLARY: 163 mg/dL — AB (ref 65–99)
Glucose-Capillary: 124 mg/dL — ABNORMAL HIGH (ref 65–99)
Glucose-Capillary: 158 mg/dL — ABNORMAL HIGH (ref 65–99)
Glucose-Capillary: 117 mg/dL — ABNORMAL HIGH (ref 65–99)

## 2017-07-25 LAB — TROPONIN I: Troponin I: <0.03 ng/mL (ref <0.03)

## 2017-07-25 LAB — PREPARE RBC (CROSSMATCH)

## 2017-07-25 LAB — FERRITIN: Ferritin: 106 ng/mL (ref 11–307)

## 2017-07-25 MED ORDER — MEGESTROL ACETATE 40 MG PO TABS
40.0000 mg | ORAL_TABLET | Freq: Every day | ORAL | Status: DC
  Administered 2017-07-25 – 2017-08-01 (×8): 40 mg via ORAL
  Filled 2017-07-25 (×8): qty 1

## 2017-07-25 MED ORDER — CARVEDILOL 25 MG PO TABS
25.0000 mg | ORAL_TABLET | Freq: Two times a day (BID) | ORAL | Status: DC
  Administered 2017-07-25 – 2017-08-01 (×14): 25 mg via ORAL
  Filled 2017-07-25 (×14): qty 1

## 2017-07-25 MED ORDER — SENNOSIDES-DOCUSATE SODIUM 8.6-50 MG PO TABS
1.0000 | ORAL_TABLET | Freq: Every evening | ORAL | Status: DC | PRN
  Filled 2017-07-25: qty 1

## 2017-07-25 MED ORDER — ACETAMINOPHEN 650 MG RE SUPP: 650.0000 mg | Freq: Four times a day (QID) | RECTAL | Status: DC | PRN

## 2017-07-25 MED ORDER — ACETAMINOPHEN 325 MG PO TABS
650.0000 mg | ORAL_TABLET | Freq: Four times a day (QID) | ORAL | Status: DC | PRN
  Administered 2017-07-26 – 2017-08-01 (×4): 650 mg via ORAL
  Filled 2017-07-25 (×4): qty 2

## 2017-07-25 MED ORDER — DARBEPOETIN ALFA 100 MCG/0.5ML IJ SOSY
100.0000 ug | PREFILLED_SYRINGE | INTRAMUSCULAR | Status: DC
  Administered 2017-07-25: 100 ug via SUBCUTANEOUS
  Filled 2017-07-25 (×2): qty 0.5

## 2017-07-25 MED ORDER — DILTIAZEM HCL ER COATED BEADS 120 MG PO CP24
240.0000 mg | ORAL_CAPSULE | Freq: Every day | ORAL | Status: DC
  Administered 2017-07-25 – 2017-08-01 (×8): 240 mg via ORAL
  Filled 2017-07-25 (×8): qty 2

## 2017-07-25 MED ORDER — FUROSEMIDE 40 MG PO TABS
40.0000 mg | ORAL_TABLET | Freq: Every day | ORAL | Status: DC
  Administered 2017-07-25 – 2017-08-01 (×8): 40 mg via ORAL
  Filled 2017-07-25 (×8): qty 1

## 2017-07-25 MED ORDER — INSULIN GLARGINE 100 UNIT/ML ~~LOC~~ SOLN
10.0000 [IU] | Freq: Every day | SUBCUTANEOUS | Status: DC
  Administered 2017-07-25 – 2017-07-31 (×7): 10 [IU] via SUBCUTANEOUS
  Filled 2017-07-25 (×8): qty 0.1

## 2017-07-25 NOTE — Consult Note (Signed)
[facility] Consult Note     Date: 07/25/2017                  Patient Name:  Jane [name]  MRN: [id number]  DOB: Jun 09, 1976  Age / Sex: 41 y.o., female         PCP: Alphonzo Grieve, MD                 Service Requesting Consult: IMTS                 Reason for Consult: CKD and flank pain            Chief Complaint: n/v flank pain  HPI: Pt is a 36F with a  PMH significant for DM II, HTN, CAD, and Charcot foot who I follow in clinic.  I first met her on Aug 24 when she was referred to me for evaluation of declining renal function.  She had a very rapid decline from 1.4--5.3 in the past year.    When I saw her in clinic, she had an ANA of 1:1280 and an active urine sediment.  Plans were made for biopsy to see if there was anything reversible there.  Her biopsy was rescheduled twice for various reasons- first time she had an Enterococcus UTI which I treated and then the second time she didn't have a ride and wasn't NPO.  She called our clinic yesterday reporting n/v, metallic taste in mouth, and flank pain.  Was advised to go to ED.  We had previously discussed in a prior clinic visit the possibility that she could end up on dialysis.    Past Medical History:  Diagnosis Date  . Asthma    prn inhaler  . CAD S/P BMS PCI to prox LAD 03/31/2016   Ost LAD to Mid LAD lesion, 90% stenosed. Post intervention - Vision BMS 3.0 mm x 18 mm (~3.5 mm) there is a 0% residual stenosis.   . Charcot foot due to diabetes mellitus ([place]) 11/2016   left  . Depression   . Diabetic neuropathy (HCC)    feet  . H/O non-ST elevation myocardial infarction (NSTEMI) 03/2016   Found in 9% mid LAD lesion treated with bare-metal stent (BMS) PCI - vision BMS 3.0 mm x 18 mm  . History of MRSA infection   . Hyperlipidemia   . Hypertension    medication dose increased 11/22/2015; has been taking med. consistently since 03/2016, per pt.  . Insulin dependent diabetes mellitus ([place])   . Iron  deficiency anemia    takes iron supplement  . Retinopathy of both eyes   . Sickle cell trait ([place])   . 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 ACHILLES TENDON LENGTHENING Left 11/24/2016   Procedure: Left Achilles tendon lengthening (open);  Surgeon: Wylene Simmer, MD;  Location: [place];  Service: Orthopedics;  Laterality: Left;  . CALCANEAL OSTEOTOMY Left 11/24/2016   Procedure: Left hindfoot osteotomy and fusion;  Surgeon: Wylene Simmer, MD;  Location: [place];  Service: Orthopedics;  Laterality: Left;  . CARDIAC CATHETERIZATION N/A 03/31/2016   Procedure: Left Heart Cath and Coronary Angiography;  Surgeon: Lorretta Harp, MD;  Location: [facility] INVASIVE CV LAB: 90% early mLAD, normal LV Fxn  . CARDIAC CATHETERIZATION N/A 03/31/2016   Procedure: Coronary Stent Intervention;  Surgeon: Lorretta Harp, MD;  Location: [place] CV LAB;  Service: Cardiovascular: PCI  to mLAD BMS Vision 3.0 mm x 18 mm  . CESAREAN SECTION  1997  . TRANSTHORACIC ECHOCARDIOGRAM  03/2016   Normal LV size and thickness. EF 60-65%. GR 1 DD. Otherwise essentially normal.  . TUBAL LIGATION  1997    Family History  Problem Relation Age of Onset  . Stroke Mother   . Diabetes Mother   . Hypertension Mother   . Aneurysm Mother   . Diabetes Father   . Hypertension Father   . Diabetes Sister   . Hypertension Sister   . Diabetes Maternal Grandmother   . Diabetes Maternal Grandfather   . Cancer Paternal Grandfather        Prostate   Social History:  reports that she quit smoking about 22 months ago. She smoked 0.00 packs per day for 0.00 years. She has never used smokeless tobacco. She reports that she does not drink alcohol or use drugs.  Allergies:  Allergies  Allergen Reactions  . Adhesive [Tape] Other (See Comments)    Irritation     Facility-Administered Medications Prior to Admission  Medication Dose Route  Frequency Provider Last Rate Last Dose  . sodium chloride 0.9 % bolus 500 mL  500 mL Intravenous Once Shela Leff, MD       Medications Prior to Admission  Medication Sig Dispense Refill  . acetaminophen (TYLENOL) 325 MG tablet Take 650 mg by mouth every 6 (six) hours as needed for mild pain.     Marland Kitchen atorvastatin (LIPITOR) 40 MG tablet Take 1 tablet (40 mg total) by mouth daily. 30 tablet 11  . carvedilol (COREG) 25 MG tablet Take 1 tablet (25 mg total) by mouth 2 (two) times daily with a meal. 60 tablet 3  . diltiazem (CARDIZEM CD) 240 MG 24 hr capsule Take 1 capsule (240 mg total) by mouth daily. 30 capsule 3  . ferrous sulfate 325 (65 FE) MG tablet TAKE 1 Tablet BY MOUTH EVERY MORNING WITH BREAKFAST 90 tablet 3  . furosemide (LASIX) 40 MG tablet Take 1 tablet (40 mg total) by mouth daily. 30 tablet 2  . insulin NPH-regular Human (HUMULIN 70/30) (70-30) 100 UNIT/ML injection Inject 16 Units into the skin 2 (two) times daily with a meal. 10 mL 11  . megestrol (MEGACE) 40 MG tablet Take 1 tablet (40 mg total) by mouth daily. Internal Medicine Program 60 tablet 1  . ondansetron (ZOFRAN) 4 MG tablet Take 1 tablet (4 mg total) by mouth every 8 (eight) hours as needed for nausea. 15 tablet 0  . PROVENTIL HFA 108 (90 Base) MCG/ACT inhaler INHALE 2 PUFFS BY MOUTH EVERY 4 HOURS AS NEEDED FOR COUGHING, WHEEZING, OR SHORTNESS OF BREATH 20.1 g 2  . ACCU-CHEK FASTCLIX LANCETS MISC Check blood sugar 3x a day, diag code E11.3493, insulin dependent 306 each 1  . Blood Glucose Monitoring Suppl (ACCU-CHEK GUIDE) w/Device KIT 1 each by Does not apply route 3 (three) times daily. 1 kit 0  . clopidogrel (PLAVIX) 75 MG tablet TAKE 1 TABLET BY MOUTH DAILY. 30 tablet 11  . glucose blood (ACCU-CHEK GUIDE) test strip Check blood sugar 3x a day  as instructed 100 each 12  . Insulin Pen Needle (B-D UF III MINI PEN NEEDLES) 31G X 5 MM MISC Use for injection of [place] as instructed. 60 each 2  . Insulin Syringe-Needle  U-100 31G X 15/64" 0.3 ML MISC Use to inject insulin two times a day 100 each 12  . liraglutide (VICTOZA) 18 MG/3ML SOPN Inject 0.2  mLs (1.2 mg total) into the skin daily. (Patient not taking: Reported on 07/24/2017) 12 mL 2    Results for orders placed or performed during the hospital encounter of 07/24/17 (from the past 48 hour(s))  Lipase, blood     Status: None   Collection Time: 07/24/17  1:00 PM  Result Value Ref Range   Lipase 34 11 - 51 U/L  Comprehensive metabolic panel     Status: Abnormal   Collection Time: 07/24/17  1:00 PM  Result Value Ref Range   Sodium 138 135 - 145 mmol/L   Potassium 4.4 3.5 - 5.1 mmol/L   Chloride 108 101 - 111 mmol/L   CO2 23 22 - 32 mmol/L   Glucose, Bld 132 (H) 65 - 99 mg/dL   BUN 38 (H) 6 - 20 mg/dL   Creatinine, Ser 4.63 (H) 0.44 - 1.00 mg/dL   Calcium 8.5 (L) 8.9 - 10.3 mg/dL   Total Protein 5.9 (L) 6.5 - 8.1 g/dL   Albumin 3.0 (L) 3.5 - 5.0 g/dL   AST 22 15 - 41 U/L   ALT 18 14 - 54 U/L   Alkaline Phosphatase 78 38 - 126 U/L   Total Bilirubin 0.4 0.3 - 1.2 mg/dL   GFR calc non Af Amer 11 (L) >60 mL/min   GFR calc Af Amer 12 (L) >60 mL/min    Comment: (NOTE) The eGFR has been calculated using the CKD EPI equation. This calculation has not been validated in all clinical situations. eGFR's persistently <60 mL/min signify possible Chronic Kidney Disease.    Anion gap 7 5 - 15  CBC     Status: Abnormal   Collection Time: 07/24/17  1:00 PM  Result Value Ref Range   WBC 6.4 4.0 - 10.5 K/uL   RBC 2.92 (L) 3.87 - 5.11 MIL/uL   Hemoglobin 7.5 (L) 12.0 - 15.0 g/dL   HCT 24.4 (L) 36.0 - 46.0 %   MCV 83.6 78.0 - 100.0 fL   MCH 25.7 (L) 26.0 - 34.0 pg   MCHC 30.7 30.0 - 36.0 g/dL   RDW 13.9 11.5 - 15.5 %   Platelets 233 150 - 400 K/uL  CBG monitoring, ED     Status: Abnormal   Collection Time: 07/24/17  4:05 PM  Result Value Ref Range   Glucose-Capillary 44 (LL) 65 - 99 mg/dL  CBG monitoring, ED     Status: None   Collection Time:  07/24/17  4:24 PM  Result Value Ref Range   Glucose-Capillary 77 65 - 99 mg/dL  Glucose, capillary     Status: Abnormal   Collection Time: 07/24/17  4:49 PM  Result Value Ref Range   Glucose-Capillary 133 (H) 65 - 99 mg/dL  Urinalysis, Routine w reflex microscopic     Status: Abnormal   Collection Time: 07/24/17  9:49 PM  Result Value Ref Range   Color, Urine STRAW (A) YELLOW   APPearance CLEAR CLEAR   Specific Gravity, Urine 1.010 1.005 - 1.030   pH 6.0 5.0 - 8.0   Glucose, UA 50 (A) NEGATIVE mg/dL   Hgb urine dipstick SMALL (A) NEGATIVE   Bilirubin Urine NEGATIVE NEGATIVE   Ketones, ur NEGATIVE NEGATIVE mg/dL   Protein, ur >=300 (A) NEGATIVE mg/dL   Nitrite NEGATIVE NEGATIVE   Leukocytes, UA NEGATIVE NEGATIVE   RBC / HPF 6-30 0 - 5 RBC/hpf   WBC, UA 0-5 0 - 5 WBC/hpf   Bacteria, UA NONE SEEN NONE SEEN   Squamous  Epithelial / LPF 0-5 (A) NONE SEEN  POC urine preg, ED     Status: None   Collection Time: 07/24/17 10:04 PM  Result Value Ref Range   Preg Test, Ur NEGATIVE NEGATIVE    Comment:        THE SENSITIVITY OF THIS METHODOLOGY IS >24 mIU/mL   Type and screen [place]     Status: None (Preliminary result)   Collection Time: 07/24/17 10:21 PM  Result Value Ref Range   ABO/RH(D) O POS    Antibody Screen NEG    Sample Expiration 07/27/2017    Unit Number [id number]    Blood Component Type RED CELLS,LR    Unit division 00    Status of Unit ALLOCATED    Transfusion Status OK TO TRANSFUSE    Crossmatch Result Compatible    Unit Number [id number]    Blood Component Type RED CELLS,LR    Unit division 00    Status of Unit ISSUED    Transfusion Status OK TO TRANSFUSE    Crossmatch Result Compatible   Ferritin     Status: None   Collection Time: 07/24/17 11:36 PM  Result Value Ref Range   Ferritin 106 11 - 307 ng/mL  Troponin I     Status: None   Collection Time: 07/24/17 11:36 PM  Result Value Ref Range   Troponin I <0.03 <0.03 ng/mL   Prepare RBC     Status: None   Collection Time: 07/25/17 12:00 AM  Result Value Ref Range   Order Confirmation ORDER PROCESSED BY BLOOD BANK   Glucose, capillary     Status: Abnormal   Collection Time: 07/25/17  2:27 AM  Result Value Ref Range   Glucose-Capillary 197 (H) 65 - 99 mg/dL  CBC     Status: Abnormal   Collection Time: 07/25/17  5:37 AM  Result Value Ref Range   WBC 7.2 4.0 - 10.5 K/uL   RBC 3.37 (L) 3.87 - 5.11 MIL/uL   Hemoglobin 9.1 (L) 12.0 - 15.0 g/dL   HCT 28.5 (L) 36.0 - 46.0 %   MCV 84.6 78.0 - 100.0 fL   MCH 27.0 26.0 - 34.0 pg   MCHC 31.9 30.0 - 36.0 g/dL   RDW 14.0 11.5 - 15.5 %   Platelets 241 150 - 400 K/uL  Basic metabolic panel     Status: Abnormal   Collection Time: 07/25/17  5:37 AM  Result Value Ref Range   Sodium 139 135 - 145 mmol/L   Potassium 4.0 3.5 - 5.1 mmol/L   Chloride 109 101 - 111 mmol/L   CO2 21 (L) 22 - 32 mmol/L   Glucose, Bld 139 (H) 65 - 99 mg/dL   BUN 36 (H) 6 - 20 mg/dL   Creatinine, Ser 4.32 (H) 0.44 - 1.00 mg/dL   Calcium 8.5 (L) 8.9 - 10.3 mg/dL   GFR calc non Af Amer 12 (L) >60 mL/min   GFR calc Af Amer 14 (L) >60 mL/min    Comment: (NOTE) The eGFR has been calculated using the CKD EPI equation. This calculation has not been validated in all clinical situations. eGFR's persistently <60 mL/min signify possible Chronic Kidney Disease.    Anion gap 9 5 - 15  Glucose, capillary     Status: Abnormal   Collection Time: 07/25/17  7:27 AM  Result Value Ref Range   Glucose-Capillary 124 (H) 65 - 99 mg/dL  Glucose, capillary     Status: Abnormal  Collection Time: 07/25/17 11:27 AM  Result Value Ref Range   Glucose-Capillary 158 (H) 65 - 99 mg/dL   Dg Chest 2 View  Result Date: 07/24/2017 CLINICAL DATA:  Renal failure without dialysis. Fluid retention and left flank pain. EXAM: CHEST  2 VIEW COMPARISON:  06/18/2016 FINDINGS: The heart size and mediastinal contours are within normal limits. Both lungs are clear. The  visualized skeletal structures are unremarkable. IMPRESSION: No active cardiopulmonary disease. Electronically Signed   By: Ashley Royalty M.D.   On: 07/24/2017 23:00    ROS: all other systems are reviewed and are negative except as per HPI  Blood pressure (!) 121/55, pulse 83, temperature 98.5 F (36.9 C), temperature source Oral, resp. rate 18, height _0  (1.651 m), weight 104 kg (229 lb 4.5 oz), SpO2 99 %. Physical Exam  GEN NAD, sitting in bed HEENT EOMI, PERRL NECK no JVD PULM clear CV RRR ABD soft, nontedner EXT no LE edema NEURO AAO x 3 SKIN no rashes  Assessment/Plan  1.  Rapidly progressive renal failure: need to get a biopsy to determine if there is anything reversible.  If so, will pursue treatment and if not will prepare for dialysis.  I'll vein map and see if she can get a fistula placed here during this admission.    2.  Flank pain: will do a renal US to ensure no obstruction  3.  DM II: historically poor control  4.  HTN: OK for now  5.  Anemia: s/p pRBCs.  Needs ESA.  Have ordered.  6.  CAD: off ASA/Plavix.  Will restart ASAP after procedures.   Madelon Lips, MD [facility] pgr [phone] 07/25/2017, 2:17 PM

## 2017-07-25 NOTE — ED Notes (Signed)
Pt resting comfortably in bed, no suspected reaction. Pt denies itching, VSS.

## 2017-07-25 NOTE — Progress Notes (Addendum)
Inpatient Diabetes Program Recommendations  AACE/ADA: New Consensus Statement on Inpatient Glycemic Control (2015)  Target Ranges:  Prepandial:   less than 140 mg/dL      Peak postprandial:   less than 180 mg/dL (1-2 hours)      Critically ill patients:  140 - 180 mg/dL   Lab Results  Component Value Date   GLUCAP 124 (H) 07/25/2017   HGBA1C 7.3 06/21/2017    Review of Glycemic Control  Results for ANARA, COWMAN (MRN [id number]) as of 07/25/2017 11:11  Ref. Range 07/24/2017 16:05 07/24/2017 16:24 07/24/2017 16:49 07/25/2017 02:27 07/25/2017 07:27  Glucose-Capillary Latest Ref Range: 65 - 99 mg/dL 44 (LL) 77 133 (H) 197 (H) 124 (H)    Diabetes history: Type 2 Outpatient Diabetes medications: NPH 70/30 16 units bid Current orders for Inpatient glycemic control: Lantus 10 units qhs, Novolog 3 units tid, Novolog 0-9 units tid  Inpatient Diabetes Program Recommendations: Agree with current medications for blood sugar management.     Gentry Fitz, RN, BA, MHA, CDE Diabetes Coordinator Inpatient Diabetes Program  [phone] (Team Pager) [phone] ([place]) 07/25/2017 11:20 AM

## 2017-07-25 NOTE — Consult Note (Signed)
Chief Complaint: Patient was seen in consultation today for random renal biopsy at the request of Dr Laurena Bering  Referring Physician(s): Dr Laurena Bering  Supervising Physician: Corrie Mckusick  Patient Status: [facility] - In-pt  History of Present Illness: Jane [name] is a 41 y.o. female   DM; HTN; CAG Progressive renal failure Flank pain Pt has been scheduled for OP renal biopsy more than once Has been rescheduled for UTI then not npo Now inpt and worsening renal failure Dr Hollie Salk requesting Inpt random renal biopsy   Past Medical History:  Diagnosis Date  . Asthma    prn inhaler  . CAD S/P BMS PCI to prox LAD 03/31/2016   Ost LAD to Mid LAD lesion, 90% stenosed. Post intervention - Vision BMS 3.0 mm x 18 mm (~3.5 mm) there is a 0% residual stenosis.   . Charcot foot due to diabetes mellitus ([place]) 11/2016   left  . Depression   . Diabetic neuropathy (HCC)    feet  . H/O non-ST elevation myocardial infarction (NSTEMI) 03/2016   Found in 9% mid LAD lesion treated with bare-metal stent (BMS) PCI - vision BMS 3.0 mm x 18 mm  . History of MRSA infection   . Hyperlipidemia   . Hypertension    medication dose increased 11/22/2015; has been taking med. consistently since 03/2016, per pt.  . Insulin dependent diabetes mellitus ([place])   . Iron deficiency anemia    takes iron supplement  . Retinopathy of both eyes   . Sickle cell trait ([place])   . 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 ACHILLES TENDON LENGTHENING Left 11/24/2016   Procedure: Left Achilles tendon lengthening (open);  Surgeon: Wylene Simmer, MD;  Location: [place];  Service: Orthopedics;  Laterality: Left;  . CALCANEAL OSTEOTOMY Left 11/24/2016   Procedure: Left hindfoot osteotomy and fusion;  Surgeon: Wylene Simmer, MD;  Location: [place];  Service: Orthopedics;  Laterality: Left;  . CARDIAC CATHETERIZATION N/A 03/31/2016   Procedure: Left Heart Cath and Coronary Angiography;  Surgeon: Lorretta Harp, MD;  Location: [facility] INVASIVE CV LAB: 90% early mLAD, normal LV Fxn  . CARDIAC CATHETERIZATION N/A 03/31/2016   Procedure: Coronary Stent Intervention;  Surgeon: Lorretta Harp, MD;  Location: [place] CV LAB;  Service: Cardiovascular: PCI to mLAD BMS Vision 3.0 mm x 18 mm  . CESAREAN SECTION  1997  . TRANSTHORACIC ECHOCARDIOGRAM  03/2016   Normal LV size and thickness. EF 60-65%. GR 1 DD. Otherwise essentially normal.  . TUBAL LIGATION  1997    Allergies: Adhesive [tape]  Medications: Prior to Admission medications   Medication Sig Start Date End Date Taking? Authorizing Provider  acetaminophen (TYLENOL) 325 MG tablet Take 650 mg by mouth every 6 (six) hours as needed for mild pain.    Yes [provider]  atorvastatin (LIPITOR) 40 MG tablet Take 1 tablet (40 mg total) by mouth daily. 06/26/17  Yes Alphonzo Grieve, MD  carvedilol (COREG) 25 MG tablet Take 1 tablet (25 mg total) by mouth 2 (two) times daily with a meal. 06/26/17  Yes Alphonzo Grieve, MD  diltiazem (CARDIZEM CD) 240 MG 24 hr capsule Take 1 capsule (240 mg total) by mouth daily. 06/26/17  Yes Alphonzo Grieve, MD  ferrous sulfate 325 (65 FE) MG tablet TAKE 1 Tablet BY MOUTH EVERY MORNING WITH BREAKFAST 10/04/16  Yes Alphonzo Grieve, MD  furosemide (LASIX) 40  MG tablet Take 1 tablet (40 mg total) by mouth daily. 06/23/17  Yes Svalina, Gorica, MD  insulin NPH-regular Human (HUMULIN 70/30) (70-30) 100 UNIT/ML injection Inject 16 Units into the skin 2 (two) times daily with a meal. 02/24/17  Yes Rivet, Carly J, MD  megestrol (MEGACE) 40 MG tablet Take 1 tablet (40 mg total) by mouth daily. Internal Medicine Program 06/27/17  Yes Harraway-Smith, Carolyn, MD  ondansetron (ZOFRAN) 4 MG tablet Take 1 tablet (4 mg total) by mouth every 8 (eight) hours as needed for nausea. 07/10/17 07/10/18 Yes Rathore, Vasundhra, MD  PROVENTIL HFA 108 (90 Base) MCG/ACT  inhaler INHALE 2 PUFFS BY MOUTH EVERY 4 HOURS AS NEEDED FOR COUGHING, WHEEZING, OR SHORTNESS OF BREATH 03/24/17  Yes Svalina, Gorica, MD  ACCU-CHEK FASTCLIX LANCETS MISC Check blood sugar 3x a day, diag code E11.3493, insulin dependent 06/27/17   Svalina, Gorica, MD  Blood Glucose Monitoring Suppl (ACCU-CHEK GUIDE) w/Device KIT 1 each by Does not apply route 3 (three) times daily. 05/18/17   Svalina, Gorica, MD  clopidogrel (PLAVIX) 75 MG tablet TAKE 1 TABLET BY MOUTH DAILY. 05/24/17   Svalina, Gorica, MD  glucose blood (ACCU-CHEK GUIDE) test strip Check blood sugar 3x a day  as instructed 06/27/17   Svalina, Gorica, MD  Insulin Pen Needle (B-D UF III MINI PEN NEEDLES) 31G X 5 MM MISC Use for injection of [place] as instructed. 03/30/17   Svalina, Gorica, MD  Insulin Syringe-Needle U-100 31G X 15/64" 0.3 ML MISC Use to inject insulin two times a day 02/24/17   Rivet, Carly J, MD  liraglutide (VICTOZA) 18 MG/3ML SOPN Inject 0.2 mLs (1.2 mg total) into the skin daily. Patient not taking: Reported on 07/24/2017 06/22/17   Svalina, Gorica, MD     Family History  Problem Relation Age of Onset  . Stroke Mother   . Diabetes Mother   . Hypertension Mother   . Aneurysm Mother   . Diabetes Father   . Hypertension Father   . Diabetes Sister   . Hypertension Sister   . Diabetes Maternal Grandmother   . Diabetes Maternal Grandfather   . Cancer Paternal Grandfather        Prostate    Social History   Social History  . Marital status: Single    Spouse name: N/A  . Number of children: 2  . Years of education: 13   Occupational History  .   [employer]   Social History Main Topics  . Smoking status: Former Smoker    Packs/day: 0.00    Years: 0.00    Quit date: 08/29/2015  . Smokeless tobacco: Never Used  . Alcohol use No  . Drug use: No  . Sexual activity: Yes    Birth control/ protection: Surgical   Other Topics Concern  . None   Social History Narrative   Patient does not drink  caffeine.   Patient is right handed.     Review of Systems: A 12 point ROS discussed and pertinent positives are indicated in the HPI above.  All other systems are negative.  Review of Systems  Constitutional: Positive for activity change and fatigue. Negative for appetite change and fever.  Respiratory: Positive for shortness of breath.   Cardiovascular: Negative for chest pain.  Gastrointestinal: Negative for abdominal pain.  Genitourinary: Positive for flank pain.  Neurological: Positive for weakness.  Psychiatric/Behavioral: Negative for behavioral problems and confusion.    Vital Signs: BP (!) 121/55 (BP Location: Right Arm)     Pulse 83   Temp 98.5 F (36.9 C) (Oral)   Resp 18   Ht 5' 5" (1.651 m)   Wt 229 lb 4.5 oz (104 kg)   LMP  (LMP Unknown)   SpO2 99%   BMI 38.15 kg/m   Physical Exam  Constitutional: She is oriented to person, place, and time.  Cardiovascular: Normal rate, regular rhythm and normal heart sounds.   Pulmonary/Chest: Effort normal.  Abdominal: Soft. Bowel sounds are normal.  Musculoskeletal: Normal range of motion.  Neurological: She is alert and oriented to person, place, and time.  Skin: Skin is warm and dry.  Psychiatric: She has a normal mood and affect. Her behavior is normal. Judgment and thought content normal.  Nursing note and vitals reviewed.   Mallampati Score:  MD Evaluation Airway: WNL Heart: WNL Abdomen: WNL Chest/ Lungs: WNL ASA  Classification: 3 Mallampati/Airway Score: One  Imaging: Dg Chest 2 View  Result Date: 07/24/2017 CLINICAL DATA:  Renal failure without dialysis. Fluid retention and left flank pain. EXAM: CHEST  2 VIEW COMPARISON:  06/18/2016 FINDINGS: The heart size and mediastinal contours are within normal limits. Both lungs are clear. The visualized skeletal structures are unremarkable. IMPRESSION: No active cardiopulmonary disease. Electronically Signed   By: Ashley Royalty M.D.   On: 07/24/2017 23:00   Mm  Digital Screening Bilateral  Result Date: 07/11/2017 CLINICAL DATA:  Screening. EXAM: DIGITAL SCREENING BILATERAL MAMMOGRAM WITH CAD COMPARISON:  None. ACR Breast Density Category b: There are scattered areas of fibroglandular density. FINDINGS: There are no findings suspicious for malignancy. Images were processed with CAD. IMPRESSION: No mammographic evidence of malignancy. A result letter of this screening mammogram will be mailed directly to the patient. RECOMMENDATION: Screening mammogram in one year. (Code:SM-B-01Y) BI-RADS CATEGORY  1: Negative. Electronically Signed   By: Dorise Bullion III M.D   On: 07/11/2017 10:56    Labs:  CBC:  Recent Labs  11/24/16 0703 07/24/17 1300 07/25/17 0537  WBC  --  6.4 7.2  HGB 10.4* 7.5* 9.1*  HCT  --  24.4* 28.5*  PLT  --  233 241    COAGS: No results for input(s): INR, APTT in the last 8760 hours.  BMP:  Recent Labs  04/13/17 1351 06/28/17 0940 07/24/17 1300 07/25/17 0537  NA 135 142 138 139  K 3.9 4.4 4.4 4.0  CL 107 108* 108 109  CO2 21* 18* 23 21*  GLUCOSE 124* 146* 132* 139*  BUN 38* 64* 38* 36*  CALCIUM 8.2* 8.4* 8.5* 8.5*  CREATININE 3.40* 5.37* 4.63* 4.32*  GFRNONAA 16* 9* 11* 12*  GFRAA 18* 11* 12* 14*    LIVER FUNCTION TESTS:  Recent Labs  03/03/17 0850 06/28/17 0940 07/24/17 1300  BILITOT 0.4  --  0.4  AST 19  --  22  ALT 14  --  18  ALKPHOS 81  --  78  PROT 5.4*  --  5.9*  ALBUMIN 2.8* 3.2* 3.0*    TUMOR MARKERS: No results for input(s): AFPTM, CEA, CA199, CHROMGRNA in the last 8760 hours.  Assessment and Plan:  Rapidly progressive renal failure Scheduled for random renal biopsy in IR Risks and benefits discussed with the patient including, but not limited to bleeding, infection, damage to adjacent structures or low yield requiring additional tests. All of the patient's questions were answered, patient is agreeable to proceed. Consent signed and in chart.  Thank you for this interesting  consult.  I greatly enjoyed meeting [name] and  look forward to participating in their care.  A copy of this report was sent to the requesting provider on this date.  Electronically Signed: Lavonia Drafts, PA-C 07/25/2017, 2:42 PM   I spent a total of 20 Minutes    in face to face in clinical consultation, greater than 50% of which was counseling/coordinating care for random renal biopsy

## 2017-07-25 NOTE — Plan of Care (Signed)
Problem: Fluid Volume: Goal: Ability to maintain a balanced intake and output will improve Outcome: Progressing Patient aware of fluid restriction and is compliant. Discussed how many cups of fluid that she could drink in order to remain in the 1500cc fluid restriction limit.

## 2017-07-25 NOTE — Progress Notes (Signed)
New Admission Note:  Arrival Method: Via stretcher from ED. Mental Orientation: Alert & Oriented x4 Telemetry: N/A Assessment: Completed Skin: Refer to flowsheet IV: Right AC Pain: 0/10 Tubes: None Safety Measures: Safety Fall Prevention Plan discussed with patient. Admission: Completed 2 [place] Orientation: Patient has been orientated to the room, unit and the staff.  Orders have been reviewed and implemented. Will continue to monitor the patient. Call light has been placed within reach.   Vassie Moselle, RN  Phone Number: [phone]

## 2017-07-25 NOTE — Progress Notes (Signed)
Attempted to get report from nurse. X1.

## 2017-07-25 NOTE — Progress Notes (Signed)
   Subjective: Patient was resting comfortably in her bed upon entering the room today. She was about to start breakfast when we began our evaluation. She had several questions pertaining to her care which were answered to her satisfaction. Patient was in agreement with the plan to treat her and to have nephrology stop by. Patient denied chest pain, abdominal pain, nausea, vomiting, headache, diarrhea, fever, weakness, or other concerning symptoms. Patient stated that she felt somewhat better as compared to yesterday, slept well overnight.  Objective:  Vital signs in last 24 hours: Vitals:   07/25/17 0301 07/25/17 0309 07/25/17 0517 07/25/17 0954  BP: (!) 181/87 (!) 165/75 (!) 160/71 (!) 121/55  Pulse: 91  83 83  Resp: 18  17 18   Temp: 99.3 F (37.4 C)  99.1 F (37.3 C) 98.5 F (36.9 C)  TempSrc: Oral   Oral  SpO2: 100%  100% 99%  Weight:      Height:       ROS negative except as per HPI.  Physical Exam  Constitutional: She is oriented to person, place, and time. She appears well-developed and well-nourished. No distress.  HENT:  Head: Normocephalic and atraumatic.  Neck: Normal range of motion.  Cardiovascular: Normal rate and regular rhythm.   No murmur heard. Pulmonary/Chest: Effort normal and breath sounds normal. No respiratory distress.  Abdominal: Soft. Bowel sounds are normal. She exhibits no distension. There is no tenderness.  Musculoskeletal: She exhibits no edema or tenderness.  Neurological: She is alert and oriented to person, place, and time.  Skin: Skin is warm. She is not diaphoretic.  Psychiatric: She has a normal mood and affect.     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 -Iron studies considered, Ferritin 106,   ESRD: Preparing patient for eventual dialysis -Nephrology consulted, Called to notify us that they will see the patient today. Thank you -Nephrology considering biopsy as an inpatient? -Awaiting nephrology recommendations  Diabetes Mellitus: -Insulin aspart sliding scale (sensitive) -Insulin aspart injection 3 units with meals TID -Insulin Lanutus 10 units at bedtime -Glucose range-100-200 today  Diet: carb modified Fluids: 67ml/hr DVT ppx: SCD's Code: Full Dispo: Anticipated discharge in approximately 1-2 day(s).   Kathi Ludwig, MD 07/25/2017, 10:16 AM Pager: Pager# [phone]

## 2017-07-26 ENCOUNTER — Inpatient Hospital Stay (HOSPITAL_COMMUNITY): Payer: Medicaid Other

## 2017-07-26 ENCOUNTER — Encounter: Payer: Self-pay | Admitting: Internal Medicine

## 2017-07-26 DIAGNOSIS — E1140 Type 2 diabetes mellitus with diabetic neuropathy, unspecified: Secondary | ICD-10-CM

## 2017-07-26 DIAGNOSIS — E11649 Type 2 diabetes mellitus with hypoglycemia without coma: Secondary | ICD-10-CM

## 2017-07-26 DIAGNOSIS — N179 Acute kidney failure, unspecified: Secondary | ICD-10-CM

## 2017-07-26 DIAGNOSIS — D573 Sickle-cell trait: Secondary | ICD-10-CM

## 2017-07-26 DIAGNOSIS — E11319 Type 2 diabetes mellitus with unspecified diabetic retinopathy without macular edema: Secondary | ICD-10-CM

## 2017-07-26 DIAGNOSIS — Z8614 Personal history of Methicillin resistant Staphylococcus aureus infection: Secondary | ICD-10-CM

## 2017-07-26 LAB — CBC
HCT: 27.2 % — ABNORMAL LOW (ref 36.0–46.0)
Hemoglobin: 8.7 g/dL — ABNORMAL LOW (ref 12.0–15.0)
MCH: 26.8 pg (ref 26.0–34.0)
MCHC: 32 g/dL (ref 30.0–36.0)
MCV: 83.7 fL (ref 78.0–100.0)
PLATELETS: 226 10*3/uL (ref 150–400)
RBC: 3.25 MIL/uL — AB (ref 3.87–5.11)
RDW: 13.6 % (ref 11.5–15.5)
WBC: 5.8 10*3/uL (ref 4.0–10.5)

## 2017-07-26 LAB — PROTIME-INR
INR: 1.13
PROTHROMBIN TIME: 14.4 s (ref 11.4–15.2)

## 2017-07-26 LAB — GLUCOSE, CAPILLARY
GLUCOSE-CAPILLARY: 135 mg/dL — AB (ref 65–99)
Glucose-Capillary: 116 mg/dL — ABNORMAL HIGH (ref 65–99)
Glucose-Capillary: 246 mg/dL — ABNORMAL HIGH (ref 65–99)

## 2017-07-26 LAB — APTT: aPTT: 29 s (ref 24–36)

## 2017-07-26 MED ORDER — MIDAZOLAM HCL 2 MG/2ML IJ SOLN
INTRAMUSCULAR | Status: AC
  Filled 2017-07-26: qty 2

## 2017-07-26 MED ORDER — MIDAZOLAM HCL 2 MG/2ML IJ SOLN
INTRAMUSCULAR | Status: AC | PRN
  Administered 2017-07-26: 1 mg via INTRAVENOUS
  Administered 2017-07-26: 0.5 mg via INTRAVENOUS

## 2017-07-26 MED ORDER — SODIUM CHLORIDE 0.9 % IV SOLN
INTRAVENOUS | Status: AC | PRN
  Administered 2017-07-26: 10 mL/h via INTRAVENOUS

## 2017-07-26 MED ORDER — FENTANYL CITRATE (PF) 100 MCG/2ML IJ SOLN
INTRAMUSCULAR | Status: AC
  Filled 2017-07-26: qty 2

## 2017-07-26 MED ORDER — LIDOCAINE HCL (PF) 1 % IJ SOLN
INTRAMUSCULAR | Status: AC
  Filled 2017-07-26: qty 30

## 2017-07-26 MED ORDER — FENTANYL CITRATE (PF) 100 MCG/2ML IJ SOLN
INTRAMUSCULAR | Status: AC | PRN
  Administered 2017-07-26: 50 ug via INTRAVENOUS
  Administered 2017-07-26: 25 ug via INTRAVENOUS

## 2017-07-26 MED ORDER — GELATIN ABSORBABLE 12-7 MM EX MISC
CUTANEOUS | Status: AC
  Filled 2017-07-26: qty 1

## 2017-07-26 NOTE — Procedures (Signed)
Interventional Radiology Procedure Note  Procedure: US guided kidney bx.  Medical renal.  Left kidney.  Complications: None Recommendations:  - 3 hours bedrest - Do not submerge for 7 days - Routine wound care - primary team may order diet.  VIR ok to advance diet   Signed,  Dulcy Fanny. Earleen Newport, DO

## 2017-07-26 NOTE — Progress Notes (Signed)
  [facility] Progress Note    Assessment/ Plan:    1.  Rapidly progressive renal failure: need to get a biopsy to determine if there is anything reversible.  If so, will pursue treatment and if not will prepare for dialysis.  I'll vein map and see if she can get a fistula placed here during this admission.  NPO for biopsy today.  2.  Flank pain: will do a renal US to ensure no obstruction- can be done right before biopsy.  3.  DM II: historically poor control  4.  HTN: OK for now  5.  Anemia: s/p pRBCs.  Needs ESA.  Have ordered.  6.  CAD: off ASA/Plavix.  Will restart ASAP after procedures.  Subjective:   For biopsy. Feeling OK this AM.     Objective:   BP (!) 144/76   Pulse 87   Temp 98 F (36.7 C) (Oral)   Resp 17   Ht 5\' 5"  (1.651 m)   Wt 104 kg (229 lb 4.5 oz)   LMP  (LMP Unknown)   SpO2 99%   BMI 38.15 kg/m   Intake/Output Summary (Last 24 hours) at 07/26/17 1914 Last data filed at 07/26/17 0601  Gross per 24 hour  Intake              360 ml  Output              825 ml  Net             -465 ml   Weight change:   Physical Exam: GEN NAD, sitting in bed HEENT EOMI, PERRL NECK no JVD PULM clear CV RRR ABD soft, nontedner EXT no LE edema NEURO AAO x 3 SKIN no rashes  Imaging: Dg Chest 2 View  Result Date: 07/24/2017 CLINICAL DATA:  Renal failure without dialysis. Fluid retention and left flank pain. EXAM: CHEST  2 VIEW COMPARISON:  06/18/2016 FINDINGS: The heart size and mediastinal contours are within normal limits. Both lungs are clear. The visualized skeletal structures are unremarkable. IMPRESSION: No active cardiopulmonary disease. Electronically Signed   By: Ashley Royalty M.D.   On: 07/24/2017 23:00    Labs: BMET  Recent Labs Lab 07/24/17 1300 07/25/17 0537  NA 138 139  K 4.4 4.0  CL 108 109  CO2 23 21*  GLUCOSE 132* 139*  BUN 38* 36*  CREATININE 4.63* 4.32*  CALCIUM 8.5* 8.5*   CBC  Recent Labs Lab  07/24/17 1300 07/25/17 0537 07/26/17 0421  WBC 6.4 7.2 5.8  HGB 7.5* 9.1* 8.7*  HCT 24.4* 28.5* 27.2*  MCV 83.6 84.6 83.7  PLT 233 241 226    Medications:    . carvedilol  25 mg Oral BID WC  . darbepoetin (ARANESP) injection - NON-DIALYSIS  100 mcg Subcutaneous Q Tue-1800  . diltiazem  240 mg Oral Daily  . furosemide  40 mg Oral Daily  . insulin aspart  0-9 Units Subcutaneous TID WC  . insulin aspart  3 Units Subcutaneous TID WC  . insulin glargine  10 Units Subcutaneous QHS  . megestrol  40 mg Oral Daily      Madelon Lips, MD [place]  pgr [phone] 07/26/2017, 9:39 AM

## 2017-07-26 NOTE — Progress Notes (Signed)
   Subjective: Patient was resting comfortably in bed today upon entering the room. She stated that she was hungry and anxious to complete the procedure so she could eat. Patient was aware of the plan for today, denied concerns or questions. She was informed that plans for vein mapping with eventual fistula placement were in order as well as the biopsy. She denied, chest pain, abdominal pain, nausea, vomiting, diarrhea, constipation, headache, visual changes, cough, fever or chills. Patient stated that she understood the purpose and need for the procedures today.   Objective:  Vital signs in last 24 hours: Vitals:   07/25/17 0954 07/25/17 1718 07/25/17 2059 07/26/17 0445  BP: (!) 121/55 137/62 (!) 141/64 (!) 154/79  Pulse: 83 82 81 81  Resp: 18 18 15 16   Temp: 98.5 F (36.9 C) 99.1 F (37.3 C) 98.8 F (37.1 C) 98.9 F (37.2 C)  TempSrc: Oral Oral    SpO2: 99% 98% 100% 100%  Weight:      Height:       ROS negative except as per HPI.  Physical Exam  Constitutional: She appears well-developed and well-nourished. No distress.  HENT:  Head: Normocephalic and atraumatic.  Eyes: EOM are normal.  Neck: Normal range of motion.  Cardiovascular: Normal rate and regular rhythm.   No murmur heard. Pulmonary/Chest: Effort normal and breath sounds normal. No respiratory distress.  Abdominal: Soft. Bowel sounds are normal. She exhibits no distension.  Musculoskeletal: She exhibits no edema.  Neurological: She is alert.  Skin: Skin is warm. Capillary refill takes less than 2 seconds. She is not diaphoretic.    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   ESRD: Preparing patient for eventual dialysis -Nephrology requesting biopsy, IR visited patient, awaiting procedure -Nephrology ordering vein mapping for fistula placement as per their note -Awaiting further nephrology recommendations -U/S ordered for flank pain by nephrology  Diabetes Mellitus: -Insulin aspart sliding scale (sensitive) -Insulin aspart injection 3 units with meals TID -Insulin Lanutus 10 units at bedtime -Glucose range-100-160 today  Hypertension: -Patient hypertensive today -Will consider restarting antihypertensives   Diet: carb modified following procedure Fluids: 44ml/hr DVT ppx: SCD's Code: Full Dispo: Anticipated discharge in approximately 2 day(s).   Kathi Ludwig, MD 07/26/2017, 7:26 AM Pager: Pager# [phone]

## 2017-07-27 ENCOUNTER — Other Ambulatory Visit: Payer: Self-pay | Admitting: Physician Assistant

## 2017-07-27 ENCOUNTER — Encounter (HOSPITAL_COMMUNITY): Payer: Self-pay

## 2017-07-27 ENCOUNTER — Other Ambulatory Visit: Payer: Self-pay | Admitting: Student

## 2017-07-27 ENCOUNTER — Ambulatory Visit: Payer: Self-pay | Admitting: Obstetrics & Gynecology

## 2017-07-27 ENCOUNTER — Ambulatory Visit (HOSPITAL_COMMUNITY): Payer: Medicaid Other

## 2017-07-27 LAB — GLUCOSE, CAPILLARY
GLUCOSE-CAPILLARY: 124 mg/dL — AB (ref 65–99)
Glucose-Capillary: 183 mg/dL — ABNORMAL HIGH (ref 65–99)
Glucose-Capillary: 175 mg/dL — ABNORMAL HIGH (ref 65–99)
Glucose-Capillary: 193 mg/dL — ABNORMAL HIGH (ref 65–99)

## 2017-07-27 LAB — RENAL FUNCTION PANEL
ANION GAP: 8 (ref 5–15)
Albumin: 2.9 g/dL — ABNORMAL LOW (ref 3.5–5.0)
Albumin: 2.9 g/dL — ABNORMAL LOW (ref 3.5–5.0)
Anion gap: 8 (ref 5–15)
BUN: 41 mg/dL — ABNORMAL HIGH (ref 6–20)
BUN: 42 mg/dL — AB (ref 6–20)
CHLORIDE: 109 mmol/L (ref 101–111)
CHLORIDE: 106 mmol/L (ref 101–111)
CO2: 21 mmol/L — ABNORMAL LOW (ref 22–32)
CO2: 24 mmol/L (ref 22–32)
CREATININE: 4.74 mg/dL — AB (ref 0.44–1.00)
CREATININE: 5.05 mg/dL — AB (ref 0.44–1.00)
Calcium: 8.7 mg/dL — ABNORMAL LOW (ref 8.9–10.3)
Calcium: 8.5 mg/dL — ABNORMAL LOW (ref 8.9–10.3)
GFR calc Af Amer: 11 mL/min — ABNORMAL LOW (ref >60)
GFR calc non Af Amer: 10 mL/min — ABNORMAL LOW (ref >60)
GFR, EST AFRICAN AMERICAN: 12 mL/min — AB (ref >60)
GFR, EST NON AFRICAN AMERICAN: 10 mL/min — AB (ref >60)
GLUCOSE: 178 mg/dL — AB (ref 65–99)
Glucose, Bld: 156 mg/dL — ABNORMAL HIGH (ref 65–99)
POTASSIUM: 4.8 mmol/L (ref 3.5–5.1)
Phosphorus: 4.8 mg/dL — ABNORMAL HIGH (ref 2.5–4.6)
Phosphorus: 4.9 mg/dL — ABNORMAL HIGH (ref 2.5–4.6)
Potassium: 4.7 mmol/L (ref 3.5–5.1)
Sodium: 138 mmol/L (ref 135–145)
Sodium: 138 mmol/L (ref 135–145)

## 2017-07-27 LAB — CBC
HEMATOCRIT: 27.5 % — AB (ref 36.0–46.0)
HEMATOCRIT: 28.7 % — AB (ref 36.0–46.0)
HEMOGLOBIN: 9.2 g/dL — AB (ref 12.0–15.0)
Hemoglobin: 8.7 g/dL — ABNORMAL LOW (ref 12.0–15.0)
MCH: 26.6 pg (ref 26.0–34.0)
MCH: 26.9 pg (ref 26.0–34.0)
MCHC: 31.6 g/dL (ref 30.0–36.0)
MCHC: 32.1 g/dL (ref 30.0–36.0)
MCV: 84.1 fL (ref 78.0–100.0)
MCV: 83.9 fL (ref 78.0–100.0)
PLATELETS: 238 10*3/uL (ref 150–400)
Platelets: 228 10*3/uL (ref 150–400)
RBC: 3.27 MIL/uL — ABNORMAL LOW (ref 3.87–5.11)
RBC: 3.42 MIL/uL — ABNORMAL LOW (ref 3.87–5.11)
RDW: 13.7 % (ref 11.5–15.5)
RDW: 13.8 % (ref 11.5–15.5)
WBC: 5.8 10*3/uL (ref 4.0–10.5)
WBC: 6.4 10*3/uL (ref 4.0–10.5)

## 2017-07-27 NOTE — Progress Notes (Signed)
   Subjective: the patient was resting comfortably upon entering the room. She stated that the nephrologist had already stopped by today to discuss her potential need for a fistula and biopsy report from [facility]. Patient denied chest pain, SOB, headache, nausea, vomiting, diarrhea, fatigue, fever or chills. Patient in agreement with plan to continue her care with regard to pursuing fistular placement.   Objective:  Vital signs in last 24 hours: Vitals:   07/26/17 1719 07/26/17 2139 07/27/17 0445 07/27/17 1000  BP: (!) 165/67 127/68 139/70 137/72  Pulse: 85 80 84 88  Resp: 18 18 17 18   Temp: 99.2 F (37.3 C) 99 F (37.2 C) 98.7 F (37.1 C) 98.2 F (36.8 C)  TempSrc: Oral Oral Oral Oral  SpO2: 100% 98% 100% 100%  Weight:  227 lb 11.2 oz (103.3 kg)    Height:       ROS negative except as per HPI.  Physical Exam  Constitutional: She is oriented to person, place, and time. She appears well-developed and well-nourished.  HENT:  Head: Normocephalic and atraumatic.  Cardiovascular: Normal rate and regular rhythm.   Pulmonary/Chest: Effort normal and breath sounds normal. No respiratory distress.  Abdominal: Soft. Bowel sounds are normal. She exhibits no distension. There is no tenderness.  Musculoskeletal: She exhibits no edema.  Neurological: She is alert and oriented to person, place, and time.  Skin: Skin is warm. She is not diaphoretic.  Psychiatric: She has a normal mood and affect.   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  -Darbepoetin alfa 154mcg daily by nephrology ordered, we appreciate nephrology placing this order  ESRD: Preparing patient for eventual dialysis -Vein mapping for eventual fistula placement -Nephrology considering Fistular placement today, will determine if outpatient/inpatient is needed -Awaiting further nephrology recommendations  Diabetes Mellitus: -Insulin aspart sliding scale (sensitive) -Insulin aspart injection 3 units with meals TID -Insulin Lanutus 10 units at bedtime -Glucose range-77-246 over 24/hrs  Hypertension: -Patient hypertensive today -Furosemide 40mg  daily,  -Carvedilol 25mg  BID -diltiazem 24hr 240mg  daily  Diet: carb modified following procedure Fluids: none DVT ppx: SCD's Code: Full Dispo: Anticipated discharge in approximately 0-1 day(s).   Kathi Ludwig, MD 07/27/2017, 11:11 AM Pager: Pager# [phone]

## 2017-07-27 NOTE — Discharge Summary (Signed)
Name: Jane [name] MRN: [id number] DOB: 11-13-1976 41 y.o. 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, with long-term current use of insulin ([place])   Hyperlipidemia associated with type 2 diabetes mellitus ([place])   Essential hypertension   CAD S/P BMS PCI to prox LAD   Symptomatic anemia  Discharge Medications: Allergies as of 08/01/2017      Reactions   Adhesive [tape] Other (See Comments)   Irritation      Medication List    STOP taking these medications   clopidogrel 75 MG tablet Commonly known as:  PLAVIX   insulin NPH-regular Human (70-30) 100 UNIT/ML injection Commonly known as:  HUMULIN 70/30   Insulin Pen Needle 31G X 5 MM Misc Commonly known as:  B-D UF III MINI PEN NEEDLES   liraglutide 18 MG/3ML Sopn Commonly known as:  VICTOZA   ondansetron 4 MG tablet Commonly known as:  ZOFRAN     TAKE these medications   ACCU-CHEK FASTCLIX LANCETS Misc Check blood sugar 3x a day, diag code H47.4259, insulin dependent   ACCU-CHEK GUIDE w/Device Kit 1 each by Does not apply route 3 (three) times daily.   acetaminophen 325 MG tablet Commonly known as:  TYLENOL Take 650 mg by mouth every 6 (six) hours as needed for mild pain.   atorvastatin 40 MG tablet Commonly known as:  LIPITOR Take 1 tablet (40 mg total) by mouth daily.   carvedilol 25 MG tablet Commonly known as:  COREG Take 1 tablet (25 mg total) by mouth 2 (two) times daily with a meal.   diltiazem 240 MG 24 hr capsule Commonly known as:  CARDIZEM CD Take 1 capsule (240 mg total) by mouth daily.   ferrous sulfate 325 (65 FE) MG tablet TAKE 1 Tablet BY MOUTH EVERY MORNING WITH BREAKFAST   furosemide 40 MG tablet Commonly known as:  LASIX Take 1 tablet (40 mg total) by mouth daily.   glucose  blood test strip Commonly known as:  ACCU-CHEK GUIDE Check blood sugar 3x a day  as instructed   insulin aspart 100 UNIT/ML injection Commonly known as:  novoLOG Inject 6 Units into the skin 3 (three) times daily with meals.   insulin glargine 100 UNIT/ML injection Commonly known as:  LANTUS Inject 0.12 mLs (12 Units total) into the skin at bedtime.   Insulin Syringe-Needle U-100 31G X 15/64" 0.3 ML Misc Use to inject insulin three times a day What changed:  additional instructions   megestrol 40 MG tablet Commonly known as:  MEGACE Take 1 tablet (40 mg total) by mouth daily. Internal Medicine Program   mupirocin ointment 2 % Commonly known as:  BACTROBAN Place 1 application into the nose 2 (two) times daily.   PROVENTIL HFA 108 (90 Base) MCG/ACT inhaler Generic drug:  albuterol INHALE 2 PUFFS BY MOUTH EVERY 4 HOURS AS NEEDED FOR COUGHING, WHEEZING, OR SHORTNESS OF BREATH            Discharge Care Instructions        Start     Ordered   08/01/17 0000  insulin glargine (LANTUS) 100 UNIT/ML injection  Daily at bedtime     08/01/17 1118   08/01/17 0000  insulin aspart (NOVOLOG) 100 UNIT/ML injection  3 times daily with meals     08/01/17 1118  08/01/17 0000  mupirocin ointment (BACTROBAN) 2 %  2 times daily     08/01/17 1118   08/01/17 0000  Increase activity slowly     08/01/17 1118   08/01/17 0000  Diet - low sodium heart healthy     08/01/17 1118   08/01/17 0000  Call MD for:  persistant nausea and vomiting     08/01/17 1118   08/01/17 0000  Call MD for:  difficulty breathing, headache or visual disturbances     08/01/17 1118   08/01/17 0000  Call MD for:  persistant dizziness or light-headedness     08/01/17 1118   08/01/17 0000  Insulin Syringe-Needle U-100 31G X 15/64" 0.3 ML MISC     08/01/17 1118      Disposition and follow-up:   [name] was discharged from [facility] in Stable condition.  At the hospital follow up  visit please address:  1.  A. Anemia      B. Hypertension      C. End Stage Renal Disease      D. Diabetes management, please monitor, please readdress her insulin regimen     2.  Labs / imaging needed at time of follow-up: CBG  3.  Pending labs/ test needing follow-up: RENAL BIOPSY results  Follow-up Appointments: Follow-up Information    Madelon Lips, MD Follow up on 08/03/2017.   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 Diabetes Mellitus:  The patient was admitted with a known history of diabetes mellitus, previous A1c was 7.3 on 06/21/2017. Her blood glucose levels ranged from 116-193 over the last 24 hours a for admission. She was discharged home on Lantus 12 units daily at bedtime and NovoLog 6 units 3 times a day with meals. The patient was told to start taking her liraglutide medication as this was most likely contributing to her episodes of hypoglycemia. Multiple diabetes medication regimens were discussed with the patient who ultimately preferred Lantus and NovoLog as identified above. Please readdress her insulin regimen while reviewing a weak's worth of CBG recordings at her next office visit.  2. Essential hypertension: Patient's carvedilol, Cardizem, and furosemide, were initially held and administered as needed as patient presented with hypotension initially. She was discharged home on her previous home antihypertensive medication regimen which was progressively implemented during her stay as her pressure increased.   3. 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 Her Plavix was held since 07/11/2017 pending renal biopsy. She was advised to please continue to hold her plavix until she could meet with her nephrologist. Patient was advised to continue her atorvastatin on discharge.  4. CKDIV-V: (ESRD) The patient was seen and evaluated by nephrology throughout her stay. Nephrology requested that IR perform a renal biopsy which was sent to [facility] with results pending. Vein mapping was completed followed by fistula construction. The patient underwent fistula placement by vascular surgery for impending hemodialysis given her renal function.   5. Symptomatic anemia: Patient initially presented with symptomatic anemia with Hgb of 7.5 that was  rapidly falling. As such, she was transfused one unit of PRBC's with symptomatic improvement. She was placed on Darbepoetin Alfa by nephrology for anemia secondary to chronic kidney disease. Patients Hgb stabilized remaining near 9g/dL throughout the remainder of her stay. She was discharged home on her home dose of megace as well for vaginal bleeding as this appears to continue to control her previous heavy vaginal blood loss. Please follow-up with Hgb as an outpatient to continue to monitor her anemia.   As always the patient was advised to return if she developed symptoms similar to those prompting her current admission. Symptoms such as lightheadedness, fatigue, weakness, nausea and vomiting, or severe hyperglycemia, chest pain, or other concerning symptoms.  Discharge Vitals:   BP (!) 158/82   Pulse 78   Temp 98.4 F (36.9 C) (Oral)   Resp 20   Ht '5\' 5"'$  (1.651 m)   Wt 229 lb 11.5 oz (104.2 kg)   SpO2 98%   BMI 38.23 kg/m   Pertinent Labs, Studies, and Procedures:  CBC Latest Ref Rng & Units 07/29/2017 07/27/2017 07/27/2017  WBC 4.0 - 10.5 K/uL 7.1 6.4 5.8  Hemoglobin 12.0 - 15.0 g/dL 9.0(L) 9.2(L) 8.7(L)  Hematocrit 36.0 - 46.0 % 28.3(L) 28.7(L) 27.5(L)  Platelets 150 - 400 K/uL 236 228 238   CMP Latest  Ref Rng & Units 08/01/2017 07/29/2017 07/27/2017  Glucose 65 - 99 mg/dL 193(H) 163(H) 178(H)  BUN 6 - 20 mg/dL 59(H) 54(H) 42(H)  Creatinine 0.44 - 1.00 mg/dL 5.75(H) 5.41(H) 5.05(H)  Sodium 135 - 145 mmol/L 138 140 138  Potassium 3.5 - 5.1 mmol/L 4.8 4.8 4.8  Chloride 101 - 111 mmol/L 107 109 106  CO2 22 - 32 mmol/L '23 24 24  '$ Calcium 8.9 - 10.3 mg/dL 8.8(L) 8.7(L) 8.5(L)  Total Protein 6.5 - 8.1 g/dL - - -  Total Bilirubin 0.3 - 1.2 mg/dL - - -  Alkaline Phos 38 - 126 U/L - - -  AST 15 - 41 U/L - - -  ALT 14 - 54 U/L - - -    Discharge Instructions: Discharge Instructions    Call MD for:  difficulty breathing, headache or visual disturbances    Complete by:  As directed    Call MD for:  persistant dizziness or light-headedness    Complete by:  As directed    Call MD for:  persistant nausea and vomiting    Complete by:  As directed    Diet - low sodium heart healthy    Complete by:  As directed    Increase activity slowly    Complete by:  As directed       Signed: Kathi Ludwig, MD 08/03/2017, 3:55 PM   Pager: Pager# [phone]

## 2017-07-27 NOTE — Progress Notes (Signed)
Reported off to [facility] and bedside rounding done

## 2017-07-27 NOTE — Progress Notes (Signed)
[facility] Progress Note    Assessment/ Plan:    1.  Rapidly progressive renal failure: s/p renal biopsy 9/12, awaiting results.  Vein mapping ordered, will see if she can get a fistula placed here during this admission if Cr doesn't improve further like it already has.  2.  Flank pain: no obstruction or stones on [place]-- ? MSK from lifting heavy boot for Charcot foot  3.  DM II: historically poor control  4.  HTN: OK for now  5.  Anemia: s/p pRBCs.  Needs ESA.  Have ordered.  6.  CAD: off ASA/Plavix.  Will restart ASAP after procedures- hasn't been 24 hours yet.  Subjective:    S/p renal biopsy.   Feeling OK.  Results pending.   Objective:   BP 139/70 (BP Location: Left Arm)   Pulse 84   Temp 98.7 F (37.1 C) (Oral)   Resp 17   Ht 5\' 5"  (1.651 m)   Wt 103.3 kg (227 lb 11.2 oz)   LMP  (LMP Unknown)   SpO2 100%   BMI 37.89 kg/m   Intake/Output Summary (Last 24 hours) at 07/27/17 0942 Last data filed at 07/27/17 0800  Gross per 24 hour  Intake              240 ml  Output             1200 ml  Net             -960 ml   Weight change:   Physical Exam: GEN NAD, sitting in bed HEENT EOMI, PERRL NECK no JVD PULM clear CV RRR ABD soft, nontender, biopsy site c/d/i without bruising.   EXT no LE edema NEURO AAO x 3 SKIN no rashes  Imaging: US Renal  Result Date: 07/26/2017 CLINICAL DATA:  Acute kidney injury EXAM: RENAL / URINARY TRACT ULTRASOUND COMPLETE COMPARISON:  None. FINDINGS: Right Kidney: Length: 11.2 cm. Echogenicity within normal limits. No mass or hydronephrosis visualized. Left Kidney: Length: 11.3 cm. Echogenicity within normal limits. No mass or hydronephrosis visualized. Bladder: Appears normal for degree of bladder distention. IMPRESSION: Unremarkable renal ultrasound. Electronically Signed   By: Rolm Baptise M.D.   On: 07/26/2017 12:40   US Biopsy (kidney)  Result Date: 07/27/2017 INDICATION: 41 year old female with a history of  chronic renal insufficiency. EXAM: ULTRASOUND GUIDED MEDICAL RENAL BIOPSY MEDICATIONS: None. ANESTHESIA/SEDATION: Moderate (conscious) sedation was employed during this procedure. A total of Versed 1.5 mg and Fentanyl 75 mcg was administered intravenously. Moderate Sedation Time: 13 minutes. The patient's level of consciousness and vital signs were monitored continuously by radiology nursing throughout the procedure under my direct supervision. FLUOROSCOPY TIME:  None COMPLICATIONS: None PROCEDURE: Informed written consent was obtained from the patient after a thorough discussion of the procedural risks, benefits and alternatives. All questions were addressed. Maximal Sterile Barrier Technique was utilized including caps, mask, sterile gowns, sterile gloves, sterile drape, hand hygiene and skin antiseptic. A timeout was performed prior to the initiation of the procedure. Patient was positioned prone position on the gantry table. Images were stored sent to PACs. Once the patient is prepped and draped in the usual sterile fashion, the skin and subcutaneous tissues overlying the left kidney were generously infiltrated 1% lidocaine for local anesthesia. Using ultrasound guidance, a 15 gauge guide needle was advanced into the lower cortex of the left kidney. Once we confirmed location of the needle tip, 2 separate 16 gauge core biopsy were achieved. Two Gel-Foam pledgets were  infused with a small amount of saline. The needle was removed. Final images were stored. The patient tolerated the procedure well and remained hemodynamically stable throughout. No complications were encountered and no significant blood loss encountered. IMPRESSION: Status post ultrasound-guided medical renal biopsy. Tissue specimen sent to pathology for complete histopathologic analysis. Signed, Dulcy Fanny. Earleen Newport, DO [facility] Electronically Signed   By: Corrie Mckusick D.O.   On: 07/27/2017  07:52    Labs: BMET  Recent Labs Lab 07/24/17 1300 07/25/17 0537  NA 138 139  K 4.4 4.0  CL 108 109  CO2 23 21*  GLUCOSE 132* 139*  BUN 38* 36*  CREATININE 4.63* 4.32*  CALCIUM 8.5* 8.5*   CBC  Recent Labs Lab 07/24/17 1300 07/25/17 0537 07/26/17 0421 07/27/17 0252  WBC 6.4 7.2 5.8 5.8  HGB 7.5* 9.1* 8.7* 8.7*  HCT 24.4* 28.5* 27.2* 27.5*  MCV 83.6 84.6 83.7 84.1  PLT 233 241 226 238    Medications:    . carvedilol  25 mg Oral BID WC  . darbepoetin (ARANESP) injection - NON-DIALYSIS  100 mcg Subcutaneous Q Tue-1800  . diltiazem  240 mg Oral Daily  . furosemide  40 mg Oral Daily  . insulin aspart  0-9 Units Subcutaneous TID WC  . insulin aspart  3 Units Subcutaneous TID WC  . insulin glargine  10 Units Subcutaneous QHS  . megestrol  40 mg Oral Daily      Madelon Lips, MD [place]  pgr [phone] 07/27/2017, 9:42 AM

## 2017-07-27 NOTE — Plan of Care (Signed)
Problem: Education: Goal: Knowledge of [facility] Education information/materials will improve Outcome: Progressing Will have to receive general information re health like proper handwashing and using PPE as needed  Problem: Safety: Goal: Ability to remain free from injury will improve Outcome: Progressing Will have call light within her reach and bed alarm if it calls for  Problem: Health Behavior/Discharge Planning: Goal: Ability to manage health-related needs will improve Outcome: Progressing Monitor labs and record any meals taken by the pt. Reporting any unusualities    Problem: Pain Managment: Goal: General experience of comfort will improve Outcome: Progressing Attend to calls with adl's on a timely fashion manner  Problem: Physical Regulation: Goal: Ability to maintain clinical measurements within normal limits will improve Outcome: Progressing Monitor vitals, clinical labs etc

## 2017-07-28 ENCOUNTER — Encounter (HOSPITAL_COMMUNITY): Payer: Self-pay

## 2017-07-28 ENCOUNTER — Ambulatory Visit (HOSPITAL_COMMUNITY)
Admission: RE | Admit: 2017-07-28 | Discharge: 2017-07-28 | Disposition: A | Discharge status: Home / Self Care | Payer: Medicaid Other | Source: Ambulatory Visit | Attending: Nephrology | Admitting: Nephrology

## 2017-07-28 ENCOUNTER — Inpatient Hospital Stay (HOSPITAL_COMMUNITY): Payer: Medicaid Other

## 2017-07-28 DIAGNOSIS — N019 Rapidly progressive nephritic syndrome with unspecified morphologic changes: Secondary | ICD-10-CM

## 2017-07-28 DIAGNOSIS — N186 End stage renal disease: Secondary | ICD-10-CM

## 2017-07-28 DIAGNOSIS — N185 Chronic kidney disease, stage 5: Secondary | ICD-10-CM

## 2017-07-28 DIAGNOSIS — N19 Unspecified kidney failure: Secondary | ICD-10-CM

## 2017-07-28 LAB — TYPE AND SCREEN
ABO/RH(D): O POS
ANTIBODY SCREEN: NEGATIVE
Unit division: 0
Unit division: 0

## 2017-07-28 LAB — GLUCOSE, CAPILLARY
GLUCOSE-CAPILLARY: 169 mg/dL — AB (ref 65–99)
Glucose-Capillary: 116 mg/dL — ABNORMAL HIGH (ref 65–99)
Glucose-Capillary: 158 mg/dL — ABNORMAL HIGH (ref 65–99)
Glucose-Capillary: 217 mg/dL — ABNORMAL HIGH (ref 65–99)

## 2017-07-28 LAB — BPAM RBC
BLOOD PRODUCT EXPIRATION DATE: 201810082359
Blood Product Expiration Date: 201810082359
ISSUE DATE / TIME: 201809110031
UNIT TYPE AND RH: 5100
UNIT TYPE AND RH: 5100

## 2017-07-28 NOTE — Consult Note (Signed)
Hospital Consult    Reason for Consult:  In need of HD access Requesting Physician:  Hollie Salk MRN #:  [id number]  History of Present Illness: This is a 41 y.o. female with a hx of CKD that was first seen by nephrology in August 2018.  She had a renal bx on 07/26/17 per [facility] nephropathology and the results are pending.    She called the nephrologist with c/o N/V, metallic taste and flank pain.  She was instructed to go to the ED.  She has rapidly progressive renal failure.  She states that a couple of weeks ago, she was at a football game and drank water and Gatorade but did not void for over 8 hours.  She states that she just has not felt good.    She is in need of permanent HD access and [facility] is consulted.    She does have hx of diabetes and is on insulin.  She is on a statin for cholesterol management.   She has hx of CAD with ostial LAD to mid LAD lesion that a stent was placed in 2017 and she is on Plavix.  Her aspirin and Plavix are being held at this time.  She is on a beta blocker and CCB for blood pressure management.  She has remote tobacco hx as she quit in 2016.  Past Medical History:  Diagnosis Date  . Asthma    prn inhaler  . CAD S/P BMS PCI to prox LAD 03/31/2016   Ost LAD to Mid LAD lesion, 90% stenosed. Post intervention - Vision BMS 3.0 mm x 18 mm (~3.5 mm) there is a 0% residual stenosis.   . Charcot foot due to diabetes mellitus ([place]) 11/2016   left  . Depression   . Diabetic neuropathy (HCC)    feet  . H/O non-ST elevation myocardial infarction (NSTEMI) 03/2016   Found in 9% mid LAD lesion treated with bare-metal stent (BMS) PCI - vision BMS 3.0 mm x 18 mm  . History of MRSA infection   . Hyperlipidemia   . Hypertension    medication dose increased 11/22/2015; has been taking med. consistently since 03/2016, per pt.  . Insulin dependent diabetes mellitus ([place])   . Iron deficiency anemia    takes iron supplement  . Retinopathy of both eyes   . Sickle cell trait ([place])     . 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 ACHILLES TENDON LENGTHENING Left 11/24/2016   Procedure: Left Achilles tendon lengthening (open);  Surgeon: Wylene Simmer, MD;  Location: [place];  Service: Orthopedics;  Laterality: Left;  . CALCANEAL OSTEOTOMY Left 11/24/2016   Procedure: Left hindfoot osteotomy and fusion;  Surgeon: Wylene Simmer, MD;  Location: [place];  Service: Orthopedics;  Laterality: Left;  . CARDIAC CATHETERIZATION N/A 03/31/2016   Procedure: Left Heart Cath and Coronary Angiography;  Surgeon: Lorretta Harp, MD;  Location: [facility] INVASIVE CV LAB: 90% Corrinna Karapetyan mLAD, normal LV Fxn  . CARDIAC CATHETERIZATION N/A 03/31/2016   Procedure: Coronary Stent Intervention;  Surgeon: Lorretta Harp, MD;  Location: [place] CV LAB;  Service: Cardiovascular: PCI to mLAD BMS Vision 3.0 mm x 18 mm  . CESAREAN SECTION  1997  . TRANSTHORACIC ECHOCARDIOGRAM  03/2016   Normal LV size and thickness. EF 60-65%. GR 1 DD. Otherwise essentially normal.  . TUBAL LIGATION  1997    Allergies  Allergen Reactions  . Adhesive [Tape]  Other (See Comments)    Irritation     Prior to Admission medications   Medication Sig Start Date End Date Taking? Authorizing Provider  acetaminophen (TYLENOL) 325 MG tablet Take 650 mg by mouth every 6 (six) hours as needed for mild pain.    Yes [provider]  atorvastatin (LIPITOR) 40 MG tablet Take 1 tablet (40 mg total) by mouth daily. 06/26/17  Yes Alphonzo Grieve, MD  carvedilol (COREG) 25 MG tablet Take 1 tablet (25 mg total) by mouth 2 (two) times daily with a meal. 06/26/17  Yes Alphonzo Grieve, MD  diltiazem (CARDIZEM CD) 240 MG 24 hr capsule Take 1 capsule (240 mg total) by mouth daily. 06/26/17  Yes Alphonzo Grieve, MD  ferrous sulfate 325 (65 FE) MG tablet TAKE 1 Tablet BY MOUTH EVERY MORNING WITH BREAKFAST 10/04/16  Yes Alphonzo Grieve, MD  furosemide (LASIX)  40 MG tablet Take 1 tablet (40 mg total) by mouth daily. 06/23/17  Yes Alphonzo Grieve, MD  insulin NPH-regular Human (HUMULIN 70/30) (70-30) 100 UNIT/ML injection Inject 16 Units into the skin 2 (two) times daily with a meal. 02/24/17  Yes Rivet, Carly J, MD  megestrol (MEGACE) 40 MG tablet Take 1 tablet (40 mg total) by mouth daily. Internal [facility] Program 06/27/17  Yes Lavonia Drafts, MD  ondansetron (ZOFRAN) 4 MG tablet Take 1 tablet (4 mg total) by mouth every 8 (eight) hours as needed for nausea. 07/10/17 07/10/18 Yes Shela Leff, MD  PROVENTIL HFA 108 (90 Base) MCG/ACT inhaler INHALE 2 PUFFS BY MOUTH EVERY 4 HOURS AS NEEDED FOR COUGHING, WHEEZING, OR SHORTNESS OF BREATH 03/24/17  Yes Alphonzo Grieve, MD  ACCU-CHEK FASTCLIX LANCETS MISC Check blood sugar 3x a day, diag code J69.6789, insulin dependent 06/27/17   Alphonzo Grieve, MD  Blood Glucose Monitoring Suppl (ACCU-CHEK GUIDE) w/Device KIT 1 each by Does not apply route 3 (three) times daily. 05/18/17   Alphonzo Grieve, MD  clopidogrel (PLAVIX) 75 MG tablet TAKE 1 TABLET BY MOUTH DAILY. 05/24/17   Alphonzo Grieve, MD  glucose blood (ACCU-CHEK GUIDE) test strip Check blood sugar 3x a day  as instructed 06/27/17   Alphonzo Grieve, MD  Insulin Pen Needle (B-D UF III MINI PEN NEEDLES) 31G X 5 MM MISC Use for injection of [place] as instructed. 03/30/17   Alphonzo Grieve, MD  Insulin Syringe-Needle U-100 31G X 15/64" 0.3 ML MISC Use to inject insulin two times a day 02/24/17   Rivet, Carly J, MD  liraglutide (VICTOZA) 18 MG/3ML SOPN Inject 0.2 mLs (1.2 mg total) into the skin daily. Patient not taking: Reported on 07/24/2017 06/22/17   Alphonzo Grieve, MD    Social History   Social History  . Marital status: Single    Spouse name: N/A  . Number of children: 2  . Years of education: 13   Occupational History  .   [employer]   Social History Main Topics  . Smoking status: Former Smoker    Packs/day: 0.00    Years: 0.00     Quit date: 08/29/2015  . Smokeless tobacco: Never Used  . Alcohol use No  . Drug use: No  . Sexual activity: Yes    Birth control/ protection: Surgical   Other Topics Concern  . Not on file   Social History Narrative   Patient does not drink caffeine.   Patient is right handed.      Family History  Problem Relation Age of Onset  . Stroke Mother   .  Diabetes Mother   . Hypertension Mother   . Aneurysm Mother   . Diabetes Father   . Hypertension Father   . Diabetes Sister   . Hypertension Sister   . Diabetes Maternal Grandmother   . Diabetes Maternal Grandfather   . Cancer Paternal Grandfather        Prostate    ROS: '[x]'$  Positive   '[ ]'$  Negative   '[ ]'$  All sytems reviewed and are negative  Cardiac: '[]'$  chest pain/pressure '[]'$  palpitations '[]'$  SOB lying flat '[]'$  DOE  Vascular: '[]'$  pain in legs while walking '[]'$  pain in legs at rest '[]'$  pain in legs at night '[]'$  non-healing ulcers '[]'$  hx of DVT '[]'$  swelling in legs  Pulmonary: '[]'$  productive cough '[]'$  asthma/wheezing '[]'$  home O2 '[x]'$  asthma  Neurologic: '[]'$  weakness in '[]'$  arms '[]'$  legs '[]'$  numbness in '[]'$  arms '[]'$  legs '[]'$  hx of CVA '[]'$  mini stroke '[]'$ difficulty speaking or slurred speech '[]'$  temporary loss of vision in one eye '[]'$  dizziness  Hematologic: '[]'$  hx of cancer '[]'$  bleeding problems '[]'$  problems with blood clotting easily '[x]'$  sickle cell trait  Endocrine:   '[x]'$  diabetes '[]'$  thyroid disease  GI '[]'$  vomiting blood '[]'$  blood in stool '[x]'$  N/V  GU: '[x]'$  CKD/renal failure '[]'$  HD--'[]'$  M/W/F or '[]'$  T/T/S '[]'$  burning with urination '[]'$  blood in urine  Psychiatric: '[]'$  anxiety '[x]'$  depression  Musculoskeletal: '[]'$  arthritis '[]'$  joint pain  Integumentary: '[]'$  rashes '[]'$  ulcers  Constitutional: '[]'$  fever '[]'$  chills '[x]'$  N/V   Physical Examination  Vitals:   07/28/17 0458 07/28/17 0944  BP: 135/77 (!) 146/67  Pulse: 80 87  Resp: 18 17  Temp: 98.6 F (37 C) 98.6 F (37 C)  SpO2: 100% 100%   Body mass index is  38.27 kg/m.  General:  WDWN in NAD Gait: Not observed HENT: WNL, normocephalic Pulmonary: normal non-labored breathing, without Rales, rhonchi,  wheezing Cardiac: regular, without  Murmurs, rubs or gallops; without carotid bruits Abdomen:  soft, NT/ND, no masses Skin: without rashes Vascular Exam/Pulses:  Right Left  Radial 2+ (normal) 2+ (normal)  Ulnar 2+ (normal) 2+ (normal)  DP 2+ (normal) 2+ (normal)  PT 2+ (normal) 2+ (normal)   Extremities: without ischemic changes, without Gangrene , without cellulitis; without open wounds; Charcot foot left; IV in right antecubital space Musculoskeletal: no muscle wasting or atrophy  Neurologic: A&O X 3;  No focal weakness or paresthesias are detected; speech is fluent/normal Psychiatric:  The pt has Normal affect.   CBC    Component Value Date/Time   WBC 6.4 07/27/2017 1031   RBC 3.42 (L) 07/27/2017 1031   HGB 9.2 (L) 07/27/2017 1031   HGB 9.9 (L) 04/08/2016 1211   HCT 28.7 (L) 07/27/2017 1031   HCT 31.2 (L) 04/08/2016 1211   PLT 228 07/27/2017 1031   PLT 303 04/08/2016 1211   MCV 83.9 07/27/2017 1031   MCV 81 04/08/2016 1211   MCH 26.9 07/27/2017 1031   MCHC 32.1 07/27/2017 1031   RDW 13.8 07/27/2017 1031   RDW 16.6 (H) 04/08/2016 1211   LYMPHSABS 2.0 01/03/2016 0400   MONOABS 0.5 01/03/2016 0400   EOSABS 0.2 01/03/2016 0400   BASOSABS 0.0 01/03/2016 0400    BMET    Component Value Date/Time   NA 138 07/27/2017 1640   NA 142 06/28/2017 0940   K 4.8 07/27/2017 1640   CL 106 07/27/2017 1640   CO2 24 07/27/2017 1640   GLUCOSE 178 (H) 07/27/2017 1640   BUN  42 (H) 07/27/2017 1640   BUN 64 (H) 06/28/2017 0940   CREATININE 5.05 (H) 07/27/2017 1640   CREATININE 3.40 (H) 03/27/2017 0759   CALCIUM 8.5 (L) 07/27/2017 1640   GFRNONAA 10 (L) 07/27/2017 1640   GFRNONAA 65 06/09/2015 1211   GFRAA 11 (L) 07/27/2017 1640   GFRAA 75 06/09/2015 1211    COAGS: Lab Results  Component Value Date   INR 1.13 07/26/2017   INR  1.07 03/31/2016   INR 1.0 01/11/2009     Non-Invasive Vascular Imaging:   Vein mapping pending  Statin:  Yes.   Beta Blocker:  Yes.   Aspirin:  Yes.   ACEI:  No. ARB:  No. CCB use:  Yes Other antiplatelets/anticoagulants:  Yes.   Plavix   ASSESSMENT/PLAN: This is a 41 y.o. female with rapidly progressive renal failure in need of permanent dialysis access.   -pt is right hand dominant.   -vein mapping is pending-will make further plan once this is completed.  -if pt is to be discharged, this can be set up on an outpatient basis.    Leontine Locket, PA-C [facility] [phone]  I have examined the patient, reviewed and agree with above. Vein map is reviewed. This does show extremely small cephalic vein throughout the arm on the left. Her cephalic vein in the upper arm is better caliber on the right greater than 3 mm throughout its course. Will discontinue her right IV today and place a saline lock block in her left arm. Plan right arm AV fistula on Monday  Curt Jews, MD 07/28/2017 6:39 PM

## 2017-07-28 NOTE — Progress Notes (Signed)
Covering provider notified that EKG did not cross over into system & that EKG placed in chart.

## 2017-07-28 NOTE — Progress Notes (Signed)
Left Upper Extremity Vein Map    Cephalic  Segment Diameter Depth Comment  1. Axilla mm mm Not visualized  2. Mid upper arm 0.76mm 6.8mm   3. Above AC 1.65mm 5.27mm   4. In AC mm mm Not visualized  5. Below AC 2.98mm 1.49mm   6. Mid forearm 2.10mm 3.18mm   7. Wrist 5.5HR 4.1UL    Basilic  Segment Diameter Depth Comment  2. Mid upper arm 2.90mm 24.80mm   3. Above AC 2.77mm 22.81mm   4. In [facility] 3.43mm 2.33mm   5. Below AC 2.27mm 4.18mm   6. Mid forearm mm mm Not visualized  7. Wrist mm mm Not visualized   Toma Copier, RVS 07/28/2017 4:39 PM

## 2017-07-28 NOTE — Progress Notes (Signed)
[facility] Progress Note    Assessment/ Plan:    1.  Rapidly progressive renal failure: s/p renal biopsy 9/12, awaiting results, per [facility]- closed today, won't be back until Monday.  Vein mapping ordered, will see if she can get a fistula placed here during this admission.  2.  Flank pain: no obstruction or stones on [place]-- ? MSK from lifting heavy boot for Charcot foot  3.  DM II: historically poor control  4.  HTN: OK for now  5.  Anemia: s/p pRBCs.  Aranesp 100 mcg q Tuesday ordered.    6.  CAD: off ASA/Plavix.  Will restart ASAP after procedures  Subjective:    Vein mapping ordered for today.  No complaints.     Objective:   BP (!) 146/67 (BP Location: Left Arm)   Pulse 87   Temp 98.6 F (37 C) (Oral)   Resp 17   Ht 5\' 5"  (1.651 m)   Wt 104.3 kg (230 lb)   SpO2 100%   BMI 38.27 kg/m   Intake/Output Summary (Last 24 hours) at 07/28/17 1110 Last data filed at 07/28/17 0458  Gross per 24 hour  Intake              360 ml  Output              950 ml  Net             -590 ml   Weight change: 1.043 kg (2 lb 4.8 oz)  Physical Exam: GEN NAD, sitting in bed HEENT EOMI, PERRL NECK no JVD PULM clear CV RRR ABD soft, nontender, biopsy site c/d/i without bruising.   EXT no LE edema NEURO AAO x 3 SKIN no rashes  Imaging: US Renal  Result Date: 07/26/2017 CLINICAL DATA:  Acute kidney injury EXAM: RENAL / URINARY TRACT ULTRASOUND COMPLETE COMPARISON:  None. FINDINGS: Right Kidney: Length: 11.2 cm. Echogenicity within normal limits. No mass or hydronephrosis visualized. Left Kidney: Length: 11.3 cm. Echogenicity within normal limits. No mass or hydronephrosis visualized. Bladder: Appears normal for degree of bladder distention. IMPRESSION: Unremarkable renal ultrasound. Electronically Signed   By: Rolm Baptise M.D.   On: 07/26/2017 12:40   US Biopsy (kidney)  Result Date: 07/27/2017 INDICATION: 41 year old female with a history of  chronic renal insufficiency. EXAM: ULTRASOUND GUIDED MEDICAL RENAL BIOPSY MEDICATIONS: None. ANESTHESIA/SEDATION: Moderate (conscious) sedation was employed during this procedure. A total of Versed 1.5 mg and Fentanyl 75 mcg was administered intravenously. Moderate Sedation Time: 13 minutes. The patient's level of consciousness and vital signs were monitored continuously by radiology nursing throughout the procedure under my direct supervision. FLUOROSCOPY TIME:  None COMPLICATIONS: None PROCEDURE: Informed written consent was obtained from the patient after a thorough discussion of the procedural risks, benefits and alternatives. All questions were addressed. Maximal Sterile Barrier Technique was utilized including caps, mask, sterile gowns, sterile gloves, sterile drape, hand hygiene and skin antiseptic. A timeout was performed prior to the initiation of the procedure. Patient was positioned prone position on the gantry table. Images were stored sent to PACs. Once the patient is prepped and draped in the usual sterile fashion, the skin and subcutaneous tissues overlying the left kidney were generously infiltrated 1% lidocaine for local anesthesia. Using ultrasound guidance, a 15 gauge guide needle was advanced into the lower cortex of the left kidney. Once we confirmed location of the needle tip, 2 separate 16 gauge core biopsy were achieved. Two Gel-Foam pledgets were infused with  a small amount of saline. The needle was removed. Final images were stored. The patient tolerated the procedure well and remained hemodynamically stable throughout. No complications were encountered and no significant blood loss encountered. IMPRESSION: Status post ultrasound-guided medical renal biopsy. Tissue specimen sent to pathology for complete histopathologic analysis. Signed, Dulcy Fanny. Earleen Newport, DO [name] Electronically Signed   By: Corrie Mckusick D.O.   On: 07/27/2017  07:52    Labs: BMET  Recent Labs Lab 07/24/17 1300 07/25/17 0537 07/27/17 0909 07/27/17 1640  NA 138 139 138 138  K 4.4 4.0 4.7 4.8  CL 108 109 109 106  CO2 23 21* 21* 24  GLUCOSE 132* 139* 156* 178*  BUN 38* 36* 41* 42*  CREATININE 4.63* 4.32* 4.74* 5.05*  CALCIUM 8.5* 8.5* 8.7* 8.5*  PHOS  --   --  4.8* 4.9*   CBC  Recent Labs Lab 07/25/17 0537 07/26/17 0421 07/27/17 0252 07/27/17 1031  WBC 7.2 5.8 5.8 6.4  HGB 9.1* 8.7* 8.7* 9.2*  HCT 28.5* 27.2* 27.5* 28.7*  MCV 84.6 83.7 84.1 83.9  PLT 241 226 238 228    Medications:    . carvedilol  25 mg Oral BID WC  . darbepoetin (ARANESP) injection - NON-DIALYSIS  100 mcg Subcutaneous Q Tue-1800  . diltiazem  240 mg Oral Daily  . furosemide  40 mg Oral Daily  . insulin aspart  0-9 Units Subcutaneous TID WC  . insulin aspart  3 Units Subcutaneous TID WC  . insulin glargine  10 Units Subcutaneous QHS  . megestrol  40 mg Oral Daily      Madelon Lips, MD [place]  pgr [phone] 07/28/2017, 11:10 AM

## 2017-07-28 NOTE — Progress Notes (Signed)
   Subjective: The patient was lying her bed watching the news concerning Hurricaine Florence upon entering in the room. The patient stated that she slept well overnight and denied concerns. Patient denied headache, nausea, vomiting, visual changes, chest pain, abdominal pain, fever, muscle aches, or chills. She was aware that she was awaiting the vein mapping for possible fistula placement as well as the biopsy report from [facility]. Patient is being followed by Dr.  Hollie Salk who has been keeping her up-to-date on her progress. Patient denies additional questions or concerns today.  Objective:  Vital signs in last 24 hours: Vitals:   07/27/17 1000 07/27/17 1722 07/27/17 2213 07/28/17 0458  BP: 137/72 127/72 133/69 135/77  Pulse: 88 82 83 80  Resp: 18 18 18 18   Temp: 98.2 F (36.8 C) 98 F (36.7 C) 99.2 F (37.3 C) 98.6 F (37 C)  TempSrc: Oral Oral Oral Oral  SpO2: 100% 100% 100% 100%  Weight:   230 lb (104.3 kg)   Height:   5\' 5"  (1.651 m)    ROS negative except as per HPI.  Physical Exam  Constitutional: She appears well-nourished.  HENT:  Head: Normocephalic and atraumatic.  Cardiovascular: Normal rate and regular rhythm.   No murmur heard. Pulmonary/Chest: Effort normal and breath sounds normal.  Abdominal: Soft. Bowel sounds are normal. There is no tenderness.  Neurological: She is alert.  Skin: Skin is warm. She is not diaphoretic.  Psychiatric: She has a normal mood and affect.    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  -Darbepoetin alfa 133mcg by nephrology ordered,  we appreciate nephrology placing this order -Megace for history of vaginal bleeding  ESRD: Preparing patient for eventual dialysis -Vein mapping for eventual fistula placement ordered, please complete -Nephrology considering Fistular placement today, will determine if outpatient/inpatient is needed -Awaiting further nephrology recommendations and orders  Diabetes Mellitus: -Insulin aspart sliding scale (sensitive) -Insulin aspart injection 3 units with meals TID -Insulin Lanutus 10 units at bedtime -Glucose range-124-193 yesterday  Hypertension: -Patient normotensive today -Furosemide 40mg  daily,  -Carvedilol 25mg  BID -diltiazem 24hr 240mg  daily  Diet: carb modified  Fluids: none DVT ppx: SCD's Code: Full Dispo: Anticipated discharge in approximately 1-2 day(s).   Kathi Ludwig, MD 07/28/2017, 6:57 AM Pager: Pager# [phone]

## 2017-07-28 NOTE — Progress Notes (Signed)
MD notified pt c/o CP that lasted <10 sec's. CP free at this time. Informed MD this RN will obtain EKG. VSS. No new orders received. Will continue to monitor.

## 2017-07-28 NOTE — Progress Notes (Signed)
Right  Upper Extremity Vein Map    Cephalic  Segment Diameter Depth Comment  1. Proximal Upper 4.59mm 14.22mm   2. Mid upper arm 3.6mm 36mm   3. Above AC 3.37mm 5.59mm   4. In AC mm mm Not visualized due to IV  5. Below AC 2.31mm 8.27mm   6. Mid forearm 25mm 5.54mm   7. Wrist 1.1NB 5.6PO    Basilic  Segment Diameter Depth Comment  2. Mid upper arm mm mm Not visulaized  3. Above [facility] 1.62mm 11.36mm   4. In [facility] 1.9mm 5.41mm   5. Below AC 1.70mm 3.50mm Multiple branches  6. Mid forearm mm mm Not visualized  7. Wrist mm mm Not visualized   Toma Copier, RVS 07/28/2017 4:30 PM

## 2017-07-28 NOTE — Progress Notes (Signed)
MD to bedside to evaluate c/o CP. EKG machine not working; tech to borrow machine from another unit and obtain EKG. Pt remains CP free. Will continue to monitor.

## 2017-07-29 LAB — CBC
HCT: 28.3 % — ABNORMAL LOW (ref 36.0–46.0)
Hemoglobin: 9 g/dL — ABNORMAL LOW (ref 12.0–15.0)
MCH: 26.9 pg (ref 26.0–34.0)
MCHC: 31.8 g/dL (ref 30.0–36.0)
MCV: 84.5 fL (ref 78.0–100.0)
PLATELETS: 236 10*3/uL (ref 150–400)
RBC: 3.35 MIL/uL — ABNORMAL LOW (ref 3.87–5.11)
RDW: 14.3 % (ref 11.5–15.5)
WBC: 7.1 10*3/uL (ref 4.0–10.5)

## 2017-07-29 LAB — BASIC METABOLIC PANEL
Anion gap: 7 (ref 5–15)
BUN: 54 mg/dL — AB (ref 6–20)
CALCIUM: 8.7 mg/dL — AB (ref 8.9–10.3)
CO2: 24 mmol/L (ref 22–32)
Chloride: 109 mmol/L (ref 101–111)
Creatinine, Ser: 5.41 mg/dL — ABNORMAL HIGH (ref 0.44–1.00)
GFR calc Af Amer: 10 mL/min — ABNORMAL LOW (ref >60)
GFR, EST NON AFRICAN AMERICAN: 9 mL/min — AB (ref >60)
Glucose, Bld: 163 mg/dL — ABNORMAL HIGH (ref 65–99)
Potassium: 4.8 mmol/L (ref 3.5–5.1)
Sodium: 140 mmol/L (ref 135–145)

## 2017-07-29 LAB — GLUCOSE, CAPILLARY
GLUCOSE-CAPILLARY: 150 mg/dL — AB (ref 65–99)
Glucose-Capillary: 126 mg/dL — ABNORMAL HIGH (ref 65–99)
Glucose-Capillary: 205 mg/dL — ABNORMAL HIGH (ref 65–99)
Glucose-Capillary: 126 mg/dL — ABNORMAL HIGH (ref 65–99)

## 2017-07-29 MED ORDER — HEPARIN SODIUM (PORCINE) 5000 UNIT/ML IJ SOLN
5000.0000 [IU] | Freq: Three times a day (TID) | INTRAMUSCULAR | Status: DC
  Administered 2017-07-29 – 2017-07-30 (×4): 5000 [IU] via SUBCUTANEOUS
  Filled 2017-07-29 (×4): qty 1

## 2017-07-29 NOTE — Plan of Care (Signed)
Problem: Pain Managment: Goal: General experience of comfort will improve Outcome: Progressing Pt given bath this evening along with providing proper room temperature  Problem: Physical Regulation: Goal: Ability to maintain clinical measurements within normal limits will improve Outcome: Progressing On close monitoring with labs and other pertinent clinical measurements Goal: Will remain free from infection Outcome: Progressing On going monitoring for any infection  Problem: Skin Integrity: Goal: Risk for impaired skin integrity will decrease Outcome: Progressing Skin remains intact  Problem: Tissue Perfusion: Goal: Risk factors for ineffective tissue perfusion will decrease Outcome: Progressing Pt utilizing scd at this time  Problem: Activity: Goal: Risk for activity intolerance will decrease Outcome: Progressing Pt utilizing walker with supervision and takes rest periods when experiencing sob  Problem: Fluid Volume: Goal: Ability to maintain a balanced intake and output will improve Outcome: Progressing Pt has no issue with maintaining balance intake and output   Problem: Nutrition: Goal: Adequate nutrition will be maintained Outcome: Progressing Pt has adequate food intake  Problem: Bowel/Gastric: Goal: Will not experience complications related to bowel motility Outcome: Progressing Pt had her bowels moved after adm laxative

## 2017-07-29 NOTE — Progress Notes (Signed)
   Subjective:  Patient was lying comfortably in her bed upon entering the room today. She is denied concerns overnight and admitted that she slept well. Patient is aware of her current progress, and was informed of the results of the vein mapping and plans for fistula placement on Monday. She denied headache, visual changes, chest pain, abdominal pain, fever, muscle aches, or cough.  Objective:  Vital signs in last 24 hours: Vitals:   07/28/17 1937 07/28/17 2241 07/29/17 0410 07/29/17 0905  BP: (!) 149/80  (!) 146/80 135/62  Pulse: 84  87   Resp: 17  16   Temp: 98.7 F (37.1 C)  98.7 F (37.1 C)   TempSrc:      SpO2: 100%  98%   Weight:  226 lb (102.5 kg)    Height:       ROS negative except as per HPI.  Physical Exam  Constitutional: She appears well-developed and well-nourished. No distress.  Cardiovascular: Normal rate and regular rhythm.   No murmur heard. Pulmonary/Chest: Effort normal and breath sounds normal. No respiratory distress.  Abdominal: Soft. Bowel sounds are normal. There is no tenderness.  Musculoskeletal: She exhibits no edema or tenderness.  Neurological: She is alert.  Skin: Skin is warm. She is not diaphoretic.  Psychiatric: She has a normal mood and affect.   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, with long-term current use of insulin (HCC)   Hyperlipidemia associated with type 2 diabetes mellitus ([place])   Essential hypertension   CAD S/P BMS PCI to prox LAD   Symptomatic anemia  Symptomatic Normocytic Anemia: -Hgb stable, 9.0 today -Symptoms improved, patient no longer SOB or as weak -Darbepoetin alfa 12mcg by nephrology ordered, we appreciate nephrology placing this order -Megace for history of vaginal bleeding  ESRD: Preparing patient for eventual dialysis -Vein mapping for eventual fistula placement ordered and finally completed -Nephrology consulted vascular for  Fistular placement today,  -vascular planning on fistular placement on Monday -Awaiting any additional nephrology recommendations and orders  Diabetes Mellitus: -Insulin aspart sliding scale (sensitive) -Insulin aspart injection 3 units with meals TID -Insulin Lanutus 10 units at bedtime -Glucose range-120's-240's today  Hypertension: -Patient normotensive today -Furosemide 40mg  daily,  -Carvedilol 25mg  BID -diltiazem 24hr 240mg  daily  Diet: carb modified  Fluids: none DVT ppx: SCD's  Code: Full Dispo: Anticipated discharge in approximately 2 day(s).   Kathi Ludwig, MD 07/29/2017, 12:59 PM Pager: Pager# [phone]

## 2017-07-29 NOTE — Progress Notes (Signed)
  [facility] Progress Note    Assessment/ Plan:    1.  Rapidly progressive renal failure: s/p renal biopsy 9/12, awaiting results, per [facility]- closed today, won't be back until Monday.  Vein mapping ordered, will see if she can get a fistula placed here during this admission.  2.  Flank pain: no obstruction or stones on [place]-- ? MSK from lifting heavy boot for Charcot foot.  Requesting orthotics c/s  3.  DM II: historically poor control  4.  HTN: OK for now; on Coreg and cardizem.    5.  Anemia: s/p pRBCs.  Aranesp 100 mcg q Tuesday ordered.    6.  CAD: off ASA/Plavix.  Will restart ASAP after procedures  7.  Dispo: I offered her to go home and await biopsy results and do fistula as an outpatient; she prefers to remain here until fistula placement on Monday and biopsy results are back.  Likely home Monday then.   Subjective:    Going to get up and move around today.     Objective:   BP 135/62   Pulse 87   Temp 98.7 F (37.1 C)   Resp 16   Ht 5\' 5"  (1.651 m)   Wt 102.5 kg (226 lb)   SpO2 98%   BMI 37.61 kg/m   Intake/Output Summary (Last 24 hours) at 07/29/17 1253 Last data filed at 07/29/17 0600  Gross per 24 hour  Intake              810 ml  Output             1100 ml  Net             -290 ml   Weight change: -1.814 kg (-4 lb)  Physical Exam: GEN NAD, sitting in bed HEENT EOMI, PERRL NECK no JVD PULM clear CV RRR ABD soft, nontender, biopsy site c/d/i without bruising.   EXT no LE edema NEURO AAO x 3 SKIN no rashes  Imaging: No results found.  Labs: BMET  Recent Labs Lab 07/24/17 1300 07/25/17 0537 07/27/17 0909 07/27/17 1640 07/29/17 0440  NA 138 139 138 138 140  K 4.4 4.0 4.7 4.8 4.8  CL 108 109 109 106 109  CO2 23 21* 21* 24 24  GLUCOSE 132* 139* 156* 178* 163*  BUN 38* 36* 41* 42* 54*  CREATININE 4.63* 4.32* 4.74* 5.05* 5.41*  CALCIUM 8.5* 8.5* 8.7* 8.5* 8.7*  PHOS  --   --  4.8* 4.9*  --     CBC  Recent Labs Lab 07/26/17 0421 07/27/17 0252 07/27/17 1031 07/29/17 0440  WBC 5.8 5.8 6.4 7.1  HGB 8.7* 8.7* 9.2* 9.0*  HCT 27.2* 27.5* 28.7* 28.3*  MCV 83.7 84.1 83.9 84.5  PLT 226 238 228 236    Medications:    . carvedilol  25 mg Oral BID WC  . darbepoetin (ARANESP) injection - NON-DIALYSIS  100 mcg Subcutaneous Q Tue-1800  . diltiazem  240 mg Oral Daily  . furosemide  40 mg Oral Daily  . insulin aspart  0-9 Units Subcutaneous TID WC  . insulin aspart  3 Units Subcutaneous TID WC  . insulin glargine  10 Units Subcutaneous QHS  . megestrol  40 mg Oral Daily      Madelon Lips, MD [facility]  pgr [phone] 07/29/2017, 12:53 PM

## 2017-07-30 LAB — GLUCOSE, CAPILLARY
GLUCOSE-CAPILLARY: 188 mg/dL — AB (ref 65–99)
GLUCOSE-CAPILLARY: 189 mg/dL — AB (ref 65–99)
GLUCOSE-CAPILLARY: 252 mg/dL — AB (ref 65–99)
Glucose-Capillary: 154 mg/dL — ABNORMAL HIGH (ref 65–99)
Glucose-Capillary: 190 mg/dL — ABNORMAL HIGH (ref 65–99)

## 2017-07-30 LAB — SURGICAL PCR SCREEN
MRSA, PCR: POSITIVE — AB
Staphylococcus aureus: POSITIVE — AB

## 2017-07-30 MED ORDER — CHLORHEXIDINE GLUCONATE CLOTH 2 % EX PADS: 6.0000 | MEDICATED_PAD | Freq: Every day | CUTANEOUS | Status: DC

## 2017-07-30 MED ORDER — MUPIROCIN 2 % EX OINT
1.0000 "application " | TOPICAL_OINTMENT | Freq: Two times a day (BID) | CUTANEOUS | Status: DC
  Administered 2017-07-30 – 2017-08-01 (×4): 1 via NASAL
  Filled 2017-07-30: qty 22

## 2017-07-30 NOTE — Progress Notes (Signed)
[facility] Progress Note    Assessment/ Plan:    1.  Rapidly progressive renal failure: Cr 1.4--> 5.37 over the past year.  ANA  + 1:1280, complements WNL, urine sediment active in clinic.  s/p renal biopsy 9/12, awaiting results, per [facility]- results won't be back until Monday.  Vein mapping ordered for fistula placement 9/17 as there is probably a degree of irreversibility.    2.  Flank pain: no obstruction or stones on [place]-- ? MSK from lifting heavy boot for Charcot foot.  Requesting orthotics c/s  3.  DM II: historically poor control  4.  HTN: OK for now; on Coreg and cardizem as well as Lasix.  5.  Anemia: s/p pRBCs.  Aranesp 100 mcg q Tuesday ordered.    6.  CAD: off ASA/Plavix.  Will restart ASAP after procedures  7.  Dispo: I offered her to go home and await biopsy results and do fistula as an outpatient; she prefers to remain here until fistula placement on Monday and biopsy results are back.  Likely home Monday then.  I will arrange followup with me in clinic.  Subjective:    No events, no complaints.   Objective:   BP (!) 143/81 (BP Location: Left Arm)   Pulse 80   Temp 98.8 F (37.1 C) (Oral)   Resp 18   Ht 5\' 5"  (1.651 m)   Wt 104.3 kg (230 lb)   SpO2 100%   BMI 38.27 kg/m   Intake/Output Summary (Last 24 hours) at 07/30/17 1310 Last data filed at 07/30/17 0850  Gross per 24 hour  Intake              540 ml  Output              501 ml  Net               39 ml   Weight change: 1.814 kg (4 lb)  Physical Exam: GEN NAD, sitting in bed HEENT EOMI, PERRL NECK no JVD PULM clear CV RRR ABD soft, nontender, biopsy site c/d/i without bruising.   EXT no LE edema NEURO AAO x 3 SKIN no rashes  Imaging: No results found.  Labs: BMET  Recent Labs Lab 07/24/17 1300 07/25/17 0537 07/27/17 0909 07/27/17 1640 07/29/17 0440  NA 138 139 138 138 140  K 4.4 4.0 4.7 4.8 4.8  CL 108 109 109 106 109  CO2 23 21* 21* 24 24   GLUCOSE 132* 139* 156* 178* 163*  BUN 38* 36* 41* 42* 54*  CREATININE 4.63* 4.32* 4.74* 5.05* 5.41*  CALCIUM 8.5* 8.5* 8.7* 8.5* 8.7*  PHOS  --   --  4.8* 4.9*  --    CBC  Recent Labs Lab 07/26/17 0421 07/27/17 0252 07/27/17 1031 07/29/17 0440  WBC 5.8 5.8 6.4 7.1  HGB 8.7* 8.7* 9.2* 9.0*  HCT 27.2* 27.5* 28.7* 28.3*  MCV 83.7 84.1 83.9 84.5  PLT 226 238 228 236    Medications:    . carvedilol  25 mg Oral BID WC  . darbepoetin (ARANESP) injection - NON-DIALYSIS  100 mcg Subcutaneous Q Tue-1800  . diltiazem  240 mg Oral Daily  . furosemide  40 mg Oral Daily  . heparin subcutaneous  5,000 Units Subcutaneous Q8H  . insulin aspart  0-9 Units Subcutaneous TID WC  . insulin aspart  3 Units Subcutaneous TID WC  . insulin glargine  10 Units Subcutaneous QHS  . megestrol  40 mg  Oral Daily      Madelon Lips, MD [place]  pgr [phone] 07/30/2017, 1:10 PM

## 2017-07-30 NOTE — Progress Notes (Signed)
   Subjective: Patient has no complaints today.she is waiting for a fistula placement tomorrow morning.  Objective:  Vital signs in last 24 hours: Vitals:   07/29/17 1804 07/29/17 2114 07/30/17 0434 07/30/17 0759  BP: 133/76 (!) 160/89 135/62 (!) 143/81  Pulse: 81 84 94 80  Resp:  18 18 18   Temp:  98.8 F (37.1 C) 98.9 F (37.2 C) 98.8 F (37.1 C)  TempSrc:  Oral Oral Oral  SpO2:  100% 100% 100%  Weight:  230 lb (104.3 kg)    Height:  5\' 5"  (1.651 m)     Gen. Well-developed, well-nourished lady, in no acute distress. Chest. Clear bilaterally. CV. Regular rate and rhythm. Abdomen. Soft, non tender, bowel sounds positive. Extremities. No edema, no cyanosis, pulses intact and symmetrical.  Assessment/Plan:  ESRD: Preparing patient for eventual dialysis.biopsy results are pending.Going for fistula placement tomorrow morning. -we appreciate nephrology's recommendations.  Symptomatic Normocytic Anemia: -Hgb stable. She denies any exertional dyspnea. --Darbepoetin alfa 131mcg by nephrology  . Diabetes Mellitus: A1c on 06/21/2017 was 7.3. CBG remained between 126- 205 over last 24 hour -continue Lantus 10 units at bedtime. -Continue sensitive sliding scale with mealtime coverage of 3 units.   Dispo: Anticipated discharge in approximately 1-2 day(s).   Lorella Nimrod, MD 07/30/2017, 10:19 AM Pager: [phone]

## 2017-07-31 ENCOUNTER — Inpatient Hospital Stay (HOSPITAL_COMMUNITY): Payer: Medicaid Other | Admitting: Certified Registered Nurse Anesthetist

## 2017-07-31 ENCOUNTER — Encounter (HOSPITAL_COMMUNITY)
Admission: EM | Disposition: A | Discharge status: Home / Self Care | Payer: Self-pay | Source: Home / Self Care | Attending: Internal Medicine

## 2017-07-31 DIAGNOSIS — N184 Chronic kidney disease, stage 4 (severe): Secondary | ICD-10-CM

## 2017-07-31 HISTORY — PX: AV FISTULA PLACEMENT: SHX1204

## 2017-07-31 LAB — GLUCOSE, CAPILLARY
GLUCOSE-CAPILLARY: 134 mg/dL — AB (ref 65–99)
GLUCOSE-CAPILLARY: 140 mg/dL — AB (ref 65–99)
Glucose-Capillary: 128 mg/dL — ABNORMAL HIGH (ref 65–99)
Glucose-Capillary: 116 mg/dL — ABNORMAL HIGH (ref 65–99)
Glucose-Capillary: 193 mg/dL — ABNORMAL HIGH (ref 65–99)

## 2017-07-31 SURGERY — ARTERIOVENOUS (AV) FISTULA CREATION
Anesthesia: Monitor Anesthesia Care | Site: Arm Upper | Laterality: Right

## 2017-07-31 MED ORDER — LIDOCAINE HCL (PF) 1 % IJ SOLN
INTRAMUSCULAR | Status: AC
  Filled 2017-07-31: qty 30

## 2017-07-31 MED ORDER — ONDANSETRON HCL 4 MG/2ML IJ SOLN: 4.0000 mg | Freq: Once | INTRAMUSCULAR | Status: DC | PRN

## 2017-07-31 MED ORDER — MEPERIDINE HCL 25 MG/ML IJ SOLN: 6.2500 mg | INTRAMUSCULAR | Status: DC | PRN

## 2017-07-31 MED ORDER — DEXTROSE 5 % IV SOLN
INTRAVENOUS | Status: DC | PRN
  Administered 2017-07-31: 1.5 g via INTRAVENOUS

## 2017-07-31 MED ORDER — EPINEPHRINE PF 1 MG/10ML IJ SOSY
PREFILLED_SYRINGE | INTRAMUSCULAR | Status: AC
  Filled 2017-07-31: qty 10

## 2017-07-31 MED ORDER — 0.9 % SODIUM CHLORIDE (POUR BTL) OPTIME
TOPICAL | Status: DC | PRN
  Administered 2017-07-31: 1000 mL

## 2017-07-31 MED ORDER — CALCIUM CHLORIDE 10 % IV SOLN
INTRAVENOUS | Status: AC
  Filled 2017-07-31: qty 10

## 2017-07-31 MED ORDER — DIPHENHYDRAMINE HCL 50 MG/ML IJ SOLN
INTRAMUSCULAR | Status: AC
  Filled 2017-07-31: qty 1

## 2017-07-31 MED ORDER — PROPOFOL 500 MG/50ML IV EMUL
INTRAVENOUS | Status: DC | PRN
  Administered 2017-07-31: 50 ug/kg/min via INTRAVENOUS

## 2017-07-31 MED ORDER — PHENYLEPHRINE 40 MCG/ML (10ML) SYRINGE FOR IV PUSH (FOR BLOOD PRESSURE SUPPORT)
PREFILLED_SYRINGE | INTRAVENOUS | Status: AC
  Filled 2017-07-31: qty 20

## 2017-07-31 MED ORDER — ONDANSETRON HCL 4 MG/2ML IJ SOLN
INTRAMUSCULAR | Status: AC
  Filled 2017-07-31: qty 2

## 2017-07-31 MED ORDER — MIDAZOLAM HCL 2 MG/2ML IJ SOLN
INTRAMUSCULAR | Status: AC
  Filled 2017-07-31: qty 2

## 2017-07-31 MED ORDER — OXYCODONE-ACETAMINOPHEN 5-325 MG PO TABS
1.0000 | ORAL_TABLET | Freq: Four times a day (QID) | ORAL | Status: DC | PRN
  Administered 2017-08-01 (×2): 1 via ORAL
  Filled 2017-07-31 (×2): qty 1
  Filled 2017-07-31: qty 2

## 2017-07-31 MED ORDER — LIDOCAINE HCL (PF) 1 % IJ SOLN
INTRAMUSCULAR | Status: DC | PRN
  Administered 2017-07-31: 5 mL

## 2017-07-31 MED ORDER — EPHEDRINE 5 MG/ML INJ
INTRAVENOUS | Status: AC
  Filled 2017-07-31: qty 10

## 2017-07-31 MED ORDER — HEPARIN SODIUM (PORCINE) 5000 UNIT/ML IJ SOLN
INTRAMUSCULAR | Status: DC | PRN
  Administered 2017-07-31: 14:00:00

## 2017-07-31 MED ORDER — DEXTROSE 5 % IV SOLN
INTRAVENOUS | Status: AC
  Filled 2017-07-31: qty 1.5

## 2017-07-31 MED ORDER — FENTANYL CITRATE (PF) 250 MCG/5ML IJ SOLN
INTRAMUSCULAR | Status: AC
Start: 2017-07-31 — End: 2017-07-31
  Filled 2017-07-31: qty 5

## 2017-07-31 MED ORDER — FENTANYL CITRATE (PF) 100 MCG/2ML IJ SOLN
INTRAMUSCULAR | Status: DC | PRN
  Administered 2017-07-31 (×3): 50 ug via INTRAVENOUS

## 2017-07-31 MED ORDER — MIDAZOLAM HCL 5 MG/5ML IJ SOLN
INTRAMUSCULAR | Status: DC | PRN
  Administered 2017-07-31: 2 mg via INTRAVENOUS

## 2017-07-31 MED ORDER — FENTANYL CITRATE (PF) 100 MCG/2ML IJ SOLN
INTRAMUSCULAR | Status: AC
  Administered 2017-07-31: 50 ug via INTRAVENOUS
  Filled 2017-07-31: qty 2

## 2017-07-31 MED ORDER — LIDOCAINE 2% (20 MG/ML) 5 ML SYRINGE
INTRAMUSCULAR | Status: DC | PRN
  Administered 2017-07-31: 80 mg via INTRAVENOUS

## 2017-07-31 MED ORDER — DEXAMETHASONE SODIUM PHOSPHATE 10 MG/ML IJ SOLN
INTRAMUSCULAR | Status: AC
  Filled 2017-07-31: qty 1

## 2017-07-31 MED ORDER — FENTANYL CITRATE (PF) 100 MCG/2ML IJ SOLN
25.0000 ug | INTRAMUSCULAR | Status: DC | PRN
  Administered 2017-07-31: 50 ug via INTRAVENOUS

## 2017-07-31 MED ORDER — SODIUM CHLORIDE 0.9 % IV SOLN
INTRAVENOUS | Status: DC
  Administered 2017-07-31 (×2): via INTRAVENOUS

## 2017-07-31 SURGICAL SUPPLY — 32 items
ARMBAND PINK RESTRICT EXTREMIT (MISCELLANEOUS) ×2 IMPLANT
CANISTER SUCT 3000ML PPV (MISCELLANEOUS) ×2 IMPLANT
CLIP VESOCCLUDE MED 6/CT (CLIP) ×2 IMPLANT
CLIP VESOCCLUDE SM WIDE 6/CT (CLIP) ×2 IMPLANT
COVER PROBE W GEL 5X96 (DRAPES) ×2 IMPLANT
DECANTER SPIKE VIAL GLASS SM (MISCELLANEOUS) ×2 IMPLANT
DERMABOND ADVANCED (GAUZE/BANDAGES/DRESSINGS) ×1
DERMABOND ADVANCED .7 DNX12 (GAUZE/BANDAGES/DRESSINGS) ×1 IMPLANT
ELECT REM PT RETURN 9FT ADLT (ELECTROSURGICAL) ×2
ELECTRODE REM PT RTRN 9FT ADLT (ELECTROSURGICAL) ×1 IMPLANT
GLOVE BIO SURGEON STRL SZ 6.5 (GLOVE) ×2 IMPLANT
GLOVE BIO SURGEON STRL SZ7 (GLOVE) ×2 IMPLANT
GLOVE BIOGEL PI IND STRL 6.5 (GLOVE) ×2 IMPLANT
GLOVE BIOGEL PI IND STRL 7.5 (GLOVE) ×1 IMPLANT
GLOVE BIOGEL PI INDICATOR 6.5 (GLOVE) ×2
GLOVE BIOGEL PI INDICATOR 7.5 (GLOVE) ×1
GOWN STRL REUS W/ TWL LRG LVL3 (GOWN DISPOSABLE) ×3 IMPLANT
GOWN STRL REUS W/TWL LRG LVL3 (GOWN DISPOSABLE) ×3
HEMOSTAT SPONGE AVITENE ULTRA (HEMOSTASIS) IMPLANT
KIT BASIN OR (CUSTOM PROCEDURE TRAY) ×2 IMPLANT
KIT ROOM TURNOVER OR (KITS) ×2 IMPLANT
NEEDLE HYPO 25GX1X1/2 BEV (NEEDLE) ×2 IMPLANT
NS IRRIG 1000ML POUR BTL (IV SOLUTION) ×2 IMPLANT
PACK CV ACCESS (CUSTOM PROCEDURE TRAY) ×2 IMPLANT
PAD ARMBOARD 7.5X6 YLW CONV (MISCELLANEOUS) ×4 IMPLANT
SUT MNCRL AB 4-0 PS2 18 (SUTURE) ×2 IMPLANT
SUT PROLENE 6 0 BV (SUTURE) ×2 IMPLANT
SUT PROLENE 7 0 BV 1 (SUTURE) ×2 IMPLANT
SUT VIC AB 3-0 SH 27 (SUTURE) ×1
SUT VIC AB 3-0 SH 27X BRD (SUTURE) ×1 IMPLANT
UNDERPAD 30X30 (UNDERPADS AND DIAPERS) ×2 IMPLANT
WATER STERILE IRR 1000ML POUR (IV SOLUTION) ×2 IMPLANT

## 2017-07-31 NOTE — Interval H&P Note (Signed)
   History and Physical Update  The patient was interviewed and re-examined.  The patient's previous History and Physical has been reviewed and is unchanged from Dr. Luther Parody consult.  There is no change in the plan of care: R BC AVF placement.   Risk, benefits, and alternatives to access surgery were discussed.    The patient is aware the risks include but are not limited to: bleeding, infection, steal syndrome, nerve damage, ischemic monomelic neuropathy, thrombosis, failure to mature, complications related to venous hypertension, need for additional procedures, death and stroke.    The patient agrees to proceed forward with the procedure.   Adele Barthel, MD, FACS [facility] [place] Office: [phone] Pager: [phone]  07/31/2017, 1:25 PM

## 2017-07-31 NOTE — Op Note (Signed)
OPERATIVE NOTE   PROCEDURE: right brachiocephalic arteriovenous fistula placement  PRE-OPERATIVE DIAGNOSIS: chronic kidney disease stage IV-V  POST-OPERATIVE DIAGNOSIS: same as above   SURGEON: Adele Barthel, MD  ASSISTANT(S): RNFA  ANESTHESIA: local and MAC  ESTIMATED BLOOD LOSS: 30 cc  FINDING(S): 1.  Cephalic vein: 8.1-8.5 mm, acceptable 2.  Brachial artery: 2.5-3.0 mm, disease free 3.  Venous outflow: faintly palpable thrill  4.  Radial flow: dopplerable radial signal  SPECIMEN(S):  none  INDICATIONS:   Jane [name] is a 41 y.o. female who presents with chronic kidney disease stage IV-V.  The patient is scheduled for right brachiocephalic arteriovenous fistula placement.  The patient is aware the risks include but are not limited to: bleeding, infection, steal syndrome, nerve damage, ischemic monomelic neuropathy, failure to mature, and need for additional procedures.  The patient is aware of the risks of the procedure and elects to proceed forward.   DESCRIPTION: After full informed written consent was obtained from the patient, the patient was brought back to the operating room and placed supine upon the operating table.  Prior to induction, the patient received IV antibiotics.   After obtaining adequate anesthesia, the patient was then prepped and draped in the standard fashion for a right arm access procedure.  I turned my attention first to identifying the patient's cephalic vein and brachial artery.  Using SonoSite guidance, the location of these vessels were marked out on the skin.     At this point, I injected local anesthetic to obtain a field block of the antecubitum.  In total, I injected about 5 mL of 1% lidocaine without epinephrine.  I made a transverse incision at the level of the antecubitum and dissected through the subcutaneous tissue and fascia to gain exposure of the brachial artery.  This was noted to be 2.5-3.0 mm in diameter externally.  This was  dissected out proximally and distally and controlled with vessel loops .  I then dissected out the cephalic vein.  This was noted to be 4.5-5.0 mm in diameter externally.  The distal segment of the vein was ligated with a  2-0 silk, and the vein was transected.  The proximal segment was interrogated with serial dilators.  The vein accepted up to a 5 mm dilator without any difficulty.  I then instilled the heparinized saline into the vein and clamped it.  At this point, I reset my exposure of the brachial artery and placed the artery under tension proximally and distally.  I made an arteriotomy with a #11 blade, and then I extended the arteriotomy with a Potts scissor.  I injected heparinized saline proximal and distal to this arteriotomy.  The vein was then sewn to the artery in an end-to-side configuration with a running stitch of 7-0 Prolene.  Prior to completing this anastomosis, I allowed the vein and artery to backbleed.  There was no evidence of clot from any vessels.  I completed the anastomosis in the usual fashion and then released all vessel loops and clamps.    There was a faintly palpable thrill in the venous outflow, and there was a dopplerable radial signal.  There appeared to be some tension on the fistula, so I transected some additional fascia that appeared to tethering the fistula.  I also ligated the cubital vein which also appeared to be tethering the fistula.  These manuevers allowed the fistula to be tension free.  At this point, I irrigated out the surgical wound.  There was  no further active bleeding.  The subcutaneous tissue was reapproximated with a running stitch of 3-0 Vicryl.  The skin was then reapproximated with a running subcuticular stitch of 4-0 Vicryl.  The skin was then cleaned, dried, and reinforced with Dermabond.  The patient tolerated this procedure well.    COMPLICATIONS: none  CONDITION: stable   Adele Barthel, MD, [facility]  [place] Office: [phone] Pager: [phone]  07/31/2017, 3:30 PM

## 2017-07-31 NOTE — Anesthesia Preprocedure Evaluation (Signed)
Anesthesia Evaluation  Patient identified by MRN, date of birth, ID band Patient awake    Reviewed: Allergy & Precautions, H&P , NPO status , Patient's Chart, lab work & pertinent test results, reviewed documented beta blocker date and time   Airway Mallampati: II  TM Distance: >3 FB Neck ROM: Full    Dental no notable dental hx. (+) Teeth Intact, Dental Advisory Given   Pulmonary asthma , former smoker,    Pulmonary exam normal breath sounds clear to auscultation       Cardiovascular hypertension, Pt. on medications and Pt. on home beta blockers + CAD, + Cardiac Stents and + Peripheral Vascular Disease   Rhythm:Regular Rate:Normal     Neuro/Psych Depression negative neurological ROS  negative psych ROS   GI/Hepatic negative GI ROS, Neg liver ROS,   Endo/Other  diabetes, Insulin Dependent  Renal/GU Renal InsufficiencyRenal disease  negative genitourinary   Musculoskeletal  (+) Arthritis , Osteoarthritis,    Abdominal   Peds  Hematology negative hematology ROS (+)   Anesthesia Other Findings Cath 5/17 Ost LAD to Mid LAD lesion, 90% stenosed. Post intervention, there is a 0% residual stenosis.  The left ventricular systolic function is normal.  ECHO 5/17  Study Conclusions - Left ventricle: The cavity size was normal. Wall thickness was   normal. Systolic function was normal. The estimated ejection   fraction was in the range of 60% to 65%. Wall motion was normal;   there were no regional wall motion abnormalities. Doppler   parameters are consistent with abnormal left ventricular   relaxation (grade 1 diastolic dysfunction). - Tricuspid valve: There was mild regurgitation.  Reproductive/Obstetrics negative OB ROS                             Anesthesia Physical  Anesthesia Plan  ASA: III  Anesthesia Plan: MAC   Post-op Pain Management:  Regional for Post-op pain   Induction:  Intravenous  PONV Risk Score and Plan: 2 and Ondansetron, Dexamethasone, Treatment may vary due to age or medical condition, Scopolamine patch - Pre-op and Midazolam  Airway Management Planned: Natural Airway, Nasal Cannula and Simple Face Mask  Additional Equipment:   Intra-op Plan:   Post-operative Plan: Extubation in OR  Informed Consent: I have reviewed the patients History and Physical, chart, labs and discussed the procedure including the risks, benefits and alternatives for the proposed anesthesia with the patient or authorized representative who has indicated his/her understanding and acceptance.   Dental advisory given  Plan Discussed with: CRNA  Anesthesia Plan Comments:         Anesthesia Quick Evaluation

## 2017-07-31 NOTE — Progress Notes (Signed)
  [facility] Progress Note    Assessment/ Plan:    1.  Rapidly progressive renal failure: Cr 1.4--> 5.37 over the past year.  ANA  + 1:1280, complements WNL, urine sediment active in clinic.  s/p renal biopsy 9/12, awaiting results, hopefully today.   For AVF today, doesn't yet need to start HD. Pt wishes for DC post AVF today if stable, I support, can f/u results of biopsy as outpatient.    2.  DM II: historically poor control  3  HTN: Stable on Coreg and cardizem as well as Lasix.  5.  Anemia: Hb slowy improving.  Aranesp 100 mcg q Tuesday ordered.    6.  CAD: off ASA/Plavix.  Will restart ASAP after procedures   Subjective:    No events, no complaints.  Says has good appetite For AVF today   Objective:   BP (!) 149/86 (BP Location: Right Arm)   Pulse 87   Temp 99.1 F (37.3 C) (Oral)   Resp 18   Ht 5\' 5"  (1.651 m)   Wt 104.3 kg (230 lb)   SpO2 100%   BMI 38.27 kg/m   Intake/Output Summary (Last 24 hours) at 07/31/17 0957 Last data filed at 07/31/17 0845  Gross per 24 hour  Intake              418 ml  Output             1400 ml  Net             -982 ml   Weight change: 0 kg (0 lb)  Physical Exam: GEN NAD, sitting in chair HEENT EOMI, NECK no JVD PULM clear CV RRR ABD soft, nontender EXT no LE edema NEURO AAO x 3 SKIN no rashes  Imaging: No results found.  Labs: BMET  Recent Labs Lab 07/24/17 1300 07/25/17 0537 07/27/17 0909 07/27/17 1640 07/29/17 0440  NA 138 139 138 138 140  K 4.4 4.0 4.7 4.8 4.8  CL 108 109 109 106 109  CO2 23 21* 21* 24 24  GLUCOSE 132* 139* 156* 178* 163*  BUN 38* 36* 41* 42* 54*  CREATININE 4.63* 4.32* 4.74* 5.05* 5.41*  CALCIUM 8.5* 8.5* 8.7* 8.5* 8.7*  PHOS  --   --  4.8* 4.9*  --    CBC  Recent Labs Lab 07/26/17 0421 07/27/17 0252 07/27/17 1031 07/29/17 0440  WBC 5.8 5.8 6.4 7.1  HGB 8.7* 8.7* 9.2* 9.0*  HCT 27.2* 27.5* 28.7* 28.3*  MCV 83.7 84.1 83.9 84.5  PLT 226 238 228 236     Medications:    . carvedilol  25 mg Oral BID WC  . Chlorhexidine Gluconate Cloth  6 each Topical Q0600  . darbepoetin (ARANESP) injection - NON-DIALYSIS  100 mcg Subcutaneous Q Tue-1800  . diltiazem  240 mg Oral Daily  . furosemide  40 mg Oral Daily  . insulin aspart  0-9 Units Subcutaneous TID WC  . insulin aspart  3 Units Subcutaneous TID WC  . insulin glargine  10 Units Subcutaneous QHS  . megestrol  40 mg Oral Daily  . mupirocin ointment  1 application Nasal BID      [facility]  07/31/2017, 9:57 AM

## 2017-07-31 NOTE — H&P (View-Only) (Signed)
Hospital Consult    Reason for Consult:  In need of HD access Requesting Physician:  Hollie Salk MRN #:  [id number]  History of Present Illness: This is a 41 y.o. female with a hx of CKD that was first seen by nephrology in August 2018.  She had a renal bx on 07/26/17 per [facility] nephropathology and the results are pending.    She called the nephrologist with c/o N/V, metallic taste and flank pain.  She was instructed to go to the ED.  She has rapidly progressive renal failure.  She states that a couple of weeks ago, she was at a football game and drank water and Gatorade but did not void for over 8 hours.  She states that she just has not felt good.    She is in need of permanent HD access and [facility] is consulted.    She does have hx of diabetes and is on insulin.  She is on a statin for cholesterol management.   She has hx of CAD with ostial LAD to mid LAD lesion that a stent was placed in 2017 and she is on Plavix.  Her aspirin and Plavix are being held at this time.  She is on a beta blocker and CCB for blood pressure management.  She has remote tobacco hx as she quit in 2016.  Past Medical History:  Diagnosis Date  . Asthma    prn inhaler  . CAD S/P BMS PCI to prox LAD 03/31/2016   Ost LAD to Mid LAD lesion, 90% stenosed. Post intervention - Vision BMS 3.0 mm x 18 mm (~3.5 mm) there is a 0% residual stenosis.   . Charcot foot due to diabetes mellitus ([place]) 11/2016   left  . Depression   . Diabetic neuropathy (HCC)    feet  . H/O non-ST elevation myocardial infarction (NSTEMI) 03/2016   Found in 9% mid LAD lesion treated with bare-metal stent (BMS) PCI - vision BMS 3.0 mm x 18 mm  . History of MRSA infection   . Hyperlipidemia   . Hypertension    medication dose increased 11/22/2015; has been taking med. consistently since 03/2016, per pt.  . Insulin dependent diabetes mellitus ([place])   . Iron deficiency anemia    takes iron supplement  . Retinopathy of both eyes   . Sickle cell trait ([place])     . 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 ACHILLES TENDON LENGTHENING Left 11/24/2016   Procedure: Left Achilles tendon lengthening (open);  Surgeon: Wylene Simmer, MD;  Location: [place];  Service: Orthopedics;  Laterality: Left;  . CALCANEAL OSTEOTOMY Left 11/24/2016   Procedure: Left hindfoot osteotomy and fusion;  Surgeon: Wylene Simmer, MD;  Location: [place];  Service: Orthopedics;  Laterality: Left;  . CARDIAC CATHETERIZATION N/A 03/31/2016   Procedure: Left Heart Cath and Coronary Angiography;  Surgeon: Lorretta Harp, MD;  Location: [facility] INVASIVE CV LAB: 90% Shawnette Augello mLAD, normal LV Fxn  . CARDIAC CATHETERIZATION N/A 03/31/2016   Procedure: Coronary Stent Intervention;  Surgeon: Lorretta Harp, MD;  Location: [place] CV LAB;  Service: Cardiovascular: PCI to mLAD BMS Vision 3.0 mm x 18 mm  . CESAREAN SECTION  1997  . TRANSTHORACIC ECHOCARDIOGRAM  03/2016   Normal LV size and thickness. EF 60-65%. GR 1 DD. Otherwise essentially normal.  . TUBAL LIGATION  1997    Allergies  Allergen Reactions  . Adhesive [Tape]  Other (See Comments)    Irritation     Prior to Admission medications   Medication Sig Start Date End Date Taking? Authorizing Provider  acetaminophen (TYLENOL) 325 MG tablet Take 650 mg by mouth every 6 (six) hours as needed for mild pain.    Yes [provider]  atorvastatin (LIPITOR) 40 MG tablet Take 1 tablet (40 mg total) by mouth daily. 06/26/17  Yes Alphonzo Grieve, MD  carvedilol (COREG) 25 MG tablet Take 1 tablet (25 mg total) by mouth 2 (two) times daily with a meal. 06/26/17  Yes Alphonzo Grieve, MD  diltiazem (CARDIZEM CD) 240 MG 24 hr capsule Take 1 capsule (240 mg total) by mouth daily. 06/26/17  Yes Alphonzo Grieve, MD  ferrous sulfate 325 (65 FE) MG tablet TAKE 1 Tablet BY MOUTH EVERY MORNING WITH BREAKFAST 10/04/16  Yes Alphonzo Grieve, MD  furosemide (LASIX)  40 MG tablet Take 1 tablet (40 mg total) by mouth daily. 06/23/17  Yes Alphonzo Grieve, MD  insulin NPH-regular Human (HUMULIN 70/30) (70-30) 100 UNIT/ML injection Inject 16 Units into the skin 2 (two) times daily with a meal. 02/24/17  Yes Rivet, Carly J, MD  megestrol (MEGACE) 40 MG tablet Take 1 tablet (40 mg total) by mouth daily. Internal Medicine Program 06/27/17  Yes Lavonia Drafts, MD  ondansetron (ZOFRAN) 4 MG tablet Take 1 tablet (4 mg total) by mouth every 8 (eight) hours as needed for nausea. 07/10/17 07/10/18 Yes Shela Leff, MD  PROVENTIL HFA 108 (90 Base) MCG/ACT inhaler INHALE 2 PUFFS BY MOUTH EVERY 4 HOURS AS NEEDED FOR COUGHING, WHEEZING, OR SHORTNESS OF BREATH 03/24/17  Yes Alphonzo Grieve, MD  ACCU-CHEK FASTCLIX LANCETS MISC Check blood sugar 3x a day, diag code E32.1224, insulin dependent 06/27/17   Alphonzo Grieve, MD  Blood Glucose Monitoring Suppl (ACCU-CHEK GUIDE) w/Device KIT 1 each by Does not apply route 3 (three) times daily. 05/18/17   Alphonzo Grieve, MD  clopidogrel (PLAVIX) 75 MG tablet TAKE 1 TABLET BY MOUTH DAILY. 05/24/17   Alphonzo Grieve, MD  glucose blood (ACCU-CHEK GUIDE) test strip Check blood sugar 3x a day  as instructed 06/27/17   Alphonzo Grieve, MD  Insulin Pen Needle (B-D UF III MINI PEN NEEDLES) 31G X 5 MM MISC Use for injection of [place] as instructed. 03/30/17   Alphonzo Grieve, MD  Insulin Syringe-Needle U-100 31G X 15/64" 0.3 ML MISC Use to inject insulin two times a day 02/24/17   Rivet, Carly J, MD  liraglutide (VICTOZA) 18 MG/3ML SOPN Inject 0.2 mLs (1.2 mg total) into the skin daily. Patient not taking: Reported on 07/24/2017 06/22/17   Alphonzo Grieve, MD    Social History   Social History  . Marital status: Single    Spouse name: N/A  . Number of children: 2  . Years of education: 13   Occupational History  .   [employer]   Social History Main Topics  . Smoking status: Former Smoker    Packs/day: 0.00    Years: 0.00     Quit date: 08/29/2015  . Smokeless tobacco: Never Used  . Alcohol use No  . Drug use: No  . Sexual activity: Yes    Birth control/ protection: Surgical   Other Topics Concern  . Not on file   Social History Narrative   Patient does not drink caffeine.   Patient is right handed.      Family History  Problem Relation Age of Onset  . Stroke Mother   .  Diabetes Mother   . Hypertension Mother   . Aneurysm Mother   . Diabetes Father   . Hypertension Father   . Diabetes Sister   . Hypertension Sister   . Diabetes Maternal Grandmother   . Diabetes Maternal Grandfather   . Cancer Paternal Grandfather        Prostate    ROS: '[x]'$  Positive   '[ ]'$  Negative   '[ ]'$  All sytems reviewed and are negative  Cardiac: '[]'$  chest pain/pressure '[]'$  palpitations '[]'$  SOB lying flat '[]'$  DOE  Vascular: '[]'$  pain in legs while walking '[]'$  pain in legs at rest '[]'$  pain in legs at night '[]'$  non-healing ulcers '[]'$  hx of DVT '[]'$  swelling in legs  Pulmonary: '[]'$  productive cough '[]'$  asthma/wheezing '[]'$  home O2 '[x]'$  asthma  Neurologic: '[]'$  weakness in '[]'$  arms '[]'$  legs '[]'$  numbness in '[]'$  arms '[]'$  legs '[]'$  hx of CVA '[]'$  mini stroke '[]'$ difficulty speaking or slurred speech '[]'$  temporary loss of vision in one eye '[]'$  dizziness  Hematologic: '[]'$  hx of cancer '[]'$  bleeding problems '[]'$  problems with blood clotting easily '[x]'$  sickle cell trait  Endocrine:   '[x]'$  diabetes '[]'$  thyroid disease  GI '[]'$  vomiting blood '[]'$  blood in stool '[x]'$  N/V  GU: '[x]'$  CKD/renal failure '[]'$  HD--'[]'$  M/W/F or '[]'$  T/T/S '[]'$  burning with urination '[]'$  blood in urine  Psychiatric: '[]'$  anxiety '[x]'$  depression  Musculoskeletal: '[]'$  arthritis '[]'$  joint pain  Integumentary: '[]'$  rashes '[]'$  ulcers  Constitutional: '[]'$  fever '[]'$  chills '[x]'$  N/V   Physical Examination  Vitals:   07/28/17 0458 07/28/17 0944  BP: 135/77 (!) 146/67  Pulse: 80 87  Resp: 18 17  Temp: 98.6 F (37 C) 98.6 F (37 C)  SpO2: 100% 100%   Body mass index is  38.27 kg/m.  General:  WDWN in NAD Gait: Not observed HENT: WNL, normocephalic Pulmonary: normal non-labored breathing, without Rales, rhonchi,  wheezing Cardiac: regular, without  Murmurs, rubs or gallops; without carotid bruits Abdomen:  soft, NT/ND, no masses Skin: without rashes Vascular Exam/Pulses:  Right Left  Radial 2+ (normal) 2+ (normal)  Ulnar 2+ (normal) 2+ (normal)  DP 2+ (normal) 2+ (normal)  PT 2+ (normal) 2+ (normal)   Extremities: without ischemic changes, without Gangrene , without cellulitis; without open wounds; Charcot foot left; IV in right antecubital space Musculoskeletal: no muscle wasting or atrophy  Neurologic: A&O X 3;  No focal weakness or paresthesias are detected; speech is fluent/normal Psychiatric:  The pt has Normal affect.   CBC    Component Value Date/Time   WBC 6.4 07/27/2017 1031   RBC 3.42 (L) 07/27/2017 1031   HGB 9.2 (L) 07/27/2017 1031   HGB 9.9 (L) 04/08/2016 1211   HCT 28.7 (L) 07/27/2017 1031   HCT 31.2 (L) 04/08/2016 1211   PLT 228 07/27/2017 1031   PLT 303 04/08/2016 1211   MCV 83.9 07/27/2017 1031   MCV 81 04/08/2016 1211   MCH 26.9 07/27/2017 1031   MCHC 32.1 07/27/2017 1031   RDW 13.8 07/27/2017 1031   RDW 16.6 (H) 04/08/2016 1211   LYMPHSABS 2.0 01/03/2016 0400   MONOABS 0.5 01/03/2016 0400   EOSABS 0.2 01/03/2016 0400   BASOSABS 0.0 01/03/2016 0400    BMET    Component Value Date/Time   NA 138 07/27/2017 1640   NA 142 06/28/2017 0940   K 4.8 07/27/2017 1640   CL 106 07/27/2017 1640   CO2 24 07/27/2017 1640   GLUCOSE 178 (H) 07/27/2017 1640   BUN  42 (H) 07/27/2017 1640   BUN 64 (H) 06/28/2017 0940   CREATININE 5.05 (H) 07/27/2017 1640   CREATININE 3.40 (H) 03/27/2017 0759   CALCIUM 8.5 (L) 07/27/2017 1640   GFRNONAA 10 (L) 07/27/2017 1640   GFRNONAA 65 06/09/2015 1211   GFRAA 11 (L) 07/27/2017 1640   GFRAA 75 06/09/2015 1211    COAGS: Lab Results  Component Value Date   INR 1.13 07/26/2017   INR  1.07 03/31/2016   INR 1.0 01/11/2009     Non-Invasive Vascular Imaging:   Vein mapping pending  Statin:  Yes.   Beta Blocker:  Yes.   Aspirin:  Yes.   ACEI:  No. ARB:  No. CCB use:  Yes Other antiplatelets/anticoagulants:  Yes.   Plavix   ASSESSMENT/PLAN: This is a 41 y.o. female with rapidly progressive renal failure in need of permanent dialysis access.   -pt is right hand dominant.   -vein mapping is pending-will make further plan once this is completed.  -if pt is to be discharged, this can be set up on an outpatient basis.    Leontine Locket, PA-C [facility] [phone]  I have examined the patient, reviewed and agree with above. Vein map is reviewed. This does show extremely small cephalic vein throughout the arm on the left. Her cephalic vein in the upper arm is better caliber on the right greater than 3 mm throughout its course. Will discontinue her right IV today and place a saline lock block in her left arm. Plan right arm AV fistula on Monday  Curt Jews, MD 07/28/2017 6:39 PM

## 2017-07-31 NOTE — Progress Notes (Signed)
   Subjective: Patient was resting comfortably in her bed awaiting her fistular placement today upon entering the room. She denied concerns or questions. Was aware and in agreement with the plan to proceed with the fistula placement and for possible discharge home today following. She denied nausea, headache, visual changes, chest pain, abdominal pain, fever, leg pain, fatigue or SOB.  Objective:  Vital signs in last 24 hours: Vitals:   07/30/17 1804 07/30/17 2027 07/31/17 0407 07/31/17 0835  BP: (!) 151/79 140/85 140/82 (!) 149/86  Pulse: 85 83 85 87  Resp:  18 18 18   Temp:  98.7 F (37.1 C) 99.1 F (37.3 C) 99.1 F (37.3 C)  TempSrc: Oral Oral Oral Oral  SpO2:  100% 100% 100%  Weight:  230 lb (104.3 kg)    Height:       ROS negative except as per HPI.  Physical Exam  Constitutional: She appears well-developed and well-nourished. No distress.  HENT:  Head: Normocephalic and atraumatic.  Eyes: EOM are normal.  Cardiovascular: Normal rate and regular rhythm.   No murmur heard. Pulmonary/Chest: Effort normal and breath sounds normal. No respiratory distress.  Abdominal: Soft. Bowel sounds are normal. There is no tenderness.  Musculoskeletal: She exhibits no edema or tenderness.  Neurological: She is alert.  Skin: She is not diaphoretic.    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 Biopsy results are pending.  -Fistula placed today -we appreciate nephrology's recommendations. -Will plan to discharge home today if patient has recovered from her procedure  Symptomatic Normocytic Anemia: -Hgb stable. She denies any exertional dyspnea. --Darbepoetin alfa 114mcg as per  nephrology  . Diabetes Mellitus: A1c on 06/21/2017 was 7.3. CBG remained between 134- 252 over last 24 hour -continue Lantus 10 units at bedtime. -Continue sensitive sliding scale with mealtime coverage of 3 units. -plan to discharge home today if amenable   Dispo: Anticipated discharge in approximately 0-1 day(s).   Kathi Ludwig, MD 07/31/2017, 2:27 PM Pager: Pager# [phone]

## 2017-07-31 NOTE — Transfer of Care (Signed)
Immediate Anesthesia Transfer of Care Note  Patient: Jane [name]  Procedure(s) Performed: Procedure(s): ARTERIOVENOUS BRACHIOCEPHALIC FISTULA CREATION RIGHT ARM (Right)  Patient Location: PACU  Anesthesia Type:MAC  Level of Consciousness: awake, alert  and oriented  Airway & Oxygen Therapy: Patient Spontanous Breathing  Post-op Assessment: Report given to RN and Post -op Vital signs reviewed and stable  Post vital signs: Reviewed and stable  Last Vitals:  Vitals:   07/31/17 0835 07/31/17 1547  BP: (!) 149/86 132/70  Pulse: 87 80  Resp: 18 13  Temp: 37.3 C 37.2 C  SpO2: 100% 100%    Last Pain:  Vitals:   07/31/17 0835  TempSrc: Oral  PainSc:       Patients Stated Pain Goal: 2 (09/81/19 1478)  Complications: No apparent anesthesia complications

## 2017-08-01 ENCOUNTER — Encounter (HOSPITAL_COMMUNITY): Payer: Self-pay | Admitting: Vascular Surgery

## 2017-08-01 ENCOUNTER — Telehealth: Payer: Self-pay | Admitting: Vascular Surgery

## 2017-08-01 ENCOUNTER — Telehealth: Payer: Self-pay

## 2017-08-01 DIAGNOSIS — E784 Other hyperlipidemia: Secondary | ICD-10-CM

## 2017-08-01 DIAGNOSIS — Z7902 Long term (current) use of antithrombotics/antiplatelets: Secondary | ICD-10-CM

## 2017-08-01 DIAGNOSIS — Z955 Presence of coronary angioplasty implant and graft: Secondary | ICD-10-CM

## 2017-08-01 DIAGNOSIS — D631 Anemia in chronic kidney disease: Secondary | ICD-10-CM

## 2017-08-01 DIAGNOSIS — I2510 Atherosclerotic heart disease of native coronary artery without angina pectoris: Secondary | ICD-10-CM

## 2017-08-01 DIAGNOSIS — Z8742 Personal history of other diseases of the female genital tract: Secondary | ICD-10-CM

## 2017-08-01 DIAGNOSIS — Z9889 Other specified postprocedural states: Secondary | ICD-10-CM

## 2017-08-01 DIAGNOSIS — Z79899 Other long term (current) drug therapy: Secondary | ICD-10-CM

## 2017-08-01 DIAGNOSIS — E1122 Type 2 diabetes mellitus with diabetic chronic kidney disease: Secondary | ICD-10-CM

## 2017-08-01 DIAGNOSIS — I120 Hypertensive chronic kidney disease with stage 5 chronic kidney disease or end stage renal disease: Principal | ICD-10-CM

## 2017-08-01 DIAGNOSIS — Z91048 Other nonmedicinal substance allergy status: Secondary | ICD-10-CM

## 2017-08-01 DIAGNOSIS — E113493 Type 2 diabetes mellitus with severe nonproliferative diabetic retinopathy without macular edema, bilateral: Secondary | ICD-10-CM

## 2017-08-01 DIAGNOSIS — E1169 Type 2 diabetes mellitus with other specified complication: Secondary | ICD-10-CM

## 2017-08-01 DIAGNOSIS — N186 End stage renal disease: Secondary | ICD-10-CM

## 2017-08-01 DIAGNOSIS — I252 Old myocardial infarction: Secondary | ICD-10-CM

## 2017-08-01 DIAGNOSIS — Z794 Long term (current) use of insulin: Secondary | ICD-10-CM

## 2017-08-01 LAB — RENAL FUNCTION PANEL
ALBUMIN: 2.9 g/dL — AB (ref 3.5–5.0)
Anion gap: 8 (ref 5–15)
BUN: 59 mg/dL — ABNORMAL HIGH (ref 6–20)
CO2: 23 mmol/L (ref 22–32)
Calcium: 8.8 mg/dL — ABNORMAL LOW (ref 8.9–10.3)
Chloride: 107 mmol/L (ref 101–111)
Creatinine, Ser: 5.75 mg/dL — ABNORMAL HIGH (ref 0.44–1.00)
GFR, EST AFRICAN AMERICAN: 10 mL/min — AB (ref >60)
GFR, EST NON AFRICAN AMERICAN: 8 mL/min — AB (ref >60)
Glucose, Bld: 193 mg/dL — ABNORMAL HIGH (ref 65–99)
POTASSIUM: 4.8 mmol/L (ref 3.5–5.1)
Phosphorus: 5.7 mg/dL — ABNORMAL HIGH (ref 2.5–4.6)
Sodium: 138 mmol/L (ref 135–145)

## 2017-08-01 LAB — GLUCOSE, CAPILLARY
GLUCOSE-CAPILLARY: 192 mg/dL — AB (ref 65–99)
Glucose-Capillary: 176 mg/dL — ABNORMAL HIGH (ref 65–99)

## 2017-08-01 MED ORDER — MUPIROCIN 2 % EX OINT: 1.0000 "application " | TOPICAL_OINTMENT | Freq: Two times a day (BID) | CUTANEOUS | 0 refills | Status: DC

## 2017-08-01 MED ORDER — "INSULIN SYRINGE-NEEDLE U-100 31G X 15/64"" 0.3 ML MISC": 12 refills | Status: DC

## 2017-08-01 MED ORDER — INSULIN GLARGINE 100 UNIT/ML ~~LOC~~ SOLN: 12.0000 [IU] | Freq: Every day | SUBCUTANEOUS | 11 refills | Status: DC

## 2017-08-01 MED ORDER — INSULIN ASPART 100 UNIT/ML ~~LOC~~ SOLN: 6.0000 [IU] | Freq: Three times a day (TID) | SUBCUTANEOUS | 11 refills | Status: DC

## 2017-08-01 MED FILL — NovoLOG 100 UNIT/ML SOLN: 100 | 28 days supply | Qty: 10 | Fill #0

## 2017-08-01 MED FILL — BD SYR 0.3 ML 31GX15/64 (1/: 31G X 15/64 | 33 days supply | Qty: 100 | Fill #0

## 2017-08-01 MED FILL — MUPIROCIN 2% OINTMENT: 2 | 30 days supply | Qty: 22 | Fill #0

## 2017-08-01 MED FILL — LANTUS 100 UNITS/ML VIAL: 100 | 28 days supply | Qty: 10 | Fill #0

## 2017-08-01 NOTE — Discharge Instructions (Signed)
Please follow up with nephrology within 1-2 weeks at their next available appointment.  Please follow up with her primary care provider on Monday. In addition vascular surgery would like you to follow up with them at the time listed above.  At your follow-up visit he may discussed your diabetes management. Please bring him with you her most recent set of blood glucose recordings on the week leading up to your appointment. This will assist her PCP and providing you with the best medication regimen.  Please continue to hold your Plavix until you meet with Nephrology.  If at any time he were to develop symptoms of hypoglycemia such as, but not limited to, nausea, confusion, cold sweats, weakness, shaking, or other concerning symptoms, was called clinic and or return to the ED if they are unavailable.  Thank you for your visit to [facility].

## 2017-08-01 NOTE — Progress Notes (Signed)
Jane [name] to be D/C'd Home per MD order.  Discussed prescriptions and follow up appointments with the patient. Prescriptions given to patient, medication list explained in detail. Pt verbalized understanding.  Allergies as of 08/01/2017      Reactions   Adhesive [tape] Other (See Comments)   Irritation      Medication List    STOP taking these medications   clopidogrel 75 MG tablet Commonly known as:  PLAVIX   insulin NPH-regular Human (70-30) 100 UNIT/ML injection Commonly known as:  HUMULIN 70/30   Insulin Pen Needle 31G X 5 MM Misc Commonly known as:  B-D UF III MINI PEN NEEDLES   liraglutide 18 MG/3ML Sopn Commonly known as:  VICTOZA   ondansetron 4 MG tablet Commonly known as:  ZOFRAN     TAKE these medications   ACCU-CHEK FASTCLIX LANCETS Misc Check blood sugar 3x a day, diag code D32.2738, insulin dependent   ACCU-CHEK GUIDE w/Device Kit 1 each by Does not apply route 3 (three) times daily.   acetaminophen 325 MG tablet Commonly known as:  TYLENOL Take 650 mg by mouth every 6 (six) hours as needed for mild pain.   atorvastatin 40 MG tablet Commonly known as:  LIPITOR Take 1 tablet (40 mg total) by mouth daily.   carvedilol 25 MG tablet Commonly known as:  COREG Take 1 tablet (25 mg total) by mouth 2 (two) times daily with a meal.   diltiazem 240 MG 24 hr capsule Commonly known as:  CARDIZEM CD Take 1 capsule (240 mg total) by mouth daily.   ferrous sulfate 325 (65 FE) MG tablet TAKE 1 Tablet BY MOUTH EVERY MORNING WITH BREAKFAST   furosemide 40 MG tablet Commonly known as:  LASIX Take 1 tablet (40 mg total) by mouth daily.   glucose blood test strip Commonly known as:  ACCU-CHEK GUIDE Check blood sugar 3x a day  as instructed   insulin aspart 100 UNIT/ML injection Commonly known as:  novoLOG Inject 6 Units into the skin 3 (three) times daily with meals.   insulin glargine 100 UNIT/ML injection Commonly known as:  LANTUS Inject 0.12  mLs (12 Units total) into the skin at bedtime.   Insulin Syringe-Needle U-100 31G X 15/64" 0.3 ML Misc Use to inject insulin three times a day What changed:  additional instructions   megestrol 40 MG tablet Commonly known as:  MEGACE Take 1 tablet (40 mg total) by mouth daily. Internal Medicine Program   mupirocin ointment 2 % Commonly known as:  BACTROBAN Place 1 application into the nose 2 (two) times daily.   PROVENTIL HFA 108 (90 Base) MCG/ACT inhaler Generic drug:  albuterol INHALE 2 PUFFS BY MOUTH EVERY 4 HOURS AS NEEDED FOR COUGHING, WHEEZING, OR SHORTNESS OF BREATH            Discharge Care Instructions        Start     Ordered   08/01/17 0000  insulin glargine (LANTUS) 100 UNIT/ML injection  Daily at bedtime     08/01/17 1118   08/01/17 0000  insulin aspart (NOVOLOG) 100 UNIT/ML injection  3 times daily with meals     08/01/17 1118   08/01/17 0000  mupirocin ointment (BACTROBAN) 2 %  2 times daily     08/01/17 1118   08/01/17 0000  Increase activity slowly     08/01/17 1118   08/01/17 0000  Diet - low sodium heart healthy     08/01/17 1118   08/01/17  0000  Call MD for:  persistant nausea and vomiting     08/01/17 1118   08/01/17 0000  Call MD for:  difficulty breathing, headache or visual disturbances     08/01/17 1118   08/01/17 0000  Call MD for:  persistant dizziness or light-headedness     08/01/17 1118   08/01/17 0000  Insulin Syringe-Needle U-100 31G X 15/64" 0.3 ML MISC     08/01/17 1118      Vitals:   08/01/17 0908 08/01/17 0925  BP: (!) 158/82   Pulse:  78  Resp:  20  Temp:  98.4 F (36.9 C)  SpO2:  98%    Skin clean, dry and intact without evidence of skin break down, no evidence of skin tears noted. IV catheter discontinued intact. Site without signs and symptoms of complications. Dressing and pressure applied. Pt denies pain at this time. No complaints noted.  An After Visit Summary was printed and given to the patient. Patient  escorted via [place], and D/C home via private auto.  Dixie Dials RN, BSN

## 2017-08-01 NOTE — Progress Notes (Signed)
   Daily Progress Note   Assessment/Planning:   POD #1 s/p R BC AVF   No steal sx this AM  F/U in office 6-8 weeks (already scheduled)   Subjective  - 1 Day Post-Op   No complaints with R arm   Objective   Vitals:   07/31/17 1615 07/31/17 1630 07/31/17 1646 07/31/17 2043  BP: 135/68 (!) 141/72 (!) 150/79 133/70  Pulse: 78 75 78 85  Resp: 14 13 14 19   Temp:  98.2 F (36.8 C) 98.2 F (36.8 C) 98.9 F (37.2 C)  TempSrc:    Oral  SpO2: 100% 98% 100% 99%  Weight:    229 lb 11.5 oz (104.2 kg)  Height:         Intake/Output Summary (Last 24 hours) at 08/01/17 0739 Last data filed at 07/31/17 2233  Gross per 24 hour  Intake              450 ml  Output              525 ml  Net              -75 ml    VASC Improved palpable thrill in RUA, warm R hand  NEURO Hand grip intact, sensation intact    Laboratory   CBC CBC Latest Ref Rng & Units 07/29/2017 07/27/2017 07/27/2017  WBC 4.0 - 10.5 K/uL 7.1 6.4 5.8  Hemoglobin 12.0 - 15.0 g/dL 9.0(L) 9.2(L) 8.7(L)  Hematocrit 36.0 - 46.0 % 28.3(L) 28.7(L) 27.5(L)  Platelets 150 - 400 K/uL 236 228 238    BMET    Component Value Date/Time   NA 140 07/29/2017 0440   NA 142 06/28/2017 0940   K 4.8 07/29/2017 0440   CL 109 07/29/2017 0440   CO2 24 07/29/2017 0440   GLUCOSE 163 (H) 07/29/2017 0440   BUN 54 (H) 07/29/2017 0440   BUN 64 (H) 06/28/2017 0940   CREATININE 5.41 (H) 07/29/2017 0440   CREATININE 3.40 (H) 03/27/2017 0759   CALCIUM 8.7 (L) 07/29/2017 0440   GFRNONAA 9 (L) 07/29/2017 0440   GFRNONAA 65 06/09/2015 1211   GFRAA 10 (L) 07/29/2017 0440   GFRAA 75 06/09/2015 1211     Adele Barthel, MD, FACS [facility] [place] Office: [phone] Pager: [phone]  08/01/2017, 7:39 AM

## 2017-08-01 NOTE — Telephone Encounter (Signed)
-----   Message from Mena Goes, RN sent at 07/31/2017  3:36 PM EDT ----- Regarding: 6-8 weeks   ----- Message ----- From: Conrad Ericson, MD Sent: 07/31/2017   3:33 PM To: [facility] [address]  MARYANNE HUNEYCUTT [id number] 12-19-1975  PROCEDURE: right brachiocephalic arteriovenous fistula placement  Asst: RNFA  Follow-up: 6-8 weeks

## 2017-08-01 NOTE — Telephone Encounter (Signed)
Sched appt 09/22/17 at 12:30. Spoke to pt.

## 2017-08-01 NOTE — Anesthesia Postprocedure Evaluation (Signed)
Anesthesia Post Note  Patient: Jane [name]  Procedure(s) Performed: Procedure(s) (LRB): ARTERIOVENOUS BRACHIOCEPHALIC FISTULA CREATION RIGHT ARM (Right)     Patient location during evaluation: PACU Anesthesia Type: MAC Level of consciousness: awake and alert Pain management: pain level controlled Vital Signs Assessment: post-procedure vital signs reviewed and stable Respiratory status: spontaneous breathing, nonlabored ventilation, respiratory function stable and patient connected to nasal cannula oxygen Cardiovascular status: stable and blood pressure returned to baseline Postop Assessment: no apparent nausea or vomiting Anesthetic complications: no    Last Vitals:  Vitals:   08/01/17 0908 08/01/17 0925  BP: (!) 158/82   Pulse:  78  Resp:  20  Temp:  36.9 C  SpO2:  98%    Last Pain:  Vitals:   08/01/17 1207  TempSrc:   PainSc: 0-No pain   Pain Goal: Patients Stated Pain Goal: 0 (08/01/17 0845)               Lillyanne Bradburn

## 2017-08-01 NOTE — Plan of Care (Signed)
Problem: Health Behavior/Discharge Planning: Goal: Ability to manage health-related needs will improve Outcome: Progressing Assess for dc needs  Problem: Pain Managment: Goal: General experience of comfort will improve Outcome: Progressing Assess and monitor for pain needs  Problem: Physical Regulation: Goal: Ability to maintain clinical measurements within normal limits will improve Outcome: Progressing Assess and monitor response to treatment Goal: Will remain free from infection Outcome: Progressing Provide infection prevention measures. Pt is on contact precautions for MRSA with medication tx as well  Problem: Skin Integrity: Goal: Risk for impaired skin integrity will decrease Outcome: Progressing Initiate skin care protocol

## 2017-08-01 NOTE — Progress Notes (Signed)
   Subjective: Patient was resting comfortably in her bed today upon entering the room. She stated that she felt well, her procedure had been completed and denied pain. Patient denied nausea, chest pain, abdominal pain, headache, fever or chills. She would like to be placed on Lantus if at all possible.  Objective:  Vital signs in last 24 hours: Vitals:   07/31/17 1615 07/31/17 1630 07/31/17 1646 07/31/17 2043  BP: 135/68 (!) 141/72 (!) 150/79 133/70  Pulse: 78 75 78 85  Resp: 14 13 14 19   Temp:  98.2 F (36.8 C) 98.2 F (36.8 C) 98.9 F (37.2 C)  TempSrc:    Oral  SpO2: 100% 98% 100% 99%  Weight:    229 lb 11.5 oz (104.2 kg)  Height:       ROS negative except as per HPI.  Physical Exam  Constitutional: She appears well-developed and well-nourished. No distress.  HENT:  Head: Normocephalic and atraumatic.  Cardiovascular: Normal rate and regular rhythm.   No murmur heard. Pulmonary/Chest: Effort normal and breath sounds normal. No respiratory distress.  Abdominal: Soft. Bowel sounds are normal. She exhibits no distension. There is no tenderness.  Musculoskeletal: She exhibits no edema.  Neurological: She is alert.  Skin: She is not diaphoretic.  Psychiatric: She has a normal mood and affect.    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 Biopsy results are pending.  -Fistula placed yesterday as per vascular -Follow-up with nephrology as outpatient -Will plan to discharge home today  -Patients procedure went long the prior day  Symptomatic Normocytic Anemia: -Hgb stable. She denies any exertional dyspnea. --Darbepoetin alfa 182mcg as per nephrology  . Diabetes  [place]:A1c on 06/21/2017 was 7.3. CBG remained between 116- 193 over last 24 hour -continue Lantus 10 units at bedtime. -Continue sensitive sliding scale with mealtime coverage of 3 units. -plan to discharge home today   Dispo: Anticipated discharge in approximately today(s).   Kathi Ludwig, MD 08/01/2017, 7:50 AM Pager: Pager# [phone]

## 2017-08-01 NOTE — Telephone Encounter (Signed)
Hospital TOC per Dr Berline Lopes, discharge 08/01/2017, appt 08/07/2017.

## 2017-08-01 NOTE — Progress Notes (Signed)
  [facility] Progress Note    Assessment/ Plan:    1.  Rapidly progressive renal failure: Cr 1.4--> 5.37 over the past year.  ANA  + 1:1280, complements WNL, urine sediment active in clinic.  s/p renal biopsy 9/12, awaiting results.  S/p R BC AVF 9/17.  Renal Panel this AM pending, if ok can dc and has close f/u with Dr. Hollie Salk 9/20.    2.  DM II: historically poor control  3  HTN: Stable on Coreg and cardizem as well as Lasix.  5.  Anemia: Hb slowy improving.  Aranesp 100 mcg q Tuesday ordered.    6.  CAD: off ASA/Plavix.  Restart once stable at outpt visit.    Subjective:    No events, no complaints.  Says has good appetite and is w/o N/V R BC AVF yesterday Has 9/20 f/u with Hollie Salk at [facility]   Objective:   BP (!) 158/82   Pulse 78   Temp 98.4 F (36.9 C) (Oral)   Resp 20   Ht 5\' 5"  (1.651 m)   Wt 104.2 kg (229 lb 11.5 oz)   SpO2 98%   BMI 38.23 kg/m   Intake/Output Summary (Last 24 hours) at 08/01/17 1142 Last data filed at 08/01/17 0900  Gross per 24 hour  Intake              930 ml  Output              325 ml  Net              605 ml   Weight change: -0.127 kg (-4.5 oz)  Physical Exam: GEN NAD, sitting in chair HEENT EOMI, NECK no JVD PULM clear CV RRR ABD soft, nontender EXT no LE edema NEURO AAO x 3 SKIN no rashes  Imaging: No results found.  Labs: BMET  Recent Labs Lab 07/27/17 0909 07/27/17 1640 07/29/17 0440  NA 138 138 140  K 4.7 4.8 4.8  CL 109 106 109  CO2 21* 24 24  GLUCOSE 156* 178* 163*  BUN 41* 42* 54*  CREATININE 4.74* 5.05* 5.41*  CALCIUM 8.7* 8.5* 8.7*  PHOS 4.8* 4.9*  --    CBC  Recent Labs Lab 07/26/17 0421 07/27/17 0252 07/27/17 1031 07/29/17 0440  WBC 5.8 5.8 6.4 7.1  HGB 8.7* 8.7* 9.2* 9.0*  HCT 27.2* 27.5* 28.7* 28.3*  MCV 83.7 84.1 83.9 84.5  PLT 226 238 228 236    Medications:    . carvedilol  25 mg Oral BID WC  . Chlorhexidine Gluconate Cloth  6 each Topical Q0600  . darbepoetin  (ARANESP) injection - NON-DIALYSIS  100 mcg Subcutaneous Q Tue-1800  . diltiazem  240 mg Oral Daily  . furosemide  40 mg Oral Daily  . insulin aspart  0-9 Units Subcutaneous TID WC  . insulin aspart  3 Units Subcutaneous TID WC  . insulin glargine  10 Units Subcutaneous QHS  . megestrol  40 mg Oral Daily  . mupirocin ointment  1 application Nasal BID      [facility]  08/01/2017, 11:42 AM

## 2017-08-02 ENCOUNTER — Telehealth: Payer: Self-pay | Admitting: Obstetrics and Gynecology

## 2017-08-02 NOTE — Telephone Encounter (Signed)
Mychart message to patient requesting her to let us know what medication she is needing and which pharmacy to send it to.

## 2017-08-02 NOTE — Telephone Encounter (Signed)
Need a med refill prior to her Dr Appt

## 2017-08-03 ENCOUNTER — Other Ambulatory Visit: Payer: Self-pay

## 2017-08-03 ENCOUNTER — Telehealth: Payer: Self-pay

## 2017-08-03 MED ORDER — OXYCODONE-ACETAMINOPHEN 5-325 MG PO TABS: 1.0000 | ORAL_TABLET | Freq: Three times a day (TID) | ORAL | 0 refills | Status: DC | PRN

## 2017-08-03 MED FILL — OXYCODONE W/APAP 5/325 TAB: 5-325 | 2 days supply | Qty: 6 | Fill #0

## 2017-08-03 MED FILL — MEGESTROL 40 MG TABLET: 40 | 30 days supply | Qty: 30 | Fill #0

## 2017-08-03 NOTE — Telephone Encounter (Signed)
Pt walked in office today with c/o pain pain @ R BC AVF site. She is s/p R BC AVF 9/17. Pt stated that her pain was localized to around her incision site and elbow, 8/10 pain off and on. Denies fever/chills, states R hand feels cool but not cold, no numbness and she is able to grasp and hold objects with no problem. States tylenol is not helping her pain and was D/C without any pain medication. S/w Dr. Oneida Alar in office, pt was given RX for percocet 5/325, qty-6, no refills. Advised pt to contact our office with sx's of fever/chills, numbness in R hand/fingers, pain in R hand/fingers or inability to grasp/hold objects with R hand. Pt verbalized understanding and agrees with above plan,

## 2017-08-07 ENCOUNTER — Ambulatory Visit: Payer: Self-pay

## 2017-08-07 ENCOUNTER — Encounter (HOSPITAL_COMMUNITY): Payer: Self-pay

## 2017-08-08 MED FILL — FUROSEMIDE 40 MG TABLET: 40 | 30 days supply | Qty: 30 | Fill #1

## 2017-08-11 ENCOUNTER — Ambulatory Visit (INDEPENDENT_AMBULATORY_CARE_PROVIDER_SITE_OTHER): Payer: Medicaid Other | Admitting: Internal Medicine

## 2017-08-11 ENCOUNTER — Encounter: Payer: Self-pay | Admitting: Internal Medicine

## 2017-08-11 VITALS — BP 166/74 | HR 81 | Temp 98.8°F | Ht 65.0 in | Wt 235.2 lb

## 2017-08-11 DIAGNOSIS — Z87891 Personal history of nicotine dependence: Secondary | ICD-10-CM

## 2017-08-11 DIAGNOSIS — E1122 Type 2 diabetes mellitus with diabetic chronic kidney disease: Secondary | ICD-10-CM

## 2017-08-11 DIAGNOSIS — N185 Chronic kidney disease, stage 5: Secondary | ICD-10-CM | POA: Diagnosis not present

## 2017-08-11 DIAGNOSIS — Z9889 Other specified postprocedural states: Secondary | ICD-10-CM

## 2017-08-11 DIAGNOSIS — I2510 Atherosclerotic heart disease of native coronary artery without angina pectoris: Secondary | ICD-10-CM | POA: Diagnosis not present

## 2017-08-11 DIAGNOSIS — F329 Major depressive disorder, single episode, unspecified: Secondary | ICD-10-CM | POA: Insufficient documentation

## 2017-08-11 DIAGNOSIS — Z5189 Encounter for other specified aftercare: Secondary | ICD-10-CM

## 2017-08-11 DIAGNOSIS — F322 Major depressive disorder, single episode, severe without psychotic features: Secondary | ICD-10-CM

## 2017-08-11 DIAGNOSIS — N938 Other specified abnormal uterine and vaginal bleeding: Secondary | ICD-10-CM | POA: Diagnosis not present

## 2017-08-11 DIAGNOSIS — E113493 Type 2 diabetes mellitus with severe nonproliferative diabetic retinopathy without macular edema, bilateral: Secondary | ICD-10-CM

## 2017-08-11 DIAGNOSIS — D631 Anemia in chronic kidney disease: Secondary | ICD-10-CM | POA: Diagnosis not present

## 2017-08-11 DIAGNOSIS — E785 Hyperlipidemia, unspecified: Secondary | ICD-10-CM | POA: Diagnosis not present

## 2017-08-11 DIAGNOSIS — Z79899 Other long term (current) drug therapy: Secondary | ICD-10-CM

## 2017-08-11 DIAGNOSIS — E11610 Type 2 diabetes mellitus with diabetic neuropathic arthropathy: Secondary | ICD-10-CM | POA: Diagnosis not present

## 2017-08-11 DIAGNOSIS — Z794 Long term (current) use of insulin: Secondary | ICD-10-CM | POA: Diagnosis not present

## 2017-08-11 DIAGNOSIS — I10 Essential (primary) hypertension: Secondary | ICD-10-CM

## 2017-08-11 DIAGNOSIS — I770 Arteriovenous fistula, acquired: Secondary | ICD-10-CM

## 2017-08-11 DIAGNOSIS — I120 Hypertensive chronic kidney disease with stage 5 chronic kidney disease or end stage renal disease: Secondary | ICD-10-CM

## 2017-08-11 DIAGNOSIS — D649 Anemia, unspecified: Secondary | ICD-10-CM

## 2017-08-11 MED ORDER — GLUCOSE BLOOD VI STRP: ORAL_STRIP | 12 refills | Status: DC

## 2017-08-11 MED ORDER — MEGESTROL ACETATE 40 MG PO TABS: 40.0000 mg | ORAL_TABLET | Freq: Every day | ORAL | 1 refills | Status: DC

## 2017-08-11 MED ORDER — SERTRALINE HCL 50 MG PO TABS: 50.0000 mg | ORAL_TABLET | Freq: Every day | ORAL | 2 refills | Status: DC

## 2017-08-11 MED FILL — ACCU-CHEK GUIDE TEST STRIP: 30 days supply | Qty: 100 | Fill #0

## 2017-08-11 MED FILL — SERTRALINE HCL 50 MG TABLET: 50 | 30 days supply | Qty: 30 | Fill #0

## 2017-08-11 NOTE — Patient Instructions (Signed)
For you diabetes: --increase lantus to 15 units at night --continue novolog 6 units three times a day with meals --check your sugar first thing in the morning and then about 2 hours after your meals. --if you feel like your sugar is down (nausea, weak, cold sweat, blurry vision) check your sugar; if less than 80 take 27ml of apple juice and recheck in 15 minutes. --jot down in your sugar log what your sugars have been  I refilled your furosemide.  We will start sertraline 50mg  daily. It may take a couple of weeks for you to feel an effect of this dose; we will see you in about a month to recheck your symptoms and see if we need to increase the dose.  If you have any questions, please don't hesitate to call the office.

## 2017-08-11 NOTE — Assessment & Plan Note (Signed)
Patient with symptoms of decreased interest, poor sleep, poor concentration, depressed mood, fatigue in setting of recent medical issues and current plans to start dialysis in the coming months. PHQ9 today is 21 which with her symptoms indicates severe MDD.  Patient in agreement with starting antidepressant medication.   Plan: --start sertraline 50mg  daily; no renal dosing needed --advised on possible GI side effects on starting --advised on timeline on starting to feel effects --advised to call clinic or go to the ED if she has SI/HI, which she denies currently --will f/u in 4-6 weeks for repeat PHQ9 and titration of medicine

## 2017-08-11 NOTE — Progress Notes (Signed)
Medicine attending: Medical history, presenting problems, physical findings, and medications, reviewed with resident physician Dr Jari Favre on the day of the patient visit and I concur with her evaluation and management plan.

## 2017-08-11 NOTE — Progress Notes (Signed)
CC: depression, diabetes  HPI:  [name] is a 41 y.o. with a PMH of CKD stage 5, anemia, HTN, HLD, CAD s/p LAD stent, HLD, presenting to clinic for hospital follow up for anemia, CKD, diabetes and depression.  Patient hospitalized 9/10-9/18 for symptomatic anemia related to her CKD. Her symptoms resolved after transfusion of 1 unit of pRBCs and she was started on Epo per nephrology; Hgb on discharge was 9. While hospitalized, patient also underwent renal biopsy for her rapidly progressive CKD the last few months; workup at [facility] was negative for cause of CKD, however at [place], her ANA titer was elevated. Patient's renal biopsy revealed diabetic nephropathy. Patient also underwent vein mapping and RUE fistula placement during hospitalization. This was in her dominant arm, unfortunately due to poor candidates in her left UE. Patient reports to have f/u with Dr. Hollie Salk at [facility] and there have been no further changes to her management.   Today, patient denies shortness of breath, chest pain, dizziness, headache, blurry vision (from baseline). She reports running out of lasix 2 days ago, and has noticed decrease in UOP since then, though still stable. She reports improved pain of her RUE, no swelling, redness or drainage. She has felt uncomfortable using it due to fear of complicating the fistula site.   Patient was discharged on Lantus 12 units qhs and Novolog 6 units TID with meals; she reports compliance with this. She brings in her meter which unfortunately we are unable to download; from reviewing some of the recent readings on the meter, her AM CBGs are ranging from 139-250 and PM CBG are consistently in the mid 200s. Patient endorses noticing her sugar being elevated morning after a dietary indiscretion. In setting of her charcot foot boot and limited RUE movement due to fear, she has had difficulty with preparing meals and has had more indiscretions. Her HH aid will start coming again in the  next couple of days to help with preparing food.   Patient endorses significant loss of interest, fatigue, poor sleep, inc appetite, that started while she was in the hospital. She has a lot of people dependent on her including her children and grandchild, however states that she can rely and confide in her sisters who provide support.   Patient denies nausea, vomiting, syncope.  Please see problem based Assessment and Plan for status of patients chronic conditions.  Past Medical History:  Diagnosis Date  . Asthma    prn inhaler  . CAD S/P BMS PCI to prox LAD 03/31/2016   Ost LAD to Mid LAD lesion, 90% stenosed. Post intervention - Vision BMS 3.0 mm x 18 mm (~3.5 mm) there is a 0% residual stenosis.   . Charcot foot due to diabetes mellitus ([place]) 11/2016   left  . Depression   . Diabetic neuropathy (HCC)    feet  . H/O non-ST elevation myocardial infarction (NSTEMI) 03/2016   Found in 9% mid LAD lesion treated with bare-metal stent (BMS) PCI - vision BMS 3.0 mm x 18 mm  . History of MRSA infection   . Hyperlipidemia   . Hypertension    medication dose increased 11/22/2015; has been taking med. consistently since 03/2016, per pt.  . Insulin dependent diabetes mellitus ([place])   . Iron deficiency anemia    takes iron supplement  . Retinopathy of both eyes   . Sickle cell trait ([place])   . 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 RESP- Moving equal volumes of air, and clear to auscultation bilaterally, no wheezes or crackles. ABDOMEN- Soft, nontender, bowel sounds present. NEURO- No obvious  Cr N abnormality. EXTREMITIES- radial pulse 2+, symmetric, no pedal edema. 1+ pitting edema in bil LEs; RLE in charcot boot. SKIN- Warm, dry, no rash or lesion. Healing RUE incision without drainage, erythema, surrounding edema. PSYCH- flat affect.  Assessment & Plan:   See Encounters Tab for problem based charting.   Patient discussed with Dr. Verdie Drown, MD Internal Medicine PGY2

## 2017-08-11 NOTE — Assessment & Plan Note (Signed)
Patient hypertensive today, but has just now taken her medicines; she has been out of furosemide for 2 days which is likely contributing.  Plan: --continue furosemide 40mg  daily (patient to pick up today) --continue diltiazem 240mg  daily --continue carvedilol 25mg  BID --f/u in 1 month

## 2017-08-11 NOTE — Assessment & Plan Note (Signed)
Patient with recent symptomatic anemia during hospitalization to 7.8, which improved after 1 unit of PRBCs. She was also started on darbepoeitin alpha at the time by nephrology. Etiology is renal. Today she denies signs/symptoms of anemia. She has continued to take her megace which controls her previous menorrhagia.  Plan: --will f/u next month with renal for repeat labs

## 2017-08-11 NOTE — Assessment & Plan Note (Signed)
Patient was discharged on Lantus 12 units qhs and Novolog 6 units TID with meals; she reports compliance with this. She brings in her meter which unfortunately we are unable to download; from reviewing some of the recent readings on the meter, her AM CBGs are ranging from 139-250 and PM CBG are consistently in the mid 200s. Patient endorses noticing her sugar being elevated morning after a dietary indiscretion. In setting of her charcot foot boot and limited RUE movement due to fear, she has had difficulty with preparing meals and has had more indiscretions. Her HH aid will start coming again in the next couple of days to help with preparing food.   Plan: --inc lantus to 15 units qhs; continue novolog 6 units TID wc --asked patient to continue checking CBGs and keep paper log as well --encouraged her to evaluate how her sugars are affected by her diet choices and try to adjust as able  --advised to call or rtc if experiencing hypoglycemic episodes and instructed on proper management of those --f/u in 4 weeks with glucose log for further adjustment

## 2017-08-11 NOTE — Assessment & Plan Note (Signed)
Refilled megace. Patient to see her OB/Gyn next month to discuss possible discontinuation.

## 2017-08-11 NOTE — Assessment & Plan Note (Signed)
Patient with rapidly progressive CKD now stage 5; renal biopsy reveals diabetic nephropathy.

## 2017-08-16 MED FILL — CARVEDILOL 25 MG TABLET: 25 | 30 days supply | Qty: 60 | Fill #1

## 2017-08-29 ENCOUNTER — Ambulatory Visit (INDEPENDENT_AMBULATORY_CARE_PROVIDER_SITE_OTHER): Payer: Medicaid Other | Admitting: Clinical

## 2017-08-29 ENCOUNTER — Encounter: Payer: Self-pay | Admitting: Obstetrics & Gynecology

## 2017-08-29 ENCOUNTER — Other Ambulatory Visit (HOSPITAL_COMMUNITY)
Admission: RE | Admit: 2017-08-29 | Discharge: 2017-08-29 | Disposition: A | Discharge status: Home / Self Care | Payer: Medicaid Other | Source: Ambulatory Visit | Attending: Obstetrics & Gynecology | Admitting: Obstetrics & Gynecology

## 2017-08-29 ENCOUNTER — Ambulatory Visit (INDEPENDENT_AMBULATORY_CARE_PROVIDER_SITE_OTHER): Payer: Medicaid Other | Admitting: Obstetrics & Gynecology

## 2017-08-29 VITALS — BP 131/75 | HR 88 | Wt 219.0 lb

## 2017-08-29 DIAGNOSIS — F322 Major depressive disorder, single episode, severe without psychotic features: Secondary | ICD-10-CM

## 2017-08-29 DIAGNOSIS — Z0000 Encounter for general adult medical examination without abnormal findings: Secondary | ICD-10-CM

## 2017-08-29 DIAGNOSIS — Z23 Encounter for immunization: Secondary | ICD-10-CM

## 2017-08-29 DIAGNOSIS — N939 Abnormal uterine and vaginal bleeding, unspecified: Secondary | ICD-10-CM

## 2017-08-29 DIAGNOSIS — Z01419 Encounter for gynecological examination (general) (routine) without abnormal findings: Secondary | ICD-10-CM | POA: Diagnosis not present

## 2017-08-29 DIAGNOSIS — Z113 Encounter for screening for infections with a predominantly sexual mode of transmission: Secondary | ICD-10-CM

## 2017-08-29 DIAGNOSIS — Z79899 Other long term (current) drug therapy: Secondary | ICD-10-CM

## 2017-08-29 LAB — POCT PREGNANCY, URINE: PREG TEST UR: NEGATIVE

## 2017-08-29 MED ORDER — MEGESTROL ACETATE 40 MG PO TABS: 40.0000 mg | ORAL_TABLET | Freq: Every day | ORAL | 12 refills | Status: DC

## 2017-08-29 NOTE — Patient Instructions (Signed)
Abnormal Uterine Bleeding Abnormal uterine bleeding can affect women at various stages in life, including teenagers, women in their reproductive years, pregnant women, and women who have reached menopause. Several kinds of uterine bleeding are considered abnormal, including:  Bleeding or spotting between periods.  Bleeding after sexual intercourse.  Bleeding that is heavier or more than normal.  Periods that last longer than usual.  Bleeding after menopause. Many cases of abnormal uterine bleeding are minor and simple to treat, while others are more serious. Any type of abnormal bleeding should be evaluated by your health care provider. Treatment will depend on the cause of the bleeding. Follow these instructions at home: Monitor your condition for any changes. The following actions may help to alleviate any discomfort you are experiencing:  Avoid the use of tampons and douches as directed by your health care provider.  Change your pads frequently. You should get regular pelvic exams and Pap tests. Keep all follow-up appointments for diagnostic tests as directed by your health care provider. Contact a health care provider if:  Your bleeding lasts more than 1 week.  You feel dizzy at times. Get help right away if:  You pass out.  You are changing pads every 15 to 30 minutes.  You have abdominal pain.  You have a fever.  You become sweaty or weak.  You are passing large blood clots from the vagina.  You start to feel nauseous and vomit. This information is not intended to replace advice given to you by your health care provider. Make sure you discuss any questions you have with your health care provider. Document Released: 10/31/2005 Document Revised: 04/13/2016 Document Reviewed: 05/30/2013 Elsevier Interactive Patient Education  2017 Elsevier Inc.  

## 2017-08-29 NOTE — Progress Notes (Signed)
Pt stated having spotting that last for 4 days.

## 2017-08-29 NOTE — Progress Notes (Signed)
Subjective:     Jane [name] is a 41 y.o. female here for a routine exam.  Current complaints: pt reports sx of depression she is currently in treatment   Pt is on Zoloft. Has only been on meds for 2 weeks and reports only min benefits at present. She denies GYN complaints.    Gynecologic History No LMP recorded. Patient is not currently having periods (Reason: Perimenopausal). Contraception: Megace and BTL  Last XBL:[id number] . No records in chart of prev PAP Last mammogram: 07/11/2017 . Results were: normal  Obstetric History OB History  Gravida Para Term Preterm AB Living  2 2 1 1  0 2  SAB TAB Ectopic Multiple Live Births  0 0 0 0      # Outcome Date GA Lbr Len/2nd Weight Sex Delivery Anes PTL Lv  2 Preterm           1 Term              The following portions of the patient's history were reviewed and updated as appropriate: allergies, current medications, past family history, past medical history, past social history, past surgical history and problem list.  Review of Systems Pertinent items are noted in HPI.    Objective:  BP 131/75   Pulse 88   Wt 219 lb (99.3 kg)   BMI 36.44 kg/m   General Appearance:    Alert, cooperative, no distress, appears stated age  Head:    Normocephalic, without obvious abnormality, atraumatic  Eyes:    conjunctiva/corneas clear, EOM's intact, both eyes  Ears:    Normal external ear canals, both ears  Nose:   Nares normal, septum midline, mucosa normal, no drainage    or sinus tenderness  Throat:   Lips, mucosa, and tongue normal; teeth and gums normal  Neck:   Supple, symmetrical, trachea midline, no adenopathy;    thyroid:  no enlargement/tenderness/nodules  Back:     Symmetric, no curvature, ROM normal, no CVA tenderness  Lungs:     Clear to auscultation bilaterally, respirations unlabored  Chest Wall:    No tenderness or deformity   Heart:    Regular rate and rhythm, S1 and S2 normal, no murmur, rub   or gallop  Breast Exam:    No  tenderness, masses, or nipple abnormality  Abdomen:     Soft, non-tender, bowel sounds active all four quadrants,    no masses, no organomegaly  Genitalia:    Normal female without lesion, discharge or tenderness     Extremities:   Extremities normal, atraumatic, no cyanosis or edema  Pulses:   2+ and symmetric all extremities  Skin:   Skin color, texture, turgor normal, no rashes or lesions    Assessment:    Healthy female exam.   AUB- improved on Megace STI screen     Plan:    Follow up in: 1 year.    Referral to behavioral health  In ofc F/u PAP and cx Labs: HIV, RPR, hep screen refilled Megace 40mg  daily  [name] L. Harraway-Smith, M.D., [name]
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
## 2017-08-29 NOTE — Progress Notes (Signed)
Patient verbally consented to meet with [facility] Clinician about presenting concerns. Dr Ihor Dow aware

## 2017-08-30 ENCOUNTER — Encounter: Payer: Self-pay | Admitting: Vascular Surgery

## 2017-08-30 ENCOUNTER — Other Ambulatory Visit (HOSPITAL_COMMUNITY): Payer: Self-pay

## 2017-08-30 ENCOUNTER — Encounter (HOSPITAL_COMMUNITY): Payer: Self-pay

## 2017-08-30 LAB — RPR: RPR Ser Ql: NONREACTIVE

## 2017-08-30 LAB — HEPATITIS C ANTIBODY: Hep C Virus Ab: <0.1 s/co ratio (ref 0.0–0.9)

## 2017-08-30 LAB — HIV ANTIBODY (ROUTINE TESTING W REFLEX): HIV Screen 4th Generation wRfx: NONREACTIVE

## 2017-08-30 LAB — HEPATITIS B SURFACE ANTIGEN: Hepatitis B Surface Ag: NEGATIVE

## 2017-08-31 LAB — CYTOLOGY - PAP
CHLAMYDIA, DNA PROBE: NEGATIVE
DIAGNOSIS: NEGATIVE
HPV (WINDOPATH): NOT DETECTED
Neisseria Gonorrhea: NEGATIVE
Trichomonas: NEGATIVE

## 2017-09-07 MED FILL — FUROSEMIDE 40 MG TABLET: 40 | 30 days supply | Qty: 30 | Fill #2

## 2017-09-07 MED FILL — MEGESTROL 40 MG TABLET: 40 | 30 days supply | Qty: 30 | Fill #0

## 2017-09-07 MED FILL — CLOPIDOGREL 75 MG TABLET: 75 | 30 days supply | Qty: 30 | Fill #2

## 2017-09-12 NOTE — Progress Notes (Signed)
CC: depression, diabetes  HPI:  Jane [name] is a 41 y.o. 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  HTN: Patient states compliance with carvedilol 25mg  BID, diltiazem 240mg  daily and lasix 40mg  daily. She denies chest pain; she has some exertional shortness of breath only attributed to largely sedentary lifestyle. She endorses stable orthopnea at ~30 degrees for the last year. She denies LE swelling  MDD: Patient states that since starting zoloft 50mg  daily, she has noticed better sleep, some increase in energy and overall feeling a little better. She still states her concentration is low, which she attributes to not really having much to concentrate on as she is at home, alone most of the time. She has found that taking it at night helps her sleep and doesn't leave her as fatigued during the rest of the day.  T2DM: Patient states that she has forgot to increase her Lantus from 12 units to 15, but has been consistent with taking it nightly along with the mealtime novolog 6 units. She states she has slacked a little more the last couple of weeks on checking her sugars. She denies further hypoglycemic episodes which she is excited about. She has been able to obtain a [employer] aide to assist with breakfast and lunch meals, however she is still on her own with dinner. She was able to fix her meter and brought it in for review - her morning and early afternoon CBGs are 150-180 on average, however her nighttime CBGs are averaging 200-250. Patient states this is consistent with her food choices. She acknowledges that her CBGs would be better controlled with just diet changes and that it is in her power to make those adjustments despite her limited mobility due to charcot foot boot at this time.   Please see problem based Assessment and Plan for status of patients chronic  conditions.  Past Medical History:  Diagnosis Date  . Asthma    prn inhaler  . CAD S/P BMS PCI to prox LAD 03/31/2016   Ost LAD to Mid LAD lesion, 90% stenosed. Post intervention - Vision BMS 3.0 mm x 18 mm (~3.5 mm) there is a 0% residual stenosis.   . Charcot foot due to diabetes mellitus ([place]) 11/2016   left  . Depression   . Diabetic neuropathy (HCC)    feet  . H/O non-ST elevation myocardial infarction (NSTEMI) 03/2016   Found in 9% mid LAD lesion treated with bare-metal stent (BMS) PCI - vision BMS 3.0 mm x 18 mm  . History of MRSA infection   . Hyperlipidemia   . Hypertension    medication dose increased 11/22/2015; has been taking med. consistently since 03/2016, per pt.  . Insulin dependent diabetes mellitus ([place])   . Iron deficiency anemia    takes iron supplement  . Retinopathy of both eyes   . Sickle cell trait ([place])   . 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 HEENT- Atraumatic, normocephalic, EOMI, oral mucosa appears moist  CARDIAC- RRR, no murmurs, rubs or gallops. RESP- Moving equal volumes of air, and clear to auscultation bilaterally, no wheezes or crackles. ABDOMEN- Soft, nontender, bowel sounds present. NEURO- No obvious Cr N abnormality. EXTREMITIES- pulse 2+ DP on right; left LE in boot. Minimal LE edema bilaterally SKIN- Warm, dry, no rash or lesion. PSYCH- Improved and appropriate mood and affect from last visit   Assessment & Plan:   See Encounters Tab for problem based charting.   Patient discussed with Dr. Moshe Cipro, MD Internal Medicine PGY2

## 2017-09-13 ENCOUNTER — Ambulatory Visit (INDEPENDENT_AMBULATORY_CARE_PROVIDER_SITE_OTHER): Payer: Medicaid Other | Admitting: Internal Medicine

## 2017-09-13 ENCOUNTER — Encounter: Payer: Self-pay | Admitting: Internal Medicine

## 2017-09-13 VITALS — BP 133/65 | HR 85 | Temp 99.5°F | Ht 65.0 in | Wt 230.8 lb

## 2017-09-13 DIAGNOSIS — I120 Hypertensive chronic kidney disease with stage 5 chronic kidney disease or end stage renal disease: Secondary | ICD-10-CM

## 2017-09-13 DIAGNOSIS — E1122 Type 2 diabetes mellitus with diabetic chronic kidney disease: Secondary | ICD-10-CM | POA: Diagnosis not present

## 2017-09-13 DIAGNOSIS — E113493 Type 2 diabetes mellitus with severe nonproliferative diabetic retinopathy without macular edema, bilateral: Secondary | ICD-10-CM | POA: Diagnosis not present

## 2017-09-13 DIAGNOSIS — E785 Hyperlipidemia, unspecified: Secondary | ICD-10-CM

## 2017-09-13 DIAGNOSIS — Z794 Long term (current) use of insulin: Secondary | ICD-10-CM | POA: Diagnosis not present

## 2017-09-13 DIAGNOSIS — R0601 Orthopnea: Secondary | ICD-10-CM | POA: Diagnosis not present

## 2017-09-13 DIAGNOSIS — E11610 Type 2 diabetes mellitus with diabetic neuropathic arthropathy: Secondary | ICD-10-CM | POA: Diagnosis not present

## 2017-09-13 DIAGNOSIS — N186 End stage renal disease: Secondary | ICD-10-CM | POA: Diagnosis not present

## 2017-09-13 DIAGNOSIS — Z79899 Other long term (current) drug therapy: Secondary | ICD-10-CM | POA: Diagnosis not present

## 2017-09-13 DIAGNOSIS — I2510 Atherosclerotic heart disease of native coronary artery without angina pectoris: Secondary | ICD-10-CM

## 2017-09-13 DIAGNOSIS — F322 Major depressive disorder, single episode, severe without psychotic features: Secondary | ICD-10-CM

## 2017-09-13 DIAGNOSIS — I10 Essential (primary) hypertension: Secondary | ICD-10-CM

## 2017-09-13 DIAGNOSIS — F339 Major depressive disorder, recurrent, unspecified: Secondary | ICD-10-CM

## 2017-09-13 DIAGNOSIS — Z955 Presence of coronary angioplasty implant and graft: Secondary | ICD-10-CM | POA: Diagnosis not present

## 2017-09-13 MED ORDER — SERTRALINE HCL 100 MG PO TABS: 50.0000 mg | ORAL_TABLET | Freq: Every day | ORAL | 2 refills | Status: DC

## 2017-09-13 NOTE — Progress Notes (Signed)
Internal Medicine Clinic Attending  Case discussed with Dr. Jari Favre  at the time of the visit.  We reviewed the resident's history and exam and pertinent patient test results.  I agree with the assessment, diagnosis, and plan of care documented in the resident's note.  Oda Kilts, MD

## 2017-09-13 NOTE — Assessment & Plan Note (Signed)
Improving. Patient states that since starting zoloft 50mg  daily, she has noticed better sleep, some increase in energy and overall feeling a little better. She still states her concentration is low, which she attributes to not really having much to concentrate on as she is at home, alone most of the time. She has found that taking it at night helps her sleep and doesn't leave her as fatigued during the rest of the day.  Plan: --increase zoloft to 100mg  daily --f/u in 2 months

## 2017-09-13 NOTE — Assessment & Plan Note (Signed)
BP well controlled on current regimen: lasix 40mg  daily, diltiazem 240mg  daily and carvedilol 25mg  BID.   Plan: --continue current regimen

## 2017-09-13 NOTE — Patient Instructions (Addendum)
For your diabetes: --keep taking Lantus 12 units --keep taking Novolog 6 units three times daily with meals --as you can, try to make keep white bread, rice, pasta, sweets out of your diet and focus on lean meats (chicken) that is baked or boiled and veggies. --try to check your sugars first thing in the morning and 2-3 times throughout the day  For your depression, we'll increase your zoloft to 100mg  daily.

## 2017-09-13 NOTE — Assessment & Plan Note (Signed)
Morning CBGs relatively well controlled - could be lower but mostly reflective of her evening food choices. She has not had further hypoglycemic episodes (which were relatively frequent for her) which is a great improvement.  Plan: --Continue Lantus 12 units qhs, novolog 6 units TID wc --Patient acknowledges poor food choices mostly at night and states she is able to improve this despite her limited mobility at this time --Encouraged patient to continue checking her CBGs 3-4 times throughout the day.  --f/u in 2 months

## 2017-09-14 NOTE — Progress Notes (Signed)
Postoperative Access Visit   History of Present Illness   KAELEEN ODOM is a 41 y.o. year old female who presents for postoperative follow-up for: right brachiocephalic arteriovenous fistula (Date: 07/31/17).  The patient's wounds are healed.  The patient notes steal symptoms.  The patient is able to complete their activities of daily living.  The patient's current symptoms are: none.  Past Medical History:  Diagnosis Date  . Asthma    prn inhaler  . CAD S/P BMS PCI to prox LAD 03/31/2016   Ost LAD to Mid LAD lesion, 90% stenosed. Post intervention - Vision BMS 3.0 mm x 18 mm (~3.5 mm) there is a 0% residual stenosis.   . Charcot foot due to diabetes mellitus ([place]) 11/2016   left  . Depression   . Diabetic neuropathy (HCC)    feet  . H/O non-ST elevation myocardial infarction (NSTEMI) 03/2016   Found in 9% mid LAD lesion treated with bare-metal stent (BMS) PCI - vision BMS 3.0 mm x 18 mm  . History of MRSA infection   . Hyperlipidemia   . Hypertension    medication dose increased 11/22/2015; has been taking med. consistently since 03/2016, per pt.  . Insulin dependent diabetes mellitus ([place])   . Iron deficiency anemia    takes iron supplement  . Retinopathy of both eyes   . Sickle cell trait ([place])   . 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 ACHILLES TENDON LENGTHENING Left 11/24/2016   Procedure: Left Achilles tendon lengthening (open);  Surgeon: Wylene Simmer, MD;  Location: [facility];  Service: Orthopedics;  Laterality: Left;  . AV FISTULA PLACEMENT Right 07/31/2017   Procedure: ARTERIOVENOUS BRACHIOCEPHALIC FISTULA CREATION RIGHT ARM;  Surgeon: Conrad Knightsen, MD;  Location: [facility];  Service: Vascular;  Laterality: Right;  . CALCANEAL OSTEOTOMY Left 11/24/2016   Procedure: Left hindfoot osteotomy and fusion;  Surgeon: Wylene Simmer, MD;  Location: [facility];  Service: Orthopedics;   Laterality: Left;  . CARDIAC CATHETERIZATION N/A 03/31/2016   Procedure: Left Heart Cath and Coronary Angiography;  Surgeon: Lorretta Harp, MD;  Location: [facility] INVASIVE CV LAB: 90% early mLAD, normal LV Fxn  . CARDIAC CATHETERIZATION N/A 03/31/2016   Procedure: Coronary Stent Intervention;  Surgeon: Lorretta Harp, MD;  Location: [facility] CV LAB;  Service: Cardiovascular: PCI to mLAD BMS Vision 3.0 mm x 18 mm  . CESAREAN SECTION  1997  . TRANSTHORACIC ECHOCARDIOGRAM  03/2016   Normal LV size and thickness. EF 60-65%. GR 1 DD. Otherwise essentially normal.  . [place]    Social History   Social History  . Marital status: Single    Spouse name: N/A  . Number of children: 2  . Years of education: 13   Occupational History  .   [employer]   Social History Main Topics  . Smoking status: Former Smoker    Packs/day: 0.00    Years: 0.00    Quit date: 08/29/2015  . Smokeless tobacco: Never Used  . Alcohol use No  . Drug use: No  . Sexual activity: Yes    Birth control/ protection: Surgical   Other Topics Concern  . Not on file   Social History Narrative   Patient does not drink caffeine.   Patient is right handed.      Family History  Problem Relation Age of Onset  . Stroke Mother   .  Diabetes Mother   . Hypertension Mother   . Aneurysm Mother   . Diabetes Father   . Hypertension Father   . Diabetes Sister   . Hypertension Sister   . Diabetes Maternal Grandmother   . Diabetes Maternal Grandfather   . Cancer Paternal Grandfather        Prostate    Current Outpatient Prescriptions  Medication Sig Dispense Refill  . ACCU-CHEK FASTCLIX LANCETS MISC Check blood sugar 3x a day, diag code E11.3493, insulin dependent 306 each 1  . acetaminophen (TYLENOL) 325 MG tablet Take 650 mg by mouth every 6 (six) hours as needed for mild pain.     Marland Kitchen atorvastatin (LIPITOR) 40 MG tablet Take 1 tablet (40 mg total) by mouth daily. 30 tablet 11  . Blood  Glucose Monitoring Suppl (ACCU-CHEK GUIDE) w/Device KIT 1 each by Does not apply route 3 (three) times daily. 1 kit 0  . carvedilol (COREG) 25 MG tablet Take 1 tablet (25 mg total) by mouth 2 (two) times daily with a meal. 60 tablet 3  . diltiazem (CARDIZEM CD) 240 MG 24 hr capsule Take 1 capsule (240 mg total) by mouth daily. 30 capsule 3  . ferrous sulfate 325 (65 FE) MG tablet TAKE 1 Tablet BY MOUTH EVERY MORNING WITH BREAKFAST 90 tablet 3  . furosemide (LASIX) 40 MG tablet Take 1 tablet (40 mg total) by mouth daily. 30 tablet 2  . glucose blood (ACCU-CHEK GUIDE) test strip Check blood sugar 3x a day  as instructed 100 each 12  . glucose blood test strip Use as instructed 100 each 12  . insulin aspart (NOVOLOG) 100 UNIT/ML injection Inject 6 Units into the skin 3 (three) times daily with meals. 10 mL 11  . insulin glargine (LANTUS) 100 UNIT/ML injection Inject 0.12 mLs (12 Units total) into the skin at bedtime. 10 mL 11  . Insulin Syringe-Needle U-100 31G X 15/64" 0.3 ML MISC Use to inject insulin three times a day 100 each 12  . megestrol (MEGACE) 40 MG tablet Take 1 tablet (40 mg total) by mouth daily. Internal Medicine Program 30 tablet 12  . PROVENTIL HFA 108 (90 Base) MCG/ACT inhaler INHALE 2 PUFFS BY MOUTH EVERY 4 HOURS AS NEEDED FOR COUGHING, WHEEZING, OR SHORTNESS OF BREATH 20.1 g 2  . sertraline (ZOLOFT) 100 MG tablet Take 0.5 tablets (50 mg total) by mouth daily. 30 tablet 2  . oxyCODONE-acetaminophen (PERCOCET/ROXICET) 5-325 MG tablet Take 1 tablet by mouth every 8 (eight) hours as needed for severe pain. (Patient not taking: Reported on 09/15/2017) 6 tablet 0   No current facility-administered medications for this visit.      Allergies  Allergen Reactions  . Adhesive [Tape] Other (See Comments)    Irritation     REVIEW OF SYSTEMS (negative unless checked):   Cardiac:  '[]'$  Chest pain or chest pressure? '[]'$  Shortness of breath upon activity? '[]'$  Shortness of breath when lying  flat? '[]'$  Irregular heart rhythm?  Vascular:  '[]'$  Pain in calf, thigh, or hip brought on by walking? '[]'$  Pain in feet at night that wakes you up from your sleep? '[]'$  Blood clot in your veins? '[]'$  Leg swelling?  Pulmonary:  '[]'$  Oxygen at home? '[]'$  Productive cough? '[]'$  Wheezing?  Neurologic:  '[]'$  Sudden weakness in arms or legs? '[]'$  Sudden numbness in arms or legs? '[]'$  Sudden onset of difficult speaking or slurred speech? '[]'$  Temporary loss of vision in one eye? '[]'$  Problems with dizziness?  Gastrointestinal:  '[]'$   Blood in stool? '[]'$  Vomited blood?  Genitourinary:  '[]'$  Burning when urinating? '[]'$  Blood in urine?  Psychiatric:  '[]'$  Major depression  Hematologic:  '[]'$  Bleeding problems? '[]'$  Problems with blood clotting?  Dermatologic:  '[]'$  Rashes or ulcers?  Constitutional:  '[]'$  Fever or chills?  Ear/Nose/Throat:  '[]'$  Change in hearing? '[]'$  Nose bleeds? '[]'$  Sore throat?  Musculoskeletal:  '[]'$  Back pain? '[]'$  Joint pain? '[]'$  Muscle pain?  Physical Examination   Vitals:   09/15/17 0819  BP: 120/73  Pulse: 84  Resp: 16  Temp: 98.9 F (37.2 C)  SpO2: 100%  Weight: 230 lb (104.3 kg)  Height: '5\' 5"'$  (1.651 m)   Body mass index is 38.27 kg/m.  Pulmonary Sym exp, good air movt, CTAB, no rales, rhonchi, & wheezing  Cardiac RRR, Nl S1, S2, no Murmurs, rubs or gallops   right arm Incision is healed, skin feels warm, hand grip is 5/5, sensation in digits is intact, palpable thrill, bruit can be auscultated, fistula appears to be 7-16 mm deep    Medical Decision Making   MARGARETHA MAHAN is a 41 y.o. year old female who presents s/p right brachiocephalic arteriovenous fistula   The patient's access is NOT ready for use.  It will need superficialization to make it useable.  I recommended: superficialization R BC AVF. Risk, benefits, and alternatives to access surgery were discussed.   The patient is aware the risks include but are not limited to: bleeding, infection, steal  syndrome, nerve damage, ischemic monomelic neuropathy, thrombosis, failure to mature, complications related to venous hypertension, need for additional procedures, death and stroke.   The patient agrees to proceed forward with the procedure.  She is scheduled for 8 NOV 18  Thank you for allowing [place] to participate in this patient's care.   Adele Barthel, MD, FACS [facility] Office: [phone] Pager: [phone]

## 2017-09-15 ENCOUNTER — Other Ambulatory Visit: Payer: Self-pay | Admitting: *Deleted

## 2017-09-15 ENCOUNTER — Telehealth: Payer: Self-pay | Admitting: *Deleted

## 2017-09-15 ENCOUNTER — Ambulatory Visit (INDEPENDENT_AMBULATORY_CARE_PROVIDER_SITE_OTHER): Payer: Self-pay | Admitting: Vascular Surgery

## 2017-09-15 ENCOUNTER — Encounter: Payer: Self-pay | Admitting: Vascular Surgery

## 2017-09-15 ENCOUNTER — Encounter: Payer: Self-pay | Admitting: *Deleted

## 2017-09-15 VITALS — BP 120/73 | HR 84 | Temp 98.9°F | Resp 16 | Ht 65.0 in | Wt 230.0 lb

## 2017-09-15 DIAGNOSIS — N170 Acute kidney failure with tubular necrosis: Secondary | ICD-10-CM

## 2017-09-15 DIAGNOSIS — N183 Chronic kidney disease, stage 3 unspecified: Secondary | ICD-10-CM

## 2017-09-15 DIAGNOSIS — F322 Major depressive disorder, single episode, severe without psychotic features: Secondary | ICD-10-CM

## 2017-09-15 MED ORDER — SERTRALINE HCL 100 MG PO TABS: 100.0000 mg | ORAL_TABLET | Freq: Every day | ORAL | 2 refills | Status: DC

## 2017-09-15 MED FILL — SERTRALINE HCL 100 MG TAB: 100 | 30 days supply | Qty: 30 | Fill #0

## 2017-09-15 NOTE — Telephone Encounter (Signed)
Patient informed that new rx has been sent.

## 2017-09-15 NOTE — Telephone Encounter (Signed)
Patient called stating that at her last OV she was instructed to take 100 mg zoloft daily but the instructions on the bottle says 50 mg daily. Ordered 09/13/17 #30, 2 refills (50 mg daily). Patient wants to clarify the dose. If you agree please send new rx w/ new instructions. Thanks!

## 2017-09-15 NOTE — Progress Notes (Signed)
Patient verbalized understanding to Stay on Plavix till surgery, per Dr. Bridgett Larsson.

## 2017-09-20 LAB — RENAL FUNCTION PANEL
Albumin: 3.6 g/dL (ref 3.5–5.5)
BUN: 70 mg/dL — AB (ref 4–21)
CO2: 22 mmol/L (ref 20–29)
CREATINE, SERUM: 5.45 mg/dL — AB (ref 0.57–1.00)
Calcium: 8.8 mg/dL (ref 8.7–10.2)
Chloride: 112 mmol/L (ref 96–106)
GFR CALC AF AMER: 10 — AB
GFR CALC NON AF AMER: 9 — AB
Glucose: 182 mg/dL — ABNORMAL HIGH
PHOSPHORUS: 4.2 mg/dL (ref 2.5–4.5)
Potassium: 5.4 mmol/L (ref 3.5–5.2)
SODIUM: 144 mmol/L (ref 134–144)

## 2017-09-22 ENCOUNTER — Encounter: Payer: Self-pay | Admitting: Vascular Surgery

## 2017-09-26 MED FILL — CARVEDILOL 25 MG TABLET: 25 | 30 days supply | Qty: 60 | Fill #2

## 2017-09-27 IMAGING — CR DG CHEST 2V
2 series · 2 of 2 positions shown · non-contrast
Comparison: Radiograph dated 11/09/2015

CLINICAL DATA: 3-year-old female with chest pain cough and
shortness of breath

EXAM:
CHEST  2 VIEW

[chest pa]
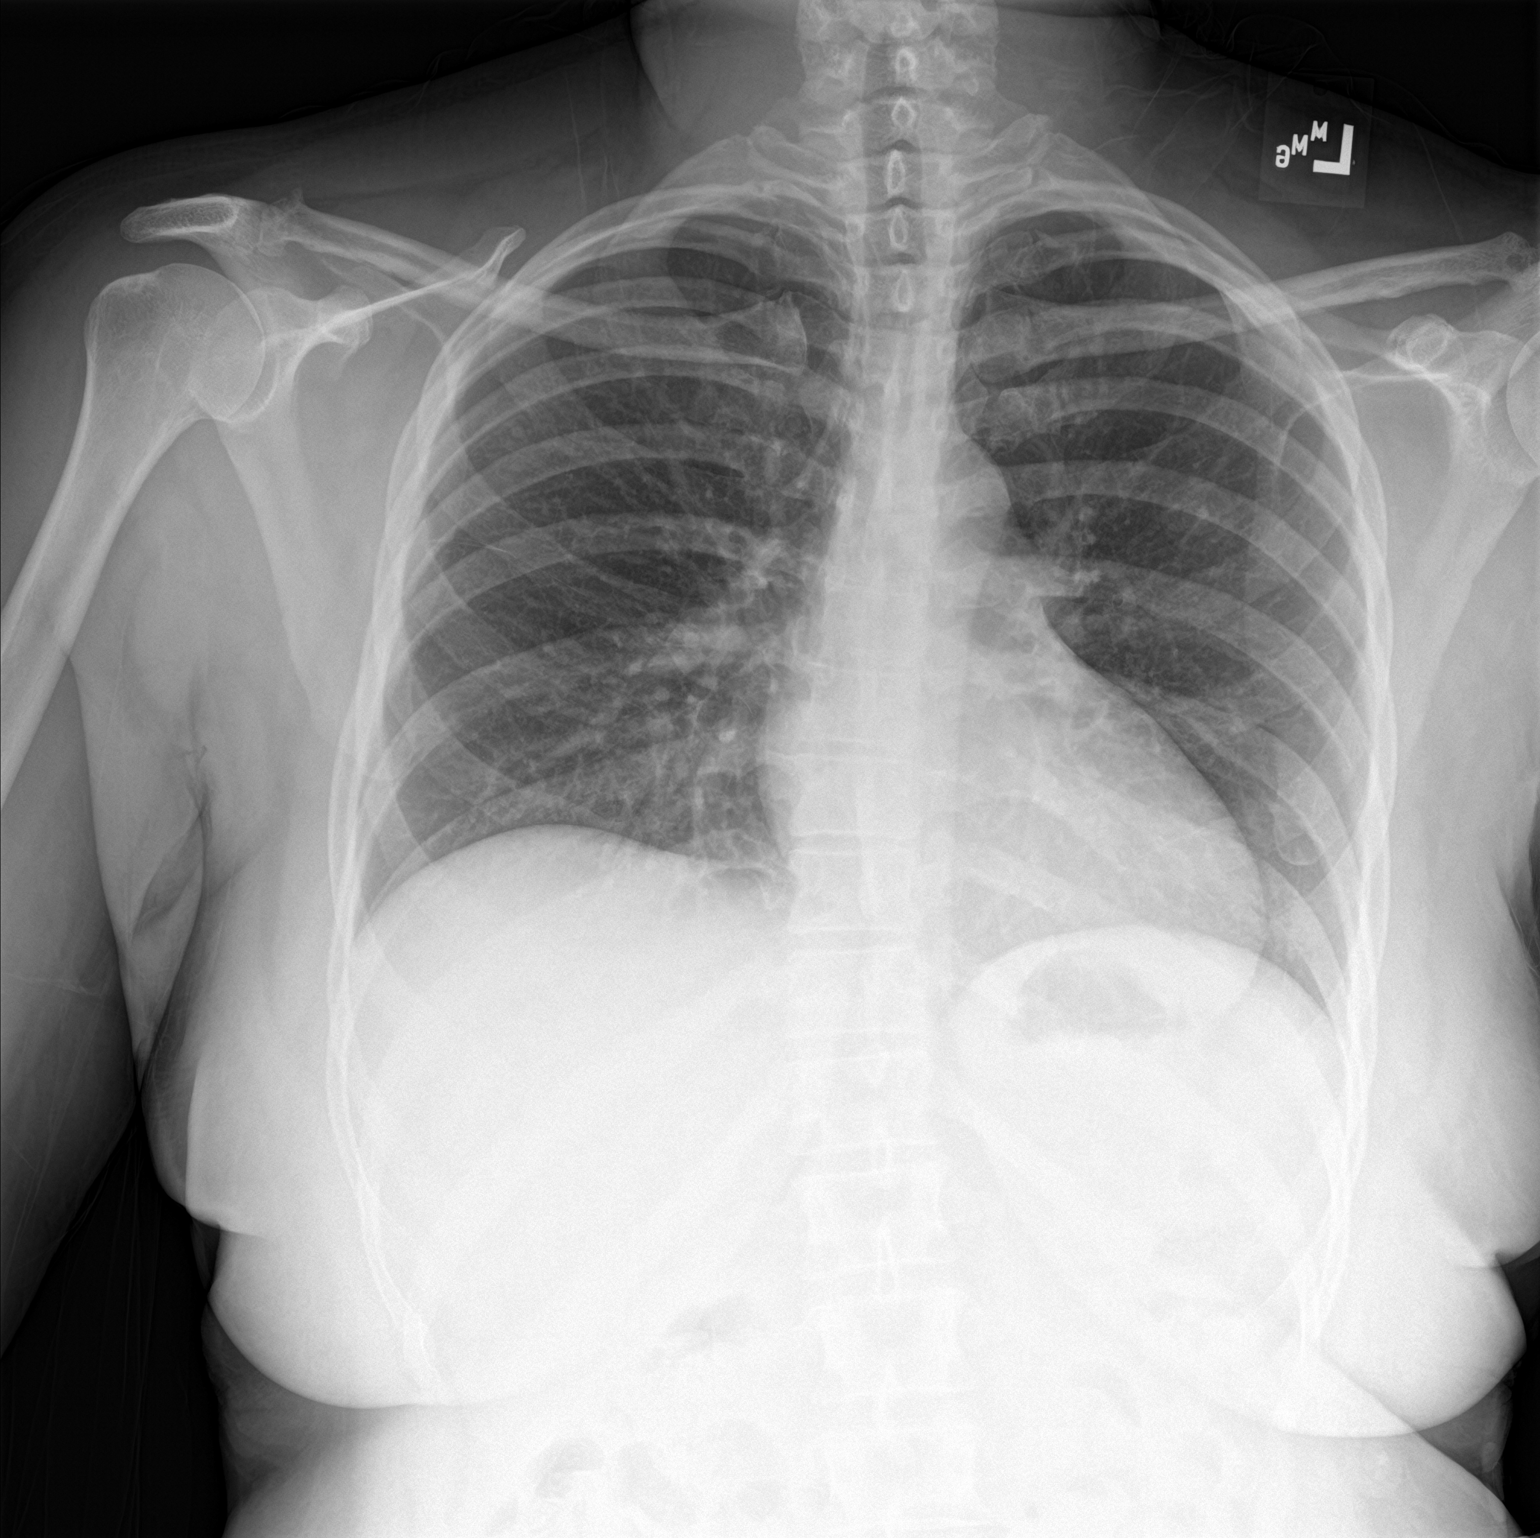

[chest lat]
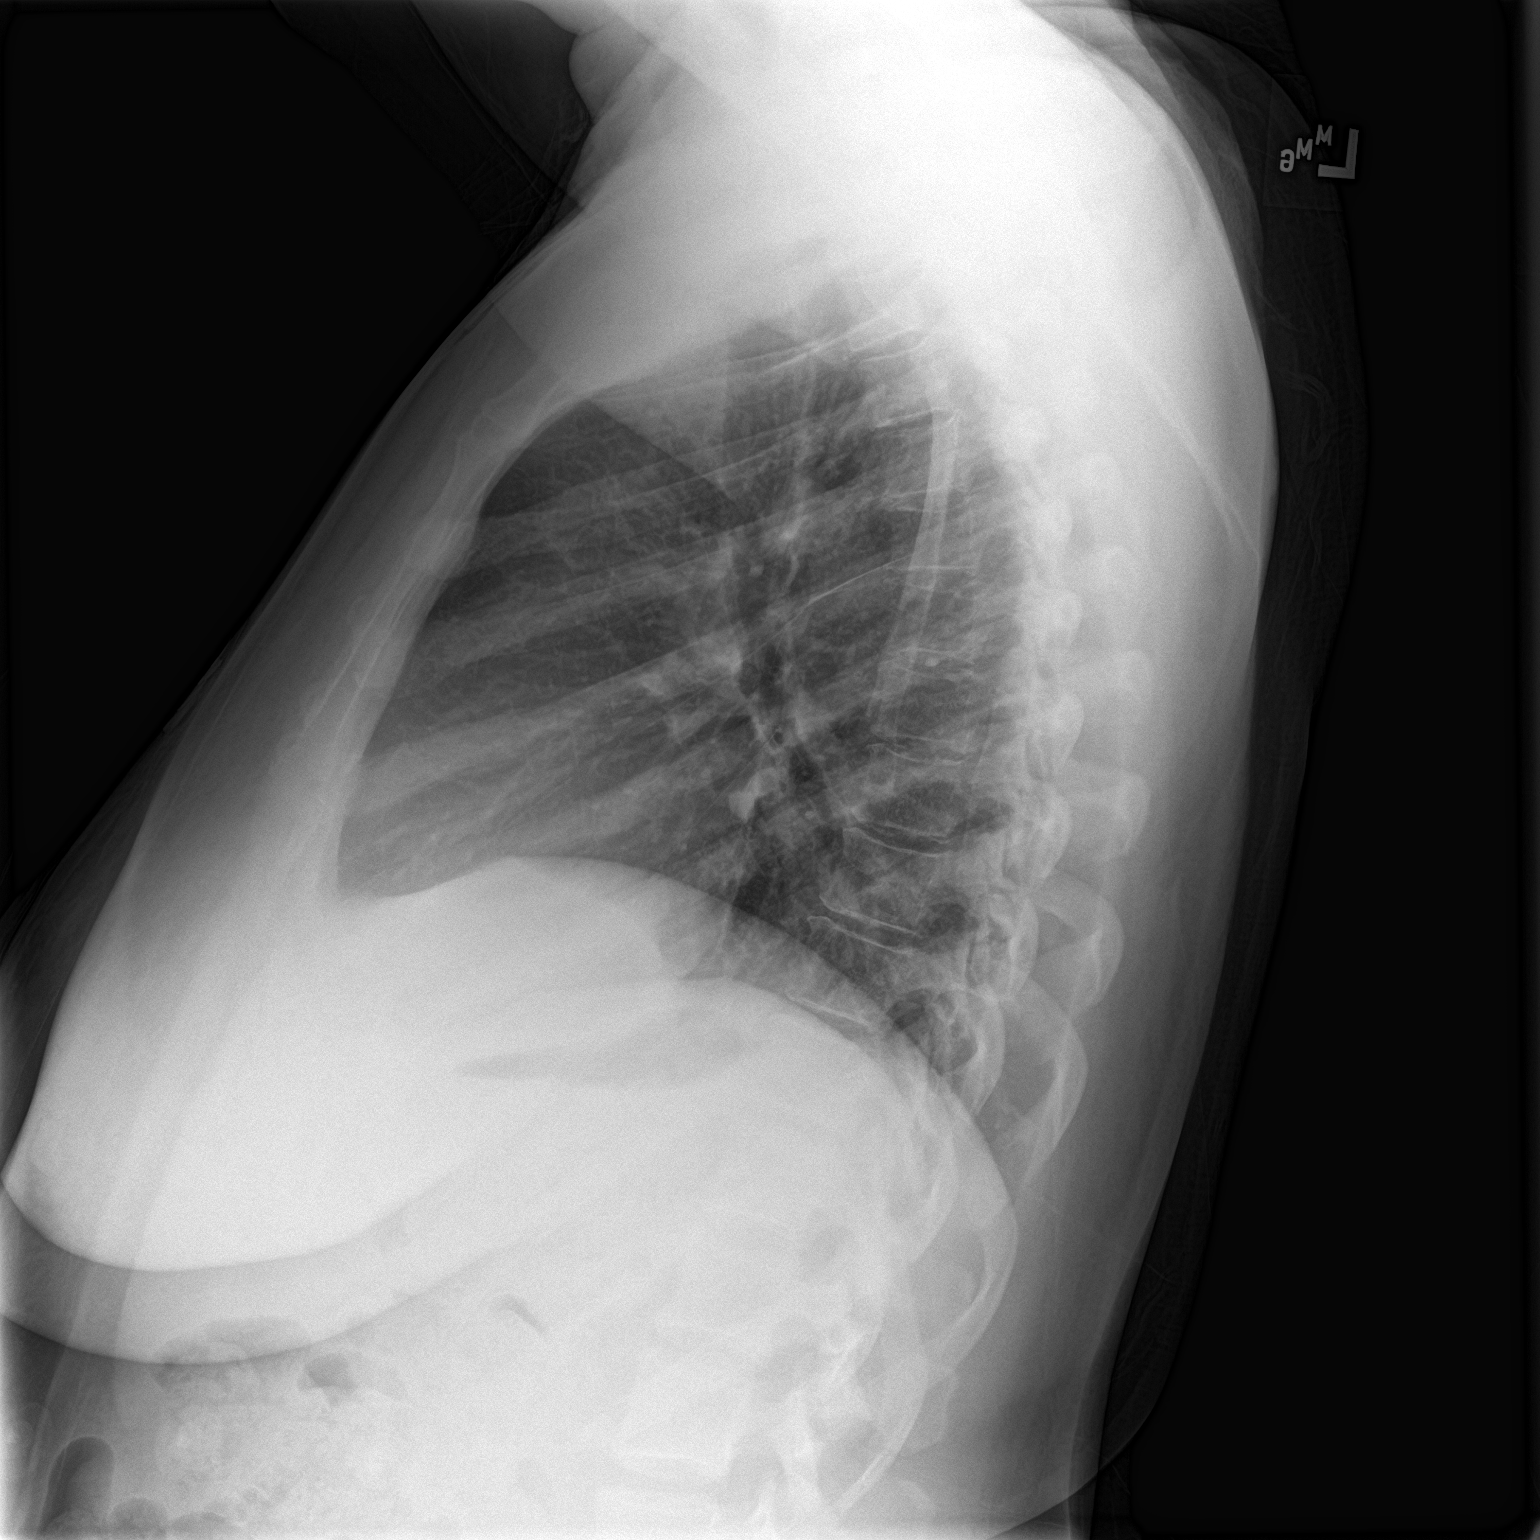

[2 of 2 positions shown; findings below may reference images not displayed]

FINDINGS: The heart size and mediastinal contours are within normal limits.
Both lungs are clear. The visualized skeletal structures are
unremarkable.
IMPRESSION: No active cardiopulmonary disease.

## 2017-09-29 NOTE — Discharge Instructions (Signed)

## 2017-10-02 ENCOUNTER — Encounter (HOSPITAL_COMMUNITY): Payer: Self-pay

## 2017-10-02 ENCOUNTER — Inpatient Hospital Stay (HOSPITAL_COMMUNITY)
Admission: RE | Admit: 2017-10-02 | Discharge: 2017-10-02 | Disposition: A | Discharge status: Home / Self Care | Payer: Self-pay | Source: Ambulatory Visit | Attending: Nephrology | Admitting: Nephrology

## 2017-10-03 ENCOUNTER — Other Ambulatory Visit: Payer: Self-pay | Admitting: Internal Medicine

## 2017-10-03 DIAGNOSIS — N184 Chronic kidney disease, stage 4 (severe): Secondary | ICD-10-CM

## 2017-10-03 MED FILL — FUROSEMIDE 40 MG TAB: 40 | 30 days supply | Qty: 30 | Fill #0

## 2017-10-03 MED FILL — CLOPIDOGREL 75 MG TABLET: 75 | 30 days supply | Qty: 30 | Fill #3

## 2017-10-04 ENCOUNTER — Telehealth: Payer: Self-pay | Admitting: *Deleted

## 2017-10-04 MED FILL — MEGESTROL 40 MG TABLET: 40 | 30 days supply | Qty: 30 | Fill #1

## 2017-10-04 MED FILL — CARTIA XT 240 MG CAPSULE: 240 | 90 days supply | Qty: 90 | Fill #1

## 2017-10-04 NOTE — Telephone Encounter (Signed)
Patient called to cancel appointment for surgery on 10/09/17."Time inconvenient". Instructed patient to call me when she is ready to schedule this surgery. Verbalized understanding.

## 2017-10-06 ENCOUNTER — Inpatient Hospital Stay (HOSPITAL_COMMUNITY)
Admission: RE | Admit: 2017-10-06 | Discharge: 2017-10-06 | Disposition: A | Discharge status: Home / Self Care | Payer: Self-pay | Source: Ambulatory Visit

## 2017-10-09 ENCOUNTER — Encounter (HOSPITAL_COMMUNITY): Admission: RE | Payer: Self-pay | Source: Ambulatory Visit

## 2017-10-09 ENCOUNTER — Ambulatory Visit (HOSPITAL_COMMUNITY): Admission: RE | Admit: 2017-10-09 | Payer: Medicaid Other | Source: Ambulatory Visit | Admitting: Vascular Surgery

## 2017-10-09 SURGERY — REVISON OF ARTERIOVENOUS FISTULA
Anesthesia: General | Laterality: Right

## 2017-10-10 ENCOUNTER — Ambulatory Visit (HOSPITAL_COMMUNITY)
Admission: RE | Admit: 2017-10-10 | Discharge: 2017-10-10 | Disposition: A | Discharge status: Home / Self Care | Payer: Medicaid Other | Source: Ambulatory Visit | Attending: Nephrology | Admitting: Nephrology

## 2017-10-10 VITALS — BP 100/69 | HR 88 | Temp 98.8°F | Resp 18

## 2017-10-10 DIAGNOSIS — E1122 Type 2 diabetes mellitus with diabetic chronic kidney disease: Secondary | ICD-10-CM | POA: Insufficient documentation

## 2017-10-10 DIAGNOSIS — N185 Chronic kidney disease, stage 5: Secondary | ICD-10-CM | POA: Insufficient documentation

## 2017-10-10 LAB — PREPARE RBC (CROSSMATCH)

## 2017-10-10 MED ORDER — DARBEPOETIN ALFA 200 MCG/0.4ML IJ SOSY
PREFILLED_SYRINGE | INTRAMUSCULAR | Status: AC
  Filled 2017-10-10: qty 0.4

## 2017-10-10 MED ORDER — DARBEPOETIN ALFA 200 MCG/0.4ML IJ SOSY
200.0000 ug | PREFILLED_SYRINGE | INTRAMUSCULAR | Status: DC
  Administered 2017-10-10: 200 ug via SUBCUTANEOUS

## 2017-10-11 ENCOUNTER — Encounter (HOSPITAL_COMMUNITY)
Admission: RE | Admit: 2017-10-11 | Discharge: 2017-10-11 | Disposition: A | Discharge status: Home / Self Care | Payer: Medicaid Other | Source: Ambulatory Visit | Attending: Nephrology | Admitting: Nephrology

## 2017-10-11 DIAGNOSIS — D509 Iron deficiency anemia, unspecified: Secondary | ICD-10-CM | POA: Diagnosis not present

## 2017-10-11 LAB — POCT HEMOGLOBIN-HEMACUE: Hemoglobin: 6.8 g/dL — CL (ref 12.0–15.0)

## 2017-10-11 MED ORDER — SODIUM CHLORIDE 0.9 % IV SOLN: Freq: Once | INTRAVENOUS | Status: DC

## 2017-10-12 LAB — BPAM RBC
Blood Product Expiration Date: 201812042359
ISSUE DATE / TIME: 201811280901
Unit Type and Rh: 5100

## 2017-10-12 LAB — TYPE AND SCREEN
ABO/RH(D): O POS
Antibody Screen: NEGATIVE
Unit division: 0

## 2017-10-24 ENCOUNTER — Encounter (HOSPITAL_COMMUNITY): Payer: Self-pay

## 2017-10-26 ENCOUNTER — Other Ambulatory Visit (HOSPITAL_COMMUNITY): Payer: Self-pay | Admitting: *Deleted

## 2017-10-27 ENCOUNTER — Ambulatory Visit (HOSPITAL_COMMUNITY)
Admission: RE | Admit: 2017-10-27 | Discharge: 2017-10-27 | Disposition: A | Discharge status: Home / Self Care | Payer: Medicaid Other | Source: Ambulatory Visit | Attending: Nephrology | Admitting: Nephrology

## 2017-10-27 VITALS — BP 143/68 | HR 87 | Resp 18

## 2017-10-27 DIAGNOSIS — N185 Chronic kidney disease, stage 5: Secondary | ICD-10-CM | POA: Diagnosis present

## 2017-10-27 DIAGNOSIS — E1122 Type 2 diabetes mellitus with diabetic chronic kidney disease: Secondary | ICD-10-CM | POA: Insufficient documentation

## 2017-10-27 LAB — POCT HEMOGLOBIN-HEMACUE: Hemoglobin: 8.5 g/dL — ABNORMAL LOW (ref 12.0–15.0)

## 2017-10-27 MED ORDER — DARBEPOETIN ALFA 200 MCG/0.4ML IJ SOSY
200.0000 ug | PREFILLED_SYRINGE | INTRAMUSCULAR | Status: DC
  Administered 2017-10-27: 200 ug via SUBCUTANEOUS

## 2017-10-27 MED ORDER — DARBEPOETIN ALFA 200 MCG/0.4ML IJ SOSY
PREFILLED_SYRINGE | INTRAMUSCULAR | Status: AC
  Administered 2017-10-27: 200 ug via SUBCUTANEOUS
  Filled 2017-10-27: qty 0.4

## 2017-11-02 ENCOUNTER — Telehealth: Payer: Self-pay | Admitting: *Deleted

## 2017-11-02 ENCOUNTER — Other Ambulatory Visit: Payer: Self-pay | Admitting: *Deleted

## 2017-11-02 NOTE — Telephone Encounter (Signed)
Phone call to patient instructed to be at [facility] admitting at 6:30 am on 11/15/2017 for surgery. No food or drink past midnight, night prior must have a driver to go home. To follow the detailed instructions she receives from the a Pre- Admission Testing department about this surgery.

## 2017-11-03 MED FILL — CARVEDILOL 25 MG TABLET: 25 | 30 days supply | Qty: 60 | Fill #3

## 2017-11-08 ENCOUNTER — Other Ambulatory Visit: Payer: Self-pay

## 2017-11-08 ENCOUNTER — Encounter (HOSPITAL_COMMUNITY): Payer: Self-pay | Admitting: *Deleted

## 2017-11-08 NOTE — Progress Notes (Signed)
Anesthesia Chart Review:  Pt is a same day work up.   Pt is a 41 year old female scheduled for R brachiocephalic fistula superfistualization on 11/15/2017 with Adele Barthel, MD  - Primary care at [facility]. Last office visit 09/13/17 with Alphonzo Grieve, MD - Nephrologist is Madelon Lips, MD - Cardiologist is Glenetta Hew, MD. Last office visit 07/04/17   PMH includes:  CAD (s/p BMS to LAD 2017), HTN, DM, hyperlipidemia, iron deficiency anemia, CKD (stage 5), asthma, Charcot foot. Former smoker. S/p AV fistula creation 07/31/17. S/p L achilles tendon lengthening, L hindfoot osteotomy and fusion 11/24/16.   - Hospitalized 9/10-20/18 for symptomatic anemia.    Medications include: Lipitor, carvedilol, Plavix, diltiazem, iron, Lasix, NovoLog, Lantus, albuterol. Patient to stay on Plavix perioperatively.  Labs will be obtained day of surgery.   - Last Cr was 5.45 on 09/20/17 at nephrologist's office.    CXR 07/24/17: No active cardiopulmonary disease.   EKG 07/28/17: NSR. Poor R wave progression   Cardiac cath 03/31/16:   Ost LAD to Mid LAD lesion, 90% stenosed. Post intervention (BMS), there is a 0% residual stenosis.   The left ventricular systolic function is normal.   Echo 03/30/16:  - Left ventricle: The cavity size was normal. Wall thickness was normal. Systolic function was normal. The estimated ejection fraction was in the range of 60% to 65%. Wall motion was normal; there were no regional wall motion abnormalities. Doppler parameters are consistent with abnormal left ventricular relaxation (grade 1 diastolic dysfunction). - Tricuspid valve: There was mild regurgitation.   If labs acceptable day of surgery, I anticipate pt can proceed with surgery as scheduled.    Willeen Cass, FNP-BC [facility]/Anesthesiology Phone: [phone] 11/08/2017 2:49 PM

## 2017-11-08 NOTE — Progress Notes (Signed)
Spoke with pt for pre-op call. Pt has CAD hx, denies any recent chest pain or sob. Pt is type 2 diabetic, last A1C was 7.3 in August. Pt states she has not been checking her blood sugar since she has moved. Pt instructed to take 1/2 of her regular dose of Lantus Insulin Tuesday (11/14/17) evening (will take 6 units). Instructed pt not to take her Novolog insulin the morning of surgery. Instructed pt to check her blood sugar when she gets up the day of surgery. If blood sugar is 70 or below, treat with 1/2 cup of clear juice (apple or cranberry) and recheck blood sugar 15 minutes after drinking juice.Pt voiced understanding.

## 2017-11-10 ENCOUNTER — Encounter (HOSPITAL_COMMUNITY)
Admission: RE | Admit: 2017-11-10 | Discharge: 2017-11-10 | Disposition: A | Discharge status: Home / Self Care | Payer: Medicaid Other | Source: Ambulatory Visit | Attending: Nephrology | Admitting: Nephrology

## 2017-11-10 VITALS — BP 139/65 | HR 84 | Temp 98.3°F | Resp 18

## 2017-11-10 DIAGNOSIS — E1122 Type 2 diabetes mellitus with diabetic chronic kidney disease: Secondary | ICD-10-CM

## 2017-11-10 DIAGNOSIS — D509 Iron deficiency anemia, unspecified: Secondary | ICD-10-CM | POA: Insufficient documentation

## 2017-11-10 DIAGNOSIS — N185 Chronic kidney disease, stage 5: Secondary | ICD-10-CM

## 2017-11-10 LAB — IRON AND TIBC
Iron: 47 ug/dL (ref 28–170)
Saturation Ratios: 18 % (ref 10.4–31.8)
TIBC: 258 ug/dL (ref 250–450)
UIBC: 211 ug/dL

## 2017-11-10 LAB — FERRITIN: FERRITIN: 79 ng/mL (ref 11–307)

## 2017-11-10 LAB — POCT HEMOGLOBIN-HEMACUE: Hemoglobin: 9.2 g/dL — ABNORMAL LOW (ref 12.0–15.0)

## 2017-11-10 MED ORDER — DARBEPOETIN ALFA 200 MCG/0.4ML IJ SOSY
PREFILLED_SYRINGE | INTRAMUSCULAR | Status: AC
  Administered 2017-11-10: 200 ug via SUBCUTANEOUS
  Filled 2017-11-10: qty 0.4

## 2017-11-10 MED ORDER — DARBEPOETIN ALFA 200 MCG/0.4ML IJ SOSY
200.0000 ug | PREFILLED_SYRINGE | INTRAMUSCULAR | Status: DC
  Administered 2017-11-10: 200 ug via SUBCUTANEOUS

## 2017-11-10 MED FILL — MEGESTROL 40 MG TABLET: 40 | 30 days supply | Qty: 30 | Fill #2

## 2017-11-10 MED FILL — CLOPIDOGREL 75 MG TABLET: 75 | 30 days supply | Qty: 30 | Fill #4

## 2017-11-10 MED FILL — FUROSEMIDE 40 MG TAB: 40 | 30 days supply | Qty: 30 | Fill #1

## 2017-11-15 ENCOUNTER — Ambulatory Visit (HOSPITAL_COMMUNITY): Payer: Medicaid Other | Admitting: Emergency Medicine

## 2017-11-15 ENCOUNTER — Encounter (HOSPITAL_COMMUNITY): Payer: Self-pay | Admitting: *Deleted

## 2017-11-15 ENCOUNTER — Encounter (HOSPITAL_COMMUNITY)
Admission: RE | Disposition: A | Discharge status: Home / Self Care | Payer: Self-pay | Source: Ambulatory Visit | Attending: Vascular Surgery

## 2017-11-15 ENCOUNTER — Ambulatory Visit (HOSPITAL_COMMUNITY)
Admission: RE | Admit: 2017-11-15 | Discharge: 2017-11-15 | Disposition: A | Discharge status: Home / Self Care | Payer: Medicaid Other | Source: Ambulatory Visit | Attending: Vascular Surgery | Admitting: Vascular Surgery

## 2017-11-15 DIAGNOSIS — I2510 Atherosclerotic heart disease of native coronary artery without angina pectoris: Secondary | ICD-10-CM | POA: Diagnosis not present

## 2017-11-15 DIAGNOSIS — I129 Hypertensive chronic kidney disease with stage 1 through stage 4 chronic kidney disease, or unspecified chronic kidney disease: Secondary | ICD-10-CM | POA: Insufficient documentation

## 2017-11-15 DIAGNOSIS — N184 Chronic kidney disease, stage 4 (severe): Secondary | ICD-10-CM | POA: Insufficient documentation

## 2017-11-15 DIAGNOSIS — I252 Old myocardial infarction: Secondary | ICD-10-CM | POA: Diagnosis not present

## 2017-11-15 DIAGNOSIS — E785 Hyperlipidemia, unspecified: Secondary | ICD-10-CM | POA: Diagnosis not present

## 2017-11-15 DIAGNOSIS — J45909 Unspecified asthma, uncomplicated: Secondary | ICD-10-CM | POA: Diagnosis not present

## 2017-11-15 DIAGNOSIS — Z794 Long term (current) use of insulin: Secondary | ICD-10-CM | POA: Diagnosis not present

## 2017-11-15 DIAGNOSIS — Z79818 Long term (current) use of other agents affecting estrogen receptors and estrogen levels: Secondary | ICD-10-CM | POA: Insufficient documentation

## 2017-11-15 DIAGNOSIS — Z7902 Long term (current) use of antithrombotics/antiplatelets: Secondary | ICD-10-CM | POA: Diagnosis not present

## 2017-11-15 DIAGNOSIS — E1122 Type 2 diabetes mellitus with diabetic chronic kidney disease: Secondary | ICD-10-CM | POA: Diagnosis not present

## 2017-11-15 DIAGNOSIS — T82590A Other mechanical complication of surgically created arteriovenous fistula, initial encounter: Secondary | ICD-10-CM | POA: Diagnosis not present

## 2017-11-15 DIAGNOSIS — N185 Chronic kidney disease, stage 5: Secondary | ICD-10-CM | POA: Diagnosis not present

## 2017-11-15 DIAGNOSIS — Y832 Surgical operation with anastomosis, bypass or graft as the cause of abnormal reaction of the patient, or of later complication, without mention of misadventure at the time of the procedure: Secondary | ICD-10-CM | POA: Diagnosis not present

## 2017-11-15 DIAGNOSIS — Z79899 Other long term (current) drug therapy: Secondary | ICD-10-CM | POA: Diagnosis not present

## 2017-11-15 DIAGNOSIS — Z87891 Personal history of nicotine dependence: Secondary | ICD-10-CM | POA: Diagnosis not present

## 2017-11-15 DIAGNOSIS — F329 Major depressive disorder, single episode, unspecified: Secondary | ICD-10-CM | POA: Diagnosis not present

## 2017-11-15 DIAGNOSIS — Z955 Presence of coronary angioplasty implant and graft: Secondary | ICD-10-CM | POA: Insufficient documentation

## 2017-11-15 DIAGNOSIS — D509 Iron deficiency anemia, unspecified: Secondary | ICD-10-CM | POA: Diagnosis not present

## 2017-11-15 DIAGNOSIS — E11610 Type 2 diabetes mellitus with diabetic neuropathic arthropathy: Secondary | ICD-10-CM | POA: Diagnosis not present

## 2017-11-15 DIAGNOSIS — E11319 Type 2 diabetes mellitus with unspecified diabetic retinopathy without macular edema: Secondary | ICD-10-CM | POA: Insufficient documentation

## 2017-11-15 DIAGNOSIS — Z8614 Personal history of Methicillin resistant Staphylococcus aureus infection: Secondary | ICD-10-CM | POA: Insufficient documentation

## 2017-11-15 HISTORY — PX: FISTULA SUPERFICIALIZATION: SHX6341

## 2017-11-15 LAB — POCT I-STAT 4, (NA,K, GLUC, HGB,HCT)
Glucose, Bld: 134 mg/dL — ABNORMAL HIGH (ref 65–99)
HCT: 28 % — ABNORMAL LOW (ref 36.0–46.0)
Hemoglobin: 9.5 g/dL — ABNORMAL LOW (ref 12.0–15.0)
Potassium: 4.4 mmol/L (ref 3.5–5.1)
SODIUM: 140 mmol/L (ref 135–145)

## 2017-11-15 LAB — GLUCOSE, CAPILLARY
GLUCOSE-CAPILLARY: 132 mg/dL — AB (ref 65–99)
Glucose-Capillary: 159 mg/dL — ABNORMAL HIGH (ref 65–99)

## 2017-11-15 LAB — HCG, SERUM, QUALITATIVE: PREG SERUM: NEGATIVE

## 2017-11-15 SURGERY — FISTULA SUPERFICIALIZATION
Anesthesia: General | Site: Arm Upper | Laterality: Right

## 2017-11-15 MED ORDER — MEPERIDINE HCL 25 MG/ML IJ SOLN: 6.2500 mg | INTRAMUSCULAR | Status: DC | PRN

## 2017-11-15 MED ORDER — ONDANSETRON HCL 4 MG/2ML IJ SOLN
INTRAMUSCULAR | Status: AC
  Filled 2017-11-15: qty 2

## 2017-11-15 MED ORDER — DEXTROSE 5 % IV SOLN
1.5000 g | INTRAVENOUS | Status: AC
  Administered 2017-11-15: 1.5 g via INTRAVENOUS
  Filled 2017-11-15: qty 1.5

## 2017-11-15 MED ORDER — OXYCODONE-ACETAMINOPHEN 5-325 MG PO TABS: 1.0000 | ORAL_TABLET | Freq: Three times a day (TID) | ORAL | 0 refills | Status: DC | PRN

## 2017-11-15 MED ORDER — SCOPOLAMINE 1 MG/3DAYS TD PT72
MEDICATED_PATCH | TRANSDERMAL | Status: AC
  Administered 2017-11-15: 1.5 mg via TRANSDERMAL
  Filled 2017-11-15: qty 1

## 2017-11-15 MED ORDER — PHENYLEPHRINE HCL 10 MG/ML IJ SOLN
INTRAMUSCULAR | Status: DC | PRN
  Administered 2017-11-15 (×2): 80 ug via INTRAVENOUS
  Administered 2017-11-15: 40 ug via INTRAVENOUS
  Administered 2017-11-15 (×2): 80 ug via INTRAVENOUS
  Administered 2017-11-15: 40 ug via INTRAVENOUS

## 2017-11-15 MED ORDER — DEXAMETHASONE SODIUM PHOSPHATE 10 MG/ML IJ SOLN
INTRAMUSCULAR | Status: DC | PRN
  Administered 2017-11-15: 10 mg via INTRAVENOUS

## 2017-11-15 MED ORDER — 0.9 % SODIUM CHLORIDE (POUR BTL) OPTIME
TOPICAL | Status: DC | PRN
  Administered 2017-11-15: 1000 mL

## 2017-11-15 MED ORDER — SODIUM CHLORIDE 0.9 % IV SOLN
INTRAVENOUS | Status: DC | PRN
  Administered 2017-11-15: 500 mL

## 2017-11-15 MED ORDER — SODIUM CHLORIDE 0.9 % IV SOLN
INTRAVENOUS | Status: DC
  Administered 2017-11-15 (×2): via INTRAVENOUS

## 2017-11-15 MED ORDER — HEPARIN SODIUM (PORCINE) 1000 UNIT/ML IJ SOLN
INTRAMUSCULAR | Status: AC
  Filled 2017-11-15: qty 1

## 2017-11-15 MED ORDER — FENTANYL CITRATE (PF) 100 MCG/2ML IJ SOLN
INTRAMUSCULAR | Status: DC | PRN
  Administered 2017-11-15 (×3): 50 ug via INTRAVENOUS

## 2017-11-15 MED ORDER — MIDAZOLAM HCL 5 MG/5ML IJ SOLN
INTRAMUSCULAR | Status: DC | PRN
  Administered 2017-11-15: 2 mg via INTRAVENOUS

## 2017-11-15 MED ORDER — PROPOFOL 10 MG/ML IV BOLUS
INTRAVENOUS | Status: AC
  Filled 2017-11-15: qty 20

## 2017-11-15 MED ORDER — LIDOCAINE HCL 1 % IJ SOLN
INTRAMUSCULAR | Status: AC
  Filled 2017-11-15: qty 20

## 2017-11-15 MED ORDER — LIDOCAINE 2% (20 MG/ML) 5 ML SYRINGE
INTRAMUSCULAR | Status: AC
  Filled 2017-11-15: qty 5

## 2017-11-15 MED ORDER — ONDANSETRON HCL 4 MG/2ML IJ SOLN
INTRAMUSCULAR | Status: DC | PRN
  Administered 2017-11-15: 4 mg via INTRAVENOUS

## 2017-11-15 MED ORDER — HYDROMORPHONE HCL 1 MG/ML IJ SOLN: 0.2500 mg | INTRAMUSCULAR | Status: DC | PRN

## 2017-11-15 MED ORDER — DEXAMETHASONE SODIUM PHOSPHATE 10 MG/ML IJ SOLN
INTRAMUSCULAR | Status: AC
  Filled 2017-11-15: qty 1

## 2017-11-15 MED ORDER — MIDAZOLAM HCL 2 MG/2ML IJ SOLN
INTRAMUSCULAR | Status: AC
  Filled 2017-11-15: qty 2

## 2017-11-15 MED ORDER — LIDOCAINE HCL (CARDIAC) 20 MG/ML IV SOLN
INTRAVENOUS | Status: DC | PRN
  Administered 2017-11-15: 100 mg via INTRAVENOUS

## 2017-11-15 MED ORDER — SCOPOLAMINE 1 MG/3DAYS TD PT72
1.0000 | MEDICATED_PATCH | TRANSDERMAL | Status: DC
  Administered 2017-11-15: 1.5 mg via TRANSDERMAL
  Filled 2017-11-15: qty 1

## 2017-11-15 MED ORDER — ONDANSETRON HCL 4 MG/2ML IJ SOLN: 4.0000 mg | Freq: Once | INTRAMUSCULAR | Status: DC | PRN

## 2017-11-15 MED ORDER — PHENYLEPHRINE 40 MCG/ML (10ML) SYRINGE FOR IV PUSH (FOR BLOOD PRESSURE SUPPORT)
PREFILLED_SYRINGE | INTRAVENOUS | Status: AC
  Filled 2017-11-15: qty 10

## 2017-11-15 MED ORDER — FENTANYL CITRATE (PF) 250 MCG/5ML IJ SOLN
INTRAMUSCULAR | Status: AC
  Filled 2017-11-15: qty 5

## 2017-11-15 MED ORDER — PROPOFOL 10 MG/ML IV BOLUS
INTRAVENOUS | Status: DC | PRN
  Administered 2017-11-15: 50 mg via INTRAVENOUS
  Administered 2017-11-15: 150 mg via INTRAVENOUS

## 2017-11-15 MED FILL — OXYCODONE-ACETAMINOPHEN 5-3: 5-325 | 2 days supply | Qty: 8 | Fill #0

## 2017-11-15 SURGICAL SUPPLY — 33 items
ARMBAND PINK RESTRICT EXTREMIT (MISCELLANEOUS) ×2 IMPLANT
CANISTER SUCT 3000ML PPV (MISCELLANEOUS) ×2 IMPLANT
CLIP VESOCCLUDE MED 6/CT (CLIP) ×2 IMPLANT
CLIP VESOCCLUDE SM WIDE 6/CT (CLIP) ×2 IMPLANT
COVER PROBE W GEL 5X96 (DRAPES) ×2 IMPLANT
DECANTER SPIKE VIAL GLASS SM (MISCELLANEOUS) ×2 IMPLANT
DERMABOND ADVANCED (GAUZE/BANDAGES/DRESSINGS) ×1
DERMABOND ADVANCED .7 DNX12 (GAUZE/BANDAGES/DRESSINGS) ×1 IMPLANT
DRAIN PENROSE 1/2X12 LTX STRL (WOUND CARE) IMPLANT
ELECT REM PT RETURN 9FT ADLT (ELECTROSURGICAL) ×2
ELECTRODE REM PT RTRN 9FT ADLT (ELECTROSURGICAL) ×1 IMPLANT
GLOVE BIO SURGEON STRL SZ 6.5 (GLOVE) ×4 IMPLANT
GLOVE BIO SURGEON STRL SZ7 (GLOVE) ×2 IMPLANT
GLOVE BIOGEL PI IND STRL 6.5 (GLOVE) ×4 IMPLANT
GLOVE BIOGEL PI IND STRL 7.5 (GLOVE) ×1 IMPLANT
GLOVE BIOGEL PI INDICATOR 6.5 (GLOVE) ×4
GLOVE BIOGEL PI INDICATOR 7.5 (GLOVE) ×1
GOWN STRL REUS W/ TWL LRG LVL3 (GOWN DISPOSABLE) ×3 IMPLANT
GOWN STRL REUS W/TWL LRG LVL3 (GOWN DISPOSABLE) ×3
HEMOSTAT SPONGE AVITENE ULTRA (HEMOSTASIS) IMPLANT
KIT BASIN OR (CUSTOM PROCEDURE TRAY) ×2 IMPLANT
KIT ROOM TURNOVER OR (KITS) ×2 IMPLANT
NS IRRIG 1000ML POUR BTL (IV SOLUTION) ×2 IMPLANT
PACK CV ACCESS (CUSTOM PROCEDURE TRAY) ×2 IMPLANT
PAD ARMBOARD 7.5X6 YLW CONV (MISCELLANEOUS) ×4 IMPLANT
SUT MNCRL AB 4-0 PS2 18 (SUTURE) ×2 IMPLANT
SUT PROLENE 6 0 BV (SUTURE) IMPLANT
SUT PROLENE 7 0 BV 1 (SUTURE) ×2 IMPLANT
SUT VIC AB 3-0 SH 27 (SUTURE) ×3
SUT VIC AB 3-0 SH 27X BRD (SUTURE) ×3 IMPLANT
TOWEL GREEN STERILE (TOWEL DISPOSABLE) ×2 IMPLANT
UNDERPAD 30X30 (UNDERPADS AND DIAPERS) ×2 IMPLANT
WATER STERILE IRR 1000ML POUR (IV SOLUTION) ×2 IMPLANT

## 2017-11-15 NOTE — Op Note (Signed)
    OPERATIVE NOTE   PROCEDURE: 1. Right brachiocephalic arteriovenous fistula superficialization 2. Side branch ligation  PRE-OPERATIVE DIAGNOSIS: Deep arteriovenous fistula   POST-OPERATIVE DIAGNOSIS: same as above   SURGEON: Adele Barthel, MD  ASSISTANT(S): Leontine Locket, PAC   ANESTHESIA: general  ESTIMATED BLOOD LOSS: 50 cc  FINDING(S): 1. Patent fistula > 6 mm throughout with hydrodistension with one side branch 2. Palpable thrill at end of case  SPECIMEN(S):  none  INDICATIONS:   Jane [name] is a 42 y.o. female who presents with mature arteriovenous fistula that is > 6 mm deep.  It was felt that without superficialization of the arteriovenous fistula it could not be used for hemodialysis.  Risk, benefits, and alternatives to access surgery were discussed.  The patient is aware the risks include but are not limited to: bleeding, infection, steal syndrome, nerve damage, ischemic monomelic neuropathy, thrombosis of the access, need for additional procedures, death and stroke.  The patient agrees to proceed forward with the procedure.   DESCRIPTION: After obtain full informed written consent, the patient was brought back to the operating room and placed supine upon the operating table.  The patient received IV antibiotics prior to induction.  After obtaining adequate anesthesia, the patient was prepped and draped in the standard fashion for: right arm access.  I turned my attention to the antecubitum, where I could feel the arterial anastomosis.  Using the Sonosite, I marked out the pathway of the cephalic vein all the way up to the axilla.  I made an incision 2 cm distal to the arterial anastomosis up to 2 cm distal to the axilla.  Using electrocautery and blunt dissection, I developed a plane down the vein.  I tied off one side branch with silk ties prior to transecting it.  In such fashion, I fully mobilized the exposed segment of fistula.  The vein was noted to be 6 mm in  diameter externally.  Using the electrocautery, a shallow trough (6 mm deep) was developed in the lateral subcutaneous tissue in the incision.  The vein was transposed into the trough and secured with interrupted stitches of 3-0 Vicryl.  The subcutaneous tissue was reapproximated with a running stitch of 3-0 Vicryl.  The skin was reapproximated with a running subcuticular of 4-0 Monocryl.  The skin was cleaned, dried, and reinforced with Dermabond.   COMPLICATIONS: none  CONDITION: stable  Adele Barthel, MD, [name] [place] Office: [phone] Pager: [phone]  11/15/2017, 9:35 AM

## 2017-11-15 NOTE — Discharge Instructions (Signed)
° °  [facility]  Discharge Instructions  AV Fistula or Graft Surgery for Dialysis Access  Please refer to the following instructions for your post-procedure care. Your surgeon or physician assistant will discuss any changes with you.  Activity  You may drive the day following your surgery, if you are comfortable and no longer taking prescription pain medication. Resume full activity as the soreness in your incision resolves.  Bathing/Showering  You may shower after you go home. Keep your incision dry for 48 hours. Do not soak in a bathtub, hot tub, or swim until the incision heals completely. You may not shower if you have a hemodialysis catheter.  Incision Care  Clean your incision with mild soap and water after 48 hours. Pat the area dry with a clean towel. You do not need a bandage unless otherwise instructed. Do not apply any ointments or creams to your incision. You may have skin glue on your incision. Do not peel it off. It will come off on its own in about one week. Your arm may swell a bit after surgery. To reduce swelling use pillows to elevate your arm so it is above your heart. Your doctor will tell you if you need to lightly wrap your arm with an ACE bandage.  Diet  Resume your normal diet. There are not special food restrictions following this procedure. In order to heal from your surgery, it is CRITICAL to get adequate nutrition. Your body requires vitamins, minerals, and protein. Vegetables are the best source of vitamins and minerals. Vegetables also provide the perfect balance of protein. Processed food has little nutritional value, so try to avoid this.  Medications  Resume taking all of your medications. If your incision is causing pain, you may take over-the counter pain relievers such as acetaminophen (Tylenol). If you were prescribed a stronger pain medication, please be aware these medications can cause nausea and constipation. Prevent  nausea by taking the medication with a snack or meal. Avoid constipation by drinking plenty of fluids and eating foods with high amount of fiber, such as fruits, vegetables, and grains.  Do not take Tylenol if you are taking prescription pain medications.  Follow up Your surgeon may want to see you in the office following your access surgery. If so, this will be arranged at the time of your surgery.  Please call us immediately for any of the following conditions:  Increased pain, redness, drainage (pus) from your incision site Fever of 101 degrees or higher Severe or worsening pain at your incision site Hand pain or numbness.  Reduce your risk of vascular disease:  Stop smoking. If you would like help, call [facility] at 1-800-QUIT-NOW 323-725-9427) or [facility] your cholesterol Maintain a desired weight Control your diabetes Keep your blood pressure down  Dialysis  It will take several weeks to several months for your new dialysis access to be ready for use. Your surgeon will determine when it is OK to use it. Your nephrologist will continue to direct your dialysis. You can continue to use your Permcath until your new access is ready for use.   11/15/2017 Jane [name] [id number] June 01, 1976  Surgeon(s): Conrad Tracy, MD  Procedure(s): FISTULA SUPERFICIALIZATION RIGHT BRACHIOCEPHALIC  RIGHT  x Do not stick fistula for 4 weeks    If you have any questions, please call the office at [phone].

## 2017-11-15 NOTE — Anesthesia Postprocedure Evaluation (Signed)
Anesthesia Post Note  Patient: KELSEE PRESLAR  Procedure(s) Performed: FISTULA SUPERFICIALIZATION RIGHT BRACHIOCEPHALIC  RIGHT (Right Arm Upper)     Patient location during evaluation: PACU Anesthesia Type: General Level of consciousness: awake and alert Pain management: pain level controlled Vital Signs Assessment: post-procedure vital signs reviewed and stable Respiratory status: spontaneous breathing, nonlabored ventilation, respiratory function stable and patient connected to nasal cannula oxygen Cardiovascular status: blood pressure returned to baseline and stable Postop Assessment: no apparent nausea or vomiting Anesthetic complications: no    Last Vitals:  Vitals:   11/15/17 1120 11/15/17 1150  BP: (!) 126/91 (!) 164/83  Pulse: 88 87  Resp: 16 15  Temp: 36.7 C 36.7 C  SpO2: 100% 100%    Last Pain:  Vitals:   11/15/17 1200  TempSrc:   PainSc: 0-No pain                 Latrece Nitta DAVID

## 2017-11-15 NOTE — Transfer of Care (Signed)
Immediate Anesthesia Transfer of Care Note  Patient: Jane [name]  Procedure(s) Performed: FISTULA SUPERFICIALIZATION RIGHT BRACHIOCEPHALIC  RIGHT (Right Arm Upper)  Patient Location: PACU  Anesthesia Type:General  Level of Consciousness: awake, oriented and patient cooperative  Airway & Oxygen Therapy: Patient Spontanous Breathing and Patient connected to nasal cannula oxygen  Post-op Assessment: Report given to RN and Post -op Vital signs reviewed and stable  Post vital signs: Reviewed  Last Vitals:  Vitals:   11/15/17 0629 11/15/17 0950  BP: (!) 173/76   Pulse: 89   Resp: 18   Temp: 37.1 C (!) (P) 36.4 C  SpO2: 100%     Last Pain:  Vitals:   11/15/17 0629  TempSrc: Oral         Complications: No apparent anesthesia complications

## 2017-11-15 NOTE — Anesthesia Procedure Notes (Signed)
Procedure Name: LMA Insertion Date/Time: 11/15/2017 8:32 AM Performed by: Jenne Campus, CRNA Pre-anesthesia Checklist: Patient identified, Emergency Drugs available, Suction available and Patient being monitored Patient Re-evaluated:Patient Re-evaluated prior to induction Oxygen Delivery Method: Circle System Utilized Preoxygenation: Pre-oxygenation with 100% oxygen Induction Type: IV induction Ventilation: Mask ventilation without difficulty LMA: LMA with gastric port inserted LMA Size: 4.0 Number of attempts: 1 Airway Equipment and Method: Bite block Placement Confirmation: positive ETCO2 and breath sounds checked- equal and bilateral Tube secured with: Tape Dental Injury: Teeth and Oropharynx as per pre-operative assessment

## 2017-11-15 NOTE — Anesthesia Preprocedure Evaluation (Addendum)
Anesthesia Evaluation  Patient identified by MRN, date of birth, ID band Patient awake    Reviewed: Allergy & Precautions, NPO status , Patient's Chart, lab work & pertinent test results  Airway Mallampati: I  TM Distance: >3 FB Neck ROM: Full    Dental  (+) Teeth Intact, Dental Advisory Given   Pulmonary asthma , former smoker,    Pulmonary exam normal breath sounds clear to auscultation       Cardiovascular hypertension, Pt. on medications + CAD and + Past MI  Normal cardiovascular exam Rhythm:Regular Rate:Normal     Neuro/Psych Depression    GI/Hepatic   Endo/Other  diabetes, Type 2, Insulin Dependent  Renal/GU Renal InsufficiencyRenal disease     Musculoskeletal   Abdominal   Peds  Hematology   Anesthesia Other Findings   Reproductive/Obstetrics                            Anesthesia Physical Anesthesia Plan  ASA: III  Anesthesia Plan: General   Post-op Pain Management:    Induction: Intravenous  PONV Risk Score and Plan: 3 and Ondansetron and Scopolamine patch - Pre-op  Airway Management Planned: LMA  Additional Equipment:   Intra-op Plan:   Post-operative Plan: Extubation in OR  Informed Consent: I have reviewed the patients History and Physical, chart, labs and discussed the procedure including the risks, benefits and alternatives for the proposed anesthesia with the patient or authorized representative who has indicated his/her understanding and acceptance.     Plan Discussed with: CRNA and Surgeon  Anesthesia Plan Comments:         Anesthesia Quick Evaluation

## 2017-11-15 NOTE — H&P (Addendum)
Brief History and Physical  History of Present Illness   Jane [name] is a 42 y.o. female who presents with chief complaint: none.  The patient presents today for [facility] AVF superficialization.    Past Medical History:  Diagnosis Date  . Asthma    prn inhaler  . CAD S/P BMS PCI to prox LAD 03/31/2016   Ost LAD to Mid LAD lesion, 90% stenosed. Post intervention - Vision BMS 3.0 mm x 18 mm (~3.5 mm) there is a 0% residual stenosis.   . Charcot foot due to diabetes mellitus ([place]) 11/2016   left  . Chronic kidney disease    Stage 4  . Depression   . Diabetic neuropathy (HCC)    feet  . H/O non-ST elevation myocardial infarction (NSTEMI) 03/2016   Found in 9% mid LAD lesion treated with bare-metal stent (BMS) PCI - vision BMS 3.0 mm x 18 mm  . History of MRSA infection   . Hyperlipidemia   . Hypertension    medication dose increased 11/22/2015; has been taking med. consistently since 03/2016, per pt.  . Insulin dependent diabetes mellitus ([place])   . Iron deficiency anemia    takes iron supplement  . Pneumonia   . Retinopathy of both eyes   . Sickle cell trait ([place])   . 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 ACHILLES TENDON LENGTHENING Left 11/24/2016   Procedure: Left Achilles tendon lengthening (open);  Surgeon: Wylene Simmer, MD;  Location: [place];  Service: Orthopedics;  Laterality: Left;  . AV FISTULA PLACEMENT Right 07/31/2017   Procedure: ARTERIOVENOUS BRACHIOCEPHALIC FISTULA CREATION RIGHT ARM;  Surgeon: Conrad Aberdeen, MD;  Location: [place];  Service: Vascular;  Laterality: Right;  . CALCANEAL OSTEOTOMY Left 11/24/2016   Procedure: Left hindfoot osteotomy and fusion;  Surgeon: Wylene Simmer, MD;  Location: [place];  Service: Orthopedics;  Laterality: Left;  . CARDIAC CATHETERIZATION N/A 03/31/2016   Procedure: Left Heart Cath and Coronary Angiography;  Surgeon: Lorretta Harp,  MD;  Location[name] INVASIVE CV LAB: 90% early mLAD, normal LV Fxn  . CARDIAC CATHETERIZATION N/A 03/31/2016   Procedure: Coronary Stent Intervention;  Surgeon: Lorretta Harp, MD;  Location: [employer] CV LAB;  Service: Cardiovascular: PCI to mLAD BMS Vision 3.0 mm x 18 mm  . CESAREAN SECTION  1997  . CORONARY ANGIOPLASTY    . TRANSTHORACIC ECHOCARDIOGRAM  03/2016   Normal LV size and thickness. EF 60-65%. GR 1 DD. Otherwise essentially normal.  . Jay    Social History   Socioeconomic History  . Marital status: Single    Spouse name: Not on file  . Number of children: 2  . Years of education: 53  . Highest education level: Not on file  Social Needs  . Financial resource strain: Not on file  . Food insecurity - worry: Not on file  . Food insecurity - inability: Not on file  . Transportation needs - medical: Not on file  . Transportation needs - non-medical: Not on file  Occupational History  . Occupation:   Theatre stage manager: [employer]  Tobacco Use  . Smoking status: Former Smoker    Packs/day: 0.00    Years: 0.00    Pack years: 0.00    Last attempt to quit: 08/29/2015    Years since quitting: 2.2  . Smokeless tobacco: Never Used  Substance and Sexual Activity  . Alcohol use: No    Alcohol/week: 0.0 oz  . Drug use: No  . Sexual activity: Yes    Birth control/protection: Surgical  Other Topics Concern  . Not on file  Social History Narrative   Patient does not drink caffeine.   Patient is right handed.     Family History  Problem Relation Age of Onset  . Stroke Mother   . Diabetes Mother   . Hypertension Mother   . Aneurysm Mother   . Diabetes Father   . Hypertension Father   . Diabetes Sister   . Hypertension Sister   . Diabetes Maternal Grandmother   . Diabetes Maternal Grandfather   . Cancer Paternal Grandfather        Prostate    Current Facility-Administered Medications  Medication Dose Route Frequency Provider Last Rate  Last Dose  . 0.9 %  sodium chloride infusion   Intravenous Continuous Conrad Richfield, MD      . cefUROXime (ZINACEF) 1.5 g in dextrose 5 % 50 mL IVPB  1.5 g Intravenous 30 min Pre-Op Conrad Lynnville, MD        Allergies  Allergen Reactions  . Adhesive [Tape] Other (See Comments)    Irritation     Review of Systems: As listed above, otherwise negative.   Physical Examination   Vitals:   11/15/17 0629 11/15/17 0700  BP: (!) 173/76   Pulse: 89   Resp: 18   Temp: 98.7 F (37.1 C)   TempSrc: Oral   SpO2: 100%   Weight:  234 lb 1 oz (106.2 kg)  Height:  5\' 5"  (1.651 m)   Body mass index is 38.95 kg/m.  General Alert, O x 3, WD, NAD  Pulmonary Sym exp, good B air movt, CTA B  Cardiac RRR, Nl S1, S2, no Murmurs, No rubs, No S3,S4  Musculo- skeletal R UA: Palpable distal thrill, +bruit  Neurologic Pain and light touch intact in extremities ,     Laboratory  See [place] is a 42 y.o. female who presents with: deep right brachiocephalic arteriovenous fistula, chronic kidney disease stage IV-V .   The patient is scheduled for: Left arm arteriovenous fistula superficialization  Risk, benefits, and alternatives to access surgery were discussed.  The patient is aware the risks include but are not limited to: bleeding, infection, steal syndrome, nerve damage, ischemic monomelic neuropathy, failure to mature, and need for additional procedures.  The patient is aware of the risks and agrees to proceed.   Jane Barthel, MD, FACS [facility] [place] Office: [phone] Pager: [phone]  11/15/2017, 7:49 AM

## 2017-11-16 ENCOUNTER — Encounter (HOSPITAL_COMMUNITY): Payer: Self-pay | Admitting: Vascular Surgery

## 2017-11-22 ENCOUNTER — Encounter: Payer: Self-pay | Admitting: Internal Medicine

## 2017-11-22 NOTE — Progress Notes (Deleted)
   CC: ***  HPI:  [name] is a 42 y.o. with a PMH of ***  Please see problem based Assessment and Plan for status of patients chronic conditions.  Past Medical History:  Diagnosis Date  . Asthma    prn inhaler  . CAD S/P BMS PCI to prox LAD 03/31/2016   Ost LAD to Mid LAD lesion, 90% stenosed. Post intervention - Vision BMS 3.0 mm x 18 mm (~3.5 mm) there is a 0% residual stenosis.   . Charcot foot due to diabetes mellitus ([place]) 11/2016   left  . Chronic kidney disease    Stage 4  . Depression   . Diabetic neuropathy (HCC)    feet  . H/O non-ST elevation myocardial infarction (NSTEMI) 03/2016   Found in 9% mid LAD lesion treated with bare-metal stent (BMS) PCI - vision BMS 3.0 mm x 18 mm  . History of MRSA infection   . Hyperlipidemia   . Hypertension    medication dose increased 11/22/2015; has been taking med. consistently since 03/2016, per pt.  . Insulin dependent diabetes mellitus ([place])   . Iron deficiency anemia    takes iron supplement  . Pneumonia   . Retinopathy of both eyes   . Sickle cell trait ([place])   . 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 GENERAL- alert, co-operative, appears as stated age, not in any distress. HEENT- Atraumatic, normocephalic, PERRL, EOMI, oral mucosa appears moist CARDIAC- RRR, no murmurs, rubs or gallops. RESP- Moving equal volumes of air, and clear to auscultation bilaterally, no wheezes or crackles. ABDOMEN- Soft, nontender, bowel sounds present. NEURO- No obvious Cr N abnormality. EXTREMITIES- pulse 2+, symmetric, no pedal edema. SKIN- Warm, dry, No rash or lesion. PSYCH- Normal mood and affect, appropriate thought content and speech.  Assessment & Plan:   See Encounters Tab for problem based charting.   Patient {GC/GE:[id number]::"discussed with","seen with"} Dr. {NAMES:[id number]::"Butcher","Granfortuna","E.  Hoffman","Klima","Mullen","Narendra","Vincent"}   Alphonzo Grieve, MD Internal Medicine PGY2

## 2017-11-24 ENCOUNTER — Ambulatory Visit (HOSPITAL_COMMUNITY)
Admission: RE | Admit: 2017-11-24 | Discharge: 2017-11-24 | Disposition: A | Discharge status: Home / Self Care | Payer: Medicaid Other | Source: Ambulatory Visit | Attending: Nephrology | Admitting: Nephrology

## 2017-11-24 VITALS — BP 153/76 | HR 89 | Resp 18

## 2017-11-24 DIAGNOSIS — N185 Chronic kidney disease, stage 5: Secondary | ICD-10-CM | POA: Insufficient documentation

## 2017-11-24 DIAGNOSIS — E1122 Type 2 diabetes mellitus with diabetic chronic kidney disease: Secondary | ICD-10-CM | POA: Diagnosis not present

## 2017-11-24 LAB — RENAL FUNCTION PANEL
ANION GAP: 10 (ref 5–15)
Albumin: 3 g/dL — ABNORMAL LOW (ref 3.5–5.0)
BUN: 72 mg/dL — ABNORMAL HIGH (ref 6–20)
CO2: 18 mmol/L — AB (ref 22–32)
Calcium: 8.3 mg/dL — ABNORMAL LOW (ref 8.9–10.3)
Chloride: 110 mmol/L (ref 101–111)
Creatinine, Ser: 7.63 mg/dL — ABNORMAL HIGH (ref 0.44–1.00)
GFR calc non Af Amer: 6 mL/min — ABNORMAL LOW (ref >60)
GFR, EST AFRICAN AMERICAN: 7 mL/min — AB (ref >60)
Glucose, Bld: 273 mg/dL — ABNORMAL HIGH (ref 65–99)
PHOSPHORUS: 6.6 mg/dL — AB (ref 2.5–4.6)
Potassium: 4.6 mmol/L (ref 3.5–5.1)
SODIUM: 138 mmol/L (ref 135–145)

## 2017-11-24 LAB — POCT HEMOGLOBIN-HEMACUE: HEMOGLOBIN: 10.5 g/dL — AB (ref 12.0–15.0)

## 2017-11-24 MED ORDER — DARBEPOETIN ALFA 200 MCG/0.4ML IJ SOSY
PREFILLED_SYRINGE | INTRAMUSCULAR | Status: AC
  Administered 2017-11-24: 200 ug via SUBCUTANEOUS
  Filled 2017-11-24: qty 0.4

## 2017-11-24 MED ORDER — DARBEPOETIN ALFA 200 MCG/0.4ML IJ SOSY
200.0000 ug | PREFILLED_SYRINGE | INTRAMUSCULAR | Status: DC
  Administered 2017-11-24: 200 ug via SUBCUTANEOUS

## 2017-11-25 LAB — PTH, INTACT AND CALCIUM
CALCIUM TOTAL (PTH): 8.6 mg/dL — AB (ref 8.7–10.2)
PTH: 81 pg/mL — ABNORMAL HIGH (ref 15–65)

## 2017-11-28 ENCOUNTER — Telehealth: Payer: Self-pay | Admitting: *Deleted

## 2017-11-28 NOTE — Telephone Encounter (Signed)
Thank you!  Jane [name] 

## 2017-11-28 NOTE — Telephone Encounter (Signed)
Pt states a HHN came today and pt's bp was 159/111, states HHN checked bp twice but did not tell her what the other reading was, HHN told pt to call office, pt states she is in a great deal of surgical pain, had surg 1/2, states her pain level is 8 and she has not taken any pain med, states she took BP med this am at 1045. HHN arrived 1400-1430. Pt denies shortness of breath, chest pain, severe h/a, N&V, weakness, vision changes. appt [facility] 1/16 at 0945

## 2017-11-29 ENCOUNTER — Telehealth: Payer: Self-pay

## 2017-11-29 ENCOUNTER — Ambulatory Visit: Payer: Medicaid Other | Admitting: Internal Medicine

## 2017-11-29 DIAGNOSIS — I2510 Atherosclerotic heart disease of native coronary artery without angina pectoris: Secondary | ICD-10-CM | POA: Diagnosis not present

## 2017-11-29 DIAGNOSIS — J45909 Unspecified asthma, uncomplicated: Secondary | ICD-10-CM

## 2017-11-29 DIAGNOSIS — R51 Headache: Secondary | ICD-10-CM | POA: Diagnosis not present

## 2017-11-29 DIAGNOSIS — Z794 Long term (current) use of insulin: Secondary | ICD-10-CM | POA: Diagnosis not present

## 2017-11-29 DIAGNOSIS — Z955 Presence of coronary angioplasty implant and graft: Secondary | ICD-10-CM

## 2017-11-29 DIAGNOSIS — N184 Chronic kidney disease, stage 4 (severe): Secondary | ICD-10-CM | POA: Diagnosis not present

## 2017-11-29 DIAGNOSIS — E1122 Type 2 diabetes mellitus with diabetic chronic kidney disease: Secondary | ICD-10-CM | POA: Diagnosis not present

## 2017-11-29 DIAGNOSIS — Z87891 Personal history of nicotine dependence: Secondary | ICD-10-CM | POA: Diagnosis not present

## 2017-11-29 DIAGNOSIS — M79621 Pain in right upper arm: Secondary | ICD-10-CM | POA: Diagnosis not present

## 2017-11-29 DIAGNOSIS — I129 Hypertensive chronic kidney disease with stage 1 through stage 4 chronic kidney disease, or unspecified chronic kidney disease: Secondary | ICD-10-CM | POA: Diagnosis not present

## 2017-11-29 DIAGNOSIS — Z79899 Other long term (current) drug therapy: Secondary | ICD-10-CM

## 2017-11-29 DIAGNOSIS — I10 Essential (primary) hypertension: Secondary | ICD-10-CM

## 2017-11-29 DIAGNOSIS — I770 Arteriovenous fistula, acquired: Secondary | ICD-10-CM | POA: Diagnosis not present

## 2017-11-29 DIAGNOSIS — M79601 Pain in right arm: Secondary | ICD-10-CM | POA: Insufficient documentation

## 2017-11-29 NOTE — Assessment & Plan Note (Addendum)
Assessment On carvedilol 25 mg twice a day, diltiazem 240 mg daily, and Lasix 40 mg daily. Blood pressure yesterday was checked by home health nurse and noted to be 159/111. Blood pressure was elevated to 181/89, heart rate 87 today. She states she hasn't taken her blood pressure medications yet today because "it wasn't time for her to take them yet." She reports compliance with her medications daily. She endorses a slight headache currently. Denies blurry vision, chest pain, or shortness of breath.  Her elevated blood pressure today is most likely due to not taking her medications this morning. The pain in her right arm may be contributing to her elevated blood pressure. We will not make any changes to her blood pressure regimen today. We will work on pain control with alternating Tylenol and Percocet. I advised patient to continue taking her blood pressure medications as prescribed and for close follow-up in our clinic.  Plan  - Continue carvedilol, diltiazem, and Lasix - Follow up in clinic in 1 week for blood pressure recheck - Advised patient to contact surgeon's office regarding pain 2 weeks out from her surgery

## 2017-11-29 NOTE — Telephone Encounter (Signed)
Would like to speak with a nurse about pain med. Please call pt back.

## 2017-11-29 NOTE — Telephone Encounter (Signed)
Returned call to patient. She stated she went to her PCP today due to her BP being up the last week. She was still having some discomfort from her fistula placement on 11/15/17. PCP recommended she alternate Tylenol and Oxycodone for relief. Patient stated she only has 6 Oxycodone left. It was recommended to her to get her PCP to write prescription since she was seen by them today. Patient also needs follow up appointment. Will have schedulers make appointment and call patient with day and time.

## 2017-11-29 NOTE — Patient Instructions (Signed)
FOLLOW-UP INSTRUCTIONS When: 1 week For: BP re-check What to bring: medications, BP log   Jane [name],  It was a pleasure to meet you today.  Your BP was a little high today. I think that is a combination of not taking your BP medications yet this morning and the pain you are having in your arm.  For your arm pain, I would call your surgeon and tell him that you are still having a significant amount of pain 2 weeks out from your surgery.  For your BP, please continue taking your BP medications every day as prescribed. I would also like for you to write down your BP readings every time you are able to get them.  Please return to our clinic in about a week when we can re-assess your BP.

## 2017-11-29 NOTE — Telephone Encounter (Signed)
Agree. Jane [name]

## 2017-11-29 NOTE — Assessment & Plan Note (Addendum)
Assessment Status post superficialization of right brachial cephalic arteriovenous fistula on 1/2. She states her pain was well controlled until the last couple days when she started having 8 out of 10 pain in her right upper extremity. She was given a prescription for 8 tablets of Percocet. She states she has only taken 2 tablets doesn't like to take them because it makes her drowsy. She has also tried Tylenol 1000 mg, which she states does not work as long and does not provide her as much relief. On exam, she has a healing clean and dry surgical site on her right upper extremity. No erythema, swelling, or warmth to the area. It is mildly tender to palpation.  I am not sure why she is still having pain 2 weeks out from her surgery. Her surgical site does not look infected. I advised her to contact her surgeon regarding the pain.  Plan - Follow up with surgeon regarding pain - Advised alternating Tylenol and Percocet every 8 hours when necessary for pain

## 2017-11-29 NOTE — Progress Notes (Signed)
   CC: Management of hypertension  HPI:  [name] is a 42 y.o. female with PMH significant for hypertension, CAD s/p stent, CKD, asthma, and IDDM who presents for management of hypertension.  She recently had surgery for superficialization of right brachiocephalic arteriovenous fistula on 1/2. She went to see Dr. Hollie Salk last week and reports her systolic blood pressure was 122 at that time. Home health nurse went to visit patient yesterday and noted patient's blood pressure was elevated to 159/111. Patient's current blood pressure regimen includes carvedilol 25 mg twice a day, diltiazem 240 mg daily, and furosemide 40 mg daily. She reports compliance with these medications, however she states she did not take her medications this morning because "it wasn't time for them yet."  She reports occasional 8 out of 10 in severity burning/aching pain in her right upper extremity. This just started happening recently. She has a prescription for 8 tablets of Percocet, she has taken 2. She states she doesn't like taking the Percocet because it makes her drowsy. She has also tried Tylenol 1000 mg, which she states does not work as long and does not provide her as much relief.  She endorses a slight headache currently. Denies blurry vision, chest pain, palpitations, or shortness of breath.  Past Medical History:  Diagnosis Date  . Asthma    prn inhaler  . CAD S/P BMS PCI to prox LAD 03/31/2016   Ost LAD to Mid LAD lesion, 90% stenosed. Post intervention - Vision BMS 3.0 mm x 18 mm (~3.5 mm) there is a 0% residual stenosis.   . Charcot foot due to diabetes mellitus ([place]) 11/2016   left  . Chronic kidney disease    Stage 4  . Depression   . Diabetic neuropathy (HCC)    feet  . H/O non-ST elevation myocardial infarction (NSTEMI) 03/2016   Found in 9% mid LAD lesion treated with bare-metal stent (BMS) PCI - vision BMS 3.0 mm x 18 mm  . History of MRSA infection   . Hyperlipidemia   .  Hypertension    medication dose increased 11/22/2015; has been taking med. consistently since 03/2016, per pt.  . Insulin dependent diabetes mellitus ([place])   . Iron deficiency anemia    takes iron supplement  . Pneumonia   . Retinopathy of both eyes   . Sickle cell trait (Jane [name])   . 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 No blurry vision. CV: No chest pain. Minimal edema PULM: No shortness of breath MSK: Positive for 8/10 pain in left upper extremity  Physical Exam:  Vitals:   11/29/17 0944  BP: (!) 181/89  Pulse: 87  Temp: 98.8 F (37.1 C)  TempSrc: Oral  SpO2: 100%  Weight: 238 lb 4.8 oz (108.1 kg)   GEN: Sitting in chair in NAD CV: NR & RR, no m/r/g PULM: CTAB LEFT ARM: Clean dry and healing incision site on left upper arm. No surrounding erythema or swelling. Slightly tender to palpation. MSK: Left LE in boot. Minimal LE edema bilaterally  Assessment & Plan:   See Encounters Tab for problem based charting.  Patient discussed with Dr. Ellison Carwin, MD Internal Medicine, PGY-1

## 2017-11-29 NOTE — Telephone Encounter (Signed)
Spoke w/ pt, she states that the surgeon's office told her to call pcp for pain med refills, she has not had her surg f/u visit as of yet, it is scheduled in feb, she was informed to call surgeon's office back and ask to be seen this week for unexpected/ increased surg discomfort, do you agree with this advisement? She states she has now taken 3 of the percocet that she was prescribed by the surg.

## 2017-11-30 ENCOUNTER — Telehealth: Payer: Self-pay

## 2017-11-30 ENCOUNTER — Telehealth: Payer: Self-pay | Admitting: Internal Medicine

## 2017-11-30 NOTE — Progress Notes (Signed)
    Postoperative Access Visit   History of Present Illness   Jane [name] is a 42 y.o. year old female who presents for postoperative follow-up for: right brachiocephalic arteriovenous fistula superficialization, SBL (Date: 11/15/17).  The patient's wounds are healing.  The patient notes neuropathic sx with burning in R arm.  The patient is not able to complete their activities of daily living.  The patient's current symptoms are: pain.  Pt has not been taking her narcotics due to side effects: some clouding of thought.   Physical Examination   Vitals:   12/01/17 1518  BP: (!) 156/88  Pulse: 84  Resp: 18  Temp: 99.7 F (37.6 C)  TempSrc: Oral  SpO2: 100%  Weight: 240 lb 12.8 oz (109.2 kg)  Height: 5\' 5"  (1.651 m)    right arm Incision is healing dermabond starting peel, skin feels warm, hand grip is 5/5, sensation in digits is intact, easily palpable thrill, bruit can be auscultated     Medical Decision Making   Jane [name] is a 42 y.o. year old female who presents s/p right brachiocephalic arteriovenous fistula superficialization, SBL   There isn't a named sensory that follows the cephalic vein, so I doubt the surgery is the cause of this patient's neuropathic sx.  We discussed cubital tunnel syndrome vs brachial plexus stretch vs C-spine disc disease as possible etiologies.    The patient is going to try to alter her sleeping pattern on her R arm.  I gave the patient Ultram 50 mg 1 PO q6 hr #20 no refills to see if this will help her sx.  She has follow up schedule with me in 2-3 weeks.  Thank you for allowing [place] to participate in this patient's care.   Adele Barthel, MD, FACS [facility] Office: [phone] Pager: [phone]

## 2017-11-30 NOTE — Telephone Encounter (Signed)
Jane [name] from [facility] [phone]: check patient blood pressure today and it was elevated 1st: 160/100 2nd: 160/96, reading was almost 53mins apart

## 2017-11-30 NOTE — Telephone Encounter (Signed)
Returned patient call. She was unable to get pain medication from PCP and is still experiencing pain, 8 out of 10 on pain scale. She has an appointment to come in tomorrow to see Dr. Bridgett Larsson at 3:00 pm. Was told to call if anything changed or symptoms worsened.

## 2017-11-30 NOTE — Telephone Encounter (Signed)
Spoke w/ HHN, confirmed appt for f/u bp 1/23, she states pt is taking meds as directed and denies chest pain, shortness of breath, h/a, N&V Pt will be seen for her increased pain tomorrow at dr Lianne Moris office

## 2017-11-30 NOTE — Telephone Encounter (Signed)
Thank you!  Jane [name] 

## 2017-12-01 ENCOUNTER — Ambulatory Visit (INDEPENDENT_AMBULATORY_CARE_PROVIDER_SITE_OTHER): Payer: Medicaid Other | Admitting: Vascular Surgery

## 2017-12-01 ENCOUNTER — Encounter: Payer: Self-pay | Admitting: Vascular Surgery

## 2017-12-01 VITALS — BP 156/88 | HR 84 | Temp 99.7°F | Resp 18 | Ht 65.0 in | Wt 240.8 lb

## 2017-12-01 DIAGNOSIS — E1122 Type 2 diabetes mellitus with diabetic chronic kidney disease: Secondary | ICD-10-CM

## 2017-12-01 DIAGNOSIS — N185 Chronic kidney disease, stage 5: Secondary | ICD-10-CM

## 2017-12-01 MED ORDER — TRAMADOL HCL 50 MG PO TABS: 50.0000 mg | ORAL_TABLET | Freq: Four times a day (QID) | ORAL | 0 refills | Status: DC | PRN

## 2017-12-01 MED FILL — traMADol HCL 50 MG TABS: 50 | 5 days supply | Qty: 20 | Fill #0

## 2017-12-01 MED FILL — CARTIA XT 240 MG CAPSULE: 240 | 20 days supply | Qty: 20 | Fill #2

## 2017-12-06 ENCOUNTER — Other Ambulatory Visit: Payer: Self-pay

## 2017-12-06 ENCOUNTER — Encounter: Payer: Self-pay | Admitting: Internal Medicine

## 2017-12-06 ENCOUNTER — Ambulatory Visit (INDEPENDENT_AMBULATORY_CARE_PROVIDER_SITE_OTHER): Payer: Medicaid Other | Admitting: Internal Medicine

## 2017-12-06 VITALS — BP 173/87 | HR 83 | Temp 98.0°F | Ht 65.0 in | Wt 241.0 lb

## 2017-12-06 DIAGNOSIS — F339 Major depressive disorder, recurrent, unspecified: Secondary | ICD-10-CM | POA: Diagnosis not present

## 2017-12-06 DIAGNOSIS — Z79891 Long term (current) use of opiate analgesic: Secondary | ICD-10-CM

## 2017-12-06 DIAGNOSIS — M79601 Pain in right arm: Secondary | ICD-10-CM

## 2017-12-06 DIAGNOSIS — Z955 Presence of coronary angioplasty implant and graft: Secondary | ICD-10-CM

## 2017-12-06 DIAGNOSIS — E113493 Type 2 diabetes mellitus with severe nonproliferative diabetic retinopathy without macular edema, bilateral: Secondary | ICD-10-CM

## 2017-12-06 DIAGNOSIS — N185 Chronic kidney disease, stage 5: Secondary | ICD-10-CM | POA: Diagnosis not present

## 2017-12-06 DIAGNOSIS — Z794 Long term (current) use of insulin: Secondary | ICD-10-CM

## 2017-12-06 DIAGNOSIS — I770 Arteriovenous fistula, acquired: Secondary | ICD-10-CM | POA: Diagnosis not present

## 2017-12-06 DIAGNOSIS — J45909 Unspecified asthma, uncomplicated: Secondary | ICD-10-CM

## 2017-12-06 DIAGNOSIS — Z87891 Personal history of nicotine dependence: Secondary | ICD-10-CM

## 2017-12-06 DIAGNOSIS — I10 Essential (primary) hypertension: Secondary | ICD-10-CM

## 2017-12-06 DIAGNOSIS — E11610 Type 2 diabetes mellitus with diabetic neuropathic arthropathy: Secondary | ICD-10-CM | POA: Diagnosis not present

## 2017-12-06 DIAGNOSIS — I120 Hypertensive chronic kidney disease with stage 5 chronic kidney disease or end stage renal disease: Secondary | ICD-10-CM

## 2017-12-06 DIAGNOSIS — E1122 Type 2 diabetes mellitus with diabetic chronic kidney disease: Secondary | ICD-10-CM | POA: Diagnosis not present

## 2017-12-06 DIAGNOSIS — E785 Hyperlipidemia, unspecified: Secondary | ICD-10-CM

## 2017-12-06 DIAGNOSIS — J309 Allergic rhinitis, unspecified: Secondary | ICD-10-CM

## 2017-12-06 DIAGNOSIS — I2510 Atherosclerotic heart disease of native coronary artery without angina pectoris: Secondary | ICD-10-CM

## 2017-12-06 DIAGNOSIS — I2584 Coronary atherosclerosis due to calcified coronary lesion: Secondary | ICD-10-CM

## 2017-12-06 DIAGNOSIS — Z79899 Other long term (current) drug therapy: Secondary | ICD-10-CM | POA: Diagnosis not present

## 2017-12-06 DIAGNOSIS — F322 Major depressive disorder, single episode, severe without psychotic features: Secondary | ICD-10-CM

## 2017-12-06 LAB — POCT GLYCOSYLATED HEMOGLOBIN (HGB A1C): Hemoglobin A1C: 7.2

## 2017-12-06 LAB — GLUCOSE, CAPILLARY: Glucose-Capillary: 152 mg/dL — ABNORMAL HIGH (ref 65–99)

## 2017-12-06 MED ORDER — CLOPIDOGREL BISULFATE 75 MG PO TABS: 75.0000 mg | ORAL_TABLET | Freq: Every day | ORAL | 3 refills | Status: DC

## 2017-12-06 MED ORDER — CARVEDILOL 25 MG PO TABS: 25.0000 mg | ORAL_TABLET | Freq: Two times a day (BID) | ORAL | 3 refills | Status: DC

## 2017-12-06 MED ORDER — LORATADINE 10 MG PO TABS: 10.0000 mg | ORAL_TABLET | Freq: Every day | ORAL | 0 refills | Status: DC

## 2017-12-06 MED ORDER — FUROSEMIDE 80 MG PO TABS: 80.0000 mg | ORAL_TABLET | Freq: Every day | ORAL | 5 refills | Status: DC

## 2017-12-06 MED FILL — FUROSEMIDE 80 MG TABLET: 80 | 30 days supply | Qty: 30 | Fill #0

## 2017-12-06 MED FILL — CARVEDILOL 25 MG TABLET: 25 | 30 days supply | Qty: 60 | Fill #0

## 2017-12-06 NOTE — Progress Notes (Signed)
CC: hypertension  HPI:  [name] is a 42 y.o. 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  HTN: Patient endorses compliance with coreg 25mg  BID, diltiazem 240mg  daily, and furosemide 40mg  daily. She denies chest pain, shortness of breath, headaches, LE swelling, vision or hearing changes, or focal weakness. She states that prior to her RUE AVF superficialization surgery, her BP had been well controlled, however about 2-3 days afterwards, she had noticed home SBPs ~150/160s.   MDD: Patient states that she has not taken any zoloft in about 2 weeks due to concern of medication interaction with tramadol (though this was only started a few days ago by vascular surgery). She does state she noticed improvement of her depressive symptoms including improvement in sleep, in mood, and she had more willpower to perform hygiene.   T2DM: Patient endorses compliance with Lantus 12 unints qhs, Novolog 6 units TID with meals. She has not been checking her CBGs due to her meter not working and running out of strips for her other meter. She denies polydipsia, polyuria, nausea, vomiting, blurry vision. She denies episodes of feeling hypoglycemic.   Please see problem based Assessment and Plan for status of patients chronic conditions.  Past Medical History:  Diagnosis Date  . Asthma    prn inhaler  . CAD S/P BMS PCI to prox LAD 03/31/2016   Ost LAD to Mid LAD lesion, 90% stenosed. Post intervention - Vision BMS 3.0 mm x 18 mm (~3.5 mm) there is a 0% residual stenosis.   . Charcot foot due to diabetes mellitus ([place]) 11/2016   left  . Chronic kidney disease    Stage 4  . Depression   . Diabetic neuropathy (HCC)    feet  . H/O non-ST elevation myocardial infarction (NSTEMI) 03/2016   Found in 9% mid LAD lesion treated with bare-metal stent (BMS) PCI - vision BMS 3.0 mm x 18  mm  . History of MRSA infection   . Hyperlipidemia   . Hypertension    medication dose increased 11/22/2015; has been taking med. consistently since 03/2016, per pt.  . Insulin dependent diabetes mellitus ([place])   . Iron deficiency anemia    takes iron supplement  . Pneumonia   . Retinopathy of both eyes   . Sickle cell trait ([place])   . 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 HEENT-  EOMI, oral mucosa appears moist CARDIAC- RRR, no murmurs, rubs or gallops. RESP- Moving equal volumes of air, and clear to auscultation bilaterally, no wheezes or crackles. ABDOMEN- Soft, nontender, bowel sounds present. NEURO- No obvious Cr N abnormality. EXTREMITIES- pulse 2+ radial, symmetric, UE strength and sensation intact, RUE incision clean/dry and healing well; 1+ pitting edema bil to knees; L foot in Charcot boot SKIN- Warm, dry. PSYCH- Normal mood and affect, appropriate thought content and speech.  Assessment & Plan:   See Encounters Tab for problem based charting.   Patient discussed with  Dr. Angelia Mould   Alphonzo Grieve, MD Internal Medicine PGY2

## 2017-12-06 NOTE — Progress Notes (Signed)
Internal Medicine Clinic Attending  Case discussed with Dr. Huang at the time of the visit.  We reviewed the resident's history and exam and pertinent patient test results.  I agree with the assessment, diagnosis, and plan of care documented in the resident's note. 

## 2017-12-06 NOTE — Patient Instructions (Addendum)
Please call the clinic in the morning to talk to [name] about your meter. When able, start checking your sugars 3-4 times per day.  Increase your lasix to 80mg  daily (I sent new script to pharmacy); come back in 2 weeks for re-evaluation and check on your blood pressure.  I would take only tylenol and use heat/ice on your arm to help with the pain. Restart taking your zoloft 100mg  daily.

## 2017-12-07 ENCOUNTER — Telehealth: Payer: Self-pay

## 2017-12-07 ENCOUNTER — Telehealth: Payer: Self-pay | Admitting: *Deleted

## 2017-12-07 DIAGNOSIS — Z79899 Other long term (current) drug therapy: Secondary | ICD-10-CM

## 2017-12-07 DIAGNOSIS — Z9861 Coronary angioplasty status: Secondary | ICD-10-CM

## 2017-12-07 DIAGNOSIS — E785 Hyperlipidemia, unspecified: Principal | ICD-10-CM

## 2017-12-07 DIAGNOSIS — I2510 Atherosclerotic heart disease of native coronary artery without angina pectoris: Secondary | ICD-10-CM

## 2017-12-07 DIAGNOSIS — E1169 Type 2 diabetes mellitus with other specified complication: Secondary | ICD-10-CM

## 2017-12-07 NOTE — Telephone Encounter (Signed)
Called HHN back, bp this am- taken only once and HHN has now left the home, 194/108. HHN states pt has not had her am meds yet and she did not know if pt has started her increased dose of lasix. She states she did remind pt to call 911 or go to ED if she has chest pain, short of breath, severe H/A, N&V, weakness. She will call pt and remind her to take correct dose of lasix. Also ask her to continue to encourage pt to come for f/u in 2 weeks

## 2017-12-07 NOTE — Telephone Encounter (Signed)
Jane [name] with [facility] want to reported pt bp is 194/108. Please call back.

## 2017-12-07 NOTE — Telephone Encounter (Signed)
Mailed letter and lab slip for  Lipid ,liver function  patient needs follow up appointment in feb 2019

## 2017-12-07 NOTE — Telephone Encounter (Signed)
-----   Message from Raiford Simmonds, RN sent at 07/04/2017 10:16 AM EDT ----- LABS LIPID ,LIVER DUE IN 6 MONTHS FEB 21,2019   WILL MAIL IN 5 MONTHS

## 2017-12-08 ENCOUNTER — Encounter: Payer: Self-pay | Admitting: Internal Medicine

## 2017-12-08 ENCOUNTER — Ambulatory Visit (HOSPITAL_COMMUNITY)
Admission: RE | Admit: 2017-12-08 | Discharge: 2017-12-08 | Disposition: A | Discharge status: Home / Self Care | Payer: Medicaid Other | Source: Ambulatory Visit | Attending: Nephrology | Admitting: Nephrology

## 2017-12-08 VITALS — BP 122/71 | HR 86 | Resp 18

## 2017-12-08 DIAGNOSIS — E1122 Type 2 diabetes mellitus with diabetic chronic kidney disease: Secondary | ICD-10-CM | POA: Diagnosis not present

## 2017-12-08 DIAGNOSIS — N185 Chronic kidney disease, stage 5: Secondary | ICD-10-CM | POA: Diagnosis present

## 2017-12-08 LAB — IRON AND TIBC
Iron: 36 ug/dL (ref 28–170)
SATURATION RATIOS: 15 % (ref 10.4–31.8)
TIBC: 241 ug/dL — ABNORMAL LOW (ref 250–450)
UIBC: 205 ug/dL

## 2017-12-08 LAB — POCT HEMOGLOBIN-HEMACUE: Hemoglobin: 10.9 g/dL — ABNORMAL LOW (ref 12.0–15.0)

## 2017-12-08 LAB — FERRITIN: FERRITIN: 48 ng/mL (ref 11–307)

## 2017-12-08 MED ORDER — DARBEPOETIN ALFA 200 MCG/0.4ML IJ SOSY: 200.0000 ug | PREFILLED_SYRINGE | INTRAMUSCULAR | Status: DC

## 2017-12-08 MED ORDER — DARBEPOETIN ALFA 200 MCG/0.4ML IJ SOSY
PREFILLED_SYRINGE | INTRAMUSCULAR | Status: AC
  Administered 2017-12-08: 200 ug via SUBCUTANEOUS
  Filled 2017-12-08: qty 0.4

## 2017-12-08 NOTE — Telephone Encounter (Signed)
I agree.  Thank you!  Estill Dooms

## 2017-12-08 NOTE — Assessment & Plan Note (Signed)
Patient appears mildly fluid overloaded on exam with pitting edema in bil LEs. Likely 2/2 inadequate lasix dose for her degree of renal dysfunction.  She is not having uremic symptoms.  She had R UE AVF superficialization procedure 11/15/17.  Plan: --increase lasix to 80mg  daily --f/u in 2 weeks

## 2017-12-08 NOTE — Assessment & Plan Note (Addendum)
BP uncontrolled; patient with 1+ pitting edema in bil LEs. No s/s of hypertensive urgency/emergency. Patient endorses not having noticed increased urination after taking her lasix, which may indicate insufficient dose 2/2 to her CKD.  Plan: --lasix 80mg  daily --continue coreg 25mg  BID, diltiazem 240mg  daily --f/u in 2 weeks

## 2017-12-08 NOTE — Assessment & Plan Note (Signed)
Patient states that she has not taken any zoloft in about 2 weeks due to concern of medication interaction with tramadol (though this was only started a few days ago by vascular surgery). She does state she noticed improvement of her depressive symptoms including improvement in sleep, in mood, and she had more willpower to perform hygiene.  Plan: --will restart zoloft as patient does not want to take tramadol any longer due to SE.

## 2017-12-08 NOTE — Assessment & Plan Note (Addendum)
Patient with RUE pain at site of vascular surgery. She was evaluated by Dr. Bridgett Larsson due to continued pain and AVF appeared to be functioning well and no evidence of steal syndrome or thrombosis apparent.  Today she states her pain is improving after adjusting her positioning per Dr. Lianne Moris suggestions. She does not like how tramadol or oxycodone make her feel with increase drowsiness.   On exam, her radial pulses are 2+ and symmetric, strength/sensation intact and incision site appears to be healing well.  Plan: --advised use of tylenol 1000mg  TID, ice/heat --will stop tramadol

## 2017-12-08 NOTE — Assessment & Plan Note (Signed)
Patient endorses compliance with Lantus 12 unints qhs, Novolog 6 units TID with meals. She has not been checking her CBGs due to her meter not working and running out of strips for her other meter. She denies polydipsia, polyuria, nausea, vomiting, blurry vision. She denies episodes of feeling hypoglycemic.   Plan: --asked patient to call in AM to speak with DM educator to help with supply management; asked her to have all her meters with her so she can provide accurate information on what she actually needs. --continue current regimen as no info available for adjustment --A1c today 7.2; may not always be accurate with CKD --f/u in 2 weeks with glucometer

## 2017-12-11 NOTE — Progress Notes (Signed)
Internal Medicine Clinic Attending  Case discussed with Dr. Svalina  at the time of the visit.  We reviewed the resident's history and exam and pertinent patient test results.  I agree with the assessment, diagnosis, and plan of care documented in the resident's note.  

## 2017-12-11 NOTE — Progress Notes (Signed)
    Postoperative Access Visit   History of Present Illness   Jane [name] is a 42 y.o. year old female who presents for postoperative follow-up for: right brachiocephalic arteriovenous fistula superficialization (Date: 11/15/17).  The patient's wounds are healed.  The patient notes no steal symptoms.  The patient is able to complete their activities of daily living.  The patient's current symptoms are: none.   Physical Examination   Vitals:   12/15/17 0845 12/15/17 0849  BP: (!) 166/84 (!) 164/87  Pulse: 83 83  Resp: 16   Temp: 97.8 F (36.6 C)   TempSrc: Oral   SpO2: 100%   Weight: 240 lb (108.9 kg)   Height: 5\' 5"  (1.651 m)    right arm Incision is healed, some residual scab vs dermabond, skin feels warm, hand grip is 5/5, sensation in digits is intact, palpable thrill, bruit can be auscultated     Medical Decision Making   Jane [name] is a 43 y.o. year old female who presents s/p right brachiocephalic arteriovenous fistula superficialization   The patient's access is ready for use.  Thank you for allowing [place] to participate in this patient's care.   Adele Barthel, MD, FACS [facility] Office: [phone] Pager: [phone]

## 2017-12-15 ENCOUNTER — Other Ambulatory Visit: Payer: Self-pay

## 2017-12-15 ENCOUNTER — Ambulatory Visit (INDEPENDENT_AMBULATORY_CARE_PROVIDER_SITE_OTHER): Payer: Self-pay | Admitting: Vascular Surgery

## 2017-12-15 ENCOUNTER — Encounter: Payer: Self-pay | Admitting: Vascular Surgery

## 2017-12-15 VITALS — BP 164/87 | HR 83 | Temp 97.8°F | Resp 16 | Ht 65.0 in | Wt 240.0 lb

## 2017-12-15 DIAGNOSIS — N186 End stage renal disease: Secondary | ICD-10-CM

## 2017-12-15 DIAGNOSIS — E1122 Type 2 diabetes mellitus with diabetic chronic kidney disease: Secondary | ICD-10-CM

## 2017-12-15 DIAGNOSIS — N185 Chronic kidney disease, stage 5: Secondary | ICD-10-CM

## 2017-12-15 DIAGNOSIS — Z992 Dependence on renal dialysis: Secondary | ICD-10-CM

## 2017-12-15 HISTORY — DX: Dependence on renal dialysis: Z99.2

## 2017-12-15 HISTORY — DX: End stage renal disease: N18.6

## 2017-12-15 MED FILL — CLOPIDOGREL 75 MG TABLET: 75 | 30 days supply | Qty: 30 | Fill #0

## 2017-12-15 MED FILL — MEGESTROL 40 MG TABLET: 40 | 30 days supply | Qty: 30 | Fill #3

## 2017-12-21 ENCOUNTER — Encounter: Payer: Self-pay | Admitting: Internal Medicine

## 2017-12-21 ENCOUNTER — Ambulatory Visit: Payer: Medicaid Other | Admitting: Internal Medicine

## 2017-12-21 ENCOUNTER — Other Ambulatory Visit: Payer: Self-pay

## 2017-12-21 ENCOUNTER — Other Ambulatory Visit (HOSPITAL_COMMUNITY): Payer: Self-pay

## 2017-12-21 VITALS — BP 135/68 | HR 86 | Temp 99.0°F | Ht 65.0 in | Wt 241.6 lb

## 2017-12-21 DIAGNOSIS — I10 Essential (primary) hypertension: Secondary | ICD-10-CM

## 2017-12-21 NOTE — Assessment & Plan Note (Signed)
Patient presents for hypertension follow-up.  She was last seen on 1/25 at which time her Lasix dose was increased to 80 mg daily due to elevated blood pressure after vascular surgery for RUE AVF.  She reports compliance of medication and is currently taking Lasix 80, diltiazem 240 mg, and Coreg 25 BID.  BP at goal today.  Patient follows up with nephrology frequently and states her BP has been at goal as well during her nephrology visits.  - Continue current regimen  - BMP

## 2017-12-21 NOTE — Patient Instructions (Signed)
It was nice to meet you today, Ms. Farrey.   Your blood pressure looks great.  We will not make any changes to her medications.  We checked blood work today.  I will call you with the results are not normal.  Please call if you have any questions.

## 2017-12-21 NOTE — Progress Notes (Signed)
   CC: HTN follow up   HPI:  [name] is a 42 y.o.   Past Medical History:  Diagnosis Date  . Asthma    prn inhaler  . CAD S/P BMS PCI to prox LAD 03/31/2016   Ost LAD to Mid LAD lesion, 90% stenosed. Post intervention - Vision BMS 3.0 mm x 18 mm (~3.5 mm) there is a 0% residual stenosis.   . Charcot foot due to diabetes mellitus ([place]) 11/2016   left  . Chronic kidney disease    Stage 4  . Depression   . Diabetic neuropathy (HCC)    feet  . H/O non-ST elevation myocardial infarction (NSTEMI) 03/2016   Found in 9% mid LAD lesion treated with bare-metal stent (BMS) PCI - vision BMS 3.0 mm x 18 mm  . History of MRSA infection   . Hyperlipidemia   . Hypertension    medication dose increased 11/22/2015; has been taking med. consistently since 03/2016, per pt.  . Insulin dependent diabetes mellitus ([place])   . Iron deficiency anemia    takes iron supplement  . Retinopathy of both eyes   . Sickle cell trait ([place])   . 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  Respiratory: Negative for cough and shortness of breath.   Cardiovascular: Positive for leg swelling. Negative for chest pain and palpitations.  Gastrointestinal: Negative for abdominal pain, constipation, diarrhea, nausea and vomiting.  Neurological: Negative for dizziness and headaches.    Physical Exam:  Vitals:   12/21/17 1441  BP: 135/68  Pulse: 86  Temp: 99 F (37.2 C)  TempSrc: Oral  SpO2: 100%  Weight: 241 lb 9.6 oz (109.6 kg)  Height: 5\' 5"  (1.651 m)   General: Very pleasant female, obese, well-developed, in no acute distress Cardiac: regular rate and rhythm, nl S1/S2, no murmurs, rubs or gallops  Pulm: CTAB, no wheezes or crackles, no increased work of breathing  Ext: warm and well perfused, trace peripheral edema on RLE, wearing orthopedic boot on LLE     Assessment & Plan:   See Encounters Tab for problem based charting.  Patient discussed with Dr. Evette Doffing

## 2017-12-22 ENCOUNTER — Ambulatory Visit (HOSPITAL_COMMUNITY)
Admission: RE | Admit: 2017-12-22 | Discharge: 2017-12-22 | Disposition: A | Discharge status: Home / Self Care | Payer: Medicaid Other | Source: Ambulatory Visit | Attending: Nephrology | Admitting: Nephrology

## 2017-12-22 VITALS — BP 145/71 | HR 81 | Temp 98.4°F | Resp 18 | Ht 65.0 in | Wt 241.0 lb

## 2017-12-22 DIAGNOSIS — N189 Chronic kidney disease, unspecified: Secondary | ICD-10-CM | POA: Diagnosis not present

## 2017-12-22 DIAGNOSIS — E1122 Type 2 diabetes mellitus with diabetic chronic kidney disease: Secondary | ICD-10-CM

## 2017-12-22 DIAGNOSIS — D631 Anemia in chronic kidney disease: Secondary | ICD-10-CM | POA: Diagnosis not present

## 2017-12-22 DIAGNOSIS — N185 Chronic kidney disease, stage 5: Secondary | ICD-10-CM

## 2017-12-22 LAB — BMP8+ANION GAP
Anion Gap: 18 mmol/L (ref 10.0–18.0)
BUN/Creatinine Ratio: 8 — ABNORMAL LOW (ref 9–23)
BUN: 69 mg/dL — AB (ref 6–24)
CO2: 17 mmol/L — AB (ref 20–29)
Calcium: 8.1 mg/dL — ABNORMAL LOW (ref 8.7–10.2)
Chloride: 107 mmol/L — ABNORMAL HIGH (ref 96–106)
Creatinine, Ser: 8.86 mg/dL — ABNORMAL HIGH (ref 0.57–1.00)
GFR, EST AFRICAN AMERICAN: 6 mL/min/{1.73_m2} — AB (ref >59)
GFR, EST NON AFRICAN AMERICAN: 5 mL/min/{1.73_m2} — AB (ref >59)
GLUCOSE: 237 mg/dL — AB (ref 65–99)
Potassium: 5.2 mmol/L (ref 3.5–5.2)
Sodium: 142 mmol/L (ref 134–144)

## 2017-12-22 LAB — POCT HEMOGLOBIN-HEMACUE: Hemoglobin: 11.1 g/dL — ABNORMAL LOW (ref 12.0–15.0)

## 2017-12-22 MED ORDER — DARBEPOETIN ALFA 200 MCG/0.4ML IJ SOSY
PREFILLED_SYRINGE | INTRAMUSCULAR | Status: AC
  Administered 2017-12-22: 200 ug
  Filled 2017-12-22: qty 0.4

## 2017-12-22 MED ORDER — DARBEPOETIN ALFA 200 MCG/0.4ML IJ SOSY: 200.0000 ug | PREFILLED_SYRINGE | INTRAMUSCULAR | Status: DC

## 2017-12-22 MED ORDER — SODIUM CHLORIDE 0.9 % IV SOLN
510.0000 mg | Freq: Once | INTRAVENOUS | Status: AC
  Administered 2017-12-22: 510 mg via INTRAVENOUS
  Filled 2017-12-22: qty 17

## 2017-12-22 MED FILL — CARTIA XT 240 MG CAPSULE: 240 | 12 days supply | Qty: 12 | Fill #3

## 2017-12-22 NOTE — Progress Notes (Signed)
Internal Medicine Clinic Attending  Case discussed with Dr. Santos-Sanchez at the time of the visit.  We reviewed the resident's history and exam and pertinent patient test results.  I agree with the assessment, diagnosis, and plan of care documented in the resident's note.    

## 2017-12-28 MED FILL — SERTRALINE HCL 100 MG TAB: 100 | 30 days supply | Qty: 30 | Fill #1

## 2018-01-05 ENCOUNTER — Encounter (HOSPITAL_COMMUNITY)
Admission: RE | Admit: 2018-01-05 | Discharge: 2018-01-05 | Disposition: A | Discharge status: Home / Self Care | Payer: Medicaid Other | Source: Ambulatory Visit | Attending: Nephrology | Admitting: Nephrology

## 2018-01-05 VITALS — BP 149/86 | HR 90 | Temp 97.9°F | Resp 18

## 2018-01-05 DIAGNOSIS — D509 Iron deficiency anemia, unspecified: Secondary | ICD-10-CM | POA: Insufficient documentation

## 2018-01-05 DIAGNOSIS — N185 Chronic kidney disease, stage 5: Secondary | ICD-10-CM

## 2018-01-05 DIAGNOSIS — E1122 Type 2 diabetes mellitus with diabetic chronic kidney disease: Secondary | ICD-10-CM

## 2018-01-05 LAB — FERRITIN: FERRITIN: 142 ng/mL (ref 11–307)

## 2018-01-05 LAB — IRON AND TIBC
IRON: 43 ug/dL (ref 28–170)
Saturation Ratios: 17 % (ref 10.4–31.8)
TIBC: 248 ug/dL — ABNORMAL LOW (ref 250–450)
UIBC: 205 ug/dL

## 2018-01-05 LAB — POCT HEMOGLOBIN-HEMACUE: HEMOGLOBIN: 11.6 g/dL — AB (ref 12.0–15.0)

## 2018-01-05 MED ORDER — DARBEPOETIN ALFA 200 MCG/0.4ML IJ SOSY
200.0000 ug | PREFILLED_SYRINGE | INTRAMUSCULAR | Status: DC
  Administered 2018-01-05: 200 ug via SUBCUTANEOUS

## 2018-01-05 MED ORDER — DARBEPOETIN ALFA 200 MCG/0.4ML IJ SOSY
PREFILLED_SYRINGE | INTRAMUSCULAR | Status: AC
  Administered 2018-01-05: 200 ug via SUBCUTANEOUS
  Filled 2018-01-05: qty 0.4

## 2018-01-05 MED FILL — FUROSEMIDE 80 MG TABLET: 80 | 30 days supply | Qty: 30 | Fill #1

## 2018-01-05 MED FILL — CARTIA XT 240 MG CAPSULE: 240 | 90 days supply | Qty: 90 | Fill #4

## 2018-01-05 MED FILL — CARVEDILOL 25 MG TABLET: 25 | 30 days supply | Qty: 60 | Fill #1

## 2018-01-06 LAB — HEPATITIS B SURFACE ANTIBODY,QUALITATIVE: Hep B S Ab: NONREACTIVE

## 2018-01-06 LAB — HEPATITIS B SURFACE ANTIGEN: Hepatitis B Surface Ag: NEGATIVE

## 2018-01-06 LAB — HEPATITIS B CORE ANTIBODY, TOTAL: HEP B C TOTAL AB: NEGATIVE

## 2018-01-09 MED FILL — LIDOCAINE-PRILOCAINE CREAM: 2.5-2.5 | 15 days supply | Qty: 30 | Fill #0

## 2018-01-10 DIAGNOSIS — R509 Fever, unspecified: Secondary | ICD-10-CM | POA: Insufficient documentation

## 2018-01-10 DIAGNOSIS — Z111 Encounter for screening for respiratory tuberculosis: Secondary | ICD-10-CM | POA: Insufficient documentation

## 2018-01-12 MED FILL — CLOPIDOGREL 75 MG TABLET: 75 | 30 days supply | Qty: 30 | Fill #1

## 2018-01-12 MED FILL — MEGESTROL 40 MG TABLET: 40 | 30 days supply | Qty: 30 | Fill #4

## 2018-01-18 ENCOUNTER — Other Ambulatory Visit: Payer: Self-pay | Admitting: Internal Medicine

## 2018-01-18 DIAGNOSIS — Z9861 Coronary angioplasty status: Principal | ICD-10-CM

## 2018-01-18 DIAGNOSIS — I2510 Atherosclerotic heart disease of native coronary artery without angina pectoris: Secondary | ICD-10-CM

## 2018-01-18 DIAGNOSIS — D689 Coagulation defect, unspecified: Secondary | ICD-10-CM | POA: Insufficient documentation

## 2018-01-18 NOTE — Telephone Encounter (Signed)
PT NEEDS REFILL LIPITOR 40 MG TO [facility]

## 2018-01-19 ENCOUNTER — Encounter (HOSPITAL_COMMUNITY): Payer: Self-pay

## 2018-01-19 DIAGNOSIS — Z23 Encounter for immunization: Secondary | ICD-10-CM | POA: Insufficient documentation

## 2018-01-19 MED ORDER — ATORVASTATIN CALCIUM 40 MG PO TABS: 40.0000 mg | ORAL_TABLET | Freq: Every day | ORAL | 3 refills | Status: DC

## 2018-01-29 MED FILL — SERTRALINE HCL 100 MG TAB: 100 | 30 days supply | Qty: 30 | Fill #2

## 2018-01-30 DIAGNOSIS — N2581 Secondary hyperparathyroidism of renal origin: Secondary | ICD-10-CM | POA: Insufficient documentation

## 2018-01-31 MED FILL — ACCU-CHEK GUIDE TEST STRIP: 30 days supply | Qty: 50 | Fill #1

## 2018-02-02 ENCOUNTER — Other Ambulatory Visit: Payer: Self-pay | Admitting: *Deleted

## 2018-02-02 DIAGNOSIS — Z794 Long term (current) use of insulin: Principal | ICD-10-CM

## 2018-02-02 DIAGNOSIS — E113493 Type 2 diabetes mellitus with severe nonproliferative diabetic retinopathy without macular edema, bilateral: Secondary | ICD-10-CM

## 2018-02-02 NOTE — Telephone Encounter (Signed)
Fax from [facility] - pt states she checks BS 4 times a day. Thanks

## 2018-02-03 MED ORDER — GLUCOSE BLOOD VI STRP
ORAL_STRIP | 11 refills | Status: DC
Start: 2018-02-03 — End: 2018-08-10

## 2018-02-07 ENCOUNTER — Telehealth: Payer: Self-pay | Admitting: *Deleted

## 2018-02-07 NOTE — Telephone Encounter (Signed)
Reviewed medlist w/ mental health csw

## 2018-02-12 ENCOUNTER — Ambulatory Visit (INDEPENDENT_AMBULATORY_CARE_PROVIDER_SITE_OTHER): Payer: Medicaid Other | Admitting: Internal Medicine

## 2018-02-12 DIAGNOSIS — L732 Hidradenitis suppurativa: Secondary | ICD-10-CM

## 2018-02-12 DIAGNOSIS — Z8614 Personal history of Methicillin resistant Staphylococcus aureus infection: Secondary | ICD-10-CM

## 2018-02-12 DIAGNOSIS — E1122 Type 2 diabetes mellitus with diabetic chronic kidney disease: Secondary | ICD-10-CM | POA: Diagnosis not present

## 2018-02-12 DIAGNOSIS — Z955 Presence of coronary angioplasty implant and graft: Secondary | ICD-10-CM

## 2018-02-12 DIAGNOSIS — N186 End stage renal disease: Secondary | ICD-10-CM

## 2018-02-12 DIAGNOSIS — D631 Anemia in chronic kidney disease: Secondary | ICD-10-CM | POA: Insufficient documentation

## 2018-02-12 DIAGNOSIS — I120 Hypertensive chronic kidney disease with stage 5 chronic kidney disease or end stage renal disease: Secondary | ICD-10-CM | POA: Diagnosis not present

## 2018-02-12 DIAGNOSIS — Z8631 Personal history of diabetic foot ulcer: Secondary | ICD-10-CM | POA: Diagnosis not present

## 2018-02-12 DIAGNOSIS — I2510 Atherosclerotic heart disease of native coronary artery without angina pectoris: Secondary | ICD-10-CM | POA: Diagnosis not present

## 2018-02-12 MED ORDER — CLINDAMYCIN PHOSPHATE 1 % EX GEL: Freq: Two times a day (BID) | CUTANEOUS | 0 refills | Status: DC

## 2018-02-12 NOTE — Patient Instructions (Signed)
It was nice seeing you today. I'm sorry you are dealing with this boil. The best treatment for this is a topical antibiotic cream. Please apply this twice daily for 14 days to the affected area. It will take some time for this to resolve, but please let the clinic know if it has not improved after 2 weeks. Please do not try to lance this on your own.

## 2018-02-12 NOTE — Progress Notes (Signed)
   CC: acute complaint of boil  HPI:  [name] is a 42 y.o. 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 Noticed a firm somewhat tender lump under her braline a few weeks ago that has persisted. Has grown in size and become more tender but pain mostly controlled with Tylenol. No drainage. Has history of similar "boils" under her breasts, armpits and groin. Notes one was lanced and culture grew MRSA and was subsequently treated with antibiotics.   Past Medical History:  Diagnosis Date  . Asthma    prn inhaler  . CAD S/P BMS PCI to prox LAD 03/31/2016   Ost LAD to Mid LAD lesion, 90% stenosed. Post intervention - Vision BMS 3.0 mm x 18 mm (~3.5 mm) there is a 0% residual stenosis.   . Charcot foot due to diabetes mellitus ([place]) 11/2016   left  . Chronic kidney disease    Stage 4  . Depression   . Diabetic neuropathy (HCC)    feet  . H/O non-ST elevation myocardial infarction (NSTEMI) 03/2016   Found in 9% mid LAD lesion treated with bare-metal stent (BMS) PCI - vision BMS 3.0 mm x 18 mm  . History of MRSA infection   . Hyperlipidemia   . Hypertension    medication dose increased 11/22/2015; has been taking med. consistently since 03/2016, per pt.  . Insulin dependent diabetes mellitus ([place])   . Iron deficiency anemia    takes iron supplement  . Retinopathy of both eyes   . Sickle cell trait ([place])   . 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 Pleasant and conversant but  tangential speech. HEENT: No icterus, injection or ptosis. No hoarseness or dysarthria  Cardiac: RRR, no MGR appreciated Pulmonary: CTA BL with normal WOB on RA. Able to speak in complete sentences Abd: Soft, not distended. Small boil on right flank just under bra. No drainage, minimally tender to palpation.  Extremities: Warm, perfused. No significant pedal edema.    Vitals:   02/12/18 1359  BP: (!) 116/58  Pulse: 87  Temp: 98.2 F (36.8 C)  TempSrc: Oral  SpO2: 100%  Weight: 239 lb 12.8 oz (108.8 kg)   Assessment & Plan:   See Encounters Tab for problem based charting.  Patient discussed with Dr. Angelia Mould

## 2018-02-12 NOTE — Assessment & Plan Note (Deleted)
Given location and history of boils under breasts, axilla and groin I am suspicious for diagnosis of HS. She states these boils have sometimes been treated with antibiotics and had one that was lanced with culture growing MRSA. We discussed conservative measures including warm compresses, tylenol prn and to avoid lancing/picking at home. Will prescribe topical Clindamycin cream to apply twice daily x 14 days given her lack of systemic symptoms or multiple locations. Patient to call clinic should symptoms worsen.

## 2018-02-14 MED FILL — MEGESTROL 40 MG TABLET: 40 | 30 days supply | Qty: 30 | Fill #5

## 2018-02-14 MED FILL — CLOPIDOGREL 75 MG TABLET: 75 | 30 days supply | Qty: 30 | Fill #2

## 2018-02-14 MED FILL — ACCU-CHEK GUIDE TEST STRIP: 25 days supply | Qty: 100 | Fill #0

## 2018-02-15 NOTE — Assessment & Plan Note (Signed)
Given location and history of boils under breasts, axilla and groin I am suspicious for diagnosis of HS. She states these boils have sometimes been treated with antibiotics and had one that was lanced with culture growing MRSA. We discussed conservative measures including warm compresses, tylenol prn and to avoid lancing/picking at home. Will prescribe topical Clindamycin cream to apply twice daily x 14 days given her lack of systemic symptoms or multiple locations. Patient to call clinic should symptoms worsen.

## 2018-02-16 NOTE — Progress Notes (Signed)
Internal Medicine Clinic Attending  Case discussed with Dr. Molt at the time of the visit.  We reviewed the resident's history and exam and pertinent patient test results.  I agree with the assessment, diagnosis, and plan of care documented in the resident's note. 

## 2018-02-26 ENCOUNTER — Other Ambulatory Visit: Payer: Self-pay | Admitting: Internal Medicine

## 2018-02-26 ENCOUNTER — Telehealth: Payer: Self-pay | Admitting: *Deleted

## 2018-02-26 DIAGNOSIS — R0600 Dyspnea, unspecified: Secondary | ICD-10-CM | POA: Insufficient documentation

## 2018-02-26 DIAGNOSIS — R0609 Other forms of dyspnea: Secondary | ICD-10-CM | POA: Insufficient documentation

## 2018-02-26 DIAGNOSIS — R0602 Shortness of breath: Secondary | ICD-10-CM | POA: Insufficient documentation

## 2018-02-26 MED ORDER — CLINDAMYCIN PHOSPHATE 1 % EX LOTN: TOPICAL_LOTION | Freq: Two times a day (BID) | CUTANEOUS | 0 refills | Status: DC

## 2018-02-26 NOTE — Telephone Encounter (Signed)
Call to [facility] for PA for Clindamycin 1% Gel.  PA denied as patient will need to try other options.  List of alternatives was given to Dr. Danford Bad for consideration.  Sander Nephew, RN 02/26/2018 4:48 PM.

## 2018-03-01 NOTE — Telephone Encounter (Signed)
Pt needs to speak with a nurse about PA on Clindamycin. Please call pt back.

## 2018-03-02 MED FILL — NovoLOG 100 UNIT/ML SOLN: 100 | 28 days supply | Qty: 10 | Fill #1

## 2018-03-02 MED FILL — LANTUS 100 UNITS/ML VIAL: 100 | 28 days supply | Qty: 10 | Fill #1

## 2018-03-06 ENCOUNTER — Other Ambulatory Visit: Payer: Self-pay

## 2018-03-06 ENCOUNTER — Emergency Department (HOSPITAL_COMMUNITY)
Admission: EM | Admit: 2018-03-06 | Discharge: 2018-03-07 | Disposition: A | Discharge status: Home / Self Care | Payer: Medicaid Other | Attending: Emergency Medicine | Admitting: Emergency Medicine

## 2018-03-06 ENCOUNTER — Encounter (HOSPITAL_COMMUNITY): Payer: Self-pay

## 2018-03-06 DIAGNOSIS — D492 Neoplasm of unspecified behavior of bone, soft tissue, and skin: Secondary | ICD-10-CM

## 2018-03-06 DIAGNOSIS — Z79899 Other long term (current) drug therapy: Secondary | ICD-10-CM | POA: Insufficient documentation

## 2018-03-06 DIAGNOSIS — I120 Hypertensive chronic kidney disease with stage 5 chronic kidney disease or end stage renal disease: Secondary | ICD-10-CM | POA: Insufficient documentation

## 2018-03-06 DIAGNOSIS — Z794 Long term (current) use of insulin: Secondary | ICD-10-CM | POA: Insufficient documentation

## 2018-03-06 DIAGNOSIS — Z992 Dependence on renal dialysis: Secondary | ICD-10-CM | POA: Diagnosis not present

## 2018-03-06 DIAGNOSIS — Z87891 Personal history of nicotine dependence: Secondary | ICD-10-CM | POA: Diagnosis not present

## 2018-03-06 DIAGNOSIS — N186 End stage renal disease: Secondary | ICD-10-CM | POA: Diagnosis not present

## 2018-03-06 DIAGNOSIS — E1140 Type 2 diabetes mellitus with diabetic neuropathy, unspecified: Secondary | ICD-10-CM | POA: Diagnosis not present

## 2018-03-06 DIAGNOSIS — I2510 Atherosclerotic heart disease of native coronary artery without angina pectoris: Secondary | ICD-10-CM | POA: Insufficient documentation

## 2018-03-06 DIAGNOSIS — E1122 Type 2 diabetes mellitus with diabetic chronic kidney disease: Secondary | ICD-10-CM | POA: Diagnosis not present

## 2018-03-06 DIAGNOSIS — Z7902 Long term (current) use of antithrombotics/antiplatelets: Secondary | ICD-10-CM | POA: Diagnosis not present

## 2018-03-06 DIAGNOSIS — L989 Disorder of the skin and subcutaneous tissue, unspecified: Secondary | ICD-10-CM | POA: Diagnosis not present

## 2018-03-06 DIAGNOSIS — J45909 Unspecified asthma, uncomplicated: Secondary | ICD-10-CM | POA: Insufficient documentation

## 2018-03-06 MED ORDER — LIDOCAINE-EPINEPHRINE (PF) 2 %-1:200000 IJ SOLN
10.0000 mL | Freq: Once | INTRAMUSCULAR | Status: AC
  Administered 2018-03-07: 10 mL
  Filled 2018-03-06: qty 20

## 2018-03-06 NOTE — Telephone Encounter (Signed)
I sent in Rx of topical clindamycin from the list of alternative agents from [facility] that day last week. If THAT still needs prior auth, please route to [facility] who I am told does the majority of PAs.

## 2018-03-06 NOTE — ED Triage Notes (Signed)
Pt presents with abscess under R axilla for weeks; pt denies any drainage.

## 2018-03-07 MED ORDER — DOXYCYCLINE HYCLATE 100 MG PO CAPS: 100.0000 mg | ORAL_CAPSULE | Freq: Two times a day (BID) | ORAL | 0 refills | Status: DC

## 2018-03-07 NOTE — ED Provider Notes (Signed)
[facility] EMERGENCY DEPARTMENT Provider Note   CSN: [id number] Arrival date & time: 03/06/18  2044     History   Chief Complaint Chief Complaint  Patient presents with  . Abscess    HPI Jane [name] is a 42 y.o. female with history of end-stage renal disease on dialysis, diabetes, is here for evaluation of "boil" to the right side of her thorax below her right breast for one month. States initially it was very sore, malodorous and had scant amount of clear/yellow drainage. Over the last 2-3 weeks area has become more firm and less sore. Pain is mild, intermittent. Was seen by PCP for this and prescribed a topical antibiotic ointment but unable to afford. No other interventions. Aggravating factors include a direct palpation. No alleviating factors. No associated fevers, chills, swelling to the area, redness, warmth. Has had boils to axilla and groin in the past that needed to be lanced. HPI  Past Medical History:  Diagnosis Date  . Asthma    prn inhaler  . CAD S/P BMS PCI to prox LAD 03/31/2016   Ost LAD to Mid LAD lesion, 90% stenosed. Post intervention - Vision BMS 3.0 mm x 18 mm (~3.5 mm) there is a 0% residual stenosis.   . Charcot foot due to diabetes mellitus (HCC) 11/2016   left  . Chronic kidney disease    Stage 4  . Depression   . Diabetic neuropathy (HCC)    feet  . H/O non-ST elevation myocardial infarction (NSTEMI) 03/2016   Found in 9% mid LAD lesion treated with bare-metal stent (BMS) PCI - vision BMS 3.0 mm x 18 mm  . History of MRSA infection   . Hyperlipidemia   . Hypertension    medication dose increased 11/22/2015; has been taking med. consistently since 03/2016, per pt.  . Insulin dependent diabetes mellitus (HCC)   . Iron deficiency anemia    takes iron supplement  . Retinopathy of both eyes   . Sickle cell trait (HCC)   . 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    Patient Active Problem List   Diagnosis Date Noted  .  'light-for-dates' infant with signs of fetal malnutrition 02/12/2018  . Hidradenitis suppurativa 02/12/2018  . Right arm pain 11/29/2017  . MDD (major depressive disorder) 08/11/2017  . Sinus tachycardia 02/23/2017  . CKD stage 5 due to type 2 diabetes mellitus (HCC) 01/05/2017  . Tight heelcords, acquired, left 11/24/2016  . Vitreous floater, left 11/16/2016  . CAD S/P BMS PCI to prox LAD 03/31/2016  . Presence of bare metal stent in LAD coronary artery 03/31/2016  . Atypical angina (HCC) 03/29/2016  . Dysfunctional uterine bleeding 03/17/2016  . PAOD (peripheral arterial occlusive disease) (HCC) 01/08/2016  . Charcot foot due to diabetes mellitus (HCC) 09/07/2015  . Peripheral neuropathy 09/20/2014  . Essential hypertension 08/16/2014  . Seasonal allergic rhinitis 08/16/2014  . Iron deficiency anemia 10/25/2013  . Healthcare maintenance 10/25/2013  . Moderate obesity 09/26/2012  . Hyperlipidemia associated with type 2 diabetes mellitus (HCC) 02/26/2009  . 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, with long-term current use of insulin (HCC) 05/08/2007  . Asthma 05/08/2007    Past Surgical History:  Procedure Laterality Date  . ACHILLES TENDON LENGTHENING Left 11/24/2016   Procedure: Left Achilles tendon lengthening (open);  Surgeon: John Hewitt, MD;  Location: [facility];  Service: Orthopedics;  Laterality: Left;  . AV FISTULA PLACEMENT Right 07/31/2017   Procedure: ARTERIOVENOUS BRACHIOCEPHALIC FISTULA   CREATION RIGHT ARM;  Surgeon: Chen, Brian L, MD;  Location: [facility] OR;  Service: Vascular;  Laterality: Right;  . CALCANEAL OSTEOTOMY Left 11/24/2016   Procedure: Left hindfoot osteotomy and fusion;  Surgeon: John Hewitt, MD;  Location: [facility];  Service: Orthopedics;  Laterality: Left;  . CARDIAC CATHETERIZATION N/A 03/31/2016   Procedure: Left Heart Cath and Coronary Angiography;  Surgeon: Jonathan J Berry, MD;  Location: [facility]  INVASIVE CV LAB: 90% early mLAD, normal LV Fxn  . CARDIAC CATHETERIZATION N/A 03/31/2016   Procedure: Coronary Stent Intervention;  Surgeon: Jonathan J Berry, MD;  Location: [facility] INVASIVE CV LAB;  Service: Cardiovascular: PCI to mLAD BMS Vision 3.0 mm x 18 mm  . CESAREAN SECTION  1997  . CORONARY ANGIOPLASTY    . FISTULA SUPERFICIALIZATION Right 11/15/2017   Procedure: FISTULA SUPERFICIALIZATION RIGHT BRACHIOCEPHALIC  RIGHT;  Surgeon: Chen, Brian L, MD;  Location: [facility] OR;  Service: Vascular;  Laterality: Right;  . TRANSTHORACIC ECHOCARDIOGRAM  03/2016   Normal LV size and thickness. EF 60-65%. GR 1 DD. Otherwise essentially normal.  . TUBAL LIGATION  1997     OB History    Gravida  2   Para  2   Term  1   Preterm  1   AB  0   Living  2     SAB  0   TAB  0   Ectopic  0   Multiple  0   Live Births               Home Medications    Prior to Admission medications   Medication Sig Start Date End Date Taking? Authorizing Provider  ACCU-CHEK FASTCLIX LANCETS MISC Check blood sugar 3x a day, diag code E11.3493, insulin dependent 06/27/17   Svalina, Gorica, MD  acetaminophen (TYLENOL) 500 MG tablet Take 1,000 mg by mouth every 6 (six) hours as needed (for pain.).    [provider]  atorvastatin (LIPITOR) 40 MG tablet Take 1 tablet (40 mg total) by mouth daily. 01/19/18   Svalina, Gorica, MD  Blood Glucose Monitoring Suppl (ACCU-CHEK GUIDE) w/Device KIT 1 each by Does not apply route 3 (three) times daily. 05/18/17   Svalina, Gorica, MD  carvedilol (COREG) 25 MG tablet Take 1 tablet (25 mg total) by mouth 2 (two) times daily with a meal. 12/06/17   Svalina, Gorica, MD  clindamycin (CLEOCIN T) 1 % lotion Apply topically 2 (two) times daily. 02/26/18   Molt, Bethany, DO  clopidogrel (PLAVIX) 75 MG tablet Take 1 tablet (75 mg total) by mouth daily. 12/06/17   Svalina, Gorica, MD  diltiazem (CARDIZEM CD) 240 MG 24 hr capsule Take 1 capsule (240 mg total) by mouth daily. 06/26/17    Svalina, Gorica, MD  doxycycline (VIBRAMYCIN) 100 MG capsule Take 1 capsule (100 mg total) by mouth 2 (two) times daily. 03/07/18   Gibbons, Claudia J, PA-C  ferrous sulfate 325 (65 FE) MG tablet TAKE 1 Tablet BY MOUTH EVERY MORNING WITH BREAKFAST Patient taking differently: TAKE 325 MG BY MOUTH EVERY MORNING WITH BREAKFAST 10/04/16   Svalina, Gorica, MD  furosemide (LASIX) 80 MG tablet Take 1 tablet (80 mg total) by mouth daily. 12/06/17   Svalina, Gorica, MD  glucose blood (ACCU-CHEK GUIDE) test strip Check blood sugar 4 times a day. Dx code 11.3. 02/03/18   Svalina, Gorica, MD  insulin aspart (NOVOLOG) 100 UNIT/ML injection Inject 6 Units into the skin 3 (three) times daily with meals. 08/01/17     Harbrecht, Lawrence, MD  insulin glargine (LANTUS) 100 UNIT/ML injection Inject 0.12 mLs (12 Units total) into the skin at bedtime. 08/01/17   Harbrecht, Lawrence, MD  Insulin Syringe-Needle U-100 31G X 15/64" 0.3 ML MISC Use to inject insulin three times a day 08/01/17   Harbrecht, Lawrence, MD  loratadine (CLARITIN) 10 MG tablet Take 1 tablet (10 mg total) by mouth daily. 12/06/17   Svalina, Gorica, MD  megestrol (MEGACE) 40 MG tablet Take 1 tablet (40 mg total) by mouth daily. Internal Medicine Program 08/29/17   Harraway-Smith, Carolyn, MD  PROVENTIL HFA 108 (90 Base) MCG/ACT inhaler INHALE 2 PUFFS BY MOUTH EVERY 4 HOURS AS NEEDED FOR COUGHING, WHEEZING, OR SHORTNESS OF BREATH 03/24/17   Svalina, Gorica, MD  sertraline (ZOLOFT) 100 MG tablet Take 1 tablet (100 mg total) by mouth daily. Patient taking differently: Take 100 mg by mouth at bedtime.  09/15/17   Svalina, Gorica, MD  traMADol (ULTRAM) 50 MG tablet Take 1 tablet (50 mg total) by mouth every 6 (six) hours as needed. Patient not taking: Reported on 12/15/2017 12/01/17   Chen, Brian L, MD    Family History Family History  Problem Relation Age of Onset  . Stroke Mother   . Diabetes Mother   . Hypertension Mother   . Aneurysm Mother   . Diabetes  Father   . Hypertension Father   . Diabetes Sister   . Hypertension Sister   . Diabetes Maternal Grandmother   . Diabetes Maternal Grandfather   . Cancer Paternal Grandfather        Prostate    Social History Social History   Tobacco Use  . Smoking status: Former Smoker    Packs/day: 0.00    Years: 0.00    Pack years: 0.00    Last attempt to quit: 08/29/2015    Years since quitting: 2.5  . Smokeless tobacco: Never Used  Substance Use Topics  . Alcohol use: No    Alcohol/week: 0.0 oz  . Drug use: No     Allergies   Adhesive [tape]   Review of Systems Review of Systems  Skin:       Boil   All other systems reviewed and are negative.    Physical Exam Updated Vital Signs BP 121/74 (BP Location: Left Arm)   Pulse 88   Temp 99.5 F (37.5 C) (Oral)   Resp 16   Ht 5' 5" (1.651 m)   Wt 106.1 kg (233 lb 14.5 oz)   SpO2 100%   BMI 38.92 kg/m   Physical Exam  Constitutional: She is oriented to person, place, and time. She appears well-developed and well-nourished.  Non-toxic appearance.  HENT:  Head: Normocephalic.  Right Ear: External ear normal.  Left Ear: External ear normal.  Nose: Nose normal.  Eyes: Conjunctivae and EOM are normal.  Neck: Full passive range of motion without pain.  Cardiovascular: Normal rate.  Pulmonary/Chest: Effort normal. No tachypnea. No respiratory distress.  Musculoskeletal: Normal range of motion.  Neurological: She is alert and oriented to person, place, and time.  Skin: Skin is warm and dry. Capillary refill takes less than 2 seconds.  1 x 2 cm oval raised, indurated, mildly tender erythematous nodular growth to right lateral thorax with central yellow/white center. No surrounding erythema, warmth, fluctuance.   Psychiatric: Her behavior is normal. Thought content normal.     ED Treatments / Results  Labs (all labs ordered are listed, but only abnormal results are displayed) Labs Reviewed - No   data to  display  EKG None  Radiology No results found.  Procedures ..Incision and Drainage Date/Time: 03/07/2018 12:56 AM Performed by: Gibbons, Claudia J, PA-C Authorized by: Gibbons, Claudia J, PA-C   Consent:    Consent obtained:  Verbal   Consent given by:  Patient   Risks discussed:  Pain, incomplete drainage and infection   Alternatives discussed:  Referral Location:    Indications for incision and drainage: nodular, indurated, growth with white center.   Size:  2 x 1 cm   Location:  Trunk   Trunk location:  Chest (right thorax) Pre-procedure details:    Procedure prep: alcohol wipe. Anesthesia (see MAR for exact dosages):    Anesthesia method:  Local infiltration   Local anesthetic:  Lidocaine 2% WITH epi Procedure type:    Complexity:  Simple Procedure details:    Incision types:  Single straight   Scalpel blade:  11   Wound management:  Probed and deloculated   Drainage:  Serous   Drainage amount:  Scant   Wound treatment:  Wound left open   Packing materials:  None Post-procedure details:    Patient tolerance of procedure:  Tolerated well, no immediate complications   (including critical care time)  Medications Ordered in ED Medications  lidocaine-EPINEPHrine (XYLOCAINE W/EPI) 2 %-1:200000 (PF) injection 10 mL (10 mLs Infiltration Given by Other 03/07/18 0020)     Initial Impression / Assessment and Plan / ED Course  I have reviewed the triage vital signs and the nursing notes.  Pertinent labs & imaging results that were available during my care of the patient were reviewed by me and considered in my medical decision making (see chart for details).     42-year-old with mildly tender, nodular, indurated growth to right thorax with white ulcerated center 1 month. No evidence of expanding cellulitis. No fevers, chills to resuscitation for systemic infection.Exam was not convincing of an abscess however incision and drainage was attempted given central white  opening, scant amount of serous-type draining obtained but no purulence. We'll discharge with antibiotic, warm compresses, antibiotic ointment as needed. Encouraged to follow up with dermatology for further evaluation of this, possible biopsy. Patient verbalized understanding and agreeable with plan.Discussed return precautions.  Final Clinical Impressions(s) / ED Diagnoses   Final diagnoses:  Skin growth    ED Discharge Orders        Ordered    doxycycline (VIBRAMYCIN) 100 MG capsule  2 times daily     03/07/18 0043       Gibbons, Claudia J, PA-C 03/07/18 0059    Horton, Courtney F, MD 03/07/18 0226  

## 2018-03-07 NOTE — Discharge Instructions (Addendum)
It is unclear what is the cause of the growth on your side.  It does not look like an abscess or boil.  There was no drainage obtained today during procedure.  Take antibiotic as prescribed to prevent infection.  You can apply a thin layer of antibiotic ointment once daily until it scabs over and stops draining. You need to follow up with dermatology for further evaluation of growth and possible biopsy if recommended. Return for increase in swelling, pain, warmth, redness, pus, fevers.

## 2018-03-09 MED FILL — DOXYCYCLINE HYCLATE 100 MG: 100 | 10 days supply | Qty: 20 | Fill #0

## 2018-03-12 ENCOUNTER — Ambulatory Visit (INDEPENDENT_AMBULATORY_CARE_PROVIDER_SITE_OTHER): Payer: Medicare Other | Admitting: Obstetrics and Gynecology

## 2018-03-12 ENCOUNTER — Encounter: Payer: Self-pay | Admitting: Obstetrics and Gynecology

## 2018-03-12 VITALS — BP 150/82 | HR 95 | Ht 65.0 in | Wt 237.2 lb

## 2018-03-12 DIAGNOSIS — N951 Menopausal and female climacteric states: Secondary | ICD-10-CM | POA: Diagnosis present

## 2018-03-12 MED ORDER — BLACK COHOSH 40 MG PO CAPS: 1.0000 | ORAL_CAPSULE | Freq: Two times a day (BID) | ORAL | 2 refills | Status: DC

## 2018-03-12 NOTE — Progress Notes (Signed)
GYNECOLOGY OFFICE VISIT NOTE  History:  42 y.o. [name] here today for complaints of hot flashes. Patient states that symtpoms hav ebeen present for about 8 months. She endorses 5-10 minutes of feeling extremily hot and sweating. Some symtpom relief with standing in front of open refridgeator. Is known to be perimenopausal. On Megace. She denies any abnormal vaginal discharge, bleeding, pelvic pain or other concerns.   Past Medical History:  Diagnosis Date  . Asthma    prn inhaler  . CAD S/P BMS PCI to prox LAD 03/31/2016   Ost LAD to Mid LAD lesion, 90% stenosed. Post intervention - Vision BMS 3.0 mm x 18 mm (~3.5 mm) there is a 0% residual stenosis.   . Charcot foot due to diabetes mellitus ([place]) 11/2016   left  . Chronic kidney disease    Stage 4  . Depression   . Diabetic neuropathy (HCC)    feet  . H/O non-ST elevation myocardial infarction (NSTEMI) 03/2016   Found in 9% mid LAD lesion treated with bare-metal stent (BMS) PCI - vision BMS 3.0 mm x 18 mm  . History of MRSA infection   . Hyperlipidemia   . Hypertension    medication dose increased 11/22/2015; has been taking med. consistently since 03/2016, per pt.  . Insulin dependent diabetes mellitus ([place])   . Iron deficiency anemia    takes iron supplement  . Retinopathy of both eyes   . Sickle cell trait ([place])   . 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 ACHILLES TENDON LENGTHENING Left 11/24/2016   Procedure: Left Achilles tendon lengthening (open);  Surgeon: Wylene Simmer, MD;  Location: [place];  Service: Orthopedics;  Laterality: Left;  . AV FISTULA PLACEMENT Right 07/31/2017   Procedure: ARTERIOVENOUS BRACHIOCEPHALIC FISTULA CREATION RIGHT ARM;  Surgeon: Conrad Mammoth, MD;  Location: [place];  Service: Vascular;  Laterality: Right;  . CALCANEAL OSTEOTOMY Left 11/24/2016   Procedure: Left hindfoot osteotomy and fusion;  Surgeon: Wylene Simmer, MD;   Location: [place];  Service: Orthopedics;  Laterality: Left;  . CARDIAC CATHETERIZATION N/A 03/31/2016   Procedure: Left Heart Cath and Coronary Angiography;  Surgeon: Lorretta Harp, MD;  Location: [facility] INVASIVE CV LAB: 90% early mLAD, normal LV Fxn  . CARDIAC CATHETERIZATION N/A 03/31/2016   Procedure: Coronary Stent Intervention;  Surgeon: Lorretta Harp, MD;  Location: [place] CV LAB;  Service: Cardiovascular: PCI to mLAD BMS Vision 3.0 mm x 18 mm  . CESAREAN SECTION  1997  . CORONARY ANGIOPLASTY    . FISTULA SUPERFICIALIZATION Right 11/15/2017   Procedure: FISTULA SUPERFICIALIZATION RIGHT BRACHIOCEPHALIC  RIGHT;  Surgeon: Conrad Weyauwega, MD;  Location: [place];  Service: Vascular;  Laterality: Right;  . TRANSTHORACIC ECHOCARDIOGRAM  03/2016   Normal LV size and thickness. EF 60-65%. GR 1 DD. Otherwise essentially normal.  . TUBAL LIGATION  1997    The following portions of the patient's history were reviewed and updated as appropriate: current medications, past medical history and problem list.   Review of Systems:  Pertinent items noted in HPI.   Objective:  Physical Exam BP (!) 150/82 (BP Location: Left Arm, Patient Position: Sitting, Cuff Size: Large)   Pulse 95   Ht 5\' 5"  (1.651 m)   Wt 237 lb 3.2 oz (107.6 kg)   LMP  (LMP Unknown)   BMI 39.47 kg/m  CONSTITUTIONAL: Well-developed, well-nourished female in no acute distress.  SKIN: Skin is warm and dry.  NEUROLOGIC: Alert and oriented. No cranial nerve deficit noted. PSYCHIATRIC: Normal mood and affect. Normal behavior. Normal judgment and thought content. CARDIOVASCULAR: Normal heart rate noted RESPIRATORY: Effort and breath sounds normal, no problems with respiration noted ABDOMEN: Soft, no distention noted.   MUSCULOSKELETAL: Normal range of motion. No edema noted. Orthopedic boot on left foot.   Labs and Imaging No results found.  Assessment & Plan:  1. Hot flushes, perimenopausal Symptoms  consistent with perimenopausal hot flashes. No red flags. Counseled on common symptoms of perimenopause. Due to patient extensive medical history would avoid hormonal supplementation. Will try black cohosh for symptom relief. Follow-up if symptoms not improved on medication   Meds ordered this encounter  Medications  . Black Cohosh 40 MG CAPS    Sig: Take 1 capsule (40 mg total) by mouth 2 (two) times daily.    Dispense:  60 capsule    Refill:  2    Please refer to After Visit Summary for other counseling recommendations.   Luiz Blare, DO OB Fellow Center for [facility], [facility]

## 2018-03-12 NOTE — Progress Notes (Signed)
Pt reports that she has been feeling poorly for about 8 months.  She realized 3 weeks ago that these bad feelings were hot flashes.

## 2018-03-12 NOTE — Patient Instructions (Signed)
Menopause Menopause is the normal time of life when menstrual periods stop completely. Menopause is complete when you have missed 12 consecutive menstrual periods. It usually occurs between the ages of 48 years and 55 years. Very rarely does a woman develop menopause before the age of 40 years. At menopause, your ovaries stop producing the female hormones estrogen and progesterone. This can cause undesirable symptoms and also affect your health. Sometimes the symptoms may occur 4-5 years before the menopause begins. There is no relationship between menopause and:  Oral contraceptives.  Number of children you had.  Race.  The age your menstrual periods started (menarche).  Heavy smokers and very thin women may develop menopause earlier in life. What are the causes?  The ovaries stop producing the female hormones estrogen and progesterone. Other causes include:  Surgery to remove both ovaries.  The ovaries stop functioning for no known reason.  Tumors of the pituitary gland in the brain.  Medical disease that affects the ovaries and hormone production.  Radiation treatment to the abdomen or pelvis.  Chemotherapy that affects the ovaries.  What are the signs or symptoms?  Hot flashes.  Night sweats.  Decrease in sex drive.  Vaginal dryness and thinning of the vagina causing painful intercourse.  Dryness of the skin and developing wrinkles.  Headaches.  Tiredness.  Irritability.  Memory problems.  Weight gain.  Bladder infections.  Hair growth of the face and chest.  Infertility. More serious symptoms include:  Loss of bone (osteoporosis) causing breaks (fractures).  Depression.  Hardening and narrowing of the arteries (atherosclerosis) causing heart attacks and strokes.  How is this diagnosed?  When the menstrual periods have stopped for 12 straight months.  Physical exam.  Hormone studies of the blood. How is this treated? There are many treatment  choices and nearly as many questions about them. The decisions to treat or not to treat menopausal changes is an individual choice made with your health care provider. Your health care provider can discuss the treatments with you. Together, you can decide which treatment will work best for you. Your treatment choices may include:  Hormone therapy (estrogen and progesterone).  Non-hormonal medicines.  Treating the individual symptoms with medicine (for example antidepressants for depression).  Herbal medicines that may help specific symptoms.  Counseling by a psychiatrist or psychologist.  Group therapy.  Lifestyle changes including: ? Eating healthy. ? Regular exercise. ? Limiting caffeine and alcohol. ? Stress management and meditation.  No treatment.  Follow these instructions at home:  Take the medicine your health care provider gives you as directed.  Get plenty of sleep and rest.  Exercise regularly.  Eat a diet that contains calcium (good for the bones) and soy products (acts like estrogen hormone).  Avoid alcoholic beverages.  Do not smoke.  If you have hot flashes, dress in layers.  Take supplements, calcium, and vitamin D to strengthen bones.  You can use over-the-counter lubricants or moisturizers for vaginal dryness.  Group therapy is sometimes very helpful.  Acupuncture may be helpful in some cases. Contact a health care provider if:  You are not sure you are in menopause.  You are having menopausal symptoms and need advice and treatment.  You are still having menstrual periods after age 55 years.  You have pain with intercourse.  Menopause is complete (no menstrual period for 12 months) and you develop vaginal bleeding.  You need a referral to a specialist (gynecologist, psychiatrist, or psychologist) for treatment. Get help right   away if:  You have severe depression.  You have excessive vaginal bleeding.  You fell and think you have a  broken bone.  You have pain when you urinate.  You develop leg or chest pain.  You have a fast pounding heart beat (palpitations).  You have severe headaches.  You develop vision problems.  You feel a lump in your breast.  You have abdominal pain or severe indigestion. This information is not intended to replace advice given to you by your health care provider. Make sure you discuss any questions you have with your health care provider. Document Released: 01/21/2004 Document Revised: 04/07/2016 Document Reviewed: 05/30/2013 Elsevier Interactive Patient Education  2017 [employer]vier Inc.  

## 2018-03-14 NOTE — Telephone Encounter (Signed)
Jane [name] has this been completed yet, as note states it was sent to you by Dr. Danford Bad.  Medications Prior Authorization are for you and [name].

## 2018-03-20 ENCOUNTER — Other Ambulatory Visit: Payer: Self-pay | Admitting: Internal Medicine

## 2018-03-20 DIAGNOSIS — F322 Major depressive disorder, single episode, severe without psychotic features: Secondary | ICD-10-CM

## 2018-03-20 MED FILL — MEGESTROL 40 MG TABLET: 40 | 30 days supply | Qty: 30 | Fill #6

## 2018-03-20 MED FILL — CARTIA XT 120 MG CAPSULE SA: 120 | 30 days supply | Qty: 30 | Fill #0

## 2018-03-20 MED FILL — LIDOCAINE-PRILOCAINE CREAM: 2.5-2.5 | 30 days supply | Qty: 30 | Fill #0

## 2018-03-20 MED FILL — CLOPIDOGREL 75 MG TABLET: 75 | 30 days supply | Qty: 30 | Fill #3

## 2018-03-31 DIAGNOSIS — E8779 Other fluid overload: Secondary | ICD-10-CM | POA: Insufficient documentation

## 2018-04-04 DIAGNOSIS — M7652 Patellar tendinitis, left knee: Secondary | ICD-10-CM | POA: Insufficient documentation

## 2018-04-19 ENCOUNTER — Encounter: Payer: Self-pay | Admitting: Internal Medicine

## 2018-04-19 ENCOUNTER — Ambulatory Visit: Payer: Self-pay

## 2018-04-19 ENCOUNTER — Ambulatory Visit (INDEPENDENT_AMBULATORY_CARE_PROVIDER_SITE_OTHER): Payer: Medicaid Other | Admitting: Internal Medicine

## 2018-04-19 ENCOUNTER — Telehealth: Payer: Self-pay | Admitting: *Deleted

## 2018-04-19 ENCOUNTER — Other Ambulatory Visit: Payer: Self-pay

## 2018-04-19 VITALS — BP 77/33 | HR 104 | Temp 98.4°F | Ht 65.0 in | Wt 237.6 lb

## 2018-04-19 DIAGNOSIS — J302 Other seasonal allergic rhinitis: Secondary | ICD-10-CM | POA: Diagnosis not present

## 2018-04-19 DIAGNOSIS — M940 Chondrocostal junction syndrome [Tietze]: Secondary | ICD-10-CM

## 2018-04-19 DIAGNOSIS — N186 End stage renal disease: Secondary | ICD-10-CM

## 2018-04-19 DIAGNOSIS — Z992 Dependence on renal dialysis: Secondary | ICD-10-CM | POA: Diagnosis not present

## 2018-04-19 DIAGNOSIS — H6121 Impacted cerumen, right ear: Secondary | ICD-10-CM

## 2018-04-19 DIAGNOSIS — R05 Cough: Secondary | ICD-10-CM

## 2018-04-19 DIAGNOSIS — J301 Allergic rhinitis due to pollen: Secondary | ICD-10-CM

## 2018-04-19 DIAGNOSIS — I953 Hypotension of hemodialysis: Secondary | ICD-10-CM | POA: Diagnosis not present

## 2018-04-19 DIAGNOSIS — R059 Cough, unspecified: Secondary | ICD-10-CM

## 2018-04-19 MED ORDER — FLUTICASONE FUROATE 27.5 MCG/SPRAY NA SUSP: 2.0000 | Freq: Every day | NASAL | 12 refills | Status: DC

## 2018-04-19 MED ORDER — DICLOFENAC SODIUM 1 % TD GEL: 2.0000 g | Freq: Four times a day (QID) | TRANSDERMAL | 0 refills | Status: DC | PRN

## 2018-04-19 MED ORDER — CETIRIZINE HCL 10 MG PO TABS: 10.0000 mg | ORAL_TABLET | Freq: Every day | ORAL | 3 refills | Status: DC

## 2018-04-19 NOTE — Telephone Encounter (Signed)
Pt calls and states she is at dialysis now, she spoke to PA and was advised to call pcp for appt, pt is coughing, congested, had coughed some bloodtinged, thick mucous up but now it is clear phlegm she is having some chest discomfort from all the coughing, denies fevers, shortness of breath, chest pain, h/a, N&V. ACC full, appt at 1345 dr Trilby Drummer.

## 2018-04-19 NOTE — Progress Notes (Signed)
Internal Medicine Clinic Attending  Case discussed with Dr. Trilby Drummer  at the time of the visit.  We reviewed the resident's history and exam and pertinent patient test results.  I agree with the assessment, diagnosis, and plan of care documented in the resident's note.  Significant hypotension post-dialysis, but alert and able to interact during visit. She rested in our clinic until she felt able to travel home by public transportation (she did not drive).  Will treat allergic rhinitis with transition of loratidine to cetirizine and start fluticasone nasal spray.   Oda Kilts, MD

## 2018-04-19 NOTE — Patient Instructions (Signed)
Thank you for allowing [place] to care for you  For your cough and congestion - We have stopped your Claritin and started Zyrtec (Cetirizine) 10mg  Daily - We have also started Fluticasone (Nasal spray) 2 sniffs daily  Please follow up as need if symptoms fail to improve in the next 2 weeks  Fluticasone nasal spray What is this medicine? FLUTICASONE (floo TIK a sone) is a corticosteroid. This medicine is used to treat the symptoms of allergies like sneezing, itchy red eyes, and itchy, runny, or stuffy nose. This medicine is also used to treat nasal polyps. This medicine may be used for other purposes; ask your health care provider or pharmacist if you have questions. COMMON BRAND NAME(S): Flonase, Flonase Allergy Relief, Flonase Sensimist, Veramyst, XHANCE What should I tell my health care provider before I take this medicine? They need to know if you have any of these conditions: -cataracts -glaucoma -infection, like tuberculosis, herpes, or fungal infection -recent surgery on nose or sinuses -taking a corticosteroid by mouth -an unusual or allergic reaction to fluticasone, steroids, other medicines, foods, dyes, or preservatives -pregnant or trying to get pregnant -breast-feeding How should I use this medicine? This medicine is for use in the nose. Follow the directions on your product or prescription label. This medicine works best if used at regular intervals. Do not use more often than directed. Make sure that you are using your nasal spray correctly. After 6 months of daily use for allergies, talk to your doctor or health care professional before using it for a longer time. Ask your doctor or health care professional if you have any questions. Talk to your pediatrician regarding the use of this medicine in children. Special care may be needed. Some products have been used for allergies in children as young as 2 years. After 2 months of daily use without a prescription in a child, talk to  your pediatrician before using it for a longer time. Use of this medicine for nasal polyps is not approved in children. Overdosage: If you think you have taken too much of this medicine contact a poison control center or emergency room at once. NOTE: This medicine is only for you. Do not share this medicine with others. What if I miss a dose? If you miss a dose, use it as soon as you remember. If it is almost time for your next dose, use only that dose and continue with your regular schedule. Do not use double or extra doses. What may interact with this medicine? -certain antibiotics like clarithromycin and telithromycin -certain medicines for fungal infections like ketoconazole, itraconazole, and voriconazole -conivaptan -nefazodone -some medicines for HIV -vaccines This list may not describe all possible interactions. Give your health care provider a list of all the medicines, herbs, non-prescription drugs, or dietary supplements you use. Also tell them if you smoke, drink alcohol, or use illegal drugs. Some items may interact with your medicine. What should I watch for while using this medicine? Visit your doctor or health care professional for regular checks on your progress. Some symptoms may improve within 12 hours after starting use. Check with your doctor or health care professional if there is no improvement in your symptoms after 3 weeks of use. This medicine may increase your risk of getting an infection. Tell your doctor or health care professional if you are around anyone with measles or chickenpox, or if you develop sores or blisters that do not heal properly. What side effects may I notice from receiving  this medicine? Side effects that you should report to your doctor or health care professional as soon as possible: -allergic reactions like skin rash, itching or hives, swelling of the face, lips, or tongue -changes in vision -crusting or sores in the nose -nosebleed -signs and  symptoms of infection like fever or chills; cough; sore throat -white patches or sores in the mouth or nose Side effects that usually do not require medical attention (report to your doctor or health care professional if they continue or are bothersome): -burning or irritation inside the nose or throat -cough -headache -unusual taste or smell This list may not describe all possible side effects. Call your doctor for medical advice about side effects. You may report side effects to FDA at 1-800-FDA-1088. Where should I keep my medicine? Keep out of the reach of children. Store at room temperature between 15 and 30 degrees C (59 and 86 degrees F). Avoid exposure to extreme heat, cold, or light. Throw away any unused medicine after the expiration date. NOTE: This sheet is a summary. It may not cover all possible information. If you have questions about this medicine, talk to your doctor, pharmacist, or health care provider.  2018 Elsevier/Gold Standard (2016-08-12 14:23:12)

## 2018-04-19 NOTE — Assessment & Plan Note (Addendum)
Patient reports cough, congestion, chest discomfort, rhinorrhea, and watery itchy eyes. Patient states she has had these symptoms on and of since the beginning of the spring. This episode began on Monday. Her cough has been intermittently productive; initially of green sputum with some red specks and more recently clear sputum. She has some associated chest pain with her cough that is reproducible with palpation and had one episode of vomiting due to severe coughing fit yesterday. She denies fevers or chills. She has had seasonal allergies for years and is currently only taking Claritin.  On physical exam patient has rhinorrhea with inflation of L nasal sinuses, watery eyes. Findings overall consistent with her history of Allergies. Cough likely due to UACS. Patient has costochodritis as a result of her cough. Will avoid systemic NSAIDs in settign of ESRD on HD. - Discontinue Claritin - Start Cetirizine 10mg  Daily - Start Fluticasone 2 sprays Daily - Work note for this afternoon's visit - Follow up if no improvement in symptoms in the next 2 weeks' - Voltaren gel for costochondritis

## 2018-04-19 NOTE — Progress Notes (Signed)
   CC: Allergies, Cough  HPI:   Jane [name] is a 42 y.o. F with PMHx listed below presenting for Allergies, Cough. Please see the A&P for the status of the patient's chronic medical problems.  Patient reports cough, congestion, chest discomfort, rhinorrhea, and watery itchy eyes. Patient states she has had these symptoms on and of since the beginning of the spring. This episode began on Monday. Her cough has been intermittently productive; initially of green sputum with some red specks and more recently clear sputum. She has some associated chest pain with her cough that is reproducible with palpation and had one episode of vomiting due to severe coughing fit yesterday. She denies fevers or chills. She has had seasonal allergies for years and is currently only taking Claritin.  Patient noted to be hypotensive. This is in the setting of coming directly from dialysis. She states this typically happens following dialysis as they are still adjusting her dry weight. Patient monitored and felt better throughout office visit. Is taking public transportation home.   Past Medical History:  Diagnosis Date  . Asthma    prn inhaler  . CAD S/P BMS PCI to prox LAD 03/31/2016   Ost LAD to Mid LAD lesion, 90% stenosed. Post intervention - Vision BMS 3.0 mm x 18 mm (~3.5 mm) there is a 0% residual stenosis.   . Charcot foot due to diabetes mellitus ([place]) 11/2016   left  . Chronic kidney disease    Stage 4  . Depression   . Diabetic neuropathy (HCC)    feet  . H/O non-ST elevation myocardial infarction (NSTEMI) 03/2016   Found in 9% mid LAD lesion treated with bare-metal stent (BMS) PCI - vision BMS 3.0 mm x 18 mm  . History of MRSA infection   . Hyperlipidemia   . Hypertension    medication dose increased 11/22/2015; has been taking med. consistently since 03/2016, per pt.  . Insulin dependent diabetes mellitus ([place])   . Iron deficiency anemia    takes iron supplement  . Retinopathy of both  eyes   . Sickle cell trait ([place])   . 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  Physical Exam:  Vitals:   04/19/18 1331  BP: (!) 77/33  Pulse: (!) 104  Temp: 98.4 F (36.9 C)  TempSrc: Oral  SpO2: 100%  Weight: 237 lb 9.6 oz (107.8 kg)  Height: 5\' 5"  (1.651 m)   Physical Exam  Constitutional: She appears well-developed and well-nourished.  Tired appearing (post dialyses)  HENT:  Head: Normocephalic and atraumatic.  Left Ear: External ear normal.  Mouth/Throat: No oropharyngeal exudate.  Right ear with cerumen obstructing view of tympanic membrane Bilateral rhinorrhea Right nasal sinus clear and moist Left nasal sinus inflamed  Eyes:  Watery eyes  Cardiovascular: Normal rate, regular rhythm, normal heart sounds and intact distal pulses.  Pulmonary/Chest: Effort normal and breath sounds normal. No respiratory distress.  Abdominal: Soft. Bowel sounds are normal. She exhibits no distension. There is no tenderness.  Obese abdomen   Assessment & Plan:   See Encounters Tab for problem based charting.  Patient discussed with Dr. Rebeca Alert

## 2018-05-11 MED FILL — LANTUS 100 UNITS/ML VIAL: 100 | 28 days supply | Qty: 10 | Fill #2

## 2018-05-11 MED FILL — CLOPIDOGREL 75 MG TABLET: 75 | 30 days supply | Qty: 30 | Fill #4

## 2018-05-11 MED FILL — CETIRIZINE HCL 10 MG TABS: 10 | 30 days supply | Qty: 30 | Fill #0

## 2018-05-11 MED FILL — VOLTAREN 1% GEL: 1 | 13 days supply | Qty: 100 | Fill #0

## 2018-05-11 MED FILL — SERTRALINE HCL 100 MG TAB: 100 | 30 days supply | Qty: 30 | Fill #0

## 2018-05-11 MED FILL — MEGESTROL 40 MG TABLET: 40 | 30 days supply | Qty: 30 | Fill #7

## 2018-05-23 DIAGNOSIS — E11610 Type 2 diabetes mellitus with diabetic neuropathic arthropathy: Secondary | ICD-10-CM | POA: Insufficient documentation

## 2018-05-23 HISTORY — DX: Type 2 diabetes mellitus with diabetic neuropathic arthropathy: E11.610

## 2018-05-26 DIAGNOSIS — L0390 Cellulitis, unspecified: Secondary | ICD-10-CM | POA: Insufficient documentation

## 2018-05-28 ENCOUNTER — Encounter (HOSPITAL_COMMUNITY): Payer: Self-pay | Admitting: Emergency Medicine

## 2018-05-28 ENCOUNTER — Emergency Department (HOSPITAL_COMMUNITY): Payer: Medicare Other

## 2018-05-28 ENCOUNTER — Other Ambulatory Visit: Payer: Self-pay

## 2018-05-28 ENCOUNTER — Emergency Department (HOSPITAL_COMMUNITY)
Admission: EM | Admit: 2018-05-28 | Discharge: 2018-05-29 | Disposition: A | Discharge status: Home / Self Care | Payer: Medicare Other | Attending: Emergency Medicine | Admitting: Emergency Medicine

## 2018-05-28 DIAGNOSIS — R601 Generalized edema: Secondary | ICD-10-CM | POA: Diagnosis not present

## 2018-05-28 DIAGNOSIS — Z79899 Other long term (current) drug therapy: Secondary | ICD-10-CM | POA: Diagnosis not present

## 2018-05-28 DIAGNOSIS — J45909 Unspecified asthma, uncomplicated: Secondary | ICD-10-CM | POA: Insufficient documentation

## 2018-05-28 DIAGNOSIS — R590 Localized enlarged lymph nodes: Secondary | ICD-10-CM | POA: Insufficient documentation

## 2018-05-28 DIAGNOSIS — R2241 Localized swelling, mass and lump, right lower limb: Secondary | ICD-10-CM | POA: Diagnosis present

## 2018-05-28 DIAGNOSIS — N185 Chronic kidney disease, stage 5: Secondary | ICD-10-CM | POA: Insufficient documentation

## 2018-05-28 DIAGNOSIS — Z87891 Personal history of nicotine dependence: Secondary | ICD-10-CM | POA: Diagnosis not present

## 2018-05-28 DIAGNOSIS — Z794 Long term (current) use of insulin: Secondary | ICD-10-CM | POA: Diagnosis not present

## 2018-05-28 DIAGNOSIS — M14671 Charcot's joint, right ankle and foot: Secondary | ICD-10-CM | POA: Diagnosis not present

## 2018-05-28 DIAGNOSIS — Z992 Dependence on renal dialysis: Secondary | ICD-10-CM | POA: Insufficient documentation

## 2018-05-28 DIAGNOSIS — E1122 Type 2 diabetes mellitus with diabetic chronic kidney disease: Secondary | ICD-10-CM | POA: Diagnosis not present

## 2018-05-28 DIAGNOSIS — Z7901 Long term (current) use of anticoagulants: Secondary | ICD-10-CM | POA: Diagnosis not present

## 2018-05-28 DIAGNOSIS — I120 Hypertensive chronic kidney disease with stage 5 chronic kidney disease or end stage renal disease: Secondary | ICD-10-CM | POA: Insufficient documentation

## 2018-05-28 DIAGNOSIS — N186 End stage renal disease: Secondary | ICD-10-CM

## 2018-05-28 DIAGNOSIS — I2510 Atherosclerotic heart disease of native coronary artery without angina pectoris: Secondary | ICD-10-CM | POA: Diagnosis not present

## 2018-05-28 LAB — CBC WITH DIFFERENTIAL/PLATELET
ABS IMMATURE GRANULOCYTES: 0.1 10*3/uL (ref 0.0–0.1)
BASOS ABS: 0.1 10*3/uL (ref 0.0–0.1)
Basophils Relative: 1 %
EOS PCT: 5 %
Eosinophils Absolute: 0.4 10*3/uL (ref 0.0–0.7)
HCT: 34.4 % — ABNORMAL LOW (ref 36.0–46.0)
HEMOGLOBIN: 9.9 g/dL — AB (ref 12.0–15.0)
Immature Granulocytes: 1 %
LYMPHS PCT: 26 %
Lymphs Abs: 2.1 10*3/uL (ref 0.7–4.0)
MCH: 27 pg (ref 26.0–34.0)
MCHC: 28.8 g/dL — AB (ref 30.0–36.0)
MCV: 94 fL (ref 78.0–100.0)
Monocytes Absolute: 0.6 10*3/uL (ref 0.1–1.0)
Monocytes Relative: 7 %
NEUTROS ABS: 5 10*3/uL (ref 1.7–7.7)
Neutrophils Relative %: 60 %
Platelets: 385 10*3/uL (ref 150–400)
RBC: 3.66 MIL/uL — AB (ref 3.87–5.11)
RDW: 16 % — ABNORMAL HIGH (ref 11.5–15.5)
WBC: 8.2 10*3/uL (ref 4.0–10.5)

## 2018-05-28 LAB — COMPREHENSIVE METABOLIC PANEL
ALT: 13 U/L (ref 0–44)
AST: 19 U/L (ref 15–41)
Albumin: 3 g/dL — ABNORMAL LOW (ref 3.5–5.0)
Alkaline Phosphatase: 90 U/L (ref 38–126)
Anion gap: 12 (ref 5–15)
BUN: 42 mg/dL — AB (ref 6–20)
CALCIUM: 9.1 mg/dL (ref 8.9–10.3)
CO2: 27 mmol/L (ref 22–32)
CREATININE: 11.17 mg/dL — AB (ref 0.44–1.00)
Chloride: 100 mmol/L (ref 98–111)
GFR calc non Af Amer: 4 mL/min — ABNORMAL LOW (ref >60)
GFR, EST AFRICAN AMERICAN: 4 mL/min — AB (ref >60)
Glucose, Bld: 197 mg/dL — ABNORMAL HIGH (ref 70–99)
Potassium: 3.9 mmol/L (ref 3.5–5.1)
SODIUM: 139 mmol/L (ref 135–145)
Total Bilirubin: 0.4 mg/dL (ref 0.3–1.2)
Total Protein: 6.8 g/dL (ref 6.5–8.1)

## 2018-05-28 LAB — BRAIN NATRIURETIC PEPTIDE: B Natriuretic Peptide: 75.8 pg/mL (ref 0.0–100.0)

## 2018-05-28 NOTE — ED Triage Notes (Signed)
Patient with bilateral leg swelling since dialysis a few weeks ago.  She states that she is having a lot of pain in right ankle.  She states that she also did injure it from a fall a few days ago.  She does dialysis [facility].

## 2018-05-29 ENCOUNTER — Emergency Department (HOSPITAL_BASED_OUTPATIENT_CLINIC_OR_DEPARTMENT_OTHER): Payer: Medicare Other

## 2018-05-29 DIAGNOSIS — M7989 Other specified soft tissue disorders: Secondary | ICD-10-CM

## 2018-05-29 DIAGNOSIS — R590 Localized enlarged lymph nodes: Secondary | ICD-10-CM | POA: Diagnosis not present

## 2018-05-29 MED ORDER — DOXYCYCLINE HYCLATE 100 MG PO CAPS: 100.0000 mg | ORAL_CAPSULE | Freq: Two times a day (BID) | ORAL | 0 refills | Status: DC

## 2018-05-29 MED ORDER — DOXYCYCLINE HYCLATE 100 MG PO TABS
100.0000 mg | ORAL_TABLET | Freq: Once | ORAL | Status: AC
  Administered 2018-05-29: 100 mg via ORAL
  Filled 2018-05-29: qty 1

## 2018-05-29 NOTE — ED Provider Notes (Signed)
[facility] EMERGENCY DEPARTMENT Provider Note   CSN: [id number] Arrival date & time: 05/28/18  1916     History   Chief Complaint Chief Complaint  Patient presents with  . Leg Swelling    HPI Jane [name] is a 42 y.o. female.  HPI  This 42 year old female with a history of asthma, coronary artery disease, Charcot foot, chronic kidney disease on dialysis Tuesday, Thursday, and Saturday, hypertension, hyperlipidemia who presents with right lower extremity swelling.  Patient reports over the last 2 weeks she has noted increasing right calf and right ankle swelling.  She denies any injury although she did fall several days ago but states that her swelling and pain started before that.  She has not noted any pain or swelling in the left lower extremity.  No history of blood clots.  She denies any chest pain or shortness of breath.  She reports last dialysis session was on Saturday.  Currently she rates her pain at 4 out of 10.  She does have some worsening pain with ambulation.  Past Medical History:  Diagnosis Date  . Asthma    prn inhaler  . CAD S/P BMS PCI to prox LAD 03/31/2016   Ost LAD to Mid LAD lesion, 90% stenosed. Post intervention - Vision BMS 3.0 mm x 18 mm (~3.5 mm) there is a 0% residual stenosis.   . Charcot foot due to diabetes mellitus ([place]) 11/2016   left  . Chronic kidney disease    Stage 4  . Depression   . Diabetic neuropathy (HCC)    feet  . H/O non-ST elevation myocardial infarction (NSTEMI) 03/2016   Found in 9% mid LAD lesion treated with bare-metal stent (BMS) PCI - vision BMS 3.0 mm x 18 mm  . History of MRSA infection   . Hyperlipidemia   . Hypertension    medication dose increased 11/22/2015; has been taking med. consistently since 03/2016, per pt.  . Insulin dependent diabetes mellitus ([place])   . Iron deficiency anemia    takes iron supplement  . Retinopathy of both eyes   . Sickle cell trait ([place])   . Tight heelcords,  acquired, left 11/2016    Patient Active Problem List   Diagnosis Date Noted  . 'light-for-dates' infant with signs of fetal malnutrition 02/12/2018  . Hidradenitis suppurativa 02/12/2018  . Right arm pain 11/29/2017  . MDD (major depressive disorder) 08/11/2017  . Sinus tachycardia 02/23/2017  . CKD stage 5 due to type 2 diabetes mellitus ([place]) 01/05/2017  . Tight heelcords, acquired, left 11/24/2016  . Vitreous floater, left 11/16/2016  . CAD S/P BMS PCI to prox LAD 03/31/2016  . Presence of bare metal stent in LAD coronary artery 03/31/2016  . Atypical angina ([place]) 03/29/2016  . Dysfunctional uterine bleeding 03/17/2016  . PAOD (peripheral arterial occlusive disease) ([place]) 01/08/2016  . Charcot foot due to diabetes mellitus ([place]) 09/07/2015  . Peripheral neuropathy 09/20/2014  . Essential hypertension 08/16/2014  . Seasonal allergic rhinitis 08/16/2014  . Iron deficiency anemia 10/25/2013  . Healthcare maintenance 10/25/2013  . Moderate obesity 09/26/2012  . Hyperlipidemia associated with type 2 diabetes mellitus ([place]) 02/26/2009  . 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, with long-term current use of insulin ([place]) 05/08/2007  . Asthma 05/08/2007    Past Surgical History:  Procedure Laterality Date  . ACHILLES TENDON LENGTHENING Left 11/24/2016   Procedure: Left Achilles tendon lengthening (open);  Surgeon: Wylene Simmer, MD;  Location: [facility];  Service: Orthopedics;  Laterality: Left;  . AV FISTULA PLACEMENT Right 07/31/2017   Procedure: ARTERIOVENOUS BRACHIOCEPHALIC FISTULA CREATION RIGHT ARM;  Surgeon: Conrad [place], MD;  Location: [facility];  Service: Vascular;  Laterality: Right;  . CALCANEAL OSTEOTOMY Left 11/24/2016   Procedure: Left hindfoot osteotomy and fusion;  Surgeon: Wylene Simmer, MD;  Location: [facility];  Service: Orthopedics;  Laterality: Left;  . CARDIAC CATHETERIZATION N/A 03/31/2016   Procedure:  Left Heart Cath and Coronary Angiography;  Surgeon: Lorretta Harp, MD;  Location: [facility] INVASIVE CV LAB: 90% early mLAD, normal LV Fxn  . CARDIAC CATHETERIZATION N/A 03/31/2016   Procedure: Coronary Stent Intervention;  Surgeon: Lorretta Harp, MD;  Location: [place] CV LAB;  Service: Cardiovascular: PCI to mLAD BMS Vision 3.0 mm x 18 mm  . CESAREAN SECTION  1997  . CORONARY ANGIOPLASTY    . FISTULA SUPERFICIALIZATION Right 11/15/2017   Procedure: FISTULA SUPERFICIALIZATION RIGHT BRACHIOCEPHALIC  RIGHT;  Surgeon: Conrad [place], MD;  Location: [facility];  Service: Vascular;  Laterality: Right;  . TRANSTHORACIC ECHOCARDIOGRAM  03/2016   Normal LV size and thickness. EF 60-65%. GR 1 DD. Otherwise essentially normal.  . TUBAL LIGATION  1997     OB History    Gravida  2   Para  2   Term  1   Preterm  1   AB  0   Living  2     SAB  0   TAB  0   Ectopic  0   Multiple  0   Live Births               Home Medications    Prior to Admission medications   Medication Sig Start Date End Date Taking? Authorizing Provider  acetaminophen (TYLENOL) 500 MG tablet Take 1,000 mg by mouth every 6 (six) hours as needed (for pain.).   Yes [provider]  cetirizine (ZYRTEC) 10 MG tablet Take 1 tablet (10 mg total) by mouth daily. 04/19/18  Yes Neva Seat, MD  clopidogrel (PLAVIX) 75 MG tablet Take 1 tablet (75 mg total) by mouth daily. 12/06/17  Yes Alphonzo Grieve, MD  diltiazem (CARDIZEM CD) 120 MG 24 hr capsule Take 120 mg by mouth daily.   Yes [provider]  fluticasone (VERAMYST) 27.5 MCG/SPRAY nasal spray Place 2 sprays into the nose daily. 04/19/18  Yes Neva Seat, MD  insulin aspart (NOVOLOG) 100 UNIT/ML injection Inject 6 Units into the skin 3 (three) times daily with meals. 08/01/17  Yes Kathi Ludwig, MD  insulin glargine (LANTUS) 100 UNIT/ML injection Inject 0.12 mLs (12 Units total) into the skin at bedtime. Patient taking differently: Inject  28 Units into the skin at bedtime.  08/01/17  Yes Kathi Ludwig, MD  megestrol (MEGACE) 40 MG tablet Take 1 tablet (40 mg total) by mouth daily. Internal Medicine Program 08/29/17  Yes Lavonia Drafts, MD  multivitamin (RENA-VIT) TABS tablet Take 1 tablet by mouth daily. 04/30/18  Yes [provider]  PROVENTIL HFA 108 (90 Base) MCG/ACT inhaler INHALE 2 PUFFS BY MOUTH EVERY 4 HOURS AS NEEDED FOR COUGHING, WHEEZING, OR SHORTNESS OF BREATH 03/24/17  Yes Svalina, Estill Dooms, MD  RENAGEL 800 MG tablet Take 1,600-3,200 mg by mouth See admin instructions. Take 4 tablets three times daily with meals and take 2 tablets with snacks 05/07/18  Yes [provider]  sertraline (ZOLOFT) 100 MG tablet Take 1 tablet (100 mg total) by mouth at bedtime. 03/20/18  Yes Alphonzo Grieve, MD  ACCU-CHEK FASTCLIX LANCETS MISC Check blood sugar 3x a day, diag code [phone], insulin dependent 06/27/17   Alphonzo Grieve, MD  atorvastatin (LIPITOR) 40 MG tablet Take 1 tablet (40 mg total) by mouth daily. Patient not taking: Reported on 03/12/2018 01/19/18   Alphonzo Grieve, MD  Black Cohosh 40 MG CAPS Take 1 capsule (40 mg total) by mouth 2 (two) times daily. Patient not taking: Reported on 05/29/2018 03/12/18   Katheren Shams, DO  Blood Glucose Monitoring Suppl (ACCU-CHEK GUIDE) w/Device KIT 1 each by Does not apply route 3 (three) times daily. 05/18/17   Alphonzo Grieve, MD  carvedilol (COREG) 25 MG tablet Take 1 tablet (25 mg total) by mouth 2 (two) times daily with a meal. Patient not taking: Reported on 03/12/2018 12/06/17   Alphonzo Grieve, MD  clindamycin (CLEOCIN T) 1 % lotion Apply topically 2 (two) times daily. Patient not taking: Reported on 05/29/2018 02/26/18   Molt, Bethany, DO  diclofenac sodium (VOLTAREN) 1 % GEL Apply 2 g topically 4 (four) times daily as needed (Chest Pain). Patient not taking: Reported on 05/29/2018 04/19/18   Neva Seat, MD  diltiazem (CARDIZEM CD) 240 MG 24 hr capsule Take 1  capsule (240 mg total) by mouth daily. Patient not taking: Reported on 05/29/2018 06/26/17   Alphonzo Grieve, MD  doxycycline (VIBRAMYCIN) 100 MG capsule Take 1 capsule (100 mg total) by mouth 2 (two) times daily. Patient not taking: Reported on 05/29/2018 03/07/18   Kinnie Feil, PA-C  ferrous sulfate 325 (65 FE) MG tablet TAKE 1 Tablet BY MOUTH EVERY MORNING WITH BREAKFAST Patient not taking: Reported on 03/12/2018 10/04/16   Alphonzo Grieve, MD  furosemide (LASIX) 80 MG tablet Take 1 tablet (80 mg total) by mouth daily. Patient not taking: Reported on 03/12/2018 12/06/17   Alphonzo Grieve, MD  glucose blood (ACCU-CHEK GUIDE) test strip Check blood sugar 4 times a day. Dx code 11.3. 02/03/18   Alphonzo Grieve, MD  Insulin Syringe-Needle U-100 31G X 15/64" 0.3 ML MISC Use to inject insulin three times a day 08/01/17   Kathi Ludwig, MD  traMADol (ULTRAM) 50 MG tablet Take 1 tablet (50 mg total) by mouth every 6 (six) hours as needed. Patient not taking: Reported on 05/29/2018 12/01/17   Conrad Van Vleck, MD    Family History Family History  Problem Relation Age of Onset  . Stroke Mother   . Diabetes Mother   . Hypertension Mother   . Aneurysm Mother   . Diabetes Father   . Hypertension Father   . Diabetes Sister   . Hypertension Sister   . Diabetes Maternal Grandmother   . Diabetes Maternal Grandfather   . Cancer Paternal Grandfather        Prostate    Social History Social History   Tobacco Use  . Smoking status: Former Smoker    Packs/day: 0.00    Years: 0.00    Pack years: 0.00    Last attempt to quit: 08/29/2015    Years since quitting: 2.7  . Smokeless tobacco: Never Used  Substance Use Topics  . Alcohol use: No    Alcohol/week: 0.0 oz  . Drug use: No     Allergies   Adhesive [tape]   Review of Systems Review of Systems  Constitutional: Negative for fever.  Respiratory: Negative for shortness of breath.   Cardiovascular: Positive for leg swelling. Negative  for chest pain.  Gastrointestinal: Negative for abdominal pain.  Genitourinary: Negative for  dysuria.  Musculoskeletal:       Right ankle pain  Neurological: Negative for weakness and numbness.  All other systems reviewed and are negative.    Physical Exam Updated Vital Signs BP 127/63   Pulse 83   Temp 98.9 F (37.2 C) (Oral)   Resp 16   SpO2 99%   Physical Exam  Constitutional: She is oriented to person, place, and time. She appears well-developed and well-nourished.  Obese, no acute distress  HENT:  Head: Normocephalic and atraumatic.  Eyes: Pupils are equal, round, and reactive to light.  Cardiovascular: Normal rate, regular rhythm and normal heart sounds.  Fistula right upper extremity, positive thrill  Pulmonary/Chest: Effort normal and breath sounds normal. No respiratory distress. She has no wheezes.  Abdominal: Soft. Bowel sounds are normal. There is no tenderness.  Musculoskeletal:  Right greater than left lower extremity edema extending from the mid calf through the ankle and the dorsum of the foot, 2+ DP pulse, tenderness palpation behind the calf, normal range of motion of the ankle  Neurological: She is alert and oriented to person, place, and time.  Skin: Skin is warm and dry.  Psychiatric: She has a normal mood and affect.  Nursing note and vitals reviewed.    ED Treatments / Results  Labs (all labs ordered are listed, but only abnormal results are displayed) Labs Reviewed  CBC WITH DIFFERENTIAL/PLATELET - Abnormal; Notable for the following components:      Result Value   RBC 3.66 (*)    Hemoglobin 9.9 (*)    HCT 34.4 (*)    MCHC 28.8 (*)    RDW 16.0 (*)    All other components within normal limits  COMPREHENSIVE METABOLIC PANEL - Abnormal; Notable for the following components:   Glucose, Bld 197 (*)    BUN 42 (*)    Creatinine, Ser 11.17 (*)    Albumin 3.0 (*)    GFR calc non Af Amer 4 (*)    GFR calc Af Amer 4 (*)    All other components  within normal limits  BRAIN NATRIURETIC PEPTIDE    EKG None  Radiology Dg Ankle Complete Right  Result Date: 05/28/2018 CLINICAL DATA:  Pt c/o medial and posterior right ankle pain and swelling x 2 weeks. No injury to the area. Pt believes she may have a stress fracture because she walks on heard floors and has had charcot foot. EXAM: RIGHT ANKLE - COMPLETE 3+ VIEW COMPARISON:  None. FINDINGS: No malleolar fracture identified. Borderline appearance for lateral widening of the mortise. Plantar and Achilles calcaneal spurs. Charcot arthropathy of the midfoot with bony fragmentation and collapse. Indistinct bony fragments dorsal to the talonavicular joint. Possible fragmentation of the anterior cuboid. Soft tissue swelling overlying the midfoot. Probable tibiotalar joint effusion. IMPRESSION: 1. Charcot midfoot with bony collapse and fragmentation. Extensive soft tissue swelling. 2. No appreciable fracture of the talus or malleoli. Regional soft tissue swelling diffusely along the ankle. 3. Borderline lateral widening of the mortise. Electronically Signed   By: Van Clines M.D.   On: 05/28/2018 21:16    Procedures Procedures (including critical care time)  Medications Ordered in ED Medications - No data to display   Initial Impression / Assessment and Plan / ED Course  I have reviewed the triage vital signs and the nursing notes.  Pertinent labs & imaging results that were available during my care of the patient were reviewed by me and considered in my medical decision making (see chart  for details).     Patient with right lower extremity swelling over 2 weeks.  Denies injury but did report a fall several days ago.  She has tenderness and swelling to the right calf and ankle.  X-rays of the right lower extremity without acute fracture.  She does have a Charcot foot.  Given calf tenderness, would be concern for DVT.  Will hold for ultrasound in the morning.  Final Clinical  Impressions(s) / ED Diagnoses   Final diagnoses:  None    ED Discharge Orders    None       Horton, Barbette Hair, MD 05/29/18 [phone]

## 2018-05-29 NOTE — ED Provider Notes (Signed)
  Physical Exam  BP 127/63   Pulse 83   Temp 98.9 F (37.2 C) (Oral)   Resp 16   SpO2 99%   Physical Exam  ED Course/Procedures     Procedures  MDM  Patient care assumed at 7am. Patient is a dialysis patient and has R leg pain and swelling. Sign out pending DVT study. DVT study negative, there is enlarged lymph node in the R groin. Xray R ankle showed Charcot joint that is chronic. On my exam, some skin breakdown on the R foot. ? Cellulitis. WBC nl, afebrile. I wonder if she has early cellulitis of the foot or just some inflammation that caused her swollen R inguinal lymph nodes. Has no abdominal symptoms. Will dc home with doxycycline. Told her to call dialysis center today to see if she can get into dialysis today.        Drenda Freeze, MD 05/29/18 [phone]

## 2018-05-29 NOTE — ED Notes (Signed)
Assisted to Bathroom ambulates with assistance.

## 2018-05-29 NOTE — Discharge Instructions (Addendum)
Take doxycycline twice daily for a week. You have a swollen lymph node in your right groin that is likely from mild skin infection in your foot.   Call dialysis center today regarding getting in for dialysis.   See your doctor. Follow up with orthopedic doctor regarding your foot.   Return to ER if you have worse groin swelling, foot pain or swelling, fever, trouble breathing.

## 2018-05-29 NOTE — Progress Notes (Signed)
*  Preliminary Results* Right lower extremity venous duplex completed. Right lower extremity is negative for deep vein thrombosis. There is no evidence of right Baker's cyst.  Incidental finding: there is a heterogenous area of the right groin measuring 3.6cm, suggestive of prominent inguinal lymph.  05/29/2018 8:53 AM  Maudry Mayhew, BS, RVT, RDCS, RDMS

## 2018-06-05 NOTE — Progress Notes (Deleted)
   CC: ***  HPI:  [name] is a 42 y.o. with a PMH of ***  Please see problem based Assessment and Plan for status of patients chronic conditions.  Past Medical History:  Diagnosis Date  . Asthma    prn inhaler  . CAD S/P BMS PCI to prox LAD 03/31/2016   Ost LAD to Mid LAD lesion, 90% stenosed. Post intervention - Vision BMS 3.0 mm x 18 mm (~3.5 mm) there is a 0% residual stenosis.   . Charcot foot due to diabetes mellitus ([place]) 11/2016   left  . Chronic kidney disease    Stage 4  . Depression   . Diabetic neuropathy (HCC)    feet  . H/O non-ST elevation myocardial infarction (NSTEMI) 03/2016   Found in 9% mid LAD lesion treated with bare-metal stent (BMS) PCI - vision BMS 3.0 mm x 18 mm  . History of MRSA infection   . Hyperlipidemia   . Hypertension    medication dose increased 11/22/2015; has been taking med. consistently since 03/2016, per pt.  . Insulin dependent diabetes mellitus ([place])   . Iron deficiency anemia    takes iron supplement  . Retinopathy of both eyes   . Sickle cell trait ([place])   . 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 GENERAL- alert, co-operative, appears as stated age, not in any distress. HEENT- Atraumatic, normocephalic, PERRL, EOMI, oral mucosa appears moist. CARDIAC- RRR, no murmurs, rubs or gallops. RESP- Moving equal volumes of air, and clear to auscultation bilaterally, no wheezes or crackles. ABDOMEN- Soft, nontender, bowel sounds present. NEURO- CN 2-12 grossly intact. EXTREMITIES- pulse 2+, symmetric, no pedal edema. SKIN- Warm, dry, No rash or lesion. PSYCH- Normal mood and affect, appropriate thought content and speech.  Assessment & Plan:   See Encounters Tab for problem based charting.  No problem-specific Assessment & Plan notes found for this encounter.    Patient {GC/GE:[id number]::"discussed with","seen with"} Dr.  {NAMES:[id number]::"Butcher","Granfortuna","E. Hoffman","Klima","Mullen","Narendra","Raines","Vincent"}   Alphonzo Grieve, MD Internal Medicine PGY-3

## 2018-06-06 ENCOUNTER — Encounter: Payer: Self-pay | Admitting: Internal Medicine

## 2018-06-13 ENCOUNTER — Telehealth: Payer: Self-pay | Admitting: Internal Medicine

## 2018-06-13 DIAGNOSIS — E11610 Type 2 diabetes mellitus with diabetic neuropathic arthropathy: Secondary | ICD-10-CM

## 2018-06-13 NOTE — Telephone Encounter (Signed)
Called patient regarding recent ED visit for right foot pain. She states she is still having right foot pain and swelling. XR in emergency room consistent with acute Charcot foot on the right.   Ankle XR 05/28/2018 1. Charcot midfoot with bony collapse and fragmentation. Extensive soft tissue swelling. 2. No appreciable fracture of the talus or malleoli. Regional soft tissue swelling diffusely along the ankle. 3. Borderline lateral widening of the mortise.  Patient asked to call her orthopedic doctor to set up appointment and we will resend referral today as well for urgent appointment. She is in agreement with this.  Alphonzo Grieve, MD IMTS - PGY3

## 2018-07-11 MED FILL — MEGESTROL 40 MG TABLET: 40 | 30 days supply | Qty: 30 | Fill #8

## 2018-08-01 ENCOUNTER — Ambulatory Visit (INDEPENDENT_AMBULATORY_CARE_PROVIDER_SITE_OTHER): Payer: Medicare Other | Admitting: Internal Medicine

## 2018-08-01 ENCOUNTER — Encounter: Payer: Self-pay | Admitting: Internal Medicine

## 2018-08-01 ENCOUNTER — Other Ambulatory Visit: Payer: Self-pay

## 2018-08-01 VITALS — BP 135/79 | HR 104 | Temp 99.2°F | Ht 65.0 in | Wt 231.0 lb

## 2018-08-01 DIAGNOSIS — F329 Major depressive disorder, single episode, unspecified: Secondary | ICD-10-CM | POA: Diagnosis not present

## 2018-08-01 DIAGNOSIS — E11610 Type 2 diabetes mellitus with diabetic neuropathic arthropathy: Secondary | ICD-10-CM | POA: Diagnosis not present

## 2018-08-01 DIAGNOSIS — Z23 Encounter for immunization: Secondary | ICD-10-CM

## 2018-08-01 DIAGNOSIS — E113493 Type 2 diabetes mellitus with severe nonproliferative diabetic retinopathy without macular edema, bilateral: Secondary | ICD-10-CM | POA: Diagnosis not present

## 2018-08-01 DIAGNOSIS — E1169 Type 2 diabetes mellitus with other specified complication: Secondary | ICD-10-CM | POA: Diagnosis not present

## 2018-08-01 DIAGNOSIS — E785 Hyperlipidemia, unspecified: Secondary | ICD-10-CM | POA: Diagnosis not present

## 2018-08-01 DIAGNOSIS — Z794 Long term (current) use of insulin: Secondary | ICD-10-CM

## 2018-08-01 DIAGNOSIS — Z955 Presence of coronary angioplasty implant and graft: Secondary | ICD-10-CM

## 2018-08-01 DIAGNOSIS — I2510 Atherosclerotic heart disease of native coronary artery without angina pectoris: Secondary | ICD-10-CM | POA: Diagnosis not present

## 2018-08-01 DIAGNOSIS — Z992 Dependence on renal dialysis: Secondary | ICD-10-CM

## 2018-08-01 DIAGNOSIS — Z9861 Coronary angioplasty status: Secondary | ICD-10-CM

## 2018-08-01 DIAGNOSIS — I120 Hypertensive chronic kidney disease with stage 5 chronic kidney disease or end stage renal disease: Secondary | ICD-10-CM

## 2018-08-01 DIAGNOSIS — Z978 Presence of other specified devices: Secondary | ICD-10-CM | POA: Diagnosis not present

## 2018-08-01 DIAGNOSIS — E1122 Type 2 diabetes mellitus with diabetic chronic kidney disease: Secondary | ICD-10-CM

## 2018-08-01 DIAGNOSIS — Z79899 Other long term (current) drug therapy: Secondary | ICD-10-CM

## 2018-08-01 DIAGNOSIS — E1151 Type 2 diabetes mellitus with diabetic peripheral angiopathy without gangrene: Secondary | ICD-10-CM

## 2018-08-01 DIAGNOSIS — I10 Essential (primary) hypertension: Secondary | ICD-10-CM

## 2018-08-01 DIAGNOSIS — Z7902 Long term (current) use of antithrombotics/antiplatelets: Secondary | ICD-10-CM

## 2018-08-01 DIAGNOSIS — N186 End stage renal disease: Secondary | ICD-10-CM | POA: Diagnosis not present

## 2018-08-01 DIAGNOSIS — Z87891 Personal history of nicotine dependence: Secondary | ICD-10-CM

## 2018-08-01 LAB — POCT GLYCOSYLATED HEMOGLOBIN (HGB A1C): Hemoglobin A1C: 9.1 % — AB (ref 4.0–5.6)

## 2018-08-01 LAB — GLUCOSE, CAPILLARY: Glucose-Capillary: 238 mg/dL — ABNORMAL HIGH (ref 70–99)

## 2018-08-01 MED ORDER — ATORVASTATIN CALCIUM 40 MG PO TABS: 40.0000 mg | ORAL_TABLET | Freq: Every day | ORAL | 3 refills | Status: DC

## 2018-08-01 NOTE — Patient Instructions (Addendum)
It was nice seeing you today, Ms. Scardina!  Call your eye doctors office in the next couple of weeks to set up your yearly eye exam.   Call Dr. Allison Quarry office to make a follow up appointment.  Restart taking atorvastatin; let me know if it's not covered.  For the 10 days, try to check your sugar 3-4 times a day so we can better adjust your insulin.

## 2018-08-01 NOTE — Progress Notes (Signed)
CC: diabetes  HPI:  [name] is a 42 y.o. with a PMH of T2DM, HTN, CAD s/p BMS to LAD, PAD, MDD, ESRD on HD presenting to clinic for diabetes.  Since last seen by me, patient has transitioned to hemodialysis, which she states she's tolerating well most of time time. She will at times have nausea and vomiting when her BP drops during HD. Her Dry weight for right now is 102.5kgs which she states she usually meets; she denies signing off of HD early. She still produces some urine but has seen a progressive decrease since starting HD; she denies hematuria, dysuria.  T2DM: Patient on 28 Lantus qhs and 6 novolog TID wc. She has not been checking her CBGs at home; she denies symptoms of hypoglycemic episodes; last hypoglycemic episode was months ago when I last saw her. She is finding it difficult to reconcile DM diet and renal diet but found speaking with our nutritionist today helpful. She denies vision changes; she had ophtho eval in Jan 2019, but hasn't seen the retinal specialist in over a year.   HTN Tachycardia: Patient now only on diltiazem 120mg  daily; coreg was stopped. She states relatively good control of BP and tachycardia since starting HD. She denies chest pain, shortness of breath; LE swelling is well controlled.  CAD s/p BMS to LAD: Patient denies chest pain or shortness of breath. She states she has stopped atorvastatin about 6 months ago due to not being able to afford it at the time; she stopped plavix more than 1 year ago. She has not seen cardiology in over 1 year.  Please see problem based Assessment and Plan for status of patients chronic conditions.  Past Medical History:  Diagnosis Date  . Asthma    prn inhaler  . CAD S/P BMS PCI to prox LAD 03/31/2016   Ost LAD to Mid LAD lesion, 90% stenosed. Post intervention - Vision BMS 3.0 mm x 18 mm (~3.5 mm) there is a 0% residual stenosis.   . Charcot foot due to diabetes mellitus ([place]) 11/2016   left 2018;  right 2019  . Depression   . Diabetic neuropathy (HCC)    feet  . ESRD (end stage renal disease) on dialysis ([place]) 12/2017   ?chronic interstitial nephritis  . H/O non-ST elevation myocardial infarction (NSTEMI) 03/2016   Found in 90% mid LAD lesion treated with bare-metal stent (BMS) PCI - vision BMS 3.0 mm x 18 mm  . History of MRSA infection   . Hyperlipidemia   . Hypertension    medication dose increased 11/22/2015; has been taking med. consistently since 03/2016, per pt.  . Insulin dependent diabetes mellitus ([place])   . Iron deficiency anemia    takes iron supplement  . Retinopathy of both eyes   . Sickle cell trait ([place])   . 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 CARDIAC- RRR, no murmurs, rubs or gallops. RESP- Moving equal volumes of air, and clear to auscultation bilaterally, no wheezes or crackles. ABDOMEN- Soft, nontender, bowel sounds present. EXTREMITIES- pulse 2+, symmetric, minimal LE edema; RLE in cast. SKIN- Warm, dry, no rash or lesion.  PSYCH- Normal mood and affect, appropriate thought content and speech.  Assessment & Plan:   See Encounters Tab for problem based charting.   Patient discussed with Dr. Verdie Drown, MD Internal Medicine PGY-3

## 2018-08-02 ENCOUNTER — Encounter: Payer: Self-pay | Admitting: Internal Medicine

## 2018-08-02 MED ORDER — INSULIN GLARGINE 100 UNIT/ML ~~LOC~~ SOLN: 28.0000 [IU] | Freq: Every day | SUBCUTANEOUS | Status: DC

## 2018-08-02 NOTE — Assessment & Plan Note (Signed)
Well controlled currently only on diltiazem, which will be continued.

## 2018-08-02 NOTE — Assessment & Plan Note (Addendum)
Patient w/o anginal symptoms. She has been off of plavix for some months but had been on for at least a couple of years after her bare metal stent placement. From review of cardio notes, it appears plan was to continue plavix.  Coreg had been stopped at some point after starting HD and she is now only on diltiazem for BP and would provide anti-anginal effects.  Patient also not on atorvastatin due to insurance issues for a few months; as statin in her case would be for 2ndary prevention, I think it would be beneficial to continue despite ESRD on HD.  Plan: --restart atorvastatin  --patient to schedule cards f/u

## 2018-08-02 NOTE — Assessment & Plan Note (Addendum)
Patient now on TThS HD schedule; overall seems to be tolerating transition well. Reports dry weight of 102.5kgs for now. She is euvolemic on exam today and w/o symptoms of fluid overload or uremia.  Plan: --she received diet suggestions from our nutritionist for renal-diabetic diet --Prevnar 13 vaccine and flu vaccines administered today

## 2018-08-02 NOTE — Assessment & Plan Note (Addendum)
Restarting atorvastatin 40mg  daily for 2ndary prevention; may still provide reduced CV mortality and over-all mortality risk benefit despite transition to HD.

## 2018-08-02 NOTE — Assessment & Plan Note (Signed)
Stable; patient reports ortho is hopeful she will not need surgery on the right foot - has been in cast since early Aug 2019. She finds it difficult to not be weight bearing as her left leg is still weaker from nonweight-bearing status for the previous year.  Plan: --continue f/u with [facility] --advised to stick with non-weight bearing as able

## 2018-08-02 NOTE — Assessment & Plan Note (Signed)
Patient with A1c of 9 today; she is not checking her CBGs at all.  Plan: --continue lantus 28 units qhs and novolog 6 units TID wc --asked that she check CBGs 3-4 times a day for the next 10 days and return to clinic for further insulin titration.  --she will call retinal specialist for follow up --not candidate for ACE-I/ARB due to ESRD status --restarting atorvastatin for HLD and 2ndary prevention

## 2018-08-02 NOTE — Progress Notes (Signed)
Medicine attending: Medical history, presenting problems, physical findings, and medications, reviewed with resident physician Dr Gorica Svalina on the day of the patient visit and I concur with her evaluation and management plan. 

## 2018-08-10 ENCOUNTER — Encounter: Payer: Self-pay | Admitting: Internal Medicine

## 2018-08-10 ENCOUNTER — Ambulatory Visit (INDEPENDENT_AMBULATORY_CARE_PROVIDER_SITE_OTHER): Payer: Medicare Other | Admitting: Internal Medicine

## 2018-08-10 ENCOUNTER — Other Ambulatory Visit: Payer: Self-pay

## 2018-08-10 ENCOUNTER — Ambulatory Visit (INDEPENDENT_AMBULATORY_CARE_PROVIDER_SITE_OTHER): Payer: Medicare Other | Admitting: Dietician

## 2018-08-10 ENCOUNTER — Encounter: Payer: Self-pay | Admitting: Dietician

## 2018-08-10 VITALS — BP 138/67 | HR 97 | Temp 98.8°F | Ht 65.0 in | Wt 238.5 lb

## 2018-08-10 DIAGNOSIS — N186 End stage renal disease: Secondary | ICD-10-CM

## 2018-08-10 DIAGNOSIS — I2510 Atherosclerotic heart disease of native coronary artery without angina pectoris: Secondary | ICD-10-CM | POA: Diagnosis not present

## 2018-08-10 DIAGNOSIS — E1122 Type 2 diabetes mellitus with diabetic chronic kidney disease: Secondary | ICD-10-CM | POA: Diagnosis not present

## 2018-08-10 DIAGNOSIS — E113493 Type 2 diabetes mellitus with severe nonproliferative diabetic retinopathy without macular edema, bilateral: Secondary | ICD-10-CM

## 2018-08-10 DIAGNOSIS — Z794 Long term (current) use of insulin: Secondary | ICD-10-CM

## 2018-08-10 DIAGNOSIS — E1151 Type 2 diabetes mellitus with diabetic peripheral angiopathy without gangrene: Secondary | ICD-10-CM

## 2018-08-10 DIAGNOSIS — F329 Major depressive disorder, single episode, unspecified: Secondary | ICD-10-CM

## 2018-08-10 DIAGNOSIS — I120 Hypertensive chronic kidney disease with stage 5 chronic kidney disease or end stage renal disease: Secondary | ICD-10-CM

## 2018-08-10 DIAGNOSIS — Z992 Dependence on renal dialysis: Secondary | ICD-10-CM

## 2018-08-10 DIAGNOSIS — Z87891 Personal history of nicotine dependence: Secondary | ICD-10-CM

## 2018-08-10 DIAGNOSIS — Z9111 Patient's noncompliance with dietary regimen: Secondary | ICD-10-CM

## 2018-08-10 DIAGNOSIS — Z7902 Long term (current) use of antithrombotics/antiplatelets: Secondary | ICD-10-CM

## 2018-08-10 DIAGNOSIS — Z955 Presence of coronary angioplasty implant and graft: Secondary | ICD-10-CM | POA: Diagnosis not present

## 2018-08-10 MED ORDER — GLUCOSE BLOOD VI STRP: ORAL_STRIP | 11 refills | Status: DC

## 2018-08-10 MED ORDER — ACCU-CHEK FASTCLIX LANCETS MISC: 5 refills | Status: DC

## 2018-08-10 NOTE — Progress Notes (Signed)
   CC: diabetes  HPI:  Ms.Zahlia L Wisler is a 42 y.o. with a PMH of T2DM, HTN, CAD s/p BMS to LAD, PAD, MDD, ESRD on HD presenting to clinic for diabetes.  Patient has checked her CBGs since her last visit, with CBG ranging from 110-353, average reading of 203. She states that she has been eating extra snacks or meal at night when taking Lantus as she thought that was supposed to be taken with a meal, and not just Novolog. She states she had been taking Lantus 28 units qhs, and Novolog 6 units TID wc; on her dialysis days she eats mainly only 2 meals. She denies having nausea, vomiting, worsening blurry vision or hypoglycemic episodes.   Please see problem based Assessment and Plan for status of patients chronic conditions.  Past Medical History:  Diagnosis Date  . Asthma    prn inhaler  . CAD S/P BMS PCI to prox LAD 03/31/2016   Ost LAD to Mid LAD lesion, 90% stenosed. Post intervention - Vision BMS 3.0 mm x 18 mm (~3.5 mm) there is a 0% residual stenosis.   . Charcot foot due to diabetes mellitus ([place]) 11/2016   left 2018; right 2019  . Depression   . Diabetic neuropathy (HCC)    feet  . ESRD (end stage renal disease) on dialysis ([place]) 12/2017   ?chronic interstitial nephritis  . H/O non-ST elevation myocardial infarction (NSTEMI) 03/2016   Found in 90% mid LAD lesion treated with bare-metal stent (BMS) PCI - vision BMS 3.0 mm x 18 mm  . History of MRSA infection   . Hyperlipidemia   . Hypertension    medication dose increased 11/22/2015; has been taking med. consistently since 03/2016, per pt.  . Insulin dependent diabetes mellitus ([place])   . Iron deficiency anemia    takes iron supplement  . Retinopathy of both eyes   . Sickle cell trait ([place])   . 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 HEENT- oral mucosa appears moist. CARDIAC- RRR, no murmurs, rubs or gallops. RESP- Moving equal volumes of air, and clear to auscultation bilaterally, no wheezes or crackles. ABDOMEN- Soft, nontender, bowel sounds present. EXTREMITIES- right leg in cast  Assessment & Plan:   See Encounters Tab for problem based charting.   Patient discussed with Dr. Abelardo Diesel, MD Internal Medicine PGY-3

## 2018-08-10 NOTE — Patient Instructions (Signed)
Keep your insulin dosing the same for right now: Lantus 28 units at night (do not need to eat with it) Novolog 6 units three times a day with meals  We'll see you next week for the first download of the continuous glucose monitor.

## 2018-08-10 NOTE — Progress Notes (Signed)
Documentation for Freestyle Libre Pro Continuous glucose monitoring Freestyle Libre Pro CGM sensor placed and started on  who was identified by name and date of birth. Order was received from Dr. Jari Favre  Patient was educated about wearing sensor, keeping food, activity and medication log and when to call office.She was educated about how to care for the sensor and not to have an MRI, CT or Diathermy while wearing the sensorwhat she can learn from the Continuous glucose monitoring. Follow up was arranged with the patient for 1 week to see a doctor and diabetes educator.  Lot #:[id number] Serial #:[id number] Expiration Date:02/12/2019 Jane [name], RD 08/10/2018 11:19 AM.

## 2018-08-10 NOTE — Patient Instructions (Signed)
Please record the time, amount and what food drinks and activities you have while wearing the continuous glucose monitor(CGM).  Bring the folder with you to follow up appointments  Do not have a CT or an MRI while wearing the CGM.   Please make an appointment for 1 week with me and a doctor for the first of two CGM downloads..   You will also return in 2 weeks to have your second download and the CGM remove  Chena Chohan [phone] 

## 2018-08-11 ENCOUNTER — Encounter: Payer: Self-pay | Admitting: Internal Medicine

## 2018-08-11 NOTE — Assessment & Plan Note (Addendum)
Patient with ESRD on HD and poorly controlled DM. She has been eating extra meals and snacks at night when taking Lantus; we discussed that she can take the lantus without food at bedtime and only needs to eat meals when she takes novolog.   Patient tests her CBGs 4 times a day and injects insulin 3-4 times a day. She is currently making adjustments to her insulin regimen based on carb count and CBG readings.  Plan: --CGM placed; will keep lantus 28 units qhs and novolog 6 units TID for now and see what effect not eating extra meals will have on her CGBs. --f/u in 1 wk in [facility] with physician and DME for interpretation and further adjustment of her regimen as necessary.

## 2018-08-13 NOTE — Progress Notes (Signed)
Internal Medicine Clinic Attending  Case discussed with Dr. Svalina  at the time of the visit.  We reviewed the resident's history and exam and pertinent patient test results.  I agree with the assessment, diagnosis, and plan of care documented in the resident's note.  

## 2018-08-14 HISTORY — PX: NM MYOVIEW LTD: HXRAD82

## 2018-08-17 ENCOUNTER — Telehealth: Payer: Self-pay | Admitting: Dietician

## 2018-08-17 ENCOUNTER — Other Ambulatory Visit: Payer: Self-pay

## 2018-08-17 ENCOUNTER — Encounter: Payer: Self-pay | Admitting: Internal Medicine

## 2018-08-17 ENCOUNTER — Encounter: Payer: Self-pay | Admitting: Dietician

## 2018-08-17 ENCOUNTER — Ambulatory Visit (INDEPENDENT_AMBULATORY_CARE_PROVIDER_SITE_OTHER): Payer: Medicare Other | Admitting: Internal Medicine

## 2018-08-17 ENCOUNTER — Ambulatory Visit: Payer: Medicare Other | Admitting: Dietician

## 2018-08-17 VITALS — BP 115/57 | HR 95 | Temp 98.2°F | Ht 65.0 in | Wt 233.6 lb

## 2018-08-17 DIAGNOSIS — Z7982 Long term (current) use of aspirin: Secondary | ICD-10-CM | POA: Diagnosis not present

## 2018-08-17 DIAGNOSIS — E1122 Type 2 diabetes mellitus with diabetic chronic kidney disease: Secondary | ICD-10-CM | POA: Diagnosis not present

## 2018-08-17 DIAGNOSIS — I120 Hypertensive chronic kidney disease with stage 5 chronic kidney disease or end stage renal disease: Secondary | ICD-10-CM

## 2018-08-17 DIAGNOSIS — I2510 Atherosclerotic heart disease of native coronary artery without angina pectoris: Secondary | ICD-10-CM | POA: Diagnosis not present

## 2018-08-17 DIAGNOSIS — E113493 Type 2 diabetes mellitus with severe nonproliferative diabetic retinopathy without macular edema, bilateral: Secondary | ICD-10-CM | POA: Diagnosis not present

## 2018-08-17 DIAGNOSIS — Z87891 Personal history of nicotine dependence: Secondary | ICD-10-CM

## 2018-08-17 DIAGNOSIS — N186 End stage renal disease: Secondary | ICD-10-CM | POA: Diagnosis not present

## 2018-08-17 DIAGNOSIS — E1151 Type 2 diabetes mellitus with diabetic peripheral angiopathy without gangrene: Secondary | ICD-10-CM | POA: Diagnosis not present

## 2018-08-17 DIAGNOSIS — E11610 Type 2 diabetes mellitus with diabetic neuropathic arthropathy: Secondary | ICD-10-CM

## 2018-08-17 DIAGNOSIS — Z794 Long term (current) use of insulin: Principal | ICD-10-CM

## 2018-08-17 DIAGNOSIS — Z992 Dependence on renal dialysis: Secondary | ICD-10-CM

## 2018-08-17 MED ORDER — GLUCOSE BLOOD VI STRP: ORAL_STRIP | 3 refills | Status: DC

## 2018-08-17 MED ORDER — INSULIN ASPART PROT & ASPART (70-30 MIX) 100 UNIT/ML PEN: 6.0000 [IU] | PEN_INJECTOR | Freq: Three times a day (TID) | SUBCUTANEOUS | 0 refills | Status: DC

## 2018-08-17 MED ORDER — INSULIN PEN NEEDLE 32G X 4 MM MISC: 3 refills | Status: DC

## 2018-08-17 MED ORDER — "INSULIN SYRINGE-NEEDLE U-100 31G X 15/64"" 0.3 ML MISC": 3 refills | Status: DC

## 2018-08-17 NOTE — Telephone Encounter (Signed)
Thank you so much, [name]!  Estill Dooms

## 2018-08-17 NOTE — Patient Instructions (Signed)
Ms. Schwan, It was a pleasure meeting you. Keep up the good work with your food log. We should hopefully have good blood sugar data to review with you next week after continuous glucose monitor has been in place for a week. I will send in the new prescription for Novolog flex pen to [facility] outpatient pharmacy.   Please return in 7 days with [place] for Download #1.  Your blood pressure is doing very well!   Take care, and we'll see you next week!

## 2018-08-17 NOTE — Assessment & Plan Note (Signed)
Patient with uncontrolled DM complicated by nonproliferative retinopathy, bilateral charcot feet, and ESRD on HD. Unfortunately, CGM fell off after first day of having it placed last week. Patient had it replaced today in different location. Will continue Lantus 48 units qhs and Novolog 6 units TID with meals. She requested Novolog flex pen, instead of vials so ordered that today.  She has stopped eating night time meal or snack with long-acting insulin since counseling on this last week. Encouraged to continue with food log.  Follow-up in 1 week in [facility] for first CGM download.

## 2018-08-17 NOTE — Progress Notes (Signed)
   CC: DM   HPI:   [name] is a 42 y.o. woman with PMHx T2DM, ESRD on HD, HTN, CAD, PAD who presents for 1 week follow-up after having CGM placed.    Unfortunately, CGM dislodged after first day so we do not have any available data. She is having it replaced at today's visit. Patient is doing well with keeping her food logs. She notes it is difficult to be on diabetic renal diet, but she is doing better with adjustment. Originally thought she had to eat when taking Lantus, but has not done this since discussion at last week's visit.  She has not had any hypoglycemic events. No headaches, vision changes, chest pain, shortness of breath, or lower extremity edema.   Past Medical History:  Diagnosis Date  . Asthma    prn inhaler  . CAD S/P BMS PCI to prox LAD 03/31/2016   Ost LAD to Mid LAD lesion, 90% stenosed. Post intervention - Vision BMS 3.0 mm x 18 mm (~3.5 mm) there is a 0% residual stenosis.   . Charcot foot due to diabetes mellitus ([place]) 11/2016   left 2018; right 2019  . Depression   . Diabetic neuropathy (HCC)    feet  . ESRD (end stage renal disease) on dialysis ([place]) 12/2017   ?chronic interstitial nephritis  . H/O non-ST elevation myocardial infarction (NSTEMI) 03/2016   Found in 90% mid LAD lesion treated with bare-metal stent (BMS) PCI - vision BMS 3.0 mm x 18 mm  . History of MRSA infection   . Hyperlipidemia   . Hypertension    medication dose increased 11/22/2015; has been taking med. consistently since 03/2016, per pt.  . Insulin dependent diabetes mellitus ([place])   . Iron deficiency anemia    takes iron supplement  . Retinopathy of both eyes   . Sickle cell trait ([place])   . 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  Patient seen with Dr. Dareen Piano

## 2018-08-17 NOTE — Telephone Encounter (Signed)
Wants to use CGM, feels she would benefit. The kidney doctor is wanting her blood sugar bettr controlled and so does she. She needs another prescriton for test strips sent so she can check her blood sugar more frequently and with a different ICD 10 code. She understands her testing frequency and times she injects insulin need to be documented in her chart by a physician and she has to get the [place] from mail order.

## 2018-08-17 NOTE — Patient Instructions (Signed)
Please follow up in 1 week and then 2 weeks with [name] and doctor.  Medicare criteria for personal CGM are:  -Have diabetes -Performing 4x/day testing -Insulin treated with 3 or more injections -Insulin treatment regimen requires frequent adjustment by the beneficiary on the basis of BGM or CGM testing results  -In-person visit with treating practitioner to evaluate diabetes control within 6 months prior to ordering CGM -Treating practitioner has an in-person visit with beneficently to assess adherence to the CGM regimen and diabetes treatment plan every 6 months following th initial prescription.  You have to go through mail order to get it right now.

## 2018-08-17 NOTE — Progress Notes (Signed)
[name] presented to the office today for her Continuous glucose monitoring download #1. Her sensor was still on, but had stopped working after 1 day. Her sensor was replaced today.   She is ready to learn how to use the CGM to improve her diabetes self care. She says she and her kidney doctor want her blood sugar to be better controlled. She kept good food records and agreed to continue to do so for the next 2 weeks. She was educated today about Medicare requirements for personal CGM and how to use the information from a personal CGM for self management.   Documentation for Freestyle Libre Pro Continuous glucose monitoring Freestyle Libre Pro CGM sensor placed and started on Jane [name] who was identified by name and date of birth.  Patient was educated about wearing sensor, keeping food, activity and medication log and when to call office.She was educated about how to care for the sensor and not to have an MRI, CT or Diathermy while wearing the sensor. Follow up was arranged with the patient for 1 week and 2 weeks.  Lot #:[id number] A Serial #:[id number] Expiration Date:02/12/2019 Debera Lat, RD 08/17/2018 1:08 PM.

## 2018-08-20 NOTE — Progress Notes (Signed)
Internal Medicine Clinic Attending  I saw and evaluated the patient.  I personally confirmed the key portions of the history and exam documented by Dr. Bloomfield and I reviewed pertinent patient test results.  The assessment, diagnosis, and plan were formulated together and I agree with the documentation in the resident's note.  

## 2018-08-24 ENCOUNTER — Encounter: Payer: Self-pay | Admitting: Cardiology

## 2018-08-24 ENCOUNTER — Ambulatory Visit (INDEPENDENT_AMBULATORY_CARE_PROVIDER_SITE_OTHER): Payer: Medicare Other | Admitting: Internal Medicine

## 2018-08-24 ENCOUNTER — Encounter: Payer: Self-pay | Admitting: Internal Medicine

## 2018-08-24 ENCOUNTER — Telehealth: Payer: Self-pay | Admitting: Dietician

## 2018-08-24 ENCOUNTER — Encounter: Payer: Self-pay | Admitting: Dietician

## 2018-08-24 ENCOUNTER — Ambulatory Visit (INDEPENDENT_AMBULATORY_CARE_PROVIDER_SITE_OTHER): Payer: Medicare Other | Admitting: Dietician

## 2018-08-24 ENCOUNTER — Ambulatory Visit (INDEPENDENT_AMBULATORY_CARE_PROVIDER_SITE_OTHER): Payer: Medicare Other | Admitting: Cardiology

## 2018-08-24 VITALS — BP 108/66 | HR 104 | Ht 65.0 in | Wt 227.0 lb

## 2018-08-24 DIAGNOSIS — Z87891 Personal history of nicotine dependence: Secondary | ICD-10-CM | POA: Diagnosis not present

## 2018-08-24 DIAGNOSIS — Z794 Long term (current) use of insulin: Secondary | ICD-10-CM

## 2018-08-24 DIAGNOSIS — I779 Disorder of arteries and arterioles, unspecified: Secondary | ICD-10-CM

## 2018-08-24 DIAGNOSIS — E1122 Type 2 diabetes mellitus with diabetic chronic kidney disease: Secondary | ICD-10-CM

## 2018-08-24 DIAGNOSIS — I208 Other forms of angina pectoris: Secondary | ICD-10-CM | POA: Diagnosis not present

## 2018-08-24 DIAGNOSIS — E785 Hyperlipidemia, unspecified: Secondary | ICD-10-CM

## 2018-08-24 DIAGNOSIS — Z7982 Long term (current) use of aspirin: Secondary | ICD-10-CM | POA: Diagnosis not present

## 2018-08-24 DIAGNOSIS — Z6838 Body mass index (BMI) 38.0-38.9, adult: Secondary | ICD-10-CM

## 2018-08-24 DIAGNOSIS — Z713 Dietary counseling and surveillance: Secondary | ICD-10-CM

## 2018-08-24 DIAGNOSIS — N186 End stage renal disease: Secondary | ICD-10-CM

## 2018-08-24 DIAGNOSIS — I10 Essential (primary) hypertension: Secondary | ICD-10-CM | POA: Diagnosis not present

## 2018-08-24 DIAGNOSIS — Z992 Dependence on renal dialysis: Secondary | ICD-10-CM

## 2018-08-24 DIAGNOSIS — E113493 Type 2 diabetes mellitus with severe nonproliferative diabetic retinopathy without macular edema, bilateral: Secondary | ICD-10-CM

## 2018-08-24 DIAGNOSIS — I2510 Atherosclerotic heart disease of native coronary artery without angina pectoris: Secondary | ICD-10-CM

## 2018-08-24 DIAGNOSIS — Z9861 Coronary angioplasty status: Secondary | ICD-10-CM

## 2018-08-24 DIAGNOSIS — F322 Major depressive disorder, single episode, severe without psychotic features: Secondary | ICD-10-CM

## 2018-08-24 DIAGNOSIS — E1169 Type 2 diabetes mellitus with other specified complication: Secondary | ICD-10-CM

## 2018-08-24 DIAGNOSIS — Z01810 Encounter for preprocedural cardiovascular examination: Secondary | ICD-10-CM

## 2018-08-24 DIAGNOSIS — R000 Tachycardia, unspecified: Secondary | ICD-10-CM

## 2018-08-24 DIAGNOSIS — F329 Major depressive disorder, single episode, unspecified: Secondary | ICD-10-CM | POA: Diagnosis not present

## 2018-08-24 DIAGNOSIS — I2089 Other forms of angina pectoris: Secondary | ICD-10-CM

## 2018-08-24 DIAGNOSIS — E11610 Type 2 diabetes mellitus with diabetic neuropathic arthropathy: Secondary | ICD-10-CM | POA: Diagnosis not present

## 2018-08-24 LAB — GLUCOSE, CAPILLARY: Glucose-Capillary: 151 mg/dL — ABNORMAL HIGH (ref 70–99)

## 2018-08-24 MED ORDER — INSULIN ASPART PROT & ASPART (70-30 MIX) 100 UNIT/ML PEN: PEN_INJECTOR | SUBCUTANEOUS | 2 refills | Status: DC

## 2018-08-24 MED ORDER — ASPIRIN EC 81 MG PO TBEC: 81.0000 mg | DELAYED_RELEASE_TABLET | Freq: Every day | ORAL | 3 refills | Status: DC

## 2018-08-24 MED ORDER — INSULIN GLARGINE 100 UNIT/ML ~~LOC~~ SOLN: 24.0000 [IU] | Freq: Every day | SUBCUTANEOUS | 2 refills | Status: DC

## 2018-08-24 NOTE — Patient Instructions (Addendum)
Your insulin to carb ratio( how many carb 1 unit of Novo0lgo will cover) =10   Your Correction factor( how many mg/dl 1 units of novolgo can lower your blood sugar) = 40  Your target blood sugar= 150  Before each meal:  Check blood sugar  Count carbs to get food dose of Novolog- then divide the total by 10 to get your food dose  Correction dose of Novolog- subtract your target blood sugar from your actual blood sugar and then divide that by 40    Meal dose= Add the food dose to the correction dose   Insurance rejected because they needed to know date she got her meter: - she reran  And total for strips(450)  And (204) lancets is $14.98  Please [place]  Carb counting by writing the carbs, food dose and correction dose on your food log as many times as you  Can in the next week. ( at least 3 times sound reasonable?)   Please make a follow up in 1 week.   [name] [phone]

## 2018-08-24 NOTE — Progress Notes (Signed)
Medical Nutrition Therapy:  Appt start time: 0900 end time:  1000. Visit # 1  Assessment:  Primary concerns today: glycemic control.  [name] today for her CGM download #1. After reviewing the results with her, she decided she wanted to learn to adjust her insulin based on the carbs she eats and correct her blood sugars when high before meals. She is also interested in obtaining Continuous glucose monitoring but has not been abel to check her blood sugars because she has not been abel to get test strips.  Preferred Learning Style: Auditory,Visual,Hands on Learning Readiness: Ready and Change in progress  ANTHROPOMETRICS: Estimated body mass index is 38.74 kg/m as calculated from the following:   Height as of an earlier encounter on 08/24/18: 5' 5"  (1.651 m).   Weight as of an earlier encounter on 08/24/18: 232 lb 12.8 oz (105.6 kg). Physical activity is limited due to charcot foot both feet and using a scooter.  WEIGHT HISTORY: Wt Readings from Last 5 Encounters:  08/24/18 232 lb 12.8 oz (105.6 kg)  08/17/18 233 lb 9.6 oz (106 kg)  08/10/18 238 lb 8 oz (108.2 kg)  08/01/18 231 lb (104.8 kg)  04/19/18 237 lb 9.6 oz (107.8 kg)    SLEEP:she reports poor sleep MEDICATIONS:  Outpatient Encounter Medications as of 08/24/2018  Medication Sig  . ACCU-CHEK FASTCLIX LANCETS MISC Check blood sugar 4x/day  . acetaminophen (TYLENOL) 500 MG tablet Take 1,000 mg by mouth every 6 (six) hours as needed (for pain.).  Marland Kitchen atorvastatin (LIPITOR) 40 MG tablet Take 1 tablet (40 mg total) by mouth daily.  . Black Cohosh 40 MG CAPS Take 1 capsule (40 mg total) by mouth 2 (two) times daily. (Patient not taking: Reported on 05/29/2018)  . Blood Glucose Monitoring Suppl (ACCU-CHEK GUIDE) w/Device KIT 1 each by Does not apply route 3 (three) times daily.  . cetirizine (ZYRTEC) 10 MG tablet Take 1 tablet (10 mg total) by mouth daily.  . clindamycin (CLEOCIN T) 1 % lotion Apply topically 2 (two)  times daily. (Patient not taking: Reported on 05/29/2018)  . clopidogrel (PLAVIX) 75 MG tablet Take 1 tablet (75 mg total) by mouth daily. (Patient not taking: Reported on 08/01/2018)  . diclofenac sodium (VOLTAREN) 1 % GEL Apply 2 g topically 4 (four) times daily as needed (Chest Pain). (Patient not taking: Reported on 05/29/2018)  . diltiazem (CARDIZEM CD) 120 MG 24 hr capsule Take 120 mg by mouth daily.  . fluticasone (VERAMYST) 27.5 MCG/SPRAY nasal spray Place 2 sprays into the nose daily.  Marland Kitchen glucose blood (ACCU-CHEK GUIDE) test strip Check blood sugar up to 5 times a day  . insulin aspart (NOVOLOG) 100 UNIT/ML injection Inject 6 Units into the skin 3 (three) times daily with meals.  . Insulin Pen Needle 32G X 4 MM MISC Use to inject insulin 3 times a day  . Insulin Syringe-Needle U-100 31G X 15/64" 0.3 ML MISC Use to inject insulin daily  . megestrol (MEGACE) 40 MG tablet Take 1 tablet (40 mg total) by mouth daily. Internal Medicine Program  . multivitamin (RENA-VIT) TABS tablet Take 1 tablet by mouth daily.  Marland Kitchen PROVENTIL HFA 108 (90 Base) MCG/ACT inhaler INHALE 2 PUFFS BY MOUTH EVERY 4 HOURS AS NEEDED FOR COUGHING, WHEEZING, OR SHORTNESS OF BREATH  . RENAGEL 800 MG tablet Take 1,600-3,200 mg by mouth See admin instructions. Take 4 tablets three times daily with meals and take 2 tablets with snacks  . sertraline (ZOLOFT) 100 MG tablet  Take 1 tablet (100 mg total) by mouth at bedtime.  . [DISCONTINUED] insulin aspart protamine - aspart (NOVOLOG MIX 70/30 FLEXPEN) (70-30) 100 UNIT/ML FlexPen Inject 0.06 mLs (6 Units total) into the skin 3 (three) times daily with meals.  . [DISCONTINUED] insulin glargine (LANTUS) 100 UNIT/ML injection Inject 0.28 mLs (28 Units total) into the skin at bedtime.   No facility-administered encounter medications on file as of 08/24/2018.    BLOOD SUGAR:151 today in office, she felt diaphoretic but thinks it was a hot flash. [name] wore the CGM for  8days.  The average reading was 186, % time in target was 50, % time below target was 2, and % time above target was. 48. Intervention will be to begin adjusting insulin before meal based on carbs and blood sugar.  DIETARY INTAKE: Usual eating pattern includes 3 meals and 1-3 snacks per day. Everyday foods include greens, mac and cheese, rice, grits, koolaid.  Avoided foods include milk and dairy.   No Constipation:  No reported hair loss Dining Out (times/week): ?  24-hr recall:  B ( 8 am 3 days a week and noon 4 days a week) waffles, eggs, sausage, syrup, lemonade or fish, french fries or pizza and saod  L (3-4 PM): pizza, fish, fries,  D ( 7-8 PM) fish, french fries, broccoli, hush puppies, mac & Cheese rice beverages- kool aid, lemonade, denies alcohol  Progress Towards Goal(s):  In progress.   Nutritional Diagnosis:  NI-5.8.4 Inconsistent carbohydrate intake As related to conistent mealtime insulin doses and the need for them to be more closely aligned.  As evidenced by fluctuating blood sugars noted on cgm.    Intervention:  Nutrition education on advanced card counting. Coordination of care: discussed her difficulty affording several medications with pharmacy and her plan to better cover carbs and correct her blood sugars with additional insulin before meals. Call to pharmacy to assist her in getting te3st strips  Teaching Method Utilized: Visual,Auditory, Hands on Handouts given during visit include:avs, carb counting bok and handout on advanced carb counting Barriers to learning/adherence to lifestyle change: competing values Demonstrated degree of understanding via:  Teach Back   Monitoring/Evaluation:  Dietary intake, exercise,meter, and body weight in 1 week(s).  Debera Lat, RD 08/24/2018 12:26 PM.

## 2018-08-24 NOTE — Patient Instructions (Signed)
FOLLOW-UP INSTRUCTIONS When: One week For: Your second CGM download What to bring: Your medications  I have decreased your nightly insulin Lantus to 24 units from 28 units. In addition, I have altered the way your prescription for meal time insulin is used from 4-9 units based on your carb counting.   Today we discussed carb counting and insulin use.   Thank you for your visit to the [name] today. If you have any questions or concerns please call us at [phone].

## 2018-08-24 NOTE — Patient Instructions (Signed)
Medication Instructions:    Start taking ASPIRIN 81 MG  ONE TABLET DAILY RESTART TAKING ATORVASTATIN 40 MG ONE TABLET DAILY   If you need a refill on your cardiac medications before your next appointment, please call your pharmacy.   Lab work:-- please do the lab the day of  lexiscan  Lipid Liver function profile   If you have labs (blood work) drawn today and your tests are completely normal, you will receive your results only by: Marland Kitchen MyChart Message (if you have MyChart) OR . A paper copy in the mail If you have any lab test that is abnormal or we need to change your treatment, we will call you to review the results.  Testing/Procedures:   SCHEDULE AT [facility] has requested that you have a lexiscan myoview. For further information please visit [email or web address]. Please follow instruction sheet, as given.    Follow-Up: At [facility], you and your health needs are our priority.  As part of our continuing mission to provide you with exceptional heart care, we have created designated Provider Care Teams.  These Care Teams include your primary Cardiologist (physician) and Advanced Practice Providers (APPs -  Physician Assistants and Nurse Practitioners) who all work together to provide you with the care you need, when you need it. You will need a follow up appointment in 4 months with Glenetta Hew, MD  Any Other Special Instructions Will Be Listed Below (If Applicable).

## 2018-08-24 NOTE — Progress Notes (Signed)
   CC: diabetes  HPI:[name] is a 42 y.o. female who presents for evaluation of her diabetes. Please see individual problem based A/P for details.  Diabetes: [name] wore the CGM for 8 days. The average reading was 186, % time in target was 50, % time below target was 2, and % time above target was. 48. Intervention will be to decrease her lantus to 24U from 28 due to early morning lows and to advise the patient on her need for carb counting and correction scale insulin in addition to her daily meal time insulin. The patient will be scheduled to see [facility] for a final appointment.   Plan: Lantus down to 24U QHS Aspart ~6U TID range from 4-9U based on carb counts. Patient was able to teach back her carb counting technique and how to avoid stacking her insulin.  I feel that she is a possible candidate for a Dexcom glucose monitoring system given her symptoms as she is often unaware that she has hypoglycemia and has tremendous variation in her serum glucose levels despite regular daily evaluation with finger stick testing. We will continue to consider this at her next visit.   PHQ-9: Score of 19 today. After speaking with the patient she states that she does not have the funds to obtain the medication nor is willing currently to attempt further care with a counselor at this time. She believe she can obtain the medication this week but has been off of treatment for 6 days. We will refrain from advancing her treatment currently and will discuss this further at her next visit.    Past Medical History:  Diagnosis Date  . Asthma    prn inhaler  . CAD S/P BMS PCI to prox LAD 03/31/2016   Ost LAD to Mid LAD lesion, 90% stenosed. Post intervention - Vision BMS 3.0 mm x 18 mm (~3.5 mm) there is a 0% residual stenosis.   . Charcot foot due to diabetes mellitus ([place]) 11/2016   left 2018; right 2019  . Depression   . Diabetic neuropathy (HCC)    feet  . ESRD (end stage renal  disease) on dialysis ([place]) 12/2017   ?chronic interstitial nephritis  . H/O non-ST elevation myocardial infarction (NSTEMI) 03/2016   Found in 90% mid LAD lesion treated with bare-metal stent (BMS) PCI - vision BMS 3.0 mm x 18 mm  . History of MRSA infection   . Hyperlipidemia   . Hypertension    medication dose increased 11/22/2015; has been taking med. consistently since 03/2016, per pt.  . Insulin dependent diabetes mellitus ([place])   . Iron deficiency anemia    takes iron supplement  . Retinopathy of both eyes   . Sickle cell trait ([place])   . 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  Physical Exam: Vitals:   08/24/18 1038  BP: 117/62  Pulse: 95  Temp: 97.9 F (36.6 C)  TempSrc: Oral  SpO2: 100%  Weight: 232 lb 12.8 oz (105.6 kg)  Height: 5\' 5"  (1.651 m)   General: A/O x4, in no acute distress, afebrile, nondiaphoretic  Psych: Patient with flat affect, mood is depressed, denied suicidal thoughts or ideations.   Assessment & Plan:   See Encounters Tab for problem based charting.  Patient discussed with Dr. Evette Doffing

## 2018-08-24 NOTE — Progress Notes (Signed)
PCP: Alphonzo Grieve, MD  Clinic Note: Chief Complaint  Patient presents with  . Follow-up    Essentially 1 year as opposed to 49-monthfollow-up for CAD  . Coronary Artery Disease    History of BMS to LAD    HPI: Jane [name] CHISMis a 42y.o. female with a PMH below who presents today for Four-month follow-up of CAD-PCI to LAD in May 2017 in the setting of non-STEMI. She was treated with bare-metal stent. .She has very poorly controlled diabetes with significant neuropathy and resultant Charcot foot left foot -now involving her right foot with pending surgery..  Probably as a result of her diabetes, she really does not recall any true anginal symptoms at the time of MI. She basically was dyspneic and had some GI symptoms. Following clinic visit here last year, was nephrotic range Acute Renal Failure w/ labs as part of routine follow-up -> is not on HD --> no longer on ACE inhibitor beta-blocker or Lasix.  Volume is being managed by dialysis, but is having issues with hypotension.  AVIVIANN BROYLESwas last seen in August 2018 (was supposed to be seen in 6 months with lipids checked) this never happened. -->  I stopped aspirin at that time continued Plavix.  Heart rate was better controlled with beta-blocker and diltiazem.  Was having issues with sinus tachycardia leading up to this.  Noted off-and-on chest discomfort but not exertional.  Recent Hospitalizations: None -she had a couple ER visits, but nothing warranting admission.  Studies Personally Reviewed - (if available, images/films reviewed: From Epic Chart or Care Everywhere) - No new cardiac studies  05/2018: LE Venous dopplers - no RLE DVT  Interval History: ABLONDIE RIGGSBEEpresents today for delayed follow-up with lots of new updates since the last time I saw her.  Nothing her cardiac standpoint as far as evaluation.  She is now on dialysis and has Charcot foot involving the right foot.  She actually is here for  possible preop evaluation.  It seems like her medicines have been totally changed by her nephrologist.  She is no longer on beta-blocker but is on diltiazem, and her Plavix was also stopped.  She has been out of her atorvastatin since January (having not thought to contact the office to get it refilled.  Apparently there is an issue with the pharmacy or something and nothing happened as far as anything on her part to make sure that it was filled.  She is very hard to figure out, because half the time she is not being attention during the visit.  She had 3 telephone calls while I was talking to her.  The visit lasted her next 15 minutes because any telephone calls.  She says that she has occasional chest discomfort off and on oftentimes with dialysis and on predialysis days.  She does not do much any activity besides rolling her on her scooter so is hard to tell if is exertional.  She does think it happens more when she is doing things and when not. Since she has been on dialysis she has not had any PND or orthopnea and minimal edema with exception of what is been injuries. She denies any sensation of rapid heartbeats or skipping beats.  Lightheaded and dizzy but no syncope/near syncope. She is having trouble with low blood pressures during dialysis and that is why some of her medicines were stopped.  For some reason the beta-blocker was stopped as opposed to diltiazem and her  heart rate still in the 104 bpm range.  She has had lightheadedness and dizziness but no syncope.  No TIA or amaurosis fugax symptoms. When she has chest pain and dialysis he can last anywhere from 15 minutes to an hour depending on how long the dialysis continues.  Usually she has a hard time completing the last 45 minutes to 1 hour of dialysis.  She may be needing to have surgical repair of her right foot now and is here partially for preop evaluation.  She does not walks we cannot assess claudication.   ROS: A comprehensive  was performed. Review of Systems  Constitutional: Negative for chills and fever.  HENT: Negative for nosebleeds.   Respiratory: Positive for shortness of breath. Negative for cough and sputum production.   Cardiovascular: Positive for chest pain (Per HPI). Negative for leg swelling (Mild).  Gastrointestinal: Positive for constipation, diarrhea and nausea (off & on). Negative for blood in stool and melena.  Genitourinary: Negative for hematuria.  Musculoskeletal: Negative for falls and myalgias.       Now having right foot pain -is currently in a cast  Neurological: Negative for dizziness.  Endo/Heme/Allergies: Does not bruise/bleed easily.  All other systems reviewed and are negative.   I have reviewed and (if needed) personally updated the patient's problem list, medications, allergies, past medical and surgical history, social and family history.   Past Medical History:  Diagnosis Date  . Asthma    prn inhaler  . CAD S/P BMS PCI to prox LAD 03/31/2016   Ost LAD to Mid LAD lesion, 90% stenosed. Post intervention - Vision BMS 3.0 mm x 18 mm (~3.5 mm) there is a 0% residual stenosis.   . Charcot foot due to diabetes mellitus ([place]) 11/2016   left 2018; right 2019  . Depression   . Diabetic neuropathy (HCC)    feet  . ESRD (end stage renal disease) on dialysis ([place]) 12/2017   ?chronic interstitial nephritis  . H/O non-ST elevation myocardial infarction (NSTEMI) 03/2016   Found in 90% mid LAD lesion treated with bare-metal stent (BMS) PCI - vision BMS 3.0 mm x 18 mm  . History of MRSA infection   . Hyperlipidemia   . Hypertension    medication dose increased 11/22/2015; has been taking med. consistently since 03/2016, per pt.  . Insulin dependent diabetes mellitus ([place])   . Iron deficiency anemia    takes iron supplement  . Retinopathy of both eyes   . Sickle cell trait ([place])   . 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  ACHILLES TENDON LENGTHENING Left 11/24/2016   Procedure: Left Achilles tendon lengthening (open);  Surgeon: Wylene Simmer, MD;  Location: [facility];  Service: Orthopedics;  Laterality: Left;  . AV FISTULA PLACEMENT Right 07/31/2017   Procedure: ARTERIOVENOUS BRACHIOCEPHALIC FISTULA CREATION RIGHT ARM;  Surgeon: Conrad Waynesboro, MD;  Location: [facility];  Service: Vascular;  Laterality: Right;  . CALCANEAL OSTEOTOMY Left 11/24/2016   Procedure: Left hindfoot osteotomy and fusion;  Surgeon: Wylene Simmer, MD;  Location: [facility];  Service: Orthopedics;  Laterality: Left;  . CARDIAC CATHETERIZATION N/A 03/31/2016   Procedure: Left Heart Cath and Coronary Angiography;  Surgeon: Lorretta Harp, MD;  [facility] INVASIVE CV LAB: 90% early mLAD, normal LV Fxn  . CARDIAC CATHETERIZATION N/A 03/31/2016   Procedure: Coronary Stent Intervention;  Surgeon: Lorretta Harp, MD;  Location: [place] CV  LAB;  Service: Cardiovascular: PCI to mLAD BMS Vision 3.0 mm x 18 mm  . CESAREAN SECTION  1997  . CORONARY ANGIOPLASTY    . FISTULA SUPERFICIALIZATION Right 11/15/2017   Procedure: FISTULA SUPERFICIALIZATION RIGHT BRACHIOCEPHALIC  RIGHT;  Surgeon: Conrad Lupton, MD;  Location: [place];  Service: Vascular;  Laterality: Right;  . TRANSTHORACIC ECHOCARDIOGRAM  03/2016   Normal LV size and thickness. EF 60-65%. GR 1 DD. Otherwise essentially normal.  . TUBAL LIGATION  1997     Cardiac catheterization with PCI May 2017: Diagnostic Diagram - 90% mLAD; nl LV FxnPost-Intervention Diagram - Vision BMS3.57mx 18 mm    2D Echo 03/2016: Normal LV size and thickness. EF 60-65%. GR 1 DD. Otherwise essentially normal.   Current Meds  Medication Sig  . ACCU-CHEK FASTCLIX LANCETS MISC Check blood sugar 4x/day  . acetaminophen (TYLENOL) 500 MG tablet Take 1,000 mg by mouth every 6 (six) hours as needed (for pain.).  .Marland Kitchenatorvastatin (LIPITOR) 40 MG tablet Take 1 tablet (40 mg total) by  mouth daily.  . Black Cohosh 40 MG CAPS Take 1 capsule (40 mg total) by mouth 2 (two) times daily.  . Blood Glucose Monitoring Suppl (ACCU-CHEK GUIDE) w/Device KIT 1 each by Does not apply route 3 (three) times daily.  . cetirizine (ZYRTEC) 10 MG tablet Take 1 tablet (10 mg total) by mouth daily.  . clindamycin (CLEOCIN T) 1 % lotion Apply topically 2 (two) times daily.  . diclofenac sodium (VOLTAREN) 1 % GEL Apply 2 g topically 4 (four) times daily as needed (Chest Pain).  .Marland Kitchendiltiazem (CARDIZEM CD) 120 MG 24 hr capsule Take 120 mg by mouth daily.  . fluticasone (VERAMYST) 27.5 MCG/SPRAY nasal spray Place 2 sprays into the nose daily.  .Marland Kitchenglucose blood (ACCU-CHEK GUIDE) test strip Check blood sugar up to 5 times a day  . insulin aspart (NOVOLOG) 100 UNIT/ML injection Inject 6 Units into the skin 3 (three) times daily with meals.  . insulin aspart protamine - aspart (NOVOLOG MIX 70/30 FLEXPEN) (70-30) 100 UNIT/ML FlexPen Please utilize your correction scale insulin in addition to your 6U w/ meals daily. Use 4-9 unites based on your carb counts.  . insulin glargine (LANTUS) 100 UNIT/ML injection Inject 0.24 mLs (24 Units total) into the skin at bedtime.  . Insulin Pen Needle 32G X 4 MM MISC Use to inject insulin 3 times a day  . Insulin Syringe-Needle U-100 31G X 15/64" 0.3 ML MISC Use to inject insulin daily  . megestrol (MEGACE) 40 MG tablet Take 1 tablet (40 mg total) by mouth daily. Internal Medicine Program  . multivitamin (RENA-VIT) TABS tablet Take 1 tablet by mouth daily.  .Marland KitchenPROVENTIL HFA 108 (90 Base) MCG/ACT inhaler INHALE 2 PUFFS BY MOUTH EVERY 4 HOURS AS NEEDED FOR COUGHING, WHEEZING, OR SHORTNESS OF BREATH  . RENAGEL 800 MG tablet Take 1,600-3,200 mg by mouth See admin instructions. Take 4 tablets three times daily with meals and take 2 tablets with snacks  . sertraline (ZOLOFT) 100 MG tablet Take 1 tablet (100 mg total) by mouth at bedtime.    Allergies  Allergen Reactions  .  Adhesive [Tape] Other (See Comments)    Irritation     Social History   Socioeconomic History  . Marital status: Single    Spouse name: Not on file  . Number of children: 2  . Years of education: 128 . Highest education level: Not on file  Occupational History  . Occupation:  [employer]    Employer: [employer]  . Financial resource strain: Not on file  . Food insecurity:    Worry: Not on file    Inability: Not on file  . Transportation needs:    Medical: Not on file    Non-medical: Not on file  Tobacco Use  . Smoking status: Former Smoker    Packs/day: 0.00    Years: 0.00    Pack years: 0.00    Last attempt to quit: 08/29/2015    Years since quitting: 2.9  . Smokeless tobacco: Never Used  Substance and Sexual Activity  . Alcohol use: No    Alcohol/week: 0.0 standard drinks  . Drug use: No  . Sexual activity: Yes    Birth control/protection: Surgical  Lifestyle  . Physical activity:    Days per week: Not on file    Minutes per session: Not on file  . Stress: Not on file  Relationships  . Social connections:    Talks on phone: Not on file    Gets together: Not on file    Attends religious service: Not on file    Active member of club or organization: Not on file    Attends meetings of clubs or organizations: Not on file    Relationship status: Not on file  Other Topics Concern  . Not on file  Social History Narrative   Patient does not drink caffeine.   Patient is right handed.     family history includes Aneurysm in her mother; Cancer in her paternal grandfather; Diabetes in her father, maternal grandfather, maternal grandmother, mother, and sister; Hypertension in her father, mother, and sister; Stroke in her mother.  Wt Readings from Last 3 Encounters:  08/24/18 227 lb (103 kg)  08/24/18 232 lb 12.8 oz (105.6 kg)  08/17/18 233 lb 9.6 oz (106 kg)    PHYSICAL EXAM BP 108/66   Pulse (!) 104   Ht '5\' 5"'$  (1.651 m)   Wt 227 lb (103 kg)    BMI 37.77 kg/m   - BP on recheck 134/80 mmHg (she noted foot pain walking in) Physical Exam  Constitutional: She is oriented to person, place, and time. She appears well-developed. No distress.  Morbidly obese given her risk factors and BMI of greater than 35.  Mildly disheveled and somewhat distracted  HENT:  Head: Normocephalic and atraumatic.  Eyes: Pupils are equal, round, and reactive to light. EOM are normal.  Neck: Normal range of motion. Neck supple. No hepatojugular reflux and no JVD present. Carotid bruit is not present.  Cardiovascular: Normal rate, regular rhythm, S1 normal, S2 normal and intact distal pulses.  Occasional extrasystoles are present. PMI is not displaced (Cannot palpate). Exam reveals distant heart sounds and decreased pulses (Decreased bilateral pedal pulses.  I cannot feel the right one because of cast). Exam reveals no gallop and no friction rub.  No murmur heard. Pulmonary/Chest: Effort normal and breath sounds normal. No respiratory distress. She has no wheezes. She has no rales. She exhibits no tenderness.  Abdominal: Soft. Bowel sounds are normal. She exhibits no distension. There is no tenderness. There is no rebound.  Musculoskeletal: She exhibits deformity (Left foot now has several surgical scars and right foot has plaster cast in place.  Still has the scooter.). She exhibits no edema.  Neurological: She is alert and oriented to person, place, and time.  Skin: Skin is warm and dry. No rash noted. No erythema.  Psychiatric: She has a  normal mood and affect. Her behavior is normal. Judgment and thought content normal.  Very tangential.  Very easily interrupted.  Answer phones and then made several phone calls during the visit.  Nursing note and vitals reviewed.    Adult ECG Report Sinus tachycardia, rate 104 bpm.  Anterior MI, age undetermined.  Other studies Reviewed: Additional studies/ records that were reviewed today include:  Recent Labs:   Never  had lipid labs checked like me she was supposed to last year Lab Results  Component Value Date   CHOL 125 03/03/2017   HDL 41 (L) 03/03/2017   LDLCALC 65 03/03/2017   TRIG 93 03/03/2017   CHOLHDL 3.0 03/03/2017    ASSESSMENT / PLAN:   Problem List Items Addressed This Visit    Atypical angina ([place])    Really not sure what to make of any chest discomfort symptoms she has now.  In the past she has had symptoms were similar to this that were considered unstable angina. With the potential surgery coming up I think is reasonable to assess with a Myoview stress test.  If no evidence of ischemia, would potentially consider adding Imdur for microvascular disease.  The issue is that this seems to be happening during dialysis and it may just simply be increased strain on her heart.      Relevant Medications   aspirin EC 81 MG tablet   Other Relevant Orders   EKG 12-Lead   Hepatic function panel   MYOCARDIAL PERFUSION IMAGING   CAD S/P BMS PCI to prox LAD - Primary (Chronic)    Honestly cannot tell if she is having angina or not.  It sounds like she is having chest pain during dialysis and sometimes without dialysis.  No longer on carvedilol because of hypotension during dialysis.  Is still on diltiazem but lower dose.  Therefore I am limited as far as any antianginal medications.  She remains tachycardic which is disturbing --> would like to try to go back on the dose of diltiazem to 240 as it is currently 120 mg.  According to her last visit with internal medicine she was supposed to be restarting atorvastatin, but has not yet started.  Prescription was in, just not picked up.  No longer on Plavix, stopped by nephrology with no explanation.  At this point since she has been off of it for several months and has been doing fine we will hold off on restarting and simply recommend baby aspirin.  Plan: I am a bit concerned about the intermittent episodes chest pain especially in light of her  potentially needing surgery.  I therefore plan to check a Lexiscan Myoview to exclude ischemia most notably in the LAD distribution.  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  No longer on beta-blocker.  Relevant Medications   aspirin EC 81 MG tablet   Hyperlipidemia associated with type 2 diabetes mellitus (HCC) (Chronic)   Relevant Medications   aspirin EC 81 MG tablet   Other Relevant Orders   Lipid panel   Morbid obesity ([place]) (Chronic)    Still morbidly obese because of all her risk factors.  I counseled her the importance of making sure she monitors how much she eats, especially because she is not really exercising.  I want to exclude ischemia with a stress test so that I can feel comfortable telling her to actually try to exercise. She is clearly deconditioned and would probably benefit from physical therapy once her foot is taken care of.      PAOD (peripheral arterial occlusive disease) (HCC) (Chronic)    She has not followed up with vascular surgery with exception of having her dialysis fistula checked.  When I see her back after her stress test, we can arrange to have lower extremely Dopplers followed.      Relevant Medications   aspirin EC 81 MG tablet   Preop cardiovascular exam    She has upcoming potential surgery for her Charcot foot on the right.  With  intermittent episodes of chest pain, I think I need to assess with a Myoview stress test.  Unfortunately clinically is hard to tell her symptoms.  She had a bare-metal stent to the LAD over a year ago and has not had significant angina since.  The strange thing was that her stress test showed inferior ischemia with a proximal LAD lesion.  She otherwise does not have any heart failure symptoms and any volume issues are being controlled with dialysis.  Simply because of her end-stage renal disease and severe diabetes she would be at least an intermediate risk patient without any active heart failure or angina symptoms. If her Myoview does not show any evidence of ischemia, there would be normal for any further cardiac evaluation preoperatively.  She is not on Plavix to be stopped.      Sinus tachycardia    Rate 104 beats a minute today.  Unfortunately, beta-blocker was stopped and her diltiazem dose was reduced.  Understand that she has hypotension on dialysis and that is probably the reason, but perhaps on her nondialysis days we can have her take a full 240 mg diltiazem to avoid long-standing tachycardia.  We will see what the stress test looks like first      Relevant Orders   EKG 12-Lead   MYOCARDIAL PERFUSION IMAGING     Good portion of this exam time was spent counseling her on the importance of actually following through on studies that are supposed be done.  She never had her lipid panel checked so I cannot tell with her lipids are being adequately controlled.  She answered the phone, spoke for several minutes to the person that she is talking to then called someone else texted occult people and call the other person back during the time that I was sitting there.  I left this happened because she was my last patient of the day, and it was reference having her daughter picked up from school.  I tried explained to her that this is not usually acceptable during the clinic visit. I also counseled  her on the importance of the fact she would let 8 months ago by without taking any of her statin, never once thinking that this was something that she needed to be on despite having a  stent in her LAD. She also really does not know what medicine she is on because she did bring the copy of her medicine list with her.  I am therefore having to come to piece things together.  It looks like her diltiazem dose was reduced but I did not know that today.  Total time with the patient including distractions and chart review was 45 minutes to 1 hour.  The majority of the time talking to the patient was counseling as far as the importance of medical adherence, including contacting our office if there is questions for instance reference refilling the medicine.  I think clearly that based on extent of her diabetes with Charcot foot, etc. she does not have a good grasp and understanding of the severity of her condition at a young age.  I tried to explain to her that at 42 years old having both feet being operated on for diabetes now on dialysis and having had a stent to the LAD, she needs to really take up best interest in taking care of her life otherwise she will be around for her daughter.  Current medicines are reviewed at length with the patient today. (+/- concerns) n/a The following changes have been made: n/a  Patient Instructions  Medication Instructions:    Start taking ASPIRIN 81 MG  ONE TABLET DAILY RESTART TAKING ATORVASTATIN 40 MG ONE TABLET DAILY   If you need a refill on your cardiac medications before your next appointment, please call your pharmacy.   Lab work:-- please do the lab the day of  lexiscan  Lipid Liver function profile   If you have labs (blood work) drawn today and your tests are completely normal, you will receive your results only by: Marland Kitchen MyChart Message (if you have MyChart) OR . A paper copy in the mail If you have any lab test that is abnormal or we need to change your  treatment, we will call you to review the results.  Testing/Procedures:   SCHEDULE AT [facility] has requested that you have a lexiscan myoview. For further information please visit [email or web address]. Please follow instruction sheet, as given.    Follow-Up: At [facility], you and your health needs are our priority.  As part of our continuing mission to provide you with exceptional heart care, we have created designated Provider Care Teams.  These Care Teams include your primary Cardiologist (physician) and Advanced Practice Providers (APPs -  Physician Assistants and Nurse Practitioners) who all work together to provide you with the care you need, when you need it. You will need a follow up appointment in 4 months with Glenetta Hew, MD  Any Other Special Instructions Will Be Listed Below (If Applicable).      Studies Ordered:   Orders Placed This Encounter  Procedures  . Lipid panel  . Hepatic function panel  . MYOCARDIAL PERFUSION IMAGING  . EKG 12-Lead      Glenetta Hew, M.D., M.S. Interventional Cardiologist   Pager # [phone] Phone # [phone] [address] [place], [place] [address]

## 2018-08-24 NOTE — Telephone Encounter (Signed)
Call from patient about strips- she cannot afford 15# and does not understand why they cost $ when they have nee free in the past. Call to pharmacy: he cannot tell me why [place] Medicaid is not picking up her copays. Requested smaller quantity that she can afford.

## 2018-08-24 NOTE — Telephone Encounter (Signed)
Call to [facility] :Jane [name] has not been able to get test strips from [facility] for several weeks. They tell her the prescription is incorrect.   Insurance rejecting because they needed to know date she got her meter: - she reran  And total for strips and lancets is $14.98

## 2018-08-24 NOTE — Progress Notes (Signed)
Internal Medicine Clinic Attending  Case discussed with Dr. Berline Lopes  at the time of the visit.  We reviewed the resident's history and exam and pertinent patient test results.  I agree with the assessment, diagnosis, and plan of care documented in the resident's note.  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 There is also too much variability in glycemic control on HD and non-HD days. I think a personal dexcom CGM would be ideal to guide more careful sliding scale short acting insulin dosing and alert her to hypoglycemia. We will see if we can get this covered through insurance.

## 2018-08-24 NOTE — Progress Notes (Signed)
Patient spoke about concerns for her care at her present [facility] on [address]. Has had problem with staff member there.   Call to Facility to find out what would be needed to start the process for transfer to another Facility.  Spoke with Melissa who said that patient will be placed on a list for transfer to another Center.  Process will take time as requested Center is currently full.  Patient gave 2 other sites and is willing to wait for transfer in the near future.  Was pleased that someone is looking into a possible transfer for her.   Sander Nephew, RN 08/24/2018 11:40 AM.

## 2018-08-24 NOTE — Assessment & Plan Note (Signed)
PHQ-9: Score of 19 today. After speaking with the patient she states that she does not have the funds to obtain the medication nor is willing currently to attempt further care with a counselor at this time. She believe she can obtain the medication this week but has been off of treatment for 6 days. We will refrain from advancing her treatment currently and will discuss this further at her next visit.

## 2018-08-24 NOTE — Assessment & Plan Note (Signed)
  Diabetes: Jane [name] wore the CGM for 8 days. The average reading was 186, % time in target was 50, % time below target was 2, and % time above target was. 48. Intervention will be to decrease her lantus to 24U from 28 due to early morning lows and to advise the patient on her need for carb counting and correction scale insulin in addition to her daily meal time insulin. The patient will be scheduled to see [facility] for a final appointment.   Plan: Lantus down to 24U QHS Aspart ~6U TID range from 4-9U based on carb counts. Patient was able to teach back her carb counting technique and how to avoid stacking her insulin.  I feel that she is a possible candidate for a Dexcom glucose monitoring system given her symptoms as she is often unaware that she has hypoglycemia and has tremendous variation in her serum glucose levels despite regular daily evaluation with finger stick testing. We will continue to consider this at her next visit.

## 2018-08-26 ENCOUNTER — Encounter: Payer: Self-pay | Admitting: Cardiology

## 2018-08-26 NOTE — Assessment & Plan Note (Signed)
She has not followed up with vascular surgery with exception of having her dialysis fistula checked.  When I see her back after her stress test, we can arrange to have lower extremely Dopplers followed.

## 2018-08-26 NOTE — Assessment & Plan Note (Signed)
Really not sure what to make of any chest discomfort symptoms she has now.  In the past she has had symptoms were similar to this that were considered unstable angina. With the potential surgery coming up I think is reasonable to assess with a Myoview stress test.  If no evidence of ischemia, would potentially consider adding Imdur for microvascular disease.  The issue is that this seems to be happening during dialysis and it may just simply be increased strain on her heart.

## 2018-08-26 NOTE — Assessment & Plan Note (Signed)
Rate 104 beats a minute today.  Unfortunately, beta-blocker was stopped and her diltiazem dose was reduced.  Understand that she has hypotension on dialysis and that is probably the reason, but perhaps on her nondialysis days we can have her take a full 240 mg diltiazem to avoid long-standing tachycardia.  We will see what the stress test looks like first

## 2018-08-26 NOTE — Assessment & Plan Note (Signed)
She has upcoming potential surgery for her Charcot foot on the right.  With intermittent episodes of chest pain, I think I need to assess with a Myoview stress test.  Unfortunately clinically is hard to tell her symptoms.  She had a bare-metal stent to the LAD over a year ago and has not had significant angina since.  The strange thing was that her stress test showed inferior ischemia with a proximal LAD lesion.  She otherwise does not have any heart failure symptoms and any volume issues are being controlled with dialysis.  Simply because of her end-stage renal disease and severe diabetes she would be at least an intermediate risk patient without any active heart failure or angina symptoms. If her Myoview does not show any evidence of ischemia, there would be normal for any further cardiac evaluation preoperatively.  She is not on Plavix to be stopped.

## 2018-08-26 NOTE — Assessment & Plan Note (Signed)
Still morbidly obese because of all her risk factors.  I counseled her the importance of making sure she monitors how much she eats, especially because she is not really exercising.  I want to exclude ischemia with a stress test so that I can feel comfortable telling her to actually try to exercise. She is clearly deconditioned and would probably benefit from physical therapy once her foot is taken care of.

## 2018-08-26 NOTE — Assessment & Plan Note (Addendum)
Honestly cannot tell if she is having angina or not.  It sounds like she is having chest pain during dialysis and sometimes without dialysis.  No longer on carvedilol because of hypotension during dialysis.  Is still on diltiazem but lower dose.  Therefore I am limited as far as any antianginal medications.  She remains tachycardic which is disturbing --> would like to try to go back on the dose of diltiazem to 240 as it is currently 120 mg.  According to her last visit with internal medicine she was supposed to be restarting atorvastatin, but has not yet started.  Prescription was in, just not picked up.  No longer on Plavix, stopped by nephrology with no explanation.  At this point since she has been off of it for several months and has been doing fine we will hold off on restarting and simply recommend baby aspirin.  Plan: I am a bit concerned about the intermittent episodes chest pain especially in light of her potentially needing surgery.  I therefore plan to check a Lexiscan Myoview to exclude ischemia most notably in the LAD distribution.  Start aspirin 81 mg daily  Restart atorvastatin 40 mg daily

## 2018-08-26 NOTE — Assessment & Plan Note (Signed)
No longer much of a problem on low-dose diltiazem only.  No longer on beta-blocker.

## 2018-08-28 ENCOUNTER — Telehealth: Payer: Self-pay | Admitting: *Deleted

## 2018-08-28 DIAGNOSIS — Z794 Long term (current) use of insulin: Principal | ICD-10-CM

## 2018-08-28 DIAGNOSIS — E113493 Type 2 diabetes mellitus with severe nonproliferative diabetic retinopathy without macular edema, bilateral: Secondary | ICD-10-CM

## 2018-08-28 MED ORDER — INSULIN ASPART PROT & ASPART (70-30 MIX) 100 UNIT/ML PEN: PEN_INJECTOR | SUBCUTANEOUS | 2 refills | Status: DC

## 2018-08-28 NOTE — Telephone Encounter (Signed)
Thank you.  Completed.

## 2018-08-28 NOTE — Telephone Encounter (Signed)
Fax from [facility] - requesting "a max daily dose" for Aspart protamine novolog insulin written on 10/11. Please send another rx.

## 2018-08-29 ENCOUNTER — Telehealth: Payer: Self-pay | Admitting: Dietician

## 2018-08-29 DIAGNOSIS — E113493 Type 2 diabetes mellitus with severe nonproliferative diabetic retinopathy without macular edema, bilateral: Secondary | ICD-10-CM

## 2018-08-29 DIAGNOSIS — Z794 Long term (current) use of insulin: Principal | ICD-10-CM

## 2018-08-29 MED ORDER — DEXCOM G6 RECEIVER DEVI: 1.0000 | Freq: Every day | 0 refills | Status: DC

## 2018-08-29 MED ORDER — DEXCOM G6 TRANSMITTER MISC: 1.0000 | Freq: Every day | 3 refills | Status: DC

## 2018-08-29 MED ORDER — DEXCOM G6 SENSOR MISC: 1.0000 | Freq: Every day | 12 refills | Status: DC

## 2018-08-29 NOTE — Telephone Encounter (Signed)
Script signed.  Thank you.

## 2018-08-29 NOTE — Telephone Encounter (Signed)
Called Ms. Flesch about the Dexcom G6 CGM that Drs. Harbrecht and Vincent recommended for her due to hypoglycemia unawareness. She agrees to have a prescription sent to [facility]  and if it is completely covered she wants to begin using it.  I asked her to look at their website to begin learning about it. I can arrange training when she gets it.  Debera Lat, RD 08/29/2018 12:06 PM.

## 2018-08-30 MED FILL — ATORVASTATIN 40 MG TABLET: 40 | 90 days supply | Qty: 90 | Fill #0

## 2018-08-30 MED FILL — SERTRALINE HCL 100 MG TAB: 100 | 30 days supply | Qty: 30 | Fill #1

## 2018-08-30 MED FILL — CARTIA XT 120 MG CAPSULE: 120 | 30 days supply | Qty: 30 | Fill #1

## 2018-08-31 ENCOUNTER — Telehealth: Payer: Self-pay | Admitting: *Deleted

## 2018-08-31 ENCOUNTER — Other Ambulatory Visit: Payer: Self-pay

## 2018-08-31 ENCOUNTER — Encounter: Payer: Self-pay | Admitting: Dietician

## 2018-08-31 ENCOUNTER — Encounter: Payer: Self-pay | Admitting: Internal Medicine

## 2018-08-31 ENCOUNTER — Ambulatory Visit (INDEPENDENT_AMBULATORY_CARE_PROVIDER_SITE_OTHER): Payer: Medicare Other | Admitting: Internal Medicine

## 2018-08-31 ENCOUNTER — Ambulatory Visit (INDEPENDENT_AMBULATORY_CARE_PROVIDER_SITE_OTHER): Payer: Medicare Other | Admitting: Dietician

## 2018-08-31 VITALS — BP 115/41 | HR 87 | Temp 99.2°F | Ht 65.0 in | Wt 233.4 lb

## 2018-08-31 DIAGNOSIS — E113493 Type 2 diabetes mellitus with severe nonproliferative diabetic retinopathy without macular edema, bilateral: Secondary | ICD-10-CM

## 2018-08-31 DIAGNOSIS — F339 Major depressive disorder, recurrent, unspecified: Secondary | ICD-10-CM

## 2018-08-31 DIAGNOSIS — Z713 Dietary counseling and surveillance: Secondary | ICD-10-CM

## 2018-08-31 DIAGNOSIS — Z6838 Body mass index (BMI) 38.0-38.9, adult: Secondary | ICD-10-CM | POA: Diagnosis not present

## 2018-08-31 DIAGNOSIS — Z79899 Other long term (current) drug therapy: Secondary | ICD-10-CM | POA: Diagnosis not present

## 2018-08-31 DIAGNOSIS — Z794 Long term (current) use of insulin: Secondary | ICD-10-CM | POA: Diagnosis not present

## 2018-08-31 DIAGNOSIS — F322 Major depressive disorder, single episode, severe without psychotic features: Secondary | ICD-10-CM

## 2018-08-31 LAB — GLUCOSE, CAPILLARY: Glucose-Capillary: 216 mg/dL — ABNORMAL HIGH (ref 70–99)

## 2018-08-31 MED ORDER — MEGESTROL ACETATE 40 MG PO TABS: 40.0000 mg | ORAL_TABLET | Freq: Every day | ORAL | 1 refills | Status: DC

## 2018-08-31 NOTE — Patient Instructions (Signed)
Hi Jane [name],  I am sorry that we didn't;t talk about your carb counting. Today would have been good for me to review this with you. (sorry!)   Please try to adjust your insulin for the carbs that you eat. This always safe to do.  Can start correcting your blood sugars now that you have a meter.  Please follow up in 1 month or sooner if desired.  Call anytime!   [name] [phone]

## 2018-08-31 NOTE — Telephone Encounter (Signed)
Pt called office @ 1145 am and spoke w/Tiffany - registrar. She asked to speak with a nurse regarding a refill request. I called pt back and discussed her concern. She stated that her Rx for Megace which she takes daily has expired and she needs a refill. She is currently bleeding heavily (changing a pad several times daily) and passing clots. She denies weakness or dizziness. She has been out of the medication for several days. Rx refill approved by Dr. Ihor Dow and pt will need annual gyn exam within the next 2 months. Pt was advised of Rx refill sent and need for annual gyn exam. She should come to MAU for bleeding which saturates 1 or more pads per hour for 3 consecutive hours. Pt voiced understanding of all information and instructions given.

## 2018-08-31 NOTE — Progress Notes (Signed)
  Medical Nutrition Therapy:  Appt start time: 0910 end time:  1000. Visit # 2  Assessment:  Primary concerns today: glycemic control.  Jane [name] comes in for CGM download 2. She does not feel well today or yesterday. CGM shows low blood sugars both days and overnight as well. Her glucose pattern on CGM shows flatter profile. Her food intake shows high fat and high sugar beverage choices, little fruit, minimal vegetables.  She is not adjusting her insulin for the fat she eats.  MEDICATIONS: lantus 24 units with dinner, 6 units Novolog twice a day, TDD=36 units, 67% basal, 33% bolus. She does not time mealtime insulin with food, sometimes it is after she eats  BLOOD SUGAR:cgm shows: Jane [name] wore the CGM for 14 days.  average reading: 170  % time in target: 58 (goal ~ 70%) % time below target: 5 (goal < 3) % time above target was. 37.  Coefficient of Variation: 38.5 Standard deviation: 65.5  DIETARY INTAKE: Usual eating pattern includes 2 meals and 0-1 snacks per day. Everyday foods include fried foods,chicken, .  Avoided fiids- dairy  24-hr recall:  L (12-2  PM): shrimp fried rice, chicken wings, koolaid D ( 6-8 PM): pork chop, potato salad or  fried chicken and donut,or roast beef, rice, corn, mashed potatoes Beverages: regular koolaid  Preferred Learning Style: No preference indicated  Learning Readiness: Contemplating  ANTHROPOMETRICS: Estimated body mass index is 38.84 kg/m as calculated from the following:   Height as of an earlier encounter on 08/31/18: 5\' 5"  (1.651 m).   Weight as of an earlier encounter on 08/31/18: 233 lb 6.4 oz (105.9 kg).  Progress Towards Goal(s):  some progress.              Nutritional Diagnosis:  NI-5.8.4 Inconsistent carbohydrate intake As related to conistent mealtime insulin doses and the need for them to be more closely aligned appears to be imrpving  As evidenced by improved blood sugars noted on cgm.    Intervention:  Nutrition  education about timing of meal time insulin and CGM download Coordination of care: suggest decreased basal insulin to prevent lows, am dosing . If that is not effective at decreasing hypoglycemia and hyperglycemia, could consider tresiba as basal insulin.  Am currently working on Systems developer of Dexcom CGM  Teaching Method Utilized: Visual,Auditory,Hands on Handouts given during visit include:cgm Barriers to learning/adherence to lifestyle change: competing values Demonstrated degree of understanding via:  Teach Back   Monitoring/Evaluation:  Dietary intake, exercise, meter, and body weight in 1 month(s). Jane [name], RD 08/31/2018 12:31 PM.

## 2018-08-31 NOTE — Patient Instructions (Signed)
FOLLOW-UP INSTRUCTIONS When: 3-4 months For: Routine visit What to bring: All of your medications, and your glucometer and log  I have not made any changes to your medications.   #1. Please continue to count your carbohydrates (carbs) and use your correction scale insulin every meal.   #2. Please take your short acting meal time insulin ~15-19min before your meals.   #3. Please do not take your short acting insulin if you are not eating, or take less (2-4units) if you are eating a very small meal based on the carb count.  #4. Please check your blood sugar levels if you feel unwell. If low then eat 10-15 carbs of juice. Please recheck your blood glucose in 80min if your levels remain low then repeat.  #5. Please avoid eating large carbohydrate meals such as donuts and sweat drinks such as sweat tea, soda or other.   Thank you for your visit to the [facility] today. If you have any questions or concerns please call us at [phone].

## 2018-08-31 NOTE — Assessment & Plan Note (Signed)
Diabetes: Jane [name] wore the CGM for 14 days. The average reading was 170, % time in target was 58, % time below target was 5, and % time above target was 37. Intervention will be to continue the prior recommendations to continue her correction scale insulin with meal and 24U QHS. She was unable to obtain her strips and as such did not attempt the correction scale treatment. She had multiple lows that were the result of insufficient PO intake and continued fixed insulin dosing. We discussed at length the need to continue her insulin as advised in the AVS for this visit and to notify us of any issues. The patient will be scheduled to see PCP in 2-4 months.   Plan: (Pasted from AVS) #1. Please continue to count your carbohydrates (carbs) and use your correction scale insulin every meal.   #2. Please take your short acting meal time insulin ~15-56min before your meals.   #3. Please do not take your short acting insulin if you are not eating, or take less (2-4units) if you are eating a very small meal based on the carb count.  #4. Please check your blood sugar levels if you feel unwell. If low then eat 10-15 carbs of juice. Please recheck your blood glucose in 48min if your levels remain low then repeat.  #5. Please avoid eating large carbohydrate meals such as donuts and sweat drinks such as sweat tea, soda or other.

## 2018-08-31 NOTE — Assessment & Plan Note (Signed)
Depression: PHQ-9: Based on the patients    Office Visit from 08/31/2018 in [place]  PHQ-9 Total Score  23     score we have decided to continue her medication and have her follow-up with her [facility] specialist in one week. She appears emotionally stable today and in a positive mood. She would like to see her the behavioral health specialist prior to any medication changes today.

## 2018-08-31 NOTE — Progress Notes (Signed)
Internal Medicine Clinic Attending  Case discussed with Dr. Harbrecht at the time of the visit.  We reviewed the resident's history and exam and pertinent patient test results.  I agree with the assessment, diagnosis, and plan of care documented in the resident's note.  Alexander Raines, M.D., Ph.D.  

## 2018-08-31 NOTE — Progress Notes (Signed)
CC: diabetes  HPI:Jane [name] is a 42 y.o. female who presents for evaluation of diabetes. Please see individual problem based A/P for details.  Diabetes: Jane [name] wore the CGM for 14 days. The average reading was 170, % time in target was 58, % time below target was 5, and % time above target was 37. Intervention will be to continue the prior recommendations to continue her correction scale insulin with meal and 24U QHS. She was unable to obtain her strips and as such did not attempt the correction scale treatment. She had multiple lows that were the result of insufficient PO intake and continued fixed insulin dosing. We discussed at length the need to continue her insulin as advised in the AVS for this visit and to notify us of any issues. The patient will be scheduled to see PCP in 2-4 months.   Plan: (Pasted from AVS) #1. Please continue to count your carbohydrates (carbs) and use your correction scale insulin every meal.   #2. Please take your short acting meal time insulin ~15-43min before your meals.   #3. Please do not take your short acting insulin if you are not eating, or take less (2-4units) if you are eating a very small meal based on the carb count.  #4. Please check your blood sugar levels if you feel unwell. If low then eat 10-15 carbs of juice. Please recheck your blood glucose in 62min if your levels remain low then repeat.  #5. Please avoid eating large carbohydrate meals such as donuts and sweat drinks such as sweat tea, soda or other.   Depression: PHQ-9: Based on the patients    Office Visit from 08/31/2018 in [place]  PHQ-9 Total Score  23     score we have decided to continue her medication and have her follow-up with her [facility] specialist in one week. She appears emotionally stable today and in a positive mood. She would like to see her the behavioral health specialist prior to any medication changes today.   Past  Medical History:  Diagnosis Date  . Asthma    prn inhaler  . CAD S/P BMS PCI to prox LAD 03/31/2016   Ost LAD to Mid LAD lesion, 90% stenosed. Post intervention - Vision BMS 3.0 mm x 18 mm (~3.5 mm) there is a 0% residual stenosis.   . Charcot foot due to diabetes mellitus ([place]) 11/2016   left 2018; right 2019  . Depression   . Diabetic neuropathy (HCC)    feet  . ESRD (end stage renal disease) on dialysis ([place]) 12/2017   ?chronic interstitial nephritis  . H/O non-ST elevation myocardial infarction (NSTEMI) 03/2016   Found in 90% mid LAD lesion treated with bare-metal stent (BMS) PCI - vision BMS 3.0 mm x 18 mm  . History of MRSA infection   . Hyperlipidemia   . Hypertension    medication dose increased 11/22/2015; has been taking med. consistently since 03/2016, per pt.  . Insulin dependent diabetes mellitus ([place])   . Iron deficiency anemia    takes iron supplement  . Retinopathy of both eyes   . Sickle cell trait ([place])   . 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  Physical Exam: Vitals:   08/31/18 1010  BP: (!) 115/41  Pulse: 87  Temp: 99.2 F (37.3 C)  TempSrc: Oral  SpO2: 100%  Weight: 233 lb 6.4 oz (105.9 kg)  Height: 5\' 5"  (1.651 m)   General: A/O x4, in no acute distress, afebrile, nondiaphoretic Cardio: RRR, no mrg's  Assessment & Plan:   See Encounters Tab for problem based charting.  Patient discussed with Dr. Rebeca Alert

## 2018-09-04 ENCOUNTER — Telehealth: Payer: Self-pay | Admitting: Obstetrics & Gynecology

## 2018-09-04 ENCOUNTER — Other Ambulatory Visit: Payer: Self-pay | Admitting: Dietician

## 2018-09-04 DIAGNOSIS — E113493 Type 2 diabetes mellitus with severe nonproliferative diabetic retinopathy without macular edema, bilateral: Secondary | ICD-10-CM

## 2018-09-04 DIAGNOSIS — Z794 Long term (current) use of insulin: Principal | ICD-10-CM

## 2018-09-04 MED ORDER — DEXCOM G6 TRANSMITTER MISC: 1.0000 | Freq: Every day | 3 refills | Status: DC

## 2018-09-04 MED ORDER — DEXCOM G6 RECEIVER DEVI: 1.0000 | Freq: Every day | 0 refills | Status: DC

## 2018-09-04 MED ORDER — DEXCOM G6 SENSOR MISC: 1.0000 | Freq: Every day | 11 refills | Status: DC

## 2018-09-04 NOTE — Telephone Encounter (Signed)
Called patient to give her an appointment with Dr Ihor Dow. Her phone VM was full, I could not leave a message about her appointment. I will be sending her a reminder appointment letter.

## 2018-09-04 NOTE — Telephone Encounter (Signed)
I called Dex[employer] for help with submitting the Medicare prescription. The order needs to be sent to [facility] and they will process it and contact [name]. I contacted [name] to tell her to contact me when she gets the receiver, transmitter and sensors,  and I will arrange a time for training on how to use it. She verbalized understanding.

## 2018-09-07 ENCOUNTER — Telehealth (HOSPITAL_COMMUNITY): Payer: Self-pay

## 2018-09-07 NOTE — Telephone Encounter (Signed)
Unable to reach patient. Encounter complete. 

## 2018-09-11 ENCOUNTER — Emergency Department (HOSPITAL_COMMUNITY)
Admission: EM | Admit: 2018-09-11 | Discharge: 2018-09-11 | Disposition: A | Discharge status: Home / Self Care | Payer: Medicare Other | Attending: Emergency Medicine | Admitting: Emergency Medicine

## 2018-09-11 ENCOUNTER — Other Ambulatory Visit: Payer: Self-pay

## 2018-09-11 ENCOUNTER — Encounter (HOSPITAL_COMMUNITY): Payer: Self-pay | Admitting: *Deleted

## 2018-09-11 ENCOUNTER — Telehealth (HOSPITAL_COMMUNITY): Payer: Self-pay

## 2018-09-11 ENCOUNTER — Telehealth: Payer: Self-pay | Admitting: *Deleted

## 2018-09-11 ENCOUNTER — Emergency Department (HOSPITAL_COMMUNITY): Payer: Medicare Other

## 2018-09-11 DIAGNOSIS — R110 Nausea: Secondary | ICD-10-CM

## 2018-09-11 DIAGNOSIS — J45909 Unspecified asthma, uncomplicated: Secondary | ICD-10-CM | POA: Diagnosis not present

## 2018-09-11 DIAGNOSIS — Z7982 Long term (current) use of aspirin: Secondary | ICD-10-CM | POA: Diagnosis not present

## 2018-09-11 DIAGNOSIS — R072 Precordial pain: Secondary | ICD-10-CM | POA: Diagnosis not present

## 2018-09-11 DIAGNOSIS — E119 Type 2 diabetes mellitus without complications: Secondary | ICD-10-CM | POA: Diagnosis not present

## 2018-09-11 DIAGNOSIS — I120 Hypertensive chronic kidney disease with stage 5 chronic kidney disease or end stage renal disease: Secondary | ICD-10-CM | POA: Insufficient documentation

## 2018-09-11 DIAGNOSIS — N186 End stage renal disease: Secondary | ICD-10-CM | POA: Diagnosis not present

## 2018-09-11 DIAGNOSIS — Z794 Long term (current) use of insulin: Secondary | ICD-10-CM | POA: Insufficient documentation

## 2018-09-11 DIAGNOSIS — Z87891 Personal history of nicotine dependence: Secondary | ICD-10-CM | POA: Diagnosis not present

## 2018-09-11 DIAGNOSIS — Z992 Dependence on renal dialysis: Secondary | ICD-10-CM

## 2018-09-11 DIAGNOSIS — Z79899 Other long term (current) drug therapy: Secondary | ICD-10-CM | POA: Insufficient documentation

## 2018-09-11 DIAGNOSIS — R079 Chest pain, unspecified: Secondary | ICD-10-CM | POA: Diagnosis present

## 2018-09-11 LAB — COMPREHENSIVE METABOLIC PANEL
ALK PHOS: 100 U/L (ref 38–126)
ALT: 17 U/L (ref 0–44)
ANION GAP: 15 (ref 5–15)
AST: 20 U/L (ref 15–41)
Albumin: 4 g/dL (ref 3.5–5.0)
BILIRUBIN TOTAL: 0.6 mg/dL (ref 0.3–1.2)
BUN: 25 mg/dL — ABNORMAL HIGH (ref 6–20)
CALCIUM: 8.6 mg/dL — AB (ref 8.9–10.3)
CO2: 29 mmol/L (ref 22–32)
Chloride: 93 mmol/L — ABNORMAL LOW (ref 98–111)
Creatinine, Ser: 7.17 mg/dL — ABNORMAL HIGH (ref 0.44–1.00)
GFR calc Af Amer: 7 mL/min — ABNORMAL LOW (ref >60)
GFR calc non Af Amer: 6 mL/min — ABNORMAL LOW (ref >60)
Glucose, Bld: 150 mg/dL — ABNORMAL HIGH (ref 70–99)
Potassium: 3.9 mmol/L (ref 3.5–5.1)
SODIUM: 137 mmol/L (ref 135–145)
TOTAL PROTEIN: 8.3 g/dL — AB (ref 6.5–8.1)

## 2018-09-11 LAB — CBC
HCT: 38.2 % (ref 36.0–46.0)
HEMOGLOBIN: 11.7 g/dL — AB (ref 12.0–15.0)
MCH: 27.3 pg (ref 26.0–34.0)
MCHC: 30.6 g/dL (ref 30.0–36.0)
MCV: 89 fL (ref 80.0–100.0)
Platelets: 274 10*3/uL (ref 150–400)
RBC: 4.29 MIL/uL (ref 3.87–5.11)
RDW: 14.6 % (ref 11.5–15.5)
WBC: 7.9 10*3/uL (ref 4.0–10.5)
nRBC: 0 % (ref 0.0–0.2)

## 2018-09-11 LAB — TROPONIN I: Troponin I: <0.03 ng/mL (ref <0.03)

## 2018-09-11 LAB — CK: CK TOTAL: 171 U/L (ref 38–234)

## 2018-09-11 MED ORDER — ONDANSETRON HCL 4 MG/2ML IJ SOLN
4.0000 mg | Freq: Once | INTRAMUSCULAR | Status: AC
  Administered 2018-09-11: 4 mg via INTRAVENOUS
  Filled 2018-09-11: qty 2

## 2018-09-11 MED ORDER — MECLIZINE HCL 25 MG PO TABS: 25.0000 mg | ORAL_TABLET | Freq: Three times a day (TID) | ORAL | 0 refills | Status: DC | PRN

## 2018-09-11 MED ORDER — LORAZEPAM 1 MG PO TABS
1.0000 mg | ORAL_TABLET | Freq: Once | ORAL | Status: AC
  Administered 2018-09-11: 1 mg via ORAL
  Filled 2018-09-11: qty 1

## 2018-09-11 NOTE — ED Triage Notes (Signed)
Patient present ED from [facility] center c/o intermittent chest pain onset 3-4 weeks  States she also has a metallic taste in her mouth for several weeks, states she is scheduled for cardiac stress test tomorrow, was able to complete all but 20 minutes of her dialysis today. States she didn't take her medication  Last pm. Also she didn't go to dialysis on Sat. Patient is alert oriented skin w/d

## 2018-09-11 NOTE — ED Provider Notes (Signed)
[facility] Provider Note   CSN: [id number] Arrival date & time: 09/11/18  1215     History   Chief Complaint Chief Complaint  Patient presents with  . Chest Pain    HPI Jane [name] is a 42 y.o. female with a history of CAD s/p bare metal stent in 2017, IDDM, Charcot foot, PAOD, ESRD on HD (T/R/S) who presents to the emergency department from her dialysis center by EMS with a chief complaint of chest pain.  The patient reports that she is had chest pain frequently during dialysis for several months, but reports that today she had chest pain that she characterizes as dull and achy that is been coming and going since onset with associated dyspnea, dizziness, generalized weakness, lightheadedness, headache, and severe nausea.  She reports the pressure-like headache, weakness, and lightheadedness have improved since onset, but she continues to have intermittent episodes of chest pain and dyspnea since arrival in the ED.  She states that these episodes will last for a few seconds before resolving.  She states that she had approximately 26 minutes left in her treatment when she asked them to stop her session.  She reports that they checked her blood pressure while she was sitting and her systolic pressure was 625, but when they rechecked it when she stood up her systolic pressure was 638.  She reports that she did undergo a laser treatment for her eye yesterday and was told that she may have worsening nausea today. She also notes that she had an episode of diaphoresis when she awoke last night.  She also notes that she has had a persistent metallic taste in her mouth for the last 3 weeks as well as low grade fever.  Denies emesis, abdominal pain, back pain, rash, seizures, confusion, visual changes, URI symptoms.  States she is also having muscular pain in her bilateral thighs and calves, but denies leg swelling.  She reports that she is  scheduled for a cardiac stress test in the next 1 to 2 days with her cardiologist.  She also notes that she has been off of Plavix for sometime.   She also reports that she has struggled with labile blood pressure around her dialysis sessions for several months.  She is currently dialyzed on a Tuesday, Thursday, Saturday session at [facility], but has put in a request for change in her treatment center which has been denied.  The history is provided by the patient. No language interpreter was used.    Past Medical History:  Diagnosis Date  . Asthma    prn inhaler  . CAD S/P BMS PCI to prox LAD 03/31/2016   Ost LAD to Mid LAD lesion, 90% stenosed. Post intervention - Vision BMS 3.0 mm x 18 mm (~3.5 mm) there is a 0% residual stenosis.   . Charcot foot due to diabetes mellitus ([place]) 11/2016   left 2018; right 2019  . Depression   . Diabetic neuropathy (HCC)    feet  . ESRD (end stage renal disease) on dialysis ([place]) 12/2017   ?chronic interstitial nephritis  . H/O non-ST elevation myocardial infarction (NSTEMI) 03/2016   Found in 90% mid LAD lesion treated with bare-metal stent (BMS) PCI - vision BMS 3.0 mm x 18 mm  . History of MRSA infection   . Hyperlipidemia   . Hypertension    medication dose increased 11/22/2015; has been taking med. consistently since 03/2016, per pt.  . Insulin dependent diabetes mellitus (  HCC)   . Iron deficiency anemia    takes iron supplement  . Retinopathy of both eyes   . Sickle cell trait ([place])   . 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    Patient Active Problem List   Diagnosis Date Noted  . Hidradenitis suppurativa 02/12/2018  . Major depressive disorder 08/11/2017  . Sinus tachycardia 02/23/2017  . ESRD (end stage renal disease) on dialysis ([place]) 01/05/2017  . Vitreous floater, left 11/16/2016  . CAD S/P BMS PCI to prox LAD 03/31/2016  . Atypical angina ([place]) 03/29/2016  . Dysfunctional uterine bleeding 03/17/2016  . PAOD (peripheral  arterial occlusive disease) ([place]) 01/08/2016  . Charcot foot due to diabetes mellitus ([place]) 09/07/2015  . Peripheral neuropathy 09/20/2014  . Essential hypertension 08/16/2014  . Seasonal allergic rhinitis 08/16/2014  . Iron deficiency anemia 10/25/2013  . Preop cardiovascular exam 10/25/2013  . Morbid obesity ([place]) 09/26/2012  . Hyperlipidemia associated with type 2 diabetes mellitus ([place]) 02/26/2009  . 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, with long-term current use of insulin ([place]) 05/08/2007  . Asthma 05/08/2007    Past Surgical History:  Procedure Laterality Date  . ACHILLES TENDON LENGTHENING Left 11/24/2016   Procedure: Left Achilles tendon lengthening (open);  Surgeon: Wylene Simmer, MD;  Location: [facility];  Service: Orthopedics;  Laterality: Left;  . AV FISTULA PLACEMENT Right 07/31/2017   Procedure: ARTERIOVENOUS BRACHIOCEPHALIC FISTULA CREATION RIGHT ARM;  Surgeon: Conrad [place], MD;  Location: [facility];  Service: Vascular;  Laterality: Right;  . CALCANEAL OSTEOTOMY Left 11/24/2016   Procedure: Left hindfoot osteotomy and fusion;  Surgeon: Wylene Simmer, MD;  Location: [facility];  Service: Orthopedics;  Laterality: Left;  . CARDIAC CATHETERIZATION N/A 03/31/2016   Procedure: Left Heart Cath and Coronary Angiography;  Surgeon: Lorretta Harp, MD;  Location: [facility] INVASIVE CV LAB: 90% early mLAD, normal LV Fxn  . CARDIAC CATHETERIZATION N/A 03/31/2016   Procedure: Coronary Stent Intervention;  Surgeon: Lorretta Harp, MD;  Location: [facility] CV LAB;  Service: Cardiovascular: PCI to mLAD BMS Vision 3.0 mm x 18 mm  . CESAREAN SECTION  1997  . CORONARY ANGIOPLASTY    . FISTULA SUPERFICIALIZATION Right 11/15/2017   Procedure: FISTULA SUPERFICIALIZATION RIGHT BRACHIOCEPHALIC  RIGHT;  Surgeon: Conrad [place], MD;  Location: [facility];  Service: Vascular;  Laterality: Right;  . TRANSTHORACIC ECHOCARDIOGRAM  03/2016   Normal LV size  and thickness. EF 60-65%. GR 1 DD. Otherwise essentially normal.  . TUBAL LIGATION  1997     OB History    Gravida  2   Para  2   Term  1   Preterm  1   AB  0   Living  2     SAB  0   TAB  0   Ectopic  0   Multiple  0   Live Births               Home Medications    Prior to Admission medications   Medication Sig Start Date End Date Taking? Authorizing Provider  acetaminophen (TYLENOL) 500 MG tablet Take 1,000 mg by mouth every 6 (six) hours as needed (for pain.).   Yes [provider]  aspirin EC 81 MG tablet Take 1 tablet (81 mg total) by mouth daily. 08/24/18  Yes [name], MD  atorvastatin (LIPITOR) 40 MG tablet Take 1 tablet (40 mg total) by mouth daily. 08/01/18  Yes Alphonzo Grieve, MD  diltiazem (CARDIZEM CD) 120 MG 24 hr capsule Take 120 mg by mouth daily.   Yes [provider]  insulin aspart (NOVOLOG) 100 UNIT/ML injection Inject 6 Units into the skin 3 (three) times daily with meals. 08/01/17  Yes Kathi Ludwig, MD  insulin glargine (LANTUS) 100 UNIT/ML injection Inject 0.24 mLs (24 Units total) into the skin at bedtime. 08/24/18  Yes Kathi Ludwig, MD  megestrol (MEGACE) 40 MG tablet Take 1 tablet (40 mg total) by mouth daily. 08/31/18  Yes Lavonia Drafts, MD  multivitamin (RENA-VIT) TABS tablet Take 1 tablet by mouth daily. 04/30/18  Yes [provider]  PROVENTIL HFA 108 (90 Base) MCG/ACT inhaler INHALE 2 PUFFS BY MOUTH EVERY 4 HOURS AS NEEDED FOR COUGHING, WHEEZING, OR SHORTNESS OF BREATH Patient taking differently: Inhale 2 puffs into the lungs every 4 (four) hours as needed for shortness of breath.  03/24/17  Yes Alphonzo Grieve, MD  sertraline (ZOLOFT) 100 MG tablet Take 1 tablet (100 mg total) by mouth at bedtime. 03/20/18  Yes Alphonzo Grieve, MD  ACCU-CHEK FASTCLIX LANCETS MISC Check blood sugar 4x/day 08/10/18   Alphonzo Grieve, MD  Black Cohosh 40 MG CAPS Take 1 capsule (40 mg total) by mouth 2  (two) times daily. Patient not taking: Reported on 09/11/2018 03/12/18   Katheren Shams, DO  Blood Glucose Monitoring Suppl (ACCU-CHEK GUIDE) w/Device KIT 1 each by Does not apply route 3 (three) times daily. 05/18/17   Alphonzo Grieve, MD  cetirizine (ZYRTEC) 10 MG tablet Take 1 tablet (10 mg total) by mouth daily. Patient not taking: Reported on 09/11/2018 04/19/18   Neva Seat, MD  clindamycin (CLEOCIN T) 1 % lotion Apply topically 2 (two) times daily. Patient not taking: Reported on 09/11/2018 02/26/18   Molt, Bethany, DO  Continuous Blood Gluc Receiver (DEXCOM G6 RECEIVER) DEVI 1 each by Does not apply route 6 (six) times daily. 09/04/18   Kathi Ludwig, MD  Continuous Blood Gluc Sensor (DEXCOM G6 SENSOR) MISC 1 each by Does not apply route 6 (six) times daily. 09/04/18   Kathi Ludwig, MD  Continuous Blood Gluc Transmit (DEXCOM G6 TRANSMITTER) MISC 1 each by Does not apply route 6 (six) times daily. 09/04/18   Kathi Ludwig, MD  diclofenac sodium (VOLTAREN) 1 % GEL Apply 2 g topically 4 (four) times daily as needed (Chest Pain). Patient not taking: Reported on 09/11/2018 04/19/18   Neva Seat, MD  fluticasone (VERAMYST) 27.5 MCG/SPRAY nasal spray Place 2 sprays into the nose daily. Patient not taking: Reported on 09/11/2018 04/19/18   Neva Seat, MD  glucose blood (ACCU-CHEK GUIDE) test strip Check blood sugar up to 5 times a day 08/17/18   Aldine Contes, MD  insulin aspart protamine - aspart (NOVOLOG MIX 70/30 FLEXPEN) (70-30) 100 UNIT/ML FlexPen Use 4-9U three times daily based on your carb counts and correction scale for a maximum of 24U daily 08/28/18   Kathi Ludwig, MD  Insulin Pen Needle 32G X 4 MM MISC Use to inject insulin 3 times a day 08/17/18   Aldine Contes, MD  Insulin Syringe-Needle U-100 31G X 15/64" 0.3 ML MISC Use to inject insulin daily 08/17/18   Aldine Contes, MD  RENAGEL 800 MG tablet Take 4,800 mg by mouth See admin  instructions. Take 4800 mg tablets three times daily with meals 05/07/18   [provider]    Family History Family History  Problem Relation Age of Onset  . Stroke Mother   . Diabetes Mother   . Hypertension  Mother   . Aneurysm Mother   . Diabetes Father   . Hypertension Father   . Diabetes Sister   . Hypertension Sister   . Diabetes Maternal Grandmother   . Diabetes Maternal Grandfather   . Cancer Paternal Grandfather        Prostate    Social History Social History   Tobacco Use  . Smoking status: Former Smoker    Packs/day: 0.00    Years: 0.00    Pack years: 0.00    Last attempt to quit: 08/29/2015    Years since quitting: 3.0  . Smokeless tobacco: Never Used  Substance Use Topics  . Alcohol use: No    Alcohol/week: 0.0 standard drinks  . Drug use: No     Allergies   Adhesive [tape]   Review of Systems Review of Systems  Constitutional: Negative for activity change, chills and fever.  HENT: Negative for congestion, sore throat and voice change.   Eyes: Negative for visual disturbance.  Respiratory: Positive for shortness of breath.   Cardiovascular: Positive for chest pain. Negative for leg swelling.  Gastrointestinal: Positive for nausea. Negative for abdominal pain, diarrhea and vomiting.  Genitourinary: Negative for dysuria and urgency.  Musculoskeletal: Negative for back pain, myalgias, neck pain and neck stiffness.  Skin: Negative for rash.  Allergic/Immunologic: Negative for immunocompromised state.  Neurological: Positive for dizziness, weakness (generalized), light-headedness and headaches. Negative for syncope and numbness.  Psychiatric/Behavioral: Negative for confusion and self-injury.   Physical Exam Updated Vital Signs BP (!) 137/105   Pulse 98   Temp 98.8 F (37.1 C) (Oral)   Resp 16   Ht _0  (1.651 m)   Wt 102 kg   LMP 09/11/2018   SpO2 100%   BMI 37.42 kg/m   Physical Exam  Constitutional:  Non-toxic appearance. She  does not appear ill. No distress.  Well appearing.   HENT:  Head: Normocephalic.  Eyes: Pupils are equal, round, and reactive to light. Conjunctivae and EOM are normal. Right eye exhibits no discharge. Left eye exhibits no discharge. No scleral icterus.  Neck: Normal range of motion. Neck supple. No JVD present.  Cardiovascular: Normal rate, regular rhythm, normal heart sounds and intact distal pulses. Exam reveals no gallop, no S3, no S4 and no friction rub.  No murmur heard. Pulses:      Radial pulses are 2+ on the right side, and 2+ on the left side.  Pulmonary/Chest: Effort normal. No stridor. No respiratory distress. She has no wheezes. She has no rales. She exhibits no tenderness.  Abdominal: Soft. Bowel sounds are normal. She exhibits no distension and no mass. There is no tenderness. There is no rebound and no guarding. No hernia.  Musculoskeletal: She exhibits no tenderness.  Walking boots in place over the bilateral lower extremities. No lower extremity edema.   Neurological: She is alert.  A&O x4   Skin: Skin is warm. Capillary refill takes less than 2 seconds. No rash noted.  Psychiatric: Her behavior is normal. Her mood appears not anxious.  Nursing note and vitals reviewed.  ED Treatments / Results  Labs (all labs ordered are listed, but only abnormal results are displayed) Labs Reviewed  CBC - Abnormal; Notable for the following components:      Result Value   Hemoglobin 11.7 (*)    All other components within normal limits  COMPREHENSIVE METABOLIC PANEL - Abnormal; Notable for the following components:   Chloride 93 (*)    Glucose, Bld 150 (*)  BUN 25 (*)    Creatinine, Ser 7.17 (*)    Calcium 8.6 (*)    Total Protein 8.3 (*)    GFR calc non Af Amer 6 (*)    GFR calc Af Amer 7 (*)    All other components within normal limits  TROPONIN I  CK  TROPONIN I  I-STAT TROPONIN, ED  I-STAT BETA HCG BLOOD, ED ([facility], [facility], [facility] ONLY)    EKG EKG  Interpretation  Date/Time:  Tuesday September 11 2018 12:16:33 EDT Ventricular Rate:  99 PR Interval:    QRS Duration: 84 QT Interval:  372 QTC Calculation: 478 R Axis:   121 Text Interpretation:  Sinus rhythm Probable lateral infarct, old Confirmed by Davonna Belling [phone]) on 09/11/2018 12:48:33 PM   Radiology Dg Chest 2 View  Result Date: 09/11/2018 CLINICAL DATA:  Chest pain and shortness of breath EXAM: CHEST - 2 VIEW COMPARISON:  November 23, 2016 FINDINGS: There is no edema or consolidation. Heart size and pulmonary vascularity are normal. No adenopathy. No pneumothorax. No bone lesions. IMPRESSION: No edema or consolidation. Electronically Signed   By: Lowella Grip III M.D.   On: 09/11/2018 14:08    Procedures Procedures (including critical care time)  Medications Ordered in ED Medications  LORazepam (ATIVAN) tablet 1 mg (has no administration in time range)  ondansetron (ZOFRAN) injection 4 mg (4 mg Intravenous Given 09/11/18 1339)     Initial Impression / Assessment and Plan / ED Course  I have reviewed the triage vital signs and the nursing notes.  Pertinent labs & imaging results that were available during my care of the patient were reviewed by me and considered in my medical decision making (see chart for details).    42 year old  with a history of CAD s/p bare metal stent in 2017, IDDM, Charcot foot, PAOD, ESRD on HD (T/R/S) who presents to the ED by EMS from her dialysis center with intermittent chest pain, severe nausea, dyspnea, headache, generalized weakness, and lightheadedness that began while she was at dialysis.  Patient has had similar episodes during previous dialysis sessions, but today was worse.  Dialysis session ended 25 to 30 minutes early and she missed her last dialysis session 3 days ago.  Denies chest pain at this time in the ED, but is endorsing severe nausea.  She has a bare-metal stent in place and has been off her Plavix after it was  presumably discontinued by nephrology.  She is scheduled for an outpatient stress test with cardiology tomorrow.  Zofran given for nausea; however EKG with increasing QTC  478, but no ischemic changes. Initial troponin is negative. CXR is unremarkable. Potassium is normal. Labs are otherwise reassuring and at the patient's baseline. No orthostatic hypotension  On re-evaluation, the patient endorses continued nausea. States it may be worse today after having an eye laser procedure performed yesterday and nausea was listed as a side effect. Will given oral Ativan given longer QTC from previous.   Consulted cardiology and spoke with Dr. Glenford Peers given the patient is scheduled for a stress test tomorrow in the cardiology clinic tomorrow with chest pain today. He recommended if her repeat troponin is negative and the patient is asymptomatic can discharge her and have her follow up for there outpatient exam. If uptrending, she will require admission. Patient care transferred to PA  [place] at the end of my shift. Patient presentation, ED course, and plan of care discussed with review of all pertinent labs and imaging. Please see  his/her note for further details regarding further ED course and disposition.   Final Clinical Impressions(s) / ED Diagnoses   Final diagnoses:  None    ED Discharge Orders    None       Joanne Gavel, PA-C 09/11/18 1701    Davonna Belling, MD 09/12/18 0710

## 2018-09-11 NOTE — ED Provider Notes (Signed)
6:15 PM handoff from [place] PA-C at shift change.  Patient is a end-stage renal disease patient who is on hemodialysis who presents with nausea and some short-lived fleeting chest pains today.  Patient has had a history of coronary artery disease and had a stent placed 2 years ago.  Cardiology contacted by previous provider.  Current plan is for 2 troponins.  Patient already is scheduled for nuclear stress test tomorrow.  Second troponin is negative.  Patient seen.  She currently complains of nausea.  She denies being in any pain or having any current chest pain.  At this time patient will be discharged home.  Encouraged to return with worsening or changing symptoms.  She is planning to follow-up for her nuclear stress test tomorrow as planned.  Given prolonged QT, will be discharged home with meclizine for nausea.  BP (!) 137/105   Pulse 98   Temp 98.8 F (37.1 C) (Oral)   Resp 16   Ht 5\' 5"  (1.651 m)   Wt 102 kg   LMP 09/11/2018   SpO2 100%   BMI 37.42 kg/m     [name], PA-C 09/11/18 [place], [place], DO 09/12/18 0028

## 2018-09-11 NOTE — Telephone Encounter (Signed)
Patient called into office. She states she spoke her kidney doctor-  He does not remember telling her that she need to stop Plavix.  Per patient ,she states the kidney doctor - it would be okay to take Plavix.  Patient wanted to know if Dr [name] want her to restart medication-Plavix 75 mg.   Patient aware will defer to Dr [name] and call her back

## 2018-09-11 NOTE — Telephone Encounter (Signed)
Encounter complete. 

## 2018-09-11 NOTE — Discharge Instructions (Signed)
Please read and follow all provided instructions.  Your diagnoses today include:  1. Nausea   2. Precordial pain   3. ESRD (end stage renal disease) on dialysis [facility])     Tests performed today include:  An EKG of your heart  A chest x-ray  Cardiac enzymes - a blood test for heart muscle damage  Blood counts and electrolytes  Vital signs. See below for your results today.   Medications prescribed:   Meclizine - medication for nausea  Take any prescribed medications only as directed.  Follow-up instructions: Please follow-up for your stress test tomorrow as planned.  Return instructions:  SEEK IMMEDIATE MEDICAL ATTENTION IF:  You have severe chest pain, especially if the pain is crushing or pressure-like and spreads to the arms, back, neck, or jaw, or if you have sweating, nausea (feeling sick to your stomach), or shortness of breath. THIS IS AN EMERGENCY. Don't wait to see if the pain will go away. Get medical help at once. Call 911 or 0 (operator). DO NOT drive yourself to the hospital.   Your chest pain gets worse and does not go away with rest.   You have an attack of chest pain lasting longer than usual, despite rest and treatment with the medications your caregiver has prescribed.   You wake from sleep with chest pain or shortness of breath.  You feel dizzy or faint.  You have chest pain not typical of your usual pain for which you originally saw your caregiver.   You have any other emergent concerns regarding your health.  Additional Information: Chest pain comes from many different causes. Your caregiver has diagnosed you as having chest pain that is not specific for one problem, but does not require admission.  You are at low risk for an acute heart condition or other serious illness.   Your vital signs today were: BP (!) 137/105    Pulse 98    Temp 98.8 F (37.1 C) (Oral)    Resp 16    Ht 5\' 5"  (1.651 m)    Wt 102 kg    LMP 09/11/2018    SpO2 100%    BMI  37.42 kg/m  If your blood pressure (BP) was elevated above 135/85 this visit, please have this repeated by your doctor within one month. --------------

## 2018-09-11 NOTE — ED Notes (Signed)
Patient transported to X-ray 

## 2018-09-12 ENCOUNTER — Ambulatory Visit (HOSPITAL_COMMUNITY)
Admission: RE | Admit: 2018-09-12 | Discharge: 2018-09-12 | Disposition: A | Discharge status: Home / Self Care | Payer: Medicare Other | Source: Ambulatory Visit | Attending: Cardiology | Admitting: Cardiology

## 2018-09-12 DIAGNOSIS — R000 Tachycardia, unspecified: Secondary | ICD-10-CM | POA: Insufficient documentation

## 2018-09-12 DIAGNOSIS — Z9861 Coronary angioplasty status: Secondary | ICD-10-CM | POA: Diagnosis not present

## 2018-09-12 DIAGNOSIS — I208 Other forms of angina pectoris: Secondary | ICD-10-CM | POA: Insufficient documentation

## 2018-09-12 DIAGNOSIS — I2510 Atherosclerotic heart disease of native coronary artery without angina pectoris: Secondary | ICD-10-CM | POA: Diagnosis not present

## 2018-09-12 MED ORDER — TECHNETIUM TC 99M TETROFOSMIN IV KIT
27.5000 | PACK | Freq: Once | INTRAVENOUS | Status: AC | PRN
  Administered 2018-09-12: 27.5 via INTRAVENOUS
  Filled 2018-09-12: qty 28

## 2018-09-12 MED ORDER — CLOPIDOGREL BISULFATE 75 MG PO TABS: 75.0000 mg | ORAL_TABLET | Freq: Every day | ORAL | 3 refills | Status: DC

## 2018-09-12 MED ORDER — REGADENOSON 0.4 MG/5ML IV SOLN
0.4000 mg | Freq: Once | INTRAVENOUS | Status: AC
  Administered 2018-09-12: 0.4 mg via INTRAVENOUS

## 2018-09-12 MED FILL — CLOPIDOGREL 75 MG TABLET: 75 | 30 days supply | Qty: 30 | Fill #5

## 2018-09-12 NOTE — Telephone Encounter (Signed)
PATIENT IS PRESENT IN THE OFFICE TODAY . RN REVIEWED AND DISCUSSED WITH DR [name].  PER DR HARDING - YES ,RESTART PLAVIX 75 MG DAILY - FOR THE FIRST DAY TAKE 300 MG ( 4 TABLETS) DOSE .   PATIENT STATES SHE HAS PRESCRIPTION AT HOME AN DOES NOT NEED A RX SENT TO PHARMACY.

## 2018-09-13 ENCOUNTER — Telehealth: Payer: Self-pay | Admitting: Dietician

## 2018-09-13 ENCOUNTER — Ambulatory Visit (HOSPITAL_COMMUNITY)
Admission: RE | Admit: 2018-09-13 | Discharge: 2018-09-13 | Disposition: A | Discharge status: Home / Self Care | Payer: Medicare Other | Source: Ambulatory Visit | Attending: Cardiology | Admitting: Cardiology

## 2018-09-13 LAB — MYOCARDIAL PERFUSION IMAGING
CSEPPHR: 105 {beats}/min
LV dias vol: 90 mL (ref 46–106)
LVSYSVOL: 40 mL
Rest HR: 100 {beats}/min
SDS: 5
SRS: 1
SSS: 6
TID: 0.94

## 2018-09-13 MED ORDER — TECHNETIUM TC 99M TETROFOSMIN IV KIT
30.8000 | PACK | Freq: Once | INTRAVENOUS | Status: AC | PRN
  Administered 2018-09-13: 30.8 via INTRAVENOUS

## 2018-09-14 ENCOUNTER — Telehealth: Payer: Self-pay | Admitting: Internal Medicine

## 2018-09-18 ENCOUNTER — Telehealth: Payer: Self-pay | Admitting: *Deleted

## 2018-09-18 NOTE — Telephone Encounter (Signed)
Open error 

## 2018-09-18 NOTE — Telephone Encounter (Addendum)
Call to [employer] for PA for Dexcom  [employer].  Information was given awaiting fax from [facility] with further information and decision.  Sander Nephew, RN 09/18/2018 2:00 PM. Call to [facility] to see if claim for the Dexcom G6 could be run under patient's Medicare Part B as it will be considered Medical equipment.  Medicare Part D denied the claim.  Call to [phone] Medicare representative  stated that the claim will come from the pharmacy who will request records from the doctor and then the determination will be made with the patient being informed and then the pharmacy will notify the patient to come to pick up the equipment.  Nothing at this point can come from the provider until records are requested for review from the pharmacy to be sent to Medicare. Sander Nephew, RN 09/20/2018 4;24 PM.

## 2018-09-18 NOTE — Telephone Encounter (Signed)
-----   Message from [name], MD sent at 09/13/2018  5:25 PM EDT ----- Jane [name] news with stress test: No evidence of any existing significant blockages.  No significant evidence of prior heart attack.  Normal pump function.  LOW RISK study.  It seems like everything is okay with the previous stents.  Does not look like we need to do more catheterization.  This would also so you are fine going forward with surgery on the other foot. Would not do any further cardiac evaluation.  Okay to hold aspirin for the procedure.  Would be intermediate risk patient for low risk surgery.  Glenetta Hew, MD

## 2018-09-18 NOTE — Telephone Encounter (Signed)
Left message to call back -- in regards to test results 

## 2018-09-18 NOTE — Telephone Encounter (Signed)
The patient has been notified of the result and verbalized understanding.  All questions (if any) were answered. Raiford Simmonds, RN 09/18/2018 12:00 PM

## 2018-09-20 NOTE — Telephone Encounter (Signed)
Received call from [facility] Rx-PA completed over the phone and sent for review.[name]11/7/20193:04 PM

## 2018-09-25 NOTE — Telephone Encounter (Signed)
Called Jane [name] to follow up on Continuous glucose monitor. She has not heard back from [facility]. She agreed to call them again to follow up.

## 2018-09-27 ENCOUNTER — Telehealth: Payer: Self-pay | Admitting: Cardiology

## 2018-09-27 NOTE — Telephone Encounter (Signed)
Returned call to patient.She stated her kidney Dr.stopped Cartia this morning due to B/P dropping low.She wanted to make sure Dr.Harding was in agreement.Advised I will send message to [place].

## 2018-09-27 NOTE — Telephone Encounter (Signed)
New Message:      Pt wanted Dr [name] to know that her kidney doctor took her off of Cartia XT. She wants to know if Dr [name] wants her to take something in the place of the [place] XT?

## 2018-10-01 NOTE — Telephone Encounter (Signed)
I guess that makes sense.  I would like it if they could think of another medicine to hold as opposed to that one.  Glenetta Hew, MD

## 2018-10-01 NOTE — Telephone Encounter (Signed)
Spoke to patient. Information given. Patient states she will ask Dr Jimmy Footman( [place])  IF THERE IS ANOTHER MEDICATION AND WILL CONTACT OFFICE.

## 2018-10-04 NOTE — Telephone Encounter (Signed)
PATIENT STATES DR [employer]  CARDIOLOGIST TO CONTACT HIM TO DISCUSS - PATIENT'S CARE.  INFORMED PATIENT WILL  NOTIFIED DR HARDING.

## 2018-10-05 NOTE — Telephone Encounter (Signed)
Probably not to be on a call him today, can probably talked on Monday which is my short day.  Glenetta Hew, MD

## 2018-10-13 DIAGNOSIS — R197 Diarrhea, unspecified: Secondary | ICD-10-CM | POA: Insufficient documentation

## 2018-10-15 ENCOUNTER — Ambulatory Visit (INDEPENDENT_AMBULATORY_CARE_PROVIDER_SITE_OTHER): Payer: Medicare Other | Admitting: Obstetrics & Gynecology

## 2018-10-15 ENCOUNTER — Other Ambulatory Visit: Payer: Self-pay | Admitting: Obstetrics & Gynecology

## 2018-10-15 ENCOUNTER — Encounter: Payer: Self-pay | Admitting: Obstetrics & Gynecology

## 2018-10-15 VITALS — BP 164/99 | HR 96 | Wt 234.0 lb

## 2018-10-15 DIAGNOSIS — Z124 Encounter for screening for malignant neoplasm of cervix: Secondary | ICD-10-CM

## 2018-10-15 DIAGNOSIS — Z1231 Encounter for screening mammogram for malignant neoplasm of breast: Secondary | ICD-10-CM

## 2018-10-15 DIAGNOSIS — Z01419 Encounter for gynecological examination (general) (routine) without abnormal findings: Secondary | ICD-10-CM

## 2018-10-15 DIAGNOSIS — N951 Menopausal and female climacteric states: Secondary | ICD-10-CM

## 2018-10-15 DIAGNOSIS — Z1239 Encounter for other screening for malignant neoplasm of breast: Secondary | ICD-10-CM

## 2018-10-15 MED ORDER — VENLAFAXINE HCL ER 75 MG PO CP24: 75.0000 mg | ORAL_CAPSULE | Freq: Every day | ORAL | 3 refills | Status: DC

## 2018-10-15 MED FILL — CLOPIDOGREL 75 MG TABLET: 75 | 30 days supply | Qty: 30 | Fill #6

## 2018-10-15 MED FILL — VENLAFAXINE HCL ER 75 MG CA: 75 | 30 days supply | Qty: 30 | Fill #0

## 2018-10-15 MED FILL — NOVOLOG MIX 70-30 FLEXPEN S: (70-30) 100 | 63 days supply | Qty: 15 | Fill #0

## 2018-10-15 NOTE — Progress Notes (Signed)
maSubjective:     Jane [name] is a 42 y.o. female here for a routine exam.  Current complaints: pt reports hot flushes. She was told to take Black cohosh which she was told by her primary care provider not to take due to potential injury to her kidneys.      Gynecologic History No LMP recorded. (Menstrual status: Perimenopausal). Contraception: tubal ligation Last Pap: 08/29/2017. Results were: normal Last mammogram: 07/11/2017. Results were: normal  Obstetric History OB History  Gravida Para Term Preterm AB Living  2 2 1 1  0 2  SAB TAB Ectopic Multiple Live Births  0 0 0 0      # Outcome Date GA Lbr Len/2nd Weight Sex Delivery Anes PTL Lv  2 Preterm           1 Term            The following portions of the patient's history were reviewed and updated as appropriate: allergies, current medications, past family history, past medical history, past social history, past surgical history and problem list.  Review of Systems Pertinent items are noted in HPI.    Objective:  BP (!) 164/99   Pulse 96   Wt 234 lb (106.1 kg)   BMI 38.94 kg/m  General Appearance:    Alert, cooperative, no distress, appears stated age  Head:    Normocephalic, without obvious abnormality, atraumatic  Eyes:    conjunctiva/corneas clear, EOM's intact, both eyes  Ears:    Normal external ear canals, both ears  Nose:   Nares normal, septum midline, mucosa normal, no drainage    or sinus tenderness  Throat:   Lips, mucosa, and tongue normal; teeth and gums normal  Neck:   Supple, symmetrical, trachea midline, no adenopathy;    thyroid:  no enlargement/tenderness/nodules  Back:     Symmetric, no curvature, ROM normal, no CVA tenderness  Lungs:     Clear to auscultation bilaterally, respirations unlabored  Chest Wall:    No tenderness or deformity   Heart:    Regular rate and rhythm, S1 and S2 normal, no murmur, rub   or gallop  Breast Exam:    No tenderness, masses, or nipple abnormality  Abdomen:      Soft, non-tender, bowel sounds active all four quadrants,    no masses, no organomegaly  Genitalia:    Normal female without lesion, discharge or tenderness; uterus: small and mobile; no adnexal masses.      Extremities:   Extremities normal, atraumatic, no cyanosis or edema  Pulses:   2+ and symmetric all extremities  Skin:   Skin color, texture, turgor normal, no rashes or lesions     Assessment:    Healthy female exam.   Hot flushes- discussed with pt tx options. Pt on Zoloft. Pt will switch from zoloft to Effexor to attempt management of her hot flushes. Pt was placed on Gabapentin prev but, she did not take the meds.       Plan:    Follow up in: 1 year.    Screening mammogram PAP needed in 2020 D/C Zoloft Effexor 75 mg XL daily F/u in 6 weeks   Atom Solivan L. Harraway-Smith, M.D., [name]

## 2018-10-15 NOTE — Progress Notes (Signed)
Pt declines [facility] Clinician due to her being managed by a therapist.

## 2018-11-12 ENCOUNTER — Ambulatory Visit: Payer: Self-pay | Admitting: Obstetrics & Gynecology

## 2018-11-21 ENCOUNTER — Telehealth: Payer: Self-pay | Admitting: General Practice

## 2018-11-21 ENCOUNTER — Other Ambulatory Visit: Payer: Self-pay | Admitting: Obstetrics & Gynecology

## 2018-11-21 DIAGNOSIS — N939 Abnormal uterine and vaginal bleeding, unspecified: Secondary | ICD-10-CM

## 2018-11-21 MED ORDER — MEGESTROL ACETATE 40 MG PO TABS: 40.0000 mg | ORAL_TABLET | Freq: Every day | ORAL | 1 refills | Status: DC

## 2018-11-21 NOTE — Telephone Encounter (Signed)
Called patient & informed her of medication refill. Patient verbalized understanding and will follow up on 1/13 for appt.

## 2018-11-21 NOTE — Telephone Encounter (Signed)
Patient called into front office stating she is out of her megace pills and has been for a couple days now. Patient is requesting a refill. Told patient Dr Ihor Dow is not in the office today but I would send her a message and let her know. Told patient I will reach back out to her whenever I hear something. Patient verbalized understanding & had no questions at this time.

## 2018-11-23 DIAGNOSIS — Z4802 Encounter for removal of sutures: Secondary | ICD-10-CM | POA: Insufficient documentation

## 2018-11-26 ENCOUNTER — Ambulatory Visit (INDEPENDENT_AMBULATORY_CARE_PROVIDER_SITE_OTHER): Payer: Medicare Other | Admitting: Obstetrics & Gynecology

## 2018-11-26 ENCOUNTER — Ambulatory Visit
Admission: RE | Admit: 2018-11-26 | Discharge: 2018-11-26 | Disposition: A | Discharge status: Home / Self Care | Payer: Medicare Other | Source: Ambulatory Visit | Attending: Obstetrics & Gynecology | Admitting: Obstetrics & Gynecology

## 2018-11-26 ENCOUNTER — Encounter: Payer: Self-pay | Admitting: Obstetrics & Gynecology

## 2018-11-26 VITALS — BP 168/86 | HR 106 | Ht 65.0 in | Wt 232.9 lb

## 2018-11-26 DIAGNOSIS — N951 Menopausal and female climacteric states: Secondary | ICD-10-CM

## 2018-11-26 DIAGNOSIS — Z1231 Encounter for screening mammogram for malignant neoplasm of breast: Secondary | ICD-10-CM

## 2018-11-26 DIAGNOSIS — N939 Abnormal uterine and vaginal bleeding, unspecified: Secondary | ICD-10-CM | POA: Diagnosis not present

## 2018-11-26 DIAGNOSIS — F320 Major depressive disorder, single episode, mild: Secondary | ICD-10-CM

## 2018-11-26 MED ORDER — MEGESTROL ACETATE 40 MG PO TABS: 40.0000 mg | ORAL_TABLET | Freq: Every day | ORAL | 6 refills | Status: DC

## 2018-11-26 MED ORDER — VENLAFAXINE HCL ER 150 MG PO CP24: 150.0000 mg | ORAL_CAPSULE | Freq: Every day | ORAL | 3 refills | Status: DC

## 2018-11-26 NOTE — Progress Notes (Signed)
History:  43 y.o. [id number] here today for f/u of hot flushes. Pt was placed on Effexor in Dec 2019.  Hot flushes are better. Depression is better. She is also seeing a therapist. 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  Pt was on Megace and had no bleeding. Since she has been off the Megace she ~ 1 month ago she reports daily bleeding. Pt wants [place] first. Pt denies weight loss. She abd pain for 1 week that has resolved.    The following portions of the patient's history were reviewed and updated as appropriate: allergies, current medications, past family history, past medical history, past social history, past surgical history and problem list.  Review of Systems:  Pertinent items are noted in HPI.    Objective:  Physical Exam Height 5\' 5"  (1.651 m), weight 232 lb 14.4 oz (105.6 kg).  CONSTITUTIONAL: Well-developed, well-nourished female in no acute distress.  HENT:  Normocephalic, atraumatic EYES: Conjunctivae and EOM are normal. No scleral icterus.  NECK: Normal range of motion SKIN: Skin is warm and dry. No rash noted. Not diaphoretic.No pallor. [place]: Alert and oriented to person, place, and time. Normal coordination.   Abd: Soft, nontender and nondistended Pelvic: Normal appearing external genitalia; normal appearing vaginal mucosa and cervix.  Normal discharge.  Small uterus, no other palpable masses, no uterine or adnexal tenderness  Labs and Imaging No results found.  Assessment & Plan:  Hot flushes and depression  Improved on Effexor but, still very miserable  Increase Effexor to 150mg  daily   AUB-  improved on Megace  F/u pelvic US  If cont bleeding or thickened endometrial stripe- rec Endo bx.       Evelyn Moch L. Harraway-Smith, M.D., [name]

## 2018-11-28 ENCOUNTER — Ambulatory Visit: Payer: Self-pay

## 2018-11-28 ENCOUNTER — Other Ambulatory Visit: Payer: Self-pay | Admitting: Obstetrics & Gynecology

## 2018-11-28 ENCOUNTER — Other Ambulatory Visit: Payer: Self-pay | Admitting: Dietician

## 2018-11-28 ENCOUNTER — Telehealth: Payer: Self-pay

## 2018-11-28 DIAGNOSIS — N939 Abnormal uterine and vaginal bleeding, unspecified: Secondary | ICD-10-CM

## 2018-11-28 MED ORDER — MEGESTROL ACETATE 40 MG PO TABS: 40.0000 mg | ORAL_TABLET | Freq: Every day | ORAL | 6 refills | Status: DC

## 2018-11-28 NOTE — Telephone Encounter (Signed)
Refill Authorization faxed from [place] for Megestrol AC 40MG  TAB. Sending message to Dr. Donny Pique to advise if appropriate.

## 2018-11-28 NOTE — Telephone Encounter (Signed)
Thanks [name]. Let me know when the paperwork needs to be filled out!  Jane [name]

## 2018-11-28 NOTE — Telephone Encounter (Signed)
Per Jonathon (Local dispensing) at [facility] the Dexcom  rxs were ready and patient notified mid-December but [facility] did not pick them up. It was covered, ready,not picked up, have to redo paperwork so it will take them a while to redo.  Cost ~90$ for $44.16, transmitter- free receiver-  $46.24, unsure why Medicaid did not pick up copays. Spoke with jonathan about this:he will call back.

## 2018-11-29 ENCOUNTER — Ambulatory Visit (INDEPENDENT_AMBULATORY_CARE_PROVIDER_SITE_OTHER): Payer: Medicare Other | Admitting: Internal Medicine

## 2018-11-29 ENCOUNTER — Ambulatory Visit (HOSPITAL_COMMUNITY)
Admission: RE | Admit: 2018-11-29 | Discharge: 2018-11-29 | Disposition: A | Discharge status: Home / Self Care | Payer: Medicare Other | Source: Ambulatory Visit | Attending: Obstetrics & Gynecology | Admitting: Obstetrics & Gynecology

## 2018-11-29 ENCOUNTER — Other Ambulatory Visit: Payer: Self-pay

## 2018-11-29 VITALS — BP 138/64 | HR 106 | Temp 98.3°F | Ht 65.0 in | Wt 229.3 lb

## 2018-11-29 DIAGNOSIS — Z87891 Personal history of nicotine dependence: Secondary | ICD-10-CM

## 2018-11-29 DIAGNOSIS — E113493 Type 2 diabetes mellitus with severe nonproliferative diabetic retinopathy without macular edema, bilateral: Secondary | ICD-10-CM | POA: Diagnosis present

## 2018-11-29 DIAGNOSIS — D509 Iron deficiency anemia, unspecified: Secondary | ICD-10-CM

## 2018-11-29 DIAGNOSIS — E11610 Type 2 diabetes mellitus with diabetic neuropathic arthropathy: Secondary | ICD-10-CM

## 2018-11-29 DIAGNOSIS — J45909 Unspecified asthma, uncomplicated: Secondary | ICD-10-CM

## 2018-11-29 DIAGNOSIS — R110 Nausea: Secondary | ICD-10-CM

## 2018-11-29 DIAGNOSIS — N951 Menopausal and female climacteric states: Secondary | ICD-10-CM | POA: Diagnosis present

## 2018-11-29 DIAGNOSIS — F320 Major depressive disorder, single episode, mild: Secondary | ICD-10-CM

## 2018-11-29 DIAGNOSIS — Z7982 Long term (current) use of aspirin: Secondary | ICD-10-CM

## 2018-11-29 DIAGNOSIS — F339 Major depressive disorder, recurrent, unspecified: Secondary | ICD-10-CM

## 2018-11-29 DIAGNOSIS — N939 Abnormal uterine and vaginal bleeding, unspecified: Secondary | ICD-10-CM | POA: Diagnosis present

## 2018-11-29 DIAGNOSIS — N186 End stage renal disease: Secondary | ICD-10-CM

## 2018-11-29 DIAGNOSIS — Z9114 Patient's other noncompliance with medication regimen: Secondary | ICD-10-CM | POA: Diagnosis not present

## 2018-11-29 DIAGNOSIS — I2510 Atherosclerotic heart disease of native coronary artery without angina pectoris: Secondary | ICD-10-CM

## 2018-11-29 DIAGNOSIS — E1140 Type 2 diabetes mellitus with diabetic neuropathy, unspecified: Secondary | ICD-10-CM

## 2018-11-29 DIAGNOSIS — Z992 Dependence on renal dialysis: Secondary | ICD-10-CM | POA: Diagnosis not present

## 2018-11-29 DIAGNOSIS — F322 Major depressive disorder, single episode, severe without psychotic features: Secondary | ICD-10-CM

## 2018-11-29 DIAGNOSIS — Z794 Long term (current) use of insulin: Secondary | ICD-10-CM | POA: Diagnosis not present

## 2018-11-29 DIAGNOSIS — E1122 Type 2 diabetes mellitus with diabetic chronic kidney disease: Secondary | ICD-10-CM

## 2018-11-29 LAB — POCT GLYCOSYLATED HEMOGLOBIN (HGB A1C): Hemoglobin A1C: 8.2 % — AB (ref 4.0–5.6)

## 2018-11-29 LAB — BASIC METABOLIC PANEL
Anion gap: 17 — ABNORMAL HIGH (ref 5–15)
BUN: 54 mg/dL — ABNORMAL HIGH (ref 6–20)
CHLORIDE: 92 mmol/L — AB (ref 98–111)
CO2: 26 mmol/L (ref 22–32)
Calcium: 8.8 mg/dL — ABNORMAL LOW (ref 8.9–10.3)
Creatinine, Ser: 11.87 mg/dL — ABNORMAL HIGH (ref 0.44–1.00)
GFR calc Af Amer: 4 mL/min — ABNORMAL LOW (ref >60)
GFR calc non Af Amer: 3 mL/min — ABNORMAL LOW (ref >60)
GLUCOSE: 301 mg/dL — AB (ref 70–99)
Potassium: 4.5 mmol/L (ref 3.5–5.1)
Sodium: 135 mmol/L (ref 135–145)

## 2018-11-29 LAB — GLUCOSE, CAPILLARY: Glucose-Capillary: 284 mg/dL — ABNORMAL HIGH (ref 70–99)

## 2018-11-29 MED ORDER — INSULIN PEN NEEDLE 32G X 4 MM MISC: 3 refills | Status: DC

## 2018-11-29 MED ORDER — PROMETHAZINE HCL 12.5 MG PO TABS: 12.5000 mg | ORAL_TABLET | Freq: Four times a day (QID) | ORAL | 0 refills | Status: DC | PRN

## 2018-11-29 MED ORDER — INSULIN GLARGINE 100 UNIT/ML ~~LOC~~ SOLN: 24.0000 [IU] | Freq: Every day | SUBCUTANEOUS | 2 refills | Status: DC

## 2018-11-29 NOTE — Patient Instructions (Signed)
Thank you for allowing [place] to provide your care today. Today we discussed your nausea.    I have ordered bmp, a1c labs for you. I will call if any are abnormal.    Here are the changes to your medication: - I have refilled your insulin pens and needles - Please start phenergan 12.5mg  Po as needed for nausea  Please follow-up in 3 months with your PCP.    Should you have any questions or concerns please call the internal medicine clinic at [phone].

## 2018-11-29 NOTE — Progress Notes (Signed)
CC: Nausea  HPI: [name] is a 43 y.o. F w/ PMH of ESRD, CAD, T2DM, Diabetic neuropathy, Iron deficiency anemia, and asthma presenting with complaints of nausea. She states this is a chronic issue that began at the end of October. She states the nausea is primarily associated with her dialysis sessions and states she had been meaning to mention it to her nephrologist but had not done so. She also had visited ED in the past for similar issue and was worked up for ACS due to concomitant chest pain which was negative. She states she had tried zofran in the past that had improved nausea but has not had any medications. Upon further questioning, she also mentions that she had been out of her insulin pen needles for the last month and had not been taking any of her insulin. She mentions that her sugars have been around high 200s to 300s. She denies any fever, chills, vomiting, abdominal pain, diarrhea, constipation, chest pain, palpitations, dyspnea, or headaches.   Past Medical History:  Diagnosis Date  . Asthma    prn inhaler  . CAD S/P BMS PCI to prox LAD 03/31/2016   Ost LAD to Mid LAD lesion, 90% stenosed. Post intervention - Vision BMS 3.0 mm x 18 mm (~3.5 mm) there is a 0% residual stenosis.   . Charcot foot due to diabetes mellitus ([place]) 11/2016   left 2018; right 2019  . Depression   . Diabetic neuropathy (HCC)    feet  . ESRD (end stage renal disease) on dialysis ([place]) 12/2017   ?chronic interstitial nephritis  . H/O non-ST elevation myocardial infarction (NSTEMI) 03/2016   Found in 90% mid LAD lesion treated with bare-metal stent (BMS) PCI - vision BMS 3.0 mm x 18 mm  . History of MRSA infection   . Hyperlipidemia   . Hypertension    medication dose increased 11/22/2015; has been taking med. consistently since 03/2016, per pt.  . Insulin dependent diabetes mellitus ([address])   . Iron deficiency anemia    takes iron supplement  . Retinopathy of both eyes   . Sickle cell  trait ([place])   . 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 Negative for chills and fever.  Eyes: Negative for blurred vision.  Respiratory: Negative for sputum production and shortness of breath.   Cardiovascular: Positive for leg swelling. Negative for chest pain and palpitations.  Gastrointestinal: Positive for nausea. Negative for abdominal pain, diarrhea, heartburn and vomiting.  Neurological: Positive for sensory change. Negative for dizziness, focal weakness, weakness and headaches.     Physical Exam: Vitals:   11/29/18 1047  BP: 138/64  Pulse: (!) 106  Temp: 98.3 F (36.8 C)  TempSrc: Oral  SpO2: 100%  Weight: 229 lb 4.8 oz (104 kg)  Height: 5\' 5"  (1.651 m)    Physical Exam  Constitutional: She is oriented to person, place, and time. She appears well-developed and well-nourished. No distress.  HENT:  Head: Atraumatic.  Mouth/Throat: Oropharynx is clear and moist.  Eyes: Conjunctivae are normal. No scleral icterus.  Neck: Normal range of motion. Neck supple.  Cardiovascular: Normal rate, regular rhythm, normal heart sounds and intact distal pulses.  No murmur heard. Right arm fistula w/ palpable bruit  Respiratory: Effort normal and breath sounds normal. She has no wheezes. She has no rales.  GI: Soft. Bowel sounds are normal. There is no abdominal tenderness.  Musculoskeletal: Normal range of  motion.        General: Tenderness and deformity (bilateral charcot's feet) present.  Neurological: She is alert and oriented to person, place, and time. No cranial nerve deficit.  Diminished sensation on plantar surface of feet bilaterally  Skin: Skin is warm and dry.     Assessment & Plan:   Major depressive disorder Depression screen [facility] 2/9 11/29/2018 10/15/2018 08/31/2018  Decreased Interest 1 1 3   Down, Depressed, Hopeless 1 1 3   PHQ - 2 Score 2 2 6   Altered sleeping 3 3 3   Tired,  decreased energy 1 3 3   Change in appetite 3 3 3   Feeling bad or failure about yourself  2 2 3   Trouble concentrating 3 3 3   Moving slowly or fidgety/restless 1 1 2   Suicidal thoughts 0 0 0  PHQ-9 Score 15 17 23   Difficult doing work/chores Somewhat difficult - Extremely dIfficult  Some recent data might be hidden   Discussed patient's recent mood in regards to difficulty picking up her medication supplies. Mentions that she had been overwhelmed by the severity of her health conditions and was feeling depressed but she had started talking to a therapist in November and her mood is significantly improved. PHQ-9 scores also show improvement from 23 in October to 15 at this visit.   - Recommend continuing her visits with therapist.  Nausea Presents with 3 month duration nausea. Associated with dialysis session. Improved on Zofran. Denies any vomiting or abdominal pain. Most likely due to volume fluctuations with dialysis. Concerning for HHS vs DKA in setting of stopping insulin use to running out of pens. Blood glucose checked in clinic was 284. Will get BMP but low suspicion for nausea due to acidemia at that bg level. Chart review shows she had hx of QT prolongation on Zofran. Will treat with other anti-emetic.  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 Unable to bring her glucometer today but states she has not been taking her insulin aspart for the last month due to not having her pen needles. Found out her pen needle prescription was sent to [facility] due to cost issues but she forgot to pick it up there as she uses [facility].   - Counseled on importance of sticking to prescribed insulin regimen: (Lantus  24 units qhs, Novolog 70/30 mix 3-9 units TID based on carb count) - Refilled pen-needle prescription    Patient discussed with Dr. Dareen Piano   -Jane [name], PGY1

## 2018-11-30 ENCOUNTER — Telehealth: Payer: Self-pay | Admitting: Internal Medicine

## 2018-11-30 ENCOUNTER — Encounter: Payer: Self-pay | Admitting: Internal Medicine

## 2018-11-30 DIAGNOSIS — R110 Nausea: Secondary | ICD-10-CM | POA: Insufficient documentation

## 2018-11-30 MED ORDER — INSULIN ASPART 100 UNIT/ML FLEXPEN: PEN_INJECTOR | SUBCUTANEOUS | 1 refills | Status: DC

## 2018-11-30 NOTE — Telephone Encounter (Signed)
Spoke with patient, she cannot afford 90$. Jonathon at [facility] confirmed they cannot bill [place] Medicaid. Spoke with [employer], he says he can help by working with other DME companies. Faxed him the information he requested.

## 2018-11-30 NOTE — Assessment & Plan Note (Signed)
Depression screen [facility] 2/9 11/29/2018 10/15/2018 08/31/2018  Decreased Interest 1 1 3   Down, Depressed, Hopeless 1 1 3   PHQ - 2 Score 2 2 6   Altered sleeping 3 3 3   Tired, decreased energy 1 3 3   Change in appetite 3 3 3   Feeling bad or failure about yourself  2 2 3   Trouble concentrating 3 3 3   Moving slowly or fidgety/restless 1 1 2   Suicidal thoughts 0 0 0  PHQ-9 Score 15 17 23   Difficult doing work/chores Somewhat difficult - Extremely dIfficult  Some recent data might be hidden   Discussed patient's recent mood in regards to difficulty picking up her medication supplies. Mentions that she had been overwhelmed by the severity of her health conditions and was feeling depressed but she had started talking to a therapist in November and her mood is significantly improved. PHQ-9 scores also show improvement from 23 in October to 15 at this visit.   - Recommend continuing her visits with therapist.

## 2018-11-30 NOTE — Assessment & Plan Note (Signed)
Presents with 3 month duration nausea. Associated with dialysis session. Improved on Zofran. Denies any vomiting or abdominal pain. Most likely due to volume fluctuations with dialysis. Concerning for HHS vs DKA in setting of stopping insulin use to running out of pens. Blood glucose checked in clinic was 284. Will get BMP but low suspicion for nausea due to acidemia at that bg level. Chart review shows she had hx of QT prolongation on Zofran. Will treat with other anti-emetic.  - promethazine 12.5mg  q6hr Prn for nausea - BMP today

## 2018-11-30 NOTE — Telephone Encounter (Signed)
DM educator noted novolog mix insulin on patient chart review. Patient is supposed to be on Lantus and just novolog (aspart insulin). I called patient; she states she has not been taking novolog mix. She agreed to pick up new prescription for novolog with instructions to use 4-9 units three times a day based on her carb intake with max of 24 units per day as previously discussed at prior visits. She agrees to this and states understanding.  Alphonzo Grieve, MD

## 2018-11-30 NOTE — Telephone Encounter (Signed)
Spoke with Jane [name] about the status of the CGM order. She states she now has pens needles and takes Novolog insulin not Novolog Mix 70/30.

## 2018-11-30 NOTE — Assessment & Plan Note (Signed)
Lab Results  Component Value Date   HGBA1C 8.2 (A) 11/29/2018   Hgb a1c at 8.2 improved from 9.1 last visit but unreliable as she has ESRD. Unable to bring her glucometer today but states she has not been taking her insulin aspart for the last month due to not having her pen needles. Found out her pen needle prescription was sent to [place] due to cost issues but she forgot to pick it up there as she uses [facility].   - Counseled on importance of sticking to prescribed insulin regimen: (Lantus 24 units qhs, Novolog 70/30 mix 3-9 units TID based on carb count) - Refilled pen-needle prescription

## 2018-12-03 NOTE — Progress Notes (Signed)
Internal Medicine Clinic Attending ° °Case discussed with Dr. Lee at the time of the visit.  We reviewed the resident’s history and exam and pertinent patient test results.  I agree with the assessment, diagnosis, and plan of care documented in the resident’s note.  °

## 2018-12-11 NOTE — Telephone Encounter (Signed)
Roderic Palau from [facility] called to tell us that [employer] is not accepting Dr. Winfred Burn signature because she is not a [employer] provider. He will ask [facility] to refax the papers so that we can have a faculty doctor sign the forms.

## 2018-12-17 MED FILL — UNIFINE PENTIPS 32GX5/32: 32G X 4 MM | 90 days supply | Qty: 300 | Fill #0 | Status: TO

## 2018-12-17 MED FILL — LANTUS 100 UNITS/ML VIAL: 100 | 28 days supply | Qty: 10 | Fill #0 | Status: TO

## 2018-12-17 MED FILL — VENLAFAXINE HCL ER 150 MG C: 150 | 30 days supply | Qty: 30 | Fill #0

## 2018-12-17 MED FILL — UNIFINE PENTIPS 32GX5/32": 32G X 4 MM | 90 days supply | Qty: 300 | Fill #0 | Status: TO

## 2018-12-17 MED FILL — PROMETHAZINE 12.5 MG TABLET: 12.5 | 7 days supply | Qty: 30 | Fill #0

## 2018-12-17 MED FILL — ATORVASTATIN 40 MG TABLET: 40 | 90 days supply | Qty: 90 | Fill #1 | Status: TO

## 2018-12-17 MED FILL — NOVOLOG FLEXPEN SYRINGE: 100 | 63 days supply | Qty: 15 | Fill #0

## 2018-12-17 MED FILL — MEGESTROL 40 MG TABLET: 40 | 30 days supply | Qty: 30 | Fill #0

## 2018-12-18 ENCOUNTER — Encounter: Payer: Self-pay | Admitting: Cardiology

## 2018-12-18 ENCOUNTER — Ambulatory Visit (INDEPENDENT_AMBULATORY_CARE_PROVIDER_SITE_OTHER): Payer: Medicare Other | Admitting: Cardiology

## 2018-12-18 VITALS — BP 112/72 | HR 89 | Ht 65.0 in | Wt 225.0 lb

## 2018-12-18 DIAGNOSIS — Z9861 Coronary angioplasty status: Secondary | ICD-10-CM | POA: Diagnosis not present

## 2018-12-18 DIAGNOSIS — I779 Disorder of arteries and arterioles, unspecified: Secondary | ICD-10-CM

## 2018-12-18 DIAGNOSIS — I10 Essential (primary) hypertension: Secondary | ICD-10-CM

## 2018-12-18 DIAGNOSIS — E785 Hyperlipidemia, unspecified: Secondary | ICD-10-CM

## 2018-12-18 DIAGNOSIS — E1169 Type 2 diabetes mellitus with other specified complication: Secondary | ICD-10-CM | POA: Diagnosis not present

## 2018-12-18 DIAGNOSIS — I2510 Atherosclerotic heart disease of native coronary artery without angina pectoris: Secondary | ICD-10-CM | POA: Diagnosis not present

## 2018-12-18 NOTE — Progress Notes (Signed)
PCP: Alphonzo Grieve, MD  Clinic Note: Chief Complaint  Patient presents with  . Follow-up    No true CV complaints  . Coronary Artery Disease    BMS PCI to LAD.    HPI: XOEY WARMOTH is a 43 y.o. 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  She now presents today for 78-monthfollow-up..  In addition to her severe diabetes that is reported controlled, she has now developed nephrotic range proteinuria and end-stage renal disease on dialysis as of February 2019.  AJAQUESHA BOROFFwas last seen in Oct 2019 - pre-op for right foot Sgx.  She still noted it off and on intermittent chest discomfort symptoms that were difficult to understand.  Not necessarily associated with exertion.  Having difficulty walking because of her Charcot foot.  Had low pleasures in dialysis and therefore medications were discontinued.  Also had Plavix stopped by her nephrologist.  Was converted from beta-blocker to diltiazem. -->Myoview ordered.   Recent Hospitalizations:   None  Studies Personally Reviewed - (if available, images/films reviewed: From Epic Chart or Care Everywhere)  Myoview 09/02/2018:  LOW RISK. EF 55-65%. No ischemia or Infarction  Interval History: ASundusreturns here today for follow-up actually doing pretty well from a cardiac standpoint.  She did not have surgery on her right foot and they are treating her expectantly with a boot.  They are hoping to avoid surgery.  She is able to walk now using the boot and denies any recurrent chest pain or pressure with rest or exertion.  She does note however that when she is in dialysis she feels her heart rate go up quite a bit and she often has nausea vomiting after dialysis.  But she says that  since being on dialysis she really does not have any real PND orthopnea.  No real edema.  Although she feels her heart rate going up during and after dialysis, she denies any irregular heartbeats or syncope/near syncope.  No TIA or emesis fugax symptoms. She does not walk enough to truly to know if there is any claudication symptoms.  She says that a few months ago her diltiazem was also stopped and now she is not on any blood pressure medications.  She is only really on aspirin and statin from a cardiac standpoint.  ROS: A comprehensive was performed. Review of Systems  Constitutional: Negative for malaise/fatigue (Does not have a lot of baseline energy, but feels better than she did last year.  Very fatigued after dialysis) and weight loss.  HENT: Negative for nosebleeds.   Respiratory: Negative for cough, shortness of breath and wheezing.   Gastrointestinal: Positive for heartburn. Negative for blood in stool and melena.  Genitourinary: Negative for hematuria.  Musculoskeletal: Positive for joint pain (Right foot/ankle). Negative for falls (She occasionally loses her balance but has not had a true fall).  Neurological: Positive for dizziness (After dialysis) and tingling (Hands and feet from neuropathy).  Psychiatric/Behavioral: Positive for depression (Seems to be kept in check with Effexor). Negative for memory loss. The patient does not have insomnia.   All other systems reviewed and are negative.  I have reviewed and (if needed) personally updated the patient's problem list, medications, allergies, past medical and surgical  history, social and family history.   Past Medical History:  Diagnosis Date  . Asthma    prn inhaler  . CAD S/P BMS PCI to prox LAD 03/31/2016   Ost LAD to Mid LAD lesion, 90% stenosed. Post intervention - Vision BMS 3.0 mm x 18 mm (~3.5 mm) there is a 0% residual stenosis.   . Charcot foot due to diabetes mellitus ([place]) 11/2016   left 2018; right 2019  .  Depression   . Diabetic neuropathy (HCC)    feet  . ESRD (end stage renal disease) on dialysis ([place]) 12/2017   ?chronic interstitial nephritis  . H/O non-ST elevation myocardial infarction (NSTEMI) 03/2016   Found in 90% mid LAD lesion treated with bare-metal stent (BMS) PCI - vision BMS 3.0 mm x 18 mm  . History of MRSA infection   . Hyperlipidemia   . Hypertension    medication dose increased 11/22/2015; has been taking med. consistently since 03/2016, per pt.  . Insulin dependent diabetes mellitus ([place])   . Iron deficiency anemia    takes iron supplement  . Retinopathy of both eyes   . Sickle cell trait ([place])   . 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 ACHILLES TENDON LENGTHENING Left 11/24/2016   Procedure: Left Achilles tendon lengthening (open);  Surgeon: Wylene Simmer, MD;  Location: [facility];  Service: Orthopedics;  Laterality: Left;  . AV FISTULA PLACEMENT Right 07/31/2017   Procedure: ARTERIOVENOUS BRACHIOCEPHALIC FISTULA CREATION RIGHT ARM;  Surgeon: Conrad [place], MD;  Location: [facility];  Service: Vascular;  Laterality: Right;  . CALCANEAL OSTEOTOMY Left 11/24/2016   Procedure: Left hindfoot osteotomy and fusion;  Surgeon: Wylene Simmer, MD;  Location: [facility];  Service: Orthopedics;  Laterality: Left;  . CARDIAC CATHETERIZATION N/A 03/31/2016   Procedure: Left Heart Cath and Coronary Angiography;  Surgeon: Lorretta Harp, MD;  Location: [facility] INVASIVE CV LAB: 90% early mLAD, normal LV Fxn  . CARDIAC CATHETERIZATION N/A 03/31/2016   Procedure: Coronary Stent Intervention;  Surgeon: Lorretta Harp, MD;  Location: [facility] CV LAB;  Service: Cardiovascular: PCI to mLAD BMS Vision 3.0 mm x 18 mm  . CESAREAN SECTION  1997  . CORONARY ANGIOPLASTY    . FISTULA SUPERFICIALIZATION Right 11/15/2017   Procedure: FISTULA SUPERFICIALIZATION RIGHT BRACHIOCEPHALIC  RIGHT;  Surgeon: Conrad [place], MD;   Location: [facility];  Service: Vascular;  Laterality: Right;  . NM MYOVIEW LTD  08/2018   LOW RISK. EF 55-65%. No ischemia or Infarction  . TRANSTHORACIC ECHOCARDIOGRAM  03/2016   Normal LV size and thickness. EF 60-65%. GR 1 DD. Otherwise essentially normal.  . TUBAL LIGATION  1997    Cardiac catheterization with PCI May 2017:90% mLAD; nl LV Fxn--> PCI - Vision BMS3.41mx 18 mm    2D Echo 03/2016: Normal LV size and thickness. EF 60-65%. GR 1 DD. Otherwise essentially normal.   Current Meds  Medication Sig  . ACCU-CHEK FASTCLIX LANCETS MISC Check blood sugar 4x/day  . acetaminophen (TYLENOL) 500 MG tablet Take 1,000 mg by mouth every 6 (six) hours as needed (for pain.).  .Marland Kitchenaspirin EC 81 MG tablet Take 1 tablet (81 mg total) by mouth daily.  .Marland Kitchenatorvastatin (LIPITOR) 40 MG tablet Take 1 tablet (40 mg total) by mouth daily.  . Blood Glucose Monitoring Suppl (ACCU-CHEK GUIDE) w/Device KIT 1 each by Does not apply route 3 (three) times daily.  .Marland Kitchen  Continuous Blood Gluc Receiver (DEXCOM G6 RECEIVER) DEVI 1 each by Does not apply route 6 (six) times daily.  . Continuous Blood Gluc Sensor (DEXCOM G6 SENSOR) MISC 1 each by Does not apply route 6 (six) times daily.  . Continuous Blood Gluc Transmit (DEXCOM G6 TRANSMITTER) MISC 1 each by Does not apply route 6 (six) times daily.  Marland Kitchen glucose blood (ACCU-CHEK GUIDE) test strip Check blood sugar up to 5 times a day  . insulin aspart (NOVOLOG) 100 UNIT/ML FlexPen Use 4-9U three times daily based on your carb counts and correction scale for a maximum of 24U daily  . insulin glargine (LANTUS) 100 UNIT/ML injection Inject 0.24 mLs (24 Units total) into the skin at bedtime.  . Insulin Pen Needle 32G X 4 MM MISC Use to inject insulin 3 times a day  . Insulin Syringe-Needle U-100 31G X 15/64" 0.3 ML MISC Use to inject insulin daily  . megestrol (MEGACE) 40 MG tablet Take 1 tablet (40 mg total) by mouth daily.  . multivitamin (RENA-VIT) TABS tablet Take 1  tablet by mouth daily.  . promethazine (PHENERGAN) 12.5 MG tablet Take 1 tablet (12.5 mg total) by mouth every 6 (six) hours as needed for nausea or vomiting.  Marland Kitchen PROVENTIL HFA 108 (90 Base) MCG/ACT inhaler INHALE 2 PUFFS BY MOUTH EVERY 4 HOURS AS NEEDED FOR COUGHING, WHEEZING, OR SHORTNESS OF BREATH (Patient taking differently: Inhale 2 puffs into the lungs every 4 (four) hours as needed for shortness of breath. )  . RENAGEL 800 MG tablet Take 4,800 mg by mouth See admin instructions. Take 4800 mg tablets three times daily with meals  . venlafaxine XR (EFFEXOR-XR) 150 MG 24 hr capsule Take 1 capsule (150 mg total) by mouth daily.   -- NOT TAKING DILTIAZEM  Allergies  Allergen Reactions  . Adhesive [Tape] Other (See Comments)    Irritation     Social History   Tobacco Use  . Smoking status: Former Smoker    Packs/day: 0.00    Years: 0.00    Pack years: 0.00    Last attempt to quit: 08/29/2015    Years since quitting: 3.3  . Smokeless tobacco: Never Used  Substance Use Topics  . Alcohol use: No    Alcohol/week: 0.0 standard drinks  . Drug use: No   Social History   Social History Narrative   Patient does not drink caffeine.   Patient is right handed.     family history includes Aneurysm in her mother; Cancer in her paternal grandfather; Diabetes in her father, maternal grandfather, maternal grandmother, mother, and sister; Hypertension in her father, mother, and sister; Stroke in her mother.  Wt Readings from Last 3 Encounters:  12/18/18 225 lb (102.1 kg)  11/29/18 229 lb 4.8 oz (104 kg)  11/26/18 232 lb 14.4 oz (105.6 kg)    PHYSICAL EXAM BP 112/72   Pulse 89   Ht _0  (1.651 m)   Wt 225 lb (102.1 kg)   BMI 37.44 kg/m  Physical Exam  Constitutional: She is oriented to person, place, and time. She appears well-nourished. No distress.  Morbidly obese with BMI of 37 and comorbidities. Well-groomed.  HENT:  Head: Normocephalic and atraumatic.  Neck: Normal range  of motion. Neck supple. Hepatojugular reflux (Trace) present. No JVD present. Carotid bruit is not present.  Cardiovascular: Normal rate, regular rhythm, S1 normal and S2 normal.  Occasional extrasystoles are present. PMI is not displaced (Difficult to assess). Exam reveals distant heart sounds and  decreased pulses (Mildly decreased left pedal pulses). Exam reveals no gallop and no friction rub.  No murmur heard. Right foot is in a brace.  Unable to palpate pulses  Pulmonary/Chest: Effort normal. No respiratory distress. She has no wheezes. She has no rales. She exhibits tenderness.  Mild diffuse scattered basal crackles with no rales or rhonchi.  No wheezing  Abdominal: Soft. Bowel sounds are normal. She exhibits no distension. There is no abdominal tenderness. There is no rebound.  Obese.  No palpable HSM.  Musculoskeletal: Normal range of motion.        General: No edema (Cannot assess right leg because of boot, but left leg has no edema).  Neurological: She is alert and oriented to person, place, and time.  Psychiatric: Her behavior is normal. Judgment and thought content normal.  Somewhat flat affect  Vitals reviewed.   Adult ECG Report Not checked   Other studies Reviewed: Additional studies/ records that were reviewed today include:  Recent Labs:   Lab Results  Component Value Date   CHOL 125 03/03/2017   HDL 41 (L) 03/03/2017   LDLCALC 65 03/03/2017   TRIG 93 03/03/2017   CHOLHDL 3.0 03/03/2017    ASSESSMENT / PLAN: Problem List Items Addressed This Visit    CAD S/P BMS PCI to prox LAD - Primary (Chronic)    She has intermittent weird symptoms in her chest, but they do not seem to be anginal in nature.  She never really had angina when she had a non-STEMI.  We evaluated her last fall with a Myoview stress test that was nonischemic. She status post bare-metal stent PCI to the LAD.  No longer on Plavix I guess because of bruising on dialysis --> not unreasonable since it was  bare-metal stent and we are now almost 3 years out..    She is only on aspirin and statin at this point.      Relevant Orders   Lipid panel   Hepatic function panel   Essential hypertension (Chronic)    No longer an issue.  Not even on diltiazem anymore.  Is not on any medications with borderline blood pressures that go down during dialysis.  No room for medications at this time.      Hyperlipidemia associated with type 2 diabetes mellitus (HCC) (Chronic)    Thankfully, her atorvastatin was restarted at 40 mg daily.  Has not had labs checked in a while, as of April  2018 her lipid panel was well controlled.  We will go and check a lipid panel now to see her baseline.  Target LDL less than 70 with goal less than 50.      Relevant Orders   Lipid panel   Hepatic function panel   Morbid obesity ([place]) (Chronic)    Now that she is in a boot and is able to walk, she needs to work on increasing exercise.  Also needs to adjust her diet.  Hopefully with diabetes education she is also getting some nutrition counseling.      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 Once her foot is out of the boot, we can order these studies, but difficult to do with her foot in the boot.          I spent a total of 64mnutes with the patient and chart review. >  50% of the time was spent in direct patient consultation.   Current medicines are  reviewed at length with the patient today.  (+/- concerns) n/a The following changes have been made:  n/a  Patient Instructions  Medication Instructions:  Not needed If you need a refill on your cardiac medications before your next appointment, please call your pharmacy.   Lab work: Lipid Hepatic Do not eat or drink the morning of the test. If you have labs (blood work) drawn today and your tests are completely normal, you will receive your results only by: Marland Kitchen MyChart Message (if you have  MyChart) OR . A paper copy in the mail If you have any lab test that is abnormal or we need to change your treatment, we will call you to review the results.  Testing/Procedure Not needed Follow-Up: At [facility], you and your health needs are our priority.  As part of our continuing mission to provide you with exceptional heart care, we have created designated Provider Care Teams.  These Care Teams include your primary Cardiologist (physician) and Advanced Practice Providers (APPs -  Physician Assistants and Nurse Practitioners) who all work together to provide you with the care you need, when you need it. You will need a follow up appointment in 6 months Aug 2020.  Please call our office 2 months in advance to schedule this appointment.  You may see Glenetta Hew, MD or one of the following Advanced Practice Providers on your designated Care Team:   Rosaria Ferries, PA-C . Jory Sims, DNP, ANP  Any Other Special Instructions Will Be Listed Below (If Applicable).    Studies Ordered:   Orders Placed This Encounter  Procedures  . Lipid panel  . Hepatic function panel      Glenetta Hew, M.D., M.S. Interventional Cardiologist   Pager # [phone] Phone # [phone] [address] [place], [place] [address]   Thank you for choosing Heartcare at [facility]!!

## 2018-12-18 NOTE — Patient Instructions (Addendum)
Medication Instructions:  Not needed If you need a refill on your cardiac medications before your next appointment, please call your pharmacy.   Lab work: Lipid Hepatic Do not eat or drink the morning of the test. If you have labs (blood work) drawn today and your tests are completely normal, you will receive your results only by: Marland Kitchen MyChart Message (if you have MyChart) OR . A paper copy in the mail If you have any lab test that is abnormal or we need to change your treatment, we will call you to review the results.  Testing/Procedure Not needed Follow-Up: At [facility], you and your health needs are our priority.  As part of our continuing mission to provide you with exceptional heart care, we have created designated Provider Care Teams.  These Care Teams include your primary Cardiologist (physician) and Advanced Practice Providers (APPs -  Physician Assistants and Nurse Practitioners) who all work together to provide you with the care you need, when you need it. You will need a follow up appointment in 6 months Aug 2020.  Please call our office 2 months in advance to schedule this appointment.  You may see Glenetta Hew, MD or one of the following Advanced Practice Providers on your designated Care Team:   Rosaria Ferries, PA-C . Jory Sims, DNP, ANP  Any Other Special Instructions Will Be Listed Below (If Applicable).

## 2018-12-20 ENCOUNTER — Encounter: Payer: Self-pay | Admitting: Cardiology

## 2018-12-20 NOTE — Assessment & Plan Note (Signed)
No longer an issue.  Not even on diltiazem anymore.  Is not on any medications with borderline blood pressures that go down during dialysis.  No room for medications at this time.

## 2018-12-20 NOTE — Assessment & Plan Note (Addendum)
Plan was for lower extremity arterial Dopplers. Once her foot is out of the boot, we can order these studies, but difficult to do with her foot in the boot.

## 2018-12-20 NOTE — Assessment & Plan Note (Signed)
Thankfully, her atorvastatin was restarted at 40 mg daily.  Has not had labs checked in a while, as of April  2018 her lipid panel was well controlled.  We will go and check a lipid panel now to see her baseline.  Target LDL less than 70 with goal less than 50.

## 2018-12-20 NOTE — Assessment & Plan Note (Signed)
She has intermittent weird symptoms in her chest, but they do not seem to be anginal in nature.  She never really had angina when she had a non-STEMI.  We evaluated her last fall with a Myoview stress test that was nonischemic. She status post bare-metal stent PCI to the LAD.  No longer on Plavix I guess because of bruising on dialysis --> not unreasonable since it was bare-metal stent and we are now almost 3 years out..    She is only on aspirin and statin at this point.

## 2018-12-20 NOTE — Assessment & Plan Note (Signed)
Now that she is in a boot and is able to walk, she needs to work on increasing exercise.  Also needs to adjust her diet.  Hopefully with diabetes education she is also getting some nutrition counseling.

## 2019-01-03 ENCOUNTER — Encounter: Payer: Self-pay | Admitting: General Practice

## 2019-01-03 ENCOUNTER — Ambulatory Visit: Payer: Self-pay | Admitting: Obstetrics & Gynecology

## 2019-01-18 MED FILL — VENLAFAXINE HCL ER 150 MG C: 150 | 30 days supply | Qty: 30 | Fill #1 | Status: TO

## 2019-01-18 MED FILL — MEGESTROL 40 MG TABLET: 40 | 30 days supply | Qty: 30 | Fill #1 | Status: TO

## 2019-01-28 ENCOUNTER — Telehealth: Payer: Self-pay | Admitting: Dietician

## 2019-01-28 NOTE — Telephone Encounter (Signed)
Thank you!  Jane [name] 

## 2019-01-28 NOTE — Telephone Encounter (Signed)
Called Jane [name] to follow up on the CGM order for her. She says it was approved and delivered on a day when she was not there. It sat at the post office too long and was returned. She reports having the phone number to call to have it delivered again and will call our office when she gets it to be trained how to use it.

## 2019-02-06 MED FILL — VENLAFAXINE HCL ER 150 MG C: 150 | 30 days supply | Qty: 30 | Fill #0

## 2019-02-06 MED FILL — MEGESTROL 40 MG TABLET: 40 | 30 days supply | Qty: 30 | Fill #0

## 2019-02-11 DIAGNOSIS — M1712 Unilateral primary osteoarthritis, left knee: Secondary | ICD-10-CM | POA: Insufficient documentation

## 2019-03-16 MED FILL — MEGESTROL 40 MG TABLET: 40 | 30 days supply | Qty: 30 | Fill #1 | Status: TO

## 2019-03-16 MED FILL — UNIFINE PENTIPS 32GX5/32": 32G X 4 MM | 90 days supply | Qty: 300 | Fill #0

## 2019-03-16 MED FILL — LANTUS 100 UNITS/ML VIAL: 100 | 28 days supply | Qty: 10 | Fill #0

## 2019-03-16 MED FILL — VENLAFAXINE HCL ER 150 MG C: 150 | 30 days supply | Qty: 30 | Fill #1

## 2019-03-16 MED FILL — UNIFINE PENTIPS 32GX5/32: 32G X 4 MM | 90 days supply | Qty: 300 | Fill #0

## 2019-03-16 MED FILL — ATORVASTATIN 40 MG TABLET: 40 | 90 days supply | Qty: 90 | Fill #0

## 2019-04-16 MED FILL — MEGESTROL 40 MG TABLET: 40 | 30 days supply | Qty: 30 | Fill #0

## 2019-04-18 ENCOUNTER — Other Ambulatory Visit: Payer: Self-pay | Admitting: Obstetrics & Gynecology

## 2019-04-18 DIAGNOSIS — N951 Menopausal and female climacteric states: Secondary | ICD-10-CM

## 2019-04-18 DIAGNOSIS — F320 Major depressive disorder, single episode, mild: Secondary | ICD-10-CM

## 2019-04-18 MED ORDER — VENLAFAXINE HCL ER 150 MG PO CP24: 150.0000 mg | ORAL_CAPSULE | Freq: Every day | ORAL | 6 refills | Status: DC

## 2019-04-22 DIAGNOSIS — E11621 Type 2 diabetes mellitus with foot ulcer: Secondary | ICD-10-CM | POA: Insufficient documentation

## 2019-04-22 DIAGNOSIS — L97509 Non-pressure chronic ulcer of other part of unspecified foot with unspecified severity: Secondary | ICD-10-CM

## 2019-04-22 HISTORY — DX: Type 2 diabetes mellitus with foot ulcer: E11.621

## 2019-04-22 HISTORY — DX: Type 2 diabetes mellitus with foot ulcer: L97.509

## 2019-04-22 MED FILL — CEPHALEXIN 500 MG CAPSULE: 500 | 7 days supply | Qty: 28 | Fill #0

## 2019-04-25 ENCOUNTER — Encounter: Payer: Self-pay | Admitting: *Deleted

## 2019-05-06 ENCOUNTER — Telehealth: Payer: Self-pay | Admitting: Internal Medicine

## 2019-05-06 NOTE — Telephone Encounter (Signed)
Returned call to patient. States she received antibiotic for left foot ulceration. Will need to have MRI of this area to ensure it's not osteomyelitis. Took last antibiotic tabl today and now with vaginal itching and light, white vag d/c. States she called provider who prescribed antibiotic Alfredo Martinez, [place]) and was told she would need to call PCP. States diflucan has worked well in past. Ship broker. Will route to Attending Pool. Hubbard Hartshorn, RN, BSN

## 2019-05-06 NOTE — Telephone Encounter (Signed)
Pt reporting white d/c, itching after taking an antibiotic medication ,and would like a prescription called in rather then coming in if possible.  Please call pt back.

## 2019-05-07 ENCOUNTER — Ambulatory Visit (INDEPENDENT_AMBULATORY_CARE_PROVIDER_SITE_OTHER): Payer: Medicare Other | Admitting: Internal Medicine

## 2019-05-07 ENCOUNTER — Encounter: Payer: Self-pay | Admitting: Internal Medicine

## 2019-05-07 ENCOUNTER — Other Ambulatory Visit: Payer: Self-pay

## 2019-05-07 DIAGNOSIS — N898 Other specified noninflammatory disorders of vagina: Secondary | ICD-10-CM

## 2019-05-07 DIAGNOSIS — E1122 Type 2 diabetes mellitus with diabetic chronic kidney disease: Secondary | ICD-10-CM | POA: Diagnosis not present

## 2019-05-07 DIAGNOSIS — Z87891 Personal history of nicotine dependence: Secondary | ICD-10-CM

## 2019-05-07 DIAGNOSIS — Z8742 Personal history of other diseases of the female genital tract: Secondary | ICD-10-CM

## 2019-05-07 DIAGNOSIS — Z992 Dependence on renal dialysis: Secondary | ICD-10-CM

## 2019-05-07 DIAGNOSIS — Z794 Long term (current) use of insulin: Secondary | ICD-10-CM

## 2019-05-07 DIAGNOSIS — N186 End stage renal disease: Secondary | ICD-10-CM | POA: Diagnosis not present

## 2019-05-07 DIAGNOSIS — Z7982 Long term (current) use of aspirin: Secondary | ICD-10-CM | POA: Diagnosis not present

## 2019-05-07 MED ORDER — FLUCONAZOLE 150 MG PO TABS: 150.0000 mg | ORAL_TABLET | Freq: Once | ORAL | 0 refills | Status: AC

## 2019-05-07 NOTE — Telephone Encounter (Signed)
S/w Pt. Pt has been sch @ 2:15 pm with [facility].

## 2019-05-07 NOTE — Assessment & Plan Note (Signed)
HPI: [name] is a 43 y.o. female with ESRD on HD and insulin-dependent DMII as well as the other medical conditions listed below who called the internal medicine clinic for vaginal itching and discharge that started three days ago. She states she has been on Keflex for the past two weeks for an ulceration on her foot. This was prescribed to her by a provider at [facility]. She finished the course of antibiotics yesterday. She endorses vaginal itching and discharge (clear with some brown), but denies abnormal vaginal bleeding or dysuria. She states she had a yeast infection years ago and this feels similar. She is currently sexually active with one female partner (monogamous for the past five years). She was treated for trichomonas in 2013, but denies history of any other STI.   Assessment: Will treat empirically for yeast infection in the setting of prolonged antibiotic use. Advised patient to call our clinic if her symptoms do not resolve. If that is the case, she will need a pelvic exam with wet prep to rule out other infections.  Plan - Diflucan 150mg  once. Repeat in 72 hours if still symptomatic.

## 2019-05-07 NOTE — Progress Notes (Signed)
   This is a telephone encounter between Jane [name] and Jane [name] on 05/07/2019 for vaginal itching. The visit was conducted with the patient located at home and [facility] [name] at Home. The patient's identity was confirmed using their DOB and current address. The patient has consented to being evaluated through a telephone encounter and understands the associated risks/benefits. I personally spent 10 minutes on medical discussion.   HPI:   [name] is a 43 y.o. female with ESRD on HD and insulin-dependent DMII as well as the other medical conditions listed below who called the internal medicine clinic for vaginal itching and discharge that started three days ago. She states she has been on Keflex for the past two weeks for an ulceration on her foot. This was prescribed to her by a provider at [facility]. She finished the course of antibiotics yesterday. She endorses vaginal itching and discharge (clear with some brown), but denies abnormal vaginal bleeding or dysuria. She states she had a yeast infection years ago and this feels similar. She is currently sexually active with one female partner (monogamous for the past five years). She was treated for trichomonas in 2013, but denies history of any other STI. Please see problem based charting for the assessment and plan.   Past Medical History:  Diagnosis Date  . Asthma    prn inhaler  . CAD S/P BMS PCI to prox LAD 03/31/2016   Ost LAD to Mid LAD lesion, 90% stenosed. Post intervention - Vision BMS 3.0 mm x 18 mm (~3.5 mm) there is a 0% residual stenosis.   . Charcot foot due to diabetes mellitus ([place]) 11/2016   left 2018; right 2019  . Depression   . Diabetic neuropathy (HCC)    feet  . ESRD (end stage renal disease) on dialysis ([place]) 12/2017   ?chronic interstitial nephritis  . H/O non-ST elevation myocardial infarction (NSTEMI) 03/2016   Found in 90% mid LAD lesion treated with bare-metal stent (BMS) PCI - vision  BMS 3.0 mm x 18 mm  . History of MRSA infection   . Hyperlipidemia   . Hypertension    medication dose increased 11/22/2015; has been taking med. consistently since 03/2016, per pt.  . Insulin dependent diabetes mellitus ([place])   . Iron deficiency anemia    takes iron supplement  . Retinopathy of both eyes   . Sickle cell trait ([place])   . 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 Remainder of all ROS negative.   Assessment & Plan:   Patient discussed with Dr. Angelia Mould

## 2019-05-07 NOTE — Telephone Encounter (Signed)
pls sch telehealth appt today

## 2019-05-08 IMAGING — US US RENAL
1 series · 14 of 25 positions shown · non-contrast
Comparison: None.

CLINICAL DATA: Acute kidney injury

EXAM:
RENAL / URINARY TRACT ULTRASOUND COMPLETE

[Series 1: us renal · 0.23mm/px · 14 of 32 slices shown]
[im 1/32]
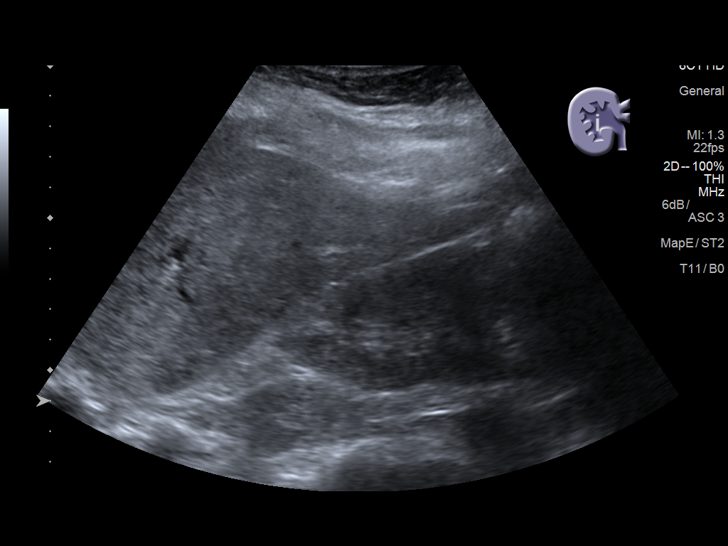
[im 3/32]
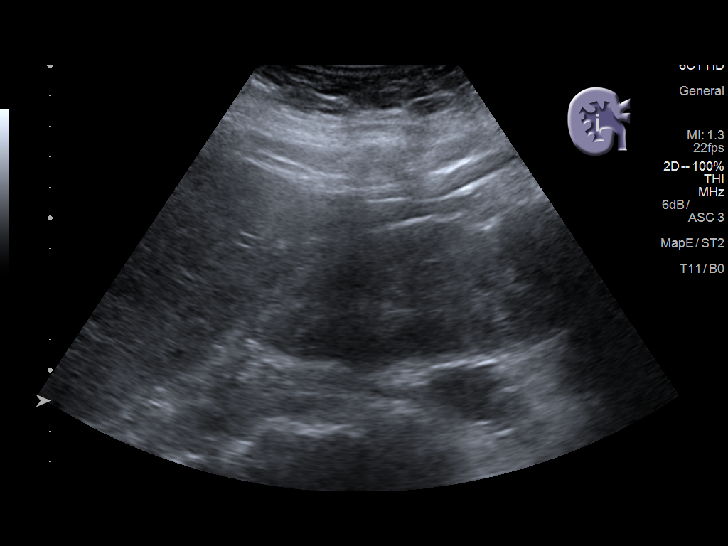
[im 6/32]
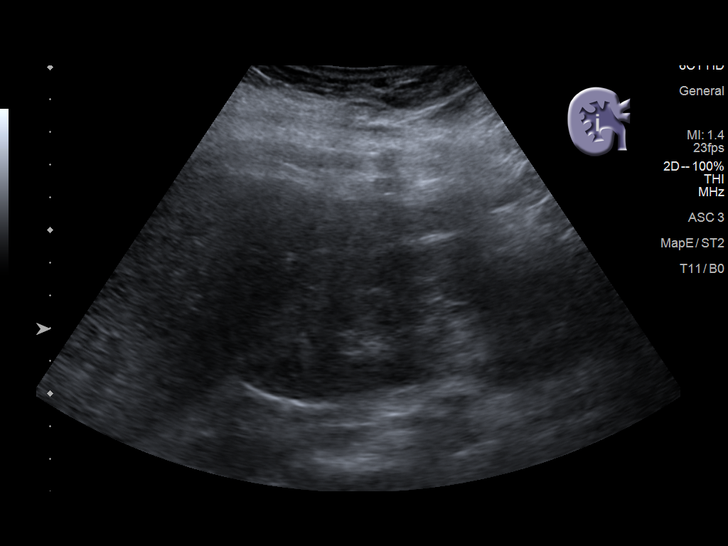
[im 8/32]
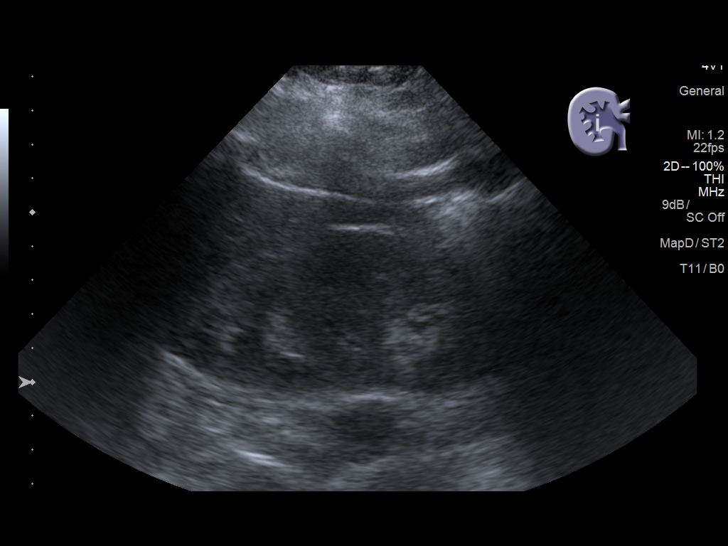
[im 11/32]
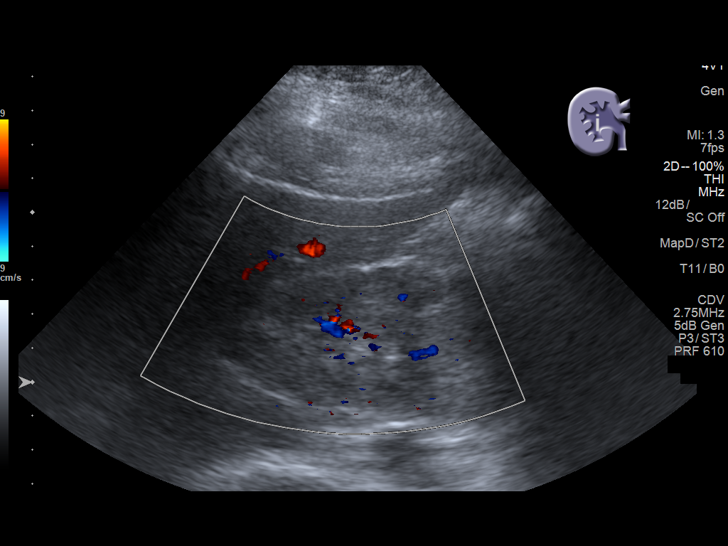
[im 12/32]
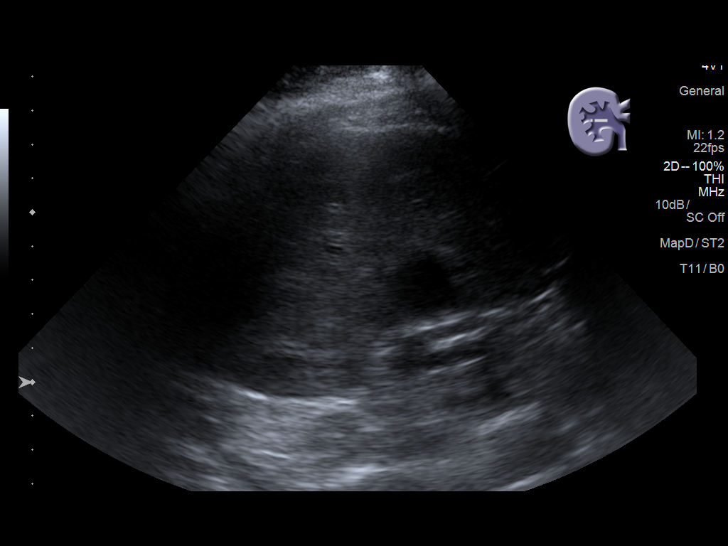
[im 15/32]
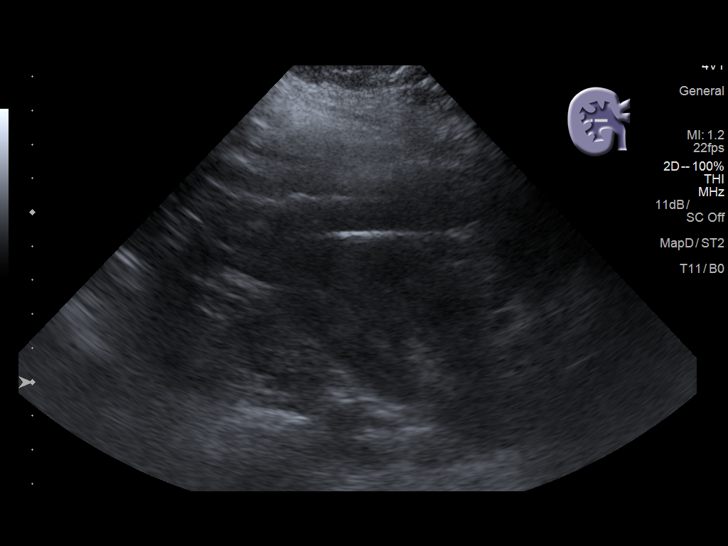
[im 17/32]
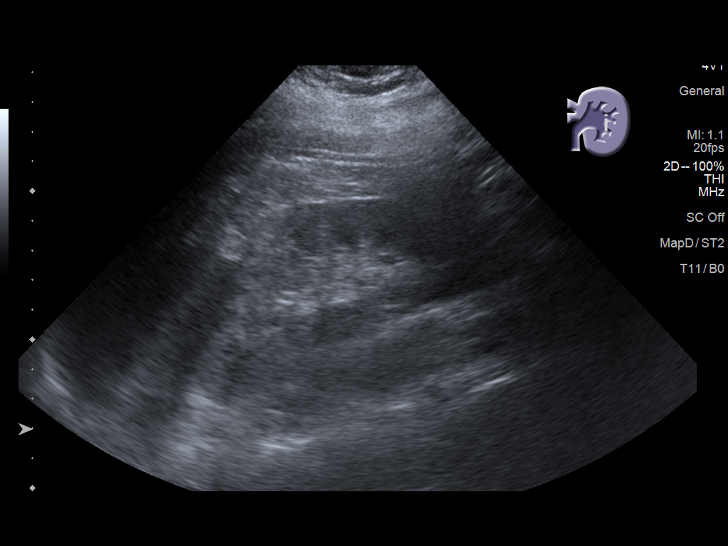
[im 20/32]
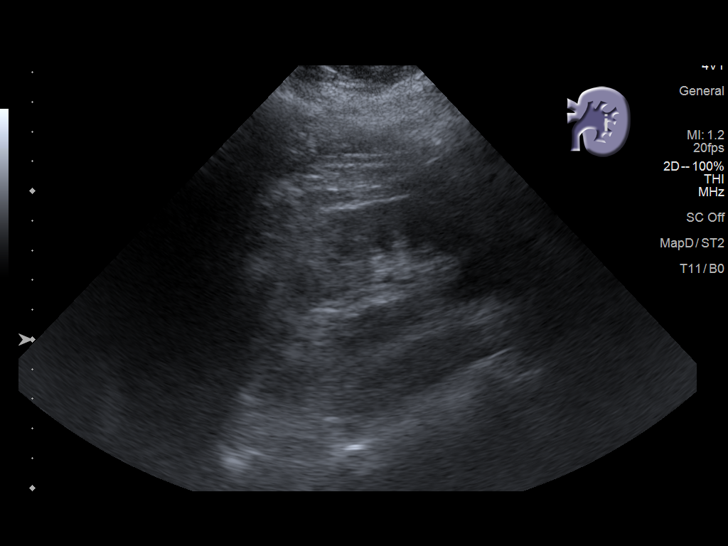
[im 21/32]
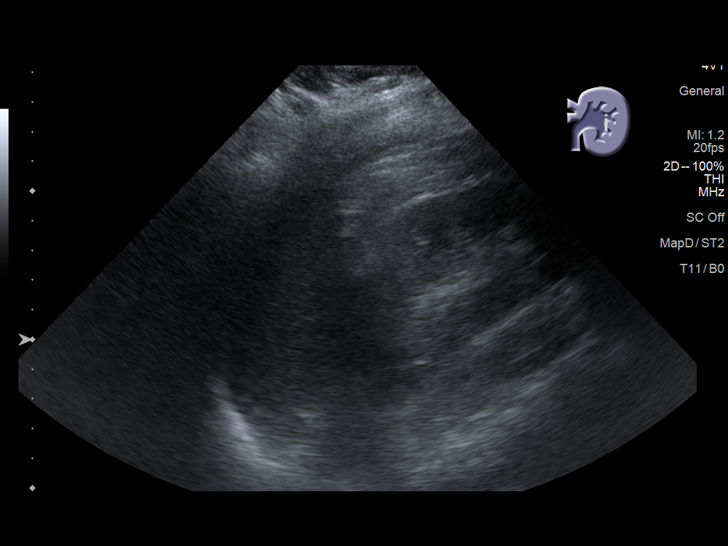
[im 24/32]
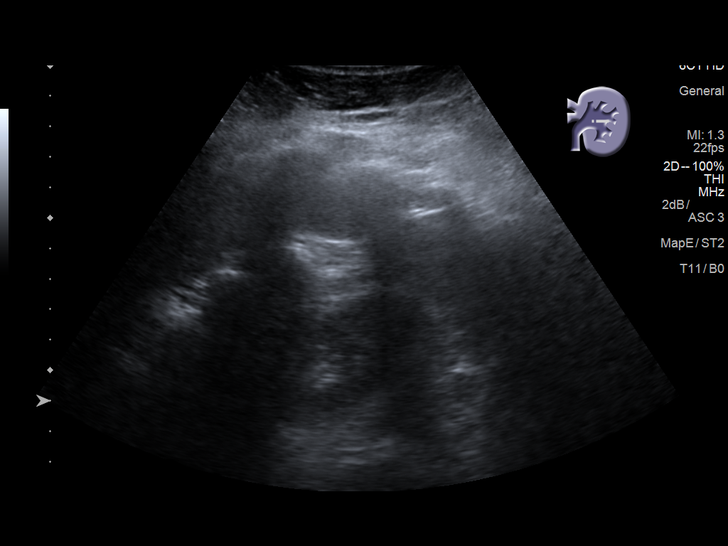
[im 26/32]
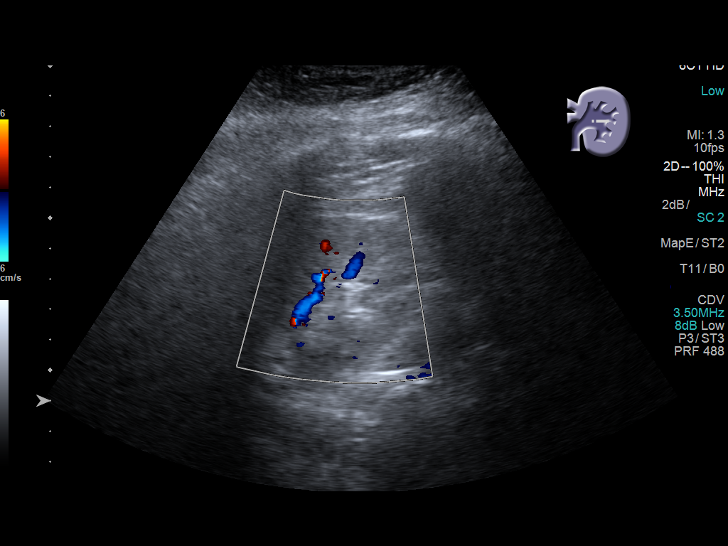
[im 29/32]
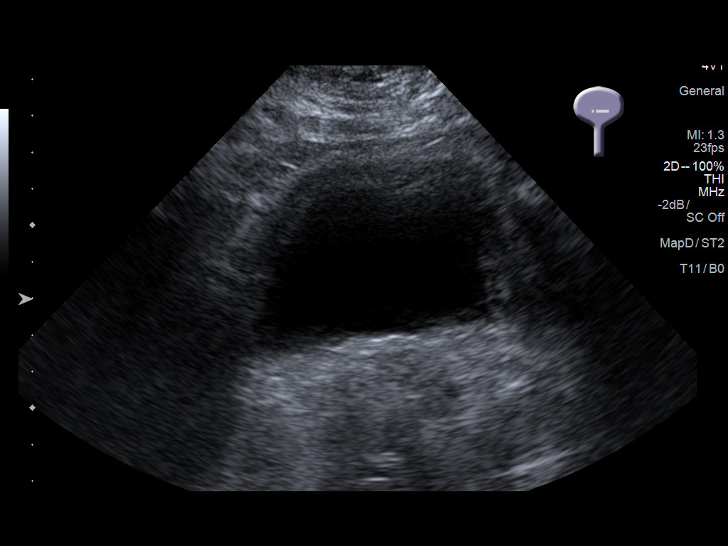
[im 32/32]
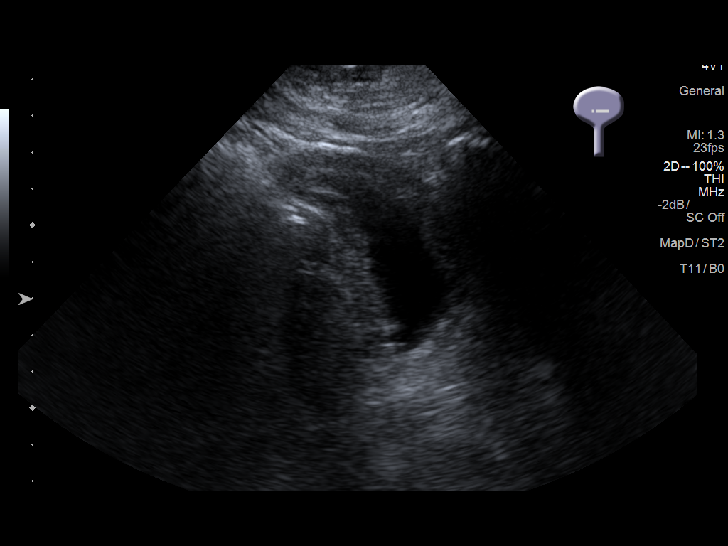

[14 of 25 positions shown; findings below may reference images not displayed]

FINDINGS: Right Kidney:

Length: 11.2 cm. Echogenicity within normal limits. No mass or
hydronephrosis visualized.

Left Kidney:

Length: 11.3 cm. Echogenicity within normal limits. No mass or
hydronephrosis visualized.

Bladder:

Appears normal for degree of bladder distention.
IMPRESSION: Unremarkable renal ultrasound.

## 2019-05-08 IMAGING — US US BIOPSY
1 series · 8 of 8 positions shown · non-contrast
Comparison: none

INDICATION: 41-year-old female with a history of chronic renal insufficiency.

[Series 1: us biopsy · 0.22mm/px · 8 of 8 slices shown]
[im 1/8]
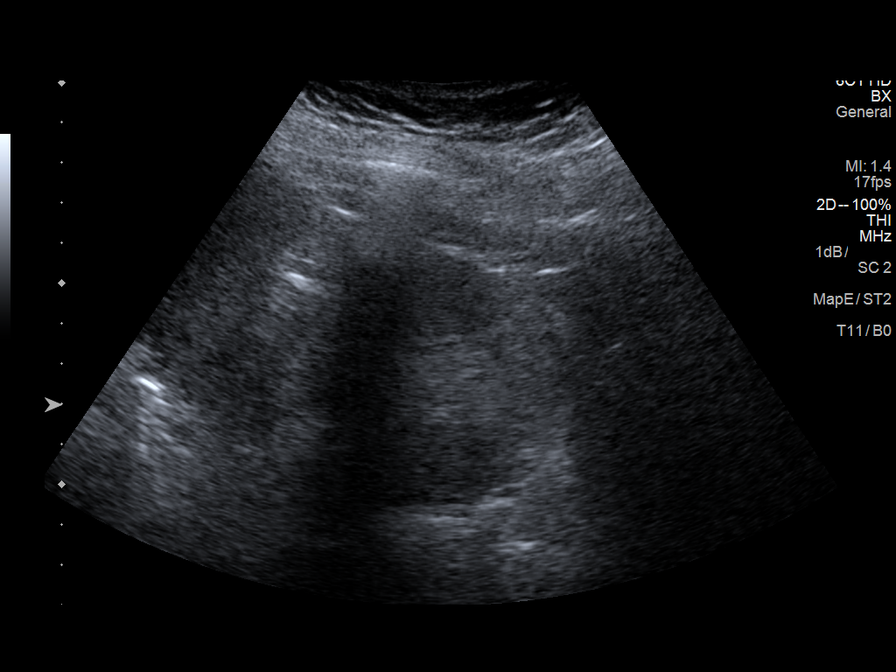
[im 2/8]
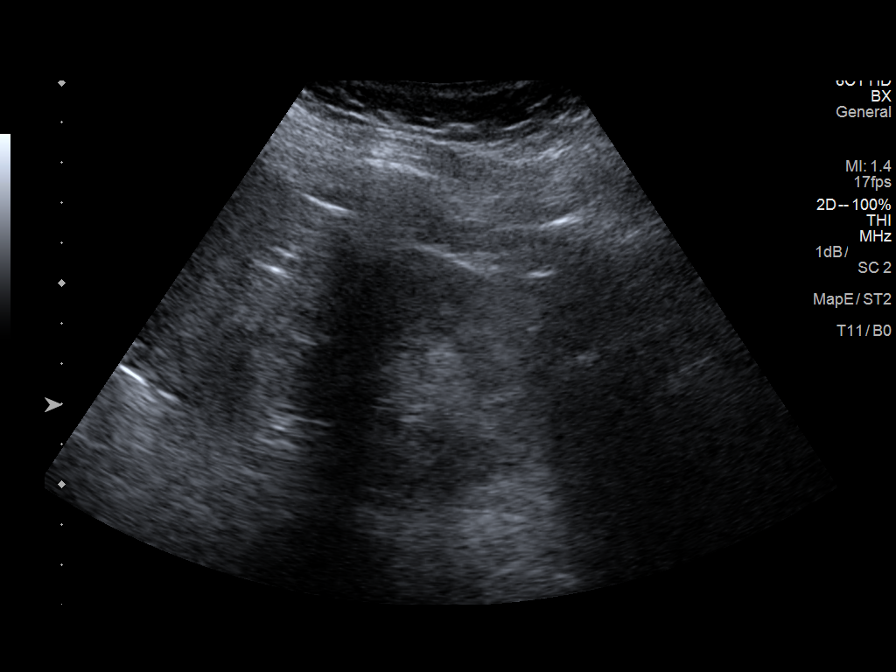
[im 3/8]
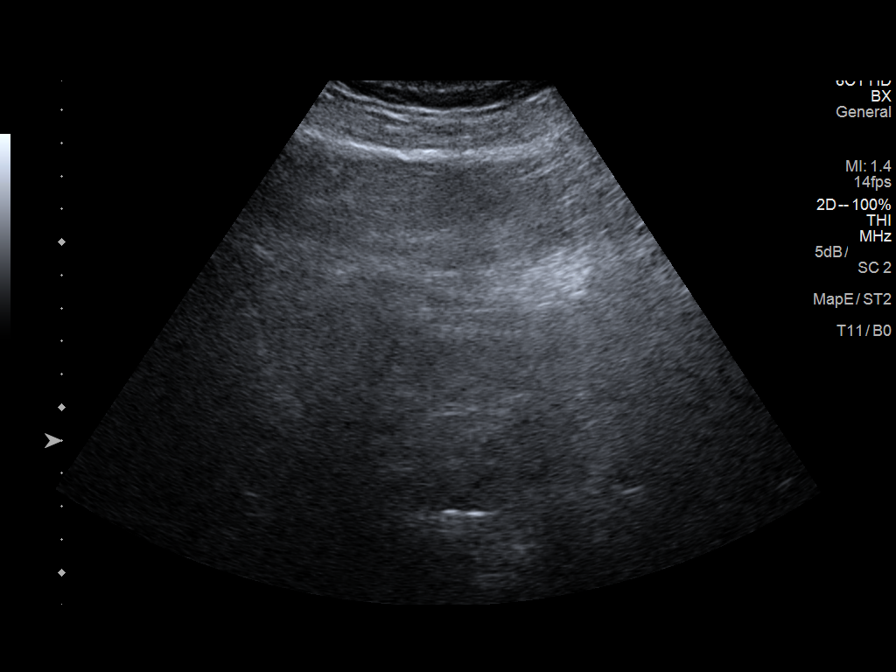
[im 4/8]
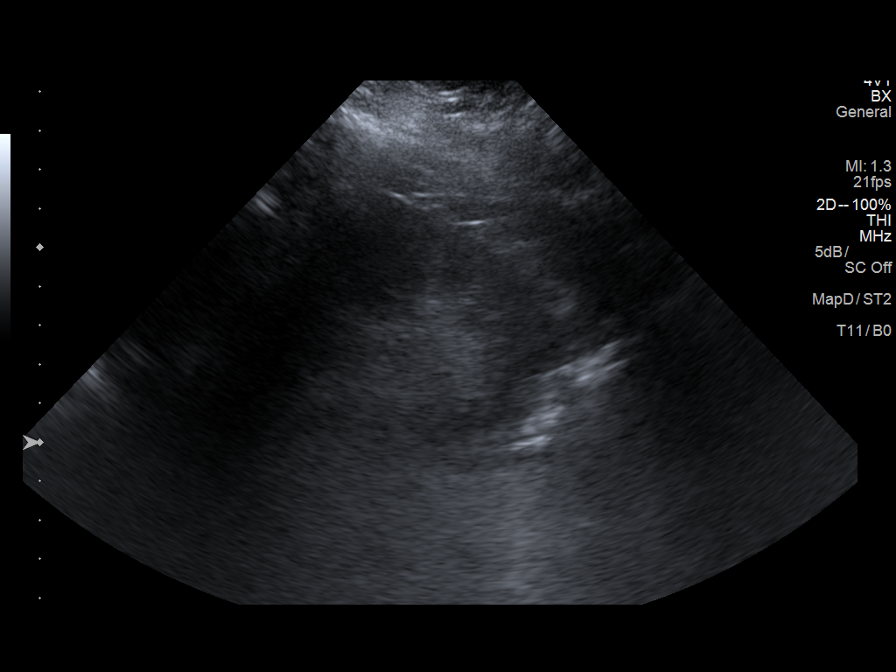
[im 5/8]
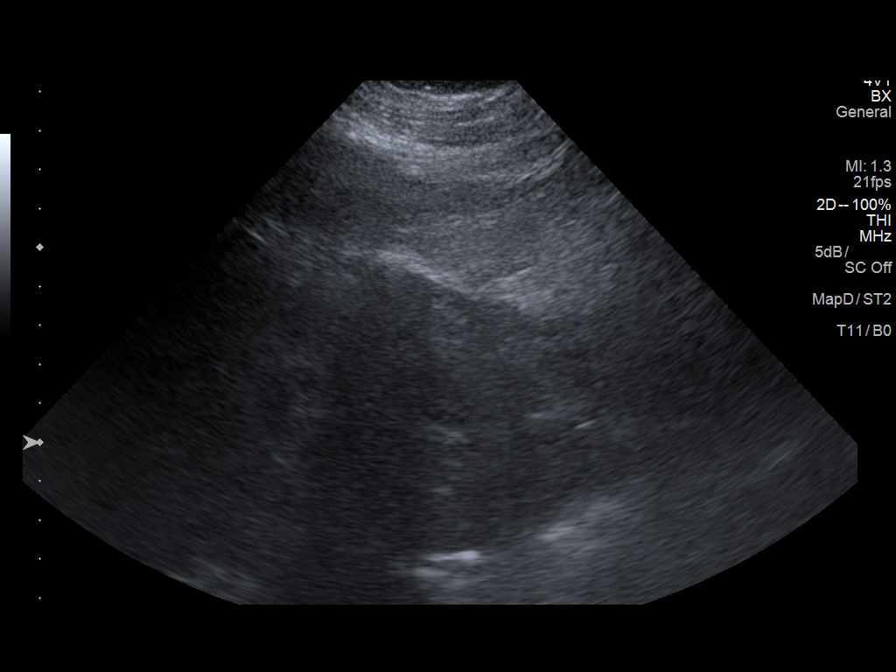
[im 6/8]
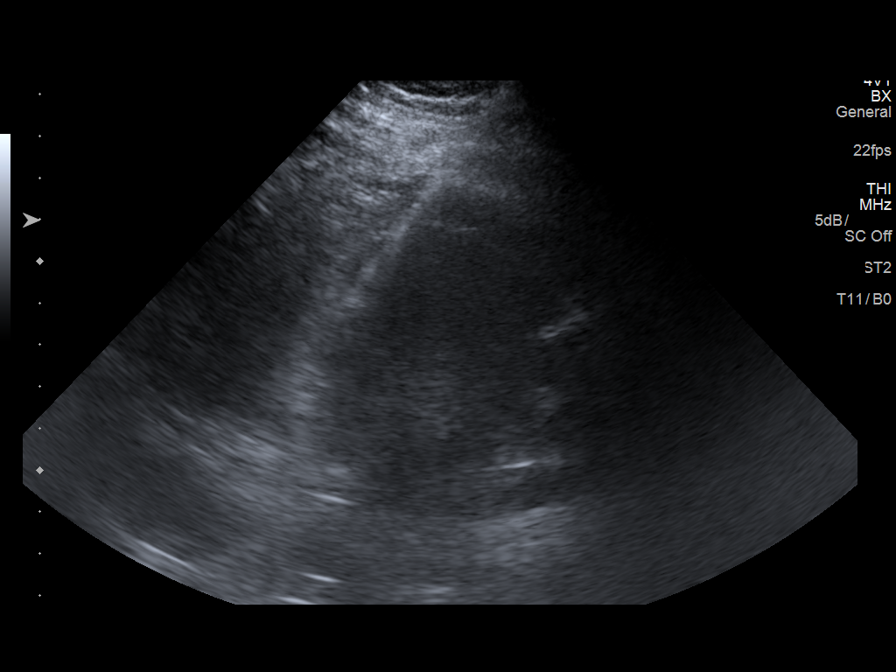
[im 7/8]
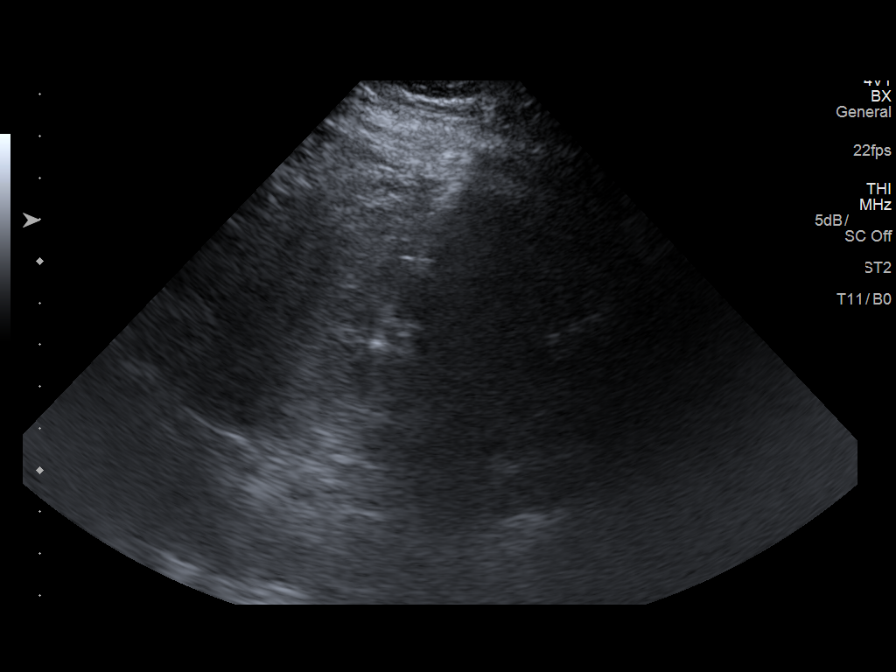
[im 8/8]
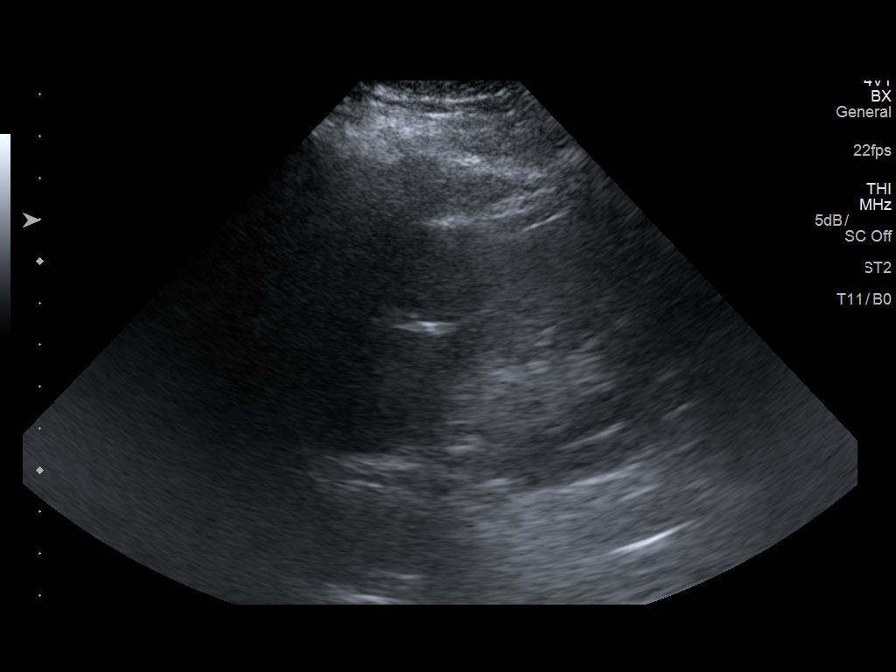

[8 of 8 positions shown; findings below may reference images not displayed]

EXAM:
ULTRASOUND GUIDED MEDICAL RENAL BIOPSY

MEDICATIONS:
None.

ANESTHESIA/SEDATION:
Moderate (conscious) sedation was employed during this procedure. A
total of Versed 1.5 mg and Fentanyl 75 mcg was administered
intravenously.

Moderate Sedation Time: 13 minutes. The patient's level of
consciousness and vital signs were monitored continuously by
radiology nursing throughout the procedure under my direct
supervision.

FLUOROSCOPY TIME:  None

COMPLICATIONS:
None

PROCEDURE:
Informed written consent was obtained from the patient after a
thorough discussion of the procedural risks, benefits and
alternatives. All questions were addressed. Maximal Sterile Barrier
Technique was utilized including caps, mask, sterile gowns, sterile
gloves, sterile drape, hand hygiene and skin antiseptic. A timeout
was performed prior to the initiation of the procedure.

Patient was positioned prone position on the gantry table. Images
were stored sent to PACs.

Once the patient is prepped and draped in the usual sterile fashion,
the skin and subcutaneous tissues overlying the left kidney were
generously infiltrated 1% lidocaine for local anesthesia.

Using ultrasound guidance, a 15 gauge guide needle was advanced into
the lower cortex of the left kidney.

Once we confirmed location of the needle tip, 2 separate 16 gauge
core biopsy were achieved.

Two Gel-Foam pledgets were infused with a small amount of saline.
The needle was removed.

Final images were stored.

The patient tolerated the procedure well and remained
hemodynamically stable throughout.

No complications were encountered and no significant blood loss
encountered.
IMPRESSION: Status post ultrasound-guided medical renal biopsy. Tissue specimen
sent to pathology for complete histopathologic analysis.

## 2019-05-15 ENCOUNTER — Encounter: Payer: Self-pay | Admitting: Internal Medicine

## 2019-05-15 ENCOUNTER — Encounter: Payer: Medicare Other | Admitting: Internal Medicine

## 2019-05-20 NOTE — Progress Notes (Signed)
[facility] Clinic Attending  Case discussed with Dr. Dorrell at the time of the visit.  We reviewed the resident's history and exam and pertinent patient test results.  I agree with the assessment, diagnosis, and plan of care documented in the resident's note.    

## 2019-05-27 MED FILL — MEGESTROL 40 MG TABLET: 40 | 30 days supply | Qty: 30 | Fill #0

## 2019-05-28 IMAGING — DX DG CHEST 2V
2 series · 2 of 2 positions shown · non-contrast
Comparison: 06/18/2016

CLINICAL DATA: Renal failure without dialysis. Fluid retention and
left flank pain.

EXAM:
CHEST  2 VIEW

[chest lat]
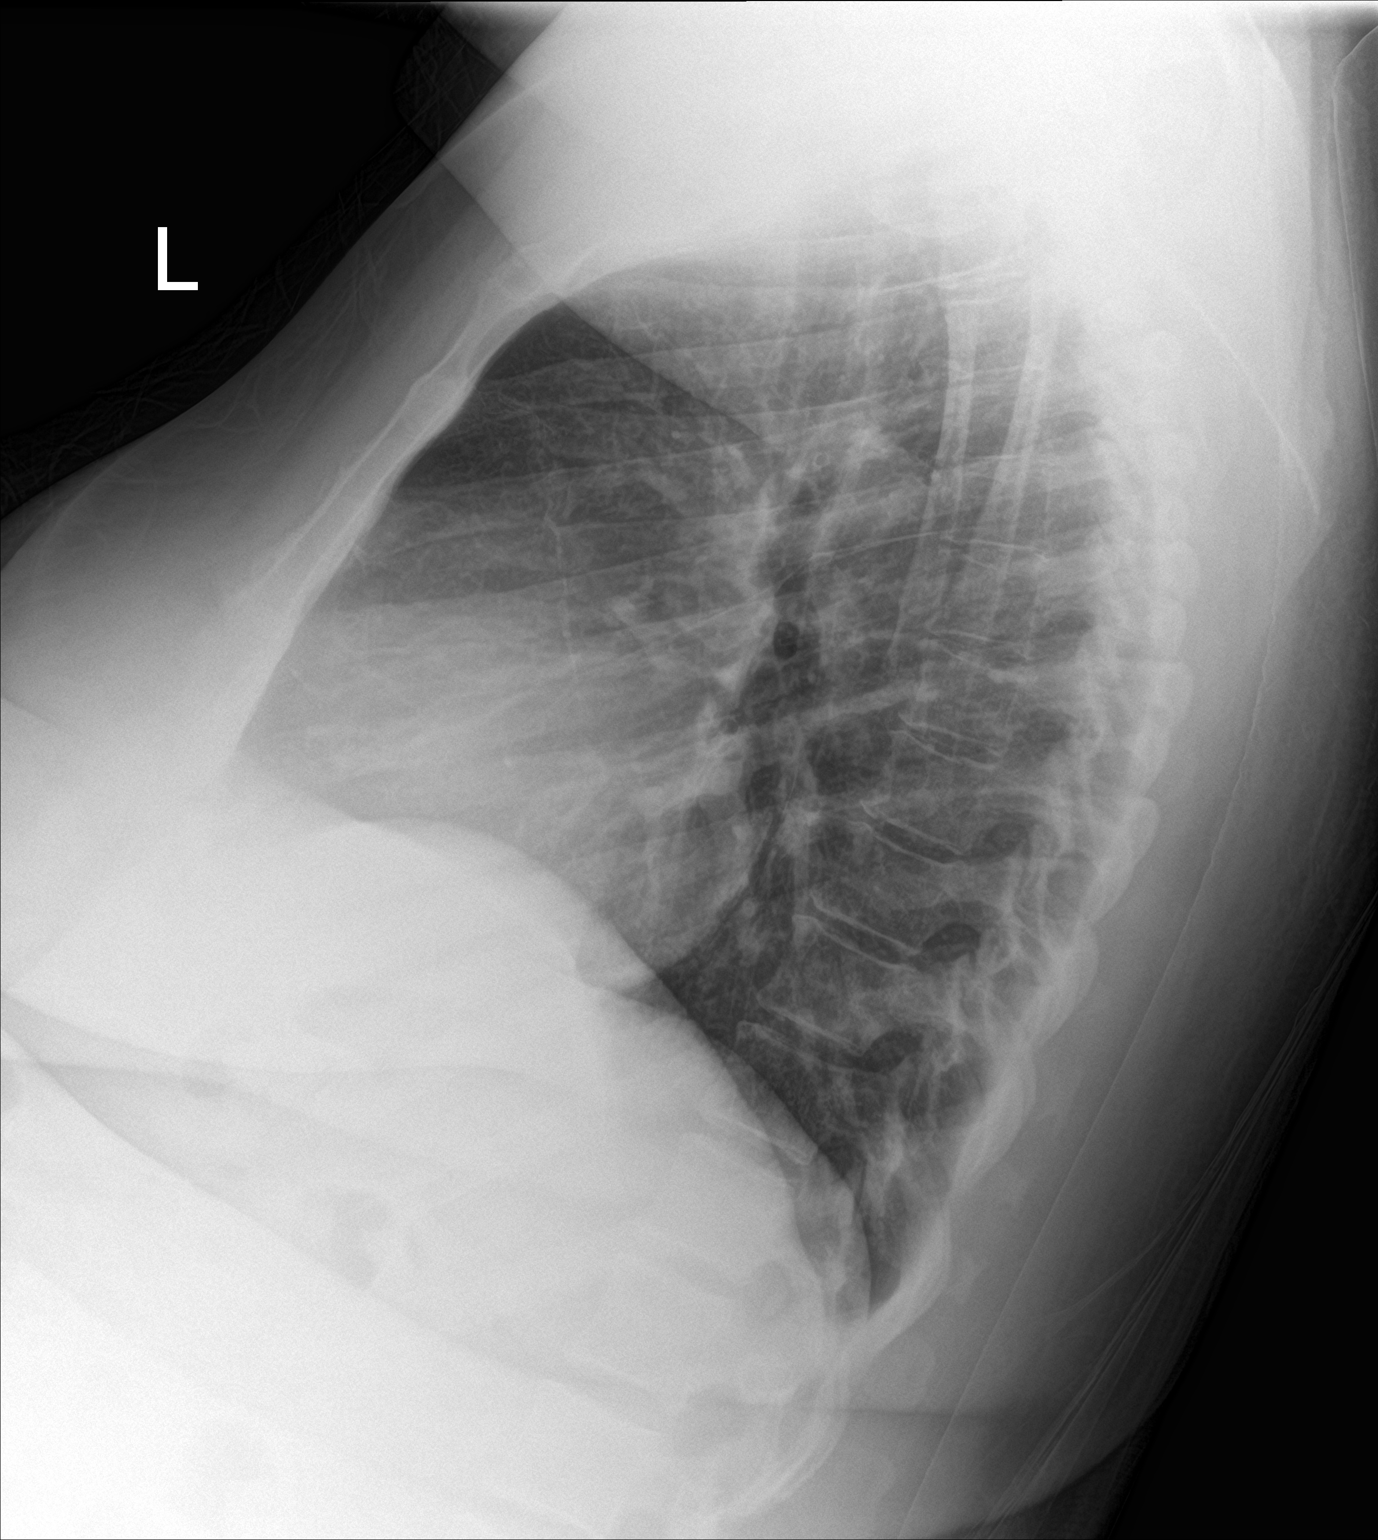

[chest ap]
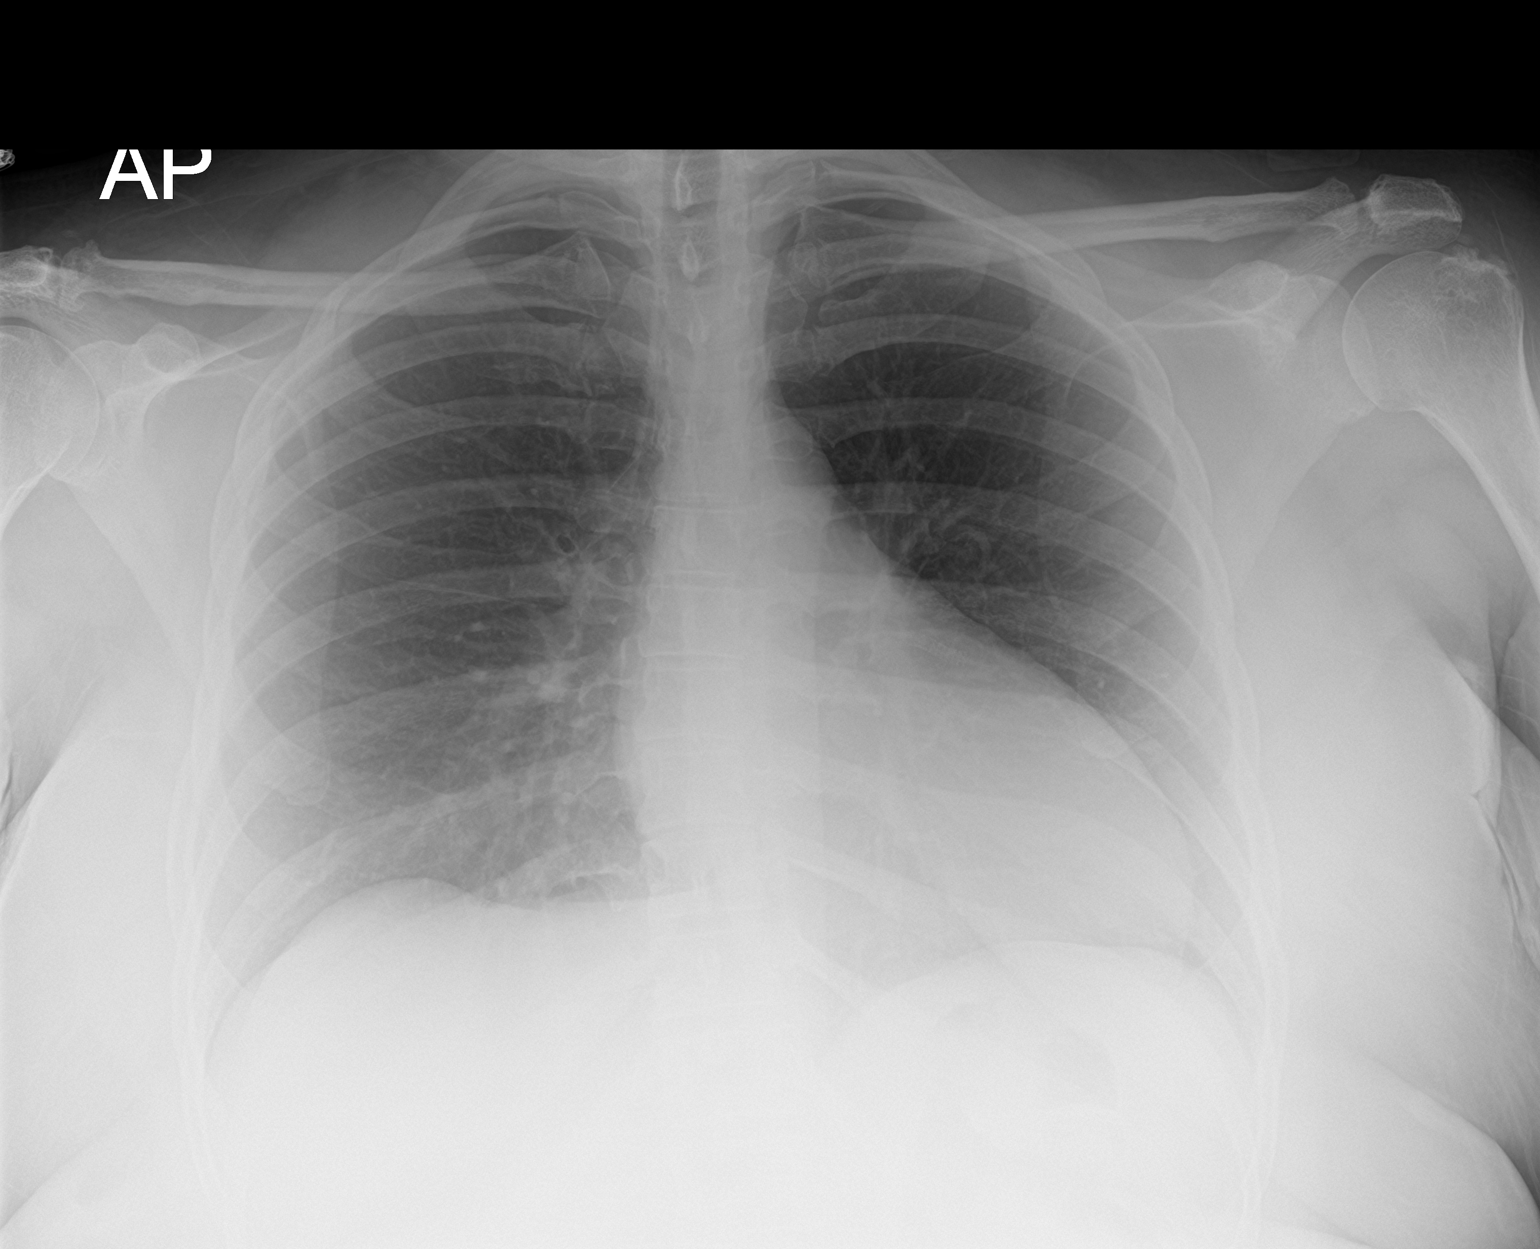

[2 of 2 positions shown; findings below may reference images not displayed]

FINDINGS: The heart size and mediastinal contours are within normal limits.
Both lungs are clear. The visualized skeletal structures are
unremarkable.
IMPRESSION: No active cardiopulmonary disease.

## 2019-06-03 DIAGNOSIS — E11621 Type 2 diabetes mellitus with foot ulcer: Secondary | ICD-10-CM | POA: Insufficient documentation

## 2019-06-03 DIAGNOSIS — L97529 Non-pressure chronic ulcer of other part of left foot with unspecified severity: Secondary | ICD-10-CM

## 2019-06-03 HISTORY — DX: Type 2 diabetes mellitus with foot ulcer: L97.529

## 2019-06-03 HISTORY — DX: Type 2 diabetes mellitus with foot ulcer: E11.621

## 2019-06-14 ENCOUNTER — Other Ambulatory Visit (HOSPITAL_COMMUNITY)
Admission: RE | Admit: 2019-06-14 | Discharge: 2019-06-14 | Disposition: A | Discharge status: Home / Self Care | Payer: Medicare Other | Source: Other Acute Inpatient Hospital | Attending: Internal Medicine | Admitting: Internal Medicine

## 2019-06-14 ENCOUNTER — Encounter (HOSPITAL_BASED_OUTPATIENT_CLINIC_OR_DEPARTMENT_OTHER): Payer: Medicare Other | Attending: Internal Medicine

## 2019-06-14 DIAGNOSIS — E11621 Type 2 diabetes mellitus with foot ulcer: Secondary | ICD-10-CM | POA: Insufficient documentation

## 2019-06-14 DIAGNOSIS — I2510 Atherosclerotic heart disease of native coronary artery without angina pectoris: Secondary | ICD-10-CM | POA: Insufficient documentation

## 2019-06-14 DIAGNOSIS — E11610 Type 2 diabetes mellitus with diabetic neuropathic arthropathy: Secondary | ICD-10-CM | POA: Insufficient documentation

## 2019-06-14 DIAGNOSIS — L97523 Non-pressure chronic ulcer of other part of left foot with necrosis of muscle: Secondary | ICD-10-CM | POA: Insufficient documentation

## 2019-06-14 DIAGNOSIS — E1151 Type 2 diabetes mellitus with diabetic peripheral angiopathy without gangrene: Secondary | ICD-10-CM | POA: Diagnosis not present

## 2019-06-14 DIAGNOSIS — D571 Sickle-cell disease without crisis: Secondary | ICD-10-CM | POA: Diagnosis not present

## 2019-06-14 DIAGNOSIS — E1142 Type 2 diabetes mellitus with diabetic polyneuropathy: Secondary | ICD-10-CM | POA: Diagnosis not present

## 2019-06-14 DIAGNOSIS — L97522 Non-pressure chronic ulcer of other part of left foot with fat layer exposed: Secondary | ICD-10-CM | POA: Diagnosis not present

## 2019-06-14 DIAGNOSIS — I10 Essential (primary) hypertension: Secondary | ICD-10-CM | POA: Insufficient documentation

## 2019-06-17 LAB — AEROBIC CULTURE W GRAM STAIN (SUPERFICIAL SPECIMEN): Gram Stain: NONE SEEN

## 2019-06-21 ENCOUNTER — Encounter (HOSPITAL_BASED_OUTPATIENT_CLINIC_OR_DEPARTMENT_OTHER): Payer: Self-pay

## 2019-06-21 ENCOUNTER — Encounter (HOSPITAL_BASED_OUTPATIENT_CLINIC_OR_DEPARTMENT_OTHER): Payer: Medicare Other | Attending: Internal Medicine

## 2019-06-21 DIAGNOSIS — D571 Sickle-cell disease without crisis: Secondary | ICD-10-CM | POA: Diagnosis not present

## 2019-06-21 DIAGNOSIS — E1142 Type 2 diabetes mellitus with diabetic polyneuropathy: Secondary | ICD-10-CM | POA: Insufficient documentation

## 2019-06-21 DIAGNOSIS — I2510 Atherosclerotic heart disease of native coronary artery without angina pectoris: Secondary | ICD-10-CM | POA: Diagnosis not present

## 2019-06-21 DIAGNOSIS — E11610 Type 2 diabetes mellitus with diabetic neuropathic arthropathy: Secondary | ICD-10-CM | POA: Diagnosis not present

## 2019-06-21 DIAGNOSIS — E11621 Type 2 diabetes mellitus with foot ulcer: Secondary | ICD-10-CM | POA: Diagnosis not present

## 2019-06-21 DIAGNOSIS — L97523 Non-pressure chronic ulcer of other part of left foot with necrosis of muscle: Secondary | ICD-10-CM | POA: Diagnosis not present

## 2019-06-21 DIAGNOSIS — E1151 Type 2 diabetes mellitus with diabetic peripheral angiopathy without gangrene: Secondary | ICD-10-CM | POA: Insufficient documentation

## 2019-06-21 DIAGNOSIS — I10 Essential (primary) hypertension: Secondary | ICD-10-CM | POA: Insufficient documentation

## 2019-07-02 MED FILL — MEGESTROL 40 MG TABLET: 40 | 30 days supply | Qty: 30 | Fill #0

## 2019-07-04 DIAGNOSIS — E11621 Type 2 diabetes mellitus with foot ulcer: Secondary | ICD-10-CM | POA: Diagnosis not present

## 2019-07-12 DIAGNOSIS — E11621 Type 2 diabetes mellitus with foot ulcer: Secondary | ICD-10-CM | POA: Diagnosis not present

## 2019-07-19 ENCOUNTER — Encounter (HOSPITAL_BASED_OUTPATIENT_CLINIC_OR_DEPARTMENT_OTHER): Payer: Medicare Other | Attending: Internal Medicine

## 2019-07-19 DIAGNOSIS — I2510 Atherosclerotic heart disease of native coronary artery without angina pectoris: Secondary | ICD-10-CM | POA: Insufficient documentation

## 2019-07-19 DIAGNOSIS — I10 Essential (primary) hypertension: Secondary | ICD-10-CM | POA: Insufficient documentation

## 2019-07-19 DIAGNOSIS — Z794 Long term (current) use of insulin: Secondary | ICD-10-CM | POA: Insufficient documentation

## 2019-07-19 DIAGNOSIS — M14672 Charcot's joint, left ankle and foot: Secondary | ICD-10-CM | POA: Insufficient documentation

## 2019-07-19 DIAGNOSIS — D571 Sickle-cell disease without crisis: Secondary | ICD-10-CM | POA: Insufficient documentation

## 2019-07-19 DIAGNOSIS — L97522 Non-pressure chronic ulcer of other part of left foot with fat layer exposed: Secondary | ICD-10-CM | POA: Insufficient documentation

## 2019-07-19 DIAGNOSIS — E1142 Type 2 diabetes mellitus with diabetic polyneuropathy: Secondary | ICD-10-CM | POA: Insufficient documentation

## 2019-07-19 DIAGNOSIS — E1151 Type 2 diabetes mellitus with diabetic peripheral angiopathy without gangrene: Secondary | ICD-10-CM | POA: Insufficient documentation

## 2019-07-19 DIAGNOSIS — E11621 Type 2 diabetes mellitus with foot ulcer: Secondary | ICD-10-CM | POA: Insufficient documentation

## 2019-08-05 ENCOUNTER — Ambulatory Visit: Payer: Medicare Other

## 2019-08-07 ENCOUNTER — Ambulatory Visit: Payer: Medicare Other

## 2019-08-09 DIAGNOSIS — L97522 Non-pressure chronic ulcer of other part of left foot with fat layer exposed: Secondary | ICD-10-CM | POA: Diagnosis not present

## 2019-08-09 DIAGNOSIS — I10 Essential (primary) hypertension: Secondary | ICD-10-CM | POA: Diagnosis not present

## 2019-08-09 DIAGNOSIS — E1142 Type 2 diabetes mellitus with diabetic polyneuropathy: Secondary | ICD-10-CM | POA: Diagnosis not present

## 2019-08-09 DIAGNOSIS — E11621 Type 2 diabetes mellitus with foot ulcer: Secondary | ICD-10-CM | POA: Diagnosis not present

## 2019-08-09 DIAGNOSIS — Z794 Long term (current) use of insulin: Secondary | ICD-10-CM | POA: Diagnosis not present

## 2019-08-09 DIAGNOSIS — E1151 Type 2 diabetes mellitus with diabetic peripheral angiopathy without gangrene: Secondary | ICD-10-CM | POA: Diagnosis not present

## 2019-08-09 DIAGNOSIS — D571 Sickle-cell disease without crisis: Secondary | ICD-10-CM | POA: Diagnosis not present

## 2019-08-09 DIAGNOSIS — I2510 Atherosclerotic heart disease of native coronary artery without angina pectoris: Secondary | ICD-10-CM | POA: Diagnosis not present

## 2019-08-09 DIAGNOSIS — M14672 Charcot's joint, left ankle and foot: Secondary | ICD-10-CM | POA: Diagnosis not present

## 2019-08-20 ENCOUNTER — Other Ambulatory Visit: Payer: Self-pay | Admitting: *Deleted

## 2019-08-20 DIAGNOSIS — N939 Abnormal uterine and vaginal bleeding, unspecified: Secondary | ICD-10-CM

## 2019-08-20 MED ORDER — MEGESTROL ACETATE 40 MG PO TABS: 40.0000 mg | ORAL_TABLET | Freq: Every day | ORAL | 6 refills | Status: DC

## 2019-08-20 MED FILL — NOVOLOG FLEXPEN SYRINGE: 100 | 63 days supply | Qty: 15 | Fill #1

## 2019-08-20 MED FILL — LANTUS 100 UNITS/ML VIAL: 100 | 28 days supply | Qty: 10 | Fill #0

## 2019-08-20 MED FILL — UNIFINE PENTIPS 32GX5/32: 32G X 4 MM | 90 days supply | Qty: 300 | Fill #0

## 2019-08-20 NOTE — Telephone Encounter (Signed)
Received fax for refill for megace- will forward to provider. rx written 11/26/18, last fill 07/02/19 Linda,RN

## 2019-08-23 ENCOUNTER — Encounter (HOSPITAL_BASED_OUTPATIENT_CLINIC_OR_DEPARTMENT_OTHER): Payer: Medicare Other | Attending: Internal Medicine | Admitting: Internal Medicine

## 2019-08-23 ENCOUNTER — Other Ambulatory Visit: Payer: Self-pay

## 2019-08-23 DIAGNOSIS — E1151 Type 2 diabetes mellitus with diabetic peripheral angiopathy without gangrene: Secondary | ICD-10-CM | POA: Diagnosis not present

## 2019-08-23 DIAGNOSIS — I2510 Atherosclerotic heart disease of native coronary artery without angina pectoris: Secondary | ICD-10-CM | POA: Insufficient documentation

## 2019-08-23 DIAGNOSIS — E11610 Type 2 diabetes mellitus with diabetic neuropathic arthropathy: Secondary | ICD-10-CM | POA: Diagnosis not present

## 2019-08-23 DIAGNOSIS — L97522 Non-pressure chronic ulcer of other part of left foot with fat layer exposed: Secondary | ICD-10-CM | POA: Diagnosis not present

## 2019-08-23 DIAGNOSIS — D571 Sickle-cell disease without crisis: Secondary | ICD-10-CM | POA: Insufficient documentation

## 2019-08-23 DIAGNOSIS — I10 Essential (primary) hypertension: Secondary | ICD-10-CM | POA: Insufficient documentation

## 2019-08-23 DIAGNOSIS — E1142 Type 2 diabetes mellitus with diabetic polyneuropathy: Secondary | ICD-10-CM | POA: Diagnosis not present

## 2019-08-23 DIAGNOSIS — E11621 Type 2 diabetes mellitus with foot ulcer: Secondary | ICD-10-CM | POA: Diagnosis not present

## 2019-08-27 NOTE — Progress Notes (Signed)
DAIELLE, MELCHER ([id number]) Visit Report for 08/23/2019 Debridement Details Patient Name: Jane [name], Jane [name]. Date of Service: 08/23/2019 1:45 PM Medical Record [id number] Patient Account Number: [id number] Date of Birth/Sex: Treating RN: 27-Nov-1975 (43 y.o. Nancy Fetter Primary Care Provider: [name] Other Clinician: Referring Provider: Treating Provider/Extender:Robson, [name], Buck Mam in Treatment: 10 Debridement Performed for Wound #2 Left,Lateral Foot Assessment: Performed By: Physician Ricard Dillon., MD Debridement Type: Debridement Severity of Tissue Pre Fat layer exposed Debridement: Level of Consciousness (Pre- Awake and Alert procedure): Pre-procedure Verification/Time Out Taken: Yes - 14:38 Start Time: 14:38 Total Area Debrided (L x W): 0.4 (cm) x 0.4 (cm) = 0.16 (cm) Tissue and other material Non-Viable, Eschar, Skin: Epidermis debrided: Level: Skin/Epidermis Debridement Description: Selective/Open Wound Instrument: Curette Bleeding: Minimum Hemostasis Achieved: Pressure End Time: 14:39 Procedural Pain: 0 Post Procedural Pain: 0 Response to Treatment: Procedure was tolerated well Level of Consciousness Awake and Alert (Post-procedure): Post Debridement Measurements of Total Wound Length: (cm) 0.4 Width: (cm) 0.4 Depth: (cm) 0.1 Volume: (cm) 0.013 Character of Wound/Ulcer Post Improved Debridement: Severity of Tissue Post Debridement: Fat layer exposed Post Procedure Diagnosis Same as Pre-procedure Electronic Signature(s) Signed: 08/25/2019 10:27:00 AM By: Linton Ham MD Signed: 08/27/2019 5:52:09 PM By: Levan Hurst RN, BSN Entered By: Linton Ham on 08/23/2019 16:38:19 -------------------------------------------------------------------------------- HPI Details Patient Name: Date of Service: [name] 08/23/2019 1:45 PM Medical Record [id number] Patient Account Number:  [id number] Date of Birth/Sex: Treating RN: 31-May-1976 (43 y.o. Nancy Fetter Primary Care Provider: [name] Other Clinician: Referring Provider: Treating Provider/Extender:Robson, [name], Buck Mam in Treatment: 10 History of Present Illness HPI Description: 01/18/16 this is a patient who is a type II diabetic on insulin. She has a history of a wound in the left heel 2013. She states she was in a "accident" at that time. But does not have any hardware in her leg. Things were stable until early February. She started developing significant edema in her foot to which she applied Ace wraps while she was at work up. By her description the wrapping was uneven she developed swelling in her foot which started as a blister on her dorsal left ankle. She apparently got put on antibiotics and then ended up in the hospital due to GI issues with clindamycin at this point I don't have that information. She is been followed by Dr. Adele Barthel and vein and vascular. She had arterial studies that showed triphasic waves on the right monophasic waves on the left the. She had noncompressible vessels at the posterior tibial dorsalis pedis and digit on both sides. She had venous reflux studies that showed greater than 500 of milliseconds of reflux in the left greater saphenous vein from the saphenofemoral junction to just below the knee. Dr. Lianne Moris last visit with the patient on 2/24 suggests either Profore or Unna boots. He did not feel that this would interfere with her arterial flow as her vessels are not compressible. He wants her to have a trial of wound care first and if that fails an aortogram and possibly venous evaluations on the left greater saphenous vein. 01/25/16; patient arrives today with tiny wounds on the dorsal left ankle. She has tremendous swelling in the left foot and I'm not really sure why. Her ankle seems to be stable she is complaining of pain. The Profore wrap fell  down from last week 02/01/16; her wound actually looks to be improved however she continues to have edema in the left foot and  ankle. This has persisted even with aggressive compression applied in our clinic. She is not really complaining of pain she gives a history of trauma to the leg however I don't see this listed in her problem list on [place] flank. She has been to see Dr. Bridgett Larsson of vascular surgery she has had 2 venous studies and arterial studies. Looking through [facility] when she appears to have chronic edema in the left leg 02/08/16 the patient went to see vascular surgery Dr. Bridgett Larsson last Friday. Her wrap was removed. I have referenced his note any seems to think things were improving and agreed that she has lymphedema. His statement was that she would need lifetime 30-40 mm compression. He also notes that the patient has PAD at least to some degree. In any case no compression was reapplied except an Ace wrap. The patient thinks that this is resulted in improvement. The patient also complains about weakness in her left foot and ankle. She does have peripheral neuropathy listed as separate problem from her type 2 diabetes. Note that she has seen Dr. Jannifer Franklin of neurology as recently as January. He felt that she had a a peripheral neuropathy probably diabetic. 02/15/16; wound measures smaller. 02/22/16; appears to be progressively decreasing in size. Lymphedema in her foot is also controlled 02/29/16 left anterior ankle progressively decreasing in size. Still significant lymphedema in her foot ADMISSION 06/14/2019 This is a now 43 year old woman who we last saw in this clinic almost 3-1/2 years ago. At that point she had an area on her left dorsal ankle. 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 She also has known PAD and for a while was followed by Dr. Donzetta Matters at vein and vascular. Last arterial studies were in 2015 she had  noncompressible ABIs even noncompressible TBI's waveforms were triphasic at the right and monophasic at the left. The patient tells me she has been dealing with an open foot wound at the base of her left fifth metatarsal laterally on the left foot for about 2 months. She apparently had an elevated sedimentation rate and C-reactive protein but an x-ray did not show osteomyelitis in this area. I do not specifically see this report. Her MRI that was done on 05/29/2019 showed extensive hardware arthrodesis surgical changes across the mid to hindfoot with adjacent susceptibility of artifact. There was no suspicious areas of bone edema to suggest osteomyelitis. She was felt to have a small collection of fluid along the plantar aspect of the fifth metatarsal base suggesting an abscess as well as fluid collection at the head of the fifth metatarsal. This was felt to represent a possible abscess. The MRI did not show osteomyelitis. The wound was debrided I do not believe that it was subject to any surgical procedure. She uses a cam boot on the left and a Crow walker on the right. I am not completely sure what she has been using on the wound itself. She did have a course of antibiotics although she is finished with this again I am not sure which antibiotic. Past medical history is extensive includes type 2 diabetes with peripheral neuropathy and a history of PAD. Last hemoglobin A1c was 8.2. She has a history of lymphedema, neuropathy, coronary artery disease status post CABG x1 hyperlipidemia, hypertension, history of MRSA. She had venous reflux studies in 2015 that showed reflux in the common femoral femoral and left greater saphenous vein. She is chronic kidney disease  on dialysis ABIs were again noncompressible in our clinic bilaterally 8/7-Patient returns at 1 week after being started on silver alginate dressings to the left lower extremity wound the culture was growing Klebsiella and patient has been  started on cefdinir which she started taking yesterday. She had significant amount of debridement last visit and the wound is looking a whole lot better this time 8/20; 2-week follow-up. The patient saw Dr. Evette Doffing 2 weeks ago. Apparently a culture showed positive Klebsiella and she got a prescription for cefdinir for 2 weeks although I do not see this culture result. She is taking the antibiotic. The wound on her left lateral foot is improved dimensions are better. Using silver alginate. She uses a fracture boot on the right foot and a cam walker on the left is very clear she is walking on the outside of her lateral foot probably secondary largely to Charcot deformities in the left ankle 8/28; the patient has a clean looking wound on the left lateral foot. Extensive debridement last week she uses a cam walker to offload this. Dimensions are smaller 9/25; left lateral foot smaller by about 50% in terms of surface area. No debridement is necessary today. We are using silver alginate. She offloads this in her cam walker 10/9; left lateral foot continues to improve. She is using a cam walker Electronic Signature(s) Signed: 08/25/2019 10:27:00 AM By: Linton Ham MD Entered By: Linton Ham on 08/23/2019 16:38:46 -------------------------------------------------------------------------------- Physical Exam Details Patient Name: Date of Service: [name] 08/23/2019 1:45 PM Medical Record [id number] Patient Account Number: [id number] Date of Birth/Sex: Treating RN: [birth year] (43 y.o. Nancy Fetter Primary Care Provider: [name] Other Clinician: Referring Provider: Treating Provider/Extender:Robson, [name], Buck Mam in Treatment: 10 Constitutional Patient is hypertensive.. Pulse regular and within target range for patient.Marland Kitchen Respirations regular, non-labored and within target range.. Temperature is normal and within the target range for the  patient.Marland Kitchen Appears in no distress. Eyes Conjunctivae clear. No discharge.no icterus. Respiratory work of breathing is normal. Cardiovascular Pedal pulses palpable and strong bilaterally.Marland Kitchen Psychiatric appears at normal baseline. Notes Wound exam; left lateral foot plantar aspect. Eschar around the circumference otherwise generally a healthy looking wound. Using a #3 curette I knocked this down to the level of the circumference of the wound Electronic Signature(s) Signed: 08/25/2019 10:27:00 AM By: Linton Ham MD Entered By: Linton Ham on 08/23/2019 16:41:28 -------------------------------------------------------------------------------- Physician Orders Details Patient Name: Date of Service: [name] 08/23/2019 1:45 PM Medical Record [id number] Patient Account Number: [id number] Date of Birth/Sex: Treating RN: 1976/04/04 (43 y.o. 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 Dressing Change Frequency Wound #2 Left,Lateral Foot Change Dressing every other day. Wound Cleansing Wound #2 Left,Lateral Foot May shower and wash wound with soap and water. Primary Wound Dressing Wound #2 Left,Lateral Foot Calcium Alginate with Silver Secondary Dressing Wound #2 Left,Lateral Foot Kerlix/Rolled Gauze Dry Gauze ABD pad Off-Loading Removable cast walker boot to: - per  orthopedics Electronic Signature(s) Signed: 08/25/2019 10:27:00 AM By: Linton Ham MD Signed: 08/27/2019 5:52:09 PM  By: Levan Hurst RN, BSN Entered By: Levan Hurst on 08/23/2019 14:42:00 -------------------------------------------------------------------------------- Problem List Details Patient Name: Date of Service: [name] 08/23/2019 1:45 PM Medical Record [id number] Patient Account Number: [id number] Date of Birth/Sex: Treating RN: [birth year] (43 y.o. Nancy Fetter Primary Care Provider: [name] Other Clinician: Referring Provider: Treating Provider/Extender:Robson, [name], Buck Mam in Treatment: 10 Active Problems ICD-10 Evaluated Encounter Code Description Active Date Today Diagnosis E11.621 Type 2 diabetes mellitus with foot ulcer 06/14/2019 No Yes L97.523 Non-pressure chronic ulcer of other part of left foot 06/14/2019 No Yes with necrosis of muscle E11.42 Type 2 diabetes mellitus with diabetic polyneuropathy 06/14/2019 No Yes E11.51 Type 2 diabetes mellitus with diabetic peripheral 06/14/2019 No Yes angiopathy without gangrene M14.672 Charcot's joint, left ankle and foot 06/14/2019 No Yes Inactive Problems Resolved Problems Electronic Signature(s) Signed: 08/25/2019 10:27:00 AM By: Linton Ham MD Entered By: Linton Ham on 08/23/2019 16:37:58 -------------------------------------------------------------------------------- Progress Note Details Patient Name: Date of Service: [name] 08/23/2019 1:45 PM Medical Record [id number] Patient Account Number: [id number] Date of Birth/Sex: Treating RN: 1976-09-27 (42 y.o. Nancy Fetter Primary Care Provider: [name] Other Clinician: Referring Provider: Treating Provider/Extender:Robson, [name], Buck Mam in Treatment: 10 Subjective History of Present Illness (HPI) 01/18/16 this is a patient who is a type II diabetic on  insulin. She has a history of a wound in the left heel 2013. She states she was in a "accident" at that time. But does not have any hardware in her leg. Things were stable until early February. She started developing significant edema in her foot to which she applied Ace wraps while she was at work up. By her description the wrapping was uneven she developed swelling in her foot which started as a blister on her dorsal left ankle. She apparently got put on antibiotics and then ended up in the hospital due to GI issues with clindamycin at this point I don't have that information. She is been followed by Dr. Adele Barthel and vein and vascular. She had arterial studies that showed triphasic waves on the right monophasic waves on the left the. She had noncompressible vessels at the posterior tibial dorsalis pedis and digit on both sides. She had venous reflux studies that showed greater than 500 of milliseconds of reflux in the left greater saphenous vein from the saphenofemoral junction to just below the knee. Dr. Lianne Moris last visit with the patient on 2/24 suggests either Profore or Unna boots. He did not feel that this would interfere with her arterial flow as her vessels are not compressible. He wants her to have a trial of wound care first and if that fails an aortogram and possibly venous evaluations on the left greater saphenous vein. 01/25/16; patient arrives today with tiny wounds on the dorsal left ankle. She has tremendous swelling in the left foot and I'm not really sure why. Her ankle seems to be stable she is complaining of pain. The Profore wrap fell down from last week 02/01/16; her wound actually looks to be improved however she continues to have edema in the left foot and ankle. This has persisted even with aggressive compression applied in our clinic. She is not really complaining of pain she gives a history of trauma to the leg however I don't see this listed in her problem list on  [place] flank. She has been to see Dr. Bridgett Larsson of vascular surgery she has had 2 venous studies and arterial studies. Looking through [facility] when she appears  to have chronic edema in the left leg 02/08/16 the patient went to see vascular surgery Dr. Bridgett Larsson last Friday. Her wrap was removed. I have referenced his note any seems to think things were improving and agreed that she has lymphedema. His statement was that she would need lifetime 30-40 mm compression. He also notes that the patient has PAD at least to some degree. In any case no compression was reapplied except an Ace wrap. The patient thinks that this is resulted in improvement. The patient also complains about weakness in her left foot and ankle. She does have peripheral neuropathy listed as separate problem from her type 2 diabetes. Note that she has seen Dr. Jannifer Franklin of neurology as recently as January. He felt that she had a a peripheral neuropathy probably diabetic. 02/15/16; wound measures smaller. 02/22/16; appears to be progressively decreasing in size. Lymphedema in her foot is also controlled 02/29/16 left anterior ankle progressively decreasing in size. Still significant lymphedema in her foot ADMISSION 06/14/2019 This is a now 43 year old woman who we last saw in this clinic almost 3-1/2 years ago. At that point she had an area on her left dorsal ankle. 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 She also has known PAD and for a while was followed by Dr. Donzetta Matters at vein and vascular. Last arterial studies were in 2015 she had noncompressible ABIs even noncompressible TBI's waveforms were triphasic at the right and monophasic at the left. The patient tells me she has been dealing with an open foot wound at the base of her left fifth metatarsal laterally on the left foot for about 2 months. She apparently had an elevated sedimentation rate and C-reactive protein but an  x-ray did not show osteomyelitis in this area. I do not specifically see this report. Her MRI that was done on 05/29/2019 showed extensive hardware arthrodesis surgical changes across the mid to hindfoot with adjacent susceptibility of artifact. There was no suspicious areas of bone edema to suggest osteomyelitis. She was felt to have a small collection of fluid along the plantar aspect of the fifth metatarsal base suggesting an abscess as well as fluid collection at the head of the fifth metatarsal. This was felt to represent a possible abscess. The MRI did not show osteomyelitis. The wound was debrided I do not believe that it was subject to any surgical procedure. She uses a cam boot on the left and a Crow walker on the right. I am not completely sure what she has been using on the wound itself. She did have a course of antibiotics although she is finished with this again I am not sure which antibiotic. Past medical history is extensive includes type 2 diabetes with peripheral neuropathy and a history of PAD. Last hemoglobin A1c was 8.2. She has a history of lymphedema, neuropathy, coronary artery disease status post CABG x1 hyperlipidemia, hypertension, history of MRSA. She had venous reflux studies in 2015 that showed reflux in the common femoral femoral and left greater saphenous vein. She is chronic kidney disease on dialysis ABIs were again noncompressible in our clinic bilaterally 8/7-Patient returns at 1 week after being started on silver alginate dressings to the left lower extremity wound the culture was growing Klebsiella and patient has been started on cefdinir which she started taking yesterday. She had significant amount of debridement last visit and the wound is looking a whole lot better this time 8/20; 2-week  follow-up. The patient saw Dr. Evette Doffing 2 weeks ago. Apparently a culture showed positive Klebsiella and she got a prescription for cefdinir for 2 weeks although I do not see  this culture result. She is taking the antibiotic. The wound on her left lateral foot is improved dimensions are better. Using silver alginate. She uses a fracture boot on the right foot and a cam walker on the left is very clear she is walking on the outside of her lateral foot probably secondary largely to Charcot deformities in the left ankle 8/28; the patient has a clean looking wound on the left lateral foot. Extensive debridement last week she uses a cam walker to offload this. Dimensions are smaller 9/25; left lateral foot smaller by about 50% in terms of surface area. No debridement is necessary today. We are using silver alginate. She offloads this in her cam walker 10/9; left lateral foot continues to improve. She is using a cam walker Objective Constitutional Patient is hypertensive.. Pulse regular and within target range for patient.Marland Kitchen Respirations regular, non-labored and within target range.. Temperature is normal and within the target range for the patient.Marland Kitchen Appears in no distress. Vitals Time Taken: 2:18 PM, Height: 65 in, Weight: 225 lbs, BMI: 37.4, Temperature: 98.3 F, Pulse: 89 bpm, Respiratory Rate: 18 breaths/min, Blood Pressure: 184/88 mmHg. Eyes Conjunctivae clear. No discharge.no icterus. Respiratory work of breathing is normal. Cardiovascular Pedal pulses palpable and strong bilaterally.Marland Kitchen Psychiatric appears at normal baseline. General Notes: Wound exam; left lateral foot plantar aspect. Eschar around the circumference otherwise generally a healthy looking wound. Using a #3 curette I knocked this down to the level of the circumference of the wound Integumentary (Hair, Skin) Wound #2 status is Open. Original cause of wound was Gradually Appeared. The wound is located on the Left,Lateral Foot. The wound measures 0.4cm length x 0.4cm width x 0.1cm depth; 0.126cm^2 area and 0.013cm^3 volume. There is Fat Layer (Subcutaneous Tissue) Exposed exposed. There is no  tunneling or undermining noted. There is a medium amount of serosanguineous drainage noted. The wound margin is distinct with the outline attached to the wound base. There is large (67-100%) pink granulation within the wound bed. There is no necrotic tissue within the wound bed. Assessment Active Problems ICD-10 Type 2 diabetes mellitus with foot ulcer Non-pressure chronic ulcer of other part of left foot with necrosis of muscle Type 2 diabetes mellitus with diabetic polyneuropathy Type 2 diabetes mellitus with diabetic peripheral angiopathy without gangrene Charcot's joint, left ankle and foot Procedures Wound #2 Pre-procedure diagnosis of Wound #2 is a Diabetic Wound/Ulcer of the Lower Extremity located on the Left,Lateral Foot .Severity of Tissue Pre Debridement is: Fat layer exposed. There was a Selective/Open Wound Skin/Epidermis Debridement with a total area of 0.16 sq cm performed by Ricard Dillon., MD. With the following instrument(s): Curette to remove Non-Viable tissue/material. Material removed includes Eschar and Skin: Epidermis and. No specimens were taken. A time out was conducted at 14:38, prior to the start of the procedure. A Minimum amount of bleeding was controlled with Pressure. The procedure was tolerated well with a pain level of 0 throughout and a pain level of 0 following the procedure. Post Debridement Measurements: 0.4cm length x 0.4cm width x 0.1cm depth; 0.013cm^3 volume. Character of Wound/Ulcer Post Debridement is improved. Severity of Tissue Post Debridement is: Fat layer exposed. Post procedure Diagnosis Wound #2: Same as Pre-Procedure Plan Follow-up Appointments: Return Appointment in 2 weeks. Dressing Change Frequency: Wound #2 Left,Lateral Foot: Change Dressing every  other day. Wound Cleansing: Wound #2 Left,Lateral Foot: May shower and wash wound with soap and water. Primary Wound Dressing: Wound #2 Left,Lateral Foot: Calcium Alginate with  Silver Secondary Dressing: Wound #2 Left,Lateral Foot: Kerlix/Rolled Gauze Dry Gauze ABD pad Off-Loading: Removable cast walker boot to: - per orthopedics 1. Continue with silver alginate. 2. Debridement done to free the edges of the wound Electronic Signature(s) Signed: 08/25/2019 10:27:00 AM By: Linton Ham MD Entered By: Linton Ham on 08/23/2019 16:42:03 -------------------------------------------------------------------------------- SuperBill Details Patient Name: Date of Service: [name] 08/23/2019 Medical Record [id number] Patient Account Number: [id number] Date of Birth/Sex: Treating RN: 13-Jun-1976 (43 y.o. 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 Charcot's joint, left ankle and foot Facility Procedures CPT4 Code Description: [id number] - DEBRIDE WOUND 1ST 20 SQ CM OR < ICD-10 Diagnosis Description L97.523 Non-pressure chronic ulcer of other part of left foot wit Modifier: h necrosis of Quantity: 1 muscle Physician Procedures CPT4 Code Description: [id number] - WC PHYS DEBR WO ANESTH 20 SQ CM ICD-10 Diagnosis Description L97.523 Non-pressure chronic ulcer of other part of left foot with Modifier: necrosis of m Quantity: 1 uscle Electronic Signature(s) Signed: 08/25/2019 10:27:00 AM By: Linton Ham MD Entered By: Linton Ham on 08/23/2019 16:42:18

## 2019-08-30 ENCOUNTER — Other Ambulatory Visit: Payer: Self-pay | Admitting: *Deleted

## 2019-08-30 DIAGNOSIS — I2510 Atherosclerotic heart disease of native coronary artery without angina pectoris: Secondary | ICD-10-CM

## 2019-08-30 DIAGNOSIS — Z9861 Coronary angioplasty status: Secondary | ICD-10-CM

## 2019-08-30 MED ORDER — ATORVASTATIN CALCIUM 40 MG PO TABS: 40.0000 mg | ORAL_TABLET | Freq: Every day | ORAL | 3 refills | Status: DC

## 2019-08-30 MED FILL — ATORVASTATIN 40 MG TABLET: 40 | 90 days supply | Qty: 90 | Fill #0

## 2019-09-02 DIAGNOSIS — T7840XA Allergy, unspecified, initial encounter: Secondary | ICD-10-CM | POA: Insufficient documentation

## 2019-09-06 ENCOUNTER — Other Ambulatory Visit: Payer: Self-pay

## 2019-09-06 ENCOUNTER — Encounter (HOSPITAL_BASED_OUTPATIENT_CLINIC_OR_DEPARTMENT_OTHER): Payer: Medicare Other | Admitting: Internal Medicine

## 2019-09-06 DIAGNOSIS — E11621 Type 2 diabetes mellitus with foot ulcer: Secondary | ICD-10-CM | POA: Diagnosis not present

## 2019-09-06 NOTE — Progress Notes (Signed)
SULLY, MANZI ([id number]) Visit Report for 09/06/2019 Debridement Details Patient Name: Jane [name], Jane [name]. Date of Service: 09/06/2019 1:30 PM Medical Record Number:[id number] Patient Account Number: [id number] Date of Birth/Sex: Treating RN: 1976-10-26 (43 y.o. Nancy Fetter Primary Care Provider: [name] Other Clinician: Referring Provider: Treating Provider/Extender:Robson, [name], Buck Mam in Treatment: 12 Debridement Performed for Wound #2 Left,Lateral Foot Assessment: Performed By: Physician Ricard Dillon., MD Debridement Type: Debridement Severity of Tissue Pre Fat layer exposed Debridement: Level of Consciousness (Pre- Awake and Alert procedure): Pre-procedure Verification/Time Out Taken: Yes - 14:04 Start Time: 14:04 Total Area Debrided (L x W): 0.2 (cm) x 0.2 (cm) = 0.04 (cm) Tissue and other material Viable, Non-Viable, Callus, Subcutaneous debrided: Level: Skin/Subcutaneous Tissue Debridement Description: Excisional Instrument: Curette Bleeding: Minimum Hemostasis Achieved: Pressure End Time: 14:06 Procedural Pain: 0 Post Procedural Pain: 0 Response to Treatment: Procedure was tolerated well Level of Consciousness Awake and Alert (Post-procedure): Post Debridement Measurements of Total Wound Length: (cm) 0.2 Width: (cm) 0.2 Depth: (cm) 0.1 Volume: (cm) 0.003 Character of Wound/Ulcer Post Improved Debridement: Severity of Tissue Post Debridement: Fat layer exposed Post Procedure Diagnosis Same as Pre-procedure Electronic Signature(s) Signed: 09/06/2019 5:59:58 PM By: Levan Hurst RN, BSN Signed: 09/06/2019 6:06:00 PM By: Linton Ham MD Entered By: Linton Ham on 09/06/2019 15:31:54 -------------------------------------------------------------------------------- HPI Details Patient Name: Jane [name] Date of Service: 09/06/2019 1:30 PM Medical Record Number:[id number] Patient Account Number:  [id number] Date of Birth/Sex: Treating RN: July 05, 1976 (43 y.o. Nancy Fetter Primary Care Provider: [name] Other Clinician: Referring Provider: Treating Provider/Extender:Robson, [name], Buck Mam in Treatment: 12 History of Present Illness HPI Description: 01/18/16 this is a patient who is a type II diabetic on insulin. She has a history of a wound in the left heel 2013. She states she was in a "accident" at that time. But does not have any hardware in her leg. Things were stable until early February. She started developing significant edema in her foot to which she applied Ace wraps while she was at work up. By her description the wrapping was uneven she developed swelling in her foot which started as a blister on her dorsal left ankle. She apparently got put on antibiotics and then ended up in the hospital due to GI issues with clindamycin at this point I don't have that information. She is been followed by Dr. Adele Barthel and vein and vascular. She had arterial studies that showed triphasic waves on the right monophasic waves on the left the. She had noncompressible vessels at the posterior tibial dorsalis pedis and digit on both sides. She had venous reflux studies that showed greater than 500 of milliseconds of reflux in the left greater saphenous vein from the saphenofemoral junction to just below the knee. Dr. Lianne Moris last visit with the patient on 2/24 suggests either Profore or Unna boots. He did not feel that this would interfere with her arterial flow as her vessels are not compressible. He wants her to have a trial of wound care first and if that fails an aortogram and possibly venous evaluations on the left greater saphenous vein. 01/25/16; patient arrives today with tiny wounds on the dorsal left ankle. She has tremendous swelling in the left foot and I'm not really sure why. Her ankle seems to be stable she is complaining of pain. The Profore wrap fell  down from last week 02/01/16; her wound actually looks to be improved however she continues to have edema in the left foot and  ankle. This has persisted even with aggressive compression applied in our clinic. She is not really complaining of pain she gives a history of trauma to the leg however I don't see this listed in her problem list on [place] flank. She has been to see Dr. Bridgett Larsson of vascular surgery she has had 2 venous studies and arterial studies. Looking through [facility] when she appears to have chronic edema in the left leg 02/08/16 the patient went to see vascular surgery Dr. Bridgett Larsson last Friday. Her wrap was removed. I have referenced his note any seems to think things were improving and agreed that she has lymphedema. His statement was that she would need lifetime 30-40 mm compression. He also notes that the patient has PAD at least to some degree. In any case no compression was reapplied except an Ace wrap. The patient thinks that this is resulted in improvement. The patient also complains about weakness in her left foot and ankle. She does have peripheral neuropathy listed as separate problem from her type 2 diabetes. Note that she has seen Dr. Jannifer Franklin of neurology as recently as January. He felt that she had a a peripheral neuropathy probably diabetic. 02/15/16; wound measures smaller. 02/22/16; appears to be progressively decreasing in size. Lymphedema in her foot is also controlled 02/29/16 left anterior ankle progressively decreasing in size. Still significant lymphedema in her foot ADMISSION 06/14/2019 This is a now 43 year old woman who we last saw in this clinic almost 3-1/2 years ago. At that point she had an area on her left dorsal ankle. 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 She also has known PAD and for a while was followed by Dr. Donzetta Matters at vein and vascular. Last arterial studies were in 2015 she had  noncompressible ABIs even noncompressible TBI's waveforms were triphasic at the right and monophasic at the left. The patient tells me she has been dealing with an open foot wound at the base of her left fifth metatarsal laterally on the left foot for about 2 months. She apparently had an elevated sedimentation rate and C-reactive protein but an x-ray did not show osteomyelitis in this area. I do not specifically see this report. Her MRI that was done on 05/29/2019 showed extensive hardware arthrodesis surgical changes across the mid to hindfoot with adjacent susceptibility of artifact. There was no suspicious areas of bone edema to suggest osteomyelitis. She was felt to have a small collection of fluid along the plantar aspect of the fifth metatarsal base suggesting an abscess as well as fluid collection at the head of the fifth metatarsal. This was felt to represent a possible abscess. The MRI did not show osteomyelitis. The wound was debrided I do not believe that it was subject to any surgical procedure. She uses a cam boot on the left and a Crow walker on the right. I am not completely sure what she has been using on the wound itself. She did have a course of antibiotics although she is finished with this again I am not sure which antibiotic. Past medical history is extensive includes type 2 diabetes with peripheral neuropathy and a history of PAD. Last hemoglobin A1c was 8.2. She has a history of lymphedema, neuropathy, coronary artery disease status post CABG x1 hyperlipidemia, hypertension, history of MRSA. She had venous reflux studies in 2015 that showed reflux in the common femoral femoral and left greater saphenous vein. She is chronic kidney disease  on dialysis ABIs were again noncompressible in our clinic bilaterally 8/7-Patient returns at 1 week after being started on silver alginate dressings to the left lower extremity wound the culture was growing Klebsiella and patient has been  started on cefdinir which she started taking yesterday. She had significant amount of debridement last visit and the wound is looking a whole lot better this time 8/20; 2-week follow-up. The patient saw Dr. Evette Doffing 2 weeks ago. Apparently a culture showed positive Klebsiella and she got a prescription for cefdinir for 2 weeks although I do not see this culture result. She is taking the antibiotic. The wound on her left lateral foot is improved dimensions are better. Using silver alginate. She uses a fracture boot on the right foot and a cam walker on the left is very clear she is walking on the outside of her lateral foot probably secondary largely to Charcot deformities in the left ankle 8/28; the patient has a clean looking wound on the left lateral foot. Extensive debridement last week she uses a cam walker to offload this. Dimensions are smaller 9/25; left lateral foot smaller by about 50% in terms of surface area. No debridement is necessary today. We are using silver alginate. She offloads this in her cam walker 10/9; left lateral foot continues to improve. She is using a cam walker 10/23 left lateral foot continues to improve. She comes in with callus over the area. We have been using silver alginate Electronic Signature(s) Signed: 09/06/2019 6:06:00 PM By: Linton Ham MD Entered By: Linton Ham on 09/06/2019 15:32:22 -------------------------------------------------------------------------------- Physical Exam Details Patient Name: Jane [name]. Date of Service: 09/06/2019 1:30 PM Medical Record Number:[id number] Patient Account Number: [id number] Date of Birth/Sex: Treating RN: 1976/02/15 (43 y.o. Nancy Fetter Primary Care Provider: [name] Other Clinician: Referring Provider: Treating Provider/Extender:Robson, [name], IULIA Weeks in Treatment: 12 Constitutional Sitting or standing Blood Pressure is within target range for patient.. Pulse  regular and within target range for patient.Marland Kitchen Respirations regular, non-labored and within target range.. Temperature is normal and within the target range for the patient.Marland Kitchen Appears in no distress. Notes Wound exam; left lateral foot plantar aspect. Thick eschar around the circumference which I removed with a #5 curette in the middle of this some debris over a shallow superficial otherwise healthy looking wound after debridement. No evidence of surrounding infection. She has a Charcot foot with perhaps subluxation of the bone in this area Electronic Signature(s) Signed: 09/06/2019 6:06:00 PM By: Linton Ham MD Entered By: Linton Ham on 09/06/2019 15:33:14 -------------------------------------------------------------------------------- Physician Orders Details Patient Name: Jane [name]. Date of Service: 09/06/2019 1:30 PM Medical Record Number:[id number] Patient Account Number: [id number] Date of Birth/Sex: Treating RN: 1976/09/18 (43 y.o. 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 Dressing Change Frequency Wound #2 Left,Lateral Foot Change Dressing every other day. Wound Cleansing Wound #2 Left,Lateral Foot May shower and wash wound with soap and water. Primary Wound Dressing Wound #2 Left,Lateral Foot Calcium Alginate with Silver Secondary Dressing Wound #2 Left,Lateral  Foot Kerlix/Rolled Gauze Dry  Gauze Off-Loading Removable cast walker boot to: - per orthopedics Electronic Signature(s) Signed: 09/06/2019 5:59:58 PM By: Levan Hurst RN, BSN Signed: 09/06/2019 6:06:00 PM By: Linton Ham MD Entered By: Levan Hurst on 09/06/2019 14:08:42 -------------------------------------------------------------------------------- Problem List Details Patient Name: Jane [name] Date of Service: 09/06/2019 1:30 PM Medical Record Number:[id number] Patient Account Number: [id number] Date of Birth/Sex: Treating RN: July 01, 1976 (43 y.o. Nancy Fetter Primary Care Provider: [name] Other Clinician: Referring Provider: Treating Provider/Extender:Robson, [name], Buck Mam in Treatment: 12 Active Problems ICD-10 Evaluated Encounter Code Description Active Date Today Diagnosis E11.621 Type 2 diabetes mellitus with foot ulcer 06/14/2019 No Yes L97.523 Non-pressure chronic ulcer of other part of left foot 06/14/2019 No Yes with necrosis of muscle E11.42 Type 2 diabetes mellitus with diabetic polyneuropathy 06/14/2019 No Yes E11.51 Type 2 diabetes mellitus with diabetic peripheral 06/14/2019 No Yes angiopathy without gangrene M14.672 Charcot's joint, left ankle and foot 06/14/2019 No Yes Inactive Problems Resolved Problems Electronic Signature(s) Signed: 09/06/2019 6:06:00 PM By: Linton Ham MD Entered By: Linton Ham on 09/06/2019 15:31:35 -------------------------------------------------------------------------------- Progress Note Details Patient Name: Jane [name]. Date of Service: 09/06/2019 1:30 PM Medical Record Number:[id number] Patient Account Number: [id number] Date of Birth/Sex: Treating RN: 01/10/1976 (43 y.o. Nancy Fetter Primary Care Provider: [name] Other Clinician: Referring Provider: Treating Provider/Extender:Robson, [name], Buck Mam in Treatment: 12 Subjective History  of Present Illness (HPI) 01/18/16 this is a patient who is a type II diabetic on insulin. She has a history of a wound in the left heel 2013. She states she was in a "accident" at that time. But does not have any hardware in her leg. Things were stable until early February. She started developing significant edema in her foot to which she applied Ace wraps while she was at work up. By her description the wrapping was uneven she developed swelling in her foot which started as a blister on her dorsal left ankle. She apparently got put on antibiotics and then ended up in the hospital due to GI issues with clindamycin at this point I don't have that information. She is been followed by Dr. Adele Barthel and vein and vascular. She had arterial studies that showed triphasic waves on the right monophasic waves on the left the. She had noncompressible vessels at the posterior tibial dorsalis pedis and digit on both sides. She had venous reflux studies that showed greater than 500 of milliseconds of reflux in the left greater saphenous vein from the saphenofemoral junction to just below the knee. Dr. Lianne Moris last visit with the patient on 2/24 suggests either Profore or Unna boots. He did not feel that this would interfere with her arterial flow as her vessels are not compressible. He wants her to have a trial of wound care first and if that fails an aortogram and possibly venous evaluations on the left greater saphenous vein. 01/25/16; patient arrives today with tiny wounds on the dorsal left ankle. She has tremendous swelling in the left foot and I'm not really sure why. Her ankle seems to be stable she is complaining of pain. The Profore wrap fell down from last week 02/01/16; her wound actually looks to be improved however she continues to have edema in the left foot and ankle. This has persisted even with aggressive compression applied in our clinic. She is not really complaining of pain she gives a history  of trauma to the leg however I don't see this listed in her problem list on [place] flank. She  has been to see Dr. Bridgett Larsson of vascular surgery she has had 2 venous studies and arterial studies. Looking through [facility] when she appears to have chronic edema in the left leg 02/08/16 the patient went to see vascular surgery Dr. Bridgett Larsson last Friday. Her wrap was removed. I have referenced his note any seems to think things were improving and agreed that she has lymphedema. His statement was that she would need lifetime 30-40 mm compression. He also notes that the patient has PAD at least to some degree. In any case no compression was reapplied except an Ace wrap. The patient thinks that this is resulted in improvement. The patient also complains about weakness in her left foot and ankle. She does have peripheral neuropathy listed as separate problem from her type 2 diabetes. Note that she has seen Dr. Jannifer Franklin of neurology as recently as January. He felt that she had a a peripheral neuropathy probably diabetic. 02/15/16; wound measures smaller. 02/22/16; appears to be progressively decreasing in size. Lymphedema in her foot is also controlled 02/29/16 left anterior ankle progressively decreasing in size. Still significant lymphedema in her foot ADMISSION 06/14/2019 This is a now 43 year old woman who we last saw in this clinic almost 3-1/2 years ago. At that point she had an area on her left dorsal ankle. 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 She also has known PAD and for a while was followed by Dr. Donzetta Matters at vein and vascular. Last arterial studies were in 2015 she had noncompressible ABIs even noncompressible TBI's waveforms were triphasic at the right and monophasic at the left. The patient tells me she has been dealing with an open foot wound at the base of her left fifth metatarsal laterally on the left foot for about 2 months. She  apparently had an elevated sedimentation rate and C-reactive protein but an x-ray did not show osteomyelitis in this area. I do not specifically see this report. Her MRI that was done on 05/29/2019 showed extensive hardware arthrodesis surgical changes across the mid to hindfoot with adjacent susceptibility of artifact. There was no suspicious areas of bone edema to suggest osteomyelitis. She was felt to have a small collection of fluid along the plantar aspect of the fifth metatarsal base suggesting an abscess as well as fluid collection at the head of the fifth metatarsal. This was felt to represent a possible abscess. The MRI did not show osteomyelitis. The wound was debrided I do not believe that it was subject to any surgical procedure. She uses a cam boot on the left and a Crow walker on the right. I am not completely sure what she has been using on the wound itself. She did have a course of antibiotics although she is finished with this again I am not sure which antibiotic. Past medical history is extensive includes type 2 diabetes with peripheral neuropathy and a history of PAD. Last hemoglobin A1c was 8.2. She has a history of lymphedema, neuropathy, coronary artery disease status post CABG x1 hyperlipidemia, hypertension, history of MRSA. She had venous reflux studies in 2015 that showed reflux in the common femoral femoral and left greater saphenous vein. She is chronic kidney disease on dialysis ABIs were again noncompressible in our clinic bilaterally 8/7-Patient returns at 1 week after being started on silver alginate dressings to the left lower extremity wound the culture was growing Klebsiella and patient has been started on cefdinir which she  started taking yesterday. She had significant amount of debridement last visit and the wound is looking a whole lot better this time 8/20; 2-week follow-up. The patient saw Dr. Evette Doffing 2 weeks ago. Apparently a culture showed positive  Klebsiella and she got a prescription for cefdinir for 2 weeks although I do not see this culture result. She is taking the antibiotic. The wound on her left lateral foot is improved dimensions are better. Using silver alginate. She uses a fracture boot on the right foot and a cam walker on the left is very clear she is walking on the outside of her lateral foot probably secondary largely to Charcot deformities in the left ankle 8/28; the patient has a clean looking wound on the left lateral foot. Extensive debridement last week she uses a cam walker to offload this. Dimensions are smaller 9/25; left lateral foot smaller by about 50% in terms of surface area. No debridement is necessary today. We are using silver alginate. She offloads this in her cam walker 10/9; left lateral foot continues to improve. She is using a cam walker 10/23 left lateral foot continues to improve. She comes in with callus over the area. We have been using silver alginate Objective Constitutional Sitting or standing Blood Pressure is within target range for patient.. Pulse regular and within target range for patient.Marland Kitchen Respirations regular, non-labored and within target range.. Temperature is normal and within the target range for the patient.Marland Kitchen Appears in no distress. Vitals Time Taken: 1:30 PM, Height: 65 in, Weight: 225 lbs, BMI: 37.4, Temperature: 98.4 F, Pulse: 99 bpm, Respiratory Rate: 18 breaths/min, Blood Pressure: 140/82 mmHg. General Notes: Wound exam; left lateral foot plantar aspect. Thick eschar around the circumference which I removed with a #5 curette in the middle of this some debris over a shallow superficial otherwise healthy looking wound after debridement. No evidence of surrounding infection. She has a Charcot foot with perhaps subluxation of the bone in this area Integumentary (Hair, Skin) Wound #2 status is Open. Original cause of wound was Gradually Appeared. The wound is located on  the Left,Lateral Foot. The wound measures 0.2cm length x 0.2cm width x 0.1cm depth; 0.031cm^2 area and 0.003cm^3 volume. There is Fat Layer (Subcutaneous Tissue) Exposed exposed. There is no tunneling or undermining noted. There is a small amount of serosanguineous drainage noted. The wound margin is distinct with the outline attached to the wound base. There is large (67-100%) red granulation within the wound bed. There is no necrotic tissue within the wound bed. Assessment Active Problems ICD-10 Type 2 diabetes mellitus with foot ulcer Non-pressure chronic ulcer of other part of left foot with necrosis of muscle Type 2 diabetes mellitus with diabetic polyneuropathy Type 2 diabetes mellitus with diabetic peripheral angiopathy without gangrene Charcot's joint, left ankle and foot Procedures Wound #2 Pre-procedure diagnosis of Wound #2 is a Diabetic Wound/Ulcer of the Lower Extremity located on the Left,Lateral Foot .Severity of Tissue Pre Debridement is: Fat layer exposed. There was a Excisional Skin/Subcutaneous Tissue Debridement with a total area of 0.04 sq cm performed by Ricard Dillon., MD. With the following instrument(s): Curette to remove Viable and Non-Viable tissue/material. Material removed includes Callus and Subcutaneous Tissue and. A time out was conducted at 14:04, prior to the start of the procedure. A Minimum amount of bleeding was controlled with Pressure. The procedure was tolerated well with a pain level of 0 throughout and a pain level of 0 following the procedure. Post Debridement Measurements: 0.2cm length x 0.2cm  width x 0.1cm depth; 0.003cm^3 volume. Character of Wound/Ulcer Post Debridement is improved. Severity of Tissue Post Debridement is: Fat layer exposed. Post procedure Diagnosis Wound #2: Same as Pre-Procedure Plan Follow-up Appointments: Return Appointment in 2 weeks. Dressing Change Frequency: Wound #2 Left,Lateral Foot: Change Dressing every  other day. Wound Cleansing: Wound #2 Left,Lateral Foot: May shower and wash wound with soap and water. Primary Wound Dressing: Wound #2 Left,Lateral Foot: Calcium Alginate with Silver Secondary Dressing: Wound #2 Left,Lateral Foot: Kerlix/Rolled Gauze Dry Gauze Off-Loading: Removable cast walker boot to: - per orthopedics 1. Continue with silver alginate, gauze and kerlix . She has a cam walker 2. I started to have a conversation with her about diabetic shoes she does not have these. I do not know what she plans to transition into. She has Charcot deformities especially on the left. Electronic Signature(s) Signed: 09/06/2019 6:06:00 PM By: Linton Ham MD Entered By: Linton Ham on 09/06/2019 15:34:29 -------------------------------------------------------------------------------- SuperBill Details Patient Name: Date of Service: Jane [name] 09/06/2019 Medical Record [id number] Patient Account Number: [id number] Date of Birth/Sex: Treating RN: 26-Aug-1976 (43 y.o. 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 Charcot's joint, left ankle and foot Facility Procedures CPT4 Code Description: 37543606 11042 - DEB SUBQ TISSUE 20 SQ CM/< ICD-10 Diagnosis Description L97.523 Non-pressure chronic ulcer of other part of left foot wi E11.621 Type 2 diabetes mellitus with foot ulcer Modifier: th necrosis of Quantity: 1 muscle Physician Procedures CPT4 Code Description: [id number] [id number] - WC PHYS SUBQ TISS 20  SQ CM ICD-10 Diagnosis Description L97.523 Non-pressure chronic ulcer of other part of left foot wi E11.621 Type 2 diabetes mellitus with foot ulcer Modifier: th necrosis of Quantity: 1 muscle Electronic Signature(s) Signed: 09/06/2019 6:06:00 PM By: Linton Ham MD Entered By: Linton Ham on 09/06/2019 15:34:47

## 2019-09-09 NOTE — Progress Notes (Addendum)
JESSICALYNN, DESHONG ([id number]) Visit Report for 09/06/2019 Arrival Information Details Patient Name: Date of Service: Jane [name], SERMENO 09/06/2019 1:30 PM Medical Record [id number] Patient Account Number: [id number] Date of Birth/Sex: Treating RN: 03/26/1976 (43 y.o. Clearnce Sorrel Primary Care Ardon Franklin: [name] Other Clinician: Referring Phillis Thackeray: Treating Devaun Hernandez/Extender:Robson, [name], Buck Mam in Treatment: 12 Visit Information History Since Last Visit Added or deleted any medications: No Patient Arrived: Ambulatory Any new allergies or adverse reactions: No Arrival Time: 13:31 Had a fall or experienced change in No Accompanied By: self activities of daily living that may affect Transfer Assistance: None risk of falls: Patient Requires Transmission-Based No Signs or symptoms of abuse/neglect since No Precautions: last visito Patient Has Alerts: No Hospitalized since last visit: No Implantable device outside of the clinic No excluding cellular tissue based products placed in the center since last visit: Has Dressing in Place as Prescribed: Yes Has Footwear/Offloading in Place as Yes Prescribed: Left: Other:surgical boot Pain Present Now: No Electronic Signature(s) Signed: 09/09/2019 5:21:50 PM By: Kela Millin Entered By: Kela Millin on 09/06/2019 13:32:43 -------------------------------------------------------------------------------- Encounter Discharge Information Details Patient Name: Date of Service: Jane [name] Mates 09/06/2019 1:30 PM Medical Record [id number] Patient Account Number: [id number] Date of Birth/Sex: Treating RN: 1976-07-11 (43 y.o. Nancy Fetter Primary Care Bassy Fetterly: [name] Other Clinician: Referring Shaunna Rosetti: Treating Jonhatan Hearty/Extender:Robson, [name], Buck Mam in Treatment: 12 Encounter Discharge Information Items Post Procedure Vitals Discharge  Condition: Stable Temperature (F): 98.4 Ambulatory Status: Ambulatory Pulse (bpm): 99 Discharge Destination: Home Respiratory Rate (breaths/min): 18 Transportation: Private Auto Blood Pressure (mmHg): 140/82 Accompanied By: alone Schedule Follow-up Appointment: Yes Clinical Summary of Care: Patient Declined Electronic Signature(s) Signed: 09/06/2019 5:59:58 PM By: Levan Hurst RN, BSN Entered By: Levan Hurst on 09/06/2019 17:51:30 -------------------------------------------------------------------------------- Lower Extremity Assessment Details Patient Name: Date of Service: Jane [name] Mates 09/06/2019 1:30 PM Medical Record [id number] Patient Account Number: [id number] Date of Birth/Sex: Treating RN: 11-15-1975 (43 y.o. Clearnce Sorrel Primary Care Eulalie Speights: [name] Other Clinician: Referring Elia Keenum: Treating Maudell Stanbrough/Extender:Robson, [name], IULIA Weeks in Treatment: 12 Edema Assessment Assessed: [Left: No] [Right: No] Edema: [Left: N] [Right: o] Calf Left: Right: Point of Measurement: 38 cm From Medial Instep 37 cm cm Ankle Left: Right: Point of Measurement: 12 cm From Medial Instep 24 cm cm Vascular Assessment Pulses: Dorsalis Pedis Palpable: [Left:Yes] Electronic Signature(s) Signed: 09/09/2019 5:21:50 PM By: Kela Millin Entered By: Kela Millin on 09/06/2019 13:34:17 -------------------------------------------------------------------------------- Multi Wound Chart Details Patient Name: Date of Service: Jane [name] Mates 09/06/2019 1:30 PM Medical Record [id number] Patient Account Number: [id number] Date of Birth/Sex: Treating RN: 09-30-1976 (43 y.o. Nancy Fetter Primary Care Shontia Gillooly: [name] Other Clinician: Referring Javaria Knapke: Treating Shabrea Weldin/Extender:Robson, [name], IULIA Weeks in Treatment: 12 Vital Signs Height(in): 65 Pulse(bpm): 99 Weight(lbs): 225 Blood  Pressure(mmHg): 140/82 Body Mass Index(BMI): 37 Temperature(F): 98.4 Respiratory 18 Rate(breaths/min): Photos: [2:No Photos] [N/A:N/A] Wound Location: [2:Left Foot - Lateral] [N/A:N/A] Wounding Event: [2:Gradually Appeared] [N/A:N/A] Primary Etiology: [2:Diabetic Wound/Ulcer of the N/A Lower Extremity] Comorbid History: [2:Cataracts, Glaucoma, Anemia, Hemophilia, Sickle Cell Disease, Asthma, Arrhythmia, Coronary Artery Disease, Hypertension, Type II Diabetes] [N/A:N/A] Date Acquired: [2:04/15/2019] [N/A:N/A] Weeks of Treatment: [2:12] [N/A:N/A] Wound Status: [2:Open] [N/A:N/A] Measurements L x W x D 0.2x0.2x0.1 [N/A:N/A] (cm) Area (cm) : [2:0.031] [N/A:N/A] Volume (cm) : [2:0.003] [N/A:N/A] % Reduction in Area: [2:99.40%] [N/A:N/A] % Reduction in Volume: 99.80% [N/A:N/A] Classification: [2:Grade 3] [N/A:N/A] Exudate Amount: [2:Small] [N/A:N/A] Exudate Type: [2:Serosanguineous] [N/A:N/A] Exudate Color: [2:red, brown] [N/A:N/A] Wound Margin: [2:Distinct, outline  attached N/A] Granulation Amount: [2:Large (67-100%)] [N/A:N/A] Granulation Quality: [2:Red] [N/A:N/A] Necrotic Amount: [2:None Present (0%)] [N/A:N/A] Exposed Structures: [2:Fat Layer (Subcutaneous N/A Tissue) Exposed: Yes Fascia: No Tendon: No Muscle: No Joint: No Bone: No] Epithelialization: [2:Large (67-100%)] [N/A:N/A] Debridement: [2:Debridement - Excisional] Pre-procedure [2:14:04] Verification/Time Out Taken: Tissue Debrided: [2:Callus, Subcutaneous] Level: [2:Skin/Subcutaneous Tissue] Debridement Area (sq cm):0.04 Instrument: [2:Curette] Bleeding: [2:Minimum] Hemostasis Achieved: [2:Pressure] Procedural Pain: [2:0] Post Procedural Pain: [2:0] Debridement Treatment Procedure was tolerated Response: [2:well] Post Debridement [2:0.2x0.2x0.1] Measurements L x W x D (cm) Post Debridement [2:0.003] Volume: (cm) Procedures Performed: Debridement Treatment Notes Electronic Signature(s) Signed: 09/06/2019  5:59:58 PM By: Levan Hurst RN, BSN Signed: 09/06/2019 6:06:00 PM By: Linton Ham MD Entered By: Linton Ham on 09/06/2019 15:31:43 -------------------------------------------------------------------------------- Multi-Disciplinary Care Plan Details Patient Name: Date of Service: Jane [name] Mates. 09/06/2019 1:30 PM Medical Record [id number] Patient Account Number: [id number] Date of Birth/Sex: Treating RN: [birth year] (43 y.o. Nancy Fetter Primary Care Simrit Gohlke: [name] Other Clinician: Referring Eola Waldrep: Treating Marquavious Nazar/Extender:Robson, [name], Buck Mam in Treatment: 12 Active Inactive Nutrition Nursing Diagnoses: Impaired glucose control: actual or potential Potential for alteratiion in Nutrition/Potential for imbalanced nutrition Goals: Patient/caregiver will maintain therapeutic glucose control Date Initiated: 06/14/2019 Target Resolution Date: 09/06/2019 Goal Status: Active Interventions: Assess HgA1c results as ordered upon admission and as needed Assess patient nutrition upon admission and as needed per policy Treatment Activities: Patient referred to Primary Care Physician for further nutritional evaluation : 06/14/2019 Notes: Wound/Skin Impairment Nursing Diagnoses: Impaired tissue integrity Knowledge deficit related to ulceration/compromised skin integrity Goals: Patient/caregiver will verbalize understanding of skin care regimen Date Initiated: 06/14/2019 Date Inactivated: 07/04/2019 Target Resolution Date: 07/12/2019 Goal Status: Met Ulcer/skin breakdown will have a volume reduction of 30% by week 4 Date Initiated: 06/14/2019 Date Inactivated: 08/09/2019 Target Resolution Date: 07/12/2019 Goal Status: Met Ulcer/skin breakdown will have a volume reduction of 80% by week 12 Date Initiated: 08/09/2019 Target Resolution Date: 09/06/2019 Goal Status: Active Interventions: Assess patient/caregiver ability to obtain  necessary supplies Assess patient/caregiver ability to perform ulcer/skin care regimen upon admission and as needed Assess ulceration(s) every visit Provide education on ulcer and skin care Treatment Activities: Skin care regimen initiated : 06/14/2019 Topical wound management initiated : 06/14/2019 Notes: Electronic Signature(s) Signed: 09/06/2019 5:59:58 PM By: Levan Hurst RN, BSN Entered By: Levan Hurst on 09/06/2019 13:36:27 -------------------------------------------------------------------------------- Pain Assessment Details Patient Name: Date of Service: Jane [name] Mates 09/06/2019 1:30 PM Medical Record [id number] Patient Account Number: [id number] Date of Birth/Sex: Treating RN: [birth year] (43 y.o. Clearnce Sorrel Primary Care Mikaela Hilgeman: [name] Other Clinician: Referring Faylynn Stamos: Treating Kanda Deluna/Extender:Robson, [name], Buck Mam in Treatment: 12 Active Problems Location of Pain Severity and Description of Pain Patient Has Paino No Site Locations Pain Management and Medication Current Pain Management: Electronic Signature(s) Signed: 09/09/2019 5:21:50 PM By: Kela Millin Entered By: Kela Millin on 09/06/2019 13:33:15 -------------------------------------------------------------------------------- Patient/Caregiver Education Details [name] 10/23/2020andnbsp1:30 Patient Name: Date of Service: L. PM Medical Record Patient Account Number: [id number] Number: Treating RN: Levan Hurst Date of Birth/Gender: Apr 08, 1976 (43 y.o. F) Other Clinician: Primary Care Physician:BASARABA, IULIA Treating Linton Ham Referring Physician: Physician/Extender: Janine Limbo in Treatment: 12 Education Assessment Education Provided To: Patient Education Topics Provided Wound/Skin Impairment: Methods: Explain/Verbal Responses: State content correctly Electronic Signature(s) Signed:  09/06/2019 5:59:58 PM By: Levan Hurst RN, BSN Entered By: Levan Hurst on 09/06/2019 13:36:41 -------------------------------------------------------------------------------- Wound Assessment Details Patient Name: Date of Service: Jane [name] Mates 09/06/2019 1:30 PM Medical Record [id number] Patient Account Number: [id number]  Date of Birth/Sex: Treating RN: 02-12-1976 (43 y.o. Clearnce Sorrel Primary Care Delois Silvester: [name] Other Clinician: Referring Manasvi Dickard: Treating Artyom Stencel/Extender:Robson, [name], IULIA Weeks in Treatment: 12 Wound Status Wound Number: 2 Primary Diabetic Wound/Ulcer of the Lower Extremity Etiology: Wound Location: Left Foot - Lateral Wound Open Wounding Event: Gradually Appeared Status: Date Acquired: 04/15/2019 Comorbid Cataracts, Glaucoma, Anemia, Hemophilia, Weeks Of Treatment: 12 History: Sickle Cell Disease, Asthma, Arrhythmia, Clustered Wound: No Coronary Artery Disease, Hypertension, Type II Diabetes Photos Wound Measurements Length: (cm) 0.2 % Reduction i Width: (cm) 0.2 % Reduction i Depth: (cm) 0.1 Epithelializa Area: (cm) 0.031 Tunneling: Volume: (cm) 0.003 Undermining: Wound Description Classification: Grade 3 Foul Odor Aft Wound Margin: Distinct, outline attached Slough/Fibrin Exudate Amount: Small Exudate Type: Serosanguineous Exudate Color: red, brown Wound Bed Granulation Amount: Large (67-100%) Granulation Quality: Red Fascia Expose Necrotic Amount: None Present (0%) Fat Layer (Su Tendon Expose Muscle Expose Joint Exposed Bone Exposed: Treatment Notes Wound #2 (Left, Lateral Foot) 1. Cleanse With Wound Cleanser 3. Primary Dressing Applied Calcium Alginate Ag 4. Secondary Dressing Dry Gauze Roll Gauze 5. Secured With Tape 7. Footwear/Offloading device applied Removable Cast Walker/Walking Boot er Cleansing: No o No Exposed Structure d: No bcutaneous Tissue) Exposed: Yes d:  No d: No : No No n Area: 99.4% n Volume: 99.8% tion: Large (67-100%) No No Notes stretch net Electronic Signature(s) Signed: 09/10/2019 3:44:28 PM By: Mikeal Hawthorne EMT/HBOT Signed: 09/10/2019 5:30:52 PM By: Kela Millin Previous Signature: 09/06/2019 5:59:58 PM Version By: Levan Hurst RN, BSN Previous Signature: 09/09/2019 5:21:50 PM Version By: Kela Millin Entered By: Mikeal Hawthorne on 09/10/2019 10:23:09 -------------------------------------------------------------------------------- [place] Details Patient Name: Date of Service: Jane [name] Mates. 09/06/2019 1:30 PM Medical Record [id number] Patient Account Number: [id number] Date of Birth/Sex: Treating RN: [birth year] (43 y.o. Clearnce Sorrel Primary Care Lajuanda Penick: [name] Other Clinician: Referring Nahara Dona: Treating Tyronica Truxillo/Extender:Robson, [name], IULIA Weeks in Treatment: 12 Vital Signs Time Taken: 13:30 Temperature (F): 98.4 Height (in): 65 Pulse (bpm): 99 Weight (lbs): 225 Respiratory Rate (breaths/min): 18 Body Mass Index (BMI): 37.4 Blood Pressure (mmHg): 140/82 Reference Range: 80 - 120 mg / dl Electronic Signature(s) Signed: 09/09/2019 5:21:50 PM By: Kela Millin Entered By: Kela Millin on 09/06/2019 13:33:10

## 2019-09-20 ENCOUNTER — Encounter (HOSPITAL_BASED_OUTPATIENT_CLINIC_OR_DEPARTMENT_OTHER): Payer: Medicare Other | Admitting: Internal Medicine

## 2019-09-20 DIAGNOSIS — E46 Unspecified protein-calorie malnutrition: Secondary | ICD-10-CM | POA: Insufficient documentation

## 2019-09-27 ENCOUNTER — Ambulatory Visit (HOSPITAL_BASED_OUTPATIENT_CLINIC_OR_DEPARTMENT_OTHER): Payer: Medicare Other | Admitting: Internal Medicine

## 2019-09-30 ENCOUNTER — Ambulatory Visit: Payer: Medicare Other | Admitting: Cardiology

## 2019-10-04 ENCOUNTER — Other Ambulatory Visit: Payer: Self-pay

## 2019-10-04 ENCOUNTER — Encounter (HOSPITAL_BASED_OUTPATIENT_CLINIC_OR_DEPARTMENT_OTHER): Payer: Medicare Other | Attending: Internal Medicine | Admitting: Internal Medicine

## 2019-10-04 DIAGNOSIS — Z09 Encounter for follow-up examination after completed treatment for conditions other than malignant neoplasm: Secondary | ICD-10-CM | POA: Insufficient documentation

## 2019-10-04 DIAGNOSIS — E1142 Type 2 diabetes mellitus with diabetic polyneuropathy: Secondary | ICD-10-CM | POA: Diagnosis not present

## 2019-10-04 DIAGNOSIS — M14672 Charcot's joint, left ankle and foot: Secondary | ICD-10-CM | POA: Diagnosis not present

## 2019-10-04 DIAGNOSIS — L84 Corns and callosities: Secondary | ICD-10-CM | POA: Diagnosis not present

## 2019-10-04 DIAGNOSIS — I120 Hypertensive chronic kidney disease with stage 5 chronic kidney disease or end stage renal disease: Secondary | ICD-10-CM | POA: Diagnosis not present

## 2019-10-04 DIAGNOSIS — Z951 Presence of aortocoronary bypass graft: Secondary | ICD-10-CM | POA: Insufficient documentation

## 2019-10-04 DIAGNOSIS — Z992 Dependence on renal dialysis: Secondary | ICD-10-CM | POA: Diagnosis not present

## 2019-10-04 DIAGNOSIS — E11621 Type 2 diabetes mellitus with foot ulcer: Secondary | ICD-10-CM | POA: Diagnosis present

## 2019-10-04 DIAGNOSIS — N189 Chronic kidney disease, unspecified: Secondary | ICD-10-CM | POA: Insufficient documentation

## 2019-10-04 DIAGNOSIS — E1122 Type 2 diabetes mellitus with diabetic chronic kidney disease: Secondary | ICD-10-CM | POA: Diagnosis not present

## 2019-10-04 DIAGNOSIS — Z794 Long term (current) use of insulin: Secondary | ICD-10-CM | POA: Diagnosis not present

## 2019-10-04 DIAGNOSIS — I2510 Atherosclerotic heart disease of native coronary artery without angina pectoris: Secondary | ICD-10-CM | POA: Diagnosis not present

## 2019-10-04 DIAGNOSIS — Z8614 Personal history of Methicillin resistant Staphylococcus aureus infection: Secondary | ICD-10-CM | POA: Insufficient documentation

## 2019-10-07 NOTE — Progress Notes (Signed)
[name], [name] ([id number]) Visit Report for 10/04/2019 HPI Details Patient Name: Jane [name], Jane [name]. Date of Service: 10/04/2019 1:30 PM Medical Record Number:[id number] Patient Account Number: [id number] Date of Birth/Sex: Treating RN: November 14, 1976 (43 y.o. Jane [name] Primary Care Provider: [name] Other Clinician: Referring Provider: Treating Provider/Extender:Robson, [name], Buck Mam in Treatment: 16 History of Present Illness HPI Description: 01/18/16 this is a patient who is a type II diabetic on insulin. She has a history of a wound in the left heel 2013. She states she was in a "accident" at that time. But does not have any hardware in her leg. Things were stable until early February. She started developing significant edema in her foot to which she applied Ace wraps while she was at work up. By her description the wrapping was uneven she developed swelling in her foot which started as a blister on her dorsal left ankle. She apparently got put on antibiotics and then ended up in the hospital due to GI issues with clindamycin at this point I don't have that information. She is been followed by Dr. Adele Barthel and vein and vascular. She had arterial studies that showed triphasic waves on the right monophasic waves on the left the. She had noncompressible vessels at the posterior tibial dorsalis pedis and digit on both sides. She had venous reflux studies that showed greater than 500 of milliseconds of reflux in the left greater saphenous vein from the saphenofemoral junction to just below the knee. Dr. Lianne Moris last visit with the patient on 2/24 suggests either Profore or Unna boots. He did not feel that this would interfere with her arterial flow as her vessels are not compressible. He wants her to have a trial of wound care first and if that fails an aortogram and possibly venous evaluations on the left greater saphenous vein. 01/25/16; patient arrives  today with tiny wounds on the dorsal left ankle. She has tremendous swelling in the left foot and I'm not really sure why. Her ankle seems to be stable she is complaining of pain. The Profore wrap fell down from last week 02/01/16; her wound actually looks to be improved however she continues to have edema in the left foot and ankle. This has persisted even with aggressive compression applied in our clinic. She is not really complaining of pain she gives a history of trauma to the leg however I don't see this listed in her problem list on [place] flank. She has been to see Dr. Bridgett Larsson of vascular surgery she has had 2 venous studies and arterial studies. Looking through [facility] when she appears to have chronic edema in the left leg 02/08/16 the patient went to see vascular surgery Dr. Bridgett Larsson last Friday. Her wrap was removed. I have referenced his note any seems to think things were improving and agreed that she has lymphedema. His statement was that she would need lifetime 30-40 mm compression. He also notes that the patient has PAD at least to some degree. In any case no compression was reapplied except an Ace wrap. The patient thinks that this is resulted in improvement. The patient also complains about weakness in her left foot and ankle. She does have peripheral neuropathy listed as separate problem from her type 2 diabetes. Note that she has seen Dr. Jannifer Franklin of neurology as recently as January. He felt that she had a a peripheral neuropathy probably diabetic. 02/15/16; wound measures smaller. 02/22/16; appears to be progressively decreasing in size. Lymphedema in her foot is  also controlled 02/29/16 left anterior ankle progressively decreasing in size. Still significant lymphedema in her foot ADMISSION 06/14/2019 This is a now 43 year old woman who we last saw in this clinic almost 3-1/2 years ago. At that point she had an area on her left dorsal ankle. 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 She also has known PAD and for a while was followed by Dr. Donzetta Matters at vein and vascular. Last arterial studies were in 2015 she had noncompressible ABIs even noncompressible TBI's waveforms were triphasic at the right and monophasic at the left. The patient tells me she has been dealing with an open foot wound at the base of her left fifth metatarsal laterally on the left foot for about 2 months. She apparently had an elevated sedimentation rate and C-reactive protein but an x-ray did not show osteomyelitis in this area. I do not specifically see this report. Her MRI that was done on 05/29/2019 showed extensive hardware arthrodesis surgical changes across the mid to hindfoot with adjacent susceptibility of artifact. There was no suspicious areas of bone edema to suggest osteomyelitis. She was felt to have a small collection of fluid along the plantar aspect of the fifth metatarsal base suggesting an abscess as well as fluid collection at the head of the fifth metatarsal. This was felt to represent a possible abscess. The MRI did not show osteomyelitis. The wound was debrided I do not believe that it was subject to any surgical procedure. She uses a cam boot on the left and a Crow walker on the right. I am not completely sure what she has been using on the wound itself. She did have a course of antibiotics although she is finished with this again I am not sure which antibiotic. Past medical history is extensive includes type 2 diabetes with peripheral neuropathy and a history of PAD. Last hemoglobin A1c was 8.2. She has a history of lymphedema, neuropathy, coronary artery disease status post CABG x1 hyperlipidemia, hypertension, history of MRSA. She had venous reflux studies in 2015 that showed reflux in the common femoral femoral and left greater saphenous vein. She is chronic kidney disease on dialysis ABIs were again  noncompressible in our clinic bilaterally 8/7-Patient returns at 1 week after being started on silver alginate dressings to the left lower extremity wound the culture was growing Klebsiella and patient has been started on cefdinir which she started taking yesterday. She had significant amount of debridement last visit and the wound is looking a whole lot better this time 8/20; 2-week follow-up. The patient saw Dr. Evette Doffing 2 weeks ago. Apparently a culture showed positive Klebsiella and she got a prescription for cefdinir for 2 weeks although I do not see this culture result. She is taking the antibiotic. The wound on her left lateral foot is improved dimensions are better. Using silver alginate. She uses a fracture boot on the right foot and a cam walker on the left is very clear she is walking on the outside of her lateral foot probably secondary largely to Charcot deformities in the left ankle 8/28; the patient has a clean looking wound on the left lateral foot. Extensive debridement last week she uses a cam walker to offload this. Dimensions are smaller 9/25; left lateral foot smaller by about 50% in terms of surface area. No debridement is necessary today. We are using silver alginate. She offloads this in her cam walker 10/9; left  lateral foot continues to improve. She is using a cam walker 10/23 left lateral foot continues to improve. She comes in with callus over the area. We have been using silver alginate 11/20; left lateral foot is closed some light surface callus no open wound. There is swelling in this area. I suspect she walks on the outer part of her foot. She will need to offload this in order to have this state adherent Electronic Signature(s) Signed: 10/04/2019 6:05:38 PM By: Linton Ham MD Entered By: Linton Ham on 10/04/2019 14:04:23 -------------------------------------------------------------------------------- Physical Exam Details Patient Name: Jane [name]. Date of Service: 10/04/2019 1:30 PM Medical Record Number:[id number] Patient Account Number: [id number] Date of Birth/Sex: Treating RN: [birth year] (43 y.o. Jane [name] Primary Care Provider: [name] Other Clinician: Referring Provider: Treating Provider/Extender:Robson, [name], IULIA Weeks in Treatment: 16 Constitutional Sitting or standing Blood Pressure is within target range for patient.. Pulse regular and within target range for patient.Marland Kitchen Respirations regular, non-labored and within target range.. Temperature is normal and within the target range for the patient.Marland Kitchen Appears in no distress. Notes Wound exam; left lateral plantar foot. Area is closed. There is some debris but I did not think any debridement was necessary. No surrounding erythema. There is a soft tissue swelling in this area. She has Charcot deformity bilaterally. I wonder if she walks on the outside of her foot as well. There is no erythema no evidence of infection Electronic Signature(s) Signed: 10/04/2019 6:05:38 PM By: Linton Ham MD Entered By: Linton Ham on 10/04/2019 14:05:21 -------------------------------------------------------------------------------- Physician Orders Details Patient Name: Jane [name]. Date of Service: 10/04/2019 1:30 PM Medical Record Number:[id number] Patient Account Number: [id number] Date of Birth/Sex: Treating RN: 01/26/1976 (43 y.o. Jane [name] Primary Care Provider: [name] Other Clinician: Referring Provider: Treating Provider/Extender:Robson, Chrisandra Netters in Treatment: 51 Verbal / Phone Orders: No Diagnosis Coding Discharge From [facility] Services Discharge from [place] - Call if wound re-opens Skin Barriers/Peri-Wound Care Other: - moisturize skin at night Off-Loading Other: - cushion site to avoid re-opening. can use gauze or get callous pads/insoles Electronic Signature(s) Signed:  10/04/2019 6:05:38 PM By: Linton Ham MD Signed: 10/07/2019 2:17:56 PM By: Kela Millin Entered By: Kela Millin on 10/04/2019 13:50:46 -------------------------------------------------------------------------------- Problem List Details Patient Name: Jane [name]. Date of Service: 10/04/2019 1:30 PM Medical Record Number:[id number] Patient Account Number: [id number] Date of Birth/Sex: Treating RN: [birth year] (43 y.o. Hollie Salk, Shannon Primary Care Provider: [name] Other Clinician: Referring Provider: Treating Provider/Extender:Robson, [name], Buck Mam in Treatment: 16 Active Problems ICD-10 Evaluated Encounter Code Description Active Date Today Diagnosis E11.621 Type 2 diabetes mellitus with foot ulcer 06/14/2019 No Yes L97.523 Non-pressure chronic ulcer of other part of left foot 06/14/2019 No Yes with necrosis of muscle E11.42 Type 2 diabetes mellitus with diabetic polyneuropathy 06/14/2019 No Yes E11.51 Type 2 diabetes mellitus with diabetic peripheral 06/14/2019 No Yes angiopathy without gangrene M14.672 Charcot's joint, left ankle and foot 06/14/2019 No Yes Inactive Problems Resolved Problems Electronic Signature(s) Signed: 10/04/2019 6:05:38 PM By: Linton Ham MD Entered By: Linton Ham on 10/04/2019 14:03:32 -------------------------------------------------------------------------------- Progress Note Details Patient Name: Jane [name]. Date of Service: 10/04/2019 1:30 PM Medical Record Number:[id number] Patient Account Number: [id number] Date of Birth/Sex: Treating RN: 1976-07-01 (43 y.o. Jane [name] Primary Care Provider: [name] Other Clinician: Referring Provider: Treating Provider/Extender:Robson, [name], Buck Mam in Treatment: 16 Subjective History of Present Illness (HPI) 01/18/16 this is a patient who is a type II diabetic on  insulin. She has a history of a wound in the left  heel 2013. She states she was in a "accident" at that time. But does not have any hardware in her leg. Things were stable until early February. She started developing significant edema in her foot to which she applied Ace wraps while she was at work up. By her description the wrapping was uneven she developed swelling in her foot which started as a blister on her dorsal left ankle. She apparently got put on antibiotics and then ended up in the hospital due to GI issues with clindamycin at this point I don't have that information. She is been followed by Dr. Adele Barthel and vein and vascular. She had arterial studies that showed triphasic waves on the right monophasic waves on the left the. She had noncompressible vessels at the posterior tibial dorsalis pedis and digit on both sides. She had venous reflux studies that showed greater than 500 of milliseconds of reflux in the left greater saphenous vein from the saphenofemoral junction to just below the knee. Dr. Lianne Moris last visit with the patient on 2/24 suggests either Profore or Unna boots. He did not feel that this would interfere with her arterial flow as her vessels are not compressible. He wants her to have a trial of wound care first and if that fails an aortogram and possibly venous evaluations on the left greater saphenous vein. 01/25/16; patient arrives today with tiny wounds on the dorsal left ankle. She has tremendous swelling in the left foot and I'm not really sure why. Her ankle seems to be stable she is complaining of pain. The Profore wrap fell down from last week 02/01/16; her wound actually looks to be improved however she continues to have edema in the left foot and ankle. This has persisted even with aggressive compression applied in our clinic. She is not really complaining of pain she gives a history of trauma to the leg however I don't see this listed in her problem list on [employer] flank. She has been to see Dr. Bridgett Larsson of  vascular surgery she has had 2 venous studies and arterial studies. Looking through [facility] when she appears to have chronic edema in the left leg 02/08/16 the patient went to see vascular surgery Dr. Bridgett Larsson last Friday. Her wrap was removed. I have referenced his note any seems to think things were improving and agreed that she has lymphedema. His statement was that she would need lifetime 30-40 mm compression. He also notes that the patient has PAD at least to some degree. In any case no compression was reapplied except an Ace wrap. The patient thinks that this is resulted in improvement. The patient also complains about weakness in her left foot and ankle. She does have peripheral neuropathy listed as separate problem from her type 2 diabetes. Note that she has seen Dr. Jannifer Franklin of neurology as recently as January. He felt that she had a a peripheral neuropathy probably diabetic. 02/15/16; wound measures smaller. 02/22/16; appears to be progressively decreasing in size. Lymphedema in her foot is also controlled 02/29/16 left anterior ankle progressively decreasing in size. Still significant lymphedema in her foot ADMISSION 06/14/2019 This is a now 43 year old woman who we last saw in this clinic almost 3-1/2 years ago. At that point she had an area on her left dorsal ankle. 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 She also has known PAD and  for a while was followed by Dr. Donzetta Matters at vein and vascular. Last arterial studies were in 2015 she had noncompressible ABIs even noncompressible TBI's waveforms were triphasic at the right and monophasic at the left. The patient tells me she has been dealing with an open foot wound at the base of her left fifth metatarsal laterally on the left foot for about 2 months. She apparently had an elevated sedimentation rate and C-reactive protein but an x-ray did not show osteomyelitis in this area. I  do not specifically see this report. Her MRI that was done on 05/29/2019 showed extensive hardware arthrodesis surgical changes across the mid to hindfoot with adjacent susceptibility of artifact. There was no suspicious areas of bone edema to suggest osteomyelitis. She was felt to have a small collection of fluid along the plantar aspect of the fifth metatarsal base suggesting an abscess as well as fluid collection at the head of the fifth metatarsal. This was felt to represent a possible abscess. The MRI did not show osteomyelitis. The wound was debrided I do not believe that it was subject to any surgical procedure. She uses a cam boot on the left and a Crow walker on the right. I am not completely sure what she has been using on the wound itself. She did have a course of antibiotics although she is finished with this again I am not sure which antibiotic. Past medical history is extensive includes type 2 diabetes with peripheral neuropathy and a history of PAD. Last hemoglobin A1c was 8.2. She has a history of lymphedema, neuropathy, coronary artery disease status post CABG x1 hyperlipidemia, hypertension, history of MRSA. She had venous reflux studies in 2015 that showed reflux in the common femoral femoral and left greater saphenous vein. She is chronic kidney disease on dialysis ABIs were again noncompressible in our clinic bilaterally 8/7-Patient returns at 1 week after being started on silver alginate dressings to the left lower extremity wound the culture was growing Klebsiella and patient has been started on cefdinir which she started taking yesterday. She had significant amount of debridement last visit and the wound is looking a whole lot better this time 8/20; 2-week follow-up. The patient saw Dr. Evette Doffing 2 weeks ago. Apparently a culture showed positive Klebsiella and she got a prescription for cefdinir for 2 weeks although I do not see this culture result. She is taking  the antibiotic. The wound on her left lateral foot is improved dimensions are better. Using silver alginate. She uses a fracture boot on the right foot and a cam walker on the left is very clear she is walking on the outside of her lateral foot probably secondary largely to Charcot deformities in the left ankle 8/28; the patient has a clean looking wound on the left lateral foot. Extensive debridement last week she uses a cam walker to offload this. Dimensions are smaller 9/25; left lateral foot smaller by about 50% in terms of surface area. No debridement is necessary today. We are using silver alginate. She offloads this in her cam walker 10/9; left lateral foot continues to improve. She is using a cam walker 10/23 left lateral foot continues to improve. She comes in with callus over the area. We have been using silver alginate 11/20; left lateral foot is closed some light surface callus no open wound. There is swelling in this area. I suspect she walks on the outer part of her foot. She will need to offload this in order to have this state  adherent Objective Constitutional Sitting or standing Blood Pressure is within target range for patient.. Pulse regular and within target range for patient.Marland Kitchen Respirations regular, non-labored and within target range.. Temperature is normal and within the target range for the patient.Marland Kitchen Appears in no distress. Vitals Time Taken: 1:24 PM, Height: 65 in, Weight: 225 lbs, BMI: 37.4, Temperature: 98.8 F, Pulse: 96 bpm, Respiratory Rate: 16 breaths/min, Blood Pressure: 124/61 mmHg. General Notes: Wound exam; left lateral plantar foot. Area is closed. There is some debris but I did not think any debridement was necessary. No surrounding erythema. There is a soft tissue swelling in this area. She has Charcot deformity bilaterally. I wonder if she walks on the outside of her foot as well. There is no erythema no evidence of infection Integumentary (Hair,  Skin) Wound #2 status is Open. Original cause of wound was Gradually Appeared. The wound is located on the Left,Lateral Foot. The wound measures 0cm length x 0cm width x 0cm depth; 0cm^2 area and 0cm^3 volume. There is no tunneling or undermining noted. There is a none present amount of drainage noted. The wound margin is distinct with the outline attached to the wound base. There is no granulation within the wound bed. There is no necrotic tissue within the wound bed. Assessment Active Problems ICD-10 Type 2 diabetes mellitus with foot ulcer Non-pressure chronic ulcer of other part of left foot with necrosis of muscle Type 2 diabetes mellitus with diabetic polyneuropathy Type 2 diabetes mellitus with diabetic peripheral angiopathy without gangrene Charcot's joint, left ankle and foot Plan Discharge From [facility] Services: Discharge from [place] - Call if wound re-opens Skin Barriers/Peri-Wound Care: Other: - moisturize skin at night Off-Loading: Other: - cushion site to avoid re-opening. can use gauze or get callous pads/insoles 1. The patient can be discharged from the clinic 2. She does not have the resources for custom made diabetic shoes however I have recommended a broad running shoe with a Dr. Felicie Morn insole and to continually pad this area of her foot. I do not know what other alternatives might be available Electronic Signature(s) Signed: 10/04/2019 6:05:38 PM By: Linton Ham MD Entered By: Linton Ham on 10/04/2019 14:10:09 -------------------------------------------------------------------------------- SuperBill Details Patient Name: Date of Service: Jane [name] 10/04/2019 Medical Record [id number] Patient Account Number: [id number] Date of Birth/Sex: Treating RN: May 23, 1976 (43 y.o. Jane [name] Primary Care Provider: [name] Other Clinician: Referring Provider: Treating Provider/Extender:Robson, [name],  Buck Mam in Treatment: 16 Diagnosis Coding ICD-10 Codes Code Description E11.621 Type 2 diabetes mellitus with foot ulcer L97.523 Non-pressure chronic ulcer of other part of left foot with necrosis of muscle E11.42 Type 2 diabetes mellitus with diabetic polyneuropathy E11.51 Type 2 diabetes mellitus with diabetic peripheral angiopathy without gangrene M14.672 Charcot's joint, left ankle and foot Facility Procedures CPT4 Code: [id number] Description: 99213 - WOUND CARE VISIT-LEV 3 EST PT Modifier: Quantity: 1 Physician Procedures CPT4 Code Description: [id number] - WC PHYS LEVEL 2 - EST PT ICD-10 Diagnosis Description L97.523 Non-pressure chronic ulcer of other part of left foot wi E11.621 Type 2 diabetes mellitus with foot ulcer Modifier: th necrosis of m Quantity: 1 uscle Electronic Signature(s) Signed: 10/04/2019 6:05:38 PM By: Linton Ham MD Entered By: Linton Ham on 10/04/2019 14:10:26

## 2019-10-08 NOTE — Progress Notes (Signed)
Jane [name], Jane [name] ([id number]) Visit Report for 10/04/2019 Arrival Information Details Patient Name: Date of Service: Jane [name], Jane [name] 10/04/2019 1:30 PM Medical Record [id number] Patient Account Number: [id number] Date of Birth/Sex: Treating RN: 11-27-1975 (43 y.o. Nancy Fetter Primary Care Ismael Treptow: [name] Other Clinician: Referring Yacine Droz: Treating Lockie Bothun/Extender:Robson, [name], Buck Mam in Treatment: 16 Visit Information History Since Last Visit Added or deleted any medications: No Patient Arrived: Ambulatory Any new allergies or adverse No Arrival Time: 13:23 reactions: Accompanied By: alone Had a fall or experienced change No Transfer Assistance: None in Patient Identification Verified: Yes activities of daily living that may Secondary Verification Process Yes affect Completed: risk of falls: Patient Requires Transmission-Based No Signs or symptoms of No Precautions: abuse/neglect since last visito Patient Has Alerts: No Hospitalized since last visit: No Implantable device outside of the No clinic excluding cellular tissue based products placed in the center since last visit: Has Dressing in Place as Yes Prescribed: Has Footwear/Offloading in Place Yes as Prescribed: Left: Removable Cast Walker/Walking Boot Pain Present Now: No Electronic Signature(s) Signed: 10/07/2019 2:18:43 PM By: Levan Hurst RN, BSN Entered By: Levan Hurst on 10/04/2019 13:24:51 -------------------------------------------------------------------------------- Clinic Level of Care Assessment Details Patient Name: Date of Service: Jane [name] 10/04/2019 1:30 PM Medical Record [id number] Patient Account Number: [id number] Date of Birth/Sex: Treating RN: 07/28/1976 (43 y.o. Clearnce Sorrel Primary Care Dulcy Sida: [name] Other Clinician: Referring Brylon Brenning: Treating Kailia Starry/Extender:Robson,  [name], Buck Mam in Treatment: 16 Clinic Level of Care Assessment Items TOOL 4 Quantity Score X - Use when only an EandM is performed on FOLLOW-UP visit 1 0 ASSESSMENTS - Nursing Assessment / Reassessment X - Reassessment of Co-morbidities (includes updates in patient status) 1 10 X - Reassessment of Adherence to Treatment Plan 1 5 ASSESSMENTS - Wound and Skin Assessment / Reassessment X - Simple Wound Assessment / Reassessment - one wound 1 5 []  - Complex Wound Assessment / Reassessment - multiple wounds 0 []  - Dermatologic / Skin Assessment (not related to wound area) 0 ASSESSMENTS - Focused Assessment X - Circumferential Edema Measurements - multi extremities 1 5 []  - Nutritional Assessment / Counseling / Intervention 0 []  - Lower Extremity Assessment (monofilament, tuning fork, pulses) 0 []  - Peripheral Arterial Disease Assessment (using hand held doppler) 0 ASSESSMENTS - Ostomy and/or Continence Assessment and Care []  - Incontinence Assessment and Management 0 []  - Ostomy Care Assessment and Management (repouching, etc.) 0 PROCESS - Coordination of Care X - Simple Patient / Family Education for ongoing care 1 15 []  - Complex (extensive) Patient / Family Education for ongoing care 0 X - Staff obtains Programmer, systems, Records, Test Results / Process Orders 1 10 []  - Staff telephones HHA, Nursing Homes / Clarify orders / etc 0 []  - Routine Transfer to another Facility (non-emergent condition) 0 []  - Routine Hospital Admission (non-emergent condition) 0 []  - New Admissions / Biomedical engineer / Ordering NPWT, Apligraf, etc. 0 []  - Emergency Hospital Admission (emergent condition) 0 X - Simple Discharge Coordination 1 10 []  - Complex (extensive) Discharge Coordination 0 PROCESS - Special Needs []  - Pediatric / Minor Patient Management 0 []  - Isolation Patient Management 0 []  - Hearing / Language / Visual special needs 0 []  - Assessment of Community assistance  (transportation, D/C planning, etc.) 0 []  - Additional assistance / Altered mentation 0 []  - Support Surface(s) Assessment (bed, cushion, seat, etc.) 0 INTERVENTIONS - Wound Cleansing / Measurement X - Simple Wound Cleansing - one wound 1 5 []  -  Complex Wound Cleansing - multiple wounds 0 X - Wound Imaging (photographs - any number of wounds) 1 5 []  - Wound Tracing (instead of photographs) 0 X - Simple Wound Measurement - one wound 1 5 []  - Complex Wound Measurement - multiple wounds 0 INTERVENTIONS - Wound Dressings []  - Small Wound Dressing one or multiple wounds 0 []  - Medium Wound Dressing one or multiple wounds 0 []  - Large Wound Dressing one or multiple wounds 0 []  - Application of Medications - topical 0 []  - Application of Medications - injection 0 INTERVENTIONS - Miscellaneous []  - External ear exam 0 []  - Specimen Collection (cultures, biopsies, blood, body fluids, etc.) 0 []  - Specimen(s) / Culture(s) sent or taken to Lab for analysis 0 []  - Patient Transfer (multiple staff / Civil Service fast streamer / Similar devices) 0 []  - Simple Staple / Suture removal (25 or less) 0 []  - Complex Staple / Suture removal (26 or more) 0 []  - Hypo / Hyperglycemic Management (close monitor of Blood Glucose) 0 []  - Ankle / Brachial Index (ABI) - do not check if billed separately 0 X - Vital Signs 1 5 Has the patient been seen at the hospital within the last three years: Yes Total Score: 80 Level Of Care: New/Established - Level 3 Electronic Signature(s) Signed: 10/07/2019 2:17:56 PM By: Kela Millin Entered By: Kela Millin on 10/04/2019 13:54:28 -------------------------------------------------------------------------------- Lower Extremity Assessment Details Patient Name: Date of Service: Jane [name] 10/04/2019 1:30 PM Medical Record [id number] Patient Account Number: [id number] Date of Birth/Sex: Treating RN: 1976/05/28 (43 y.o. Nancy Fetter Primary Care Adaliah Hiegel:  [name] Other Clinician: Referring Brittnay Pigman: Treating Boden Stucky/Extender:Robson, [name], IULIA Weeks in Treatment: 16 Edema Assessment Assessed: [Left: No] [Right: No] Edema: [Left: N] [Right: o] Calf Left: Right: Point of Measurement: 38 cm From Medial Instep 37 cm cm Ankle Left: Right: Point of Measurement: 12 cm From Medial Instep 24 cm cm Vascular Assessment Pulses: Dorsalis Pedis Palpable: [Left:Yes] Electronic Signature(s) Signed: 10/07/2019 2:18:43 PM By: Levan Hurst RN, BSN Entered By: Levan Hurst on 10/04/2019 13:25:25 -------------------------------------------------------------------------------- Multi Wound Chart Details Patient Name: Date of Service: Jane [name] 10/04/2019 1:30 PM Medical Record [id number] Patient Account Number: [id number] Date of Birth/Sex: Treating RN: [birth year] (43 y.o. 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] Comorbid History: [2:Cataracts, Glaucoma, Anemia, Hemophilia, Sickle Cell Disease, Asthma, Arrhythmia, Coronary Artery Disease, Hypertension, Type II Diabetes] [N/A:N/A] Date Acquired: [2:04/15/2019] [N/A:N/A] Weeks of Treatment: [2:16] [N/A:N/A] Wound Status: [2:Open] [N/A:N/A] Measurements L x W x D 0x0x0 [N/A:N/A] (cm) Area (cm) : [2:0] [N/A:N/A] Volume (cm) : [2:0] [N/A:N/A] % Reduction in Area: [2:100.00%] [N/A:N/A] % Reduction in Volume: 100.00% [N/A:N/A] Classification:  [2:Grade 3] [N/A:N/A] Exudate Amount: [2:None Present] [N/A:N/A] Wound Margin: [2:Distinct, outline attached N/A] Granulation Amount: [2:None Present (0%)] [N/A:N/A] Necrotic Amount: [2:None Present (0%)] [N/A:N/A] Exposed Structures: [2:Fascia: No Fat Layer (Subcutaneous Tissue) Exposed: No Tendon: No Muscle: No Joint: No Bone: No Large (67-100%)] [N/A:N/A N/A] Treatment Notes Electronic Signature(s) Signed: 10/04/2019 6:05:38 PM By: Linton Ham MD Signed: 10/07/2019 2:17:56 PM By: Kela Millin Entered By: Linton Ham on 10/04/2019 14:03:39 -------------------------------------------------------------------------------- [place] Details Patient Name: Date of  Service: Jane [name], Jane [name] 10/04/2019 1:30 PM Medical Record [id number] Patient Account Number: [id number] Date of Birth/Sex: Treating RN: 06/21/1976 (43 y.o. Clearnce Sorrel Primary Care Lawanda Holzheimer: [name] Other Clinician: Referring Hava Massingale: Treating Hafsa Lohn/Extender:Robson, [name], Buck Mam in Treatment: 16 Active Inactive Electronic Signature(s) Signed: 10/07/2019 2:17:56 PM By: Kela Millin Entered By: Kela Millin on 10/04/2019 13:48:13 -------------------------------------------------------------------------------- Pain Assessment Details Patient Name: Date of Service: Jane [name], Jane [name] 10/04/2019 1:30 PM Medical Record [id number] Patient Account Number: [id number] Date of Birth/Sex: Treating RN: 29-Oct-1976 (43 y.o. Nancy Fetter Primary Care Zeppelin Commisso: [name] Other Clinician: Referring Vandella Ord: Treating Anavey Coombes/Extender:Robson, [name], Buck Mam in Treatment: 16 Active Problems Location of Pain Severity and Description of Pain Patient Has Paino No Site Locations Pain Management and Medication Current Pain Management: Electronic Signature(s) Signed: 10/07/2019 2:18:43 PM By: Levan Hurst RN,  BSN Entered By: Levan Hurst on 10/04/2019 13:25:20 -------------------------------------------------------------------------------- Patient/Caregiver Education Details [name] 11/20/2020andnbsp1:30 Patient Name: Date of Service: L. PM Medical Record Patient Account Number: [id number] Number: Treating RN: Kela Millin Date of Birth/Gender: [birth year] (43 y.o. F) Other Clinician: Primary Care Physician:BASARABA, IULIA Treating Linton Ham Referring Physician: Physician/Extender: Janine Limbo in Treatment: 16 Education Assessment Education Provided To: Patient Education Topics Provided Offloading: Methods: Explain/Verbal Responses: State content correctly Electronic Signature(s) Signed: 10/07/2019 2:17:56 PM By: Kela Millin Entered By: Kela Millin on 10/04/2019 17:30:13 -------------------------------------------------------------------------------- Wound Assessment Details Patient Name: Date of Service: Jane [name] 10/04/2019 1:30 PM Medical Record [id number] Patient Account Number: [id number] Date of Birth/Sex: Treating RN: [birth year] (43 y.o. Nancy Fetter Primary Care Damilola Flamm: [name] Other Clinician: Referring Maahir Horst: Treating Timberlynn Kizziah/Extender:Robson, [name], IULIA Weeks in Treatment: 16 Wound Status Wound Number: 2 Primary Diabetic Wound/Ulcer of the Lower Extremity Etiology: Wound Location: Left Foot - Lateral Wound Healed - Epithelialized Wounding Event: Gradually Appeared Status: Date Acquired: 04/15/2019 Comorbid Cataracts, Glaucoma, Anemia, Hemophilia, Weeks Of Treatment: 16 History: Sickle Cell Disease, Asthma, Arrhythmia, Clustered Wound: No Coronary Artery Disease, Hypertension, Type II Diabetes Photos Wound Measurements Length: (cm) 0 % Reduction Width: (cm) 0 % Reduction Depth: (cm) 0 Epithelializ Area: (cm) 0 Tunneling: Volume: (cm) 0  Undermining Wound Description Classification: Grade 3 Foul Odor Af Wound Margin: Distinct, outline attached Slough/Fibri Exudate Amount: None Present Wound Bed Granulation Amount: None Present (0%) Necrotic Amount: None Present (0%) Fascia Expos Fat Layer (S Tendon Expos Muscle Expos Joint Expose Bone Exposed ter Cleansing: No no No Exposed Structure ed: No ubcutaneous Tissue) Exposed: No ed: No ed: No d: No : No in Area: 100% in Volume: 100% ation: Large (67-100%) No : No Electronic Signature(s) Signed: 10/07/2019 2:18:43 PM By: Levan Hurst RN, BSN Signed: 10/08/2019 7:37:04 AM By: Mikeal Hawthorne EMT/HBOT Entered By: Mikeal Hawthorne on 10/07/2019 10:37:47 -------------------------------------------------------------------------------- Vitals Details Patient Name: Date of Service: Jane [name]. 10/04/2019 1:30 PM Medical Record [id number] Patient Account Number: [id number] Date of Birth/Sex: Treating RN: 03-03-1976 (43 y.o. Nancy Fetter Primary Care Mellie Buccellato: [name] Other Clinician: Referring Maresha Anastos: Treating Delmore Sear/Extender:Robson, [name], IULIA Weeks in Treatment: 16 Vital Signs Time Taken: 13:24 Temperature (F): 98.8 Height (in): 65 Pulse (bpm): 96 Weight (lbs): 225 Respiratory Rate (breaths/min): 16 Body Mass Index (BMI): 37.4 Blood Pressure (mmHg): 124/61 Reference Range: 80 - 120 mg / dl Electronic Signature(s) Signed: 10/07/2019 2:18:43 PM By: Levan Hurst RN, BSN Entered By: Levan Hurst on 10/04/2019 13:25:14

## 2019-10-14 MED FILL — MEGESTROL 40 MG TABLET: 40 | 30 days supply | Qty: 30 | Fill #1

## 2019-10-15 ENCOUNTER — Ambulatory Visit (INDEPENDENT_AMBULATORY_CARE_PROVIDER_SITE_OTHER): Payer: Medicare Other | Admitting: Cardiology

## 2019-10-15 ENCOUNTER — Other Ambulatory Visit: Payer: Self-pay

## 2019-10-15 ENCOUNTER — Encounter: Payer: Self-pay | Admitting: Cardiology

## 2019-10-15 VITALS — BP 147/88 | HR 110 | Temp 97.3°F | Ht 65.0 in | Wt 222.6 lb

## 2019-10-15 DIAGNOSIS — I779 Disorder of arteries and arterioles, unspecified: Secondary | ICD-10-CM | POA: Diagnosis not present

## 2019-10-15 DIAGNOSIS — Z9861 Coronary angioplasty status: Secondary | ICD-10-CM

## 2019-10-15 DIAGNOSIS — I10 Essential (primary) hypertension: Secondary | ICD-10-CM

## 2019-10-15 DIAGNOSIS — E1169 Type 2 diabetes mellitus with other specified complication: Secondary | ICD-10-CM | POA: Diagnosis not present

## 2019-10-15 DIAGNOSIS — N186 End stage renal disease: Secondary | ICD-10-CM | POA: Diagnosis not present

## 2019-10-15 DIAGNOSIS — I2510 Atherosclerotic heart disease of native coronary artery without angina pectoris: Secondary | ICD-10-CM | POA: Diagnosis not present

## 2019-10-15 DIAGNOSIS — E785 Hyperlipidemia, unspecified: Secondary | ICD-10-CM

## 2019-10-15 DIAGNOSIS — Z992 Dependence on renal dialysis: Secondary | ICD-10-CM

## 2019-10-15 MED ORDER — METOPROLOL TARTRATE 25 MG PO TABS: 25.0000 mg | ORAL_TABLET | Freq: Two times a day (BID) | ORAL | 3 refills | Status: DC

## 2019-10-15 MED FILL — METOPROLOL TARTRATE 25 MG T: 25 | 90 days supply | Qty: 180 | Fill #0

## 2019-10-15 NOTE — Patient Instructions (Addendum)
Medication Instructions:   Metoprolol tartrate 25 mg  Twice a day -- do not take the pill the night before dialysis   And the day of dialysis take 2 hours after dialysis. No other changes  *If you need a refill on your cardiac medications before your next appointment, please call your pharmacy*  Lab Work: Lipid Hepatic level  If you have labs (blood work) drawn today and your tests are completely normal, you will receive your results only by: Marland Kitchen MyChart Message (if you have MyChart) OR . A paper copy in the mail If you have any lab test that is abnormal or we need to change your treatment, we will call you to review the results.  Testing/Procedures: Not needed  Follow-Up: At [facility], you and your health needs are our priority.  As part of our continuing mission to provide you with exceptional heart care, we have created designated Provider Care Teams.  These Care Teams include your primary Cardiologist (physician) and Advanced Practice Providers (APPs -  Physician Assistants and Nurse Practitioners) who all work together to provide you with the care you need, when you need it.  Your next appointment:   6 month(s)  The format for your next appointment:   In Person  Provider:   Glenetta Hew, MD  Addendum: Lab results show that your cholesterol levels are not as well-controlled as we wanted them to be. --> Would like to change her cholesterol medicine from atorvastatin 40 mg daily to rosuvastatin 40 mg daily.  Trixie Dredge, RN will be contacting you about this new prescription.  We will then recheck cholesterol levels in roughly 3 months after you make the change.  Glenetta Hew, MD

## 2019-10-15 NOTE — Progress Notes (Signed)
Primary Care Provider: [name], MD Cardiologist: Glenetta Hew, MD Electrophysiologist:   Clinic Note: Chief Complaint  Patient presents with  . Follow-up    No major cardiac complaints  . Coronary Artery Disease    No further chest pain during dialysis    HPI:    Jane [name] is a 43 y.o. female with a PMH below who presents today for delayed 79-monthfollow-up.  Jane BALDERRAMAhas a history of severe single-vessel CAD with BMS PCI to the LAD in May 2017 in the setting of non-STEMI.  BMS was chosen because of upcoming left foot surgery for Charcot foot as complication of diabetes. --> She has severe poorly controlled diabetes with nephrotic range proteinuria and now end-stage renal disease on hemodialysis since February 2019.  She had multiple surgeries on her right foot because of Charcot foot.  -> In October 2019, she had been converted from beta-blocker to diltiazem which has subsequently been stopped, her Plavix was also stopped and started on aspirin which is reasonable for bare-metal stent.  Most recent Myoview October 2019 EF 55 to 65%.  LOW RISK.  Jane CORTOPASSIwas last seen on on December 20, 2018 -> was doing fairly well Jane [name] standpoint.  Never did have the expected surgery on right foot and they were treating expectantly with a boot.  Was hoping to continue to avoid surgery.  Able to walk using the boot.  Noted that her heart rate would go up during and after dialysis but no irregular heartbeats.  Still not walking enough to note claudication.  Remains quite deconditioned.  Recent Hospitalizations: None since last visit  Reviewed  CV studies:    The following studies were reviewed today: (if available, images/films reviewed: From Epic Chart or Care Everywhere) . None since Myoview in October 2019:   Interval History:   Jane CYRreturns here today pretty much stable from a cardiac standpoint.  She still has exertional dyspnea, but  is very deconditioned.  She is finally now able to walk and has a hard time doing it.  Her left knee and right foot still bother her quite a bit.  About the living she will note is that her blood pressures been somewhat labile and that she will be somewhat dizzy after dialysis.  Now she is just had dialysis today and is seeming to be okay.  She is no longer having chest pain and dialysis, just feeling dizzy afterwards.  No real significant fatigue. With the dialysis though she has not had any PND or orthopnea, and edema is pretty well controlled.  Although she has a fast heart rate here today, she denies any sense of rapid heart rates or palpitations.  No syncope or near syncope just the dizziness.  No TIA or amaurosis fugax.  No sensation of irregular heartbeats.   CV Review of Symptoms (Summary) positive for - dyspnea on exertion, orthopnea and Probably related to deconditioning; dizziness and dialysis and after dialysis but no syncope or near syncope negative for - chest pain, edema, irregular heartbeat, palpitations, paroxysmal nocturnal dyspnea, rapid heart rate, shortness of breath or TIA or amaurosis fugax.  Does not walk enough to note claudication  The patient does not have symptoms concerning for COVID-19 infection (fever, chills, cough, or new shortness of breath).  The patient is practicing social distancing. ++ Masking.  Rarely goes out for groceries/shopping.  Tries to do the best she can to protect herself at work   REVIEWED OF  SYSTEMS   A comprehensive ROS was performed. Review of Systems  Constitutional: Positive for malaise/fatigue (Tired at baseline.  But overall better.) and weight loss (Somewhat intentional.  Not eating as much, partly because of some depression.).  HENT: Negative for congestion and nosebleeds.   Respiratory: Positive for shortness of breath (Still with exertion, but getting better.). Negative for cough and wheezing.   Cardiovascular: Positive for orthopnea  (Sleeps at roughly 30 to 45 degrees.) and leg swelling (Mild end of day).  Gastrointestinal: Negative for blood in stool, heartburn and melena.  Genitourinary: Negative for flank pain and hematuria.  Musculoskeletal: Positive for joint pain (Still has pain in the right foot but able to walk now.;  Left knee really hurts.) and myalgias (Legs get sore when walking, mostly deconditioning.).  Neurological: Positive for dizziness (Only after dialysis.) and tingling (Hands tingle from neuropathy.).  Endo/Heme/Allergies: Negative for environmental allergies.  Psychiatric/Behavioral: Negative for depression (Mostly just mourning the loss of her family members and friends.) and memory loss. The patient is not nervous/anxious and does not have insomnia (Does not sleep well.  Always has 3 pillows sleeping about 30 to 40degrees for long-term.).        She has been very sad this year has had to deal with the loss of her sister who died in Apr 09, 2023, and then ex-boyfriend who she had just broken up with died somewhere in late 2023/03/10 early Apr 09, 2023.  She did not is also had one of her girlfriends murdered by the boyfriend. -->  Lost to be sad about, but does have some joy in that she has a new grandkids.  All other systems reviewed and are negative.  She is now doing wound care for her right foot that has a callus on it.  The wound is slowly healing but she is now doing dressing changes.  She also had a left ankle/knee sprain that is hurting her now.  Is keeping her from doing much exercise.  I have reviewed and (if needed) personally updated the patient's problem list, medications, allergies, past medical and surgical history, social and family history.   PAST MEDICAL HISTORY   Past Medical History:  Diagnosis Date  . Asthma    prn inhaler  . CAD S/P BMS PCI to prox LAD 03/31/2016   Ost LAD to Mid LAD lesion, 90% stenosed. Post intervention - Vision BMS 3.0 mm x 18 mm (~3.5 mm) there is a 0% residual stenosis.   .  Charcot foot due to diabetes mellitus ([place]) 11/2016   left 2018; right 2019  . Depression   . Diabetic neuropathy (HCC)    feet  . ESRD (end stage renal disease) on dialysis ([place]) 12/2017   ?chronic interstitial nephritis  . H/O non-ST elevation myocardial infarction (NSTEMI) Apr 08, 2016   Found in 90% mid LAD lesion treated with bare-metal stent (BMS) PCI - vision BMS 3.0 mm x 18 mm  . History of MRSA infection   . Hyperlipidemia   . Hypertension    medication dose increased 11/22/2015; has been taking med. consistently since 04/08/16, per pt.  . Insulin dependent diabetes mellitus   . Iron deficiency anemia    takes iron supplement  . Retinopathy of both eyes   . Sickle cell trait ([place])   . 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 ACHILLES TENDON LENGTHENING Left 11/24/2016   Procedure: Left Achilles tendon lengthening (open);  Surgeon: Wylene Simmer, MD;  Location: [facility];  Service: Orthopedics;  Laterality: Left;  . AV FISTULA PLACEMENT Right 07/31/2017   Procedure: ARTERIOVENOUS BRACHIOCEPHALIC FISTULA CREATION RIGHT ARM;  Surgeon: Conrad Manteo, MD;  Location: [facility];  Service: Vascular;  Laterality: Right;  . CALCANEAL OSTEOTOMY Left 11/24/2016   Procedure: Left hindfoot osteotomy and fusion;  Surgeon: Wylene Simmer, MD;  Location: [facility];  Service: Orthopedics;  Laterality: Left;  . CARDIAC CATHETERIZATION N/A 03/31/2016   Procedure: Left Heart Cath and Coronary Angiography;  Surgeon: Lorretta Harp, MD;  Location: [facility] INVASIVE CV LAB: 90% early mLAD, normal LV Fxn  . CARDIAC CATHETERIZATION N/A 03/31/2016   Procedure: Coronary Stent Intervention;  Surgeon: Lorretta Harp, MD;  Location: [facility] CV LAB;  Service: Cardiovascular: PCI to mLAD BMS Vision 3.0 mm x 18 mm  . CESAREAN SECTION  1997  . CORONARY ANGIOPLASTY    . FISTULA SUPERFICIALIZATION Right 11/15/2017    Procedure: FISTULA SUPERFICIALIZATION RIGHT BRACHIOCEPHALIC  RIGHT;  Surgeon: Conrad , MD;  Location: [facility];  Service: Vascular;  Laterality: Right;  . NM MYOVIEW LTD  08/2018   LOW RISK. EF 55-65%. No ischemia or Infarction  . TRANSTHORACIC ECHOCARDIOGRAM  03/2016   Normal LV size and thickness. EF 60-65%. GR 1 DD. Otherwise essentially normal.  . TUBAL LIGATION  1997    Cardiac catheterization with PCI May 2017:90% mLAD; nl LV Fxn--> PCI - Vision BMS3.48mx 18 mm   MEDICATIONS/ALLERGIES   Current Meds  Medication Sig  . ACCU-CHEK FASTCLIX LANCETS MISC Check blood sugar 4x/day  . acetaminophen (TYLENOL) 500 MG tablet Take 1,000 mg by mouth every 6 (six) hours as needed (for pain.).  .Marland Kitchenaspirin EC 81 MG tablet Take 1 tablet (81 mg total) by mouth daily.  .Marland Kitchenatorvastatin (LIPITOR) 40 MG tablet Take 1 tablet (40 mg total) by mouth daily.  . Blood Glucose Monitoring Suppl (ACCU-CHEK GUIDE) w/Device KIT 1 each by Does not apply route 3 (three) times daily.  . cefdinir (OMNICEF) 300 MG capsule cefdinir 300 mg capsule  . Continuous Blood Gluc Receiver (DEXCOM G6 RECEIVER) DEVI 1 each by Does not apply route 6 (six) times daily.  . Continuous Blood Gluc Sensor (DEXCOM G6 SENSOR) MISC 1 each by Does not apply route 6 (six) times daily.  . Continuous Blood Gluc Transmit (DEXCOM G6 TRANSMITTER) MISC 1 each by Does not apply route 6 (six) times daily.  .Marland Kitchenglucose blood (ACCU-CHEK GUIDE) test strip Check blood sugar up to 5 times a day  . insulin aspart (NOVOLOG) 100 UNIT/ML FlexPen Use 4-9U three times daily based on your carb counts and correction scale for a maximum of 24U daily  . insulin glargine (LANTUS) 100 UNIT/ML injection Inject 0.24 mLs (24 Units total) into the skin at bedtime.  . Insulin Pen Needle 32G X 4 MM MISC Use to inject insulin 3 times a day  . Insulin Syringe-Needle U-100 31G X 15/64" 0.3 ML MISC Use to inject insulin daily  . megestrol (MEGACE) 40 MG tablet Take 1  tablet (40 mg total) by mouth daily.  . multivitamin (RENA-VIT) TABS tablet Take 1 tablet by mouth daily.  . promethazine (PHENERGAN) 12.5 MG tablet Take 1 tablet (12.5 mg total) by mouth every 6 (six) hours as needed for nausea or vomiting.  .Marland KitchenPROVENTIL HFA 108 (90 Base) MCG/ACT inhaler INHALE 2 PUFFS BY MOUTH EVERY 4 HOURS AS NEEDED FOR COUGHING, WHEEZING, OR SHORTNESS OF BREATH (  Patient taking differently: Inhale 2 puffs into the lungs every 4 (four) hours as needed for shortness of breath. )  . RENAGEL 800 MG tablet Take 4,800 mg by mouth See admin instructions. Take 4800 mg tablets three times daily with meals  . venlafaxine XR (EFFEXOR-XR) 150 MG 24 hr capsule Take 1 capsule (150 mg total) by mouth daily.    Allergies  Allergen Reactions  . Adhesive [Tape] Other (See Comments)    Irritation      SOCIAL HISTORY/FAMILY HISTORY   Social History   Tobacco Use  . Smoking status: Former Smoker    Packs/day: 0.00    Years: 0.00    Pack years: 0.00    Quit date: 08/29/2015    Years since quitting: 4.1  . Smokeless tobacco: Never Used  Substance Use Topics  . Alcohol use: No    Alcohol/week: 0.0 standard drinks  . Drug use: No   Social History   Social History Narrative   Patient does not drink caffeine.   Patient is right handed.     Family History family history includes Aneurysm in her mother; Cancer in her paternal grandfather; Diabetes in her father, maternal grandfather, maternal grandmother, mother, and sister; Hypertension in her father, mother, and sister; Stroke in her mother.   OBJCTIVE -PE, EKG, labs   Wt Readings from Last 3 Encounters:  10/15/19 222 lb 9.6 oz (101 kg)  12/18/18 225 lb (102.1 kg)  11/29/18 229 lb 4.8 oz (104 kg)    Physical Exam: BP (!) 147/88   Pulse (!) 110   Temp (!) 97.3 F (36.3 C)   Ht '5\' 5"'$  (1.651 m)   Wt 222 lb 9.6 oz (101 kg)   SpO2 99%   BMI 37.04 kg/m  Physical Exam  Constitutional: She is oriented to person, place,  and time. She appears well-developed and well-nourished. No distress.  With multiple comorbidities and BMI of 37, morbidly obese.  Well-groomed.  HENT:  Head: Normocephalic and atraumatic.  Eyes: EOM are normal.  Neck: Normal range of motion. Neck supple. Hepatojugular reflux (Trace-8 cmH2O.) present. No JVD present. Carotid bruit is not present.  Cardiovascular: Regular rhythm, S1 normal and S2 normal.  Occasional extrasystoles are present. Tachycardia present. PMI is not displaced (Unable to palpate). Exam reveals distant heart sounds and decreased pulses (Decreased, faint pedal pulses). Exam reveals no gallop and no friction rub.  No murmur heard. Pulmonary/Chest: Effort normal. No respiratory distress. She has no wheezes. She has no rales.  Diffuse mild interstitial sounds.  Abdominal: Soft. Bowel sounds are normal. She exhibits no distension. There is no abdominal tenderness.  Obese.  Unable to assess HSM  Musculoskeletal: Normal range of motion.        General: Edema (Trace to 1+ bilateral LE) present.  Neurological: She is alert and oriented to person, place, and time.  Psychiatric: Her behavior is normal. Judgment and thought content normal.  Flat affect, normal mood, but seems a little down     Adult ECG Report  Rate: 107 ;  Rhythm: sinus tachycardia and Borderline criteria for left atrial enlargement.  Cannot exclude anterior MI, age undetermined.;   Narrative Interpretation: When compared to last EKG, axis appears to be somewhat different.  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  We will plan to order LEA Dopplers after follow-up to reassess.      Relevant Medications   metoprolol tartrate (LOPRESSOR) 25 MG tablet   CAD S/P BMS PCI to prox LAD - Primary (Chronic)    No further anginal symptoms.  No more chest pain at rest or during dialysis.  Bare-metal stent LAD now almost 3-1/2 years out.  No longer on Plavix.  Is back on aspirin. On atorvastatin and due for lab check.  Will restart low-dose beta-blocker.      Relevant Medications   metoprolol tartrate (LOPRESSOR) 25 MG tablet   Other Relevant Orders   EKG 12-Lead (Completed)   Lipid panel (Completed)   Hepatic function panel (Completed)   Hyperlipidemia associated with type 2 diabetes mellitus (HCC) (Chronic)    No lipids drawn since April 2018 -> was restarted on atorvastatin. Labs checked today show LDL of 100 which is much lower than it was back in 2018.  Plan will be to convert from atorvastatin 40 mg to rosuvastatin 40 mg and recheck labs in 4 months.  May need more aggressive therapy.  Triglycerides are also elevated having to do with diabetes, may benefit from Vascepa.  Issues will be financial consideration.      Relevant Orders   Lipid panel (Completed)   Hepatic function panel (Completed)   Morbid obesity ([place]) (Chronic)    Hopefully she is able to start working on exercise.  We toyed with dietary education.  She does appear to have lost some weight since January.  Continue to recommend slow and gradual weight loss.  Hopefully as she is able to exercise this will happen faster.      Essential hypertension (Chronic)    Somewhat labile pressures.  Had been on diltiazem, that was stopped.  I am little bit concerned about her tachycardia.  Her blood pressures today after dialysis are pretty high.  I think we can tolerate getting back on a beta-blocker.    We will  start low-dose metoprolol 25 mg twice daily.  She will hold her dose on the evening prior to dialysis and not take the a.m. dose until 2 hours after dialysis.      Relevant Medications   metoprolol tartrate (LOPRESSOR) 25 MG tablet   ESRD (end stage renal disease) on dialysis [facility]) (Chronic)    On Tuesday Thursday Saturday schedule.  No more dialysis related chest pain.  Volume control by dialysis.          COVID-19 Education: The signs and symptoms of COVID-19 were discussed with the patient and how to seek care for testing (follow up with PCP or arrange E-visit).   The importance of social distancing was discussed today.  I spent a total of 76mnutes with the patient and chart review. >  50% of the time was spent in direct patient consultation.  Additional time spent with chart review (studies, outside notes, etc): 8 Total Time: 30 min   Current medicines are reviewed at length with the patient today.  (+/- concerns) n/a   Patient Instructions / Medication Changes &  Studies & Tests Ordered   Patient Instructions  Medication Instructions:   Metoprolol tartrate 25 mg  Twice a day -- do not take the pill the night before dialysis   And the day of dialysis take 2 hours after dialysis. No other changes  *If you need a refill on your cardiac medications before your next appointment, please call your pharmacy*  Lab Work: Lipid Hepatic level  If you have labs (blood work) drawn today and your tests are completely normal, you will receive your results only by: Marland Kitchen MyChart Message (if you have MyChart) OR . A paper copy in the mail If you have any lab test that is abnormal or we need to change your treatment, we will call you to review the results.  Testing/Procedures: Not needed  Follow-Up: At [facility], you and your health needs are our priority.  As part of our continuing mission to provide you with exceptional heart care, we have created designated Provider Care Teams.   These Care Teams include your primary Cardiologist (physician) and Advanced Practice Providers (APPs -  Physician Assistants and Nurse Practitioners) who all work together to provide you with the care you need, when you need it.  Your next appointment:   6 month(s)  The format for your next appointment:   In Person  Provider:   Glenetta Hew, MD  Addendum: Lab results show that your cholesterol levels are not as well-controlled as we wanted them to be. --> Would like to change her cholesterol medicine from atorvastatin 40 mg daily to rosuvastatin 40 mg daily.  Trixie Dredge, RN will be contacting you about this new prescription.  We will then recheck cholesterol levels in roughly 3 months after you make the change.  Glenetta Hew, MD      Studies Ordered:   Orders Placed This Encounter  Procedures  . Lipid panel  . Hepatic function panel  . EKG 12-Lead     Glenetta Hew, M.D., M.S. Interventional Cardiologist   Pager # [phone] Phone # [phone] [address]. [place], [place] [address]   Thank you for choosing [facility] at [facility]!!

## 2019-10-16 LAB — HEPATIC FUNCTION PANEL
ALT: 12 IU/L (ref 0–32)
AST: 15 IU/L (ref 0–40)
Albumin: 4.4 g/dL (ref 3.8–4.8)
Alkaline Phosphatase: 81 IU/L (ref 39–117)
Bilirubin Total: 0.4 mg/dL (ref 0.0–1.2)
Bilirubin, Direct: 0.12 mg/dL (ref 0.00–0.40)
Total Protein: 7.9 g/dL (ref 6.0–8.5)

## 2019-10-16 LAB — LIPID PANEL
Chol/HDL Ratio: 4.2 ratio (ref 0.0–4.4)
Cholesterol, Total: 186 mg/dL (ref 100–199)
HDL: 44 mg/dL (ref >39)
LDL Chol Calc (NIH): 100 mg/dL — ABNORMAL HIGH (ref 0–99)
Triglycerides: 249 mg/dL — ABNORMAL HIGH (ref 0–149)
VLDL Cholesterol Cal: 42 mg/dL — ABNORMAL HIGH (ref 5–40)

## 2019-10-18 ENCOUNTER — Encounter: Payer: Self-pay | Admitting: Cardiology

## 2019-10-18 NOTE — Assessment & Plan Note (Signed)
No further anginal symptoms.  No more chest pain at rest or during dialysis.  Bare-metal stent LAD now almost 3-1/2 years out.  No longer on Plavix.  Is back on aspirin. On atorvastatin and due for lab check.  Will restart low-dose beta-blocker.

## 2019-10-18 NOTE — Assessment & Plan Note (Signed)
No lipids drawn since April 2018 -> was restarted on atorvastatin. Labs checked today show LDL of 100 which is much lower than it was back in 2018.  Plan will be to convert from atorvastatin 40 mg to rosuvastatin 40 mg and recheck labs in 4 months.  May need more aggressive therapy.  Triglycerides are also elevated having to do with diabetes, may benefit from Vascepa.  Issues will be financial consideration.

## 2019-10-18 NOTE — Assessment & Plan Note (Signed)
Somewhat labile pressures.  Had been on diltiazem, that was stopped.  I am little bit concerned about her tachycardia.  Her blood pressures today after dialysis are pretty high.  I think we can tolerate getting back on a beta-blocker.    We will start low-dose metoprolol 25 mg twice daily.  She will hold her dose on the evening prior to dialysis and not take the a.m. dose until 2 hours after dialysis.

## 2019-10-18 NOTE — Assessment & Plan Note (Signed)
Hopefully she is able to start working on exercise.  We toyed with dietary education.  She does appear to have lost some weight since January.  Continue to recommend slow and gradual weight loss.  Hopefully as she is able to exercise this will happen faster.

## 2019-10-18 NOTE — Assessment & Plan Note (Signed)
Not really having any claudication symptoms.  We will plan to order LEA Dopplers after follow-up to reassess.

## 2019-10-18 NOTE — Assessment & Plan Note (Signed)
On Tuesday Thursday Saturday schedule.  No more dialysis related chest pain.  Volume control by dialysis.

## 2019-10-22 NOTE — Progress Notes (Signed)
Jane [name], Jane [name] ([id number]) Visit Report for 08/23/2019 Arrival Information Details Patient Name: Date of Service: Jane [name], Jane [name] 08/23/2019 1:45 PM Medical Record [id number] Patient Account Number: [id number] Date of Birth/Sex: Treating RN: 09/23/1976 (43 y.o. Nancy Fetter Primary Care Asra Gambrel: [name] Other Clinician: Referring Dhairya Corales: Treating Kemonte Ullman/Extender:Robson, [name], Buck Mam in Treatment: 10 Visit Information History Since Last Visit Added or deleted any medications: No Patient Arrived: Ambulatory Any new allergies or adverse reactions: No Arrival Time: 14:17 Had a fall or experienced change in No Accompanied By: self activities of daily living that may affect Transfer Assistance: None risk of falls: Patient Identification Verified: Yes Signs or symptoms of abuse/neglect since last No Secondary Verification Process Completed: Yes visito Patient Requires Transmission-Based No Hospitalized since last visit: No Precautions: Implantable device outside of the clinic excluding No Patient Has Alerts: No cellular tissue based products placed in the center since last visit: Has Dressing in Place as Prescribed: Yes Pain Present Now: No Electronic Signature(s) Signed: 10/22/2019 3:02:15 PM By: Sandre Kitty Entered By: Sandre Kitty on 08/23/2019 14:18:01 -------------------------------------------------------------------------------- Encounter Discharge Information Details Patient Name: Date of Service: Jane [name] 08/23/2019 1:45 PM Medical Record [id number] Patient Account Number: [id number] Date of Birth/Sex: Treating RN: 1976-01-08 (43 y.o. Debby Bud Primary Care Jr Milliron: [name] Other Clinician: Referring Delainey Winstanley: Treating Mikala Podoll/Extender:Robson, [name], Buck Mam in Treatment: 10 Encounter Discharge Information Items Post Procedure Vitals Discharge Condition:  Stable Temperature (F): 98.3 Ambulatory Status: Ambulatory Pulse (bpm): 89 Discharge Destination: Home Respiratory Rate (breaths/min): 18 Transportation: Private Auto Blood Pressure (mmHg): 184/88 Accompanied By: self Schedule Follow-up Appointment: Yes Clinical Summary of Care: Electronic Signature(s) Signed: 08/23/2019 6:01:54 PM By: Deon Pilling Entered By: Deon Pilling on 08/23/2019 14:57:54 -------------------------------------------------------------------------------- Lower Extremity Assessment Details Patient Name: Date of Service: Jane [name], Jane [name] 08/23/2019 1:45 PM Medical Record [id number] Patient Account Number: [id number] Date of Birth/Sex: Treating RN: [birth year] (43 y.o. Nancy Fetter Primary Care Olla Delancey: [name] Other Clinician: Referring Maui Ahart: Treating Jameah Rouser/Extender:Robson, [name], IULIA Weeks in Treatment: 10 Edema Assessment Assessed: [Left: No] [Right: No] Edema: [Left: N] [Right: o] Calf Left: Right: Point of Measurement: 38 cm From Medial Instep 38 cm cm Ankle Left: Right: Point of Measurement: 12 cm From Medial Instep 24 cm cm Vascular Assessment Pulses: Dorsalis Pedis Palpable: [Left:Yes] Electronic Signature(s) Signed: 08/27/2019 5:52:09 PM By: Levan Hurst RN, BSN Entered By: Levan Hurst on 08/23/2019 14:34:21 -------------------------------------------------------------------------------- Multi Wound Chart Details Patient Name: Date of Service: Jane [name] 08/23/2019 1:45 PM Medical Record [id number] Patient Account Number: [id number] Date of Birth/Sex: Treating RN: 1975/12/12 (42 y.o. 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] Comorbid History: [2:Cataracts, Glaucoma, Anemia, Hemophilia, Sickle Cell Disease, Asthma, Arrhythmia, Coronary Artery Disease, Hypertension, Type II Diabetes] [N/A:N/A] Date Acquired: [2:04/15/2019] [N/A:N/A] Weeks of Treatment: [2:10] [N/A:N/A] Wound Status: [2:Open] [N/A:N/A] Measurements L x W x D 0.4x0.4x0.1 [N/A:N/A] (cm) Area (cm) : [2:0.126] [N/A:N/A] Volume (cm) : [2:0.013] [N/A:N/A] % Reduction in Area: [2:97.40%] [N/A:N/A] % Reduction in Volume: 99.30% [N/A:N/A] Classification: [2:Grade 3] [N/A:N/A] Exudate Amount: [2:Medium] [N/A:N/A] Exudate Type: [2:Serosanguineous] [N/A:N/A] Exudate Color: [2:red, brown] [N/A:N/A] Wound Margin: [2:Distinct, outline attached N/A] Granulation  Amount: [2:Large (67-100%)] [N/A:N/A] Granulation Quality: [2:Pink] [N/A:N/A] Necrotic Amount: [2:None Present (0%)] [N/A:N/A] Exposed Structures: [2:Fat Layer (Subcutaneous N/A Tissue) Exposed: Yes Fascia: No Tendon: No Muscle: No Joint: No Bone: No] Epithelialization: [2:Medium (34-66%)] [N/A:N/A] Debridement: [2:Debridement - Selective/Open Wound] [N/A:N/A] Pre-procedure [2:14:38] [N/A:N/A] Verification/Time Out Taken: Tissue Debrided: [2:Necrotic/Eschar] [N/A:N/A] Level: [2:Skin/Epidermis] [N/A:N/A] Debridement Area (sq cm):0.16 [N/A:N/A] Instrument: [2:Curette] [N/A:N/A] Bleeding: [2:Minimum] [N/A:N/A] Hemostasis Achieved: [2:Pressure] [N/A:N/A] Procedural Pain: [2:0] [N/A:N/A] Post Procedural Pain: [2:0] [N/A:N/A] Debridement Treatment [2:Procedure was tolerated] [N/A:N/A] Response: [2:well] Post Debridement [2:0.4x0.4x0.1] [N/A:N/A] Measurements L x W x D (cm) Post Debridement [2:0.013] [N/A:N/A] Volume: (cm) Procedures  Performed: [2:Debridement] [N/A:N/A] Treatment Notes Wound #2 (Left, Lateral Foot) 1. Cleanse With Wound Cleanser 2. Periwound Care Skin Prep 3. Primary Dressing Applied Calcium Alginate Ag 4. Secondary Dressing Dry Gauze Foam Border Dressing 5. Secured With Self Adhesive Bandage 7. Footwear/Offloading device applied Removable Cast Walker/Walking Boot Notes stretch net Electronic Signature(s) Signed: 08/25/2019 10:27:00 AM By: Linton Ham MD Signed: 08/27/2019 5:52:09 PM By: Levan Hurst RN, BSN Entered By: Linton Ham on 08/23/2019 16:38:11 -------------------------------------------------------------------------------- Multi-Disciplinary Care Plan Details Patient Name: Date of Service: Jane [name] 08/23/2019 1:45 PM Medical Record [id number] Patient Account Number: [id number] Date of Birth/Sex: Treating RN: [birth year] (42 y.o. Nancy Fetter Primary Care Michelangelo Rindfleisch: [name] Other Clinician: Referring Rosetta Rupnow: Treating Jomel Whittlesey/Extender:Robson, [name], Buck Mam in Treatment: 10 Active Inactive Nutrition Nursing Diagnoses: Impaired glucose control: actual or potential Potential for alteratiion in Nutrition/Potential for imbalanced nutrition Goals: Patient/caregiver will maintain therapeutic glucose control Date Initiated: 06/14/2019 Target Resolution Date: 09/06/2019 Goal Status: Active Interventions: Assess HgA1c results as ordered upon admission and as needed Assess patient nutrition upon admission and as needed per policy Treatment Activities: Patient referred to Primary Care Physician for further nutritional evaluation : 06/14/2019 Notes: Wound/Skin Impairment Nursing Diagnoses: Impaired tissue integrity Knowledge deficit related to ulceration/compromised skin integrity Goals: Patient/caregiver will verbalize understanding of skin care regimen Date Initiated: 06/14/2019 Date Inactivated: 07/04/2019 Target  Resolution Date: 07/12/2019 Goal Status: Met Ulcer/skin breakdown will have a volume reduction of 30% by week 4 Date Initiated: 06/14/2019 Date Inactivated: 08/09/2019 Target Resolution Date: 07/12/2019 Goal Status: Met Ulcer/skin breakdown will have a volume reduction of 80% by week 12 Date Initiated: 08/09/2019 Target Resolution Date: 09/06/2019 Goal Status: Active Interventions: Assess patient/caregiver ability to obtain necessary supplies Assess patient/caregiver ability to perform ulcer/skin care regimen upon admission and as needed Assess ulceration(s) every visit Provide education on ulcer and skin care Treatment Activities: Skin care regimen initiated : 06/14/2019 Topical wound management initiated : 06/14/2019 Notes: Electronic Signature(s) Signed: 08/27/2019 5:52:09 PM By: Levan Hurst RN, BSN Entered By: Levan Hurst on 08/23/2019 16:25:21 -------------------------------------------------------------------------------- Pain Assessment Details Patient Name: Date of Service: Jane [name] 08/23/2019 1:45 PM Medical Record [id number] Patient Account Number: [id number] Date of Birth/Sex: Treating RN: January 27, 1976 (43 y.o. Nancy Fetter Primary Care [name]: [name] Other Clinician: Referring Kiaan Overholser: Treating Milyn Stapleton/Extender:Robson, [name], IULIA Weeks in Treatment: 10 Active Problems Location of Pain Severity and Description of Pain Patient Has Paino No Site Locations Pain Management and Medication Current Pain Management: Electronic Signature(s) Signed: 08/27/2019 5:52:09 PM By: Levan Hurst RN, BSN Signed: 10/22/2019 3:02:15 PM By: Sandre Kitty Entered By: Sandre Kitty on 08/23/2019 14:19:59 -------------------------------------------------------------------------------- Patient/Caregiver Education Details [name] 10/9/2020andnbsp1:45 Patient Name: Date of Service: L. PM Medical Record Patient Account  Number: [id number] Number: Treating RN: Levan Hurst Date of Birth/Gender: [birth year] (43 y.o. F) Other Clinician: Primary Care Physician: Clearnce Sorrel,  Legrand Como Referring Physician: Physician/Extender: Janine Limbo in Treatment: 10 Education Assessment Education Provided To: Patient Education Topics Provided Wound/Skin Impairment: Methods: Explain/Verbal Responses: State content correctly Electronic Signature(s) Signed: 08/27/2019 5:52:09 PM By: Levan Hurst RN, BSN Entered By: Levan Hurst on 08/23/2019 16:25:30 -------------------------------------------------------------------------------- Wound Assessment Details Patient Name: Date of Service: Jane [name] 08/23/2019 1:45 PM Medical Record [id number] Patient Account Number: [id number] Date of Birth/Sex: Treating RN: 27-Oct-1976 (43 y.o. Nancy Fetter Primary Care Zoanne Newill: [name] Other Clinician: Referring Shatisha Falter: Treating Gabriela Irigoyen/Extender:Robson, [name], IULIA Weeks in Treatment: 10 Wound Status Wound Number: 2 Primary Diabetic Wound/Ulcer of the Lower Extremity Etiology: Wound Location: Left Foot - Lateral Wound Open Wounding Event: Gradually Appeared Status: Date Acquired: 04/15/2019 Comorbid Cataracts, Glaucoma, Anemia, Hemophilia, Weeks Of Treatment: 10 History: Sickle Cell Disease, Asthma, Arrhythmia, Clustered Wound: No Coronary Artery Disease, Hypertension, Type II Diabetes Photos Wound Measurements Length: (cm) 0.4 Width: (cm) 0.4 Depth: (cm) 0.1 Area: (cm) 0.126 Volume: (cm) 0.013 Wound Description Classification: Grade 3 Wound Margin: Distinct, outline attached Exudate Amount: Medium Exudate Type: Serosanguineous Exudate Color: red, brown Wound Bed Granulation Amount: Large (67-100%) Granulation Quality: Pink Necrotic Amount: None Present (0%) or After Cleansing: No Fibrino Yes Exposed Structure osed:  No (Subcutaneous Tissue) Exposed: Yes osed: No osed: No sed: No ed: No % Reduction in Area: 97.4% % Reduction in Volume: 99.3% Epithelialization: Medium (34-66%) Tunneling: No Undermining: No Foul Od Slough/ Fascia Exp Fat Layer Tendon Exp Muscle Exp Joint Expo Bone Expos Electronic Signature(s) Signed: 08/26/2019 3:43:33 PM By: Mikeal Hawthorne EMT/HBOT Signed: 08/27/2019 5:52:09 PM By: Levan Hurst RN, BSN Entered By: Mikeal Hawthorne on 08/26/2019 08:46:15 -------------------------------------------------------------------------------- [place] Details Patient Name: Date of Service: Jane [name] 08/23/2019 1:45 PM Medical Record [id number] Patient Account Number: [id number] Date of Birth/Sex: Treating RN: August 23, 1976 (43 y.o. Nancy Fetter Primary Care Vanda Waskey: [name] Other Clinician: Referring Staria Birkhead: Treating Dayshon Roback/Extender:Robson, [name], IULIA Weeks in Treatment: 10 Vital Signs Time Taken: 14:18 Temperature (F): 98.3 Height (in): 65 Pulse (bpm): 89 Weight (lbs): 225 Respiratory Rate (breaths/min): 18 Body Mass Index (BMI): 37.4 Blood Pressure (mmHg): 184/88 Reference Range: 80 - 120 mg / dl Electronic Signature(s) Signed: 10/22/2019 3:02:15 PM By: Sandre Kitty Entered By: Sandre Kitty on 08/23/2019 14:19:51

## 2019-10-23 ENCOUNTER — Telehealth: Payer: Self-pay | Admitting: *Deleted

## 2019-10-23 DIAGNOSIS — E1169 Type 2 diabetes mellitus with other specified complication: Secondary | ICD-10-CM

## 2019-10-23 DIAGNOSIS — I779 Disorder of arteries and arterioles, unspecified: Secondary | ICD-10-CM

## 2019-10-23 DIAGNOSIS — I2510 Atherosclerotic heart disease of native coronary artery without angina pectoris: Secondary | ICD-10-CM

## 2019-10-23 DIAGNOSIS — Z9861 Coronary angioplasty status: Secondary | ICD-10-CM

## 2019-10-23 DIAGNOSIS — E785 Hyperlipidemia, unspecified: Secondary | ICD-10-CM

## 2019-10-23 MED ORDER — VASCEPA 1 G PO CAPS: 2.0000 g | ORAL_CAPSULE | Freq: Two times a day (BID) | ORAL | 11 refills | Status: DC

## 2019-10-23 MED ORDER — ROSUVASTATIN CALCIUM 40 MG PO TABS: 40.0000 mg | ORAL_TABLET | Freq: Every day | ORAL | 3 refills | Status: DC

## 2019-10-23 MED FILL — VASCEPA 1 GM CAPSULE: 1 | 30 days supply | Qty: 120 | Fill #0

## 2019-10-23 MED FILL — ROSUVASTATIN CALCIUM 40 MG: 40 | 90 days supply | Qty: 90 | Fill #0

## 2019-10-23 NOTE — Telephone Encounter (Signed)
-----   Message from [name], MD sent at 10/16/2019 12:09 PM EST ----- Unfortunately, the cholesterol level was not really want to be. The LDL is 100, target is less than 70 if not closer to 55.  Triglycerides are 249 which goes along with poorly controlled diabetes, but also drives up the LDL.  Plan: d/c atorvastatin -changed to rosuvastatin 40 mg p.o. daily, add Vascepa 2 g twice daily daily  Recheck labs (lipids and LFTs) in 3 months.  Glenetta Hew, MD  Rx: Rosuvastatin 40 mg p.o. daily; dispense #90 tab, 3 refill  Vascepa 2 g (2 tabs) p.o. twice daily; dispense #360 tab; 3 refill

## 2019-10-23 NOTE — Telephone Encounter (Signed)
The patient has been notified of the result and verbalized understanding.  All questions (if any) were answered. Aware  Medication changes possible prior authorization for vascepa.  Labs needed Raiford Simmonds, RN 10/23/2019 3:02 PM

## 2019-10-29 ENCOUNTER — Other Ambulatory Visit: Payer: Self-pay | Admitting: *Deleted

## 2019-10-29 DIAGNOSIS — I2510 Atherosclerotic heart disease of native coronary artery without angina pectoris: Secondary | ICD-10-CM

## 2019-10-29 DIAGNOSIS — I779 Disorder of arteries and arterioles, unspecified: Secondary | ICD-10-CM

## 2019-10-29 DIAGNOSIS — E1169 Type 2 diabetes mellitus with other specified complication: Secondary | ICD-10-CM

## 2019-11-28 MED FILL — MEGESTROL 40 MG TABLET: 40 | 30 days supply | Qty: 30 | Fill #2

## 2019-11-28 MED FILL — VASCEPA 1 GM CAPSULE: 1 | 30 days supply | Qty: 120 | Fill #0

## 2019-11-28 MED FILL — ROSUVASTATIN CALCIUM 40 MG: 40 | 90 days supply | Qty: 90 | Fill #0

## 2019-12-04 DIAGNOSIS — R519 Headache, unspecified: Secondary | ICD-10-CM | POA: Insufficient documentation

## 2019-12-24 ENCOUNTER — Encounter: Payer: Self-pay | Admitting: Internal Medicine

## 2020-01-06 ENCOUNTER — Other Ambulatory Visit: Payer: Self-pay | Admitting: Internal Medicine

## 2020-01-06 DIAGNOSIS — E113493 Type 2 diabetes mellitus with severe nonproliferative diabetic retinopathy without macular edema, bilateral: Secondary | ICD-10-CM

## 2020-01-06 MED FILL — LANTUS 100 UNITS/ML VIAL: 100 | 28 days supply | Qty: 10 | Fill #0

## 2020-01-06 MED FILL — MEGESTROL 40 MG TABLET: 40 | 30 days supply | Qty: 30 | Fill #3

## 2020-01-08 ENCOUNTER — Other Ambulatory Visit: Payer: Self-pay | Admitting: *Deleted

## 2020-01-09 MED ORDER — INSULIN ASPART 100 UNIT/ML FLEXPEN: PEN_INJECTOR | SUBCUTANEOUS | 1 refills | Status: DC

## 2020-01-09 MED FILL — NOVOLOG FLEXPEN SYRINGE: 100 | 62 days supply | Qty: 15 | Fill #0

## 2020-01-29 ENCOUNTER — Encounter: Payer: Self-pay | Admitting: Internal Medicine

## 2020-01-29 ENCOUNTER — Other Ambulatory Visit: Payer: Self-pay

## 2020-01-29 ENCOUNTER — Ambulatory Visit (INDEPENDENT_AMBULATORY_CARE_PROVIDER_SITE_OTHER): Payer: Medicare Other | Admitting: Internal Medicine

## 2020-01-29 VITALS — BP 133/49 | HR 92 | Temp 99.9°F | Ht 65.0 in | Wt 227.6 lb

## 2020-01-29 DIAGNOSIS — R1012 Left upper quadrant pain: Secondary | ICD-10-CM

## 2020-01-29 DIAGNOSIS — F321 Major depressive disorder, single episode, moderate: Secondary | ICD-10-CM

## 2020-01-29 DIAGNOSIS — N186 End stage renal disease: Secondary | ICD-10-CM

## 2020-01-29 DIAGNOSIS — Z992 Dependence on renal dialysis: Secondary | ICD-10-CM

## 2020-01-29 DIAGNOSIS — I120 Hypertensive chronic kidney disease with stage 5 chronic kidney disease or end stage renal disease: Secondary | ICD-10-CM

## 2020-01-29 DIAGNOSIS — R109 Unspecified abdominal pain: Secondary | ICD-10-CM

## 2020-01-29 DIAGNOSIS — E113493 Type 2 diabetes mellitus with severe nonproliferative diabetic retinopathy without macular edema, bilateral: Secondary | ICD-10-CM | POA: Diagnosis not present

## 2020-01-29 DIAGNOSIS — J4520 Mild intermittent asthma, uncomplicated: Secondary | ICD-10-CM

## 2020-01-29 DIAGNOSIS — I10 Essential (primary) hypertension: Secondary | ICD-10-CM

## 2020-01-29 DIAGNOSIS — Z794 Long term (current) use of insulin: Secondary | ICD-10-CM

## 2020-01-29 DIAGNOSIS — F339 Major depressive disorder, recurrent, unspecified: Secondary | ICD-10-CM

## 2020-01-29 DIAGNOSIS — Z79899 Other long term (current) drug therapy: Secondary | ICD-10-CM

## 2020-01-29 DIAGNOSIS — N3001 Acute cystitis with hematuria: Secondary | ICD-10-CM

## 2020-01-29 DIAGNOSIS — E1122 Type 2 diabetes mellitus with diabetic chronic kidney disease: Secondary | ICD-10-CM | POA: Diagnosis not present

## 2020-01-29 DIAGNOSIS — Z87440 Personal history of urinary (tract) infections: Secondary | ICD-10-CM

## 2020-01-29 DIAGNOSIS — Z955 Presence of coronary angioplasty implant and graft: Secondary | ICD-10-CM

## 2020-01-29 DIAGNOSIS — I2510 Atherosclerotic heart disease of native coronary artery without angina pectoris: Secondary | ICD-10-CM

## 2020-01-29 DIAGNOSIS — D509 Iron deficiency anemia, unspecified: Secondary | ICD-10-CM

## 2020-01-29 DIAGNOSIS — D508 Other iron deficiency anemias: Secondary | ICD-10-CM

## 2020-01-29 DIAGNOSIS — J45909 Unspecified asthma, uncomplicated: Secondary | ICD-10-CM

## 2020-01-29 DIAGNOSIS — I252 Old myocardial infarction: Secondary | ICD-10-CM

## 2020-01-29 LAB — POCT URINALYSIS DIPSTICK
Bilirubin, UA: NEGATIVE
Glucose, UA: POSITIVE — AB
Ketones, UA: NEGATIVE
Nitrite, UA: NEGATIVE
Protein, UA: POSITIVE — AB
Spec Grav, UA: 1.02 (ref 1.010–1.025)
Urobilinogen, UA: 0.2 E.U./dL
pH, UA: 8.5 — AB (ref 5.0–8.0)

## 2020-01-29 LAB — GLUCOSE, CAPILLARY: Glucose-Capillary: 205 mg/dL — ABNORMAL HIGH (ref 70–99)

## 2020-01-29 LAB — POCT GLYCOSYLATED HEMOGLOBIN (HGB A1C): Hemoglobin A1C: 9.5 % — AB (ref 4.0–5.6)

## 2020-01-29 MED ORDER — ACCU-CHEK GUIDE VI STRP: ORAL_STRIP | 3 refills | Status: DC

## 2020-01-29 MED ORDER — SULFAMETHOXAZOLE-TRIMETHOPRIM 400-80 MG PO TABS: 1.0000 | ORAL_TABLET | Freq: Two times a day (BID) | ORAL | 0 refills | Status: AC

## 2020-01-29 MED ORDER — INSULIN GLARGINE 100 UNIT/ML ~~LOC~~ SOLN: 34.0000 [IU] | Freq: Every day | SUBCUTANEOUS | 3 refills | Status: DC

## 2020-01-29 MED ORDER — ALBUTEROL SULFATE HFA 108 (90 BASE) MCG/ACT IN AERS: INHALATION_SPRAY | RESPIRATORY_TRACT | 2 refills | Status: DC

## 2020-01-29 MED ORDER — SERTRALINE HCL 50 MG PO TABS: 100.0000 mg | ORAL_TABLET | Freq: Every day | ORAL | 0 refills | Status: DC

## 2020-01-29 MED ORDER — INSULIN ASPART 100 UNIT/ML FLEXPEN: PEN_INJECTOR | SUBCUTANEOUS | 3 refills | Status: DC

## 2020-01-29 MED ORDER — SULFAMETHOXAZOLE-TRIMETHOPRIM 800-160 MG PO TABS: 1.0000 | ORAL_TABLET | Freq: Once | ORAL | 0 refills | Status: AC

## 2020-01-29 MED FILL — ALBUTEROL SULFATE HFA 108 (: 108 (90 BAS | 16 days supply | Qty: 9 | Fill #0

## 2020-01-29 MED FILL — SERTRALINE HCL 50 MG TABLET: 50 | 90 days supply | Qty: 180 | Fill #0

## 2020-01-29 MED FILL — LANTUS 100 UNITS/ML VIAL: 100 | 88 days supply | Qty: 30 | Fill #0

## 2020-01-29 MED FILL — NOVOLOG FLEXPEN SYRINGE: 100 | 63 days supply | Qty: 15 | Fill #0

## 2020-01-29 MED FILL — SULFAMETHOXAZOLE-TMP DS TAB: 800-160 | 1 days supply | Qty: 1 | Fill #0

## 2020-01-29 MED FILL — ACCU-CHEK GUIDE STRP: 80 days supply | Qty: 400 | Fill #0

## 2020-01-29 MED FILL — SULFAMETHOXAZOLE-TMP SS TAB: 400-80 | 2 days supply | Qty: 4 | Fill #0

## 2020-01-29 NOTE — Progress Notes (Signed)
CC: T2DM  HPI:  [name] is a 44 y.o. with a PMHx of T2DM c/b ESRD on HD, CAD c/b MI s/p PCI, asthma, MDD who presents to the clinic for T2DM.   Please see the Encounters tab for problem-based Assessment & Plan regarding status of patient's chronic conditions.  Past Medical History:  Diagnosis Date  . Asthma    prn inhaler  . CAD S/P BMS PCI to prox LAD 03/31/2016   Ost LAD to Mid LAD lesion, 90% stenosed. Post intervention - Vision BMS 3.0 mm x 18 mm (~3.5 mm) there is a 0% residual stenosis.   . Charcot foot due to diabetes mellitus ([place]) 11/2016   left 2018; right 2019  . Depression   . Diabetic neuropathy (HCC)    feet  . ESRD (end stage renal disease) on dialysis ([place]) 12/2017   ?chronic interstitial nephritis  . H/O non-ST elevation myocardial infarction (NSTEMI) 03/2016   Found in 90% mid LAD lesion treated with bare-metal stent (BMS) PCI - vision BMS 3.0 mm x 18 mm  . History of MRSA infection   . Hyperlipidemia   . Hypertension    medication dose increased 11/22/2015; has been taking med. consistently since 03/2016, per pt.  . Insulin dependent diabetes mellitus   . Iron deficiency anemia    takes iron supplement  . Retinopathy of both eyes   . Sickle cell trait ([place])   . 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  Respiratory: Negative for shortness of breath.   Cardiovascular: Negative for chest pain, palpitations and leg swelling.  Gastrointestinal: Negative for nausea and vomiting.  Genitourinary: Positive for dysuria and frequency. Negative for hematuria.  Musculoskeletal: Negative for myalgias.  Neurological: Positive for sensory change (pain to touch in the LUQ abdominal region). Negative for dizziness, focal weakness, weakness and headaches.   Physical Exam:  Vitals:   01/29/20 1417  BP: (!) 133/49  Pulse: 92  Temp: 99.9 F (37.7 C)    TempSrc: Oral  SpO2: 100%  Weight: 227 lb 9.6 oz (103.2 kg)  Height: 5\' 5"  (1.651 m)    Physical Exam Vitals and nursing note reviewed.  Constitutional:      General: She is not in acute distress.    Appearance: She is obese.  Cardiovascular:     Rate and Rhythm: Normal rate and regular rhythm.     Heart sounds: No murmur.  Pulmonary:     Effort: Pulmonary effort is normal. No respiratory distress.  Abdominal:     General: Bowel sounds are normal. There is no distension.     Palpations: Abdomen is soft.     Tenderness: There is no abdominal tenderness.  Musculoskeletal:        General: No signs of injury.     Right lower leg: No edema.     Left lower leg: No edema.  Skin:    Comments: Tenderness to light touch on the LUQ of abdomen following dermatone T5-T6  Neurological:     General: No focal deficit present.     Mental Status: She is alert and oriented to person, place, and time. Mental status is at baseline.  Psychiatric:        Mood and Affect: Mood normal.        Behavior: Behavior normal.     Assessment & Plan:   See Encounters Tab for problem based charting.  Patient discussed with Dr. Lynnae January

## 2020-01-29 NOTE — Patient Instructions (Addendum)
It was nice seeing you today! Thank you for choosing [facility] for your Primary Care.    Today we talked about:   1. Left stomach pain: This may be due to developing shingles. If you develop any rash in the area of the pain that looks like small vesicles or blisters, please call the clinic so we can send in medication to pharmacy. Once the rash appears, it is contagious. We are also checking your urine to rule out a UTI.   2. Diabetes: I would encourage you to start checking your sugars, at least the first of the day before breakfast. It will help [place] determine the best Lantus dose for you.   3. Depression: I started you on a medication called Zoloft (Sertraline). Take 1 tablet per day for 1 week. If you are tolerating it okay, increase to two tablets per day. Lets follow up

## 2020-01-30 LAB — CBC
Hematocrit: 36.6 % (ref 34.0–46.6)
Hemoglobin: 11.4 g/dL (ref 11.1–15.9)
MCH: 26.3 pg — ABNORMAL LOW (ref 26.6–33.0)
MCHC: 31.1 g/dL — ABNORMAL LOW (ref 31.5–35.7)
MCV: 85 fL (ref 79–97)
Platelets: 229 10*3/uL (ref 150–450)
RBC: 4.33 x10E6/uL (ref 3.77–5.28)
RDW: 14.3 % (ref 11.7–15.4)
WBC: 5.1 10*3/uL (ref 3.4–10.8)

## 2020-01-31 ENCOUNTER — Ambulatory Visit: Payer: Medicare Other | Admitting: *Deleted

## 2020-01-31 DIAGNOSIS — N186 End stage renal disease: Secondary | ICD-10-CM

## 2020-01-31 DIAGNOSIS — I10 Essential (primary) hypertension: Secondary | ICD-10-CM

## 2020-01-31 DIAGNOSIS — E1169 Type 2 diabetes mellitus with other specified complication: Secondary | ICD-10-CM

## 2020-01-31 DIAGNOSIS — Z992 Dependence on renal dialysis: Secondary | ICD-10-CM

## 2020-01-31 DIAGNOSIS — E113493 Type 2 diabetes mellitus with severe nonproliferative diabetic retinopathy without macular edema, bilateral: Secondary | ICD-10-CM

## 2020-01-31 NOTE — Chronic Care Management (AMB) (Signed)
Chronic Care Management   Initial Visit Note  01/31/2020 Name: Jane [name] MRN: [id number] DOB: 03/03/1976  Referred by: [name], MD Reason for referral : Chronic Care Management (Type 2 DM, HTN, HLD, CAD, ESRD)   Jane [name] is a 44 y.o. year old female who is a primary care patient of [name], MD. The CCM team was consulted for assistance with chronic disease management and care coordination needs related to CAD, HTN, HLD, DMII and ESRD  Review of patient status, including review of consultants reports, relevant laboratory and other test results, and collaboration with appropriate care team members and the patient's provider was performed as part of comprehensive patient evaluation and provision of chronic care management services.    SDOH (Social Determinants of Health) assessments performed: Yes See Care Plan activities for detailed interventions related to SDOH  SDOH Interventions     Most Recent Value  SDOH Interventions  SDOH Interventions for the Following Domains  Food Insecurity, Housing, Social Connections, Transportation, Museum/gallery curator Strain  Food Insecurity Interventions  Other (Comment) [no intervention indicated]  Financial Strain Interventions  Other (Comment) [no intervention indicated]  Housing Interventions  Other (Comment) [no intervention indicated]  Social Connections Interventions  Other (Comment) [no interventions indicated]  Transportation Interventions  Other (Comment) [no intervention indcated]       Medications: Outpatient Encounter Medications as of 01/31/2020  Medication Sig  . ACCU-CHEK FASTCLIX LANCETS MISC Check blood sugar 4x/day  . acetaminophen (TYLENOL) 500 MG tablet Take 1,000 mg by mouth every 6 (six) hours as needed (for pain.).  Marland Kitchen albuterol (PROVENTIL HFA) 108 (90 Base) MCG/ACT inhaler INHALE 2 PUFFS BY MOUTH EVERY 4 HOURS AS NEEDED FOR COUGHING, WHEEZING, OR SHORTNESS OF BREATH  . aspirin EC 81 MG tablet Take 1  tablet (81 mg total) by mouth daily.  . Blood Glucose Monitoring Suppl (ACCU-CHEK GUIDE) w/Device KIT 1 each by Does not apply route 3 (three) times daily.  . cefdinir (OMNICEF) 300 MG capsule cefdinir 300 mg capsule  . glucose blood (ACCU-CHEK GUIDE) test strip Check blood sugar up to 5 times a day  . icosapent Ethyl (VASCEPA) 1 g capsule Take 2 capsules (2 g total) by mouth 2 (two) times daily.  . insulin aspart (NOVOLOG) 100 UNIT/ML FlexPen Use 4-9U three times daily based on your carb counts and correction scale for a maximum of 24U daily  . insulin glargine (LANTUS) 100 UNIT/ML injection Inject 0.34 mLs (34 Units total) into the skin at bedtime.  . Insulin Pen Needle 32G X 4 MM MISC Use to inject insulin 3 times a day  . Insulin Syringe-Needle U-100 31G X 15/64" 0.3 ML MISC Use to inject insulin daily  . megestrol (MEGACE) 40 MG tablet Take 1 tablet (40 mg total) by mouth daily.  . metoprolol tartrate (LOPRESSOR) 25 MG tablet Take 1 tablet (25 mg total) by mouth 2 (two) times daily. Do not take the night before dialysis, and take 2 hours after dialysis.  Marland Kitchen multivitamin (RENA-VIT) TABS tablet Take 1 tablet by mouth daily.  . promethazine (PHENERGAN) 12.5 MG tablet Take 1 tablet (12.5 mg total) by mouth every 6 (six) hours as needed for nausea or vomiting.  Marland Kitchen RENAGEL 800 MG tablet Take 4,800 mg by mouth See admin instructions. Take 4800 mg tablets three times daily with meals  . rosuvastatin (CRESTOR) 40 MG tablet Take 1 tablet (40 mg total) by mouth daily.  . sertraline (ZOLOFT) 50 MG tablet Take 2 tablets (100 mg total)  by mouth daily.  Marland Kitchen sulfamethoxazole-trimethoprim (BACTRIM) 400-80 MG tablet Take 1 tablet by mouth 2 (two) times daily for 2 days.  Marland Kitchen venlafaxine XR (EFFEXOR-XR) 150 MG 24 hr capsule Take 1 capsule (150 mg total) by mouth daily.   No facility-administered encounter medications on file as of 01/31/2020.     Objective:  BP Readings from Last 3 Encounters:  01/29/20 (!)  133/49  10/15/19 (!) 147/88  12/18/18 112/72   Lab Results  Component Value Date   HGBA1C 9.5 (A) 01/29/2020   HGBA1C 8.2 (A) 11/29/2018   HGBA1C 9.1 (A) 08/01/2018   Lab Results  Component Value Date   MICROALBUR 40.6 (H) 08/05/2014 no update needed patient has ESRD   LDLCALC 100 (H) 10/15/2019   CREATININE 11.87 (H) 11/29/2018    Goals Addressed            This Visit's Progress     Patient Stated   . "I need to get my diabetes under better control" (pt-stated)       CARE PLAN ENTRY (see longitudinal plan of care for additional care plan information)  Current Barriers:  . Chronic Disease Management support, education, and care coordination needs related to CAD, HTN, HLD, DMII, and ESRD- patient states she does not check her blood sugars, takes 6 units of Novolog insulin with meals and sometimes does not take Lantus at hs for fear of going low while sleeping. She reports her last known low blood sugar was in October after a dialysis treatment. She was able to correctly state her most recent Hgb A1C. She said she received a Dexcom CGM right before the Covid pandemic in 2020 and wasn't able to receive in -person instruction so she has never used it. She referred to the sensor as "the mark of the devil". She says she has worn a sensor in the past and it showed she was having nocturnal hypoglycemia. She denies difficulty affording her medications.   Clinical Goal(s) related to CAD, HTN, HLD, DMII, and ESRD:  Over the next 14-21 days, patient will:  . Work with the care management team to address educational, disease management, and care coordination needs  . Begin or continue self health monitoring activities as directed today  - consider trying the Dexcom CGM . Call provider office for new or worsened signs and symptoms New or worsened symptom related to DM, HTN, CAD, ESRD . Call care management team with questions or concerns . Verbalize basic understanding of patient centered  plan of care established today  Interventions related to CAD, HTN, HLD, DMII, and ESRD:  . Evaluation of current treatment plans and patient's adherence to plan as established by provider . Assessed patient understanding of disease states . Assessed patient's education and care coordination needs . Provided disease specific education to patient  . Collaborated with appropriate clinical care team members regarding patient needs . Advised patient of diabetic podiatry and shoe benefits . Suggested she administer Lantus every morning instead of at night instead of skipping the medication for fear of having hypoglycemia . Discussed the pros and cons of the Dexcom CGM and requested she consider a trial of the CGM after she receives instruction from [employer]  . Requested patient notify this RNCM or the internal medicine clinic if she would like to schedule an appointment for Dexcom CGM instruction  Patient Self Care Activities related to CAD, HTN, HLD, DMII, and ESRD:  . Patient is unable to independently self-manage chronic health conditions  Initial goal documentation  Ms. Dwyer was given information about Chronic Care Management services today including:  1. CCM service includes personalized support from designated clinical staff supervised by her physician, including individualized plan of care and coordination with other care providers 2. 24/7 contact phone numbers for assistance for urgent and routine care needs. 3. Service will only be billed when office clinical staff spend 20 minutes or more in a month to coordinate care. 4. Only one practitioner may furnish and bill the service in a calendar month. 5. The patient may stop CCM services at any time (effective at the end of the month) by phone call to the office staff. 6. The patient will be responsible for cost sharing (co-pay) of up to 20% of the service fee (after annual deductible is met).  Patient agreed to services  and verbal consent obtained.   Plan:   Telephone follow up appointment with care management team member scheduled VUF:[id number] 14-21 days.  Kelli Churn RN, CCM, [facility] RN Care Manager [phone]

## 2020-01-31 NOTE — Patient Instructions (Signed)
Visit Information It was nice speaking with you today. Please consider trying the Dexcom sensor. You can stop wearing it if you don't like it.  Goals Addressed            This Visit's Progress     Patient Stated   . "I need to get my diabetes under better control" (pt-stated)       CARE PLAN ENTRY (see longitudinal plan of care for additional care plan information)  Current Barriers:  . Chronic Disease Management support, education, and care coordination needs related to CAD, HTN, HLD, DMII, and ESRD- patient states she does not check her blood sugars, takes 6 units of Novolog insulin with meals and sometimes does not take Lantus at hs for fear of going low while sleeping. She reports her last known low blood sugar was in October after a dialysis treatment. She was able to correctly state her most recent Hgb A1C. She said she received a Dexcom CGM right before the Covid pandemic in 2020 and wasn't able to receive in -person instruction so she has never used it. She referred to the sensor as "the mark of the devil". She says she has worn a sensor in the past and it showed she was having nocturnal hypoglycemia. She denies difficulty affording her medications.  Clinical Goal(s) related to CAD, HTN, HLD, DMII, and ESRD:  Over the next 14-21 days, patient will:  . Work with the care management team to address educational, disease management, and care coordination needs  . Begin or continue self health monitoring activities as directed today  - consider trying the Dexcom CGM . Call provider office for new or worsened signs and symptoms New or worsened symptom related to DM, HTN, CAD, ESRD . Call care management team with questions or concerns . Verbalize basic understanding of patient centered plan of care established today  Interventions related to CAD, HTN, HLD, DMII, and ESRD:  . Evaluation of current treatment plans and patient's adherence to plan as established by provider . Assessed  patient understanding of disease states . Assessed patient's education and care coordination needs . Provided disease specific education to patient  . Collaborated with appropriate clinical care team members regarding patient needs . Advised patient of diabetic podiatry and shoe benefits . Suggested she administer Lantus every morning instead of at night instead of skipping the medication for fear of having hypoglycemia . Discussed the pros and cons of the Dexcom CGM and requested she consider a trial of the CGM after she receives instruction from [employer]  . Requested patient notify this RNCM or the internal medicine clinic if she would like to schedule an appointment for Dexcom CGM instruction  Patient Self Care Activities related to CAD, HTN, HLD, DMII, and ESRD:  . Patient is unable to independently self-manage chronic health conditions  Initial goal documentation        Ms. Buxton was given information about Chronic Care Management services today including:  1. CCM service includes personalized support from designated clinical staff supervised by her physician, including individualized plan of care and coordination with other care providers 2. 24/7 contact phone numbers for assistance for urgent and routine care needs. 3. Service will only be billed when office clinical staff spend 20 minutes or more in a month to coordinate care. 4. Only one practitioner may furnish and bill the service in a calendar month. 5. The patient may stop CCM services at any time (effective at the end of the month) by phone  call to the office staff. 6. The patient will be responsible for cost sharing (co-pay) of up to 20% of the service fee (after annual deductible is met).  Patient agreed to services and verbal consent obtained.   The patient verbalized understanding of instructions provided today and declined a print copy of patient instruction materials.   The care management team will reach out  to the patient again over the next 14-21 days.   Kelli Churn RN, CCM, [facility] RN Care Manager [phone]

## 2020-02-02 DIAGNOSIS — N3001 Acute cystitis with hematuria: Secondary | ICD-10-CM | POA: Insufficient documentation

## 2020-02-02 DIAGNOSIS — R109 Unspecified abdominal pain: Secondary | ICD-10-CM | POA: Insufficient documentation

## 2020-02-02 NOTE — Assessment & Plan Note (Signed)
Jane [name] states she has been feeling down after having a very difficulty year. This has made her day to day life difficult and she finds it hard to find the motivation to even shower. She denies SI or HI. She finds that she tends to anger easily though.   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 This is improved from past assessments, however she would still benefit from additional management. We discussed different options; Jane [name] is open to trial of SSRI.   Plan:  - Sertraline 100 mg daily with instructions to take 50 mg daily for the first week and then transition to 100 mg daily.  - 6-8 week follow up for re-evaluation

## 2020-02-02 NOTE — Assessment & Plan Note (Signed)
Jane [name] denies any acute bleeding recently, including hematuria.   Assessment:  Patient has a history of IDA. CBC rechecked and shows stability with MCV within normal limits.   Plan: - Continue to monitor

## 2020-02-02 NOTE — Assessment & Plan Note (Addendum)
Jane [name] denies any current wheezing or SOB. She states she rarely requires use of her inhaler, at most 1-2 per month, usually during high pollen seasons, as these are triggers.   Assessment:  Consistent with mild intermittent asthma.   Plan:  - Refilled Proventil inhaler, 2 puffs q4h PRN for wheezing

## 2020-02-02 NOTE — Assessment & Plan Note (Signed)
BP: (!) 133/49  Well controlled at this time. She follows up with Cardiology regularly (Dr. [name]).  Plan:  - Continue Metoprolol 25 mg BID

## 2020-02-02 NOTE — Assessment & Plan Note (Signed)
Lab Results  Component Value Date   HGBA1C 9.5 (A) 01/29/2020   Jane [name] states she has not been using her CGM as she does not like wearing it. She does not regularly check her sugars either but estimates her sugars around ~240-250. She understands the danger of this and is aware of the risk of hypoglycemia. She denies any hypoglycemic events recently as far as she knows.   Currently, she uses 6 units of Novolog with each meal without carb counting. Occasionally she does miss these doses, but not frequently. She is concerned about her Lantus dose though, as her Nephrologist recently increased it from 24 units to 34 units. She is scared this will lead to hypoglycemia and so has not been using Lantus at all.   She notes she has resources available to work on her diabetes if she needs them. She endorses working with [name] in the past, and found her diet advice the most helpful. She would be interested in diet assistance again.   Assessment:  Without glucometer readings, it is difficulty to assess severity of diabetes at this moment. Her a1c is elevated at 9.5% but I suspect this is a gross underestimation due to her HD. 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   We discussed the importance of checking sugars, and slowly working up 4x per day. She is in agreement to start with checking morning sugars. I encouraged her to use Lantus daily, and if she would be more comfortable to slowly titrate from her old dose to this new one, rather than stopping Lantus completely.   To assist with DM management, Jane [name] is willing to try CCM, as well as meeting with [name] for diet advice.   Plan:  - Referral to [name] for diet assistance - CCM referral placed - Continue using 6 units of Novolog with meals TID - Restart using Lantus nightly 24 units and slow titration upwards

## 2020-02-02 NOTE — Assessment & Plan Note (Signed)
Jane [name] endorses feeling warm, dysuria x 1 day and urinary frequency. She feels symptoms are consistent with past UTI.   Assessment:  Urinalysis shows positive leukocytes. Given she is currently symptomatic, will treat as simple cystitis. Given ESRD, Bactrim was chosen as the most appropriate option and will be renally doses.   Plan:  - One time dose of DS Bactrim - Followed by additional SS Bactrim BID x 2 days

## 2020-02-02 NOTE — Assessment & Plan Note (Signed)
Jane [name] states she has been having pain on her left lateral abdomen for several days now. The pain is worsened by palpation and does radiate downwards. She denies any known triggers, including change in diet.  She describes the pain as sharp and burning.   Assessment:  On examination, pt experiences pain with even light touch in a linear region consistent with dermatone T5-6. Surrounding abdomen without palpation at this time. No rash or vesicles. Most consistent with per-herpetic neuralgia. Discussed how rashes looks and emphasized importance of calling clinic if such outbreak occurs in order to prescribe appropriate medication. Preventative measures not indicated at this time. Also discussed that Shingles is contagious, so to avoid contact with others until vesicles are scabbed and resolved.   Plan:  - Continue to monitor

## 2020-02-02 NOTE — Addendum Note (Signed)
Addended by: [name] on: 02/02/2020 06:58 PM   Modules accepted: Orders

## 2020-02-03 NOTE — Progress Notes (Signed)
Internal Medicine Clinic Attending  Case discussed with Dr. Basaraba at the time of the visit.  We reviewed the resident's history and exam and pertinent patient test results.  I agree with the assessment, diagnosis, and plan of care documented in the resident's note.    

## 2020-02-11 MED FILL — MEGESTROL 40 MG TABLET: 40 | 30 days supply | Qty: 30 | Fill #4

## 2020-02-13 NOTE — Progress Notes (Signed)
Internal Medicine Clinic Resident   I have personally reviewed this encounter including the documentation in this note and/or discussed this patient with the care management provider. I will address any urgent items identified by the care management provider and will communicate my actions to the patient's PCP. I have reviewed the patient's CCM visit with my supervising attending.  Jeanmarie Hubert, MD 02/13/2020

## 2020-02-13 NOTE — Progress Notes (Signed)
Internal Medicine Clinic Attending  CCM services provided by the care management provider and their documentation were discussed with Dr. Benjamine Mola. We reviewed the pertinent findings, urgent action items addressed by the resident and non-urgent items to be addressed by the PCP.  I agree with the assessment, diagnosis, and plan of care documented in the CCM and resident's note.  Aldine Contes, MD 02/13/2020

## 2020-02-14 ENCOUNTER — Telehealth: Payer: Medicare Other

## 2020-02-19 ENCOUNTER — Ambulatory Visit: Payer: Medicare Other | Admitting: *Deleted

## 2020-02-19 DIAGNOSIS — Z992 Dependence on renal dialysis: Secondary | ICD-10-CM

## 2020-02-19 DIAGNOSIS — N186 End stage renal disease: Secondary | ICD-10-CM

## 2020-02-19 DIAGNOSIS — E113493 Type 2 diabetes mellitus with severe nonproliferative diabetic retinopathy without macular edema, bilateral: Secondary | ICD-10-CM

## 2020-02-19 DIAGNOSIS — I10 Essential (primary) hypertension: Secondary | ICD-10-CM

## 2020-02-19 NOTE — Patient Instructions (Signed)
Visit Information It was nice speaking with you today. Please speak with [name] about the Dexcom CGM when you met with her in May if you decide you'd like to try it.  Goals Addressed            This Visit's Progress     Patient Stated   . "I need to get my diabetes under better control" (pt-stated)       CARE PLAN ENTRY (see longitudinal plan of care for additional care plan information)  Current Barriers:  . Chronic Disease Management support, education, and care coordination needs related to CAD, HTN, HLD, DMII, and ESRD- spoke briefly with patient, to follow up on consideration of trying the Dexcom CGM. Patient states she hasn't thought any more about it since our discussion on 3/19.  Clinical Goal(s) related to CAD, HTN, HLD, DMII, and ESRD:  Over the next 45 days, patient will:  . Work with the care management team to address educational, disease management, and care coordination needs  . Begin or continue self health monitoring activities as directed today  - consider trying the Dexcom CGM . Call provider office for new or worsened signs and symptoms New or worsened symptom related to DM, HTN, CAD, ESRD . Call care management team with questions or concerns . Verbalize basic understanding of patient centered plan of care established today  Interventions related to CAD, HTN, HLD, DMII, and ESRD:  . Encouraged her to speak with Debera Lat about the Dexcom CGM when she sees her in May if she is receptive to trying it . Asked and patient agreed to a follow up call after she sees Debera Lat in May  Patient Self Care Activities related to CAD, HTN, HLD, DMII, and ESRD:  . Patient is unable to independently self-manage chronic health conditions  Please see past updates related to this goal by clicking on the "Past Updates" button in the selected goal         The patient verbalized understanding of instructions provided today and declined a print copy of patient instruction  materials.   The care management team will reach out to the patient again over the next 45 days.   Kelli Churn RN, CCM, [facility] RN Care Manager [phone]

## 2020-02-19 NOTE — Chronic Care Management (AMB) (Signed)
Chronic Care Management   Follow Up Note   02/19/2020 Name: Jane [name] MRN: [id number] DOB: 12/02/1975  Referred by: [name], MD Reason for referral : Chronic Care Management (DM, ESRD)   Jane [name] is a 44 y.o. year old female who is a primary care patient of [name], MD. The CCM team was consulted for assistance with chronic disease management and care coordination needs.    Review of patient status, including review of consultants reports, relevant laboratory and other test results, and collaboration with appropriate care team members and the patient's provider was performed as part of comprehensive patient evaluation and provision of chronic care management services.    SDOH (Social Determinants of Health) assessments performed: No See Care Plan activities for detailed interventions related to [facility])     Outpatient Encounter Medications as of 02/19/2020  Medication Sig  . ACCU-CHEK FASTCLIX LANCETS MISC Check blood sugar 4x/day  . acetaminophen (TYLENOL) 500 MG tablet Take 1,000 mg by mouth every 6 (six) hours as needed (for pain.).  Marland Kitchen albuterol (PROVENTIL HFA) 108 (90 Base) MCG/ACT inhaler INHALE 2 PUFFS BY MOUTH EVERY 4 HOURS AS NEEDED FOR COUGHING, WHEEZING, OR SHORTNESS OF BREATH  . aspirin EC 81 MG tablet Take 1 tablet (81 mg total) by mouth daily.  . Blood Glucose Monitoring Suppl (ACCU-CHEK GUIDE) w/Device KIT 1 each by Does not apply route 3 (three) times daily.  . cefdinir (OMNICEF) 300 MG capsule cefdinir 300 mg capsule  . glucose blood (ACCU-CHEK GUIDE) test strip Check blood sugar up to 5 times a day  . icosapent Ethyl (VASCEPA) 1 g capsule Take 2 capsules (2 g total) by mouth 2 (two) times daily.  . insulin aspart (NOVOLOG) 100 UNIT/ML FlexPen Use 4-9U three times daily based on your carb counts and correction scale for a maximum of 24U daily  . insulin glargine (LANTUS) 100 UNIT/ML injection Inject 0.34 mLs (34 Units total) into the skin  at bedtime.  . Insulin Pen Needle 32G X 4 MM MISC Use to inject insulin 3 times a day  . Insulin Syringe-Needle U-100 31G X 15/64" 0.3 ML MISC Use to inject insulin daily  . megestrol (MEGACE) 40 MG tablet Take 1 tablet (40 mg total) by mouth daily.  . metoprolol tartrate (LOPRESSOR) 25 MG tablet Take 1 tablet (25 mg total) by mouth 2 (two) times daily. Do not take the night before dialysis, and take 2 hours after dialysis.  Marland Kitchen multivitamin (RENA-VIT) TABS tablet Take 1 tablet by mouth daily.  . promethazine (PHENERGAN) 12.5 MG tablet Take 1 tablet (12.5 mg total) by mouth every 6 (six) hours as needed for nausea or vomiting.  Marland Kitchen RENAGEL 800 MG tablet Take 4,800 mg by mouth See admin instructions. Take 4800 mg tablets three times daily with meals  . rosuvastatin (CRESTOR) 40 MG tablet Take 1 tablet (40 mg total) by mouth daily.  . sertraline (ZOLOFT) 50 MG tablet Take 2 tablets (100 mg total) by mouth daily.  Marland Kitchen venlafaxine XR (EFFEXOR-XR) 150 MG 24 hr capsule Take 1 capsule (150 mg total) by mouth daily.   No facility-administered encounter medications on file as of 02/19/2020.     Objective:   Goals Addressed            This Visit's Progress     Patient Stated   . "I need to get my diabetes under better control" (pt-stated)       CARE PLAN ENTRY (see longitudinal plan of care for  additional care plan information)  Current Barriers:  . Chronic Disease Management support, education, and care coordination needs related to CAD, HTN, HLD, DMII, and ESRD- spoke briefly with patient, to follow up on consideration of trying the Dexcom CGM. Patient states she hasn't thought any more about it since our discussion on 3/19.  Clinical Goal(s) related to CAD, HTN, HLD, DMII, and ESRD:  Over the next 45 days, patient will:  . Work with the care management team to address educational, disease management, and care coordination needs  . Begin or continue self health monitoring activities as directed  today  - consider trying the Dexcom CGM . Call provider office for new or worsened signs and symptoms New or worsened symptom related to DM, HTN, CAD, ESRD . Call care management team with questions or concerns . Verbalize basic understanding of patient centered plan of care established today  Interventions related to CAD, HTN, HLD, DMII, and ESRD:  . Encouraged her to speak with Debera Lat about the Dexcom CGM when she sees her in May if she is receptive to trying it . Asked and patient agreed to a follow up call after she sees Debera Lat in May  Patient Self Care Activities related to CAD, HTN, HLD, DMII, and ESRD:  . Patient is unable to independently self-manage chronic health conditions  Please see past updates related to this goal by clicking on the "Past Updates" button in the selected goal          Plan:   The care management team will reach out to the patient again over the next 45 days.    Kelli Churn RN, CCM, [facility] RN Care Manager [phone]

## 2020-02-20 NOTE — Progress Notes (Signed)
Internal Medicine Clinic Resident   I have personally reviewed this encounter including the documentation in this note and/or discussed this patient with the care management provider. I will address any urgent items identified by the care management provider and will communicate my actions to the patient's PCP. I have reviewed the patient's CCM visit with my supervising attending.  Delice Bison, DO 02/20/2020

## 2020-02-21 NOTE — Progress Notes (Signed)
Internal Medicine Clinic Attending  CCM services provided by the care management provider and their documentation were discussed with Dr. Koleen Distance. We reviewed the pertinent findings, urgent action items addressed by the resident and non-urgent items to be addressed by the PCP.  I agree with the assessment, diagnosis, and plan of care documented in the CCM and resident's note.  Lucious Groves, DO 02/21/2020

## 2020-03-17 MED FILL — MEGESTROL 40 MG TABLET: 40 | 30 days supply | Qty: 30 | Fill #5

## 2020-03-25 ENCOUNTER — Encounter: Payer: Medicare Other | Admitting: Internal Medicine

## 2020-03-25 ENCOUNTER — Encounter: Payer: Medicare Other | Admitting: Dietician

## 2020-04-01 ENCOUNTER — Ambulatory Visit (HOSPITAL_COMMUNITY)
Admission: RE | Admit: 2020-04-01 | Discharge: 2020-04-01 | Disposition: A | Discharge status: Home / Self Care | Payer: Medicare Other | Source: Ambulatory Visit | Attending: Internal Medicine | Admitting: Internal Medicine

## 2020-04-01 ENCOUNTER — Ambulatory Visit (INDEPENDENT_AMBULATORY_CARE_PROVIDER_SITE_OTHER): Payer: Medicare Other | Admitting: Internal Medicine

## 2020-04-01 ENCOUNTER — Telehealth: Payer: Medicare Other

## 2020-04-01 ENCOUNTER — Other Ambulatory Visit: Payer: Self-pay

## 2020-04-01 VITALS — BP 148/86 | HR 92 | Temp 98.4°F | Wt 226.1 lb

## 2020-04-01 DIAGNOSIS — E113493 Type 2 diabetes mellitus with severe nonproliferative diabetic retinopathy without macular edema, bilateral: Secondary | ICD-10-CM | POA: Diagnosis not present

## 2020-04-01 DIAGNOSIS — I208 Other forms of angina pectoris: Secondary | ICD-10-CM | POA: Diagnosis not present

## 2020-04-01 DIAGNOSIS — Z992 Dependence on renal dialysis: Secondary | ICD-10-CM

## 2020-04-01 DIAGNOSIS — Z794 Long term (current) use of insulin: Secondary | ICD-10-CM | POA: Diagnosis not present

## 2020-04-01 DIAGNOSIS — F329 Major depressive disorder, single episode, unspecified: Secondary | ICD-10-CM | POA: Diagnosis not present

## 2020-04-01 DIAGNOSIS — F321 Major depressive disorder, single episode, moderate: Secondary | ICD-10-CM

## 2020-04-01 DIAGNOSIS — R0789 Other chest pain: Secondary | ICD-10-CM | POA: Diagnosis present

## 2020-04-01 DIAGNOSIS — N186 End stage renal disease: Secondary | ICD-10-CM

## 2020-04-01 MED ORDER — INSULIN GLARGINE 100 UNIT/ML ~~LOC~~ SOLN: 24.0000 [IU] | Freq: Every day | SUBCUTANEOUS | 3 refills | Status: DC

## 2020-04-01 MED ORDER — BUPROPION HCL ER (SR) 100 MG PO TB12: 100.0000 mg | ORAL_TABLET | Freq: Two times a day (BID) | ORAL | 1 refills | Status: DC
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
## 2020-04-01 NOTE — Patient Instructions (Addendum)
Thank you for allowing [place] to provide your care today. Today we discussed your chest discomfort    Today we made the following changes to your medications.    Please start buproprion 100mg  twice daily Please decrease your lantus to 20 units at bedtime Please make sure to go to your dialysis sessions Please ensure you take your aspirin every day  Please follow-up in 4 weeks.    Should you have any questions or concerns please call the internal medicine clinic at [phone].    Bupropion tablets (Depression/Mood Disorders) What is this medicine? BUPROPION (byoo PROE pee on) is used to treat depression. This medicine may be used for other purposes; ask your health care provider or pharmacist if you have questions. COMMON BRAND NAME(S): Wellbutrin What should I tell my health care provider before I take this medicine? They need to know if you have any of these conditions:  an eating disorder, such as anorexia or bulimia  bipolar disorder or psychosis  diabetes or high blood sugar, treated with medication  glaucoma  heart disease, previous heart attack, or irregular heart beat  head injury or brain tumor  high blood pressure  kidney or liver disease  seizures  suicidal thoughts or a previous suicide attempt  Tourette's syndrome  weight loss  an unusual or allergic reaction to bupropion, other medicines, foods, dyes, or preservatives  breast-feeding  pregnant or trying to become pregnant How should I use this medicine? Take this medicine by mouth with a glass of water. Follow the directions on the prescription label. You can take it with or without food. If it upsets your stomach, take it with food. Take your medicine at regular intervals. Do not take your medicine more often than directed. Do not stop taking this medicine suddenly except upon the advice of your doctor. Stopping this medicine too quickly may cause serious side effects or your condition may worsen. A  special MedGuide will be given to you by the pharmacist with each prescription and refill. Be sure to read this information carefully each time. Talk to your pediatrician regarding the use of this medicine in children. Special care may be needed. Overdosage: If you think you have taken too much of this medicine contact a poison control center or emergency room at once. NOTE: This medicine is only for you. Do not share this medicine with others. What if I miss a dose? If you miss a dose, take it as soon as you can. If it is less than four hours to your next dose, take only that dose and skip the missed dose. Do not take double or extra doses. What may interact with this medicine? Do not take this medicine with any of the following medications:  linezolid  MAOIs like Azilect, Carbex, Eldepryl, Marplan, Nardil, and Parnate  methylene blue (injected into a vein)  other medicines that contain bupropion like Zyban This medicine may also interact with the following medications:  alcohol  certain medicines for anxiety or sleep  certain medicines for blood pressure like metoprolol, propranolol  certain medicines for depression or psychotic disturbances  certain medicines for HIV or AIDS like efavirenz, lopinavir, nelfinavir, ritonavir  certain medicines for irregular heart beat like propafenone, flecainide  certain medicines for Parkinson's disease like amantadine, levodopa  certain medicines for seizures like carbamazepine, phenytoin, phenobarbital  cimetidine  clopidogrel  cyclophosphamide  digoxin  furazolidone  isoniazid  nicotine  orphenadrine  procarbazine  steroid medicines like prednisone or cortisone  stimulant medicines for  attention disorders, weight loss, or to stay awake  tamoxifen  theophylline  thiotepa  ticlopidine  tramadol  warfarin This list may not describe all possible interactions. Give your health care provider a list of all the  medicines, herbs, non-prescription drugs, or dietary supplements you use. Also tell them if you smoke, drink alcohol, or use illegal drugs. Some items may interact with your medicine. What should I watch for while using this medicine? Tell your doctor if your symptoms do not get better or if they get worse. Visit your doctor or healthcare provider for regular checks on your progress. Because it may take several weeks to see the full effects of this medicine, it is important to continue your treatment as prescribed by your doctor. This medicine may cause serious skin reactions. They can happen weeks to months after starting the medicine. Contact your healthcare provider right away if you notice fevers or flu-like symptoms with a rash. The rash may be red or purple and then turn into blisters or peeling of the skin. Or, you might notice a red rash with swelling of the face, lips or lymph nodes in your neck or under your arms. Patients and their families should watch out for new or worsening thoughts of suicide or depression. Also watch out for sudden changes in feelings such as feeling anxious, agitated, panicky, irritable, hostile, aggressive, impulsive, severely restless, overly excited and hyperactive, or not being able to sleep. If this happens, especially at the beginning of treatment or after a change in dose, call your healthcare provider. Avoid alcoholic drinks while taking this medicine. Drinking excessive alcoholic beverages, using sleeping or anxiety medicines, or quickly stopping the use of these agents while taking this medicine may increase your risk for a seizure. Do not drive or use heavy machinery until you know how this medicine affects you. This medicine can impair your ability to perform these tasks. Do not take this medicine close to bedtime. It may prevent you from sleeping. Your mouth may get dry. Chewing sugarless gum or sucking hard candy, and drinking plenty of water may help. Contact  your doctor if the problem does not go away or is severe. What side effects may I notice from receiving this medicine? Side effects that you should report to your doctor or health care professional as soon as possible:  allergic reactions like skin rash, itching or hives, swelling of the face, lips, or tongue  breathing problems  changes in vision  confusion  elevated mood, decreased need for sleep, racing thoughts, impulsive behavior  fast or irregular heartbeat  hallucinations, loss of contact with reality  increased blood pressure  rash, fever, and swollen lymph nodes  redness, blistering, peeling, or loosening of the skin, including inside the mouth  seizures  suicidal thoughts or other mood changes  unusually weak or tired  vomiting Side effects that usually do not require medical attention (report to your doctor or health care professional if they continue or are bothersome):  constipation  headache  loss of appetite  nausea  tremors  weight loss This list may not describe all possible side effects. Call your doctor for medical advice about side effects. You may report side effects to FDA at 1-800-FDA-1088. Where should I keep my medicine? Keep out of the reach of children. Store at room temperature between 20 and 25 degrees C (68 and 77 degrees F), away from direct sunlight and moisture. Keep tightly closed. Throw away any unused medicine after the expiration date.  NOTE: This sheet is a summary. It may not cover all possible information. If you have questions about this medicine, talk to your doctor, pharmacist, or health care provider.  2020 Elsevier/Gold Standard (2019-01-24 14:02:47)

## 2020-04-02 ENCOUNTER — Encounter: Payer: Self-pay | Admitting: Internal Medicine

## 2020-04-02 MED ORDER — INSULIN GLARGINE 100 UNIT/ML ~~LOC~~ SOLN: 20.0000 [IU] | Freq: Every day | SUBCUTANEOUS | 3 refills | Status: DC

## 2020-04-02 MED FILL — buPROPion HCL ER (SR) 100 M: 100 | 30 days supply | Qty: 60 | Fill #0
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
## 2020-04-02 NOTE — Assessment & Plan Note (Addendum)
Lab Results  Component Value Date   HGBA1C 9.5 (A) 01/29/2020   States that she has not been using CGMs due to her depression and not having motivation to check her blood sugars. Also states not taking her long-acting insulin and taking only her meal time due to fear of hypoglycemia, especially due to her loss of appetite from depression. Denies polyphagia, polydipsia nausea or vomiting  - Discussed importance of medication adherence and blood glucose monitoring - Advised to restart long-acting insulin at lower dose (previously on 24 units qhs, advised to start 20 units) - C/w novolog 6 units TID qc - F/u in 4 weeks w/ PCP

## 2020-04-03 ENCOUNTER — Telehealth: Payer: Medicare Other

## 2020-04-03 NOTE — Progress Notes (Signed)
Internal Medicine Clinic Attending ° °Case discussed with Dr. Lee at the time of the visit.  We reviewed the resident’s history and exam and pertinent patient test results.  I agree with the assessment, diagnosis, and plan of care documented in the resident’s note.  °

## 2020-04-06 LAB — HM DIABETES EYE EXAM

## 2020-04-07 ENCOUNTER — Telehealth (INDEPENDENT_AMBULATORY_CARE_PROVIDER_SITE_OTHER): Payer: Medicare Other | Admitting: Lactation Services

## 2020-04-07 ENCOUNTER — Other Ambulatory Visit: Payer: Self-pay

## 2020-04-07 ENCOUNTER — Encounter: Payer: Self-pay | Admitting: *Deleted

## 2020-04-07 ENCOUNTER — Telehealth: Payer: Self-pay | Admitting: Internal Medicine

## 2020-04-07 ENCOUNTER — Ambulatory Visit (INDEPENDENT_AMBULATORY_CARE_PROVIDER_SITE_OTHER): Payer: Medicare Other | Admitting: Family Medicine

## 2020-04-07 ENCOUNTER — Encounter: Payer: Self-pay | Admitting: Family Medicine

## 2020-04-07 VITALS — BP 122/79 | HR 99 | Wt 219.8 lb

## 2020-04-07 DIAGNOSIS — N938 Other specified abnormal uterine and vaginal bleeding: Secondary | ICD-10-CM

## 2020-04-07 DIAGNOSIS — N939 Abnormal uterine and vaginal bleeding, unspecified: Secondary | ICD-10-CM

## 2020-04-07 NOTE — Progress Notes (Signed)
GYNECOLOGY OFFICE NOTE  History:  44 y.o. [id number] here today for abnormal uterine bleeding. She started this cycle about 2 weeks ago. It started out light, and then she noticed large clots (size of 3 grapes) on Friday and Saturday. It has decreased since then. She uses panty liners and tissues, was doubling up on panty liners at least 3-4 times a day on Friday and Saturday. She has had heavy and irregular periods for several years, saw Dr. Ihor Dow in January 2020 and was put on Megace. Pelvic US at that time showed a 3 mm endometrial stripe.  She is still taking Megace, and is bleeding despite that. She has been on Megace for about 4 years now. She was told that another options would include either a uterine ablation or hysterectomy, which she is now interested in pursuing. She had a BTL in 1997.  Of note, she has recently been started on dialysis for end-stage renal disease. Her most recent recent CBC shows hemoglobin of 11.4.  Past Medical History:  Diagnosis Date  . Asthma    prn inhaler  . CAD S/P BMS PCI to prox LAD 03/31/2016   Ost LAD to Mid LAD lesion, 90% stenosed. Post intervention - Vision BMS 3.0 mm x 18 mm (~3.5 mm) there is a 0% residual stenosis.   . Charcot foot due to diabetes mellitus ([place]) 11/2016   left 2018; right 2019  . Depression   . Diabetic neuropathy (HCC)    feet  . ESRD (end stage renal disease) on dialysis ([place]) 12/2017   ?chronic interstitial nephritis  . H/O non-ST elevation myocardial infarction (NSTEMI) 03/2016   Found in 90% mid LAD lesion treated with bare-metal stent (BMS) PCI - vision BMS 3.0 mm x 18 mm  . History of MRSA infection   . Hyperlipidemia   . Hypertension    medication dose increased 11/22/2015; has been taking med. consistently since 03/2016, per pt.  . Insulin dependent diabetes mellitus   . Iron deficiency anemia    takes iron supplement  . Retinopathy of both eyes   . Sickle cell trait ([place])   . 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 ACHILLES TENDON LENGTHENING Left 11/24/2016   Procedure: Left Achilles tendon lengthening (open);  Surgeon: Wylene Simmer, MD;  Location: [facility];  Service: Orthopedics;  Laterality: Left;  . AV FISTULA PLACEMENT Right 07/31/2017   Procedure: ARTERIOVENOUS BRACHIOCEPHALIC FISTULA CREATION RIGHT ARM;  Surgeon: Conrad Rockwood, MD;  Location: [facility];  Service: Vascular;  Laterality: Right;  . CALCANEAL OSTEOTOMY Left 11/24/2016   Procedure: Left hindfoot osteotomy and fusion;  Surgeon: Wylene Simmer, MD;  Location: [facility];  Service: Orthopedics;  Laterality: Left;  . CARDIAC CATHETERIZATION N/A 03/31/2016   Procedure: Left Heart Cath and Coronary Angiography;  Surgeon: Lorretta Harp, MD;  Location: [facility] INVASIVE CV LAB: 90% early mLAD, normal LV Fxn  . CARDIAC CATHETERIZATION N/A 03/31/2016   Procedure: Coronary Stent Intervention;  Surgeon: Lorretta Harp, MD;  Location: [place] CV LAB;  Service: Cardiovascular: PCI to mLAD BMS Vision 3.0 mm x 18 mm  . CESAREAN SECTION  1997  . CORONARY ANGIOPLASTY    . FISTULA SUPERFICIALIZATION Right 11/15/2017   Procedure: FISTULA SUPERFICIALIZATION RIGHT BRACHIOCEPHALIC  RIGHT;  Surgeon: Conrad Middleville, MD;  Location: [place];  Service: Vascular;  Laterality: Right;  . NM MYOVIEW LTD  08/2018   LOW  RISK. EF 55-65%. No ischemia or Infarction  . TRANSTHORACIC ECHOCARDIOGRAM  03/2016   Normal LV size and thickness. EF 60-65%. GR 1 DD. Otherwise essentially normal.  . TUBAL LIGATION  1997     Current Outpatient Medications:  .  ACCU-CHEK FASTCLIX LANCETS MISC, Check blood sugar 4x/day, Disp: 204 each, Rfl: 5 .  acetaminophen (TYLENOL) 500 MG tablet, Take 1,000 mg by mouth every 6 (six) hours as needed (for pain.)., Disp: , Rfl:  .  albuterol (PROVENTIL HFA) 108 (90 Base) MCG/ACT inhaler, INHALE 2 PUFFS BY MOUTH EVERY 4 HOURS AS NEEDED FOR COUGHING, WHEEZING,  OR SHORTNESS OF BREATH, Disp: 20.1 g, Rfl: 2 .  aspirin EC 81 MG tablet, Take 1 tablet (81 mg total) by mouth daily., Disp: 90 tablet, Rfl: 3 .  Blood Glucose Monitoring Suppl (ACCU-CHEK GUIDE) w/Device KIT, 1 each by Does not apply route 3 (three) times daily., Disp: 1 kit, Rfl: 0 .  buPROPion (WELLBUTRIN SR) 100 MG 12 hr tablet, Take 1 tablet (100 mg total) by mouth 2 (two) times daily., Disp: 60 tablet, Rfl: 1 .  cefdinir (OMNICEF) 300 MG capsule, cefdinir 300 mg capsule, Disp: , Rfl:  .  glucose blood (ACCU-CHEK GUIDE) test strip, Check blood sugar up to 5 times a day, Disp: 450 each, Rfl: 3 .  icosapent Ethyl (VASCEPA) 1 g capsule, Take 2 capsules (2 g total) by mouth 2 (two) times daily., Disp: 120 capsule, Rfl: 11 .  insulin aspart (NOVOLOG) 100 UNIT/ML FlexPen, Use 4-9U three times daily based on your carb counts and correction scale for a maximum of 24U daily, Disp: 15 mL, Rfl: 3 .  insulin glargine (LANTUS) 100 UNIT/ML injection, Inject 0.2 mLs (20 Units total) into the skin at bedtime., Disp: 30 mL, Rfl: 3 .  Insulin Pen Needle 32G X 4 MM MISC, Use to inject insulin 3 times a day, Disp: 300 each, Rfl: 3 .  Insulin Syringe-Needle U-100 31G X 15/64" 0.3 ML MISC, Use to inject insulin daily, Disp: 100 each, Rfl: 3 .  megestrol (MEGACE) 40 MG tablet, Take 1 tablet (40 mg total) by mouth daily., Disp: 30 tablet, Rfl: 6 .  multivitamin (RENA-VIT) TABS tablet, Take 1 tablet by mouth daily., Disp: , Rfl: 3 .  RENAGEL 800 MG tablet, Take 4,800 mg by mouth See admin instructions. Take 4800 mg tablets three times daily with meals, Disp: , Rfl: 11 .  rosuvastatin (CRESTOR) 40 MG tablet, Take 1 tablet (40 mg total) by mouth daily., Disp: 90 tablet, Rfl: 3 .  sertraline (ZOLOFT) 50 MG tablet, Take 2 tablets (100 mg total) by mouth daily., Disp: 180 tablet, Rfl: 0 .  metoprolol tartrate (LOPRESSOR) 25 MG tablet, Take 1 tablet (25 mg total) by mouth 2 (two) times daily. Do not take the night before  dialysis, and take 2 hours after dialysis., Disp: 180 tablet, Rfl: 3  The following portions of the patient's history were reviewed and updated as appropriate: allergies, current medications, past family history, past medical history, past social history, past surgical history and problem list.   Review of Systems:  Pertinent items noted in HPI and remainder of comprehensive ROS otherwise negative.   Objective:  Physical Exam BP 122/79   Pulse 99   Wt 219 lb 12.8 oz (99.7 kg)   BMI 36.58 kg/m  CONSTITUTIONAL: Well-developed, well-nourished female in no acute distress.  HENT:  Normocephalic, atraumatic. External right and left ear normal. Oropharynx is clear and moist EYES: Conjunctivae and EOM  are normal. Pupils are equal, round, and reactive to light. No scleral icterus.  NECK: Normal range of motion, supple, no masses SKIN: Skin is warm and dry. No rash noted. Not diaphoretic. No erythema. No pallor. NEUROLOGIC: Alert and oriented to person, place, and time. Normal reflexes, muscle tone coordination. No cranial nerve deficit noted. PSYCHIATRIC: Normal mood and affect. Normal behavior. Normal judgment and thought content. CARDIOVASCULAR: Normal heart rate noted RESPIRATORY: Effort and breath sounds normal, no problems with respiration noted ABDOMEN: Soft, no distention noted.  Previous laparoscopy and and Cesarean scars well healed. PELVIC: Deferred per patient request MUSCULOSKELETAL: Normal range of motion. No edema noted.  Exam done with chaperone present.  Labs and Imaging No results found.  Assessment & Plan:  1. Abnormal uterine bleeding (AUB) - Persisting despite Megace - Interested in permanent options: ablation vs hysterectomy - US Transvaginal Non-OB; Future  Routine preventative health maintenance measures emphasized. Please refer to After Visit Summary for other counseling recommendations.   Return for 1-2 weeks with MD ATTENDING, consult for ablation vs  hysterectomy.  Total face-to-face time with patient: 17 minutes. Over 50% of encounter was spent on counseling and coordination of care.  Merilyn Baba, DO OB Fellow, [facility] 04/07/2020 11:58 AM

## 2020-04-07 NOTE — Progress Notes (Signed)
PHQ-9 and GAD-7 positive. Pt reports continuing Zoloft and recently beginning Wellbutrin. States her energy levels have increased since beginning Wellbutrin. Pt is managed by primary care office.  Apolonio Schneiders RN 04/07/20

## 2020-04-07 NOTE — Telephone Encounter (Signed)
Patient called in to report that she was informed at dialysis this morning that her Hgb is 7.   She reports she has been experiencing heavy bleeding with large clots. She reports that the bleeding has slowed some but still happening. She was changing her pad every 30 minutes when at her heaviest bleeding.   She is taking her Megace as prescribed.   Patient was made an appointment to come in this morning to be seen.

## 2020-04-07 NOTE — Telephone Encounter (Signed)
Pt contact regarding referral OBGYN [phone]

## 2020-04-07 NOTE — Telephone Encounter (Signed)
Pt has called [facility] and is sch today.

## 2020-04-07 NOTE — Patient Instructions (Signed)
Endometrial Ablation  Endometrial ablation is a procedure that destroys the thin inner layer of the lining of the uterus (endometrium). This procedure may be done:  To stop heavy periods.  To stop bleeding that is causing anemia.  To control irregular bleeding.  To treat bleeding caused by small tumors (fibroids) in the endometrium.  This procedure is often an alternative to major surgery, such as removal of the uterus and cervix (hysterectomy). As a result of this procedure:  You may not be able to have children. However, if you are premenopausal (you have not gone through menopause):  You may still have a small chance of getting pregnant.  You will need to use a reliable method of birth control after the procedure to prevent pregnancy.  You may stop having a menstrual period, or you may have only a small amount of bleeding during your period. Menstruation may return several years after the procedure.  Tell a health care provider about:  Any allergies you have.  All medicines you are taking, including vitamins, herbs, eye drops, creams, and over-the-counter medicines.  Any problems you or family members have had with the use of anesthetic medicines.  Any blood disorders you have.  Any surgeries you have had.  Any medical conditions you have.  What are the risks?  Generally, this is a safe procedure. However, problems may occur, including:  A hole (perforation) in the uterus or bowel.  Infection of the uterus, bladder, or vagina.  Bleeding.  Damage to other structures or organs.  An air bubble in the lung (air embolus).  Problems with pregnancy after the procedure.  Failure of the procedure.  Decreased ability to diagnose cancer in the endometrium.  What happens before the procedure?  You will have tests of your endometrium to make sure there are no pre-cancerous cells or cancer cells present.  You may have an ultrasound of the uterus.  You may be given medicines to thin the endometrium.  Ask your health care  provider about:  Changing or stopping your regular medicines. This is especially important if you take diabetes medicines or blood thinners.  Taking medicines such as aspirin and ibuprofen. These medicines can thin your blood. Do not take these medicines before your procedure if your doctor tells you not to.  Plan to have someone take you home from the hospital or clinic.  What happens during the procedure?    You will lie on an exam table with your feet and legs supported as in a pelvic exam.  To lower your risk of infection:  Your health care team will wash or sanitize their hands and put on germ-free (sterile) gloves.  Your genital area will be washed with soap.  An IV tube will be inserted into one of your veins.  You will be given a medicine to help you relax (sedative).  A surgical instrument with a light and camera (resectoscope) will be inserted into your vagina and moved into your uterus. This allows your surgeon to see inside your uterus.  Endometrial tissue will be removed using one of the following methods:  Radiofrequency. This method uses a radiofrequency-alternating electric current to remove the endometrium.  Cryotherapy. This method uses extreme cold to freeze the endometrium.  Heated-free liquid. This method uses a heated saltwater (saline) solution to remove the endometrium.  Microwave. This method uses high-energy microwaves to heat up the endometrium and remove it.  Thermal balloon. This method involves inserting a catheter with a balloon tip into   the uterus. The balloon tip is filled with heated fluid to remove the endometrium.  The procedure may vary among health care providers and hospitals.  What happens after the procedure?  Your blood pressure, heart rate, breathing rate, and blood oxygen level will be monitored until the medicines you were given have worn off.  As tissue healing occurs, you may notice vaginal bleeding for 4-6 weeks after the procedure. You may also  experience:  Cramps.  Thin, watery vaginal discharge that is light pink or brown in color.  A need to urinate more frequently than usual.  Nausea.  Do not drive for 24 hours if you were given a sedative.  Do not have sex or insert anything into your vagina until your health care provider approves.  Summary  Endometrial ablation is done to treat the many causes of heavy menstrual bleeding.  The procedure may be done only after medications have been tried to control the bleeding.  Plan to have someone take you home from the hospital or clinic.  This information is not intended to replace advice given to you by your health care provider. Make sure you discuss any questions you have with your health care provider.  Document Revised: 04/17/2018 Document Reviewed: 11/17/2016  Elsevier Patient Education  2020 [employer]

## 2020-04-08 ENCOUNTER — Ambulatory Visit: Payer: Medicare Other | Admitting: *Deleted

## 2020-04-08 ENCOUNTER — Telehealth: Payer: Medicare Other

## 2020-04-08 ENCOUNTER — Ambulatory Visit: Payer: Medicare Other

## 2020-04-08 DIAGNOSIS — Z992 Dependence on renal dialysis: Secondary | ICD-10-CM

## 2020-04-08 DIAGNOSIS — N186 End stage renal disease: Secondary | ICD-10-CM

## 2020-04-08 DIAGNOSIS — E113493 Type 2 diabetes mellitus with severe nonproliferative diabetic retinopathy without macular edema, bilateral: Secondary | ICD-10-CM

## 2020-04-08 NOTE — Progress Notes (Signed)
Internal Medicine Clinic Resident  I have personally reviewed this encounter including the documentation in this note and/or discussed this patient with the care management provider. I will address any urgent items identified by the care management provider and will communicate my actions to the patient's PCP. I have reviewed the patient's CCM visit with my supervising attending, Dr Vincent.  Cheyenna Pankowski, MD 04/08/2020    

## 2020-04-08 NOTE — Chronic Care Management (AMB) (Signed)
Chronic Care Management   Follow Up Note   04/08/2020 Name: Jane [name] MRN: [id number] DOB: 07/17/1976  Referred by: [name], MD Reason for referral : Chronic Care Management (DM, ESRD, CAD,HTN, HLD)   LATESE [name] is a 44 y.o. year old female who is a primary care patient of [name], MD. The CCM team was consulted for assistance with chronic disease management and care coordination needs.    Review of patient status, including review of consultants reports, relevant laboratory and other test results, and collaboration with appropriate care team members and the patient's provider was performed as part of comprehensive patient evaluation and provision of chronic care management services.    SDOH (Social Determinants of Health) assessments performed: No See Care Plan activities for detailed interventions related to [facility])     Outpatient Encounter Medications as of 04/08/2020  Medication Sig  . ACCU-CHEK FASTCLIX LANCETS MISC Check blood sugar 4x/day  . acetaminophen (TYLENOL) 500 MG tablet Take 1,000 mg by mouth every 6 (six) hours as needed (for pain.).  Marland Kitchen albuterol (PROVENTIL HFA) 108 (90 Base) MCG/ACT inhaler INHALE 2 PUFFS BY MOUTH EVERY 4 HOURS AS NEEDED FOR COUGHING, WHEEZING, OR SHORTNESS OF BREATH  . aspirin EC 81 MG tablet Take 1 tablet (81 mg total) by mouth daily.  . Blood Glucose Monitoring Suppl (ACCU-CHEK GUIDE) w/Device KIT 1 each by Does not apply route 3 (three) times daily.  Marland Kitchen buPROPion (WELLBUTRIN SR) 100 MG 12 hr tablet Take 1 tablet (100 mg total) by mouth 2 (two) times daily.  . cefdinir (OMNICEF) 300 MG capsule cefdinir 300 mg capsule  . glucose blood (ACCU-CHEK GUIDE) test strip Check blood sugar up to 5 times a day  . icosapent Ethyl (VASCEPA) 1 g capsule Take 2 capsules (2 g total) by mouth 2 (two) times daily.  . insulin aspart (NOVOLOG) 100 UNIT/ML FlexPen Use 4-9U three times daily based on your carb counts and correction scale  for a maximum of 24U daily  . insulin glargine (LANTUS) 100 UNIT/ML injection Inject 0.2 mLs (20 Units total) into the skin at bedtime.  . Insulin Pen Needle 32G X 4 MM MISC Use to inject insulin 3 times a day  . Insulin Syringe-Needle U-100 31G X 15/64" 0.3 ML MISC Use to inject insulin daily  . megestrol (MEGACE) 40 MG tablet Take 1 tablet (40 mg total) by mouth daily.  . metoprolol tartrate (LOPRESSOR) 25 MG tablet Take 1 tablet (25 mg total) by mouth 2 (two) times daily. Do not take the night before dialysis, and take 2 hours after dialysis.  Marland Kitchen multivitamin (RENA-VIT) TABS tablet Take 1 tablet by mouth daily.  Marland Kitchen RENAGEL 800 MG tablet Take 4,800 mg by mouth See admin instructions. Take 4800 mg tablets three times daily with meals  . rosuvastatin (CRESTOR) 40 MG tablet Take 1 tablet (40 mg total) by mouth daily.  . sertraline (ZOLOFT) 50 MG tablet Take 2 tablets (100 mg total) by mouth daily.   No facility-administered encounter medications on file as of 04/08/2020.     Objective:  Lab Results  Component Value Date   HGBA1C 9.5 (A) 01/29/2020   HGBA1C 8.2 (A) 11/29/2018   HGBA1C 9.1 (A) 08/01/2018   Lab Results  Component Value Date   MICROALBUR 40.6 (H) 08/05/2014   LDLCALC 100 (H) 10/15/2019   CREATININE 11.87 (H) 11/29/2018   Lab Results  Component Value Date   HGBA1C 9.5 (A) 01/29/2020   Wt Readings from Last 3  Encounters:  04/07/20 219 lb 12.8 oz (99.7 kg)  04/01/20 226 lb 1.6 oz (102.6 kg)  01/29/20 227 lb 9.6 oz (103.2 kg)   BP Readings from Last 3 Encounters:  04/07/20 122/79  04/01/20 (!) 148/86  01/29/20 (!) 133/49   Lab Results  Component Value Date   CHOL 186 10/15/2019   HDL 44 10/15/2019   LDLCALC 100 (H) 10/15/2019   TRIG 249 (H) 10/15/2019   CHOLHDL 4.2 10/15/2019    Goals Addressed            This Visit's Progress     Patient Stated   . "I need to get my diabetes under better control" (pt-stated)       CARE PLAN ENTRY (see longitudinal  plan of care for additional care plan information)  Current Barriers:  . Chronic Disease Management support, education, and care coordination needs related to CAD, HTN, HLD, DMII, and ESRD- patient states she has no needs this CCM RN can assist with at present but does requests ongoing follow up calls  Clinical Goal(s) related to CAD, HTN, HLD, DMII, and ESRD:  Over the next 45 days, patient will:  . Work with the care management team to address educational, disease management, and care coordination needs  . Begin or continue self health monitoring activities as directed today  - consider trying the Dexcom CGM . Call provider office for new or worsened signs and symptoms New or worsened symptom related to DM, HTN, CAD, ESRD . Call care management team with questions or concerns . Verbalize basic understanding of patient centered plan of care established today  Interventions related to CAD, HTN, HLD, DMII, and ESRD:  . Acknowledged that she has a long standing relationship with Debera Lat for diabetes self management assistance and reminded patient this CCM RN can provide additional support and education  and issues specific to case management needs . Acknowledged patient's current gynecological issues and home stressors; offered assistance and provided emotional support . Encouraged patient to contact this CCM RN for any questions or concerns that may arise before next month's call  Patient Self Care Activities related to CAD, HTN, HLD, DMII, and ESRD:  . Patient is unable to independently self-manage chronic health conditions  Please see past updates related to this goal by clicking on the "Past Updates" button in the selected goal          Plan:   The care management team will reach out to the patient again over the next 30 days.    Kelli Churn RN, CCM, [facility] RN Care Manager [phone]

## 2020-04-08 NOTE — Progress Notes (Signed)
Internal Medicine Clinic Attending  CCM services provided by the care management provider and their documentation were discussed with Dr. Chundi. We reviewed the pertinent findings, urgent action items addressed by the resident and non-urgent items to be addressed by the PCP.  I agree with the assessment, diagnosis, and plan of care documented in the CCM and resident's note.  Mayo Faulk Thomas Eli Adami, MD 04/08/2020  

## 2020-04-08 NOTE — Patient Instructions (Signed)
Visit Information It was nice speaking with you today. Please contact me for any care management questions or concerns that arise before next month's call.   Goals Addressed            This Visit's Progress     Patient Stated   . "I need to get my diabetes under better control" (pt-stated)       CARE PLAN ENTRY (see longitudinal plan of care for additional care plan information)  Current Barriers:  . Chronic Disease Management support, education, and care coordination needs related to CAD, HTN, HLD, DMII, and ESRD- patient states she has no needs this CCM RN can assist with at present but does requests ongoing follow up calls  Clinical Goal(s) related to CAD, HTN, HLD, DMII, and ESRD:  Over the next 45 days, patient will:  . Work with the care management team to address educational, disease management, and care coordination needs  . Begin or continue self health monitoring activities as directed today  - consider trying the Dexcom CGM . Call provider office for new or worsened signs and symptoms New or worsened symptom related to DM, HTN, CAD, ESRD . Call care management team with questions or concerns . Verbalize basic understanding of patient centered plan of care established today  Interventions related to CAD, HTN, HLD, DMII, and ESRD:  . Acknowledged that she has a long standing relationship with Debera Lat for diabetes self management assistance and reminded patient this CCM RN can provide additional support and education  and issues specific to case management needs . Acknowledged patient's current gynecological issues and home stressors; offered assistance and provided emotional support . Encouraged patient to contact this CCM RN for any questions or concerns that may arise before next month's call  Patient Self Care Activities related to CAD, HTN, HLD, DMII, and ESRD:  . Patient is unable to independently self-manage chronic health conditions  Please see past updates  related to this goal by clicking on the "Past Updates" button in the selected goal         The patient verbalized understanding of instructions provided today and declined a print copy of patient instruction materials.   The care management team will reach out to the patient again over the next 30 days.   Kelli Churn RN, CCM, [facility] RN Care Manager [phone]

## 2020-04-15 ENCOUNTER — Encounter: Payer: Self-pay | Admitting: Internal Medicine

## 2020-04-15 ENCOUNTER — Ambulatory Visit (INDEPENDENT_AMBULATORY_CARE_PROVIDER_SITE_OTHER): Payer: Medicare Other | Admitting: Internal Medicine

## 2020-04-15 ENCOUNTER — Telehealth: Payer: Medicare Other

## 2020-04-15 ENCOUNTER — Other Ambulatory Visit: Payer: Self-pay

## 2020-04-15 ENCOUNTER — Ambulatory Visit
Admission: RE | Admit: 2020-04-15 | Discharge: 2020-04-15 | Disposition: A | Discharge status: Home / Self Care | Payer: Medicare Other | Source: Ambulatory Visit | Attending: Family Medicine | Admitting: Family Medicine

## 2020-04-15 VITALS — BP 135/62 | HR 78 | Temp 98.6°F | Ht 65.0 in | Wt 225.2 lb

## 2020-04-15 DIAGNOSIS — N939 Abnormal uterine and vaginal bleeding, unspecified: Secondary | ICD-10-CM | POA: Insufficient documentation

## 2020-04-15 DIAGNOSIS — I10 Essential (primary) hypertension: Secondary | ICD-10-CM

## 2020-04-15 DIAGNOSIS — E113493 Type 2 diabetes mellitus with severe nonproliferative diabetic retinopathy without macular edema, bilateral: Secondary | ICD-10-CM | POA: Diagnosis not present

## 2020-04-15 DIAGNOSIS — J301 Allergic rhinitis due to pollen: Secondary | ICD-10-CM | POA: Diagnosis not present

## 2020-04-15 DIAGNOSIS — F321 Major depressive disorder, single episode, moderate: Secondary | ICD-10-CM

## 2020-04-15 DIAGNOSIS — R05 Cough: Secondary | ICD-10-CM

## 2020-04-15 DIAGNOSIS — J069 Acute upper respiratory infection, unspecified: Secondary | ICD-10-CM

## 2020-04-15 DIAGNOSIS — R059 Cough, unspecified: Secondary | ICD-10-CM

## 2020-04-15 DIAGNOSIS — Z794 Long term (current) use of insulin: Secondary | ICD-10-CM

## 2020-04-15 MED ORDER — DEXTROMETHORPHAN-GUAIFENESIN 10-100 MG/5ML PO SYRP: 5.0000 mL | ORAL_SOLUTION | Freq: Two times a day (BID) | ORAL | 0 refills | Status: DC

## 2020-04-15 MED ORDER — CYCLOBENZAPRINE HCL 5 MG PO TABS: 5.0000 mg | ORAL_TABLET | Freq: Three times a day (TID) | ORAL | 0 refills | Status: DC | PRN

## 2020-04-15 MED FILL — CYCLOBENZAPRINE HCL 5 MG TA: 5 | 7 days supply | Qty: 21 | Fill #0

## 2020-04-15 NOTE — Assessment & Plan Note (Signed)
Patient is currently on NovoLog 6 units 3 times daily and insulin glargine 20 units daily. She reports that she has not been taking the glargine, she reports that she will be restarting this. She reports improvement in her depression which is what was preventing her from taking medications in the pastHer last A1c was 9.5 on 3/17.  Did not repeat A1c today due to covid testing, will repeat on follow up visit. Likely will be elevated due to her not taking the long acting medication.   -Continue Novolog 6 units TID WC -Advised to take the lantus 20 units daily -Advised to check her blood sugars and bring in meter on follow up

## 2020-04-15 NOTE — Assessment & Plan Note (Signed)
Patient is currently on metoprolol 25 mg twice daily.  Blood pressure today is 135/62. No changes at this time.

## 2020-04-15 NOTE — Assessment & Plan Note (Addendum)
Patient reports that her symptoms started on Sunday in the morning, she woke up with a sore throat, productive cough with clear sputum, runny nose, stuffy nose, chest ache with cough, back pain, neck and throat pain she took Tylenol on Sunday night which helped her symptoms.  On Monday her symptoms persisted however she then started having subjective fevers and hot flashes.  On Tuesday her symptoms have improved but are still present.  She also endorses a decreased appetite, generalized fatigue, and headache.  She has received both the Covid and flu vaccine.  Denies any recent sick contacts.  Lives alone and works at home.  No recent travel.  On exam she is mildly ill-appearing, no acute distress vitals have all been stable, lungs are CTA BL, tender lymphadenopathy left, no pharyngeal exudates noted.  Overall symptoms seem to be more consistent with a viral URI or flu symptoms.  However given the current pandemic will obtain Covid swab to evaluate.  Discussed with patient that this seems less likely however advised to quarantine until results come back.  We will give her cough syrup  -Robitussin as needed -Flexeril as needed -Covid swab, will contact with results -Advised to quarantine until results come back -Advised to hydrate, rest, and eat healthy

## 2020-04-15 NOTE — Patient Instructions (Addendum)
[name],  It was a pleasure to see you today. Thank you for coming in.   Today we discussed your cold symptoms. This seems like it's a viral illness. You can start taking flexeril for the muscle pains that you are having. Please stay well hydrated and try to eat healthy. You can start using some over the counter cough medications for your cough and I sent in a prescription for a cough syrup. We are checking you for COVID, please self quarantine until we contact you with the results.   We also discussed your diabetes. Please start taking the Lantus and continue taking the Novolog. Please try to start checking your blood sugars at least 1-2 times per day. We are checking your A1c today and will contact you with the results.    Please return to clinic in 1 month or sooner if needed.   Thank you again for coming in.   Asencion Noble.D.

## 2020-04-15 NOTE — Assessment & Plan Note (Signed)
Patient is currently taking sertraline and bupropion daily for her depression.  She reports that this seems to be working well for her. She reports that she is now willing to start taking most of her medications. No changes at this time.

## 2020-04-15 NOTE — Progress Notes (Signed)
   CC: Cold symptoms and diabetes  HPI: [name] is a 44 y.o. with history of CAD status post stent, PAD, hypertension, ESRD on HD TTS, presenting for follow-up of her hypertension, diabetes, depression, and cold symptoms.   Past Medical History:  Diagnosis Date  . Asthma    prn inhaler  . CAD S/P BMS PCI to prox LAD 03/31/2016   Ost LAD to Mid LAD lesion, 90% stenosed. Post intervention - Vision BMS 3.0 mm x 18 mm (~3.5 mm) there is a 0% residual stenosis.   . Charcot foot due to diabetes mellitus ([place]) 11/2016   left 2018; right 2019  . Depression   . Diabetic neuropathy (HCC)    feet  . ESRD (end stage renal disease) on dialysis ([place]) 12/2017   ?chronic interstitial nephritis  . H/O non-ST elevation myocardial infarction (NSTEMI) 03/2016   Found in 90% mid LAD lesion treated with bare-metal stent (BMS) PCI - vision BMS 3.0 mm x 18 mm  . History of MRSA infection   . Hyperlipidemia   . Hypertension    medication dose increased 11/22/2015; has been taking med. consistently since 03/2016, per pt.  . Insulin dependent diabetes mellitus   . Iron deficiency anemia    takes iron supplement  . Retinopathy of both eyes   . Sickle cell trait ([place])   . 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  Denies headaches, nausea, vomiting, abdominal pain, or shortness of breath.  Physical Exam:  Vitals:   04/15/20 0856  BP: 135/62  Pulse: 78  Temp: 98.6 F (37 C)  TempSrc: Oral  SpO2: 100%  Weight: 225 lb 3.2 oz (102.2 kg)  Height: 5\' 5"  (1.651 m)   Physical Exam  Constitutional: She is oriented to person, place, and time.  Mildly ill appearing, NAD, sitting in chair  HENT:  Head: Normocephalic and atraumatic.  Mouth/Throat: No oropharyngeal exudate.  Eyes: Pupils are equal, round, and reactive to light.  Conjunctivae and EOM are normal.  Cardiovascular: Normal rate, regular rhythm and normal heart sounds.  Pulmonary/Chest: Breath sounds normal. No respiratory distress.  Abdominal: Soft. Bowel sounds are normal.  Musculoskeletal:        General: No edema. Normal range of motion.     Cervical back: Normal range of motion and neck supple.  Lymphadenopathy:    She has cervical adenopathy (left).  Neurological: She is alert and oriented to person, place, and time.  Skin: Skin is warm and dry.  Psychiatric: Mood and affect normal.   Assessment & Plan:   See Encounters Tab for problem based charting.  Patient discussed with Dr. Heber Lynch

## 2020-04-16 LAB — NOVEL CORONAVIRUS, NAA: SARS-CoV-2, NAA: NOT DETECTED

## 2020-04-16 LAB — SARS-COV-2, NAA 2 DAY TAT

## 2020-04-16 NOTE — Progress Notes (Signed)
Patient called.  Patient aware. COVID test was negative. She reports that she has already started feeling better.

## 2020-04-17 ENCOUNTER — Telehealth: Payer: Self-pay | Admitting: Lactation Services

## 2020-04-17 NOTE — Telephone Encounter (Signed)
Called patient, was not able to reach her. LM for her to call the office for results and recommendations.   My Chart message sent.

## 2020-04-17 NOTE — Telephone Encounter (Signed)
-----   Message from Merilyn Baba, DO sent at 04/16/2020 10:12 AM EDT ----- Endometrial stripe measures 15 mm. She will need an EMB at her upcoming visit with MD attending.

## 2020-04-20 NOTE — Progress Notes (Signed)
Internal Medicine Clinic Attending  Case discussed with Dr. Krienke at the time of the visit.  We reviewed the resident's history and exam and pertinent patient test results.  I agree with the assessment, diagnosis, and plan of care documented in the resident's note.    

## 2020-04-23 ENCOUNTER — Ambulatory Visit: Payer: Medicare Other | Admitting: Obstetrics and Gynecology

## 2020-04-30 NOTE — Addendum Note (Signed)
Addended by: Hulan Fray on: 04/30/2020 04:29 PM   Modules accepted: Orders

## 2020-05-05 ENCOUNTER — Other Ambulatory Visit (HOSPITAL_COMMUNITY): Payer: Self-pay | Admitting: Nephrology

## 2020-05-05 ENCOUNTER — Ambulatory Visit: Payer: Medicare Other | Admitting: *Deleted

## 2020-05-05 DIAGNOSIS — Z992 Dependence on renal dialysis: Secondary | ICD-10-CM

## 2020-05-05 DIAGNOSIS — N186 End stage renal disease: Secondary | ICD-10-CM

## 2020-05-05 DIAGNOSIS — I10 Essential (primary) hypertension: Secondary | ICD-10-CM

## 2020-05-05 DIAGNOSIS — E113493 Type 2 diabetes mellitus with severe nonproliferative diabetic retinopathy without macular edema, bilateral: Secondary | ICD-10-CM

## 2020-05-05 MED FILL — GABAPENTIN 100 MG CAPSULE: 100 | 30 days supply | Qty: 30 | Fill #0

## 2020-05-05 NOTE — Progress Notes (Signed)
Internal Medicine Clinic Resident  I have personally reviewed this encounter including the documentation in this note and/or discussed this patient with the care management provider. I will address any urgent items identified by the care management provider and will communicate my actions to the patient's PCP. I have reviewed the patient's CCM visit with my supervising attending, Dr Guilloud.  Demonie Kassa, MD 05/05/2020    

## 2020-05-05 NOTE — Patient Instructions (Signed)
Visit Information It was nice speaking with you today. Please contact me if I can be of help.   Goals Addressed              This Visit's Progress     Patient Stated   .  "I need to get my diabetes under better control but my motivation comes and goes" (pt-stated)        [place] (see longitudinal plan of care for additional care plan information)  Current Barriers:  . Chronic Disease Management support, education, and care coordination needs related to CAD, HTN, HLD, DMII, and ESRD- patient states she is working from  home and does not have time to talk, says she is feeling better from the cold/virus she had earlier this month that prompted an acute clinic visit on 6/2 (tested negative for Covid) says she has no needs this CCM RN can assist with at present but does requests ongoing follow up calls  Clinical Goal(s) related to CAD, HTN, HLD, DMII, and ESRD:  Over the next 30-60 days, patient will:  . Work with the care management team to address educational, disease management, and care coordination needs  . Begin or continue self health monitoring activities as directed today  - consider trying the Dexcom CGM . Call provider office for new or worsened signs and symptoms New or worsened symptom related to DM, HTN, CAD, ESRD . Call care management team with questions or concerns . Verbalize basic understanding of patient centered plan of care established today  Interventions related to CAD, HTN, HLD, DMII, and ESRD:  . Acknowledged that she has a long standing relationship with Debera Lat for diabetes self management assistance and reminded patient this CCM RN can provide additional support and education  and issues specific to case management needs . Acknowledged patient's current gynecological issues and home stressors; offered assistance and provided emotional support . Encouraged patient to contact this CCM RN for any questions or concerns that may arise before next month's  call  Patient Self Care Activities related to CAD, HTN, HLD, DMII, and ESRD:  . Patient is unable to independently self-manage chronic health conditions  Please see past updates related to this goal by clicking on the "Past Updates" button in the selected goal         The patient verbalized understanding of instructions provided today and declined a print copy of patient instruction materials.   The care management team will reach out to the patient again over the next 30-60 days.   Kelli Churn RN, CCM, [facility] RN Care Manager [phone]

## 2020-05-05 NOTE — Chronic Care Management (AMB) (Signed)
Chronic Care Management   Follow Up Note   05/05/2020 Name: Jane [name] MRN: [id number] DOB: 03-20-1976  Referred by: [name], MD Reason for referral : Chronic Care Management (IDDM, ESRD, HTN, CAD,HLD)   Jane [name] is a 44 y.o. year old female who is a primary care patient of [name], MD. The CCM team was consulted for assistance with chronic disease management and care coordination needs.    Review of patient status, including review of consultants reports, relevant laboratory and other test results, and collaboration with appropriate care team members and the patient's provider was performed as part of comprehensive patient evaluation and provision of chronic care management services.    SDOH (Social Determinants of Health) assessments performed: No See Care Plan activities for detailed interventions related to [facility])     Outpatient Encounter Medications as of 05/05/2020  Medication Sig  . ACCU-CHEK FASTCLIX LANCETS MISC Check blood sugar 4x/day  . acetaminophen (TYLENOL) 500 MG tablet Take 1,000 mg by mouth every 6 (six) hours as needed (for pain.).  Marland Kitchen albuterol (PROVENTIL HFA) 108 (90 Base) MCG/ACT inhaler INHALE 2 PUFFS BY MOUTH EVERY 4 HOURS AS NEEDED FOR COUGHING, WHEEZING, OR SHORTNESS OF BREATH  . aspirin EC 81 MG tablet Take 1 tablet (81 mg total) by mouth daily.  . Blood Glucose Monitoring Suppl (ACCU-CHEK GUIDE) w/Device KIT 1 each by Does not apply route 3 (three) times daily.  Marland Kitchen buPROPion (WELLBUTRIN SR) 100 MG 12 hr tablet Take 1 tablet (100 mg total) by mouth 2 (two) times daily.  . cefdinir (OMNICEF) 300 MG capsule cefdinir 300 mg capsule  . cyclobenzaprine (FLEXERIL) 5 MG tablet Take 1 tablet (5 mg total) by mouth 3 (three) times daily as needed for muscle spasms.  . Dextromethorphan-guaiFENesin (ROBITUSSIN PEAK COLD DM) 10-100 MG/5ML liquid Take 5 mLs by mouth every 12 (twelve) hours.  Marland Kitchen glucose blood (ACCU-CHEK GUIDE) test strip Check  blood sugar up to 5 times a day  . icosapent Ethyl (VASCEPA) 1 g capsule Take 2 capsules (2 g total) by mouth 2 (two) times daily.  . insulin aspart (NOVOLOG) 100 UNIT/ML FlexPen Use 4-9U three times daily based on your carb counts and correction scale for a maximum of 24U daily  . insulin glargine (LANTUS) 100 UNIT/ML injection Inject 0.2 mLs (20 Units total) into the skin at bedtime.  . Insulin Pen Needle 32G X 4 MM MISC Use to inject insulin 3 times a day  . Insulin Syringe-Needle U-100 31G X 15/64" 0.3 ML MISC Use to inject insulin daily  . megestrol (MEGACE) 40 MG tablet Take 1 tablet (40 mg total) by mouth daily.  . metoprolol tartrate (LOPRESSOR) 25 MG tablet Take 1 tablet (25 mg total) by mouth 2 (two) times daily. Do not take the night before dialysis, and take 2 hours after dialysis.  Marland Kitchen multivitamin (RENA-VIT) TABS tablet Take 1 tablet by mouth daily.  Marland Kitchen RENAGEL 800 MG tablet Take 4,800 mg by mouth See admin instructions. Take 4800 mg tablets three times daily with meals  . rosuvastatin (CRESTOR) 40 MG tablet Take 1 tablet (40 mg total) by mouth daily.  . sertraline (ZOLOFT) 50 MG tablet Take 2 tablets (100 mg total) by mouth daily.   No facility-administered encounter medications on file as of 05/05/2020.     Objective:  Lab Results  Component Value Date   HGBA1C 9.5 (A) 01/29/2020   HGBA1C 8.2 (A) 11/29/2018   HGBA1C 9.1 (A) 08/01/2018   Wt Readings  from Last 3 Encounters:  04/15/20 225 lb 3.2 oz (102.2 kg)  04/07/20 219 lb 12.8 oz (99.7 kg)  04/01/20 226 lb 1.6 oz (102.6 kg)   BP Readings from Last 3 Encounters:  04/15/20 135/62  04/07/20 122/79  04/01/20 (!) 148/86     Goals Addressed              This Visit's Progress     Patient Stated   .  "I need to get my diabetes under better control but my motivation comes and goes." (pt-stated)        [place] (see longitudinal plan of care for additional care plan information)  Current Barriers:  . Chronic  Disease Management support, education, and care coordination needs related to CAD, HTN, HLD, DMII, and ESRD- patient states she is working from  home and does not have time to talk, says she is feeling better from the cold/virus she had earlier this month that prompted an acute clinic visit on 6/2 (tested negative for Covid) says she has no needs this CCM RN can assist with at present but does requests ongoing follow up calls  Clinical Goal(s) related to CAD, HTN, HLD, DMII, and ESRD:  Over the next 30-60 days, patient will:  . Work with the care management team to address educational, disease management, and care coordination needs  . Begin or continue self health monitoring activities as directed today  - consider trying the Dexcom CGM . Call provider office for new or worsened signs and symptoms New or worsened symptom related to DM, HTN, CAD, ESRD . Call care management team with questions or concerns . Verbalize basic understanding of patient centered plan of care established today  Interventions related to CAD, HTN, HLD, DMII, and ESRD:  . Acknowledged that she has a long standing relationship with Debera Lat for diabetes self management assistance and reminded patient this CCM RN can provide additional support and education  and issues specific to case management needs . Acknowledged patient's current gynecological issues and home stressors; offered assistance and provided emotional support. Noted she is scheduled for endometrial biopsy 05/20/20. . Encouraged patient to contact this CCM RN for any questions or concerns that may arise before next month's call  Patient Self Care Activities related to CAD, HTN, HLD, DMII, and ESRD:  . Patient is unable to independently self-manage chronic health conditions  Please see past updates related to this goal by clicking on the "Past Updates" button in the selected goal          Plan:   The care management team will reach out to the patient  again over the next 30-60 days.    Kelli Churn RN, CCM, [facility] RN Care Manager [phone]

## 2020-05-08 NOTE — Progress Notes (Signed)
Internal Medicine Clinic Attending  CCM services provided by the care management provider and their documentation were discussed with Dr. Basaraba. We reviewed the pertinent findings, urgent action items addressed by the resident and non-urgent items to be addressed by the PCP.  I agree with the assessment, diagnosis, and plan of care documented in the CCM and resident's note.  Editha Bridgeforth, MD 05/08/2020 

## 2020-05-14 ENCOUNTER — Ambulatory Visit: Payer: Medicare Other | Admitting: Cardiology

## 2020-05-19 MED FILL — AMOXICILLIN 500 MG CAPSULE: 500 | 7 days supply | Qty: 7 | Fill #0

## 2020-05-20 ENCOUNTER — Ambulatory Visit (INDEPENDENT_AMBULATORY_CARE_PROVIDER_SITE_OTHER): Payer: Medicare Other | Admitting: Obstetrics & Gynecology

## 2020-05-20 ENCOUNTER — Other Ambulatory Visit (HOSPITAL_COMMUNITY)
Admission: RE | Admit: 2020-05-20 | Discharge: 2020-05-20 | Disposition: A | Discharge status: Home / Self Care | Payer: Medicare Other | Source: Ambulatory Visit | Attending: Obstetrics & Gynecology | Admitting: Obstetrics & Gynecology

## 2020-05-20 ENCOUNTER — Encounter: Payer: Self-pay | Admitting: Obstetrics & Gynecology

## 2020-05-20 ENCOUNTER — Other Ambulatory Visit: Payer: Self-pay | Admitting: Obstetrics & Gynecology

## 2020-05-20 ENCOUNTER — Other Ambulatory Visit: Payer: Self-pay

## 2020-05-20 VITALS — BP 132/82 | HR 92 | Wt 221.1 lb

## 2020-05-20 DIAGNOSIS — N939 Abnormal uterine and vaginal bleeding, unspecified: Secondary | ICD-10-CM | POA: Insufficient documentation

## 2020-05-20 DIAGNOSIS — F321 Major depressive disorder, single episode, moderate: Secondary | ICD-10-CM

## 2020-05-20 DIAGNOSIS — Z1151 Encounter for screening for human papillomavirus (HPV): Secondary | ICD-10-CM | POA: Diagnosis not present

## 2020-05-20 DIAGNOSIS — Z3202 Encounter for pregnancy test, result negative: Secondary | ICD-10-CM

## 2020-05-20 LAB — POCT PREGNANCY, URINE: Preg Test, Ur: NEGATIVE

## 2020-05-20 MED ORDER — MEGESTROL ACETATE 40 MG PO TABS: 80.0000 mg | ORAL_TABLET | Freq: Two times a day (BID) | ORAL | 8 refills | Status: DC

## 2020-05-20 MED FILL — MEGESTROL 40 MG TABLET: 40 | 30 days supply | Qty: 120 | Fill #0

## 2020-05-20 NOTE — Patient Instructions (Addendum)
[facility] [address] [place], [place] [address] [phone]   or  [email or web address]://james-[email or web address] **SNAP/EBT/ Other nutritional benefits  [name] [address], [place], [place] [address] [phone]  or  https://palmer-smith.com/ **WIC for  women who are pregnant and postpartum, infants and children up to 45 years old  [place] [address], [place], [place] [address] [phone]   or   www.theblessedtable.org  **Food pantry  [facility] [address] [place], [place], [place] [address] [phone]   or   https://brotherkolbes.godaddysites.com  **Emergency food and prepared meals  [place] [address], [place], [place] [address] [phone]   or   www.cedargrovetop.us **Food pantry  [facility] [address] [employer], [place] [address] [phone]   or   www.https://hartman-jones.net/ **Food pantry  [employer] [facility] [address], [place], [place] [address] [phone] **Food pantry  [facility] [address], [place], [place] [address] [phone]   or   www.greensborourbanministry.org  Insurance underwriter and prepared meals  [facility] [address] [place], [place], [place], [place] [address] DomainerFinder.[email or web address]  **Food pantry  [employer] [facility] [address] [place], [place] [address] [phone]   or   www.lbcnow.org  **Food pantry  [facility] [address] [place], [name] [address] [phone]   or   http://patterson-parker.net/ **Food pantry, nutrition education, gardening activities  [place] [address] [place], [place] [address] [phone] **Food pantry  [facility] [address] [place], [place] [address] [phone]   or   www.salvationarmyof[place].[facility] [facility] [place], [place], [place] [address] [phone]   or   http://senior-resources-guilford.org [facility] [address] [place], [place] [address] [phone]   or   www.stmattchurch.com  **Food pantry  [facility] [address], [place], [place] [address] [phone]   or   vandaliapresbyterianchurch.org **Food pantry   Endometrial Biopsy, Care After This sheet gives you information about how to care for yourself after your procedure. Your health care provider may also give you more specific instructions. If you have problems or questions, contact your health care provider. What can I expect after the procedure? After the procedure, it is common to have:  Mild cramping.  A small amount of vaginal bleeding for a few days. This is normal. Follow these instructions at home:   Take over-the-counter and prescription medicines only as told by your health care provider.  Do not douche, use tampons, or have sexual intercourse until your health care provider approves.  Return to your normal activities as told by your health care provider. Ask your health care provider what activities are safe for you.  Follow instructions from your health care provider about any activity restrictions, such as restrictions on strenuous exercise or heavy lifting. Contact a health care provider if:  You have heavy bleeding, or bleed for longer than 2 days after the procedure.  You have bad smelling discharge from your vagina.  You have a fever or chills.  You have a burning sensation when urinating or you have difficulty urinating.  You have severe pain in your  lower abdomen. Get help right away if:  You have severe cramps in your stomach or back.  You pass  large blood clots.  Your bleeding increases.  You become weak or light-headed, or you pass out. Summary  After the procedure, it is common to have mild cramping and a small amount of vaginal bleeding for a few days.  Do not douche, use tampons, or have sexual intercourse until your health care provider approves.  Return to your normal activities as told by your health care provider. Ask your health care provider what activities are safe for you. This information is not intended to replace advice given to you by your health care provider. Make sure you discuss any questions you have with your health care provider. Document Revised: 10/13/2017 Document Reviewed: 11/16/2016 Elsevier Patient Education  [place].

## 2020-05-20 NOTE — Progress Notes (Signed)
PHQ-9 and GAD-7 positive; no SI. Pt reports improvement with Wellbutrin, states she has more energy and has been able to do more. Followed by PCP for medication management and participates in virtual counseling with [place], [place]. Pt is satisfied with current plan of care.   Apolonio Schneiders RN 05/20/20

## 2020-05-20 NOTE — Progress Notes (Signed)
GYNECOLOGY OFFICE VISIT NOTE  History:   Jane [name] is a 44 y.o. [phone] here today for endometrial biopsy and discussion about management of AUB.  Was managed on Megace in past, dose is currently 40 mg po qd.  Feels this is not adewquate and she bleeds and has clots with this. Desires surgical management with endometrial ablation or hysterectomy.  Of note, patient has many co-morbidities (CAD, ESRD on HD, poorly controlled DM with complications). She reports her renal physician recommended she have the surgery.  She denies any current abnormal vaginal discharge, pelvic pain or other concerns.    Past Medical History:  Diagnosis Date  . Asthma    prn inhaler  . CAD S/P BMS PCI to prox LAD 03/31/2016   Ost LAD to Mid LAD lesion, 90% stenosed. Post intervention - Vision BMS 3.0 mm x 18 mm (~3.5 mm) there is a 0% residual stenosis.   . Charcot foot due to diabetes mellitus ([place]) 11/2016   left 2018; right 2019  . Depression   . Diabetic neuropathy (HCC)    feet  . ESRD (end stage renal disease) on dialysis ([place]) 12/2017   ?chronic interstitial nephritis  . H/O non-ST elevation myocardial infarction (NSTEMI) 03/2016   Found in 90% mid LAD lesion treated with bare-metal stent (BMS) PCI - vision BMS 3.0 mm x 18 mm  . History of MRSA infection   . Hyperlipidemia   . Hypertension    medication dose increased 11/22/2015; has been taking med. consistently since 03/2016, per pt.  . Insulin dependent diabetes mellitus   . Iron deficiency anemia    takes iron supplement  . Retinopathy of both eyes   . Sickle cell trait ([place])   . 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 ACHILLES TENDON LENGTHENING Left 11/24/2016   Procedure: Left Achilles tendon lengthening (open);  Surgeon: Wylene Simmer, MD;  Location: [facility];  Service: Orthopedics;  Laterality: Left;  . AV FISTULA PLACEMENT Right 07/31/2017   Procedure:  ARTERIOVENOUS BRACHIOCEPHALIC FISTULA CREATION RIGHT ARM;  Surgeon: Conrad [place], MD;  Location: [facility];  Service: Vascular;  Laterality: Right;  . CALCANEAL OSTEOTOMY Left 11/24/2016   Procedure: Left hindfoot osteotomy and fusion;  Surgeon: Wylene Simmer, MD;  Location: [facility];  Service: Orthopedics;  Laterality: Left;  . CARDIAC CATHETERIZATION N/A 03/31/2016   Procedure: Left Heart Cath and Coronary Angiography;  Surgeon: Lorretta Harp, MD;  Location: [facility] INVASIVE CV LAB: 90% early mLAD, normal LV Fxn  . CARDIAC CATHETERIZATION N/A 03/31/2016   Procedure: Coronary Stent Intervention;  Surgeon: Lorretta Harp, MD;  Location: [facility] CV LAB;  Service: Cardiovascular: PCI to mLAD BMS Vision 3.0 mm x 18 mm  . CESAREAN SECTION  1997  . CORONARY ANGIOPLASTY    . FISTULA SUPERFICIALIZATION Right 11/15/2017   Procedure: FISTULA SUPERFICIALIZATION RIGHT BRACHIOCEPHALIC  RIGHT;  Surgeon: Conrad , MD;  Location: [facility];  Service: Vascular;  Laterality: Right;  . NM MYOVIEW LTD  08/2018   LOW RISK. EF 55-65%. No ischemia or Infarction  . TRANSTHORACIC ECHOCARDIOGRAM  03/2016   Normal LV size and thickness. EF 60-65%. GR 1 DD. Otherwise essentially normal.  . TUBAL LIGATION  1997    The following portions of the patient's history were reviewed and updated as appropriate: allergies, current medications, past family history, past medical history, past social history, past surgical history and problem  list.   Health Maintenance:  Normal pap and negative HRHPV on 08/29/2017.  Normal mammogram on 11/26/2018.   Review of Systems:  Pertinent items noted in HPI and remainder of comprehensive ROS otherwise negative.  Physical Exam:  BP 132/82   Pulse 92   Wt 221 lb 1.6 oz (100.3 kg)   BMI 36.79 kg/m  CONSTITUTIONAL: Well-developed, well-nourished female in no acute distress.  HEENT:  Normocephalic, atraumatic. External right and left ear normal. No scleral icterus.  NECK:  Normal range of motion, supple, no masses noted on observation SKIN: No rash noted. Not diaphoretic. No erythema. No pallor. MUSCULOSKELETAL: Normal range of motion. No edema noted. NEUROLOGIC: Alert and oriented to person, place, and time. Normal muscle tone coordination. No cranial nerve deficit noted. PSYCHIATRIC: Normal mood and affect. Normal behavior. Normal judgment and thought content. CARDIOVASCULAR: Normal heart rate noted RESPIRATORY: Effort and breath sounds normal, no problems with respiration noted ABDOMEN: No masses noted. No other overt distention noted.   PELVIC: Normal appearing external genitalia; normal urethral meatus; normal appearing vaginal mucosa and cervix.  Bloody discharge noted.  Pap smear done. Normal uterine size, no other palpable masses, no uterine or adnexal tenderness. Performed in the presence of a chaperone  ENDOMETRIAL BIOPSY     The indications for endometrial biopsy were reviewed.   Risks of the biopsy including cramping, bleeding, infection, uterine perforation, inadequate specimen and need for additional procedures  were discussed. The patient states she understands and agrees to undergo procedure today. Consent was signed. Time out was performed. Urine HCG was negative. During the pelvic exam, the cervix was prepped with Betadine. A single-toothed tenaculum was placed on the anterior lip of the cervix to stabilize it. The 3 mm pipelle was introduced into the endometrial cavity without difficulty to a depth of 9 cm, and a small amount of tissue was obtained even after three passes and sent to pathology. The instruments were removed from the patient's vagina. Minimal bleeding from the cervix was noted. The patient tolerated the procedure well. Routine post-procedure instructions were given to the patient.    Labs and Imaging Results for orders placed or performed in visit on 05/20/20 (from the past 168 hour(s))  Pregnancy, urine POC   Collection Time:  05/20/20  9:20 AM  Result Value Ref Range   Preg Test, Ur NEGATIVE NEGATIVE   US Transvaginal Non-OB  Result Date: 04/15/2020 CLINICAL DATA:  Initial evaluation for abnormal uterine bleeding. EXAM: ULTRASOUND PELVIS TRANSVAGINAL TECHNIQUE: Transvaginal ultrasound examination of the pelvis was performed including evaluation of the uterus, ovaries, adnexal regions, and pelvic cul-de-sac. COMPARISON:  Prior ultrasound from 11/29/2018. FINDINGS: Uterus Measurements: 8.9 x 5.2 x 4.8 cm = volume: 115 mL. Uterus demonstrates a heterogeneous echotexture without discrete fibroid or other mass. Endometrium Thickness: 15 mm. Endometrial complex demonstrates a mildly heterogeneous echotexture without focal abnormality. Small amount of mildly complex fluid noted within the endometrial canal at the level of the lower uterine segment/cervix. Right ovary Measurements: 2.8 x 1.3 x 2.1 cm = volume: 3.8 mL. Normal appearance/no adnexal mass. Left ovary Measurements: 3.7 x 1.6 x 2.1 cm = volume: 6.6 mL. Normal appearance/no adnexal mass. Other findings:  Trace free fluid noted within the pelvis. IMPRESSION: 1. Endometrial stripe measures up to 15 mm in thickness. If bleeding remains unresponsive to hormonal or medical therapy, sonohysterogram should be considered for focal lesion work-up. (Ref: Radiological Reasoning: Algorithmic Workup of Abnormal Vaginal Bleeding with Endovaginal Sonography and Sonohysterography. AJR 2008; 546:E70-35). 2. Small  amount of mildly complex fluid within the endometrial cavity, likely blood products given history of abnormal uterine bleeding. 3. Otherwise negative pelvic ultrasound. Electronically Signed   By: Jeannine Boga M.D.   On: 04/15/2020 20:00       Assessment and Plan:    1. Abnormal uterine bleeding (AUB) Had a very long discussion with patient about how she is not a good surgical candidate. She was initially insistent that she only wants surgery, and wanted to discuss with Dr.  Ihor Dow who she has seen in the past. Explained increased surgical and anesthesia risk given her CAD, ESRD, poorly controlled DM etc.  Recommended increased dosage of Megace vs progestin IUD.  She was initially still insistent on surgery.  She was told that would require a lot of coordination with her Renal doctors, cardiologists, anesthesia teams etc and will only be done for medically indicated procedures, not elective procedures. Also recommended she get a second opinion at a tertiary care institution, sometimes they are able to coordinate services easily.  I told her I was not comfortable operating on her.  She wants to consult with Dr. Ihor Dow, but is considering the progestin IUD.  She agreed to Megace increased dosage for now.  Will follow up results of pap and endometrial biopsy and manage accordingly.  - megestrol (MEGACE) 40 MG tablet; Take 2 tablets (80 mg total) by mouth 2 (two) times daily.  Dispense: 120 tablet; Refill: 8 - Cytology - PAP - Surgical pathology  Routine preventative health maintenance measures emphasized. Please refer to After Visit Summary for other counseling recommendations.   Return for Followup with Dr. Ihor Dow (message sent to [facility]).    Total face-to-face time with patient: 30 minutes.  Over 50% of encounter was spent on counseling and coordination of care.   Verita Schneiders, MD, [place] for [employer], [place]

## 2020-05-21 LAB — CYTOLOGY - PAP
Chlamydia: NEGATIVE
Comment: NEGATIVE
Comment: NEGATIVE
Comment: NEGATIVE
Comment: NORMAL
Diagnosis: NEGATIVE
High risk HPV: NEGATIVE
Neisseria Gonorrhea: NEGATIVE
Trichomonas: NEGATIVE

## 2020-05-22 ENCOUNTER — Telehealth (INDEPENDENT_AMBULATORY_CARE_PROVIDER_SITE_OTHER): Payer: Medicare Other | Admitting: Lactation Services

## 2020-05-22 DIAGNOSIS — N939 Abnormal uterine and vaginal bleeding, unspecified: Secondary | ICD-10-CM

## 2020-05-22 LAB — SURGICAL PATHOLOGY

## 2020-05-22 NOTE — Telephone Encounter (Signed)
Called patient to inform her of results of the Endometrial Biopsy and need for repeat at next appt. Patient voiced understanding. Noted in next appt notes.

## 2020-05-22 NOTE — Telephone Encounter (Signed)
-----   Message from Osborne Oman, MD sent at 05/22/2020 11:12 AM EDT ----- Unfortunately, this sample was not adequate as no endometrial cells were seen. Benign endocervical cells were noted. Will need re-sampling, can be done during next appointment. Please call and inform patient of results and recommendations.

## 2020-06-04 ENCOUNTER — Telehealth: Payer: Medicare Other

## 2020-06-08 ENCOUNTER — Ambulatory Visit: Payer: Medicare Other | Admitting: *Deleted

## 2020-06-08 DIAGNOSIS — Z992 Dependence on renal dialysis: Secondary | ICD-10-CM

## 2020-06-08 DIAGNOSIS — I10 Essential (primary) hypertension: Secondary | ICD-10-CM

## 2020-06-08 DIAGNOSIS — E113493 Type 2 diabetes mellitus with severe nonproliferative diabetic retinopathy without macular edema, bilateral: Secondary | ICD-10-CM

## 2020-06-08 DIAGNOSIS — Z794 Long term (current) use of insulin: Secondary | ICD-10-CM

## 2020-06-08 DIAGNOSIS — N186 End stage renal disease: Secondary | ICD-10-CM

## 2020-06-08 NOTE — Chronic Care Management (AMB) (Signed)
  Chronic Care Management   Note  06/08/2020 Name: Jane [name] MRN: [id number] DOB: 06-Dec-1975  Successful outreach to patient to attempt chronic care management follow up assessment. Patient states " I have lots of things I want to talk to you about but this is not a good time." Patient requests retuun call tomorrow at 2:00pm.  Follow up plan: Telephone follow up appointment with care management team member scheduled for:06/09/20 1:00 pm  Kelli Churn RN, CCM, [facility] RN Care Manager [phone]

## 2020-06-09 ENCOUNTER — Ambulatory Visit: Payer: Medicare Other | Admitting: *Deleted

## 2020-06-09 ENCOUNTER — Telehealth: Payer: Self-pay | Admitting: *Deleted

## 2020-06-09 DIAGNOSIS — N186 End stage renal disease: Secondary | ICD-10-CM

## 2020-06-09 DIAGNOSIS — E113493 Type 2 diabetes mellitus with severe nonproliferative diabetic retinopathy without macular edema, bilateral: Secondary | ICD-10-CM

## 2020-06-09 DIAGNOSIS — Z992 Dependence on renal dialysis: Secondary | ICD-10-CM

## 2020-06-09 DIAGNOSIS — I10 Essential (primary) hypertension: Secondary | ICD-10-CM

## 2020-06-09 NOTE — Chronic Care Management (AMB) (Signed)
Chronic Care Management   Follow Up Note   06/09/2020 Name: Jane [name] MRN: [id number] DOB: 13-Nov-1976  Referred by: [name], MD Reason for referral : Chronic Care Management (ESRD, CAD, HLD, IDDM)   Jane [name] is a 44 y.o. year old female who is a primary care patient of [name], MD. The CCM team was consulted for assistance with chronic disease management and care coordination needs.    Review of patient status, including review of consultants reports, relevant laboratory and other test results, and collaboration with appropriate care team members and the patient's provider was performed as part of comprehensive patient evaluation and provision of chronic care management services.    SDOH (Social Determinants of Health) assessments performed: No See Care Plan activities for detailed interventions related to [facility])     Outpatient Encounter Medications as of 06/09/2020  Medication Sig Note  . ACCU-CHEK FASTCLIX LANCETS MISC Check blood sugar 4x/day (Patient not taking: Reported on 05/20/2020)   . acetaminophen (TYLENOL) 500 MG tablet Take 1,000 mg by mouth every 6 (six) hours as needed (for pain.).   Marland Kitchen albuterol (PROVENTIL HFA) 108 (90 Base) MCG/ACT inhaler INHALE 2 PUFFS BY MOUTH EVERY 4 HOURS AS NEEDED FOR COUGHING, WHEEZING, OR SHORTNESS OF BREATH   . aspirin EC 81 MG tablet Take 1 tablet (81 mg total) by mouth daily.   . Blood Glucose Monitoring Suppl (ACCU-CHEK GUIDE) w/Device KIT 1 each by Does not apply route 3 (three) times daily. (Patient not taking: Reported on 05/20/2020)   . buPROPion (WELLBUTRIN SR) 100 MG 12 hr tablet Take 1 tablet (100 mg total) by mouth 2 (two) times daily.   . cefdinir (OMNICEF) 300 MG capsule cefdinir 300 mg capsule   . cyclobenzaprine (FLEXERIL) 5 MG tablet Take 1 tablet (5 mg total) by mouth 3 (three) times daily as needed for muscle spasms.   Marland Kitchen glucose blood (ACCU-CHEK GUIDE) test strip Check blood sugar up to 5 times a  day (Patient not taking: Reported on 05/20/2020)   . icosapent Ethyl (VASCEPA) 1 g capsule Take 2 capsules (2 g total) by mouth 2 (two) times daily.   . insulin aspart (NOVOLOG) 100 UNIT/ML FlexPen Use 4-9U three times daily based on your carb counts and correction scale for a maximum of 24U daily 05/20/2020: Does not check BG; 6 u at each meal.  . insulin glargine (LANTUS) 100 UNIT/ML injection Inject 0.2 mLs (20 Units total) into the skin at bedtime. (Patient not taking: Reported on 05/20/2020)   . Insulin Pen Needle 32G X 4 MM MISC Use to inject insulin 3 times a day (Patient not taking: Reported on 05/20/2020)   . Insulin Syringe-Needle U-100 31G X 15/64" 0.3 ML MISC Use to inject insulin daily (Patient not taking: Reported on 05/20/2020)   . megestrol (MEGACE) 40 MG tablet Take 2 tablets (80 mg total) by mouth 2 (two) times daily.   . metoprolol tartrate (LOPRESSOR) 25 MG tablet Take 1 tablet (25 mg total) by mouth 2 (two) times daily. Do not take the night before dialysis, and take 2 hours after dialysis.   Marland Kitchen multivitamin (RENA-VIT) TABS tablet Take 1 tablet by mouth daily.   Marland Kitchen RENAGEL 800 MG tablet Take 4,800 mg by mouth See admin instructions. Take 4800 mg tablets three times daily with meals   . rosuvastatin (CRESTOR) 40 MG tablet Take 1 tablet (40 mg total) by mouth daily.   . sertraline (ZOLOFT) 100 MG tablet Take 100 mg by mouth  daily.    No facility-administered encounter medications on file as of 06/09/2020.     Objective:  Wt Readings from Last 3 Encounters:  05/20/20 221 lb 1.6 oz (100.3 kg)  04/15/20 225 lb 3.2 oz (102.2 kg)  04/07/20 219 lb 12.8 oz (99.7 kg)    Goals Addressed              This Visit's Progress     Patient Stated   .  "I need to get my diabetes under better control but my motivation comes and goes" (pt-stated)        [place] (see longitudinal plan of care for additional care plan information)  Current Barriers:  . Chronic Disease Management  support, education, and care coordination needs related to CAD, HTN, HLD, DMII, and ESRD- patient states she is pursuing laser surgery for abnormal uterine bleeding and will see her OB gyn on 06/22/20, she says she is now checking her blood sugar about once a day because she knows she needs to get better control of her diabetes, she is requesting written educational of information on foods low in phosphorous as she says her phosphorous levels remain high despite the medication she takes and she is also requesting more information on ESRD diet  Clinical Goal(s) related to CAD, HTN, HLD, DMII, and ESRD:  Over the next 30-60 days, patient will:  . Work with the care management team to address educational, disease management, and care coordination needs  . Begin or continue self health monitoring activities as directed today  - consider trying the Dexcom CGM . Call provider office for new or worsened signs and symptoms New or worsened symptom related to DM, HTN, CAD, ESRD . Call care management team with questions or concerns . Verbalize basic understanding of patient centered plan of care established today  Interventions related to CAD, HTN, HLD, DMII, and ESRD:  . Voiced appreciation for patient reaching out to this CCM RN to provide additional support and education  and issues specific to case management needs . Acknowledged patient's current gynecological issues and home stressors; offered assistance and provided emotional support . Discussed strategies to reduce consumption of sugar sweetened drinks . Per patient request will mail her information on ESRD diet and foods high in phosphorus, handouts on "[place] for Diabetics" . Also mailed patient a [employer] spiral bound calendar with business cards for this CCM and CCM Buyer, retail . Encouraged patient to contact this CCM RN for any questions or concerns that may arise before next month's call  Patient  Self Care Activities related to CAD, HTN, HLD, DMII, and ESRD:  . Patient is unable to independently self-manage chronic health conditions  Please see past updates related to this goal by clicking on the "Past Updates" button in the selected goal        Other   .  Blood Pressure < 140/90        BP Readings from Last 3 Encounters:  05/20/20 132/82  04/15/20 135/62  04/07/20 122/79     Meeting blood pressure targets    .  HEMOGLOBIN A1C < 7        Lab Results  Component Value Date   HGBA1C 9.5 (A) 01/29/2020   HGBA1C 8.2 (A) 11/29/2018   HGBA1C 9.1 (A) 08/01/2018   Lab Results  Component Value Date   MICROALBUR 40.6 (H) 08/05/2014   LDLCALC 100 (H) 10/15/2019   CREATININE 11.87 (H) 11/29/2018  Not meeting Hgb A1C goals- discussed strategies to improve diabetes management     .  LDL CALC < 100        Lab Results  Component Value Date   CHOL 186 10/15/2019   HDL 44 10/15/2019   LDLCALC 100 (H) 10/15/2019   TRIG 249 (H) 10/15/2019   CHOLHDL 4.2 10/15/2019           Plan:   The care management team will reach out to the patient again over the next 30-60 days.    Kelli Churn RN, CCM, [facility] RN Care Manager [phone]

## 2020-06-09 NOTE — Patient Instructions (Signed)
Visit Information It was very nice speaking with you today. Thanks for reaching out to me with your request for educational material. I will mail the information to you today. Goals Addressed              This Visit's Progress     Patient Stated   .  "I need to get my diabetes under better control but my motivation comes and goes" (pt-stated)        [place] (see longitudinal plan of care for additional care plan information)  Current Barriers:  . Chronic Disease Management support, education, and care coordination needs related to CAD, HTN, HLD, DMII, and ESRD- patient states she is pursuing laser surgery for abnormal uterine bleeding and will see her OB gyn on 06/22/20, she says she is now checking her blood sugar about once a day because she knows she needs to get better control of her diabetes, she is requesting written educational of information on foods low in phosphorous as she says her phosphorous levels remain high despite the medication she takes and she is also requesting more information on ESRD diet  Clinical Goal(s) related to CAD, HTN, HLD, DMII, and ESRD:  Over the next 30-60 days, patient will:  . Work with the care management team to address educational, disease management, and care coordination needs  . Begin or continue self health monitoring activities as directed today . Call provider office for new or worsened signs and symptoms New or worsened symptom related to DM, HTN, CAD, ESRD . Call care management team with questions or concerns . Verbalize basic understanding of patient centered plan of care established today  Interventions related to CAD, HTN, HLD, DMII, and ESRD:  . Voiced appreciation for patient reaching out to this CCM RN to provide additional support and education  and issues specific to case management needs . Acknowledged patient's current gynecological issues and home stressors; offered assistance and provided emotional support . Discussed  strategies to reduce consumption of sugar sweetened drinks . Per patient request will mail her information on ESRD diet and foods high in phosphorus, handouts on "[place] for Diabetics". Emmi educational handout on Dialysis Diet, list of foods high, medium and low  in phosphorous . Also mailed patient a [employer] spiral bound calendar with business cards for this CCM and CCM Buyer, retail . Encouraged patient to contact this CCM RN for any questions or concerns that may arise before next month's call  Patient Self Care Activities related to CAD, HTN, HLD, DMII, and ESRD:  . Patient is unable to independently self-manage chronic health conditions  Please see past updates related to this goal by clicking on the "Past Updates" button in the selected goal        Other   .  Blood Pressure < 140/90        BP Readings from Last 3 Encounters:  05/20/20 132/82  04/15/20 135/62  04/07/20 122/79     Meeting blood pressure targets    .  HEMOGLOBIN A1C < 7        Lab Results  Component Value Date   HGBA1C 9.5 (A) 01/29/2020   HGBA1C 8.2 (A) 11/29/2018   HGBA1C 9.1 (A) 08/01/2018   Lab Results  Component Value Date   MICROALBUR 40.6 (H) 08/05/2014   LDLCALC 100 (H) 10/15/2019   CREATININE 11.87 (H) 11/29/2018   Not meeting Hgb A1C goals- discussed strategies to improve diabetes management     .  LDL CALC < 100        Lab Results  Component Value Date   CHOL 186 10/15/2019   HDL 44 10/15/2019   LDLCALC 100 (H) 10/15/2019   TRIG 249 (H) 10/15/2019   CHOLHDL 4.2 10/15/2019          The patient verbalized understanding of instructions provided today and declined a print copy of patient instruction materials.   The care management team will reach out to the patient again over the next 30-60 days.   Kelli Churn RN, CCM, [facility] RN Care Manager [phone]

## 2020-06-09 NOTE — Telephone Encounter (Signed)
  Chronic Care Management   Outreach Note  06/09/2020 Name: Jane [name] MRN: [id number] DOB: [birth year]  Referred by: [name], MD Reason for referral : Chronic Care Management (ERSD, IDDM, HTN, HLD, CAD)   An unsuccessful telephone outreach was attempted today.Per patient request of yesterday,  phone appointment scheduled for today at 1:00 pm.  The patient was referred to the case management team for assistance with care management and care coordination.   Follow Up Plan: Unable to leave message as receding states voice mailbox is full. The care management team will reach out to the patient again over the next 7 days.   Kelli Churn RN, CCM, [facility] RN Care Manager [phone]

## 2020-06-22 ENCOUNTER — Ambulatory Visit: Payer: Medicare Other | Admitting: Obstetrics & Gynecology

## 2020-06-22 ENCOUNTER — Ambulatory Visit (INDEPENDENT_AMBULATORY_CARE_PROVIDER_SITE_OTHER): Payer: Medicare Other | Admitting: Cardiology

## 2020-06-22 ENCOUNTER — Other Ambulatory Visit: Payer: Self-pay

## 2020-06-22 ENCOUNTER — Encounter: Payer: Self-pay | Admitting: Cardiology

## 2020-06-22 VITALS — BP 158/88 | HR 100 | Ht 65.0 in | Wt 230.0 lb

## 2020-06-22 DIAGNOSIS — Z9861 Coronary angioplasty status: Secondary | ICD-10-CM

## 2020-06-22 DIAGNOSIS — I779 Disorder of arteries and arterioles, unspecified: Secondary | ICD-10-CM | POA: Diagnosis not present

## 2020-06-22 DIAGNOSIS — I2510 Atherosclerotic heart disease of native coronary artery without angina pectoris: Secondary | ICD-10-CM | POA: Diagnosis not present

## 2020-06-22 DIAGNOSIS — I10 Essential (primary) hypertension: Secondary | ICD-10-CM | POA: Diagnosis not present

## 2020-06-22 DIAGNOSIS — E1169 Type 2 diabetes mellitus with other specified complication: Secondary | ICD-10-CM

## 2020-06-22 DIAGNOSIS — N186 End stage renal disease: Secondary | ICD-10-CM

## 2020-06-22 DIAGNOSIS — E083413 Diabetes mellitus due to underlying condition with severe nonproliferative diabetic retinopathy with macular edema, bilateral: Secondary | ICD-10-CM

## 2020-06-22 DIAGNOSIS — E785 Hyperlipidemia, unspecified: Secondary | ICD-10-CM

## 2020-06-22 DIAGNOSIS — Z794 Long term (current) use of insulin: Secondary | ICD-10-CM

## 2020-06-22 DIAGNOSIS — Z992 Dependence on renal dialysis: Secondary | ICD-10-CM

## 2020-06-22 NOTE — Patient Instructions (Signed)
Medication Instructions:  No changes *If you need a refill on your cardiac medications before your next appointment, please call your pharmacy*   Lab Work:  lipid ,  Liver panel - 2 weeks  Fasting can have labs done at diaylsis If you have labs (blood work) drawn today and your tests are completely normal, you will receive your results only by: Marland Kitchen MyChart Message (if you have MyChart) OR . A paper copy in the mail If you have any lab test that is abnormal or we need to change your treatment, we will call you to review the results.  Other Instructions Please check and record blood pressure reading for the next 2 weeks  After dialysis  And  The evening before dialysis about 2 days, call give results   Testing/Procedures: Not needed   Follow-Up: At [facility], you and your health needs are our priority.  As part of our continuing mission to provide you with exceptional heart care, we have created designated Provider Care Teams.  These Care Teams include your primary Cardiologist (physician) and Advanced Practice Providers (APPs -  Physician Assistants and Nurse Practitioners) who all work together to provide you with the care you need, when you need it.    Your next appointment:   6 month(s)  The format for your next appointment:   In Person  Provider:   Glenetta Hew, MD

## 2020-06-22 NOTE — Progress Notes (Signed)
Primary Care Provider: [name], MD Cardiologist: Glenetta Hew, MD Electrophysiologist: None  Clinic Note: Chief Complaint  Patient presents with  . Follow-up    Delayed 5-month . Coronary Artery Disease    No angina    HPI:    Jane SPEECEis a 44y.o. female with a PMH notable for severe poorly controlled diabetes (with history of Charcot foot status post multiple surgeries), ESRD (previously had nephrotic range proteinuria) now on HD as well as single-vessel CAD with BMS PCI to the LAD in May 2017 (in the setting of non-STEMI- BMS chosen due to need for Charcot foot surgery) who presents today for delayed 660-monthollow-up for CAD-PCI with other risk factors..  --> Most recent Myoview was in October 2019: Low risk with no ischemia or infarction normal EF.  Jane BOUNDas last seen on October 15, 2019 --she seemed to be doing pretty well.  Stable cardiac standpoint.  Still some exertional dyspnea but very deconditioned.  Not able to walk very much still.  The left knee was bothering her in the right foot.  She had some issues with her living arrangements which seemed to cause her to be depressed.  She was not having any chest pain with dialysis, but was noting some dizziness after dialysis.  A little bit of fatigue but not significant.  She does get tired with dialysis.  No sense of palpitations or irregular heartbeats.  Was still somewhat emotionally depressed-indicating that she was "tired at baseline ", not sleeping well.  Tossing and turning -> was dealing with lots of social issues.  Recent Hospitalizations: None  Reviewed  CV studies:    The following studies were reviewed today: (if available, images/films reviewed: From Epic Chart or Care Everywhere) . none:   Interval History:   Jane BRADBYeturns here today actually in great spirits.  She seems quite happy, she is wearing sunglasses and smiling.  She was able to get her finances in  order and now has a vehicle so she is able to drive to her dialysis, as well as other appointments.  This also gives her freedom to go shopping.  She also has a stable living arrangement and therefore is in much better mood.  She missed a dialysis session last week because the car was in the shop and she was not able to get there.  She now is due for dialysis tomorrow and is definitely volume up.  She can feel it within swelling of a little bit of orthopnea, I feel that she probably needs to potentially do an extra day of dialysis this week.  When this happens her blood pressure gets up and it weight goes up.  She is not really having any significant PND orthopnea but does little to dyspnea.  No headache or blurred vision. She is reluctant to really do too much about her blood pressure because it will go down after dialysis.  She has not had any syncope or near syncope with dialysis but has had some lightheadedness.  She gets dizzy after dialysis with a full-blown, but she usually sits there for about 20 to 30 minutes and gets better.  Last time she will go a whole week if getting dizziness after dialysis and then will go for another week or 2 without it.  Probably the most significant thing that she mentions now is that her depression has waned.  She now has desire to get out and do things.  She  is now watching diet and trying to walk.  Her foot is feeling much better.  She is able to walk now without assistance.  No more booty.  She is not having any chest pain or pressure with rest or exertion.  She does get little short of breath when she is due for dialysis but otherwise not. She has had a little issue with allergies of late.  In May she is completely congested, but this is starting to resolve.  Her eyes got totally red she got really short of breath with congestion.  She was taking some over-the-counter medications that she realized she probably should not take, so she stopped.   CV Review of Symptoms  (Summary) Cardiovascular ROS: positive for - dyspnea on exertion, edema, orthopnea, shortness of breath and When she misses a dialysis day or is volume up based on her weights and missing dialysis. negative for - chest pain, irregular heartbeat, palpitations, paroxysmal nocturnal dyspnea, rapid heart rate, shortness of breath or Syncope/near syncope, TIA/amaurosis fugax, claudication (to the extent that she walks)  The patient does not have symptoms concerning for COVID-19 infection (fever, chills, cough, or new shortness of breath).  The patient is practicing social distancing & Masking.  Immunization History  Administered Date(s) Administered  . Influenza Whole 01/11/2009, 07/22/2011  . Influenza,inj,Quad PF,6+ Mos 08/05/2014, 12/24/2015, 08/04/2016, 08/29/2017, 08/01/2018  . Pneumococcal Conjugate-13 08/01/2018  . Pneumococcal Polysaccharide-23 01/11/2009, 08/15/2014  . Tdap 10/28/2013   COVID 19 (she thinks Ph[employer]) x 2  REVIEWED OF SYSTEMS   Review of Systems  Constitutional: Positive for malaise/fatigue (Definitely improving as she is sleeping better.). Negative for weight loss (Weight currently up because she needs dialysis).  HENT: Positive for congestion (Intermittently with allergies). Negative for nosebleeds.   Respiratory: Negative for shortness of breath.   Gastrointestinal: Negative for blood in stool and melena.  Genitourinary: Negative for flank pain and hematuria.  Musculoskeletal: Positive for joint pain (Right foot still hurts, but much better.  Left knee hurts.  This is what limits her walking.). Negative for myalgias.  Neurological: Positive for dizziness (After dialysis sometimes). Negative for focal weakness and weakness.  Endo/Heme/Allergies: Positive for environmental allergies.  Psychiatric/Behavioral: Negative for depression (She says her depression is going) and memory loss. The patient is nervous/anxious (But doing much better today.) and has insomnia (Much  better.  The decrease of depression.).      I have reviewed and (if needed) personally updated the patient's problem list, medications, allergies, past medical and surgical history, social and family history.   PAST MEDICAL HISTORY   Past Medical History:  Diagnosis Date  . Asthma    prn inhaler  . CAD S/P BMS PCI to prox LAD 03/31/2016   Ost LAD to Mid LAD lesion, 90% stenosed. Post intervention - Vision BMS 3.0 mm x 18 mm (~3.5 mm) there is a 0% residual stenosis.   . Charcot foot due to diabetes mellitus ([place]) 11/2016   left 2018; right 2019  . Depression   . Diabetic neuropathy (HCC)    feet  . ESRD (end stage renal disease) on dialysis ([place]) 12/2017   ?chronic interstitial nephritis  . H/O non-ST elevation myocardial infarction (NSTEMI) 03/2016   Found in 90% mid LAD lesion treated with bare-metal stent (BMS) PCI - vision BMS 3.0 mm x 18 mm  . History of MRSA infection   . Hyperlipidemia   . Hypertension    medication dose increased 11/22/2015; has been taking med. consistently since 03/2016, per  pt.  . Insulin dependent diabetes mellitus   . Iron deficiency anemia    takes iron supplement  . Retinopathy of both eyes   . Sickle cell trait ([place])   . 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 ACHILLES TENDON LENGTHENING Left 11/24/2016   Procedure: Left Achilles tendon lengthening (open);  Surgeon: Wylene Simmer, MD;  Location: [facility];  Service: Orthopedics;  Laterality: Left;  . AV FISTULA PLACEMENT Right 07/31/2017   Procedure: ARTERIOVENOUS BRACHIOCEPHALIC FISTULA CREATION RIGHT ARM;  Surgeon: Conrad Yellowstone, MD;  Location: [facility];  Service: Vascular;  Laterality: Right;  . CALCANEAL OSTEOTOMY Left 11/24/2016   Procedure: Left hindfoot osteotomy and fusion;  Surgeon: Wylene Simmer, MD;  Location: [facility];  Service: Orthopedics;  Laterality: Left;  . CARDIAC  CATHETERIZATION N/A 03/31/2016   Procedure: Left Heart Cath and Coronary Angiography;  Surgeon: Lorretta Harp, MD;  Location: [facility] INVASIVE CV LAB: 90% early mLAD, normal LV Fxn  . CARDIAC CATHETERIZATION N/A 03/31/2016   Procedure: Coronary Stent Intervention;  Surgeon: Lorretta Harp, MD;  Location: [facility] CV LAB;  Service: Cardiovascular: PCI to mLAD BMS Vision 3.0 mm x 18 mm  . CESAREAN SECTION  1997  . CORONARY ANGIOPLASTY    . FISTULA SUPERFICIALIZATION Right 11/15/2017   Procedure: FISTULA SUPERFICIALIZATION RIGHT BRACHIOCEPHALIC  RIGHT;  Surgeon: Conrad Taylor, MD;  Location: [facility];  Service: Vascular;  Laterality: Right;  . NM MYOVIEW LTD  08/2018   LOW RISK. EF 55-65%. No ischemia or Infarction  . TRANSTHORACIC ECHOCARDIOGRAM  03/2016   Normal LV size and thickness. EF 60-65%. GR 1 DD. Otherwise essentially normal.  . TUBAL LIGATION  1997    Cardiac catheterization with PCI May 2017:90% mLAD; nl LV Fxn--> PCI- Vision BMS3.7mx 18 mm    MEDICATIONS/ALLERGIES   Current Meds  Medication Sig  . ACCU-CHEK FASTCLIX LANCETS MISC Check blood sugar 4x/day  . acetaminophen (TYLENOL) 500 MG tablet Take 1,000 mg by mouth every 6 (six) hours as needed (for pain.).  .Marland Kitchenalbuterol (PROVENTIL HFA) 108 (90 Base) MCG/ACT inhaler INHALE 2 PUFFS BY MOUTH EVERY 4 HOURS AS NEEDED FOR COUGHING, WHEEZING, OR SHORTNESS OF BREATH  . aspirin EC 81 MG tablet Take 1 tablet (81 mg total) by mouth daily.  . Blood Glucose Monitoring Suppl (ACCU-CHEK GUIDE) w/Device KIT 1 each by Does not apply route 3 (three) times daily.  .Marland KitchenbuPROPion (WELLBUTRIN SR) 100 MG 12 hr tablet Take 1 tablet (100 mg total) by mouth 2 (two) times daily.  . cyclobenzaprine (FLEXERIL) 5 MG tablet Take 1 tablet (5 mg total) by mouth 3 (three) times daily as needed for muscle spasms.  .Marland Kitchengabapentin (NEURONTIN) 100 MG capsule Take 100 mg by mouth at bedtime.  .Marland Kitchenglucose blood (ACCU-CHEK GUIDE) test strip Check blood sugar up to 5  times a day  . icosapent Ethyl (VASCEPA) 1 g capsule Take 2 capsules (2 g total) by mouth 2 (two) times daily.  . insulin aspart (NOVOLOG) 100 UNIT/ML FlexPen Use 4-9U three times daily based on your carb counts and correction scale for a maximum of 24U daily  . insulin glargine (LANTUS) 100 UNIT/ML injection Inject 0.2 mLs (20 Units total) into the skin at bedtime.  . Insulin Pen Needle 32G X 4 MM MISC Use to inject insulin 3 times a day  . Insulin Syringe-Needle U-100 31G X  15/64" 0.3 ML MISC Use to inject insulin daily  . megestrol (MEGACE) 40 MG tablet Take 2 tablets (80 mg total) by mouth 2 (two) times daily.  . multivitamin (RENA-VIT) TABS tablet Take 1 tablet by mouth daily.  Marland Kitchen RENAGEL 800 MG tablet Take 4,800 mg by mouth See admin instructions. Take 4800 mg tablets three times daily with meals  . sertraline (ZOLOFT) 100 MG tablet Take 100 mg by mouth daily.    Allergies  Allergen Reactions  . Adhesive [Tape] Other (See Comments)    Irritation     SOCIAL HISTORY/FAMILY HISTORY   Reviewed in Epic:  Pertinent findings: n/a -- HD T, Th, Sat --> 04/13/2019 was a difficult year: Her sister died in 04-13-23 (potentially because of COVID-19), her recent ex-boyfriend died in either March 14, 2023 or Apr 13, 2023, this was followed by one of her close friends being murdered by the boyfriend.  --> Good news for 04/13/19 was that she had a new grandkids.  OBJCTIVE -PE, EKG, labs   Wt Readings from Last 3 Encounters:  06/22/20 230 lb (104.3 kg)  05/20/20 221 lb 1.6 oz (100.3 kg)  04/15/20 225 lb 3.2 oz (102.2 kg)    Physical Exam: BP (!) 158/88   Pulse 100   Ht 5\' 5"  (1.651 m)   Wt 230 lb (104.3 kg)   SpO2 98%   BMI 38.27 kg/m  Physical Exam Vitals reviewed.  Constitutional:      General: She is not in acute distress.    Appearance: Normal appearance. She is not ill-appearing.     Comments: Morbidly obese.  Well-groomed.  HENT:     Head: Normocephalic and atraumatic.  Eyes:     Extraocular Movements:  Extraocular movements intact.     Comments: Requiring square, pink tone sunglasses  Neck:     Vascular: No carotid bruit (Fistula at her throughout the upper rate vessel distribution.), hepatojugular reflux or JVD.  Cardiovascular:     Rate and Rhythm: Normal rate and regular rhythm. Occasional extrasystoles are present.    Chest Wall: PMI is not displaced (Unable to palpate).     Pulses: Decreased pulses (Both feet are warm to touch, but diminished pulses).     Heart sounds: S1 normal and S2 normal. Heart sounds are distant. No murmur heard.  Gallop present. S4 (Cannot exclude soft S4) sounds present.      Comments: Rate was lower on exam and compared to vital signs.  (Roughly) Pulmonary:     Effort: Pulmonary effort is normal. No respiratory distress.     Breath sounds: Rales (Mild bibasal rales) present. No rhonchi.  Chest:     Chest wall: No tenderness.  Abdominal:     General: Abdomen is flat. Bowel sounds are normal.     Palpations: Abdomen is soft. There is no mass.  Musculoskeletal:        General: Swelling (Trivial 1+) and deformity (The right foot looks remarkably back to normal.) present.     Cervical back: Normal range of motion. No edema.  Neurological:     General: No focal deficit present.     Mental Status: She is alert and oriented to person, place, and time.  Psychiatric:        Mood and Affect: Mood normal.        Behavior: Behavior normal.        Thought Content: Thought content normal.        Judgment: Judgment normal.     Comments: Much better, happy  mood.      Adult ECG Report None  Recent Labs: Only labs since December 2020 with A1c in March 2021 of 9.5. 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 Had considered the possibility ordering lower extremity Dopplers, but we can hold off for now.      CAD S/P BMS PCI to prox LAD - Primary (Chronic)    Pretty sizable LAD stent that was bare-metal.  We are now for about where longevity is pretty much DES-like.  No signs or symptoms of angina. Negative Myoview in 2019. Off Plavix.  Back on aspirin.  She is on statin and now back on beta-blocker which she is can avoid on her dialysis day.      Relevant Orders   Hepatic function panel   Lipid panel   Hyperlipidemia associated with type 2 diabetes mellitus (HCC) (Chronic)    Converted to rosuvastatin 40 mg.  She is due to get lipids checked.  We will see if they can get done at dialysis. Her last LDL was 100 and we made the switch to rosuvastatin.  Unfortunately she was mostly labs done when checked.      Relevant Orders   Hepatic function panel   Lipid panel   Essential hypertension (Chronic)    Her blood pressure is very much dependent dialysis.  Today is high, but it is low postdialysis.  We may need to come up with a solution to control her blood pressure on the long weekend days, but for now just simply continue current meds since she has been having some significant dizzy spells in the last few months.  I would defer to her nephrologist who can adjust based on more data.      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   The importance of social distancing and COVID-19 vaccination was discussed today.  I spent a total of 54mnutes with the patient. >  50% of the time was spent in direct patient consultation.  Additional time spent with chart review  / charting (studies, outside notes, etc): 8 Total Time: 31 min   Current medicines are reviewed at length with the patient today.  (+/- concerns) n/a  Notice: This dictation was prepared with Dragon dictation along with smaller phrase technology. Any transcriptional errors that result from this process are unintentional and may not be corrected upon review.  Patient Instructions /  Medication Changes & Studies & Tests Ordered   Patient Instructions  Medication Instructions:  No changes *If you need a refill on your cardiac medications before your next appointment, please call your pharmacy*   Lab Work:  lipid ,  Liver panel - 2 weeks  Fasting can have labs done at diaylsis If you have labs (blood work) drawn today and your tests are completely normal, you will receive your results only by: Marland Kitchen MyChart Message (if you have MyChart) OR . A paper copy in the mail If you have any lab test that is abnormal or we need to change your treatment, we will call you to review the results.  Other Instructions Please check and record blood pressure reading for the next 2 weeks  After dialysis  And  The evening before dialysis about 2 days, call give results   Testing/Procedures: Not needed   Follow-Up: At [facility], you and your health needs are our priority.  As part of our continuing mission to provide you with exceptional heart care, we have created designated Provider Care Teams.  These Care Teams include your primary Cardiologist (physician) and Advanced Practice Providers (APPs -  Physician Assistants and Nurse Practitioners) who  all work together to provide you with the care you need, when you need it.    Your next appointment:   6 month(s)  The format for your next appointment:   In Person  Provider:   Glenetta Hew, MD       Studies Ordered:   Orders Placed This Encounter  Procedures  . Hepatic function panel  . Lipid panel     Glenetta Hew, M.D., M.S. Interventional Cardiologist   Pager # [phone] Phone # [phone] [address] [place], [place] [address]   Thank you for choosing Heartcare at [facility]!!

## 2020-06-26 ENCOUNTER — Encounter: Payer: Self-pay | Admitting: Cardiology

## 2020-06-27 ENCOUNTER — Encounter: Payer: Self-pay | Admitting: Cardiology

## 2020-06-27 NOTE — Assessment & Plan Note (Signed)
No claudication symptoms. Had considered the possibility ordering lower extremity Dopplers, but we can hold off for now.

## 2020-06-27 NOTE — Assessment & Plan Note (Signed)
Her blood pressure is very much dependent dialysis.  Today is high, but it is low postdialysis.  We may need to come up with a solution to control her blood pressure on the long weekend days, but for now just simply continue current meds since she has been having some significant dizzy spells in the last few months.  I would defer to her nephrologist who can adjust based on more data.

## 2020-06-27 NOTE — Assessment & Plan Note (Signed)
Converted to rosuvastatin 40 mg.  She is due to get lipids checked.  We will see if they can get done at dialysis. Her last LDL was 100 and we made the switch to rosuvastatin.  Unfortunately she was mostly labs done when checked.

## 2020-06-27 NOTE — Assessment & Plan Note (Signed)
Pretty sizable LAD stent that was bare-metal.  We are now for about where longevity is pretty much DES-like.  No signs or symptoms of angina. Negative Myoview in 2019. Off Plavix.  Back on aspirin.  She is on statin and now back on beta-blocker which she is can avoid on her dialysis day.

## 2020-07-03 MED FILL — MEGESTROL 40 MG TABLET: 40 | 30 days supply | Qty: 120 | Fill #1

## 2020-07-10 ENCOUNTER — Telehealth: Payer: Medicare Other

## 2020-07-10 ENCOUNTER — Ambulatory Visit: Payer: Medicare Other | Admitting: *Deleted

## 2020-07-10 DIAGNOSIS — I10 Essential (primary) hypertension: Secondary | ICD-10-CM

## 2020-07-10 DIAGNOSIS — E113493 Type 2 diabetes mellitus with severe nonproliferative diabetic retinopathy without macular edema, bilateral: Secondary | ICD-10-CM

## 2020-07-10 DIAGNOSIS — Z992 Dependence on renal dialysis: Secondary | ICD-10-CM

## 2020-07-10 DIAGNOSIS — E1169 Type 2 diabetes mellitus with other specified complication: Secondary | ICD-10-CM

## 2020-07-10 DIAGNOSIS — N186 End stage renal disease: Secondary | ICD-10-CM

## 2020-07-10 NOTE — Patient Instructions (Signed)
Visit Information It was nice speaking with you today. Goals Addressed              This Visit's Progress     Patient Stated   .  "I need to get my diabetes under better control but my motivation comes and goes" (pt-stated)        [place] (see longitudinal plan of care for additional care plan information)  Current Barriers:  . Chronic Disease Management support, education, and care coordination needs related to CAD, HTN, HLD, DMII, and ESRD- patient states she is pursuing laser surgery for abnormal uterine bleeding but missed her OB gyn appointment on 06/22/20 because her driver got lost; it has been rescheduled for 9/29. She reports she saw her cardiologist on 8/9 and received a good report, he asked her to start monitoring her blood pressure because it has been running low post dialysis and he ordered a lipid panel and liver panel that will be drawn at dialysis, she says he  gave her a good report regarding her heart. She says she is now checking her blood sugar about once a day because she knows she needs to get better control of her diabetes. She says she received the written educational information on foods low in phosphorous and ESRD diet has no questions, she says her phosphorous levels remain high despite the medication she takes.  Clinical Goal(s) related to CAD, HTN, HLD, DMII, and ESRD:  Over the next 30-60 days, patient will:  . Work with the care management team to address educational, disease management, and care coordination needs  . Begin or continue self health monitoring activities as directed today . Call provider office for new or worsened signs and symptoms New or worsened symptom related to DM, HTN, CAD, ESRD . Call care management team with questions or concerns . Verbalize basic understanding of patient centered plan of care established today  Interventions related to CAD, HTN, HLD, DMII, and ESRD:  . Prior to interviewing patient, reviewed patient status,  including review of recent office visit notes, consultants reports, relevant laboratory and other test results, and medications, completed. . Assessed status of gynecological surgery  . Discussed outcome of cardiology appointment on 8/9 . Reviewed upcoming appointment with gynecologist on 9/29 . Encouraged patient to contact this CCM RN for any questions or concerns that may arise before next month's call  Patient Self Care Activities related to CAD, HTN, HLD, DMII, and ESRD:  . Patient is unable to independently self-manage chronic health conditions  Please see past updates related to this goal by clicking on the "Past Updates" button in the selected goal         The patient verbalized understanding of instructions provided today and declined a print copy of patient instruction materials.   The care management team will reach out to the patient again over the next 30-60 days.   Kelli Churn RN, CCM, [facility] RN Care Manager [phone]

## 2020-07-10 NOTE — Chronic Care Management (AMB) (Signed)
Chronic Care Management   Follow Up Note   07/10/2020 Name: Jane [name] MRN: [id number] DOB: [birth year]  Referred by: [name], MD Reason for referral : Chronic Care Management (IDDM, CAD, HTN, ESRD, HLD)   Jane [name] is a 44 y.o. year old female who is a primary care patient of [name], MD. The CCM team was consulted for assistance with chronic disease management and care coordination needs.    Review of patient status, including review of consultants reports, relevant laboratory and other test results, and collaboration with appropriate care team members and the patient's provider was performed as part of comprehensive patient evaluation and provision of chronic care management services.    SDOH (Social Determinants of Health) assessments performed: No See Care Plan activities for detailed interventions related to [name])     Outpatient Encounter Medications as of 07/10/2020  Medication Sig Note  . ACCU-CHEK FASTCLIX LANCETS MISC Check blood sugar 4x/day   . acetaminophen (TYLENOL) 500 MG tablet Take 1,000 mg by mouth every 6 (six) hours as needed (for pain.).   Marland Kitchen albuterol (PROVENTIL HFA) 108 (90 Base) MCG/ACT inhaler INHALE 2 PUFFS BY MOUTH EVERY 4 HOURS AS NEEDED FOR COUGHING, WHEEZING, OR SHORTNESS OF BREATH   . aspirin EC 81 MG tablet Take 1 tablet (81 mg total) by mouth daily.   . Blood Glucose Monitoring Suppl (ACCU-CHEK GUIDE) w/Device KIT 1 each by Does not apply route 3 (three) times daily.   Marland Kitchen buPROPion (WELLBUTRIN SR) 100 MG 12 hr tablet Take 1 tablet (100 mg total) by mouth 2 (two) times daily.   . cyclobenzaprine (FLEXERIL) 5 MG tablet Take 1 tablet (5 mg total) by mouth 3 (three) times daily as needed for muscle spasms.   Marland Kitchen gabapentin (NEURONTIN) 100 MG capsule Take 100 mg by mouth at bedtime.   Marland Kitchen glucose blood (ACCU-CHEK GUIDE) test strip Check blood sugar up to 5 times a day   . icosapent Ethyl (VASCEPA) 1 g capsule Take 2 capsules (2 g  total) by mouth 2 (two) times daily.   . insulin aspart (NOVOLOG) 100 UNIT/ML FlexPen Use 4-9U three times daily based on your carb counts and correction scale for a maximum of 24U daily 05/20/2020: Does not check BG; 6 u at each meal.  . insulin glargine (LANTUS) 100 UNIT/ML injection Inject 0.2 mLs (20 Units total) into the skin at bedtime.   . Insulin Pen Needle 32G X 4 MM MISC Use to inject insulin 3 times a day   . Insulin Syringe-Needle U-100 31G X 15/64" 0.3 ML MISC Use to inject insulin daily   . megestrol (MEGACE) 40 MG tablet Take 2 tablets (80 mg total) by mouth 2 (two) times daily.   . metoprolol tartrate (LOPRESSOR) 25 MG tablet Take 1 tablet (25 mg total) by mouth 2 (two) times daily. Do not take the night before dialysis, and take 2 hours after dialysis.   Marland Kitchen multivitamin (RENA-VIT) TABS tablet Take 1 tablet by mouth daily.   Marland Kitchen RENAGEL 800 MG tablet Take 4,800 mg by mouth See admin instructions. Take 4800 mg tablets three times daily with meals   . rosuvastatin (CRESTOR) 40 MG tablet Take 1 tablet (40 mg total) by mouth daily.   . sertraline (ZOLOFT) 100 MG tablet Take 100 mg by mouth daily.    No facility-administered encounter medications on file as of 07/10/2020.     Objective:  Wt Readings from Last 3 Encounters:  06/22/20 230 lb (104.3 kg)  05/20/20 221 lb 1.6 oz (100.3 kg)  04/15/20 225 lb 3.2 oz (102.2 kg)   Lab Results  Component Value Date   HGBA1C 9.5 (A) 01/29/2020   HGBA1C 8.2 (A) 11/29/2018   HGBA1C 9.1 (A) 08/01/2018   Lab Results  Component Value Date   MICROALBUR 40.6 (H) 08/05/2014   LDLCALC 100 (H) 10/15/2019   CREATININE 11.87 (H) 11/29/2018   Lab Results  Component Value Date   CHOL 186 10/15/2019   HDL 44 10/15/2019   LDLCALC 100 (H) 10/15/2019   TRIG 249 (H) 10/15/2019   CHOLHDL 4.2 10/15/2019    Goals Addressed              This Visit's Progress     Patient Stated   .  "I need to get my diabetes under better control but my  motivation comes and goes" (pt-stated)        [name] (see longitudinal plan of care for additional care plan information)  Current Barriers:  . Chronic Disease Management support, education, and care coordination needs related to CAD, HTN, HLD, DMII, and ESRD- patient states she is pursuing laser surgery for abnormal uterine bleeding but missed her OB gyn appointment on 06/22/20 because her driver got lost; it has been rescheduled for 9/29. She reports she saw her cardiologist on 8/9 and received a good report, he asked her to start monitoring her blood pressure because it has been running low post dialysis and he ordered a lipid panel and liver panel that will be drawn at dialysis, she says he  gave her a good report regarding her heart. She says she is now checking her blood sugar about once a day because she knows she needs to get better control of her diabetes. She says she received the written educational information on foods low in phosphorous and ESRD diet has no questions, she says her phosphorous levels remain high despite the medication she takes.  Clinical Goal(s) related to CAD, HTN, HLD, DMII, and ESRD:  Over the next 30-60 days, patient will:  . Work with the care management team to address educational, disease management, and care coordination needs  . Begin or continue self health monitoring activities as directed today . Call provider office for new or worsened signs and symptoms New or worsened symptom related to DM, HTN, CAD, ESRD . Call care management team with questions or concerns . Verbalize basic understanding of patient centered plan of care established today  Interventions related to CAD, HTN, HLD, DMII, and ESRD:  . Prior to interviewing patient, reviewed patient status, including review of recent office visit notes, consultants reports, relevant laboratory and other test results, and medications, completed. . Assessed status of gynecological surgery  . Discussed  outcome of cardiology appointment on 8/9 . Reviewed upcoming appointment with gynecologist on 9/29 . Encouraged patient to contact this CCM RN for any questions or concerns that may arise before next month's call  Patient Self Care Activities related to CAD, HTN, HLD, DMII, and ESRD:  . Patient is unable to independently self-manage chronic health conditions  Please see past updates related to this goal by clicking on the "Past Updates" button in the selected goal          Plan:   The care management team will reach out to the patient again over the next 30-60 days.    Kelli Churn RN, CCM, [facility] RN Care Manager [phone]

## 2020-07-13 NOTE — Progress Notes (Signed)
Internal Medicine Clinic Resident  I have personally reviewed this encounter including the documentation in this note and/or discussed this patient with the care management provider. I will address any urgent items identified by the care management provider and will communicate my actions to the patient's PCP. I have reviewed the patient's CCM visit with my supervising attending, Dr Heber Defiance.  [name], MD 07/13/2020

## 2020-08-10 ENCOUNTER — Telehealth: Payer: Self-pay | Admitting: *Deleted

## 2020-08-10 ENCOUNTER — Telehealth: Payer: Medicare Other

## 2020-08-10 NOTE — Telephone Encounter (Signed)
  Chronic Care Management   Outreach Note  08/10/2020 Name: Jane [name] MRN: [id number] DOB: [birth year]  Referred by: [name], MD Reason for referral : Chronic Care Management (CAD, ESRD, IDDM, HLD, obesity, depression)   An unsuccessful telephone outreach was attempted today for purpose of completing follow up assessment. Unable to leave message as recording states voice mailbox is full  The patient was referred to the case management team for assistance with care management and care coordination.   Follow Up Plan: The care management team will reach out to the patient again over the next 7-10 days.   Kelli Churn RN, CCM, [facility] RN Care Manager [phone]

## 2020-08-12 ENCOUNTER — Other Ambulatory Visit: Payer: Self-pay

## 2020-08-12 ENCOUNTER — Encounter: Payer: Self-pay | Admitting: Obstetrics & Gynecology

## 2020-08-12 ENCOUNTER — Ambulatory Visit (INDEPENDENT_AMBULATORY_CARE_PROVIDER_SITE_OTHER): Payer: Medicare Other | Admitting: Obstetrics & Gynecology

## 2020-08-12 VITALS — BP 125/66 | HR 97 | Ht 65.0 in | Wt 219.0 lb

## 2020-08-12 DIAGNOSIS — N939 Abnormal uterine and vaginal bleeding, unspecified: Secondary | ICD-10-CM

## 2020-08-12 NOTE — Patient Instructions (Signed)
Hysteroscopy Hysteroscopy is a procedure that is used to examine the inside of a woman's womb (uterus). This may be done for various reasons, including:  To look for lumps (tumors) and other growths in the uterus.  To evaluate abnormal bleeding, fibroid tumors, polyps, scar tissue (adhesions), or cancer of the uterus.  To determine the cause of an inability to get pregnant (infertility) or repeated losses of pregnancies (miscarriages).  To find a lost IUD (intrauterine device).  To perform a procedure that permanently prevents pregnancy (sterilization). During this procedure, a thin, flexible tube with a small light and camera (hysteroscope) is used to examine the uterus. The camera sends images to a monitor in the room so that your health care provider can view the inside of your uterus. A hysteroscopy should be done right after a menstrual period to make sure that you are not pregnant. Tell a health care provider about:  Any allergies you have.  All medicines you are taking, including vitamins, herbs, eye drops, creams, and over-the-counter medicines.  Any problems you or family members have had with the use of anesthetic medicines.  Any blood disorders you have.  Any surgeries you have had.  Any medical conditions you have.  Whether you are pregnant or may be pregnant. What are the risks? Generally, this is a safe procedure. However, problems may occur, including:  Excessive bleeding.  Infection.  Damage to the uterus or other structures or organs.  Allergic reaction to medicines or fluids that are used in the procedure. What happens before the procedure? Staying hydrated Follow instructions from your health care provider about hydration, which may include:  Up to 2 hours before the procedure - you may continue to drink clear liquids, such as water, clear fruit juice, black coffee, and plain tea. Eating and drinking restrictions Follow instructions from your health care  provider about eating and drinking, which may include:  8 hours before the procedure - stop eating solid foods and drink clear liquids only  2 hours before the procedure - stop drinking clear liquids. General instructions  Ask your health care provider about: ? Changing or stopping your normal medicines. This is important if you take diabetes medicines or blood thinners. ? Taking medicines such as aspirin and ibuprofen. These medicines can thin your blood and cause bleeding. Do not take these medicines for 1 week before your procedure, or as told by your health care provider.  Do not use any products that contain nicotine or tobacco for 2 weeks before the procedure. This includes cigarettes and e-cigarettes. If you need help quitting, ask your health care provider.  Medicine may be placed in your cervix the day before the procedure. This medicine causes the cervix to have a larger opening (dilate). The larger opening makes it easier for the hysteroscope to be inserted into the uterus during the procedure.  Plan to have someone with you for the first 24-48 hours after the procedure, especially if you are given a medicine to make you fall asleep (general anesthetic).  Plan to have someone take you home from the hospital or clinic. What happens during the procedure?  To lower your risk of infection: ? Your health care team will wash or sanitize their hands. ? Your skin will be washed with soap. ? Hair may be removed from the surgical area.  An IV tube will be inserted into one of your veins.  You may be given one or more of the following: ? A medicine to help  you relax (sedative). ? A medicine that numbs the area around the cervix (local anesthetic). ? A medicine to make you fall asleep (general anesthetic).  A hysteroscope will be inserted through your vagina and into your uterus.  Air or fluid will be used to enlarge your uterus, enabling your health care provider to see your uterus  better. The amount of fluid used will be carefully checked throughout the procedure.  In some cases, tissue may be gently scraped from inside the uterus and sent to a lab for testing (biopsy). The procedure may vary among health care providers and hospitals. What happens after the procedure?  Your blood pressure, heart rate, breathing rate, and blood oxygen level will be monitored until the medicines you were given have worn off.  You may have some cramping. You may be given medicines for this.  You may have bleeding, which varies from light spotting to menstrual-like bleeding. This is normal.  If you had a biopsy done, it is your responsibility to get the results of your procedure. Ask your health care provider, or the department performing the procedure, when your results will be ready. Summary  Hysteroscopy is a procedure that is used to examine the inside of a woman's womb (uterus).  After the procedure, you may have bleeding, which varies from light spotting to menstrual-like bleeding. This is normal. You may also have cramping.  Plan to have someone take you home from the hospital or clinic. This information is not intended to replace advice given to you by your health care provider. Make sure you discuss any questions you have with your health care provider. Document Revised: 10/13/2017 Document Reviewed: 11/29/2016 Elsevier Patient Education  2020 [place]. Endometrial Ablation Endometrial ablation is a procedure that destroys the thin inner layer of the lining of the uterus (endometrium). This procedure may be done:  To stop heavy periods.  To stop bleeding that is causing anemia.  To control irregular bleeding.  To treat bleeding caused by small tumors (fibroids) in the endometrium. This procedure is often an alternative to major surgery, such as removal of the uterus and cervix (hysterectomy). As a result of this procedure:  You may not be able to have children.  However, if you are premenopausal (you have not gone through menopause): ? You may still have a small chance of getting pregnant. ? You will need to use a reliable method of birth control after the procedure to prevent pregnancy.  You may stop having a menstrual period, or you may have only a small amount of bleeding during your period. Menstruation may return several years after the procedure. Tell a health care provider about:  Any allergies you have.  All medicines you are taking, including vitamins, herbs, eye drops, creams, and over-the-counter medicines.  Any problems you or family members have had with the use of anesthetic medicines.  Any blood disorders you have.  Any surgeries you have had.  Any medical conditions you have. What are the risks? Generally, this is a safe procedure. However, problems may occur, including:  A hole (perforation) in the uterus or bowel.  Infection of the uterus, bladder, or vagina.  Bleeding.  Damage to other structures or organs.  An air bubble in the lung (air embolus).  Problems with pregnancy after the procedure.  Failure of the procedure.  Decreased ability to diagnose cancer in the endometrium. What happens before the procedure?  You will have tests of your endometrium to make sure there are no  pre-cancerous cells or cancer cells present.  You may have an ultrasound of the uterus.  You may be given medicines to thin the endometrium.  Ask your health care provider about: ? Changing or stopping your regular medicines. This is especially important if you take diabetes medicines or blood thinners. ? Taking medicines such as aspirin and ibuprofen. These medicines can thin your blood. Do not take these medicines before your procedure if your doctor tells you not to.  Plan to have someone take you home from the hospital or clinic. What happens during the procedure?   You will lie on an exam table with your feet and legs  supported as in a pelvic exam.  To lower your risk of infection: ? Your health care team will wash or sanitize their hands and put on germ-free (sterile) gloves. ? Your genital area will be washed with soap.  An IV tube will be inserted into one of your veins.  You will be given a medicine to help you relax (sedative).  A surgical instrument with a light and camera (resectoscope) will be inserted into your vagina and moved into your uterus. This allows your surgeon to see inside your uterus.  Endometrial tissue will be removed using one of the following methods: ? Radiofrequency. This method uses a radiofrequency-alternating electric current to remove the endometrium. ? Cryotherapy. This method uses extreme cold to freeze the endometrium. ? Heated-free liquid. This method uses a heated saltwater (saline) solution to remove the endometrium. ? Microwave. This method uses high-energy microwaves to heat up the endometrium and remove it. ? Thermal balloon. This method involves inserting a catheter with a balloon tip into the uterus. The balloon tip is filled with heated fluid to remove the endometrium. The procedure may vary among health care providers and hospitals. What happens after the procedure?  Your blood pressure, heart rate, breathing rate, and blood oxygen level will be monitored until the medicines you were given have worn off.  As tissue healing occurs, you may notice vaginal bleeding for 4-6 weeks after the procedure. You may also experience: ? Cramps. ? Thin, watery vaginal discharge that is light pink or brown in color. ? A need to urinate more frequently than usual. ? Nausea.  Do not drive for 24 hours if you were given a sedative.  Do not have sex or insert anything into your vagina until your health care provider approves. Summary  Endometrial ablation is done to treat the many causes of heavy menstrual bleeding.  The procedure may be done only after medications have  been tried to control the bleeding.  Plan to have someone take you home from the hospital or clinic. This information is not intended to replace advice given to you by your health care provider. Make sure you discuss any questions you have with your health care provider. Document Revised: 04/17/2018 Document Reviewed: 11/17/2016 Elsevier Patient Education  [place].

## 2020-08-12 NOTE — Progress Notes (Signed)
History:  44 y.o. Jane [name] here today for f/u of AUB. Pt has been on Megace for AUB. The bleeding got worse and pt was seen by Dr. Harolyn Rutherford who increased the dosage of her meds. She was also seen and is s/p a EmBx. The results showed inadequate tissue despite 3 passes. Pt was in pain and wants to proceed with hysteroscopy and endometrial ablation she reports that her nephrologist recommended the procedure and is in favor of it as she is anemic due to the amount of bleeding. Pt is on dialysis. Pt remains on the Megace and, at present, she is not bleeding but, does not want to remain on the meds.     The following portions of the patient's history were reviewed and updated as appropriate: allergies, current medications, past family history, past medical history, past social history, past surgical history and problem list.  Review of Systems:  Pertinent items are noted in HPI.    Objective:  Physical Exam Blood pressure 125/66, pulse 97, height 5\' 5"  (1.651 m), weight 219 lb (99.3 kg).  CONSTITUTIONAL: Well-developed, well-nourished female in no acute distress.  HENT:  Normocephalic, atraumatic EYES: Conjunctivae and EOM are normal. No scleral icterus.  NECK: Normal range of motion SKIN: Skin is warm and dry. No rash noted. Not diaphoretic.No pallor. Ensign: Alert and oriented to person, place, and time. Normal coordination.  Pelvic: not repeated  Labs and Imaging 04/15/2020 CLINICAL DATA:  Initial evaluation for abnormal uterine bleeding.  EXAM: ULTRASOUND PELVIS TRANSVAGINAL  TECHNIQUE: Transvaginal ultrasound examination of the pelvis was performed including evaluation of the uterus, ovaries, adnexal regions, and pelvic cul-de-sac.  COMPARISON:  Prior ultrasound from 11/29/2018.  FINDINGS: Uterus  Measurements: 8.9 x 5.2 x 4.8 cm = volume: 115 mL. Uterus demonstrates a heterogeneous echotexture without discrete fibroid or other mass.  Endometrium  Thickness: 15 mm.  Endometrial complex demonstrates a mildly heterogeneous echotexture without focal abnormality. Small amount of mildly complex fluid noted within the endometrial canal at the level of the lower uterine segment/cervix.  Right ovary  Measurements: 2.8 x 1.3 x 2.1 cm = volume: 3.8 mL. Normal appearance/no adnexal mass.  Left ovary  Measurements: 3.7 x 1.6 x 2.1 cm = volume: 6.6 mL. Normal appearance/no adnexal mass.  Other findings:  Trace free fluid noted within the pelvis.  IMPRESSION: 1. Endometrial stripe measures up to 15 mm in thickness. If bleeding remains unresponsive to hormonal or medical therapy, sonohysterogram should be considered for focal lesion work-up. (Ref: Radiological Reasoning: Algorithmic Workup of Abnormal Vaginal Bleeding with Endovaginal Sonography and Sonohysterography. AJR 2008; 053:Z76-73). 2. Small amount of mildly complex fluid within the endometrial cavity, likely blood products given history of abnormal uterine bleeding. 3. Otherwise negative pelvic ultrasound.  05/20/2020 ENDOMETRIUM, BIOPSY:  - Benign endocervical glandular type mucosa present in the background of  a majority of blood. No distinct endometrial tissue present for  evaluation.   Assessment & Plan:  AUB- improved on higher doses of Megace.  Patient desires surgical management with hysteroscopy with endometrial ablation.  The risks of surgery were discussed in detail with the patient including but not limited to: bleeding which may require transfusion or reoperation; infection which may require prolonged hospitalization or re-hospitalization and antibiotic therapy; injury to bowel, bladder, ureters and major vessels or other surrounding organs; need for additional procedures including laparotomy; thromboembolic phenomenon, incisional problems and other postoperative or anesthesia complications.  Patient was told that the likelihood that her condition and symptoms will be treated  effectively  with this surgical management was very high; the postoperative expectations were also discussed in detail. The patient also understands the alternative treatment options which were discussed in full. All questions were answered.  She was told that she will be contacted by our surgical scheduler regarding the time and date of her surgery; routine preoperative instructions of having nothing to eat or drink after midnight on the day prior to surgery and also coming to the hospital 1 1/2 hours prior to her time of surgery were also emphasized.  She was told she may be called for a preoperative appointment about a week prior to surgery and will be given further preoperative instructions at that visit. Printed patient education handouts about the procedure were given to the patient to review at home.  Note sent to pts nephrologist to clear for surgery.   Total face-to-face time with patient was 20 min.  Greater than 50% was spent in counseling and coordination of care with the patient.   Darrelle Wiberg L. Harraway-Smith, M.D., [name]

## 2020-08-18 ENCOUNTER — Other Ambulatory Visit (HOSPITAL_COMMUNITY): Payer: Self-pay | Admitting: Nephrology

## 2020-08-18 MED FILL — AMLODIPINE BESYLATE 5 MG TA: 5 | 90 days supply | Qty: 90 | Fill #0

## 2020-08-19 ENCOUNTER — Telehealth: Payer: Medicare Other

## 2020-08-20 ENCOUNTER — Other Ambulatory Visit: Payer: Self-pay | Admitting: Internal Medicine

## 2020-08-20 ENCOUNTER — Ambulatory Visit: Payer: Medicare Other | Admitting: *Deleted

## 2020-08-20 DIAGNOSIS — I10 Essential (primary) hypertension: Secondary | ICD-10-CM

## 2020-08-20 DIAGNOSIS — N186 End stage renal disease: Secondary | ICD-10-CM

## 2020-08-20 DIAGNOSIS — E785 Hyperlipidemia, unspecified: Secondary | ICD-10-CM

## 2020-08-20 DIAGNOSIS — Z992 Dependence on renal dialysis: Secondary | ICD-10-CM

## 2020-08-20 DIAGNOSIS — E083413 Diabetes mellitus due to underlying condition with severe nonproliferative diabetic retinopathy with macular edema, bilateral: Secondary | ICD-10-CM

## 2020-08-20 DIAGNOSIS — E113493 Type 2 diabetes mellitus with severe nonproliferative diabetic retinopathy without macular edema, bilateral: Secondary | ICD-10-CM

## 2020-08-20 DIAGNOSIS — Z794 Long term (current) use of insulin: Secondary | ICD-10-CM

## 2020-08-20 DIAGNOSIS — E1169 Type 2 diabetes mellitus with other specified complication: Secondary | ICD-10-CM

## 2020-08-20 MED ORDER — INSULIN LISPRO (1 UNIT DIAL) 100 UNIT/ML (KWIKPEN): 4.0000 [IU] | PEN_INJECTOR | Freq: Three times a day (TID) | SUBCUTANEOUS | 11 refills | Status: DC

## 2020-08-20 MED FILL — HUMALOG 100 UNITS/ML KWIKPE: 100 | 63 days supply | Qty: 15 | Fill #0

## 2020-08-20 NOTE — Patient Instructions (Signed)
Visit Information It was nice speaking with you today. I am so sorry about the recent death of so many of your friends. Please reach out to your family, friends and therapist for support.  Goals Addressed              This Visit's Progress     Patient Stated   .  "I need to get my diabetes under better control but my motivation comes and goes" (pt-stated)        Jane [name] (see longitudinal plan of care for additional care plan information)  Current Barriers:  . Chronic Disease Management support, education, and care coordination needs related to CAD, HTN, HLD, DMII, and ESRD- patient states she will have outpatient laser surgery for abnormal uterine bleeding on 09/15/20, she says she will talk to her family and friend about someone staying with her as she recovers. She reports she is aware that the lipid panel and liver panel that Dr [name] ordered during her 06/22/20 office visit with him has not yet been drawn as she was going to have it done at dialysis, she says she will ask when she goes for her preoperative labs for her gynecological surgery.  she says she has experienced many deaths in the last few week including her female friend of 8 years who died from [place] on Sep 01, 2023. She says she has supportive family and friends who are helping her deal with her grief and she also has a behavioral health counselor she can reach out to prn. She says she is a bit fearful of going to the hospital for her outpatient surgery because of the Covid pandemic but takes comfort that she will be in the hospital a short while for the surgery  and will go home the same day if there are no complications.   Clinical Goal(s) related to CAD, HTN, HLD, DMII, and ESRD:  Over the next 30-60 days, patient will:  . Work with the care management team to address educational, disease management, and care coordination needs  . Begin or continue self health monitoring activities as directed today . Call provider office for  new or worsened signs and symptoms New or worsened symptom related to DM, HTN, CAD, ESRD . Call care management team with questions or concerns . Verbalize basic understanding of patient centered plan of care established today  Interventions related to CAD, HTN, HLD, DMII, and ESRD:  . Prior to interviewing patient, reviewed patient status, including review of recent office visit notes, consultants reports, relevant laboratory and other test results, and medications, completed. . Provided adequate time for patient to discuss the recent deaths of her friends and emotional support provided  . Assessed status of gynecological surgery  . Informed patient this CCM RN will contact her to complete a transition of care call within 48 hours of her hospital discharge . Encouraged her to complete labs of lipid profile and liver panel ordered by Dr [name] on 06/22/20 if possible when she has her preoperative labs done . Discussed preparation for gynecological surgery scheduled for 09/15/20 . Encouraged patient to contact this CCM RN for any questions or concerns that may arise before next month's call  Patient Self Care Activities related to CAD, HTN, HLD, DMII, and ESRD:  . Patient is unable to independently self-manage chronic health conditions  Please see past updates related to this goal by clicking on the "Past Updates" button in the selected goal        Other   .  Blood Pressure < 140/90        BP Readings from Last 3 Encounters:  08/12/20 125/66  06/22/20 (!) 158/88  05/20/20 132/82         .  LDL CALC < 100        Lab Results  Component Value Date   CHOL 186 10/15/2019   HDL 44 10/15/2019   LDLCALC 100 (H) 10/15/2019   TRIG 249 (H) 10/15/2019   CHOLHDL 4.2 10/15/2019   Encouraged patient to have lipid panel drawn ,that were ordered 06/22/20,when she has her preoperative labs        The patient verbalized understanding of instructions provided today and declined a print copy of patient  instruction materials.   The care management team will reach out to the patient again over the next 30 days.   Kelli Churn RN, CCM, [facility] RN Care Manager [phone]

## 2020-08-20 NOTE — Progress Notes (Signed)
Internal Medicine Clinic Resident  I have personally reviewed this encounter including the documentation in this note and/or discussed this patient with the care management provider. I will address any urgent items identified by the care management provider and will communicate my actions to the patient's PCP. I have reviewed the patient's CCM visit with my supervising attending, Dr Evette Doffing.  Mosetta Anis, MD 08/20/2020

## 2020-08-20 NOTE — Chronic Care Management (AMB) (Signed)
Chronic Care Management   Follow Up Note   08/20/2020 Name: Jane [name] MRN: [id number] DOB: 1976/10/12  Referred by: [name], MD Reason for referral : Chronic Care Management (CAD, ESRD, IDDM, HLD, obesity, depression)   Jane [name] is a 44 y.o. year old female who is a primary care patient of [name], MD. The CCM team was consulted for assistance with chronic disease management and care coordination needs.    Review of patient status, including review of consultants reports, relevant laboratory and other test results, and collaboration with appropriate care team members and the patient's provider was performed as part of comprehensive patient evaluation and provision of chronic care management services.    SDOH (Social Determinants of Health) assessments performed: No See Care Plan activities for detailed interventions related to [facility])     Outpatient Encounter Medications as of 08/20/2020  Medication Sig Note  . ACCU-CHEK FASTCLIX LANCETS MISC Check blood sugar 4x/day   . acetaminophen (TYLENOL) 500 MG tablet Take 1,000 mg by mouth every 6 (six) hours as needed (for pain.).   Marland Kitchen albuterol (PROVENTIL HFA) 108 (90 Base) MCG/ACT inhaler INHALE 2 PUFFS BY MOUTH EVERY 4 HOURS AS NEEDED FOR COUGHING, WHEEZING, OR SHORTNESS OF BREATH   . aspirin EC 81 MG tablet Take 1 tablet (81 mg total) by mouth daily.   . Blood Glucose Monitoring Suppl (ACCU-CHEK GUIDE) w/Device KIT 1 each by Does not apply route 3 (three) times daily.   Marland Kitchen buPROPion (WELLBUTRIN SR) 100 MG 12 hr tablet Take 1 tablet (100 mg total) by mouth 2 (two) times daily.   . cyclobenzaprine (FLEXERIL) 5 MG tablet Take 1 tablet (5 mg total) by mouth 3 (three) times daily as needed for muscle spasms.   Marland Kitchen gabapentin (NEURONTIN) 100 MG capsule Take 100 mg by mouth at bedtime.   Marland Kitchen glucose blood (ACCU-CHEK GUIDE) test strip Check blood sugar up to 5 times a day   . icosapent Ethyl (VASCEPA) 1 g capsule Take 2  capsules (2 g total) by mouth 2 (two) times daily.   . insulin aspart (NOVOLOG) 100 UNIT/ML FlexPen Use 4-9U three times daily based on your carb counts and correction scale for a maximum of 24U daily 05/20/2020: Does not check BG; 6 u at each meal.  . insulin glargine (LANTUS) 100 UNIT/ML injection Inject 0.2 mLs (20 Units total) into the skin at bedtime.   . Insulin Pen Needle 32G X 4 MM MISC Use to inject insulin 3 times a day   . Insulin Syringe-Needle U-100 31G X 15/64" 0.3 ML MISC Use to inject insulin daily   . megestrol (MEGACE) 40 MG tablet Take 2 tablets (80 mg total) by mouth 2 (two) times daily.   . metoprolol tartrate (LOPRESSOR) 25 MG tablet Take 1 tablet (25 mg total) by mouth 2 (two) times daily. Do not take the night before dialysis, and take 2 hours after dialysis.   Marland Kitchen multivitamin (RENA-VIT) TABS tablet Take 1 tablet by mouth daily.   Marland Kitchen RENAGEL 800 MG tablet Take 4,800 mg by mouth See admin instructions. Take 4800 mg tablets three times daily with meals   . rosuvastatin (CRESTOR) 40 MG tablet Take 1 tablet (40 mg total) by mouth daily.   . sertraline (ZOLOFT) 100 MG tablet Take 100 mg by mouth daily.     No facility-administered encounter medications on file as of 08/20/2020.     Objective:   Goals Addressed  This Visit's Progress     Patient Stated   .  "I need to get my diabetes under better control but my motivation comes and goes" (pt-stated)        [name] (see longitudinal plan of care for additional care plan information)  Current Barriers:  . Chronic Disease Management support, education, and care coordination needs related to CAD, HTN, HLD, DMII, and ESRD- patient states she will have outpatient laser surgery for abnormal uterine bleeding on 09/15/20, she says she will talk to her family and friend about someone staying with her as she recovers. She reports she is aware that the lipid panel and liver panel that Dr [name] ordered during her  06/22/20 office visit with him has not yet been drawn as she was going to have it done at dialysis, she says she will ask when she goes for her preoperative labs for her gynecological surgery.  she says she has experienced many deaths in the last few week including her female friend of 8 years who died from [place] on 2023-08-29. She says she has supportive family and friends who are helping her deal with her grief and she also has a behavioral health counselor she can reach out to prn. She says she is a bit fearful of going to the hospital for her outpatient surgery because of the Covid pandemic but takes comfort that she will be in the hospital a short while for the surgery  and will go home the same day if there are no complications.   Clinical Goal(s) related to CAD, HTN, HLD, DMII, and ESRD:  Over the next 30-60 days, patient will:  . Work with the care management team to address educational, disease management, and care coordination needs  . Begin or continue self health monitoring activities as directed today . Call provider office for new or worsened signs and symptoms New or worsened symptom related to DM, HTN, CAD, ESRD . Call care management team with questions or concerns . Verbalize basic understanding of patient centered plan of care established today  Interventions related to CAD, HTN, HLD, DMII, and ESRD:  . Prior to interviewing patient, reviewed patient status, including review of recent office visit notes, consultants reports, relevant laboratory and other test results, and medications, completed. . Provided adequate time for patient to discuss the recent deaths of her friends and emotional support provided  . Assessed status of gynecological surgery  . Informed patient this CCM RN will contact her to complete a transition of care call within 48 hours of her hospital discharge . Encouraged her to complete labs of lipid profile and liver panel ordered by Dr [name] on 06/22/20 if possible when she  has her preoperative labs done . Discussed preparation for gynecological surgery scheduled for 09/15/20 . Encouraged patient to contact this CCM RN for any questions or concerns that may arise before next month's call  Patient Self Care Activities related to CAD, HTN, HLD, DMII, and ESRD:  . Patient is unable to independently self-manage chronic health conditions  Please see past updates related to this goal by clicking on the "Past Updates" button in the selected goal        Other   .  Blood Pressure < 140/90        BP Readings from Last 3 Encounters:  08/12/20 125/66  06/22/20 (!) 158/88  05/20/20 132/82         .  LDL CALC < 100  Lab Results  Component Value Date   CHOL 186 10/15/2019   HDL 44 10/15/2019   LDLCALC 100 (H) 10/15/2019   TRIG 249 (H) 10/15/2019   CHOLHDL 4.2 10/15/2019   Encouraged patient to have lipid panel drawn ,that were ordered 06/22/20,when she has her preoperative labs         Plan:   The care management team will reach out to the patient again over the next 30 days.    Kelli Churn RN, CCM, [facility] RN Care Manager [phone]

## 2020-08-20 NOTE — Progress Notes (Signed)
Received message from pharmacy requesting change in Brand for patient's short term insulin. Humalog script sent. Novolog script cancelled.

## 2020-08-25 MED FILL — MEGESTROL 40 MG TABLET: 40 | 30 days supply | Qty: 120 | Fill #2

## 2020-09-02 MED FILL — GABAPENTIN 100 MG CAPSULE: 100 | 30 days supply | Qty: 30 | Fill #1

## 2020-09-03 ENCOUNTER — Other Ambulatory Visit (HOSPITAL_COMMUNITY): Payer: Self-pay | Admitting: Nephrology

## 2020-09-03 MED FILL — MIRTAZAPINE 15 MG TABLET: 15 | 60 days supply | Qty: 60 | Fill #0

## 2020-09-08 ENCOUNTER — Encounter (HOSPITAL_BASED_OUTPATIENT_CLINIC_OR_DEPARTMENT_OTHER): Payer: Self-pay | Admitting: Obstetrics & Gynecology

## 2020-09-08 ENCOUNTER — Other Ambulatory Visit: Payer: Self-pay

## 2020-09-08 NOTE — Progress Notes (Addendum)
ADDENDUM:  Chart reviewed by anesthesia, [name],  Unsure if DM is controlled as pt doesn't check at home and last A1c was months ago and high.  Pt will need blood sugar evaluated dos and if incredibly high it could cancel or delay surgery.   Spoke w/ via phone for pre-op interview--- PT Lab needs dos----  CBC, BMP, T&S             Lab results------ current ekg in epic/ chart COVID test ------ 09-11-2020 @ 0905 Arrive at ------- 1200 NPO after MN NO Solid Food.  Clear liquids from MN until--- 1100 Medications to take morning of surgery ----- Wellbutrin, Metoprolol Diabetic medication ----- do half lantus dose night before surgery, do humalog as ususal night before surgery but do not do morning of surgery Patient Special Instructions ----- asked to bring rescue inhaler dos Pre-Op special Istructions -----  Pt verbalized understanding to have responsible person to pick-up and a caregiver next 24 hours.  Pt stated she will be dropped off by transportation, her cousin [name] , will pick her up and cousin -- [name] will be her caregiver.  Pt aware to have contact numbers with her at check in.   Patient verbalized understanding of instructions that were given at this phone interview. Patient denies shortness of breath, chest pain, fever, cough at this phone interview.   Anesthesia Review:  HTN,  Hx NSTEMI / CAD s/p  PCI with BMS to pLAD 05/ 2017 , Asthma, IDDM, ESRD with hemodialysis T/TH/S (pt she arranged for her dialysis to be on Monday 09-14-2020 day before surgery),   Pt denies any cardiac s&s, no sob usual activities but does get sob flight stairs.  Chart to be reviewed by anesthesia.  PCP:  Dr [name] Cardiologist :  Dr [name] [facility] 06-22-2020 epic) Nephrologist:  Dr [name]. [place] ([facility]/ [facility] on [address]) Chest x-ray :  09-11-2018 epic EKG : 04-01-2020 epic Echo : 03-30-2016 epic Stress test: Nuclear 09-13-2018 epic Cardiac Cath :  03-31-2016 epic Activity  level:  See above Sleep Study/ CPAP : NO Fasting Blood Sugar :      / Checks Blood Sugar -- times a day:  Pt stated she does not blood sugar's at home Blood Thinner/ Instructions [name] Dose:  NO ASA / Instructions/ Last Dose :  ASA 81mg / pt stated was given any instructions from Dr Ihor Dow to stop or not, advised pt to call office to get Dr Tamala Julian instructions.

## 2020-09-09 ENCOUNTER — Encounter: Payer: Medicare Other | Admitting: Internal Medicine

## 2020-09-11 ENCOUNTER — Other Ambulatory Visit (HOSPITAL_COMMUNITY)
Admission: RE | Admit: 2020-09-11 | Discharge: 2020-09-11 | Disposition: A | Discharge status: Home / Self Care | Payer: Medicare Other | Source: Ambulatory Visit | Attending: Obstetrics & Gynecology | Admitting: Obstetrics & Gynecology

## 2020-09-11 DIAGNOSIS — Z20822 Contact with and (suspected) exposure to covid-19: Secondary | ICD-10-CM | POA: Diagnosis not present

## 2020-09-11 DIAGNOSIS — Z01812 Encounter for preprocedural laboratory examination: Secondary | ICD-10-CM | POA: Insufficient documentation

## 2020-09-11 LAB — SARS CORONAVIRUS 2 (TAT 6-24 HRS): SARS Coronavirus 2: NEGATIVE

## 2020-09-14 MED FILL — ALBUTEROL SULFATE HFA 108 (: 108 (90 BAS | 16 days supply | Qty: 9 | Fill #1

## 2020-09-14 MED FILL — MIRTAZAPINE 15 MG TABLET: 15 | 60 days supply | Qty: 60 | Fill #0

## 2020-09-15 ENCOUNTER — Encounter (HOSPITAL_BASED_OUTPATIENT_CLINIC_OR_DEPARTMENT_OTHER)
Admission: RE | Disposition: A | Discharge status: Home / Self Care | Payer: Self-pay | Source: Home / Self Care | Attending: Obstetrics & Gynecology

## 2020-09-15 ENCOUNTER — Encounter (HOSPITAL_BASED_OUTPATIENT_CLINIC_OR_DEPARTMENT_OTHER): Payer: Self-pay | Admitting: Obstetrics & Gynecology

## 2020-09-15 ENCOUNTER — Ambulatory Visit (HOSPITAL_BASED_OUTPATIENT_CLINIC_OR_DEPARTMENT_OTHER)
Admission: RE | Admit: 2020-09-15 | Discharge: 2020-09-15 | Disposition: A | Discharge status: Home / Self Care | Payer: Medicare Other | Attending: Obstetrics & Gynecology | Admitting: Obstetrics & Gynecology

## 2020-09-15 ENCOUNTER — Ambulatory Visit (HOSPITAL_BASED_OUTPATIENT_CLINIC_OR_DEPARTMENT_OTHER): Payer: Medicare Other | Admitting: Emergency Medicine

## 2020-09-15 DIAGNOSIS — Z794 Long term (current) use of insulin: Secondary | ICD-10-CM | POA: Insufficient documentation

## 2020-09-15 DIAGNOSIS — Z111 Encounter for screening for respiratory tuberculosis: Secondary | ICD-10-CM

## 2020-09-15 DIAGNOSIS — N939 Abnormal uterine and vaginal bleeding, unspecified: Secondary | ICD-10-CM | POA: Diagnosis present

## 2020-09-15 DIAGNOSIS — N186 End stage renal disease: Secondary | ICD-10-CM | POA: Diagnosis not present

## 2020-09-15 DIAGNOSIS — Z992 Dependence on renal dialysis: Secondary | ICD-10-CM | POA: Diagnosis not present

## 2020-09-15 DIAGNOSIS — Z79899 Other long term (current) drug therapy: Secondary | ICD-10-CM | POA: Insufficient documentation

## 2020-09-15 DIAGNOSIS — I120 Hypertensive chronic kidney disease with stage 5 chronic kidney disease or end stage renal disease: Secondary | ICD-10-CM | POA: Insufficient documentation

## 2020-09-15 DIAGNOSIS — Z7982 Long term (current) use of aspirin: Secondary | ICD-10-CM | POA: Diagnosis not present

## 2020-09-15 DIAGNOSIS — N800 Endometriosis of uterus: Secondary | ICD-10-CM | POA: Diagnosis not present

## 2020-09-15 DIAGNOSIS — Z91048 Other nonmedicinal substance allergy status: Secondary | ICD-10-CM | POA: Insufficient documentation

## 2020-09-15 DIAGNOSIS — E1122 Type 2 diabetes mellitus with diabetic chronic kidney disease: Secondary | ICD-10-CM | POA: Insufficient documentation

## 2020-09-15 DIAGNOSIS — Z79818 Long term (current) use of other agents affecting estrogen receptors and estrogen levels: Secondary | ICD-10-CM | POA: Insufficient documentation

## 2020-09-15 DIAGNOSIS — Z87891 Personal history of nicotine dependence: Secondary | ICD-10-CM | POA: Diagnosis not present

## 2020-09-15 DIAGNOSIS — Z888 Allergy status to other drugs, medicaments and biological substances status: Secondary | ICD-10-CM | POA: Diagnosis not present

## 2020-09-15 HISTORY — DX: Type 2 diabetes mellitus with severe nonproliferative diabetic retinopathy without macular edema, unspecified eye: E11.3499

## 2020-09-15 HISTORY — PX: DILITATION & CURRETTAGE/HYSTROSCOPY WITH NOVASURE ABLATION: SHX5568

## 2020-09-15 HISTORY — DX: Abnormal uterine and vaginal bleeding, unspecified: N93.9

## 2020-09-15 HISTORY — DX: Type 2 diabetes mellitus without complications: Z79.4

## 2020-09-15 HISTORY — DX: Disorder of arteries and arterioles, unspecified: I77.9

## 2020-09-15 HISTORY — DX: Iron deficiency anemia, unspecified: D50.9

## 2020-09-15 HISTORY — DX: Type 2 diabetes mellitus without complications: E11.9

## 2020-09-15 LAB — GLUCOSE, CAPILLARY
Glucose-Capillary: 206 mg/dL — ABNORMAL HIGH (ref 70–99)
Glucose-Capillary: 178 mg/dL — ABNORMAL HIGH (ref 70–99)

## 2020-09-15 LAB — POCT PREGNANCY, URINE: Preg Test, Ur: NEGATIVE

## 2020-09-15 SURGERY — DILATATION & CURETTAGE/HYSTEROSCOPY WITH NOVASURE ABLATION
Anesthesia: General | Site: Vagina

## 2020-09-15 MED ORDER — SODIUM CHLORIDE 0.9 % IR SOLN
Status: DC | PRN
  Administered 2020-09-15: 3000 mL

## 2020-09-15 MED ORDER — FENTANYL CITRATE (PF) 100 MCG/2ML IJ SOLN
INTRAMUSCULAR | Status: DC | PRN
Start: 2020-09-15 — End: 2020-09-15
  Administered 2020-09-15 (×2): 50 ug via INTRAVENOUS

## 2020-09-15 MED ORDER — FENTANYL CITRATE (PF) 100 MCG/2ML IJ SOLN
INTRAMUSCULAR | Status: AC
  Filled 2020-09-15: qty 2

## 2020-09-15 MED ORDER — PROPOFOL 10 MG/ML IV BOLUS
INTRAVENOUS | Status: DC | PRN
  Administered 2020-09-15: 140 mg via INTRAVENOUS

## 2020-09-15 MED ORDER — ONDANSETRON HCL 4 MG/2ML IJ SOLN
INTRAMUSCULAR | Status: DC | PRN
  Administered 2020-09-15: 4 mg via INTRAVENOUS

## 2020-09-15 MED ORDER — MIDAZOLAM HCL 2 MG/2ML IJ SOLN
INTRAMUSCULAR | Status: AC
  Filled 2020-09-15: qty 2

## 2020-09-15 MED ORDER — MIDAZOLAM HCL 5 MG/5ML IJ SOLN
INTRAMUSCULAR | Status: DC | PRN
  Administered 2020-09-15: 2 mg via INTRAVENOUS

## 2020-09-15 MED ORDER — OXYCODONE HCL 5 MG PO TABS
5.0000 mg | ORAL_TABLET | Freq: Once | ORAL | Status: AC | PRN
  Administered 2020-09-15: 5 mg via ORAL

## 2020-09-15 MED ORDER — MEPERIDINE HCL 25 MG/ML IJ SOLN: 6.2500 mg | INTRAMUSCULAR | Status: DC | PRN

## 2020-09-15 MED ORDER — ONDANSETRON HCL 4 MG/2ML IJ SOLN
INTRAMUSCULAR | Status: AC
  Filled 2020-09-15: qty 2

## 2020-09-15 MED ORDER — ACETAMINOPHEN 325 MG PO TABS: 325.0000 mg | ORAL_TABLET | ORAL | Status: DC | PRN

## 2020-09-15 MED ORDER — LACTATED RINGERS IV SOLN: INTRAVENOUS | Status: DC

## 2020-09-15 MED ORDER — SODIUM CHLORIDE 0.9 % IV SOLN: INTRAVENOUS | Status: DC

## 2020-09-15 MED ORDER — LIDOCAINE 2% (20 MG/ML) 5 ML SYRINGE
INTRAMUSCULAR | Status: DC | PRN
  Administered 2020-09-15: 80 mg via INTRAVENOUS

## 2020-09-15 MED ORDER — OXYCODONE HCL 5 MG PO TABS
ORAL_TABLET | ORAL | Status: AC
  Filled 2020-09-15: qty 1

## 2020-09-15 MED ORDER — ACETAMINOPHEN 10 MG/ML IV SOLN: 1000.0000 mg | Freq: Once | INTRAVENOUS | Status: DC | PRN

## 2020-09-15 MED ORDER — ONDANSETRON HCL 4 MG/2ML IJ SOLN: 4.0000 mg | Freq: Once | INTRAMUSCULAR | Status: DC | PRN

## 2020-09-15 MED ORDER — BUPIVACAINE HCL (PF) 0.5 % IJ SOLN
INTRAMUSCULAR | Status: DC | PRN
  Administered 2020-09-15: 20 mL

## 2020-09-15 MED ORDER — ACETAMINOPHEN 160 MG/5ML PO SOLN: 325.0000 mg | ORAL | Status: DC | PRN

## 2020-09-15 MED ORDER — DEXAMETHASONE SODIUM PHOSPHATE 10 MG/ML IJ SOLN
INTRAMUSCULAR | Status: AC
  Filled 2020-09-15: qty 1

## 2020-09-15 MED ORDER — FENTANYL CITRATE (PF) 100 MCG/2ML IJ SOLN
25.0000 ug | INTRAMUSCULAR | Status: DC | PRN
  Administered 2020-09-15 (×2): 50 ug via INTRAVENOUS

## 2020-09-15 MED ORDER — OXYCODONE HCL 5 MG/5ML PO SOLN: 5.0000 mg | Freq: Once | ORAL | Status: AC | PRN

## 2020-09-15 MED ORDER — PROPOFOL 10 MG/ML IV BOLUS
INTRAVENOUS | Status: AC
  Filled 2020-09-15: qty 20

## 2020-09-15 MED ORDER — POVIDONE-IODINE 10 % EX SWAB: 2.0000 "application " | Freq: Once | CUTANEOUS | Status: DC

## 2020-09-15 MED ORDER — DEXAMETHASONE SODIUM PHOSPHATE 10 MG/ML IJ SOLN
INTRAMUSCULAR | Status: DC | PRN
  Administered 2020-09-15: 10 mg via INTRAVENOUS

## 2020-09-15 MED ORDER — LIDOCAINE 2% (20 MG/ML) 5 ML SYRINGE
INTRAMUSCULAR | Status: AC
  Filled 2020-09-15: qty 5

## 2020-09-15 SURGICAL SUPPLY — 13 items
CATH ROBINSON RED A/P 16FR (CATHETERS) ×2 IMPLANT
COVER WAND RF STERILE (DRAPES) ×2 IMPLANT
GAUZE 4X4 16PLY RFD (DISPOSABLE) ×2 IMPLANT
GLOVE BIO SURGEON STRL SZ7 (GLOVE) ×6 IMPLANT
GLOVE BIOGEL PI IND STRL 7.0 (GLOVE) ×3 IMPLANT
GLOVE BIOGEL PI INDICATOR 7.0 (GLOVE) ×3
GOWN STRL REUS W/TWL LRG LVL3 (GOWN DISPOSABLE) ×4 IMPLANT
IV NS IRRIG 3000ML ARTHROMATIC (IV SOLUTION) ×2 IMPLANT
KIT PROCEDURE FLUENT (KITS) ×2 IMPLANT
PACK VAGINAL MINOR WOMEN LF (CUSTOM PROCEDURE TRAY) ×2 IMPLANT
PAD OB MATERNITY 4.3X12.25 (PERSONAL CARE ITEMS) ×2 IMPLANT
SEAL ROD LENS SCOPE MYOSURE (ABLATOR) IMPLANT
TOWEL OR 17X26 10 PK STRL BLUE (TOWEL DISPOSABLE) ×2 IMPLANT

## 2020-09-15 NOTE — Op Note (Signed)
09/15/2020  2:19 PM  PATIENT:  Jane [name]  44 y.o. female  PRE-OPERATIVE DIAGNOSIS:  AUB  POST-OPERATIVE DIAGNOSIS:  AUB  PROCEDURE:  Procedure(s): DILATATION & CURETTAGE/HYSTEROSCOPY (N/A)  SURGEON:  Surgeon(s) and Role:    * Lavonia Drafts, MD - Primary  ANESTHESIA:   general and paracervical block  EBL:  Minimal    BLOOD ADMINISTERED:none  DRAINS: none   LOCAL MEDICATIONS USED:  MARCAINE     SPECIMEN:  Source of Specimen:  endometrial currettings.   DISPOSITION OF SPECIMEN:  PATHOLOGY  COUNTS:  YES  TOURNIQUET:  * No tourniquets in log *  DICTATION: .Note written in [place]: Discharge to home after PACU  PATIENT DISPOSITION:  PACU - hemodynamically stable.   Delay start of Pharmacological VTE agent (>24hrs) due to surgical blood loss or risk of bleeding: not applicable  Complications: none immediate.   INDICATIONS: 44 y.o. [id number]  here for scheduled surgery for AUB.   Risks of surgery were discussed with the patient including but not limited to: bleeding which may require transfusion; infection which may require antibiotics; injury to uterus or surrounding organs; intrauterine scarring which may impair future fertility; need for additional procedures including laparotomy or laparoscopy; and other postoperative/anesthesia complications. Written informed consent was obtained.    FINDINGS:  A 10 week size uterus.  Diffuse proliferative endometrium.  The anterior uterus had abnormal tissues which appeared to be multiple thickened small polyps. It is concerning and based on this I DID NOT proceed with the ablation. Normal ostia bilaterally. Endometrial curettings sent to pathology.   PROCEDURE DETAILS:  The patient was taken to the operating room where general anesthesia was administered with LMA and was found to be adequate.  After an adequate timeout was performed, she was placed in the dorsal lithotomy position and examined; then prepped  and draped in the sterile manner.   Her bladder was catheterized for an unmeasured amount of clear, yellow urine. A speculum was then placed in the patient's vagina and a single tooth tenaculum was applied to the anterior lip of the cervix.  A paracervical nerve block was performed using 0.5% Marcaine. The cervix was sounded to 8cm and dilated manually with metal dilators to accommodate the 5 mm diagnostic hysteroscope.  Once the cervix was dilated, the hysteroscope was inserted under direct visualization using 1.5% glycine as a suspension medium. Initially a false track ws inadvertently created but, once the ostia were not noted the hysteroscope was removed and reinserted.  The uterine cavity was carefully examined, both ostia were recognized, and moderate diffusely  proliferative endometrium was noted.   After further careful visualization of the uterine cavity, the hysteroscope was removed under direct visualization.  A sharp curettage was then performed to obtain a moderate amount of endometrial curettings.  The hysteroscope was reinserted and the cavity was found to be intact. There were still foci of the above mentioned abnormal areas. This was concerning for malignancy and an ablation was not completed. The tenaculum was removed from the anterior lip of the cervix and the vaginal speculum was removed after noting good hemostasis.  The patient tolerated the procedure well and was taken to the recovery area awake, extubated and in stable condition.  The patient will be discharged to home as per PACU criteria.  Routine postoperative instructions given.   She will follow up in the clinic in 2 weeks for postoperative evaluation.  Larri Brewton L. Harraway-Smith, M.D., [name]

## 2020-09-15 NOTE — Anesthesia Postprocedure Evaluation (Signed)
Anesthesia Post Note  Patient: Jane [name]  Procedure(s) Performed: DILATATION & CURETTAGE/HYSTEROSCOPY (N/A Vagina )     Patient location during evaluation: PACU Anesthesia Type: General Level of consciousness: awake and sedated Pain management: pain level controlled Vital Signs Assessment: post-procedure vital signs reviewed and stable Respiratory status: spontaneous breathing Cardiovascular status: stable Postop Assessment: no apparent nausea or vomiting Anesthetic complications: no   No complications documented.  Last Vitals:  Vitals:   09/15/20 1354 09/15/20 1407  BP: (!) 168/84   Pulse: 90 90  Resp: 14 10  Temp:  36.8 C  SpO2: 100% 100%    Last Pain:  Vitals:   09/15/20 1407  TempSrc: Oral  PainSc:                  [name]

## 2020-09-15 NOTE — Anesthesia Preprocedure Evaluation (Signed)
Anesthesia Evaluation  Patient identified by MRN, date of birth, ID band Patient awake    Reviewed: Allergy & Precautions, NPO status , Patient's Chart, lab work & pertinent test results  Airway Mallampati: I       Dental no notable dental hx.    Pulmonary asthma , former smoker,    Pulmonary exam normal        Cardiovascular hypertension, Pt. on medications and Pt. on home beta blockers + CAD, + Past MI and + Cardiac Stents  Normal cardiovascular exam     Neuro/Psych  Headaches, PSYCHIATRIC DISORDERS Depression  Neuromuscular disease    GI/Hepatic negative GI ROS,   Endo/Other  diabetes, Type 2, Insulin Dependent  Renal/GU ESRF and DialysisRenal disease  negative genitourinary   Musculoskeletal   Abdominal (+) + obese,   Peds  Hematology  (+) anemia ,   Anesthesia Other Findings   Reproductive/Obstetrics                             Anesthesia Physical Anesthesia Plan  ASA: III  Anesthesia Plan: General   Post-op Pain Management:    Induction:   PONV Risk Score and Plan:   Airway Management Planned:   Additional Equipment: None  Intra-op Plan:   Post-operative Plan:   Informed Consent: I have reviewed the patients History and Physical, chart, labs and discussed the procedure including the risks, benefits and alternatives for the proposed anesthesia with the patient or authorized representative who has indicated his/her understanding and acceptance.     Dental advisory given  Plan Discussed with: CRNA  Anesthesia Plan Comments:         Anesthesia Quick Evaluation

## 2020-09-15 NOTE — Brief Op Note (Signed)
09/15/2020  2:19 PM  PATIENT:  Jane [name]  44 y.o. female  PRE-OPERATIVE DIAGNOSIS:  AUB  POST-OPERATIVE DIAGNOSIS:  AUB  PROCEDURE:  Procedure(s): DILATATION & CURETTAGE/HYSTEROSCOPY (N/A)  SURGEON:  Surgeon(s) and Role:    * Lavonia Drafts, MD - Primary  ANESTHESIA:   general and paracervical block  EBL:  Minimal    BLOOD ADMINISTERED:none  DRAINS: none   LOCAL MEDICATIONS USED:  MARCAINE     SPECIMEN:  Source of Specimen:  endometrial currettings.   DISPOSITION OF SPECIMEN:  PATHOLOGY  COUNTS:  YES  TOURNIQUET:  * No tourniquets in log *  DICTATION: .Note written in [place]: Discharge to home after PACU  PATIENT DISPOSITION:  PACU - hemodynamically stable.   Delay start of Pharmacological VTE agent (>24hrs) due to surgical blood loss or risk of bleeding: not applicable  Complications: none immediate.   Shanaiya Bene L. Harraway-Smith, M.D., [name]

## 2020-09-15 NOTE — Transfer of Care (Signed)
Immediate Anesthesia Transfer of Care Note  Patient: Jane [name]  Procedure(s) Performed: DILATATION & CURETTAGE/HYSTEROSCOPY (N/A Vagina )  Patient Location: PACU  Anesthesia Type:General  Level of Consciousness: awake, alert  and oriented  Airway & Oxygen Therapy: Patient Spontanous Breathing and Patient connected to nasal cannula oxygen  Post-op Assessment: Report given to RN and Post -op Vital signs reviewed and stable  Post vital signs: Reviewed and stable  Last Vitals:  Vitals Value Taken Time  BP 168/84 09/15/20 1355  Temp    Pulse 88 09/15/20 1356  Resp 11 09/15/20 1356  SpO2 100 % 09/15/20 1356  Vitals shown include unvalidated device data.  Last Pain:  Vitals:   09/15/20 1245  TempSrc: Oral  PainSc: 3       Patients Stated Pain Goal: 3 (56/15/37 9432)  Complications: No complications documented.

## 2020-09-15 NOTE — Discharge Instructions (Signed)
Post Anesthesia Home Care Instructions  Activity: Get plenty of rest for the remainder of the day. A responsible adult should stay with you for 24 hours following the procedure.  For the next 24 hours, DO NOT: -Drive a car -Paediatric nurse -Drink alcoholic beverages -Take any medication unless instructed by your physician -Make any legal decisions or sign important papers.  Meals: Start with liquid foods such as gelatin or soup. Progress to regular foods as tolerated. Avoid greasy, spicy, heavy foods. If nausea and/or vomiting occur, drink only clear liquids until the nausea and/or vomiting subsides. Call your physician if vomiting continues.  Special Instructions/Symptoms: Your throat may feel dry or sore from the anesthesia or the breathing tube placed in your throat during surgery. If this causes discomfort, gargle with warm salt water. The discomfort should disappear within 24 hours.  If you had a scopolamine patch placed behind your ear for the management of post- operative nausea and/or vomiting:  1. The medication in the patch is effective for 72 hours, after which it should be removed.  Wrap patch in a tissue and discard in the trash. Wash hands thoroughly with soap and water. 2. You may remove the patch earlier than 72 hours if you experience unpleasant side effects which may include dry mouth, dizziness or visual disturbances. 3. Avoid touching the patch. Wash your hands with soap and water after contact with the patch.   Hysteroscopy, Care After This sheet gives you information about how to care for yourself after your procedure. Your health care provider may also give you more specific instructions. If you have problems or questions, contact your health care provider. What can I expect after the procedure? After the procedure, it is common to have:  Cramping.  Bleeding. This can vary from light spotting to menstrual-like bleeding. Follow these instructions at  home: Activity  Rest for 1-2 days after the procedure.  Do not douche, use tampons, or have sex for 2 weeks after the procedure, or until your health care provider approves.  Do not drive for 24 hours after the procedure, or for as long as told by your health care provider.  Do not drive, use heavy machinery, or drink alcohol while taking prescription pain medicines. Medicines   Take over-the-counter and prescription medicines only as told by your health care provider.  Do not take aspirin during recovery. It can increase the risk of bleeding. General instructions  Do not take baths, swim, or use a hot tub until your health care provider approves. Take showers instead of baths for 2 weeks, or for as long as told by your health care provider.  To prevent or treat constipation while you are taking prescription pain medicine, your health care provider may recommend that you: ? Drink enough fluid to keep your urine clear or pale yellow. ? Take over-the-counter or prescription medicines. ? Eat foods that are high in fiber, such as fresh fruits and vegetables, whole grains, and beans. ? Limit foods that are high in fat and processed sugars, such as fried and sweet foods.  Keep all follow-up visits as told by your health care provider. This is important. Contact a health care provider if:  You feel dizzy or lightheaded.  You feel nauseous.  You have abnormal vaginal discharge.  You have a rash.  You have pain that does not get better with medicine.  You have chills. Get help right away if:  You have bleeding that is heavier than a normal menstrual period.  You have a fever.  You have pain or cramps that get worse.  You develop new abdominal pain.  You faint.  You have pain in your shoulders.  You have shortness of breath. Summary  After the procedure, you may have cramping and some vaginal bleeding.  Do not douche, use tampons, or have sex for 2 weeks after the  procedure, or until your health care provider approves.  Do not take baths, swim, or use a hot tub until your health care provider approves. Take showers instead of baths for 2 weeks, or for as long as told by your health care provider.  Report any unusual symptoms to your health care provider.  Keep all follow-up visits as told by your health care provider. This is important. This information is not intended to replace advice given to you by your health care provider. Make sure you discuss any questions you have with your health care provider. Document Revised: 10/13/2017 Document Reviewed: 11/29/2016 Elsevier Patient Education  [place].

## 2020-09-15 NOTE — Anesthesia Procedure Notes (Signed)
Procedure Name: LMA Insertion Date/Time: 09/15/2020 1:18 PM Performed by: Karem Farha D, CRNA Pre-anesthesia Checklist: Patient identified, Emergency Drugs available, Suction available and Patient being monitored Patient Re-evaluated:Patient Re-evaluated prior to induction Oxygen Delivery Method: Circle system utilized Preoxygenation: Pre-oxygenation with 100% oxygen Induction Type: IV induction Ventilation: Mask ventilation without difficulty LMA: LMA inserted LMA Size: 4.0 Tube type: Oral Number of attempts: 1 Placement Confirmation: positive ETCO2 and breath sounds checked- equal and bilateral Tube secured with: Tape Dental Injury: Teeth and Oropharynx as per pre-operative assessment

## 2020-09-15 NOTE — H&P (Signed)
Preoperative History and Physical  Jane [name] is a 44 y.o. [id number] here for surgical management of AUB.   Proposed surgery: hysteroscopy with dilation and curettage and endometrial ablation using Novasure.   Past Medical History:  Diagnosis Date  . Abnormal uterine bleeding (AUB)   . Asthma    prn inhaler  . CAD S/P BMS PCI to prox LAD cardiologist--- dr Beverlyn Roux LAD to Mid LAD lesion, 90% stenosed. Post intervention - Vision BMS 3.0 mm x 18 mm (~3.5 mm) there is a 0% residual stenosis. ;   nuclear stress test-- 09-13-2018 low risk with no ischemia, nuclear ef 56%  . Charcot foot due to diabetes mellitus ([place]) 11/2016   left 2018; right 2019  . Depression   . Diabetic neuropathy (HCC)    feet  . Diabetic retinopathy, nonproliferative, severe ([place])    bilateral  . ESRD (end stage renal disease) on dialysis [facility])    ?chronic interstitial nephritis  [facility]  . H/O non-ST elevation myocardial infarction (NSTEMI) 03/2016   Found in 90% mid LAD lesion treated with bare-metal stent (BMS) PCI - vision BMS 3.0 mm x 18 mm  . History of MRSA infection 2007  . Hyperlipidemia   . Hypertension   . IDA (iron deficiency anemia)   . Insulin dependent type 2 diabetes mellitus ([place])   . Iron deficiency anemia    takes iron supplement  . PAOD (peripheral arterial occlusive disease) ([place])    vascular--- dr Bridgett Larsson  . S/P arteriovenous (AV) fistula creation 07/2017  . S/p bare metal coronary artery stent 03/31/2016   BMS x1  to pLAD  . Sickle cell trait [facility])    Past Surgical History:  Procedure Laterality Date  . ACHILLES TENDON LENGTHENING Left 11/24/2016   Procedure: Left Achilles tendon lengthening (open);  Surgeon: Wylene Simmer, MD;  Location: [place];  Service: Orthopedics;  Laterality: Left;  . AV FISTULA PLACEMENT Right 07/31/2017   Procedure: ARTERIOVENOUS BRACHIOCEPHALIC FISTULA CREATION RIGHT ARM;  Surgeon: Conrad [place], MD;  Location: [place];  Service: Vascular;  Laterality: Right;  . CALCANEAL OSTEOTOMY Left 11/24/2016   Procedure: Left hindfoot osteotomy and fusion;  Surgeon: Wylene Simmer, MD;  Location: [place];  Service: Orthopedics;  Laterality: Left;  . CARDIAC CATHETERIZATION N/A 03/31/2016   Procedure: Left Heart Cath and Coronary Angiography;  Surgeon: Lorretta Harp, MD;  Location: [name] INVASIVE CV LAB: 90% early mLAD, normal LV Fxn  . CARDIAC CATHETERIZATION N/A 03/31/2016   Procedure: Coronary Stent Intervention;  Surgeon: Lorretta Harp, MD;  Location: [place] CV LAB;  Service: Cardiovascular: PCI to mLAD BMS Vision 3.0 mm x 18 mm  . CESAREAN SECTION  1997  . FISTULA SUPERFICIALIZATION Right 11/15/2017   Procedure: FISTULA SUPERFICIALIZATION RIGHT BRACHIOCEPHALIC  RIGHT;  Surgeon: Conrad [place], MD;  Location: [place];  Service: Vascular;  Laterality: Right;  . FOOT SURGERY     multiple for [place]  . TUBAL LIGATION  1997   OB History    Gravida  2   Para  2   Term  1   Preterm  1   AB  0   Living  2     SAB  0   TAB  0   Ectopic  0   Multiple  0   Live Births             Patient denies any cervical dysplasia or STIs. Medications  Prior to Admission  Medication Sig Dispense Refill Last Dose  . acetaminophen (TYLENOL) 500 MG tablet Take 1,000 mg by mouth every 6 (six) hours as needed (for pain.).   Past Week at Unknown time  . albuterol (PROVENTIL HFA) 108 (90 Base) MCG/ACT inhaler INHALE 2 PUFFS BY MOUTH EVERY 4 HOURS AS NEEDED FOR COUGHING, WHEEZING, OR SHORTNESS OF BREATH 20.1 g 2 Past Month at Unknown time  . amLODipine (NORVASC) 5 MG tablet Take 5 mg by mouth at bedtime.   09/14/2020 at Unknown time  . aspirin EC 81 MG tablet Take 1 tablet (81 mg total) by mouth daily. 90 tablet 3 09/14/2020 at Unknown time  . cyclobenzaprine (FLEXERIL) 5 MG tablet Take 1 tablet (5 mg total) by mouth 3 (three) times daily as needed for muscle spasms. 21 tablet 0 09/14/2020 at Unknown time   . gabapentin (NEURONTIN) 100 MG capsule Take 100 mg by mouth at bedtime.   09/14/2020 at Unknown time  . insulin glargine (LANTUS) 100 UNIT/ML injection Inject 0.2 mLs (20 Units total) into the skin at bedtime. 30 mL 3 09/14/2020 at Unknown time  . insulin lispro (HUMALOG KWIKPEN) 100 UNIT/ML KwikPen Inject 4-9 Units into the skin 3 (three) times daily. Use 4-9U three times daily based on your carb counts and correction scale for a maximum of 24U daily 15 mL 11 09/14/2020 at Unknown time  . megestrol (MEGACE) 40 MG tablet Take 2 tablets (80 mg total) by mouth 2 (two) times daily. 120 tablet 8 Past Week at Unknown time  . metoprolol tartrate (LOPRESSOR) 25 MG tablet Take 1 tablet (25 mg total) by mouth 2 (two) times daily. Do not take the night before dialysis, and take 2 hours after dialysis. 180 tablet 3 09/14/2020 at 1300  . multivitamin (RENA-VIT) TABS tablet Take 1 tablet by mouth daily.  3 09/14/2020 at Unknown time  . PRESCRIPTION MEDICATION at bedtime.   09/14/2020 at Unknown time  . RENAGEL 800 MG tablet Take 4,800 mg by mouth See admin instructions. Take 4800 mg tablets three times daily with meals  11 09/14/2020 at Unknown time  . rosuvastatin (CRESTOR) 40 MG tablet Take 1 tablet (40 mg total) by mouth daily. (Patient taking differently: Take 40 mg by mouth at bedtime. ) 90 tablet 3   . ACCU-CHEK FASTCLIX LANCETS MISC Check blood sugar 4x/day 204 each 5 Unknown at Unknown time  . Blood Glucose Monitoring Suppl (ACCU-CHEK GUIDE) w/Device KIT 1 each by Does not apply route 3 (three) times daily. 1 kit 0 Unknown at Unknown time  . buPROPion (WELLBUTRIN SR) 100 MG 12 hr tablet Take 1 tablet (100 mg total) by mouth 2 (two) times daily. 60 tablet 1 Unknown at Unknown time  . glucose blood (ACCU-CHEK GUIDE) test strip Check blood sugar up to 5 times a day 450 each 3   . icosapent Ethyl (VASCEPA) 1 g capsule Take 2 capsules (2 g total) by mouth 2 (two) times daily. (Patient not taking: Reported on  09/08/2020) 120 capsule 11 Unknown at Unknown time  . Insulin Pen Needle 32G X 4 MM MISC Use to inject insulin 3 times a day 300 each 3 Unknown at Unknown time  . Insulin Syringe-Needle U-100 31G X 15/64" 0.3 ML MISC Use to inject insulin daily 100 each 3 Unknown at Unknown time  . sertraline (ZOLOFT) 100 MG tablet Take 100 mg by mouth daily.   Unknown at Unknown time    Allergies  Allergen Reactions  . Adhesive [  Tape] Other (See Comments)    Irritation    Social History:   reports that she quit smoking about 5 years ago. Her smoking use included cigarettes. She smoked 0.00 packs per day for 0.50 years. She has never used smokeless tobacco. She reports current alcohol use. She reports current drug use. Drug: Marijuana. Family History  Problem Relation Age of Onset  . Stroke Mother   . Diabetes Mother   . Hypertension Mother   . Aneurysm Mother   . Diabetes Father   . Hypertension Father   . Diabetes Sister   . Hypertension Sister   . Diabetes Maternal Grandmother   . Diabetes Maternal Grandfather   . Cancer Paternal Grandfather        Prostate    Review of Systems: Noncontributory  PHYSICAL EXAM: Height 5' 5" (1.651 m), weight 99.8 kg. General appearance - alert, well appearing, and in no distress Chest - clear to auscultation, no wheezes, rales or rhonchi, symmetric air entry Heart - normal rate and regular rhythm Abdomen - soft, nontender, nondistended, no masses or organomegaly Pelvic - examination not indicated Extremities - peripheral pulses normal, no pedal edema, no clubbing or cyanosis  Labs: Results for orders placed or performed during the hospital encounter of 09/15/20 (from the past 336 hour(s))  Pregnancy, urine POC   Collection Time: 09/15/20 12:18 PM  Result Value Ref Range   Preg Test, Ur NEGATIVE NEGATIVE  Glucose, capillary   Collection Time: 09/15/20 12:20 PM  Result Value Ref Range   Glucose-Capillary 206 (H) 70 - 99 mg/dL   Comment 1 Notify RN    Results for orders placed or performed during the hospital encounter of 09/11/20 (from the past 336 hour(s))  SARS CORONAVIRUS 2 (TAT 6-24 HRS) Nasopharyngeal Nasopharyngeal Swab   Collection Time: 09/11/20  9:10 AM   Specimen: Nasopharyngeal Swab  Result Value Ref Range   SARS Coronavirus 2 NEGATIVE NEGATIVE    Imaging Studies: 04/15/2020 CLINICAL DATA:  Initial evaluation for abnormal uterine bleeding.  EXAM: ULTRASOUND PELVIS TRANSVAGINAL  TECHNIQUE: Transvaginal ultrasound examination of the pelvis was performed including evaluation of the uterus, ovaries, adnexal regions, and pelvic cul-de-sac.  COMPARISON:  Prior ultrasound from 11/29/2018.  FINDINGS: Uterus  Measurements: 8.9 x 5.2 x 4.8 cm = volume: 115 mL. Uterus demonstrates a heterogeneous echotexture without discrete fibroid or other mass.  Endometrium  Thickness: 15 mm. Endometrial complex demonstrates a mildly heterogeneous echotexture without focal abnormality. Small amount of mildly complex fluid noted within the endometrial canal at the level of the lower uterine segment/cervix.  Right ovary  Measurements: 2.8 x 1.3 x 2.1 cm = volume: 3.8 mL. Normal appearance/no adnexal mass.  Left ovary  Measurements: 3.7 x 1.6 x 2.1 cm = volume: 6.6 mL. Normal appearance/no adnexal mass.  Other findings:  Trace free fluid noted within the pelvis.  IMPRESSION: 1. Endometrial stripe measures up to 15 mm in thickness. If bleeding remains unresponsive to hormonal or medical therapy, sonohysterogram should be considered for focal lesion work-up. (Ref: Radiological Reasoning: Algorithmic Workup of Abnormal Vaginal Bleeding with Endovaginal Sonography and Sonohysterography. AJR 2008; 875:I43-32). 2. Small amount of mildly complex fluid within the endometrial cavity, likely blood products given history of abnormal uterine bleeding. 3. Otherwise negative pelvic ultrasound.  Assessment: Patient Active  Problem List   Diagnosis Date Noted  . Other disorders of phosphorus metabolism 02/05/2020  . Left lateral abdominal pain 02/02/2020  . Acute cystitis with hematuria 02/02/2020  . Headache, unspecified 12/04/2019  .  Unspecified protein-calorie malnutrition ([place]) 09/20/2019  . Allergy, unspecified, initial encounter 09/02/2019  . Ulcer of left foot due to type 2 diabetes mellitus ([place]) 06/03/2019  . Vaginal discharge 05/07/2019  . Diabetic foot ulcer ([place]) 04/22/2019  . Arthritis of left knee 02/11/2019  . Nausea 11/30/2018  . Encounter for removal of sutures 11/23/2018  . Diarrhea, unspecified 10/13/2018  . Cellulitis, unspecified 05/26/2018  . 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 Patellar tendonitis of left knee 04/04/2018  . Other fluid overload 03/31/2018  . Shortness of breath 02/26/2018  . Hidradenitis suppurativa 02/12/2018  . Anemia in chronic kidney disease 02/12/2018  . Secondary hyperparathyroidism of renal origin ([place]) 01/30/2018  . Encounter for immunization 01/19/2018  . Coagulation defect, unspecified ([place]) 01/18/2018  . Fever, unspecified 01/10/2018  . Pain, unspecified 01/10/2018  . Type 2 diabetes mellitus with other diabetic kidney complication ([place]) 62/[id number]/7654  . Encounter for screening for respiratory tuberculosis 01/10/2018  . Major depressive disorder 08/11/2017  . Sinus tachycardia 02/23/2017  . ESRD (end stage renal disease) on dialysis ([place]) 01/05/2017  . Vitreous floater, left 11/16/2016  . CAD S/P BMS PCI to prox LAD 03/31/2016  . Atypical angina ([place]) 03/29/2016  . Dysfunctional uterine bleeding 03/17/2016  . PAOD (peripheral arterial occlusive disease) ([place]) 01/08/2016  . Charcot foot due to diabetes mellitus ([place]) 09/07/2015  . Peripheral neuropathy 09/20/2014  . Essential hypertension 08/16/2014  . Seasonal allergic rhinitis 08/16/2014  . URI (upper respiratory infection) 08/05/2014  . Iron deficiency  anemia 10/25/2013  . Morbid obesity ([place]) 09/26/2012  . Hyperlipidemia associated with type 2 diabetes mellitus ([place]) 02/26/2009  . 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, with long-term current use of insulin ([place]) 05/08/2007  . Asthma 05/08/2007    Plan: Patient will undergo surgical management with hysteroscopy with dilation and curettage and endometrial ablation using Novasure.  The risks of surgery were discussed in detail with the patient including but not limited to: bleeding which may require transfusion or reoperation; infection which may require antibiotics; injury to surrounding organs which may involve bowel, bladder, ureters ; need for additional procedures including laparoscopy or laparotomy; thromboembolic phenomenon, surgical site problems and other postoperative/anesthesia complications. Likelihood of success in alleviating the patient's condition was discussed. Routine postoperative instructions will be reviewed with the patient and her family in detail after surgery.  The patient concurred with the proposed plan, giving informed written consent for the surgery.  Patient has been NPO since last night she will remain NPO for procedure.  Anesthesia and OR aware.  Preoperative prophylactic antibiotics and SCDs ordered on call to the OR.  To OR when ready.  Carolyn L. Ihor Dow, M.D., [facility] 09/15/2020 12:44 PM

## 2020-09-16 ENCOUNTER — Encounter (HOSPITAL_BASED_OUTPATIENT_CLINIC_OR_DEPARTMENT_OTHER): Payer: Self-pay | Admitting: Obstetrics & Gynecology

## 2020-09-16 ENCOUNTER — Telehealth: Payer: Self-pay | Admitting: *Deleted

## 2020-09-16 ENCOUNTER — Telehealth: Payer: Medicare Other

## 2020-09-16 LAB — SURGICAL PATHOLOGY

## 2020-09-16 NOTE — Telephone Encounter (Signed)
  Chronic Care Management   Outreach Note  09/16/2020 Name: Jane [name] MRN: [id number] DOB: [birth year]  Referred by: [name], MD Reason for referral : Chronic Care Management (CAD, ESRD, IDDM, HLD, obesity, depression)   An unsuccessful telephone outreach was attempted today. Purpose of call was to assess patient's recovery from outpatient gynecological surgery on 09/15/20.The patient was referred to the case management team for assistance with care management and care coordination.   Follow Up Plan: A HIPAA compliant phone message was left for the patient providing contact information and requesting a return call.  The care management team will reach out to the patient again over the next 1 days.   Kelli Churn RN, CCM, [facility] RN Care Manager [phone]

## 2020-09-17 ENCOUNTER — Other Ambulatory Visit: Payer: Self-pay | Admitting: Obstetrics & Gynecology

## 2020-09-17 ENCOUNTER — Telehealth: Payer: Self-pay

## 2020-09-17 ENCOUNTER — Ambulatory Visit: Payer: Medicare Other | Admitting: *Deleted

## 2020-09-17 DIAGNOSIS — N186 End stage renal disease: Secondary | ICD-10-CM

## 2020-09-17 DIAGNOSIS — I10 Essential (primary) hypertension: Secondary | ICD-10-CM

## 2020-09-17 DIAGNOSIS — E785 Hyperlipidemia, unspecified: Secondary | ICD-10-CM

## 2020-09-17 DIAGNOSIS — E113493 Type 2 diabetes mellitus with severe nonproliferative diabetic retinopathy without macular edema, bilateral: Secondary | ICD-10-CM

## 2020-09-17 DIAGNOSIS — E1169 Type 2 diabetes mellitus with other specified complication: Secondary | ICD-10-CM

## 2020-09-17 DIAGNOSIS — Z794 Long term (current) use of insulin: Secondary | ICD-10-CM

## 2020-09-17 DIAGNOSIS — Z992 Dependence on renal dialysis: Secondary | ICD-10-CM

## 2020-09-17 DIAGNOSIS — E083413 Diabetes mellitus due to underlying condition with severe nonproliferative diabetic retinopathy with macular edema, bilateral: Secondary | ICD-10-CM

## 2020-09-17 MED ORDER — TRAMADOL HCL 50 MG PO TABS
50.0000 mg | ORAL_TABLET | Freq: Four times a day (QID) | ORAL | 0 refills | Status: DC | PRN
Start: 2020-09-17 — End: 2020-09-17

## 2020-09-17 MED FILL — traMADol HCL 50 MG TABS: 50 | 3 days supply | Qty: 20 | Fill #0

## 2020-09-17 NOTE — Telephone Encounter (Signed)
Patient had surgery on Tuesday [facility] and hysteroscopy) and states that she has increasing pain when sitting. Tylenol 1000 mg not helping the pain.  Mild pain in flank area. Pain level 7/10  Patient able to drink fluids without difficulties. Will route to provider for treatment plan. Kathrene Alu RN

## 2020-09-17 NOTE — Telephone Encounter (Signed)
Patient called and scheduled for post op appointment on Monday with Dr. Ihor Dow.   Per Ihor Dow she needs to have Tramadol #20 with no refills.  Kathrene Alu RN

## 2020-09-17 NOTE — Progress Notes (Signed)
Internal Medicine Clinic Resident  I have personally reviewed this encounter including the documentation in this note and/or discussed this patient with the care management provider. I will address any urgent items identified by the care management provider and will communicate my actions to the patient's PCP. I have reviewed the patient's CCM visit with my supervising attending, Dr Hoffman.  Marylene Masek N Teriana Danker, DO 09/17/2020   

## 2020-09-17 NOTE — Chronic Care Management (AMB) (Signed)
Chronic Care Management   Follow Up Note   09/17/2020 Name: Jane [name] MRN: [id number] DOB: September 26, 1976  Referred by: [name], MD Reason for referral : Chronic Care Management (CAD, ESRD, IDDM, HLD, obesity, depression)   Jane [name] is a 44 y.o. year old female who is a primary care patient of [name], MD. The CCM team was consulted for assistance with chronic disease management and care coordination needs.    Review of patient status, including review of consultants reports, relevant laboratory and other test results, and collaboration with appropriate care team members and the patient's provider was performed as part of comprehensive patient evaluation and provision of chronic care management services.    SDOH (Social Determinants of Health) assessments performed: No See Care Plan activities for detailed interventions related to [facility])     Outpatient Encounter Medications as of 09/17/2020  Medication Sig  . ACCU-CHEK FASTCLIX LANCETS MISC Check blood sugar 4x/day  . acetaminophen (TYLENOL) 500 MG tablet Take 1,000 mg by mouth every 6 (six) hours as needed (for pain.).  Marland Kitchen albuterol (PROVENTIL HFA) 108 (90 Base) MCG/ACT inhaler INHALE 2 PUFFS BY MOUTH EVERY 4 HOURS AS NEEDED FOR COUGHING, WHEEZING, OR SHORTNESS OF BREATH  . amLODipine (NORVASC) 5 MG tablet Take 5 mg by mouth at bedtime.  Marland Kitchen aspirin EC 81 MG tablet Take 1 tablet (81 mg total) by mouth daily.  . Blood Glucose Monitoring Suppl (ACCU-CHEK GUIDE) w/Device KIT 1 each by Does not apply route 3 (three) times daily.  Marland Kitchen buPROPion (WELLBUTRIN SR) 100 MG 12 hr tablet Take 1 tablet (100 mg total) by mouth 2 (two) times daily.  . cyclobenzaprine (FLEXERIL) 5 MG tablet Take 1 tablet (5 mg total) by mouth 3 (three) times daily as needed for muscle spasms.  Marland Kitchen gabapentin (NEURONTIN) 100 MG capsule Take 100 mg by mouth at bedtime.  Marland Kitchen glucose blood (ACCU-CHEK GUIDE) test strip Check blood sugar up to 5 times a  day  . icosapent Ethyl (VASCEPA) 1 g capsule Take 2 capsules (2 g total) by mouth 2 (two) times daily. (Patient not taking: Reported on 09/08/2020)  . insulin glargine (LANTUS) 100 UNIT/ML injection Inject 0.2 mLs (20 Units total) into the skin at bedtime.  . insulin lispro (HUMALOG KWIKPEN) 100 UNIT/ML KwikPen Inject 4-9 Units into the skin 3 (three) times daily. Use 4-9U three times daily based on your carb counts and correction scale for a maximum of 24U daily  . Insulin Pen Needle 32G X 4 MM MISC Use to inject insulin 3 times a day  . Insulin Syringe-Needle U-100 31G X 15/64" 0.3 ML MISC Use to inject insulin daily  . megestrol (MEGACE) 40 MG tablet Take 2 tablets (80 mg total) by mouth 2 (two) times daily.  . metoprolol tartrate (LOPRESSOR) 25 MG tablet Take 1 tablet (25 mg total) by mouth 2 (two) times daily. Do not take the night before dialysis, and take 2 hours after dialysis.  Marland Kitchen multivitamin (RENA-VIT) TABS tablet Take 1 tablet by mouth daily.  Marland Kitchen PRESCRIPTION MEDICATION at bedtime.  Marland Kitchen RENAGEL 800 MG tablet Take 4,800 mg by mouth See admin instructions. Take 4800 mg tablets three times daily with meals  . rosuvastatin (CRESTOR) 40 MG tablet Take 1 tablet (40 mg total) by mouth daily. (Patient taking differently: Take 40 mg by mouth at bedtime. )  . sertraline (ZOLOFT) 100 MG tablet Take 100 mg by mouth daily.   No facility-administered encounter medications on file as of 09/17/2020.  Objective:  BP Readings from Last 3 Encounters:  09/15/20 (!) 187/99  08/12/20 125/66  06/22/20 (!) 158/88   Wt Readings from Last 3 Encounters:  09/15/20 218 lb 4.8 oz (99 kg)  08/12/20 219 lb (99.3 kg)  06/22/20 230 lb (104.3 kg)   Lab Results  Component Value Date   CHOL 186 10/15/2019   HDL 44 10/15/2019   LDLCALC 100 (H) 10/15/2019   TRIG 249 (H) 10/15/2019   CHOLHDL 4.2 10/15/2019    Goals Addressed              This Visit's Progress     Patient Stated   .  "I'm very  uncomfortable since I had my surgery yesterday and sitting in this dialysis chair isn't helping." (pt-stated)        CARE PLAN ENTRY (see longitudinal plan of care for additional care plan information)  Current Barriers:  Marland Kitchen Knowledge Deficits related to managing acute pain- successful outreach to patient via phone to assess her recovery from outpatient gynecological surgery (dilatation and curettage and hysteroscopy) yesterday to investigate abnormal uterine bleeding , she says she was told to take over the counter tylenol but this is not managing the pain and the pain is exacerbated by the need to sit in a recliner chair today for 3-4 hours her dialysis treatment   Nurse Case Manager Clinical Goal(s):  Marland Kitchen Over the next 1-7 days, patient will verbalize understanding of plan for managing pain  Interventions:  . Evaluation of current treatment plan related to post operative surgery and patient's adherence to plan as established by provider. . Advised patient to call surgeon's office and report unrelieved pain and request something stronger than over the counter Tylenol and to make follow up appointment to discuss surgical findings . Advised patient this CCM RN will call her next week to complete follow up chronic care assessment and to assess her ongoing recovery from surgery  Patient Self Care Activities:  . Patient verbalizes understanding of plan to call surgeon to address her post operative pain management and to make follow up appointment   Initial goal documentation         Plan:   The care management team will reach out to the patient again over the next 7-10 days.   Kelli Churn RN, CCM, [facility] RN Care Manager [phone]

## 2020-09-21 ENCOUNTER — Ambulatory Visit (INDEPENDENT_AMBULATORY_CARE_PROVIDER_SITE_OTHER): Payer: Medicare Other | Admitting: Obstetrics & Gynecology

## 2020-09-21 ENCOUNTER — Encounter: Payer: Self-pay | Admitting: Obstetrics & Gynecology

## 2020-09-21 ENCOUNTER — Other Ambulatory Visit: Payer: Self-pay

## 2020-09-21 VITALS — BP 156/70 | HR 90 | Wt 223.0 lb

## 2020-09-21 DIAGNOSIS — N939 Abnormal uterine and vaginal bleeding, unspecified: Secondary | ICD-10-CM

## 2020-09-21 DIAGNOSIS — K5900 Constipation, unspecified: Secondary | ICD-10-CM

## 2020-09-21 NOTE — Progress Notes (Signed)
Patient is six days post op D&C/hysteroscopy. Patient states she is feeling constipated and has some belly pain. Kathrene Alu RN

## 2020-09-21 NOTE — Progress Notes (Signed)
History:  44 y.o.LMP here today for 1 week post op check. Pt is s/p hysteroscopy with D&C.  Pt reports that she is doing well. She is eating well but, reports constipation. This was present prior to the surgery. She is not bleeding.        The following portions of the patient's history were reviewed and updated as appropriate: allergies, current medications, past family history, past medical history, past social history, past surgical history and problem list.  Review of Systems:  Pertinent items are noted in HPI.    Objective:  Physical Exam BP (!) 156/70   Pulse 90   Wt 223 lb (101.2 kg)   LMP  (LMP Unknown)   BMI 37.11 kg/m   CONSTITUTIONAL: Well-developed, well-nourished female in no acute distress.  HENT:  Normocephalic, atraumatic EYES: Conjunctivae and EOM are normal. No scleral icterus.  NECK: Normal range of motion SKIN: Skin is warm and dry. No rash noted. Not diaphoretic.No pallor. [place]: Alert and oriented to person, place, and time. Normal coordination.  Abd: Soft, nontender and nondistended; her port sites are healing well. .  Pelvic: deferred  Labs and Imaging Surg path 09/15/2020 ENDOMETRIUM, CURETTAGE:  - Benign inactive endometrium with hormone-effect  - Adenomyosis  - Negative for hyperplasia or malignancy    Assessment & Plan:  1 week post op check following  Pt is s/p hysteroscopy with D&C.  Doing well  Reviewed her surg path.   Reviewed post op instructions and activities  Gradual increase in activities  F/u in 3 months or sooner prn  Keep Megace 40mg  bid   Constipation:  Mg Citrate OTC  Followed by daily Miralax.   All questions answered.   Jameelah Watts L. Harraway-Smith, M.D., [name]

## 2020-09-23 ENCOUNTER — Ambulatory Visit: Payer: Medicare Other | Admitting: *Deleted

## 2020-09-23 DIAGNOSIS — I10 Essential (primary) hypertension: Secondary | ICD-10-CM

## 2020-09-23 DIAGNOSIS — E785 Hyperlipidemia, unspecified: Secondary | ICD-10-CM

## 2020-09-23 DIAGNOSIS — E083413 Diabetes mellitus due to underlying condition with severe nonproliferative diabetic retinopathy with macular edema, bilateral: Secondary | ICD-10-CM

## 2020-09-23 DIAGNOSIS — E1169 Type 2 diabetes mellitus with other specified complication: Secondary | ICD-10-CM

## 2020-09-23 DIAGNOSIS — Z992 Dependence on renal dialysis: Secondary | ICD-10-CM

## 2020-09-23 DIAGNOSIS — N186 End stage renal disease: Secondary | ICD-10-CM

## 2020-09-23 DIAGNOSIS — E113493 Type 2 diabetes mellitus with severe nonproliferative diabetic retinopathy without macular edema, bilateral: Secondary | ICD-10-CM

## 2020-09-23 DIAGNOSIS — Z794 Long term (current) use of insulin: Secondary | ICD-10-CM

## 2020-09-23 NOTE — Patient Instructions (Signed)
Visit Information It was nice speaking with you today. Goals Addressed              This Visit's Progress     Patient Stated     "I need to get my diabetes under better control but my motivation comes and goes" (pt-stated)        [place] (see longitudinal plan of care for additional care plan information)  Current Barriers:   Chronic Disease Management support, education, and care coordination needs related to CAD, HTN, HLD, DMII, and ESRD- patient states due to 4 deaths of friends, family, and boyfriend in the last few months her blood pressure has been very high at times at dialysis, she says it was better at her last dialysis session and only mildly elevated when she saw her surgeon in follow up on 11/8, she also says her blood sugars have been good when they are checked at dialysis, she says she is seeing her therapist weekly to help her deal with the grief from so many deaths in a short period of time.  She reports she is aware she still needs to have the lipid panel and liver panel that Dr [name] ordered during her 06/22/20 office visit with him drawn at dialysis.   Clinical Goal(s) related to CAD, HTN, HLD, DMII, and ESRD:  Over the next 30-60 days, patient will:   Work with the care management team to address educational, disease management, and care coordination needs   Begin or continue self health monitoring activities as directed today  Call provider office for new or worsened signs and symptoms New or worsened symptom related to DM, HTN, CAD, ESRD  Call care management team with questions or concerns  Verbalize basic understanding of patient centered plan of care established today  Interventions related to CAD, HTN, HLD, DMII, and ESRD:   Prior to interviewing patient, reviewed patient status, including review of recent office visit notes, consultants reports, relevant laboratory and other test results, and medications, completed.  Assessed ongoing recovery  from gynecological surgery on 09/15/20  Assessed patient's self management skills in regards to HTN, IDDM, HLD, CAD and ESRD  Evaluation of current treatment plan related to HTN and IDDM, HLD and ESRD and patient's adherence to plan as established by provider.  Reviewed medications with patient and assessed medication taking behavior;  if indicated, discussed importance of medication adherence   Positive reinforcement given to patient for her good chronic disease self management skills and for seeking additional counseling from her therapist to deal with her grief  Again encouraged her to complete labs of lipid profile and liver panel ordered by Dr [name] on 06/22/20 at dialysis  Encouraged patient to contact this CCM RN for any questions or concerns that may arise before next month's call  Patient Self Care Activities related to CAD, HTN, HLD, DMII, and ESRD:   Patient is unable to independently self-manage chronic health conditions  Please see past updates related to this goal by clicking on the "Past Updates" button in the selected goal        COMPLETED: "I'm very uncomfortable since I had my surgery yesterday and sitting in this dialysis chair isn't helping." (pt-stated)        CARE PLAN ENTRY (see longitudinal plan of care for additional care plan information)  Current Barriers:   Knowledge Deficits related to managing acute pain- successful outreach to patient via phone to assess her surgical pain and ongoing recovery from outpatient gynecological surgery (  dilatation and curettage and hysteroscopy) on 09/15/20  to investigate abnormal uterine bleeding , she says she now feels great, she called her surgeon's office on 11/4 and was prescribed Tramadol, she says some of the pain was due to constipation when has also resolved, she says she saw her surgeon in follow up on 11/8 and was told the pathology was benign, she says she may need further uterine surgery in 4 months, possibly a  hysterectomy  Nurse Case Manager Clinical Goal(s):   Over the next 1-7 days, patient will verbalize understanding of plan for managing pain  Interventions:   Evaluation of current treatment plan related to post operative surgery and patient's adherence to plan as established by provider.  Positive reinforcement given to patient for calling her surgeon to address her post operative pain  Patient Self Care Activities:   Patient verbalizes understanding of plan to call surgeon to address her post operative pain management and to make follow up appointment   Please see past updates related to this goal by clicking on the "Past Updates" button in the selected goal         The patient verbalized understanding of instructions provided today and declined a print copy of patient instruction materials.   The care management team will reach out to the patient again over the next 30-60 days.   Kelli Churn RN, CCM, [facility] RN Care Manager [phone]

## 2020-09-23 NOTE — Progress Notes (Signed)
Internal Medicine Clinic Resident  I have personally reviewed this encounter including the documentation in this note and/or discussed this patient with the care management provider. I will address any urgent items identified by the care management provider and will communicate my actions to the patient's PCP. I have reviewed the patient's CCM visit with my supervising attending, Dr Williams.  Emri Sample Y Patrica Mendell, MD 09/23/2020   

## 2020-09-23 NOTE — Chronic Care Management (AMB) (Signed)
Chronic Care Management   Follow Up Note   09/23/2020 Name: Jane [name] MRN: [id number] DOB: [birth year]  Referred by: [name], MD Reason for referral : Chronic Care Management (CAD, ESRD, IDDM, HLD, obesity, depression)   Jane [name] is a 44 y.o. year old female who is a primary care patient of [name], MD. The CCM team was consulted for assistance with chronic disease management and care coordination needs.    Review of patient status, including review of consultants reports, relevant laboratory and other test results, and collaboration with appropriate care team members and the patient's provider was performed as part of comprehensive patient evaluation and provision of chronic care management services.    SDOH (Social Determinants of Health) assessments performed: No See Care Plan activities for detailed interventions related to [facility])     Outpatient Encounter Medications as of 09/23/2020  Medication Sig  . ACCU-CHEK FASTCLIX LANCETS MISC Check blood sugar 4x/day  . acetaminophen (TYLENOL) 500 MG tablet Take 1,000 mg by mouth every 6 (six) hours as needed (for pain.).  Marland Kitchen albuterol (PROVENTIL HFA) 108 (90 Base) MCG/ACT inhaler INHALE 2 PUFFS BY MOUTH EVERY 4 HOURS AS NEEDED FOR COUGHING, WHEEZING, OR SHORTNESS OF BREATH  . amLODipine (NORVASC) 5 MG tablet Take 5 mg by mouth at bedtime.  Marland Kitchen aspirin EC 81 MG tablet Take 1 tablet (81 mg total) by mouth daily.  . Blood Glucose Monitoring Suppl (ACCU-CHEK GUIDE) w/Device KIT 1 each by Does not apply route 3 (three) times daily.  Marland Kitchen buPROPion (WELLBUTRIN SR) 100 MG 12 hr tablet Take 1 tablet (100 mg total) by mouth 2 (two) times daily.  . cyclobenzaprine (FLEXERIL) 5 MG tablet Take 1 tablet (5 mg total) by mouth 3 (three) times daily as needed for muscle spasms.  Marland Kitchen gabapentin (NEURONTIN) 100 MG capsule Take 100 mg by mouth at bedtime.  Marland Kitchen glucose blood (ACCU-CHEK GUIDE) test strip Check blood sugar up to 5 times  a day  . icosapent Ethyl (VASCEPA) 1 g capsule Take 2 capsules (2 g total) by mouth 2 (two) times daily.  . insulin glargine (LANTUS) 100 UNIT/ML injection Inject 0.2 mLs (20 Units total) into the skin at bedtime.  . insulin lispro (HUMALOG KWIKPEN) 100 UNIT/ML KwikPen Inject 4-9 Units into the skin 3 (three) times daily. Use 4-9U three times daily based on your carb counts and correction scale for a maximum of 24U daily  . Insulin Pen Needle 32G X 4 MM MISC Use to inject insulin 3 times a day  . Insulin Syringe-Needle U-100 31G X 15/64" 0.3 ML MISC Use to inject insulin daily  . megestrol (MEGACE) 40 MG tablet Take 2 tablets (80 mg total) by mouth 2 (two) times daily.  . metoprolol tartrate (LOPRESSOR) 25 MG tablet Take 1 tablet (25 mg total) by mouth 2 (two) times daily. Do not take the night before dialysis, and take 2 hours after dialysis.  Marland Kitchen multivitamin (RENA-VIT) TABS tablet Take 1 tablet by mouth daily.  Marland Kitchen PRESCRIPTION MEDICATION at bedtime.  Marland Kitchen RENAGEL 800 MG tablet Take 4,800 mg by mouth See admin instructions. Take 4800 mg tablets three times daily with meals  . rosuvastatin (CRESTOR) 40 MG tablet Take 1 tablet (40 mg total) by mouth daily. (Patient taking differently: Take 40 mg by mouth at bedtime. )  . sertraline (ZOLOFT) 100 MG tablet Take 100 mg by mouth daily.  . traMADol (ULTRAM) 50 MG tablet Take 1 tablet (50 mg total) by mouth every 6 (  six) hours as needed.   No facility-administered encounter medications on file as of 09/23/2020.     Objective:  BP Readings from Last 3 Encounters:  09/21/20 (!) 156/70  09/15/20 (!) 187/99  08/12/20 125/66   Wt Readings from Last 3 Encounters:  09/21/20 223 lb (101.2 kg)  09/15/20 218 lb 4.8 oz (99 kg)  08/12/20 219 lb (99.3 kg)   Lab Results  Component Value Date   CHOL 186 10/15/2019   HDL 44 10/15/2019   LDLCALC 100 (H) 10/15/2019   TRIG 249 (H) 10/15/2019   CHOLHDL 4.2 10/15/2019   Lab Results  Component Value Date    HGBA1C 9.5 (A) 01/29/2020   HGBA1C 8.2 (A) 11/29/2018   HGBA1C 9.1 (A) 08/01/2018   Lab Results  Component Value Date   MICROALBUR 40.6 (H) 08/05/2014   LDLCALC 100 (H) 10/15/2019   CREATININE 11.87 (H) 11/29/2018   Goals Addressed              This Visit's Progress     Patient Stated   .  "I need to get my diabetes under better control but my motivation comes and goes" (pt-stated)        [place] (see longitudinal plan of care for additional care plan information)  Current Barriers:  . Chronic Disease Management support, education, and care coordination needs related to CAD, HTN, HLD, DMII, and ESRD- patient states due to 4 deaths of friends, family, and boyfriend in the last few months her blood pressure has been very high at times at dialysis, she says it was better at her last dialysis session and only mildly elevated when she saw her surgeon in follow up on 11/8, she also says her blood sugars have been good when they are checked at dialysis, she says she is seeing her therapist weekly to help her deal with the grief from so many deaths in a short period of time.  She reports she is aware she still needs to have the lipid panel and liver panel that Dr [name] ordered during her 06/22/20 office visit with him drawn at dialysis.   Clinical Goal(s) related to CAD, HTN, HLD, DMII, and ESRD:  Over the next 30-60 days, patient will:  . Work with the care management team to address educational, disease management, and care coordination needs  . Begin or continue self health monitoring activities as directed today . Call provider office for new or worsened signs and symptoms New or worsened symptom related to DM, HTN, CAD, ESRD . Call care management team with questions or concerns . Verbalize basic understanding of patient centered plan of care established today  Interventions related to CAD, HTN, HLD, DMII, and ESRD:  . Prior to interviewing patient, reviewed patient status,  including review of recent office visit notes, consultants reports, relevant laboratory and other test results, and medications, completed. . Assessed ongoing recovery from gynecological surgery on 09/15/20 . Assessed patient's self management skills in regards to HTN, IDDM, HLD, CAD and ESRD . Evaluation of current treatment plan related to HTN and IDDM, HLD and ESRD and patient's adherence to plan as established by provider. . Reviewed medications with patient and assessed medication taking behavior;  if indicated, discussed importance of medication adherence  . Positive reinforcement given to patient for her good chronic disease self management skills and for seeking additional counseling from her therapist to deal with her grief . Again encouraged her to complete labs of lipid profile and liver panel ordered  by Dr [name] on 06/22/20 at dialysis . Encouraged patient to contact this CCM RN for any questions or concerns that may arise before next month's call  Patient Self Care Activities related to CAD, HTN, HLD, DMII, and ESRD:  . Patient is unable to independently self-manage chronic health conditions  Please see past updates related to this goal by clicking on the "Past Updates" button in the selected goal      .  COMPLETED: "I'm very uncomfortable since I had my surgery yesterday and sitting in this dialysis chair isn't helping." (pt-stated)        CARE PLAN ENTRY (see longitudinal plan of care for additional care plan information)  Current Barriers:  Marland Kitchen Knowledge Deficits related to managing acute pain- successful outreach to patient via phone to assess her surgical pain and ongoing recovery from outpatient gynecological surgery (dilatation and curettage and hysteroscopy) on 09/15/20  to investigate abnormal uterine bleeding , she says she now feels great, she called her surgeon's office on 11/4 and was prescribed Tramadol, she says some of the pain was due to constipation when has also  resolved, she says she saw her surgeon in follow up on 11/8 and was told the pathology was benign, she says she may need further uterine surgery in 4 months, possibly a hysterectomy  Nurse Case Manager Clinical Goal(s):  Marland Kitchen Over the next 1-7 days, patient will verbalize understanding of plan for managing pain  Interventions:  . Evaluation of current treatment plan related to post operative surgery and patient's adherence to plan as established by provider. Marland Kitchen Positive reinforcement given to patient for calling her surgeon to address her post operative pain  Patient Self Care Activities:  . Patient verbalizes understanding of plan to call surgeon to address her post operative pain management and to make follow up appointment   Please see past updates related to this goal by clicking on the "Past Updates" button in the selected goal          Plan:   The care management team will reach out to the patient again over the next 30-60 days.    Kelli Churn RN, CCM, [facility] RN Care Manager [phone]

## 2020-09-24 NOTE — Progress Notes (Signed)
Attending attestation: This CCM encounter was reviewed and discussed with Dr. CHen and I agree with documentation. Lady Lake Wenzlick, MD 

## 2020-10-19 ENCOUNTER — Encounter: Payer: Medicare Other | Admitting: Obstetrics & Gynecology

## 2020-10-23 ENCOUNTER — Ambulatory Visit: Payer: Medicare Other | Admitting: *Deleted

## 2020-10-23 DIAGNOSIS — E083413 Diabetes mellitus due to underlying condition with severe nonproliferative diabetic retinopathy with macular edema, bilateral: Secondary | ICD-10-CM

## 2020-10-23 DIAGNOSIS — Z794 Long term (current) use of insulin: Secondary | ICD-10-CM

## 2020-10-23 DIAGNOSIS — E1169 Type 2 diabetes mellitus with other specified complication: Secondary | ICD-10-CM

## 2020-10-23 DIAGNOSIS — I10 Essential (primary) hypertension: Secondary | ICD-10-CM

## 2020-10-23 DIAGNOSIS — Z992 Dependence on renal dialysis: Secondary | ICD-10-CM

## 2020-10-23 DIAGNOSIS — N186 End stage renal disease: Secondary | ICD-10-CM

## 2020-10-23 NOTE — Chronic Care Management (AMB) (Signed)
  Chronic Care Management   Note  10/23/2020 Name: ENIYA CANNADY MRN: [id number] DOB: 1976/05/26   Reached patient via phone to complete follow up assessment, purpose of call provided; patient states she is "fine" but was asleep and prefers a call back next week.   Follow up plan: The care management team will reach out to the patient again over the next 7-14 days.   Kelli Churn RN, CCM, [facility] RN Care Manager [phone]

## 2020-10-29 ENCOUNTER — Telehealth: Payer: Medicare Other

## 2020-10-30 ENCOUNTER — Ambulatory Visit: Payer: Medicare Other | Admitting: *Deleted

## 2020-10-30 DIAGNOSIS — Z992 Dependence on renal dialysis: Secondary | ICD-10-CM

## 2020-10-30 DIAGNOSIS — E785 Hyperlipidemia, unspecified: Secondary | ICD-10-CM

## 2020-10-30 DIAGNOSIS — E1169 Type 2 diabetes mellitus with other specified complication: Secondary | ICD-10-CM

## 2020-10-30 DIAGNOSIS — I10 Essential (primary) hypertension: Secondary | ICD-10-CM

## 2020-10-30 DIAGNOSIS — N186 End stage renal disease: Secondary | ICD-10-CM

## 2020-10-30 DIAGNOSIS — E083413 Diabetes mellitus due to underlying condition with severe nonproliferative diabetic retinopathy with macular edema, bilateral: Secondary | ICD-10-CM

## 2020-10-30 DIAGNOSIS — Z794 Long term (current) use of insulin: Secondary | ICD-10-CM

## 2020-10-30 NOTE — Chronic Care Management (AMB) (Signed)
Chronic Care Management   Follow Up Note   10/30/2020 Name: Jane [name] MRN: [id number] DOB: 09-30-1976  Referred by: [name], MD Reason for referral : Chronic Care Management (CAD, ESRD, IDDM, HLD, obesity, depression)   Jane [name] is a 44 y.o. year old female who is a primary care patient of [name], MD. The CCM team was consulted for assistance with chronic disease management and care coordination needs.    Review of patient status, including review of consultants reports, relevant laboratory and other test results, and collaboration with appropriate care team members and the patient's provider was performed as part of comprehensive patient evaluation and provision of chronic care management services.    SDOH (Social Determinants of Health) assessments performed: No See Care Plan activities for detailed interventions related to [facility])     Outpatient Encounter Medications as of 10/30/2020  Medication Sig  . ACCU-CHEK FASTCLIX LANCETS MISC Check blood sugar 4x/day  . acetaminophen (TYLENOL) 500 MG tablet Take 1,000 mg by mouth every 6 (six) hours as needed (for pain.).  Marland Kitchen albuterol (PROVENTIL HFA) 108 (90 Base) MCG/ACT inhaler INHALE 2 PUFFS BY MOUTH EVERY 4 HOURS AS NEEDED FOR COUGHING, WHEEZING, OR SHORTNESS OF BREATH  . amLODipine (NORVASC) 5 MG tablet Take 5 mg by mouth at bedtime.  Marland Kitchen aspirin EC 81 MG tablet Take 1 tablet (81 mg total) by mouth daily.  . Blood Glucose Monitoring Suppl (ACCU-CHEK GUIDE) w/Device KIT 1 each by Does not apply route 3 (three) times daily.  Marland Kitchen buPROPion (WELLBUTRIN SR) 100 MG 12 hr tablet Take 1 tablet (100 mg total) by mouth 2 (two) times daily.  . cyclobenzaprine (FLEXERIL) 5 MG tablet Take 1 tablet (5 mg total) by mouth 3 (three) times daily as needed for muscle spasms.  Marland Kitchen gabapentin (NEURONTIN) 100 MG capsule Take 100 mg by mouth at bedtime.  Marland Kitchen glucose blood (ACCU-CHEK GUIDE) test strip Check blood sugar up to 5 times  a day  . icosapent Ethyl (VASCEPA) 1 g capsule Take 2 capsules (2 g total) by mouth 2 (two) times daily.  . insulin glargine (LANTUS) 100 UNIT/ML injection Inject 0.2 mLs (20 Units total) into the skin at bedtime.  . insulin lispro (HUMALOG KWIKPEN) 100 UNIT/ML KwikPen Inject 4-9 Units into the skin 3 (three) times daily. Use 4-9U three times daily based on your carb counts and correction scale for a maximum of 24U daily  . Insulin Pen Needle 32G X 4 MM MISC Use to inject insulin 3 times a day  . Insulin Syringe-Needle U-100 31G X 15/64" 0.3 ML MISC Use to inject insulin daily  . megestrol (MEGACE) 40 MG tablet Take 2 tablets (80 mg total) by mouth 2 (two) times daily.  . metoprolol tartrate (LOPRESSOR) 25 MG tablet Take 1 tablet (25 mg total) by mouth 2 (two) times daily. Do not take the night before dialysis, and take 2 hours after dialysis.  Marland Kitchen multivitamin (RENA-VIT) TABS tablet Take 1 tablet by mouth daily.  Marland Kitchen PRESCRIPTION MEDICATION at bedtime.  Marland Kitchen RENAGEL 800 MG tablet Take 4,800 mg by mouth See admin instructions. Take 4800 mg tablets three times daily with meals  . rosuvastatin (CRESTOR) 40 MG tablet Take 1 tablet (40 mg total) by mouth daily. (Patient taking differently: Take 40 mg by mouth at bedtime. )  . sertraline (ZOLOFT) 100 MG tablet Take 100 mg by mouth daily.  . traMADol (ULTRAM) 50 MG tablet Take 1 tablet (50 mg total) by mouth every 6 (  six) hours as needed.   No facility-administered encounter medications on file as of 10/30/2020.     Objective:  Wt Readings from Last 3 Encounters:  09/21/20 223 lb (101.2 kg)  09/15/20 218 lb 4.8 oz (99 kg)  08/12/20 219 lb (99.3 kg)   BP Readings from Last 3 Encounters:  09/21/20 (!) 156/70  09/15/20 (!) 187/99  08/12/20 125/66    Goals Addressed            This Visit's Progress   . Make and Keep All Appointments       Timeframe:  Long-Range Goal Priority:  High Start Date:         10/30/20                    Expected End  Date:                       Follow Up Date 12/14/20    - ask family or friend for a ride - call to cancel if needed - keep a calendar with appointment dates    Why is this important?    Part of staying healthy is seeing the doctor for follow-up care.   If you forget your appointments, there are some things you can do to stay on track.    Notes: 2 appointments in future on 11/30/20 with gyn surgeon and 12/25/20 with cardiologist    . Manage My Emotions and acknowledge grief due to los of many loved ones in short period of time       Timeframe:  Long-Range Goal Priority:  High Start Date:         10/30/20                    Expected End Date:                       Follow Up Date 12/15/19   - continue personal counseling - call and visit an old friend - talk about feelings with a friend, family or spiritual advisor - practice positive thinking and self-talk  - keep part time season job to give a reason to get out of the house -have fun with grandchildren     Why is this important?    When you are stressed, down or upset, your body reacts too.   For example, your blood pressure may get higher; you may have a headache or stomachache.   When your emotions get the best of you, your body's ability to fight off cold and flu gets weak.   These steps will help you manage your emotions.     Notes:        Patient Care Plan: General Plan of Care (Adult) for patietn with CAD, ESRD, IDDM, HLD, obesity, depression and situational grief    Problem Identified: Symptoms (Depression)     Goal: Symptoms Monitored and Managed realted to grief from loss of severla fmaily member and friend in short period of time   Start Date: 10/30/2020  This Visit's Progress: On track  Priority: High  Note:   Current Barriers:  . Chronic Disease Management support and education needs related to CAD, ESRD, IDDM, HLD, obesity, depression . Unable to independently manage situational depression  Nurse  Case Manager Clinical Goal(s):  Marland Kitchen Over the next 30-60  days, patient will work with care team to address needs related to situational depression  Interventions:  .  1:1 collaboration with [name], MD regarding development and update of comprehensive plan of care as evidenced by provider attestation and co-signature . Inter-disciplinary care team collaboration (see longitudinal plan of care) . Evaluation of current treatment plan related to depression and patient's adherence to plan as established by provider. . Encourage ongoing participation with weekly therapy sessions  Patient Goals/Self-Care Activities Over the next 30-60  days, patient will: - will self administer medications as prescribed - will attend all scheduled provider appointments - will call provider office for new concerns or questions - continue personal counseling - call and visit an old friend - talk about feelings with a friend, family or spiritual advisor - practice positive thinking and self-talk  - keep part time season job to give a reason to get out of the house -have fun with grandchildren Follow Up Plan: The care management team will reach out to the patient again over the next 30-60 days.          Plan:   The care management team will reach out to the patient again over the next 30-60 days.    Kelli Churn RN, CCM, [facility] RN Care Manager [phone]

## 2020-10-30 NOTE — Patient Instructions (Signed)
Visit Information It was nice speaking with you today. Patient Care Plan: General Plan of Care (Adult) for patietn with CAD, ESRD, IDDM, HLD, obesity, depression and situational grief    Problem Identified: Symptoms (Depression)     Goal: Symptoms Monitored and Managed realted to grief from loss of severla fmaily member and friend in short period of time   Start Date: 10/30/2020  This Visit's Progress: On track  Priority: High  Note:   Current Barriers:  . Chronic Disease Management support and education needs related to CAD, ESRD, IDDM, HLD, obesity, depression . Unable to independently manage situational depression  Nurse Case Manager Clinical Goal(s):  Marland Kitchen Over the next 30-60  days, patient will work with care team to address needs related to situational depression  Interventions:  . 1:1 collaboration with [name], MD regarding development and update of comprehensive plan of care as evidenced by provider attestation and co-signature . Inter-disciplinary care team collaboration (see longitudinal plan of care) . Evaluation of current treatment plan related to depression and patient's adherence to plan as established by provider. . Encourage ongoing participation in weekly therapy sessions  Patient Goals/Self-Care Activities Over the next 30-60  days, patient will: - will self administer medications as prescribed - will attend all scheduled provider appointments - will call provider office for new concerns or questions - continue personal counseling - call and visit an old friend - talk about feelings with a friend, family or spiritual advisor - practice positive thinking and self-talk  - keep part time season job to give a reason to get out of the house -have fun with grandchildren Follow Up Plan: The care management team will reach out to the patient again over the next 30-60 days.          The patient verbalized understanding of instructions, educational materials,  and care plan provided today and declined offer to receive copy of patient instructions, educational materials, and care plan.   The care management team will reach out to the patient again over the next 30-60 days.   Kelli Churn RN, CCM, [facility] RN Care Manager [phone]

## 2020-11-02 NOTE — Progress Notes (Signed)
Internal Medicine Clinic Resident  I have personally reviewed this encounter including the documentation in this note and/or discussed this patient with the care management provider. I will address any urgent items identified by the care management provider and will communicate my actions to the patient's PCP. I have reviewed the patient's CCM visit with my supervising attending, Dr Raines.  Aniah Pauli, MD Internal Medicine Resident PGY-2 [facility] Pager: #[phone] 11/02/2020 8:13 AM     

## 2020-11-02 NOTE — Progress Notes (Signed)
Internal Medicine Clinic Attending  CCM services provided by the care management provider and their documentation were reviewed with Dr. Christian.  We reviewed the pertinent findings, urgent action items addressed by the resident and non-urgent items to be addressed by the PCP.  I agree with the assessment, diagnosis, and plan of care documented in the CCM and resident's note.  Cataleah Stites N Addaline Peplinski, MD 11/02/2020  

## 2020-11-11 ENCOUNTER — Other Ambulatory Visit: Payer: Self-pay

## 2020-11-11 ENCOUNTER — Other Ambulatory Visit: Payer: Self-pay | Admitting: Internal Medicine

## 2020-11-11 DIAGNOSIS — E113493 Type 2 diabetes mellitus with severe nonproliferative diabetic retinopathy without macular edema, bilateral: Secondary | ICD-10-CM

## 2020-11-11 MED ORDER — INSULIN GLARGINE 100 UNIT/ML ~~LOC~~ SOLN: 24.0000 [IU] | Freq: Every day | SUBCUTANEOUS | 3 refills | Status: DC

## 2020-11-11 MED FILL — LANTUS 100 UNITS/ML VIAL: 100 | 83 days supply | Qty: 20 | Fill #0

## 2020-11-11 MED FILL — MEGESTROL 40 MG TABLET: 40 | 30 days supply | Qty: 120 | Fill #3

## 2020-11-11 NOTE — Telephone Encounter (Signed)
Called pharmacy and pt. Talked to  [facility] - informed Novolog to replace Humalog and pt should be on both Lantus and Humalog per Dr Truman Hayward. She stated the pt told her she's on 24 units of Lantus; med list has 20 units. Haynes Dage stated she will need a rx for Lantus with the correct dosage. I called the pt - she stated she has been going thru a lot of grief, death of several people so has not been on her regimen like she should but trying to start back. I asked how much Lantus are you taking; she stated 24 units at bedtime and Humoaog 6 units at mealtime. She needs a refill on Lantus.

## 2020-11-11 NOTE — Telephone Encounter (Signed)
Its was Novolog that was replaced with Humalog due to insurance issues. She should be on both Lantus and Humalog. Did Haynes Dage ran the Humalog rx through or Novolog rx through? Lantus should have always been covered.

## 2020-11-11 NOTE — Telephone Encounter (Signed)
Thank you for clarification. I will send the refills

## 2020-11-11 NOTE — Telephone Encounter (Signed)
Call from [facility] - stated she received a call from the pt about refilling Lantus. Stated back in March, Humalog to replace Lantus. b/c it was not covered. So she ran the rx thru just now again and it is covered by pt's insurance. Pharmacy needs to know if pt is on Lantus and Humalog; if so, how many units? Send new rx. Thanks

## 2020-11-11 NOTE — Telephone Encounter (Signed)
Please call pt back regarding Lantus insulin.

## 2020-11-30 ENCOUNTER — Ambulatory Visit: Payer: Medicare Other | Admitting: Obstetrics & Gynecology

## 2020-12-09 ENCOUNTER — Ambulatory Visit: Payer: Medicare Other

## 2020-12-10 ENCOUNTER — Telehealth: Payer: Self-pay | Admitting: General Practice

## 2020-12-10 NOTE — Telephone Encounter (Signed)
Patient aware of follow up appt on 12/23/2020 at 1:30pm and verbalized understanding.

## 2020-12-14 ENCOUNTER — Telehealth: Payer: Medicare Other

## 2020-12-14 DIAGNOSIS — U071 COVID-19: Secondary | ICD-10-CM | POA: Insufficient documentation

## 2020-12-15 ENCOUNTER — Other Ambulatory Visit: Payer: Self-pay

## 2020-12-15 ENCOUNTER — Ambulatory Visit (INDEPENDENT_AMBULATORY_CARE_PROVIDER_SITE_OTHER): Payer: Medicare Other | Admitting: Student

## 2020-12-15 DIAGNOSIS — U071 COVID-19: Secondary | ICD-10-CM | POA: Diagnosis not present

## 2020-12-16 DIAGNOSIS — U071 COVID-19: Secondary | ICD-10-CM | POA: Insufficient documentation

## 2020-12-16 NOTE — Assessment & Plan Note (Addendum)
Assessment: The patient started having diarrhea 7 days ago and then began having nasal congestion 5 days ago with coughing. She endorses the nasal congestion being severe enough to cause her to have difficulty breathing through her nose. She states she has been drinking little water. She is an ESRD pt and has a fluid restriction of 32 oz per day. She has had little sleep secondary to her cough and nasal congestion. She has taken mucinex-DM inconsistently that has helped.   Encouraged her to drink 32 oz of water daily and if she prefers, mix in some juice for taste (80/20 water to juice) to help with the congestion. We also discussed taking the mucinex-DM daily, two pills in the evening and one in the day time (max dose - 4 daily, no renal adjustments needed). She also endorsed some headaches which are consistent with headaches she has had in the past. Encouraged her to take tylenol for these and avoid NSAIDS with her ESRD.   She was also instructed to call the clinic back if things to not improve or if she suddenly worsens. If she feels as though it was an emergency, instructed her to call 911. She has co-morbid conditions that would make her a candidate for outpatient covid infusion therapy, but at this time I do not believe she would benefit. She is fully vaccinated + boosted  Plan: - Mucinex DM - 2 at night and 1 during the day if needed - Stay hydrated with water, 32 oz daily - tylenol for headache - if worse, consider calling covid infusion center - isolate

## 2020-12-16 NOTE — Progress Notes (Signed)
   CC: Diarrhea, Congestion, Weakness, and Coughing   This is a telephone encounter between Jane [name] and Jane [name] on 12/16/2020 for Diarrhea, Congestion, Weakness, and Coughing secondary to a positive COVID-19 test. The visit was conducted with the patient located at home and Jane [name] at [facility]. The patient's identity was confirmed using their DOB and current address. The patient has consented to being evaluated through a telephone encounter and understands the associated risks (an examination cannot be done and the patient may need to come in for an appointment) / benefits (allows the patient to remain at home, decreasing exposure to coronavirus). I personally spent 15 minutes on medical discussion.   HPI:  [name] is a 45 y.o. with PMH as below.   Please see A&P for assessment of the patient's acute and chronic medical conditions.   Past Medical History:  Diagnosis Date  . Abnormal uterine bleeding (AUB)   . Asthma    prn inhaler  . CAD S/P BMS PCI to prox LAD cardiologist--- Jane Jane [name] LAD to Mid LAD lesion, 90% stenosed. Post intervention - Vision BMS 3.0 mm x 18 mm (~3.5 mm) there is a 0% residual stenosis. ;   nuclear stress test-- 09-13-2018 low risk with no ischemia, nuclear ef 56%  . Charcot foot due to diabetes mellitus ([place]) 11/2016   left 2018; right 2019  . Depression   . Diabetic neuropathy (HCC)    feet  . Diabetic retinopathy, nonproliferative, severe ([place])    bilateral  . ESRD (end stage renal disease) on dialysis [facility])    ?chronic interstitial nephritis  [facility]  . H/O non-ST elevation myocardial infarction (NSTEMI) 03/2016   Found in 90% mid LAD lesion treated with bare-metal stent (BMS) PCI - vision BMS 3.0 mm x 18 mm  . History of MRSA infection 2007  . Hyperlipidemia   . Hypertension   . IDA (iron deficiency anemia)   . Insulin dependent type 2 diabetes mellitus ([place])   . Iron deficiency anemia     takes iron supplement  . PAOD (peripheral arterial occlusive disease) ([place])    vascular--- Jane [name]  . S/P arteriovenous (AV) fistula creation 07/2017  . S/p bare metal coronary artery stent 03/31/2016   BMS x1  to pLAD  . Sickle cell trait (Jane [name])    Review of Systems:  As per HPI  Assessment & Plan:   See Encounters Tab for problem based charting.  Patient discussed with Jane. Fanny Bien Internal Medicine Resident

## 2020-12-16 NOTE — Addendum Note (Signed)
Addended by: Riesa Pope on: 12/16/2020 07:47 AM   Modules accepted: Level of Service

## 2020-12-16 NOTE — Progress Notes (Signed)
Internal Medicine Clinic Attending  Case discussed with Dr. Katsadouros  At the time of the visit.  We reviewed the resident's history and pertinent patient test results.  I agree with the assessment, diagnosis, and plan of care documented in the resident's note.  

## 2020-12-17 MED FILL — MEGESTROL 40 MG TABLET: 40 | 30 days supply | Qty: 120 | Fill #4

## 2020-12-18 ENCOUNTER — Ambulatory Visit: Payer: Medicare Other | Admitting: *Deleted

## 2020-12-18 DIAGNOSIS — Z992 Dependence on renal dialysis: Secondary | ICD-10-CM

## 2020-12-18 DIAGNOSIS — N186 End stage renal disease: Secondary | ICD-10-CM

## 2020-12-18 DIAGNOSIS — E1169 Type 2 diabetes mellitus with other specified complication: Secondary | ICD-10-CM

## 2020-12-18 DIAGNOSIS — Z794 Long term (current) use of insulin: Secondary | ICD-10-CM

## 2020-12-18 DIAGNOSIS — E083413 Diabetes mellitus due to underlying condition with severe nonproliferative diabetic retinopathy with macular edema, bilateral: Secondary | ICD-10-CM

## 2020-12-18 DIAGNOSIS — U071 COVID-19: Secondary | ICD-10-CM

## 2020-12-18 DIAGNOSIS — E785 Hyperlipidemia, unspecified: Secondary | ICD-10-CM

## 2020-12-18 DIAGNOSIS — I10 Essential (primary) hypertension: Secondary | ICD-10-CM

## 2020-12-18 NOTE — Patient Instructions (Signed)
Visit Information It was nice speaking with you today. PATIENT GOALS: Patient Care Plan: CCM RN- General Plan of Care (Adult) for patient with CAD, ESRD, IDDM, HLD, obesity, depression and situational grief    Problem Identified: Symptoms (Depression)     Goal: Symptoms Monitored and Managed related to grief from loss of several  family members, boyfriend and friend in short period of time   Start Date: 10/30/2020  This Visit's Progress: On track  Recent Progress: On track  Priority: High  Note:   Current Barriers:  . Chronic Disease Management support and education needs related to CAD, ESRD, IDDM, HLD, obesity, depression- patient states she is recovering from Covid infection diagnoses on 12/14/20 and is feeling better although still has dry cough . Unable to independently manage situational depression  Nurse Case Manager Clinical Goal(s):  Marland Kitchen Over the next 30-60  days, patient will work with care team to address needs related to situational depression  Interventions:  . 1:1 collaboration with [name], MD regarding development and update of comprehensive plan of care as evidenced by provider attestation and co-signature . Inter-disciplinary care team collaboration (see longitudinal plan of care) . Evaluation of current treatment plan related to depression and patient's adherence to plan as established by provider. . Encourage ongoing participation in weekly therapy sessions  Patient Goals/Self-Care Activities Over the next 30-60  days, patient will: - will self administer medications as prescribed - will attend all scheduled provider appointments - will call provider office for new concerns or questions - continue personal counseling - call and visit an old friend - talk about feelings with a friend, family or spiritual advisor - practice positive thinking and self-talk  - keep part time season job to give a reason to get out of the house -have fun with  grandchildren Follow Up Plan: The care management team will reach out to the patient again over the next 30-60 days.        Patient Care Plan: CCM RN Diabetes Type 2 (Adult)    Problem Identified: Disease Progression (Diabetes, Type 2)     Goal: Disease Progression Prevented or Minimized   Start Date: 01/31/2020  This Visit's Progress: On track  Priority: Low  Note:   CARE PLAN ENTRY (see longitudinal plan of care for additional care plan information)  Current Barriers:  . Chronic Disease Management support, education, and care coordination needs related to CAD, HTN, HLD, DMII, and ESRD- patient states she is not motivated to check her blood sugars and does not want to try a CGM, she says she tries to take her medications as prescribed but often does not, due to lack of motivation she says her blood pressure is also running high at dialysis, she continues to meet with her counselor in a regular basis, she says due to positive Covid test on 12/14/20 her dialysis times were changed to evening; she will resume her normal dialysis schedule once she tests negative for covid x 2  Clinical Goal(s) related to CAD, HTN, HLD, DMII, and ESRD:  Over the next 30-60 days, patient will:  . Work with the care management team to address educational, disease management, and care coordination needs  . Begin or continue self health monitoring activities as directed today . Call provider office for new or worsened signs and symptoms New or worsened symptom related to DM, HTN, CAD, ESRD . Call care management team with questions or concerns . Verbalize basic understanding of patient centered plan of care established today  Interventions related to CAD, HTN, HLD, DMII, and ESRD:  . Prior to interviewing patient, reviewed patient status, including review of recent office visit notes, consultants reports, relevant laboratory and other test results, and medications, completed. . Assessed patient's self management  skills in regards to HTN, IDDM, HLD, CAD and ESRD and new diagnosis of Covid infection . Evaluation of current treatment plan related to HTN and IDDM, HLD and ESRD and patient's adherence to plan as established by provider. . Reviewed medications with patient and assessed medication taking behavior;  if indicated, discussed importance of medication adherence  . Positive reinforcement given to patient for keeping counseling sessions with her therapist to deal with her grief . Encouraged patient to ask pharmacist about OTC cold medicines that would effect BP  . Encouraged patient to contact this CCM RN for any questions or concerns that may arise before next month's call  Patient Self Care Activities related to CAD, HTN, HLD, DMII, and ESRD:  . Patient is unable to independently self-manage chronic health conditions  Please see past updates related to this goal by clicking on the "Past Updates" button in the selected goal       The patient verbalized understanding of instructions, educational materials, and care plan provided today and declined offer to receive copy of patient instructions, educational materials, and care plan.   The care management team will reach out to the patient again over the next 30-60 days.   Kelli Churn RN, CCM, [facility] RN Care Manager [phone]

## 2020-12-18 NOTE — Chronic Care Management (AMB) (Signed)
Chronic Care Management   CCM RN Visit Note  12/18/2020 Name: Jane [name] MRN: [id number] DOB: 11/04/1976  Subjective: Jane [name] is a 45 y.o. year old female who is a primary care patient of Jane Persia, MD. The care management team was consulted for assistance with disease management and care coordination needs.    Engaged with patient by telephone for follow up visit in response to provider referral for case management and/or care coordination services.   Consent to Services:  The patient was given information about Chronic Care Management services, agreed to services, and gave verbal consent prior to initiation of services.  Please see initial visit note for detailed documentation.   Patient agreed to services and verbal consent obtained.   Assessment: Review of patient past medical history, allergies, medications, health status, including review of consultants reports, laboratory and other test data, was performed as part of comprehensive evaluation and provision of chronic care management services.   SDOH (Social Determinants of Health) assessments and interventions performed:    CCM Care Plan  Allergies  Allergen Reactions  . Adhesive [Tape] Other (See Comments)    Irritation     Outpatient Encounter Medications as of 12/18/2020  Medication Sig  . ACCU-CHEK FASTCLIX LANCETS MISC Check blood sugar 4x/day  . acetaminophen (TYLENOL) 500 MG tablet Take 1,000 mg by mouth every 6 (six) hours as needed (for pain.).  Marland Kitchen albuterol (PROVENTIL HFA) 108 (90 Base) MCG/ACT inhaler INHALE 2 PUFFS BY MOUTH EVERY 4 HOURS AS NEEDED FOR COUGHING, WHEEZING, OR SHORTNESS OF BREATH  . amLODipine (NORVASC) 5 MG tablet Take 5 mg by mouth at bedtime.  Marland Kitchen aspirin EC 81 MG tablet Take 1 tablet (81 mg total) by mouth daily.  . Blood Glucose Monitoring Suppl (ACCU-CHEK GUIDE) w/Device KIT 1 each by Does not apply route 3 (three) times daily.  Marland Kitchen buPROPion (WELLBUTRIN SR) 100 MG 12 hr  tablet Take 1 tablet (100 mg total) by mouth 2 (two) times daily.  . cyclobenzaprine (FLEXERIL) 5 MG tablet Take 1 tablet (5 mg total) by mouth 3 (three) times daily as needed for muscle spasms.  Marland Kitchen gabapentin (NEURONTIN) 100 MG capsule Take 100 mg by mouth at bedtime.  Marland Kitchen glucose blood (ACCU-CHEK GUIDE) test strip Check blood sugar up to 5 times a day  . icosapent Ethyl (VASCEPA) 1 g capsule Take 2 capsules (2 g total) by mouth 2 (two) times daily.  . insulin glargine (LANTUS) 100 UNIT/ML injection Inject 0.24 mLs (24 Units total) into the skin at bedtime.  . insulin lispro (HUMALOG KWIKPEN) 100 UNIT/ML KwikPen Inject 4-9 Units into the skin 3 (three) times daily. Use 4-9U three times daily based on your carb counts and correction scale for a maximum of 24U daily  . Insulin Pen Needle 32G X 4 MM MISC Use to inject insulin 3 times a day  . Insulin Syringe-Needle U-100 31G X 15/64" 0.3 ML MISC Use to inject insulin daily  . megestrol (MEGACE) 40 MG tablet Take 2 tablets (80 mg total) by mouth 2 (two) times daily.  . metoprolol tartrate (LOPRESSOR) 25 MG tablet Take 1 tablet (25 mg total) by mouth 2 (two) times daily. Do not take the night before dialysis, and take 2 hours after dialysis.  Marland Kitchen multivitamin (RENA-VIT) TABS tablet Take 1 tablet by mouth daily.  Marland Kitchen PRESCRIPTION MEDICATION at bedtime.  Marland Kitchen RENAGEL 800 MG tablet Take 4,800 mg by mouth See admin instructions. Take 4800 mg tablets three times daily with meals  .  rosuvastatin (CRESTOR) 40 MG tablet Take 1 tablet (40 mg total) by mouth daily. (Patient taking differently: Take 40 mg by mouth at bedtime. )  . sertraline (ZOLOFT) 100 MG tablet Take 100 mg by mouth daily.  . traMADol (ULTRAM) 50 MG tablet Take 1 tablet (50 mg total) by mouth every 6 (six) hours as needed.   No facility-administered encounter medications on file as of 12/18/2020.    Patient Active Problem List   Diagnosis Date Noted  . Acute COVID-19 12/16/2020  . Other disorders of  phosphorus metabolism 02/05/2020  . Left lateral abdominal pain 02/02/2020  . Acute cystitis with hematuria 02/02/2020  . Headache, unspecified 12/04/2019  . Unspecified protein-calorie malnutrition ([place]) 09/20/2019  . Allergy, unspecified, initial encounter 09/02/2019  . Ulcer of left foot due to type 2 diabetes mellitus ([place]) 06/03/2019  . Vaginal discharge 05/07/2019  . Diabetic foot ulcer ([place]) 04/22/2019  . Arthritis of left knee 02/11/2019  . Nausea 11/30/2018  . Encounter for removal of sutures 11/23/2018  . Diarrhea, unspecified 10/13/2018  . Cellulitis, unspecified 05/26/2018  . 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 Patellar tendonitis of left knee 04/04/2018  . Other fluid overload 03/31/2018  . Shortness of breath 02/26/2018  . Hidradenitis suppurativa 02/12/2018  . Anemia in chronic kidney disease 02/12/2018  . Secondary hyperparathyroidism of renal origin ([place]) 01/30/2018  . Encounter for immunization 01/19/2018  . Coagulation defect, unspecified ([place]) 01/18/2018  . Fever, unspecified 01/10/2018  . Pain, unspecified 01/10/2018  . Type 2 diabetes mellitus with other diabetic kidney complication ([place]) 93/81/0175  . Encounter for screening for respiratory tuberculosis 01/10/2018  . Major depressive disorder 08/11/2017  . Sinus tachycardia 02/23/2017  . ESRD (end stage renal disease) on dialysis ([place]) 01/05/2017  . Vitreous floater, left 11/16/2016  . CAD S/P BMS PCI to prox LAD 03/31/2016  . Atypical angina ([place]) 03/29/2016  . Abnormal uterine bleeding (AUB) 03/17/2016  . PAOD (peripheral arterial occlusive disease) ([place]) 01/08/2016  . Charcot foot due to diabetes mellitus ([place]) 09/07/2015  . Peripheral neuropathy 09/20/2014  . Essential hypertension 08/16/2014  . Seasonal allergic rhinitis 08/16/2014  . URI (upper respiratory infection) 08/05/2014  . Iron deficiency anemia 10/25/2013  . Morbid obesity ([place]) 09/26/2012  .  Hyperlipidemia associated with type 2 diabetes mellitus ([place]) 02/26/2009  . 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 Asthma 05/08/2007    Conditions to be addressed/monitored:CAD, ESRD with 32 oz fluid restrticiton per day, IDDM, HLD, obesity, depression, Covid + on 12/14/20   Care Plan : CCM RN- General Plan of Care (Adult) for patient with CAD, ESRD, IDDM, HLD, obesity, depression and situational grief  Updates made by Barrington Ellison, RN since 12/18/2020 12:00 AM    Problem: Symptoms (Depression)     Goal: Symptoms Monitored and Managed related to grief from loss of several  family members, boyfriend and friend in short period of time   Start Date: 10/30/2020  This Visit's Progress: On track  Recent Progress: On track  Priority: High  Note:   Current Barriers:  . Chronic Disease Management support and education needs related to CAD, ESRD, IDDM, HLD, obesity, depression- patient states she is recovering from Covid infection diagnoses on 12/14/20 and is feeling better although still has dry cough . Unable to independently manage situational depression  Nurse Case Manager Clinical Goal(s):  Marland Kitchen Over the next 30-60  days, patient will  work with care team to address needs related to situational depression  Interventions:  . 1:1 collaboration with Jane Persia, MD regarding development and update of comprehensive plan of care as evidenced by provider attestation and co-signature . Inter-disciplinary care team collaboration (see longitudinal plan of care) . Evaluation of current treatment plan related to depression and patient's adherence to plan as established by provider. . Encourage ongoing participation in weekly therapy sessions  Patient Goals/Self-Care Activities Over the next 30-60  days, patient will: - will self administer medications as prescribed - will attend all scheduled provider  appointments - will call provider office for new concerns or questions - continue personal counseling - call and visit an old friend - talk about feelings with a friend, family or spiritual advisor - practice positive thinking and self-talk  - keep part time season job to give a reason to get out of the house -have fun with grandchildren Follow Up Plan: The care management team will reach out to the patient again over the next 30-60 days.        Care Plan : CCM RN Diabetes Type 2 (Adult)  Updates made by Barrington Ellison, RN since 12/18/2020 12:00 AM    Problem: Disease Progression (Diabetes, Type 2)     Goal: Disease Progression Prevented or Minimized   Start Date: 01/31/2020  This Visit's Progress: On track  Priority: Low  Note:   CARE PLAN ENTRY (see longitudinal plan of care for additional care plan information)  Current Barriers:  . Chronic Disease Management support, education, and care coordination needs related to CAD, HTN, HLD, DMII, and ESRD- patient states she is not motivated to check her blood sugars and does not want to try a CGM, she says she tries to take her medications as prescribed but often does not, due to lack of motivation she says her blood pressure is also running high at dialysis, she continues to meet with her counselor in a regular basis, she says due to positive Covid test on 12/14/20 her dialysis times were changed to evening; she will resume her normal dialysis schedule once she tests negative for covid x 2  Clinical Goal(s) related to CAD, HTN, HLD, DMII, and ESRD:  Over the next 30-60 days, patient will:  . Work with the care management team to address educational, disease management, and care coordination needs  . Begin or continue self health monitoring activities as directed today . Call provider office for new or worsened signs and symptoms New or worsened symptom related to DM, HTN, CAD, ESRD . Call care management team with questions or  concerns . Verbalize basic understanding of patient centered plan of care established today  Interventions related to CAD, HTN, HLD, DMII, and ESRD:  . Prior to interviewing patient, reviewed patient status, including review of recent office visit notes, consultants reports, relevant laboratory and other test results, and medications, completed. . Assessed patient's self management skills in regards to HTN, IDDM, HLD, CAD and ESRD and new diagnosis of Covid infection . Evaluation of current treatment plan related to HTN and IDDM, HLD and ESRD and patient's adherence to plan as established by provider. . Reviewed medications with patient and assessed medication taking behavior;  if indicated, discussed importance of medication adherence  . Positive reinforcement given to patient for keeping counseling sessions with her therapist to deal with her grief related to multiple deaths of friends/family . Encouraged patient to ask pharmacist about OTC cold medicines that would effect BP  .  Encouraged patient to contact this CCM RN for any questions or concerns that may arise before next month's call  Patient Self Care Activities related to CAD, HTN, HLD, DMII, and ESRD:  . Patient is unable to independently self-manage chronic health conditions  Please see past updates related to this goal by clicking on the "Past Updates" button in the selected goal       Plan:The care management team will reach out to the patient again over the next 30-60 days.   Kelli Churn RN, CCM, [facility] RN Care Manager [phone]

## 2020-12-21 NOTE — Progress Notes (Signed)
Internal Medicine Clinic Resident  I have personally reviewed this encounter including the documentation in this note and/or discussed this patient with the care management provider. I will address any urgent items identified by the care management provider and will communicate my actions to the patient's PCP. I have reviewed the patient's CCM visit with my supervising attending, Dr Heber Keswick.  Lorene Dy, MD 12/21/2020

## 2020-12-23 ENCOUNTER — Ambulatory Visit: Payer: Medicare Other | Admitting: Obstetrics & Gynecology

## 2020-12-25 ENCOUNTER — Ambulatory Visit: Payer: Medicare Other | Admitting: Cardiology

## 2020-12-28 ENCOUNTER — Ambulatory Visit: Payer: Medicare Other

## 2020-12-28 ENCOUNTER — Encounter: Payer: Medicare Other | Admitting: Internal Medicine

## 2020-12-28 ENCOUNTER — Other Ambulatory Visit: Payer: Self-pay

## 2020-12-28 ENCOUNTER — Encounter: Payer: Self-pay | Admitting: Obstetrics & Gynecology

## 2020-12-28 ENCOUNTER — Ambulatory Visit (INDEPENDENT_AMBULATORY_CARE_PROVIDER_SITE_OTHER): Payer: Medicare Other | Admitting: Obstetrics & Gynecology

## 2020-12-28 VITALS — BP 174/83 | HR 95 | Ht 65.0 in | Wt 225.0 lb

## 2020-12-28 DIAGNOSIS — N938 Other specified abnormal uterine and vaginal bleeding: Secondary | ICD-10-CM | POA: Diagnosis not present

## 2020-12-28 DIAGNOSIS — Z01812 Encounter for preprocedural laboratory examination: Secondary | ICD-10-CM

## 2020-12-28 DIAGNOSIS — Z30430 Encounter for insertion of intrauterine contraceptive device: Secondary | ICD-10-CM | POA: Diagnosis not present

## 2020-12-28 LAB — POCT URINE PREGNANCY: Preg Test, Ur: NEGATIVE

## 2020-12-28 MED ORDER — LEVONORGESTREL 19.5 MCG/DAY IU IUD
INTRAUTERINE_SYSTEM | Freq: Once | INTRAUTERINE | Status: AC
  Administered 2020-12-28: 1 via INTRAUTERINE

## 2020-12-28 NOTE — Progress Notes (Deleted)
   CC: ***  HPI:  [name] is a 45 y.o.   Past Medical History:  Diagnosis Date  . Abnormal uterine bleeding (AUB)   . Asthma    prn inhaler  . CAD S/P BMS PCI to prox LAD cardiologist--- dr Beverlyn Roux LAD to Mid LAD lesion, 90% stenosed. Post intervention - Vision BMS 3.0 mm x 18 mm (~3.5 mm) there is a 0% residual stenosis. ;   nuclear stress test-- 09-13-2018 low risk with no ischemia, nuclear ef 56%  . Charcot foot due to diabetes mellitus ([place]) 11/2016   left 2018; right 2019  . Depression   . Diabetic neuropathy (HCC)    feet  . Diabetic retinopathy, nonproliferative, severe ([place])    bilateral  . ESRD (end stage renal disease) on dialysis [facility])    ?chronic interstitial nephritis  [facility]  . H/O non-ST elevation myocardial infarction (NSTEMI) 03/2016   Found in 90% mid LAD lesion treated with bare-metal stent (BMS) PCI - vision BMS 3.0 mm x 18 mm  . History of MRSA infection 2007  . Hyperlipidemia   . Hypertension   . IDA (iron deficiency anemia)   . Insulin dependent type 2 diabetes mellitus ([place])   . Iron deficiency anemia    takes iron supplement  . PAOD (peripheral arterial occlusive disease) ([place])    vascular--- dr Bridgett Larsson  . S/P arteriovenous (AV) fistula creation 07/2017  . S/p bare metal coronary artery stent 03/31/2016   BMS x1  to pLAD  . Sickle cell trait ([place])    Review of Systems:  ***  Physical Exam:  There were no vitals filed for this visit. ***  Assessment & Plan:   See Encounters Tab for problem based charting.  Patient {GC/GE:[id number]::"discussed with","seen with"} Dr. {NAMES:[id number]::"Butcher","Guilloud","Hoffman","Mullen","Narendra","Raines","Vincent"}

## 2020-12-28 NOTE — Progress Notes (Signed)
History:  45 y.o. [id number] here today for pt is s/p hysteroscopy with D&C on 09/15/2020. Pt reports that she cont to have bleeding daily. She stopped the Megace initially but, restarted it and now is taking 4 per day.  She has been unable to get back in to the ofc due to health issues and she recently had COVID.    The following portions of the patient's history were reviewed and updated as appropriate: allergies, current medications, past family history, past medical history, past social history, past surgical history and problem list.  Review of Systems:  Pertinent items are noted in HPI.    Objective:  Physical Exam  CONSTITUTIONAL: Well-developed, well-nourished female in no acute distress.  HENT:  Normocephalic, atraumatic EYES: Conjunctivae and EOM are normal. No scleral icterus.  NECK: Normal range of motion SKIN: Skin is warm and dry. No rash noted. Not diaphoretic.No pallor. [place]: Alert and oriented to person, place, and time. Normal coordination.  Pelvic: Normal appearing external genitalia; normal appearing vaginal mucosa and cervix.  Normal discharge.  There is a small amount of blood in the vagina.  GYNECOLOGY CLINIC PROCEDURE NOTE  SHLOKA BALDRIDGE is a 45 y.o. [id number] here for [place] IUD insertion for AUB.  IUD Insertion Procedure Note Patient identified, informed consent performed.  Discussed risks of irregular bleeding, cramping, infection, malpositioning or misplacement of the IUD outside the uterus which may require further procedures. Time out was performed.  Urine pregnancy test negative.  Speculum placed in the vagina.  Cervix visualized.  Cleaned with Betadine x 2.  Grasped anteriorly with a single tooth tenaculum.  Uterus sounded to 9cm. Liletta IUD placed per manufacturer's recommendations.  Strings trimmed to 3 cm. Tenaculum was removed, good hemostasis noted.  Patient tolerated procedure well.   Labs and Imaging 09/15/2020 ENDOMETRIUM, CURETTAGE:  -  Benign inactive endometrium with hormone-effect  - Adenomyosis  - Negative for hyperplasia or malignancy   Assessment & Plan:  AUB- pt is s/p D&C with hysteroscopy.  The dx showed adenomyosis. D/w with pt treatment options. Pt would like to have a hysterectomy but, she has multiple medical problems. I have discussed placing a LnIUD and pt agrees to try this to control her bleeding.   Patient was given post-procedure instructions.  Patient was asked to follow up in 4 weeks for IUD check.  Cece Milhouse L. Harraway-Smith, M.D., [name]

## 2020-12-28 NOTE — Patient Instructions (Signed)
Levonorgestrel intrauterine device (IUD) What is this medicine? LEVONORGESTREL IUD (LEE voe nor jes trel) is a contraceptive (birth control) device. The device is placed inside the uterus by a health care provider. It is used to prevent pregnancy. Some devices can also be used to treat heavy bleeding that occurs during your period. This medicine may be used for other purposes; ask your health care provider or pharmacist if you have questions. COMMON BRAND NAME(S): Kyleena, LILETTA, Mirena, Skyla What should I tell my health care provider before I take this medicine? They need to know if you have any of these conditions:  abnormal Pap smear  cancer of the breast, uterus, or cervix  diabetes  endometritis  genital or pelvic infection now or in the past  have more than one sexual partner or your partner has more than one partner  heart disease  history of an ectopic or tubal pregnancy  immune system problems  IUD in place  liver disease or tumor  problems with blood clots or take blood-thinners  seizures  use intravenous drugs  uterus of unusual shape  vaginal bleeding that has not been explained  an unusual or allergic reaction to levonorgestrel, other hormones, silicone, or polyethylene, medicines, foods, dyes, or preservatives  pregnant or trying to get pregnant  breast-feeding How should I use this medicine? This device is placed inside the uterus by a health care professional. Talk to your pediatrician regarding the use of this medicine in children. Special care may be needed. Overdosage: If you think you have taken too much of this medicine contact a poison control center or emergency room at once. NOTE: This medicine is only for you. Do not share this medicine with others. What if I miss a dose? This does not apply. Depending on the brand of device you have inserted, the device will need to be replaced every 3 to 7 years if you wish to continue using this type  of birth control. What may interact with this medicine? Do not take this medicine with any of the following medications:  amprenavir  bosentan  fosamprenavir This medicine may also interact with the following medications:  aprepitant  armodafinil  barbiturate medicines for inducing sleep or treating seizures  bexarotene  boceprevir  griseofulvin  medicines to treat seizures like carbamazepine, ethotoin, felbamate, oxcarbazepine, phenytoin, topiramate  modafinil  pioglitazone  rifabutin  rifampin  rifapentine  some medicines to treat HIV infection like atazanavir, efavirenz, indinavir, lopinavir, nelfinavir, tipranavir, ritonavir  St. John's wort  warfarin This list may not describe all possible interactions. Give your health care provider a list of all the medicines, herbs, non-prescription drugs, or dietary supplements you use. Also tell them if you smoke, drink alcohol, or use illegal drugs. Some items may interact with your medicine. What should I watch for while using this medicine? Visit your doctor or health care professional for regular check ups. See your doctor if you or your partner has sexual contact with others, becomes HIV positive, or gets a sexual transmitted disease. This product does not protect you against HIV infection (AIDS) or other sexually transmitted diseases. You can check the placement of the IUD yourself by reaching up to the top of your vagina with clean fingers to feel the threads. Do not pull on the threads. It is a good habit to check placement after each menstrual period. Call your doctor right away if you feel more of the IUD than just the threads or if you cannot feel the threads   at all. The IUD may come out by itself. You may become pregnant if the device comes out. If you notice that the IUD has come out use a backup birth control method like condoms and call your health care provider. Using tampons will not change the position of the  IUD and are okay to use during your period. This IUD can be safely scanned with magnetic resonance imaging (MRI) only under specific conditions. Before you have an MRI, tell your healthcare provider that you have an IUD in place, and which type of IUD you have in place. What side effects may I notice from receiving this medicine? Side effects that you should report to your doctor or health care professional as soon as possible:  allergic reactions like skin rash, itching or hives, swelling of the face, lips, or tongue  fever, flu-like symptoms  genital sores  high blood pressure  no menstrual period for 6 weeks during use  pain, swelling, warmth in the leg  pelvic pain or tenderness  severe or sudden headache  signs of pregnancy  stomach cramping  sudden shortness of breath  trouble with balance, talking, or walking  unusual vaginal bleeding, discharge  yellowing of the eyes or skin Side effects that usually do not require medical attention (report to your doctor or health care professional if they continue or are bothersome):  acne  breast pain  change in sex drive or performance  changes in weight  cramping, dizziness, or faintness while the device is being inserted  headache  irregular menstrual bleeding within first 3 to 6 months of use  nausea This list may not describe all possible side effects. Call your doctor for medical advice about side effects. You may report side effects to FDA at 1-800-FDA-1088. Where should I keep my medicine? This does not apply. NOTE: This sheet is a summary. It may not cover all possible information. If you have questions about this medicine, talk to your doctor, pharmacist, or health care provider.  2021 Elsevier/Gold Standard (2020-06-30 16:27:45) Intrauterine Device Insertion, Care After This sheet gives you information about how to care for yourself after your procedure. Your health care provider may also give you more  specific instructions. If you have problems or questions, contact your health care provider. What can I expect after the procedure? After the procedure, it is common to have:  Cramps and pain in the abdomen.  Bleeding. It may be light or heavy. This may last for a few days.  Lower back pain.  Dizziness.  Headaches.  Nausea. Follow these instructions at home:  Before resuming sexual activity, check to make sure that you can feel the IUD string or strings. You should be able to feel the end of the string below the opening of your cervix. If your IUD string is in place, you may resume sexual activity. ? If you had a hormonal IUD inserted more than 7 days after your most recent period started, you will need to use a backup method of birth control for 7 days after IUD insertion. Ask your health care provider whether this applies to you.  Continue to check that the IUD is still in place by feeling for the strings after every menstrual period, or once a month.  An IUD will not protect you from sexually transmitted infections (STIs). Use methods to prevent the exchange of body fluids between partners (barrier protection) every time you have sex. Barrier protection can be used during oral, vaginal, or anal sex.   Commonly used barrier methods include: ? Female condom. ? Female condom. ? Dental dam.  Take over-the-counter and prescription medicines only as told by your health care provider.  Keep all follow-up visits as told by your health care provider. This is important.   Contact a health care provider if:  You feel light-headed or weak.  You have any of the following problems with your IUD string or strings: ? The string bothers or hurts you or your sexual partner. ? You cannot feel the string. ? The string has gotten longer.  You can feel the IUD in your vagina.  You think you may be pregnant, or you miss your menstrual period.  You think you may have a sexually transmitted  infection (STI). Get help right away if:  You have flu-like symptoms, such as tiredness (fatigue) and muscle aches.  You have a fever and chills.  You have bleeding that is heavier or lasts longer than a normal menstrual cycle.  You have abnormal or bad-smelling discharge from your vagina.  You develop abdominal pain that is new, is getting worse, or is not in the same area of earlier cramping and pain.  You have pain during sexual activity. Summary  After the procedure, it is common to have cramps and pain in the abdomen. It is also common to have light bleeding or heavier bleeding that is like your menstrual period.  Continue to check that the IUD is still in place by feeling for the strings after every menstrual period, or once a month.  Keep all follow-up visits as told by your health care provider. This is important.  Contact your health care provider if you have problems with your IUD strings, such as the string getting longer or bothering you or your sexual partner. This information is not intended to replace advice given to you by your health care provider. Make sure you discuss any questions you have with your health care provider. Document Revised: 10/22/2019 Document Reviewed: 10/22/2019 Elsevier Patient Education  2021 Elsevier Inc.  

## 2020-12-29 MED FILL — GABAPENTIN 100 MG CAPSULE: 100 | 30 days supply | Qty: 30 | Fill #2

## 2021-01-06 ENCOUNTER — Ambulatory Visit (INDEPENDENT_AMBULATORY_CARE_PROVIDER_SITE_OTHER): Payer: Medicare Other | Admitting: Cardiology

## 2021-01-06 ENCOUNTER — Other Ambulatory Visit: Payer: Self-pay

## 2021-01-06 VITALS — BP 136/64 | HR 99 | Ht 65.0 in | Wt 218.0 lb

## 2021-01-06 DIAGNOSIS — I110 Hypertensive heart disease with heart failure: Secondary | ICD-10-CM | POA: Insufficient documentation

## 2021-01-06 DIAGNOSIS — I2510 Atherosclerotic heart disease of native coronary artery without angina pectoris: Secondary | ICD-10-CM | POA: Diagnosis not present

## 2021-01-06 DIAGNOSIS — R0600 Dyspnea, unspecified: Secondary | ICD-10-CM

## 2021-01-06 DIAGNOSIS — I2089 Other forms of angina pectoris: Secondary | ICD-10-CM

## 2021-01-06 DIAGNOSIS — I5032 Chronic diastolic (congestive) heart failure: Secondary | ICD-10-CM

## 2021-01-06 DIAGNOSIS — Z992 Dependence on renal dialysis: Secondary | ICD-10-CM

## 2021-01-06 DIAGNOSIS — N186 End stage renal disease: Secondary | ICD-10-CM | POA: Diagnosis not present

## 2021-01-06 DIAGNOSIS — R000 Tachycardia, unspecified: Secondary | ICD-10-CM

## 2021-01-06 DIAGNOSIS — E1169 Type 2 diabetes mellitus with other specified complication: Secondary | ICD-10-CM

## 2021-01-06 DIAGNOSIS — R0609 Other forms of dyspnea: Secondary | ICD-10-CM

## 2021-01-06 DIAGNOSIS — Z9861 Coronary angioplasty status: Secondary | ICD-10-CM

## 2021-01-06 DIAGNOSIS — E785 Hyperlipidemia, unspecified: Secondary | ICD-10-CM

## 2021-01-06 DIAGNOSIS — I10 Essential (primary) hypertension: Secondary | ICD-10-CM | POA: Diagnosis not present

## 2021-01-06 DIAGNOSIS — I208 Other forms of angina pectoris: Secondary | ICD-10-CM

## 2021-01-06 NOTE — Patient Instructions (Signed)
Medication Instructions:  Your physician recommends that you continue on your current medications as directed. Please refer to the Current Medication list given to you today.  *If you need a refill on your cardiac medications before your next appointment, please call your pharmacy*  Lab Work: [facility]   If you have labs (blood work) drawn today and your tests are completely normal, you will receive your results only by: Marland Kitchen MyChart Message (if you have MyChart) OR . A paper copy in the mail If you have any lab test that is abnormal or we need to change your treatment, we will call you to review the results.  Testing/Procedures: Your physician has requested that you have an echocardiogram. Echocardiography is a painless test that uses sound waves to create images of your heart. It provides your doctor with information about the size and shape of your heart and how well your heart's chambers and valves are working. This procedure takes approximately one hour. There are no restrictions for this procedure. [address]  Follow-Up: At [facility], you and your health needs are our priority.  As part of our continuing mission to provide you with exceptional heart care, we have created designated Provider Care Teams.  These Care Teams include your primary Cardiologist (physician) and Advanced Practice Providers (APPs -  Physician Assistants and Nurse Practitioners) who all work together to provide you with the care you need, when you need it.  We recommend signing up for the patient portal called "MyChart".  Sign up information is provided on this After Visit Summary.  MyChart is used to connect with patients for Virtual Visits (Telemedicine).  Patients are able to view lab/test results, encounter notes, upcoming appointments, etc.  Non-urgent messages can be sent to your provider as well.   To learn more about what you can do with MyChart, go to  [email or web address].    Your next appointment:   6 month(s)  The format for your next appointment:   In Person  Provider:   You may see Glenetta Hew, MD or one of the following Advanced Practice Providers on your designated Care Team:    Rosaria Ferries, PA-C  Jory Sims, DNP, ANP

## 2021-01-06 NOTE — Progress Notes (Signed)
Primary Care Provider: [name], MD Cardiologist: Glenetta Hew, MD Electrophysiologist: None  Clinic Note: Chief Complaint  Patient presents with  . Follow-up    Had recent infection with COVID-19 had missed a couple doses of hemodialysis.  Now trying to catch up with dialysis.  She is still a bit more short of breath than usual, with more PND and orthopnea.    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 Most recent Myoview was in 2019, nonischemic.  She remains on amlodipine and stable dose of metoprolol.  Not requiring.  Nitroglycerin.      Sinus tachycardia    Continue beta-blocker.  I suspect we may need to titrate this up further.      DOE (dyspnea on exertion)   Relevant Orders   ECHOCARDIOGRAM COMPLETE   CAD S/P BMS PCI to prox LAD - Primary (Chronic)    Ostial mid LAD BMS PCI in the setting of non-STEMI (BMS used because of need for potential surgery).  Remains on aspirin now with Thienopyridine.  Plan:  She is on beta-blocker and calcium channel blocker stable dose.  Can titrate up further because of hypotension during dialysis.  She is on Vascepa and Crestor.  With her having little more than usual dyspnea on exertion and orthopnea, we will recheck an echocardiogram just to make sure that there is been no change of EF.      Chronic diastolic heart failure (HCC) (Chronic)    More symptomatic than usual, I think is probably because of the Covid infection and missing a few days of dialysis.  She may still be volume overloaded.  She does not do well with lots of volume removal.  Suspect that she will start feeling better over the next couple weeks.  Next Plan: Check 2D echo. Cannot really be all that much more aggressive with blood pressure control than she is currently because of hypotension in  dialysis.      Relevant Orders   ECHOCARDIOGRAM COMPLETE   Hyperlipidemia associated with type 2 diabetes mellitus ([place]) (Chronic)    She did not get lipids checked last time.  Will order now.  Continue current dose of rosuvastatin and Vascepa..      Relevant Orders   Lipid panel (Completed)   Hepatic function panel (Completed)   Morbid obesity ([place]) (Chronic)    She is lost some weight.  Still needs lose more.  No longer in the morbidly obese category.      Essential hypertension (Chronic)    Blood pressures are very labile, all over the place.  I think this point we need to allow her pressures to be okay because they go down during dialysis and are very high predialysis. I do want her to stay on amlodipine and metoprolol.      Relevant Orders   Lipid panel (Completed)   ECHOCARDIOGRAM COMPLETE   Hepatic function panel (Completed)   ESRD (end stage renal disease) on dialysis ([place]) (Chronic)      ===================================  HPI:    Jane [name] is a 45 y.o. female with a PMH notable for poorly controlled severe diabetes mellitus type 2 (history of Charcot foot with multiple surgeries), ESRD (nephrotic range proteinuria)-on HD, single-vessel CAD (BMS PCI to LAD May 2017 (non-STEMI) who presents today for 75-month follow-up.   Myoview October 2019: Low risk.  No  ischemia or infarction.  Normal EF.  Jane [name] was last seen on June 22, 2020.  She was in a great mood.  Happy smiling wearing sunglasses.  She is was getting her finances in order and opened by a vehicle.  Over this would help her get back and forth from dialysis.  She also has stable living arrangement.  She definitely feels volume buildup during the 2 days between the the weekend dialysis in the first week dialysis.  Her blood pressure tends to go down during dialysis, so she was looking to treat it.  She usually has to stay seated for about half an hour after dialysis just to get her bearings  straight. ->  Sleeping better.  Still has some allergy related congestion.  Right foot feeling better.  Left knee hurts.  Limits her walking.  Nervous and anxious, insomnia.  Recent Hospitalizations:   September 15, 2020: D&C hysteroscopy  Reviewed  CV studies:    The following studies were reviewed today: (if available, images/films reviewed: From Epic Chart or Care Everywhere) . None:   Interval History:   Jane [name] presents here today for 72-month follow-up stating that she is doing okay, but she missed 1 or 2 days of hemodialysis because of Covid earlier this month.  She is finally starting to get her dry weight back to normal.  She does not do well in the pool of a lot of fluid and therefore it takes a while for her to get back in the baseline.  She noted that today she does get more short of breath than she had been and she is notes a little more swelling.  With each dialysis today she is feeling better.  Blood pressures have been okay but was low as 107/95 and dialysis.  Other than the cough and shortness of breath associated Covid and missing a couple dialysis days, she is not having any chest pain or pressure with rest or exertion.  She does have edema that goes down with dialysis.  Her chest hurts all over from coughing a lot, but not anything like angina.  Not really walking a lot but has not noted claudication..  CV Review of Symptoms (Summary): positive for - dyspnea on exertion, edema, palpitations and Imaging dialysis today she does note some orthopnea and PND since he missed a couple days of dialysis. negative for - paroxysmal nocturnal dyspnea, rapid heart rate or Anginal type (not musculoskeletal) chest pain  The patient does not currently have symptoms concerning for COVID-19 infection (fever, chills, cough, or new shortness of breath).  She recently had infection, now cleared.  Still has cough and shortness of breath, but no fevers or chills.  REVIEWED OF SYSTEMS    Review of Systems  Constitutional: Positive for malaise/fatigue (Has not been as energetic since Covid). Negative for weight loss.       No further fevers and chills for the last week or so  HENT: Positive for congestion. Negative for nosebleeds.   Respiratory: Positive for cough (This is clearing up, but still present) and shortness of breath (Worse since Covid.  Both from the infection and from last dialysis.).   Cardiovascular: Positive for leg swelling.  Gastrointestinal: Negative for blood in stool and melena.  Genitourinary: Negative for hematuria.  Musculoskeletal: Positive for joint pain (Right foot still hurts some.).  Neurological: Positive for dizziness (Only gets dizzy when her blood pressure drops during dialysis.). Negative for focal weakness.  Endo/Heme/Allergies: Positive for environmental allergies.  Psychiatric/Behavioral: Negative for memory loss. The patient has insomnia. The patient is not nervous/anxious.    I have reviewed and (if needed) personally updated the patient's problem list, medications, allergies, past medical and surgical history, social and family history.   PAST MEDICAL HISTORY   Past Medical History:  Diagnosis Date  . Abnormal uterine bleeding (AUB)   . Asthma    prn inhaler  . CAD S/P BMS PCI to prox LAD cardiologist--- dr Beverlyn Roux LAD to Mid LAD lesion, 90% stenosed. Post intervention - Vision BMS 3.0 mm x 18 mm (~3.5 mm) there is a 0% residual stenosis. ;   nuclear stress test-- 09-13-2018 low risk with no ischemia, nuclear ef 56%  . Charcot foot due to diabetes mellitus ([place]) 11/2016   left 2018; right 2019  . Depression   . Diabetic neuropathy (HCC)    feet  . Diabetic retinopathy, nonproliferative, severe ([place])    bilateral  . ESRD (end stage renal disease) on dialysis [facility])    ?chronic interstitial nephritis  [facility]  . H/O non-ST elevation myocardial infarction (NSTEMI) 03/2016   Found in 90% mid LAD lesion  treated with bare-metal stent (BMS) PCI - vision BMS 3.0 mm x 18 mm  . History of MRSA infection 2007  . Hyperlipidemia   . Hypertension   . IDA (iron deficiency anemia)   . Insulin dependent type 2 diabetes mellitus ([place])   . Iron deficiency anemia    takes iron supplement  . PAOD (peripheral arterial occlusive disease) ([place])    vascular--- dr Bridgett Larsson  . S/P arteriovenous (AV) fistula creation 07/2017  . S/p bare metal coronary artery stent 03/31/2016   BMS x1  to pLAD  . Sickle cell trait ([place])     PAST SURGICAL HISTORY   Past Surgical History:  Procedure Laterality Date  . ACHILLES TENDON LENGTHENING Left 11/24/2016   Procedure: Left Achilles tendon lengthening (open);  Surgeon: Wylene Simmer, MD;  Location: [place];  Service: Orthopedics;  Laterality: Left;  . AV FISTULA PLACEMENT Right 07/31/2017   Procedure: ARTERIOVENOUS BRACHIOCEPHALIC FISTULA CREATION RIGHT ARM;  Surgeon: Conrad Pettus, MD;  Location: [place];  Service: Vascular;  Laterality: Right;  . CALCANEAL OSTEOTOMY Left 11/24/2016   Procedure: Left hindfoot osteotomy and fusion;  Surgeon: Wylene Simmer, MD;  Location: [place];  Service: Orthopedics;  Laterality: Left;  . CARDIAC CATHETERIZATION N/A 03/31/2016   Procedure: Left Heart Cath and Coronary Angiography;  Surgeon: Lorretta Harp, MD;  Location: [facility] INVASIVE CV LAB: 90% early mLAD, normal LV Fxn  . CARDIAC CATHETERIZATION N/A 03/31/2016   Procedure: Coronary Stent Intervention;  Surgeon: Lorretta Harp, MD;  Location: [place] CV LAB;  Service: Cardiovascular: PCI to mLAD BMS Vision 3.0 mm x 18 mm  . CESAREAN SECTION  1997  . DILITATION & CURRETTAGE/HYSTROSCOPY WITH NOVASURE ABLATION N/A 09/15/2020   Procedure: DILATATION & CURETTAGE/HYSTEROSCOPY;  Surgeon: Lavonia Drafts, MD;  Location: [place];  Service: Gynecology;  Laterality: N/A;  . FISTULA SUPERFICIALIZATION Right 11/15/2017   Procedure: FISTULA  SUPERFICIALIZATION RIGHT BRACHIOCEPHALIC  RIGHT;  Surgeon: Conrad Geary, MD;  Location: [place];  Service: Vascular;  Laterality: Right;  . FOOT SURGERY     multiple for [place]  . TUBAL LIGATION  1997     Cardiac catheterization with PCI May 2017:90% mLAD; nl LV Fxn--> PCI- Vision BMS3.66mmx 18 mm   Immunization History  Administered Date(s) Administered  .  Influenza Whole 01/11/2009, 07/22/2011  . Influenza,inj,Quad PF,6+ Mos 08/05/2014, 12/24/2015, 08/04/2016, 08/29/2017, 08/01/2018  . Moderna Sars-Covid-2 Vaccination 02/27/2020, 03/26/2020  . PFIZER(Purple Top)SARS-COV-2 Vaccination 08/13/2020  . Pneumococcal Conjugate-13 08/01/2018  . Pneumococcal Polysaccharide-23 01/11/2009, 08/15/2014  . Tdap 10/28/2013    MEDICATIONS/ALLERGIES   Current Meds  Medication Sig  . ACCU-CHEK FASTCLIX LANCETS MISC Check blood sugar 4x/day  . acetaminophen (TYLENOL) 500 MG tablet Take 1,000 mg by mouth every 6 (six) hours as needed (for pain.).  Marland Kitchen albuterol (PROVENTIL HFA) 108 (90 Base) MCG/ACT inhaler INHALE 2 PUFFS BY MOUTH EVERY 4 HOURS AS NEEDED FOR COUGHING, WHEEZING, OR SHORTNESS OF BREATH  . Blood Glucose Monitoring Suppl (ACCU-CHEK GUIDE) w/Device KIT 1 each by Does not apply route 3 (three) times daily.  Marland Kitchen buPROPion (WELLBUTRIN SR) 100 MG 12 hr tablet Take 1 tablet (100 mg total) by mouth 2 (two) times daily.  . cyclobenzaprine (FLEXERIL) 5 MG tablet Take 1 tablet (5 mg total) by mouth 3 (three) times daily as needed for muscle spasms.  Marland Kitchen glucose blood (ACCU-CHEK GUIDE) test strip Check blood sugar up to 5 times a day  . icosapent Ethyl (VASCEPA) 1 g capsule Take 2 capsules (2 g total) by mouth 2 (two) times daily.  . insulin lispro (HUMALOG KWIKPEN) 100 UNIT/ML KwikPen Inject 4-9 Units into the skin 3 (three) times daily. Use 4-9U three times daily based on your carb counts and correction scale for a maximum of 24U daily  . Insulin Pen Needle 32G X 4 MM MISC Use to inject insulin 3  times a day  . Insulin Syringe-Needle U-100 31G X 15/64" 0.3 ML MISC Use to inject insulin daily  . megestrol (MEGACE) 40 MG tablet Take 2 tablets (80 mg total) by mouth 2 (two) times daily.  . multivitamin (RENA-VIT) TABS tablet Take 1 tablet by mouth daily.  Marland Kitchen PRESCRIPTION MEDICATION at bedtime.  Marland Kitchen RENAGEL 800 MG tablet Take 4,800 mg by mouth See admin instructions. Take 4800 mg tablets three times daily with meals  . sertraline (ZOLOFT) 100 MG tablet Take 100 mg by mouth daily.  . traMADol (ULTRAM) 50 MG tablet Take 1 tablet (50 mg total) by mouth every 6 (six) hours as needed.  . [DISCONTINUED] amLODipine (NORVASC) 5 MG tablet Take 5 mg by mouth at bedtime.  . [DISCONTINUED] aspirin EC 81 MG tablet Take 1 tablet (81 mg total) by mouth daily.  . [DISCONTINUED] gabapentin (NEURONTIN) 100 MG capsule Take 100 mg by mouth at bedtime.  . [DISCONTINUED] insulin glargine (LANTUS) 100 UNIT/ML injection Inject 0.24 mLs (24 Units total) into the skin at bedtime.    Allergies  Allergen Reactions  . Adhesive [Tape] Other (See Comments)    Irritation     SOCIAL HISTORY/FAMILY HISTORY   Reviewed in Epic:  Pertinent findings:  Social History   Tobacco Use  . Smoking status: Former Smoker    Packs/day: 0.00    Years: 0.50    Pack years: 0.00    Types: Cigarettes    Quit date: 08/29/2015    Years since quitting: 5.4  . Smokeless tobacco: Never Used  Vaping Use  . Vaping Use: Never used  Substance Use Topics  . Alcohol use: Yes    Alcohol/week: 0.0 standard drinks    Comment: occasional  . Drug use: Yes    Types: Marijuana    Comment: 09-08-2020 per pt seldom smokes   Social History   Social History Narrative   Patient does not drink caffeine.  Patient is right handed.       --> 2020 was a difficult year: Her sister died in 03-30-2023 (potentially because of COVID-19), her recent ex-boyfriend died in either 28-Feb-2023 or 2023/03/30, this was followed by one of her close friends being murdered by  the boyfriend.    --> Good news for 2020 was that she had a new grandkids.    OBJCTIVE -PE, EKG, labs   Wt Readings from Last 3 Encounters:  01/29/21 220 lb 1.6 oz (99.8 kg)  01/28/21 220 lb (99.8 kg)  01/07/21 221 lb 9.6 oz (100.5 kg)    Physical Exam: BP 136/64   Pulse 99   Ht $R'5\' 5"'OE$  (1.651 m)   Wt 218 lb (98.9 kg)   SpO2 99%   BMI 36.28 kg/m  Physical Exam Vitals reviewed.  Constitutional:      General: She is not in acute distress.    Appearance: She is ill-appearing (Mild.  This does not look like she feels as good today as she has in the past.). She is not toxic-appearing.     Comments: Well-groomed.  Moderate-morbidly obese.  HENT:     Head: Normocephalic and atraumatic.  Neck:     Vascular: No carotid bruit (Radiating fistula bruit) or JVD.  Cardiovascular:     Rate and Rhythm: Normal rate and regular rhythm. Occasional extrasystoles are present.    Chest Wall: PMI is not displaced.     Pulses: Intact distal pulses. Decreased pulses (Decreased palpable pedal pulses and radial pulse).     Heart sounds: S1 normal and S2 normal. Heart sounds are distant. No murmur (Probably more radiated fistula bruit) heard. No friction rub. Gallop present. S4 sounds present.   Pulmonary:     Effort: Pulmonary effort is normal. No respiratory distress.     Comments: She did have some mild crackles on exam but no rales or rhonchi. Chest:     Chest wall: Tenderness (Mildly tender to touch from coughing.) present.  Musculoskeletal:        General: Swelling (Trace bilateral ankle) present.     Cervical back: Normal range of motion and neck supple.     Right lower leg: Edema (Right foot almost lookS back to normal.) present.  Skin:    General: Skin is warm and dry.  Neurological:     General: No focal deficit present.     Mental Status: She is alert and oriented to person, place, and time.  Psychiatric:        Behavior: Behavior normal.        Thought Content: Thought content  normal.        Judgment: Judgment normal.     Comments: Seems a little bit down today.  Was doing really well until she got the Covid      Adult ECG Report Not checked  Recent Labs:  Will order labs today (lost slip from last visit)   Lab Results  Component Value Date   CREATININE 11.87 (H) 11/29/2018   BUN 54 (H) 11/29/2018   NA 135 11/29/2018   K 4.5 11/29/2018   CL 92 (L) 11/29/2018   CO2 26 11/29/2018   CBC Latest Ref Rng & Units 01/29/2020 09/11/2018 05/28/2018  WBC 3.4 - 10.8 x10E3/uL 5.1 7.9 8.2  Hemoglobin 11.1 - 15.9 g/dL 11.4 11.7(L) 9.9(L)  Hematocrit 34.0 - 46.6 % 36.6 38.2 34.4(L)  Platelets 150 - 450 x10E3/uL 229 274 385    Lab Results  Component Value Date  TSH 0.91 06/15/2016    ==================================================  COVID-19 Education: The signs and symptoms of COVID-19 were discussed with the patient and how to seek care for testing (follow up with PCP or arrange E-visit).   The importance of social distancing and COVID-19 vaccination was discussed today. The patient is practicing social distancing & Masking.   I spent a total of 67minutes with the patient spent in direct patient consultation.  Additional time spent with chart review  / charting (studies, outside notes, etc): 10 min Total Time: 69min    Current medicines are reviewed at length with the patient today.  (+/- concerns) N/A  This visit occurred during the SARS-CoV-2 public health emergency.  Safety protocols were in place, including screening questions prior to the visit, additional usage of staff PPE, and extensive cleaning of exam room while observing appropriate contact time as indicated for disinfecting solutions.  Notice: This dictation was prepared with Dragon dictation along with smaller phrase technology. Any transcriptional errors that result from this process are unintentional and may not be corrected upon review.  Patient Instructions / Medication Changes & Studies  & Tests Ordered   Patient Instructions  Medication Instructions:  Your physician recommends that you continue on your current medications as directed. Please refer to the Current Medication list given to you today.  *If you need a refill on your cardiac medications before your next appointment, please call your pharmacy*  Lab Work: FASTING LP/HFP SOON   If you have labs (blood work) drawn today and your tests are completely normal, you will receive your results only by: Marland Kitchen MyChart Message (if you have MyChart) OR . A paper copy in the mail If you have any lab test that is abnormal or we need to change your treatment, we will call you to review the results.  Testing/Procedures: Your physician has requested that you have an echocardiogram. Echocardiography is a painless test that uses sound waves to create images of your heart. It provides your doctor with information about the size and shape of your heart and how well your heart's chambers and valves are working. This procedure takes approximately one hour. There are no restrictions for this procedure. [place] [address]  Follow-Up: At [facility], you and your health needs are our priority.  As part of our continuing mission to provide you with exceptional heart care, we have created designated Provider Care Teams.  These Care Teams include your primary Cardiologist (physician) and Advanced Practice Providers (APPs -  Physician Assistants and Nurse Practitioners) who all work together to provide you with the care you need, when you need it.  We recommend signing up for the patient portal called "MyChart".  Sign up information is provided on this After Visit Summary.  MyChart is used to connect with patients for Virtual Visits (Telemedicine).  Patients are able to view lab/test results, encounter notes, upcoming appointments, etc.  Non-urgent messages can be sent to your provider as well.   To learn more about what  you can do with MyChart, go to [email or web address].    Your next appointment:   6 month(s)  The format for your next appointment:   In Person  Provider:   You may see Glenetta Hew, MD or one of the following Advanced Practice Providers on your designated Care Team:    Rosaria Ferries, PA-C  Jory Sims, DNP, ANP       Studies Ordered:   Orders Placed This Encounter  Procedures  .  Lipid panel  . Hepatic function panel  . ECHOCARDIOGRAM COMPLETE    Lab Results  Component Value Date   CHOL 130 01/27/2021   HDL 44 01/27/2021   LDLCALC 66 01/27/2021   TRIG 107 01/27/2021   CHOLHDL 3.0 01/27/2021   Lab Results  Component Value Date   ALT 11 01/27/2021   AST 11 01/27/2021   ALKPHOS 94 01/27/2021   BILITOT 0.5 01/27/2021      Glenetta Hew, M.D., M.S. Interventional Cardiologist   Pager # [phone] Phone # [phone] [address] [place], [place] [address]   Thank you for choosing Heart[facility] at [facility]!!

## 2021-01-07 ENCOUNTER — Other Ambulatory Visit: Payer: Self-pay

## 2021-01-07 ENCOUNTER — Ambulatory Visit (INDEPENDENT_AMBULATORY_CARE_PROVIDER_SITE_OTHER): Payer: Medicare Other | Admitting: Internal Medicine

## 2021-01-07 ENCOUNTER — Other Ambulatory Visit: Payer: Self-pay | Admitting: Internal Medicine

## 2021-01-07 ENCOUNTER — Encounter: Payer: Self-pay | Admitting: Internal Medicine

## 2021-01-07 VITALS — BP 143/67 | HR 94 | Temp 98.8°F | Ht 65.0 in | Wt 221.6 lb

## 2021-01-07 DIAGNOSIS — E083413 Diabetes mellitus due to underlying condition with severe nonproliferative diabetic retinopathy with macular edema, bilateral: Secondary | ICD-10-CM | POA: Diagnosis not present

## 2021-01-07 DIAGNOSIS — Z794 Long term (current) use of insulin: Secondary | ICD-10-CM | POA: Diagnosis not present

## 2021-01-07 DIAGNOSIS — M542 Cervicalgia: Secondary | ICD-10-CM | POA: Diagnosis not present

## 2021-01-07 LAB — POCT GLYCOSYLATED HEMOGLOBIN (HGB A1C): Hemoglobin A1C: 9 % — AB (ref 4.0–5.6)

## 2021-01-07 LAB — GLUCOSE, CAPILLARY: Glucose-Capillary: 165 mg/dL — ABNORMAL HIGH (ref 70–99)

## 2021-01-07 MED ORDER — DICLOFENAC SODIUM 1 % EX GEL: 4.0000 g | Freq: Four times a day (QID) | CUTANEOUS | 0 refills | Status: DC

## 2021-01-07 MED FILL — DICLOFENAC SODIUM 1 % GEL: 1 | 13 days supply | Qty: 100 | Fill #0

## 2021-01-07 MED FILL — GABAPENTIN 100 MG CAPSULE: 100 | 30 days supply | Qty: 30 | Fill #2

## 2021-01-07 NOTE — Progress Notes (Signed)
   CC: Type II diabetes mellitus , neck pain   HPI:Jane [name] is a 45 y.o. female who presents for evaluation of type 2 diabetes mellitus and neck pain. Please see individual problem based A/P for details.   Past Medical History:  Diagnosis Date  . Abnormal uterine bleeding (AUB)   . Asthma    prn inhaler  . CAD S/P BMS PCI to prox LAD cardiologist--- dr Beverlyn Roux LAD to Mid LAD lesion, 90% stenosed. Post intervention - Vision BMS 3.0 mm x 18 mm (~3.5 mm) there is a 0% residual stenosis. ;   nuclear stress test-- 09-13-2018 low risk with no ischemia, nuclear ef 56%  . Charcot foot due to diabetes mellitus ([place]) 11/2016   left 2018; right 2019  . Depression   . Diabetic neuropathy (HCC)    feet  . Diabetic retinopathy, nonproliferative, severe ([place])    bilateral  . ESRD (end stage renal disease) on dialysis [facility])    ?chronic interstitial nephritis  [facility]  . H/O non-ST elevation myocardial infarction (NSTEMI) 03/2016   Found in 90% mid LAD lesion treated with bare-metal stent (BMS) PCI - vision BMS 3.0 mm x 18 mm  . History of MRSA infection 2007  . Hyperlipidemia   . Hypertension   . IDA (iron deficiency anemia)   . Insulin dependent type 2 diabetes mellitus ([place])   . Iron deficiency anemia    takes iron supplement  . PAOD (peripheral arterial occlusive disease) ([place])    vascular--- dr Bridgett Larsson  . S/P arteriovenous (AV) fistula creation 07/2017  . S/p bare metal coronary artery stent 03/31/2016   BMS x1  to pLAD  . Sickle cell trait ([place])    Review of Systems:   Review of Systems  Constitutional: Negative for chills and fever.  Musculoskeletal: Positive for joint pain and neck pain. Negative for falls.     Physical Exam: Vitals:   01/07/21 1413  BP: (!) 143/67  Pulse: 94  Temp: 98.8 F (37.1 C)  TempSrc: Oral  SpO2: 100%  Weight: 221 lb 9.6 oz (100.5 kg)  Height: 5\' 5"  (1.651 m)   Physical Exam Constitutional:      General: She  is not in acute distress.    Appearance: She is not ill-appearing.  Cardiovascular:     Rate and Rhythm: Normal rate and regular rhythm.  Pulmonary:     Effort: Pulmonary effort is normal. No respiratory distress.  Abdominal:     General: Bowel sounds are normal.     Tenderness: There is no guarding.  Musculoskeletal:        General: Tenderness (Neck was tender to palpation, no deformity , nl range of motion) present. No swelling or deformity. Normal range of motion.  Neurological:     Sensory: No sensory deficit.     Motor: No weakness.         Assessment & Plan:   See Encounters Tab for problem based charting.  Patient discussed with Dr. Evette Doffing

## 2021-01-07 NOTE — Assessment & Plan Note (Signed)
Patient has intermittent neck pain since Jan 1st.  Appears to be around C4 C5.  No weakness or radiculopathy.  No signs of infection and full range of motion..  Patient counseled on using 1 pillow and removing throw pillows in her bed. Likely strained her paraspinal neck muscles.   - diclofenac Sodium (VOLTAREN) 1 % GEL; Apply 4 g topically 4 (four) times daily.  Dispense: 100 g; Refill: 0

## 2021-01-07 NOTE — Assessment & Plan Note (Signed)
DMII: Hgb A1c 9.5 % at the last visit. Current A1c 9%.  Patient is not checking her blood sugars at home.  We discussed checking her blood sugars and her A1c being falsely low in setting of ESRD.  Encourage patient to check sugars regularly so we can adjust regimen and offer other treatment for her osteoarthritis ( avoiding knee injection due to high risk of infection).  Continue Lantus 20 units daily Continue Novolog 6 units TID Repeat A1c at next visit if after 3 months from this date

## 2021-01-07 NOTE — Patient Instructions (Addendum)
Today we discussed the following:  1. 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 Continue current regimen lantus 20 units and Novolog 6 units TID.   2. Neck pain - diclofenac Sodium (VOLTAREN) 1 % GEL; Apply 4 g topically 4 (four) times daily.  Dispense: 100 g; Refill: 0

## 2021-01-08 NOTE — Progress Notes (Signed)
Internal Medicine Clinic Attending  Case discussed with Dr. Steen  At the time of the visit.  We reviewed the resident's history and exam and pertinent patient test results.  I agree with the assessment, diagnosis, and plan of care documented in the resident's note.  

## 2021-01-15 ENCOUNTER — Ambulatory Visit: Payer: Medicare Other | Admitting: *Deleted

## 2021-01-15 DIAGNOSIS — N186 End stage renal disease: Secondary | ICD-10-CM

## 2021-01-15 DIAGNOSIS — Z794 Long term (current) use of insulin: Secondary | ICD-10-CM

## 2021-01-15 DIAGNOSIS — Z992 Dependence on renal dialysis: Secondary | ICD-10-CM

## 2021-01-15 DIAGNOSIS — E083413 Diabetes mellitus due to underlying condition with severe nonproliferative diabetic retinopathy with macular edema, bilateral: Secondary | ICD-10-CM

## 2021-01-15 DIAGNOSIS — U071 COVID-19: Secondary | ICD-10-CM

## 2021-01-15 DIAGNOSIS — E1169 Type 2 diabetes mellitus with other specified complication: Secondary | ICD-10-CM

## 2021-01-15 DIAGNOSIS — I10 Essential (primary) hypertension: Secondary | ICD-10-CM

## 2021-01-15 NOTE — Progress Notes (Signed)
Internal Medicine Clinic Resident  I have personally reviewed this encounter including the documentation in this note and/or discussed this patient with the care management provider. I will address any urgent items identified by the care management provider and will communicate my actions to the patient's PCP. I have reviewed the patient's CCM visit with my supervising attending, Dr Rebeca Alert.  Mosetta Anis, MD 01/15/2021

## 2021-01-15 NOTE — Chronic Care Management (AMB) (Signed)
Chronic Care Management   CCM RN Visit Note  01/15/2021 Name: Jane [name] MRN: [id number] DOB: December 23, 1975  Subjective: Jane [name] is a 45 y.o. year old female who is a primary care patient of [name], MD. The care management team was consulted for assistance with disease management and care coordination needs.    Engaged with patient by telephone for follow up visit in response to provider referral for case management and/or care coordination services.   Consent to Services:  The patient was given information about Chronic Care Management services, agreed to services, and gave verbal consent prior to initiation of services.  Please see initial visit note for detailed documentation.   Patient agreed to services and verbal consent obtained.   Assessment: Review of patient past medical history, allergies, medications, health status, including review of consultants reports, laboratory and other test data, was performed as part of comprehensive evaluation and provision of chronic care management services.   SDOH (Social Determinants of Health) assessments and interventions performed:    CCM Care Plan  Allergies  Allergen Reactions  . Adhesive [Tape] Other (See Comments)    Irritation     Outpatient Encounter Medications as of 01/15/2021  Medication Sig  . ACCU-CHEK FASTCLIX LANCETS MISC Check blood sugar 4x/day  . acetaminophen (TYLENOL) 500 MG tablet Take 1,000 mg by mouth every 6 (six) hours as needed (for pain.).  Marland Kitchen albuterol (PROVENTIL HFA) 108 (90 Base) MCG/ACT inhaler INHALE 2 PUFFS BY MOUTH EVERY 4 HOURS AS NEEDED FOR COUGHING, WHEEZING, OR SHORTNESS OF BREATH  . amLODipine (NORVASC) 5 MG tablet Take 5 mg by mouth at bedtime.  Marland Kitchen aspirin EC 81 MG tablet Take 1 tablet (81 mg total) by mouth daily.  . Blood Glucose Monitoring Suppl (ACCU-CHEK GUIDE) w/Device KIT 1 each by Does not apply route 3 (three) times daily.  Marland Kitchen buPROPion (WELLBUTRIN SR) 100 MG 12 hr  tablet Take 1 tablet (100 mg total) by mouth 2 (two) times daily.  . cyclobenzaprine (FLEXERIL) 5 MG tablet Take 1 tablet (5 mg total) by mouth 3 (three) times daily as needed for muscle spasms.  . diclofenac Sodium (VOLTAREN) 1 % GEL Apply 4 g topically 4 (four) times daily.  Marland Kitchen gabapentin (NEURONTIN) 100 MG capsule Take 100 mg by mouth at bedtime.  Marland Kitchen glucose blood (ACCU-CHEK GUIDE) test strip Check blood sugar up to 5 times a day  . icosapent Ethyl (VASCEPA) 1 g capsule Take 2 capsules (2 g total) by mouth 2 (two) times daily.  . insulin glargine (LANTUS) 100 UNIT/ML injection Inject 0.24 mLs (24 Units total) into the skin at bedtime.  . insulin lispro (HUMALOG KWIKPEN) 100 UNIT/ML KwikPen Inject 4-9 Units into the skin 3 (three) times daily. Use 4-9U three times daily based on your carb counts and correction scale for a maximum of 24U daily  . Insulin Pen Needle 32G X 4 MM MISC Use to inject insulin 3 times a day  . Insulin Syringe-Needle U-100 31G X 15/64" 0.3 ML MISC Use to inject insulin daily  . megestrol (MEGACE) 40 MG tablet Take 2 tablets (80 mg total) by mouth 2 (two) times daily.  . metoprolol tartrate (LOPRESSOR) 25 MG tablet Take 1 tablet (25 mg total) by mouth 2 (two) times daily. Do not take the night before dialysis, and take 2 hours after dialysis.  Marland Kitchen multivitamin (RENA-VIT) TABS tablet Take 1 tablet by mouth daily.  Marland Kitchen PRESCRIPTION MEDICATION at bedtime.  Marland Kitchen RENAGEL 800 MG tablet Take  4,800 mg by mouth See admin instructions. Take 4800 mg tablets three times daily with meals  . rosuvastatin (CRESTOR) 40 MG tablet Take 1 tablet (40 mg total) by mouth daily. (Patient taking differently: Take 40 mg by mouth at bedtime. )  . sertraline (ZOLOFT) 100 MG tablet Take 100 mg by mouth daily.  . traMADol (ULTRAM) 50 MG tablet Take 1 tablet (50 mg total) by mouth every 6 (six) hours as needed.   No facility-administered encounter medications on file as of 01/15/2021.    Patient Active Problem  List   Diagnosis Date Noted  . Neck pain 01/07/2021  . Chronic diastolic heart failure ([place]) 01/06/2021  . Acute COVID-19 12/16/2020  . Left lateral abdominal pain 02/02/2020  . Ulcer of left foot due to type 2 diabetes mellitus ([place]) 06/03/2019  . Diabetic foot ulcer ([place]) 04/22/2019  . Arthritis of left knee 02/11/2019  . 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 ([place]) 05/23/2018  . DOE (dyspnea on exertion) 02/26/2018  . Hidradenitis suppurativa 02/12/2018  . Anemia in chronic kidney disease 02/12/2018  . Secondary hyperparathyroidism of renal origin ([place]) 01/30/2018  . Coagulation defect, unspecified ([place]) 01/18/2018  . Major depressive disorder 08/11/2017  . Sinus tachycardia 02/23/2017  . ESRD (end stage renal disease) on dialysis ([place]) 01/05/2017  . CAD S/P BMS PCI to prox LAD 03/31/2016  . Atypical angina ([place]) 03/29/2016  . Abnormal uterine bleeding (AUB) 03/17/2016  . PAOD (peripheral arterial occlusive disease) ([place]) 01/08/2016  . Charcot foot due to diabetes mellitus ([place]) 09/07/2015  . Peripheral neuropathy 09/20/2014  . Essential hypertension 08/16/2014  . Seasonal allergic rhinitis 08/16/2014  . Morbid obesity ([place]) 09/26/2012  . Hyperlipidemia associated with type 2 diabetes mellitus ([place]) 02/26/2009  . 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 Asthma 05/08/2007    Conditions to be addressed/monitored:CAD, ESRD with 32 oz fluid restrticiton per day, IDDM, HLD, obesity, depression, Covid + on 12/14/20   Care Plan : CCM RN- General Plan of Care (Adult) for patient with depression and situational grief  Updates made by Barrington Ellison, RN since 01/15/2021 12:00 AM  Completed 01/15/2021  Problem: Symptoms (Depression) Resolved 01/15/2021    Goal: Symptoms Monitored and Managed related to grief from loss of several  family members, boyfriend and friend in short period of  time Completed 01/15/2021  Start Date: 10/30/2020  Expected End Date: 01/15/2021  Recent Progress: On track  Priority: High  Note:   Current Barriers:  . Chronic Disease Management support and education needs related to CAD, ESRD, IDDM, HLD, obesity, depression- patient states she is recovering from Covid infection diagnoses on 12/14/20 and is feeling better although still has dry cough . Unable to independently manage situational depression - patient states with the help of family and friends, she is managing her grief as well as can be expected and she feels motivated ot get her diabetes in control by trying a CGM  Nurse Case Manager Clinical Goal(s):  Marland Kitchen Over the next 30-60  days, patient will work with care team to address needs related to situational depression  Interventions:  . 1:1 collaboration with [name], MD regarding development and update of comprehensive plan of care as evidenced by provider attestation and co-signature . Inter-disciplinary care team collaboration (see longitudinal plan of care) . Evaluation of current treatment plan related to depression and patient's adherence to plan as established by provider. . Encourage ongoing  participation in weekly therapy sessions  Patient Goals/Self-Care Activities Over the next 30-60  days, patient will: - will self administer medications as prescribed - will attend all scheduled provider appointments - will call provider office for new concerns or questions - continue personal counseling - call and visit an old friend - talk about feelings with a friend, family or spiritual advisor - practice positive thinking and self-talk  - keep part time season job to give a reason to get out of the house -have fun with grandchildren Follow Up Plan: The care management team will reach out to the patient again over the next 30-60 days.        Care Plan : CCM RN Diabetes Type 2 (Adult)  Updates made by Barrington Ellison, RN since  01/15/2021 12:00 AM    Problem: Disease Progression (Diabetes, Type 2)     Goal: Disease Progression Prevented or Minimized   Start Date: 01/31/2020  Expected End Date: 05/13/2021  Recent Progress: On track  Priority: Low  Note:   CARE PLAN ENTRY (see longitudinal plan of care for additional care plan information)  Current Barriers:  . Chronic Disease Management support, education, and care coordination needs related to CAD, HTN, HLD, DMII, and ESRD- patient states she is now motivated to achieve better control of her diabetes by trying a CGM, she worries about the placement of the CGM since she has a dialysis access catheter in one arm, she says she wants her chronic  knee pain to be addressed with an injection and understands that if she cannot get her diabetes in better control then she is not a candidate for a steroid injection, she says she tries to take her medications as prescribed but often does not, due to lack of motivation she says her blood pressure is also running high at dialysis, she continues to meet with her counselor in a regular basis.  Clinical Goal(s) related to CAD, HTN, HLD, DMII, and ESRD:  Over the next 30-60 days, patient will:  . Work with the care management team to address educational, disease management, and care coordination needs  . Begin or continue self health monitoring activities as directed today . Call provider office for new or worsened signs and symptoms New or worsened symptom related to DM, HTN, CAD, ESRD . Call care management team with questions or concerns . Verbalize basic understanding of patient centered plan of care established today  Interventions related to CAD, HTN, HLD, DMII, and ESRD:  . Prior to interviewing patient, reviewed patient status, including review of recent office visit notes, consultants reports, relevant laboratory and other test results, and medications, completed. . Assessed patient's self management skills in regards to HTN,  IDDM, HLD, CAD and ESRD and new diagnosis of Covid infection . Evaluation of current treatment plan related to HTN and IDDM, HLD and ESRD and patient's adherence to plan as established by provider. . Reviewed medications with patient and assessed medication taking behavior;  if indicated, discussed importance of medication adherence  . Spent 43 minutes in discussion with patient about the potential benefits of trying a CGM to assist her in improving her glycemic control . Encouraged patient to call the clinic to schedule an appointment with Debera Lat RD, CDCES and/or provider to initiate CGM . Encouraged patient to contact this CCM RN for any questions or concerns that may arise before next month's call  Patient Self Care Activities related to CAD, HTN, HLD, DMII, and ESRD:  . Patient is  unable to independently self-manage chronic health conditions  Please see past updates related to this goal by clicking on the "Past Updates" button in the selected goal       Plan:The care management team will reach out to the patient again over the next 30-60 days.   Kelli Churn RN, CCM, [facility] RN Care Manager [phone]

## 2021-01-15 NOTE — Patient Instructions (Addendum)
Visit Information It was nice speaking with you today. PATIENT GOALS: Patient Care Plan: CCM RN- General Plan of Care (Adult) for patient with depression and situational grief  Completed 01/15/2021  Problem Identified: Symptoms (Depression) Resolved 01/15/2021    Goal: Symptoms Monitored and Managed related to grief from loss of several  family members, boyfriend and friend in short period of time Completed 01/15/2021  Start Date: 10/30/2020  Expected End Date: 01/15/2021  Recent Progress: On track  Priority: High  Note:   Current Barriers:  . Chronic Disease Management support and education needs related to CAD, ESRD, IDDM, HLD, obesity, depression- patient states she is recovering from Covid infection diagnoses on 12/14/20 and is feeling better although still has dry cough . Unable to independently manage situational depression - patient states with the help of family and friends, she is managing her grief as well as can be expected and she feels motivated ot get her diabetes in control by trying a CGM  Nurse Case Manager Clinical Goal(s):  Marland Kitchen Over the next 30-60  days, patient will work with care team to address needs related to situational depression  Interventions:  . 1:1 collaboration with [name], MD regarding development and update of comprehensive plan of care as evidenced by provider attestation and co-signature . Inter-disciplinary care team collaboration (see longitudinal plan of care) . Evaluation of current treatment plan related to depression and patient's adherence to plan as established by provider. . Encourage ongoing participation in weekly therapy sessions  Patient Goals/Self-Care Activities Over the next 30-60  days, patient will: - will self administer medications as prescribed - will attend all scheduled provider appointments - will call provider office for new concerns or questions - continue personal counseling - call and visit an old friend - talk about  feelings with a friend, family or spiritual advisor - practice positive thinking and self-talk  - keep part time season job to give a reason to get out of the house -have fun with grandchildren Follow Up Plan: The care management team will reach out to the patient again over the next 30-60 days.        Patient Care Plan: CCM RN Diabetes Type 2 (Adult)    Problem Identified: Disease Progression (Diabetes, Type 2)     Goal: Disease Progression Prevented or Minimized   Start Date: 01/31/2020  Expected End Date: 05/13/2021  Recent Progress: On track  Priority: Low  Note:   CARE PLAN ENTRY (see longitudinal plan of care for additional care plan information)  Current Barriers:  . Chronic Disease Management support, education, and care coordination needs related to CAD, HTN, HLD, DMII, and ESRD- patient states she is now motivated to achieve better control of her diabetes by trying a CGM, she worries about the placement of the CGM since she has a dialysis access catheter in one arm, she says she wants her chronic  knee pain to be addressed with an injection and understands that if she cannot get her diabetes in better control then she is not a candidate for a steroid injection, she says she tries to take her medications as prescribed but often does not, due to lack of motivation she says her blood pressure is also running high at dialysis, she continues to meet with her counselor in a regular basis.  Clinical Goal(s) related to CAD, HTN, HLD, DMII, and ESRD:  Over the next 30-60 days, patient will:  . Work with the care management team to address educational, disease management,  and care coordination needs  . Begin or continue self health monitoring activities as directed today . Call provider office for new or worsened signs and symptoms New or worsened symptom related to DM, HTN, CAD, ESRD . Call care management team with questions or concerns . Verbalize basic understanding of patient  centered plan of care established today  Interventions related to CAD, HTN, HLD, DMII, and ESRD:  . Prior to interviewing patient, reviewed patient status, including review of recent office visit notes, consultants reports, relevant laboratory and other test results, and medications, completed. . Assessed patient's self management skills in regards to HTN, IDDM, HLD, CAD and ESRD and new diagnosis of Covid infection . Evaluation of current treatment plan related to HTN and IDDM, HLD and ESRD and patient's adherence to plan as established by provider. . Reviewed medications with patient and assessed medication taking behavior;  if indicated, discussed importance of medication adherence  . Spent 43 minutes in discussion with patient about the potential benefits of trying a CGM to assist her in improving her glycemic control . Encouraged patient to call the clinic to schedule an appointment with Debera Lat RD, CDCES and/or provider to initiate CGM . Encouraged patient to contact this CCM RN for any questions or concerns that may arise before next month's call  Patient Self Care Activities related to CAD, HTN, HLD, DMII, and ESRD:  . Patient is unable to independently self-manage chronic health conditions  Please see past updates related to this goal by clicking on the "Past Updates" button in the selected goal       The patient verbalized understanding of instructions, educational materials, and care plan provided today and declined offer to receive copy of patient instructions, educational materials, and care plan.   The care management team will reach out to the patient again over the next 30-60 days.   Kelli Churn RN, CCM, [facility] RN Care Manager [phone]

## 2021-01-19 NOTE — Progress Notes (Signed)
Internal Medicine Clinic Attending  CCM services provided by the care management provider and their documentation were reviewed with Dr. Truman Hayward.  We reviewed the pertinent findings, urgent action items addressed by the resident and non-urgent items to be addressed by the PCP.  I agree with the assessment, diagnosis, and plan of care documented in the CCM and resident's note.  Oda Kilts, MD 01/19/2021

## 2021-01-27 ENCOUNTER — Other Ambulatory Visit (HOSPITAL_COMMUNITY): Payer: Medicare Other

## 2021-01-27 ENCOUNTER — Encounter (HOSPITAL_COMMUNITY): Payer: Self-pay | Admitting: Cardiology

## 2021-01-27 LAB — HEPATIC FUNCTION PANEL
ALT: 11 IU/L (ref 0–32)
AST: 11 IU/L (ref 0–40)
Albumin: 4.2 g/dL (ref 3.8–4.8)
Alkaline Phosphatase: 94 IU/L (ref 44–121)
Bilirubin Total: 0.5 mg/dL (ref 0.0–1.2)
Bilirubin, Direct: 0.17 mg/dL (ref 0.00–0.40)
Total Protein: 6.9 g/dL (ref 6.0–8.5)

## 2021-01-27 LAB — LIPID PANEL
Chol/HDL Ratio: 3 ratio (ref 0.0–4.4)
Cholesterol, Total: 130 mg/dL (ref 100–199)
HDL: 44 mg/dL (ref >39)
LDL Chol Calc (NIH): 66 mg/dL (ref 0–99)
Triglycerides: 107 mg/dL (ref 0–149)
VLDL Cholesterol Cal: 20 mg/dL (ref 5–40)

## 2021-01-27 NOTE — Progress Notes (Unsigned)
Patient ID: Jane [name], female   DOB: 04-17-1976, 45 y.o.   MRN: [id number]  Verified appointment "no show" status with Leanne at 7:31am.

## 2021-01-28 ENCOUNTER — Encounter: Payer: Self-pay | Admitting: Obstetrics & Gynecology

## 2021-01-28 ENCOUNTER — Ambulatory Visit (INDEPENDENT_AMBULATORY_CARE_PROVIDER_SITE_OTHER): Payer: Medicare Other | Admitting: Obstetrics & Gynecology

## 2021-01-28 ENCOUNTER — Ambulatory Visit: Payer: Medicare Other | Admitting: Obstetrics & Gynecology

## 2021-01-28 ENCOUNTER — Other Ambulatory Visit: Payer: Self-pay | Admitting: Obstetrics & Gynecology

## 2021-01-28 ENCOUNTER — Other Ambulatory Visit: Payer: Self-pay

## 2021-01-28 VITALS — BP 164/86 | HR 105 | Ht 65.0 in | Wt 220.0 lb

## 2021-01-28 DIAGNOSIS — N939 Abnormal uterine and vaginal bleeding, unspecified: Secondary | ICD-10-CM | POA: Diagnosis not present

## 2021-01-28 DIAGNOSIS — B3731 Acute candidiasis of vulva and vagina: Secondary | ICD-10-CM

## 2021-01-28 DIAGNOSIS — B373 Candidiasis of vulva and vagina: Secondary | ICD-10-CM | POA: Diagnosis not present

## 2021-01-28 DIAGNOSIS — Z30431 Encounter for routine checking of intrauterine contraceptive device: Secondary | ICD-10-CM | POA: Diagnosis not present

## 2021-01-28 MED ORDER — FLUCONAZOLE 150 MG PO TABS: 150.0000 mg | ORAL_TABLET | ORAL | 3 refills | Status: DC

## 2021-01-28 NOTE — Progress Notes (Signed)
  GYNECOLOGY OFFICE ENCOUNTER NOTE  History:  45 y.o. [id number] here today for today for IUD string check; Liletta  IUD was placed 12/29/2019. No complaints about the IUD, no concerning side effects. Pt does report bleeding and itching from the Vulva. The IUD was placed due to AUB. The pt reports that she did not stop or wean the Megace as recommended and she is still taking 2 daily. Her bleeding has improved.   The following portions of the patient's history were reviewed and updated as appropriate: allergies, current medications, past family history, past medical history, past social history, past surgical history and problem list. Last pap smear on 05/20/2020 was normal, negative HRHPV.  Review of Systems:  Pertinent items are noted in HPI.   Objective:  Physical Exam Blood pressure (!) 164/86, pulse (!) 105, height 5\' 5"  (1.651 m), weight 220 lb (99.8 kg). CONSTITUTIONAL: Well-developed, well-nourished female in no acute distress.  HENT:  Normocephalic, atraumatic. External right and left ear normal. Oropharynx is clear and moist EYES: Conjunctivae and EOM are normal. Pupils are equal, round, and reactive to light. No scleral icterus.  NECK: Normal range of motion, supple, no masses CARDIOVASCULAR: Normal heart rate noted RESPIRATORY: Effort and breath sounds normal, no problems with respiration noted ABDOMEN: Soft, no distention noted.   PELVIC: Pt has skin changes consistent with yeast on upper thigh and vulva. There are a few excoriations that appear to be from scratchign and the pt confirms that theory. Normal appearing vaginal mucosa and cervix.  IUD strings visualized, about 0.5 cm in length outside cervix.   Assessment & Plan:  Patient to keep IUD in place for up to seven years; can come in for removal if she desires pregnancy earlier or for any concerning side effects. Diflucan 150mg  1 po every 3 days x 1 Decrease the Megace to 40mg  daily for 4 weeks then stop  F/u in 3 months or  sooner prn    Yuleni Burich L. Harraway-Smith, MD, [place], [facility] for [employer], [place]

## 2021-01-28 NOTE — Progress Notes (Signed)
Pt presents today for IUD string check. Pt had Liletta IUD placed on 12/28/20. Pt is still having some slight bleeding since insertion. Pt states she is having irritation on the outside since she is having to wear a pad consistently.

## 2021-01-28 NOTE — Patient Instructions (Signed)
Levonorgestrel intrauterine device (IUD) What is this medicine? LEVONORGESTREL IUD (LEE voe nor jes trel) is a contraceptive (birth control) device. The device is placed inside the uterus by a health care provider. It is used to prevent pregnancy. Some devices can also be used to treat heavy bleeding that occurs during your period. This medicine may be used for other purposes; ask your health care provider or pharmacist if you have questions. COMMON BRAND NAME(S): Kyleena, LILETTA, Mirena, Skyla What should I tell my health care provider before I take this medicine? They need to know if you have any of these conditions:  abnormal Pap smear  cancer of the breast, uterus, or cervix  diabetes  endometritis  genital or pelvic infection now or in the past  have more than one sexual partner or your partner has more than one partner  heart disease  history of an ectopic or tubal pregnancy  immune system problems  IUD in place  liver disease or tumor  problems with blood clots or take blood-thinners  seizures  use intravenous drugs  uterus of unusual shape  vaginal bleeding that has not been explained  an unusual or allergic reaction to levonorgestrel, other hormones, silicone, or polyethylene, medicines, foods, dyes, or preservatives  pregnant or trying to get pregnant  breast-feeding How should I use this medicine? This device is placed inside the uterus by a health care professional. Talk to your pediatrician regarding the use of this medicine in children. Special care may be needed. Overdosage: If you think you have taken too much of this medicine contact a poison control center or emergency room at once. NOTE: This medicine is only for you. Do not share this medicine with others. What if I miss a dose? This does not apply. Depending on the brand of device you have inserted, the device will need to be replaced every 3 to 7 years if you wish to continue using this type  of birth control. What may interact with this medicine? Do not take this medicine with any of the following medications:  amprenavir  bosentan  fosamprenavir This medicine may also interact with the following medications:  aprepitant  armodafinil  barbiturate medicines for inducing sleep or treating seizures  bexarotene  boceprevir  griseofulvin  medicines to treat seizures like carbamazepine, ethotoin, felbamate, oxcarbazepine, phenytoin, topiramate  modafinil  pioglitazone  rifabutin  rifampin  rifapentine  some medicines to treat HIV infection like atazanavir, efavirenz, indinavir, lopinavir, nelfinavir, tipranavir, ritonavir  St. John's wort  warfarin This list may not describe all possible interactions. Give your health care provider a list of all the medicines, herbs, non-prescription drugs, or dietary supplements you use. Also tell them if you smoke, drink alcohol, or use illegal drugs. Some items may interact with your medicine. What should I watch for while using this medicine? Visit your doctor or health care professional for regular check ups. See your doctor if you or your partner has sexual contact with others, becomes HIV positive, or gets a sexual transmitted disease. This product does not protect you against HIV infection (AIDS) or other sexually transmitted diseases. You can check the placement of the IUD yourself by reaching up to the top of your vagina with clean fingers to feel the threads. Do not pull on the threads. It is a good habit to check placement after each menstrual period. Call your doctor right away if you feel more of the IUD than just the threads or if you cannot feel the threads   at all. The IUD may come out by itself. You may become pregnant if the device comes out. If you notice that the IUD has come out use a backup birth control method like condoms and call your health care provider. Using tampons will not change the position of the  IUD and are okay to use during your period. This IUD can be safely scanned with magnetic resonance imaging (MRI) only under specific conditions. Before you have an MRI, tell your healthcare provider that you have an IUD in place, and which type of IUD you have in place. What side effects may I notice from receiving this medicine? Side effects that you should report to your doctor or health care professional as soon as possible:  allergic reactions like skin rash, itching or hives, swelling of the face, lips, or tongue  fever, flu-like symptoms  genital sores  high blood pressure  no menstrual period for 6 weeks during use  pain, swelling, warmth in the leg  pelvic pain or tenderness  severe or sudden headache  signs of pregnancy  stomach cramping  sudden shortness of breath  trouble with balance, talking, or walking  unusual vaginal bleeding, discharge  yellowing of the eyes or skin Side effects that usually do not require medical attention (report to your doctor or health care professional if they continue or are bothersome):  acne  breast pain  change in sex drive or performance  changes in weight  cramping, dizziness, or faintness while the device is being inserted  headache  irregular menstrual bleeding within first 3 to 6 months of use  nausea This list may not describe all possible side effects. Call your doctor for medical advice about side effects. You may report side effects to FDA at 1-800-FDA-1088. Where should I keep my medicine? This does not apply. NOTE: This sheet is a summary. It may not cover all possible information. If you have questions about this medicine, talk to your doctor, pharmacist, or health care provider.  2021 Elsevier/Gold Standard (2020-06-30 16:27:45)  

## 2021-01-29 ENCOUNTER — Encounter: Payer: Self-pay | Admitting: Student

## 2021-01-29 ENCOUNTER — Other Ambulatory Visit: Payer: Self-pay | Admitting: Student

## 2021-01-29 ENCOUNTER — Other Ambulatory Visit: Payer: Self-pay

## 2021-01-29 ENCOUNTER — Ambulatory Visit (INDEPENDENT_AMBULATORY_CARE_PROVIDER_SITE_OTHER): Payer: Medicare Other | Admitting: Student

## 2021-01-29 VITALS — BP 142/79 | HR 95 | Temp 98.8°F | Ht 65.0 in | Wt 220.1 lb

## 2021-01-29 DIAGNOSIS — Z794 Long term (current) use of insulin: Secondary | ICD-10-CM

## 2021-01-29 DIAGNOSIS — E113493 Type 2 diabetes mellitus with severe nonproliferative diabetic retinopathy without macular edema, bilateral: Secondary | ICD-10-CM | POA: Diagnosis not present

## 2021-01-29 DIAGNOSIS — L97529 Non-pressure chronic ulcer of other part of left foot with unspecified severity: Secondary | ICD-10-CM

## 2021-01-29 DIAGNOSIS — I208 Other forms of angina pectoris: Secondary | ICD-10-CM

## 2021-01-29 DIAGNOSIS — E1142 Type 2 diabetes mellitus with diabetic polyneuropathy: Secondary | ICD-10-CM

## 2021-01-29 DIAGNOSIS — I10 Essential (primary) hypertension: Secondary | ICD-10-CM

## 2021-01-29 DIAGNOSIS — M1712 Unilateral primary osteoarthritis, left knee: Secondary | ICD-10-CM

## 2021-01-29 DIAGNOSIS — E11621 Type 2 diabetes mellitus with foot ulcer: Secondary | ICD-10-CM

## 2021-01-29 DIAGNOSIS — E11610 Type 2 diabetes mellitus with diabetic neuropathic arthropathy: Secondary | ICD-10-CM

## 2021-01-29 DIAGNOSIS — E1140 Type 2 diabetes mellitus with diabetic neuropathy, unspecified: Secondary | ICD-10-CM | POA: Insufficient documentation

## 2021-01-29 MED ORDER — BLOOD GLUCOSE METER KIT: PACK | 0 refills | Status: DC

## 2021-01-29 MED ORDER — GABAPENTIN 100 MG PO CAPS: 100.0000 mg | ORAL_CAPSULE | Freq: Every day | ORAL | 0 refills | Status: DC

## 2021-01-29 MED ORDER — ASPIRIN EC 81 MG PO TBEC: 81.0000 mg | DELAYED_RELEASE_TABLET | Freq: Every day | ORAL | 3 refills | Status: DC

## 2021-01-29 MED ORDER — AMLODIPINE BESYLATE 5 MG PO TABS: 5.0000 mg | ORAL_TABLET | Freq: Every day | ORAL | 0 refills | Status: DC

## 2021-01-29 MED ORDER — INSULIN GLARGINE 100 UNIT/ML ~~LOC~~ SOLN: 24.0000 [IU] | Freq: Every day | SUBCUTANEOUS | 3 refills | Status: DC

## 2021-01-29 NOTE — Assessment & Plan Note (Signed)
Patient previously seen by podiatrist, but has not followed up in "years," requesting new referral today. - Podiatry referral placed

## 2021-01-29 NOTE — Assessment & Plan Note (Signed)
BP Readings from Last 3 Encounters:  01/29/21 (!) 142/79  01/28/21 (!) 164/86  01/07/21 (!) 143/67   Patient reports she has not taken any of her medications in a "long time." Mentions prior to dialysis sessions SBP often >200. Today presents after dialysis session earlier this morning. Will have her re-start amlodipine today, can titrate up at next clinic visit. - Re-start amlodipine 5mg  qd

## 2021-01-29 NOTE — Assessment & Plan Note (Signed)
Patient reporting worsening L knee pain, especially with ambulation. Discussed with patient that ultimately steroid injections and possibly surgery would be beneficial, but give her co-morbidities those are not possible. Patient to follow-up with the pain clinic to discuss further options for pain control.

## 2021-01-29 NOTE — Assessment & Plan Note (Signed)
Last A1c 9% last month during visit, although she has ESRD on MWF HD. She reports today she has not checked her sugars or taken her insulin "in a long time." States she is not sure her insulin is good anymore.   A/P: Had long discussion regarding importance of glycemic control with [name]. She is agreeable to meet with Barry Brunner for CGM placement and further diabetes education. Will have prescribe new glucometer for patient, as she says she lost her previous one. Discussed that I would like her to take her sugars for a few days prior to starting her insulin back. Patient to call the clinic next week to discuss starting her insulin back. - Glucometer ordered - Diabetes educator referral - F/u with clinic to discuss insulin regimen prior to re-starting to avoid hypoglycemic episodes

## 2021-01-29 NOTE — Patient Instructions (Addendum)
[name]  It was a pleasure seeing you today!  Today we discussed your diabetes, blood pressure, and foot pain.  I have re-started your aspirin and amlodipine for blood pressure. Make sure to take these daily. We will also re-start your gabapentin to take once nightly.  I have also placed a referral for the podiatrist and to meet with Ms. Plyer for CGM.  We look forward to seeing you next time. Please call our clinic at [phone] if you have any questions or concerns. The best time to call is Monday-Friday from 9am-4pm, but there is someone available 24/7 at the same number. If you need medication refills, please notify your pharmacy one week in advance and they will send [place] a request.  Thank you for letting us take part in your care. Wishing you the best!  Thank you, Dr. Sanjuan Dame, MD

## 2021-01-29 NOTE — Assessment & Plan Note (Signed)
Patient reports chronic burning sensation on bilateral feet. Previously was prescribed gabapentin but was stopped. Discussed with patient that options are limited given her ESRD, but will start with daily gabapentin at night.  - Re-start gabapentin 100mg  QHS

## 2021-01-29 NOTE — Progress Notes (Signed)
   CC: diabetes, blood pressure, knee pain  HPI:  [name] is a 45 y.o. with medical history as below presenting to [facility] for follow-up for diabetes, blood pressure, and discuss L knee pain.  Please see problem-based list for further details, assessments, and plans.  Past Medical History:  Diagnosis Date  . Abnormal uterine bleeding (AUB)   . Asthma    prn inhaler  . CAD S/P BMS PCI to prox LAD cardiologist--- dr Beverlyn Roux LAD to Mid LAD lesion, 90% stenosed. Post intervention - Vision BMS 3.0 mm x 18 mm (~3.5 mm) there is a 0% residual stenosis. ;   nuclear stress test-- 09-13-2018 low risk with no ischemia, nuclear ef 56%  . Charcot foot due to diabetes mellitus ([place]) 11/2016   left 2018; right 2019  . Depression   . Diabetic neuropathy (HCC)    feet  . Diabetic retinopathy, nonproliferative, severe ([place])    bilateral  . ESRD (end stage renal disease) on dialysis [facility])    ?chronic interstitial nephritis  [facility]  . H/O non-ST elevation myocardial infarction (NSTEMI) 03/2016   Found in 90% mid LAD lesion treated with bare-metal stent (BMS) PCI - vision BMS 3.0 mm x 18 mm  . History of MRSA infection 2007  . Hyperlipidemia   . Hypertension   . IDA (iron deficiency anemia)   . Insulin dependent type 2 diabetes mellitus ([place])   . Iron deficiency anemia    takes iron supplement  . PAOD (peripheral arterial occlusive disease) ([place])    vascular--- dr Bridgett Larsson  . S/P arteriovenous (AV) fistula creation 07/2017  . S/p bare metal coronary artery stent 03/31/2016   BMS x1  to pLAD  . Sickle cell trait ([place])    Review of Systems:  As per HPI  Physical Exam:  Vitals:   01/29/21 0923  BP: (!) 142/79  Pulse: 95  Temp: 98.8 F (37.1 C)  TempSrc: Oral  SpO2: 95%  Weight: 220 lb 1.6 oz (99.8 kg)  Height: 5\' 5"  (1.651 m)   General: Pleasant, non ill-appearing, no acute distress CV: Regular rate, rhythm. No m/r/g appreciated. Distal pulses 2+  bilaterally. Pulm: Normal work of breathing. Clear to auscultation bilaterally.  MSK: No pitting edema bilaterally. Normal bulk, tone.   Assessment & Plan:   See Encounters Tab for problem based charting.  Patient discussed with Dr. Rebeca Alert

## 2021-01-31 ENCOUNTER — Encounter: Payer: Self-pay | Admitting: Cardiology

## 2021-01-31 NOTE — Assessment & Plan Note (Signed)
She did not get lipids checked last time.  Will order now.  Continue current dose of rosuvastatin and Vascepa.Marland Kitchen

## 2021-01-31 NOTE — Assessment & Plan Note (Signed)
More symptomatic than usual, I think is probably because of the Covid infection and missing a few days of dialysis.  She may still be volume overloaded.  She does not do well with lots of volume removal.  Suspect that she will start feeling better over the next couple weeks.  Next Plan: Check 2D echo. Cannot really be all that much more aggressive with blood pressure control than she is currently because of hypotension in dialysis.

## 2021-01-31 NOTE — Assessment & Plan Note (Signed)
She is lost some weight.  Still needs lose more.  No longer in the morbidly obese category.

## 2021-01-31 NOTE — Assessment & Plan Note (Signed)
Blood pressures are very labile, all over the place.  I think this point we need to allow her pressures to be okay because they go down during dialysis and are very high predialysis. I do want her to stay on amlodipine and metoprolol.

## 2021-01-31 NOTE — Assessment & Plan Note (Signed)
Her presentation for MI was with atypical type of anginal symptoms. Most recent Myoview was in 2019, nonischemic.  She remains on amlodipine and stable dose of metoprolol.  Not requiring.  Nitroglycerin.

## 2021-01-31 NOTE — Assessment & Plan Note (Signed)
Ostial mid LAD BMS PCI in the setting of non-STEMI (BMS used because of need for potential surgery).  Remains on aspirin now with Thienopyridine.  Plan:  She is on beta-blocker and calcium channel blocker stable dose.  Can titrate up further because of hypotension during dialysis.  She is on Vascepa and Crestor.  With her having little more than usual dyspnea on exertion and orthopnea, we will recheck an echocardiogram just to make sure that there is been no change of EF.

## 2021-01-31 NOTE — Assessment & Plan Note (Signed)
Continue beta-blocker.  I suspect we may need to titrate this up further.

## 2021-02-09 ENCOUNTER — Ambulatory Visit (INDEPENDENT_AMBULATORY_CARE_PROVIDER_SITE_OTHER): Payer: Medicare Other | Admitting: Podiatry

## 2021-02-09 ENCOUNTER — Encounter: Payer: Self-pay | Admitting: Podiatry

## 2021-02-09 ENCOUNTER — Other Ambulatory Visit: Payer: Self-pay

## 2021-02-09 DIAGNOSIS — M2141 Flat foot [pes planus] (acquired), right foot: Secondary | ICD-10-CM

## 2021-02-09 DIAGNOSIS — Z794 Long term (current) use of insulin: Secondary | ICD-10-CM

## 2021-02-09 DIAGNOSIS — Z992 Dependence on renal dialysis: Secondary | ICD-10-CM

## 2021-02-09 DIAGNOSIS — G63 Polyneuropathy in diseases classified elsewhere: Secondary | ICD-10-CM

## 2021-02-09 DIAGNOSIS — M2142 Flat foot [pes planus] (acquired), left foot: Secondary | ICD-10-CM

## 2021-02-09 DIAGNOSIS — N186 End stage renal disease: Secondary | ICD-10-CM | POA: Diagnosis not present

## 2021-02-09 DIAGNOSIS — M216X9 Other acquired deformities of unspecified foot: Secondary | ICD-10-CM

## 2021-02-09 DIAGNOSIS — E11610 Type 2 diabetes mellitus with diabetic neuropathic arthropathy: Secondary | ICD-10-CM

## 2021-02-09 DIAGNOSIS — E083413 Diabetes mellitus due to underlying condition with severe nonproliferative diabetic retinopathy with macular edema, bilateral: Secondary | ICD-10-CM

## 2021-02-09 NOTE — Patient Instructions (Addendum)
[place] Diabetic Foot Lotion   Diabetes Mellitus and Foot Care Foot care is an important part of your health, especially when you have diabetes. Diabetes may cause you to have problems because of poor blood flow (circulation) to your feet and legs, which can cause your skin to:  Become thinner and drier.  Break more easily.  Heal more slowly.  Peel and crack. You may also have nerve damage (neuropathy) in your legs and feet, causing decreased feeling in them. This means that you may not notice minor injuries to your feet that could lead to more serious problems. Noticing and addressing any potential problems early is the best way to prevent future foot problems. How to care for your feet Foot hygiene  Wash your feet daily with warm water and mild soap. Do not use hot water. Then, pat your feet and the areas between your toes until they are completely dry. Do not soak your feet as this can dry your skin.  Trim your toenails straight across. Do not dig under them or around the cuticle. File the edges of your nails with an emery board or nail file.  Apply a moisturizing lotion or petroleum jelly to the skin on your feet and to dry, brittle toenails. Use lotion that does not contain alcohol and is unscented. Do not apply lotion between your toes.   Shoes and socks  Wear clean socks or stockings every day. Make sure they are not too tight. Do not wear knee-high stockings since they may decrease blood flow to your legs.  Wear shoes that fit properly and have enough cushioning. Always look in your shoes before you put them on to be sure there are no objects inside.  To break in new shoes, wear them for just a few hours a day. This prevents injuries on your feet. Wounds, scrapes, corns, and calluses  Check your feet daily for blisters, cuts, bruises, sores, and redness. If you cannot see the bottom of your feet, use a mirror or ask someone for help.  Do not cut corns or calluses or try to  remove them with medicine.  If you find a minor scrape, cut, or break in the skin on your feet, keep it and the skin around it clean and dry. You may clean these areas with mild soap and water. Do not clean the area with peroxide, alcohol, or iodine.  If you have a wound, scrape, corn, or callus on your foot, look at it several times a day to make sure it is healing and not infected. Check for: ? Redness, swelling, or pain. ? Fluid or blood. ? Warmth. ? Pus or a bad smell.   General tips  Do not cross your legs. This may decrease blood flow to your feet.  Do not use heating pads or hot water bottles on your feet. They may burn your skin. If you have lost feeling in your feet or legs, you may not know this is happening until it is too late.  Protect your feet from hot and cold by wearing shoes, such as at the beach or on hot pavement.  Schedule a complete foot exam at least once a year (annually) or more often if you have foot problems. Report any cuts, sores, or bruises to your health care provider immediately. Where to find more information  American Diabetes Association: www.diabetes.org  Association of Diabetes Care & Education Specialists: www.diabeteseducator.org Contact a health care provider if:  You have a medical condition  that increases your risk of infection and you have any cuts, sores, or bruises on your feet.  You have an injury that is not healing.  You have redness on your legs or feet.  You feel burning or tingling in your legs or feet.  You have pain or cramps in your legs and feet.  Your legs or feet are numb.  Your feet always feel cold.  You have pain around any toenails. Get help right away if:  You have a wound, scrape, corn, or callus on your foot and: ? You have pain, swelling, or redness that gets worse. ? You have fluid or blood coming from the wound, scrape, corn, or callus. ? Your wound, scrape, corn, or callus feels warm to the touch. ? You  have pus or a bad smell coming from the wound, scrape, corn, or callus. ? You have a fever. ? You have a red line going up your leg. Summary  Check your feet every day for blisters, cuts, bruises, sores, and redness.  Apply a moisturizing lotion or petroleum jelly to the skin on your feet and to dry, brittle toenails.  Wear shoes that fit properly and have enough cushioning.  If you have foot problems, report any cuts, sores, or bruises to your health care provider immediately.  Schedule a complete foot exam at least once a year (annually) or more often if you have foot problems. This information is not intended to replace advice given to you by your health care provider. Make sure you discuss any questions you have with your health care provider. Document Revised: 05/21/2020 Document Reviewed: 05/21/2020 Elsevier Patient Education  [place].

## 2021-02-09 NOTE — Progress Notes (Signed)
  Subjective:  Patient ID: Jane [name], female    DOB: 11-11-1976,  MRN: [id number]  Chief Complaint  Patient presents with  . Diabetes    Diabetic foot exam     45 y.o. female presents with the above complaint. History confirmed with patient.  She is a history of Charcot arthropathy with multiple surgery with Dr. Doran Durand as well as previous ulcerations treated the wound care center.  Currently no ulceration active.  She is referred for diabetic foot examination.  Objective:  Physical Exam: warm, good capillary refill, no trophic changes or ulcerative lesions and normal DP and PT pulses.  Absent protective sensation.  She is rocker-bottom foot deformity bilaterally with no evidence of ulceration or preulcerative callus.  Xerosis cutis noted.  Assessment:   1. Polyneuropathy associated with underlying disease ([place])   2. 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 ESRD (end stage renal disease) on dialysis ([place])   4. 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 Pes planus of both feet   6. Rocker bottom foot, acquired, unspecified laterality      Plan:  Patient was evaluated and treated and all questions answered.  Patient educated on diabetes. Discussed proper diabetic foot care and discussed risks and complications of disease. Educated patient in depth on reasons to return to the office immediately should he/she discover anything concerning or new on the feet. All questions answered. Discussed proper shoes as well.   Recommendations given on skin care regimen for people with diabetes  Thank you benefit from diabetic extra-depth shoes with custom molded inserts to offload the plantar foot from her neuropathic deformity.  We will call her to schedule this.  Return in about 1 year (around 02/09/2022) for diabetic foot exam .

## 2021-02-10 ENCOUNTER — Other Ambulatory Visit (HOSPITAL_COMMUNITY): Payer: Self-pay | Admitting: Family Medicine

## 2021-02-10 MED FILL — OXYCODONE-APAP 10-325: 10-325 | 15 days supply | Qty: 60 | Fill #0

## 2021-02-12 ENCOUNTER — Ambulatory Visit (INDEPENDENT_AMBULATORY_CARE_PROVIDER_SITE_OTHER): Payer: Medicare Other | Admitting: Dietician

## 2021-02-12 ENCOUNTER — Encounter: Payer: Self-pay | Admitting: Dietician

## 2021-02-12 ENCOUNTER — Other Ambulatory Visit: Payer: Self-pay | Admitting: Dietician

## 2021-02-12 ENCOUNTER — Encounter: Payer: Medicare Other | Admitting: Dietician

## 2021-02-12 DIAGNOSIS — Z794 Long term (current) use of insulin: Secondary | ICD-10-CM

## 2021-02-12 DIAGNOSIS — Z713 Dietary counseling and surveillance: Secondary | ICD-10-CM

## 2021-02-12 DIAGNOSIS — E113493 Type 2 diabetes mellitus with severe nonproliferative diabetic retinopathy without macular edema, bilateral: Secondary | ICD-10-CM

## 2021-02-12 DIAGNOSIS — E083413 Diabetes mellitus due to underlying condition with severe nonproliferative diabetic retinopathy with macular edema, bilateral: Secondary | ICD-10-CM | POA: Diagnosis not present

## 2021-02-12 HISTORY — PX: TRANSTHORACIC ECHOCARDIOGRAM: SHX275

## 2021-02-12 NOTE — Telephone Encounter (Signed)
Jane [name] wants to use insulin pens instead of vials and would prefer to use Jane [name] Flextouch pens rather than Lantus Solostar. Jane [name] is preferable for her with a lower hypoglycemia rate and she has a history of severe hypoglycemia. Please change to Jane [name] Flextouch pens if you agree. She has a Dexcom CGM on present and transition to a different insulin will be able to be monitored.

## 2021-02-12 NOTE — Patient Instructions (Signed)
I will send your information in to [place]. They will try to contact you to get your permission to mail more sensors out. Patient support [phone] and [phone]   If you are abel to connect. Please contact me by phone. [phone]  I will ask for strips. Lancets, Tresiba pens to replace your Lantus.   I suggest you take the Humalog instead of the Novolog.   Call with questions of concerns and see you on  April 18thj.  [name] [phone]

## 2021-02-12 NOTE — Progress Notes (Signed)
Diabetes Self-Management Education  Visit Type: First/Initial  Appt. Start Time: 1045 Appt. End Time: 1210  02/12/2021  [name], identified by name and date of birth, is a 45 y.o. female with a diagnosis of Diabetes: Type 2.   ASSESSMENT [place] wants help to get her diabetes controlled. Says she knows this can help some of her other problems. We discussed how to eat for both her diabetes and kidney disease. Refer for Medical Nutrition Therapy as needed. Continuous glucose monitoring Application to [facility] prepared and put in IMP yellow box today  Would like to use insulin pens and increase pen needles tresiba pens preferable  She liked victoza does not know why it was stopped accu chek guide softclix lancets provided to patient today.  Did not weigh today, said she weighs at dialysis. Started her on a Dexcom G6 sample using her phone as the receiver. Dexcom G6 Personal CGM Training  Jane [name] was educated about the following:  -Getting to know device    (:Phone programmed ) -Setting up device (high alert 250  , low alert 85  ) -Rise alert set at   NA mg/dL  -Fall alert set at    NA  mg/dL  -Setting alert profile -Inserting sensor in upper right abdomen -Calibrating- none required for G6 -Ending sensor session -Trouble shooting, clarity information    Patient has [facility] tech support and my contact information.    Diabetes Self-Management Education - 02/12/21 1500      Visit Information   Visit Type First/Initial      Initial Visit   Diabetes Type Type 2    Are you currently following a meal plan? No    Are you taking your medications as prescribed? No   does not take Humalog because she did not know what it was an depression also played a role   Date Diagnosed 2000      Health Coping   How would you rate your overall health? Good   feels she has her blood pressure and kideny issues controlled wants help with diabetes and weight      Psychosocial Assessment   Patient Belief/Attitude about Diabetes Motivated to manage diabetes   there may be a bit of defeat and burnout as well at times   Self-care barriers Lack of transportation;Unsteady gait/risk for falls;Lack of material resources;Other (comment)   depression issues may impact care   Self-management support Doctor's office;Family;CDE visits    Patient Concerns Nutrition/Meal planning;Monitoring;Medication;Problem Solving;Glycemic Control    Preferred Learning Style No preference indicated    Learning Readiness Ready    How often do you need to have someone help you when you read instructions, pamphlets, or other written materials from your doctor or pharmacy? 2 - Rarely    What is the last grade level you completed in school? 12      Pre-Education Assessment   Patient understands the diabetes disease and treatment process. Demonstrates understanding / competency    Patient understands incorporating nutritional management into lifestyle. Needs Review    Patient undertands incorporating physical activity into lifestyle. Demonstrates understanding / competency    Patient understands using medications safely. Needs Review    Patient understands monitoring blood glucose, interpreting and using results Needs Review    Patient understands prevention, detection, and treatment of acute complications. Needs Review    Patient understands prevention, detection, and treatment of chronic complications. Demonstrates understanding / competency    Patient understands how to develop strategies to  address psychosocial issues. Demonstrates understanding / competency    Patient understands how to develop strategies to promote health/change behavior. Demonstrates understanding / competency      Complications   Last HgB A1C per patient/outside source 9 %    How often do you check your blood sugar? 0 times/day (not testing)    Have you had a dilated eye exam in the past 12 months? Yes    Have  you had a dental exam in the past 12 months? No   need to ask her   Are you checking your feet? Yes    How many days per week are you checking your feet? 7      Patient Education   Previous Diabetes Education Yes (please comment)    Medications Reviewed patients medication for diabetes, action, purpose, timing of dose and side effects.    Monitoring Other (comment);Taught/evaluated SMBG meter.      Individualized Goals (developed by patient)   Monitoring  Other (comment)   use CGM to collect information about blood sugar patterns     Outcomes   Expected Outcomes Demonstrated interest in learning. Expect positive outcomes    Future DMSE 4-6 wks    Program Status Not Completed           Individualized Plan for Diabetes Self-Management Training:   Learning Objective:  Patient will have a greater understanding of diabetes self-management. Patient education plan is to attend individual and/or group sessions per assessed needs and concerns.   Plan:   Patient Instructions  I will send your information in to [place]. They will try to contact you to get your permission to mail more sensors out. Patient support [phone] and [phone]   If you are abel to connect. Please contact me by phone. [phone]  I will ask for strips. Lancets, Tresiba pens to replace your Lantus.   I suggest you take the Humalog instead of the Novolog.   Call with questions of concerns and see you on  April 18thj.  [name] [phone]    Expected Outcomes:  Demonstrated interest in learning. Expect positive outcomes  Education material provided: Diabetes Resources  If problems or questions, patient to contact team via:  Phone  Future DSME appointment: 4-6 wks  Debera Lat, RD 02/12/2021 3:36 PM.

## 2021-02-14 MED ORDER — INSULIN PEN NEEDLE 32G X 4 MM MISC
3 refills | Status: DC
  Filled 2021-02-14: qty 300, 75d supply, fill #0

## 2021-02-14 MED ORDER — INSULIN DEGLUDEC 100 UNIT/ML ~~LOC~~ SOPN
24.0000 [IU] | PEN_INJECTOR | Freq: Every day | SUBCUTANEOUS | 1 refills | Status: DC
  Filled 2021-02-14: qty 15, 62d supply, fill #0

## 2021-02-14 MED ORDER — ACCU-CHEK GUIDE VI STRP
ORAL_STRIP | 3 refills | Status: DC
  Filled 2021-02-14: qty 250, 83d supply, fill #0

## 2021-02-14 MED ORDER — ACCU-CHEK SOFTCLIX LANCETS MISC
3 refills | Status: DC
  Filled 2021-02-14: qty 300, 90d supply, fill #0

## 2021-02-14 NOTE — Telephone Encounter (Signed)
Agreed. Will send script for Tresiba pen with pen needles.

## 2021-02-15 ENCOUNTER — Other Ambulatory Visit (HOSPITAL_COMMUNITY): Payer: Self-pay

## 2021-02-15 ENCOUNTER — Telehealth: Payer: Self-pay | Admitting: Dietician

## 2021-02-15 ENCOUNTER — Telehealth: Payer: Medicare Other

## 2021-02-15 NOTE — Telephone Encounter (Signed)
Called to follow up on new to CGM: she said it is working well. She is getting numbers of 150-200-300, took Lantus two times and now numbers in the 100s. She likes the CGM. We discussed that her clinical notes that were faxed to [place] read that she is not taking her medicine or checking her blood sugars and this may cause a delay in her qualifying for CGM coverage per Medicare and getting more sensors. She may need to schedule a doctor visit and have documentation that she is taking her medicines and checking her blood sugars to qualify.  She verbalized understanding.

## 2021-02-16 ENCOUNTER — Other Ambulatory Visit (HOSPITAL_COMMUNITY): Payer: Self-pay

## 2021-02-16 ENCOUNTER — Ambulatory Visit: Payer: Medicare Other | Admitting: *Deleted

## 2021-02-16 DIAGNOSIS — Z794 Long term (current) use of insulin: Secondary | ICD-10-CM

## 2021-02-16 DIAGNOSIS — E1169 Type 2 diabetes mellitus with other specified complication: Secondary | ICD-10-CM

## 2021-02-16 DIAGNOSIS — E785 Hyperlipidemia, unspecified: Secondary | ICD-10-CM

## 2021-02-16 DIAGNOSIS — E083413 Diabetes mellitus due to underlying condition with severe nonproliferative diabetic retinopathy with macular edema, bilateral: Secondary | ICD-10-CM

## 2021-02-16 DIAGNOSIS — Z992 Dependence on renal dialysis: Secondary | ICD-10-CM

## 2021-02-16 DIAGNOSIS — N186 End stage renal disease: Secondary | ICD-10-CM

## 2021-02-16 DIAGNOSIS — I10 Essential (primary) hypertension: Secondary | ICD-10-CM

## 2021-02-16 DIAGNOSIS — E1142 Type 2 diabetes mellitus with diabetic polyneuropathy: Secondary | ICD-10-CM

## 2021-02-16 NOTE — Chronic Care Management (AMB) (Addendum)
Care Management    RN Visit Note  02/16/2021 Name: Jane [name] MRN: [id number] DOB: Aug 12, 1976  Subjective: Jane [name] is a 45 y.o. year old female who is a primary care patient of Jane Persia, MD. The care management team was consulted for assistance with disease management and care coordination needs.    Engaged with patient by telephone for follow up visit in response to provider referral for case management and/or care coordination services.   Consent to Services:   Jane [name] was given information about Care Management services today including:  1. Care Management services includes personalized support from designated clinical staff supervised by her physician, including individualized plan of care and coordination with other care providers 2. 24/7 contact phone numbers for assistance for urgent and routine care needs. 3. The patient may stop case management services at any time by phone call to the office staff.  Patient agreed to services and consent obtained.   Assessment: Review of patient past medical history, allergies, medications, health status, including review of consultants reports, laboratory and other test data, was performed as part of comprehensive evaluation and provision of chronic care management services.   SDOH (Social Determinants of Health) assessments and interventions performed:    Care Plan  Allergies  Allergen Reactions  . Adhesive [Tape] Other (See Comments)    Irritation     Outpatient Encounter Medications as of 02/16/2021  Medication Sig  . Accu-Chek Softclix Lancets lancets Check blood sugar up to 3 times  day  . acetaminophen (TYLENOL) 500 MG tablet Take 1,000 mg by mouth every 6 (six) hours as needed (for pain.).  Marland Kitchen albuterol (PROVENTIL HFA) 108 (90 Base) MCG/ACT inhaler INHALE 2 PUFFS BY MOUTH EVERY 4 HOURS AS NEEDED FOR COUGHING, WHEEZING, OR SHORTNESS OF BREATH  . amLODipine (NORVASC) 5 MG tablet TAKE 1 TABLET (5 MG  TOTAL) BY MOUTH AT BEDTIME.  Marland Kitchen amLODipine (NORVASC) 5 MG tablet TAKE 1 TABLET BY MOUTH EVERY NIGHT AS DIRECTED  . aspirin EC 81 MG tablet Take 1 tablet (81 mg total) by mouth daily.  . blood glucose meter kit and supplies Dispense based on patient and insurance preference. Use up to four times daily as directed. (FOR ICD-10 E10.9, E11.9).  Marland Kitchen Blood Glucose Monitoring Suppl (ACCU-CHEK GUIDE) w/Device KIT 1 each by Does not apply route 3 (three) times daily.  . Blood Pressure Monitor MISC USE AS DIRECTED FOUR TIMES DAILY.  Marland Kitchen buPROPion (WELLBUTRIN SR) 100 MG 12 hr tablet Take 1 tablet (100 mg total) by mouth 2 (two) times daily.  . cyclobenzaprine (FLEXERIL) 5 MG tablet Take 1 tablet (5 mg total) by mouth 3 (three) times daily as needed for muscle spasms.  . diclofenac Sodium (VOLTAREN) 1 % GEL APPLY 4 G TOPICALLY 4 (FOUR) TIMES DAILY.  . fluconazole (DIFLUCAN) 150 MG tablet TAKE 1 TABLET (150 MG TOTAL) BY MOUTH EVERY 3 (THREE) DAYS FOR TWO DOSES  . gabapentin (NEURONTIN) 100 MG capsule TAKE 1 CAPSULE (100 MG TOTAL) BY MOUTH AT BEDTIME.  Marland Kitchen gabapentin (NEURONTIN) 100 MG capsule TAKE 1 CAPSULE BY MOUTH AT BEDTIME  . glucose blood (ACCU-CHEK GUIDE) test strip Check blood sugar up to 3 times  day  . icosapent Ethyl (VASCEPA) 1 g capsule Take 2 capsules (2 g total) by mouth 2 (two) times daily.  . insulin degludec (TRESIBA) 100 UNIT/ML FlexTouch Pen Inject 24 Units into the skin daily.  . insulin lispro (HUMALOG) 100 UNIT/ML KwikPen INJECT 4 TO 9 UNITS INTO  THE SKIN THREE TIMES DAILY BASED ON CARB COUNTS AND CORRECTION SCALE FOR A MAXIMUM OF 24 UNITS DAILY.  Marland Kitchen Insulin Pen Needle 32G X 4 MM MISC Use to inject insulin 4 times a day  . Insulin Syringe-Needle U-100 31G X 15/64" 0.3 ML MISC Use to inject insulin daily  . megestrol (MEGACE) 40 MG tablet TAKE 2 TABLETS (80 MG TOTAL) BY MOUTH 2 (TWO) TIMES DAILY.  . metoprolol tartrate (LOPRESSOR) 25 MG tablet Take 1 tablet (25 mg total) by mouth 2 (two) times  daily. Do not take the night before dialysis, and take 2 hours after dialysis.  Marland Kitchen mirtazapine (REMERON) 15 MG tablet TAKE 1 TABLET BY MOUTH ONCE DAILY AT NIGHT.  Marland Kitchen multivitamin (RENA-VIT) TABS tablet Take 1 tablet by mouth daily.  Marland Kitchen oxyCODONE-acetaminophen (PERCOCET) 10-325 MG tablet TAKE 1 TABLET BY MOUTH 4 TIMES A DAY AS NEEDED  . PRESCRIPTION MEDICATION at bedtime.  Marland Kitchen RENAGEL 800 MG tablet Take 4,800 mg by mouth See admin instructions. Take 4800 mg tablets three times daily with meals  . rosuvastatin (CRESTOR) 40 MG tablet Take 1 tablet (40 mg total) by mouth daily. (Patient taking differently: Take 40 mg by mouth at bedtime. )  . sertraline (ZOLOFT) 100 MG tablet Take 100 mg by mouth daily.  . traMADol (ULTRAM) 50 MG tablet TAKE 1 TABLET BY MOUTH EVERY 4 TO 6 HOURS AS NEEDED FOR PAIN   No facility-administered encounter medications on file as of 02/16/2021.    Patient Active Problem List   Diagnosis Date Noted  . Diabetic neuropathy ([place]) 01/29/2021  . Neck pain 01/07/2021  . Chronic diastolic heart failure ([place]) 01/06/2021  . Acute COVID-19 12/16/2020  . Left lateral abdominal pain 02/02/2020  . Ulcer of left foot due to type 2 diabetes mellitus ([place]) 06/03/2019  . Diabetic foot ulcer ([place]) 04/22/2019  . Arthritis of left knee 02/11/2019  . 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 ([place]) 05/23/2018  . DOE (dyspnea on exertion) 02/26/2018  . Hidradenitis suppurativa 02/12/2018  . Anemia in chronic kidney disease 02/12/2018  . Secondary hyperparathyroidism of renal origin ([place]) 01/30/2018  . Coagulation defect, unspecified ([place]) 01/18/2018  . Major depressive disorder 08/11/2017  . Sinus tachycardia 02/23/2017  . ESRD (end stage renal disease) on dialysis ([place]) 01/05/2017  . CAD S/P BMS PCI to prox LAD 03/31/2016  . Atypical angina ([place]) 03/29/2016  . Abnormal uterine bleeding (AUB) 03/17/2016  . PAOD (peripheral arterial occlusive disease) ([place]) 01/08/2016  .  Charcot foot due to diabetes mellitus ([place]) 09/07/2015  . Peripheral neuropathy 09/20/2014  . Essential hypertension 08/16/2014  . Seasonal allergic rhinitis 08/16/2014  . Morbid obesity ([place]) 09/26/2012  . Hyperlipidemia associated with type 2 diabetes mellitus ([place]) 02/26/2009  . 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 Asthma 05/08/2007    Conditions to be addressed/monitored: CAD, ESRD with 32 oz fluid restrticiton per day, IDDM, HLD, obesity, depression, Covid + on 12/14/20   Care Plan : CCM RN Diabetes Type 2 (Adult)  Updates made by Barrington Ellison, RN since 02/16/2021 12:00 AM    Problem: Disease Progression (Diabetes, Type 2)     Goal: Disease Progression Prevented or Minimized   Start Date: 01/31/2020  Expected End Date: 05/13/2021  Recent Progress: On track  Priority: Low  Note:   CARE PLAN ENTRY (see longitudinal plan of care for additional care plan information)  Current Barriers:  . Chronic  Disease Management support, education, and care coordination needs related to CAD, HTN, HLD, DMII, and ESRD- patient states she is now wearing the Dexcom CGM and says it is providing her and her provider with "lots of good information", she reports the lowest CGM reading as in the low 70s with symptoms of sweating and shakiness, she says she was recently diagnosed with scoliosis and saw a pain management provider and is using oxycodone to treat her neck and knee pain, still wants her chronic knee pain to be addressed with an injection and understands that if she cannot get her diabetes in better control then she is not a candidate for a steroid injection, she says she tries to take her medications as prescribed but often does not, due to lack of motivation she says her blood pressure is also running high at dialysis, she continues to meet with her counselor in a regular basis.  Clinical Goal(s) related  to CAD, HTN, HLD, DMII, and ESRD:  Over the next 30-60 days, patient will:  . Work with the care management team to address educational, disease management, and care coordination needs  . Begin or continue self health monitoring activities as directed today . Call provider office for new or worsened signs and symptoms New or worsened symptom related to DM, HTN, CAD, ESRD . Call care management team with questions or concerns . Verbalize basic understanding of patient centered plan of care established today  Interventions related to CAD, HTN, HLD, DMII, and ESRD:  . Prior to interviewing patient, reviewed patient status, including review of recent office visit notes, consultants reports, relevant laboratory and other test results, and medications, completed. . Assessed patient's self management skills in regards to HTN, IDDM, HLD, CAD and ESRD and new diagnosis of Covid infection . Evaluation of current treatment plan related to HTN and IDDM, HLD and ESRD and patient's adherence to plan as established by provider. . Reviewed medications with patient and assessed medication taking behavior;  if indicated, discussed importance of medication adherence  . Much positive reinforcement given to patient for making clinic appointment to discuss and agree to CGM to assist her in improving her glycemic control . Reminded patient to contact this CCM RN for any questions or concerns that may arise before next month's call  Patient Self Care Activities related to CAD, HTN, HLD, DMII, and ESRD:  . Patient is unable to independently self-manage chronic health conditions      Plan: The care management team will reach out to the patient again over the next 30-60 days.  Kelli Churn RN, CCM, [facility] RN Care Manager [phone]

## 2021-02-16 NOTE — Patient Instructions (Signed)
Visit Information It was nice speaking with you today. Goals Addressed            This Visit's Progress   . Make and Keep All Appointments       Timeframe:  Long-Range Goal Priority:  High Start Date:         10/30/20                    Expected End Date:                       Follow Up Date 03/16/21    - ask family or friend for a ride - call to cancel if needed - keep a calendar with appointment dates    Why is this important?    Part of staying healthy is seeing the doctor for follow-up care.   If you forget your appointments, there are some things you can do to stay on track.    Notes: meeting goal    . COMPLETED: Manage My Emotions and acknowledge grief due to los of many loved ones in short period of time       Timeframe:  Long-Range Goal Priority:  High Start Date:         10/30/20                    Expected End Date:      01/15/21                 Follow Up Date resolved   - continue personal counseling - call and visit an old friend - talk about feelings with a friend, family or spiritual advisor - practice positive thinking and self-talk  - keep part time season job to give a reason to get out of the house -have fun with grandchildren     Why is this important?    When you are stressed, down or upset, your body reacts too.   For example, your blood pressure may get higher; you may have a headache or stomachache.   When your emotions get the best of you, your body's ability to fight off cold and flu gets weak.   These steps will help you manage your emotions.     Notes: 01/15/21- resolved       The patient verbalized understanding of instructions, educational materials, and care plan provided today and declined offer to receive copy of patient instructions, educational materials, and care plan.   The care management team will reach out to the patient again over the next 30-60 days.   Kelli Churn RN, CCM, [facility] RN Care  Manager [phone]

## 2021-02-17 ENCOUNTER — Other Ambulatory Visit (HOSPITAL_COMMUNITY): Payer: Self-pay

## 2021-02-18 ENCOUNTER — Ambulatory Visit (HOSPITAL_COMMUNITY): Payer: Medicare Other | Attending: Cardiovascular Disease

## 2021-02-18 ENCOUNTER — Other Ambulatory Visit (HOSPITAL_COMMUNITY): Payer: Medicare Other

## 2021-02-18 ENCOUNTER — Other Ambulatory Visit: Payer: Self-pay

## 2021-02-18 DIAGNOSIS — I10 Essential (primary) hypertension: Secondary | ICD-10-CM | POA: Diagnosis present

## 2021-02-18 DIAGNOSIS — R0609 Other forms of dyspnea: Secondary | ICD-10-CM

## 2021-02-18 DIAGNOSIS — I5032 Chronic diastolic (congestive) heart failure: Secondary | ICD-10-CM

## 2021-02-18 DIAGNOSIS — R0600 Dyspnea, unspecified: Secondary | ICD-10-CM | POA: Insufficient documentation

## 2021-02-18 LAB — ECHOCARDIOGRAM COMPLETE: S' Lateral: 2.9 cm

## 2021-02-19 NOTE — Progress Notes (Signed)
Internal Medicine Clinic Attending  Case discussed with Dr. Braswell at the time of the visit.  We reviewed the resident's history and exam and pertinent patient test results.  I agree with the assessment, diagnosis, and plan of care documented in the resident's note.  Jane [name], M.D., Ph.D.  

## 2021-02-22 ENCOUNTER — Telehealth: Payer: Self-pay | Admitting: Dietician

## 2021-02-22 DIAGNOSIS — Z794 Long term (current) use of insulin: Secondary | ICD-10-CM

## 2021-02-22 DIAGNOSIS — E083413 Diabetes mellitus due to underlying condition with severe nonproliferative diabetic retinopathy with macular edema, bilateral: Secondary | ICD-10-CM

## 2021-02-22 NOTE — Telephone Encounter (Signed)
Ms. Counts calls for more Dexcom G6 sensors. She likes using the Dexcom G6 as it is warning her during dialysis when she is going to drop low. I gave her [facility]'s number to call and see if they are in network with her insurance and could mail out sensors.

## 2021-02-24 ENCOUNTER — Other Ambulatory Visit (HOSPITAL_COMMUNITY): Payer: Self-pay

## 2021-02-24 MED ORDER — OXYCODONE-ACETAMINOPHEN 10-325 MG PO TABS
1.0000 | ORAL_TABLET | Freq: Four times a day (QID) | ORAL | 0 refills | Status: DC | PRN
Start: 2021-02-24 — End: 2021-03-26
  Filled 2021-02-24: qty 120, 30d supply, fill #0

## 2021-02-24 MED ORDER — NALOXONE HCL 4 MG/0.1ML NA LIQD
1.0000 | NASAL | 12 refills | Status: DC | PRN
Start: 2021-02-24 — End: 2021-11-11
  Filled 2021-02-24: qty 2, 2d supply, fill #0

## 2021-02-24 MED ORDER — VITAMIN D (CHOLECALCIFEROL) 50 MCG (2000 UT) PO CAPS: 4.0000 | ORAL_CAPSULE | Freq: Every day | ORAL | 3 refills | Status: DC

## 2021-02-25 MED ORDER — DEXCOM G6 RECEIVER DEVI: 0 refills | Status: DC

## 2021-02-25 MED ORDER — DEXCOM G6 SENSOR MISC: 3 refills | Status: DC

## 2021-02-25 MED ORDER — DEXCOM G6 TRANSMITTER MISC: 3 refills | Status: DC

## 2021-02-25 NOTE — Addendum Note (Signed)
Addended by: Resa Miner on: 02/25/2021 03:13 PM   Modules accepted: Orders

## 2021-02-25 NOTE — Telephone Encounter (Signed)
I spoke with Ms Ruppe yesterday who is checking her blood sugar with hr meter because the CGM sensor ended.  She states she saw the value of checking and taking her medicine while using the CGM and is now motivated to control her diabetes and use CGM. She did not call [facility], but says she has an appointment - they say her insurance is not in network with them. Will request rx to be sent to [facility] for assistance with finding an in network DME provider.

## 2021-03-01 ENCOUNTER — Ambulatory Visit (INDEPENDENT_AMBULATORY_CARE_PROVIDER_SITE_OTHER): Payer: Medicare Other | Admitting: Internal Medicine

## 2021-03-01 ENCOUNTER — Other Ambulatory Visit: Payer: Self-pay

## 2021-03-01 ENCOUNTER — Encounter: Payer: Self-pay | Admitting: Internal Medicine

## 2021-03-01 ENCOUNTER — Ambulatory Visit (INDEPENDENT_AMBULATORY_CARE_PROVIDER_SITE_OTHER): Payer: Medicare Other | Admitting: Dietician

## 2021-03-01 ENCOUNTER — Encounter: Payer: Self-pay | Admitting: Dietician

## 2021-03-01 ENCOUNTER — Other Ambulatory Visit (HOSPITAL_COMMUNITY): Payer: Self-pay

## 2021-03-01 VITALS — BP 120/85 | HR 100 | Temp 98.0°F | Ht 65.0 in | Wt 228.4 lb

## 2021-03-01 DIAGNOSIS — Z713 Dietary counseling and surveillance: Secondary | ICD-10-CM | POA: Diagnosis not present

## 2021-03-01 DIAGNOSIS — B3731 Acute candidiasis of vulva and vagina: Secondary | ICD-10-CM

## 2021-03-01 DIAGNOSIS — Z794 Long term (current) use of insulin: Secondary | ICD-10-CM | POA: Diagnosis not present

## 2021-03-01 DIAGNOSIS — E083413 Diabetes mellitus due to underlying condition with severe nonproliferative diabetic retinopathy with macular edema, bilateral: Secondary | ICD-10-CM

## 2021-03-01 DIAGNOSIS — I10 Essential (primary) hypertension: Secondary | ICD-10-CM

## 2021-03-01 DIAGNOSIS — B373 Candidiasis of vulva and vagina: Secondary | ICD-10-CM | POA: Diagnosis not present

## 2021-03-01 MED ORDER — AMLODIPINE BESYLATE 5 MG PO TABS
5.0000 mg | ORAL_TABLET | Freq: Every day | ORAL | 1 refills | Status: DC
Start: 2021-03-01 — End: 2021-04-06
  Filled 2021-03-01: qty 90, 90d supply, fill #0

## 2021-03-01 MED ORDER — FLUCONAZOLE 150 MG PO TABS
150.0000 mg | ORAL_TABLET | Freq: Once | ORAL | 0 refills | Status: AC
  Filled 2021-03-01: qty 1, 1d supply, fill #0

## 2021-03-01 NOTE — Assessment & Plan Note (Signed)
Patient's BP today is 120/85 with a goal of <140/80. The patient endorses adherence to her medication regimen. She  denied, chest pain, headache, visual changes, lightheadedness, weakness, dizziness on standing, swelling in the feet or ankles.   Plan:  Continue amlodipine 5mg  TID Also on Metoprolol 25 mg BID

## 2021-03-01 NOTE — Patient Instructions (Addendum)
Thank you for trusting me with your care. To recap, today we discussed the following:   1. Essential hypertension  - amLODipine (NORVASC) 5 MG tablet; Take 1 tablet (5 mg total) by mouth at bedtime.  Dispense: 90 tablet; Refill: 1  2. Yeast vaginitis  - fluconazole (DIFLUCAN) 150 MG tablet; Take 1 tablet (150 mg total) by mouth once for 1 dose.  Dispense: 1 tablet; Refill: 0\  3. Type 2 Diabetes Mellius  - Donna , diabetic coordinator, has given you detailed instructions.  - No change to regimen today.

## 2021-03-01 NOTE — Progress Notes (Signed)
   CC: Hypertension and type 2 diabetes  HPI:Jane [name] is a 45 y.o. female who presents for evaluation of hypertension and type 2 diabetes. Please see individual problem based A/P for details.   Past Medical History:  Diagnosis Date  . Abnormal uterine bleeding (AUB)   . Asthma    prn inhaler  . CAD S/P BMS PCI to prox LAD cardiologist--- dr Beverlyn Roux LAD to Mid LAD lesion, 90% stenosed. Post intervention - Vision BMS 3.0 mm x 18 mm (~3.5 mm) there is a 0% residual stenosis. ;   nuclear stress test-- 09-13-2018 low risk with no ischemia, nuclear ef 56%  . Charcot foot due to diabetes mellitus ([place]) 11/2016   left 2018; right 2019  . Depression   . Diabetic neuropathy (HCC)    feet  . Diabetic retinopathy, nonproliferative, severe ([place])    bilateral  . ESRD (end stage renal disease) on dialysis [facility])    ?chronic interstitial nephritis  [facility]  . H/O non-ST elevation myocardial infarction (NSTEMI) 03/2016   Found in 90% mid LAD lesion treated with bare-metal stent (BMS) PCI - vision BMS 3.0 mm x 18 mm  . History of MRSA infection 2007  . Hyperlipidemia   . Hypertension   . IDA (iron deficiency anemia)   . Insulin dependent type 2 diabetes mellitus ([place])   . Iron deficiency anemia    takes iron supplement  . PAOD (peripheral arterial occlusive disease) ([place])    vascular--- dr Bridgett Larsson  . S/P arteriovenous (AV) fistula creation 07/2017  . S/p bare metal coronary artery stent 03/31/2016   BMS x1  to pLAD  . Sickle cell trait ([place])    Review of Systems:   Review of Systems  Constitutional: Negative for chills and fever.  Eyes: Negative for blurred vision and double vision.  Neurological: Negative for dizziness and headaches.     Physical Exam: Vitals:   03/01/21 1350  BP: 120/85  Pulse: 100  Temp: 98 F (36.7 C)  TempSrc: Oral  SpO2: 100%  Weight: 228 lb 6.4 oz (103.6 kg)  Height: 5\' 5"  (1.651 m)     General: Middle age women with  red hair, no acute distress HEENT: Normocephalic, atraumatic , Conjunctiva nl  Cardiovascular: Normal rate, regular rhythm.  No murmurs, rubs, or gallops Pulmonary : Equal breath sounds, No wheezes, rales, or rhonchi Abdominal: soft, nontender,  bowel sounds present   Assessment & Plan:   See Encounters Tab for problem based charting.  Patient discussed with Dr. Evette Doffing

## 2021-03-01 NOTE — Patient Instructions (Signed)
Nice meeting with you today!  GETTING AN INPEN...   You are doing a great job of self managing your diabetes!  To get an INpen:   Can call the [facility] at [phone]. Choose option 2 to place an order for an InPen. It's $35. You can choose pink, blue, or grey.   You can pay with a credit card over the phone if needed.   Feel free to give them my name and number if needed:  [name]    [phone]  The Humalog insulin cartridges for it will come from your pharmacy.   Approximately 1 unit of Humalog for each 12 grams of carbohydrate. So if you take 6 units of Humalog you should try to eat about 72  grams carb.   You can test your basal insulin Tyler Aas) by not eating/drinking any carbs for 8 hours and your blood sugar should stay between 85-145 with target at 115.   I suggest we follow up by phone or in person in the next 2 -4 weeks  [name]

## 2021-03-01 NOTE — Assessment & Plan Note (Signed)
Patient report she is having vaginal itching and irritation. She reports history of yeast infections and this is similar to those. She denies any new sexual partners since her boyfriend died of Covid Infection 2023-07-20 of last year.   Assessment: Candidal vulvovaginitis Plan:  - fluconazole (DIFLUCAN) 150 MG tablet; Take 1 tablet (150 mg total) by mouth once for 1 dose.  Dispense: 1 tablet; Refill: 0

## 2021-03-01 NOTE — Assessment & Plan Note (Signed)
DMII: Hgb A1c 9 % at the last visit, likely false low in setting of ESRD on HD. Patient refferred to daibetic coordinator for CGM. Review of chart shows patient is finding this helpful.  She was temporarily out of her CGM sensors, She does have some data for review from Dexcom and Accu Check.  She has 11 days of Dexcom readings and in range 46% of the time. High or very high range 53% of time. She was confused and not taking Morrell Riddle consistently the same time everyday. After her discussion with Diabetic Coordinator and our discussion she reports more confidence in her knowledge of taking her medication.   Assessment: Uncontrolled Type 2 Diabetes Melliuts with complications, long term use of Insulin Plan:  -Patient will continue Treseba 22 units daily and Humalog 6 units TID. [name] is working with patient to received a InPen, and this will help her estimate carbs to titrate short acting insulin.  -Repeat A1c at next visit if after 3 months from this date

## 2021-03-01 NOTE — Progress Notes (Signed)
Diabetes Self-Management Education  Visit Type: Follow-up  Appt. Start Time: 1330 Appt. End Time: 2376  03/01/2021  [name], identified by name and date of birth, is a 45 y.o. female with a diagnosis of Diabetes:  Type 2 DM.   ASSESSMENT Jane [name] wants help to get her diabetes controlled. Says she is remembering and using a lot of what she learned in tehpast to help her.  this can help some of her other problems. We discussed how to eat for both her diabetes and kidney disease. Refer for Medical Nutrition Therapy as needed. Continuous glucose monitoring Application to [facility] pharmacy for Dexcpm sensors.   Would like to use Inpen to help her manage her blood sugars better.  She liked victoza does not know why it was stopped    Diabetes Self-Management Education - 03/01/21 1400      Visit Information   Visit Type Follow-up      Health Coping   How would you rate your overall health? Good      Patient Education   Nutrition management  Carbohydrate counting    Medications Reviewed patients medication for diabetes, action, purpose, timing of dose and side effects.;Reviewed medication adjustment guidelines for hyperglycemia and sick days.    Monitoring Identified appropriate SMBG and/or A1C goals.    Acute complications Taught treatment of hypoglycemia - the 15 rule.;Discussed and identified patients' treatment of hyperglycemia.      Patient Self-Evaluation of Goals - Patient rates self as meeting previously set goals (% of time)   Monitoring >75%      Post-Education Assessment   Patient understands incorporating nutritional management into lifestyle. Needs Review    Patient understands using medications safely. Needs Review    Patient understands monitoring blood glucose, interpreting and using results Demonstrates understanding / competency    Patient understands prevention, detection, and treatment of acute complications. Needs Review      Outcomes   Expected Outcomes  Demonstrated interest in learning. Expect positive outcomes    Future DMSE 4-6 wks    Program Status Not Completed      Subsequent Visit   Since your last visit have you continued or begun to take your medications as prescribed? Yes    Since your last visit have you had your blood pressure checked? Yes    Is your most recent blood pressure lower, unchanged, or higher since your last visit? Unchanged    Since your last visit have you experienced any weight changes? No change    Since your last visit, are you checking your blood glucose at least once a day? Yes           Individualized Plan for Diabetes Self-Management Training:   Learning Objective:  Patient will have a greater understanding of diabetes self-management. Patient education plan is to attend individual and/or group sessions per assessed needs and concerns.   Plan:   There are no Patient Instructions on file for this visit.  Expected Outcomes:  Demonstrated interest in learning. Expect positive outcomes  Education material provided: Diabetes Resources  If problems or questions, patient to contact team via:  Phone  Future DSME appointment: 4-6 wks  Debera Lat, RD 03/01/2021 2:22 PM.

## 2021-03-02 ENCOUNTER — Other Ambulatory Visit: Payer: Self-pay | Admitting: Internal Medicine

## 2021-03-02 ENCOUNTER — Other Ambulatory Visit: Payer: Self-pay | Admitting: Dietician

## 2021-03-02 ENCOUNTER — Other Ambulatory Visit (HOSPITAL_COMMUNITY): Payer: Self-pay

## 2021-03-02 DIAGNOSIS — E083413 Diabetes mellitus due to underlying condition with severe nonproliferative diabetic retinopathy with macular edema, bilateral: Secondary | ICD-10-CM

## 2021-03-02 DIAGNOSIS — Z794 Long term (current) use of insulin: Secondary | ICD-10-CM

## 2021-03-02 MED ORDER — INPEN 100-PINK-LILLY DEVI
0 refills | Status: DC
  Filled 2021-03-02: qty 1, 1d supply, fill #0

## 2021-03-02 NOTE — Telephone Encounter (Addendum)
Called Jane [name] about her Inpen- she would like a Pink pen for Humalog insulin. She understands that it will likey be denied and then a PA will need to be done to get it approved through [employer]. Also that it is on back order. She will need Humalog cartridges when she gets her Inpen instead of Humalog kwickpens

## 2021-03-02 NOTE — Progress Notes (Signed)
Internal Medicine Clinic Attending  Case discussed with Dr. Steen  At the time of the visit.  We reviewed the resident's history and exam and pertinent patient test results.  I agree with the assessment, diagnosis, and plan of care documented in the resident's note.  

## 2021-03-03 ENCOUNTER — Other Ambulatory Visit (HOSPITAL_COMMUNITY): Payer: Self-pay

## 2021-03-03 ENCOUNTER — Telehealth: Payer: Self-pay

## 2021-03-03 NOTE — Telephone Encounter (Signed)
Pt is requesting a call back she stated  That the insurance is declining the injection device for insulin (INPEN 100-PINK-LILLY) DEVI

## 2021-03-04 ENCOUNTER — Other Ambulatory Visit (HOSPITAL_COMMUNITY): Payer: Self-pay

## 2021-03-08 ENCOUNTER — Telehealth: Payer: Self-pay | Admitting: *Deleted

## 2021-03-08 DIAGNOSIS — Z992 Dependence on renal dialysis: Secondary | ICD-10-CM

## 2021-03-08 DIAGNOSIS — N186 End stage renal disease: Secondary | ICD-10-CM

## 2021-03-08 DIAGNOSIS — Z794 Long term (current) use of insulin: Secondary | ICD-10-CM

## 2021-03-08 DIAGNOSIS — E083413 Diabetes mellitus due to underlying condition with severe nonproliferative diabetic retinopathy with macular edema, bilateral: Secondary | ICD-10-CM

## 2021-03-08 NOTE — Telephone Encounter (Signed)
Lyrik calls to tell us that [employer] needs a doctor order and clinical notes sent to them before they can send out her Continuous glucose monitor. Call to [facility]; chart notes and prescription receipt confirmed by Byram. Ms Haggard was also informed that the PA for her inpen is pending and when it is approved, a prescription for Humalog cartridges will also be requested. She will need an appointment to set up the Therapy setting in her app.

## 2021-03-08 NOTE — Telephone Encounter (Signed)
Yes- this was expected. Did pharmacy send a PA to [place]?

## 2021-03-08 NOTE — Telephone Encounter (Signed)
For your information. Thanks in advance for helping her to get this covered by her insurance.

## 2021-03-08 NOTE — Telephone Encounter (Signed)
Information was faxed to [employer] for InPen  100-Pink-Lilly Humalog Device.  Awaiting determination.  Sander Nephew, RN 03/08/2021 2:38 PM.

## 2021-03-11 ENCOUNTER — Ambulatory Visit: Payer: Medicare Other

## 2021-03-11 ENCOUNTER — Other Ambulatory Visit (HOSPITAL_COMMUNITY): Payer: Self-pay

## 2021-03-11 NOTE — Progress Notes (Deleted)
1. History of Present Illness:  Patient is a 45 y.o. year old female who presents for placement of a permanent hemodialysis access. Her last access Superficialization Right brachiocephalic  of a  was placed on 11/15/2017.  She presents today secondary to ***  Past Medical History:  Diagnosis Date  . Abnormal uterine bleeding (AUB)   . Asthma    prn inhaler  . CAD S/P BMS PCI to prox LAD cardiologist--- dr Beverlyn Roux LAD to Mid LAD lesion, 90% stenosed. Post intervention - Vision BMS 3.0 mm x 18 mm (~3.5 mm) there is a 0% residual stenosis. ;   nuclear stress test-- 09-13-2018 low risk with no ischemia, nuclear ef 56%  . Charcot foot due to diabetes mellitus ([place]) 11/2016   left 2018; right 2019  . Depression   . Diabetic neuropathy (HCC)    feet  . Diabetic retinopathy, nonproliferative, severe ([place])    bilateral  . ESRD (end stage renal disease) on dialysis [facility])    ?chronic interstitial nephritis  [facility]  . H/O non-ST elevation myocardial infarction (NSTEMI) 03/2016   Found in 90% mid LAD lesion treated with bare-metal stent (BMS) PCI - vision BMS 3.0 mm x 18 mm  . History of MRSA infection 2007  . Hyperlipidemia   . Hypertension   . IDA (iron deficiency anemia)   . Insulin dependent type 2 diabetes mellitus ([place])   . Iron deficiency anemia    takes iron supplement  . PAOD (peripheral arterial occlusive disease) ([place])    vascular--- dr Bridgett Larsson  . S/P arteriovenous (AV) fistula creation 07/2017  . S/p bare metal coronary artery stent 03/31/2016   BMS x1  to pLAD  . Sickle cell trait [facility])     Past Surgical History:  Procedure Laterality Date  . ACHILLES TENDON LENGTHENING Left 11/24/2016   Procedure: Left Achilles tendon lengthening (open);  Surgeon: Wylene Simmer, MD;  Location: [place];  Service: Orthopedics;  Laterality: Left;  . AV FISTULA PLACEMENT Right 07/31/2017   Procedure: ARTERIOVENOUS BRACHIOCEPHALIC FISTULA CREATION RIGHT  ARM;  Surgeon: Conrad Morris, MD;  Location: [place];  Service: Vascular;  Laterality: Right;  . CALCANEAL OSTEOTOMY Left 11/24/2016   Procedure: Left hindfoot osteotomy and fusion;  Surgeon: Wylene Simmer, MD;  Location: [place];  Service: Orthopedics;  Laterality: Left;  . CARDIAC CATHETERIZATION N/A 03/31/2016   Procedure: Left Heart Cath and Coronary Angiography;  Surgeon: Lorretta Harp, MD;  Location: [facility] INVASIVE CV LAB: 90% early mLAD, normal LV Fxn  . CARDIAC CATHETERIZATION N/A 03/31/2016   Procedure: Coronary Stent Intervention;  Surgeon: Lorretta Harp, MD;  Location: [place] CV LAB;  Service: Cardiovascular: PCI to mLAD BMS Vision 3.0 mm x 18 mm  . CESAREAN SECTION  1997  . DILITATION & CURRETTAGE/HYSTROSCOPY WITH NOVASURE ABLATION N/A 09/15/2020   Procedure: DILATATION & CURETTAGE/HYSTEROSCOPY;  Surgeon: Lavonia Drafts, MD;  Location: [place];  Service: Gynecology;  Laterality: N/A;  . FISTULA SUPERFICIALIZATION Right 11/15/2017   Procedure: FISTULA SUPERFICIALIZATION RIGHT BRACHIOCEPHALIC  RIGHT;  Surgeon: Conrad [place], MD;  Location: [place];  Service: Vascular;  Laterality: Right;  . FOOT SURGERY     multiple for [place]  . TUBAL LIGATION  1997     Social History Social History   Tobacco Use  . Smoking status: Former Smoker    Packs/day: 0.00    Years: 0.50    Pack years: 0.00  Types: Cigarettes    Quit date: 08/29/2015    Years since quitting: 5.5  . Smokeless tobacco: Never Used  Vaping Use  . Vaping Use: Never used  Substance Use Topics  . Alcohol use: Yes    Alcohol/week: 0.0 standard drinks    Comment: occasional  . Drug use: Yes    Types: Marijuana    Comment: 09-08-2020 per pt seldom smokes    Family History Family History  Problem Relation Age of Onset  . Stroke Mother   . Diabetes Mother   . Hypertension Mother   . Aneurysm Mother   . Diabetes Father   . Hypertension Father   . Diabetes Sister   .  Hypertension Sister   . Diabetes Maternal Grandmother   . Diabetes Maternal Grandfather   . Cancer Paternal Grandfather        Prostate    Allergies  Allergies  Allergen Reactions  . Adhesive [Tape] Other (See Comments)    Irritation      Current Outpatient Medications  Medication Sig Dispense Refill  . Accu-Chek Softclix Lancets lancets Check blood sugar up to 3 times  day 300 each 3  . acetaminophen (TYLENOL) 500 MG tablet Take 1,000 mg by mouth every 6 (six) hours as needed (for pain.).    Marland Kitchen albuterol (PROVENTIL HFA) 108 (90 Base) MCG/ACT inhaler INHALE 2 PUFFS BY MOUTH EVERY 4 HOURS AS NEEDED FOR COUGHING, WHEEZING, OR SHORTNESS OF BREATH 20.1 g 2  . amLODipine (NORVASC) 5 MG tablet TAKE 1 TABLET BY MOUTH EVERY NIGHT AS DIRECTED 90 tablet 3  . amLODipine (NORVASC) 5 MG tablet Take 1 tablet (5 mg total) by mouth at bedtime. 90 tablet 1  . aspirin EC 81 MG tablet Take 1 tablet (81 mg total) by mouth daily. 90 tablet 3  . blood glucose meter kit and supplies Dispense based on patient and insurance preference. Use up to four times daily as directed. (FOR ICD-10 E10.9, E11.9). 1 each 0  . Blood Glucose Monitoring Suppl (ACCU-CHEK GUIDE) w/Device KIT 1 each by Does not apply route 3 (three) times daily. 1 kit 0  . Blood Pressure Monitor MISC USE AS DIRECTED FOUR TIMES DAILY. 1 each 0  . buPROPion (WELLBUTRIN SR) 100 MG 12 hr tablet Take 1 tablet (100 mg total) by mouth 2 (two) times daily. 60 tablet 1  . Cholecalciferol (VITAMIN D3) 50 MCG (2000 UT) CAPS Take 4 capsules (8,000 Units total) by mouth daily. 360 capsule 3  . Continuous Blood Gluc Receiver (DEXCOM G6 RECEIVER) DEVI Use to check blood sugar at least 6 times a day 1 each 0  . Continuous Blood Gluc Sensor (DEXCOM G6 SENSOR) MISC Use to check blood sugar at least 6 times a day 9 each 3  . Continuous Blood Gluc Transmit (DEXCOM G6 TRANSMITTER) MISC Use to check blood sugar at least 6 times a day 1 each 3  . cyclobenzaprine  (FLEXERIL) 5 MG tablet Take 1 tablet (5 mg total) by mouth 3 (three) times daily as needed for muscle spasms. 21 tablet 0  . diclofenac Sodium (VOLTAREN) 1 % GEL APPLY 4 G TOPICALLY 4 (FOUR) TIMES DAILY. 100 g 0  . gabapentin (NEURONTIN) 100 MG capsule TAKE 1 CAPSULE (100 MG TOTAL) BY MOUTH AT BEDTIME. 30 capsule 0  . gabapentin (NEURONTIN) 100 MG capsule TAKE 1 CAPSULE BY MOUTH AT BEDTIME 30 capsule 3  . glucose blood (ACCU-CHEK GUIDE) test strip Check blood sugar up to 3 times  day 300  each 3  . icosapent Ethyl (VASCEPA) 1 g capsule Take 2 capsules (2 g total) by mouth 2 (two) times daily. 120 capsule 11  . injection device for insulin (INPEN 100-PINK-LILLY) DEVI Use to dose Humalog insulin before meals as directed 3 times a day. 1 each 0  . insulin degludec (TRESIBA) 100 UNIT/ML FlexTouch Pen Inject 24 Units into the skin daily. 15 mL 1  . insulin lispro (HUMALOG) 100 UNIT/ML KwikPen INJECT 4 TO 9 UNITS INTO THE SKIN THREE TIMES DAILY BASED ON CARB COUNTS AND CORRECTION SCALE FOR A MAXIMUM OF 24 UNITS DAILY. 15 mL 11  . Insulin Pen Needle 32G X 4 MM MISC Use to inject insulin 4 times a day 400 each 3  . Insulin Syringe-Needle U-100 31G X 15/64" 0.3 ML MISC Use to inject insulin daily 100 each 3  . megestrol (MEGACE) 40 MG tablet TAKE 2 TABLETS (80 MG TOTAL) BY MOUTH 2 (TWO) TIMES DAILY. 120 tablet 8  . metoprolol tartrate (LOPRESSOR) 25 MG tablet Take 1 tablet (25 mg total) by mouth 2 (two) times daily. Do not take the night before dialysis, and take 2 hours after dialysis. 180 tablet 3  . mirtazapine (REMERON) 15 MG tablet TAKE 1 TABLET BY MOUTH ONCE DAILY AT NIGHT. 60 tablet 1  . multivitamin (RENA-VIT) TABS tablet Take 1 tablet by mouth daily.  3  . naloxone (NARCAN) nasal spray 4 mg/0.1 mL Place 1 spray into the nose as needed, if found unresponsive then spray into nose and call 911. 2 each 12  . oxyCODONE-acetaminophen (PERCOCET) 10-325 MG tablet TAKE 1 TABLET BY MOUTH 4 TIMES A DAY AS NEEDED  60 tablet 0  . oxyCODONE-acetaminophen (PERCOCET) 10-325 MG tablet Take 1 tablet by mouth 4 (four) times daily as needed. 120 tablet 0  . PRESCRIPTION MEDICATION at bedtime.    Marland Kitchen RENAGEL 800 MG tablet Take 4,800 mg by mouth See admin instructions. Take 4800 mg tablets three times daily with meals  11  . rosuvastatin (CRESTOR) 40 MG tablet Take 1 tablet (40 mg total) by mouth daily. (Patient taking differently: Take 40 mg by mouth at bedtime. ) 90 tablet 3  . sertraline (ZOLOFT) 100 MG tablet Take 100 mg by mouth daily.    . traMADol (ULTRAM) 50 MG tablet TAKE 1 TABLET BY MOUTH EVERY 4 TO 6 HOURS AS NEEDED FOR PAIN 20 tablet 0   No current facility-administered medications for this visit.    ROS:   General:  No weight loss, Fever, chills  HEENT: No recent headaches, no nasal bleeding, no visual changes, no sore throat  Neurologic: No dizziness, blackouts, seizures. No recent symptoms of stroke or mini- stroke. No recent episodes of slurred speech, or temporary blindness.  Cardiac: No recent episodes of chest pain/pressure, no shortness of breath at rest.  No shortness of breath with exertion.  Denies history of atrial fibrillation or irregular heartbeat  Vascular: No history of rest pain in feet.  No history of claudication.  No history of non-healing ulcer, No history of DVT   Pulmonary: No home oxygen, no productive cough, no hemoptysis,  No asthma or wheezing  Musculoskeletal:  $RemoveBeforeDEI'[ ]'QpQEsQFnJnnRZQJq$  Arthritis, $RemoveBef'[ ]'CPRDyYHhLr$  Low back pain,  $Remo'[ ]'HFTOv$  Joint pain  Hematologic:No history of hypercoagulable state.  No history of easy bleeding.  No history of anemia  Gastrointestinal: No hematochezia or melena,  No gastroesophageal reflux, no trouble swallowing  Urinary: $RemoveB'[ ]'srFAGdRS$  chronic Kidney disease, $RemoveBeforeD'[ ]'ShFHKRaRPiQppk$  on HD - $Re'[ ]'iHa$   MWF or [ ]  TTHS, [ ]  Burning with urination, [ ]  Frequent urination, [ ]  Difficulty urinating;   Skin: No rashes  Psychological: No history of anxiety,  No history of depression   Physical  Examination  There were no vitals filed for this visit.  There is no height or weight on file to calculate BMI.  General:  Alert and oriented, no acute distress HEENT: Normal Neck: No bruit or JVD Pulmonary: Clear to auscultation bilaterally Cardiac: Regular Rate and Rhythm without murmur Gastrointestinal: Soft, non-tender, non-distended, no mass, no scars Skin: No rash Extremity Pulses:  2+ radial, brachial pulses bilaterally Musculoskeletal: No deformity or edema  Neurologic: Upper and lower extremity motor 5/5 and symmetric  DATA:    ASSESSMENT:    PLAN:  Roxy Horseman PA-C [facility] Office: [phone]  MD in clinic [place]

## 2021-03-11 NOTE — Telephone Encounter (Signed)
They are refaxing the medical order for her dexcom- [phone]; call byram with question

## 2021-03-15 NOTE — Telephone Encounter (Signed)
Asked to assist with the Notice of denial of the Inpen. In reading the denial, it says she was denied because the following information was not provided or clarified: (1) The specific medical reasons why she is unable to use V-Go insulin patch or Omnipod Dash insulin pump.   Jane [name] is on too low of a basal amount of insulin to use the V-Go. Jane [name] said she is willing to think about using an Omnipod insulin pump.  I am contacting the Inpen rep to see if they can still get her an Inpen despite the denial.

## 2021-03-16 ENCOUNTER — Telehealth: Payer: Medicare Other

## 2021-03-17 ENCOUNTER — Encounter: Payer: Self-pay | Admitting: Internal Medicine

## 2021-03-17 ENCOUNTER — Other Ambulatory Visit (HOSPITAL_COMMUNITY): Payer: Self-pay

## 2021-03-17 MED ORDER — INPEN 100-PINK-LILLY-HUMALOG DEVI
1 refills | Status: DC
Start: 2021-03-17 — End: 2022-03-03

## 2021-03-17 MED ORDER — INSULIN LISPRO 100 UNIT/ML CARTRIDGE
SUBCUTANEOUS | 11 refills | Status: DC
  Filled 2021-03-17: qty 15, 37d supply, fill #0
  Filled 2021-04-01: qty 15, 38d supply, fill #0
  Filled 2021-04-01: qty 12, 28d supply, fill #0

## 2021-03-17 NOTE — Telephone Encounter (Signed)
Jane [name] can obtain the inpen for 35$ after we fax a prescription, letter stating her insurance denied it and a demographics sheet to [employer]. Request Humalog cartridges to use with the Inpen.

## 2021-03-19 ENCOUNTER — Telehealth: Payer: Medicare Other

## 2021-03-23 ENCOUNTER — Ambulatory Visit: Payer: Medicare Other | Admitting: *Deleted

## 2021-03-23 DIAGNOSIS — E083413 Diabetes mellitus due to underlying condition with severe nonproliferative diabetic retinopathy with macular edema, bilateral: Secondary | ICD-10-CM

## 2021-03-23 DIAGNOSIS — I10 Essential (primary) hypertension: Secondary | ICD-10-CM

## 2021-03-23 DIAGNOSIS — Z992 Dependence on renal dialysis: Secondary | ICD-10-CM

## 2021-03-23 DIAGNOSIS — N186 End stage renal disease: Secondary | ICD-10-CM

## 2021-03-23 DIAGNOSIS — Z794 Long term (current) use of insulin: Secondary | ICD-10-CM

## 2021-03-23 NOTE — Patient Instructions (Signed)
Visit Information It was nice speaking with you today. Goals Addressed            This Visit's Progress   . Make and Keep All Appointments       Timeframe:  Long-Range Goal Priority:  High Start Date:         10/30/20                    Expected End Date:    ongoing                   Follow Up Date 05/13/21   - ask family or friend for a ride - call to cancel if needed - keep a calendar with appointment dates    Why is this important?    Part of staying healthy is seeing the doctor for follow-up care.   If you forget your appointments, there are some things you can do to stay on track.    Notes: meeting goal       The patient verbalized understanding of instructions, educational materials, and care plan provided today and declined offer to receive copy of patient instructions, educational materials, and care plan.   The care management team will reach out to the patient again over the next 30-60 days.   Kelli Churn RN, CCM, [facility] RN Care Manager [phone]

## 2021-03-23 NOTE — Chronic Care Management (AMB) (Signed)
Care Management    RN Visit Note  03/23/2021 Name: Jane [name] MRN: [id number] DOB: 1976-03-16  Subjective: Jane [name] is a 45 y.o. year old female who is a primary care patient of Jane Lennert, MD. The care management team was consulted for assistance with disease management and care coordination needs.    Engaged with patient by telephone for follow up visit in response to provider referral for case management and/or care coordination services.   Consent to Services:   Jane [name] was given information about Care Management services today including:  1. Care Management services includes personalized support from designated clinical staff supervised by her physician, including individualized plan of care and coordination with other care providers 2. 24/7 contact phone numbers for assistance for urgent and routine care needs. 3. The patient may stop case management services at any time by phone call to the office staff.  Patient agreed to services and consent obtained.   Assessment: Review of patient past medical history, allergies, medications, health status, including review of consultants reports, laboratory and other test data, was performed as part of comprehensive evaluation and provision of chronic care management services.   SDOH (Social Determinants of Health) assessments and interventions performed:    Care Plan  Allergies  Allergen Reactions  . Adhesive [Tape] Other (See Comments)    Irritation     Outpatient Encounter Medications as of 03/23/2021  Medication Sig  . Accu-Chek Softclix Lancets lancets Check blood sugar up to 3 times  day  . acetaminophen (TYLENOL) 500 MG tablet Take 1,000 mg by mouth every 6 (six) hours as needed (for pain.).  Marland Kitchen albuterol (PROVENTIL HFA) 108 (90 Base) MCG/ACT inhaler INHALE 2 PUFFS BY MOUTH EVERY 4 HOURS AS NEEDED FOR COUGHING, WHEEZING, OR SHORTNESS OF BREATH  . amLODipine (NORVASC) 5 MG tablet TAKE 1 TABLET BY  MOUTH EVERY NIGHT AS DIRECTED  . amLODipine (NORVASC) 5 MG tablet Take 1 tablet (5 mg total) by mouth at bedtime.  Marland Kitchen aspirin EC 81 MG tablet Take 1 tablet (81 mg total) by mouth daily.  . blood glucose meter kit and supplies Dispense based on patient and insurance preference. Use up to four times daily as directed. (FOR ICD-10 E10.9, E11.9).  Marland Kitchen Blood Glucose Monitoring Suppl (ACCU-CHEK GUIDE) w/Device KIT 1 each by Does not apply route 3 (three) times daily.  . Blood Pressure Monitor MISC USE AS DIRECTED FOUR TIMES DAILY.  Marland Kitchen buPROPion (WELLBUTRIN SR) 100 MG 12 hr tablet Take 1 tablet (100 mg total) by mouth 2 (two) times daily.  . Cholecalciferol (VITAMIN D3) 50 MCG (2000 UT) CAPS Take 4 capsules (8,000 Units total) by mouth daily.  . Continuous Blood Gluc Receiver (DEXCOM G6 RECEIVER) DEVI Use to check blood sugar at least 6 times a day  . Continuous Blood Gluc Sensor (DEXCOM G6 SENSOR) MISC Use to check blood sugar at least 6 times a day  . Continuous Blood Gluc Transmit (DEXCOM G6 TRANSMITTER) MISC Use to check blood sugar at least 6 times a day  . cyclobenzaprine (FLEXERIL) 5 MG tablet Take 1 tablet (5 mg total) by mouth 3 (three) times daily as needed for muscle spasms.  . diclofenac Sodium (VOLTAREN) 1 % GEL APPLY 4 G TOPICALLY 4 (FOUR) TIMES DAILY.  Marland Kitchen gabapentin (NEURONTIN) 100 MG capsule TAKE 1 CAPSULE (100 MG TOTAL) BY MOUTH AT BEDTIME.  Marland Kitchen gabapentin (NEURONTIN) 100 MG capsule TAKE 1 CAPSULE BY MOUTH AT BEDTIME  . glucose blood (ACCU-CHEK GUIDE) test  strip Check blood sugar up to 3 times  day  . icosapent Ethyl (VASCEPA) 1 g capsule Take 2 capsules (2 g total) by mouth 2 (two) times daily.  . injection device for insulin (INPEN 100-PINK-LILLY-HUMALOG) DEVI Use to inject Humalog insulin for meals and correction insulin  . insulin degludec (TRESIBA) 100 UNIT/ML FlexTouch Pen Inject 24 Units into the skin daily.  . insulin lispro (HUMALOG) 100 UNIT/ML cartridge Use with inpen to inject at  meal time and for correction insulin up to 40 units daily  . insulin lispro (HUMALOG) 100 UNIT/ML KwikPen INJECT 4 TO 9 UNITS INTO THE SKIN THREE TIMES DAILY BASED ON CARB COUNTS AND CORRECTION SCALE FOR A MAXIMUM OF 24 UNITS DAILY.  Marland Kitchen Insulin Pen Needle 32G X 4 MM MISC Use to inject insulin 4 times a day  . Insulin Syringe-Needle U-100 31G X 15/64" 0.3 ML MISC Use to inject insulin daily  . megestrol (MEGACE) 40 MG tablet TAKE 2 TABLETS (80 MG TOTAL) BY MOUTH 2 (TWO) TIMES DAILY.  . metoprolol tartrate (LOPRESSOR) 25 MG tablet Take 1 tablet (25 mg total) by mouth 2 (two) times daily. Do not take the night before dialysis, and take 2 hours after dialysis.  Marland Kitchen mirtazapine (REMERON) 15 MG tablet TAKE 1 TABLET BY MOUTH ONCE DAILY AT NIGHT.  Marland Kitchen multivitamin (RENA-VIT) TABS tablet Take 1 tablet by mouth daily.  . naloxone (NARCAN) nasal spray 4 mg/0.1 mL Place 1 spray into the nose as needed, if found unresponsive then spray into nose and call 911.  Marland Kitchen oxyCODONE-acetaminophen (PERCOCET) 10-325 MG tablet TAKE 1 TABLET BY MOUTH 4 TIMES A DAY AS NEEDED  . oxyCODONE-acetaminophen (PERCOCET) 10-325 MG tablet Take 1 tablet by mouth 4 (four) times daily as needed.  Marland Kitchen PRESCRIPTION MEDICATION at bedtime.  Marland Kitchen RENAGEL 800 MG tablet Take 4,800 mg by mouth See admin instructions. Take 4800 mg tablets three times daily with meals  . rosuvastatin (CRESTOR) 40 MG tablet Take 1 tablet (40 mg total) by mouth daily. (Patient taking differently: Take 40 mg by mouth at bedtime. )  . sertraline (ZOLOFT) 100 MG tablet Take 100 mg by mouth daily.   No facility-administered encounter medications on file as of 03/23/2021.    Patient Active Problem List   Diagnosis Date Noted  . Diabetic neuropathy ([place]) 01/29/2021  . Neck pain 01/07/2021  . Chronic diastolic heart failure ([place]) 01/06/2021  . Acute COVID-19 12/16/2020  . COVID-19 12/14/2020  . Left lateral abdominal pain 02/02/2020  . Ulcer of left foot due to type 2 diabetes  mellitus ([place]) 06/03/2019  . Diabetic foot ulcer ([place]) 04/22/2019  . Arthritis of left knee 02/11/2019  . 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 ([place]) 05/23/2018  . DOE (dyspnea on exertion) 02/26/2018  . Hidradenitis suppurativa 02/12/2018  . Anemia in chronic kidney disease 02/12/2018  . Secondary hyperparathyroidism of renal origin ([place]) 01/30/2018  . Coagulation defect, unspecified ([place]) 01/18/2018  . Major depressive disorder 08/11/2017  . Sinus tachycardia 02/23/2017  . ESRD (end stage renal disease) on dialysis ([place]) 01/05/2017  . CAD S/P BMS PCI to prox LAD 03/31/2016  . Abnormal uterine bleeding (AUB) 03/17/2016  . PAOD (peripheral arterial occlusive disease) ([place]) 01/08/2016  . Charcot foot due to diabetes mellitus ([place]) 09/07/2015  . Peripheral neuropathy 09/20/2014  . Essential hypertension 08/16/2014  . Seasonal allergic rhinitis 08/16/2014  . [place] vulvovaginitis 02/09/2014  . Morbid obesity ([place]) 09/26/2012  . Hyperlipidemia associated with type 2 diabetes mellitus ([place])  02/26/2009  . 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 Asthma 05/08/2007    Conditions to be addressed/monitored: CAD, ESRD with 32 oz fluid restrticiton per day, IDDM, HLD, obesity, depression, Covid + on 12/14/20   Care Plan : CCM RN Diabetes Type 2 (Adult)  Updates made by Barrington Ellison, RN since 03/23/2021 12:00 AM    Problem: Disease Progression (Diabetes, Type 2)     Goal: Disease Progression Prevented or Minimized   Start Date: 01/31/2020  Expected End Date: 05/13/2021  Recent Progress: On track  Priority: Low  Note:   CARE PLAN ENTRY (see longitudinal plan of care for additional care plan information)  Current Barriers:  . Chronic Disease Management support, education, and care coordination needs related to CAD, HTN, HLD, DMII, and ESRD- patient states her Mom died rather  suddenly on 04/03/21 and she feels she likes she is doing well handling her grief, she is wearing the Dexcom CGM and says it is providing her and her provider with "lots of good information", she reports the lowest reading from the CGM is 2 with symptoms of sweating and shakiness while she was dialysis which she treated with candy,  she says she continues to attend a pain management clinic for he neck pain associated with scoliosis, says she will start OP rehab this week  and continues to use oxycodone to treat her neck and knee pain, says she has an appointment on 5/12 to have her chronic knee pain evaluated, she says she is taking her medications as prescribed and despite being instructed to skip her BP medication the day before dialysis she takes it and her BP at dialysis has been much better, she continues to meet with her counselor in a regular basis.  Clinical Goal(s) related to CAD, HTN, HLD, DMII, and ESRD:  Over the next 30-60 days, patient will:  . Work with the care management team to address educational, disease management, and care coordination needs  . Begin or continue self health monitoring activities as directed today . Call provider office for new or worsened signs and symptoms New or worsened symptom related to DM, HTN, CAD, ESRD . Call care management team with questions or concerns . Verbalize basic understanding of patient centered plan of care established today  Interventions related to CAD, HTN, HLD, DMII, and ESRD:  . Prior to interviewing patient, reviewed patient status, including review of recent office visit notes, consultants reports, relevant laboratory and other test results, and medications, completed. . Provided patient with the opportunity to discuss the death of her mother and emotional support provided . Assessed patient's self management skills in regards to HTN, IDDM, HLD, CAD and ESRD  . Evaluation of current treatment plan related to HTN and IDDM, HLD and ESRD  and patient's adherence to plan as established by provider. . Reviewed medications with patient and assessed medication taking behavior . Assessed patient's satisfaction with CGM Rx and reviewed episodes of hypoglycemia and hyperglycemia . Reviewed how to treat hypoglycemia and encouraged patient to carry sources of quick sugar with her at all times especially when at dialysis center  . Much positive reinforcement given to patient for taking charge of her health issues and using the tools and her knowledge to improve her health despite the deaths of close friends and family in the last year . Reminded patient to contact this CCM RN for any questions or concerns that may arise before  next month's call . Reviewed upcoming appointments including: clinic appointment on 5/18  Patient Self Care Activities related to CAD, HTN, HLD, DMII, and ESRD:  . Patient is unable to independently self-manage chronic health conditions      Plan: The care management team will reach out to the patient again over the next 30-60 days.  Kelli Churn RN, CCM, [facility] RN Care Manager [phone]

## 2021-03-24 ENCOUNTER — Telehealth: Payer: Self-pay | Admitting: Dietician

## 2021-03-24 NOTE — Telephone Encounter (Signed)
Jane [name] confirms she got a box of CGM supplies. She has not gotten her Inpen but spoke to someone from [employer] today. She agreed to schedule an appointment with me when she gets it to set up the therapy settings.

## 2021-03-25 ENCOUNTER — Other Ambulatory Visit (HOSPITAL_COMMUNITY): Payer: Self-pay

## 2021-03-26 ENCOUNTER — Other Ambulatory Visit (HOSPITAL_COMMUNITY): Payer: Self-pay

## 2021-03-26 MED ORDER — OXYCODONE-ACETAMINOPHEN 10-325 MG PO TABS
1.0000 | ORAL_TABLET | Freq: Four times a day (QID) | ORAL | 0 refills | Status: DC | PRN
  Filled 2021-03-26: qty 120, 30d supply, fill #0

## 2021-03-27 ENCOUNTER — Other Ambulatory Visit (HOSPITAL_COMMUNITY): Payer: Self-pay

## 2021-03-31 ENCOUNTER — Ambulatory Visit: Payer: Medicare Other | Admitting: Dietician

## 2021-03-31 ENCOUNTER — Encounter: Payer: Medicare Other | Admitting: Internal Medicine

## 2021-04-01 ENCOUNTER — Other Ambulatory Visit (HOSPITAL_COMMUNITY): Payer: Self-pay

## 2021-04-01 ENCOUNTER — Other Ambulatory Visit: Payer: Self-pay

## 2021-04-01 ENCOUNTER — Encounter: Payer: Self-pay | Admitting: Internal Medicine

## 2021-04-01 ENCOUNTER — Ambulatory Visit (INDEPENDENT_AMBULATORY_CARE_PROVIDER_SITE_OTHER): Payer: Medicare Other | Admitting: Internal Medicine

## 2021-04-01 ENCOUNTER — Telehealth: Payer: Self-pay | Admitting: Dietician

## 2021-04-01 VITALS — BP 154/75 | HR 102 | Temp 98.2°F | Ht 65.0 in | Wt 228.2 lb

## 2021-04-01 DIAGNOSIS — Z794 Long term (current) use of insulin: Secondary | ICD-10-CM

## 2021-04-01 DIAGNOSIS — F321 Major depressive disorder, single episode, moderate: Secondary | ICD-10-CM

## 2021-04-01 DIAGNOSIS — E083413 Diabetes mellitus due to underlying condition with severe nonproliferative diabetic retinopathy with macular edema, bilateral: Secondary | ICD-10-CM | POA: Diagnosis not present

## 2021-04-01 DIAGNOSIS — F4321 Adjustment disorder with depressed mood: Secondary | ICD-10-CM | POA: Insufficient documentation

## 2021-04-01 LAB — POCT GLYCOSYLATED HEMOGLOBIN (HGB A1C): Hemoglobin A1C: 6.8 % — AB (ref 4.0–5.6)

## 2021-04-01 LAB — GLUCOSE, CAPILLARY: Glucose-Capillary: 177 mg/dL — ABNORMAL HIGH (ref 70–99)

## 2021-04-01 MED ORDER — ONDANSETRON HCL 4 MG PO TABS
4.0000 mg | ORAL_TABLET | Freq: Three times a day (TID) | ORAL | 0 refills | Status: DC | PRN
  Filled 2021-04-01: qty 20, 7d supply, fill #0

## 2021-04-01 MED ORDER — SERTRALINE HCL 100 MG PO TABS
50.0000 mg | ORAL_TABLET | Freq: Every day | ORAL | 2 refills | Status: DC
  Filled 2021-04-01: qty 15, 30d supply, fill #0

## 2021-04-01 NOTE — Telephone Encounter (Signed)
Left a message offered to assist with her Inpen set up.called pharmacy to request cartridges of Humalog be ready for pick up.

## 2021-04-01 NOTE — Assessment & Plan Note (Signed)
Lab Results  Component Value Date   HGBA1C 6.8 (A) 04/01/2021   Jane [name] states that she has been doing well since the placement of her continuous glucose monitor.  She continues to take [place] nightly but is only taking 22 units at this time.  We downloaded her CGM data and reviewed it together.  Her average sugars around 180 with 50% within goal range.  She is having no lows, but is above range approximately 50%.  We discussed plan to start the in pen today and Jane [name] will contact her with instructions.  Assessment/plan: A1c is improved although given patient's ESRD status, it is still underestimated.  I suspect that her average CBGs will improve greatly once she starts in pen for her short acting insulin.  At this time, we will plan to for 2-4 units for breakfast lunch and dinner depending on carb load and 1 to 3 units for snacks depending on carb load.  - Continue Tresiba, increase to 24 units at bedtime - Start short acting insulin via in pen instructions with dose between 1 to 4 units depending on meal time and size - Follow-up in 4 weeks

## 2021-04-01 NOTE — Patient Instructions (Addendum)
It was nice seeing you today! Thank you for choosing [facility] for your Primary Care.    Today we talked about:   1. Grief: Please reach out if there is anything we can do to help you through this time.   2. Diabetes: [name] will call you to walk you through the In Pen process. Continue taking Tresiba and increase to 24 units.   3. Restart your Sertraline today. We will start with half a tablet.

## 2021-04-01 NOTE — Assessment & Plan Note (Signed)
[name] states that her depression feels exacerbated by her mother's recent passing (please see separate assessment and plan for details.  She notes that this time she is no longer taking sertraline or bupropion but she is meeting with her therapist regularly.  She is interested in restarting medication therapy.  Assessment/plan: [place] Office Visit from 01/29/2021 in [place]  PHQ-9 Total Score 9     - Restart sertraline at 50 mg - 4-week follow-up

## 2021-04-01 NOTE — Assessment & Plan Note (Addendum)
Jane [name] states that she has been dealing with grief since the sudden unexpected passing of her mother after short stay in the ICU into cardiac arrest approximately 3 weeks ago.  She states her and her mother were extremely close and talk to daily.  During this time, she has been having trouble focusing and concentrating, difficulty sleeping.  She is also experiencing nausea and aversion to food.  She feels she has a good support system with her children and is able to talk to her sisters at times.  She also has a therapist that she has met with twice already.  Assessment/plan: We discussed the symptoms that can be expected while grieving, re-assuring Jane [name] that poor concentration and focus is expected.    -Given that patient has a long-term history of depression, will restart her sertraline at this time, however strongly recommended she continue to follow with her therapist to help her through this grief.   -We will also do a short course of Zofran for food aversion and nausea.  QTc of last EKG evaluated and within normal limits.

## 2021-04-01 NOTE — Progress Notes (Signed)
   CC: T2DM  HPI:  Jane [name] is a 45 y.o. with a PMHx as listed below who presents to the clinic for T2DM.   Please see the Encounters tab for problem-based Assessment & Plan regarding status of patient's acute and chronic conditions.  Past Medical History:  Diagnosis Date  . Abnormal uterine bleeding (AUB)   . Asthma    prn inhaler  . CAD S/P BMS PCI to prox LAD cardiologist--- dr Beverlyn Roux LAD to Mid LAD lesion, 90% stenosed. Post intervention - Vision BMS 3.0 mm x 18 mm (~3.5 mm) there is a 0% residual stenosis. ;   nuclear stress test-- 09-13-2018 low risk with no ischemia, nuclear ef 56%  . Charcot foot due to diabetes mellitus ([place]) 11/2016   left 2018; right 2019  . Depression   . Diabetic neuropathy (HCC)    feet  . Diabetic retinopathy, nonproliferative, severe ([place])    bilateral  . ESRD (end stage renal disease) on dialysis [facility])    ?chronic interstitial nephritis  [facility]  . H/O non-ST elevation myocardial infarction (NSTEMI) 03/2016   Found in 90% mid LAD lesion treated with bare-metal stent (BMS) PCI - vision BMS 3.0 mm x 18 mm  . History of MRSA infection 2007  . Hyperlipidemia   . Hypertension   . IDA (iron deficiency anemia)   . Insulin dependent type 2 diabetes mellitus ([place])   . Iron deficiency anemia    takes iron supplement  . PAOD (peripheral arterial occlusive disease) ([place])    vascular--- dr Bridgett Larsson  . S/P arteriovenous (AV) fistula creation 07/2017  . S/p bare metal coronary artery stent 03/31/2016   BMS x1  to pLAD  . Sickle cell trait ([place])    Review of Systems: Review of Systems  Constitutional: Negative for fever.  Cardiovascular: Negative for chest pain and palpitations.  Gastrointestinal: Positive for nausea. Negative for abdominal pain and vomiting.  Neurological: Negative for dizziness and headaches.  Psychiatric/Behavioral: Positive for depression. Negative for suicidal ideas. The patient is  nervous/anxious and has insomnia.    Physical Exam:  Vitals:   04/01/21 1107  BP: (!) 154/75  Pulse: (!) 102  Temp: 98.2 F (36.8 C)  TempSrc: Oral  SpO2: 100%  Weight: 228 lb 3.2 oz (103.5 kg)  Height: 5\' 5"  (1.651 m)   Physical Exam Vitals and nursing note reviewed.  Constitutional:      General: She is not in acute distress.    Appearance: She is obese.  Skin:    General: Skin is warm and dry.  Neurological:     Mental Status: She is alert.  Psychiatric:        Attention and Perception: Attention normal.        Mood and Affect: Mood is anxious. Affect is tearful.        Speech: Speech normal.        Behavior: Behavior normal. Behavior is cooperative.        Thought Content: Thought content normal.        Cognition and Memory: Cognition and memory normal.        Judgment: Judgment normal.     Assessment & Plan:   See Encounters Tab for problem based charting.  Patient discussed with Dr. Dareen Piano

## 2021-04-02 ENCOUNTER — Other Ambulatory Visit (HOSPITAL_COMMUNITY): Payer: Self-pay

## 2021-04-05 NOTE — Progress Notes (Signed)
Internal Medicine Clinic Attending  Case discussed with Dr. Basaraba  At the time of the visit.  We reviewed the resident's history and exam and pertinent patient test results.  I agree with the assessment, diagnosis, and plan of care documented in the resident's note.  

## 2021-04-06 ENCOUNTER — Ambulatory Visit (INDEPENDENT_AMBULATORY_CARE_PROVIDER_SITE_OTHER): Payer: Medicare Other | Admitting: Physician Assistant

## 2021-04-06 ENCOUNTER — Other Ambulatory Visit: Payer: Self-pay | Admitting: Cardiology

## 2021-04-06 ENCOUNTER — Encounter: Payer: Self-pay | Admitting: *Deleted

## 2021-04-06 ENCOUNTER — Other Ambulatory Visit (HOSPITAL_COMMUNITY): Payer: Self-pay

## 2021-04-06 ENCOUNTER — Other Ambulatory Visit: Payer: Self-pay

## 2021-04-06 ENCOUNTER — Other Ambulatory Visit: Payer: Self-pay | Admitting: *Deleted

## 2021-04-06 VITALS — BP 155/83 | HR 92 | Temp 97.9°F | Resp 20 | Ht 65.0 in | Wt 229.8 lb

## 2021-04-06 DIAGNOSIS — N186 End stage renal disease: Secondary | ICD-10-CM

## 2021-04-06 DIAGNOSIS — Z992 Dependence on renal dialysis: Secondary | ICD-10-CM

## 2021-04-06 MED ORDER — METOPROLOL TARTRATE 25 MG PO TABS
25.0000 mg | ORAL_TABLET | Freq: Two times a day (BID) | ORAL | 3 refills | Status: DC
  Filled 2021-04-06 – 2021-04-28 (×3): qty 180, 90d supply, fill #0
  Filled 2021-07-28: qty 180, 90d supply, fill #1

## 2021-04-06 NOTE — Progress Notes (Signed)
Office Note     CC:  follow up Requesting Provider:  [name], MD  HPI: Jane [name] is a 45 y.o. (1976/05/17) female who presents for evaluation of right brachiocephalic fistula.  This was initially created by Dr. Vallarie Mare in September 2018.  She then required superficialization of right brachiocephalic fistula in January 2019.  She has not been seen since revision surgery.  She presents with complaints of prolonged needle hole bleeding and aneurysmal enlargement at stick sites.  She has had a fistulogram performed by [facility] in the past however this has been more than a year ago.  She is dialyzing on a Monday Wednesday Friday at the [facility] location under the management of Dr. Joelyn Oms.  Past Medical History:  Diagnosis Date  . Abnormal uterine bleeding (AUB)   . Asthma    prn inhaler  . CAD S/P BMS PCI to prox LAD cardiologist--- dr Beverlyn Roux LAD to Mid LAD lesion, 90% stenosed. Post intervention - Vision BMS 3.0 mm x 18 mm (~3.5 mm) there is a 0% residual stenosis. ;   nuclear stress test-- 09-13-2018 low risk with no ischemia, nuclear ef 56%  . Charcot foot due to diabetes mellitus ([place]) 11/2016   left 2018; right 2019  . Depression   . Diabetic neuropathy (HCC)    feet  . Diabetic retinopathy, nonproliferative, severe ([place])    bilateral  . ESRD (end stage renal disease) on dialysis [facility])    ?chronic interstitial nephritis  [facility]  . H/O non-ST elevation myocardial infarction (NSTEMI) 03/2016   Found in 90% mid LAD lesion treated with bare-metal stent (BMS) PCI - vision BMS 3.0 mm x 18 mm  . History of MRSA infection 2007  . Hyperlipidemia   . Hypertension   . IDA (iron deficiency anemia)   . Insulin dependent type 2 diabetes mellitus ([place])   . Iron deficiency anemia    takes iron supplement  . PAOD (peripheral arterial occlusive disease) ([place])    vascular--- dr Bridgett Larsson  . S/P arteriovenous (AV) fistula creation 07/2017  . S/p bare  metal coronary artery stent 03/31/2016   BMS x1  to pLAD  . Sickle cell trait [facility])     Past Surgical History:  Procedure Laterality Date  . ACHILLES TENDON LENGTHENING Left 11/24/2016   Procedure: Left Achilles tendon lengthening (open);  Surgeon: Wylene Simmer, MD;  Location: [place];  Service: Orthopedics;  Laterality: Left;  . AV FISTULA PLACEMENT Right 07/31/2017   Procedure: ARTERIOVENOUS BRACHIOCEPHALIC FISTULA CREATION RIGHT ARM;  Surgeon: Conrad Ulster, MD;  Location: [place];  Service: Vascular;  Laterality: Right;  . CALCANEAL OSTEOTOMY Left 11/24/2016   Procedure: Left hindfoot osteotomy and fusion;  Surgeon: Wylene Simmer, MD;  Location: [place];  Service: Orthopedics;  Laterality: Left;  . CARDIAC CATHETERIZATION N/A 03/31/2016   Procedure: Left Heart Cath and Coronary Angiography;  Surgeon: Lorretta Harp, MD;  Location: [facility] INVASIVE CV LAB: 90% early mLAD, normal LV Fxn  . CARDIAC CATHETERIZATION N/A 03/31/2016   Procedure: Coronary Stent Intervention;  Surgeon: Lorretta Harp, MD;  Location: [place] CV LAB;  Service: Cardiovascular: PCI to mLAD BMS Vision 3.0 mm x 18 mm  . CESAREAN SECTION  1997  . DILITATION & CURRETTAGE/HYSTROSCOPY WITH NOVASURE ABLATION N/A 09/15/2020   Procedure: DILATATION & CURETTAGE/HYSTEROSCOPY;  Surgeon: Lavonia Drafts, MD;  Location: [place];  Service: Gynecology;  Laterality: N/A;  . FISTULA SUPERFICIALIZATION  Right 11/15/2017   Procedure: FISTULA SUPERFICIALIZATION RIGHT BRACHIOCEPHALIC  RIGHT;  Surgeon: Conrad Odem, MD;  Location: [place];  Service: Vascular;  Laterality: Right;  . FOOT SURGERY     multiple for [place]  . TUBAL LIGATION  1997    Social History   Socioeconomic History  . Marital status: Single    Spouse name: Not on file  . Number of children: 2  . Years of education: 52  . Highest education level: Not on file  Occupational History  . Occupation:   Mining engineer: [employer]  Tobacco Use  . Smoking status: Former Smoker    Packs/day: 0.00    Years: 0.50    Pack years: 0.00    Types: Cigarettes    Quit date: 08/29/2015    Years since quitting: 5.6  . Smokeless tobacco: Never Used  Vaping Use  . Vaping Use: Never used  Substance and Sexual Activity  . Alcohol use: Yes    Alcohol/week: 0.0 standard drinks    Comment: occasional  . Drug use: Yes    Types: Marijuana    Comment: 09-08-2020 per pt seldom smokes  . Sexual activity: Yes    Birth control/protection: Surgical  Other Topics Concern  . Not on file  Social History Narrative   Patient does not drink caffeine.   Patient is right handed.       --> 2020 was a difficult year: Her sister died in 27-Mar-2023 (potentially because of COVID-19), her recent ex-boyfriend died in either 02/25/23 or 03/27/2023, this was followed by one of her close friends being murdered by the boyfriend.    --> Good news for 2020 was that she had a new grandkids.   Social Determinants of Health   Financial Resource Strain: Not on file  Food Insecurity: Food Insecurity Present  . Worried About Charity fundraiser in the Last Year: Never true  . Ran Out of Food in the Last Year: Sometimes true  Transportation Needs: No Transportation Needs  . Lack of Transportation (Medical): No  . Lack of Transportation (Non-Medical): No  Physical Activity: Not on file  Stress: Not on file  Social Connections: Not on file  Intimate Partner Violence: Not on file    Family History  Problem Relation Age of Onset  . Stroke Mother   . Diabetes Mother   . Hypertension Mother   . Aneurysm Mother   . Diabetes Father   . Hypertension Father   . Diabetes Sister   . Hypertension Sister   . Diabetes Maternal Grandmother   . Diabetes Maternal Grandfather   . Cancer Paternal Grandfather        Prostate    Current Outpatient Medications  Medication Sig Dispense Refill  . Accu-Chek Softclix Lancets lancets Check blood sugar up  to 3 times  day 300 each 3  . acetaminophen (TYLENOL) 500 MG tablet Take 1,000 mg by mouth every 6 (six) hours as needed (for pain.).    Marland Kitchen albuterol (PROVENTIL HFA) 108 (90 Base) MCG/ACT inhaler INHALE 2 PUFFS BY MOUTH EVERY 4 HOURS AS NEEDED FOR COUGHING, WHEEZING, OR SHORTNESS OF BREATH 20.1 g 2  . amLODipine (NORVASC) 5 MG tablet TAKE 1 TABLET BY MOUTH EVERY NIGHT AS DIRECTED 90 tablet 3  . aspirin EC 81 MG tablet Take 1 tablet (81 mg total) by mouth daily. 90 tablet 3  . blood glucose meter kit and supplies Dispense based on patient and insurance preference. Use up  to four times daily as directed. (FOR ICD-10 E10.9, E11.9). 1 each 0  . Blood Glucose Monitoring Suppl (ACCU-CHEK GUIDE) w/Device KIT 1 each by Does not apply route 3 (three) times daily. 1 kit 0  . Blood Pressure Monitor MISC USE AS DIRECTED FOUR TIMES DAILY. (Patient taking differently: 4 (four) times daily. as directed) 1 each 0  . Cholecalciferol (VITAMIN D3) 50 MCG (2000 UT) CAPS Take 4 capsules (8,000 Units total) by mouth daily. 360 capsule 3  . Continuous Blood Gluc Receiver (DEXCOM G6 RECEIVER) DEVI Use to check blood sugar at least 6 times a day 1 each 0  . Continuous Blood Gluc Sensor (DEXCOM G6 SENSOR) MISC Use to check blood sugar at least 6 times a day 9 each 3  . Continuous Blood Gluc Transmit (DEXCOM G6 TRANSMITTER) MISC Use to check blood sugar at least 6 times a day 1 each 3  . diclofenac Sodium (VOLTAREN) 1 % GEL APPLY 4 G TOPICALLY 4 (FOUR) TIMES DAILY. 100 g 0  . gabapentin (NEURONTIN) 100 MG capsule TAKE 1 CAPSULE (100 MG TOTAL) BY MOUTH AT BEDTIME. 30 capsule 0  . glucose blood (ACCU-CHEK GUIDE) test strip Check blood sugar up to 3 times  day 300 each 3  . injection device for insulin (INPEN 100-PINK-LILLY-HUMALOG) DEVI Use to inject Humalog insulin for meals and correction insulin 1 each 1  . insulin degludec (TRESIBA) 100 UNIT/ML FlexTouch Pen Inject 24 Units into the skin daily. 15 mL 1  . insulin lispro  (HUMALOG) 100 UNIT/ML KwikPen INJECT 4 TO 9 UNITS INTO THE SKIN THREE TIMES DAILY BASED ON CARB COUNTS AND CORRECTION SCALE FOR A MAXIMUM OF 24 UNITS DAILY. 15 mL 11  . Insulin Pen Needle 32G X 4 MM MISC Use to inject insulin 4 times a day 400 each 3  . Insulin Syringe-Needle U-100 31G X 15/64" 0.3 ML MISC Use to inject insulin daily 100 each 3  . megestrol (MEGACE) 40 MG tablet TAKE 2 TABLETS (80 MG TOTAL) BY MOUTH 2 (TWO) TIMES DAILY. 120 tablet 8  . multivitamin (RENA-VIT) TABS tablet Take 1 tablet by mouth daily.  3  . naloxone (NARCAN) nasal spray 4 mg/0.1 mL Place 1 spray into the nose as needed, if found unresponsive then spray into nose and call 911. 2 each 12  . ondansetron (ZOFRAN) 4 MG tablet Take 1 tablet (4 mg total) by mouth every 8 (eight) hours as needed for nausea or vomiting. 20 tablet 0  . oxyCODONE-acetaminophen (PERCOCET) 10-325 MG tablet TAKE 1 TABLET BY MOUTH 4 TIMES A DAY AS NEEDED 60 tablet 0  . oxyCODONE-acetaminophen (PERCOCET) 10-325 MG tablet Take 1 tablet by mouth 4 (four) times daily as needed. 120 tablet 0  . PRESCRIPTION MEDICATION at bedtime.    Marland Kitchen RENAGEL 800 MG tablet Take 4,800 mg by mouth See admin instructions. Take 4800 mg tablets three times daily with meals  11  . sertraline (ZOLOFT) 100 MG tablet Take 0.5 tablets (50 mg total) by mouth daily. 15 tablet 2  . sevelamer carbonate (RENVELA) 800 MG tablet Take by mouth.    . metoprolol tartrate (LOPRESSOR) 25 MG tablet Take 1 tablet (25 mg total) by mouth 2 (two) times daily. Do not take the night before dialysis, and take 2 hours after dialysis. 180 tablet 3   No current facility-administered medications for this visit.    Allergies  Allergen Reactions  . Adhesive [Tape] Other (See Comments)    Irritation  REVIEW OF SYSTEMS:   '[X]'$  denotes positive finding, $RemoveBeforeDEI'[ ]'[id number]$  denotes negative finding Cardiac  Comments:  Chest pain or chest pressure:    Shortness of breath upon exertion:    Short of breath  when lying flat:    Irregular heart rhythm:        Vascular    Pain in calf, thigh, or hip brought on by ambulation:    Pain in feet at night that wakes you up from your sleep:     Blood clot in your veins:    Leg swelling:         Pulmonary    Oxygen at home:    Productive cough:     Wheezing:         Neurologic    Sudden weakness in arms or legs:     Sudden numbness in arms or legs:     Sudden onset of difficulty speaking or slurred speech:    Temporary loss of vision in one eye:     Problems with dizziness:         Gastrointestinal    Blood in stool:     Vomited blood:         Genitourinary    Burning when urinating:     Blood in urine:        Psychiatric    Major depression:         Hematologic    Bleeding problems:    Problems with blood clotting too easily:        Skin    Rashes or ulcers:        Constitutional    Fever or chills:      PHYSICAL EXAMINATION:  Vitals:   04/06/21 0913  BP: (!) 155/83  Pulse: 92  Resp: 20  Temp: 97.9 F (36.6 C)  TempSrc: Temporal  SpO2: 98%  Weight: 229 lb 12.8 oz (104.2 kg)  Height: $Remove'5\' 5"'QwgGAgw$  (1.651 m)    General:  WDWN in NAD; vital signs documented above Gait: Not observed HENT: WNL, normocephalic Pulmonary: normal non-labored breathing  Cardiac: regular HR Abdomen: soft, NT, no masses Skin: without rashes Vascular Exam/Pulses:  Right Left  Radial 2+ (normal) 2+ (normal)   Extremities: Mild aneurysmal enlargement at stick sites; still with some needle hole bleeding after bandage was removed during visit today; easily palpable thrill near the anastomosis however thrill is less prominent as you approach the axilla Musculoskeletal: no muscle wasting or atrophy  Neurologic: A&O X 3;  No focal weakness or paresthesias are detected Psychiatric:  The pt has Normal affect.   ASSESSMENT/PLAN:: 45 y.o. 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   -The patient is aware the risks include but are not limited to: bleeding, nerve damage, thrombosis, and need for additional procedures.   -The patient agrees to proceed with the procedure.    Dagoberto Ligas, PA-C [facility] [phone]  Clinic MD:   Carlis Abbott

## 2021-04-06 NOTE — H&P (View-Only) (Signed)
Office Note     CC:  follow up Requesting Provider:  [name], MD  HPI: Jane [name] is a 45 y.o. ([birth year]) female who presents for evaluation of right brachiocephalic fistula.  This was initially created by Dr. Vallarie Mare in September 2018.  She then required superficialization of right brachiocephalic fistula in January 2019.  She has not been seen since revision surgery.  She presents with complaints of prolonged needle hole bleeding and aneurysmal enlargement at stick sites.  She has had a fistulogram performed by [facility] in the past however this has been more than a year ago.  She is dialyzing on a Monday Wednesday Friday at the [facility] location under the management of Dr. Joelyn Oms.  Past Medical History:  Diagnosis Date  . Abnormal uterine bleeding (AUB)   . Asthma    prn inhaler  . CAD S/P BMS PCI to prox LAD cardiologist--- dr Beverlyn Roux LAD to Mid LAD lesion, 90% stenosed. Post intervention - Vision BMS 3.0 mm x 18 mm (~3.5 mm) there is a 0% residual stenosis. ;   nuclear stress test-- 09-13-2018 low risk with no ischemia, nuclear ef 56%  . Charcot foot due to diabetes mellitus ([place]) 11/2016   left 2018; right 2019  . Depression   . Diabetic neuropathy (HCC)    feet  . Diabetic retinopathy, nonproliferative, severe ([place])    bilateral  . ESRD (end stage renal disease) on dialysis [facility])    ?chronic interstitial nephritis  [facility]  . H/O non-ST elevation myocardial infarction (NSTEMI) 03/2016   Found in 90% mid LAD lesion treated with bare-metal stent (BMS) PCI - vision BMS 3.0 mm x 18 mm  . History of MRSA infection 2007  . Hyperlipidemia   . Hypertension   . IDA (iron deficiency anemia)   . Insulin dependent type 2 diabetes mellitus ([place])   . Iron deficiency anemia    takes iron supplement  . PAOD (peripheral arterial occlusive disease) ([place])    vascular--- dr Bridgett Larsson  . S/P arteriovenous (AV) fistula creation 07/2017  . S/p bare  metal coronary artery stent 03/31/2016   BMS x1  to pLAD  . Sickle cell trait [facility])     Past Surgical History:  Procedure Laterality Date  . ACHILLES TENDON LENGTHENING Left 11/24/2016   Procedure: Left Achilles tendon lengthening (open);  Surgeon: Wylene Simmer, MD;  Location: [place];  Service: Orthopedics;  Laterality: Left;  . AV FISTULA PLACEMENT Right 07/31/2017   Procedure: ARTERIOVENOUS BRACHIOCEPHALIC FISTULA CREATION RIGHT ARM;  Surgeon: Conrad Micro, MD;  Location: [place];  Service: Vascular;  Laterality: Right;  . CALCANEAL OSTEOTOMY Left 11/24/2016   Procedure: Left hindfoot osteotomy and fusion;  Surgeon: Wylene Simmer, MD;  Location: [place];  Service: Orthopedics;  Laterality: Left;  . CARDIAC CATHETERIZATION N/A 03/31/2016   Procedure: Left Heart Cath and Coronary Angiography;  Surgeon: Lorretta Harp, MD;  Location: [facility] INVASIVE CV LAB: 90% early mLAD, normal LV Fxn  . CARDIAC CATHETERIZATION N/A 03/31/2016   Procedure: Coronary Stent Intervention;  Surgeon: Lorretta Harp, MD;  Location: [place] CV LAB;  Service: Cardiovascular: PCI to mLAD BMS Vision 3.0 mm x 18 mm  . CESAREAN SECTION  1997  . DILITATION & CURRETTAGE/HYSTROSCOPY WITH NOVASURE ABLATION N/A 09/15/2020   Procedure: DILATATION & CURETTAGE/HYSTEROSCOPY;  Surgeon: Lavonia Drafts, MD;  Location: [place];  Service: Gynecology;  Laterality: N/A;  . FISTULA SUPERFICIALIZATION  Right 11/15/2017   Procedure: FISTULA SUPERFICIALIZATION RIGHT BRACHIOCEPHALIC  RIGHT;  Surgeon: Conrad Bath, MD;  Location: [place];  Service: Vascular;  Laterality: Right;  . FOOT SURGERY     multiple for [place]  . TUBAL LIGATION  1997    Social History   Socioeconomic History  . Marital status: Single    Spouse name: Not on file  . Number of children: 2  . Years of education: 61  . Highest education level: Not on file  Occupational History  . Occupation:   Mining engineer: [employer]  Tobacco Use  . Smoking status: Former Smoker    Packs/day: 0.00    Years: 0.50    Pack years: 0.00    Types: Cigarettes    Quit date: 08/29/2015    Years since quitting: 5.6  . Smokeless tobacco: Never Used  Vaping Use  . Vaping Use: Never used  Substance and Sexual Activity  . Alcohol use: Yes    Alcohol/week: 0.0 standard drinks    Comment: occasional  . Drug use: Yes    Types: Marijuana    Comment: 09-08-2020 per pt seldom smokes  . Sexual activity: Yes    Birth control/protection: Surgical  Other Topics Concern  . Not on file  Social History Narrative   Patient does not drink caffeine.   Patient is right handed.       --> 2020 was a difficult year: Her sister died in 03/29/23 (potentially because of COVID-19), her recent ex-boyfriend died in either 02-27-2023 or 03-29-2023, this was followed by one of her close friends being murdered by the boyfriend.    --> Good news for 2020 was that she had a new grandkids.   Social Determinants of Health   Financial Resource Strain: Not on file  Food Insecurity: Food Insecurity Present  . Worried About Charity fundraiser in the Last Year: Never true  . Ran Out of Food in the Last Year: Sometimes true  Transportation Needs: No Transportation Needs  . Lack of Transportation (Medical): No  . Lack of Transportation (Non-Medical): No  Physical Activity: Not on file  Stress: Not on file  Social Connections: Not on file  Intimate Partner Violence: Not on file    Family History  Problem Relation Age of Onset  . Stroke Mother   . Diabetes Mother   . Hypertension Mother   . Aneurysm Mother   . Diabetes Father   . Hypertension Father   . Diabetes Sister   . Hypertension Sister   . Diabetes Maternal Grandmother   . Diabetes Maternal Grandfather   . Cancer Paternal Grandfather        Prostate    Current Outpatient Medications  Medication Sig Dispense Refill  . Accu-Chek Softclix Lancets lancets Check blood sugar up  to 3 times  day 300 each 3  . acetaminophen (TYLENOL) 500 MG tablet Take 1,000 mg by mouth every 6 (six) hours as needed (for pain.).    Marland Kitchen albuterol (PROVENTIL HFA) 108 (90 Base) MCG/ACT inhaler INHALE 2 PUFFS BY MOUTH EVERY 4 HOURS AS NEEDED FOR COUGHING, WHEEZING, OR SHORTNESS OF BREATH 20.1 g 2  . amLODipine (NORVASC) 5 MG tablet TAKE 1 TABLET BY MOUTH EVERY NIGHT AS DIRECTED 90 tablet 3  . aspirin EC 81 MG tablet Take 1 tablet (81 mg total) by mouth daily. 90 tablet 3  . blood glucose meter kit and supplies Dispense based on patient and insurance preference. Use up  to four times daily as directed. (FOR ICD-10 E10.9, E11.9). 1 each 0  . Blood Glucose Monitoring Suppl (ACCU-CHEK GUIDE) w/Device KIT 1 each by Does not apply route 3 (three) times daily. 1 kit 0  . Blood Pressure Monitor MISC USE AS DIRECTED FOUR TIMES DAILY. (Patient taking differently: 4 (four) times daily. as directed) 1 each 0  . Cholecalciferol (VITAMIN D3) 50 MCG (2000 UT) CAPS Take 4 capsules (8,000 Units total) by mouth daily. 360 capsule 3  . Continuous Blood Gluc Receiver (DEXCOM G6 RECEIVER) DEVI Use to check blood sugar at least 6 times a day 1 each 0  . Continuous Blood Gluc Sensor (DEXCOM G6 SENSOR) MISC Use to check blood sugar at least 6 times a day 9 each 3  . Continuous Blood Gluc Transmit (DEXCOM G6 TRANSMITTER) MISC Use to check blood sugar at least 6 times a day 1 each 3  . diclofenac Sodium (VOLTAREN) 1 % GEL APPLY 4 G TOPICALLY 4 (FOUR) TIMES DAILY. 100 g 0  . gabapentin (NEURONTIN) 100 MG capsule TAKE 1 CAPSULE (100 MG TOTAL) BY MOUTH AT BEDTIME. 30 capsule 0  . glucose blood (ACCU-CHEK GUIDE) test strip Check blood sugar up to 3 times  day 300 each 3  . injection device for insulin (INPEN 100-PINK-LILLY-HUMALOG) DEVI Use to inject Humalog insulin for meals and correction insulin 1 each 1  . insulin degludec (TRESIBA) 100 UNIT/ML FlexTouch Pen Inject 24 Units into the skin daily. 15 mL 1  . insulin lispro  (HUMALOG) 100 UNIT/ML KwikPen INJECT 4 TO 9 UNITS INTO THE SKIN THREE TIMES DAILY BASED ON CARB COUNTS AND CORRECTION SCALE FOR A MAXIMUM OF 24 UNITS DAILY. 15 mL 11  . Insulin Pen Needle 32G X 4 MM MISC Use to inject insulin 4 times a day 400 each 3  . Insulin Syringe-Needle U-100 31G X 15/64" 0.3 ML MISC Use to inject insulin daily 100 each 3  . megestrol (MEGACE) 40 MG tablet TAKE 2 TABLETS (80 MG TOTAL) BY MOUTH 2 (TWO) TIMES DAILY. 120 tablet 8  . multivitamin (RENA-VIT) TABS tablet Take 1 tablet by mouth daily.  3  . naloxone (NARCAN) nasal spray 4 mg/0.1 mL Place 1 spray into the nose as needed, if found unresponsive then spray into nose and call 911. 2 each 12  . ondansetron (ZOFRAN) 4 MG tablet Take 1 tablet (4 mg total) by mouth every 8 (eight) hours as needed for nausea or vomiting. 20 tablet 0  . oxyCODONE-acetaminophen (PERCOCET) 10-325 MG tablet TAKE 1 TABLET BY MOUTH 4 TIMES A DAY AS NEEDED 60 tablet 0  . oxyCODONE-acetaminophen (PERCOCET) 10-325 MG tablet Take 1 tablet by mouth 4 (four) times daily as needed. 120 tablet 0  . PRESCRIPTION MEDICATION at bedtime.    Marland Kitchen RENAGEL 800 MG tablet Take 4,800 mg by mouth See admin instructions. Take 4800 mg tablets three times daily with meals  11  . sertraline (ZOLOFT) 100 MG tablet Take 0.5 tablets (50 mg total) by mouth daily. 15 tablet 2  . sevelamer carbonate (RENVELA) 800 MG tablet Take by mouth.    . metoprolol tartrate (LOPRESSOR) 25 MG tablet Take 1 tablet (25 mg total) by mouth 2 (two) times daily. Do not take the night before dialysis, and take 2 hours after dialysis. 180 tablet 3   No current facility-administered medications for this visit.    Allergies  Allergen Reactions  . Adhesive [Tape] Other (See Comments)    Irritation  REVIEW OF SYSTEMS:   '[X]'$  denotes positive finding, $RemoveBeforeDEI'[ ]'aRMzbArqCByhfbYb$  denotes negative finding Cardiac  Comments:  Chest pain or chest pressure:    Shortness of breath upon exertion:    Short of breath  when lying flat:    Irregular heart rhythm:        Vascular    Pain in calf, thigh, or hip brought on by ambulation:    Pain in feet at night that wakes you up from your sleep:     Blood clot in your veins:    Leg swelling:         Pulmonary    Oxygen at home:    Productive cough:     Wheezing:         Neurologic    Sudden weakness in arms or legs:     Sudden numbness in arms or legs:     Sudden onset of difficulty speaking or slurred speech:    Temporary loss of vision in one eye:     Problems with dizziness:         Gastrointestinal    Blood in stool:     Vomited blood:         Genitourinary    Burning when urinating:     Blood in urine:        Psychiatric    Major depression:         Hematologic    Bleeding problems:    Problems with blood clotting too easily:        Skin    Rashes or ulcers:        Constitutional    Fever or chills:      PHYSICAL EXAMINATION:  Vitals:   04/06/21 0913  BP: (!) 155/83  Pulse: 92  Resp: 20  Temp: 97.9 F (36.6 C)  TempSrc: Temporal  SpO2: 98%  Weight: 229 lb 12.8 oz (104.2 kg)  Height: $Remove'5\' 5"'eyeVhJR$  (1.651 m)    General:  WDWN in NAD; vital signs documented above Gait: Not observed HENT: WNL, normocephalic Pulmonary: normal non-labored breathing  Cardiac: regular HR Abdomen: soft, NT, no masses Skin: without rashes Vascular Exam/Pulses:  Right Left  Radial 2+ (normal) 2+ (normal)   Extremities: Mild aneurysmal enlargement at stick sites; still with some needle hole bleeding after bandage was removed during visit today; easily palpable thrill near the anastomosis however thrill is less prominent as you approach the axilla Musculoskeletal: no muscle wasting or atrophy  Neurologic: A&O X 3;  No focal weakness or paresthesias are detected Psychiatric:  The pt has Normal affect.   ASSESSMENT/PLAN:: 45 y.o. 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   -The patient is aware the risks include but are not limited to: bleeding, nerve damage, thrombosis, and need for additional procedures.   -The patient agrees to proceed with the procedure.    Dagoberto Ligas, PA-C [facility] [phone]  Clinic MD:   Carlis Abbott

## 2021-04-07 ENCOUNTER — Other Ambulatory Visit: Payer: Self-pay

## 2021-04-13 ENCOUNTER — Other Ambulatory Visit: Payer: Self-pay

## 2021-04-13 ENCOUNTER — Encounter (HOSPITAL_COMMUNITY): Payer: Self-pay | Admitting: Surgery

## 2021-04-13 ENCOUNTER — Ambulatory Visit (HOSPITAL_COMMUNITY)
Admission: RE | Admit: 2021-04-13 | Discharge: 2021-04-13 | Disposition: A | Discharge status: Home / Self Care | Payer: Medicare Other | Attending: Surgery | Admitting: Surgery

## 2021-04-13 ENCOUNTER — Encounter (HOSPITAL_COMMUNITY)
Admission: RE | Disposition: A | Discharge status: Home / Self Care | Payer: Self-pay | Source: Home / Self Care | Attending: Surgery

## 2021-04-13 DIAGNOSIS — T82510A Breakdown (mechanical) of surgically created arteriovenous fistula, initial encounter: Secondary | ICD-10-CM | POA: Diagnosis present

## 2021-04-13 DIAGNOSIS — I120 Hypertensive chronic kidney disease with stage 5 chronic kidney disease or end stage renal disease: Secondary | ICD-10-CM | POA: Diagnosis not present

## 2021-04-13 DIAGNOSIS — Z87891 Personal history of nicotine dependence: Secondary | ICD-10-CM | POA: Insufficient documentation

## 2021-04-13 DIAGNOSIS — Z79899 Other long term (current) drug therapy: Secondary | ICD-10-CM | POA: Diagnosis not present

## 2021-04-13 DIAGNOSIS — Z7982 Long term (current) use of aspirin: Secondary | ICD-10-CM | POA: Insufficient documentation

## 2021-04-13 DIAGNOSIS — Z794 Long term (current) use of insulin: Secondary | ICD-10-CM | POA: Insufficient documentation

## 2021-04-13 DIAGNOSIS — Y841 Kidney dialysis as the cause of abnormal reaction of the patient, or of later complication, without mention of misadventure at the time of the procedure: Secondary | ICD-10-CM | POA: Diagnosis not present

## 2021-04-13 DIAGNOSIS — N186 End stage renal disease: Secondary | ICD-10-CM | POA: Diagnosis not present

## 2021-04-13 DIAGNOSIS — E1122 Type 2 diabetes mellitus with diabetic chronic kidney disease: Secondary | ICD-10-CM | POA: Insufficient documentation

## 2021-04-13 DIAGNOSIS — T82838A Hemorrhage of vascular prosthetic devices, implants and grafts, initial encounter: Secondary | ICD-10-CM | POA: Diagnosis not present

## 2021-04-13 DIAGNOSIS — Z992 Dependence on renal dialysis: Secondary | ICD-10-CM | POA: Diagnosis not present

## 2021-04-13 DIAGNOSIS — Y832 Surgical operation with anastomosis, bypass or graft as the cause of abnormal reaction of the patient, or of later complication, without mention of misadventure at the time of the procedure: Secondary | ICD-10-CM | POA: Diagnosis not present

## 2021-04-13 HISTORY — PX: A/V FISTULAGRAM: CATH118298

## 2021-04-13 HISTORY — PX: PERIPHERAL VASCULAR BALLOON ANGIOPLASTY: CATH118281

## 2021-04-13 LAB — POCT I-STAT, CHEM 8
BUN: 41 mg/dL — ABNORMAL HIGH (ref 6–20)
Calcium, Ion: 1.17 mmol/L (ref 1.15–1.40)
Chloride: 97 mmol/L — ABNORMAL LOW (ref 98–111)
Creatinine, Ser: 12.2 mg/dL — ABNORMAL HIGH (ref 0.44–1.00)
Glucose, Bld: 160 mg/dL — ABNORMAL HIGH (ref 70–99)
HCT: 32 % — ABNORMAL LOW (ref 36.0–46.0)
Hemoglobin: 10.9 g/dL — ABNORMAL LOW (ref 12.0–15.0)
Potassium: 5 mmol/L (ref 3.5–5.1)
Sodium: 137 mmol/L (ref 135–145)
TCO2: 32 mmol/L (ref 22–32)

## 2021-04-13 SURGERY — A/V FISTULAGRAM
Anesthesia: LOCAL | Laterality: Right

## 2021-04-13 MED ORDER — MIDAZOLAM HCL 2 MG/2ML IJ SOLN
INTRAMUSCULAR | Status: AC
  Filled 2021-04-13: qty 2

## 2021-04-13 MED ORDER — SODIUM CHLORIDE 0.9% FLUSH: 3.0000 mL | INTRAVENOUS | Status: DC | PRN

## 2021-04-13 MED ORDER — SODIUM CHLORIDE 0.9% FLUSH: 3.0000 mL | Freq: Two times a day (BID) | INTRAVENOUS | Status: DC

## 2021-04-13 MED ORDER — HEPARIN (PORCINE) IN NACL 1000-0.9 UT/500ML-% IV SOLN
INTRAVENOUS | Status: DC | PRN
  Administered 2021-04-13: 500 mL

## 2021-04-13 MED ORDER — OXYCODONE-ACETAMINOPHEN 5-325 MG PO TABS
2.0000 | ORAL_TABLET | Freq: Once | ORAL | Status: AC
  Administered 2021-04-13: 2 via ORAL
  Filled 2021-04-13: qty 2

## 2021-04-13 MED ORDER — FENTANYL CITRATE (PF) 100 MCG/2ML IJ SOLN
INTRAMUSCULAR | Status: AC
  Filled 2021-04-13: qty 2

## 2021-04-13 MED ORDER — HYDRALAZINE HCL 20 MG/ML IJ SOLN
10.0000 mg | Freq: Once | INTRAMUSCULAR | Status: AC
  Administered 2021-04-13: 10 mg via INTRAVENOUS
  Filled 2021-04-13: qty 1

## 2021-04-13 MED ORDER — FENTANYL CITRATE (PF) 100 MCG/2ML IJ SOLN
INTRAMUSCULAR | Status: DC | PRN
  Administered 2021-04-13: 25 ug via INTRAVENOUS

## 2021-04-13 MED ORDER — MIDAZOLAM HCL 2 MG/2ML IJ SOLN
INTRAMUSCULAR | Status: DC | PRN
  Administered 2021-04-13: 1 mg via INTRAVENOUS

## 2021-04-13 MED ORDER — LIDOCAINE HCL (PF) 1 % IJ SOLN
INTRAMUSCULAR | Status: DC | PRN
  Administered 2021-04-13: 2 mL

## 2021-04-13 MED ORDER — LIDOCAINE HCL (PF) 1 % IJ SOLN
INTRAMUSCULAR | Status: AC
  Filled 2021-04-13: qty 30

## 2021-04-13 MED ORDER — SODIUM CHLORIDE 0.9 % IV SOLN: 250.0000 mL | INTRAVENOUS | Status: DC | PRN

## 2021-04-13 MED ORDER — HEPARIN (PORCINE) IN NACL 1000-0.9 UT/500ML-% IV SOLN
INTRAVENOUS | Status: AC
  Filled 2021-04-13: qty 500

## 2021-04-13 MED ORDER — IODIXANOL 320 MG/ML IV SOLN
INTRAVENOUS | Status: DC | PRN
  Administered 2021-04-13: 35 mL

## 2021-04-13 SURGICAL SUPPLY — 16 items
BAG SNAP BAND KOVER 36X36 (MISCELLANEOUS) ×3 IMPLANT
BALLN MUSTANG 7.0X40 75 (BALLOONS) ×3
BALLOON MUSTANG 7.0X40 75 (BALLOONS) ×2 IMPLANT
CATH ANGIO 5F BER2 65CM (CATHETERS) ×3 IMPLANT
COVER DOME SNAP 22 D (MISCELLANEOUS) ×3 IMPLANT
KIT ENCORE 26 ADVANTAGE (KITS) ×3 IMPLANT
KIT MICROPUNCTURE NIT STIFF (SHEATH) ×3 IMPLANT
PROTECTION STATION PRESSURIZED (MISCELLANEOUS) ×3
SHEATH PINNACLE R/O II 6F 4CM (SHEATH) ×3 IMPLANT
SHEATH PROBE COVER 6X72 (BAG) ×3 IMPLANT
STATION PROTECTION PRESSURIZED (MISCELLANEOUS) ×2 IMPLANT
STOPCOCK MORSE 400PSI 3WAY (MISCELLANEOUS) ×3 IMPLANT
TRAY PV CATH (CUSTOM PROCEDURE TRAY) ×3 IMPLANT
TUBING CIL FLEX 10 FLL-RA (TUBING) ×3 IMPLANT
WIRE BENTSON .035X145CM (WIRE) ×3 IMPLANT
WIRE TORQFLEX AUST .018X40CM (WIRE) ×3 IMPLANT

## 2021-04-13 NOTE — Discharge Instructions (Signed)
Dialysis Fistulogram  A dialysis fistulogram is an X-ray test to look inside the vascular access site. This is the area where blood is removed and returned to your body during dialysis. Fistulas and grafts provide easy and effective access for dialysis. However, sometimes problems can occur. The fistula or graft can become clogged or narrow. This can make dialysis less effective. You may need to have a fistulogram to check for these problems. Tell a health care provider about:  Any allergies you have, including any reactions you have had to contrast dye.  All medicines you are taking, including vitamins, herbs, eye drops, creams, and over-the-counter medicines.  Any problems you or family members have had with anesthetic medicines.  Any blood disorders you have.  Any surgeries you have had.  Any medical conditions you have.  Whether you are pregnant or may be pregnant.  Any warmth, pain, redness, or swelling in the limb that has the fistula or graft.  Any bleeding from your fistula or graft.  Any of the following in the area where the fistula or graft leads: ? Numbness. ? Tingling. ? A cold feeling. ? Bluish or pale white color. What are the risks? Generally, this is a safe procedure. However, problems may occur, including:  Infection.  Bleeding.  Allergic reactions to medicines or dyes.  Damage to other structures, such as nearby nerves or blood vessels.  A blood clot in the fistula or graft.  A blood clot that travels to your lungs (pulmonary embolism). What happens before the procedure? Medicines Ask your health care provider about:  Changing or stopping your regular medicines. This is especially important if you are taking diabetes medicines or blood thinners (anticoagulants).  Taking medicines such as aspirin and ibuprofen. These medicines can thin your blood. Do not take these medicines unless your health care provider tells you to take them.  Taking  over-the-counter medicines, vitamins, herbs, and supplements. General instructions  Follow instructions from your health care provider about eating and drinking restrictions.  Plan to have a responsible adult take you home from the hospital or clinic.  Plan to have a responsible adult care for you for the time you are told after you leave the hospital or clinic. This is important.  You may have a blood or urine sample taken. These will help your health care provider learn how well your kidneys and liver are working and how well your blood clots.  Ask your health care provider what steps will be taken to help prevent infection. What happens during the procedure?  An IV will be inserted into one of your veins.  You may be connected to monitors that track your heart rate, blood pressure, oxygen level, and pulse.  You will be given one or more of the following: ? A medicine to help you relax (sedative). ? A medicine to numb the area (local anesthetic) on the limb with your fistula or graft.  The area of your body where the catheter is to be inserted will be sterilized and covered with a surgical drape.  The health care provider will make a very small incision at the vascular access site.  A sheath or short tube will be inserted into the fistula or graft.  The catheter will be inserted through the sheath and advanced until it reaches the area of possible problems.  Dye, also called contrast material,will be injected into the catheter. An angiogram or X-rays will be taken to help identify the site of the blockage.  If a blockage or narrowing is found, your health care provider can do treatments during the procedure to clear the problem. Treatments to open a blocked or narrowed fistula or graft may include: ? Using a device to remove the clot from the fistula or graft. ? Using a balloon (angioplasty) to open or widen the blocked area. ? Placing a small mesh tube (stent) to keep the  fistula or graft open. ? Injecting a medicine to dissolve the clot (thrombolysis).  When the procedure is complete, the catheter will be removed and pressure will be applied to stop any bleeding.  In some cases, the catheter site will be closed with stitches (sutures) underneath the skin.  Your skin will be covered with a bandage (dressing) and the IV will be removed. The procedure may vary among health care providers and hospitals. What happens after the procedure?  Your blood pressure, heart rate, breathing rate, and blood oxygen level will be monitored until you leave the hospital or clinic. Your health care provider will also monitor your vascular access site.  You can expect to wait in a recovery area for several hours.  If you were given a sedative during the procedure, it can affect you for several hours. Do not drive or operate machinery until your health care provider says that it is safe. Summary  A dialysis fistulogram is an X-ray test to look inside the vascular access site. This is the area where blood is removed and returned to your body during dialysis.  Before the procedure, ask your health care provider if you should change or stop any medicines you are taking.  If a problem is found during a fistulogram, your health care provider can also do treatments to clear the blocked or narrowed areas. This information is not intended to replace advice given to you by your health care provider. Make sure you discuss any questions you have with your health care provider. Document Revised: 06/10/2020 Document Reviewed: 06/10/2020 Elsevier Patient Education  [place].

## 2021-04-13 NOTE — Op Note (Signed)
    Patient name: Jane [name] MRN: [id number] DOB: Mar 16, 1976 Sex: female  04/13/2021 Pre-operative Diagnosis: Prolonged bleeding with dialysis Post-operative diagnosis:  Same Surgeon:  Annamarie Major Procedure Performed:  1.  Ultrasound-guided access, right brachiocephalic fistula  2.  Fistulogram  3.  Venoplasty x2 (cephalic vein)  4.  Conscious sedation, 29 minutes   Indications: The patient is having prolonged bleeding from her cannulation site.  She comes in today for further evaluation  Procedure:  The patient was identified in the holding area and taken to room 8.  The patient was then placed supine on the table and prepped and draped in the usual sterile fashion.  A time out was called.  Conscious sedation was administered with the use of IV fentanyl and Versed under continuous physician and nurse monitoring.  Heart rate, blood pressure, and oxygen saturations were continuously monitored.  Total sedation time was 29 minutes ultrasound was used to evaluate the fistula.  The vein was patent and compressible.  A digital ultrasound image was acquired.  The fistula was then accessed under ultrasound guidance using a micropuncture needle.  An 018 wire was then asvanced without resistance and a micropuncture sheath was placed.  Contrast injections were then performed through the sheath.  Findings: The central venous system is widely patent.  There are 2 cephalic vein arch branches, one is likely collateral.  Does have moderate, 50 to 60% stenosis.  There is also a lesion in the mid fistula with approximately 40% stenosis.  2 aneurysms are visualized within the proximal fistula.  The arterial venous anastomosis has a area of dissection that does not appear to be flow-limiting   Intervention: After the above images were acquired the decision was made to proceed with intervention.  Over a Bentson wire, a 6 French sheath was placed.  I individually selected both cephalic vein arch branches and  perform venoplasty using a 7 x 60 Mustang balloon with minimal residual stenosis.  The mid vein stenosis was also treated with a 7 mm balloon with no residual stenosis.  Completion imaging showed brisk flow through the fistula.  The sheath was removed and cannulation site closed with a Monocryl stitch.  Impression:  #1  To cephalic vein arch branches with moderate 50 to 60% stenosis, both treated with a 7 x 60 balloon with minimal residual stenosis.    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 Annamarie Major, M.D., [facility] [place] Office: [phone] Pager:  [phone]

## 2021-04-13 NOTE — Interval H&P Note (Signed)
History and Physical Interval Note:  04/13/2021 7:28 AM  Jane [name]  has presented today for surgery, with the diagnosis of instage renal.  The various methods of treatment have been discussed with the patient and family. After consideration of risks, benefits and other options for treatment, the patient has consented to  Procedure(s): A/V FISTULAGRAM - Right Arm (N/A) as a surgical intervention.  The patient's history has been reviewed, patient examined, no change in status, stable for surgery.  I have reviewed the patient's chart and labs.  Questions were answered to the patient's satisfaction.     Annamarie Major

## 2021-04-13 NOTE — Progress Notes (Signed)
Pt returned from procedure with pain at fistula site.  Pt's BP was noted to be elevated with the BP up to 202/94.  Dr. Trula Slade was paged concerning pt's pain and BP.  Verbal orders given for pain medication and IV antihypertensives.  Pt given medications with improvement of pain and BP dropped to 146/71. Pt denies any other symptoms and was able to be discharged. Discharge instructions reviewed with pt's son over the phone. Pt taken out in a wheelchair without incident.

## 2021-04-14 ENCOUNTER — Telehealth: Payer: Self-pay

## 2021-04-14 ENCOUNTER — Other Ambulatory Visit (HOSPITAL_COMMUNITY): Payer: Self-pay

## 2021-04-14 NOTE — Telephone Encounter (Signed)
Patient called to report swelling and tenderness after right arm AV fistulogram yesterday. Says "she can feel it thumping so she knows it's working." Denies pain or numbness in her fingers, no temperature changes. She skipped HD today. Discussed with PA, patient to attend Friday HD, and call if she has issues at dialysis. In the meantime, advised patient to elevate the arm. Patient verbalizes understanding.

## 2021-04-16 ENCOUNTER — Other Ambulatory Visit (HOSPITAL_COMMUNITY): Payer: Self-pay

## 2021-04-19 ENCOUNTER — Ambulatory Visit (INDEPENDENT_AMBULATORY_CARE_PROVIDER_SITE_OTHER): Payer: Medicare Other | Admitting: Obstetrics & Gynecology

## 2021-04-19 ENCOUNTER — Encounter: Payer: Self-pay | Admitting: Obstetrics & Gynecology

## 2021-04-19 ENCOUNTER — Other Ambulatory Visit: Payer: Self-pay

## 2021-04-19 VITALS — BP 136/59 | HR 100 | Temp 98.7°F | Ht 65.0 in | Wt 222.0 lb

## 2021-04-19 DIAGNOSIS — N939 Abnormal uterine and vaginal bleeding, unspecified: Secondary | ICD-10-CM

## 2021-04-19 NOTE — Progress Notes (Signed)
History:  45 y.o. [id number] here today for f/u of AUB. Liletta  IUD was placed 12/29/2019.  Pt reports only occ bleeding/spotting since having the LnIUD placed. She remains on the Megace. She denies other sx.    The following portions of the patient's history were reviewed and updated as appropriate: allergies, current medications, past family history, past medical history, past social history, past surgical history and problem list.  Review of Systems:  Pertinent items are noted in HPI.    Objective:  Physical Exam Blood pressure (!) 136/59, pulse 100, temperature 98.7 F (37.1 C), temperature source Oral, height 5\' 5"  (1.651 m), weight 222 lb (100.7 kg).  CONSTITUTIONAL: Well-developed, well-nourished female in no acute distress.  HENT:  Normocephalic, atraumatic EYES: Conjunctivae and EOM are normal. No scleral icterus.  NECK: Normal range of motion SKIN: Skin is warm and dry. No rash noted. Not diaphoretic.No pallor. [place]: Alert and oriented to person, place, and time. Normal coordination.   Abd: Soft, nontender and nondistended Pelvic: Normal appearing external genitalia; normal appearing vaginal mucosa and cervix.  Normal discharge.  Small uterus, no other palpable masses, no uterine or adnexal tenderness  Labs and Imaging 04/15/2020 CLINICAL DATA:  Initial evaluation for abnormal uterine bleeding.  EXAM: ULTRASOUND PELVIS TRANSVAGINAL  TECHNIQUE: Transvaginal ultrasound examination of the pelvis was performed including evaluation of the uterus, ovaries, adnexal regions, and pelvic cul-de-sac.  COMPARISON:  Prior ultrasound from 11/29/2018.  FINDINGS: Uterus  Measurements: 8.9 x 5.2 x 4.8 cm = volume: 115 mL. Uterus demonstrates a heterogeneous echotexture without discrete fibroid or other mass.  Endometrium  Thickness: 15 mm. Endometrial complex demonstrates a mildly heterogeneous echotexture without focal abnormality. Small amount of mildly complex fluid  noted within the endometrial canal at the level of the lower uterine segment/cervix.  Right ovary  Measurements: 2.8 x 1.3 x 2.1 cm = volume: 3.8 mL. Normal appearance/no adnexal mass.  Left ovary  Measurements: 3.7 x 1.6 x 2.1 cm = volume: 6.6 mL. Normal appearance/no adnexal mass.  Other findings:  Trace free fluid noted within the pelvis.  IMPRESSION: 1. Endometrial stripe measures up to 15 mm in thickness. If bleeding remains unresponsive to hormonal or medical therapy, sonohysterogram should be considered for focal lesion work-up. (Ref: Radiological Reasoning: Algorithmic Workup of Abnormal Vaginal Bleeding with Endovaginal Sonography and Sonohysterography. AJR 2008; 671:I45-80). 2. Small amount of mildly complex fluid within the endometrial cavity, likely blood products given history of abnormal uterine bleeding. 3. Otherwise negative pelvic ultrasound.   Assessment & Plan:  AUB- improved with LnIUD. rec stopping the Megace. F/u 3 months or sooner prn   Ayo Guarino L. Harraway-Smith, M.D., [name]

## 2021-04-22 ENCOUNTER — Encounter: Payer: Self-pay | Admitting: Dietician

## 2021-04-26 ENCOUNTER — Other Ambulatory Visit (HOSPITAL_COMMUNITY): Payer: Self-pay

## 2021-04-26 MED ORDER — OXYCODONE-ACETAMINOPHEN 10-325 MG PO TABS
1.0000 | ORAL_TABLET | Freq: Four times a day (QID) | ORAL | 0 refills | Status: DC | PRN
  Filled 2021-04-26: qty 120, 30d supply, fill #0

## 2021-04-28 ENCOUNTER — Other Ambulatory Visit: Payer: Self-pay | Admitting: Student

## 2021-04-28 ENCOUNTER — Other Ambulatory Visit (HOSPITAL_COMMUNITY): Payer: Self-pay

## 2021-04-28 DIAGNOSIS — E113493 Type 2 diabetes mellitus with severe nonproliferative diabetic retinopathy without macular edema, bilateral: Secondary | ICD-10-CM

## 2021-04-28 MED ORDER — GABAPENTIN 100 MG PO CAPS
100.0000 mg | ORAL_CAPSULE | Freq: Every day | ORAL | 0 refills | Status: DC
  Filled 2021-04-28 – 2021-07-28 (×3): qty 30, 30d supply, fill #0

## 2021-05-04 ENCOUNTER — Telehealth: Payer: Self-pay | Admitting: *Deleted

## 2021-05-04 ENCOUNTER — Telehealth: Payer: Medicare Other

## 2021-05-04 NOTE — Telephone Encounter (Signed)
  Care Management   Outreach Note  05/04/2021 Name: Jane [name] MRN: [id number] DOB: 03/15/1976  Referred by: [name], MD Reason for referral : Care Coordination (CAD, ESRD with 32 oz fluid restrticiton per day, IDDM, HLD, obesity, depression, Covid + on 12/14/20 )   An unsuccessful telephone outreach was attempted today. The patient was referred to the case management team for assistance with care management and care coordination.   Follow Up Plan: A HIPAA compliant phone message was left for the patient providing contact information and requesting a return call.  The care management team will reach out to the patient again over the next 7-14 days.   Kelli Churn RN, CCM, [facility] RN Care Manager [phone]

## 2021-05-06 ENCOUNTER — Other Ambulatory Visit (HOSPITAL_COMMUNITY): Payer: Self-pay

## 2021-05-10 ENCOUNTER — Telehealth: Payer: Medicare Other

## 2021-05-18 ENCOUNTER — Encounter: Payer: Self-pay | Admitting: *Deleted

## 2021-05-24 ENCOUNTER — Ambulatory Visit: Payer: Medicare Other | Admitting: *Deleted

## 2021-05-24 DIAGNOSIS — I10 Essential (primary) hypertension: Secondary | ICD-10-CM

## 2021-05-24 DIAGNOSIS — Z992 Dependence on renal dialysis: Secondary | ICD-10-CM

## 2021-05-24 DIAGNOSIS — Z794 Long term (current) use of insulin: Secondary | ICD-10-CM

## 2021-05-24 DIAGNOSIS — N186 End stage renal disease: Secondary | ICD-10-CM

## 2021-05-24 DIAGNOSIS — E083413 Diabetes mellitus due to underlying condition with severe nonproliferative diabetic retinopathy with macular edema, bilateral: Secondary | ICD-10-CM

## 2021-05-24 NOTE — Patient Instructions (Signed)
Visit Information It was nice speaking with you today.   Goals Addressed             This Visit's Progress    Make and Keep All Appointments       Timeframe:  Long-Range Goal Priority:  High Start Date:         10/30/20                    Expected End Date:    ongoing                 Follow Up Date 07/13/21   - ask family or friend for a ride - call to cancel if needed - keep a calendar with appointment dates    Why is this important?   Part of staying healthy is seeing the doctor for follow-up care.  If you forget your appointments, there are some things you can do to stay on track.    Notes: meeting goal         The patient verbalized understanding of instructions, educational materials, and care plan provided today and declined offer to receive copy of patient instructions, educational materials, and care plan.   The care management team will reach out to the patient again over the next 30 days.   Kelli Churn RN, CCM, [facility] RN Care Manager [phone]

## 2021-05-24 NOTE — Chronic Care Management (AMB) (Signed)
Care Management    RN Visit Note  05/24/2021 Name: Jane [name] MRN: [id number] DOB: [birth year]  Subjective: Jane [name] is a 45 y.o. year old female who is a primary care patient of Jane Lennert, MD. The care management team was consulted for assistance with disease management and care coordination needs.    Engaged with patient by telephone for follow up visit in response to provider referral for case management and/or care coordination services.   Consent to Services:   Jane [name] was given information about Care Management services today including:  Care Management services includes personalized support from designated clinical staff supervised by her physician, including individualized plan of care and coordination with other care providers 24/7 contact phone numbers for assistance for urgent and routine care needs. The patient may stop case management services at any time by phone call to the office staff.  Patient agreed to services and consent obtained.   Assessment: Review of patient past medical history, allergies, medications, health status, including review of consultants reports, laboratory and other test data, was performed as part of comprehensive evaluation and provision of chronic care management services.   SDOH (Social Determinants of Health) assessments and interventions performed:    Care Plan  Allergies  Allergen Reactions   Adhesive [Tape] Other (See Comments)    Irritation     Outpatient Encounter Medications as of 05/24/2021  Medication Sig Note   Accu-Chek Softclix Lancets lancets Check blood sugar up to 3 times  day    acetaminophen (TYLENOL) 500 MG tablet Take 1,000 mg by mouth every 6 (six) hours as needed (for pain.).    albuterol (PROVENTIL HFA) 108 (90 Base) MCG/ACT inhaler INHALE 2 PUFFS BY MOUTH EVERY 4 HOURS AS NEEDED FOR COUGHING, WHEEZING, OR SHORTNESS OF BREATH (Patient taking differently: Inhale 2 puffs into the lungs  every 4 (four) hours as needed for wheezing or shortness of breath. INHALE 2 PUFFS BY MOUTH EVERY 4 HOURS AS NEEDED FOR COUGHING, WHEEZING, OR SHORTNESS OF BREATH)    amLODipine (NORVASC) 5 MG tablet TAKE 1 TABLET BY MOUTH EVERY NIGHT AS DIRECTED (Patient taking differently: Take 5 mg by mouth daily.)    blood glucose meter kit and supplies Dispense based on patient and insurance preference. Use up to four times daily as directed. (FOR ICD-10 E10.9, E11.9).    Blood Glucose Monitoring Suppl (ACCU-CHEK GUIDE) w/Device KIT 1 each by Does not apply route 3 (three) times daily.    Blood Pressure Monitor MISC USE AS DIRECTED FOUR TIMES DAILY. (Patient taking differently: 4 (four) times daily. as directed)    Cholecalciferol (VITAMIN D3) 50 MCG (2000 UT) CAPS Take 4 capsules (8,000 Units total) by mouth daily.    Continuous Blood Gluc Receiver (DEXCOM G6 RECEIVER) DEVI Use to check blood sugar at least 6 times a day    Continuous Blood Gluc Sensor (DEXCOM G6 SENSOR) MISC Use to check blood sugar at least 6 times a day    Continuous Blood Gluc Transmit (DEXCOM G6 TRANSMITTER) MISC Use to check blood sugar at least 6 times a day    diclofenac Sodium (VOLTAREN) 1 % GEL APPLY 4 G TOPICALLY 4 (FOUR) TIMES DAILY.    gabapentin (NEURONTIN) 100 MG capsule Take 1 capsule (100 mg total) by mouth at bedtime.    glucose blood (ACCU-CHEK GUIDE) test strip Check blood sugar up to 3 times  day    injection device for insulin (INPEN 100-PINK-LILLY-HUMALOG) DEVI Use to inject Humalog insulin for  meals and correction insulin    insulin degludec (TRESIBA) 100 UNIT/ML FlexTouch Pen Inject 24 Units into the skin daily.    insulin lispro (HUMALOG) 100 UNIT/ML KwikPen INJECT 4 TO 9 UNITS INTO THE SKIN THREE TIMES DAILY BASED ON CARB COUNTS AND CORRECTION SCALE FOR A MAXIMUM OF 24 UNITS DAILY. (Patient taking differently: Inject 6 Units into the skin 3 (three) times daily.)    Insulin Pen Needle 32G X 4 MM MISC Use to inject  insulin 4 times a day    Insulin Syringe-Needle U-100 31G X 15/64" 0.3 ML MISC Use to inject insulin daily    metoprolol tartrate (LOPRESSOR) 25 MG tablet Take 1 tablet (25 mg total) by mouth 2 (two) times daily. Do not take the night before dialysis, and take 2 hours after dialysis.    multivitamin (RENA-VIT) TABS tablet Take 1 tablet by mouth daily.    naloxone (NARCAN) nasal spray 4 mg/0.1 mL Place 1 spray into the nose as needed, if found unresponsive then spray into nose and call 911. 04/13/2021: Never taken     ondansetron (ZOFRAN) 4 MG tablet Take 1 tablet (4 mg total) by mouth every 8 (eight) hours as needed for nausea or vomiting.    oxyCODONE-acetaminophen (PERCOCET) 10-325 MG tablet Take 1 tablet by mouth 4 (four) times daily as needed.    RENAGEL 800 MG tablet Take 4,800 mg by mouth See admin instructions. Take 4800 mg tablets three times daily with meals    sertraline (ZOLOFT) 100 MG tablet Take 0.5 tablets (50 mg total) by mouth daily.    No facility-administered encounter medications on file as of 05/24/2021.    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 ([place]) 05/23/2018   Hidradenitis suppurativa 02/12/2018   Anemia in chronic kidney disease 02/12/2018   Secondary hyperparathyroidism of renal origin ([place]) 01/30/2018   Coagulation defect, unspecified ([place]) 01/18/2018   Major depressive disorder 08/11/2017   Sinus tachycardia 02/23/2017   ESRD (end stage renal disease) on dialysis ([place]) 01/05/2017   CAD S/P BMS PCI to prox LAD 03/31/2016   Abnormal uterine bleeding  (AUB) 03/17/2016   PAOD (peripheral arterial occlusive disease) ([place]) 01/08/2016   Charcot foot due to diabetes mellitus ([place]) 09/07/2015   Peripheral neuropathy 09/20/2014   Essential hypertension 08/16/2014   Seasonal allergic rhinitis 08/16/2014   Candidal vulvovaginitis 02/09/2014   Morbid obesity ([place]) 09/26/2012   Hyperlipidemia associated with type 2 diabetes mellitus ([place]) 02/26/2009   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, with long-term current use of insulin ([place]) 05/08/2007   Asthma 05/08/2007    Conditions to be addressed/monitored:  CAD, ESRD with 32 oz fluid restrticiton per day,started HD feb 2019, IDDM, HLD, obesity, depression, Covid + on 12/14/20   Care Plan : CCM RN Diabetes Type 2 (Adult)  Updates made by Barrington Ellison, RN since 05/24/2021 12:00 AM     Problem: Disease Progression (Diabetes, Type 2)      Goal: Disease Progression Prevented or Minimized   Start Date: 01/31/2020  Expected End Date: 05/13/2021  Recent Progress: On track  Priority: Low  Note:   CARE PLAN ENTRY (see longitudinal plan of care for additional care plan information)  Current Barriers:  Chronic Disease Management support, education, and care coordination needs related to CAD, HTN, HLD, DMII, and ESRD- patient states she is doing OK coping with the recent loss of her Mom and a good friend, state she is not wearing the Dexcom CGM because the sensor weren't working and she tried to replace them but the mail order company needed the serial numbers before they will send her replacement,s says she has not worn the CGM for > 1 month, she says she continues to attend a pain management clinic for he neck pain associated with scoliosis, is no longer attending OP rehab,  and continues to use oxycodone to treat her neck and knee pain, says she had a steroid injection in her knee that helped but it also increased her blood sugars.  Reports she is taking her medications as  prescribed and despite being instructed to skip her BP medication the day before dialysis she takes it and her BP at dialysis has been much better, she continues to meet with her counselor in a regular basis.  Clinical Goal(s) related to CAD, HTN, HLD, DMII, and ESRD:  Over the next 30-60 days, patient will:  Work with the care management team to address educational, disease management, and care coordination needs  Begin or continue self health monitoring activities as directed today Call provider office for new or worsened signs and symptoms New or worsened symptom related to DM, HTN, CAD, ESRD Call care management team with questions or concerns Verbalize basic understanding of patient centered plan of care established today  Interventions related to CAD, HTN, HLD, DMII, and ESRD:  Prior to interviewing patient, reviewed patient status, including review of recent office visit notes, consultants reports, relevant laboratory and other test results, and medications, completed. Provided patient with the opportunity to discuss the death of her mother and emotional support provided Assessed patient's self management skills in regards to HTN, IDDM, HLD, CAD and ESRD  Evaluation of current treatment plan related to HTN and IDDM, HLD and ESRD and patient's adherence to plan as established by provider. Reviewed medications with patient and assessed medication taking behavior Assessed patient's satisfaction with CGM Rx and reviewed episodes of hypoglycemia and hyperglycemia Reviewed how to treat hypoglycemia and encouraged patient to carry sources of quick sugar with her at all times especially when at dialysis center  Much positive reinforcement given to patient for taking charge of her health issues and using the tools and her knowledge to improve her health despite the deaths of close friends and family in the last year Advised  patient the role of the clinic CCM RN will transition form this  RN to  [employer] as of 05/28/21 Reviewed upcoming appointments including: cardiology appointment on 8/25  Patient Self Care Activities related to CAD, HTN, HLD, DMII, and ESRD:  Patient is unable to independently self-manage chronic health conditions      Plan: The care management team will reach out to the patient again over the next 30 days.  Kelli Churn RN, CCM, [facility] RN Care Manager [phone]

## 2021-05-26 ENCOUNTER — Other Ambulatory Visit (HOSPITAL_COMMUNITY): Payer: Self-pay

## 2021-05-26 ENCOUNTER — Encounter (HOSPITAL_COMMUNITY): Payer: Self-pay | Admitting: Emergency Medicine

## 2021-05-26 ENCOUNTER — Emergency Department (HOSPITAL_COMMUNITY): Payer: Medicare Other

## 2021-05-26 ENCOUNTER — Other Ambulatory Visit: Payer: Self-pay

## 2021-05-26 ENCOUNTER — Emergency Department (HOSPITAL_COMMUNITY)
Admission: EM | Admit: 2021-05-26 | Discharge: 2021-05-27 | Disposition: A | Discharge status: Home / Self Care | Payer: Medicare Other | Attending: Emergency Medicine | Admitting: Emergency Medicine

## 2021-05-26 DIAGNOSIS — I132 Hypertensive heart and chronic kidney disease with heart failure and with stage 5 chronic kidney disease, or end stage renal disease: Secondary | ICD-10-CM | POA: Insufficient documentation

## 2021-05-26 DIAGNOSIS — I2510 Atherosclerotic heart disease of native coronary artery without angina pectoris: Secondary | ICD-10-CM | POA: Diagnosis not present

## 2021-05-26 DIAGNOSIS — Z79899 Other long term (current) drug therapy: Secondary | ICD-10-CM | POA: Insufficient documentation

## 2021-05-26 DIAGNOSIS — Z794 Long term (current) use of insulin: Secondary | ICD-10-CM | POA: Diagnosis not present

## 2021-05-26 DIAGNOSIS — Z8616 Personal history of COVID-19: Secondary | ICD-10-CM | POA: Insufficient documentation

## 2021-05-26 DIAGNOSIS — J45909 Unspecified asthma, uncomplicated: Secondary | ICD-10-CM | POA: Insufficient documentation

## 2021-05-26 DIAGNOSIS — R112 Nausea with vomiting, unspecified: Secondary | ICD-10-CM

## 2021-05-26 DIAGNOSIS — Z992 Dependence on renal dialysis: Secondary | ICD-10-CM | POA: Insufficient documentation

## 2021-05-26 DIAGNOSIS — Z87891 Personal history of nicotine dependence: Secondary | ICD-10-CM | POA: Insufficient documentation

## 2021-05-26 DIAGNOSIS — Z20822 Contact with and (suspected) exposure to covid-19: Secondary | ICD-10-CM | POA: Insufficient documentation

## 2021-05-26 DIAGNOSIS — I5032 Chronic diastolic (congestive) heart failure: Secondary | ICD-10-CM | POA: Diagnosis not present

## 2021-05-26 DIAGNOSIS — N186 End stage renal disease: Secondary | ICD-10-CM | POA: Diagnosis not present

## 2021-05-26 LAB — MAGNESIUM: Magnesium: 2.4 mg/dL (ref 1.7–2.4)

## 2021-05-26 LAB — CBC WITH DIFFERENTIAL/PLATELET
Abs Immature Granulocytes: 0.02 10*3/uL (ref 0.00–0.07)
Basophils Absolute: 0 10*3/uL (ref 0.0–0.1)
Basophils Relative: 0 %
Eosinophils Absolute: 0 10*3/uL (ref 0.0–0.5)
Eosinophils Relative: 1 %
HCT: 34.7 % — ABNORMAL LOW (ref 36.0–46.0)
Hemoglobin: 10.7 g/dL — ABNORMAL LOW (ref 12.0–15.0)
Immature Granulocytes: 0 %
Lymphocytes Relative: 11 %
Lymphs Abs: 0.6 10*3/uL — ABNORMAL LOW (ref 0.7–4.0)
MCH: 27.6 pg (ref 26.0–34.0)
MCHC: 30.8 g/dL (ref 30.0–36.0)
MCV: 89.4 fL (ref 80.0–100.0)
Monocytes Absolute: 0.3 10*3/uL (ref 0.1–1.0)
Monocytes Relative: 5 %
Neutro Abs: 4.8 10*3/uL (ref 1.7–7.7)
Neutrophils Relative %: 83 %
Platelets: 241 10*3/uL (ref 150–400)
RBC: 3.88 MIL/uL (ref 3.87–5.11)
RDW: 16.1 % — ABNORMAL HIGH (ref 11.5–15.5)
WBC: 5.8 10*3/uL (ref 4.0–10.5)
nRBC: 0 % (ref 0.0–0.2)

## 2021-05-26 LAB — COMPREHENSIVE METABOLIC PANEL
ALT: 14 U/L (ref 0–44)
AST: 14 U/L — ABNORMAL LOW (ref 15–41)
Albumin: 3.9 g/dL (ref 3.5–5.0)
Alkaline Phosphatase: 138 U/L — ABNORMAL HIGH (ref 38–126)
Anion gap: 10 (ref 5–15)
BUN: 32 mg/dL — ABNORMAL HIGH (ref 6–20)
CO2: 31 mmol/L (ref 22–32)
Calcium: 8.6 mg/dL — ABNORMAL LOW (ref 8.9–10.3)
Chloride: 92 mmol/L — ABNORMAL LOW (ref 98–111)
Creatinine, Ser: 10.6 mg/dL — ABNORMAL HIGH (ref 0.44–1.00)
GFR, Estimated: 4 mL/min — ABNORMAL LOW (ref >60)
Glucose, Bld: 280 mg/dL — ABNORMAL HIGH (ref 70–99)
Potassium: 5 mmol/L (ref 3.5–5.1)
Sodium: 133 mmol/L — ABNORMAL LOW (ref 135–145)
Total Bilirubin: 1 mg/dL (ref 0.3–1.2)
Total Protein: 7.6 g/dL (ref 6.5–8.1)

## 2021-05-26 LAB — BRAIN NATRIURETIC PEPTIDE: B Natriuretic Peptide: 167.6 pg/mL — ABNORMAL HIGH (ref 0.0–100.0)

## 2021-05-26 LAB — LIPASE, BLOOD: Lipase: 37 U/L (ref 11–51)

## 2021-05-26 LAB — CBG MONITORING, ED: Glucose-Capillary: 253 mg/dL — ABNORMAL HIGH (ref 70–99)

## 2021-05-26 MED ORDER — OXYCODONE-ACETAMINOPHEN 10-325 MG PO TABS
1.0000 | ORAL_TABLET | Freq: Four times a day (QID) | ORAL | 0 refills | Status: DC | PRN
  Filled 2021-05-26: qty 120, 30d supply, fill #0

## 2021-05-26 NOTE — ED Provider Notes (Signed)
Emergency Medicine Provider Triage Evaluation Note  Jane [name] , a 45 y.o. female  was evaluated in triage.  Pt complains of presents with concerns of a low blood pressure, states that she checked her blood pressure today after dialysis treatment it was in the 60s.  She also notes that she is just not feeling well, states that she feels fatigued, has had consistent nausea and vomiting, has been unable to hold anything down today.  She has some stomach pain, is mainly in her epigastric region, does not radiate, has no significant surgical history.  She goes to dialysis Monday Wednesday Friday, she missed her treatment on Monday but got a full treatment today.  She denies recent sick contacts, is up-to-date on her COVID vaccines..  Review of Systems  Positive: Nausea, vomiting Negative: Chest pain, shortness of breath  Physical Exam  BP (!) 186/95 (BP Location: Left Arm)   Pulse 96   Temp 98.2 F (36.8 C) (Oral)   Resp 16   SpO2 100%  Gen:   Awake, no distress   Resp:  Normal effort  MSK:   Moves extremities without difficulty  Other:    Medical Decision Making  Medically screening exam initiated at 9:33 PM.  Appropriate orders placed.  Jane [name] was informed that the remainder of the evaluation will be completed by another provider, this initial triage assessment does not replace that evaluation, and the importance of remaining in the ED until their evaluation is complete.  Presents with low blood pressure, not feeling well, lab work, imaging, have been ordered patient will need further work-up.   Marcello Fennel, PA-C 05/26/21 [id number]    Varney Biles, MD 05/26/21 [id number]

## 2021-05-26 NOTE — ED Triage Notes (Signed)
Pt is a dialysis MWF, completed full treatment today. Pt reports after dialysis had an episode of hypotension. Ever since pt has had n/v and fatigue. Pt reports BP is high again. Also reports she lost a lot of blood when they took out the dialysis catheter.

## 2021-05-27 ENCOUNTER — Other Ambulatory Visit (HOSPITAL_COMMUNITY): Payer: Self-pay

## 2021-05-27 LAB — RESP PANEL BY RT-PCR (FLU A&B, COVID) ARPGX2
Influenza A by PCR: NEGATIVE
Influenza B by PCR: NEGATIVE
SARS Coronavirus 2 by RT PCR: NEGATIVE

## 2021-05-27 MED ORDER — METOCLOPRAMIDE HCL 10 MG PO TABS
10.0000 mg | ORAL_TABLET | Freq: Three times a day (TID) | ORAL | 0 refills | Status: DC | PRN
  Filled 2021-05-27: qty 10, 4d supply, fill #0

## 2021-05-27 MED ORDER — METOCLOPRAMIDE HCL 5 MG/ML IJ SOLN
10.0000 mg | Freq: Once | INTRAMUSCULAR | Status: AC
  Administered 2021-05-27: 10 mg via INTRAVENOUS
  Filled 2021-05-27: qty 2

## 2021-05-27 NOTE — ED Provider Notes (Signed)
[facility] EMERGENCY DEPARTMENT Provider Note   CSN: [id number] Arrival date & time: 05/26/21  2103     History Chief Complaint  Patient presents with   Fatigue   Blood Pressure problem    Jane [name] is a 45 y.o. female.  HPI Patient presents feeling bad.  Has had some fatigue.  States that she did dialysis today.  She had missed Monday because she was feeling a little bad.  On Monday she was just feeling little off.  No nausea or vomiting.  No fevers or chills.  States today she was a fair amount above weight.  They were able to get most of it off but 3 instead of 4 kg.  States after began to feel dizzy.  Was lightheaded.  Since then has had nausea and vomiting.  States blood pressure now improved but still feeling nauseous.  No chest pain.  No abdominal pain.  States she does sort of feel bad all over however.  No known sick contacts.  He is diabetic.  No reported history of gastroparesis.  Patient took Zofran and still is vomiting.    Past Medical History:  Diagnosis Date   Abnormal uterine bleeding (AUB)    Asthma    prn inhaler   CAD S/P BMS PCI to prox LAD cardiologist--- dr Beverlyn Roux LAD to Mid LAD lesion, 90% stenosed. Post intervention - Vision BMS 3.0 mm x 18 mm (~3.5 mm) there is a 0% residual stenosis. ;   nuclear stress test-- 09-13-2018 low risk with no ischemia, nuclear ef 56%   Charcot foot due to diabetes mellitus ([place]) 11/2016   left 2018; right 2019   Depression    Diabetic neuropathy (HCC)    feet   Diabetic retinopathy, nonproliferative, severe ([place])    bilateral   ESRD (end stage renal disease) on dialysis [facility])    ?chronic interstitial nephritis  ([facility]   H/O non-ST elevation myocardial infarction (NSTEMI) 03/2016   Found in 90% mid LAD lesion treated with bare-metal stent (BMS) PCI - vision BMS 3.0 mm x 18 mm   History of MRSA infection 2007   Hyperlipidemia    Hypertension    IDA (iron deficiency  anemia)    Insulin dependent type 2 diabetes mellitus (HCC)    Iron deficiency anemia    takes iron supplement   PAOD (peripheral arterial occlusive disease) ([place])    vascular--- dr Bridgett Larsson   S/P arteriovenous (AV) fistula creation 07/2017   S/p bare metal coronary artery stent 03/31/2016   BMS x1  to pLAD   Sickle cell trait ([place])     Patient Active Problem List   Diagnosis Date Noted   Grief 04/01/2021   Diabetic neuropathy ([place]) 01/29/2021   Neck pain 01/07/2021   Chronic diastolic heart failure ([place]) 01/06/2021   Acute COVID-19 12/16/2020   Left lateral abdominal pain 02/02/2020   Ulcer of left foot due to type 2 diabetes mellitus ([place]) 06/03/2019   Diabetic foot ulcer ([place]) 04/22/2019   Arthritis of left knee 02/11/2019   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 ([place]) 05/23/2018   Hidradenitis suppurativa 02/12/2018   Anemia in chronic kidney disease 02/12/2018   Secondary hyperparathyroidism of renal origin ([place]) 01/30/2018   Coagulation defect, unspecified ([place]) 01/18/2018   Major depressive disorder 08/11/2017   Sinus tachycardia 02/23/2017   ESRD (end stage renal disease) on dialysis ([place]) 01/05/2017   CAD S/P BMS PCI to prox LAD  03/31/2016   Abnormal uterine bleeding (AUB) 03/17/2016   PAOD (peripheral arterial occlusive disease) ([place]) 01/08/2016   Charcot foot due to diabetes mellitus ([place]) 09/07/2015   Peripheral neuropathy 09/20/2014   Essential hypertension 08/16/2014   Seasonal allergic rhinitis 08/16/2014   Candidal vulvovaginitis 02/09/2014   Morbid obesity ([place]) 09/26/2012   Hyperlipidemia associated with type 2 diabetes mellitus ([place]) 02/26/2009   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, with long-term current use of insulin ([place]) 05/08/2007   Asthma 05/08/2007    Past Surgical History:  Procedure Laterality Date   A/V FISTULAGRAM N/A 04/13/2021   Procedure: A/V FISTULAGRAM - Right Arm;  Surgeon: Serafina Mitchell, MD;  Location: [place] [facility];  Service: Cardiovascular;  Laterality: N/A;   ACHILLES TENDON LENGTHENING Left 11/24/2016   Procedure: Left Achilles tendon lengthening (open);  Surgeon: Wylene Simmer, MD;  Location: [place];  Service: Orthopedics;  Laterality: Left;   AV FISTULA PLACEMENT Right 07/31/2017   Procedure: ARTERIOVENOUS BRACHIOCEPHALIC FISTULA CREATION RIGHT ARM;  Surgeon: Conrad [place], MD;  Location: [facility] OR;  Service: Vascular;  Laterality: Right;   CALCANEAL OSTEOTOMY Left 11/24/2016   Procedure: Left hindfoot osteotomy and fusion;  Surgeon: Wylene Simmer, MD;  Location: [place];  Service: Orthopedics;  Laterality: Left;   CARDIAC CATHETERIZATION N/A 03/31/2016   Procedure: Left Heart Cath and Coronary Angiography;  Surgeon: Lorretta Harp, MD;  Location: [place] [facility]: 90% early mLAD, normal LV Fxn   CARDIAC CATHETERIZATION N/A 03/31/2016   Procedure: Coronary Stent Intervention;  Surgeon: Lorretta Harp, MD;  Location: [place] [facility];  Service: Cardiovascular: PCI to mLAD BMS Vision 3.0 mm x 18 mm   CESAREAN [place] N/A 09/15/2020   Procedure: DILATATION & CURETTAGE/HYSTEROSCOPY;  Surgeon: Lavonia Drafts, MD;  Location: [place];  Service: Gynecology;  Laterality: N/A;   FISTULA SUPERFICIALIZATION Right 11/15/2017   Procedure: FISTULA SUPERFICIALIZATION RIGHT BRACHIOCEPHALIC  RIGHT;  Surgeon: Conrad [place], MD;  Location: [place];  Service: Vascular;  Laterality: Right;   FOOT SURGERY     multiple for [place]   PERIPHERAL VASCULAR BALLOON ANGIOPLASTY Right 04/13/2021   Procedure: PERIPHERAL VASCULAR BALLOON ANGIOPLASTY;  Surgeon: Serafina Mitchell, MD;  Location: [place] [facility];  Service: Cardiovascular;  Laterality: Right;  arm fistula   TUBAL LIGATION  1997     OB History     Gravida  2   Para  2   Term  1   Preterm  1   AB   0   Living  2      SAB  0   IAB  0   Ectopic  0   Multiple  0   Live Births              Family History  Problem Relation Age of Onset   Stroke Mother    Diabetes Mother    Hypertension Mother    Aneurysm Mother    Diabetes Father    Hypertension Father    Diabetes Sister    Hypertension Sister    Diabetes Maternal Grandmother    Diabetes Maternal Grandfather    Cancer Paternal Grandfather        Prostate    Social History   Tobacco Use   Smoking status: Former    Packs/day: 0.00    Years: 0.50    Pack years:  0.00    Types: Cigarettes    Quit date: 08/29/2015    Years since quitting: 5.7   Smokeless tobacco: Never  Vaping Use   Vaping Use: Never used  Substance Use Topics   Alcohol use: Yes    Alcohol/week: 0.0 standard drinks    Comment: occasional   Drug use: Yes    Types: Marijuana    Comment: 09-08-2020 per pt seldom smokes    Home Medications Prior to Admission medications   Medication Sig Start Date End Date Taking? Authorizing Provider  Accu-Chek Softclix Lancets lancets Check blood sugar up to 3 times  day 02/14/21  Yes Theotis Barrio, MD  acetaminophen (TYLENOL) 500 MG tablet Take 1,000 mg by mouth every 6 (six) hours as needed (for pain.).   Yes [provider]  albuterol (PROVENTIL HFA) 108 (90 Base) MCG/ACT inhaler INHALE 2 PUFFS BY MOUTH EVERY 4 HOURS AS NEEDED FOR COUGHING, WHEEZING, OR SHORTNESS OF BREATH Patient taking differently: Inhale 2 puffs into the lungs every 4 (four) hours as needed for wheezing or shortness of breath. INHALE 2 PUFFS BY MOUTH EVERY 4 HOURS AS NEEDED FOR COUGHING, WHEEZING, OR SHORTNESS OF BREATH 01/29/20  Yes Verdene Lennert, MD  amLODipine (NORVASC) 5 MG tablet TAKE 1 TABLET BY MOUTH EVERY NIGHT AS DIRECTED Patient taking differently: Take 5 mg by mouth daily. 08/18/20 08/18/21 Yes Maxie Barb, MD  blood glucose meter kit and supplies Dispense based on patient and insurance preference. Use up  to four times daily as directed. (FOR ICD-10 E10.9, E11.9). 01/29/21  Yes Evlyn Kanner, MD  Blood Glucose Monitoring Suppl (ACCU-CHEK GUIDE) w/Device KIT 1 each by Does not apply route 3 (three) times daily. 05/18/17  Yes Nyra Market, MD  Blood Pressure Monitor MISC USE AS DIRECTED FOUR TIMES DAILY. 01/29/21 01/29/22 Yes Evlyn Kanner, MD  Cholecalciferol (VITAMIN D3) 50 MCG (2000 UT) CAPS Take 4 capsules (8,000 Units total) by mouth daily. 02/24/21  Yes   Continuous Blood Gluc Receiver (DEXCOM G6 RECEIVER) DEVI Use to check blood sugar at least 6 times a day 02/25/21  Yes Verdene Lennert, MD  Continuous Blood Gluc Sensor (DEXCOM G6 SENSOR) MISC Use to check blood sugar at least 6 times a day 02/25/21  Yes Verdene Lennert, MD  Continuous Blood Gluc Transmit (DEXCOM G6 TRANSMITTER) MISC Use to check blood sugar at least 6 times a day 02/25/21  Yes Verdene Lennert, MD  diclofenac Sodium (VOLTAREN) 1 % GEL APPLY 4 G TOPICALLY 4 (FOUR) TIMES DAILY. 01/07/21 01/07/22 Yes Albertha Ghee, MD  gabapentin (NEURONTIN) 100 MG capsule Take 1 capsule (100 mg total) by mouth at bedtime. 04/28/21  Yes Rehman, Areeg N, DO  glucose blood (ACCU-CHEK GUIDE) test strip Check blood sugar up to 3 times  day 02/14/21  Yes Theotis Barrio, MD  injection device for insulin (INPEN 100-PINK-LILLY-HUMALOG) DEVI Use to inject Humalog insulin for meals and correction insulin 03/17/21  Yes Albertha Ghee, MD  insulin degludec (TRESIBA) 100 UNIT/ML FlexTouch Pen Inject 24 Units into the skin daily. 02/14/21  Yes Theotis Barrio, MD  insulin lispro (HUMALOG) 100 UNIT/ML KwikPen INJECT 4 TO 9 UNITS INTO THE SKIN THREE TIMES DAILY BASED ON CARB COUNTS AND CORRECTION SCALE FOR A MAXIMUM OF 24 UNITS DAILY. Patient taking differently: Inject 6 Units into the skin 3 (three) times daily. 08/20/20 08/20/21 Yes Theotis Barrio, MD  Insulin Pen Needle 32G X 4 MM MISC Use to inject insulin 4 times a day 02/14/21  Yes Theotis Barrio, MD  Insulin Syringe-Needle  U-100 31G X 15/64" 0.3 ML MISC Use to inject insulin daily 08/17/18  Yes Earl Lagos, MD  metoCLOPramide (REGLAN) 10 MG tablet Take 1 tablet (10 mg total) by mouth every 8 (eight) hours as needed for nausea. 05/27/21  Yes Benjiman Core, MD  metoprolol tartrate (LOPRESSOR) 25 MG tablet Take 1 tablet (25 mg total) by mouth 2 (two) times daily. Do not take the night before dialysis, and take 2 hours after dialysis. 04/06/21  Yes Marykay Lex, MD  multivitamin (RENA-VIT) TABS tablet Take 1 tablet by mouth daily. 04/30/18  Yes [provider]  naloxone (NARCAN) nasal spray 4 mg/0.1 mL Place 1 spray into the nose as needed, if found unresponsive then spray into nose and call 911. 02/24/21  Yes   ondansetron (ZOFRAN) 4 MG tablet Take 1 tablet (4 mg total) by mouth every 8 (eight) hours as needed for nausea or vomiting. 04/01/21  Yes Verdene Lennert, MD  oxyCODONE-acetaminophen (PERCOCET) 10-325 MG tablet Take 1 tablet by mouth 4 (four) times daily as needed. Patient taking differently: Take 1 tablet by mouth 4 (four) times daily as needed for pain. 05/26/21  Yes   sertraline (ZOLOFT) 100 MG tablet Take 0.5 tablets (50 mg total) by mouth daily. 04/01/21  Yes Verdene Lennert, MD  sevelamer carbonate (RENVELA) 800 MG tablet Take 3,200 mg by mouth 3 (three) times daily with meals. Take 800 mg with snacks as needed   Yes [provider]    Allergies    Adhesive [tape]  Review of Systems   Review of Systems  Constitutional:  Negative for appetite change, fatigue and fever.  HENT:  Negative for congestion.   Respiratory:  Negative for shortness of breath.   Cardiovascular:  Negative for chest pain.  Gastrointestinal:  Positive for nausea and vomiting.  Genitourinary:  Negative for enuresis.  Skin:  Negative for rash.  Neurological:  Positive for light-headedness.  Psychiatric/Behavioral:  Negative for confusion.    Physical Exam Updated Vital Signs BP (!) 108/53   Pulse 94    Temp 98.2 F (36.8 C) (Oral)   Resp 18   Ht 5\' 5"  (1.651 m)   Wt 98.4 kg   SpO2 96%   BMI 36.11 kg/m   Physical Exam Vitals and nursing note reviewed.  HENT:     Head: Normocephalic.  Eyes:     Pupils: Pupils are equal, round, and reactive to light.  Cardiovascular:     Rate and Rhythm: Normal rate.  Pulmonary:     Effort: Pulmonary effort is normal.  Abdominal:     Tenderness: There is no abdominal tenderness.  Musculoskeletal:        General: No tenderness.     Cervical back: Neck supple.  Skin:    General: Skin is warm.  Neurological:     Mental Status: She is alert and oriented to person, place, and time.    ED Results / Procedures / Treatments   Labs (all labs ordered are listed, but only abnormal results are displayed) Labs Reviewed  COMPREHENSIVE METABOLIC PANEL - Abnormal; Notable for the following components:      Result Value   Sodium 133 (*)    Chloride 92 (*)    Glucose, Bld 280 (*)    BUN 32 (*)    Creatinine, Ser 10.60 (*)    Calcium 8.6 (*)    AST 14 (*)    Alkaline Phosphatase 138 (*)  GFR, Estimated 4 (*)    All other components within normal limits  CBC WITH DIFFERENTIAL/PLATELET - Abnormal; Notable for the following components:   Hemoglobin 10.7 (*)    HCT 34.7 (*)    RDW 16.1 (*)    Lymphs Abs 0.6 (*)    All other components within normal limits  BRAIN NATRIURETIC PEPTIDE - Abnormal; Notable for the following components:   B Natriuretic Peptide 167.6 (*)    All other components within normal limits  CBG MONITORING, ED - Abnormal; Notable for the following components:   Glucose-Capillary 253 (*)    All other components within normal limits  RESP PANEL BY RT-PCR (FLU A&B, COVID) ARPGX2  LIPASE, BLOOD  MAGNESIUM    EKG EKG Interpretation  Date/Time:  Wednesday May 26 2021 21:33:09 EDT Ventricular Rate:  99 PR Interval:  164 QRS Duration: 76 QT Interval:  372 QTC Calculation: 477 R Axis:   102 Text Interpretation: Normal  sinus rhythm Rightward axis Anterior infarct , age undetermined Abnormal ECG Confirmed by Davonna Belling [phone]) on 05/27/2021 3:01:58 AM  Radiology DG Chest 2 View  Result Date: 05/26/2021 CLINICAL DATA:  Hypotensive episode during dialysis. EXAM: CHEST - 2 VIEW COMPARISON:  09/11/2018 FINDINGS: The cardiac silhouette, mediastinal and hilar contours are normal. The lungs are clear. No pulmonary edema or pleural effusions. No pulmonary infiltrates. No pulmonary lesions. The bony thorax is intact. IMPRESSION: No acute cardiopulmonary findings. Electronically Signed   By: Marijo Sanes M.D.   On: 05/26/2021 22:13    Procedures Procedures   Medications Ordered in ED Medications  metoCLOPramide (REGLAN) injection 10 mg (10 mg Intravenous Given 05/27/21 0351)    ED Course  I have reviewed the triage vital signs and the nursing notes.  Pertinent labs & imaging results that were available during my care of the patient were reviewed by me and considered in my medical decision making (see chart for details).    MDM Rules/Calculators/A&P                          Patient reportedly became hypotensive after finishing up dialysis.  She had missed a day and they had to take off extra fluid.  However developed nausea at the time and that remained.  States she had some episodes of this in the past.  Lab work overall reassuring.  However continued nauseous after Zofran.  Given Reglan and patient feels better.  With her diabetes there is potential that there could be a component of gastroparesis with this.  Patient appears stable for outpatient follow-up.  Tolerating orals without further vomiting.  Discharge home. Final Clinical Impression(s) / ED Diagnoses Final diagnoses:  Non-intractable vomiting with nausea, unspecified vomiting type  End stage renal disease on dialysis [facility])    Rx / DC Orders ED Discharge Orders          Ordered    metoCLOPramide (REGLAN) 10 MG tablet  Every 8 hours PRN         05/27/21 0631             Davonna Belling, MD 05/27/21 [phone]

## 2021-05-27 NOTE — ED Notes (Signed)
Pt cousin Tammy's phone number is [phone]

## 2021-05-27 NOTE — ED Notes (Signed)
Pt given crackers and ginger ale.  States she is feeling better at this time

## 2021-06-03 ENCOUNTER — Encounter: Payer: Self-pay | Admitting: Internal Medicine

## 2021-06-03 ENCOUNTER — Ambulatory Visit (INDEPENDENT_AMBULATORY_CARE_PROVIDER_SITE_OTHER): Payer: Medicare Other | Admitting: Internal Medicine

## 2021-06-03 ENCOUNTER — Other Ambulatory Visit: Payer: Self-pay

## 2021-06-03 ENCOUNTER — Other Ambulatory Visit (HOSPITAL_COMMUNITY): Payer: Self-pay

## 2021-06-03 ENCOUNTER — Telehealth: Payer: Self-pay | Admitting: Dietician

## 2021-06-03 VITALS — BP 115/66 | HR 89 | Temp 99.1°F | Ht 65.0 in | Wt 222.4 lb

## 2021-06-03 DIAGNOSIS — E113493 Type 2 diabetes mellitus with severe nonproliferative diabetic retinopathy without macular edema, bilateral: Secondary | ICD-10-CM

## 2021-06-03 DIAGNOSIS — R112 Nausea with vomiting, unspecified: Secondary | ICD-10-CM | POA: Insufficient documentation

## 2021-06-03 DIAGNOSIS — Z794 Long term (current) use of insulin: Secondary | ICD-10-CM

## 2021-06-03 DIAGNOSIS — E113413 Type 2 diabetes mellitus with severe nonproliferative diabetic retinopathy with macular edema, bilateral: Secondary | ICD-10-CM | POA: Diagnosis not present

## 2021-06-03 DIAGNOSIS — K589 Irritable bowel syndrome without diarrhea: Secondary | ICD-10-CM | POA: Insufficient documentation

## 2021-06-03 MED ORDER — INSULIN DEGLUDEC 100 UNIT/ML ~~LOC~~ SOPN
24.0000 [IU] | PEN_INJECTOR | Freq: Every day | SUBCUTANEOUS | 1 refills | Status: DC
  Filled 2021-06-03 – 2021-10-27 (×2): qty 15, 62d supply, fill #0
  Filled 2021-11-11 (×2): qty 15, 62d supply, fill #1

## 2021-06-03 NOTE — Telephone Encounter (Signed)
Appointment made for Tuesday to help her enter the therapy settings in her Inpen and learn how to use it.

## 2021-06-03 NOTE — Patient Instructions (Addendum)
It was nice seeing you today! Thank you for choosing [facility] for your Primary Care.    Today we talked about:   Nausea/Vomiting: I have ordered the gastric emptying study to check for Gastroparesis. For now, I recommend small meals.  A medication that may be contributing to this is Percocet. It does slow down your stomach. If possible, try to minimize the dose prior to the imaging study.   Diabetes: I will ask [name] to call you regarding the CGM and In-Pen  Weight Loss: Continue working on diet changes. Given that Trulicity can cause nausea and vomiting in the beginning, let's hold off until we work up the Gastroparesis.

## 2021-06-03 NOTE — Assessment & Plan Note (Addendum)
[name] states that she has episodes of intractable nausea and vomiting approximately 3-4 times per year.  Her most recent episode was not too long ago when she went to the ER for evaluation.  At that time the ER provider mention that she may have gastroparesis.  She would like further evaluation for this.  [name] states that she has Jane [name] experiencing early satiety compared to what she used to be able to eat.  Additionally, she can go 8 or 9 hours without feeling hungry and this is unusual for her.  She notes that her emesis is nonbloody.  During these episodes of nausea/vomiting, she also experiences epigastric abdominal pain.  Assessment/plan: [name] is certainly at risk of developing gastroparesis secondary to longstanding uncontrolled type 2 diabetes.  We will go ahead and evaluate with a gastric emptying study.  I counseled patient to hold both Reglan and Zofran prior to procedure for at least 2 weeks.  We also discussed that her Percocet would ideally be held for at least 2 days before the study and she expressed understanding; she will do the best she can.  - Gastric emptying study ordered - Will start Reglan 5 mg 3 times daily as needed if study is positive.  If negative, will need further evaluation - Recommended small meals more frequently rather than large meals more infrequently

## 2021-06-03 NOTE — Assessment & Plan Note (Signed)
Jane [name] states that she has not been taking any insulin products now for 2 weeks.  She ran out of cartridges for her In-pen.  She also was unable to apply her CGM due to an error message.  Patient states that she is coming out of the grieving.  Since the loss of her mother, and she is motivated to work on better controlling her diabetes.  Her ideal goal is to eventually not require insulin and understands that she would need significant weight loss in order to achieve this.  Assessment/plan: Uncontrolled type 2 diabetes.  We do not have any CBGs at this time to better understand her current glycemic levels.  I have asked patient to touch base with [name], our diabetic educator, in regards to getting CGM and in pen set up again.  - Refill Tyler Aas, continue with current dose of 24 units at bedtime - Patient will discuss with [name] on restarting short acting insulin via in pen and CGM monitoring

## 2021-06-03 NOTE — Progress Notes (Signed)
   CC: Nausea/Vomiting, T2DM  HPI:  [name] is a 45 y.o. with a PMHx as listed below who presents to the clinic for Nausea/Vomiting, T2DM.   Please see the Encounters tab for problem-based Assessment & Plan regarding status of patient's acute and chronic conditions.  Past Medical History:  Diagnosis Date   Abnormal uterine bleeding (AUB)    Asthma    prn inhaler   CAD S/P BMS PCI to prox LAD cardiologist--- dr Beverlyn Roux LAD to Mid LAD lesion, 90% stenosed. Post intervention - Vision BMS 3.0 mm x 18 mm (~3.5 mm) there is a 0% residual stenosis. ;   nuclear stress test-- 09-13-2018 low risk with no ischemia, nuclear ef 56%   Charcot foot due to diabetes mellitus ([place]) 11/2016   left 2018; right 2019   Depression    Diabetic neuropathy (HCC)    feet   Diabetic retinopathy, nonproliferative, severe ([place])    bilateral   ESRD (end stage renal disease) on dialysis [facility])    ?chronic interstitial nephritis  ([facility]   H/O non-ST elevation myocardial infarction (NSTEMI) 03/2016   Found in 90% mid LAD lesion treated with bare-metal stent (BMS) PCI - vision BMS 3.0 mm x 18 mm   History of MRSA infection 2007   Hyperlipidemia    Hypertension    IDA (iron deficiency anemia)    Insulin dependent type 2 diabetes mellitus (HCC)    Iron deficiency anemia    takes iron supplement   PAOD (peripheral arterial occlusive disease) ([place])    vascular--- dr Bridgett Larsson   S/P arteriovenous (AV) fistula creation 07/2017   S/p bare metal coronary artery stent 03/31/2016   BMS x1  to pLAD   Sickle cell trait ([place])    Review of Systems: Review of Systems  Constitutional:  Negative for chills, fever and weight loss.  Gastrointestinal:  Positive for abdominal pain, nausea and vomiting. Negative for blood in stool, constipation, diarrhea, heartburn and melena.   Physical Exam:  Vitals:   06/03/21 1357  BP: 115/66  Pulse: 89  Temp: 99.1 F (37.3 C)  TempSrc: Oral   SpO2: 100%  Weight: 222 lb 6.4 oz (100.9 kg)  Height: 5\' 5"  (1.651 m)   Physical Exam Vitals and nursing note reviewed.  Constitutional:      General: She is not in acute distress.    Appearance: She is obese.  HENT:     Head: Normocephalic and atraumatic.  Pulmonary:     Effort: Pulmonary effort is normal. No respiratory distress.  Abdominal:     General: There is no distension.     Palpations: Abdomen is soft. There is no mass.     Tenderness: There is abdominal tenderness (Epigastric). There is no guarding.  Skin:    General: Skin is warm and dry.  Neurological:     General: No focal deficit present.     Mental Status: She is alert and oriented to person, place, and time.  Psychiatric:        Mood and Affect: Mood normal.        Behavior: Behavior normal.        Thought Content: Thought content normal.    Assessment & Plan:   See Encounters Tab for problem based charting.  Patient discussed with Dr. Philipp Ovens

## 2021-06-03 NOTE — Assessment & Plan Note (Addendum)
Jane [name] is interested in weight loss and has started making some dietary adjustments including cutting out soda.  She notes that her friend lost significant amounts of weight with Trulicity and is wondering if she may use the same medication.    Assessment/plan: Per up-to-date, there is not a lot of data regarding GLP-1 agonist use in ESRD patients, but there is no contraindication either.  However, patient is experiencing nausea/vomiting at this time of unknown etiology.  Until we have a better understanding of that, I would hold off on starting a GLP-1 agonist given it could exacerbate her nausea/vomiting.  - Encouraged continued lifestyle modification

## 2021-06-04 ENCOUNTER — Telehealth: Payer: Self-pay

## 2021-06-04 ENCOUNTER — Other Ambulatory Visit: Payer: Self-pay | Admitting: Internal Medicine

## 2021-06-04 ENCOUNTER — Other Ambulatory Visit (HOSPITAL_COMMUNITY): Payer: Self-pay

## 2021-06-04 DIAGNOSIS — Z794 Long term (current) use of insulin: Secondary | ICD-10-CM

## 2021-06-04 DIAGNOSIS — E083413 Diabetes mellitus due to underlying condition with severe nonproliferative diabetic retinopathy with macular edema, bilateral: Secondary | ICD-10-CM

## 2021-06-04 MED ORDER — HUMALOG 100 UNIT/ML ~~LOC~~ SOCT
SUBCUTANEOUS | 11 refills | Status: DC
  Filled 2021-06-04 – 2021-08-26 (×2): qty 15, 37d supply, fill #0
  Filled 2021-10-06: qty 15, 37d supply, fill #1
  Filled 2021-11-11: qty 15, 37d supply, fill #2
  Filled 2022-05-13: qty 15, 37d supply, fill #3

## 2021-06-04 NOTE — Telephone Encounter (Signed)
Received TC from patient, who states she called [facility] because she ran out of cartridges for her in pen and was told by pharmacy "a CMA named Jetta Ashi discontinue/completed the RX" and she wasn't able to get them.  She has an upcoming appt w/ [name] on Tuesday, 06/08/21 @ 1:15 and patient states she will need to get the cartridges for the in pens before appt.  [name], Will you please f/u on this on Monday to make sure patient has what she needs for her appt with you? Thank you! Kaeley Vinje

## 2021-06-07 ENCOUNTER — Other Ambulatory Visit (HOSPITAL_COMMUNITY): Payer: Self-pay

## 2021-06-07 NOTE — Telephone Encounter (Signed)
Called pharmacy and rxs for insulins are ready for pick up. Tried calling patient and mail box was full. Left SMS message.

## 2021-06-08 ENCOUNTER — Telehealth: Payer: Self-pay | Admitting: Dietician

## 2021-06-08 ENCOUNTER — Ambulatory Visit: Payer: Medicare Other | Admitting: Dietician

## 2021-06-08 NOTE — Telephone Encounter (Signed)
Called patient to reschedule today's appointment. Unable to reach her as her voicemail mailbox is full.

## 2021-06-11 ENCOUNTER — Other Ambulatory Visit (HOSPITAL_COMMUNITY): Payer: Self-pay

## 2021-06-14 NOTE — Progress Notes (Signed)
Internal Medicine Clinic Attending  Case discussed with Dr. Basaraba  At the time of the visit.  We reviewed the resident's history and exam and pertinent patient test results.  I agree with the assessment, diagnosis, and plan of care documented in the resident's note.  

## 2021-06-17 ENCOUNTER — Encounter (HOSPITAL_COMMUNITY): Payer: Self-pay | Admitting: Emergency Medicine

## 2021-06-17 ENCOUNTER — Other Ambulatory Visit: Payer: Self-pay

## 2021-06-17 ENCOUNTER — Emergency Department (HOSPITAL_COMMUNITY)
Admission: EM | Admit: 2021-06-17 | Discharge: 2021-06-17 | Disposition: A | Discharge status: Home / Self Care | Payer: Medicare Other | Attending: Emergency Medicine | Admitting: Emergency Medicine

## 2021-06-17 DIAGNOSIS — H6121 Impacted cerumen, right ear: Secondary | ICD-10-CM | POA: Diagnosis not present

## 2021-06-17 DIAGNOSIS — Z794 Long term (current) use of insulin: Secondary | ICD-10-CM | POA: Diagnosis not present

## 2021-06-17 DIAGNOSIS — Z87891 Personal history of nicotine dependence: Secondary | ICD-10-CM | POA: Diagnosis not present

## 2021-06-17 DIAGNOSIS — I5032 Chronic diastolic (congestive) heart failure: Secondary | ICD-10-CM | POA: Diagnosis not present

## 2021-06-17 DIAGNOSIS — I132 Hypertensive heart and chronic kidney disease with heart failure and with stage 5 chronic kidney disease, or end stage renal disease: Secondary | ICD-10-CM | POA: Diagnosis not present

## 2021-06-17 DIAGNOSIS — Z992 Dependence on renal dialysis: Secondary | ICD-10-CM | POA: Insufficient documentation

## 2021-06-17 DIAGNOSIS — E1122 Type 2 diabetes mellitus with diabetic chronic kidney disease: Secondary | ICD-10-CM | POA: Insufficient documentation

## 2021-06-17 DIAGNOSIS — I2510 Atherosclerotic heart disease of native coronary artery without angina pectoris: Secondary | ICD-10-CM | POA: Insufficient documentation

## 2021-06-17 DIAGNOSIS — N186 End stage renal disease: Secondary | ICD-10-CM | POA: Diagnosis not present

## 2021-06-17 DIAGNOSIS — Z79899 Other long term (current) drug therapy: Secondary | ICD-10-CM | POA: Diagnosis not present

## 2021-06-17 DIAGNOSIS — H9209 Otalgia, unspecified ear: Secondary | ICD-10-CM | POA: Diagnosis present

## 2021-06-17 DIAGNOSIS — J45909 Unspecified asthma, uncomplicated: Secondary | ICD-10-CM | POA: Diagnosis not present

## 2021-06-17 MED ORDER — ACETAMINOPHEN 500 MG PO TABS
1000.0000 mg | ORAL_TABLET | Freq: Once | ORAL | Status: AC
  Administered 2021-06-17: 1000 mg via ORAL
  Filled 2021-06-17: qty 2

## 2021-06-17 NOTE — Discharge Instructions (Addendum)
Once a week you can use warm water and peroxide and put it in your ear just to keep wax from building up again.

## 2021-06-17 NOTE — ED Triage Notes (Signed)
Pt reports pain and pressure in right ear x 1 week that has worsened over the past few day.

## 2021-06-17 NOTE — ED Provider Notes (Signed)
[facility] DEPT Provider Note   CSN: [id number] Arrival date & time: 06/17/21  1941     History No chief complaint on file.   Jane [name] is a 45 y.o. female.  Patient is a 46 year old female with multiple medical problems including end-stage renal disease on dialysis, diabetes, hypertension, CAD, PAD who is presenting with complaint of ear fullness, decreased hearing and some pain.  She feels like its been present for about a week and gradually worsening.  She has been putting things in her ear and using Q-tips but it is not getting any better.  She has been able to remove a little bit of wax but feels like there is still something stuck in her ear.  She has not had any fever, swelling of her ear or drainage.  She has tried to use wax remover and Q-tips without improvement.  She does not think there is any foreign body in her ear.  The history is provided by the patient.      Past Medical History:  Diagnosis Date   Abnormal uterine bleeding (AUB)    Asthma    prn inhaler   CAD S/P BMS PCI to prox LAD cardiologist--- dr Beverlyn Roux LAD to Mid LAD lesion, 90% stenosed. Post intervention - Vision BMS 3.0 mm x 18 mm (~3.5 mm) there is a 0% residual stenosis. ;   nuclear stress test-- 09-13-2018 low risk with no ischemia, nuclear ef 56%   Charcot foot due to diabetes mellitus ([place]) 11/2016   left 2018; right 2019   Depression    Diabetic foot ulcer ([place]) 04/22/2019   Diabetic neuropathy (HCC)    feet   Diabetic retinopathy, nonproliferative, severe ([place])    bilateral   ESRD (end stage renal disease) on dialysis [facility])    ?chronic interstitial nephritis  ([facility]   H/O non-ST elevation myocardial infarction (NSTEMI) 03/2016   Found in 90% mid LAD lesion treated with bare-metal stent (BMS) PCI - vision BMS 3.0 mm x 18 mm   History of MRSA infection 2007   Hyperlipidemia    Hypertension    IDA (iron deficiency anemia)     Insulin dependent type 2 diabetes mellitus (HCC)    Iron deficiency anemia    takes iron supplement   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 ([place]) 05/23/2018   PAOD (peripheral arterial occlusive disease) ([place])    vascular--- dr Bridgett Larsson   S/P arteriovenous (AV) fistula creation 07/2017   S/p bare metal coronary artery stent 03/31/2016   BMS x1  to pLAD   Sickle cell trait ([place])    Ulcer of left foot due to type 2 diabetes mellitus ([place]) 06/03/2019    Patient Active Problem List   Diagnosis Date Noted   Intractable nausea and vomiting 06/03/2021   Grief 04/01/2021   Diabetic neuropathy ([place]) 01/29/2021   Neck pain 01/07/2021   Chronic diastolic heart failure ([place]) 01/06/2021   Left lateral abdominal pain 02/02/2020   Arthritis of left knee 02/11/2019   Hidradenitis suppurativa 02/12/2018   Anemia in chronic kidney disease 02/12/2018   Secondary hyperparathyroidism of renal origin ([place]) 01/30/2018   Coagulation defect, unspecified ([place]) 01/18/2018   Major depressive disorder 08/11/2017   Sinus tachycardia 02/23/2017   ESRD (end stage renal disease) on dialysis ([place]) 01/05/2017   CAD S/P BMS PCI to prox LAD 03/31/2016   Abnormal uterine bleeding (AUB) 03/17/2016   PAOD (peripheral arterial occlusive disease) ([place]) 01/08/2016  Charcot foot due to diabetes mellitus ([place]) 09/07/2015   Peripheral neuropathy 09/20/2014   Essential hypertension 08/16/2014   Seasonal allergic rhinitis 08/16/2014   Candidal vulvovaginitis 02/09/2014   Morbid obesity ([place]) 09/26/2012   Hyperlipidemia associated with type 2 diabetes mellitus ([place]) 02/26/2009   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, with long-term current use of insulin ([place]) 05/08/2007   Asthma 05/08/2007    Past Surgical History:  Procedure Laterality Date   A/V FISTULAGRAM N/A 04/13/2021   Procedure: A/V FISTULAGRAM - Right Arm;  Surgeon: Serafina Mitchell, MD;  Location: [facility] [facility] [facility];  Service: Cardiovascular;  Laterality: N/A;   ACHILLES TENDON LENGTHENING Left 11/24/2016   Procedure: Left Achilles tendon lengthening (open);  Surgeon: Wylene Simmer, MD;  Location: [facility] [place];  Service: Orthopedics;  Laterality: Left;   AV FISTULA PLACEMENT Right 07/31/2017   Procedure: ARTERIOVENOUS BRACHIOCEPHALIC FISTULA CREATION RIGHT ARM;  Surgeon: Conrad , MD;  Location: [facility] OR;  Service: Vascular;  Laterality: Right;   CALCANEAL OSTEOTOMY Left 11/24/2016   Procedure: Left hindfoot osteotomy and fusion;  Surgeon: Wylene Simmer, MD;  Location: [place];  Service: Orthopedics;  Laterality: Left;   CARDIAC CATHETERIZATION N/A 03/31/2016   Procedure: Left Heart Cath and Coronary Angiography;  Surgeon: Lorretta Harp, MD;  Location: [place] [facility]: 90% early mLAD, normal LV Fxn   CARDIAC CATHETERIZATION N/A 03/31/2016   Procedure: Coronary Stent Intervention;  Surgeon: Lorretta Harp, MD;  Location: [place] [facility];  Service: Cardiovascular: PCI to mLAD BMS Vision 3.0 mm x 18 mm   CESAREAN [place] N/A 09/15/2020   Procedure: DILATATION & CURETTAGE/HYSTEROSCOPY;  Surgeon: Lavonia Drafts, MD;  Location: [place];  Service: Gynecology;  Laterality: N/A;   FISTULA SUPERFICIALIZATION Right 11/15/2017   Procedure: FISTULA SUPERFICIALIZATION RIGHT BRACHIOCEPHALIC  RIGHT;  Surgeon: Conrad , MD;  Location: [place];  Service: Vascular;  Laterality: Right;   FOOT SURGERY     multiple for [place]cot   PERIPHERAL VASCULAR BALLOON ANGIOPLASTY Right 04/13/2021   Procedure: PERIPHERAL VASCULAR BALLOON ANGIOPLASTY;  Surgeon: Serafina Mitchell, MD;  Location: [place] [facility];  Service: Cardiovascular;  Laterality: Right;  arm fistula   TUBAL LIGATION  1997     OB History     Gravida  2   Para  2   Term  1   Preterm  1   AB  0   Living  2      SAB  0   IAB   0   Ectopic  0   Multiple  0   Live Births              Family History  Problem Relation Age of Onset   Stroke Mother    Diabetes Mother    Hypertension Mother    Aneurysm Mother    Diabetes Father    Hypertension Father    Diabetes Sister    Hypertension Sister    Diabetes Maternal Grandmother    Diabetes Maternal Grandfather    Cancer Paternal Grandfather        Prostate    Social History   Tobacco Use   Smoking status: Former    Packs/day: 0.00    Years: 0.50    Pack years: 0.00    Types: Cigarettes    Quit date: 08/29/2015    Years since quitting: 5.8  Smokeless tobacco: Never  Vaping Use   Vaping Use: Never used  Substance Use Topics   Alcohol use: Yes    Alcohol/week: 0.0 standard drinks    Comment: occasional   Drug use: Yes    Types: Marijuana    Comment: 09-08-2020 per pt seldom smokes    Home Medications Prior to Admission medications   Medication Sig Start Date End Date Taking? Authorizing Provider  Accu-Chek Softclix Lancets lancets Check blood sugar up to 3 times  day 02/14/21   Mosetta Anis, MD  acetaminophen (TYLENOL) 500 MG tablet Take 1,000 mg by mouth every 6 (six) hours as needed (for pain.).    [provider]  albuterol (PROVENTIL HFA) 108 (90 Base) MCG/ACT inhaler INHALE 2 PUFFS BY MOUTH EVERY 4 HOURS AS NEEDED FOR COUGHING, WHEEZING, OR SHORTNESS OF BREATH Patient taking differently: Inhale 2 puffs into the lungs every 4 (four) hours as needed for wheezing or shortness of breath. INHALE 2 PUFFS BY MOUTH EVERY 4 HOURS AS NEEDED FOR COUGHING, WHEEZING, OR SHORTNESS OF BREATH 01/29/20   [name], MD  amLODipine (NORVASC) 5 MG tablet TAKE 1 TABLET BY MOUTH EVERY NIGHT AS DIRECTED Patient taking differently: Take 5 mg by mouth daily. 08/18/20 08/18/21  Rosita Fire, MD  blood glucose meter kit and supplies Dispense based on patient and insurance preference. Use up to four times daily as directed. (FOR ICD-10 E10.9,  E11.9). 01/29/21   Sanjuan Dame, MD  Blood Glucose Monitoring Suppl (ACCU-CHEK GUIDE) w/Device KIT 1 each by Does not apply route 3 (three) times daily. 05/18/17   Alphonzo Grieve, MD  Blood Pressure Monitor MISC USE AS DIRECTED FOUR TIMES DAILY. 01/29/21 01/29/22  Sanjuan Dame, MD  Cholecalciferol (VITAMIN D3) 50 MCG (2000 UT) CAPS Take 4 capsules (8,000 Units total) by mouth daily. 02/24/21     Continuous Blood Gluc Receiver (DEXCOM G6 RECEIVER) DEVI Use to check blood sugar at least 6 times a day 02/25/21   [name], MD  Continuous Blood Gluc Sensor (DEXCOM G6 SENSOR) MISC Use to check blood sugar at least 6 times a day 02/25/21   [name], MD  Continuous Blood Gluc Transmit (DEXCOM G6 TRANSMITTER) MISC Use to check blood sugar at least 6 times a day 02/25/21   [name], MD  diclofenac Sodium (VOLTAREN) 1 % GEL APPLY 4 G TOPICALLY 4 (FOUR) TIMES DAILY. 01/07/21 01/07/22  Madalyn Rob, MD  gabapentin (NEURONTIN) 100 MG capsule Take 1 capsule (100 mg total) by mouth at bedtime. 04/28/21   Rehman, Areeg N, DO  glucose blood (ACCU-CHEK GUIDE) test strip Check blood sugar up to 3 times  day 02/14/21   Mosetta Anis, MD  injection device for insulin (INPEN 100-PINK-LILLY-HUMALOG) DEVI Use to inject Humalog insulin for meals and correction insulin 03/17/21   Madalyn Rob, MD  insulin degludec (TRESIBA) 100 UNIT/ML FlexTouch Pen Inject 24 Units into the skin daily. 06/03/21   [name], MD  insulin lispro (HUMALOG) 100 UNIT/ML cartridge Use with inpen to inject at meal time and for correction insulin up to 40 units daily 06/04/21   [name], MD  Insulin Pen Needle 32G X 4 MM MISC Use to inject insulin 4 times a day 02/14/21   Mosetta Anis, MD  Insulin Syringe-Needle U-100 31G X 15/64" 0.3 ML MISC Use to inject insulin daily 08/17/18   Aldine Contes, MD  metoCLOPramide (REGLAN) 10 MG tablet Take 1 tablet (10 mg total) by mouth every 8 (eight) hours  as needed for nausea. 05/27/21    Davonna Belling, MD  metoprolol tartrate (LOPRESSOR) 25 MG tablet Take 1 tablet (25 mg total) by mouth 2 (two) times daily. Do not take the night before dialysis, and take 2 hours after dialysis. 04/06/21   [name], MD  multivitamin (RENA-VIT) TABS tablet Take 1 tablet by mouth daily. 04/30/18   [provider]  naloxone [facility]) nasal spray 4 mg/0.1 mL Place 1 spray into the nose as needed, if found unresponsive then spray into nose and call 911. 02/24/21     ondansetron (ZOFRAN) 4 MG tablet Take 1 tablet (4 mg total) by mouth every 8 (eight) hours as needed for nausea or vomiting. 04/01/21   [name], MD  oxyCODONE-acetaminophen (PERCOCET) 10-325 MG tablet Take 1 tablet by mouth 4 (four) times daily as needed. Patient taking differently: Take 1 tablet by mouth 4 (four) times daily as needed for pain. 05/26/21     sertraline (ZOLOFT) 100 MG tablet Take 0.5 tablets (50 mg total) by mouth daily. 04/01/21   [name], MD  sevelamer carbonate (RENVELA) 800 MG tablet Take 3,200 mg by mouth 3 (three) times daily with meals. Take 800 mg with snacks as needed    [provider]    Allergies    Adhesive [tape]  Review of Systems   Review of Systems  All other systems reviewed and are negative.  Physical Exam Updated Vital Signs There were no vitals taken for this visit.  Physical Exam Vitals and nursing note reviewed.  Constitutional:      General: She is not in acute distress.    Appearance: Normal appearance.  HENT:     Head: Normocephalic.     Right Ear: Ear canal and external ear normal. There is impacted cerumen.     Left Ear: Tympanic membrane and ear canal normal.     Ears:     Comments: No mastoid tenderness or swelling    Mouth/Throat:     Mouth: Mucous membranes are moist.  Eyes:     Pupils: Pupils are equal, round, and reactive to light.  Pulmonary:     Effort: Pulmonary effort is normal.  Musculoskeletal:     Cervical back: Normal range  of motion and neck supple.     Comments: Fistula present in the right upper arm  Skin:    General: Skin is warm and dry.  Neurological:     Mental Status: She is alert. Mental status is at baseline.  Psychiatric:        Mood and Affect: Mood normal.        Behavior: Behavior normal.    ED Results / Procedures / Treatments   Labs (all labs ordered are listed, but only abnormal results are displayed) Labs Reviewed - No data to display  EKG None  Radiology No results found.  Procedures Procedures   Medications Ordered in ED Medications - No data to display  ED Course  I have reviewed the triage vital signs and the nursing notes.  Pertinent labs & imaging results that were available during my care of the patient were reviewed by me and considered in my medical decision making (see chart for details).    MDM Rules/Calculators/A&P                           Patient here today complaining of right ear pain and decreased hearing.  Patient has cerumen impaction on the  right.  No evidence of abscess or abnormality in the ear canal.  No mastoid tenderness concerning for mastoiditis.  No preauricular swelling.  No findings to suggest otitis externa.  Will irrigate the ear and reevaluate to ensure no otitis media.  8:56 PM Wax removed and on repeat exam canal is normal and TM on the right side is normal.  Final Clinical Impression(s) / ED Diagnoses Final diagnoses:  Impacted cerumen of right ear    Rx / DC Orders ED Discharge Orders     None        Blanchie Dessert, MD 06/17/21 2056

## 2021-06-21 ENCOUNTER — Other Ambulatory Visit: Payer: Self-pay | Admitting: Obstetrics & Gynecology

## 2021-06-21 DIAGNOSIS — Z1231 Encounter for screening mammogram for malignant neoplasm of breast: Secondary | ICD-10-CM

## 2021-06-22 ENCOUNTER — Ambulatory Visit: Payer: Medicare Other

## 2021-06-22 ENCOUNTER — Telehealth: Payer: Self-pay | Admitting: Dietician

## 2021-06-22 NOTE — Chronic Care Management (AMB) (Signed)
Care Management    RN Visit Note  06/22/2021 Name: Jane [name] MRN: [id number] DOB: [birth year]  Subjective: Jane [name] is a 45 y.o. year old female who is a primary care patient of Jane Persia, MD. The care management team was consulted for assistance with disease management and care coordination needs.    Engaged with patient by telephone for follow up visit in response to provider referral for case management and/or care coordination services.   Consent to Services:   Jane [name] was given information about Care Management services today including:  Care Management services includes personalized support from designated clinical staff supervised by her physician, including individualized plan of care and coordination with other care providers 24/7 contact phone numbers for assistance for urgent and routine care needs. The patient may stop case management services at any time by phone call to the office staff.  Patient agreed to services and consent obtained.    Assessment: Patient continues to experience difficulty with getting her Dexcom replaced. See Care Plan below for interventions and patient self-care actives. Follow up Plan: Patient would like continued follow-up.  CCM RNCM will outreach the patient within the next 30 days.  Patient will call office if needed prior to next encounter : Review of patient past medical history, allergies, medications, health status, including review of consultants reports, laboratory and other test data, was performed as part of comprehensive evaluation and provision of chronic care management services.   SDOH (Social Determinants of Health) assessments and interventions performed:    Care Plan  Allergies  Allergen Reactions   Adhesive [Tape] Other (See Comments)    Irritation     Outpatient Encounter Medications as of 06/22/2021  Medication Sig Note   Accu-Chek Softclix Lancets lancets Check blood sugar up to 3 times   day    acetaminophen (TYLENOL) 500 MG tablet Take 1,000 mg by mouth every 6 (six) hours as needed (for pain.).    albuterol (PROVENTIL HFA) 108 (90 Base) MCG/ACT inhaler INHALE 2 PUFFS BY MOUTH EVERY 4 HOURS AS NEEDED FOR COUGHING, WHEEZING, OR SHORTNESS OF BREATH (Patient taking differently: Inhale 2 puffs into the lungs every 4 (four) hours as needed for wheezing or shortness of breath. INHALE 2 PUFFS BY MOUTH EVERY 4 HOURS AS NEEDED FOR COUGHING, WHEEZING, OR SHORTNESS OF BREATH)    amLODipine (NORVASC) 5 MG tablet TAKE 1 TABLET BY MOUTH EVERY NIGHT AS DIRECTED (Patient taking differently: Take 5 mg by mouth daily.)    blood glucose meter kit and supplies Dispense based on patient and insurance preference. Use up to four times daily as directed. (FOR ICD-10 E10.9, E11.9).    Blood Glucose Monitoring Suppl (ACCU-CHEK GUIDE) w/Device KIT 1 each by Does not apply route 3 (three) times daily.    Blood Pressure Monitor MISC USE AS DIRECTED FOUR TIMES DAILY.    Cholecalciferol (VITAMIN D3) 50 MCG (2000 UT) CAPS Take 4 capsules (8,000 Units total) by mouth daily.    Continuous Blood Gluc Receiver (DEXCOM G6 RECEIVER) DEVI Use to check blood sugar at least 6 times a day    Continuous Blood Gluc Sensor (DEXCOM G6 SENSOR) MISC Use to check blood sugar at least 6 times a day    Continuous Blood Gluc Transmit (DEXCOM G6 TRANSMITTER) MISC Use to check blood sugar at least 6 times a day    diclofenac Sodium (VOLTAREN) 1 % GEL APPLY 4 G TOPICALLY 4 (FOUR) TIMES DAILY.    gabapentin (NEURONTIN) 100 MG capsule  Take 1 capsule (100 mg total) by mouth at bedtime.    glucose blood (ACCU-CHEK GUIDE) test strip Check blood sugar up to 3 times  day    injection device for insulin (INPEN 100-PINK-LILLY-HUMALOG) DEVI Use to inject Humalog insulin for meals and correction insulin    insulin degludec (TRESIBA) 100 UNIT/ML FlexTouch Pen Inject 24 Units into the skin daily.    insulin lispro (HUMALOG) 100 UNIT/ML cartridge Use  with inpen to inject at meal time and for correction insulin up to 40 units daily    Insulin Pen Needle 32G X 4 MM MISC Use to inject insulin 4 times a day    Insulin Syringe-Needle U-100 31G X 15/64" 0.3 ML MISC Use to inject insulin daily    metoCLOPramide (REGLAN) 10 MG tablet Take 1 tablet (10 mg total) by mouth every 8 (eight) hours as needed for nausea.    metoprolol tartrate (LOPRESSOR) 25 MG tablet Take 1 tablet (25 mg total) by mouth 2 (two) times daily. Do not take the night before dialysis, and take 2 hours after dialysis.    multivitamin (RENA-VIT) TABS tablet Take 1 tablet by mouth daily.    naloxone (NARCAN) nasal spray 4 mg/0.1 mL Place 1 spray into the nose as needed, if found unresponsive then spray into nose and call 911. 04/13/2021: Never taken     ondansetron (ZOFRAN) 4 MG tablet Take 1 tablet (4 mg total) by mouth every 8 (eight) hours as needed for nausea or vomiting.    oxyCODONE-acetaminophen (PERCOCET) 10-325 MG tablet Take 1 tablet by mouth 4 (four) times daily as needed. (Patient taking differently: Take 1 tablet by mouth 4 (four) times daily as needed for pain.)    sertraline (ZOLOFT) 100 MG tablet Take 0.5 tablets (50 mg total) by mouth daily.    sevelamer carbonate (RENVELA) 800 MG tablet Take 3,200 mg by mouth 3 (three) times daily with meals. Take 800 mg with snacks as needed    No facility-administered encounter medications on file as of 06/22/2021.    Patient Active Problem List   Diagnosis Date Noted   Intractable nausea and vomiting 06/03/2021   Grief 04/01/2021   Diabetic neuropathy ([place]) 01/29/2021   Neck pain 01/07/2021   Chronic diastolic heart failure ([place]) 01/06/2021   Left lateral abdominal pain 02/02/2020   Arthritis of left knee 02/11/2019   Hidradenitis suppurativa 02/12/2018   Anemia in chronic kidney disease 02/12/2018   Secondary hyperparathyroidism of renal origin ([place]) 01/30/2018   Coagulation defect, unspecified ([place]) 01/18/2018   Major  depressive disorder 08/11/2017   Sinus tachycardia 02/23/2017   ESRD (end stage renal disease) on dialysis ([place]) 01/05/2017   CAD S/P BMS PCI to prox LAD 03/31/2016   Abnormal uterine bleeding (AUB) 03/17/2016   PAOD (peripheral arterial occlusive disease) ([place]) 01/08/2016   Charcot foot due to diabetes mellitus ([place]) 09/07/2015   Peripheral neuropathy 09/20/2014   Essential hypertension 08/16/2014   Seasonal allergic rhinitis 08/16/2014   Candidal vulvovaginitis 02/09/2014   Morbid obesity ([place]) 09/26/2012   Hyperlipidemia associated with type 2 diabetes mellitus ([place]) 02/26/2009   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, with long-term current use of insulin ([place]) 05/08/2007   Asthma 05/08/2007    Conditions to be addressed/monitored: CAD, HTN, and DMII  Care Plan : CCM RN Diabetes Type 2 (Adult)  Updates made by Johnney Killian, RN since 06/22/2021 12:00 AM     Problem: Disease Progression (Diabetes, Type 2)  Goal: Disease Progression Prevented or Minimized   Start Date: 01/31/2020  Recent Progress: On track  Priority: Low  Note:   CARE PLAN ENTRY (see longitudinal plan of care for additional care plan information)  Current Barriers:  Chronic Disease Management support, education, and care coordination needs related to CAD, HTN, HLD, DMII, and ESRD- Successful call to patient this morning.  Patient states she has not called and corrected the issue with her Dexcom as the sensors are defective and the barcode is incorrect.  She did attempt to call them and get the situation corrected but the company wanted to bar codes and she was not home at the time she spoke with them.  Patient noted she is in the beginning process of qualifying for a kidney transplant through [facility] and she knows she needs to have her Diabetes under control.  She has not been checking her CBG's, she takes her insulin when she is going to eat.  Consulted with Debera Lat, RD  after received another call from patient and she was able to get in touch with [facility] (where she obtains her supplies) and they need another prescription for her Dexcom.  Patient asked if the prescription could be for a longer period of time than 3 months.  Plan to keep an eye out for prescription request to help expedite the replacement Dexcom.  Clinical Goal(s) related to CAD, HTN, HLD, DMII, and ESRD:  Over the next 30-60 days, patient will:  Work with the care management team to address educational, disease management, and care coordination needs  Begin or continue self health monitoring activities as directed today Call provider office for new or worsened signs and symptoms New or worsened symptom related to DM, HTN, CAD, ESRD Call care management team with questions or concerns Verbalize basic understanding of patient centered plan of care established today  Interventions related to CAD, HTN, HLD, DMII, and ESRD:  Prior to interviewing patient, reviewed patient status, including review of recent office visit notes, consultants reports, relevant laboratory and other test results, and medications, completed. Provided patient with the opportunity to discuss the death of her mother and emotional support provided Assessed patient's self management skills in regards to HTN, IDDM, HLD, CAD and ESRD  Evaluation of current treatment plan related to HTN and IDDM, HLD and ESRD and patient's adherence to plan as established by provider.- Patient addressing issues with Dexcom  Reviewed medications with patient and assessed medication taking behavior Assessed patient's satisfaction with CGM Rx and reviewed episodes of hypoglycemia and hyperglycemia Reviewed how to treat hypoglycemia and encouraged patient to carry sources of quick sugar with her at all times especially when at dialysis center  Much positive reinforcement given to patient for taking charge of her health issues and using the tools and her  knowledge to improve her health despite the deaths of close friends and family in the last year- Patient is attending grief counseling weekly and she finds it helpful for someone just to listen. Reviewed upcoming appointments including: cardiology appointment on 8/25  Patient Self Care Activities related to CAD, HTN, HLD, DMII, and ESRD:  Patient is unable to independently self-manage chronic health conditions      Plan: Telephone follow up appointment with care management team member scheduled for:  62 days  Johnney Killian, RN, BSN, CCM Care Management Coordinator [facility] Internal Medicine Phone: [phone] / Fax: [phone]

## 2021-06-22 NOTE — Patient Instructions (Signed)
Visit Information   Goals Addressed             This Visit's Progress    Blood Pressure < 140/90       BP Readings from Last 3 Encounters:  06/17/21 (!) 171/89  06/03/21 115/66  05/27/21 (!) 108/53          HEMOGLOBIN A1C < 7       Lab Results  Component Value Date   HGBA1C 6.8 (A) 04/01/2021   HGBA1C 9.0 (A) 01/07/2021   HGBA1C 9.5 (A) 01/29/2020   Lab Results  Component Value Date   MICROALBUR 40.6 (H) 08/05/2014   LDLCALC 66 01/27/2021   CREATININE 10.60 (H) 05/26/2021   Not meeting Hgb A1C goals- discussed strategies to improve diabetes management      LDL CALC < 100       Lab Results  Component Value Date   CHOL 130 01/27/2021   HDL 44 01/27/2021   LDLCALC 66 01/27/2021   TRIG 107 01/27/2021   CHOLHDL 3.0 01/27/2021   Encouraged patient to have lipid panel drawn ,that were ordered 06/22/20,when she has her preoperative labs      Make and Keep All Appointments       Timeframe:  Long-Range Goal Priority:  High Start Date:         10/30/20                    Expected End Date:    ongoing                 Follow Up Date 08/13/21   - ask family or friend for a ride - call to cancel if needed - keep a calendar with appointment dates    Why is this important?   Part of staying healthy is seeing the doctor for follow-up care.  If you forget your appointments, there are some things you can do to stay on track.    Notes: Patient missed appt with dietician 7/22        The patient verbalized understanding of instructions, educational materials, and care plan provided today and declined offer to receive copy of patient instructions, educational materials, and care plan.   Telephone follow up appointment with care management team member scheduled for: 07/20/21@10 :[place], RN, BSN, CCM Care Management Coordinator [facility] Internal Medicine Phone: [phone] / Fax: [phone]

## 2021-06-22 NOTE — Telephone Encounter (Signed)
Called Jane [name] per request of CCM case manager to see if I could help her restart her Dexcom CGM and/or schedule an appointment to help her with her diabetes. Sua was calling [facility] to order more Dexcom sensors and said she would call me back.

## 2021-06-25 ENCOUNTER — Other Ambulatory Visit (HOSPITAL_COMMUNITY): Payer: Self-pay

## 2021-06-25 MED ORDER — OXYCODONE-ACETAMINOPHEN 10-325 MG PO TABS
1.0000 | ORAL_TABLET | Freq: Every day | ORAL | 0 refills | Status: DC | PRN
  Filled 2021-06-25: qty 150, 30d supply, fill #0

## 2021-07-06 ENCOUNTER — Other Ambulatory Visit (HOSPITAL_COMMUNITY): Payer: Self-pay

## 2021-07-06 MED ORDER — FLUTICASONE PROPIONATE 50 MCG/ACT NA SUSP
2.0000 | Freq: Every evening | NASAL | 5 refills | Status: DC
  Filled 2021-07-06 – 2021-07-28 (×2): qty 16, 30d supply, fill #0
  Filled 2022-05-13: qty 16, 30d supply, fill #1

## 2021-07-08 ENCOUNTER — Ambulatory Visit: Payer: Medicare Other | Admitting: Cardiology

## 2021-07-08 NOTE — Progress Notes (Deleted)
Primary Care Provider: [name], MD Cardiologist: Glenetta Hew, MD Electrophysiologist: None  Clinic Note: No chief complaint on file.  ===================================  ASSESSMENT/PLAN   Problem List Items Addressed This Visit       Cardiology Problems   PAOD (peripheral arterial occlusive disease) ([place]) (Chronic)   CAD S/P BMS PCI to prox LAD - Primary (Chronic)   Hypertensive heart disease with chronic diastolic congestive heart failure (HCC) (Chronic)     Other   Hyperlipidemia associated with type 2 diabetes mellitus ([place]) (Chronic)   Morbid obesity ([place]) (Chronic)    ===================================  HPI:    Jane [name] is a 45 y.o. female with a PMH notable for severe/poorly controlled DM-2 (history of Charcot foot with multiple surgeries-ESRD on HD, with single-vessel CAD (BMS to LAD in May 2017 in setting of non-STEMI along with Hypertensive Heart Disease with Chronic HFpEF who presents today for 28-month follow-up.  Jane [name] was last seen on January 06, 2021 following a recent COVID-19 infection.  Unable to titrate medications further because of hypotension during dialysis.  No anginal symptoms.  Recheck 2D echocardiogram because of dyspnea although it was felt to be probably related to her recent COVID infection.  Recent Hospitalizations:  05/26/2021: ER visit for nausea and vomiting.  This occurred following dialysis.  She did not go to dialysis the previous session because of feeling poorly.  No fevers or chills.  They removed 3 Kg versus 4 because she became hypotensive..  She then became dizzy and lightheaded.  Nausea continued despite blood pressure control.  No chest pain.  Treated with Reglan for possible gastroparesis.  Felt better.  Reviewed  CV studies:    The following studies were reviewed today: (if available, images/films reviewed: From Epic Chart or Care Everywhere) 02/18/2021-Echo: Moderate posterior with overall mild  LVH.  EF 55 to 60% (previously read is 60 to 65%).  Normal wall motion.  Indeterminate diastolic function.  Normal RV size and function with normal PA and RA pressures.  Essentially normal valves.  Interval History:   Jane [name]   CV Review of Symptoms (Summary) Cardiovascular ROS: {roscv:310661}  REVIEWED OF SYSTEMS   ROS  Constitutional: Positive for malaise/fatigue (Has not been as energetic since Covid). Negative for weight loss.       No further fevers and chills for the last week or so  HENT: Positive for congestion. Negative for nosebleeds.   Respiratory: Positive for cough (This is clearing up, but still present) and shortness of breath (Worse since Covid.  Both from the infection and from last dialysis.).   Cardiovascular: Positive for leg swelling.  Gastrointestinal: Negative for blood in stool and melena.  Genitourinary: Negative for hematuria.  Musculoskeletal: Positive for joint pain (Right foot still hurts some.).  Neurological: Positive for dizziness (Only gets dizzy when her blood pressure drops during dialysis.). Negative for focal weakness.  Endo/Heme/Allergies: Positive for environmental allergies.  Psychiatric/Behavioral: Negative for memory loss. The patient has insomnia. The patient is not nervous/anxious.    I have reviewed and (if needed) personally updated the patient's problem list, medications, allergies, past medical and surgical history, social and family history.   PAST MEDICAL HISTORY   Past Medical History:  Diagnosis Date   Abnormal uterine bleeding (AUB)    Asthma    prn inhaler   CAD S/P BMS PCI to prox LAD cardiologist--- dr Beverlyn Roux LAD to Mid LAD lesion, 90% stenosed. Post intervention - Vision BMS 3.0 mm  x 18 mm (~3.5 mm) there is a 0% residual stenosis. ;   nuclear stress test-- 09-13-2018 low risk with no ischemia, nuclear ef 56%   Charcot foot due to diabetes mellitus ([place]) 11/2016   left 2018; right 2019   Depression     Diabetic foot ulcer ([place]) 04/22/2019   Diabetic neuropathy (HCC)    feet   Diabetic retinopathy, nonproliferative, severe ([place])    bilateral   ESRD (end stage renal disease) on dialysis [facility])    ?chronic interstitial nephritis  ([facility]   H/O non-ST elevation myocardial infarction (NSTEMI) 03/2016   Found in 90% mid LAD lesion treated with bare-metal stent (BMS) PCI - vision BMS 3.0 mm x 18 mm   History of MRSA infection 2007   Hyperlipidemia    Hypertension    IDA (iron deficiency anemia)    Insulin dependent type 2 diabetes mellitus (HCC)    Iron deficiency anemia    takes iron supplement   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 ([place]) 05/23/2018   PAOD (peripheral arterial occlusive disease) ([place])    vascular--- dr Bridgett Larsson   S/P arteriovenous (AV) fistula creation 07/2017   S/p bare metal coronary artery stent 03/31/2016   BMS x1  to pLAD   Sickle cell trait (HCC)    Ulcer of left foot due to type 2 diabetes mellitus ([place]) 06/03/2019    PAST SURGICAL HISTORY   Past Surgical History:  Procedure Laterality Date   A/V FISTULAGRAM N/A 04/13/2021   Procedure: A/V FISTULAGRAM - Right Arm;  Surgeon: Serafina Mitchell, MD;  Location: [place] [facility];  Service: Cardiovascular;  Laterality: N/A;   ACHILLES TENDON LENGTHENING Left 11/24/2016   Procedure: Left Achilles tendon lengthening (open);  Surgeon: Wylene Simmer, MD;  Location: [place];  Service: Orthopedics;  Laterality: Left;   AV FISTULA PLACEMENT Right 07/31/2017   Procedure: ARTERIOVENOUS BRACHIOCEPHALIC FISTULA CREATION RIGHT ARM;  Surgeon: Conrad Catawba, MD;  Location: [facility] OR;  Service: Vascular;  Laterality: Right;   CALCANEAL OSTEOTOMY Left 11/24/2016   Procedure: Left hindfoot osteotomy and fusion;  Surgeon: Wylene Simmer, MD;  Location: [place];  Service: Orthopedics;  Laterality: Left;   CARDIAC CATHETERIZATION N/A 03/31/2016   Procedure: Left Heart Cath and  Coronary Angiography;  Surgeon: Lorretta Harp, MD;  Location: [place] CV LAB: 90% early mLAD, normal LV Fxn   CARDIAC CATHETERIZATION N/A 03/31/2016   Procedure: Coronary Stent Intervention;  Surgeon: Lorretta Harp, MD;  Location: [place] CV LAB;  Service: Cardiovascular: PCI to mLAD BMS Vision 3.0 mm x 18 mm   CESAREAN [place] N/A 09/15/2020   Procedure: DILATATION & CURETTAGE/HYSTEROSCOPY;  Surgeon: Lavonia Drafts, MD;  Location: [place];  Service: Gynecology;  Laterality: N/A;   FISTULA SUPERFICIALIZATION Right 11/15/2017   Procedure: FISTULA SUPERFICIALIZATION RIGHT BRACHIOCEPHALIC  RIGHT;  Surgeon: Conrad Alvord, MD;  Location: [place];  Service: Vascular;  Laterality: Right;   FOOT SURGERY     multiple for [place]   PERIPHERAL VASCULAR BALLOON ANGIOPLASTY Right 04/13/2021   Procedure: PERIPHERAL VASCULAR BALLOON ANGIOPLASTY;  Surgeon: Serafina Mitchell, MD;  Location: [place] [facility];  Service: Cardiovascular;  Laterality: Right;  arm fistula   TUBAL LIGATION  1997   Cardiac catheterization with PCI May 2017:90% mLAD; nl LV Fxn --> PCI - Vision BMS 3.93mm x 18 mm  Immunization History  Administered Date(s) Administered   19-influenza Whole 07/30/2019   Hepatitis B, adult 01/25/2018, 02/24/2018, 03/22/2018, 03/24/2018, 07/26/2018   Influenza Whole 01/11/2009, 07/22/2011   Influenza,inj,Quad PF,6+ Mos 08/05/2014, 12/24/2015, 08/04/2016, 08/29/2017, 08/01/2018, 07/30/2019, 09/24/2020   Moderna Sars-Covid-2 Vaccination 02/27/2020, 03/26/2020   PFIZER(Purple Top)SARS-COV-2 Vaccination 08/13/2020   Pneumococcal Conjugate-13 01/30/2018, 08/01/2018   Pneumococcal Polysaccharide-23 01/11/2009, 08/15/2014, 03/31/2018   Tdap 10/28/2013    MEDICATIONS/ALLERGIES   No outpatient medications have been marked as taking for the 07/08/21 encounter (Appointment) with [name], MD.     Allergies  Allergen Reactions   Adhesive [Tape] Other (See Comments)    Irritation     SOCIAL HISTORY/FAMILY HISTORY   Reviewed in Epic:  Pertinent findings:  Social History   Tobacco Use   Smoking status: Former    Packs/day: 0.00    Years: 0.50    Pack years: 0.00    Types: Cigarettes    Quit date: 08/29/2015    Years since quitting: 5.8   Smokeless tobacco: Never  Vaping Use   Vaping Use: Never used  Substance Use Topics   Alcohol use: Yes    Alcohol/week: 0.0 standard drinks    Comment: occasional   Drug use: Yes    Types: Marijuana    Comment: 09-08-2020 per pt seldom smokes   Social History   Social History Narrative   Patient does not drink caffeine.   Patient is right handed.       --> 2020 was a difficult year: Her sister died in 04-25-2023 (potentially because of COVID-19), her recent ex-boyfriend died in either 03/26/2023 or April 25, 2023, this was followed by one of her close friends being murdered by the boyfriend.    --> Good news for 2020 was that she had a new grandkids.    OBJCTIVE -PE, EKG, labs   Wt Readings from Last 3 Encounters:  06/03/21 222 lb 6.4 oz (100.9 kg)  05/26/21 217 lb (98.4 kg)  04/19/21 222 lb (100.7 kg)    Physical Exam: There were no vitals taken for this visit. Physical Exam   Appearance: She is ill-appearing (Mild.  This does not look like she feels as good today as she has in the past.). She is not toxic-appearing.     Comments: Well-groomed.  Moderate-morbidly obese.  HENT:     Head: Normocephalic and atraumatic.  Neck:     Vascular: No carotid bruit (Radiating fistula bruit) or JVD.  Cardiovascular:     Rate and Rhythm: Normal rate and regular rhythm. Occasional extrasystoles are present.    Chest Wall: PMI is not displaced.     Pulses: Intact distal pulses. Decreased pulses (Decreased palpable pedal pulses and radial pulse).     Heart sounds: S1 normal and S2 normal. Heart sounds are distant. No murmur (Probably more radiated  fistula bruit) heard. No friction rub. Gallop present. S4 sounds present.   Pulmonary:     Effort: Pulmonary effort is normal. No respiratory distress.     Comments: She did have some mild crackles on exam but no rales or rhonchi. Chest:     Chest wall: Tenderness (Mildly tender to touch from coughing.) present.  Musculoskeletal:        General: Swelling (Trace bilateral ankle) present.     Cervical back: Normal range of motion and neck supple.     Right lower leg: Edema (Right foot almost lookS back to normal.) present.  Skin:    General: Skin is warm and dry.  Neurological:     General: No focal deficit present.     Mental Status: She is alert and oriented to person, place, and time.  Psychiatric:        Behavior: Behavior normal.        Thought Content: Thought content normal.        Judgment: Judgment normal.     Comments: Seems a little bit down today.  Was doing really well until she got the Covid    Adult ECG Report  Rate: *** ;  Rhythm: {rhythm:17366};   Narrative Interpretation: ***  Recent Labs:  ***  Lab Results  Component Value Date   CHOL 130 01/27/2021   HDL 44 01/27/2021   LDLCALC 66 01/27/2021   TRIG 107 01/27/2021   CHOLHDL 3.0 01/27/2021   Lab Results  Component Value Date   CREATININE 10.60 (H) 05/26/2021   BUN 32 (H) 05/26/2021   NA 133 (L) 05/26/2021   K 5.0 05/26/2021   CL 92 (L) 05/26/2021   CO2 31 05/26/2021   CBC Latest Ref Rng & Units 05/26/2021 04/13/2021 01/29/2020  WBC 4.0 - 10.5 K/uL 5.8 - 5.1  Hemoglobin 12.0 - 15.0 g/dL 10.7(L) 10.9(L) 11.4  Hematocrit 36.0 - 46.0 % 34.7(L) 32.0(L) 36.6  Platelets 150 - 400 K/uL 241 - 229    Lab Results  Component Value Date   HGBA1C 6.8 (A) 04/01/2021   Lab Results  Component Value Date   TSH 0.91 06/15/2016    ==================================================  COVID-19 Education: The signs and symptoms of COVID-19 were discussed with the patient and how to seek care for testing (follow up  with PCP or arrange E-visit).    I spent a total of ***minutes with the patient spent in direct patient consultation.  Additional time spent with chart review  / charting (studies, outside notes, etc): *** min Total Time: *** min  Current medicines are reviewed at length with the patient today.  (+/- concerns) ***  This visit occurred during the SARS-CoV-2 public health emergency.  Safety protocols were in place, including screening questions prior to the visit, additional usage of staff PPE, and extensive cleaning of exam room while observing appropriate contact time as indicated for disinfecting solutions.  Notice: This dictation was prepared with Dragon dictation along with smaller phrase technology. Any transcriptional errors that result from this process are unintentional and may not be corrected upon review.  Patient Instructions / Medication Changes & Studies & Tests Ordered   There are no Patient Instructions on file for this visit.   Studies Ordered:   No orders of the defined types were placed in this encounter.    Glenetta Hew, M.D., M.S. Interventional Cardiologist   Pager # [phone] Phone # [phone] [address] [place], [place] [address]   Thank you for choosing Heartcare at [facility]!!

## 2021-07-14 ENCOUNTER — Other Ambulatory Visit (HOSPITAL_COMMUNITY): Payer: Self-pay

## 2021-07-20 ENCOUNTER — Telehealth: Payer: Medicare Other

## 2021-07-20 ENCOUNTER — Other Ambulatory Visit: Payer: Self-pay | Admitting: Internal Medicine

## 2021-07-20 ENCOUNTER — Telehealth: Payer: Self-pay

## 2021-07-20 DIAGNOSIS — E083413 Diabetes mellitus due to underlying condition with severe nonproliferative diabetic retinopathy with macular edema, bilateral: Secondary | ICD-10-CM

## 2021-07-20 MED ORDER — DEXCOM G6 TRANSMITTER MISC: 3 refills | Status: DC

## 2021-07-20 MED ORDER — DEXCOM G6 SENSOR MISC: 3 refills | Status: DC

## 2021-07-20 NOTE — Telephone Encounter (Signed)
  Care Management   Outreach Note  07/20/2021 Name: Jane [name] MRN: [id number] DOB: September 24, 1976  Referred by: [name], MD Reason for referral : No chief complaint on file.   An unsuccessful telephone outreach was attempted today. The patient was referred to the case management team for assistance with care management and care coordination.   Follow Up Plan: If patient returns call to provider office, please advise to call [place] [facility] at [phone] to reschedule appointment.  Johnney Killian, RN, BSN, CCM Care Management Coordinator [facility] Internal Medicine Phone: [phone] / Fax: [phone]

## 2021-07-21 ENCOUNTER — Telehealth: Payer: Self-pay | Admitting: Cardiology

## 2021-07-21 NOTE — Telephone Encounter (Signed)
Call transferred to triage.  Pt currently receiving dialysis and having palpitations.    Pt takes metoprolol BID, but holds her night time dose prior to her dialysis, and then takes next dose 2 hours after dialysis complete.  Pt missed her last visit with Dr. [name].  Advised Pt to take her metoprolol per normal routine.  If palpitations do not improve after taking her next dose of metoprolol advised to call office for further advisement.  Pt scheduled for follow up next Tuesday d/t missed appt.

## 2021-07-21 NOTE — Telephone Encounter (Signed)
Patient c/o Palpitations:  High priority if patient c/o lightheadedness, shortness of breath, or chest pain  How long have you had palpitations/irregular HR/ Afib? Are you having the symptoms now?  Irregular HR started this morning while in Dialysis but they gave her oxygen and she's feeling better now  Are you currently experiencing lightheadedness, SOB or CP? Chest pressure, lightheadedness  Do you have a history of afib (atrial fibrillation) or irregular heart rhythm?  Yes  Have you checked your BP or HR? (document readings if available): 169/over something?   Are you experiencing any other symptoms? Sweating   Pt recently lost her mom and she's not sure if this has anything to do with the  Irregular Heart rate

## 2021-07-26 ENCOUNTER — Other Ambulatory Visit (HOSPITAL_COMMUNITY): Payer: Self-pay

## 2021-07-26 MED ORDER — OXYCODONE-ACETAMINOPHEN 10-325 MG PO TABS
1.0000 | ORAL_TABLET | Freq: Every day | ORAL | 0 refills | Status: DC | PRN
  Filled 2021-07-26: qty 150, 30d supply, fill #0

## 2021-07-26 NOTE — Progress Notes (Signed)
Office Visit    Patient Name: Jane [name] Date of Encounter: 07/27/2021  PCP:  [name], [place]  Cardiologist:  Glenetta Hew, MD  Advanced Practice Provider:  No care team member to display Electrophysiologist:  None     Chief Complaint    [name] is a 45 y.o. female with a hx of palpitations, sinus tachycardia, CAD s/p BMS PCi to prox LAD in setting of NSTEMI in 3536, chronic diastolic heart failure, Dm2, obesity, HTN, HLD, ESRD on dialysis presents today for palpitations   Past Medical History    Past Medical History:  Diagnosis Date   Abnormal uterine bleeding (AUB)    Asthma    prn inhaler   CAD S/P BMS PCI to prox LAD cardiologist--- dr Beverlyn Roux LAD to Mid LAD lesion, 90% stenosed. Post intervention - Vision BMS 3.0 mm x 18 mm (~3.5 mm) there is a 0% residual stenosis. ;   nuclear stress test-- 09-13-2018 low risk with no ischemia, nuclear ef 56%   Charcot foot due to diabetes mellitus ([place]) 11/2016   left 2018; right 2019   Depression    Diabetic foot ulcer ([place]) 04/22/2019   Diabetic neuropathy (HCC)    feet   Diabetic retinopathy, nonproliferative, severe ([place])    bilateral   ESRD (end stage renal disease) on dialysis [facility])    ?chronic interstitial nephritis  ([facility]   H/O non-ST elevation myocardial infarction (NSTEMI) 03/2016   Found in 90% mid LAD lesion treated with bare-metal stent (BMS) PCI - vision BMS 3.0 mm x 18 mm   History of MRSA infection 2007   Hyperlipidemia    Hypertension    IDA (iron deficiency anemia)    Insulin dependent type 2 diabetes mellitus (HCC)    Iron deficiency anemia    takes iron supplement   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 [facility];  Service: Cardiovascular;  Laterality: N/A;   ACHILLES TENDON LENGTHENING Left 11/24/2016   Procedure: Left Achilles tendon lengthening (open);  Surgeon: Wylene Simmer, MD;  Location: [place];  Service: Orthopedics;  Laterality: Left;   AV FISTULA PLACEMENT Right 07/31/2017   Procedure: ARTERIOVENOUS BRACHIOCEPHALIC FISTULA CREATION RIGHT ARM;  Surgeon: Conrad [place], MD;  Location: [facility] OR;  Service: Vascular;  Laterality: Right;   CALCANEAL OSTEOTOMY Left 11/24/2016   Procedure: Left hindfoot osteotomy and fusion;  Surgeon: Wylene Simmer, MD;  Location: [place];  Service: Orthopedics;  Laterality: Left;   CARDIAC CATHETERIZATION N/A 03/31/2016   Procedure: Left Heart Cath and Coronary Angiography;  Surgeon: Lorretta Harp, MD;  Location: [place] [facility]: 90% early mLAD, normal LV Fxn   CARDIAC CATHETERIZATION N/A 03/31/2016   Procedure: Coronary Stent Intervention;  Surgeon: Lorretta Harp, MD;  Location: [place] [facility];  Service: Cardiovascular: PCI to mLAD BMS Vision 3.0 mm  x 18 mm   CESAREAN SECTION  1997   DILITATION & CURRETTAGE/HYSTROSCOPY WITH NOVASURE ABLATION N/A 09/15/2020   Procedure: DILATATION & CURETTAGE/HYSTEROSCOPY;  Surgeon: Lavonia Drafts, MD;  Location: [place];  Service: Gynecology;  Laterality: N/A;   FISTULA SUPERFICIALIZATION Right 11/15/2017   Procedure: FISTULA SUPERFICIALIZATION RIGHT BRACHIOCEPHALIC  RIGHT;  Surgeon: Conrad Menlo, MD;  Location: [place];  Service: Vascular;  Laterality: Right;   FOOT SURGERY     multiple for [place]cot   PERIPHERAL VASCULAR BALLOON ANGIOPLASTY Right 04/13/2021   Procedure: PERIPHERAL  VASCULAR BALLOON ANGIOPLASTY;  Surgeon: Serafina Mitchell, MD;  Location: [place] [facility];  Service: Cardiovascular;  Laterality: Right;  arm fistula   TUBAL LIGATION  1997    Allergies  Allergies  Allergen Reactions   Adhesive [Tape] Other (See Comments)    Irritation     History of Present Illness    Jane [name] is a 45 y.o. female with a hx of palpitations, sinus tachycardia, CAD s/p BMS PCi to prox LAD in setting of NSTEMI in 6789, chronic diastolic heart failure, Dm2, obesity, HTN, HLD, ESRD on dialysis last seen 01/06/21 by Dr. [name].  Previous NSTEMI 2017 with BMS to LAD. She had myoview 08/2018 which was low risk with no evidence of ischemia or infarction and normal LVEF.   She as last seen 01/06/21 by Dr. [name]. She had COVID earlier that month with some missed dialysis sessions and was working to get back to a euvolemic status. Due to dyspnea on exertion, echocardiogram was ordered. She had echocardiogram 02/18/21 with normal LVEF 55-60%, no RWMA, mild LVH (mild septal hypertrophy with moderate posterior hypertrophy), RV normal size and function, normal PASP, trivial MR.   Patient contacted the office 07/21/21 noting to have palpitations during dialysis. She takes Metoprolol BID but holds nighttime dose prior to dialysis and takes morning dose 2 hours after dialysis.   She presents today for follow up. Tells me yesterday hemodialysis was interrupted due to nausea, low pressure, and vomiting. Tells me she hasn't quite felt back to herself today. Is planning to rest and eat a bland diet today. No known sick contacts, no fever. Has not been taking Rosuvastatin nor Vascepa as she was not sure she needed. Reports one month history of palpitations at dialysis which feel like heart racing or fluttering. No associated pain nor dyspnea. Not checking BP routinely at home but plans to purchase arm cuff. She lost her mom at the end of April and offered my condolences.   EKGs/Labs/Other  Studies Reviewed:   The following studies were reviewed today:  Echo 02/18/21  1. Mild septal hypertrophy with moderate posterior hypertrophy. Left  ventricular ejection fraction, by estimation, is 55 to 60%. The left  ventricle has normal function. The left ventricle has no regional wall  motion abnormalities. There is mild left  ventricular hypertrophy. Left ventricular diastolic parameters are  indeterminate. The average left ventricular global longitudinal strain is  -21.0 %. The global longitudinal strain is normal.   2. Right ventricular systolic function is normal. The right ventricular  size is normal. There is normal pulmonary artery systolic pressure.   3. The mitral valve is normal in structure. Trivial mitral valve  regurgitation. No evidence of mitral stenosis.   4. The aortic valve is tricuspid. Aortic valve regurgitation is not  visualized. No aortic stenosis is present.   5. The inferior vena cava is normal in size with greater than 50%  respiratory variability,  suggesting right atrial pressure of 3 mmHg.  ____________________________________________  Cardiac catheterization with PCI May 2017:90% mLAD; nl LV Fxn --> PCI - Vision BMS 3.63mm x 18 mm      EKG:  EKG is ordered today.  The ekg ordered today demonstrates NSR 87 bpm with LPFB  Recent Labs: 05/26/2021: ALT 14; B Natriuretic Peptide 167.6; BUN 32; Creatinine, Ser 10.60; Hemoglobin 10.7; Magnesium 2.4; Platelets 241; Potassium 5.0; Sodium 133  Recent Lipid Panel    Component Value Date/Time   CHOL 130 01/27/2021 0807   TRIG 107 01/27/2021 0807   HDL 44 01/27/2021 0807   CHOLHDL 3.0 01/27/2021 0807   CHOLHDL 3.0 03/03/2017 0850   VLDL 19 03/03/2017 0850   LDLCALC 66 01/27/2021 0807     Home Medications   Current Meds  Medication Sig   Accu-Chek Softclix Lancets lancets Check blood sugar up to 3 times  day   acetaminophen (TYLENOL) 500 MG tablet Take 1,000 mg by mouth every 6 (six) hours as needed (for  pain.).   albuterol (PROVENTIL HFA) 108 (90 Base) MCG/ACT inhaler INHALE 2 PUFFS BY MOUTH EVERY 4 HOURS AS NEEDED FOR COUGHING, WHEEZING, OR SHORTNESS OF BREATH (Patient taking differently: Inhale 2 puffs into the lungs every 4 (four) hours as needed for wheezing or shortness of breath. INHALE 2 PUFFS BY MOUTH EVERY 4 HOURS AS NEEDED FOR COUGHING, WHEEZING, OR SHORTNESS OF BREATH)   blood glucose meter kit and supplies Dispense based on patient and insurance preference. Use up to four times daily as directed. (FOR ICD-10 E10.9, E11.9).   Blood Glucose Monitoring Suppl (ACCU-CHEK GUIDE) w/Device KIT 1 each by Does not apply route 3 (three) times daily.   Blood Pressure Monitor MISC USE AS DIRECTED FOUR TIMES DAILY.   Cholecalciferol (VITAMIN D3) 50 MCG (2000 UT) CAPS Take 4 capsules (8,000 Units total) by mouth daily.   Continuous Blood Gluc Receiver (DEXCOM G6 RECEIVER) DEVI Use to check blood sugar at least 6 times a day   Continuous Blood Gluc Sensor (DEXCOM G6 SENSOR) MISC Use to check blood sugar at least 6 times a day   Continuous Blood Gluc Transmit (DEXCOM G6 TRANSMITTER) MISC Use to check blood sugar at least 6 times a day   diclofenac Sodium (VOLTAREN) 1 % GEL APPLY 4 G TOPICALLY 4 (FOUR) TIMES DAILY.   fluticasone (FLONASE) 50 MCG/ACT nasal spray Place 2 sprays into both nostrils every night   gabapentin (NEURONTIN) 100 MG capsule Take 1 capsule (100 mg total) by mouth at bedtime.   glucose blood (ACCU-CHEK GUIDE) test strip Check blood sugar up to 3 times  day   icosapent Ethyl (VASCEPA) 1 g capsule Take 2 capsules (2 g total) by mouth 2 (two) times daily.   injection device for insulin (INPEN 100-PINK-LILLY-HUMALOG) DEVI Use to inject Humalog insulin for meals and correction insulin   insulin degludec (TRESIBA) 100 UNIT/ML FlexTouch Pen Inject 24 Units into the skin daily.   insulin lispro (HUMALOG) 100 UNIT/ML cartridge Use with inpen to inject at meal time and for correction insulin up  to 40 units daily   Insulin Pen Needle 32G X 4 MM MISC Use to inject insulin 4 times a day   Insulin Syringe-Needle U-100 31G X 15/64" 0.3 ML MISC Use to inject insulin daily   metoCLOPramide (REGLAN) 10 MG tablet Take 1 tablet (10 mg total) by mouth every 8 (eight) hours as needed for nausea.   metoprolol tartrate (LOPRESSOR) 25 MG tablet Take 1 tablet (25  mg total) by mouth 2 (two) times daily. Do not take the night before dialysis, and take 2 hours after dialysis.   multivitamin (RENA-VIT) TABS tablet Take 1 tablet by mouth daily.   naloxone (NARCAN) nasal spray 4 mg/0.1 mL Place 1 spray into the nose as needed, if found unresponsive then spray into nose and call 911.   ondansetron (ZOFRAN) 4 MG tablet Take 1 tablet (4 mg total) by mouth every 8 (eight) hours as needed for nausea or vomiting.   oxyCODONE-acetaminophen (PERCOCET) 10-325 MG tablet Take 1 tablet by mouth 5 (five) times daily as needed.   rosuvastatin (CRESTOR) 40 MG tablet Take 1 tablet (40 mg total) by mouth daily.   sertraline (ZOLOFT) 100 MG tablet Take 0.5 tablets (50 mg total) by mouth daily.   sevelamer carbonate (RENVELA) 800 MG tablet Take 3,200 mg by mouth 3 (three) times daily with meals. Take 800 mg with snacks as needed   [DISCONTINUED] amLODipine (NORVASC) 5 MG tablet TAKE 1 TABLET BY MOUTH EVERY NIGHT AS DIRECTED (Patient taking differently: Take 5 mg by mouth daily.)     Review of Systems     All other systems reviewed and are otherwise negative except as noted above.  Physical Exam    VS:  BP 110/62   Pulse 87   Ht $R'5\' 5"'hd$  (1.651 m)   Wt 217 lb 9.6 oz (98.7 kg)   SpO2 97%   BMI 36.21 kg/m  , BMI Body mass index is 36.21 kg/m.  Wt Readings from Last 3 Encounters:  07/27/21 217 lb 9.6 oz (98.7 kg)  06/03/21 222 lb 6.4 oz (100.9 kg)  05/26/21 217 lb (98.4 kg)     GEN: Well nourished, well developed, in no acute distress. HEENT: normal. Neck: Supple, no JVD, carotid bruits, or masses. Cardiac: RRR, no  murmurs, rubs, or gallops. No clubbing, cyanosis, edema.  Radials/PT 2+ and equal bilaterally.  Respiratory:  Respirations regular and unlabored, clear to auscultation bilaterally. GI: Soft, nontender, nondistended. MS: No deformity or atrophy. Skin: Warm and dry, no rash. Neuro:  Strength and sensation are intact. Psych: Normal affect.  Assessment & Plan    Palpitations / Sinus tachycardia - EKG today NSR. 14 day ZIO placed in clinic. Recent labs at HD unremarkable per her report. Not available for my review. Discussed that dehydration, stress contributory to palpitations.  She does not take her metoprolol at this time the evening before dialysis nor the morning of.  She will trial taking 1/2 tablet the night before dialysis, none the morning of dialysis, and taking 25 mg about 2 hours after dialysis.  Have low suspicion that taking her metoprolol 12.5 mg the evening prior to dialysis will contribute to hypotension.  Anticipate her palpitations are PACs or PVCs but plan for ZIO to rule out significant arrhythmia.  CAD s/p BMS PCI to prox LAD - Stable with no anginal symptoms. No indication for ischemic evaluation.  GDMT includes Metoprolol and resumption of Rosuvastatin, Vascepa today. Heart healthy diet and regular cardiovascular exercise encouraged.    Chronic diastolic heart failure - Volume management per HD. No signs of volume overload today. Continue Metoprolol. GDMT limited by ESRD.  HLD, LDL goal <70 - 01/27/21 total cholesterol 130, HDL 44, triglycerides 107, LDL 66. **   DM2 - Continue to follow with PCP.   Obesity - Weight loss via diet and exercise encouraged. Discussed the impact being overweight would have on cardiovascular risk.   HTN - BP well controlled. Continue  current antihypertensive regimen.    ESRD on dialysis -continue to follow with nephrology.  Disposition: Follow up in 4 month(s) with Dr. [name] or APP.  Signed, Loel Dubonnet, NP 07/27/2021, 11:13 AM [place]

## 2021-07-27 ENCOUNTER — Encounter (HOSPITAL_BASED_OUTPATIENT_CLINIC_OR_DEPARTMENT_OTHER): Payer: Self-pay | Admitting: Family

## 2021-07-27 ENCOUNTER — Other Ambulatory Visit (HOSPITAL_COMMUNITY): Payer: Self-pay

## 2021-07-27 ENCOUNTER — Other Ambulatory Visit: Payer: Self-pay

## 2021-07-27 ENCOUNTER — Ambulatory Visit (INDEPENDENT_AMBULATORY_CARE_PROVIDER_SITE_OTHER): Payer: Medicare Other | Admitting: Family

## 2021-07-27 ENCOUNTER — Ambulatory Visit (INDEPENDENT_AMBULATORY_CARE_PROVIDER_SITE_OTHER): Payer: Medicare Other

## 2021-07-27 VITALS — BP 110/62 | HR 87 | Ht 65.0 in | Wt 217.6 lb

## 2021-07-27 DIAGNOSIS — R002 Palpitations: Secondary | ICD-10-CM

## 2021-07-27 DIAGNOSIS — I5032 Chronic diastolic (congestive) heart failure: Secondary | ICD-10-CM

## 2021-07-27 DIAGNOSIS — Z9861 Coronary angioplasty status: Secondary | ICD-10-CM

## 2021-07-27 DIAGNOSIS — E781 Pure hyperglyceridemia: Secondary | ICD-10-CM

## 2021-07-27 DIAGNOSIS — I2510 Atherosclerotic heart disease of native coronary artery without angina pectoris: Secondary | ICD-10-CM

## 2021-07-27 DIAGNOSIS — I10 Essential (primary) hypertension: Secondary | ICD-10-CM

## 2021-07-27 DIAGNOSIS — Z992 Dependence on renal dialysis: Secondary | ICD-10-CM

## 2021-07-27 DIAGNOSIS — N186 End stage renal disease: Secondary | ICD-10-CM

## 2021-07-27 DIAGNOSIS — E785 Hyperlipidemia, unspecified: Secondary | ICD-10-CM

## 2021-07-27 MED ORDER — ICOSAPENT ETHYL 1 G PO CAPS
2.0000 g | ORAL_CAPSULE | Freq: Two times a day (BID) | ORAL | 5 refills | Status: DC
  Filled 2021-07-27: qty 120, 30d supply, fill #0
  Filled 2022-05-13: qty 120, 30d supply, fill #1

## 2021-07-27 MED ORDER — ROSUVASTATIN CALCIUM 40 MG PO TABS
40.0000 mg | ORAL_TABLET | Freq: Every day | ORAL | 3 refills | Status: DC
  Filled 2021-07-27: qty 90, 90d supply, fill #0
  Filled 2022-05-13: qty 90, 90d supply, fill #1

## 2021-07-27 NOTE — Patient Instructions (Addendum)
Medication Instructions:  Your physician has recommended you make the following change in your medication:   Do not resume Amlodipine at this time,   RESUME Rosuvasatin one 40mg  tablet daily  RESUME Vascepa 2 capsules twice daily  For your Metoprolol: Take half tablet in the evening before dialysis  *If you need a refill on your cardiac medications before your next appointment, please call your pharmacy*   Lab Work: None ordered today.    Testing/Procedures: Your EKGtoday showed normal sinus rhythm.   Your physician has recommended that you wear a Zio monitor. Wear for at least 7 days - if you have palpitations okay to remove - if you do not have palpitations during the first 7 days please wear for 14 days.  This monitor is a medical device that records the heart's electrical activity. Doctors most often use these monitors to diagnose arrhythmias. Arrhythmias are problems with the speed or rhythm of the heartbeat. The monitor is a small device applied to your chest. You can wear one while you do your normal daily activities. While wearing this monitor if you have any symptoms to push the button and record what you felt. Once you have worn this monitor for the period of time provider prescribed (Usually 14 days), you will return the monitor device in the postage paid box. Once it is returned they will download the data collected and provide [place] with a report which the provider will then review and we will call you with those results. Important tips:  Avoid showering during the first 24 hours of wearing the monitor. Avoid excessive sweating to help maximize wear time. Do not submerge the device, no hot tubs, and no swimming pools. Keep any lotions or oils away from the patch. After 24 hours you may shower with the patch on. Take brief showers with your back facing the shower head.  Do not remove patch once it has been placed because that will interrupt data and decrease adhesive wear  time. Push the button when you have any symptoms and write down what you were feeling. Once you have completed wearing your monitor, remove and place into box which has postage paid and place in your outgoing mailbox.  If for some reason you have misplaced your box then call our office and we can provide another box and/or mail it off for you.     Follow-Up: At [facility], you and your health needs are our priority.  As part of our continuing mission to provide you with exceptional heart care, we have created designated Provider Care Teams.  These Care Teams include your primary Cardiologist (physician) and Advanced Practice Providers (APPs -  Physician Assistants and Nurse Practitioners) who all work together to provide you with the care you need, when you need it.  We recommend signing up for the patient portal called "MyChart".  Sign up information is provided on this After Visit Summary.  MyChart is used to connect with patients for Virtual Visits (Telemedicine).  Patients are able to view lab/test results, encounter notes, upcoming appointments, etc.  Non-urgent messages can be sent to your provider as well.   To learn more about what you can do with MyChart, go to [email or web address].    Your next appointment:   In January as scheduled with Dr. [name]  Other Instructions  To prevent palpitations: Make sure you are adequately hydrated.  Avoid and/or limit caffeine containing beverages like soda or tea. Exercise regularly.  Manage stress well. Some over the  counter medications can cause palpitations such as Benadryl, AdvilPM, TylenolPM. Regular Advil or Tylenol do not cause palpitations.     Bland Diet A bland diet consists of foods that are often soft and do not have a lot of fat, fiber, or extra seasonings. Foods without fat, fiber, or seasoning are easier for the body to digest. They are also less likely to irritate your mouth, throat, stomach, and other parts of your  digestive system. A bland diet is sometimes called a BRAT diet. What is my plan? Your health care provider or food and nutrition specialist (dietitian) may recommend specific changes to your diet to prevent symptoms or to treat your symptoms. These changes may include: Eating small meals often. Cooking food until it is soft enough to chew easily. Chewing your food well. Drinking fluids slowly. Not eating foods that are very spicy, sour, or fatty. Not eating citrus fruits, such as oranges and grapefruit. What do I need to know about this diet? Eat a variety of foods from the bland diet food list. Do not follow a bland diet longer than needed. Ask your health care provider whether you should take vitamins or supplements. What foods can I eat? Grains Hot cereals, such as cream of wheat. Rice. Bread, crackers, or tortillas made from refined white flour. Vegetables Canned or cooked vegetables. Mashed or boiled potatoes. Fruits Bananas. Applesauce. Other types of cooked or canned fruit with the skin and seeds removed, such as canned peaches or pears. Meats and other proteins Scrambled eggs. Creamy peanut butter or other nut butters. Lean, well-cooked meats, such as chicken or fish. Tofu. Soups or broths. Dairy Low-fat dairy products, such as milk, cottage cheese, or yogurt. Beverages Water. Herbal tea. Apple juice. Fats and oils Mild salad dressings. Canola or olive oil. Sweets and desserts Pudding. Custard. Fruit gelatin. Ice cream. The items listed above may not be a complete list of recommended foods and beverages. Contact a dietitian for more options. What foods are not recommended? Grains Whole grain breads and cereals. Vegetables Raw vegetables. Fruits Raw fruits, especially citrus, berries, or dried fruits. Dairy Whole fat dairy foods. Beverages Caffeinated drinks. Alcohol. Seasonings and condiments Strongly flavored seasonings or condiments. Hot sauce. Salsa. Other  foods Spicy foods. Fried foods. Sour foods, such as pickled or fermented foods. Foods with high sugar content. Foods high in fiber. The items listed above may not be a complete list of foods and beverages to avoid. Contact a dietitian for more information. Summary A bland diet consists of foods that are often soft and do not have a lot of fat, fiber, or extra seasonings. Foods without fat, fiber, or seasoning are easier for the body to digest. Check with your health care provider to see how long you should follow this diet plan. It is not meant to be followed for long periods. This information is not intended to replace advice given to you by your health care provider. Make sure you discuss any questions you have with your health care provider. Document Revised: 11/29/2017 Document Reviewed: 11/29/2017 Elsevier Patient Education  2022 [employer].

## 2021-07-28 ENCOUNTER — Other Ambulatory Visit (HOSPITAL_COMMUNITY): Payer: Self-pay

## 2021-07-29 ENCOUNTER — Telehealth: Payer: Self-pay | Admitting: Internal Medicine

## 2021-07-29 ENCOUNTER — Encounter: Payer: Self-pay | Admitting: Internal Medicine

## 2021-07-29 ENCOUNTER — Other Ambulatory Visit (HOSPITAL_COMMUNITY): Payer: Self-pay

## 2021-07-29 ENCOUNTER — Other Ambulatory Visit: Payer: Self-pay | Admitting: Internal Medicine

## 2021-07-29 MED ORDER — METOCLOPRAMIDE HCL 10 MG PO TABS
10.0000 mg | ORAL_TABLET | Freq: Three times a day (TID) | ORAL | 0 refills | Status: DC | PRN
  Filled 2021-07-29: qty 10, 4d supply, fill #0

## 2021-07-30 ENCOUNTER — Ambulatory Visit (INDEPENDENT_AMBULATORY_CARE_PROVIDER_SITE_OTHER): Payer: Medicare Other | Admitting: Internal Medicine

## 2021-07-30 ENCOUNTER — Encounter: Payer: Self-pay | Admitting: Internal Medicine

## 2021-07-30 ENCOUNTER — Other Ambulatory Visit (HOSPITAL_COMMUNITY): Payer: Self-pay

## 2021-07-30 ENCOUNTER — Other Ambulatory Visit: Payer: Self-pay

## 2021-07-30 ENCOUNTER — Telehealth: Payer: Self-pay | Admitting: Family

## 2021-07-30 DIAGNOSIS — R059 Cough, unspecified: Secondary | ICD-10-CM | POA: Diagnosis not present

## 2021-07-30 DIAGNOSIS — R0989 Other specified symptoms and signs involving the circulatory and respiratory systems: Secondary | ICD-10-CM

## 2021-07-30 MED ORDER — CETIRIZINE HCL 5 MG PO TABS
5.0000 mg | ORAL_TABLET | Freq: Every day | ORAL | 0 refills | Status: DC
  Filled 2021-07-30: qty 30, 30d supply, fill #0

## 2021-07-30 MED ORDER — GUAIFENESIN ER 600 MG PO TB12
600.0000 mg | ORAL_TABLET | Freq: Two times a day (BID) | ORAL | 0 refills | Status: AC
  Filled 2021-07-30: qty 20, 10d supply, fill #0

## 2021-07-30 NOTE — Telephone Encounter (Signed)
This Rx was sent to [facility] yesterday. Also, PCP sent patient a MyChart message reminding her not to take the med for 3 days prior to procedure on 10/4. Patient made aware that Rx was sent and PCP's instructions. She was very Patent attorney.

## 2021-07-30 NOTE — Telephone Encounter (Signed)
metoCLOPramide (REGLAN) 10 MG tablet, REFILL REQUEST @ [facility].

## 2021-07-30 NOTE — Progress Notes (Signed)
  [name] Internal Medicine Residency Telephone Encounter Continuity Care Appointment  HPI:  This telephone encounter was created for [name] on 07/30/2021 for the following purpose/cc chest congestion cough.   Past Medical History:  Past Medical History:  Diagnosis Date  . Abnormal uterine bleeding (AUB)   . Asthma    prn inhaler  . CAD S/P BMS PCI to prox LAD cardiologist--- dr Beverlyn Roux LAD to Mid LAD lesion, 90% stenosed. Post intervention - Vision BMS 3.0 mm x 18 mm (~3.5 mm) there is a 0% residual stenosis. ;   nuclear stress test-- 09-13-2018 low risk with no ischemia, nuclear ef 56%  . Charcot foot due to diabetes mellitus ([place]) 11/2016   left 2018; right 2019  . Depression   . Diabetic foot ulcer ([place]) 04/22/2019  . Diabetic neuropathy (HCC)    feet  . Diabetic retinopathy, nonproliferative, severe ([place])    bilateral  . ESRD (end stage renal disease) on dialysis [facility])    ?chronic interstitial nephritis  [facility]  . H/O non-ST elevation myocardial infarction (NSTEMI) 03/2016   Found in 90% mid LAD lesion treated with bare-metal stent (BMS) PCI - vision BMS 3.0 mm x 18 mm  . History of MRSA infection 2007  . Hyperlipidemia   . Hypertension   . IDA (iron deficiency anemia)   . Insulin dependent type 2 diabetes mellitus ([place])   . Iron deficiency anemia    takes iron supplement  . 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 PAOD (peripheral arterial occlusive disease) ([place])    vascular--- dr Bridgett Larsson  . S/P arteriovenous (AV) fistula creation 07/2017  . S/p bare metal coronary artery stent 03/31/2016   BMS x1  to pLAD  . Sickle cell trait ([place])   . Ulcer of left foot due to type 2 diabetes mellitus ([place]) 06/03/2019     ROS:  Review of Systems  Constitutional:  Negative for chills, diaphoresis, fever and malaise/fatigue.  HENT:  Negative for congestion and sinus pain.   Eyes:  Positive for redness.  Negative for blurred vision, double vision, photophobia, pain and discharge.       + Watery eyes  Gastrointestinal:  Negative for abdominal pain, constipation, diarrhea, nausea and vomiting.    Assessment / Plan / Recommendations:  Please see A&P under problem oriented charting for assessment of the patient's acute and chronic medical conditions.  As always, pt is advised that if symptoms worsen or new symptoms arise, they should go to an urgent care facility or to to ER for further evaluation.   Consent and Medical Decision Making:  Patient discussed with Dr. Jimmye Norman This is a telephone encounter between [facility] and [name] on 07/30/2021 for chest congestion and cough. The visit was conducted with the patient located at home and [name] at [facility]. The patient's identity was confirmed using their DOB and current address. The patient has consented to being evaluated through a telephone encounter and understands the associated risks (an examination cannot be done and the patient may need to come in for an appointment) / benefits (allows the patient to remain at home, decreasing exposure to coronavirus). I personally spent 12 minutes on medical discussion.

## 2021-07-30 NOTE — Telephone Encounter (Signed)
Pt has been wearing a Zio Monitor since 07/27/21, once she got out of the shower today the adhesive started peeling off. Pt wants to know how she should proceed. Please advise pt further

## 2021-07-30 NOTE — Assessment & Plan Note (Signed)
Patient presents for telehealth visit for dry cough and congestion. She states that her symptoms have been present throughout the week. She did have 1 episode of vomiting and fatigue, but she attributes to her dialysis. Additionally she endorses watery itchy eyes. The patient denies any fevers, myalgias, abdominal pain.  She states that she has contracted COVID-19 in the past (December 14, 2020), and her symptoms do not feel like her prior COVID infection.  She states that she does have seasonal allergies that appear this time of year, and was recently seen by ENT who gave her Flonase 2 squirts per nare which she takes daily.  She does endorse going to church, and has had 1 family member that was COVID-positive earlier this month.  She states that she is planning on getting a COVID test today.  Assessment: Patient presents with concern of chest congestion, cough, and watery itchy eyes.  She denies any fevers, myalgias, abdominal pain, nausea, diarrhea, or loss of taste or smell.  While her symptoms appear to be more along the lines of allergies, given positive family member exposure plus being exposed to large groups we will continue with getting a COVID test. In the interim, we will treat with Mucinex and start patient on Zyrtec for continued allergy relief. - Mucinex 600 mg twice daily - Zyrtec 5 mg daily - Continue Flonase

## 2021-07-31 ENCOUNTER — Encounter (HOSPITAL_BASED_OUTPATIENT_CLINIC_OR_DEPARTMENT_OTHER): Payer: Self-pay

## 2021-08-02 NOTE — Progress Notes (Signed)
Internal Medicine Clinic Attending ? ?Case discussed with Dr. Winters  At the time of the visit.  We reviewed the resident?s history and exam and pertinent patient test results.  I agree with the assessment, diagnosis, and plan of care documented in the resident?s note.  ?

## 2021-08-02 NOTE — Telephone Encounter (Signed)
Patient to mail monitor back to [facility] for processing 4-5 days of data per Terie Purser, NP.

## 2021-08-05 ENCOUNTER — Ambulatory Visit: Payer: Medicare Other

## 2021-08-05 NOTE — Patient Instructions (Signed)
Visit Information   Goals Addressed             This Visit's Progress    Blood Pressure < 140/90       BP Readings from Last 3 Encounters:  07/27/21 110/62  06/17/21 (!) 171/89  06/03/21 115/66          HEMOGLOBIN A1C < 7       Lab Results  Component Value Date   HGBA1C 6.8 (A) 04/01/2021   HGBA1C 9.0 (A) 01/07/2021   HGBA1C 9.5 (A) 01/29/2020   Lab Results  Component Value Date   MICROALBUR 40.6 (H) 08/05/2014   LDLCALC 66 01/27/2021   CREATININE 10.60 (H) 05/26/2021   Not meeting Hgb A1C goals- discussed strategies to improve diabetes management      LDL CALC < 100       Lab Results  Component Value Date   CHOL 130 01/27/2021   HDL 44 01/27/2021   LDLCALC 66 01/27/2021   TRIG 107 01/27/2021   CHOLHDL 3.0 01/27/2021   Encouraged patient to have lipid panel drawn ,that were ordered 06/22/20,when she has her preoperative labs      Make and Keep All Appointments       Timeframe:  Long-Range Goal Priority:  High Start Date:         10/30/20                    Expected End Date:    ongoing                 Follow Up Date 09/12/21   - ask family or friend for a ride - call to cancel if needed - keep a calendar with appointment dates    Why is this important?   Part of staying healthy is seeing the doctor for follow-up care.  If you forget your appointments, there are some things you can do to stay on track.    Notes:         The patient verbalized understanding of instructions, educational materials, and care plan provided today and declined offer to receive copy of patient instructions, educational materials, and care plan.   Telephone follow up appointment with care management team member scheduled for:08/26/21@1 :[place], RN, BSN, CCM Care Management Coordinator [facility] Internal Medicine Phone: [phone] / Fax: [phone]

## 2021-08-05 NOTE — Chronic Care Management (AMB) (Signed)
Care Management    RN Visit Note  08/05/2021 Name: NASYA VINCENT MRN: [id number] DOB: December 21, 1975  Subjective: [name] is a 45 y.o. year old female who is a primary care patient of [name], MD. The care management team was consulted for assistance with disease management and care coordination needs.    Engaged with patient by telephone for follow up visit in response to provider referral for case management and/or care coordination services.   Consent to Services:   Ms. Daus was given information about Care Management services today including:  Care Management services includes personalized support from designated clinical staff supervised by her physician, including individualized plan of care and coordination with other care providers 24/7 contact phone numbers for assistance for urgent and routine care needs. The patient may stop case management services at any time by phone call to the office staff.  Patient agreed to services and consent obtained.   Assessment: Review of patient past medical history, allergies, medications, health status, including review of consultants reports, laboratory and other test data, was performed as part of comprehensive evaluation and provision of chronic care management services.   SDOH (Social Determinants of Health) assessments and interventions performed:    Care Plan  Allergies  Allergen Reactions   Adhesive [Tape] Other (See Comments)    Irritation     Outpatient Encounter Medications as of 08/05/2021  Medication Sig Note   Accu-Chek Softclix Lancets lancets Check blood sugar up to 3 times  day    acetaminophen (TYLENOL) 500 MG tablet Take 1,000 mg by mouth every 6 (six) hours as needed (for pain.).    albuterol (PROVENTIL HFA) 108 (90 Base) MCG/ACT inhaler INHALE 2 PUFFS BY MOUTH EVERY 4 HOURS AS NEEDED FOR COUGHING, WHEEZING, OR SHORTNESS OF BREATH (Patient taking differently: Inhale 2 puffs into the lungs  every 4 (four) hours as needed for wheezing or shortness of breath. INHALE 2 PUFFS BY MOUTH EVERY 4 HOURS AS NEEDED FOR COUGHING, WHEEZING, OR SHORTNESS OF BREATH)    blood glucose meter kit and supplies Dispense based on patient and insurance preference. Use up to four times daily as directed. (FOR ICD-10 E10.9, E11.9).    Blood Glucose Monitoring Suppl (ACCU-CHEK GUIDE) w/Device KIT 1 each by Does not apply route 3 (three) times daily.    Blood Pressure Monitor MISC USE AS DIRECTED FOUR TIMES DAILY.    cetirizine (ZYRTEC) 5 MG tablet Take 1 tablet (5 mg total) by mouth daily.    Cholecalciferol (VITAMIN D3) 50 MCG (2000 UT) CAPS Take 4 capsules (8,000 Units total) by mouth daily.    Continuous Blood Gluc Receiver (DEXCOM G6 RECEIVER) DEVI Use to check blood sugar at least 6 times a day    Continuous Blood Gluc Sensor (DEXCOM G6 SENSOR) MISC Use to check blood sugar at least 6 times a day    Continuous Blood Gluc Transmit (DEXCOM G6 TRANSMITTER) MISC Use to check blood sugar at least 6 times a day    diclofenac Sodium (VOLTAREN) 1 % GEL APPLY 4 G TOPICALLY 4 (FOUR) TIMES DAILY.    fluticasone (FLONASE) 50 MCG/ACT nasal spray Place 2 sprays into both nostrils every night    gabapentin (NEURONTIN) 100 MG capsule Take 1 capsule (100 mg total) by mouth at bedtime.    glucose blood (ACCU-CHEK GUIDE) test strip Check blood sugar up to 3 times  day    guaiFENesin (MUCINEX) 600 MG 12 hr tablet Take 1 tablet (600 mg total) by  mouth 2 (two) times daily for 10 days.    icosapent Ethyl (VASCEPA) 1 g capsule Take 2 capsules (2 g total) by mouth 2 (two) times daily.    injection device for insulin (INPEN 100-PINK-LILLY-HUMALOG) DEVI Use to inject Humalog insulin for meals and correction insulin    insulin degludec (TRESIBA) 100 UNIT/ML FlexTouch Pen Inject 24 Units into the skin daily.    insulin lispro (HUMALOG) 100 UNIT/ML cartridge Use with inpen to inject at meal time and for correction insulin up to 40  units daily    Insulin Pen Needle 32G X 4 MM MISC Use to inject insulin 4 times a day    Insulin Syringe-Needle U-100 31G X 15/64" 0.3 ML MISC Use to inject insulin daily    metoCLOPramide (REGLAN) 10 MG tablet Take 1 tablet (10 mg total) by mouth every 8 (eight) hours as needed for nausea.    metoprolol tartrate (LOPRESSOR) 25 MG tablet Take 1 tablet (25 mg total) by mouth 2 (two) times daily. Do not take the night before dialysis, and take 2 hours after dialysis.    multivitamin (RENA-VIT) TABS tablet Take 1 tablet by mouth daily.    ondansetron (ZOFRAN) 4 MG tablet Take 1 tablet (4 mg total) by mouth every 8 (eight) hours as needed for nausea or vomiting.    oxyCODONE-acetaminophen (PERCOCET) 10-325 MG tablet Take 1 tablet by mouth 5 (five) times daily as needed.    rosuvastatin (CRESTOR) 40 MG tablet Take 1 tablet (40 mg total) by mouth daily.    sertraline (ZOLOFT) 100 MG tablet Take 0.5 tablets (50 mg total) by mouth daily.    sevelamer carbonate (RENVELA) 800 MG tablet Take 3,200 mg by mouth 3 (three) times daily with meals. Take 800 mg with snacks as needed    No facility-administered encounter medications on file as of 08/05/2021.    Patient Active Problem List   Diagnosis Date Noted   Chest congestion 07/30/2021   Intractable nausea and vomiting 06/03/2021   Grief 04/01/2021   Diabetic neuropathy ([place]) 01/29/2021   Neck pain 01/07/2021   Hypertensive heart disease with chronic diastolic congestive heart failure ([place]) 01/06/2021   Left lateral abdominal pain 02/02/2020   Arthritis of left knee 02/11/2019   Hidradenitis suppurativa 02/12/2018   Anemia in chronic kidney disease 02/12/2018   Secondary hyperparathyroidism of renal origin ([place]) 01/30/2018   Coagulation defect, unspecified ([place]) 01/18/2018   Major depressive disorder 08/11/2017   Sinus tachycardia 02/23/2017   ESRD (end stage renal disease) on dialysis ([place]) 01/05/2017   CAD S/P BMS PCI to prox LAD 03/31/2016    Abnormal uterine bleeding (AUB) 03/17/2016   PAOD (peripheral arterial occlusive disease) ([place]) 01/08/2016   Charcot foot due to diabetes mellitus ([place]) 09/07/2015   Peripheral neuropathy 09/20/2014   Essential hypertension 08/16/2014   Seasonal allergic rhinitis 08/16/2014   Candidal vulvovaginitis 02/09/2014   Morbid obesity ([place]) 09/26/2012   Hyperlipidemia associated with type 2 diabetes mellitus ([place]) 02/26/2009   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, with long-term current use of insulin ([place]) 05/08/2007   Asthma 05/08/2007    Conditions to be addressed/monitored: DMII and ESRD  Care Plan : CCM RN Diabetes Type 2 (Adult)  Updates made by Johnney Killian, RN since 08/05/2021 12:00 AM     Problem: Disease Progression (Diabetes, Type 2)      Goal: Disease Progression Prevented or Minimized   Start Date: 01/31/2020  Recent Progress: On track  Priority: Low  Note:  CARE PLAN ENTRY (see longitudinal plan of care for additional care plan information)  Current Barriers:  Chronic Disease Management support, education, and care coordination needs related to CAD, HTN, HLD, DMII, and ESRD- Successful call to patient this afternoon.  Patient has been suffering from Covid infection since last week and when this RNCM called she was going to lay down.  Patient did not go for dialysis yesterday since she was not feeling good and had a really bad headache.  Patient is going to take another covid test in the morning as she needs to go for ESRD.  Patient says she does feel better this week than she did last week.  Clinical Goal(s) related to CAD, HTN, HLD, DMII, and ESRD:  Over the next 30-60 days, patient will:  Work with the care management team to address educational, disease management, and care coordination needs  Begin or continue self health monitoring activities as directed today Call provider office for new or worsened signs and symptoms New or  worsened symptom related to DM, HTN, CAD, ESRD Call care management team with questions or concerns Verbalize basic understanding of patient centered plan of care established today  Interventions related to CAD, HTN, HLD, DMII, and ESRD:  Prior to interviewing patient, reviewed patient status, including review of recent office visit notes, consultants reports, relevant laboratory and other test results, and medications, completed. Provided patient with the opportunity to discuss the death of her mother and emotional support provided Assessed patient's self management skills in regards to HTN, IDDM, HLD, CAD and ESRD  Evaluation of current treatment plan related to HTN and IDDM, HLD and ESRD and patient's adherence to plan as established by provider.  Reviewed medications with patient and assessed medication taking behavior Assessed patient's satisfaction with CGM Rx and reviewed episodes of hypoglycemia and hyperglycemia Reviewed how to treat hypoglycemia and encouraged patient to carry sources of quick sugar with her at all times especially when at dialysis center  Much positive reinforcement given to patient for taking charge of her health issues and using the tools and her knowledge to improve her health despite the deaths of close friends and family in the last year- Patient is attending grief counseling weekly and she finds it helpful for someone just to listen. Reviewed upcoming appointments including: cardiology appointment on 8/25  Patient Self Care Activities related to CAD, HTN, HLD, DMII, and ESRD:  Patient is unable to independently self-manage chronic health conditions      Plan: Telephone follow up appointment with care management team member scheduled for:  76 days  Johnney Killian, RN, BSN, CCM Care Management Coordinator [facility] Internal Medicine Phone: [phone] / Fax: [phone]

## 2021-08-10 ENCOUNTER — Other Ambulatory Visit: Payer: Self-pay

## 2021-08-10 ENCOUNTER — Ambulatory Visit
Admission: RE | Admit: 2021-08-10 | Discharge: 2021-08-10 | Disposition: A | Discharge status: Home / Self Care | Payer: Medicare Other | Source: Ambulatory Visit | Attending: Obstetrics & Gynecology | Admitting: Obstetrics & Gynecology

## 2021-08-10 DIAGNOSIS — Z1231 Encounter for screening mammogram for malignant neoplasm of breast: Secondary | ICD-10-CM

## 2021-08-11 ENCOUNTER — Other Ambulatory Visit (HOSPITAL_COMMUNITY): Payer: Self-pay

## 2021-08-11 MED ORDER — LANTHANUM CARBONATE 1000 MG PO CHEW
1000.0000 mg | CHEWABLE_TABLET | Freq: Three times a day (TID) | ORAL | 4 refills | Status: AC
  Filled 2021-08-11: qty 90, 30d supply, fill #0
  Filled 2022-05-13: qty 90, 30d supply, fill #1

## 2021-08-12 ENCOUNTER — Other Ambulatory Visit (HOSPITAL_COMMUNITY): Payer: Self-pay

## 2021-08-14 ENCOUNTER — Encounter (HOSPITAL_COMMUNITY): Payer: Self-pay

## 2021-08-16 ENCOUNTER — Other Ambulatory Visit (HOSPITAL_COMMUNITY): Payer: Self-pay

## 2021-08-17 ENCOUNTER — Other Ambulatory Visit (HOSPITAL_COMMUNITY): Payer: Self-pay

## 2021-08-17 ENCOUNTER — Other Ambulatory Visit: Payer: Self-pay

## 2021-08-17 ENCOUNTER — Ambulatory Visit (HOSPITAL_COMMUNITY)
Admission: RE | Admit: 2021-08-17 | Discharge: 2021-08-17 | Disposition: A | Discharge status: Home / Self Care | Payer: 59 | Source: Ambulatory Visit | Attending: Internal Medicine | Admitting: Internal Medicine

## 2021-08-17 DIAGNOSIS — R112 Nausea with vomiting, unspecified: Secondary | ICD-10-CM | POA: Diagnosis present

## 2021-08-17 MED ORDER — TECHNETIUM TC 99M SULFUR COLLOID
2.0000 | Freq: Once | INTRAVENOUS | Status: AC | PRN
  Administered 2021-08-17: 2 via INTRAVENOUS

## 2021-08-18 ENCOUNTER — Telehealth: Payer: Self-pay | Admitting: Dietician

## 2021-08-18 ENCOUNTER — Ambulatory Visit: Payer: 59 | Admitting: Obstetrics & Gynecology

## 2021-08-18 NOTE — Telephone Encounter (Signed)
Called to follow up with patient about using the Inpen. She scheduled for next week to set it up.

## 2021-08-26 ENCOUNTER — Other Ambulatory Visit (HOSPITAL_COMMUNITY): Payer: Self-pay

## 2021-08-26 ENCOUNTER — Encounter (HOSPITAL_COMMUNITY): Payer: Self-pay

## 2021-08-26 ENCOUNTER — Ambulatory Visit: Payer: Medicare Other

## 2021-08-26 ENCOUNTER — Ambulatory Visit (INDEPENDENT_AMBULATORY_CARE_PROVIDER_SITE_OTHER): Payer: 59 | Admitting: Dietician

## 2021-08-26 ENCOUNTER — Encounter: Payer: Self-pay | Admitting: Dietician

## 2021-08-26 DIAGNOSIS — E113413 Type 2 diabetes mellitus with severe nonproliferative diabetic retinopathy with macular edema, bilateral: Secondary | ICD-10-CM

## 2021-08-26 DIAGNOSIS — Z794 Long term (current) use of insulin: Secondary | ICD-10-CM

## 2021-08-26 DIAGNOSIS — Z713 Dietary counseling and surveillance: Secondary | ICD-10-CM

## 2021-08-26 MED ORDER — OXYCODONE-ACETAMINOPHEN 10-325 MG PO TABS
1.0000 | ORAL_TABLET | Freq: Every day | ORAL | 0 refills | Status: DC | PRN
  Filled 2021-08-26: qty 150, 30d supply, fill #0

## 2021-08-26 NOTE — Progress Notes (Signed)
Diabetes Self-Management Education  Visit Type: Follow-up (2nd annual fu this year)  Appt. Start Time: 930 Appt. End Time: 1000  08/26/2021  Jane [name], identified by name and date of birth, is a 45 y.o. female with a diagnosis of Diabetes:  .   ASSESSMENT  Jane [name] had another appointment today. Well follow up to complete her assessment and follow up assessment of her goals.   BP Readings from Last 3 Encounters:  07/27/21 110/62  06/17/21 (!) 171/89  06/03/21 115/66   Lab Results  Component Value Date   HGBA1C 6.8 (A) 04/01/2021   HGBA1C 9.0 (A) 01/07/2021   HGBA1C 9.5 (A) 01/29/2020   HGBA1C 8.2 (A) 11/29/2018   HGBA1C 9.1 (A) 08/01/2018    Estimated body mass index is 36.21 kg/m as calculated from the following:   Height as of 07/27/21: 5\' 5"  (1.651 m).   Weight as of 07/27/21: 217 lb 9.6 oz (98.7 kg). Wt Readings from Last 10 Encounters:  07/27/21 217 lb 9.6 oz (98.7 kg)  06/03/21 222 lb 6.4 oz (100.9 kg)  05/26/21 217 lb (98.4 kg)  04/19/21 222 lb (100.7 kg)  04/13/21 225 lb (102.1 kg)  04/06/21 229 lb 12.8 oz (104.2 kg)  04/01/21 228 lb 3.2 oz (103.5 kg)  03/01/21 228 lb 6.4 oz (103.6 kg)  01/29/21 220 lb 1.6 oz (99.8 kg)  01/28/21 220 lb (99.8 kg)      Diabetes Self-Management Education - 08/26/21 1500       Visit Information   Visit Type Follow-up   2nd annual fu this year     Health Coping   How would you rate your overall health? Good      Psychosocial Assessment   Patient Belief/Attitude about Diabetes Motivated to manage diabetes    Self-care barriers Lack of material resources    Self-management support Family;CDE visits;Doctor's office;Friends    Patient Concerns Medication    Special Needs None    Preferred Learning Style Hands on    Learning Readiness Ready    How often do you need to have someone help you when you read instructions, pamphlets, or other written materials from your doctor or pharmacy? 3 - Sometimes    What is the  last grade level you completed in school? 12      Complications   Last HgB A1C per patient/outside source 6.8 %   this may be low due to anemia and dialysis   How often do you check your blood sugar? > 4 times/day    Fasting Blood glucose range (mg/dL) --   not discussed today   Postprandial Blood glucose range (mg/dL) --   not discussed today   Number of hypoglycemic episodes per month --   not discussed today   Number of hyperglycemic episodes per week 5    Can you tell when your blood sugar is high? --   not discussed today   Have you had a dilated eye exam in the past 12 months? Yes    Have you had a dental exam in the past 12 months? Yes    Are you checking your feet? Yes    How many days per week are you checking your feet? 7      Dietary Intake   Breakfast not discussed today      Exercise   Exercise Type --   not discussed today     Patient Education   Previous Diabetes Education Yes (please comment)    Medications  Other (comment)   assisted her in seting up her Inpen. fu scheuled to be sure she understands how to use it     Individualized Goals (developed by patient)   Medications take my medication as prescribed      Post-Education Assessment   Patient understands incorporating nutritional management into lifestyle. Demonstrates understanding / competency    Patient understands using medications safely. Demonstrates understanding / competency    Patient understands prevention, detection, and treatment of acute complications. Demonstrates understanding / competency      Outcomes   Expected Outcomes Demonstrated interest in learning. Expect positive outcomes    Future DMSE 2 wks    Program Status Completed      Subsequent Visit   Since your last visit have you continued or begun to take your medications as prescribed? Yes    Since your last visit have you had your blood pressure checked? Yes    Is your most recent blood pressure lower, unchanged, or higher since your  last visit? Unchanged    Since your last visit have you experienced any weight changes? No change    Since your last visit, are you checking your blood glucose at least once a day? Yes   now using [employer] CGM, but does not have it on today            Individualized Plan for Diabetes Self-Management Training:   Learning Objective:  Patient will have a greater understanding of diabetes self-management. Patient education plan is to attend individual and/or group sessions per assessed needs and concerns.   Plan:   There are no Patient Instructions on file for this visit.  Expected Outcomes:  Demonstrated interest in learning. Expect positive outcomes  Education material provided: Diabetes Resources  If problems or questions, patient to contact team via:  Phone  Future DSME appointment: 2 wks Debera Lat, RD 08/26/2021 3:18 PM.

## 2021-08-26 NOTE — Patient Instructions (Signed)
Visit Information   Goals Addressed             This Visit's Progress    Make and Keep All Appointments       Timeframe:  Long-Range Goal Priority:  High Start Date:         10/30/20                    Expected End Date:    ongoing                 Follow Up Date 10/13/21   - ask family or friend for a ride - call to cancel if needed - keep a calendar with appointment dates    Why is this important?   Part of staying healthy is seeing the doctor for follow-up care.  If you forget your appointments, there are some things you can do to stay on track.    Notes: Patient missed ESRD on 08/25/21 due to illness        The patient verbalized understanding of instructions, educational materials, and care plan provided today and declined offer to receive copy of patient instructions, educational materials, and care plan.   Telephone follow up appointment with care management team member scheduled for: After phone visit response  Johnney Killian, RN, BSN, CCM Care Management Coordinator [facility] Internal Medicine Phone: [phone] / Fax: [phone]

## 2021-08-26 NOTE — Chronic Care Management (AMB) (Addendum)
Care Management    RN Visit Note  08/26/2021 Name: Jane [name] MRN: [id number] DOB: 09-Apr-1976  Subjective: Jane [name] is a 45 y.o. year old female who is a primary care patient of [name], MD. The care management team was consulted for assistance with disease management and care coordination needs.    Engaged with patient by telephone for follow up visit in response to provider referral for case management and/or care coordination services.   Consent to Services:   [name] was given information about Care Management services today including:  Care Management services includes personalized support from designated clinical staff supervised by her physician, including individualized plan of care and coordination with other care providers 24/7 contact phone numbers for assistance for urgent and routine care needs. The patient may stop case management services at any time by phone call to the office staff.  Patient agreed to services and consent obtained.   Assessment: Review of patient past medical history, allergies, medications, health status, including review of consultants reports, laboratory and other test data, was performed as part of comprehensive evaluation and provision of chronic care management services.   SDOH (Social Determinants of Health) assessments and interventions performed:    Care Plan  Allergies  Allergen Reactions   Adhesive [Tape] Other (See Comments)    Irritation     Outpatient Encounter Medications as of 08/26/2021  Medication Sig Note   Accu-Chek Softclix Lancets lancets Check blood sugar up to 3 times  day    acetaminophen (TYLENOL) 500 MG tablet Take 1,000 mg by mouth every 6 (six) hours as needed (for pain.).    albuterol (PROVENTIL HFA) 108 (90 Base) MCG/ACT inhaler INHALE 2 PUFFS BY MOUTH EVERY 4 HOURS AS NEEDED FOR COUGHING, WHEEZING, OR SHORTNESS OF BREATH (Patient taking differently: Inhale 2 puffs into the lungs  every 4 (four) hours as needed for wheezing or shortness of breath. INHALE 2 PUFFS BY MOUTH EVERY 4 HOURS AS NEEDED FOR COUGHING, WHEEZING, OR SHORTNESS OF BREATH)    blood glucose meter kit and supplies Dispense based on patient and insurance preference. Use up to four times daily as directed. (FOR ICD-10 E10.9, E11.9).    Blood Glucose Monitoring Suppl (ACCU-CHEK GUIDE) w/Device KIT 1 each by Does not apply route 3 (three) times daily.    Blood Pressure Monitor MISC USE AS DIRECTED FOUR TIMES DAILY.    cetirizine (ZYRTEC) 5 MG tablet Take 1 tablet (5 mg total) by mouth daily.    Cholecalciferol (VITAMIN D3) 50 MCG (2000 UT) CAPS Take 4 capsules (8,000 Units total) by mouth daily.    Continuous Blood Gluc Receiver (DEXCOM G6 RECEIVER) DEVI Use to check blood sugar at least 6 times a day    Continuous Blood Gluc Sensor (DEXCOM G6 SENSOR) MISC Use to check blood sugar at least 6 times a day    Continuous Blood Gluc Transmit (DEXCOM G6 TRANSMITTER) MISC Use to check blood sugar at least 6 times a day    diclofenac Sodium (VOLTAREN) 1 % GEL APPLY 4 G TOPICALLY 4 (FOUR) TIMES DAILY.    fluticasone (FLONASE) 50 MCG/ACT nasal spray Place 2 sprays into both nostrils every night    gabapentin (NEURONTIN) 100 MG capsule Take 1 capsule (100 mg total) by mouth at bedtime.    glucose blood (ACCU-CHEK GUIDE) test strip Check blood sugar up to 3 times  day    icosapent Ethyl (VASCEPA) 1 g capsule Take 2 capsules (2 g total) by mouth  2 (two) times daily.    injection device for insulin (INPEN 100-PINK-LILLY-HUMALOG) DEVI Use to inject Humalog insulin for meals and correction insulin    insulin degludec (TRESIBA) 100 UNIT/ML FlexTouch Pen Inject 24 Units into the skin daily.    insulin lispro (HUMALOG) 100 UNIT/ML cartridge Use with inpen to inject at meal time and for correction insulin up to 40 units daily    Insulin Pen Needle 32G X 4 MM MISC Use to inject insulin 4 times a day    Insulin Syringe-Needle U-100  31G X 15/64" 0.3 ML MISC Use to inject insulin daily    lanthanum (FOSRENOL) 1000 MG chewable tablet Chew 1 tablet (1,000 mg total) by mouth 3 (three) times daily with meals.    metoCLOPramide (REGLAN) 10 MG tablet Take 1 tablet (10 mg total) by mouth every 8 (eight) hours as needed for nausea.    metoprolol tartrate (LOPRESSOR) 25 MG tablet Take 1 tablet (25 mg total) by mouth 2 (two) times daily. Do not take the night before dialysis, and take 2 hours after dialysis.    multivitamin (RENA-VIT) TABS tablet Take 1 tablet by mouth daily.    naloxone (NARCAN) nasal spray 4 mg/0.1 mL Place 1 spray into the nose as needed, if found unresponsive then spray into nose and call 911. 04/13/2021: Never taken     ondansetron (ZOFRAN) 4 MG tablet Take 1 tablet (4 mg total) by mouth every 8 (eight) hours as needed for nausea or vomiting.    oxyCODONE-acetaminophen (PERCOCET) 10-325 MG tablet Take 1 tablet by mouth 5 (five) times daily as needed.    rosuvastatin (CRESTOR) 40 MG tablet Take 1 tablet (40 mg total) by mouth daily.    sertraline (ZOLOFT) 100 MG tablet Take 0.5 tablets (50 mg total) by mouth daily.    sevelamer carbonate (RENVELA) 800 MG tablet Take 3,200 mg by mouth 3 (three) times daily with meals. Take 800 mg with snacks as needed    No facility-administered encounter medications on file as of 08/26/2021.    Patient Active Problem List   Diagnosis Date Noted   Chest congestion 07/30/2021   Intractable nausea and vomiting 06/03/2021   Grief 04/01/2021   Diabetic neuropathy ([place]) 01/29/2021   Neck pain 01/07/2021   Hypertensive heart disease with chronic diastolic congestive heart failure ([place]) 01/06/2021   Left lateral abdominal pain 02/02/2020   Arthritis of left knee 02/11/2019   Hidradenitis suppurativa 02/12/2018   Anemia in chronic kidney disease 02/12/2018   Secondary hyperparathyroidism of renal origin ([place]) 01/30/2018   Coagulation defect, unspecified ([place]) 01/18/2018   Major  depressive disorder 08/11/2017   Sinus tachycardia 02/23/2017   ESRD (end stage renal disease) on dialysis ([place]) 01/05/2017   CAD S/P BMS PCI to prox LAD 03/31/2016   Abnormal uterine bleeding (AUB) 03/17/2016   PAOD (peripheral arterial occlusive disease) ([place]) 01/08/2016   Charcot foot due to diabetes mellitus ([place]) 09/07/2015   Peripheral neuropathy 09/20/2014   Essential hypertension 08/16/2014   Seasonal allergic rhinitis 08/16/2014   Candidal vulvovaginitis 02/09/2014   Morbid obesity ([place]) 09/26/2012   Hyperlipidemia associated with type 2 diabetes mellitus ([place]) 02/26/2009   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, with long-term current use of insulin ([place]) 05/08/2007   Asthma 05/08/2007    Conditions to be addressed/monitored: HTN, DMII, and ESRD  Care Plan : CCM RN Diabetes Type 2 (Adult)  Updates made by Johnney Killian, RN since 08/26/2021 12:00 AM     Problem:  Disease Progression (Diabetes, Type 2)      Goal: Disease Progression Prevented or Minimized   Start Date: 01/31/2020  Recent Progress: On track  Priority: Low  Note:   CARE PLAN ENTRY (see longitudinal plan of care for additional care plan information)  Current Barriers:  Chronic Disease Management support, education, and care coordination needs related to CAD, HTN, HLD, DMII, and ESRD- Successful call to patient this afternoon.  Patient continues to have lingering issues with Covid.  Per patient, she stared with Covid on 07/26/21 when she first tested positive.  Patient symptoms were vomiting, loss of taste and smell.  She continues to not feel well and missed ESRD yesterday because she was ill and vomiting.  Patient had a negative home test on 08/07/21 but tested positive on the 26th and the 28th at ESRD.  Patient agreed that she should discuss her lingering illness with a doctor as she has numerous comorbidity diagnoses.  Message sent to IMP yellow team and front desk pool to  see if she can have a telephone visit after her ESRD tomorrow.   Patient also noted her blood pressure has been elevated- 148/78 from last ESRD and she is not taking her CBG as she does not have a working meter.  Has appointment with Debera Lat, diabetic educator, next week to address.   Clinical Goal(s) related to CAD, HTN, HLD, DMII, and ESRD:  Over the next 30-60 days, patient will:  Work with the care management team to address educational, disease management, and care coordination needs  Begin or continue self health monitoring activities as directed today Call provider office for new or worsened signs and symptoms New or worsened symptom related to DM, HTN, CAD, ESRD Call care management team with questions or concerns Verbalize basic understanding of patient centered plan of care established today  Interventions related to CAD, HTN, HLD, DMII, and ESRD:  Prior to interviewing patient, reviewed patient status, including review of recent office visit notes, consultants reports, relevant laboratory and other test results, and medications, completed. Provided patient with the opportunity to discuss the death of her mother and emotional support provided Assessed patient's self management skills in regards to HTN, IDDM, HLD, CAD and ESRD  Evaluation of current treatment plan related to HTN and IDDM, HLD and ESRD and patient's adherence to plan as established by provider. Reviewed medications with patient and assessed medication taking behavior Assessed patient's satisfaction with CGM Rx and reviewed episodes of hypoglycemia and hyperglycemia Reviewed how to treat hypoglycemia and encouraged patient to carry sources of quick sugar with her at all times especially when at dialysis center  Much positive reinforcement given to patient for taking charge of her health issues and using the tools and her knowledge to improve her health despite the deaths of close friends and family in the last year-  Patient is attending grief counseling weekly and she finds it helpful for someone just to listen. Reviewed upcoming appointments including: cardiology appointment on 8/25  Patient Self Care Activities related to CAD, HTN, HLD, DMII, and ESRD:  Patient is unable to independently self-manage chronic health conditions      Plan: Telephone follow up appointment with care management team member scheduled for:  Follow up after response for phone visit.  Johnney Killian, RN, BSN, CCM Care Management Coordinator [facility] Phone: [phone] / Fax: [phone]

## 2021-08-27 ENCOUNTER — Other Ambulatory Visit (HOSPITAL_COMMUNITY): Payer: Self-pay

## 2021-08-27 ENCOUNTER — Ambulatory Visit (INDEPENDENT_AMBULATORY_CARE_PROVIDER_SITE_OTHER): Payer: 59 | Admitting: Student

## 2021-08-27 ENCOUNTER — Other Ambulatory Visit: Payer: Self-pay

## 2021-08-27 DIAGNOSIS — R112 Nausea with vomiting, unspecified: Secondary | ICD-10-CM

## 2021-08-27 MED ORDER — METOCLOPRAMIDE HCL 10 MG PO TABS
10.0000 mg | ORAL_TABLET | Freq: Three times a day (TID) | ORAL | 0 refills | Status: DC | PRN
  Filled 2021-08-27: qty 90, 30d supply, fill #0

## 2021-08-27 MED ORDER — SENNOSIDES-DOCUSATE SODIUM 8.6-50 MG PO TABS
1.0000 | ORAL_TABLET | Freq: Two times a day (BID) | ORAL | 1 refills | Status: DC
  Filled 2021-08-27: qty 60, 30d supply, fill #0

## 2021-08-27 NOTE — Progress Notes (Signed)
[facility] Internal Medicine Residency Telephone Encounter Continuity Care Appointment  HPI:  This telephone encounter was created for [name] on 08/27/2021 for the following purpose/cc: post-COVID symptoms. Verified identity via DOB and home address. Had [place] on 9/16 and afterwards her stomach has not been quite right. Appetite comes and goes, but intermittently becomes nauseous with occasional vomiting episodes. Gets at least 2 meals a day, but sometimes becomes nauseous before meals. Becomes nauseous some days, primarily on days she gets HD. Had a couple episodes of nonbilious, nonbloody emesis this past Saturday night and Sunday morning but no other episodes since then.  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 She has tried zofran, which used to help, but does not anymore. Was placed on reglan for suspected gastroparesis, which has helped with early satiety and with nausea. States that eating smaller meals has helped tremendously as well. However, she had a gastric emptying study completed on 10/4 with normal results so likely not gastroparesis. Of note, patient does note that she has been experiencing constipation over the past week. States she may have had one small BM over this past week, but no more than just that one. Patient is on a strong dose of percocet for chronic pain. She is not currently on laxatives. Overall, patient's presentation is unclear. She has been dealing with intractable nausea and vomiting over the past few months even prior to her COVID infection. Gastric emptying study has ruled out gastroparesis as the cause. Constipation may be playing a role in her symptomology, so will prescribe Senna-S BID for her to take daily to help relieve her constipation given long-term therapeutic opiate use. The next step would be to have her evaluated by GI for potential endoscopy to determine if there are any structural  etiologies resulting in her symptomology. Will place that today. Will also refill Reglan as she does note improvement in her symptoms.   Past Medical History:  Past Medical History:  Diagnosis Date   Abnormal uterine bleeding (AUB)    Asthma    prn inhaler   CAD S/P BMS PCI to prox LAD cardiologist--- dr Beverlyn Roux LAD to Mid LAD lesion, 90% stenosed. Post intervention - Vision BMS 3.0 mm x 18 mm (~3.5 mm) there is a 0% residual stenosis. ;   nuclear stress test-- 09-13-2018 low risk with no ischemia, nuclear ef 56%   Charcot foot due to diabetes mellitus ([place]) 11/2016   left 2018; right 2019   Depression    Diabetic foot ulcer ([place]) 04/22/2019   Diabetic neuropathy (HCC)    feet   Diabetic retinopathy, nonproliferative, severe ([place])    bilateral   ESRD (end stage renal disease) on dialysis [facility])    ?chronic interstitial nephritis  ([facility]   H/O non-ST elevation myocardial infarction (NSTEMI) 03/2016   Found in 90% mid LAD lesion treated with bare-metal stent (BMS) PCI - vision BMS 3.0 mm x 18 mm   History of MRSA infection 2007   Hyperlipidemia    Hypertension    IDA (iron deficiency anemia)    Insulin dependent type 2 diabetes mellitus (HCC)    Iron deficiency anemia    takes iron supplement   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   Assessment / Plan / Recommendations:  Please see A&P under problem oriented charting for assessment of the patient's acute and chronic medical conditions.  As always, pt is advised that if symptoms worsen or new symptoms arise, they should go to an urgent care  facility or to to ER for further evaluation.   Consent and Medical Decision Making:  Patient discussed with Dr. Evette Doffing This is a telephone encounter between [facility] and [place] on 08/27/2021 for N/V. The visit was conducted with the patient located at home and [place] at [facility]. The patient's identity was confirmed using their DOB and current address. The patient has consented to being evaluated through a telephone encounter and understands the associated risks (an examination cannot be done and the patient may need to come in for an appointment) / benefits (allows the patient to remain at home, decreasing exposure to coronavirus). I personally spent 16 minutes on medical discussion.

## 2021-08-27 NOTE — Assessment & Plan Note (Signed)
Had [place] on 9/16 and afterwards her stomach has not been quite right. Appetite comes and goes, but intermittently becomes nauseous with occasional vomiting episodes. Gets at least 2 meals a day, but sometimes becomes nauseous before meals. Becomes nauseous some days, primarily on days she gets HD. Had a couple episodes of nonbilious, nonbloody emesis this past Saturday night and Sunday morning but no other episodes since then.   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 She has tried zofran, which used to help, but does not anymore. Was placed on reglan for suspected gastroparesis, which has helped with early satiety and with nausea. States that eating smaller meals has helped tremendously as well. However, she had a gastric emptying study completed on 10/4 with normal results so likely not gastroparesis.  Of note, patient does note that she has been experiencing constipation over the past week. States she may have had one small BM over this past week, but no more than just that one. Patient is on a strong dose of percocet for chronic pain. She is not currently on laxatives.  Overall, patient's presentation is unclear. She has been dealing with intractable nausea and vomiting over the past few months even prior to her COVID infection. Gastric emptying study has ruled out gastroparesis as the cause. Constipation may be playing a role in her symptomology, so will prescribe Senna-S BID for her to take daily to help relieve her constipation given long-term therapeutic opiate use. The next step would be to have her evaluated by GI for potential endoscopy to determine if there are any structural etiologies resulting in her symptomology. Will place that today. Will also refill Reglan as she does note improvement in her symptoms.  Plan: -refilled Reglan TID -continue smaller, frequent meals -Senna-S BID for constipation -referral to GI for further evaluation

## 2021-08-30 NOTE — Progress Notes (Signed)
Internal Medicine Clinic Attending  Case discussed with Dr. Jinwala  At the time of the visit.  We reviewed the resident's history and exam and pertinent patient test results.  I agree with the assessment, diagnosis, and plan of care documented in the resident's note.  

## 2021-08-31 ENCOUNTER — Ambulatory Visit: Payer: 59 | Admitting: Dietician

## 2021-09-06 ENCOUNTER — Other Ambulatory Visit (HOSPITAL_COMMUNITY): Payer: Self-pay

## 2021-09-15 ENCOUNTER — Ambulatory Visit: Payer: Medicare Other | Admitting: Obstetrics & Gynecology

## 2021-09-24 ENCOUNTER — Other Ambulatory Visit (HOSPITAL_COMMUNITY): Payer: Self-pay

## 2021-09-24 MED ORDER — OXYCODONE-ACETAMINOPHEN 10-325 MG PO TABS
1.0000 | ORAL_TABLET | Freq: Every day | ORAL | 0 refills | Status: DC | PRN
  Filled 2021-09-24: qty 150, 32d supply, fill #0
  Filled 2021-09-27: qty 70, 15d supply, fill #0
  Filled 2021-09-27: qty 70, 14d supply, fill #0
  Filled 2021-09-27: qty 80, 16d supply, fill #0
  Filled 2021-09-27: qty 80, 17d supply, fill #0

## 2021-09-27 ENCOUNTER — Other Ambulatory Visit (HOSPITAL_COMMUNITY): Payer: Self-pay

## 2021-10-05 ENCOUNTER — Other Ambulatory Visit (HOSPITAL_COMMUNITY): Payer: Self-pay

## 2021-10-05 MED ORDER — AMOXICILLIN 500 MG PO CAPS
500.0000 mg | ORAL_CAPSULE | Freq: Three times a day (TID) | ORAL | 0 refills | Status: DC
  Filled 2021-10-05: qty 21, 7d supply, fill #0

## 2021-10-05 MED ORDER — SODIUM FLUORIDE 1.1 % DT PSTE
PASTE | DENTAL | 5 refills | Status: DC
  Filled 2021-10-05: qty 100, 20d supply, fill #0
  Filled 2022-05-13: qty 100, 20d supply, fill #1

## 2021-10-06 ENCOUNTER — Other Ambulatory Visit: Payer: Self-pay

## 2021-10-06 ENCOUNTER — Encounter: Payer: Self-pay | Admitting: Obstetrics & Gynecology

## 2021-10-06 ENCOUNTER — Ambulatory Visit (INDEPENDENT_AMBULATORY_CARE_PROVIDER_SITE_OTHER): Payer: 59 | Admitting: Obstetrics & Gynecology

## 2021-10-06 ENCOUNTER — Other Ambulatory Visit (HOSPITAL_COMMUNITY): Payer: Self-pay

## 2021-10-06 VITALS — BP 107/50 | HR 93 | Ht 65.0 in | Wt 223.0 lb

## 2021-10-06 DIAGNOSIS — Z01419 Encounter for gynecological examination (general) (routine) without abnormal findings: Secondary | ICD-10-CM | POA: Diagnosis not present

## 2021-10-06 DIAGNOSIS — Z1231 Encounter for screening mammogram for malignant neoplasm of breast: Secondary | ICD-10-CM

## 2021-10-06 DIAGNOSIS — T8332XA Displacement of intrauterine contraceptive device, initial encounter: Secondary | ICD-10-CM

## 2021-10-11 ENCOUNTER — Telehealth: Payer: Self-pay | Admitting: Internal Medicine

## 2021-10-11 ENCOUNTER — Encounter: Payer: Self-pay | Admitting: Obstetrics & Gynecology

## 2021-10-11 ENCOUNTER — Other Ambulatory Visit (HOSPITAL_COMMUNITY): Payer: Self-pay

## 2021-10-11 NOTE — Progress Notes (Signed)
Subjective:     Jane [name] is a 45 y.o. female here for a routine exam.  Current complaints: Pt reports cont AUB.     Gynecologic History No LMP recorded (lmp unknown). (Menstrual status: Perimenopausal). Contraception: tubal ligation Last Pap: 05/20/2020. Results were: normal Last mammogram: 08/10/2021. Results were: normal  Obstetric History OB History  Gravida Para Term Preterm AB Living  2 2 1 1  0 2  SAB IAB Ectopic Multiple Live Births  0 0 0 0      # Outcome Date GA Lbr Len/2nd Weight Sex Delivery Anes PTL Lv  2 Preterm           1 Term            The following portions of the patient's history were reviewed and updated as appropriate: allergies, current medications, past family history, past medical history, past social history, past surgical history, and problem list.  Review of Systems Pertinent items are noted in HPI.    Objective:  BP (!) 107/50   Pulse 93   Ht 5\' 5"  (1.651 m)   Wt 223 lb (101.2 kg)   LMP  (LMP Unknown)   BMI 37.11 kg/m  General Appearance:    Alert, cooperative, no distress, appears stated age  Head:    Normocephalic, without obvious abnormality, atraumatic  Eyes:    conjunctiva/corneas clear, EOM's intact, both eyes  Ears:    Normal external ear canals, both ears  Nose:   Nares normal, septum midline, mucosa normal, no drainage    or sinus tenderness  Throat:   Lips, mucosa, and tongue normal; teeth and gums normal  Neck:   Supple, symmetrical, trachea midline, no adenopathy;    thyroid:  no enlargement/tenderness/nodules  Back:     Symmetric, no curvature, ROM normal, no CVA tenderness  Lungs:     respirations unlabored  Chest Wall:    No tenderness or deformity   Heart:    Regular rate and rhythm  Breast Exam:    No tenderness, masses, or nipple abnormality  Abdomen:     Soft, non-tender, bowel sounds active all four quadrants,    no masses, no organomegaly  Genitalia:    Normal female without lesion, discharge or tenderness      Extremities:   Extremities normal, atraumatic, no cyanosis or edema  Pulses:   2+ and symmetric all extremities  Skin:   Skin color, texture, turgor normal, no rashes or lesions    09/15/2020 FINAL MICROSCOPIC DIAGNOSIS:   A. ENDOMETRIUM, CURETTAGE:  - Benign inactive endometrium with hormone-effect  - Adenomyosis  - Negative for hyperplasia or malignancy  Assessment:    Healthy female exam.  AUB   Plan:  Jane [name] was seen today for annual exam.  Diagnoses and all orders for this visit:  Breast cancer screening by mammogram -     MM DIGITAL SCREENING BILATERAL; Future  Intrauterine contraceptive device threads lost, initial encounter -     US Transvaginal Non-OB; Future   Patient desires surgical management with hysteroscopy with endometrial ablation.  The risks of surgery were discussed in detail with the patient including but not limited to: bleeding which may require transfusion or reoperation; infection which may require prolonged hospitalization or re-hospitalization and antibiotic therapy; injury to bowel, bladder, ureters and major vessels or other surrounding organs; need for additional procedures including laparotomy; thromboembolic phenomenon, incisional problems and other postoperative or anesthesia complications.  Patient was told that the likelihood that her condition and symptoms  will be treated effectively with this surgical management was very high; the postoperative expectations were also discussed in detail. The patient also understands the alternative treatment options which were discussed in full. All questions were answered.  She was told that she will be contacted by our surgical scheduler regarding the time and date of her surgery; routine preoperative instructions of having nothing to eat or drink after midnight on the day prior to surgery and also coming to the hospital 1 1/2 hours prior to her time of surgery were also emphasized.  She was told she may be called  for a preoperative appointment about a week prior to surgery and will be given further preoperative instructions at that visit. Printed patient education handouts about the procedure were given to the patient to review at home.   TC to Int Med for pre-op appt. They will call pt to schedule.     Jane [name] L. Harraway-Smith, M.D., Jane [name]

## 2021-10-11 NOTE — Telephone Encounter (Signed)
-----   Message from Aldine Contes, MD sent at 10/06/2021  4:44 PM EST ----- Regarding: FW: Hello all, The patient below needs to be scheduled for a pre-op clearance for a hysteroscopy with endometrial ablation. Can we set up an appointment for her next week?  Thank you, Nischal ----- Message ----- From: Lavonia Drafts, MD Sent: 10/06/2021   4:38 PM EST To: Aldine Contes, MD  Greetings,   I hope that you are well. This pt is followed in your clinic and she is having surgery. She needs preop clearance but, says that the residents don't do it. Not sure how accurate that is but, can you have someone contact her for preop clearance.   It is for a hysteroscopy with endometrial ablation. It does require general and is ~59min and completely outpt. I have tried to avoid surgery however, she continue to bleed daily.   Also, please let me know what the process is in the future.   Thank you,   Clh-S

## 2021-10-11 NOTE — Telephone Encounter (Signed)
Please refer to message below.  Just spoke with patient and she agreed to an appointment for this Thursday 10/14/21 at 9:15 am with  Dr. Marlou Sa.

## 2021-10-12 ENCOUNTER — Other Ambulatory Visit: Payer: Self-pay | Admitting: Obstetrics & Gynecology

## 2021-10-12 ENCOUNTER — Other Ambulatory Visit (HOSPITAL_COMMUNITY): Payer: Self-pay

## 2021-10-12 DIAGNOSIS — N939 Abnormal uterine and vaginal bleeding, unspecified: Secondary | ICD-10-CM

## 2021-10-12 MED ORDER — MEGESTROL ACETATE 40 MG PO TABS
40.0000 mg | ORAL_TABLET | Freq: Every day | ORAL | 3 refills | Status: DC
  Filled 2021-10-12: qty 30, 10d supply, fill #0
  Filled 2021-11-02: qty 30, 10d supply, fill #1
  Filled 2021-12-01: qty 30, 10d supply, fill #2

## 2021-10-13 ENCOUNTER — Other Ambulatory Visit (HOSPITAL_COMMUNITY): Payer: Self-pay

## 2021-10-14 ENCOUNTER — Ambulatory Visit (INDEPENDENT_AMBULATORY_CARE_PROVIDER_SITE_OTHER): Payer: 59 | Admitting: Internal Medicine

## 2021-10-14 ENCOUNTER — Encounter: Payer: Self-pay | Admitting: Internal Medicine

## 2021-10-14 VITALS — BP 136/70 | HR 87 | Temp 98.9°F | Wt 224.4 lb

## 2021-10-14 DIAGNOSIS — Z01818 Encounter for other preprocedural examination: Secondary | ICD-10-CM | POA: Diagnosis not present

## 2021-10-14 DIAGNOSIS — Z01810 Encounter for preprocedural cardiovascular examination: Secondary | ICD-10-CM | POA: Insufficient documentation

## 2021-10-14 NOTE — Progress Notes (Signed)
Internal Medicine Clinic Attending  I saw and evaluated the patient.  I personally confirmed the key portions of the history and exam documented by Dr.  Dean  and I reviewed pertinent patient test results.  The assessment, diagnosis, and plan were formulated together and I agree with the documentation in the resident's note.  

## 2021-10-14 NOTE — Progress Notes (Addendum)
     CC: Pre-op evaluation  HPI:  Jane [name] is at 45 y.o. F with PMH as listed below who presents for pre-operative clearance for hysteroscopy and endometrial ablation in February 2023. Please see problem-based charting for assessment and plan.  Past Medical History:  Diagnosis Date   Abnormal uterine bleeding (AUB)    Asthma    prn inhaler   CAD S/P BMS PCI to prox LAD cardiologist--- dr Beverlyn Roux LAD to Mid LAD lesion, 90% stenosed. Post intervention - Vision BMS 3.0 mm x 18 mm (~3.5 mm) there is a 0% residual stenosis. ;   nuclear stress test-- 09-13-2018 low risk with no ischemia, nuclear ef 56%   Charcot foot due to diabetes mellitus ([place]) 11/2016   left 2018; right 2019   Depression    Diabetic foot ulcer ([place]) 04/22/2019   Diabetic neuropathy (HCC)    feet   Diabetic retinopathy, nonproliferative, severe ([employer])    bilateral   ESRD (end stage renal disease) on dialysis [facility])    ?chronic interstitial nephritis  ([facility]   H/O non-ST elevation myocardial infarction (NSTEMI) 03/2016   Found in 90% mid LAD lesion treated with bare-metal stent (BMS) PCI - vision BMS 3.0 mm x 18 mm   History of MRSA infection 2007   Hyperlipidemia    Hypertension    IDA (iron deficiency anemia)    Insulin dependent type 2 diabetes mellitus (HCC)    Iron deficiency anemia    takes iron supplement   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 Negative for chills, fever and weight loss.  HENT:  Positive for congestion. Negative for ear discharge and ear pain.    Eyes:  Negative for double vision and pain.  Respiratory:  Negative for cough and shortness of breath.   Cardiovascular:  Negative for chest pain, palpitations, orthopnea and leg swelling.  Gastrointestinal:  Negative for abdominal pain, nausea and vomiting.  Genitourinary:  Negative for dysuria and urgency.  Musculoskeletal:  Positive for joint pain (L knee arthritis). Negative for myalgias.  Neurological:  Positive for dizziness (after dialysis), weakness (after dialysis) and headaches (after dialysis). Negative for focal weakness and loss of consciousness.  Psychiatric/Behavioral:  Positive for depression. The patient is nervous/anxious and has insomnia.    Physical Exam:  Vitals:   10/14/21 0920  BP: 136/70  Pulse: 87  Temp: 98.9 F (37.2 C)  TempSrc: Oral  SpO2: 100%  Weight: 224 lb 6.4 oz (101.8 kg)   Constitutional: Stated-age, in no acute distress. Cardio: Regular rate and rhythm. No murmurs, rubs, gallops. Pulm: Clear to auscultation  bilaterally. Normal work of breathing. Abdomen: Soft, non-tender, non-distended. MSK: No extremity edema noted. Skin: Warm and dry. Neuro: Alert and oriented x3. No focal deficit noted. Psych: Normal mood and affect.  Assessment & Plan:   See Encounters Tab for problem based charting.  Patient seen with Dr. Philipp Ovens

## 2021-10-14 NOTE — Assessment & Plan Note (Signed)
Patient is scheduled for hysteroscopy and endometrial ablation in February 2023 and is here for pre-operative clearance. She does have multiple comorbidities that make her a high-risk surgical candidate including but not limited to insulin-dependent diabetes, CAD, diastolic heart failure, hypertension. Her revised cardiac risk index for pre-operative risk is 3, making her class IV risk with 15.0% 30-day risk of death, MI, or cardiac arrest.   Her most recent HbA1c is <7% and she reports being compliant with diabetes regimen. She denies chest pain, palpitations, or claudication. Though she is limited in exercise tolerance she reports that it is due to arthritic pain in her knees with an estimated MET of around 4. She denies retaining extra fluid, orthopnea. She is a dialysis patient for ESRD. Her BP is well controlled on current regimen as evidenced by triage reading of 136/70 today. At this time, her medical conditions are optimized for surgery.

## 2021-10-14 NOTE — Progress Notes (Deleted)
   CC: ***  HPI:  [name] is a 45 y.o.   Has had surgery before, no trouble with anesthesia during those procedures  Moves around at home, takes care of ADLs. No longer has an aid.   Does not smoke  Past Medical History:  Diagnosis Date   Abnormal uterine bleeding (AUB)    Asthma    prn inhaler   CAD S/P BMS PCI to prox LAD cardiologist--- dr Beverlyn Roux LAD to Mid LAD lesion, 90% stenosed. Post intervention - Vision BMS 3.0 mm x 18 mm (~3.5 mm) there is a 0% residual stenosis. ;   nuclear stress test-- 09-13-2018 low risk with no ischemia, nuclear ef 56%   Charcot foot due to diabetes mellitus ([place]) 11/2016   left 2018; right 2019   Depression    Diabetic foot ulcer ([place]) 04/22/2019   Diabetic neuropathy (HCC)    feet   Diabetic retinopathy, nonproliferative, severe ([place])    bilateral   ESRD (end stage renal disease) on dialysis [facility])    ?chronic interstitial nephritis  ([facility]   H/O non-ST elevation myocardial infarction (NSTEMI) 03/2016   Found in 90% mid LAD lesion treated with bare-metal stent (BMS) PCI - vision BMS 3.0 mm x 18 mm   History of MRSA infection 2007   Hyperlipidemia    Hypertension    IDA (iron deficiency anemia)    Insulin dependent type 2 diabetes mellitus (HCC)    Iron deficiency anemia    takes iron supplement   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 Negative for chills, fever and weight loss.  HENT:  Positive for congestion. Negative for hearing loss.   Eyes: Negative.   Respiratory:  Negative for  shortness of breath and wheezing.   Cardiovascular:  Negative for chest pain, palpitations, orthopnea and leg swelling.  Gastrointestinal:  Negative for abdominal pain, constipation and diarrhea.  Genitourinary:  Negative for dysuria, frequency and urgency.  Musculoskeletal:  Positive for joint pain (arthritis in L knee). Negative for myalgias.  Neurological:  Positive for weakness (after dialysis but resolves). Negative for dizziness, focal weakness, loss of consciousness and headaches.  Psychiatric/Behavioral:  Positive for depression. The patient is nervous/anxious and has insomnia.     Physical Exam:  Vitals:   10/14/21 0920  BP: 136/70  Pulse: 87  Temp: 98.9 F (37.2 C)  TempSrc: Oral  SpO2: 100%  Weight: 224 lb 6.4 oz (101.8 kg)   ***  Assessment & Plan:   See Encounters Tab for problem based charting.  Patient {GC/GE:[id number]::"discussed with","seen with"} Dr. {NAMES:[id number]::"Guilloud","Hoffman","Mullen","Narendra","Williams","Vincent"}

## 2021-10-14 NOTE — Patient Instructions (Signed)
Thank you for visiting the [facility] Medicine Clinic today. It was a pleasure to meet you! Today we discussed your upcoming procedure and discussed your medical history and possible risk factors going into surgery. We feel that though your medical conditions make you high risk for surgery, that they are well-optimized at this time.  I have ordered the following for you:  Referrals: For colonoscopy.  Remember: If you have any questions or concerns, please call our clinic at [phone] between 9am-5pm and after hours call [phone] and ask for the internal medicine resident on call. If you feel you are having a medical emergency please call 911.  Farrel Gordon, DO

## 2021-10-19 ENCOUNTER — Encounter: Payer: Self-pay | Admitting: Gastroenterology

## 2021-10-21 ENCOUNTER — Other Ambulatory Visit: Payer: Self-pay

## 2021-10-21 ENCOUNTER — Ambulatory Visit (HOSPITAL_COMMUNITY)
Admission: RE | Admit: 2021-10-21 | Discharge: 2021-10-21 | Disposition: A | Discharge status: Home / Self Care | Payer: 59 | Source: Ambulatory Visit | Attending: Obstetrics & Gynecology | Admitting: Obstetrics & Gynecology

## 2021-10-21 ENCOUNTER — Encounter (HOSPITAL_BASED_OUTPATIENT_CLINIC_OR_DEPARTMENT_OTHER): Payer: Self-pay

## 2021-10-21 DIAGNOSIS — T8332XA Displacement of intrauterine contraceptive device, initial encounter: Secondary | ICD-10-CM | POA: Insufficient documentation

## 2021-10-21 NOTE — Progress Notes (Signed)
Last read by Caleen Essex Byrom at 8:20 AM on 10/21/2021.

## 2021-10-21 NOTE — Progress Notes (Signed)
Results shared via mychart message

## 2021-10-25 ENCOUNTER — Other Ambulatory Visit (HOSPITAL_COMMUNITY): Payer: Self-pay

## 2021-10-25 MED ORDER — OXYCODONE-ACETAMINOPHEN 10-325 MG PO TABS
1.0000 | ORAL_TABLET | Freq: Every day | ORAL | 0 refills | Status: DC
  Filled 2021-10-25 – 2021-10-27 (×2): qty 150, 30d supply, fill #0

## 2021-10-26 ENCOUNTER — Other Ambulatory Visit (HOSPITAL_COMMUNITY): Payer: Self-pay

## 2021-10-26 MED ORDER — IBUPROFEN 800 MG PO TABS
800.0000 mg | ORAL_TABLET | Freq: Four times a day (QID) | ORAL | 0 refills | Status: DC | PRN
  Filled 2021-10-26: qty 11, 3d supply, fill #0

## 2021-10-27 ENCOUNTER — Other Ambulatory Visit (HOSPITAL_COMMUNITY): Payer: Self-pay

## 2021-10-27 ENCOUNTER — Other Ambulatory Visit: Payer: Self-pay | Admitting: Internal Medicine

## 2021-10-27 DIAGNOSIS — E113493 Type 2 diabetes mellitus with severe nonproliferative diabetic retinopathy without macular edema, bilateral: Secondary | ICD-10-CM

## 2021-10-27 DIAGNOSIS — Z794 Long term (current) use of insulin: Secondary | ICD-10-CM

## 2021-10-27 MED ORDER — GABAPENTIN 100 MG PO CAPS
100.0000 mg | ORAL_CAPSULE | Freq: Every day | ORAL | 0 refills | Status: DC
  Filled 2021-10-27: qty 30, 30d supply, fill #0

## 2021-11-02 ENCOUNTER — Other Ambulatory Visit (HOSPITAL_COMMUNITY): Payer: Self-pay

## 2021-11-04 ENCOUNTER — Other Ambulatory Visit (HOSPITAL_COMMUNITY): Payer: Self-pay

## 2021-11-08 ENCOUNTER — Inpatient Hospital Stay (HOSPITAL_COMMUNITY)
Admission: EM | Admit: 2021-11-08 | Discharge: 2021-11-11 | DRG: 871 | Disposition: A | Discharge status: Home / Self Care | Payer: 59 | Attending: Internal Medicine | Admitting: Internal Medicine

## 2021-11-08 ENCOUNTER — Encounter (HOSPITAL_COMMUNITY): Payer: Self-pay

## 2021-11-08 ENCOUNTER — Emergency Department (HOSPITAL_COMMUNITY): Payer: 59

## 2021-11-08 ENCOUNTER — Other Ambulatory Visit: Payer: Self-pay

## 2021-11-08 DIAGNOSIS — I2510 Atherosclerotic heart disease of native coronary artery without angina pectoris: Secondary | ICD-10-CM | POA: Diagnosis present

## 2021-11-08 DIAGNOSIS — J189 Pneumonia, unspecified organism: Secondary | ICD-10-CM

## 2021-11-08 DIAGNOSIS — I132 Hypertensive heart and chronic kidney disease with heart failure and with stage 5 chronic kidney disease, or end stage renal disease: Secondary | ICD-10-CM | POA: Diagnosis present

## 2021-11-08 DIAGNOSIS — Z992 Dependence on renal dialysis: Secondary | ICD-10-CM | POA: Diagnosis not present

## 2021-11-08 DIAGNOSIS — D573 Sickle-cell trait: Secondary | ICD-10-CM | POA: Diagnosis present

## 2021-11-08 DIAGNOSIS — E875 Hyperkalemia: Secondary | ICD-10-CM | POA: Diagnosis not present

## 2021-11-08 DIAGNOSIS — J101 Influenza due to other identified influenza virus with other respiratory manifestations: Secondary | ICD-10-CM

## 2021-11-08 DIAGNOSIS — J1008 Influenza due to other identified influenza virus with other specified pneumonia: Secondary | ICD-10-CM | POA: Diagnosis present

## 2021-11-08 DIAGNOSIS — Z91048 Other nonmedicinal substance allergy status: Secondary | ICD-10-CM | POA: Diagnosis not present

## 2021-11-08 DIAGNOSIS — R6520 Severe sepsis without septic shock: Secondary | ICD-10-CM | POA: Diagnosis not present

## 2021-11-08 DIAGNOSIS — E1140 Type 2 diabetes mellitus with diabetic neuropathy, unspecified: Secondary | ICD-10-CM | POA: Diagnosis present

## 2021-11-08 DIAGNOSIS — J9601 Acute respiratory failure with hypoxia: Secondary | ICD-10-CM | POA: Diagnosis not present

## 2021-11-08 DIAGNOSIS — D631 Anemia in chronic kidney disease: Secondary | ICD-10-CM | POA: Diagnosis present

## 2021-11-08 DIAGNOSIS — Z87891 Personal history of nicotine dependence: Secondary | ICD-10-CM

## 2021-11-08 DIAGNOSIS — Z8249 Family history of ischemic heart disease and other diseases of the circulatory system: Secondary | ICD-10-CM

## 2021-11-08 DIAGNOSIS — Z09 Encounter for follow-up examination after completed treatment for conditions other than malignant neoplasm: Secondary | ICD-10-CM

## 2021-11-08 DIAGNOSIS — E1122 Type 2 diabetes mellitus with diabetic chronic kidney disease: Secondary | ICD-10-CM | POA: Diagnosis present

## 2021-11-08 DIAGNOSIS — J159 Unspecified bacterial pneumonia: Secondary | ICD-10-CM | POA: Diagnosis present

## 2021-11-08 DIAGNOSIS — I5032 Chronic diastolic (congestive) heart failure: Secondary | ICD-10-CM | POA: Diagnosis present

## 2021-11-08 DIAGNOSIS — M898X9 Other specified disorders of bone, unspecified site: Secondary | ICD-10-CM | POA: Diagnosis present

## 2021-11-08 DIAGNOSIS — N186 End stage renal disease: Secondary | ICD-10-CM | POA: Diagnosis present

## 2021-11-08 DIAGNOSIS — I252 Old myocardial infarction: Secondary | ICD-10-CM | POA: Diagnosis not present

## 2021-11-08 DIAGNOSIS — E785 Hyperlipidemia, unspecified: Secondary | ICD-10-CM | POA: Diagnosis present

## 2021-11-08 DIAGNOSIS — A419 Sepsis, unspecified organism: Principal | ICD-10-CM | POA: Diagnosis present

## 2021-11-08 DIAGNOSIS — Z794 Long term (current) use of insulin: Secondary | ICD-10-CM

## 2021-11-08 DIAGNOSIS — E1151 Type 2 diabetes mellitus with diabetic peripheral angiopathy without gangrene: Secondary | ICD-10-CM | POA: Diagnosis present

## 2021-11-08 DIAGNOSIS — Z20822 Contact with and (suspected) exposure to covid-19: Secondary | ICD-10-CM | POA: Diagnosis present

## 2021-11-08 DIAGNOSIS — Z955 Presence of coronary angioplasty implant and graft: Secondary | ICD-10-CM | POA: Diagnosis not present

## 2021-11-08 DIAGNOSIS — Z79899 Other long term (current) drug therapy: Secondary | ICD-10-CM

## 2021-11-08 DIAGNOSIS — J45909 Unspecified asthma, uncomplicated: Secondary | ICD-10-CM | POA: Diagnosis present

## 2021-11-08 DIAGNOSIS — I160 Hypertensive urgency: Secondary | ICD-10-CM | POA: Diagnosis present

## 2021-11-08 DIAGNOSIS — E1142 Type 2 diabetes mellitus with diabetic polyneuropathy: Secondary | ICD-10-CM

## 2021-11-08 DIAGNOSIS — Z833 Family history of diabetes mellitus: Secondary | ICD-10-CM

## 2021-11-08 HISTORY — DX: Pneumonia, unspecified organism: J18.9

## 2021-11-08 LAB — CBC WITH DIFFERENTIAL/PLATELET
Abs Immature Granulocytes: 0.08 10*3/uL — ABNORMAL HIGH (ref 0.00–0.07)
Basophils Absolute: 0 10*3/uL (ref 0.0–0.1)
Basophils Relative: 0 %
Eosinophils Absolute: 0.1 10*3/uL (ref 0.0–0.5)
Eosinophils Relative: 1 %
HCT: 30.1 % — ABNORMAL LOW (ref 36.0–46.0)
Hemoglobin: 9.2 g/dL — ABNORMAL LOW (ref 12.0–15.0)
Immature Granulocytes: 1 %
Lymphocytes Relative: 12 %
Lymphs Abs: 1.5 10*3/uL (ref 0.7–4.0)
MCH: 27.5 pg (ref 26.0–34.0)
MCHC: 30.6 g/dL (ref 30.0–36.0)
MCV: 90.1 fL (ref 80.0–100.0)
Monocytes Absolute: 0.6 10*3/uL (ref 0.1–1.0)
Monocytes Relative: 5 %
Neutro Abs: 10.7 10*3/uL — ABNORMAL HIGH (ref 1.7–7.7)
Neutrophils Relative %: 81 %
Platelets: 286 10*3/uL (ref 150–400)
RBC: 3.34 MIL/uL — ABNORMAL LOW (ref 3.87–5.11)
RDW: 15.6 % — ABNORMAL HIGH (ref 11.5–15.5)
WBC: 13.1 10*3/uL — ABNORMAL HIGH (ref 4.0–10.5)
nRBC: 0.2 % (ref 0.0–0.2)

## 2021-11-08 LAB — COMPREHENSIVE METABOLIC PANEL
ALT: 23 U/L (ref 0–44)
AST: 22 U/L (ref 15–41)
Albumin: 4 g/dL (ref 3.5–5.0)
Alkaline Phosphatase: 129 U/L — ABNORMAL HIGH (ref 38–126)
Anion gap: 18 — ABNORMAL HIGH (ref 5–15)
BUN: 120 mg/dL — ABNORMAL HIGH (ref 6–20)
CO2: 21 mmol/L — ABNORMAL LOW (ref 22–32)
Calcium: 8.5 mg/dL — ABNORMAL LOW (ref 8.9–10.3)
Chloride: 103 mmol/L (ref 98–111)
Creatinine, Ser: 17.29 mg/dL — ABNORMAL HIGH (ref 0.44–1.00)
GFR, Estimated: 2 mL/min — ABNORMAL LOW (ref >60)
Glucose, Bld: 231 mg/dL — ABNORMAL HIGH (ref 70–99)
Potassium: 6.2 mmol/L — ABNORMAL HIGH (ref 3.5–5.1)
Sodium: 142 mmol/L (ref 135–145)
Total Bilirubin: 0.9 mg/dL (ref 0.3–1.2)
Total Protein: 8.3 g/dL — ABNORMAL HIGH (ref 6.5–8.1)

## 2021-11-08 LAB — RESP PANEL BY RT-PCR (FLU A&B, COVID) ARPGX2
Influenza A by PCR: POSITIVE — AB
Influenza B by PCR: NEGATIVE
SARS Coronavirus 2 by RT PCR: NEGATIVE

## 2021-11-08 LAB — I-STAT CHEM 8, ED
BUN: 128 mg/dL — ABNORMAL HIGH (ref 6–20)
Calcium, Ion: 1 mmol/L — ABNORMAL LOW (ref 1.15–1.40)
Chloride: 108 mmol/L (ref 98–111)
Creatinine, Ser: >18 mg/dL — ABNORMAL HIGH (ref 0.44–1.00)
Glucose, Bld: 220 mg/dL — ABNORMAL HIGH (ref 70–99)
HCT: 28 % — ABNORMAL LOW (ref 36.0–46.0)
Hemoglobin: 9.5 g/dL — ABNORMAL LOW (ref 12.0–15.0)
Potassium: 6.1 mmol/L — ABNORMAL HIGH (ref 3.5–5.1)
Sodium: 143 mmol/L (ref 135–145)
TCO2: 23 mmol/L (ref 22–32)

## 2021-11-08 LAB — POTASSIUM: Potassium: 6 mmol/L — ABNORMAL HIGH (ref 3.5–5.1)

## 2021-11-08 LAB — LACTIC ACID, PLASMA: Lactic Acid, Venous: 2.3 mmol/L (ref 0.5–1.9)

## 2021-11-08 MED ORDER — DEXTROSE 50 % IV SOLN
1.0000 | Freq: Once | INTRAVENOUS | Status: AC
  Administered 2021-11-08: 21:00:00 50 mL via INTRAVENOUS
  Filled 2021-11-08: qty 50

## 2021-11-08 MED ORDER — SODIUM CHLORIDE 0.9 % IV SOLN
1.0000 g | Freq: Once | INTRAVENOUS | Status: AC
  Administered 2021-11-08: 21:00:00 1 g via INTRAVENOUS
  Filled 2021-11-08: qty 10

## 2021-11-08 MED ORDER — HYDRALAZINE HCL 20 MG/ML IJ SOLN
10.0000 mg | Freq: Once | INTRAMUSCULAR | Status: AC
  Administered 2021-11-08: 22:00:00 10 mg via INTRAVENOUS
  Filled 2021-11-08: qty 1

## 2021-11-08 MED ORDER — ONDANSETRON HCL 4 MG/2ML IJ SOLN
4.0000 mg | Freq: Once | INTRAMUSCULAR | Status: AC
  Administered 2021-11-08: 21:00:00 4 mg via INTRAVENOUS
  Filled 2021-11-08: qty 2

## 2021-11-08 MED ORDER — INSULIN ASPART 100 UNIT/ML IV SOLN
5.0000 [IU] | Freq: Once | INTRAVENOUS | Status: AC
  Administered 2021-11-08: 21:00:00 5 [IU] via INTRAVENOUS
  Filled 2021-11-08: qty 0.05

## 2021-11-08 MED ORDER — MEGESTROL ACETATE 40 MG PO TABS
40.0000 mg | ORAL_TABLET | Freq: Every day | ORAL | Status: DC
  Administered 2021-11-08 – 2021-11-11 (×4): 40 mg via ORAL
  Filled 2021-11-08 (×5): qty 1

## 2021-11-08 MED ORDER — NICARDIPINE HCL IN NACL 20-0.86 MG/200ML-% IV SOLN
3.0000 mg/h | INTRAVENOUS | Status: DC
  Administered 2021-11-09: 01:00:00 5 mg/h via INTRAVENOUS
  Filled 2021-11-08: qty 200

## 2021-11-08 MED ORDER — SODIUM CHLORIDE 0.9 % IV SOLN
500.0000 mg | Freq: Once | INTRAVENOUS | Status: AC
  Administered 2021-11-08: 22:00:00 500 mg via INTRAVENOUS
  Filled 2021-11-08: qty 5

## 2021-11-08 MED ORDER — SODIUM ZIRCONIUM CYCLOSILICATE 10 G PO PACK
10.0000 g | PACK | Freq: Every day | ORAL | Status: DC
  Administered 2021-11-09: 01:00:00 10 g via ORAL
  Filled 2021-11-08: qty 1

## 2021-11-08 MED ORDER — LACTATED RINGERS IV BOLUS
1000.0000 mL | Freq: Once | INTRAVENOUS | Status: AC
  Administered 2021-11-08: 22:00:00 1000 mL via INTRAVENOUS

## 2021-11-08 MED ORDER — CHLORHEXIDINE GLUCONATE CLOTH 2 % EX PADS
6.0000 | MEDICATED_PAD | Freq: Every day | CUTANEOUS | Status: DC
  Administered 2021-11-10 – 2021-11-11 (×2): 6 via TOPICAL

## 2021-11-08 NOTE — ED Provider Notes (Signed)
[facility]-EMERGENCY DEPT Provider Note   CSN: [id number] Arrival date & time: 11/08/21  1839     History Chief Complaint  Patient presents with   Cough   Shortness of Breath   Emesis    Jane [name] is a 45 y.o. female with PMH ESRD on dialysis Monday Wednesday Friday, CAD status post MI with DES placement, abnormal uterine bleeding on Megace who presents the emergency department for evaluation of cough, myalgias, shortness of breath.  Patient states that she has been feeling ill for the last 3 days but today she felt more short of breath than normal.  She states that she missed dialysis today because she was feeling "awful".  She currently endorses shortness of breath with associated nausea.  Denies chest pain, diarrhea, headache, fever or other systemic symptoms.  She arrives saturating 90% on room air.   Cough Associated symptoms: shortness of breath   Associated symptoms: no chest pain, no chills, no ear pain, no fever, no rash and no sore throat   Shortness of Breath Associated symptoms: cough and vomiting   Associated symptoms: no abdominal pain, no chest pain, no ear pain, no fever, no rash and no sore throat   Emesis Associated symptoms: cough   Associated symptoms: no abdominal pain, no arthralgias, no chills, no fever and no sore throat       Past Medical History:  Diagnosis Date   Abnormal uterine bleeding (AUB)    Asthma    prn inhaler   CAD S/P BMS PCI to prox LAD cardiologist--- dr Judithann Sauger LAD to Mid LAD lesion, 90% stenosed. Post intervention - Vision BMS 3.0 mm x 18 mm (~3.5 mm) there is a 0% residual stenosis. 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   left 2018; right 2019   Depression    Diabetic foot ulcer (HCC) 04/22/2019   Diabetic neuropathy (HCC)    feet   Diabetic retinopathy, nonproliferative, severe (HCC)    bilateral    ESRD (end stage renal disease) on dialysis [facility])    ?chronic interstitial nephritis  ([facility]   H/O non-ST elevation myocardial infarction (NSTEMI) 03/2016   Found in 90% mid LAD lesion treated with bare-metal stent (BMS) PCI - vision BMS 3.0 mm x 18 mm   History of MRSA infection 2007   Hyperlipidemia    Hypertension    IDA (iron deficiency anemia)    Insulin dependent type 2 diabetes mellitus (HCC)    Iron deficiency anemia    takes iron supplement   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   PAOD (peripheral arterial occlusive disease) (HCC)    vascular--- dr Imogene Burn   S/P arteriovenous (AV) fistula creation 07/2017   S/p bare metal coronary artery stent 03/31/2016   BMS x1  to pLAD   Sickle cell trait (HCC)    Ulcer of left foot due to type 2 diabetes mellitus (HCC) 06/03/2019    Patient Active Problem List   Diagnosis Date Noted   Pre-op evaluation 10/14/2021   Chest congestion 07/30/2021   Intractable nausea and vomiting 06/03/2021   Grief 04/01/2021   Diabetic neuropathy (HCC) 01/29/2021   Neck pain 01/07/2021   Hypertensive heart disease with chronic diastolic congestive heart failure (HCC) 01/06/2021   Left lateral abdominal pain 02/02/2020   Arthritis of left knee 02/11/2019  Hidradenitis suppurativa 02/12/2018   Anemia in chronic kidney disease 02/12/2018   Secondary hyperparathyroidism of renal origin (HCC) 01/30/2018   Coagulation defect, unspecified (HCC) 01/18/2018   Major depressive disorder 08/11/2017   Sinus tachycardia 02/23/2017   ESRD (end stage renal disease) on dialysis (HCC) 01/05/2017   CAD S/P BMS PCI to prox LAD 03/31/2016   Abnormal uterine bleeding (AUB) 03/17/2016   PAOD (peripheral arterial occlusive disease) (HCC) 01/08/2016   Charcot foot due to diabetes mellitus (HCC) 09/07/2015   Peripheral neuropathy 09/20/2014   Essential hypertension 08/16/2014   Seasonal allergic rhinitis 08/16/2014    Candidal vulvovaginitis 02/09/2014   Morbid obesity (HCC) 09/26/2012   Hyperlipidemia associated with type 2 diabetes mellitus (HCC) 02/26/2009   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, with long-term current use of insulin (HCC) 05/08/2007   Asthma 05/08/2007    Past Surgical History:  Procedure Laterality Date   A/V FISTULAGRAM N/A 04/13/2021   Procedure: A/V FISTULAGRAM - Right Arm;  Surgeon: Nada Libman, MD;  Location: [facility] INVASIVE [facility] LAB;  Service: Cardiovascular;  Laterality: N/A;   ACHILLES TENDON LENGTHENING Left 11/24/2016   Procedure: Left Achilles tendon lengthening (open);  Surgeon: Toni Arthurs, MD;  Location: [facility];  Service: Orthopedics;  Laterality: Left;   AV FISTULA PLACEMENT Right 07/31/2017   Procedure: ARTERIOVENOUS BRACHIOCEPHALIC FISTULA CREATION RIGHT ARM;  Surgeon: Fransisco Hertz, MD;  Location: [facility] OR;  Service: Vascular;  Laterality: Right;   CALCANEAL OSTEOTOMY Left 11/24/2016   Procedure: Left hindfoot osteotomy and fusion;  Surgeon: Toni Arthurs, MD;  Location: [facility];  Service: Orthopedics;  Laterality: Left;   CARDIAC CATHETERIZATION N/A 03/31/2016   Procedure: Left Heart Cath and Coronary Angiography;  Surgeon: Runell Gess, MD;  Location: [facility] INVASIVE CV LAB: 90% early mLAD, normal LV Fxn   CARDIAC CATHETERIZATION N/A 03/31/2016   Procedure: Coronary Stent Intervention;  Surgeon: Runell Gess, MD;  Location: [facility] INVASIVE CV LAB;  Service: Cardiovascular: PCI to mLAD BMS Vision 3.0 mm x 18 mm   CESAREAN SECTION  1997   DILITATION & CURRETTAGE/HYSTROSCOPY WITH NOVASURE ABLATION N/A 09/15/2020   Procedure: DILATATION & CURETTAGE/HYSTEROSCOPY;  Surgeon: Willodean Rosenthal, MD;  Location: [facility];  Service: Gynecology;  Laterality: N/A;   FISTULA SUPERFICIALIZATION Right 11/15/2017   Procedure: FISTULA SUPERFICIALIZATION RIGHT BRACHIOCEPHALIC  RIGHT;  Surgeon: Fransisco Hertz, MD;  Location: [facility] OR;  Service: Vascular;  Laterality: Right;   FOOT SURGERY     multiple for charcot   PERIPHERAL VASCULAR BALLOON ANGIOPLASTY Right 04/13/2021   Procedure: PERIPHERAL VASCULAR BALLOON ANGIOPLASTY;  Surgeon: Nada Libman, MD;  Location: [facility] IN[facility]AS[facility];  Service: Cardiovascular;  Laterality: Right;  arm fistula   TUBAL LIGATION  1997     OB History     Gravida  2   Para  2   Term  1   Preterm  1   AB  0   Living  2      SAB  0   IAB  0   Ectopic  0   Multiple  0   Live Births              Family History  Problem Relation Age of Onset   Stroke Mother    Diabetes Mother    Hypertension Mother    Aneurysm Mother    Diabetes Father    Hypertension Father    Diabetes Sister  Hypertension Sister    Diabetes Maternal Grandmother    Diabetes Maternal Grandfather    Cancer Paternal Grandfather        Prostate    Social History   Tobacco Use   Smoking status: Former    Packs/day: 0.00    Years: 0.50    Pack years: 0.00    Types: Cigarettes    Quit date: 08/29/2015    Years since quitting: 6.2   Smokeless tobacco: Never  Vaping Use   Vaping Use: Never used  Substance Use Topics   Alcohol use: Yes    Alcohol/week: 0.0 standard drinks    Comment: occasional   Drug use: Yes    Types: Marijuana    Comment: 09-08-2020 per pt seldom smokes    Home Medications Prior to Admission medications   Medication Sig Start Date End Date Taking? Authorizing Provider  Accu-Chek Softclix Lancets lancets Check blood sugar up to 3 times  day 02/14/21   Mosetta Anis, MD  acetaminophen (TYLENOL) 500 MG tablet Take 1,000 mg by mouth every 6 (six) hours as needed (for pain.).    [provider]  albuterol (PROVENTIL HFA) 108 (90 Base) MCG/ACT inhaler INHALE 2 PUFFS BY MOUTH EVERY 4 HOURS AS NEEDED FOR COUGHING, WHEEZING, OR SHORTNESS OF BREATH Patient taking differently: Inhale 2 puffs into the lungs every 4 (four) hours as needed for  wheezing or shortness of breath. INHALE 2 PUFFS BY MOUTH EVERY 4 HOURS AS NEEDED FOR COUGHING, WHEEZING, OR SHORTNESS OF BREATH 01/29/20   [name], MD  amoxicillin (AMOXIL) 500 MG capsule Take 1 capsule (500 mg total) by mouth every 8 (eight) hours until gone 10/05/21     blood glucose meter kit and supplies Dispense based on patient and insurance preference. Use up to four times daily as directed. (FOR ICD-10 E10.9, E11.9). 01/29/21   Sanjuan Dame, MD  Blood Glucose Monitoring Suppl (ACCU-CHEK GUIDE) w/Device KIT 1 each by Does not apply route 3 (three) times daily. 05/18/17   Alphonzo Grieve, MD  Blood Pressure Monitor MISC USE AS DIRECTED FOUR TIMES DAILY. 01/29/21 01/29/22  Sanjuan Dame, MD  cetirizine (ZYRTEC) 5 MG tablet Take 1 tablet (5 mg total) by mouth daily. 07/30/21 08/29/21  [name], MD  Cholecalciferol (VITAMIN D3) 50 MCG (2000 UT) CAPS Take 4 capsules (8,000 Units total) by mouth daily. 02/24/21     Continuous Blood Gluc Receiver (DEXCOM G6 RECEIVER) DEVI Use to check blood sugar at least 6 times a day 02/25/21   [name], MD  Continuous Blood Gluc Sensor (DEXCOM G6 SENSOR) MISC Use to check blood sugar at least 6 times a day 07/20/21   [name], MD  Continuous Blood Gluc Transmit (DEXCOM G6 TRANSMITTER) MISC Use to check blood sugar at least 6 times a day 07/20/21   [name], MD  diclofenac Sodium (VOLTAREN) 1 % GEL APPLY 4 G TOPICALLY 4 (FOUR) TIMES DAILY. 01/07/21 01/07/22  Madalyn Rob, MD  fluticasone [facility]) 50 MCG/ACT nasal spray Place 2 sprays into both nostrils every night 07/06/21     gabapentin (NEURONTIN) 100 MG capsule Take 1 capsule (100 mg total) by mouth at bedtime. 10/27/21   [name], MD  glucose blood (ACCU-CHEK GUIDE) test strip Check blood sugar up to 3 times  day 02/14/21   Mosetta Anis, MD  ibuprofen (ADVIL) 800 MG tablet Take 1 tablet (800 mg total) by mouth every 6 (six) hours as needed for pain 10/26/21     icosapent  Ethyl  (VASCEPA) 1 g capsule Take 2 capsules (2 g total) by mouth 2 (two) times daily. 07/27/21   Loel Dubonnet, NP  injection device for insulin (INPEN 100-PINK-LILLY-HUMALOG) DEVI Use to inject Humalog insulin for meals and correction insulin 03/17/21   Madalyn Rob, MD  insulin degludec (TRESIBA) 100 UNIT/ML FlexTouch Pen Inject 24 Units into the skin daily. 06/03/21   [name], MD  insulin lispro (HUMALOG) 100 UNIT/ML cartridge Use with inpen to inject at meal time and for correction insulin up to 40 units daily 06/04/21   [name], MD  Insulin Pen Needle 32G X 4 MM MISC Use to inject insulin 4 times a day 02/14/21   Mosetta Anis, MD  Insulin Syringe-Needle U-100 31G X 15/64" 0.3 ML MISC Use to inject insulin daily 08/17/18   Aldine Contes, MD  lanthanum (FOSRENOL) 1000 MG chewable tablet Chew 1 tablet (1,000 mg total) by mouth 3 (three) times daily with meals. 08/11/21     megestrol (MEGACE) 40 MG tablet Take 1 tablet (40 mg total) by mouth daily. Can increase to two tablets twice a day in the event of heavy bleeding 10/12/21   Lavonia Drafts, MD  metoCLOPramide (REGLAN) 10 MG tablet Take 1 tablet (10 mg total) by mouth every 8 (eight) hours as needed for nausea. 08/27/21 09/26/21  Virl Axe, MD  metoprolol tartrate (LOPRESSOR) 25 MG tablet Take 1 tablet (25 mg total) by mouth 2 (two) times daily. Do not take the night before dialysis, and take 2 hours after dialysis. 04/06/21   [name], MD  multivitamin (RENA-VIT) TABS tablet Take 1 tablet by mouth daily. 04/30/18   [provider]  naloxone [facility]) nasal spray 4 mg/0.1 mL Place 1 spray into the nose as needed, if found unresponsive then spray into nose and call 911. 02/24/21     oxyCODONE-acetaminophen (PERCOCET) 10-325 MG tablet Take 1 tablet by mouth 5 (five) times daily as needed. Do not fill before 09/25/21 09/24/21     oxyCODONE-acetaminophen (PERCOCET) 10-325 MG tablet Take 1 tablet by mouth 5 (five)  times daily as needed 10/24/21     rosuvastatin (CRESTOR) 40 MG tablet Take 1 tablet (40 mg total) by mouth daily. 07/27/21   Loel Dubonnet, NP  senna-docusate (SENNA-S) 8.6-50 MG tablet Take 1 tablet by mouth 2 (two) times daily. Patient not taking: Reported on 10/06/2021 08/27/21   Virl Axe, MD  sertraline (ZOLOFT) 100 MG tablet Take 0.5 tablets (50 mg total) by mouth daily. 04/01/21   [name], MD  sevelamer carbonate (RENVELA) 800 MG tablet Take 3,200 mg by mouth 3 (three) times daily with meals. Take 800 mg with snacks as needed    [provider]  Sodium Fluoride 1.1 % PSTE Brush teeth with small amount 2 times a day 10/05/21       Allergies    Adhesive [tape]  Review of Systems   Review of Systems  Constitutional:  Negative for chills and fever.  HENT:  Negative for ear pain and sore throat.   Eyes:  Negative for pain and visual disturbance.  Respiratory:  Positive for cough and shortness of breath.   Cardiovascular:  Negative for chest pain and palpitations.  Gastrointestinal:  Positive for vomiting. Negative for abdominal pain.  Genitourinary:  Negative for dysuria and hematuria.  Musculoskeletal:  Negative for arthralgias and back pain.  Skin:  Negative for color change and rash.  Neurological:  Negative for seizures and syncope.  All other  systems reviewed and are negative.  Physical Exam Updated Vital Signs BP (!) 213/118 (BP Location: Left Arm)    Pulse (!) 118    Temp 98.3 F (36.8 C) (Oral)    Resp 18    Ht $R'5\' 5"'nn$  (1.651 m)    Wt 102.1 kg    SpO2 96%    BMI 37.44 kg/m   Physical Exam Vitals and nursing note reviewed.  Constitutional:      General: She is not in acute distress.    Appearance: She is well-developed.  HENT:     Head: Normocephalic and atraumatic.  Eyes:     Conjunctiva/sclera: Conjunctivae normal.  Cardiovascular:     Rate and Rhythm: Normal rate and regular rhythm.     Heart sounds: No murmur heard. Pulmonary:      Effort: Pulmonary effort is normal. No respiratory distress.     Breath sounds: Examination of the right-upper field reveals rales. Examination of the right-middle field reveals rales. Examination of the right-lower field reveals rales. Rales present.  Abdominal:     Palpations: Abdomen is soft.     Tenderness: There is no abdominal tenderness.  Musculoskeletal:        General: No swelling.     Cervical back: Neck supple.  Skin:    General: Skin is warm and dry.     Capillary Refill: Capillary refill takes less than 2 seconds.  Neurological:     Mental Status: She is alert.  Psychiatric:        Mood and Affect: Mood normal.    ED Results / Procedures / Treatments   Labs (all labs ordered are listed, but only abnormal results are displayed) Labs Reviewed  COMPREHENSIVE METABOLIC PANEL - Abnormal; Notable for the following components:      Result Value   Potassium 6.2 (*)    CO2 21 (*)    Glucose, Bld 231 (*)    Creatinine, Ser 17.29 (*)    Calcium 8.5 (*)    Total Protein 8.3 (*)    Alkaline Phosphatase 129 (*)    GFR, Estimated 2 (*)    Anion gap 18 (*)    All other components within normal limits  CBC WITH DIFFERENTIAL/PLATELET - Abnormal; Notable for the following components:   WBC 13.1 (*)    RBC 3.34 (*)    Hemoglobin 9.2 (*)    HCT 30.1 (*)    RDW 15.6 (*)    Neutro Abs 10.7 (*)    Abs Immature Granulocytes 0.08 (*)    All other components within normal limits  I-STAT CHEM 8, ED - Abnormal; Notable for the following components:   Potassium 6.1 (*)    BUN 128 (*)    Creatinine, Ser >18.00 (*)    Glucose, Bld 220 (*)    Calcium, Ion 1.00 (*)    Hemoglobin 9.5 (*)    HCT 28.0 (*)    All other components within normal limits  RESP PANEL BY RT-PCR (FLU A&B, COVID) [id number]    EKG None  Radiology DG Chest 2 View  Result Date: 11/08/2021 CLINICAL DATA:  Short of breath EXAM: CHEST - 2 VIEW COMPARISON:  05/26/2021 FINDINGS: Patchy airspace opacity in the right  thorax and left base. No pleural effusion. Stable cardiomediastinal silhouette. No pneumothorax. IMPRESSION: Patchy airspace opacity in the right greater than left chest, favored to represent pneumonia over asymmetric edema. Electronically Signed   By: Donavan Foil M.D.   On: 11/08/2021 19:36  Procedures .Critical Care Performed by: Teressa Lower, MD Authorized by: Teressa Lower, MD   Critical care provider statement:    Critical care time (minutes):  30   Critical care was necessary to treat or prevent imminent or life-threatening deterioration of the following conditions:  Respiratory failure   Critical care was time spent personally by me on the following activities:  Development of treatment plan with patient or surrogate, discussions with consultants, evaluation of patient's response to treatment, examination of patient, ordering and review of laboratory studies, ordering and review of radiographic studies, ordering and performing treatments and interventions, pulse oximetry, re-evaluation of patient's condition and review of old charts   Medications Ordered in ED Medications  cefTRIAXone (ROCEPHIN) 1 g in sodium chloride 0.9 % 100 mL IVPB (has no administration in time range)  azithromycin (ZITHROMAX) 500 mg in sodium chloride 0.9 % 250 mL IVPB (has no administration in time range)    ED Course  I have reviewed the triage vital signs and the nursing notes.  Pertinent labs & imaging results that were available during my care of the patient were reviewed by me and considered in my medical decision making (see chart for details).    MDM Rules/Calculators/A&P                          Patient seen emergency department for evaluation of cough and shortness of breath.  Physical exam reveals rales in the right lung but is otherwise unremarkable.  Laboratory evaluation with a hyperkalemia to 6.2, glucose 231, creatinine 17.29, BUN 120 consistent with her ESRD not receiving dialysis  over the last 3 days.  CBC with leukocytosis to 13.1, hemoglobin 9.2.  Patient is also influenza positive.  Chest x-ray with right-sided pneumonia.  Patient started on ceftriaxone azithromycin and I spoke with nephrology who will run the patient routinely on dialysis tomorrow at [facility].  Hyperkalemia treated with insulin glucose and the patient was admitted to medicine.   Final Clinical Impression(s) / ED Diagnoses Final diagnoses:  None    Rx / DC Orders ED Discharge Orders     None        Merrick Maggio, Debe Coder, MD 11/08/21 2148

## 2021-11-08 NOTE — ED Provider Notes (Addendum)
Emergency Medicine Provider Triage Evaluation Note  Jane [name] , a 45 y.o. female  was evaluated in triage.  Pt complains of cough, shortness of breath, rhinorrhea, nasal congestion, chest pain.  Patient reports that she is a dialysis patient and goes Monday, Wednesday, and Friday.  Patient's last dialysis was Wednesday 11/03/2021.  States that she had rhinorrhea and nasal congestion while in [place] over the last 3 days.  Patient woke up this morning with productive cough.  States that she also was having shortness of breath and her chest was "aching."  Chest pain is present with cough and reproduced with palpation.  Patient reports that she has not had any of her medications today.  Review of Systems  Positive: Cough, shortness of breath, rhinorrhea, nasal congestion, chest pain,  Negative: Chills, abdominal pain, nausea, vomiting, palpitation, syncope, leg swelling or tenderness  Physical Exam  BP (!) 213/118 (BP Location: Left Arm)    Pulse (!) 118    Temp 98.3 F (36.8 C) (Oral)    Resp 18    Ht 5\' 5"  (1.651 m)    Wt 102.1 kg    SpO2 96%    BMI 37.44 kg/m  Gen:   Awake, no distress   Resp:  Normal effort, coarse lung sounds throughout right lung MSK:   Moves extremities without difficulty; no swelling or tenderness to bilateral lower extremities Other:  Chest pain reproduced on palpation  Medical Decision Making  Medically screening exam initiated at 7:17 PM.  Appropriate orders placed.  Gabriell L Nesler was informed that the remainder of the evaluation will be completed by another provider, this initial triage assessment does not replace that evaluation, and the importance of remaining in the ED until their evaluation is complete.  Patient oxygen saturation 88% on room air.  Patient denies any home oxygen use.  Patient will be placed on oxygen and moved back to next available room.   Loni Beckwith, PA-C 11/08/21 1920    Loni Beckwith, PA-C 11/08/21 1922     Jeanell Sparrow, DO 11/10/21 0018

## 2021-11-08 NOTE — ED Triage Notes (Signed)
Pt reports with cough, SHOB, and emesis since earlier today. Pt states that she missed her dialysis appt and feels that the fluid may be causing her to feel this way.

## 2021-11-08 NOTE — ED Notes (Signed)
Attempted to wean pt off supplemental oxygen. Saturation dropped to 90%. 2L [place] reapplied.

## 2021-11-08 NOTE — Progress Notes (Addendum)
Nephrology Brief note: 45 y/o F ESRD onHD admitted with flu/pneumonia and hypoxia. Pt is known to me. Labs with K 6.2 max. OP HD orders: [facility], MWF, 4 hours EDW 101 kg, BFR 400/[employer]oflow RIJ AVF  for the access.  Recommendation: Treat hyperkalemia medically; dextrose, insulin, lokelma etc. Patient is still at [facility] ER. Admit to [facility] for HD. I will order HD for tomorrow am. Resume antihypertensive meds for hypertension. Management of pneumonia/flu as per medical team. D/w ER physician. Full consult to follow.

## 2021-11-08 NOTE — ED Notes (Addendum)
I-stat Chem 8 Results reported to [place], MD

## 2021-11-09 ENCOUNTER — Encounter (HOSPITAL_COMMUNITY): Payer: Self-pay | Admitting: Family Medicine

## 2021-11-09 DIAGNOSIS — E872 Acidosis, unspecified: Secondary | ICD-10-CM

## 2021-11-09 DIAGNOSIS — R6520 Severe sepsis without septic shock: Secondary | ICD-10-CM

## 2021-11-09 DIAGNOSIS — I160 Hypertensive urgency: Secondary | ICD-10-CM

## 2021-11-09 DIAGNOSIS — J101 Influenza due to other identified influenza virus with other respiratory manifestations: Secondary | ICD-10-CM

## 2021-11-09 DIAGNOSIS — A419 Sepsis, unspecified organism: Secondary | ICD-10-CM

## 2021-11-09 DIAGNOSIS — Z992 Dependence on renal dialysis: Secondary | ICD-10-CM

## 2021-11-09 DIAGNOSIS — E875 Hyperkalemia: Secondary | ICD-10-CM

## 2021-11-09 DIAGNOSIS — J9601 Acute respiratory failure with hypoxia: Secondary | ICD-10-CM

## 2021-11-09 DIAGNOSIS — J189 Pneumonia, unspecified organism: Secondary | ICD-10-CM

## 2021-11-09 DIAGNOSIS — N186 End stage renal disease: Secondary | ICD-10-CM

## 2021-11-09 LAB — CBC
HCT: 26.2 % — ABNORMAL LOW (ref 36.0–46.0)
HCT: 25.7 % — ABNORMAL LOW (ref 36.0–46.0)
Hemoglobin: 8 g/dL — ABNORMAL LOW (ref 12.0–15.0)
Hemoglobin: 7.9 g/dL — ABNORMAL LOW (ref 12.0–15.0)
MCH: 28 pg (ref 26.0–34.0)
MCH: 27.7 pg (ref 26.0–34.0)
MCHC: 30.5 g/dL (ref 30.0–36.0)
MCHC: 30.7 g/dL (ref 30.0–36.0)
MCV: 91.6 fL (ref 80.0–100.0)
MCV: 90.2 fL (ref 80.0–100.0)
Platelets: 265 10*3/uL (ref 150–400)
Platelets: 266 10*3/uL (ref 150–400)
RBC: 2.86 MIL/uL — ABNORMAL LOW (ref 3.87–5.11)
RBC: 2.85 MIL/uL — ABNORMAL LOW (ref 3.87–5.11)
RDW: 15.8 % — ABNORMAL HIGH (ref 11.5–15.5)
RDW: 15.8 % — ABNORMAL HIGH (ref 11.5–15.5)
WBC: 12.9 10*3/uL — ABNORMAL HIGH (ref 4.0–10.5)
WBC: 12.1 10*3/uL — ABNORMAL HIGH (ref 4.0–10.5)
nRBC: 0 % (ref 0.0–0.2)
nRBC: 0 % (ref 0.0–0.2)

## 2021-11-09 LAB — HIV ANTIBODY (ROUTINE TESTING W REFLEX): HIV Screen 4th Generation wRfx: NONREACTIVE

## 2021-11-09 LAB — HEMOGLOBIN A1C
Hgb A1c MFr Bld: 9.2 % — ABNORMAL HIGH (ref 4.8–5.6)
Mean Plasma Glucose: 217.34 mg/dL

## 2021-11-09 LAB — GLUCOSE, CAPILLARY
Glucose-Capillary: 368 mg/dL — ABNORMAL HIGH (ref 70–99)
Glucose-Capillary: 419 mg/dL — ABNORMAL HIGH (ref 70–99)
Glucose-Capillary: 150 mg/dL — ABNORMAL HIGH (ref 70–99)
Glucose-Capillary: 177 mg/dL — ABNORMAL HIGH (ref 70–99)

## 2021-11-09 LAB — RENAL FUNCTION PANEL
Albumin: 3.5 g/dL (ref 3.5–5.0)
Albumin: 3.3 g/dL — ABNORMAL LOW (ref 3.5–5.0)
Anion gap: 20 — ABNORMAL HIGH (ref 5–15)
Anion gap: 18 — ABNORMAL HIGH (ref 5–15)
BUN: 117 mg/dL — ABNORMAL HIGH (ref 6–20)
BUN: 37 mg/dL — ABNORMAL HIGH (ref 6–20)
CO2: 18 mmol/L — ABNORMAL LOW (ref 22–32)
CO2: 22 mmol/L (ref 22–32)
Calcium: 8.1 mg/dL — ABNORMAL LOW (ref 8.9–10.3)
Calcium: 7.7 mg/dL — ABNORMAL LOW (ref 8.9–10.3)
Chloride: 101 mmol/L (ref 98–111)
Chloride: 94 mmol/L — ABNORMAL LOW (ref 98–111)
Creatinine, Ser: 19.07 mg/dL — ABNORMAL HIGH (ref 0.44–1.00)
Creatinine, Ser: 7.93 mg/dL — ABNORMAL HIGH (ref 0.44–1.00)
GFR, Estimated: 2 mL/min — ABNORMAL LOW (ref >60)
GFR, Estimated: 6 mL/min — ABNORMAL LOW (ref >60)
Glucose, Bld: 398 mg/dL — ABNORMAL HIGH (ref 70–99)
Glucose, Bld: 163 mg/dL — ABNORMAL HIGH (ref 70–99)
Phosphorus: 7.2 mg/dL — ABNORMAL HIGH (ref 2.5–4.6)
Phosphorus: 4.2 mg/dL (ref 2.5–4.6)
Potassium: 7 mmol/L (ref 3.5–5.1)
Potassium: 4.1 mmol/L (ref 3.5–5.1)
Sodium: 139 mmol/L (ref 135–145)
Sodium: 134 mmol/L — ABNORMAL LOW (ref 135–145)

## 2021-11-09 LAB — BASIC METABOLIC PANEL
Anion gap: 20 — ABNORMAL HIGH (ref 5–15)
BUN: 116 mg/dL — ABNORMAL HIGH (ref 6–20)
CO2: 18 mmol/L — ABNORMAL LOW (ref 22–32)
Calcium: 8.1 mg/dL — ABNORMAL LOW (ref 8.9–10.3)
Chloride: 103 mmol/L (ref 98–111)
Creatinine, Ser: 17.73 mg/dL — ABNORMAL HIGH (ref 0.44–1.00)
GFR, Estimated: 2 mL/min — ABNORMAL LOW (ref >60)
Glucose, Bld: 368 mg/dL — ABNORMAL HIGH (ref 70–99)
Potassium: 7.3 mmol/L (ref 3.5–5.1)
Sodium: 141 mmol/L (ref 135–145)

## 2021-11-09 LAB — HEPATITIS B SURFACE ANTIGEN: Hepatitis B Surface Ag: NONREACTIVE

## 2021-11-09 LAB — LACTIC ACID, PLASMA: Lactic Acid, Venous: 2.4 mmol/L (ref 0.5–1.9)

## 2021-11-09 LAB — HEPATITIS B SURFACE ANTIBODY,QUALITATIVE: Hep B S Ab: REACTIVE — AB

## 2021-11-09 MED ORDER — VITAMIN D 25 MCG (1000 UNIT) PO TABS
8000.0000 [IU] | ORAL_TABLET | Freq: Every day | ORAL | Status: DC
  Administered 2021-11-09 – 2021-11-11 (×3): 8000 [IU] via ORAL
  Filled 2021-11-09 (×4): qty 8

## 2021-11-09 MED ORDER — IPRATROPIUM BROMIDE 0.02 % IN SOLN
0.5000 mg | Freq: Three times a day (TID) | RESPIRATORY_TRACT | Status: DC
  Administered 2021-11-10: 09:00:00 0.5 mg via RESPIRATORY_TRACT
  Filled 2021-11-09: qty 2.5

## 2021-11-09 MED ORDER — FLUTICASONE PROPIONATE 50 MCG/ACT NA SUSP
2.0000 | Freq: Every day | NASAL | Status: DC
  Administered 2021-11-09 – 2021-11-11 (×3): 2 via NASAL
  Filled 2021-11-09: qty 16

## 2021-11-09 MED ORDER — LEVALBUTEROL HCL 0.63 MG/3ML IN NEBU
0.6300 mg | INHALATION_SOLUTION | Freq: Three times a day (TID) | RESPIRATORY_TRACT | Status: DC
Start: 2021-11-10 — End: 2021-11-10
  Administered 2021-11-10: 09:00:00 0.63 mg via RESPIRATORY_TRACT
  Filled 2021-11-09: qty 3

## 2021-11-09 MED ORDER — OXYCODONE HCL 5 MG PO TABS
5.0000 mg | ORAL_TABLET | Freq: Four times a day (QID) | ORAL | Status: DC | PRN
  Administered 2021-11-10: 19:00:00 5 mg via ORAL
  Filled 2021-11-09: qty 1

## 2021-11-09 MED ORDER — HYDROCODONE BIT-HOMATROP MBR 5-1.5 MG/5ML PO SOLN
5.0000 mL | Freq: Four times a day (QID) | ORAL | Status: DC | PRN
  Administered 2021-11-09 – 2021-11-10 (×2): 5 mL via ORAL
  Filled 2021-11-09 (×2): qty 5

## 2021-11-09 MED ORDER — CALCIUM GLUCONATE-NACL 1-0.675 GM/50ML-% IV SOLN
1.0000 g | Freq: Once | INTRAVENOUS | Status: AC
  Administered 2021-11-09: 12:00:00 1000 mg via INTRAVENOUS
  Filled 2021-11-09: qty 50

## 2021-11-09 MED ORDER — HEPARIN SODIUM (PORCINE) 1000 UNIT/ML DIALYSIS: 1000.0000 [IU] | INTRAMUSCULAR | Status: DC | PRN

## 2021-11-09 MED ORDER — DEXTROSE 50 % IV SOLN
1.0000 | Freq: Once | INTRAVENOUS | Status: DC
  Filled 2021-11-09: qty 50

## 2021-11-09 MED ORDER — INSULIN GLARGINE-YFGN 100 UNIT/ML ~~LOC~~ SOLN
10.0000 [IU] | Freq: Once | SUBCUTANEOUS | Status: AC
  Administered 2021-11-09: 22:00:00 10 [IU] via SUBCUTANEOUS
  Filled 2021-11-09: qty 0.1

## 2021-11-09 MED ORDER — PENTAFLUOROPROP-TETRAFLUOROETH EX AERO: 1.0000 "application " | INHALATION_SPRAY | CUTANEOUS | Status: DC | PRN

## 2021-11-09 MED ORDER — INSULIN ASPART 100 UNIT/ML IJ SOLN
0.0000 [IU] | Freq: Three times a day (TID) | INTRAMUSCULAR | Status: DC
  Administered 2021-11-09: 20:00:00 2 [IU] via SUBCUTANEOUS
  Administered 2021-11-10: 06:00:00 5 [IU] via SUBCUTANEOUS
  Administered 2021-11-10: 12:00:00 2 [IU] via SUBCUTANEOUS

## 2021-11-09 MED ORDER — FAMOTIDINE 20 MG PO TABS
20.0000 mg | ORAL_TABLET | Freq: Every day | ORAL | Status: DC
  Administered 2021-11-09 – 2021-11-11 (×3): 20 mg via ORAL
  Filled 2021-11-09 (×4): qty 1

## 2021-11-09 MED ORDER — SODIUM CHLORIDE 0.9 % IV SOLN
500.0000 mg | INTRAVENOUS | Status: DC
  Administered 2021-11-09 – 2021-11-10 (×2): 500 mg via INTRAVENOUS
  Filled 2021-11-09 (×2): qty 5

## 2021-11-09 MED ORDER — INSULIN GLARGINE-YFGN 100 UNIT/ML ~~LOC~~ SOLN
5.0000 [IU] | Freq: Every day | SUBCUTANEOUS | Status: DC
Start: 2021-11-09 — End: 2021-11-09
  Administered 2021-11-09: 11:00:00 5 [IU] via SUBCUTANEOUS
  Filled 2021-11-09: qty 0.05

## 2021-11-09 MED ORDER — LORATADINE 10 MG PO TABS
10.0000 mg | ORAL_TABLET | Freq: Every day | ORAL | Status: DC
  Administered 2021-11-09 – 2021-11-11 (×3): 10 mg via ORAL
  Filled 2021-11-09 (×4): qty 1

## 2021-11-09 MED ORDER — SODIUM CHLORIDE 0.9 % IV SOLN
1.0000 g | INTRAVENOUS | Status: DC
  Administered 2021-11-09 – 2021-11-10 (×2): 1 g via INTRAVENOUS
  Filled 2021-11-09 (×2): qty 10

## 2021-11-09 MED ORDER — LEVALBUTEROL HCL 0.63 MG/3ML IN NEBU
0.6300 mg | INHALATION_SOLUTION | Freq: Three times a day (TID) | RESPIRATORY_TRACT | Status: DC
  Administered 2021-11-09: 21:00:00 0.63 mg via RESPIRATORY_TRACT
  Filled 2021-11-09: qty 3

## 2021-11-09 MED ORDER — METOPROLOL TARTRATE 5 MG/5ML IV SOLN
5.0000 mg | Freq: Four times a day (QID) | INTRAVENOUS | Status: AC
  Administered 2021-11-10: 01:00:00 5 mg via INTRAVENOUS
  Filled 2021-11-09: qty 5

## 2021-11-09 MED ORDER — OXYCODONE-ACETAMINOPHEN 5-325 MG PO TABS
1.0000 | ORAL_TABLET | Freq: Four times a day (QID) | ORAL | Status: DC | PRN
  Administered 2021-11-11: 08:00:00 1 via ORAL
  Filled 2021-11-09: qty 1

## 2021-11-09 MED ORDER — METOPROLOL TARTRATE 5 MG/5ML IV SOLN: 5.0000 mg | Freq: Four times a day (QID) | INTRAVENOUS | Status: DC

## 2021-11-09 MED ORDER — METOPROLOL TARTRATE 25 MG PO TABS
25.0000 mg | ORAL_TABLET | Freq: Two times a day (BID) | ORAL | Status: DC
  Administered 2021-11-10 – 2021-11-11 (×2): 25 mg via ORAL
  Filled 2021-11-09 (×4): qty 1

## 2021-11-09 MED ORDER — ALBUTEROL SULFATE (2.5 MG/3ML) 0.083% IN NEBU: 3.0000 mL | INHALATION_SOLUTION | RESPIRATORY_TRACT | Status: DC | PRN

## 2021-11-09 MED ORDER — RENA-VITE PO TABS
1.0000 | ORAL_TABLET | Freq: Every day | ORAL | Status: DC
  Administered 2021-11-09 – 2021-11-10 (×2): 1 via ORAL
  Filled 2021-11-09 (×2): qty 1

## 2021-11-09 MED ORDER — OSELTAMIVIR PHOSPHATE 30 MG PO CAPS
30.0000 mg | ORAL_CAPSULE | Freq: Once | ORAL | Status: AC
  Administered 2021-11-09: 01:00:00 30 mg via ORAL
  Filled 2021-11-09: qty 1

## 2021-11-09 MED ORDER — IPRATROPIUM BROMIDE 0.02 % IN SOLN
0.5000 mg | Freq: Three times a day (TID) | RESPIRATORY_TRACT | Status: DC
  Administered 2021-11-09: 21:00:00 0.5 mg via RESPIRATORY_TRACT
  Filled 2021-11-09: qty 2.5

## 2021-11-09 MED ORDER — ICOSAPENT ETHYL 1 G PO CAPS
2.0000 g | ORAL_CAPSULE | Freq: Two times a day (BID) | ORAL | Status: DC
  Administered 2021-11-09 – 2021-11-11 (×5): 2 g via ORAL
  Filled 2021-11-09 (×6): qty 2

## 2021-11-09 MED ORDER — ONDANSETRON HCL 4 MG/2ML IJ SOLN
4.0000 mg | Freq: Four times a day (QID) | INTRAMUSCULAR | Status: DC | PRN
  Administered 2021-11-09 – 2021-11-10 (×5): 4 mg via INTRAVENOUS
  Filled 2021-11-09 (×5): qty 2

## 2021-11-09 MED ORDER — GUAIFENESIN ER 600 MG PO TB12
1200.0000 mg | ORAL_TABLET | Freq: Two times a day (BID) | ORAL | Status: DC
  Administered 2021-11-09 – 2021-11-11 (×5): 1200 mg via ORAL
  Filled 2021-11-09 (×6): qty 2

## 2021-11-09 MED ORDER — SODIUM CHLORIDE 0.9 % IV SOLN: 100.0000 mL | INTRAVENOUS | Status: DC | PRN

## 2021-11-09 MED ORDER — INSULIN ASPART 100 UNIT/ML IV SOLN
5.0000 [IU] | Freq: Once | INTRAVENOUS | Status: AC
  Administered 2021-11-09: 12:00:00 5 [IU] via INTRAVENOUS
  Filled 2021-11-09: qty 0.05

## 2021-11-09 MED ORDER — ALTEPLASE 2 MG IJ SOLR: 2.0000 mg | Freq: Once | INTRAMUSCULAR | Status: DC | PRN

## 2021-11-09 MED ORDER — LABETALOL HCL 5 MG/ML IV SOLN: 10.0000 mg | INTRAVENOUS | Status: AC | PRN

## 2021-11-09 MED ORDER — OSELTAMIVIR PHOSPHATE 30 MG PO CAPS
30.0000 mg | ORAL_CAPSULE | ORAL | Status: DC | PRN
  Filled 2021-11-09: qty 1

## 2021-11-09 MED ORDER — HYDROCOD POLST-CPM POLST ER 10-8 MG/5ML PO SUER
5.0000 mL | Freq: Once | ORAL | Status: AC
  Administered 2021-11-09: 5 mL via ORAL
  Filled 2021-11-09: qty 5

## 2021-11-09 MED ORDER — OSELTAMIVIR PHOSPHATE 30 MG PO CAPS
30.0000 mg | ORAL_CAPSULE | ORAL | Status: DC
  Administered 2021-11-09 – 2021-11-10 (×2): 30 mg via ORAL
  Filled 2021-11-09 (×2): qty 1

## 2021-11-09 MED ORDER — INSULIN ASPART 100 UNIT/ML IJ SOLN
5.0000 [IU] | Freq: Once | INTRAMUSCULAR | Status: AC
  Administered 2021-11-09: 13:00:00 5 [IU] via SUBCUTANEOUS

## 2021-11-09 MED ORDER — HEPARIN SODIUM (PORCINE) 5000 UNIT/ML IJ SOLN
5000.0000 [IU] | Freq: Three times a day (TID) | INTRAMUSCULAR | Status: DC
  Administered 2021-11-09 – 2021-11-11 (×5): 5000 [IU] via SUBCUTANEOUS
  Filled 2021-11-09 (×5): qty 1

## 2021-11-09 MED ORDER — IBUPROFEN 600 MG PO TABS
800.0000 mg | ORAL_TABLET | Freq: Four times a day (QID) | ORAL | Status: DC | PRN
  Administered 2021-11-09 – 2021-11-10 (×2): 800 mg via ORAL
  Filled 2021-11-09 (×2): qty 1

## 2021-11-09 MED ORDER — SEVELAMER CARBONATE 800 MG PO TABS
3200.0000 mg | ORAL_TABLET | Freq: Three times a day (TID) | ORAL | Status: DC
  Administered 2021-11-09 – 2021-11-11 (×5): 3200 mg via ORAL
  Filled 2021-11-09 (×5): qty 4

## 2021-11-09 MED ORDER — SODIUM ZIRCONIUM CYCLOSILICATE 10 G PO PACK
10.0000 g | PACK | ORAL | Status: AC
  Administered 2021-11-09: 10:00:00 10 g via ORAL
  Filled 2021-11-09: qty 1

## 2021-11-09 MED ORDER — LIDOCAINE-PRILOCAINE 2.5-2.5 % EX CREA: 1.0000 "application " | TOPICAL_CREAM | CUTANEOUS | Status: DC | PRN

## 2021-11-09 MED ORDER — INSULIN GLARGINE-YFGN 100 UNIT/ML ~~LOC~~ SOLN
20.0000 [IU] | Freq: Every day | SUBCUTANEOUS | Status: DC
  Administered 2021-11-10 – 2021-11-11 (×2): 20 [IU] via SUBCUTANEOUS
  Filled 2021-11-09 (×2): qty 0.2

## 2021-11-09 MED ORDER — SEVELAMER CARBONATE 800 MG PO TABS: 800.0000 mg | ORAL_TABLET | Freq: Every day | ORAL | Status: DC | PRN

## 2021-11-09 MED ORDER — LANTHANUM CARBONATE 500 MG PO CHEW
1000.0000 mg | CHEWABLE_TABLET | Freq: Three times a day (TID) | ORAL | Status: DC
  Administered 2021-11-09 – 2021-11-11 (×6): 1000 mg via ORAL
  Filled 2021-11-09 (×8): qty 2

## 2021-11-09 MED ORDER — GABAPENTIN 100 MG PO CAPS
100.0000 mg | ORAL_CAPSULE | Freq: Every day | ORAL | Status: DC
  Administered 2021-11-10: 22:00:00 100 mg via ORAL
  Filled 2021-11-09: qty 1

## 2021-11-09 MED ORDER — INSULIN ASPART 100 UNIT/ML IJ SOLN
0.0000 [IU] | Freq: Every day | INTRAMUSCULAR | Status: DC
  Administered 2021-11-10: 22:00:00 2 [IU] via SUBCUTANEOUS

## 2021-11-09 MED ORDER — SODIUM BICARBONATE 8.4 % IV SOLN
50.0000 meq | Freq: Once | INTRAVENOUS | Status: AC
  Administered 2021-11-09: 12:00:00 50 meq via INTRAVENOUS
  Filled 2021-11-09: qty 50

## 2021-11-09 MED ORDER — ROSUVASTATIN CALCIUM 20 MG PO TABS
40.0000 mg | ORAL_TABLET | Freq: Every day | ORAL | Status: DC
  Administered 2021-11-09 – 2021-11-11 (×3): 40 mg via ORAL
  Filled 2021-11-09 (×3): qty 2

## 2021-11-09 MED ORDER — SERTRALINE HCL 50 MG PO TABS
50.0000 mg | ORAL_TABLET | Freq: Every day | ORAL | Status: DC
  Administered 2021-11-09 – 2021-11-11 (×3): 50 mg via ORAL
  Filled 2021-11-09 (×4): qty 1

## 2021-11-09 MED ORDER — LIDOCAINE HCL (PF) 1 % IJ SOLN: 5.0000 mL | INTRAMUSCULAR | Status: DC | PRN

## 2021-11-09 MED ORDER — INSULIN ASPART 100 UNIT/ML IJ SOLN
4.0000 [IU] | Freq: Three times a day (TID) | INTRAMUSCULAR | Status: DC
  Administered 2021-11-10 – 2021-11-11 (×3): 4 [IU] via SUBCUTANEOUS

## 2021-11-09 MED ORDER — OXYCODONE-ACETAMINOPHEN 10-325 MG PO TABS: 1.0000 | ORAL_TABLET | Freq: Four times a day (QID) | ORAL | Status: DC | PRN

## 2021-11-09 NOTE — Procedures (Signed)
° °  I was present at this dialysis session, have reviewed the session itself and made  appropriate changes Kelly Splinter MD [place] pager [phone]   11/09/2021, 3:06 PM

## 2021-11-09 NOTE — H&P (Addendum)
History and Physical    Jane [name] KPT:[id number] DOB: [birth year] DOA: 11/08/2021  PCP: [name], MD  Patient coming from: Home  I have personally briefly reviewed patient's old medical records in [place]  Chief Complaint: Increasing shortness of breath and cough  HPI: Jane [name] is a 45 y.o. female with medical history significant for ESRD on HD MWF, CAD s/p BMS to proximal LAD, chronic diastolic heart failure, asthma, type 2 diabetes with neuropathy, hypertension, hyperlipidemia, morbid obesity who presents with concerns of increasing shortness of breath and cough.  Patient reports that she really started to feel unwell on 12/16 following going to [place] for her brothers funeral.  This week she continued to feel worse and thought it may be due to grief since she also lost her mother in April of this year.  Yesterday she began to develop a severe cough, has been nauseous and vomiting.  Has felt hot but no objective fevers.  Her last dialysis session was last Wednesday.  She missed Friday since her fistula infiltrated and she overslept for rescheduled session Saturday.  Could not go today since she was feeling more sick and increasingly Short of breath.  ED Course: He was afebrile, tachycardic with heart rate of 116, tachypneic with RR of 30 placed on 2 L via nasal cannula.  BP of 213/118. Leukocytosis of 13 K, hemoglobin 9.2, lactate of 2.3 Sodium 143, K of 6.2, creatinine of 17.24, BG of 231  Influenza A positive.   She was given 5 unit of insulin with dextrose without much improvement to her potassium.  Nephrology was consulted and recommended giving [facility] and request transfer for her to [facility] for dialysis tomorrow morning.  She was started on Cardene drip for hypertensive urgency.  Also placed on IV Rocephin and azithromycin for community-acquired pneumonia.  CXR shows patchy opacity in the right greater than left favoring pneumonia over  edema.  Review of Systems: Constitutional: No Weight Change, No Fever ENT/Mouth: No sore throat, No Rhinorrhea Eyes: No Vision Changes Cardiovascular: No Chest Pain, +SOB, No PND, + Dyspnea on Exertion, No Orthopnea, No Edema, No Palpitations Respiratory: + Cough, No Sputum, No Wheezing, +Dyspnea  Gastrointestinal: + Nausea, + Vomiting, No Diarrhea, No Constipation, No Pain Genitourinary: no Urinary Incontinence, No Urgency, No Flank Pain Musculoskeletal: No Arthralgias, No Myalgias Skin: No Skin Lesions, No Pruritus, Neuro: no Weakness, No Numbness Psych: No Anxiety/Panic, No Depression, + decrease appetite Heme/Lymph: No Bruising, No Bleeding  Past Medical History:  Diagnosis Date   Abnormal uterine bleeding (AUB)    Asthma    prn inhaler   CAD S/P BMS PCI to prox LAD cardiologist--- dr Beverlyn Roux LAD to Mid LAD lesion, 90% stenosed. Post intervention - Vision BMS 3.0 mm x 18 mm (~3.5 mm) there is a 0% residual stenosis. ;   nuclear stress test-- 09-13-2018 low risk with no ischemia, nuclear ef 56%   Charcot foot due to diabetes mellitus ([place]) 11/2016   left 2018; right 2019   Depression    Diabetic foot ulcer ([place]) 04/22/2019   Diabetic neuropathy (HCC)    feet   Diabetic retinopathy, nonproliferative, severe ([place])    bilateral   ESRD (end stage renal disease) on dialysis [facility])    ?chronic interstitial nephritis  ([place]   H/O non-ST elevation myocardial infarction (NSTEMI) 03/2016   Found in 90% mid LAD lesion treated with bare-metal stent (BMS) PCI - vision BMS 3.0 mm x  18 mm   History of MRSA infection 2007   Hyperlipidemia    Hypertension    IDA (iron deficiency anemia)    Insulin dependent type 2 diabetes mellitus (HCC)    Iron deficiency anemia    takes iron supplement   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 ([place]) 05/23/2018   PAOD (peripheral arterial occlusive disease) ([place])    vascular--- dr Bridgett Larsson   S/P arteriovenous (AV)  fistula creation 07/2017   S/p bare metal coronary artery stent 03/31/2016   BMS x1  to pLAD   Sickle cell trait (HCC)    Ulcer of left foot due to type 2 diabetes mellitus ([place]) 06/03/2019    Past Surgical History:  Procedure Laterality Date   A/V FISTULAGRAM N/A 04/13/2021   Procedure: A/V FISTULAGRAM - Right Arm;  Surgeon: Serafina [place], MD;  Location: [place] [facility];  Service: Cardiovascular;  Laterality: N/A;   ACHILLES TENDON LENGTHENING Left 11/24/2016   Procedure: Left Achilles tendon lengthening (open);  Surgeon: Wylene Simmer, MD;  Location: [place];  Service: Orthopedics;  Laterality: Left;   AV FISTULA PLACEMENT Right 07/31/2017   Procedure: ARTERIOVENOUS BRACHIOCEPHALIC FISTULA CREATION RIGHT ARM;  Surgeon: Conrad Peosta, MD;  Location: [facility];  Service: Vascular;  Laterality: Right;   CALCANEAL OSTEOTOMY Left 11/24/2016   Procedure: Left hindfoot osteotomy and fusion;  Surgeon: Wylene Simmer, MD;  Location: [place];  Service: Orthopedics;  Laterality: Left;   CARDIAC CATHETERIZATION N/A 03/31/2016   Procedure: Left Heart Cath and Coronary Angiography;  Surgeon: Lorretta Harp, MD;  Location: [place] [facility]: 90% early mLAD, normal LV Fxn   CARDIAC CATHETERIZATION N/A 03/31/2016   Procedure: Coronary Stent Intervention;  Surgeon: Lorretta Harp, MD;  Location: [place] [facility];  Service: Cardiovascular: PCI to mLAD BMS Vision 3.0 mm x 18 mm   CESAREAN [place] N/A 09/15/2020   Procedure: DILATATION & CURETTAGE/HYSTEROSCOPY;  Surgeon: Lavonia Drafts, MD;  Location: [place];  Service: Gynecology;  Laterality: N/A;   FISTULA SUPERFICIALIZATION Right 11/15/2017   Procedure: FISTULA SUPERFICIALIZATION RIGHT BRACHIOCEPHALIC  RIGHT;  Surgeon: Conrad [place], MD;  Location: [place];  Service: Vascular;  Laterality: Right;   FOOT SURGERY     multiple for  charcot   PERIPHERAL VASCULAR BALLOON ANGIOPLASTY Right 04/13/2021   Procedure: PERIPHERAL VASCULAR BALLOON ANGIOPLASTY;  Surgeon: Serafina [place], MD;  Location: [place] [facility];  Service: Cardiovascular;  Laterality: Right;  arm fistula   TUBAL LIGATION  1997     reports that she quit smoking about 6 years ago. Her smoking use included cigarettes. She has never used smokeless tobacco. She reports current alcohol use. She reports current drug use. Drug: Marijuana. Social History  Allergies  Allergen Reactions   Adhesive [Tape] Other (See Comments)    Irritation     Family History  Problem Relation Age of Onset   Stroke Mother    Diabetes Mother    Hypertension Mother    Aneurysm Mother    Diabetes Father    Hypertension Father    Diabetes Sister    Hypertension Sister    Diabetes Maternal Grandmother    Diabetes Maternal Grandfather    Cancer Paternal Grandfather        Prostate     Prior to Admission medications   Medication Sig Start Date End Date Taking? Authorizing Provider  Accu-Chek  Softclix Lancets lancets Check blood sugar up to 3 times  day 02/14/21   Mosetta Anis, MD  acetaminophen (TYLENOL) 500 MG tablet Take 1,000 mg by mouth every 6 (six) hours as needed (for pain.).    [provider]  albuterol (PROVENTIL HFA) 108 (90 Base) MCG/ACT inhaler INHALE 2 PUFFS BY MOUTH EVERY 4 HOURS AS NEEDED FOR COUGHING, WHEEZING, OR SHORTNESS OF BREATH Patient taking differently: Inhale 2 puffs into the lungs every 4 (four) hours as needed for wheezing or shortness of breath. INHALE 2 PUFFS BY MOUTH EVERY 4 HOURS AS NEEDED FOR COUGHING, WHEEZING, OR SHORTNESS OF BREATH 01/29/20   [name], MD  amoxicillin (AMOXIL) 500 MG capsule Take 1 capsule (500 mg total) by mouth every 8 (eight) hours until gone 10/05/21     blood glucose meter kit and supplies Dispense based on patient and insurance preference. Use up to four times daily as directed. (FOR ICD-10 E10.9,  E11.9). 01/29/21   Sanjuan Dame, MD  Blood Glucose Monitoring Suppl (ACCU-CHEK GUIDE) w/Device KIT 1 each by Does not apply route 3 (three) times daily. 05/18/17   Alphonzo Grieve, MD  Blood Pressure Monitor MISC USE AS DIRECTED FOUR TIMES DAILY. 01/29/21 01/29/22  Sanjuan Dame, MD  cetirizine (ZYRTEC) 5 MG tablet Take 1 tablet (5 mg total) by mouth daily. 07/30/21 08/29/21  [name], MD  Cholecalciferol (VITAMIN D3) 50 MCG (2000 UT) CAPS Take 4 capsules (8,000 Units total) by mouth daily. 02/24/21     Continuous Blood Gluc Receiver (DEXCOM G6 RECEIVER) DEVI Use to check blood sugar at least 6 times a day 02/25/21   [name], MD  Continuous Blood Gluc Sensor (DEXCOM G6 SENSOR) MISC Use to check blood sugar at least 6 times a day 07/20/21   [name], MD  Continuous Blood Gluc Transmit (DEXCOM G6 TRANSMITTER) MISC Use to check blood sugar at least 6 times a day 07/20/21   [name], MD  diclofenac Sodium (VOLTAREN) 1 % GEL APPLY 4 G TOPICALLY 4 (FOUR) TIMES DAILY. 01/07/21 01/07/22  Madalyn Rob, MD  fluticasone [facility]) 50 MCG/ACT nasal spray Place 2 sprays into both nostrils every night 07/06/21     gabapentin (NEURONTIN) 100 MG capsule Take 1 capsule (100 mg total) by mouth at bedtime. 10/27/21   [name], MD  glucose blood (ACCU-CHEK GUIDE) test strip Check blood sugar up to 3 times  day 02/14/21   Mosetta Anis, MD  ibuprofen (ADVIL) 800 MG tablet Take 1 tablet (800 mg total) by mouth every 6 (six) hours as needed for pain 10/26/21     icosapent Ethyl (VASCEPA) 1 g capsule Take 2 capsules (2 g total) by mouth 2 (two) times daily. 07/27/21   Loel Dubonnet, NP  injection device for insulin (INPEN 100-PINK-LILLY-HUMALOG) DEVI Use to inject Humalog insulin for meals and correction insulin 03/17/21   Madalyn Rob, MD  insulin degludec (TRESIBA) 100 UNIT/ML FlexTouch Pen Inject 24 Units into the skin daily. 06/03/21   [name], MD  insulin lispro (HUMALOG) 100 UNIT/ML  cartridge Use with inpen to inject at meal time and for correction insulin up to 40 units daily 06/04/21   [name], MD  Insulin Pen Needle 32G X 4 MM MISC Use to inject insulin 4 times a day 02/14/21   Mosetta Anis, MD  Insulin Syringe-Needle U-100 31G X 15/64" 0.3 ML MISC Use to inject insulin daily 08/17/18   Aldine Contes, MD  lanthanum (FOSRENOL) 1000 MG chewable tablet  Chew 1 tablet (1,000 mg total) by mouth 3 (three) times daily with meals. 08/11/21     megestrol (MEGACE) 40 MG tablet Take 1 tablet (40 mg total) by mouth daily. Can increase to two tablets twice a day in the event of heavy bleeding 10/12/21   Lavonia Drafts, MD  metoCLOPramide (REGLAN) 10 MG tablet Take 1 tablet (10 mg total) by mouth every 8 (eight) hours as needed for nausea. 08/27/21 09/26/21  Virl Axe, MD  metoprolol tartrate (LOPRESSOR) 25 MG tablet Take 1 tablet (25 mg total) by mouth 2 (two) times daily. Do not take the night before dialysis, and take 2 hours after dialysis. 04/06/21   [name], MD  multivitamin (RENA-VIT) TABS tablet Take 1 tablet by mouth daily. 04/30/18   [provider]  naloxone [facility]) nasal spray 4 mg/0.1 mL Place 1 spray into the nose as needed, if found unresponsive then spray into nose and call 911. 02/24/21     oxyCODONE-acetaminophen (PERCOCET) 10-325 MG tablet Take 1 tablet by mouth 5 (five) times daily as needed. Do not fill before 09/25/21 09/24/21     oxyCODONE-acetaminophen (PERCOCET) 10-325 MG tablet Take 1 tablet by mouth 5 (five) times daily as needed 10/24/21     rosuvastatin (CRESTOR) 40 MG tablet Take 1 tablet (40 mg total) by mouth daily. 07/27/21   Loel Dubonnet, NP  senna-docusate (SENNA-S) 8.6-50 MG tablet Take 1 tablet by mouth 2 (two) times daily. Patient not taking: Reported on 10/06/2021 08/27/21   Virl Axe, MD  sertraline (ZOLOFT) 100 MG tablet Take 0.5 tablets (50 mg total) by mouth daily. 04/01/21   [name], MD   sevelamer carbonate (RENVELA) 800 MG tablet Take 3,200 mg by mouth 3 (three) times daily with meals. Take 800 mg with snacks as needed    [provider]  Sodium Fluoride 1.1 % PSTE Brush teeth with small amount 2 times a day 10/05/21       Physical Exam: Vitals:   11/08/21 2245 11/08/21 2300 11/08/21 2330 11/09/21 0003  BP: (!) 168/142 (!) 177/116 (!) 183/100 (!) 179/90  Pulse: (!) 115 (!) 115 (!) 113 (!) 116  Resp: (!) 33 (!) 30 (!) 28 (!) 26  Temp:      TempSrc:      SpO2: 95% 92% 92% 92%  Weight:      Height:        Constitutional: NAD, calm, comfortable, nontoxic but ill-appearing middle-age female appearing younger than her age sitting upright in bed Vitals:   11/08/21 2245 11/08/21 2300 11/08/21 2330 11/09/21 0003  BP: (!) 168/142 (!) 177/116 (!) 183/100 (!) 179/90  Pulse: (!) 115 (!) 115 (!) 113 (!) 116  Resp: (!) 33 (!) 30 (!) 28 (!) 26  Temp:      TempSrc:      SpO2: 95% 92% 92% 92%  Weight:      Height:       Eyes:  lids and conjunctivae normal ENMT: Mucous membranes are moist.  Neck: normal, supple Respiratory: clear to auscultation bilaterally, no wheezing, no crackles. Normal respiratory effort on 2 L via nasal cannula. No accessory muscle use.  Right upper extremity AV fistula with palpable thrill Cardiovascular: Regular rate and rhythm, no murmurs / rubs / gallops. No extremity edema.  Abdomen: no tenderness, Bowel sounds positive.  Musculoskeletal: no clubbing / cyanosis. No joint deformity upper and lower extremities. Good ROM, no contractures. Normal muscle tone.  Skin: no rashes, lesions, ulcers. No induration  Neurologic: CN 2-12 grossly intact. Strength 5/5 in all 4.  Psychiatric: Normal judgment and insight. Alert and oriented x 3. Normal mood.    Labs on Admission: I have personally reviewed following labs and imaging studies  CBC: Recent Labs  Lab 11/08/21 2030 11/08/21 2043  WBC 13.1*  --   NEUTROABS 10.7*  --   HGB 9.2* 9.5*  HCT  30.1* 28.0*  MCV 90.1  --   PLT 286  --    Basic Metabolic Panel: Recent Labs  Lab 11/08/21 2030 11/08/21 2043 11/08/21 2224  NA 142 143  --   K 6.2* 6.1* 6.0*  CL 103 108  --   CO2 21*  --   --   GLUCOSE 231* 220*  --   BUN 120* 128*  --   CREATININE 17.29* >18.00*  --   CALCIUM 8.5*  --   --    GFR: CrCl cannot be calculated (This lab value cannot be used to calculate CrCl because it is not a number: >18.00). Liver Function Tests: Recent Labs  Lab 11/08/21 2030  AST 22  ALT 23  ALKPHOS 129*  BILITOT 0.9  PROT 8.3*  ALBUMIN 4.0   No results for input(s): LIPASE, AMYLASE in the last 168 hours. No results for input(s): AMMONIA in the last 168 hours. Coagulation Profile: No results for input(s): INR, PROTIME in the last 168 hours. Cardiac Enzymes: No results for input(s): CKTOTAL, CKMB, CKMBINDEX, TROPONINI in the last 168 hours. BNP (last 3 results) No results for input(s): PROBNP in the last 8760 hours. HbA1C: No results for input(s): HGBA1C in the last 72 hours. CBG: No results for input(s): GLUCAP in the last 168 hours. Lipid Profile: No results for input(s): CHOL, HDL, LDLCALC, TRIG, CHOLHDL, LDLDIRECT in the last 72 hours. Thyroid Function Tests: No results for input(s): TSH, T4TOTAL, FREET4, T3FREE, THYROIDAB in the last 72 hours. Anemia Panel: No results for input(s): VITAMINB12, FOLATE, FERRITIN, TIBC, IRON, RETICCTPCT in the last 72 hours. Urine analysis:    Component Value Date/Time   COLORURINE STRAW (A) 07/24/2017 2149   APPEARANCEUR CLEAR 07/24/2017 2149   APPEARANCEUR Clear 04/13/2017 1345   LABSPEC 1.010 07/24/2017 2149   PHURINE 6.0 07/24/2017 2149   GLUCOSEU 50 (A) 07/24/2017 2149   GLUCOSEU > 1000 mg/dL (A) 02/11/2009 1037   HGBUR SMALL (A) 07/24/2017 2149   BILIRUBINUR Negative 01/29/2020 1609   BILIRUBINUR Negative 04/13/2017 1345   KETONESUR NEGATIVE 07/24/2017 2149   PROTEINUR Positive (A) 01/29/2020 1609   PROTEINUR >=300 (A)  07/24/2017 2149   UROBILINOGEN 0.2 01/29/2020 1609   UROBILINOGEN 0.2 08/31/2015 1001   NITRITE Negative 01/29/2020 1609   NITRITE NEGATIVE 07/24/2017 2149   LEUKOCYTESUR Moderate (2+) (A) 01/29/2020 1609   LEUKOCYTESUR Negative 04/13/2017 1345    Radiological Exams on Admission: DG Chest 2 View  Result Date: 11/08/2021 CLINICAL DATA:  Short of breath EXAM: CHEST - 2 VIEW COMPARISON:  05/26/2021 FINDINGS: Patchy airspace opacity in the right thorax and left base. No pleural effusion. Stable cardiomediastinal silhouette. No pneumothorax. IMPRESSION: Patchy airspace opacity in the right greater than left chest, favored to represent pneumonia over asymmetric edema. Electronically Signed   By: Donavan Foil M.D.   On: 11/08/2021 19:36      Assessment/Plan  Acute hypoxic respiratory failure secondary to community-acquired pneumonia Continue IV Rocephin and azithromycin -Test strep and Legionella urine antigen.  Obtain sputum culture.  Sepsis secondary to community acquired pneumonia and flu A  -Patient presented with tachycardia,  tachypnea and leukocytosis with findings of pneumonia on chest x-ray and positive PCR test -start 53m Tamiflu now and after every dialysis session  -continue IV antibiotics as above -has already received 1L of NS in ED. Hold on any further IV fluids since she is overloaded from missed dialysis session   Hypertensive urgency BP  >200/110 Given IV hydralazine 155min ED  Cardene infusion started in ED. Goal of BP 180/90 Likely can wean follow dialysis tomorrow and can resume metoprolol   Hyperkalemia Kmax of 6.2 minimal improvement with insulin and dextrose nephro has recommended adding Lokelma. EKG with stable QRS and no change. Hold on anymore fluid bolus since she was already given 1L in the ED.   ESRD on HD MWF Has missed 2 sessions-last one was last Wednesday  nephrology will take for HD tomorrow morning   DVT prophylaxis:subcu heparin Code Status:  Full Family Communication: Plan discussed with patient at bedside  disposition Plan: Home with at least 2 midnight stays  Consults called: Nephrology Admission status: inpatient   Level of care: Progressive  Status is: Inpatient  Remains inpatient appropriate because: Admit - It is my clinical opinion that admission to INPATIENT is reasonable and necessary because this patient will require at least 2 midnights in the hospital to treat this condition based on the medical complexity of the problems presented.  Given the aforementioned information, the predictability of an adverse outcome is felt to be significant.         ChOrene DesanctisO [facility]   If 7PM-7AM, please contact night-coverage [email or web address]   11/09/2021, 12:37 AM

## 2021-11-09 NOTE — Progress Notes (Addendum)
I have seen and assessed patient and agree with Dr. Idell Pickles assessment and plan.  Patient 45 year old female history of end-stage renal disease on HD Monday Wednesday Friday, CAD status post BMS to proximal LAD, chronic diastolic CHF, type 2 diabetes, asthma, hypertension, hyperlipidemia, morbid obesity presenting with worsening shortness of breath and cough.  Work-up consistent with CAP, influenza A, volume overload.  Patient missed 2 dialysis sessions, first 1 due to infiltrated fistula, second 1 patient states lost power and overslept.  Patient noted on presentation to be hyperkalemic with potassium as high as 7.3 this morning, metabolic acidosis, creatinine on presentation > 18, BUN of 116.  Lactic acid elevated at 2.4.  Chest x-ray consistent with a right-sided pneumonia.  COVID-19 PCR negative.  Influenza A positive.  Patient noted to be in hypertensive urgency on a Cardene drip.  Patient placed on empiric IV antibiotics, Tamiflu.  Patient seen this morning, speaking in choppy sentences, short of breath, nonproductive cough.  Patient seen by nephrology and patient being transferred to [facility] for emergent hemodialysis.  Patient also placed on Lokelma, dextrose, insulin to treat hyperkalemia.  Patient being assessed by nephrology this morning as well.  Patient to be transferred to [facility] under progressive care for emergent hemodialysis this morning.  No charge.

## 2021-11-09 NOTE — Consult Note (Addendum)
Renal Service Consult Note [facility]  Jane [name] [facility] 11/09/2021 Sol Blazing, MD Requesting Physician: Dr. Grandville Silos  Reason for Consult: ESRD pt w/ flu and PNA HPI: The patient is a 45 y.o. year-old w/ hx of CAD sp PCI, DM2 w/ charcot foot, depression, ESRD on HD, HTN, HL, anemia, PAD presented to ED w/ cough, SOB and N/V. Missed HD x 2. [place] was in [place] brothers funeral on 12/16, since then has felt poorly w/ progressive cough and 1 day N/V. She got minimal HD last Friday w/ infiltrated AVF, slept in on Sat and missed the make-up sessions. No HD Monday , in the ED. In ED pt afeb, HR 115, RR 30, BP 213/ 118, K 6.2 . Flu A positive.  She rec'd some IV cardene for HTN and lokelma for HTN, IV abx for CAP. CXR showed patchy diffuse opacity on the right side, PNA favored over edema. Asked to see for renal failure.   Pt seen in ED.  Hx as above.  Feeling okay now.  On nasal O2, cough is the main issue per the pt.  No sig SOB at rest. States had her AVF "worked on" last week, then it infiltrated on Friday. No pain or ulceration.    ROS - denies CP, no joint pain, no HA, no blurry vision, no rash, no diarrhea   Past Medical History  Past Medical History:  Diagnosis Date   Abnormal uterine bleeding (AUB)    Asthma    prn inhaler   CAD S/P BMS PCI to prox LAD cardiologist--- dr Beverlyn Roux LAD to Mid LAD lesion, 90% stenosed. Post intervention - Vision BMS 3.0 mm x 18 mm (~3.5 mm) there is a 0% residual stenosis. ;   nuclear stress test-- 09-13-2018 low risk with no ischemia, nuclear ef 56%   Charcot foot due to diabetes mellitus ([place]) 11/2016   left 2018; right 2019   Depression    Diabetic foot ulcer ([place]) 04/22/2019   Diabetic neuropathy (HCC)    feet   Diabetic retinopathy, nonproliferative, severe ([place])    bilateral   ESRD (end stage renal disease) on dialysis [place])    ?chronic interstitial nephritis  ([facility]   H/O non-ST elevation  myocardial infarction (NSTEMI) 03/2016   Found in 90% mid LAD lesion treated with bare-metal stent (BMS) PCI - vision BMS 3.0 mm x 18 mm   History of MRSA infection 2007   Hyperlipidemia    Hypertension    IDA (iron deficiency anemia)    Insulin dependent type 2 diabetes mellitus (HCC)    Iron deficiency anemia    takes iron supplement   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 ([place]) 05/23/2018   PAOD (peripheral arterial occlusive disease) ([place])    vascular--- dr Bridgett Larsson   S/P arteriovenous (AV) fistula creation 07/2017   S/p bare metal coronary artery stent 03/31/2016   BMS x1  to pLAD   Sickle cell trait (HCC)    Ulcer of left foot due to type 2 diabetes mellitus ([place]) 06/03/2019   Past Surgical History  Past Surgical History:  Procedure Laterality Date   A/V FISTULAGRAM N/A 04/13/2021   Procedure: A/V FISTULAGRAM - Right Arm;  Surgeon: Serafina Mitchell, MD;  Location: [place] [facility];  Service: Cardiovascular;  Laterality: N/A;   ACHILLES TENDON LENGTHENING Left 11/24/2016   Procedure: Left Achilles tendon lengthening (open);  Surgeon: Wylene Simmer, MD;  Location: [facility];  Service: Orthopedics;  Laterality: Left;   AV FISTULA PLACEMENT Right 07/31/2017   Procedure: ARTERIOVENOUS BRACHIOCEPHALIC FISTULA CREATION RIGHT ARM;  Surgeon: Conrad Beaumont, MD;  [facility];  Service: Vascular;  Laterality: Right;   CALCANEAL OSTEOTOMY Left 11/24/2016   Procedure: Left hindfoot osteotomy and fusion;  Surgeon: Wylene Simmer, MD;  Location: [place];  Service: Orthopedics;  Laterality: Left;   CARDIAC CATHETERIZATION N/A 03/31/2016   Procedure: Left Heart Cath and Coronary Angiography;  Surgeon: Lorretta Harp, MD;  Location: [place] [facility]: 90% early mLAD, normal LV Fxn   CARDIAC CATHETERIZATION N/A 03/31/2016   Procedure: Coronary Stent Intervention;  Surgeon: Lorretta Harp, MD;  Location: [place] [facility];  Service: Cardiovascular: PCI  to mLAD BMS Vision 3.0 mm x 18 mm   CESAREAN [place] N/A 09/15/2020   Procedure: DILATATION & CURETTAGE/HYSTEROSCOPY;  Surgeon: Lavonia Drafts, MD;  Location: [place];  Service: Gynecology;  Laterality: N/A;   FISTULA SUPERFICIALIZATION Right 11/15/2017   Procedure: FISTULA SUPERFICIALIZATION RIGHT BRACHIOCEPHALIC  RIGHT;  Surgeon: Conrad Falling Waters, MD;  Location: [place];  Service: Vascular;  Laterality: Right;   FOOT SURGERY     multiple for charcot   PERIPHERAL VASCULAR BALLOON ANGIOPLASTY Right 04/13/2021   Procedure: PERIPHERAL VASCULAR BALLOON ANGIOPLASTY;  Surgeon: Serafina Mitchell, MD;  Location: [place] [facility];  Service: Cardiovascular;  Laterality: Right;  arm fistula   TUBAL LIGATION  1997   Family History  Family History  Problem Relation Age of Onset   Stroke Mother    Diabetes Mother    Hypertension Mother    Aneurysm Mother    Diabetes Father    Hypertension Father    Diabetes Sister    Hypertension Sister    Diabetes Maternal Grandmother    Diabetes Maternal Grandfather    Cancer Paternal Grandfather        Prostate   Social History  reports that she quit smoking about 6 years ago. Her smoking use included cigarettes. She has never used smokeless tobacco. She reports current alcohol use. She reports current drug use. Drug: Marijuana. Allergies  Allergies  Allergen Reactions   Adhesive [Tape] Other (See Comments)    Irritation    Home medications Prior to Admission medications   Medication Sig Start Date End Date Taking? Authorizing Provider  acetaminophen (TYLENOL) 500 MG tablet Take 1,000 mg by mouth every 6 (six) hours as needed (for pain.).   Yes [provider]  amoxicillin (AMOXIL) 500 MG capsule Take 1 capsule (500 mg total) by mouth every 8 (eight) hours until gone 10/05/21  Yes   cetirizine (ZYRTEC) 5 MG tablet Take 1 tablet (5 mg total) by mouth daily.  07/30/21 11/09/22 Yes [name], MD  Cholecalciferol (VITAMIN D3) 50 MCG (2000 UT) CAPS Take 4 capsules (8,000 Units total) by mouth daily. 02/24/21  Yes   diclofenac Sodium (VOLTAREN) 1 % GEL APPLY 4 G TOPICALLY 4 (FOUR) TIMES DAILY. 01/07/21 01/07/22 Yes Madalyn Rob, MD  fluticasone [facility]) 50 MCG/ACT nasal spray Place 2 sprays into both nostrils every night 07/06/21  Yes   gabapentin (NEURONTIN) 100 MG capsule Take 1 capsule (100 mg total) by mouth at bedtime. 10/27/21  Yes [name], MD  ibuprofen (ADVIL) 800 MG tablet Take 1 tablet (800 mg total) by mouth every 6 (six) hours as needed for pain 10/26/21  Yes   icosapent Ethyl (  VASCEPA) 1 g capsule Take 2 capsules (2 g total) by mouth 2 (two) times daily. 07/27/21  Yes Loel Dubonnet, NP  insulin degludec (TRESIBA) 100 UNIT/ML FlexTouch Pen Inject 24 Units into the skin daily. 06/03/21  Yes [name], MD  insulin lispro (HUMALOG) 100 UNIT/ML cartridge Use with inpen to inject at meal time and for correction insulin up to 40 units daily 06/04/21  Yes [name], MD  lanthanum (FOSRENOL) 1000 MG chewable tablet Chew 1 tablet (1,000 mg total) by mouth 3 (three) times daily with meals. 08/11/21  Yes   megestrol (MEGACE) 40 MG tablet Take 1 tablet (40 mg total) by mouth daily. Can increase to two tablets twice a day in the event of heavy bleeding 10/12/21  Yes Lavonia Drafts, MD  metoCLOPramide (REGLAN) 10 MG tablet Take 1 tablet (10 mg total) by mouth every 8 (eight) hours as needed for nausea. 08/27/21 11/09/22 Yes Virl Axe, MD  metoprolol tartrate (LOPRESSOR) 25 MG tablet Take 1 tablet (25 mg total) by mouth 2 (two) times daily. Do not take the night before dialysis, and take 2 hours after dialysis. 04/06/21  Yes [name], MD  oxyCODONE-acetaminophen (PERCOCET) 10-325 MG tablet Take 1 tablet by mouth 5 (five) times daily as needed 10/24/21  Yes   rosuvastatin (CRESTOR) 40 MG tablet Take 1 tablet (40 mg total)  by mouth daily. 07/27/21  Yes Loel Dubonnet, NP  sertraline (ZOLOFT) 100 MG tablet Take 0.5 tablets (50 mg total) by mouth daily. 04/01/21  Yes [name], MD  sevelamer carbonate (RENVELA) 800 MG tablet Take 3,200 mg by mouth 3 (three) times daily with meals. Take 800 mg with snacks as needed   Yes [provider]  Sodium Fluoride 1.1 % PSTE Brush teeth with small amount 2 times a day 10/05/21  Yes   Accu-Chek Softclix Lancets lancets Check blood sugar up to 3 times  day 02/14/21   Mosetta Anis, MD  albuterol (PROVENTIL HFA) 108 (90 Base) MCG/ACT inhaler INHALE 2 PUFFS BY MOUTH EVERY 4 HOURS AS NEEDED FOR COUGHING, WHEEZING, OR SHORTNESS OF BREATH Patient taking differently: Inhale 2 puffs into the lungs every 4 (four) hours as needed for wheezing or shortness of breath. INHALE 2 PUFFS BY MOUTH EVERY 4 HOURS AS NEEDED FOR COUGHING, WHEEZING, OR SHORTNESS OF BREATH 01/29/20   [name], MD  blood glucose meter kit and supplies Dispense based on patient and insurance preference. Use up to four times daily as directed. (FOR ICD-10 E10.9, E11.9). 01/29/21   Sanjuan Dame, MD  Blood Glucose Monitoring Suppl (ACCU-CHEK GUIDE) w/Device KIT 1 each by Does not apply route 3 (three) times daily. 05/18/17   Alphonzo Grieve, MD  Blood Pressure Monitor MISC USE AS DIRECTED FOUR TIMES DAILY. 01/29/21 01/29/22  Sanjuan Dame, MD  Continuous Blood Gluc Receiver (DEXCOM G6 RECEIVER) DEVI Use to check blood sugar at least 6 times a day 02/25/21   [name], MD  Continuous Blood Gluc Sensor (DEXCOM G6 SENSOR) MISC Use to check blood sugar at least 6 times a day 07/20/21   [name], MD  Continuous Blood Gluc Transmit (DEXCOM G6 TRANSMITTER) MISC Use to check blood sugar at least 6 times a day 07/20/21   [name], MD  glucose blood (ACCU-CHEK GUIDE) test strip Check blood sugar up to 3 times  day 02/14/21   Mosetta Anis, MD  injection device for insulin (INPEN 100-PINK-LILLY-HUMALOG)  DEVI Use to inject Humalog insulin for meals and  correction insulin 03/17/21   Madalyn Rob, MD  Insulin Pen Needle 32G X 4 MM MISC Use to inject insulin 4 times a day 02/14/21   Mosetta Anis, MD  Insulin Syringe-Needle U-100 31G X 15/64" 0.3 ML MISC Use to inject insulin daily 08/17/18   Aldine Contes, MD  multivitamin (RENA-VIT) TABS tablet Take 1 tablet by mouth daily. 04/30/18   [provider]  naloxone [facility]) nasal spray 4 mg/0.1 mL Place 1 spray into the nose as needed, if found unresponsive then spray into nose and call 911. Patient not taking: Reported on 11/09/2021 02/24/21        Vitals:   11/09/21 0600 11/09/21 0730 11/09/21 0759 11/09/21 0800  BP: (!) 157/106 (!) 162/82  (!) 142/86  Pulse: (!) 116 (!) 112  (!) 112  Resp: (!) 26 (!) 33  (!) 21  Temp:   98.4 F (36.9 C)   TempSrc:   Oral   SpO2: 94% 96%  93%  Weight:      Height:       Exam Gen alert, no distress, dry cough, no ^wob, calm No rash, cyanosis or gangrene Sclera anicteric, throat clear  No jvd or bruits Chest occ rales/ rhonchi on R, L clear, no wheezing RRR no MRG Abd soft ntnd no mass or ascites +bs GU defer MS no joint effusions or deformity Ext no LE or UE edema, no wounds or ulcers Neuro is alert, Ox 3 , nf RUA AVF+bruit, some bruising     Home meds include - amoxil, neruontin, advil, vascepa, tresiba insulin, lispro, fosrenol 1gm tid ac, megace, reglan 10 tid, lopressor 25 bid, percocet prn, crestor, zoloft, renvela 4 ac tid, rena-vit, narcan prn, prn's/ vits/ supps     OP HD: MWF      4h  101kg   400/1.5   2/2   RUA AVF   Hep 2000  - mircera 75 ug q2      Assessment/ Plan: Acute resp failure/ pulm infiltrates/ +flu A/ CAP - getting IV abx per primary team. On nasal O2, not grossly vol overloaded on exam and CXR more c/w PNA. Nevertheless will try large UF 3 L w/ HD today.  HTN urgency - will dc Cardene as this complicates her getting HD Hyperkalemia - we ordered temporizing  measures after K+ jumped up to 7.3 this am. Will get HD 2nd shift today. Telemetry QRS is very narrow.  ESRD - on HD MWF. Missed Friday (infiltration) and Monday (ED).  Anemia ckd - Hb 9's, follow MBD ckd - cont binder(s), get records      Rob Jonnie Finner  MD 11/09/2021, 8:28 AM  Recent Labs  Lab 11/08/21 2030 11/08/21 2043 11/09/21 0537  WBC 13.1*  --  12.9*  HGB 9.2* 9.5* 8.0*   Recent Labs  Lab 11/08/21 2030 11/08/21 2043 11/08/21 2224 11/09/21 0537  K 6.2* 6.1* 6.0* 7.3*  BUN 120* 128*  --  116*  CREATININE 17.29* >18.00*  --  17.73*  CALCIUM 8.5*  --   --  8.1*

## 2021-11-09 NOTE — Progress Notes (Signed)
PROGRESS NOTE    BARBARANN KELLY  ZDG:[id number] DOB: October 14, 1976 DOA: 11/08/2021 PCP: [name], MD     Brief Narrative:  Jane [name] is a 45 y.o. female with medical history significant for ESRD on HD MWF, CAD s/p BMS to proximal LAD, chronic diastolic heart failure, asthma, type 2 diabetes with neuropathy, hypertension, hyperlipidemia, morbid obesity who presents with concerns of increasing shortness of breath and cough. Patient reported 11/08/21 Monday that she really started to feel unwell on 12/16 following going to [place] for her brothers funeral.  This week she continued to feel worse and thought it may be due to grief since she also lost her mother in April of this year.  Yesterday she began to develop a severe cough, has been nauseous and vomiting.  Has felt hot but no objective fevers.  Her last dialysis session was last Wednesday.  She missed Friday since her fistula infiltrated and she overslept for rescheduled session Saturday.  Could not go today since she was feeling more sick and increasingly Short of breath.  In ED 11/08/21 afebrile, tachycardic with heart rate of 116, tachypneic with RR of 30 placed on 2 L via nasal cannula.  BP of 213/118.Leukocytosis of 13 K, hemoglobin 9.2, lactate of 2.3 Sodium 143, K of 6.2, creatinine of 17.24, BG of 231 Flu A positive. Cardene gtt for HTN urgency. IV Rocephin and Azithro for CAP.  Evening 11/09/21 seen after dialysis. Pt feeling nauseous but not too bad, no SOB/cough. BP better, was off Cardene gtt and 164/85. Dialysis not able to remove much fluid.   Assessment & Plan:   Principal Problem:   Hypertensive urgency Active Problems:   ESRD (end stage renal disease) on dialysis ([place])   Acute respiratory failure with hypoxia (HCC)   Sepsis (HCC)   Influenza A   Community acquired pneumonia   Hyperkalemia  Assessment/Plan   Acute hypoxic respiratory failure secondary to community-acquired  pneumonia Continue IV Rocephin and azithromycin -Test strep and Legionella urine antigen.  Obtain sputum culture.   Sepsis secondary to community acquired pneumonia and flu A  -Patient presented with tachycardia, tachypnea and leukocytosis with findings of pneumonia on chest x-ray and positive PCR test -start 30mg  Tamiflu now and after every dialysis session  -continue IV antibiotics as above -has already received 1L of NS in ED. Hold on any further IV fluids since she is overloaded from missed dialysis session    Hypertensive urgency BP  >200/110 Given IV hydralazine 10mg  in ED  Cardene infusion started in ED. Goal of BP 180/90  BP (!) 172/84 --> 164/85 off gtt    Hyperkalemia Kmax of 6.2 minimal improvement with insulin and dextrose, repeat labs pending after dialysis nephro has recommended adding Lokelma. EKG with stable QRS and no change. Hold on anymore fluid bolus since she was already given 1L in the ED.    ESRD on HD MWF Has missed 2 sessions-last one was last Wednesday  nephrology will take for HD tomorrow morning       DVT prophylaxis:subcu heparin Code Status: Full Family Communication: Plan discussed with patient at bedside  disposition Plan: Home with at least 2 midnight stays  Consults called: Nephrology Admission status: inpatient     Level of care: Progressive   Status is: Inpatient      Consultants:  Nephro  Procedures: none  Antimicrobials: Anti-infectives (From admission, onward)    Start     Dose/Rate Route Frequency Ordered Stop  11/09/21 2200  azithromycin (ZITHROMAX) 500 mg in sodium chloride 0.9 % 250 mL IVPB        500 mg 250 mL/hr over 60 Minutes Intravenous Every 24 hours 11/09/21 0033     11/09/21 2100  cefTRIAXone (ROCEPHIN) 1 g in sodium chloride 0.9 % 100 mL IVPB        1 g 200 mL/hr over 30 Minutes Intravenous Every 24 hours 11/09/21 0033     11/09/21 1800  oseltamivir (TAMIFLU) capsule 30 mg        30 mg Oral Every M-W-F  (Hemodialysis) 11/09/21 1017 11/15/21 1759   11/09/21 1200  oseltamivir (TAMIFLU) capsule 30 mg  Status:  Discontinued        30 mg Oral Every Dialysis 11/09/21 0035 11/09/21 1017   11/09/21 0045  oseltamivir (TAMIFLU) capsule 30 mg        30 mg Oral  Once 11/09/21 0035 11/09/21 0127   11/08/21 2045  cefTRIAXone (ROCEPHIN) 1 g in sodium chloride 0.9 % 100 mL IVPB        1 g 200 mL/hr over 30 Minutes Intravenous  Once 11/08/21 2038 11/08/21 2159   11/08/21 2045  azithromycin (ZITHROMAX) 500 mg in sodium chloride 0.9 % 250 mL IVPB        500 mg 250 mL/hr over 60 Minutes Intravenous  Once 11/08/21 2038 11/08/21 2347        Subjective: Feeling nauseous after dialysis, no cough/SOB, reports fatigue, no pain.   Objective: Vitals:   11/09/21 1700 11/09/21 1730 11/09/21 1800 11/09/21 1811  BP: (!) 167/84 (!) 169/87 (!) 156/85 (!) 172/84  Pulse: 90 96 91 91  Resp:    (!) 24  Temp:    98 F (36.7 C)  TempSrc:    Oral  SpO2:    100%  Weight:    106.3 kg  Height:        Intake/Output Summary (Last 24 hours) at 11/09/2021 1858 Last data filed at 11/09/2021 1811 Gross per 24 hour  Intake 240 ml  Output 657 ml  Net -417 ml   Filed Weights   11/08/21 1913 11/09/21 1349 11/09/21 1811  Weight: 102.1 kg 106.9 kg 106.3 kg    Examination:  General exam: Appears calm and comfortable  Respiratory system: clear except coarse breath sounds bilateral bases but not quite rales. Respiratory effort normal. Cardiovascular system: S1 & S2 heard, RRR. trace pedal edema. Gastrointestinal system: Abdomen is nondistended Central nervous system: Alert and oriented. No focal neurological deficits. Psychiatry: Judgement and insight appear normal. Mood & affect appropriate.     Data Reviewed: I have personally reviewed following labs and imaging studies  CBC: Recent Labs  Lab 11/08/21 2030 11/08/21 2043 11/09/21 0537 11/09/21 0908  WBC 13.1*  --  12.9* 12.1*  NEUTROABS 10.7*  --   --   --    HGB 9.2* 9.5* 8.0* 7.9*  HCT 30.1* 28.0* 26.2* 25.7*  MCV 90.1  --  91.6 90.2  PLT 286  --  265 664   Basic Metabolic Panel: Recent Labs  Lab 11/08/21 2030 11/08/21 2043 11/08/21 2224 11/09/21 0537 11/09/21 0908  NA 142 143  --  141 139  K 6.2* 6.1* 6.0* 7.3* 7.0*  CL 103 108  --  103 101  CO2 21*  --   --  18* 18*  GLUCOSE 231* 220*  --  368* 398*  BUN 120* 128*  --  116* 117*  CREATININE 17.29* >18.00*  --  17.73*  19.07*  CALCIUM 8.5*  --   --  8.1* 8.1*  PHOS  --   --   --   --  7.2*   GFR: Estimated Creatinine Clearance: 4.5 mL/min (A) (by C-G formula based on SCr of 19.07 mg/dL (H)). Liver Function Tests: Recent Labs  Lab 11/08/21 2030 11/09/21 0908  AST 22  --   ALT 23  --   ALKPHOS 129*  --   BILITOT 0.9  --   PROT 8.3*  --   ALBUMIN 4.0 3.5   No results for input(s): LIPASE, AMYLASE in the last 168 hours. No results for input(s): AMMONIA in the last 168 hours. Coagulation Profile: No results for input(s): INR, PROTIME in the last 168 hours. Cardiac Enzymes: No results for input(s): CKTOTAL, CKMB, CKMBINDEX, TROPONINI in the last 168 hours. BNP (last 3 results) No results for input(s): PROBNP in the last 8760 hours. HbA1C: Recent Labs    11/09/21 1309  HGBA1C 9.2*   CBG: Recent Labs  Lab 11/09/21 1156 11/09/21 1225  GLUCAP 419* 368*   Lipid Profile: No results for input(s): CHOL, HDL, LDLCALC, TRIG, CHOLHDL, LDLDIRECT in the last 72 hours. Thyroid Function Tests: No results for input(s): TSH, T4TOTAL, FREET4, T3FREE, THYROIDAB in the last 72 hours. Anemia Panel: No results for input(s): VITAMINB12, FOLATE, FERRITIN, TIBC, IRON, RETICCTPCT in the last 72 hours. Urine analysis:    Component Value Date/Time   COLORURINE STRAW (A) 07/24/2017 2149   APPEARANCEUR CLEAR 07/24/2017 2149   APPEARANCEUR Clear 04/13/2017 1345   LABSPEC 1.010 07/24/2017 2149   PHURINE 6.0 07/24/2017 2149   GLUCOSEU 50 (A) 07/24/2017 2149   GLUCOSEU > 1000 mg/dL  (A) 02/11/2009 1037   HGBUR SMALL (A) 07/24/2017 2149   BILIRUBINUR Negative 01/29/2020 1609   BILIRUBINUR Negative 04/13/2017 1345   KETONESUR NEGATIVE 07/24/2017 2149   PROTEINUR Positive (A) 01/29/2020 1609   PROTEINUR >=300 (A) 07/24/2017 2149   UROBILINOGEN 0.2 01/29/2020 1609   UROBILINOGEN 0.2 08/31/2015 1001   NITRITE Negative 01/29/2020 1609   NITRITE NEGATIVE 07/24/2017 2149   LEUKOCYTESUR Moderate (2+) (A) 01/29/2020 1609   LEUKOCYTESUR Negative 04/13/2017 1345   Sepsis Labs: @LABRCNTIP (procalcitonin:4,lacticidven:4)  ) Recent Results (from the past 240 hour(s))  Resp Panel by RT-PCR (Flu A&B, Covid) Nasopharyngeal Swab     Status: Abnormal   Collection Time: 11/08/21  8:30 PM   Specimen: Nasopharyngeal Swab; Nasopharyngeal(NP) swabs in vial transport medium  Result Value Ref Range Status   SARS Coronavirus 2 by RT PCR NEGATIVE NEGATIVE Final    Comment: (NOTE) SARS-CoV-2 target nucleic acids are NOT DETECTED.  The SARS-CoV-2 RNA is generally detectable in upper respiratory specimens during the acute phase of infection. The lowest concentration of SARS-CoV-2 viral copies this assay can detect is 138 copies/mL. A negative result does not preclude SARS-Cov-2 infection and should not be used as the sole basis for treatment or other patient management decisions. A negative result may occur with  improper specimen collection/handling, submission of specimen other than nasopharyngeal swab, presence of viral mutation(s) within the areas targeted by this assay, and inadequate number of viral copies(<138 copies/mL). A negative result must be combined with clinical observations, patient history, and epidemiological information. The expected result is Negative.  Fact Sheet for Patients:  EntrepreneurPulse.com.au  Fact Sheet for Healthcare Providers:  IncredibleEmployment.be  This test is no t yet approved or cleared by the [place]  FDA and  has been authorized for detection and/or diagnosis of SARS-CoV-2 by FDA under  an Emergency Use Authorization (EUA). This EUA will remain  in effect (meaning this test can be used) for the duration of the COVID-19 declaration under Section 564(b)(1) of the Act, 21 U.S.C.section 360bbb-3(b)(1), unless the authorization is terminated  or revoked sooner.       Influenza A by PCR POSITIVE (A) NEGATIVE Final   Influenza B by PCR NEGATIVE NEGATIVE Final    Comment: (NOTE) The Xpert Xpress SARS-CoV-2/FLU/RSV plus assay is intended as an aid in the diagnosis of influenza from Nasopharyngeal swab specimens and should not be used as a sole basis for treatment. Nasal washings and aspirates are unacceptable for Xpert Xpress SARS-CoV-2/FLU/RSV testing.  Fact Sheet for Patients: EntrepreneurPulse.com.au  Fact Sheet for Healthcare Providers: IncredibleEmployment.be  This test is not yet approved or cleared by the [place] FDA and has been authorized for detection and/or diagnosis of SARS-CoV-2 by FDA under an Emergency Use Authorization (EUA). This EUA will remain in effect (meaning this test can be used) for the duration of the COVID-19 declaration under Section 564(b)(1) of the Act, 21 U.S.C. section 360bbb-3(b)(1), unless the authorization is terminated or revoked.  Performed at [facility], [facility] [address], [place], [place] [address]          Radiology Studies: DG Chest 2 View  Result Date: 11/08/2021 CLINICAL DATA:  Short of breath EXAM: CHEST - 2 VIEW COMPARISON:  05/26/2021 FINDINGS: Patchy airspace opacity in the right thorax and left base. No pleural effusion. Stable cardiomediastinal silhouette. No pneumothorax. IMPRESSION: Patchy airspace opacity in the right greater than left chest, favored to represent pneumonia over asymmetric edema. Electronically Signed   By: Donavan Foil M.D.   On: 11/08/2021 19:36         Scheduled Meds:  Chlorhexidine Gluconate Cloth  6 each Topical Q0600   cholecalciferol  8,000 Units Oral Daily   dextrose  1 ampule Intravenous Once   famotidine  20 mg Oral Daily   fluticasone  2 spray Each Nare Daily   [START ON 11/10/2021] gabapentin  100 mg Oral QHS   guaiFENesin  1,200 mg Oral BID   heparin  5,000 Units Subcutaneous Q8H   icosapent Ethyl  2 g Oral BID   insulin aspart  0-15 Units Subcutaneous TID WC   insulin aspart  0-5 Units Subcutaneous QHS   insulin aspart  4 Units Subcutaneous TID WC   insulin glargine-yfgn  10 Units Subcutaneous Once   [START ON 11/10/2021] insulin glargine-yfgn  20 Units Subcutaneous Daily   ipratropium  0.5 mg Nebulization Q8H   lanthanum  1,000 mg Oral TID with meals   levalbuterol  0.63 mg Nebulization Q8H   loratadine  10 mg Oral Daily   megestrol  40 mg Oral Daily   metoprolol tartrate  5 mg Intravenous Q6H   [START ON 11/10/2021] metoprolol tartrate  25 mg Oral BID   multivitamin  1 tablet Oral QHS   oseltamivir  30 mg Oral Q M,W,F-HD   rosuvastatin  40 mg Oral Daily   sertraline  50 mg Oral Daily   sevelamer carbonate  3,200 mg Oral TID WC   Continuous Infusions:  sodium chloride     sodium chloride     azithromycin     cefTRIAXone (ROCEPHIN)  IV       LOS: 1 day    Time spent: 35 min    Emeterio Reeve, DO [facility] AMION for pager   If 7PM-7AM, please contact night-coverage [email or web address] Password [facility] 11/09/2021,  6:58 PM

## 2021-11-09 NOTE — Progress Notes (Signed)
Pt continued to refuse her uf to be turned on despite her b/p being stable. Pt educated on the importance of trying to get fluid off especially since  missing 2 previous tx. I reduced goal and set B/p monitor at Q5 minutes. Pt agreed to turning uf back on after being educated.Current b/p is 117/60 Pulse of 84.

## 2021-11-09 NOTE — Progress Notes (Signed)
Inpatient Diabetes Program Recommendations  AACE/ADA: New Consensus Statement on Inpatient Glycemic Control (2015)  Target Ranges:  Prepandial:   less than 140 mg/dL      Peak postprandial:   less than 180 mg/dL (1-2 hours)      Critically ill patients:  140 - 180 mg/dL   Lab Results  Component Value Date   GLUCAP 419 (H) 11/09/2021   HGBA1C 6.8 (A) 04/01/2021    Review of Glycemic Control  Latest Reference Range & Units 11/09/21 11:56  Glucose-Capillary 70 - 99 mg/dL 419 (H)  (H): Data is abnormally high  Diabetes history: DM2 Outpatient Diabetes medications: Tresiba 24 units QD, Humalog meal coverage and correction with SSI.   Current orders for Inpatient glycemic control: Semglee 5 units QD  Inpatient Diabetes Program Recommendations:    Semglee 20 units QD (had 5 units already, could administer additional 13 now) Novolog 0-15 units TID and 0-5 units QHS  Attempted to call patient to verify Humalog doses but no answer.  Will reach out to RN and ask if she'll find out.    Will continue to follow while inpatient.  Thank you, Reche Dixon, RN, BSN Diabetes Coordinator Inpatient Diabetes Program [phone] (team pager from 8a-5p)

## 2021-11-09 NOTE — Progress Notes (Signed)
Pt asked if her uf can be turned back on and she stated No, "That her b/p will only keep dropping" Temp on machine reduced to try and bring b/p to an acceptable range for the patient.

## 2021-11-10 ENCOUNTER — Inpatient Hospital Stay (HOSPITAL_COMMUNITY): Payer: 59

## 2021-11-10 ENCOUNTER — Ambulatory Visit: Payer: 59 | Admitting: Gastroenterology

## 2021-11-10 LAB — RENAL FUNCTION PANEL
Albumin: 3 g/dL — ABNORMAL LOW (ref 3.5–5.0)
Anion gap: 16 — ABNORMAL HIGH (ref 5–15)
BUN: 38 mg/dL — ABNORMAL HIGH (ref 6–20)
CO2: 25 mmol/L (ref 22–32)
Calcium: 7.5 mg/dL — ABNORMAL LOW (ref 8.9–10.3)
Chloride: 93 mmol/L — ABNORMAL LOW (ref 98–111)
Creatinine, Ser: 9.22 mg/dL — ABNORMAL HIGH (ref 0.44–1.00)
GFR, Estimated: 5 mL/min — ABNORMAL LOW
Glucose, Bld: 232 mg/dL — ABNORMAL HIGH (ref 70–99)
Phosphorus: 6.9 mg/dL — ABNORMAL HIGH (ref 2.5–4.6)
Potassium: 4.9 mmol/L (ref 3.5–5.1)
Sodium: 134 mmol/L — ABNORMAL LOW (ref 135–145)

## 2021-11-10 LAB — CBC WITH DIFFERENTIAL/PLATELET
Abs Immature Granulocytes: 0.03 10*3/uL (ref 0.00–0.07)
Basophils Absolute: 0 10*3/uL (ref 0.0–0.1)
Basophils Relative: 0 %
Eosinophils Absolute: 0.1 10*3/uL (ref 0.0–0.5)
Eosinophils Relative: 2 %
HCT: 23.9 % — ABNORMAL LOW (ref 36.0–46.0)
Hemoglobin: 7.5 g/dL — ABNORMAL LOW (ref 12.0–15.0)
Immature Granulocytes: 0 %
Lymphocytes Relative: 10 %
Lymphs Abs: 0.8 10*3/uL (ref 0.7–4.0)
MCH: 27.6 pg (ref 26.0–34.0)
MCHC: 31.4 g/dL (ref 30.0–36.0)
MCV: 87.9 fL (ref 80.0–100.0)
Monocytes Absolute: 0.5 10*3/uL (ref 0.1–1.0)
Monocytes Relative: 7 %
Neutro Abs: 6.4 10*3/uL (ref 1.7–7.7)
Neutrophils Relative %: 81 %
Platelets: 202 10*3/uL (ref 150–400)
RBC: 2.72 MIL/uL — ABNORMAL LOW (ref 3.87–5.11)
RDW: 16 % — ABNORMAL HIGH (ref 11.5–15.5)
WBC: 7.8 10*3/uL (ref 4.0–10.5)
nRBC: 0.4 % — ABNORMAL HIGH (ref 0.0–0.2)

## 2021-11-10 LAB — HEPATITIS B SURFACE ANTIBODY, QUANTITATIVE: Hep B S AB Quant (Post): >1000 m[IU]/mL (ref >9.9)

## 2021-11-10 LAB — GLUCOSE, CAPILLARY
Glucose-Capillary: 242 mg/dL — ABNORMAL HIGH (ref 70–99)
Glucose-Capillary: 220 mg/dL — ABNORMAL HIGH (ref 70–99)
Glucose-Capillary: 138 mg/dL — ABNORMAL HIGH (ref 70–99)
Glucose-Capillary: 83 mg/dL (ref 70–99)
Glucose-Capillary: 224 mg/dL — ABNORMAL HIGH (ref 70–99)

## 2021-11-10 MED ORDER — SODIUM CHLORIDE 0.9 % IV SOLN: INTRAVENOUS | Status: DC | PRN

## 2021-11-10 NOTE — Progress Notes (Signed)
[facility] Progress Note  Subjective: pt seen in room. Min UF w/ HD yesterday, pt "felt bad" most of the session when trying to pull fluid. States her BP's weren't matching her symptoms. States she doesn't tolerate SBP under 100.   Vitals:   11/10/21 0608 11/10/21 0750 11/10/21 0843 11/10/21 1120  BP: 139/72 (!) 156/84  (!) 146/80  Pulse: 93 99  91  Resp: 20 18  18   Temp: 99.3 F (37.4 C) 97.9 F (36.6 C) 97.9 F (36.6 C) 97.7 F (36.5 C)  TempSrc: Oral  Oral Oral  SpO2: 100% 98% 100% 98%  Weight: 105.7 kg     Height:        Exam: Gen alert, no distress, dry cough, no ^wob No jvd or bruits Chest R clear, occ rales L base RRR no MRG Abd soft ntnd no mass or ascites +bs Ext no LE or UE edema Neuro is alert, Ox 3 , nf RUA AVF+bruit, some bruising        Home meds include - amoxil, neruontin, advil, vascepa, tresiba insulin, lispro, fosrenol 1gm tid ac, megace, reglan 10 tid, lopressor 25 bid, percocet prn, crestor, zoloft, renvela 4 ac tid, rena-vit, narcan prn, prn's/ vits/ supps        OP HD: MWF      4h  101kg   400/1.5   2/2   RUA AVF   Hep 2000  - mircera 75 ug q2     CXR 12/26 - IMPRESSION: Patchy airspace opacity in the right greater than left chest, favored to represent pneumonia over asymmetric edema.     Assessment/ Plan: Acute resp failure/ pulm infiltrates/ +flu A/ CAP - getting IV abx per primary team. On nasal O2, not grossly vol overloaded on exam and CXR more c/w PNA. Did not tolerate much UF (0.6L) w/ HD yesterday. Not sure wts are correct, will repeat pre HD today. Repeating CXR as well.  HTN urgency - sp brief IV Cardene.  Hyperkalemia - 7.3 K+, down w/ HD now.  ESRD - on HD MWF. Missed Friday (infiltration) and Monday (ED). HD yesterday and HD again today to get back on schedule.  Anemia ckd - Hb 9's, follow MBD ckd - Ca ok, on 2 binders phos 4-8 range     Rob Trevious Rampey 11/10/2021, 12:43 PM   Recent Labs  Lab 11/09/21 0908  11/09/21 1956 11/10/21 0302  K 7.0* 4.1 4.9  BUN 117* 37* 38*  CREATININE 19.07* 7.93* 9.22*  CALCIUM 8.1* 7.7* 7.5*  PHOS 7.2* 4.2 6.9*  HGB 7.9*  --  7.5*   Inpatient medications:  Chlorhexidine Gluconate Cloth  6 each Topical Q0600   cholecalciferol  8,000 Units Oral Daily   dextrose  1 ampule Intravenous Once   famotidine  20 mg Oral Daily   fluticasone  2 spray Each Nare Daily   gabapentin  100 mg Oral QHS   guaiFENesin  1,200 mg Oral BID   heparin  5,000 Units Subcutaneous Q8H   icosapent Ethyl  2 g Oral BID   insulin aspart  0-15 Units Subcutaneous TID WC   insulin aspart  0-5 Units Subcutaneous QHS   insulin aspart  4 Units Subcutaneous TID WC   insulin glargine-yfgn  20 Units Subcutaneous Daily   lanthanum  1,000 mg Oral TID with meals   loratadine  10 mg Oral Daily   megestrol  40 mg Oral Daily   metoprolol tartrate  25 mg Oral BID   multivitamin  1 tablet Oral QHS   oseltamivir  30 mg Oral Q M,W,F-HD   rosuvastatin  40 mg Oral Daily   sertraline  50 mg Oral Daily   sevelamer carbonate  3,200 mg Oral TID WC    azithromycin 500 mg (11/09/21 2259)   cefTRIAXone (ROCEPHIN)  IV 1 g (11/09/21 2200)   albuterol, HYDROcodone bit-homatropine, ibuprofen, ondansetron (ZOFRAN) IV, oxyCODONE-acetaminophen **AND** oxyCODONE, sevelamer carbonate

## 2021-11-10 NOTE — Progress Notes (Signed)
PROGRESS NOTE    Jane [name]  ZOX:[id number] DOB: [birth year] DOA: 11/08/2021 PCP: [name], MD    Brief Narrative:  45 year old ESRD on hemodialysis Monday Wednesday Friday, coronary artery disease, chronic diastolic heart failure, type 2 diabetes with neuropathy, hypertension hyperlipidemia presented with about a week of cough congestion and shortness of breath.  She started symptoms after going to [place] for her brother's funeral since 12/16.  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 In the emergency room afebrile, tachycardic and tachypneic.  On 2 L oxygen.  Chest x-ray with asymmetrical infiltrates.  Influenza a positive.  Potassium 6.2.   Assessment & Plan:   Principal Problem:   Hypertensive urgency Active Problems:   ESRD (end stage renal disease) on dialysis ([place])   Acute respiratory failure with hypoxia ([facility])   Sepsis ([place])   Influenza A   Community acquired pneumonia   Hyperkalemia  Acute hypoxemic stupidly secondary to community-acquired pneumonia Sepsis secondary to community-acquired pneumonia and influenza A: Antibiotics to treat bacterial pneumonia, currently remains on Rocephin and azithromycin. Tamiflu for 5 days, dosed with hemodialysis. Chest physiotherapy, incentive spirometry, deep breathing exercises, sputum induction, mucolytic's and bronchodilators as much she is able to participate.  Mobilize. Cultures were not sent on admission.  We will treat for 7 days for community-acquired bacterial pneumonia. Supplemental oxygen to keep saturations more than 90%.  ESRD on hemodialysis/hyperkalemia due to missed hemodialysis: Receiving subsequent dialysis today.  Hopefully her breathing will be better with more ultrafiltration.  Essential hypertension: Blood pressure is stable now.  Previously on Cardene drip.  Insulin-dependent type 2, well controlled: Currently well controlled on insulin doses.  Continue.   DVT prophylaxis:  heparin injection 5,000 Units Start: 11/09/21 0600   Code Status: Full code Family Communication: None Disposition Plan: Status is: Inpatient  Remains inpatient appropriate because: Inpatient procedures planned         Consultants:  Nephrology  Procedures:  Routine hemodialysis  Antimicrobials:  Rocephin and azithromycin 12/26-- Tamiflu 12/26---   Subjective: Patient was seen and examined.  Dry cough.  Afebrile.  Able to take deep breaths and.  He still feels very weak and achy.  Objective: Vitals:   11/10/21 1320 11/10/21 1327 11/10/21 1330 11/10/21 1400  BP: 121/60 (!) 154/93 138/72 136/81  Pulse: 93   89  Resp: 19 (!) 25 20 20   Temp: 98 F (36.7 C)     TempSrc: Oral     SpO2: 98%     Weight: 106.9 kg     Height:        Intake/Output Summary (Last 24 hours) at 11/10/2021 1410 Last data filed at 11/10/2021 1306 Gross per 24 hour  Intake 840 ml  Output 857 ml  Net -17 ml   Filed Weights   11/09/21 1811 11/10/21 0608 11/10/21 1320  Weight: 106.3 kg 105.7 kg 106.9 kg    Examination:  Looks fairly comfortable. On 2 L oxygen. Bilateral clear.  No added sounds. Abdomen soft and nontender.  Bowel sound present. Legs with no swelling or edema.    Data Reviewed: I have personally reviewed following labs and imaging studies  CBC: Recent Labs  Lab 11/08/21 2030 11/08/21 2043 11/09/21 0537 11/09/21 0908 11/10/21 0302  WBC 13.1*  --  12.9* 12.1* 7.8  NEUTROABS 10.7*  --   --   --  6.4  HGB 9.2* 9.5* 8.0* 7.9* 7.5*  HCT 30.1* 28.0* 26.2* 25.7* 23.9*  MCV 90.1  --  91.6 90.2 87.9  PLT 286  --  265 266 737   Basic Metabolic Panel: Recent Labs  Lab 11/08/21 2030 11/08/21 2043 11/08/21 2224 11/09/21 0537 11/09/21 0908 11/09/21 1956 11/10/21 0302  NA 142 143  --  141 139 134* 134*  K 6.2* 6.1* 6.0* 7.3* 7.0* 4.1 4.9  CL 103 108  --  103 101 94* 93*  CO2 21*  --   --  18* 18* 22 25  GLUCOSE 231* 220*  --  368* 398* 163* 232*  BUN 120* 128*   --  116* 117* 37* 38*  CREATININE 17.29* >18.00*  --  17.73* 19.07* 7.93* 9.22*  CALCIUM 8.5*  --   --  8.1* 8.1* 7.7* 7.5*  PHOS  --   --   --   --  7.2* 4.2 6.9*   GFR: Estimated Creatinine Clearance: 9.3 mL/min (A) (by C-G formula based on SCr of 9.22 mg/dL (H)). Liver Function Tests: Recent Labs  Lab 11/08/21 2030 11/09/21 0908 11/09/21 1956 11/10/21 0302  AST 22  --   --   --   ALT 23  --   --   --   ALKPHOS 129*  --   --   --   BILITOT 0.9  --   --   --   PROT 8.3*  --   --   --   ALBUMIN 4.0 3.5 3.3* 3.0*   No results for input(s): LIPASE, AMYLASE in the last 168 hours. No results for input(s): AMMONIA in the last 168 hours. Coagulation Profile: No results for input(s): INR, PROTIME in the last 168 hours. Cardiac Enzymes: No results for input(s): CKTOTAL, CKMB, CKMBINDEX, TROPONINI in the last 168 hours. BNP (last 3 results) No results for input(s): PROBNP in the last 8760 hours. HbA1C: Recent Labs    11/09/21 1309  HGBA1C 9.2*   CBG: Recent Labs  Lab 11/09/21 1908 11/09/21 2136 11/10/21 0127 11/10/21 0606 11/10/21 1117  GLUCAP 150* 177* 242* 220* 138*   Lipid Profile: No results for input(s): CHOL, HDL, LDLCALC, TRIG, CHOLHDL, LDLDIRECT in the last 72 hours. Thyroid Function Tests: No results for input(s): TSH, T4TOTAL, FREET4, T3FREE, THYROIDAB in the last 72 hours. Anemia Panel: No results for input(s): VITAMINB12, FOLATE, FERRITIN, TIBC, IRON, RETICCTPCT in the last 72 hours. Sepsis Labs: Recent Labs  Lab 11/08/21 2201 11/09/21 0038  LATICACIDVEN 2.3* 2.4*    Recent Results (from the past 240 hour(s))  Resp Panel by RT-PCR (Flu A&B, Covid) Nasopharyngeal Swab     Status: Abnormal   Collection Time: 11/08/21  8:30 PM   Specimen: Nasopharyngeal Swab; Nasopharyngeal(NP) swabs in vial transport medium  Result Value Ref Range Status   SARS Coronavirus 2 by RT PCR NEGATIVE NEGATIVE Final    Comment: (NOTE) SARS-CoV-2 target nucleic acids are NOT  DETECTED.  The SARS-CoV-2 RNA is generally detectable in upper respiratory specimens during the acute phase of infection. The lowest concentration of SARS-CoV-2 viral copies this assay can detect is 138 copies/mL. A negative result does not preclude SARS-Cov-2 infection and should not be used as the sole basis for treatment or other patient management decisions. A negative result may occur with  improper specimen collection/handling, submission of specimen other than nasopharyngeal swab, presence of viral mutation(s) within the areas targeted by this assay, and inadequate number of viral copies(<138 copies/mL). A negative result must be combined with clinical observations, patient history, and epidemiological information. The expected result is Negative.  Fact Sheet for Patients:  EntrepreneurPulse.com.au  Fact Sheet for Healthcare Providers:  IncredibleEmployment.be  This test is no t yet approved or cleared by the [place] and  has been authorized for detection and/or diagnosis of SARS-CoV-2 by FDA under an Emergency Use Authorization (EUA). This EUA will remain  in effect (meaning this test can be used) for the duration of the COVID-19 declaration under Section 564(b)(1) of the Act, 21 U.S.C.section 360bbb-3(b)(1), unless the authorization is terminated  or revoked sooner.       Influenza A by PCR POSITIVE (A) NEGATIVE Final   Influenza B by PCR NEGATIVE NEGATIVE Final    Comment: (NOTE) The Xpert Xpress SARS-CoV-2/FLU/RSV plus assay is intended as an aid in the diagnosis of influenza from Nasopharyngeal swab specimens and should not be used as a sole basis for treatment. Nasal washings and aspirates are unacceptable for Xpert Xpress SARS-CoV-2/FLU/RSV testing.  Fact Sheet for Patients: EntrepreneurPulse.com.au  Fact Sheet for Healthcare Providers: IncredibleEmployment.be  This test is not  yet approved or cleared by the [place] FDA and has been authorized for detection and/or diagnosis of SARS-CoV-2 by FDA under an Emergency Use Authorization (EUA). This EUA will remain in effect (meaning this test can be used) for the duration of the COVID-19 declaration under Section 564(b)(1) of the Act, 21 U.S.C. section 360bbb-3(b)(1), unless the authorization is terminated or revoked.  Performed at [facility], [place] [address] [place], [place] [address]          Radiology Studies: DG Chest 2 View  Result Date: 11/08/2021 CLINICAL DATA:  Short of breath EXAM: CHEST - 2 VIEW COMPARISON:  05/26/2021 FINDINGS: Patchy airspace opacity in the right thorax and left base. No pleural effusion. Stable cardiomediastinal silhouette. No pneumothorax. IMPRESSION: Patchy airspace opacity in the right greater than left chest, favored to represent pneumonia over asymmetric edema. Electronically Signed   By: Donavan Foil M.D.   On: 11/08/2021 19:36        Scheduled Meds:  Chlorhexidine Gluconate Cloth  6 each Topical Q0600   cholecalciferol  8,000 Units Oral Daily   dextrose  1 ampule Intravenous Once   famotidine  20 mg Oral Daily   fluticasone  2 spray Each Nare Daily   gabapentin  100 mg Oral QHS   guaiFENesin  1,200 mg Oral BID   heparin  5,000 Units Subcutaneous Q8H   icosapent Ethyl  2 g Oral BID   insulin aspart  0-15 Units Subcutaneous TID WC   insulin aspart  0-5 Units Subcutaneous QHS   insulin aspart  4 Units Subcutaneous TID WC   insulin glargine-yfgn  20 Units Subcutaneous Daily   lanthanum  1,000 mg Oral TID with meals   loratadine  10 mg Oral Daily   megestrol  40 mg Oral Daily   metoprolol tartrate  25 mg Oral BID   multivitamin  1 tablet Oral QHS   oseltamivir  30 mg Oral Q M,W,F-HD   rosuvastatin  40 mg Oral Daily   sertraline  50 mg Oral Daily   sevelamer carbonate  3,200 mg Oral TID WC   Continuous Infusions:  azithromycin 500 mg  (11/09/21 2259)   cefTRIAXone (ROCEPHIN)  IV 1 g (11/09/21 2200)     LOS: 2 days    Time spent: 30 minutes    Barb Merino, MD [facility] Pager [phone]

## 2021-11-11 ENCOUNTER — Encounter (HOSPITAL_COMMUNITY): Payer: Self-pay

## 2021-11-11 ENCOUNTER — Other Ambulatory Visit (HOSPITAL_COMMUNITY): Payer: Self-pay

## 2021-11-11 LAB — GLUCOSE, CAPILLARY: Glucose-Capillary: 97 mg/dL (ref 70–99)

## 2021-11-11 MED ORDER — ALBUTEROL SULFATE HFA 108 (90 BASE) MCG/ACT IN AERS
2.0000 | INHALATION_SPRAY | RESPIRATORY_TRACT | 2 refills | Status: DC | PRN
  Filled 2021-11-11: qty 20.1, 50d supply, fill #0
  Filled 2022-03-07: qty 20.1, 50d supply, fill #1

## 2021-11-11 MED ORDER — OSELTAMIVIR PHOSPHATE 30 MG PO CAPS
30.0000 mg | ORAL_CAPSULE | ORAL | 0 refills | Status: AC
Start: 2021-11-12 — End: 2021-11-13
  Filled 2021-11-11: qty 1, 1d supply, fill #0

## 2021-11-11 MED ORDER — HYDROCODONE BIT-HOMATROP MBR 5-1.5 MG/5ML PO SOLN
5.0000 mL | Freq: Four times a day (QID) | ORAL | 0 refills | Status: DC | PRN
  Filled 2021-11-11 (×2): qty 120, 6d supply, fill #0

## 2021-11-11 MED ORDER — OSELTAMIVIR PHOSPHATE 30 MG PO CAPS: 30.0000 mg | ORAL_CAPSULE | Freq: Once | ORAL | Status: DC

## 2021-11-11 MED ORDER — OSELTAMIVIR PHOSPHATE 30 MG PO CAPS
30.0000 mg | ORAL_CAPSULE | ORAL | 0 refills | Status: DC
  Filled 2021-11-11: qty 1, 3d supply, fill #0

## 2021-11-11 MED ORDER — AMOXICILLIN-POT CLAVULANATE 875-125 MG PO TABS
1.0000 | ORAL_TABLET | Freq: Two times a day (BID) | ORAL | 0 refills | Status: AC
  Filled 2021-11-11 (×2): qty 10, 5d supply, fill #0

## 2021-11-11 NOTE — Discharge Summary (Signed)
Physician Discharge Summary  Jane [name] [facility] OQH:[id number] DOB: 08/20/1976 DOA: 11/08/2021  PCP: [name], MD  Admit date: 11/08/2021 Discharge date: 11/11/2021  Admitted From: Home Disposition: Home  Recommendations for Outpatient Follow-up:  Dialysis is scheduled as above.  Home Health: N/A Equipment/Devices: N/A  Discharge Condition: Stable CODE STATUS: Full code Diet recommendation: Low-salt and low-carb diet.  Discharge summary: 45 year old ESRD on hemodialysis Monday Wednesday Friday, coronary artery disease, chronic diastolic heart failure, type 2 diabetes with neuropathy, hypertension hyperlipidemia presented with about a week of cough, congestion and shortness of breath.  She started symptoms after going to [place] for her brother's funeral since 12/16.  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 In the emergency room afebrile, tachycardic and tachypneic.  On 2 L oxygen.  Chest x-ray with asymmetrical infiltrates.  Influenza A positive.  Potassium 6.2.   Acute hypoxemic respiratory failure secondary to community-acquired pneumonia Sepsis secondary to community-acquired pneumonia and influenza A: Good clinical improvement.  Currently on room air. Treated with Rocephin and azithromycin for 3 days, will continue Augmentin for 5 more days. Received Tamiflu with dialysis, 1 extra dose tomorrow after dialysis. Cough medications, mucolytic's and albuterol inhalers.   ESRD on hemodialysis/hyperkalemia due to missed hemodialysis: Anemia of chronic disease  receiving continue dialysis with clinical improvement.  Next dialysis tomorrow as outpatient.   Essential hypertension: Blood pressure is stable now.  Home medications resumed.  Insulin-dependent type 2, well controlled: Currently well controlled on insulin doses.  Continue  Medically stabilized.  Going home with oral antibiotics and 1 additional dose of Tamiflu to take after dialysis tomorrow.   Short course of Hycodan was prescribed.  Discharge Diagnoses:  Principal Problem:   Hypertensive urgency Active Problems:   ESRD (end stage renal disease) on dialysis ([place])   Acute respiratory failure with hypoxia (HCC)   Sepsis ([place])   Influenza A   Community acquired pneumonia   Hyperkalemia    Discharge Instructions  Discharge Instructions     Call MD for:  difficulty breathing, headache or visual disturbances   Complete by: As directed    Diet - low sodium heart healthy   Complete by: As directed    Diet Carb Modified   Complete by: As directed    Increase activity slowly   Complete by: As directed       Allergies as of 11/11/2021       Reactions   Adhesive [tape] Other (See Comments)   Irritation        Medication List     STOP taking these medications    amoxicillin 500 MG capsule Commonly known as: AMOXIL   metoCLOPramide 10 MG tablet Commonly known as: REGLAN   Narcan 4 MG/0.1ML Liqd nasal spray kit Generic drug: naloxone       TAKE these medications    Accu-Chek Guide test strip Generic drug: glucose blood Check blood sugar up to 3 times  day   Accu-Chek Guide w/Device Kit 1 each by Does not apply route 3 (three) times daily.   Accu-Chek Softclix Lancets lancets Check blood sugar up to 3 times  day   acetaminophen 500 MG tablet Commonly known as: TYLENOL Take 1,000 mg by mouth every 6 (six) hours as needed (for pain.).   albuterol 108 (90 Base) MCG/ACT inhaler Commonly known as: Proventil HFA INHALE 2 PUFFS BY MOUTH EVERY 4 HOURS AS NEEDED FOR COUGHING, WHEEZING, OR SHORTNESS OF BREATH What changed:  how much to take how to take this when to  take this reasons to take this   amoxicillin-clavulanate 875-125 MG tablet Commonly known as: Augmentin Take 1 tablet by mouth 2 (two) times daily for 5 days.   BD Pen Needle Nano U/F 32G X 4 MM Misc Generic drug: Insulin Pen Needle Use to inject insulin 4 times a day   blood  glucose meter kit and supplies Dispense based on patient and insurance preference. Use up to four times daily as directed. (FOR ICD-10 E10.9, E11.9).   Blood Pressure Monitor Misc USE AS DIRECTED FOUR TIMES DAILY.   cetirizine 5 MG tablet Commonly known as: ZYRTEC Take 1 tablet (5 mg total) by mouth daily.   Dexcom G6 Receiver Devi Use to check blood sugar at least 6 times a day   Dexcom G6 Sensor Misc Use to check blood sugar at least 6 times a day   Dexcom G6 Transmitter Misc Use to check blood sugar at least 6 times a day   diclofenac Sodium 1 % Gel Commonly known as: VOLTAREN APPLY 4 G TOPICALLY 4 (FOUR) TIMES DAILY.   fluticasone 50 MCG/ACT nasal spray Commonly known as: FLONASE Place 2 sprays into both nostrils every night   gabapentin 100 MG capsule Commonly known as: NEURONTIN Take 1 capsule (100 mg total) by mouth at bedtime.   HumaLOG 100 UNIT/ML cartridge Generic drug: insulin lispro Use with inpen to inject at meal time and for correction insulin up to 40 units daily   HYDROcodone bit-homatropine 5-1.5 MG/5ML syrup Commonly known as: HYCODAN Take 5 mLs by mouth every 6 (six) hours as needed for cough.   ibuprofen 800 MG tablet Commonly known as: ADVIL Take 1 tablet (800 mg total) by mouth every 6 (six) hours as needed for pain   InPen 100-Pink-[place]illy-Humalog Devi Generic drug: injection device for insulin Use to inject Humalog insulin for meals and correction insulin   Insulin Syringe-Needle U-100 31G X 15/64" 0.3 ML Misc Use to inject insulin daily   lanthanum 1000 MG chewable tablet Commonly known as: Fosrenol Chew 1 tablet (1,000 mg total) by mouth 3 (three) times daily with meals.   megestrol 40 MG tablet Commonly known as: MEGACE Take 1 tablet (40 mg total) by mouth daily. Can increase to two tablets twice a day in the event of heavy bleeding   metoprolol tartrate 25 MG tablet Commonly known as: LOPRESSOR Take 1 tablet (25 mg total) by mouth  2 (two) times daily. Do not take the night before dialysis, and take 2 hours after dialysis.   multivitamin Tabs tablet Take 1 tablet by mouth daily.   oseltamivir 30 MG capsule Commonly known as: TAMIFLU Take 1 capsule (30 mg total) by mouth every Monday, Wednesday, and Friday with hemodialysis for 1 dose. Take it after dialysis Friday Start taking on: November 12, 2021   oxyCODONE-acetaminophen 10-325 MG tablet Commonly known as: PERCOCET Take 1 tablet by mouth 5 (five) times daily as needed   PreviDent 5000 Booster Plus 1.1 % Pste Generic drug: Sodium Fluoride Brush teeth with small amount 2 times a day   rosuvastatin 40 MG tablet Commonly known as: CRESTOR Take 1 tablet (40 mg total) by mouth daily.   sertraline 100 MG tablet Commonly known as: ZOLOFT Take 0.5 tablets (50 mg total) by mouth daily.   sevelamer carbonate 800 MG tablet Commonly known as: RENVELA Take 3,200 mg by mouth 3 (three) times daily with meals. Take 800 mg with snacks as needed   Tresiba FlexTouch 100 UNIT/ML FlexTouch Pen Generic drug:  insulin degludec Inject 24 Units into the skin daily.   Vascepa 1 g capsule Generic drug: icosapent Ethyl Take 2 capsules (2 g total) by mouth 2 (two) times daily.   vitamin D3 50 MCG (2000 UT) Caps Take 4 capsules (8,000 Units total) by mouth daily.        Allergies  Allergen Reactions   Adhesive [Tape] Other (See Comments)    Irritation     Consultations: Nephrology   Procedures/Studies: DG Chest 2 View  Result Date: 11/08/2021 CLINICAL DATA:  Short of breath EXAM: CHEST - 2 VIEW COMPARISON:  05/26/2021 FINDINGS: Patchy airspace opacity in the right thorax and left base. No pleural effusion. Stable cardiomediastinal silhouette. No pneumothorax. IMPRESSION: Patchy airspace opacity in the right greater than left chest, favored to represent pneumonia over asymmetric edema. Electronically Signed   By: Donavan Foil M.D.   On: 11/08/2021 19:36   US  Transvaginal Non-OB  Result Date: 10/21/2021 CLINICAL DATA:  Initial evaluation for lost IUD strings. EXAM: ULTRASOUND PELVIS TRANSVAGINAL TECHNIQUE: Transvaginal ultrasound examination of the pelvis was performed including evaluation of the uterus, ovaries, adnexal regions, and pelvic cul-de-sac. COMPARISON:  Prior ultrasound from 04/15/2020. FINDINGS: Uterus Measurements: 8.0 x 5.0 x 5.6 cm = volume: 117.4 mL. Heterogeneous echotexture seen within the uterine myometrium. 0.9 x 0.6 x 1.1 cm cystic lesion within the uterine myometrium at the posterior uterine body noted, likely a small cyst, of doubtful significance. No discrete fibroids. Endometrium Thickness: 28 mm. Endometrial stripe is heterogeneous with poorly defined endometrial-myometrial interface, which could reflect a degree of underlying adenomyosis IUD appears to be in place within the endometrial cavity at the level of the lower uterine segment. Right ovary Measurements: 2.6 x 1.5 x 1.5 cm = volume: 3.0 mL. Normal appearance/no adnexal mass. Left ovary Measurements: 3.1 x 1.5 x 2.2 cm = volume: 5.2 mL. Normal appearance/no adnexal mass. Other findings:  No abnormal free fluid IMPRESSION: 1. IUD in place within the endometrial cavity at the level of the lower uterine segment. 2. Thickened endometrial stripe measuring up to 28 mm, considered abnormal. Consider follow-up by [place] in 6-8 weeks, during the week immediately following menses (exam timing is critical). 3. Question underlying uterine adenomyosis. Electronically Signed   By: Jeannine Boga M.D.   On: 10/21/2021 19:02   DG CHEST PORT 1 VIEW  Result Date: 11/10/2021 CLINICAL DATA:  Cough and shortness of breath. EXAM: PORTABLE CHEST 1 VIEW COMPARISON:  11/08/2021 FINDINGS: Stable cardiomediastinal contours. Aortic atherosclerotic calcifications. Right lung interstitial opacities are again noted and appear improved from previous exam. Left lung appears clear. No signs of pleural effusion.  Visualized osseous structures are unremarkable. IMPRESSION: 1. Improving aeration to the right lung. 2. Aortic atherosclerotic calcifications. 3. No new findings. Electronically Signed   By: Kerby Moors M.D.   On: 11/10/2021 18:58   (Echo, Carotid, EGD, Colonoscopy, ERCP)    Subjective: Patient seen and examined.  No overnight events.  Mild dry cough present otherwise afebrile.  Denies any wheezing or shortness of breath.  She was on minimal oxygen and saturating 100%.  Mobilized around with good saturations.   Discharge Exam: Vitals:   11/11/21 0018 11/11/21 0608  BP: 106/62 134/67  Pulse: 75 84  Resp: 18 18  Temp: 98.6 F (37 C) 98.5 F (36.9 C)  SpO2: 100% 100%   Vitals:   11/10/21 2017 11/10/21 2130 11/11/21 0018 11/11/21 0608  BP: 117/62 (!) 114/58 106/62 134/67  Pulse: 92 91 75 84  Resp:  _0 Temp: 98.7 F (37.1 C)  98.6 F (37 C) 98.5 F (36.9 C)  TempSrc: Oral  Oral Oral  SpO2: 100% 98% 100% 100%  Weight:    103.9 kg  Height:        General: Pt is alert, awake, not in acute distress Cardiovascular: RRR, S1/S2 +, no rubs, no gallops Respiratory: CTA bilaterally, no wheezing, no rhonchi Abdominal: Soft, NT, ND, bowel sounds + Extremities: no edema, no cyanosis Right upper extremity AV fistula with thrill.    The results of significant diagnostics from this hospitalization (including imaging, microbiology, ancillary and laboratory) are listed below for reference.     Microbiology: Recent Results (from the past 240 hour(s))  Resp Panel by RT-PCR (Flu A&B, Covid) Nasopharyngeal Swab     Status: Abnormal   Collection Time: 11/08/21  8:30 PM   Specimen: Nasopharyngeal Swab; Nasopharyngeal(NP) swabs in vial transport medium  Result Value Ref Range Status   SARS Coronavirus 2 by RT PCR NEGATIVE NEGATIVE Final    Comment: (NOTE) SARS-CoV-2 target nucleic acids are NOT DETECTED.  The SARS-CoV-2 RNA is generally detectable in upper respiratory specimens  during the acute phase of infection. The lowest concentration of SARS-CoV-2 viral copies this assay can detect is 138 copies/mL. A negative result does not preclude SARS-Cov-2 infection and should not be used as the sole basis for treatment or other patient management decisions. A negative result may occur with  improper specimen collection/handling, submission of specimen other than nasopharyngeal swab, presence of viral mutation(s) within the areas targeted by this assay, and inadequate number of viral copies(<138 copies/mL). A negative result must be combined with clinical observations, patient history, and epidemiological information. The expected result is Negative.  Fact Sheet for Patients:  EntrepreneurPulse.com.au  Fact Sheet for Healthcare Providers:  IncredibleEmployment.be  This test is no t yet approved or cleared by the [place] FDA and  has been authorized for detection and/or diagnosis of SARS-CoV-2 by FDA under an Emergency Use Authorization (EUA). This EUA will remain  in effect (meaning this test can be used) for the duration of the COVID-19 declaration under Section 564(b)(1) of the Act, 21 U.S.C.section 360bbb-3(b)(1), unless the authorization is terminated  or revoked sooner.       Influenza A by PCR POSITIVE (A) NEGATIVE Final   Influenza B by PCR NEGATIVE NEGATIVE Final    Comment: (NOTE) The Xpert Xpress SARS-CoV-2/FLU/RSV plus assay is intended as an aid in the diagnosis of influenza from Nasopharyngeal swab specimens and should not be used as a sole basis for treatment. Nasal washings and aspirates are unacceptable for Xpert Xpress SARS-CoV-2/FLU/RSV testing.  Fact Sheet for Patients: EntrepreneurPulse.com.au  Fact Sheet for Healthcare Providers: IncredibleEmployment.be  This test is not yet approved or cleared by the [place] FDA and has been authorized for detection  and/or diagnosis of SARS-CoV-2 by FDA under an Emergency Use Authorization (EUA). This EUA will remain in effect (meaning this test can be used) for the duration of the COVID-19 declaration under Section 564(b)(1) of the Act, 21 U.S.C. section 360bbb-3(b)(1), unless the authorization is terminated or revoked.  Performed at [facility], [place] [address], [place], [place] [address]      Labs: BNP (last 3 results) Recent Labs    05/26/21 2140  BNP 166.0*   Basic Metabolic Panel: Recent Labs  Lab 11/08/21 2030 11/08/21 2043 11/08/21 2224 11/09/21 0537 11/09/21 0908 11/09/21 1956 11/10/21 0302  NA 142 143  --  141 139 134* 134*  K 6.2* 6.1* 6.0* 7.3* 7.0* 4.1 4.9  CL 103 108  --  103 101 94* 93*  CO2 21*  --   --  18* 18* 22 25  GLUCOSE 231* 220*  --  368* 398* 163* 232*  BUN 120* 128*  --  116* 117* 37* 38*  CREATININE 17.29* >18.00*  --  17.73* 19.07* 7.93* 9.22*  CALCIUM 8.5*  --   --  8.1* 8.1* 7.7* 7.5*  PHOS  --   --   --   --  7.2* 4.2 6.9*   Liver Function Tests: Recent Labs  Lab 11/08/21 2030 11/09/21 0908 11/09/21 1956 11/10/21 0302  AST 22  --   --   --   ALT 23  --   --   --   ALKPHOS 129*  --   --   --   BILITOT 0.9  --   --   --   PROT 8.3*  --   --   --   ALBUMIN 4.0 3.5 3.3* 3.0*   No results for input(s): LIPASE, AMYLASE in the last 168 hours. No results for input(s): AMMONIA in the last 168 hours. CBC: Recent Labs  Lab 11/08/21 2030 11/08/21 2043 11/09/21 0537 11/09/21 0908 11/10/21 0302  WBC 13.1*  --  12.9* 12.1* 7.8  NEUTROABS 10.7*  --   --   --  6.4  HGB 9.2* 9.5* 8.0* 7.9* 7.5*  HCT 30.1* 28.0* 26.2* 25.7* 23.9*  MCV 90.1  --  91.6 90.2 87.9  PLT 286  --  265 266 202   Cardiac Enzymes: No results for input(s): CKTOTAL, CKMB, CKMBINDEX, TROPONINI in the last 168 hours. BNP: Invalid input(s): POCBNP CBG: Recent Labs  Lab 11/10/21 0606 11/10/21 1117 11/10/21 1709 11/10/21 2113 11/11/21 0600  GLUCAP  220* 138* 83 224* 97   D-Dimer No results for input(s): DDIMER in the last 72 hours. Hgb A1c Recent Labs    11/09/21 1309  HGBA1C 9.2*   Lipid Profile No results for input(s): CHOL, HDL, LDLCALC, TRIG, CHOLHDL, LDLDIRECT in the last 72 hours. Thyroid function studies No results for input(s): TSH, T4TOTAL, T3FREE, THYROIDAB in the last 72 hours.  Invalid input(s): FREET3 Anemia work up No results for input(s): VITAMINB12, FOLATE, FERRITIN, TIBC, IRON, RETICCTPCT in the last 72 hours. Urinalysis    Component Value Date/Time   COLORURINE STRAW (A) 07/24/2017 2149   APPEARANCEUR CLEAR 07/24/2017 2149   APPEARANCEUR Clear 04/13/2017 1345   LABSPEC 1.010 07/24/2017 2149   PHURINE 6.0 07/24/2017 2149   GLUCOSEU 50 (A) 07/24/2017 2149   GLUCOSEU > 1000 mg/dL (A) 02/11/2009 1037   HGBUR SMALL (A) 07/24/2017 2149   BILIRUBINUR Negative 01/29/2020 1609   BILIRUBINUR Negative 04/13/2017 1345   KETONESUR NEGATIVE 07/24/2017 2149   PROTEINUR Positive (A) 01/29/2020 1609   PROTEINUR >=300 (A) 07/24/2017 2149   UROBILINOGEN 0.2 01/29/2020 1609   UROBILINOGEN 0.2 08/31/2015 1001   NITRITE Negative 01/29/2020 1609   NITRITE NEGATIVE 07/24/2017 2149   LEUKOCYTESUR Moderate (2+) (A) 01/29/2020 1609   LEUKOCYTESUR Negative 04/13/2017 1345   Sepsis Labs Invalid input(s): PROCALCITONIN,  WBC,  LACTICIDVEN Microbiology Recent Results (from the past 240 hour(s))  Resp Panel by RT-PCR (Flu A&B, Covid) Nasopharyngeal Swab     Status: Abnormal   Collection Time: 11/08/21  8:30 PM   Specimen: Nasopharyngeal Swab; Nasopharyngeal(NP) swabs in vial transport medium  Result Value Ref Range Status   SARS Coronavirus 2 by RT PCR  NEGATIVE NEGATIVE Final    Comment: (NOTE) SARS-CoV-2 target nucleic acids are NOT DETECTED.  The SARS-CoV-2 RNA is generally detectable in upper respiratory specimens during the acute phase of infection. The lowest concentration of SARS-CoV-2 viral copies this assay can  detect is 138 copies/mL. A negative result does not preclude SARS-Cov-2 infection and should not be used as the sole basis for treatment or other patient management decisions. A negative result may occur with  improper specimen collection/handling, submission of specimen other than nasopharyngeal swab, presence of viral mutation(s) within the areas targeted by this assay, and inadequate number of viral copies(<138 copies/mL). A negative result must be combined with clinical observations, patient history, and epidemiological information. The expected result is Negative.  Fact Sheet for Patients:  EntrepreneurPulse.com.au  Fact Sheet for Healthcare Providers:  IncredibleEmployment.be  This test is no t yet approved or cleared by the [place] FDA and  has been authorized for detection and/or diagnosis of SARS-CoV-2 by FDA under an Emergency Use Authorization (EUA). This EUA will remain  in effect (meaning this test can be used) for the duration of the COVID-19 declaration under Section 564(b)(1) of the Act, 21 U.S.C.section 360bbb-3(b)(1), unless the authorization is terminated  or revoked sooner.       Influenza A by PCR POSITIVE (A) NEGATIVE Final   Influenza B by PCR NEGATIVE NEGATIVE Final    Comment: (NOTE) The Xpert Xpress SARS-CoV-2/FLU/RSV plus assay is intended as an aid in the diagnosis of influenza from Nasopharyngeal swab specimens and should not be used as a sole basis for treatment. Nasal washings and aspirates are unacceptable for Xpert Xpress SARS-CoV-2/FLU/RSV testing.  Fact Sheet for Patients: EntrepreneurPulse.com.au  Fact Sheet for Healthcare Providers: IncredibleEmployment.be  This test is not yet approved or cleared by the [place] FDA and has been authorized for detection and/or diagnosis of SARS-CoV-2 by FDA under an Emergency Use Authorization (EUA). This EUA will  remain in effect (meaning this test can be used) for the duration of the COVID-19 declaration under Section 564(b)(1) of the Act, 21 U.S.C. section 360bbb-3(b)(1), unless the authorization is terminated or revoked.  Performed at [facility], [place] [address] [place], [place] [address]      Time coordinating discharge:  35 minutes  SIGNED:   Barb Merino, MD  [facility] 11/11/2021, 9:13 AM

## 2021-11-12 ENCOUNTER — Other Ambulatory Visit (HOSPITAL_COMMUNITY): Payer: Self-pay

## 2021-11-16 ENCOUNTER — Encounter (HOSPITAL_COMMUNITY): Payer: Self-pay

## 2021-11-18 ENCOUNTER — Ambulatory Visit (INDEPENDENT_AMBULATORY_CARE_PROVIDER_SITE_OTHER): Payer: 59 | Admitting: Internal Medicine

## 2021-11-18 ENCOUNTER — Other Ambulatory Visit: Payer: Self-pay

## 2021-11-18 ENCOUNTER — Other Ambulatory Visit (HOSPITAL_COMMUNITY): Payer: Self-pay

## 2021-11-18 DIAGNOSIS — J189 Pneumonia, unspecified organism: Secondary | ICD-10-CM

## 2021-11-18 DIAGNOSIS — E113413 Type 2 diabetes mellitus with severe nonproliferative diabetic retinopathy with macular edema, bilateral: Secondary | ICD-10-CM | POA: Diagnosis not present

## 2021-11-18 DIAGNOSIS — I10 Essential (primary) hypertension: Secondary | ICD-10-CM | POA: Diagnosis not present

## 2021-11-18 DIAGNOSIS — Z794 Long term (current) use of insulin: Secondary | ICD-10-CM

## 2021-11-18 MED ORDER — OZEMPIC (0.25 OR 0.5 MG/DOSE) 2 MG/1.5ML ~~LOC~~ SOPN
0.5000 mg | PEN_INJECTOR | SUBCUTANEOUS | 0 refills | Status: DC
Start: 2021-11-18 — End: 2022-03-03
  Filled 2021-11-18: qty 1.5, 28d supply, fill #0

## 2021-11-18 NOTE — Progress Notes (Addendum)
° °  CC: hospital follow up.  HPI:[name] is a 46 y.o. female who presents for evaluation of hospital follow up for . Please see individual problem based A/P for details.   Depression, PHQ-9: Based on the patients  [place] Visit from 11/18/2021 in [place]  PHQ-9 Total Score 18      score we have 18.  Past Medical History:  Diagnosis Date   Abnormal uterine bleeding (AUB)    Asthma    prn inhaler   CAD S/P BMS PCI to prox LAD cardiologist--- dr Beverlyn Roux LAD to Mid LAD lesion, 90% stenosed. Post intervention - Vision BMS 3.0 mm x 18 mm (~3.5 mm) there is a 0% residual stenosis. ;   nuclear stress test-- 09-13-2018 low risk with no ischemia, nuclear ef 56%   Charcot foot due to diabetes mellitus ([place]) 11/2016   left 2018; right 2019   Depression    Diabetic foot ulcer ([place]) 04/22/2019   Diabetic neuropathy (HCC)    feet   Diabetic retinopathy, nonproliferative, severe ([place])    bilateral   ESRD (end stage renal disease) on dialysis [facility])    ?chronic interstitial nephritis  ([facility]   H/O non-ST elevation myocardial infarction (NSTEMI) 03/2016   Found in 90% mid LAD lesion treated with bare-metal stent (BMS) PCI - vision BMS 3.0 mm x 18 mm   History of MRSA infection 2007   Hyperlipidemia    Hypertension    IDA (iron deficiency anemia)    Insulin dependent type 2 diabetes mellitus (HCC)    Iron deficiency anemia    takes iron supplement   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   HENT: Negative.    Eyes: Negative.    Respiratory:  Positive for cough.   Cardiovascular: Negative.   Genitourinary: Negative.   Musculoskeletal: Negative.   Skin: Negative.   Neurological: Negative.   Psychiatric/Behavioral:  Negative for suicidal ideas.     Physical Exam: Vitals:   11/18/21 1048 11/18/21 1200  BP: (!) 134/56 136/62  Pulse: 82 79  Resp: (!) 24   Temp: 98.3 F (36.8 C)   TempSrc: Oral   SpO2: 100%   Weight: 227 lb 8 oz (103.2 kg)   Height: 5\' 6"  (1.676 m)      General: alert and oriented. HEENT: Conjunctiva nl , antiicteric sclerae, moist mucous membranes, no exudate or erythema Cardiovascular: Normal rate, regular rhythm.  No murmurs, rubs, or gallops Pulmonary : Equal breath sounds, No wheezes, rales, or rhonchi Abdominal: soft, nontender,  bowel sounds present Ext: No edema in lower extremities, no tenderness to palpation of lower extremities.Charcot foot bilaterally Neuro: insensate lower extremities bilaterally, left heel spared, proximal dorsum of right foot spared.   Assessment & Plan:   See Encounters Tab for problem based charting.  Patient discussed with Dr. Daryll Drown

## 2021-11-18 NOTE — Patient Instructions (Addendum)
DearMrs. Laurence Ferrari,  We evaluated you for a hospital follow up after your pneumonia. You appear to be doing much better. We are also pleased that you are back on your regular hemodialysis schedule.  We discussed your diabetes. In addition to avoiding sugary foods, we will start you on Ozempic to help control your diabetes and promote weight loss. This is a once weekly injectable medication, similar to how you have been using your insulins. We will start you on 0.5mg  once weekly. We will plan to see you back in the clinic in 4 weeks for a blood sugar check.

## 2021-11-21 ENCOUNTER — Encounter: Payer: Self-pay | Admitting: Internal Medicine

## 2021-11-21 NOTE — Assessment & Plan Note (Signed)
Compliant with medications. No side effects.   SBP 136 today. Continue metoprolol 25mg  BID and amlodipine TID.

## 2021-11-21 NOTE — Assessment & Plan Note (Signed)
Reports she is feeling better. Less SOB and able to participate in PT. Slight cough but controlling with antitussives. Appetite has returned and denies N/V. No fever or chills.   Resolved.

## 2021-11-21 NOTE — Assessment & Plan Note (Signed)
Pt reports stress eating after losing mother and brother. States her blood sugar typically around 220. Requesting diabetic shoes and asking about Ozempic.  A1c elevated due to dietary indiscretion. Feel she would benefit from starting Ozempic.  Started on Ozempic 0.5mg  once weekly. Will need to follow up in 4 weeks for blood sugar recheck.

## 2021-11-23 ENCOUNTER — Encounter (HOSPITAL_COMMUNITY): Payer: Self-pay

## 2021-11-23 ENCOUNTER — Ambulatory Visit (INDEPENDENT_AMBULATORY_CARE_PROVIDER_SITE_OTHER): Payer: 59 | Admitting: Cardiology

## 2021-11-23 ENCOUNTER — Other Ambulatory Visit (HOSPITAL_COMMUNITY): Payer: Self-pay

## 2021-11-23 ENCOUNTER — Other Ambulatory Visit: Payer: Self-pay

## 2021-11-23 ENCOUNTER — Encounter: Payer: Self-pay | Admitting: Cardiology

## 2021-11-23 VITALS — BP 118/64 | HR 90 | Ht 66.0 in | Wt 224.4 lb

## 2021-11-23 DIAGNOSIS — I2510 Atherosclerotic heart disease of native coronary artery without angina pectoris: Secondary | ICD-10-CM

## 2021-11-23 DIAGNOSIS — N939 Abnormal uterine and vaginal bleeding, unspecified: Secondary | ICD-10-CM

## 2021-11-23 DIAGNOSIS — I779 Disorder of arteries and arterioles, unspecified: Secondary | ICD-10-CM | POA: Diagnosis not present

## 2021-11-23 DIAGNOSIS — I5032 Chronic diastolic (congestive) heart failure: Secondary | ICD-10-CM

## 2021-11-23 DIAGNOSIS — Z01810 Encounter for preprocedural cardiovascular examination: Secondary | ICD-10-CM

## 2021-11-23 DIAGNOSIS — E1169 Type 2 diabetes mellitus with other specified complication: Secondary | ICD-10-CM

## 2021-11-23 DIAGNOSIS — Z9861 Coronary angioplasty status: Secondary | ICD-10-CM

## 2021-11-23 DIAGNOSIS — I160 Hypertensive urgency: Secondary | ICD-10-CM

## 2021-11-23 DIAGNOSIS — I110 Hypertensive heart disease with heart failure: Secondary | ICD-10-CM

## 2021-11-23 DIAGNOSIS — E785 Hyperlipidemia, unspecified: Secondary | ICD-10-CM

## 2021-11-23 MED ORDER — ASPIRIN EC 81 MG PO TBEC: 81.0000 mg | DELAYED_RELEASE_TABLET | Freq: Every day | ORAL | 3 refills | Status: DC

## 2021-11-23 NOTE — Patient Instructions (Addendum)
Medication Instructions:   Restart taking Aspirin 81 mg -- may stop aspirin 5 days prior to surgery in Feb [address]   Also may  hold taking aspirin for at least 3 days if you have  excessive bleeding    *If you need a refill on your cardiac medications before your next appointment, please call your pharmacy*   Lab Work: Not needed    Testing/Procedures:  Not needed  Follow-Up: At [facility], you and your health needs are our priority.  As part of our continuing mission to provide you with exceptional heart care, we have created designated Provider Care Teams.  These Care Teams include your primary Cardiologist (physician) and Advanced Practice Providers (APPs -  Physician Assistants and Nurse Practitioners) who all work together to provide you with the care you need, when you need it.     Your next appointment:   6 month(s)  The format for your next appointment:   In Person  Provider:   Laurann Montana, NP at [facility] office   Then, Glenetta Hew, MD will plan to see you again in 12 month(s).   Other Instructions   Okay to have surgery in Feb - no cardiac testing is needed.   May hold Aspirin  5 days prior to surgery /procedure. Then restart per  [employer]

## 2021-11-23 NOTE — Progress Notes (Signed)
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 She has upcoming surgeries, we can consider reevaluating with artery Dopplers and follow-up.      Relevant Medications   aspirin EC 81 MG tablet   Hypertensive heart disease with chronic diastolic congestive heart failure (HCC) (Chronic)    Echo with normal EF.  Moderate hypertrophy.  Unable to assess diastolic function.  Interestingly, her blood pressure actually is pretty well controlled today on beta-blocker.  Blood pressures mostly depend upon her dialysis status.      Relevant Medications   aspirin EC 81 MG tablet   Hyperlipidemia associated with type 2 diabetes mellitus (HCC) (Chronic)    Lipids were checked back in March of last year.  Very well controlled.  She should be due for labs recheck soon with her PCP.  If not checked, perhaps they can get at dialysis.  Continue current dose of rosuvastatin and Vascepa.  She is on insulin along with Ozempic which has seemed to help her with some weight loss.        Relevant Medications   aspirin EC 81 MG tablet   CAD S/P BMS PCI to prox LAD (Chronic)    Ostial LAD BMS PCI setting STEMI 5 and half years ago.  She has not had any further angina since then.  Echo shows preserved EF.  Somewhere along the line, her Plavix was stopped, and according to her med list was not even discharged on aspirin.  Plan Restart aspirin 81 mg for maintenance. -->  Okay to hold aspirin 5 days preop for surgery or procedures.  (She  has 2 potential surgeries coming up.) Continue low-dose metoprolol.  No longer on calcium channel blocker.  Discontinue since last visit. ->  Defer to nephrology. Continue Vascepa and Crestor along with Ozempic. With no recurrent anginal symptoms, would not recommend any ischemic evaluation as part of preop assessment for D&C as well as potential knee surgery.  I do suspect that if she gets back on the transplant list, she would probably need to have a preop stress test at that time, so I would like to preserve that testing modality.      Relevant Medications   aspirin EC 81 MG tablet   Hypertensive urgency    Recent admission for hypertensive urgency in the setting of a missing couple days of dialysis.  Baseline blood pressure is pretty stable on low-dose beta-blocker.  I suspect as long as she has her dialysis on regular basis, she would not be hypertensive.      Relevant Medications   aspirin EC 81 MG tablet     Other   Preop cardiovascular exam    She has a hysteroscopy and endometrial ablation/D&C scheduled for February.  Seen by PCP in February. => Although she is limited by knee pain, she is able to meet at least 4 if not closer to 6 METS with water aerobics and not having any chest pain or pressure.  Normal echo.  Revised Cardiac Risk Index Would Put Her at  a Class IV Risk based on history of CAD-MI, HFpEF, diabetes with insulin and ESRD.  She is revascularized and has not had any active angina, therefore theoretically is actually only Class III Risk.  The fact that she is able to meet more than 4 METS, without any active angina or CHF symptoms, would recommend proceeding to the OR without further testing as testing would not change management.  She needs to have her gynecologic procedure as well as potential knee surgery both of which are low risk from cardiac standpoint.  Would recommend continuing beta-blocker and maintaining appropriate dialysis dates. Okay to hold aspirin 5 to 7  days preop      Morbid obesity ([place]) (Chronic)    She has lost a little weight.  I am hoping that with her being on Ozempic, she will be able lose more.  This is in conjunction also with her starting to more exercise.  She is actually feeling much better with the water aerobics exercise.      Abnormal uterine bleeding (AUB)    She has pending D&C in February  With no significant cardiac symptoms, would not warrant further testing.  Recent echo was normal. She is on stable dose of beta-blocker and aspirin.  Aspirin can be held 5 to 7 days preop and restarted when safe postop.  Would not recommend any further testing preoperatively.       ===================================  HPI:    Jane [name] is an obese 46 y.o. female with a PMH notable for ESRD-on HD (MWF), CAD (NSTEMI 03/2016-PCI LAD) with chronic HFpEF, DM-2 (with neuropathy), HTN, HLD who presents today for hospital follow-up.  Questionable preop for D&C.  I last saw [place] on January 06, 2021 -> doing okay but had missed a couple days of hemodialysis because of being sick with COVID.  It took her several dialysis days to get back to her dry weight.  Was more symptomatic with being volume up.  More short of breath and little more swelling than usual.  Finally blood pressures were stabilized.  Was recovering from [place] and missed dialysis related CHF.  Was noticing intermittent palpitations specially during dialysis.;  Still having some right foot pain.  Occasional dizziness during dialysis.  Simar Pothier Alcindor was last seen on July 27, 2021 by Laurann Montana, NP for complaints of palpitations.  She had contacted the triage nurse with complaints of palpitations during dialysis.  She routinely has been holding her metoprolol prior to dialysis and start taking after dialysis.  Unfortunately the preceding day of dialysis was interrupted due to nausea and low blood pressures.  She had some emesis.  Indicated she was not taking  rosuvastatin or Vascepa.  Noted about 1 month history of feeling her heart was racing/fluttering. -> Was upset having lost her mother in 02-27-2021. Monitor ordered. Told to restart both rosuvastatin and Vascepa.  Recent Hospitalizations:  Admission from December 26-29, 2022: Presented with a week of cough, congestion and dyspnea.  Symptoms began after going to her brother's funeral up in [place].  She missed her dialysis on the preceding Friday because of her fistula was infiltrated.  Noted to have acute hypoxic respiratory failure with hypertensive urgency (related to volume overload), and treated for CAP/sepsis along with HON K.  Initially treated with Rocephin and azithromycin, converted to Augmentin.  Blood pressure stabilized with dialysis.  No active CHF or angina symptoms.  Her mother passed away in 02-27-2021, and her brother died of a fentanyl  overdose in November 2022.  This follows up 2021 where she had a cousin and (ex)boyfriend die unexpectedly.  Reviewed  CV studies:    The following studies were reviewed today: (if available, images/films reviewed: From Epic Chart or Care Everywhere) 2D Echo 02/18/2021: EF 55 to 60%.  No R WMA.  Mild septal LVH with moderate posterior hypertrophy.  Normal RV size and function.  Normal RVP/PASP.Marland Kitchen  Trivial MR. Zio Patch September 2022: Completely normal study. Zio patch worn for 3 days, 5 hours (September 13-16, 2022) Patient was in sinus rhythm during entire recording duration. Heart rate range 69 to 119 bpm with an average of 89 bpm. Rare PACs and PVCs with rare couplets above. No ventricular bigeminy or trigeminy noted. No arrhythmias (either fast or slow) noted. Symptoms noted during sinus rhythm. Occasionally was during dialysis. No arrhythmias noted.    Interval History:   [place] for hospital follow-up doing okay overall with no major cardiac symptoms. She has had a few palpitations here and there but nothing on the  frequent.  Maybe 2 or 3 times a week but overall better.  She had more palpitations back in November than recently.  She actually has been doing a lot of rehab for her knee including doing water aerobics.  Doing this she denies any real dyspnea, no chest pain or pressure. She has chronic mild edema but no PND or orthopnea.  She told me that she is on a 18-month hold for transplant list now because she may need knee surgery where the knee has been bothered by her Charcot foot.  She is in the process doing physical therapy and rehab to see if this can be prevented.  This visit is basically also a preop visit for that possible surgery, This is in addition to potential D&C in February.  CV Review of Symptoms (Summary) Cardiovascular ROS: positive for - -mild dyspnea at rest on exertion recovering from her pneumonia, but not significant.  Rare off-and-on palpitations but notably improved.Mild edema;She really only notes dizziness sometimes with dialysis if able to dialyze.  When that happened she does feel some palpitations.  negative for - chest pain, irregular heartbeat, orthopnea, paroxysmal nocturnal dyspnea, rapid heart rate, or Syncope/near syncope or TIA/amaurosis fugax, claudication  REVIEWED OF SYSTEMS   Review of Systems  Constitutional:  Positive for weight loss (Mild intentional). Negative for malaise/fatigue (Just does not have a lot of energy.).  HENT:  Negative for congestion.   Respiratory:  Positive for cough and shortness of breath (A little deconditioned and recovering from pneumonia.). Negative for wheezing.        Rare coughing.  Notably improved.  Cardiovascular:        Per HPI  Gastrointestinal:  Negative for blood in stool, melena, nausea and vomiting.  Genitourinary:  Negative for hematuria.       Having irregular uterine bleeding.  Musculoskeletal:  Positive for joint pain (Significant bilateral knee pain but left is worse.).  Neurological:  Positive for dizziness  (Sometimes during dialysis). Negative for focal weakness.  Psychiatric/Behavioral:  Negative for depression and memory loss. The patient is not nervous/anxious and does not have insomnia.    I have reviewed and (if needed) personally updated the patient's problem list, medications, allergies, past medical and surgical history, social and family history.   PAST MEDICAL HISTORY   Past Medical History:  Diagnosis Date   Abnormal uterine bleeding (AUB)    Asthma    prn inhaler   CAD  S/P BMS PCI to prox LAD cardiologist--- dr Beverlyn Roux LAD to Mid LAD lesion, 90% stenosed. Post intervention - Vision BMS 3.0 mm x 18 mm (~3.5 mm) there is a 0% residual stenosis. ;   nuclear stress test-- 09-13-2018 low risk with no ischemia, nuclear ef 56%   Charcot foot due to diabetes mellitus ([place]) 11/2016   left 2018; right 2019   Depression    Diabetic foot ulcer ([place]) 04/22/2019   Diabetic neuropathy (HCC)    feet   Diabetic retinopathy, nonproliferative, severe ([place])    bilateral   ESRD (end stage renal disease) on dialysis [facility])    ?chronic interstitial nephritis  ([facility]   H/O non-ST elevation myocardial infarction (NSTEMI) 03/2016   Found in 90% mid LAD lesion treated with bare-metal stent (BMS) PCI - vision BMS 3.0 mm x 18 mm   History of MRSA infection 2007   Hyperlipidemia    Hypertension    IDA (iron deficiency anemia)    Insulin dependent type 2 diabetes mellitus (HCC)    Iron deficiency anemia    takes iron supplement   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 ([place]) 05/23/2018   PAOD (peripheral arterial occlusive disease) ([place])    vascular--- dr Bridgett Larsson   S/P arteriovenous (AV) fistula creation 07/2017   S/p bare metal coronary artery stent 03/31/2016   BMS x1  to pLAD   Sickle cell trait (HCC)    Ulcer of left foot due to type 2 diabetes mellitus ([place]) 06/03/2019    PAST SURGICAL HISTORY   Past Surgical History:  Procedure Laterality Date   A/V  FISTULAGRAM N/A 04/13/2021   Procedure: A/V FISTULAGRAM - Right Arm;  Surgeon: Serafina Mitchell, MD;  Location: [facility];  Service: Cardiovascular;  Laterality: N/A;   ACHILLES TENDON LENGTHENING Left 11/24/2016   Procedure: Left Achilles tendon lengthening (open);  Surgeon: Wylene Simmer, MD;  Location: [facility];  Service: Orthopedics;  Laterality: Left;   AV FISTULA PLACEMENT Right 07/31/2017   Procedure: ARTERIOVENOUS BRACHIOCEPHALIC FISTULA CREATION RIGHT ARM;  Surgeon: Conrad Oroville, MD;  Location: [facility];  Service: Vascular;  Laterality: Right;   CALCANEAL OSTEOTOMY Left 11/24/2016   Procedure: Left hindfoot osteotomy and fusion;  Surgeon: Wylene Simmer, MD;  Location: [facility];  Service: Orthopedics;  Laterality: Left;   CARDIAC CATHETERIZATION N/A 03/31/2016   Procedure: Left Heart Cath and Coronary Angiography;  Surgeon: Lorretta Harp, MD;  [facility] INVASIVE [facility]: 90% early mLAD, normal LV Fxn   CARDIAC CATHETERIZATION N/A 03/31/2016   Procedure: Coronary Stent Intervention;  Surgeon: Lorretta Harp, MD;  Location: [facility] CV LAB;  Service: Cardiovascular: PCI to mLAD BMS Vision 3.0 mm x 18 mm   CESAREAN [place] N/A 09/15/2020   Procedure: DILATATION & CURETTAGE/HYSTEROSCOPY;  Surgeon: Lavonia Drafts, MD;  Location: [facility];  Service: Gynecology;  Laterality: N/A;   FISTULA SUPERFICIALIZATION Right 11/15/2017   Procedure: FISTULA SUPERFICIALIZATION RIGHT BRACHIOCEPHALIC  RIGHT;  Surgeon: Conrad Marietta, MD;  Location: [facility];  Service: Vascular;  Laterality: Right;   FOOT SURGERY     multiple for [place]   PERIPHERAL VASCULAR BALLOON ANGIOPLASTY Right 04/13/2021   Procedure: PERIPHERAL VASCULAR BALLOON ANGIOPLASTY;  Surgeon: Serafina Mitchell, MD;  Location: [place];  Service: Cardiovascular;  Laterality: Right;  arm fistula    TRANSTHORACIC ECHOCARDIOGRAM  02/2021   EF 55 to 60%.  No R WMA.  Mild septal LVH with moderate posterior hypertrophy.  Normal RV size and function.  Normal RVP/PASP.Marland Kitchen  Trivial MR.   TUBAL LIGATION  1997   Cardiac catheterization with PCI May 2017:90% mLAD; nl LV Fxn --> PCI - Vision BMS 3.74mm x 18 mm        Immunization History  Administered Date(s) Administered   19-influenza Whole 07/30/2019   Hepatitis B, adult 01/25/2018, 02/24/2018, 03/22/2018, 03/24/2018, 07/26/2018   Influenza Whole 01/11/2009, 07/22/2011   Influenza,inj,Quad PF,6+ Mos 08/05/2014, 12/24/2015, 08/04/2016, 08/29/2017, 08/01/2018, 07/30/2019, 09/24/2020   Moderna Sars-Covid-2 Vaccination 02/27/2020, 03/26/2020   PFIZER(Purple Top)SARS-COV-2 Vaccination 08/13/2020   Pneumococcal Conjugate-13 01/30/2018, 08/01/2018   Pneumococcal Polysaccharide-23 01/11/2009, 08/15/2014, 03/31/2018   Tdap 10/28/2013    MEDICATIONS/ALLERGIES   Current Meds  Medication Sig   Accu-Chek Softclix Lancets lancets Check blood sugar up to 3 times  day   acetaminophen (TYLENOL) 500 MG tablet Take 1,000 mg by mouth every 6 (six) hours as needed (for pain.).   albuterol (PROVENTIL HFA) 108 (90 Base) MCG/ACT inhaler Inhale 2 puffs into the lungs every 4 (four) hours as needed for coughing, wheezing, or shortness of breath.   aspirin EC 81 MG tablet Take 1 tablet (81 mg total) by mouth daily. Swallow whole.   blood glucose meter kit and supplies Dispense based on patient and insurance preference. Use up to four times daily as directed. (FOR ICD-10 E10.9, E11.9).   Blood Glucose Monitoring Suppl (ACCU-CHEK GUIDE) w/Device KIT 1 each by Does not apply route 3 (three) times daily.   Blood Pressure Monitor MISC USE AS DIRECTED FOUR TIMES DAILY.   cetirizine (ZYRTEC) 5 MG tablet Take 1 tablet (5 mg total) by mouth daily.   Cholecalciferol (VITAMIN D3) 50 MCG (2000 UT) CAPS Take 4 capsules (8,000 Units total) by mouth daily.   Continuous Blood Gluc  Receiver (DEXCOM G6 RECEIVER) DEVI Use to check blood sugar at least 6 times a day   Continuous Blood Gluc Sensor (DEXCOM G6 SENSOR) MISC Use to check blood sugar at least 6 times a day   Continuous Blood Gluc Transmit (DEXCOM G6 TRANSMITTER) MISC Use to check blood sugar at least 6 times a day   diclofenac Sodium (VOLTAREN) 1 % GEL APPLY 4 G TOPICALLY 4 (FOUR) TIMES DAILY.   fluticasone (FLONASE) 50 MCG/ACT nasal spray Place 2 sprays into both nostrils every night   gabapentin (NEURONTIN) 100 MG capsule Take 1 capsule (100 mg total) by mouth at bedtime.   glucose blood (ACCU-CHEK GUIDE) test strip Check blood sugar up to 3 times  day   HYDROcodone bit-homatropine (HYCODAN) 5-1.5 MG/5ML syrup Take 5 mLs by mouth every 6 (six) hours as needed for cough.   icosapent Ethyl (VASCEPA) 1 g capsule Take 2 capsules (2 g total) by mouth 2 (two) times daily.   injection device for insulin (INPEN 100-PINK-LILLY-HUMALOG) DEVI Use to inject Humalog insulin for meals and correction insulin   insulin degludec (TRESIBA) 100 UNIT/ML FlexTouch Pen Inject 24 Units into the skin daily.   insulin lispro (HUMALOG) 100 UNIT/ML cartridge Use with inpen to inject at meal time and for correction insulin up to 40 units daily   Insulin Pen Needle 32G X 4 MM MISC Use to inject insulin 4 times a day   Insulin Syringe-Needle U-100 31G X 15/64" 0.3 ML MISC Use to inject insulin daily   lanthanum (FOSRENOL) 1000 MG chewable tablet Chew 1 tablet (1,000 mg  total) by mouth 3 (three) times daily with meals.   megestrol (MEGACE) 40 MG tablet Take 1 tablet (40 mg total) by mouth daily. Can increase to two tablets twice a day in the event of heavy bleeding   metoprolol tartrate (LOPRESSOR) 25 MG tablet Take 1 tablet (25 mg total) by mouth 2 (two) times daily. Do not take the night before dialysis, and take 2 hours after dialysis.   multivitamin (RENA-VIT) TABS tablet Take 1 tablet by mouth daily.   oxyCODONE-acetaminophen (PERCOCET)  10-325 MG tablet Take 1 tablet by mouth 5 (five) times daily as needed   rosuvastatin (CRESTOR) 40 MG tablet Take 1 tablet (40 mg total) by mouth daily.   Semaglutide,0.25 or 0.5MG /DOS, (OZEMPIC, 0.25 OR 0.5 MG/DOSE,) 2 MG/1.5ML SOPN Inject 0.5 mg into the skin once a week.   sertraline (ZOLOFT) 100 MG tablet Take 0.5 tablets (50 mg total) by mouth daily.   sevelamer carbonate (RENVELA) 800 MG tablet Take 3,200 mg by mouth 3 (three) times daily with meals. Take 800 mg with snacks as needed   Sodium Fluoride 1.1 % PSTE Brush teeth with small amount 2 times a day    Allergies  Allergen Reactions   Adhesive [Tape] Other (See Comments)    Irritation     SOCIAL HISTORY/FAMILY HISTORY   Reviewed in Epic:  Pertinent findings:  Social History   Tobacco Use   Smoking status: Former    Packs/day: 0.00    Years: 0.50    Pack years: 0.00    Types: Cigarettes    Quit date: 08/29/2015    Years since quitting: 6.2   Smokeless tobacco: Never  Vaping Use   Vaping Use: Never used  Substance Use Topics   Alcohol use: Yes    Alcohol/week: 0.0 standard drinks    Comment: occasional   Drug use: Yes    Types: Marijuana    Comment: 09-08-2020 per pt seldom smokes   Social History   Social History Narrative   Patient does not drink caffeine.   Patient is right handed.       --> 2020 was a difficult year: Her sister died in 24-Apr-2023 (potentially because of COVID-19), her recent ex-boyfriend died in either 25-Mar-2023 or 04/24/2023, this was followed by one of her close friends being murdered by the boyfriend.    --> Good news for 2020 was that she had a new grandkids.    OBJCTIVE -PE, EKG, labs   Wt Readings from Last 3 Encounters:  11/23/21 224 lb 6.4 oz (101.8 kg)  11/18/21 227 lb 8 oz (103.2 kg)  11/11/21 229 lb (103.9 kg)    Physical Exam: BP 118/64    Pulse 90    Ht 5\' 6"  (1.676 m)    Wt 224 lb 6.4 oz (101.8 kg)    LMP  (LMP Unknown) Comment: Ablasion on 12/21/2021   SpO2 97%    BMI 36.22 kg/m   Physical Exam Constitutional:      General: She is not in acute distress.    Appearance: She is obese. She is not toxic-appearing.     Comments: Mild chronically ill appearing.  Relatively well-nourished, well-groomed.  Moderately-borderline morbidly obese.  HENT:     Head: Normocephalic and atraumatic.  Neck:     Vascular: No carotid bruit (Audible fistula bruit).  Cardiovascular:     Rate and Rhythm: Normal rate and regular rhythm. Occasional Extrasystoles are present.    Chest Wall: PMI is not displaced.  Pulses: Normal pulses.     Heart sounds: S1 normal and S2 normal. Heart sounds are distant. Murmur (Cannot exclude soft 1/6 SEM at RUSB.) heard.    No friction rub. Gallop present. S4 sounds present.  Pulmonary:     Effort: Pulmonary effort is normal. No respiratory distress.     Breath sounds: Normal breath sounds. No wheezing.     Comments: Diffuse coarse rhonchorous Chest:     Chest wall: Tenderness present.  Musculoskeletal:        General: Swelling (Trace ankle swelling.) present.     Cervical back: Normal range of motion and neck supple.     Right lower leg: Edema (Borderline right foot edema.) present.  Skin:    General: Skin is warm and dry.  Neurological:     General: No focal deficit present.     Mental Status: She is alert and oriented to person, place, and time.     Gait: Gait abnormal.  Psychiatric:        Behavior: Behavior normal.        Thought Content: Thought content normal.        Judgment: Judgment normal.     Comments: Mood is somewhat down, but she does not seem depressed.    Adult ECG Report Not checked  Recent Labs: Reviewed.  May be due to have lipids checked soon Lab Results  Component Value Date   CHOL 130 01/27/2021   HDL 44 01/27/2021   LDLCALC 66 01/27/2021   TRIG 107 01/27/2021   CHOLHDL 3.0 01/27/2021   Lab Results  Component Value Date   CREATININE 9.22 (H) 11/10/2021   BUN 38 (H) 11/10/2021   NA 134 (L) 11/10/2021   K  4.9 11/10/2021   CL 93 (L) 11/10/2021   CO2 25 11/10/2021   CBC Latest Ref Rng & Units 11/10/2021 11/09/2021 11/09/2021  WBC 4.0 - 10.5 K/uL 7.8 12.1(H) 12.9(H)  Hemoglobin 12.0 - 15.0 g/dL 7.5(L) 7.9(L) 8.0(L)  Hematocrit 36.0 - 46.0 % 23.9(L) 25.7(L) 26.2(L)  Platelets 150 - 400 K/uL 202 266 265    Lab Results  Component Value Date   HGBA1C 9.2 (H) 11/09/2021   Lab Results  Component Value Date   TSH 0.91 06/15/2016    ==================================================  COVID-19 Education: The signs and symptoms of COVID-19 were discussed with the patient and how to seek care for testing (follow up with PCP or arrange E-visit).    I spent a total of 29 minutes with the patient spent in direct patient consultation.  Additional time spent with chart review  / charting (studies, outside notes, etc): 26 min Total Time: 55 min  Current medicines are reviewed at length with the patient today.  (+/- concerns) -- NEEDS TO RESTART ASA  This visit occurred during the SARS-CoV-2 public health emergency.  Safety protocols were in place, including screening questions prior to the visit, additional usage of staff PPE, and extensive cleaning of exam room while observing appropriate contact time as indicated for disinfecting solutions.  Notice: This dictation was prepared with Dragon dictation along with smart phrase technology. Any transcriptional errors that result from this process are unintentional and may not be corrected upon review.  Studies Ordered:   No orders of the defined types were placed in this encounter.   Patient Instructions / Medication Changes & Studies & Tests Ordered   Patient Instructions  Medication Instructions:   Restart taking Aspirin 81 mg -- may stop aspirin 5 days prior to surgery  in Feb 20223   Also may  hold taking aspirin for at least 3 days if you have  excessive bleeding    *If you need a refill on your cardiac medications before your next  appointment, please call your pharmacy*   Lab Work: Not needed    Testing/Procedures:  Not needed  Follow-Up: At [facility], you and your health needs are our priority.  As part of our continuing mission to provide you with exceptional heart care, we have created designated Provider Care Teams.  These Care Teams include your primary Cardiologist (physician) and Advanced Practice Providers (APPs -  Physician Assistants and Nurse Practitioners) who all work together to provide you with the care you need, when you need it.     Your next appointment:   6 month(s)  The format for your next appointment:   In Person  Provider:   Laurann Montana, NP at [facility] office   Then, Glenetta Hew, MD will plan to see you again in 12 month(s).   Other Instructions   Okay to have surgery in Feb - no cardiac testing is needed.   May hold Aspirin  5 days prior to surgery /procedure. Then restart per  Gynecologist Also okay to proceed with knee surgery.  Again may hold aspirin 5 days prior to surgery   Glenetta Hew, M.D., M.S. Interventional Cardiologist   Pager # [phone] Phone # [phone] [address]. [place], [place] [address]   Thank you for choosing Heartcare at [facility]!!

## 2021-11-23 NOTE — Progress Notes (Signed)
Internal Medicine Clinic Attending  Case discussed with Dr. Elliot Gurney  at the time of the visit.  We reviewed the residents history and exam and pertinent patient test results.  I agree with the assessment, diagnosis, and plan of care documented in the residents note.   Unfortunately, we were not able to address the elevated PHQ-9 score today of 18.  Will address mood at next visit.

## 2021-11-24 ENCOUNTER — Other Ambulatory Visit (HOSPITAL_COMMUNITY): Payer: Self-pay

## 2021-11-24 MED ORDER — OXYCODONE-ACETAMINOPHEN 10-325 MG PO TABS
1.0000 | ORAL_TABLET | Freq: Every day | ORAL | 0 refills | Status: DC
Start: 2021-11-23 — End: 2022-05-18
  Filled 2021-11-24 – 2021-11-25 (×2): qty 150, 30d supply, fill #0
  Filled 2022-05-13: qty 150, 30d supply, fill #1
  Filled ????-??-??: fill #1

## 2021-11-25 ENCOUNTER — Other Ambulatory Visit (HOSPITAL_COMMUNITY): Payer: Self-pay

## 2021-11-30 ENCOUNTER — Encounter: Payer: Self-pay | Admitting: Cardiology

## 2021-11-30 NOTE — Assessment & Plan Note (Signed)
Recent admission for hypertensive urgency in the setting of a missing couple days of dialysis.  Baseline blood pressure is pretty stable on low-dose beta-blocker.  I suspect as long as she has her dialysis on regular basis, she would not be hypertensive.

## 2021-11-30 NOTE — Assessment & Plan Note (Signed)
She has a hysteroscopy and endometrial ablation/D&C scheduled for February.  Seen by PCP in February. => Although she is limited by knee pain, she is able to meet at least 4 if not closer to 6 METS with water aerobics and not having any chest pain or pressure.  Normal echo.  Revised Cardiac Risk Index Would Put Her at a Class IV Risk based on history of CAD-MI, HFpEF, diabetes with insulin and ESRD.  She is revascularized and has not had any active angina, therefore theoretically is actually only Class III Risk.  The fact that she is able to meet more than 4 METS, without any active angina or CHF symptoms, would recommend proceeding to the OR without further testing as testing would not change management.  She needs to have her gynecologic procedure as well as potential knee surgery both of which are low risk from cardiac standpoint.  Would recommend continuing beta-blocker and maintaining appropriate dialysis dates. Okay to hold aspirin 5 to 7 days preop

## 2021-11-30 NOTE — Assessment & Plan Note (Signed)
She has lost a little weight.  I am hoping that with her being on Ozempic, she will be able lose more.  This is in conjunction also with her starting to more exercise.  She is actually feeling much better with the water aerobics exercise.

## 2021-11-30 NOTE — Assessment & Plan Note (Signed)
Has not been evaluated in several years. She has upcoming surgeries, we can consider reevaluating with artery Dopplers and follow-up.

## 2021-11-30 NOTE — Assessment & Plan Note (Signed)
She has pending D&C in February  With no significant cardiac symptoms, would not warrant further testing.  Recent echo was normal. She is on stable dose of beta-blocker and aspirin.  Aspirin can be held 5 to 7 days preop and restarted when safe postop.  Would not recommend any further testing preoperatively.

## 2021-11-30 NOTE — Assessment & Plan Note (Signed)
Echo with normal EF.  Moderate hypertrophy.  Unable to assess diastolic function.  Interestingly, her blood pressure actually is pretty well controlled today on beta-blocker.  Blood pressures mostly depend upon her dialysis status.

## 2021-11-30 NOTE — Assessment & Plan Note (Signed)
Lipids were checked back in March of last year.  Very well controlled.  She should be due for labs recheck soon with her PCP.  If not checked, perhaps they can get at dialysis.  Continue current dose of rosuvastatin and Vascepa.  She is on insulin along with Ozempic which has seemed to help her with some weight loss.

## 2021-11-30 NOTE — Assessment & Plan Note (Signed)
Ostial LAD BMS PCI setting STEMI 5 and half years ago.  She has not had any further angina since then.  Echo shows preserved EF.  Somewhere along the line, her Plavix was stopped, and according to her med list was not even discharged on aspirin.  Plan  Restart aspirin 81 mg for maintenance. -->  Okay to hold aspirin 5 days preop for surgery or procedures.  (She has 2 potential surgeries coming up.)  Continue low-dose metoprolol.  No longer on calcium channel blocker.  Discontinue since last visit. ->  Defer to nephrology.  Continue Vascepa and Crestor along with Ozempic.  With no recurrent anginal symptoms, would not recommend any ischemic evaluation as part of preop assessment for D&C as well as potential knee surgery.  I do suspect that if she gets back on the transplant list, she would probably need to have a preop stress test at that time, so I would like to preserve that testing modality.

## 2021-12-01 ENCOUNTER — Other Ambulatory Visit (HOSPITAL_COMMUNITY): Payer: Self-pay

## 2021-12-06 ENCOUNTER — Ambulatory Visit: Payer: 59 | Admitting: Gastroenterology

## 2021-12-06 ENCOUNTER — Other Ambulatory Visit (HOSPITAL_COMMUNITY): Payer: Self-pay

## 2021-12-06 MED ORDER — IBUPROFEN 800 MG PO TABS
800.0000 mg | ORAL_TABLET | Freq: Four times a day (QID) | ORAL | 0 refills | Status: DC | PRN
  Filled 2021-12-06: qty 11, 3d supply, fill #0

## 2021-12-07 ENCOUNTER — Other Ambulatory Visit (HOSPITAL_COMMUNITY): Payer: Self-pay

## 2021-12-07 MED ORDER — AMOXICILLIN 500 MG PO CAPS
500.0000 mg | ORAL_CAPSULE | Freq: Three times a day (TID) | ORAL | 0 refills | Status: DC
  Filled 2021-12-07: qty 21, 7d supply, fill #0

## 2021-12-16 ENCOUNTER — Encounter (HOSPITAL_BASED_OUTPATIENT_CLINIC_OR_DEPARTMENT_OTHER): Payer: Self-pay | Admitting: Obstetrics & Gynecology

## 2021-12-16 ENCOUNTER — Other Ambulatory Visit: Payer: Self-pay

## 2021-12-16 NOTE — Progress Notes (Signed)
Spoke w/ via phone for pre-op interview---Ottilie Lab needs dos---- [employer] results------ COVID test -----patient states asymptomatic no test needed Arrive at -------1220 NPO after MN NO Solid Food.  Clear liquids from MN until---1120 Med rec completed Medications to take morning of surgery ----- Bring albuterol inhaler, Lopressor,Percocet PRN and Zoloft. Diabetic medication ----- No diabetic meds AM of surgery. 1/2 dose night before Patient instructed no nail polish to be worn day of surgery Patient instructed to bring photo id and insurance card day of surgery Patient aware to have Driver (ride ) / caregiver Bradd Canary    for 24 hours after surgery  Patient Special Instructions ----- Pre-Op special Istructions ----- Patient verbalized understanding of instructions that were given at this phone interview. Patient denies shortness of breath, chest pain, fever, cough at this phone interview. Anesthesia Review:  PCP: Dr. Charleen Kirks Cardiologist :[name] (Clearance note in [place] dated 11/23/2021. Chest x-ray : EKG :10/2021 Echo :EF 55-60% Stress test: Cardiac Cath :  Activity level:  Sleep Study/ CPAP : Fasting Blood Sugar : 120's     / Checks Blood Sugar -- times a day:  Continuous Dexcom monitor. Blood Thinner/ Instructions [name] Dose: ASA / Instructions/ Last Dose :  Ok per Dr. [name] to hold ASA 5-7 days before procedure.

## 2021-12-20 ENCOUNTER — Telehealth: Payer: Self-pay

## 2021-12-20 NOTE — Telephone Encounter (Signed)
Called and spoke to Ms. Beirne and let her know that her surgery time has changed. Time changed to 1:00pm, patient to arrive at 11:00am, patient expressed understanding.

## 2021-12-20 NOTE — Progress Notes (Signed)
Pt returned call about time change. Pt instructed to arrive to surgery center at 10:30 am. NPO for solids after midnight, clear liquids until 9:30 am. Pt verbalized understanding of changes. All questions answered.

## 2021-12-21 ENCOUNTER — Ambulatory Visit (HOSPITAL_BASED_OUTPATIENT_CLINIC_OR_DEPARTMENT_OTHER)
Admission: RE | Admit: 2021-12-21 | Discharge: 2021-12-21 | Disposition: A | Discharge status: Home / Self Care | Payer: 59 | Attending: Obstetrics & Gynecology | Admitting: Obstetrics & Gynecology

## 2021-12-21 ENCOUNTER — Ambulatory Visit (HOSPITAL_BASED_OUTPATIENT_CLINIC_OR_DEPARTMENT_OTHER): Payer: 59 | Admitting: Anesthesiology

## 2021-12-21 ENCOUNTER — Other Ambulatory Visit: Payer: Self-pay

## 2021-12-21 ENCOUNTER — Encounter (HOSPITAL_BASED_OUTPATIENT_CLINIC_OR_DEPARTMENT_OTHER)
Admission: RE | Disposition: A | Discharge status: Home / Self Care | Payer: Self-pay | Source: Home / Self Care | Attending: Obstetrics & Gynecology

## 2021-12-21 ENCOUNTER — Encounter (HOSPITAL_BASED_OUTPATIENT_CLINIC_OR_DEPARTMENT_OTHER): Payer: Self-pay | Admitting: Obstetrics & Gynecology

## 2021-12-21 DIAGNOSIS — I252 Old myocardial infarction: Secondary | ICD-10-CM | POA: Insufficient documentation

## 2021-12-21 DIAGNOSIS — Z794 Long term (current) use of insulin: Secondary | ICD-10-CM | POA: Diagnosis not present

## 2021-12-21 DIAGNOSIS — F32A Depression, unspecified: Secondary | ICD-10-CM | POA: Diagnosis not present

## 2021-12-21 DIAGNOSIS — I132 Hypertensive heart and chronic kidney disease with heart failure and with stage 5 chronic kidney disease, or end stage renal disease: Secondary | ICD-10-CM | POA: Diagnosis not present

## 2021-12-21 DIAGNOSIS — Z87891 Personal history of nicotine dependence: Secondary | ICD-10-CM | POA: Insufficient documentation

## 2021-12-21 DIAGNOSIS — N939 Abnormal uterine and vaginal bleeding, unspecified: Secondary | ICD-10-CM | POA: Diagnosis not present

## 2021-12-21 DIAGNOSIS — Z955 Presence of coronary angioplasty implant and graft: Secondary | ICD-10-CM | POA: Diagnosis not present

## 2021-12-21 DIAGNOSIS — N711 Chronic inflammatory disease of uterus: Secondary | ICD-10-CM | POA: Insufficient documentation

## 2021-12-21 DIAGNOSIS — J45909 Unspecified asthma, uncomplicated: Secondary | ICD-10-CM | POA: Diagnosis not present

## 2021-12-21 DIAGNOSIS — G709 Myoneural disorder, unspecified: Secondary | ICD-10-CM | POA: Diagnosis not present

## 2021-12-21 DIAGNOSIS — I2510 Atherosclerotic heart disease of native coronary artery without angina pectoris: Secondary | ICD-10-CM | POA: Insufficient documentation

## 2021-12-21 DIAGNOSIS — E1122 Type 2 diabetes mellitus with diabetic chronic kidney disease: Secondary | ICD-10-CM | POA: Insufficient documentation

## 2021-12-21 DIAGNOSIS — N186 End stage renal disease: Secondary | ICD-10-CM | POA: Diagnosis not present

## 2021-12-21 DIAGNOSIS — D573 Sickle-cell trait: Secondary | ICD-10-CM | POA: Diagnosis not present

## 2021-12-21 DIAGNOSIS — E669 Obesity, unspecified: Secondary | ICD-10-CM | POA: Insufficient documentation

## 2021-12-21 DIAGNOSIS — Z6837 Body mass index (BMI) 37.0-37.9, adult: Secondary | ICD-10-CM | POA: Diagnosis not present

## 2021-12-21 HISTORY — PX: DILITATION & CURRETTAGE/HYSTROSCOPY WITH NOVASURE ABLATION: SHX5568

## 2021-12-21 HISTORY — DX: Unilateral primary osteoarthritis, unspecified knee: M17.10

## 2021-12-21 LAB — POCT I-STAT, CHEM 8
BUN: 49 mg/dL — ABNORMAL HIGH (ref 6–20)
Calcium, Ion: 0.88 mmol/L — CL (ref 1.15–1.40)
Chloride: 100 mmol/L (ref 98–111)
Creatinine, Ser: 12.2 mg/dL — ABNORMAL HIGH (ref 0.44–1.00)
Glucose, Bld: 244 mg/dL — ABNORMAL HIGH (ref 70–99)
HCT: 44 % (ref 36.0–46.0)
Hemoglobin: 15 g/dL (ref 12.0–15.0)
Potassium: 5.1 mmol/L (ref 3.5–5.1)
Sodium: 135 mmol/L (ref 135–145)
TCO2: 26 mmol/L (ref 22–32)

## 2021-12-21 LAB — GLUCOSE, CAPILLARY: Glucose-Capillary: 201 mg/dL — ABNORMAL HIGH (ref 70–99)

## 2021-12-21 LAB — CBC
HCT: 44.4 % (ref 36.0–46.0)
Hemoglobin: 12.9 g/dL (ref 12.0–15.0)
MCH: 27.2 pg (ref 26.0–34.0)
MCHC: 29.1 g/dL — ABNORMAL LOW (ref 30.0–36.0)
MCV: 93.7 fL (ref 80.0–100.0)
Platelets: 264 10*3/uL (ref 150–400)
RBC: 4.74 MIL/uL (ref 3.87–5.11)
RDW: 17.3 % — ABNORMAL HIGH (ref 11.5–15.5)
WBC: 6.1 10*3/uL (ref 4.0–10.5)
nRBC: 0 % (ref 0.0–0.2)

## 2021-12-21 SURGERY — DILATATION & CURETTAGE/HYSTEROSCOPY WITH NOVASURE ABLATION
Anesthesia: General | Site: Uterus

## 2021-12-21 MED ORDER — SODIUM CHLORIDE 0.9 % IR SOLN
Status: DC | PRN
  Administered 2021-12-21: 1000 mL

## 2021-12-21 MED ORDER — MIDAZOLAM HCL 2 MG/2ML IJ SOLN
INTRAMUSCULAR | Status: AC
  Filled 2021-12-21: qty 2

## 2021-12-21 MED ORDER — FENTANYL CITRATE (PF) 100 MCG/2ML IJ SOLN
INTRAMUSCULAR | Status: AC
  Filled 2021-12-21: qty 2

## 2021-12-21 MED ORDER — LACTATED RINGERS IV SOLN: INTRAVENOUS | Status: DC

## 2021-12-21 MED ORDER — SODIUM CHLORIDE 0.9 % IV SOLN: INTRAVENOUS | Status: DC

## 2021-12-21 MED ORDER — FENTANYL CITRATE (PF) 100 MCG/2ML IJ SOLN
INTRAMUSCULAR | Status: DC | PRN
  Administered 2021-12-21 (×4): 25 ug via INTRAVENOUS

## 2021-12-21 MED ORDER — OXYCODONE HCL 5 MG/5ML PO SOLN: 5.0000 mg | Freq: Once | ORAL | Status: DC | PRN

## 2021-12-21 MED ORDER — PROPOFOL 10 MG/ML IV BOLUS
INTRAVENOUS | Status: AC
  Filled 2021-12-21: qty 20

## 2021-12-21 MED ORDER — FENTANYL CITRATE (PF) 100 MCG/2ML IJ SOLN: 25.0000 ug | INTRAMUSCULAR | Status: DC | PRN

## 2021-12-21 MED ORDER — MEPERIDINE HCL 25 MG/ML IJ SOLN: 6.2500 mg | INTRAMUSCULAR | Status: DC | PRN

## 2021-12-21 MED ORDER — ONDANSETRON HCL 4 MG/2ML IJ SOLN: 4.0000 mg | Freq: Once | INTRAMUSCULAR | Status: DC | PRN

## 2021-12-21 MED ORDER — SOD CITRATE-CITRIC ACID 500-334 MG/5ML PO SOLN: 30.0000 mL | ORAL | Status: DC

## 2021-12-21 MED ORDER — ACETAMINOPHEN 325 MG PO TABS: 325.0000 mg | ORAL_TABLET | ORAL | Status: DC | PRN

## 2021-12-21 MED ORDER — LIDOCAINE HCL (CARDIAC) PF 100 MG/5ML IV SOSY
PREFILLED_SYRINGE | INTRAVENOUS | Status: DC | PRN
  Administered 2021-12-21: 60 mg via INTRAVENOUS

## 2021-12-21 MED ORDER — ACETAMINOPHEN 160 MG/5ML PO SOLN: 325.0000 mg | ORAL | Status: DC | PRN

## 2021-12-21 MED ORDER — ONDANSETRON HCL 4 MG/2ML IJ SOLN
INTRAMUSCULAR | Status: DC | PRN
  Administered 2021-12-21: 4 mg via INTRAVENOUS

## 2021-12-21 MED ORDER — MIDAZOLAM HCL 2 MG/2ML IJ SOLN
INTRAMUSCULAR | Status: DC | PRN
Start: 2021-12-21 — End: 2021-12-21
  Administered 2021-12-21: 2 mg via INTRAVENOUS

## 2021-12-21 MED ORDER — PROPOFOL 10 MG/ML IV BOLUS
INTRAVENOUS | Status: DC | PRN
  Administered 2021-12-21: 200 mg via INTRAVENOUS

## 2021-12-21 MED ORDER — OXYCODONE HCL 5 MG PO TABS: 5.0000 mg | ORAL_TABLET | Freq: Once | ORAL | Status: DC | PRN

## 2021-12-21 MED ORDER — BUPIVACAINE HCL (PF) 0.5 % IJ SOLN
INTRAMUSCULAR | Status: DC | PRN
  Administered 2021-12-21: 20 mL

## 2021-12-21 SURGICAL SUPPLY — 15 items
ABLATOR SURESOUND NOVASURE (ABLATOR) ×2 IMPLANT
CATH ROBINSON RED A/P 16FR (CATHETERS) ×2 IMPLANT
DRSG TELFA 3X8 NADH (GAUZE/BANDAGES/DRESSINGS) ×2 IMPLANT
GAUZE 4X4 16PLY ~~LOC~~+RFID DBL (SPONGE) ×4 IMPLANT
GLOVE SURG ENC MOIS LTX SZ7 (GLOVE) ×2 IMPLANT
GLOVE SURG UNDER POLY LF SZ7 (GLOVE) ×2 IMPLANT
GOWN STRL REUS W/TWL LRG LVL3 (GOWN DISPOSABLE) ×2 IMPLANT
IV NS IRRIG 3000ML ARTHROMATIC (IV SOLUTION) ×2 IMPLANT
KIT PROCEDURE FLUENT (KITS) ×2 IMPLANT
KIT TURNOVER CYSTO (KITS) ×2 IMPLANT
PACK VAGINAL MINOR WOMEN LF (CUSTOM PROCEDURE TRAY) ×2 IMPLANT
PAD DRESSING TELFA 3X8 NADH (GAUZE/BANDAGES/DRESSINGS) ×1 IMPLANT
PAD OB MATERNITY 4.3X12.25 (PERSONAL CARE ITEMS) ×2 IMPLANT
SEAL ROD LENS SCOPE MYOSURE (ABLATOR) ×2 IMPLANT
TOWEL OR 17X26 10 PK STRL BLUE (TOWEL DISPOSABLE) ×2 IMPLANT

## 2021-12-21 NOTE — H&P (Signed)
Preoperative History and Physical  Jane [name] is a 46 y.o. [id number] here for surgical management of abnormal uterine bleeding.   Proposed surgery: hysteroscopy with dilation and curettage and endometrial ablation using Novasure   Past Medical History:  Diagnosis Date   Abnormal uterine bleeding (AUB)    Asthma    prn inhaler   CAD S/P BMS PCI to prox LAD cardiologist--- dr Judithann Sauger LAD to Mid LAD lesion, 90% stenosed. Post intervention - Vision BMS 3.0 mm x 18 mm (~3.5 mm) there is a 0% residual stenosis. 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   left 2018; right 2019   Depression    Diabetic foot ulcer (HCC) 04/22/2019   Diabetic neuropathy (HCC)    feet   Diabetic retinopathy, nonproliferative, severe (HCC)    bilateral   ESRD (end stage renal disease) on dialysis [facility])    ?chronic interstitial nephritis  ([facility]   H/O non-ST elevation myocardial infarction (NSTEMI) 03/2016   Found in 90% mid LAD lesion treated with bare-metal stent (BMS) PCI - vision BMS 3.0 mm x 18 mm   History of MRSA infection 2007   Hyperlipidemia    Hypertension    IDA (iron deficiency anemia)    Insulin dependent type 2 diabetes mellitus (HCC)    Iron deficiency anemia    takes iron supplement   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   PAOD (peripheral arterial occlusive disease) (HCC)    vascular--- dr Imogene Burn   S/P arteriovenous (AV) fistula creation 07/2017   S/p bare metal coronary artery stent 03/31/2016   BMS x1  to pLAD   Sickle cell trait (HCC)    Ulcer of left foot due to type 2 diabetes mellitus (HCC) 06/03/2019   Past Surgical History:  Procedure Laterality Date   A/V FISTULAGRAM N/A 04/13/2021   Procedure: A/V FISTULAGRAM - Right Arm;  Surgeon: Nada Libman, MD;  Location: [facility];  Service: Cardiovascular;  Laterality:  N/A;   ACHILLES TENDON LENGTHENING Left 11/24/2016   Procedure: Left Achilles tendon lengthening (open);  Surgeon: Toni Arthurs, MD;  Location: [facility];  Service: Orthopedics;  Laterality: Left;   AV FISTULA PLACEMENT Right 07/31/2017   Procedure: ARTERIOVENOUS BRACHIOCEPHALIC FISTULA CREATION RIGHT ARM;  Surgeon: Fransisco Hertz, MD;  Location: [facility] OR;  Service: Vascular;  Laterality: Right;   CALCANEAL OSTEOTOMY Left 11/24/2016   Procedure: Left hindfoot osteotomy and fusion;  Surgeon: Toni Arthurs, MD;  Location: [facility];  Service: Orthopedics;  Laterality: Left;   CARDIAC CATHETERIZATION N/A 03/31/2016   Procedure: Left Heart Cath and Coronary Angiography;  Surgeon: Runell Gess, MD;  Location: [facility] INVASIVE CV LAB: 90% early mLAD, normal LV Fxn   CARDIAC CATHETERIZATION N/A 03/31/2016   Procedure: Coronary Stent Intervention;  Surgeon: Runell Gess, MD;  Location: [facility];  Service: Cardiovascular: PCI to mLAD BMS Vision 3.0 mm x 18 mm   CESAREAN SECTION  1997   DILITATION & CURRETTAGE/HYSTROSCOPY WITH NOVASURE ABLATION N/A 09/15/2020   Procedure: DILATATION & CURETTAGE/HYSTEROSCOPY;  Surgeon: Willodean Rosenthal, MD;  Location: [facility];  Service: Gynecology;  Laterality: N/A;   FISTULA SUPERFICIALIZATION Right 11/15/2017   Procedure: FISTULA SUPERFICIALIZATION RIGHT BRACHIOCEPHALIC  RIGHT;  Surgeon: Fransisco Hertz, MD;  Location: [facility] OR;  Service: Vascular;  Laterality: Right;   FOOT SURGERY     multiple for charcot   PERIPHERAL VASCULAR BALLOON ANGIOPLASTY Right 04/13/2021   Procedure: PERIPHERAL VASCULAR BALLOON ANGIOPLASTY;  Surgeon: Serafina Mitchell, MD;  Location: [facility];  Service: Cardiovascular;  Laterality: Right;  arm fistula   TRANSTHORACIC ECHOCARDIOGRAM  02/2021   EF 55 to 60%.  No R WMA.  Mild septal LVH with moderate posterior hypertrophy.  Normal RV size and function.  Normal RVP/PASP.Marland Kitchen  Trivial  MR.   TUBAL LIGATION  1997   OB History     Gravida  2   Para  2   Term  1   Preterm  1   AB  0   Living  2      SAB  0   IAB  0   Ectopic  0   Multiple  0   Live Births             Patient denies any cervical dysplasia or STIs. Medications Prior to Admission  Medication Sig Dispense Refill Last Dose   Accu-Chek Softclix Lancets lancets Check blood sugar up to 3 times  day 300 each 3 12/16/2021   acetaminophen (TYLENOL) 500 MG tablet Take 1,000 mg by mouth every 6 (six) hours as needed (for pain.).   12/16/2021   albuterol (PROVENTIL HFA) 108 (90 Base) MCG/ACT inhaler Inhale 2 puffs into the lungs every 4 (four) hours as needed for coughing, wheezing, or shortness of breath. 20.1 g 2 Past Week   amoxicillin (AMOXIL) 500 MG capsule Take 1 capsule (500 mg total) by mouth every 8 (eight) hours until gone. 21 capsule 0 12/16/2021   aspirin EC 81 MG tablet Take 1 tablet (81 mg total) by mouth daily. Swallow whole. 90 tablet 3 12/16/2021   blood glucose meter kit and supplies Dispense based on patient and insurance preference. Use up to four times daily as directed. (FOR ICD-10 E10.9, E11.9). 1 each 0 12/16/2021   Blood Glucose Monitoring Suppl (ACCU-CHEK GUIDE) w/Device KIT 1 each by Does not apply route 3 (three) times daily. 1 kit 0 12/16/2021   Blood Pressure Monitor MISC USE AS DIRECTED FOUR TIMES DAILY. 1 each 0 12/16/2021   cetirizine (ZYRTEC) 5 MG tablet Take 1 tablet (5 mg total) by mouth daily. 30 tablet 0 12/16/2021   Cholecalciferol (VITAMIN D3) 50 MCG (2000 UT) CAPS Take 4 capsules (8,000 Units total) by mouth daily. 360 capsule 3 12/16/2021   Continuous Blood Gluc Receiver (DEXCOM G6 RECEIVER) DEVI Use to check blood sugar at least 6 times a day 1 each 0 12/16/2021   Continuous Blood Gluc Sensor (DEXCOM G6 SENSOR) MISC Use to check blood sugar at least 6 times a day 9 each 3 12/16/2021   Continuous Blood Gluc Transmit (DEXCOM G6 TRANSMITTER) MISC Use to check blood sugar at least 6  times a day 1 each 3 12/16/2021   diclofenac Sodium (VOLTAREN) 1 % GEL APPLY 4 G TOPICALLY 4 (FOUR) TIMES DAILY. 100 g 0 Past Week   fluticasone (FLONASE) 50 MCG/ACT nasal spray Place 2 sprays into both nostrils every night 16 g 5 12/16/2021   gabapentin (NEURONTIN) 100 MG capsule Take 1 capsule (100 mg total) by mouth at bedtime. 30 capsule 0 12/15/2021   glucose blood (ACCU-CHEK GUIDE) test strip Check blood sugar up to 3 times  day 300 each 3 12/16/2021   ibuprofen (ADVIL) 800 MG tablet Take 1 tablet (800 mg total) by mouth every 6 (six)  hours as needed for pain. 11 tablet 0 12/16/2021   icosapent Ethyl (VASCEPA) 1 g capsule Take 2 capsules (2 g total) by mouth 2 (two) times daily. 120 capsule 5 12/16/2021   injection device for insulin (INPEN 100-PINK-LILLY-HUMALOG) DEVI Use to inject Humalog insulin for meals and correction insulin 1 each 1 12/16/2021   insulin degludec (TRESIBA) 100 UNIT/ML FlexTouch Pen Inject 24 Units into the skin daily. 15 mL 1 12/16/2021   insulin lispro (HUMALOG) 100 UNIT/ML cartridge Use with inpen to inject at meal time and for correction insulin up to 40 units daily 15 mL 11 12/16/2021   Insulin Pen Needle 32G X 4 MM MISC Use to inject insulin 4 times a day 400 each 3 12/16/2021   Insulin Syringe-Needle U-100 31G X 15/64" 0.3 ML MISC Use to inject insulin daily 100 each 3 12/16/2021   lanthanum (FOSRENOL) 1000 MG chewable tablet Chew 1 tablet (1,000 mg total) by mouth 3 (three) times daily with meals. 270 tablet 4 12/16/2021   megestrol (MEGACE) 40 MG tablet Take 1 tablet (40 mg total) by mouth daily. Can increase to two tablets twice a day in the event of heavy bleeding 30 tablet 3 12/16/2021   metoprolol tartrate (LOPRESSOR) 25 MG tablet Take 1 tablet (25 mg total) by mouth 2 (two) times daily. Do not take the night before dialysis, and take 2 hours after dialysis. 180 tablet 3 12/16/2021   multivitamin (RENA-VIT) TABS tablet Take 1 tablet by mouth daily.  3 12/16/2021   oxyCODONE-acetaminophen  (PERCOCET) 10-325 MG tablet Take 1 tablet by mouth 5 (five) times daily as needed 152 tablet 0 Past Week   oxyCODONE-acetaminophen (PERCOCET) 10-325 MG tablet Take 1 tablet by mouth 5 (five) times daily as needed 152 tablet 0 Past Week   rosuvastatin (CRESTOR) 40 MG tablet Take 1 tablet (40 mg total) by mouth daily. 90 tablet 3 12/15/2021   Semaglutide,0.25 or 0.5MG /DOS, (OZEMPIC, 0.25 OR 0.5 MG/DOSE,) 2 MG/1.5ML SOPN Inject 0.5 mg into the skin once a week. 1.5 mL 0 Past Week   sertraline (ZOLOFT) 100 MG tablet Take 0.5 tablets (50 mg total) by mouth daily. 15 tablet 2 12/16/2021   sevelamer carbonate (RENVELA) 800 MG tablet Take 3,200 mg by mouth 3 (three) times daily with meals. Take 800 mg with snacks as needed   Past Week   HYDROcodone bit-homatropine (HYCODAN) 5-1.5 MG/5ML syrup Take 5 mLs by mouth every 6 (six) hours as needed for cough. 120 mL 0    Sodium Fluoride 1.1 % PSTE Brush teeth with small amount 2 times a day 100 mL 5     Allergies  Allergen Reactions   Adhesive [Tape] Other (See Comments)    Irritation    Social History:   reports that she quit smoking about 6 years ago. Her smoking use included cigarettes. She has never used smokeless tobacco. She reports current alcohol use. She reports current drug use. Drug: Marijuana. Family History  Problem Relation Age of Onset   Stroke Mother    Diabetes Mother    Hypertension Mother    Aneurysm Mother    Diabetes Father    Hypertension Father    Diabetes Sister    Hypertension Sister    Diabetes Maternal Grandmother    Diabetes Maternal Grandfather    Cancer Paternal Grandfather        Prostate    Review of Systems: Noncontributory  PHYSICAL EXAM: Height 5\' 5"  (1.651 m), weight 102.1 kg. General appearance - alert,  well appearing, and in no distress Chest - clear to auscultation, no wheezes, rales or rhonchi, symmetric air entry Heart - normal rate and regular rhythm Abdomen - soft, nontender, nondistended, no masses or  organomegaly Pelvic - examination not indicated Extremities - peripheral pulses normal, no pedal edema, no clubbing or cyanosis  Labs:  09/15/2020 SURGICAL PATHOLOGY  CASE: [id number]  PATIENT: [name]  Surgical Pathology Report      Clinical History: AUB (crm)      FINAL MICROSCOPIC DIAGNOSIS:   A. ENDOMETRIUM, CURETTAGE:  - Benign inactive endometrium with hormone-effect  - Adenomyosis  - Negative for hyperplasia or malignancy  Imaging Studies: No results found.  Assessment: Patient Active Problem List   Diagnosis Date Noted   Acute respiratory failure with hypoxia ([place]) 11/09/2021   Sepsis ([place]) 11/09/2021   Influenza A 11/09/2021   Community acquired pneumonia 11/09/2021   Hyperkalemia 11/09/2021   Hypertensive urgency 11/08/2021   Preop cardiovascular exam 10/14/2021   Chest congestion 07/30/2021   Intractable nausea and vomiting 06/03/2021   Grief 04/01/2021   Diabetic neuropathy ([place]) 01/29/2021   Neck pain 01/07/2021   Hypertensive heart disease with chronic diastolic congestive heart failure ([place]) 01/06/2021   Left lateral abdominal pain 02/02/2020   Arthritis of left knee 02/11/2019   Hidradenitis suppurativa 02/12/2018   Anemia in chronic kidney disease 02/12/2018   Secondary hyperparathyroidism of renal origin ([place]) 01/30/2018   Coagulation defect, unspecified ([place]) 01/18/2018   Major depressive disorder 08/11/2017   Sinus tachycardia 02/23/2017   ESRD (end stage renal disease) on dialysis ([place]) 01/05/2017   CAD S/P BMS PCI to prox LAD 03/31/2016   Abnormal uterine bleeding (AUB) 03/17/2016   PAOD (peripheral arterial occlusive disease) ([place]) 01/08/2016   Charcot foot due to diabetes mellitus ([place]) 09/07/2015   Peripheral neuropathy 09/20/2014   Essential hypertension 08/16/2014   Seasonal allergic rhinitis 08/16/2014   Candidal vulvovaginitis 02/09/2014   Morbid obesity ([place]) 09/26/2012   Hyperlipidemia associated with type 2  diabetes mellitus ([place]) 02/26/2009   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, with long-term current use of insulin ([place]) 05/08/2007   Asthma 05/08/2007    Plan: Patient will undergo surgical management with hysteroscopy with dilation and curettage and endometrial ablation using Novasure .   The risks of surgery were discussed in detail with the patient including but not limited to: bleeding which may require transfusion or reoperation; infection which may require antibiotics; injury to surrounding organs which may involve bowel, bladder, ureters ; need for additional procedures including laparoscopy or laparotomy; thromboembolic phenomenon, surgical site problems and other postoperative/anesthesia complications. Likelihood of success in alleviating the patient's condition was discussed. Routine postoperative instructions will be reviewed with the patient and her family in detail after surgery.  The patient concurred with the proposed plan, giving informed written consent for the surgery.  Patient has been NPO since last night she will remain NPO for procedure.  Anesthesia and OR aware.  Preoperative prophylactic antibiotics and SCDs ordered on call to the OR.  To OR when ready.  Johnathin Vanderschaaf L. Ihor Dow, M.D., [facility] 12/21/2021 11:38 AM

## 2021-12-21 NOTE — Anesthesia Procedure Notes (Signed)
Procedure Name: LMA Insertion Date/Time: 12/21/2021 1:13 PM Performed by: Verita Lamb, CRNA Pre-anesthesia Checklist: Patient identified, Emergency Drugs available, Suction available and Patient being monitored Patient Re-evaluated:Patient Re-evaluated prior to induction Oxygen Delivery Method: Circle system utilized Preoxygenation: Pre-oxygenation with 100% oxygen Induction Type: IV induction Ventilation: Mask ventilation without difficulty LMA: LMA inserted LMA Size: 4.0 Number of attempts: 1 Airway Equipment and Method: Bite block Placement Confirmation: positive ETCO2, CO2 detector and breath sounds checked- equal and bilateral Tube secured with: Tape Dental Injury: Teeth and Oropharynx as per pre-operative assessment

## 2021-12-21 NOTE — Transfer of Care (Signed)
Immediate Anesthesia Transfer of Care Note  Patient: Jane [name]  Procedure(s) Performed: DILATATION & CURETTAGE/HYSTEROSCOPY WITH NOVASURE ABLATION/ IUD REMOVAL (Uterus)  Patient Location: PACU  Anesthesia Type:General  Level of Consciousness: awake, alert  and oriented  Airway & Oxygen Therapy: Patient Spontanous Breathing and Patient connected to face mask oxygen  Post-op Assessment: Report given to RN and Post -op Vital signs reviewed and stable  Post vital signs: Reviewed and stable  Last Vitals:  Vitals Value Taken Time  BP 166/92 12/21/21 1335  Temp 36.7 C 12/21/21 1340  Pulse 85 12/21/21 1340  Resp 19 12/21/21 1340  SpO2 100 % 12/21/21 1340  Vitals shown include unvalidated device data.  Last Pain:  Vitals:   12/21/21 1153  TempSrc: Oral  PainSc: 6       Patients Stated Pain Goal: 2 (65/78/46 [id number])  Complications: No notable events documented.

## 2021-12-21 NOTE — Op Note (Signed)
12/21/2021  1:32 PM  PATIENT:  Jane [name]  46 y.o. female  PRE-OPERATIVE DIAGNOSIS:  AUB  POST-OPERATIVE DIAGNOSIS:  AUB  PROCEDURE:  Procedure(s): DILATATION & CURETTAGE/HYSTEROSCOPY WITH NOVASURE ABLATION/ IUD REMOVAL (N/A)  SURGEON:  Surgeon(s) and Role:    * Lavonia Drafts, MD - Primary  ANESTHESIA:   general and paracervical block  EBL:  minimal   BLOOD ADMINISTERED:none  DRAINS: none   LOCAL MEDICATIONS USED:  MARCAINE     SPECIMEN:  Source of Specimen:  endometrial curetting   DISPOSITION OF SPECIMEN:  PATHOLOGY  COUNTS:  YES  TOURNIQUET:  * No tourniquets in log *  DICTATION: .Note written in EPIC  PLAN OF CARE: Discharge to home after PACU  PATIENT DISPOSITION:  PACU - hemodynamically stable.   Delay start of Pharmacological VTE agent (>24hrs) due to surgical blood loss or risk of bleeding: not applicable  Complications: none immediate.   Indications: Pt with AUB despite Megace and LnIUD. Prev biopsies were negative for malignancy.   Procedure: The risks, benefits, and alternatives of surgery were explained, understood, and accepted. The consents were signed and all questions were answered. She was taken to the operating room and general anesthesia was applied without complication. She was placed in the dorsal lithotomy position and her vagina and abdomen were prepped and draped in the usual sterile fashion. A bimanual exam revealed a normal size and shape anteverted mobile uterus. Her adnexa were non-enlarged.   A bivalved speculum was placed in the patients' vagina and the anterior lip of the cervix was grasped with a single toothed tenaculum. A paracervical block was performed at 5 and 7 o'clock with 20cc of 0.5% Marcaine.   The endometrial cavity was sounded to 9.5cm and the endocervical length measured 3cm. A hysteroscope was inserted and the endometrium was noted to be thickened no polyps were noted. A LnIUD was in the endometrial cavity  but the string was going in the direction of the fundus. The IUD was removed suing hysteroscopic graspers. The ostia on both sides were noted.  The scope was removed and a sharp curette was used to scape the lining of the uterus until a gritty texture was noted throughout.  Specimens were sent to pathology.  The NovaSure device was then inserted and seated using 6.5cm as the cavity length and 2.7cm as the cavity width.  The total activation time was 65 sec at a power of 97.  The hysteroscope was reinserted and an even burn pattern was noted to the fundus.  The single toothed tenaculum was removed at the end of the case and no bleeding was noted from the cervix.   The patient was extubated and taken to the recovery room in stable condition.  Sponge, lap and instrument counts were correct.  There were no complications.  The patient will be discharged to home. Routine postoperative instructions given.  She will follow up in the clinic in 4 weeks for postoperative evaluation.   Beckie Viscardi L. Harraway-Smith, M.D., [name]

## 2021-12-21 NOTE — Discharge Instructions (Addendum)

## 2021-12-21 NOTE — Anesthesia Postprocedure Evaluation (Signed)
Anesthesia Post Note  Patient: Jane [name]  Procedure(s) Performed: DILATATION & CURETTAGE/HYSTEROSCOPY WITH NOVASURE ABLATION/ IUD REMOVAL (Uterus)     Anesthesia Post Evaluation No notable events documented.  Last Vitals:  Vitals:   12/21/21 1153 12/21/21 1340  BP: (!) 153/85   Pulse: 94 87  Resp: 17 16  Temp: 37.3 C 36.7 C  SpO2: 100% 100%    Last Pain:  Vitals:   12/21/21 1153  TempSrc: Oral  PainSc: 6                  Pete Schnitzer

## 2021-12-21 NOTE — Anesthesia Preprocedure Evaluation (Addendum)
Anesthesia Evaluation  Patient identified by MRN, date of birth, ID band Patient awake    Reviewed: Allergy & Precautions, NPO status , Patient's Chart, lab work & pertinent test results  Airway Mallampati: I       Dental no notable dental hx.    Pulmonary asthma , former smoker,    Pulmonary exam normal        Cardiovascular hypertension, Pt. on medications and Pt. on home beta blockers + CAD, + Past MI and + Cardiac Stents  Normal cardiovascular exam     Neuro/Psych  Headaches, PSYCHIATRIC DISORDERS Depression  Neuromuscular disease    GI/Hepatic negative GI ROS,   Endo/Other  diabetes, Type 2, Insulin Dependent  Renal/GU ESRF and DialysisRenal disease  negative genitourinary   Musculoskeletal  (+) Arthritis ,   Abdominal (+) + obese,   Peds  Hematology  (+) Blood dyscrasia, Sickle cell trait and anemia ,   Anesthesia Other Findings   Reproductive/Obstetrics                             Anesthesia Physical  Anesthesia Plan  ASA: 3  Anesthesia Plan: General   Post-op Pain Management:    Induction: Intravenous  PONV Risk Score and Plan: 3 and Ondansetron and Treatment may vary due to age or medical condition  Airway Management Planned: LMA  Additional Equipment: None  Intra-op Plan:   Post-operative Plan:   Informed Consent: I have reviewed the patients History and Physical, chart, labs and discussed the procedure including the risks, benefits and alternatives for the proposed anesthesia with the patient or authorized representative who has indicated his/her understanding and acceptance.     Dental advisory given  Plan Discussed with: CRNA and Anesthesiologist  Anesthesia Plan Comments: ( )        Anesthesia Quick Evaluation

## 2021-12-21 NOTE — Brief Op Note (Signed)
12/21/2021  1:32 PM  PATIENT:  [name]  46 y.o. female  PRE-OPERATIVE DIAGNOSIS:  AUB  POST-OPERATIVE DIAGNOSIS:  AUB  PROCEDURE:  Procedure(s): DILATATION & CURETTAGE/HYSTEROSCOPY WITH NOVASURE ABLATION/ IUD REMOVAL (N/A)  SURGEON:  Surgeon(s) and Role:    * Lavonia Drafts, MD - Primary  ANESTHESIA:   general and paracervical block  EBL:  minimal   BLOOD ADMINISTERED:none  DRAINS: none   LOCAL MEDICATIONS USED:  MARCAINE     SPECIMEN:  Source of Specimen:  endometrial curetting   DISPOSITION OF SPECIMEN:  PATHOLOGY  COUNTS:  YES  TOURNIQUET:  * No tourniquets in log *  DICTATION: .Note written in EPIC  PLAN OF CARE: Discharge to home after PACU  PATIENT DISPOSITION:  PACU - hemodynamically stable.   Delay start of Pharmacological VTE agent (>24hrs) due to surgical blood loss or risk of bleeding: not applicable  Complications: none immediate.   Jaylean Buenaventura L. Harraway-Smith, M.D., [name]

## 2021-12-22 LAB — SURGICAL PATHOLOGY

## 2021-12-23 ENCOUNTER — Encounter (HOSPITAL_BASED_OUTPATIENT_CLINIC_OR_DEPARTMENT_OTHER): Payer: Self-pay | Admitting: Obstetrics & Gynecology

## 2021-12-24 ENCOUNTER — Other Ambulatory Visit (HOSPITAL_COMMUNITY): Payer: Self-pay

## 2021-12-24 MED ORDER — OXYCODONE-ACETAMINOPHEN 10-325 MG PO TABS
1.0000 | ORAL_TABLET | Freq: Every day | ORAL | 0 refills | Status: DC
  Filled 2021-12-24: qty 147, 29d supply, fill #0

## 2021-12-27 ENCOUNTER — Telehealth: Payer: Self-pay

## 2021-12-27 NOTE — Telephone Encounter (Signed)
-----   Message from Lavonia Drafts, MD sent at 12/27/2021 11:56 AM EST ----- Please call pt. Her pathology was negative. She should stop the Megace.   Thank you.   Clh-S

## 2021-12-27 NOTE — Telephone Encounter (Signed)
Called patient and made her aware that her pathology came back negative. Patient made aware that she should stop her megace. Patient states understanding. Patient given reminder of follow up postop appointment. Kathrene Alu RN

## 2022-01-05 ENCOUNTER — Other Ambulatory Visit: Payer: Self-pay

## 2022-01-05 ENCOUNTER — Encounter: Payer: Self-pay | Admitting: Obstetrics & Gynecology

## 2022-01-05 ENCOUNTER — Ambulatory Visit (INDEPENDENT_AMBULATORY_CARE_PROVIDER_SITE_OTHER): Payer: 59 | Admitting: Obstetrics & Gynecology

## 2022-01-05 VITALS — BP 116/43 | HR 88

## 2022-01-05 DIAGNOSIS — N939 Abnormal uterine and vaginal bleeding, unspecified: Secondary | ICD-10-CM

## 2022-01-05 NOTE — Progress Notes (Signed)
Patient states she moved furniture and it has caused bleeding and cramping. Kathrene Alu RN

## 2022-01-05 NOTE — Progress Notes (Signed)
History:  46 y.o.LMP here today for 2 week post op check.Pt is s/p hysteroscopy with bilateral salpingectomy on 12/21/2021.  Pt reports that she is doing well. She was moving furniture post procedure and noted bleeding and it made her worried. She reports that she had stopped bleeding prior to that.  Pt denies pain or related sx.    The following portions of the patient's history were reviewed and updated as appropriate: allergies, current medications, past family history, past medical history, past social history, past surgical history and problem list.  Review of Systems:  Pertinent items are noted in HPI.    Objective:  Physical Exam BP (!) 116/43    Pulse 88   CONSTITUTIONAL: Well-developed, well-nourished female in no acute distress.  HENT:  Normocephalic, atraumatic EYES: Conjunctivae and EOM are normal. No scleral icterus.  NECK: Normal range of motion SKIN: Skin is warm and dry. No rash noted. Not diaphoretic.No pallor. [place]: Alert and oriented to person, place, and time. Normal coordination.  Pelvic: deferred  Labs and Imaging Surg path2/05/2022 ENDOMETRIUM, CURETTAGE:  Chronic endometritis  Marked progestational effect  Stromal breakdown consistent with bleeding  Negative for polyp, hyperplasia and carcinoma    Assessment & Plan:  2 week post op check following hysteroscopy with bilateral salpingectomy on 12/21/2021 now with bleeding. Pt educated that bleeding post procedure is normal. She may bleed up to 6 weeks. Likely unrelated to the moving of furniture.  Doing well  Reviewed her surg path.   Reviewed post op instructions and activities  Gradual increase in activities  F/u in 3 months or sooner prn  Reviewed no intercourse  or water aerobics for 4 weeks post op  All questions answered.    Jane [name] L. Harraway-Smith, M.D., [name]

## 2022-01-19 ENCOUNTER — Encounter: Payer: 59 | Admitting: Obstetrics & Gynecology

## 2022-01-20 ENCOUNTER — Other Ambulatory Visit (HOSPITAL_COMMUNITY): Payer: Self-pay

## 2022-01-21 ENCOUNTER — Other Ambulatory Visit (HOSPITAL_COMMUNITY): Payer: Self-pay

## 2022-01-21 MED ORDER — OXYCODONE-ACETAMINOPHEN 10-325 MG PO TABS
1.0000 | ORAL_TABLET | Freq: Every day | ORAL | 0 refills | Status: DC
Start: 2022-01-21 — End: 2022-02-28
  Filled 2022-01-21: qty 150, 30d supply, fill #0

## 2022-02-10 ENCOUNTER — Ambulatory Visit: Payer: Medicare Other | Admitting: Podiatry

## 2022-02-15 ENCOUNTER — Ambulatory Visit (INDEPENDENT_AMBULATORY_CARE_PROVIDER_SITE_OTHER): Payer: 59 | Admitting: Podiatry

## 2022-02-15 DIAGNOSIS — M2141 Flat foot [pes planus] (acquired), right foot: Secondary | ICD-10-CM | POA: Diagnosis not present

## 2022-02-15 DIAGNOSIS — E083413 Diabetes mellitus due to underlying condition with severe nonproliferative diabetic retinopathy with macular edema, bilateral: Secondary | ICD-10-CM | POA: Diagnosis not present

## 2022-02-15 DIAGNOSIS — E119 Type 2 diabetes mellitus without complications: Secondary | ICD-10-CM

## 2022-02-15 DIAGNOSIS — Z794 Long term (current) use of insulin: Secondary | ICD-10-CM

## 2022-02-15 DIAGNOSIS — M2142 Flat foot [pes planus] (acquired), left foot: Secondary | ICD-10-CM

## 2022-02-15 DIAGNOSIS — G63 Polyneuropathy in diseases classified elsewhere: Secondary | ICD-10-CM | POA: Diagnosis not present

## 2022-02-16 ENCOUNTER — Encounter: Payer: Self-pay | Admitting: General Practice

## 2022-02-17 ENCOUNTER — Encounter (HOSPITAL_COMMUNITY): Payer: Self-pay

## 2022-02-17 ENCOUNTER — Other Ambulatory Visit (HOSPITAL_COMMUNITY): Payer: Self-pay

## 2022-02-17 ENCOUNTER — Ambulatory Visit (INDEPENDENT_AMBULATORY_CARE_PROVIDER_SITE_OTHER): Payer: 59 | Admitting: Internal Medicine

## 2022-02-17 DIAGNOSIS — J069 Acute upper respiratory infection, unspecified: Secondary | ICD-10-CM | POA: Diagnosis not present

## 2022-02-17 MED ORDER — BENZONATATE 100 MG PO CAPS
200.0000 mg | ORAL_CAPSULE | Freq: Three times a day (TID) | ORAL | 0 refills | Status: DC | PRN
  Filled 2022-02-17: qty 20, 4d supply, fill #0

## 2022-02-17 MED ORDER — GUANFACINE HCL ER 1 MG PO TB24
1.0000 mg | ORAL_TABLET | Freq: Every day | ORAL | 0 refills | Status: DC
  Filled 2022-02-17: qty 30, 30d supply, fill #0

## 2022-02-17 NOTE — Progress Notes (Signed)
?  [facility] Telephone Encounter ?Continuity Care Appointment ? ?HPI:  ?This telephone encounter was created for [name] on 02/17/2022 for the following purpose/cc mucous.  ? ?Past Medical History:  ?Past Medical History:  ?Diagnosis Date  ? Abnormal uterine bleeding (AUB)   ? Arthritis of knee   ? Left, Gel and cortisone injections @ [facility]  ? Asthma   ? prn inhaler  ? CAD S/P BMS PCI to prox LAD cardiologist--- dr [name]  ? Ost LAD to Mid LAD lesion, 90% stenosed. Post intervention - Vision BMS 3.0 mm x 18 mm (~3.5 mm) there is a 0% residual stenosis. ;   nuclear stress test-- 09-13-2018 low risk with no ischemia, nuclear ef 56%  ? Charcot foot due to diabetes mellitus ([place]) 11/2016  ? left 2018; right 2019  ? Depression   ? Diabetic foot ulcer ([place]) 04/22/2019  ? Diabetic neuropathy ([place])   ? feet  ? Diabetic retinopathy, nonproliferative, severe ([place])   ? bilateral  ? ESRD (end stage renal disease) on dialysis [facility])   ? ?chronic interstitial nephritis  ([facility]  ? H/O non-ST elevation myocardial infarction (NSTEMI) 03/2016  ? Found in 90% mid LAD lesion treated with bare-metal stent (BMS) PCI - vision BMS 3.0 mm x 18 mm  ? History of MRSA infection 2007  ? Hyperlipidemia   ? Hypertension   ? IDA (iron deficiency anemia)   ? Insulin dependent type 2 diabetes mellitus ([place])   ? Iron deficiency anemia   ? takes iron supplement  ? 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 PAOD (peripheral arterial occlusive disease) ([place])   ? vascular--- dr Bridgett Larsson  ? S/P arteriovenous (AV) fistula creation 07/2017  ? S/p bare metal coronary artery stent 03/31/2016  ? BMS x1  to pLAD  ? Sickle cell trait ([place])   ? Ulcer of left foot due to type 2 diabetes mellitus ([place]) 06/03/2019  ?  ? ?ROS:  ?Positive for malaise, low grade fevers, cough, runny nose, negative for shortness of breath, difficulty breathing  ? ?Assessment / Plan /  Recommendations:  ?Please see A&P under problem oriented charting for assessment of the patient's acute and chronic medical conditions.  ?As always, pt is advised that if symptoms worsen or new symptoms arise, they should go to an urgent care facility or to to ER for further evaluation.  ? ?Consent and Medical Decision Making:  ?Patient discussed with Dr.  Cain Sieve ?This is a telephone encounter between Jane [name] and [place] on 02/17/2022 for mucous. The visit was conducted with the patient located at home and [facility] at [facility]. The patient's identity was confirmed using their DOB and current address. The patient has consented to being evaluated through a telephone encounter and understands the associated risks (an examination cannot be done and the patient may need to come in for an appointment) / benefits (allows the patient to remain at home, decreasing exposure to coronavirus). I personally spent 15 minutes on medical discussion.   ? ? ?

## 2022-02-17 NOTE — Assessment & Plan Note (Addendum)
Patient describes coming down with a cold a week ago. She has a runny nose, sore throat, cough, mucous production, low grade fevers (100) and malaise. She had some lightheadedness over the weekend and laid around for a lot of the day but she was able to get dressed and move around today. Not having any shortness of breath or difficulty breathing. Grandson at home is also sick. She is vaccinated for both the flu and COVID, though she has not taken a test for either of these since getting sick.  ? ?A/P ?- suspect viral URI based on telehealth evaluation, recommend conservative management ?- tessalon perles, guanfacine, neti pot ?- return precautions discussed. ?

## 2022-02-18 NOTE — Progress Notes (Signed)
Internal Medicine Clinic Attending ° °Case discussed with Dr. DeMaio  At the time of the visit.  We reviewed the resident’s history and exam and pertinent patient test results.  I agree with the assessment, diagnosis, and plan of care documented in the resident’s note. ° ° °

## 2022-02-19 NOTE — Progress Notes (Signed)
?  Subjective:  ?Patient ID: Jane [name], female    DOB: 05/16/1976,  MRN: [id number] ? ?Chief Complaint  ?Patient presents with  ? Diabetes  ?  Annual diabetic foot exam  ? ? ?46 y.o. female presents with the above complaint. History confirmed with patient.  Overall doing well.  She is having some knee problems.  Has had no recurrence of ulceration, no signs of infection. ? ?Objective:  ?Physical Exam: ?warm, good capillary refill, no trophic changes or ulcerative lesions and normal DP and PT pulses.  Absent protective sensation.  She is rocker-bottom foot deformity bilaterally with no evidence of ulceration or preulcerative callus.  Xerosis cutis noted.  ?Assessment:  ? ?1. 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 Polyneuropathy associated with underlying disease ([place])   ?3. Pes planus of both feet   ?4. Encounter for diabetic foot exam ([place])   ? ? ? ? ?Plan:  ?Patient was evaluated and treated and all questions answered. ? ?Patient educated on diabetes. Discussed proper diabetic foot care and discussed risks and complications of disease. Educated patient in depth on reasons to return to the office immediately should he/she discover anything concerning or new on the feet. All questions answered. Discussed proper shoes as well.  ? ? ? ?Return in about 1 year (around 02/16/2023) for diabetic foot exam.  ? ?

## 2022-02-24 ENCOUNTER — Other Ambulatory Visit (HOSPITAL_COMMUNITY): Payer: Self-pay

## 2022-02-24 ENCOUNTER — Encounter: Payer: Self-pay | Admitting: Obstetrics & Gynecology

## 2022-02-24 MED ORDER — OXYCODONE-ACETAMINOPHEN 10-325 MG PO TABS
1.0000 | ORAL_TABLET | Freq: Every day | ORAL | 0 refills | Status: DC
  Filled 2022-02-24: qty 150, 30d supply, fill #0

## 2022-02-28 ENCOUNTER — Encounter (HOSPITAL_COMMUNITY): Payer: Self-pay | Admitting: Emergency Medicine

## 2022-02-28 ENCOUNTER — Emergency Department (HOSPITAL_COMMUNITY)
Admission: EM | Admit: 2022-02-28 | Discharge: 2022-02-28 | Disposition: A | Discharge status: Home / Self Care | Payer: 59 | Attending: Emergency Medicine | Admitting: Emergency Medicine

## 2022-02-28 ENCOUNTER — Telehealth: Payer: Self-pay

## 2022-02-28 ENCOUNTER — Other Ambulatory Visit: Payer: Self-pay

## 2022-02-28 DIAGNOSIS — Z794 Long term (current) use of insulin: Secondary | ICD-10-CM | POA: Diagnosis not present

## 2022-02-28 DIAGNOSIS — Z7982 Long term (current) use of aspirin: Secondary | ICD-10-CM | POA: Diagnosis not present

## 2022-02-28 DIAGNOSIS — Z992 Dependence on renal dialysis: Secondary | ICD-10-CM | POA: Diagnosis not present

## 2022-02-28 DIAGNOSIS — Z79899 Other long term (current) drug therapy: Secondary | ICD-10-CM | POA: Insufficient documentation

## 2022-02-28 DIAGNOSIS — N939 Abnormal uterine and vaginal bleeding, unspecified: Secondary | ICD-10-CM | POA: Insufficient documentation

## 2022-02-28 DIAGNOSIS — N9489 Other specified conditions associated with female genital organs and menstrual cycle: Secondary | ICD-10-CM | POA: Diagnosis not present

## 2022-02-28 DIAGNOSIS — N186 End stage renal disease: Secondary | ICD-10-CM | POA: Diagnosis not present

## 2022-02-28 LAB — LIPASE, BLOOD: Lipase: 32 U/L (ref 11–51)

## 2022-02-28 LAB — COMPREHENSIVE METABOLIC PANEL
ALT: 10 U/L (ref 0–44)
AST: 12 U/L — ABNORMAL LOW (ref 15–41)
Albumin: 3.2 g/dL — ABNORMAL LOW (ref 3.5–5.0)
Alkaline Phosphatase: 91 U/L (ref 38–126)
Anion gap: 13 (ref 5–15)
BUN: 40 mg/dL — ABNORMAL HIGH (ref 6–20)
CO2: 24 mmol/L (ref 22–32)
Calcium: 8.4 mg/dL — ABNORMAL LOW (ref 8.9–10.3)
Chloride: 99 mmol/L (ref 98–111)
Creatinine, Ser: 13.42 mg/dL — ABNORMAL HIGH (ref 0.44–1.00)
GFR, Estimated: 3 mL/min — ABNORMAL LOW (ref >60)
Glucose, Bld: 197 mg/dL — ABNORMAL HIGH (ref 70–99)
Potassium: 4.2 mmol/L (ref 3.5–5.1)
Sodium: 136 mmol/L (ref 135–145)
Total Bilirubin: 0.4 mg/dL (ref 0.3–1.2)
Total Protein: 6.8 g/dL (ref 6.5–8.1)

## 2022-02-28 LAB — CBC
HCT: 29.4 % — ABNORMAL LOW (ref 36.0–46.0)
Hemoglobin: 9.1 g/dL — ABNORMAL LOW (ref 12.0–15.0)
MCH: 26.4 pg (ref 26.0–34.0)
MCHC: 31 g/dL (ref 30.0–36.0)
MCV: 85.2 fL (ref 80.0–100.0)
Platelets: 253 10*3/uL (ref 150–400)
RBC: 3.45 MIL/uL — ABNORMAL LOW (ref 3.87–5.11)
RDW: 15.7 % — ABNORMAL HIGH (ref 11.5–15.5)
WBC: 6.9 10*3/uL (ref 4.0–10.5)
nRBC: 0 % (ref 0.0–0.2)

## 2022-02-28 LAB — I-STAT BETA HCG BLOOD, ED (MC, WL, AP ONLY): I-stat hCG, quantitative: <5 m[IU]/mL (ref <5)

## 2022-02-28 MED ORDER — MEGESTROL ACETATE 40 MG PO TABS
40.0000 mg | ORAL_TABLET | Freq: Every day | ORAL | Status: DC
  Administered 2022-02-28: 40 mg via ORAL
  Filled 2022-02-28: qty 1

## 2022-02-28 MED ORDER — MEGESTROL ACETATE 40 MG PO TABS
40.0000 mg | ORAL_TABLET | Freq: Two times a day (BID) | ORAL | 0 refills | Status: DC
Start: 2022-02-28 — End: 2022-04-06
  Filled 2022-03-01: qty 60, 30d supply, fill #0

## 2022-02-28 NOTE — ED Provider Triage Note (Signed)
Emergency Medicine Provider Triage Evaluation Note ? ?Jane [name] , a 46 y.o. female  was evaluated in triage.  Pt complains of vaginal bleeding.  Pt had a uterine ablation Pt is concerned that she is becoming anemic  ? ?Review of Systems  ?Positive: Vaginal bleeding ?Negative: Abdominal pain ? ?Physical Exam  ?BP (!) 152/74 (BP Location: Left Arm)   Pulse 87   Temp 99.3 ?F (37.4 ?C) (Oral)   Resp 15   SpO2 100%  ?Gen:   Awake, no distress   ?Resp:  Normal effort  ?MSK:   Moves extremities without difficulty  ?Other:   ? ?Medical Decision Making  ?Medically screening exam initiated at 9:25 AM.  Appropriate orders placed.  Jane [name] was informed that the remainder of the evaluation will be completed by another provider, this initial triage assessment does not replace that evaluation, and the importance of remaining in the ED until their evaluation is complete. ? ? ?  ?Fransico Meadow, [place] ?02/28/22 0277 ? ?

## 2022-02-28 NOTE — Telephone Encounter (Signed)
Pt is requesting back .. she  is in the ed and she is wanting to know you ?some one can tell her what is going on with her I stated that once you are in the ED you are under the care of the stuff up there she stated she knows but still tell the RN to call her and if it is not today she will ask the RN up there  ?

## 2022-02-28 NOTE — ED Provider Notes (Signed)
?[facility] ?Provider Note ? ? ?CSN: [id number] ?Arrival date & time: 02/28/22  0923 ? ?  ? ?History ? ?Chief Complaint  ?Patient presents with  ? Vaginal Bleeding  ? ? ?Jane [name] is a 46 y.o. female history of menorrhagia, ESRD on dialysis (last HD was 3 days ago) here presenting with vaginal bleeding.  Patient states for the last 4 to 5 days, she has been having heavy menses. She states that she uses about 10 pads a day.  She is feeling lightheaded and dizzy.  Patient had uterine ablation done by Dr. Ihor Dow in the beginning of February.  She states that she was taken off Megace soon after the surgery. ? ?The history is provided by the patient.  ? ?  ? ?Home Medications ?Prior to Admission medications   ?Medication Sig Start Date End Date Taking? Authorizing Provider  ?Accu-Chek Softclix Lancets lancets Check blood sugar up to 3 times  day 02/14/21   Mosetta Anis, MD  ?acetaminophen (TYLENOL) 500 MG tablet Take 1,000 mg by mouth every 6 (six) hours as needed (for pain.).    [provider]  ?albuterol (PROVENTIL HFA) 108 (90 Base) MCG/ACT inhaler Inhale 2 puffs into the lungs every 4 (four) hours as needed for coughing, wheezing, or shortness of breath. 11/11/21   Barb Merino, MD  ?amoxicillin (AMOXIL) 500 MG capsule Take 1 capsule (500 mg total) by mouth every 8 (eight) hours until gone. ?Patient not taking: Reported on 02/28/2022 12/07/21     ?aspirin EC 81 MG tablet Take 1 tablet (81 mg total) by mouth daily. Swallow whole. 11/23/21   [name], MD  ?benzonatate (TESSALON PERLES) 100 MG capsule Take 2 capsules (200 mg total) by mouth 3 (three) times daily as needed for cough up to 14 days 02/17/22   Scarlett Presto, MD  ?blood glucose meter kit and supplies Dispense based on patient and insurance preference. Use up to four times daily as directed. (FOR ICD-10 E10.9, E11.9). 01/29/21   Sanjuan Dame, MD  ?Blood Glucose Monitoring Suppl  (ACCU-CHEK GUIDE) w/Device KIT 1 each by Does not apply route 3 (three) times daily. 05/18/17   Alphonzo Grieve, MD  ?cetirizine (ZYRTEC) 5 MG tablet Take 1 tablet (5 mg total) by mouth daily. 07/30/21 11/09/22  [name], MD  ?Cholecalciferol (VITAMIN D3) 50 MCG (2000 UT) CAPS Take 4 capsules (8,000 Units total) by mouth daily. 02/24/21     ?Continuous Blood Gluc Receiver (DEXCOM G6 RECEIVER) DEVI Use to check blood sugar at least 6 times a day 02/25/21   [name], MD  ?Continuous Blood Gluc Sensor (DEXCOM G6 SENSOR) MISC Use to check blood sugar at least 6 times a day 07/20/21   [name], MD  ?Continuous Blood Gluc Transmit (DEXCOM G6 TRANSMITTER) MISC Use to check blood sugar at least 6 times a day 07/20/21   [name], MD  ?fluticasone Asencion Islam) 50 MCG/ACT nasal spray Place 2 sprays into both nostrils every night 07/06/21     ?gabapentin (NEURONTIN) 100 MG capsule Take 1 capsule (100 mg total) by mouth at bedtime. 10/27/21   [name], MD  ?glucose blood (ACCU-CHEK GUIDE) test strip Check blood sugar up to 3 times  day 02/14/21   Mosetta Anis, MD  ?guanFACINE (INTUNIV) 1 MG TB24 ER tablet Take 1 tablet (1 mg total) by mouth daily fo 14 days. 02/17/22   Demaio, Alexa, MD  ?HYDROcodone bit-homatropine (HYCODAN) 5-1.5 MG/5ML syrup Take 5 mLs  by mouth every 6 (six) hours as needed for cough. ?Patient not taking: Reported on 02/28/2022 11/11/21   Barb Merino, MD  ?ibuprofen (ADVIL) 800 MG tablet Take 1 tablet (800 mg total) by mouth every 6 (six) hours as needed for pain. 12/06/21     ?icosapent Ethyl (VASCEPA) 1 g capsule Take 2 capsules (2 g total) by mouth 2 (two) times daily. 07/27/21   Loel Dubonnet, NP  ?injection device for insulin (INPEN 100-PINK-LILLY-HUMALOG) DEVI Use to inject Humalog insulin for meals and correction insulin 03/17/21   Madalyn Rob, MD  ?insulin degludec (TRESIBA) 100 UNIT/ML FlexTouch Pen Inject 24 Units into the skin daily. 06/03/21   [name], MD  ?insulin  lispro (HUMALOG) 100 UNIT/ML cartridge Use with inpen to inject at meal time and for correction insulin up to 40 units daily 06/04/21   [name], MD  ?Insulin Pen Needle 32G X 4 MM MISC Use to inject insulin 4 times a day 02/14/21   Mosetta Anis, MD  ?Insulin Syringe-Needle U-100 31G X 15/64" 0.3 ML MISC Use to inject insulin daily 08/17/18   Aldine Contes, MD  ?lanthanum (FOSRENOL) 1000 MG chewable tablet Chew 1 tablet (1,000 mg total) by mouth 3 (three) times daily with meals. 08/11/21     ?metoprolol tartrate (LOPRESSOR) 25 MG tablet Take 1 tablet (25 mg total) by mouth 2 (two) times daily. Do not take the night before dialysis, and take 2 hours after dialysis. 04/06/21   [name], MD  ?multivitamin (RENA-VIT) TABS tablet Take 1 tablet by mouth daily. 04/30/18   [provider]  ?oxyCODONE-acetaminophen (PERCOCET) 10-325 MG tablet Take 1 tablet by mouth 5 (five) times daily as needed ?Patient not taking: Reported on 02/28/2022 10/24/21     ?oxyCODONE-acetaminophen (PERCOCET) 10-325 MG tablet Take 1 tablet by mouth 5 (five) times daily as needed 11/23/21     ?oxyCODONE-acetaminophen (PERCOCET) 10-325 MG tablet Take 1 tablet by mouth 5 (five) times daily as needed ?Patient not taking: Reported on 01/05/2022 12/24/21     ?oxyCODONE-acetaminophen (PERCOCET) 10-325 MG tablet Take 1 tablet by mouth 5 (five) times daily as needed 02/24/22     ?rosuvastatin (CRESTOR) 40 MG tablet Take 1 tablet (40 mg total) by mouth daily. 07/27/21   Loel Dubonnet, NP  ?Semaglutide,0.25 or 0.5MG/DOS, (OZEMPIC, 0.25 OR 0.5 MG/DOSE,) 2 MG/1.5ML SOPN Inject 0.5 mg into the skin once a week. 11/18/21   Delene Ruffini, MD  ?sertraline (ZOLOFT) 100 MG tablet Take 0.5 tablets (50 mg total) by mouth daily. 04/01/21   [name], MD  ?sevelamer carbonate (RENVELA) 800 MG tablet Take 3,200 mg by mouth 3 (three) times daily with meals. Take 800 mg with snacks as needed    [provider]  ?Sodium Fluoride 1.1 %  PSTE Brush teeth with small amount 2 times a day 10/05/21     ?   ? ?Allergies    ?Adhesive [tape]   ? ?Review of Systems   ?Review of Systems  ?Genitourinary:  Positive for vaginal bleeding.  ?All other systems reviewed and are negative. ? ?Physical Exam ?Updated Vital Signs ?BP (!) 177/85 (BP Location: Left Arm)   Pulse 97   Temp (!) 97.5 ?F (36.4 ?C) (Oral)   Resp 16   SpO2 100%  ?Physical Exam ?Vitals and nursing note reviewed.  ?Constitutional:   ?   Appearance: Normal appearance.  ?HENT:  ?   Head: Normocephalic.  ?   Nose: Nose normal.  ?  Mouth/Throat:  ?   Mouth: Mucous membranes are moist.  ?Eyes:  ?   Extraocular Movements: Extraocular movements intact.  ?   Pupils: Pupils are equal, round, and reactive to light.  ?Cardiovascular:  ?   Rate and Rhythm: Normal rate and regular rhythm.  ?   Pulses: Normal pulses.  ?   Heart sounds: Normal heart sounds.  ?Pulmonary:  ?   Effort: Pulmonary effort is normal.  ?   Breath sounds: Normal breath sounds.  ?Abdominal:  ?   General: Abdomen is flat.  ?   Palpations: Abdomen is soft.  ?Musculoskeletal:     ?   General: Normal range of motion.  ?   Cervical back: Normal range of motion and neck supple.  ?   Comments: Right fistula with good thrill  ?Skin: ?   General: Skin is warm.  ?   Capillary Refill: Capillary refill takes less than 2 seconds.  ?Neurological:  ?   General: No focal deficit present.  ?   Mental Status: She is alert and oriented to person, place, and time.  ?Psychiatric:     ?   Mood and Affect: Mood normal.     ?   Behavior: Behavior normal.  ? ? ?ED Results / Procedures / Treatments   ?Labs ?(all labs ordered are listed, but only abnormal results are displayed) ?Labs Reviewed  ?COMPREHENSIVE METABOLIC PANEL - Abnormal; Notable for the following components:  ?    Result Value  ? Glucose, Bld 197 (*)   ? BUN 40 (*)   ? Creatinine, Ser 13.42 (*)   ? Calcium 8.4 (*)   ? Albumin 3.2 (*)   ? AST 12 (*)   ? GFR, Estimated 3 (*)   ? All other  components within normal limits  ?CBC - Abnormal; Notable for the following components:  ? RBC 3.45 (*)   ? Hemoglobin 9.1 (*)   ? HCT 29.4 (*)   ? RDW 15.7 (*)   ? All other components within normal limits  ?LIPASE, BLOOD  ?U

## 2022-02-28 NOTE — Discharge Instructions (Addendum)
Please restart megace 40 mg twice daily  ? ?Please give Dr. Maylene Roes- Smith's office to call to schedule an appointment. ? ?Please go to dialysis as soon as you are able to ? ?Return to ER if you have worse vaginal bleeding, severe abdominal pain, fever ? ?

## 2022-02-28 NOTE — ED Triage Notes (Signed)
Pt to triage via [facility] from home.  Reports vaginal bleeding.  Pt had uterine ablation 2/7 and has had intermittent bleeding since then.  Bleeding worse since Friday with >10 pads per day.  Lower abd cramping.  Last dialysis on Friday.  Has Rx for Heparin but has not taken for several weeks due to bleeding.  Feels lightheaded when standing.   ?

## 2022-03-01 ENCOUNTER — Other Ambulatory Visit (HOSPITAL_COMMUNITY): Payer: Self-pay

## 2022-03-01 ENCOUNTER — Other Ambulatory Visit: Payer: Self-pay | Admitting: Obstetrics & Gynecology

## 2022-03-01 DIAGNOSIS — N939 Abnormal uterine and vaginal bleeding, unspecified: Secondary | ICD-10-CM

## 2022-03-01 NOTE — Telephone Encounter (Signed)
Pt went to ED yesterday

## 2022-03-02 ENCOUNTER — Other Ambulatory Visit: Payer: Self-pay | Admitting: Obstetrics & Gynecology

## 2022-03-02 ENCOUNTER — Other Ambulatory Visit (HOSPITAL_COMMUNITY): Payer: Self-pay

## 2022-03-02 DIAGNOSIS — N939 Abnormal uterine and vaginal bleeding, unspecified: Secondary | ICD-10-CM

## 2022-03-03 ENCOUNTER — Other Ambulatory Visit (HOSPITAL_COMMUNITY): Payer: Self-pay

## 2022-03-03 ENCOUNTER — Ambulatory Visit (INDEPENDENT_AMBULATORY_CARE_PROVIDER_SITE_OTHER): Payer: 59 | Admitting: Internal Medicine

## 2022-03-03 DIAGNOSIS — T7840XD Allergy, unspecified, subsequent encounter: Secondary | ICD-10-CM

## 2022-03-03 DIAGNOSIS — F321 Major depressive disorder, single episode, moderate: Secondary | ICD-10-CM | POA: Diagnosis not present

## 2022-03-03 DIAGNOSIS — E113493 Type 2 diabetes mellitus with severe nonproliferative diabetic retinopathy without macular edema, bilateral: Secondary | ICD-10-CM | POA: Diagnosis not present

## 2022-03-03 DIAGNOSIS — Z794 Long term (current) use of insulin: Secondary | ICD-10-CM | POA: Diagnosis not present

## 2022-03-03 DIAGNOSIS — E083413 Diabetes mellitus due to underlying condition with severe nonproliferative diabetic retinopathy with macular edema, bilateral: Secondary | ICD-10-CM

## 2022-03-03 DIAGNOSIS — E113413 Type 2 diabetes mellitus with severe nonproliferative diabetic retinopathy with macular edema, bilateral: Secondary | ICD-10-CM

## 2022-03-03 LAB — POCT GLYCOSYLATED HEMOGLOBIN (HGB A1C): Hemoglobin A1C: 9.7 % — AB (ref 4.0–5.6)

## 2022-03-03 LAB — GLUCOSE, CAPILLARY: Glucose-Capillary: 340 mg/dL — ABNORMAL HIGH (ref 70–99)

## 2022-03-03 MED ORDER — INSULIN DEGLUDEC 100 UNIT/ML ~~LOC~~ SOPN
24.0000 [IU] | PEN_INJECTOR | Freq: Every day | SUBCUTANEOUS | 1 refills | Status: DC
  Filled 2022-03-03: qty 15, 62d supply, fill #0
  Filled 2022-05-13: qty 15, 62d supply, fill #1

## 2022-03-03 MED ORDER — INSULIN PEN NEEDLE 32G X 4 MM MISC
Freq: Four times a day (QID) | 3 refills | Status: DC
  Filled 2022-03-03: qty 400, fill #0
  Filled 2022-03-09: qty 300, 75d supply, fill #0
  Filled 2022-05-13: qty 400, 100d supply, fill #0

## 2022-03-03 MED ORDER — SERTRALINE HCL 25 MG PO TABS
50.0000 mg | ORAL_TABLET | Freq: Every day | ORAL | 1 refills | Status: DC
  Filled 2022-03-03: qty 30, 15d supply, fill #0

## 2022-03-03 MED ORDER — DEXCOM G6 TRANSMITTER MISC
3 refills | Status: DC
  Filled 2022-03-03: qty 1, 90d supply, fill #0
  Filled 2022-05-13: qty 1, 90d supply, fill #1

## 2022-03-03 MED ORDER — OZEMPIC (0.25 OR 0.5 MG/DOSE) 2 MG/1.5ML ~~LOC~~ SOPN
0.2500 mg | PEN_INJECTOR | SUBCUTANEOUS | 0 refills | Status: DC
  Filled 2022-03-03: qty 1.5, 56d supply, fill #0

## 2022-03-03 MED ORDER — GABAPENTIN 100 MG PO CAPS
100.0000 mg | ORAL_CAPSULE | Freq: Every day | ORAL | 1 refills | Status: AC
  Filled 2022-03-03: qty 90, 90d supply, fill #0
  Filled 2022-05-13 – 2022-09-26 (×2): qty 90, 90d supply, fill #1

## 2022-03-03 MED ORDER — INPEN 100-PINK-LILLY-HUMALOG DEVI: 1 refills | Status: DC

## 2022-03-03 MED ORDER — CETIRIZINE HCL 5 MG PO TABS
5.0000 mg | ORAL_TABLET | Freq: Every day | ORAL | 1 refills | Status: DC
  Filled 2022-03-03 – 2022-05-13 (×2): qty 90, 90d supply, fill #0

## 2022-03-03 NOTE — Patient Instructions (Signed)
Thank you for trusting me with your care. To recap, today we discussed the following: ? ?Type 2 diabetes mellitus  ?- insulin degludec (TRESIBA) 100 UNIT/ML FlexTouch Pen; Inject 24 Units into the skin daily.  Dispense: 15 mL; Refill: 1 ?- injection device for insulin (INPEN 100-PINK-LILLY-HUMALOG) DEVI; Use to inject Humalog insulin for meals and correction insulin  Dispense: 1 each; Refill: 1 ?- Insulin Pen Needle 32G X 4 MM MISC; Use to inject insulin 4 times a day  Dispense: 400 each; Refill: 3 ?- Continuous Blood Gluc Transmit (DEXCOM G6 TRANSMITTER) MISC; Use to check blood sugar at least 6 times a day  Dispense: 1 each; Refill: 3 ?- Referral to Nutrition and Diabetes Services ?- gabapentin (NEURONTIN) 100 MG capsule; Take 1 capsule (100 mg total) by mouth at bedtime.  Dispense: 90 capsule; Refill: 1 ?- Semaglutide,0.25 or 0.'5MG'$ /DOS, (OZEMPIC, 0.25 OR 0.5 MG/DOSE,) 2 MG/1.5ML SOPN; Inject 0.25 mg into the skin once a week.  Dispense: 1.5 mL; Refill: 0 ? ? ?Depression ?- sertraline (ZOLOFT) 25 MG tablet; Take 2 tablets (50 mg total) by mouth daily.  Dispense: 30 tablet; Refill: 1 ? ?Allergies ?- cetirizine (ZYRTEC) 5 MG tablet; Take 1 tablet (5 mg total) by mouth daily.  Dispense: 90 tablet; Refill: 1 ? ? ?

## 2022-03-03 NOTE — Progress Notes (Addendum)
? ?CC: type 2 diabetes mellitus, major depressive disorder, and allergies ? ?HPI:[name] is a 46 y.o. female who presents for evaluation of type 2 diabetes mellitus, depression, and allergies. Patient recently seen in ED for vaginal bleeding. She has follow up with Gynecologist and bleeding has resolved on Megace.  Please see individual problem based A/P for details. ? ? ?Past Medical History:  ?Diagnosis Date  ? Abnormal uterine bleeding (AUB)   ? Arthritis of knee   ? Left, Gel and cortisone injections @ [facility]  ? Asthma   ? prn inhaler  ? CAD S/P BMS PCI to prox LAD cardiologist--- dr [name]  ? Ost LAD to Mid LAD lesion, 90% stenosed. Post intervention - Vision BMS 3.0 mm x 18 mm (~3.5 mm) there is a 0% residual stenosis. ;   nuclear stress test-- 09-13-2018 low risk with no ischemia, nuclear ef 56%  ? Charcot foot due to diabetes mellitus ([name]) 11/2016  ? left 2018; right 2019  ? Depression   ? Diabetic foot ulcer ([name]) 04/22/2019  ? Diabetic neuropathy ([name])   ? feet  ? Diabetic retinopathy, nonproliferative, severe ([name])   ? bilateral  ? ESRD (end stage renal disease) on dialysis [name])   ? ?chronic interstitial nephritis  ([name]  ? H/O non-ST elevation myocardial infarction (NSTEMI) 03/2016  ? Found in 90% mid LAD lesion treated with bare-metal stent (BMS) PCI - vision BMS 3.0 mm x 18 mm  ? History of MRSA infection 2007  ? Hyperlipidemia   ? Hypertension   ? IDA (iron deficiency anemia)   ? Insulin dependent type 2 diabetes mellitus ([name])   ? Iron deficiency anemia   ? takes iron supplement  ? 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 PAOD (peripheral arterial occlusive disease) ([name])   ? vascular--- dr [name]  ? S/P arteriovenous (AV) fistula creation 07/2017  ? S/p bare metal coronary artery stent 03/31/2016  ? BMS x1  to pLAD  ? Sickle cell trait ([name])   ? Ulcer of left foot due to type 2 diabetes mellitus ([name]) 06/03/2019  ? ?Review  of Systems:   ?Review of Systems  ?Constitutional:  Negative for chills and fever.  ?Neurological:  Negative for dizziness.   ? ?Physical Exam: ?Vitals:  ? 03/03/22 1341  ?BP: (!) 112/57  ?Pulse: 89  ?Temp: 99.1 ?F (37.3 ?C)  ?TempSrc: Oral  ?SpO2: 100%  ?Weight: 225 lb 12.8 oz (102.4 kg)  ?Height: '5\' 6"'$  (1.676 m)  ? ? ? ?Physical Exam ?Constitutional:   ?   Appearance: Normal appearance.  ?Cardiovascular:  ?   Rate and Rhythm: Normal rate and regular rhythm.  ?   Comments: +bruit in R.arm AVF ?Pulmonary:  ?   Effort: Pulmonary effort is normal.  ?   Breath sounds: Normal breath sounds.  ?Neurological:  ?   Mental Status: She is alert.  ?Psychiatric:     ?   Thought Content: Thought content normal.  ?   Comments: Depressed mood  ? ? ? ?Assessment & Plan:  ? 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 ?Hgb A1c 9.2 % at the last visit. Current A1c 9.7 %. Not currently taking any medications or checking blood glucose.  ? 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 Plan to restart previous regimen  patient was on. Referral to clinic diabetes educator. Follow up in one month for further adjustments to regimen. ? ?- insulin degludec (TRESIBA) 100 UNIT/ML FlexTouch Pen; Inject 24 Units into the skin daily.  Dispense: 15 mL; Refill: 1 ?- injection device for insulin (INPEN 100-PINK-LILLY-HUMALOG) DEVI; Use to inject Humalog insulin for meals and correction insulin  Dispense: 1 each; Refill: 1 ?- Insulin Pen Needle 32G X 4 MM MISC; Use to inject insulin 4 times a day  Dispense: 400 each; Refill: 3 ?- Continuous Blood Gluc Transmit (DEXCOM G6 TRANSMITTER) MISC; Use to check blood sugar at least 6 times a day  Dispense: 1 each; Refill: 3 ?- Referral to Nutrition and Diabetes Services ?- gabapentin (NEURONTIN) 100 MG capsule; Take 1  capsule (100 mg total) by mouth at bedtime.  Dispense: 90 capsule; Refill: 1 ?- Semaglutide,0.25 or 0.'5MG'$ /DOS, (OZEMPIC, 0.25 OR 0.5 MG/DOSE,) 2 MG/1.5ML SOPN; Inject 0.25 mg into the skin once a week.  Dispense: 1.5 mL; Refill: 0 ? ? ? ? ?Major depressive disorder ?Patient has been off of her sertraline. She continues to meet with her therapist. She is feeling down and easily agitated. She has felt tired and had problems sleeping. No thoughts of hurting herself.  ? ?Assessment/Plan: Major depressive disorder ?- Restart Sertraline 25 mg, follow up in 4 weeks . Consider increasing if needed at this visit, previously on 50 mg before stopping.  ? ?Allergies ?Refill ?- cetirizine (ZYRTEC) 5 MG tablet; Take 1 tablet (5 mg total) by mouth daily.  Dispense: 90 tablet; Refill: 1 ? ? ? ?Patient discussed with Dr. Philipp Ovens ? ?

## 2022-03-04 ENCOUNTER — Encounter: Payer: Self-pay | Admitting: Internal Medicine

## 2022-03-04 ENCOUNTER — Other Ambulatory Visit (HOSPITAL_COMMUNITY): Payer: Self-pay

## 2022-03-04 MED ORDER — SERTRALINE HCL 25 MG PO TABS
25.0000 mg | ORAL_TABLET | Freq: Every day | ORAL | 1 refills | Status: DC
  Filled 2022-03-04 – 2022-05-13 (×2): qty 30, 30d supply, fill #0

## 2022-03-04 NOTE — Assessment & Plan Note (Signed)
?  Hgb A1c 9.2 % at the last visit. Current A1c 9.7 %. Not currently taking any medications or checking blood glucose.  ? 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 Plan to restart previous regimen patient was on. Referral to clinic diabetes educator. Follow up in one month for further adjustments to regimen. ? ?- insulin degludec (TRESIBA) 100 UNIT/ML FlexTouch Pen; Inject 24 Units into the skin daily.  Dispense: 15 mL; Refill: 1 ?- injection device for insulin (INPEN 100-PINK-LILLY-HUMALOG) DEVI; Use to inject Humalog insulin for meals and correction insulin  Dispense: 1 each; Refill: 1 ?- Insulin Pen Needle 32G X 4 MM MISC; Use to inject insulin 4 times a day  Dispense: 400 each; Refill: 3 ?- Continuous Blood Gluc Transmit (DEXCOM G6 TRANSMITTER) MISC; Use to check blood sugar at least 6 times a day  Dispense: 1 each; Refill: 3 ?- Referral to Nutrition and Diabetes Services ?- gabapentin (NEURONTIN) 100 MG capsule; Take 1 capsule (100 mg total) by mouth at bedtime.  Dispense: 90 capsule; Refill: 1 ?- Semaglutide,0.25 or 0.'5MG'$ /DOS, (OZEMPIC, 0.25 OR 0.5 MG/DOSE,) 2 MG/1.5ML SOPN; Inject 0.25 mg into the skin once a week.  Dispense: 1.5 mL; Refill: 0 ? ? ? ?

## 2022-03-04 NOTE — Assessment & Plan Note (Addendum)
Patient has been off of her sertraline. She continues to meet with her therapist. She is feeling down and easily agitated. She has felt tired and had problems sleeping. No thoughts of hurting herself.  ? ?Assessment/Plan: Major depressive disorder ?- Restart Sertraline 25 mg, follow up in 4 weeks . Consider increasing if needed at this visit, previously on 50 mg before stopping.  ?

## 2022-03-04 NOTE — Assessment & Plan Note (Signed)
Refill ?- cetirizine (ZYRTEC) 5 MG tablet; Take 1 tablet (5 mg total) by mouth daily.  Dispense: 90 tablet; Refill: 1 ? ?

## 2022-03-07 ENCOUNTER — Other Ambulatory Visit (HOSPITAL_COMMUNITY): Payer: Self-pay

## 2022-03-09 ENCOUNTER — Other Ambulatory Visit (HOSPITAL_COMMUNITY): Payer: Self-pay

## 2022-03-09 ENCOUNTER — Other Ambulatory Visit: Payer: Self-pay | Admitting: Internal Medicine

## 2022-03-09 DIAGNOSIS — Z794 Long term (current) use of insulin: Secondary | ICD-10-CM

## 2022-03-11 ENCOUNTER — Other Ambulatory Visit (HOSPITAL_COMMUNITY): Payer: Self-pay

## 2022-03-11 MED ORDER — DEXCOM G6 RECEIVER DEVI
Freq: Every day | 0 refills | Status: DC
Start: 2022-03-11 — End: 2023-03-13
  Filled 2022-03-11: qty 1, 90d supply, fill #0
  Filled 2022-05-13: qty 1, 1d supply, fill #0

## 2022-03-11 MED ORDER — DEXCOM G6 SENSOR MISC
Freq: Every day | 3 refills | Status: DC
  Filled 2022-03-11: qty 9, 84d supply, fill #0
  Filled 2022-05-13: qty 9, 90d supply, fill #0

## 2022-03-14 NOTE — Progress Notes (Signed)
Internal Medicine Clinic Attending  Case discussed with Dr. Steen  At the time of the visit.  We reviewed the resident's history and exam and pertinent patient test results.  I agree with the assessment, diagnosis, and plan of care documented in the resident's note.  

## 2022-03-21 ENCOUNTER — Other Ambulatory Visit (HOSPITAL_COMMUNITY): Payer: Self-pay

## 2022-03-25 ENCOUNTER — Other Ambulatory Visit (HOSPITAL_COMMUNITY): Payer: Self-pay

## 2022-03-25 MED ORDER — OXYCODONE-ACETAMINOPHEN 10-325 MG PO TABS
1.0000 | ORAL_TABLET | ORAL | 0 refills | Status: DC
  Filled 2022-03-25: qty 150, 30d supply, fill #0

## 2022-03-30 ENCOUNTER — Encounter: Payer: 59 | Admitting: Internal Medicine

## 2022-03-30 ENCOUNTER — Encounter: Payer: 59 | Admitting: Dietician

## 2022-03-31 ENCOUNTER — Encounter: Payer: 59 | Admitting: Internal Medicine

## 2022-03-31 ENCOUNTER — Encounter: Payer: 59 | Admitting: Dietician

## 2022-04-06 ENCOUNTER — Encounter: Payer: Self-pay | Admitting: Obstetrics & Gynecology

## 2022-04-06 ENCOUNTER — Ambulatory Visit (INDEPENDENT_AMBULATORY_CARE_PROVIDER_SITE_OTHER): Payer: 59 | Admitting: Obstetrics & Gynecology

## 2022-04-06 ENCOUNTER — Other Ambulatory Visit (HOSPITAL_COMMUNITY): Payer: Self-pay

## 2022-04-06 VITALS — BP 139/66 | HR 101 | Wt 221.0 lb

## 2022-04-06 DIAGNOSIS — N939 Abnormal uterine and vaginal bleeding, unspecified: Secondary | ICD-10-CM

## 2022-04-06 MED ORDER — MEGESTROL ACETATE 40 MG PO TABS
40.0000 mg | ORAL_TABLET | Freq: Two times a day (BID) | ORAL | 6 refills | Status: DC
  Filled 2022-04-06 – 2022-04-22 (×2): qty 60, 30d supply, fill #0
  Filled 2022-05-13 – 2022-06-08 (×3): qty 60, 30d supply, fill #1
  Filled 2022-07-28: qty 60, 30d supply, fill #2
  Filled 2022-09-26: qty 60, 30d supply, fill #3
  Filled 2022-11-16: qty 60, 30d supply, fill #4

## 2022-04-06 NOTE — Progress Notes (Signed)
History:  46 y.o. [id number] here today for f/u of AUB. Pt notes that her bleeding is worse post dialysis when she gets heparin. Her sx are resolved on the Megace '80mg'$  daily. She is not interested in getting her left knee evaluated. She is awaiting the MRI and thinks that she will need surgery.    She is s/p endo ablation 12/21/2021  The following portions of the patient's history were reviewed and updated as appropriate: allergies, current medications, past family history, past medical history, past social history, past surgical history and problem list.  Review of Systems:  Pertinent items are noted in HPI.    Objective:  Physical Exam Blood pressure 139/66, pulse (!) 101, weight 221 lb (100.2 kg).  CONSTITUTIONAL: Well-developed, well-nourished female in no acute distress.  HENT:  Normocephalic, atraumatic EYES: Conjunctivae and EOM are normal. No scleral icterus.  NECK: Normal range of motion SKIN: Skin is warm and dry. No rash noted. Not diaphoretic.No pallor. [place]: Alert and oriented to person, place, and time. Normal coordination.  Pelvic: not done   Assessment & Plan:  AUB improved with endo ablation and Megace  Keep Megace 80 mg daily.   F/u in 6 months or sooner prn   Elisah Parmer L. Harraway-Smith, M.D., [name]

## 2022-04-14 ENCOUNTER — Other Ambulatory Visit (HOSPITAL_COMMUNITY): Payer: Self-pay

## 2022-04-19 ENCOUNTER — Other Ambulatory Visit (HOSPITAL_COMMUNITY): Payer: Self-pay

## 2022-04-22 ENCOUNTER — Other Ambulatory Visit (HOSPITAL_COMMUNITY): Payer: Self-pay

## 2022-04-22 MED ORDER — OXYCODONE-ACETAMINOPHEN 10-325 MG PO TABS
1.0000 | ORAL_TABLET | ORAL | 0 refills | Status: DC
  Filled 2022-04-22 – 2022-04-25 (×2): qty 150, 30d supply, fill #0

## 2022-04-25 ENCOUNTER — Other Ambulatory Visit (HOSPITAL_COMMUNITY): Payer: Self-pay

## 2022-04-28 ENCOUNTER — Encounter: Payer: 59 | Admitting: Dietician

## 2022-04-28 ENCOUNTER — Encounter: Payer: 59 | Admitting: Student

## 2022-05-02 ENCOUNTER — Encounter: Payer: 59 | Admitting: Dietician

## 2022-05-02 ENCOUNTER — Ambulatory Visit (INDEPENDENT_AMBULATORY_CARE_PROVIDER_SITE_OTHER): Payer: 59 | Admitting: Internal Medicine

## 2022-05-02 ENCOUNTER — Other Ambulatory Visit (HOSPITAL_COMMUNITY): Payer: Self-pay

## 2022-05-02 ENCOUNTER — Encounter (HOSPITAL_COMMUNITY): Payer: Self-pay

## 2022-05-02 VITALS — BP 152/62 | HR 92 | Ht 66.0 in | Wt 223.2 lb

## 2022-05-02 DIAGNOSIS — M1712 Unilateral primary osteoarthritis, left knee: Secondary | ICD-10-CM

## 2022-05-02 DIAGNOSIS — Z794 Long term (current) use of insulin: Secondary | ICD-10-CM

## 2022-05-02 DIAGNOSIS — J069 Acute upper respiratory infection, unspecified: Secondary | ICD-10-CM | POA: Diagnosis not present

## 2022-05-02 DIAGNOSIS — E113413 Type 2 diabetes mellitus with severe nonproliferative diabetic retinopathy with macular edema, bilateral: Secondary | ICD-10-CM

## 2022-05-02 DIAGNOSIS — I10 Essential (primary) hypertension: Secondary | ICD-10-CM

## 2022-05-02 DIAGNOSIS — R0989 Other specified symptoms and signs involving the circulatory and respiratory systems: Secondary | ICD-10-CM

## 2022-05-02 MED ORDER — BENZONATATE 100 MG PO CAPS
200.0000 mg | ORAL_CAPSULE | Freq: Three times a day (TID) | ORAL | 0 refills | Status: DC | PRN
  Filled 2022-05-02: qty 20, 4d supply, fill #0

## 2022-05-02 MED ORDER — GUANFACINE HCL ER 1 MG PO TB24
1.0000 mg | ORAL_TABLET | Freq: Every day | ORAL | 0 refills | Status: DC
  Filled 2022-05-02: qty 30, 30d supply, fill #0

## 2022-05-02 NOTE — Progress Notes (Signed)
   CC: DM check and cough  HPI:  [name] is a 46 y.o. with past medical history as noted below who presents to the clinic today for a DM check and a cough. Please see problem-based list for further details, assessments, and plans.   Past Medical History:  Diagnosis Date   Abnormal uterine bleeding (AUB)    Arthritis of knee    Left, Gel and cortisone injections @ [facility]   Asthma    prn inhaler   CAD S/P BMS PCI to prox LAD cardiologist--- dr Beverlyn Roux LAD to Mid LAD lesion, 90% stenosed. Post intervention - Vision BMS 3.0 mm x 18 mm (~3.5 mm) there is a 0% residual stenosis. ;   nuclear stress test-- 09-13-2018 low risk with no ischemia, nuclear ef 56%   Charcot foot due to diabetes mellitus ([place]) 11/2016   left 2018; right 2019   Depression    Diabetic foot ulcer ([place]) 04/22/2019   Diabetic neuropathy ([facility])    feet   Diabetic retinopathy, nonproliferative, severe ([place])    bilateral   ESRD (end stage renal disease) on dialysis [facility])    ?chronic interstitial nephritis  ([facility]   H/O non-ST elevation myocardial infarction (NSTEMI) 03/2016   Found in 90% mid LAD lesion treated with bare-metal stent (BMS) PCI - vision BMS 3.0 mm x 18 mm   History of MRSA infection 2007   Hyperlipidemia    Hypertension    IDA (iron deficiency anemia)    Insulin dependent type 2 diabetes mellitus ([facility])    Iron deficiency anemia    takes iron supplement   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   Physical Exam:  Vitals:   05/02/22 1451  05/02/22 1524  BP: (!) 158/69 (!) 152/62  Pulse: 98 92  SpO2: 100%   Weight: 223 lb 3.2 oz (101.2 kg)   Height: '5\' 6"'$  (1.676 m)    General: NAD, nl appearance HE: Normocephalic, atraumatic, EOMI, Conjunctivae normal ENT: +congestion and rhinorrhea, no exudate or erythema  Cardiovascular: Normal rate, regular rhythm. No murmurs, rubs, or gallops Pulmonary: Effort normal, breath sounds normal. No wheezes, rales, or rhonchi Abdominal: soft, nontender, bowel sounds present Musculoskeletal: no swelling, deformity, injury or tenderness in extremities Skin: Warm, dry, no bruising, erythema, or rash Psychiatric/Behavioral: normal mood, normal behavior     Assessment & Plan:   See Encounters Tab for problem based charting.  Patient discussed with Dr. Evette Doffing

## 2022-05-02 NOTE — Patient Instructions (Signed)
Thank you, [name] for allowing [place] to provide your care today. Today we discussed your diabetes and cough.  Continue your current diabetes medicines. You have had a visit with [name] Plyler so that you can start using your G6.  Cough: I have tested you for COVID and flu. I will contact you with these results.     I have ordered the following labs for you:  Lab Orders         COVID-19, Flu A+B and RSV         Sed Rate (ESR)         CRP (C-Reactive Protein)       Referrals ordered today:   Referral Orders  No referral(s) requested today     I have ordered the following medication/changed the following medications:    Start the following medications: Meds ordered this encounter  Medications   benzonatate (TESSALON PERLES) 100 MG capsule    Sig: Take 2 capsules (200 mg total) by mouth 3 (three) times daily as needed for cough up to 14 days    Dispense:  20 capsule    Refill:  0   guanFACINE (INTUNIV) 1 MG TB24 ER tablet    Sig: Take 1 tablet (1 mg total) by mouth daily fo 14 days.    Dispense:  30 tablet    Refill:  0     Follow up:  1.5 months     Should you have any questions or concerns please call the internal medicine clinic at [phone].

## 2022-05-02 NOTE — Assessment & Plan Note (Addendum)
BP Readings from Last 3 Encounters:  05/02/22 (!) 152/62  04/06/22 139/66  03/03/22 (!) 112/57   Patient is on metoprolol 25 mg bid. States that she is not currently on amlodipine.  Plan: Pt is a dialysis pt, will need to obtain records from [facility] in regards to her BP regimen. Otherwise continue current regimen.

## 2022-05-02 NOTE — Assessment & Plan Note (Signed)
Pt seen with Debera Lat today. Pt states she has been compliant with her DM medicines at home but has been unable to check her sugars because she has had difficulty obtaining her G6 through her mail-in pharmacy. Not due for A1c today.  Plan: [name] Plyler assisted the patient with getting setup with her G6. Will continue current regimen and follow-up in 1.5 months. Will make adjustments based on these values.

## 2022-05-02 NOTE — Assessment & Plan Note (Addendum)
Pt describes coming down with a cold a few days ago. Endorses congestion, sore throat, a cough productive of green sputum. Endorses some shortness of breath overnight. States that her grandson at home may have given this to her. She states that she had the same symptoms last April which resolved with tessalon perles and guanfacine.  Exam: Pt is breathing normally on RA and satting 100%. Lung exam is unremarkable for any adventitious lung sounds.  ENT: No exudate or cervical lymphadenopathy  Plan: Will test for COVID and flu today. Otherwise, prescribed tessalon perles and guanfacine. Discussed return precautions.

## 2022-05-03 ENCOUNTER — Encounter: Payer: Self-pay | Admitting: *Deleted

## 2022-05-03 LAB — C-REACTIVE PROTEIN: CRP: 26 mg/L — ABNORMAL HIGH (ref 0–10)

## 2022-05-03 LAB — SEDIMENTATION RATE: Sed Rate: 70 mm/hr — ABNORMAL HIGH (ref 0–32)

## 2022-05-03 NOTE — Progress Notes (Signed)
05-03-2022 at [place] final lab report for [name], SED Rate and C-Reactive Protein, to Dr Paralee Cancel, [facility] [phone].  Maryan Rued, [place] [facility] 05-03-2022 [phone]

## 2022-05-04 ENCOUNTER — Other Ambulatory Visit (HOSPITAL_COMMUNITY): Payer: Self-pay

## 2022-05-04 LAB — COVID-19, FLU A+B AND RSV
Influenza A, NAA: NOT DETECTED
Influenza B, NAA: NOT DETECTED
RSV, NAA: NOT DETECTED
SARS-CoV-2, NAA: NOT DETECTED

## 2022-05-11 NOTE — Progress Notes (Signed)
Internal Medicine Clinic Attending  Case discussed with Dr. Lorin Glass  at the time of the visit.  We reviewed the resident's history and exam and pertinent patient test results.  I agree with the assessment, diagnosis, and plan of care documented in the resident's note.

## 2022-05-13 ENCOUNTER — Other Ambulatory Visit: Payer: Self-pay

## 2022-05-13 ENCOUNTER — Other Ambulatory Visit: Payer: Self-pay | Admitting: Cardiology

## 2022-05-13 ENCOUNTER — Other Ambulatory Visit (HOSPITAL_COMMUNITY): Payer: Self-pay

## 2022-05-13 MED ORDER — METOPROLOL TARTRATE 25 MG PO TABS
25.0000 mg | ORAL_TABLET | Freq: Two times a day (BID) | ORAL | 1 refills | Status: DC
  Filled 2022-05-13: qty 180, 90d supply, fill #0
  Filled 2022-06-10 – 2022-06-24 (×2): qty 180, 90d supply, fill #1

## 2022-05-13 MED ORDER — RENA-VITE PO TABS
1.0000 | ORAL_TABLET | Freq: Every day | ORAL | 3 refills | Status: AC
  Filled 2022-05-13: qty 90, 90d supply, fill #0

## 2022-05-16 ENCOUNTER — Other Ambulatory Visit (HOSPITAL_COMMUNITY): Payer: Self-pay

## 2022-05-18 ENCOUNTER — Ambulatory Visit (INDEPENDENT_AMBULATORY_CARE_PROVIDER_SITE_OTHER): Payer: 59 | Admitting: Family

## 2022-05-18 ENCOUNTER — Encounter (HOSPITAL_BASED_OUTPATIENT_CLINIC_OR_DEPARTMENT_OTHER): Payer: Self-pay | Admitting: Family

## 2022-05-18 VITALS — BP 110/62 | HR 96 | Ht 66.0 in | Wt 220.7 lb

## 2022-05-18 DIAGNOSIS — I5032 Chronic diastolic (congestive) heart failure: Secondary | ICD-10-CM | POA: Diagnosis not present

## 2022-05-18 DIAGNOSIS — E785 Hyperlipidemia, unspecified: Secondary | ICD-10-CM | POA: Diagnosis not present

## 2022-05-18 DIAGNOSIS — I25118 Atherosclerotic heart disease of native coronary artery with other forms of angina pectoris: Secondary | ICD-10-CM | POA: Diagnosis not present

## 2022-05-18 DIAGNOSIS — I10 Essential (primary) hypertension: Secondary | ICD-10-CM | POA: Diagnosis not present

## 2022-05-18 DIAGNOSIS — N186 End stage renal disease: Secondary | ICD-10-CM

## 2022-05-18 NOTE — Progress Notes (Signed)
Office Visit    Patient Name: Jane [name] Date of Encounter: 05/18/2022  PCP:  Lyndle Herrlich, MD   [facility]  Cardiologist:  Bryan Lemma, MD  Advanced Practice Provider:  No care team member to display Electrophysiologist:  None     Chief Complaint    Jane [name] is a 46 y.o. female with a hx of palpitations, sinus tachycardia, CAD s/p BMS PCi to prox LAD in setting of NSTEMI in 2017, chronic diastolic heart failure, Dm2, obesity, HTN, HLD, ESRD on dialysis presents today for follow up of CAD   Past Medical History    Past Medical History:  Diagnosis Date   Abnormal uterine bleeding (AUB)    Arthritis of knee    Left, Gel and cortisone injections @ [facility]   Asthma    prn inhaler   CAD S/P BMS PCI to prox LAD cardiologist--- dr Judithann Sauger LAD to Mid LAD lesion, 90% stenosed. Post intervention - Vision BMS 3.0 mm x 18 mm (~3.5 mm) there is a 0% residual stenosis. 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   left 2018; right 2019   Depression    Diabetic foot ulcer (HCC) 04/22/2019   Diabetic neuropathy (HCC)    feet   Diabetic retinopathy, nonproliferative, severe (HCC)    bilateral   ESRD (end stage renal disease) on dialysis [facility])    ?chronic interstitial nephritis  ([facility]   H/O non-ST elevation myocardial infarction (NSTEMI) 03/2016   Found in 90% mid LAD lesion treated with bare-metal stent (BMS) PCI - vision BMS 3.0 mm x 18 mm   History of MRSA infection 2007   Hyperlipidemia    Hypertension    IDA (iron deficiency anemia)    Insulin dependent type 2 diabetes mellitus (HCC)    Iron deficiency anemia    takes iron supplement   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   PAOD (peripheral arterial occlusive disease) (HCC)    vascular--- dr Imogene Burn   S/P arteriovenous  (AV) fistula creation 07/2017   S/p bare metal coronary artery stent 03/31/2016   BMS x1  to pLAD   Sickle cell trait (HCC)    Ulcer of left foot due to type 2 diabetes mellitus (HCC) 06/03/2019   Past Surgical History:  Procedure Laterality Date   A/V FISTULAGRAM N/A 04/13/2021   Procedure: A/V FISTULAGRAM - Right Arm;  Surgeon: Nada Libman, MD;  Location: [facility] IN[facility]ASIVE CV LAB;  Service: Cardiovascular;  Laterality: N/A;   ACHILLES TENDON LENGTHENING Left 11/24/2016   Procedure: Left Achilles tendon lengthening (open);  Surgeon: Toni Arthurs, MD;  Location: [facility];  Service: Orthopedics;  Laterality: Left;   AV FISTULA PLACEMENT Right 07/31/2017   Procedure: ARTERIOVENOUS BRACHIOCEPHALIC FISTULA CREATION RIGHT ARM;  Surgeon: Fransisco Hertz, MD;  Location: [facility] OR;  Service: Vascular;  Laterality: Right;   CALCANEAL OSTEOTOMY Left 11/24/2016   Procedure: Left hindfoot osteotomy and fusion;  Surgeon: Toni Arthurs, MD;  Location: [facility];  Service: Orthopedics;  Laterality: Left;   CARDIAC CATHETERIZATION N/A 03/31/2016   Procedure: Left Heart Cath and Coronary Angiography;  Surgeon: Runell Gess, MD;  Location: [facility] INVASIVE CV LAB: 90% early mLAD, normal LV Fxn   CARDIAC CATHETERIZATION N/A 03/31/2016   Procedure: Coronary Stent Intervention;  Surgeon: Christiane Ha  Adora Fridge, MD;  Location: [place] CV LAB;  Service: Cardiovascular: PCI to mLAD BMS Vision 3.0 mm x 18 mm   CESAREAN [place] N/A 09/15/2020   Procedure: DILATATION & CURETTAGE/HYSTEROSCOPY;  Surgeon: Lavonia Drafts, MD;  Location: [place];  Service: Gynecology;  Laterality: N/A;   DILITATION & CURRETTAGE/HYSTROSCOPY WITH NOVASURE ABLATION N/A 12/21/2021   Procedure: [place] ABLATION/ IUD REMOVAL;  Surgeon: Lavonia Drafts, MD;  Location: [place];  Service: Gynecology;  Laterality: N/A;   FISTULA SUPERFICIALIZATION Right 11/15/2017   Procedure: FISTULA SUPERFICIALIZATION RIGHT BRACHIOCEPHALIC  RIGHT;  Surgeon: Conrad [place], MD;  Location: [place];  Service: Vascular;  Laterality: Right;   FOOT SURGERY     multiple for charcot   PERIPHERAL VASCULAR BALLOON ANGIOPLASTY Right 04/13/2021   Procedure: PERIPHERAL VASCULAR BALLOON ANGIOPLASTY;  Surgeon: Serafina Mitchell, MD;  Location: [place] CV LAB;  Service: Cardiovascular;  Laterality: Right;  arm fistula   TRANSTHORACIC ECHOCARDIOGRAM  02/2021   EF 55 to 60%.  No R WMA.  Mild septal LVH with moderate posterior hypertrophy.  Normal RV size and function.  Normal RVP/PASP.Marland Kitchen  Trivial MR.   TUBAL LIGATION  1997    Allergies  Allergies  Allergen Reactions   Adhesive [Tape] Other (See Comments)    Irritation     History of Present Illness    Jane [name] is a 46 y.o. female with a hx of palpitations, sinus tachycardia, CAD s/p BMS PCi to prox LAD in setting of NSTEMI in 8099, chronic diastolic heart failure, Dm2, obesity, HTN, HLD, ESRD on dialysis last seen 11/23/21 by Dr. [name].   Previous NSTEMI 2017 with BMS to LAD. She had myoview 08/2018 which was low risk with no evidence of ischemia or infarction and normal LVEF.   She was seen 01/06/21 by Dr. [name]. She had COVID earlier that month with some missed dialysis sessions and was working to get back to a euvolemic status. Due to dyspnea on exertion, echocardiogram was ordered. She had echocardiogram 02/18/21 with normal LVEF 55-60%, no RWMA, mild LVH (mild septal hypertrophy with moderate posterior hypertrophy), RV normal size and function, normal PASP, trivial MR. Seen 07/2021 due to palpitations with monitor showing NSR with rare PVC/PAC. Last seen by Dr. [name] 11/23/21 doing overall well from cardiac perspective and no changes made.   Presents today for follow up. Since last seen 12/2021 had hysteroscopy with  bilateral salpingectomy. Bleeding improved post procedure. Reports no shortness of breath at rest and stable mild dyspnea on exertion. Reports no chest pain, pressure, or tightness. No edema, orthopnea, PND. Reports no palpitations.  Some recent stress due to having to find new apartment. Has some concerns about dialysis technician and encouraged to continue to discuss with nephrology and dialysis team. Elevated BP at start of dialysis as high as 190s but improve to 110s with treatment. Not checking at home. Does note lots of sweating but only when outdoors. Still pending potential knee surgery.   EKGs/Labs/Other Studies Reviewed:   The following studies were reviewed today:  Echo 02/18/21  1. Mild septal hypertrophy with moderate posterior hypertrophy. Left  ventricular ejection fraction, by estimation, is 55 to 60%. The left  ventricle has normal function. The left ventricle has no regional wall  motion abnormalities. There is mild left  ventricular hypertrophy. Left ventricular diastolic parameters are  indeterminate. The average left ventricular global longitudinal  strain is  -21.0 %. The global longitudinal strain is normal.   2. Right ventricular systolic function is normal. The right ventricular  size is normal. There is normal pulmonary artery systolic pressure.   3. The mitral valve is normal in structure. Trivial mitral valve  regurgitation. No evidence of mitral stenosis.   4. The aortic valve is tricuspid. Aortic valve regurgitation is not  visualized. No aortic stenosis is present.   5. The inferior vena cava is normal in size with greater than 50%  respiratory variability, suggesting right atrial pressure of 3 mmHg.  ____________________________________________  Cardiac catheterization with PCI May 2017:90% mLAD; nl LV Fxn --> PCI - Vision BMS 3.52mm x 18 mm      EKG:  EKG is ordered today.  The ekg ordered today demonstrates NST 96 bpm with no acute ST/T wave changes.    Recent Labs: 05/26/2021: B Natriuretic Peptide 167.6; Magnesium 2.4 02/28/2022: ALT 10; BUN 40; Creatinine, Ser 13.42; Hemoglobin 9.1; Platelets 253; Potassium 4.2; Sodium 136  Recent Lipid Panel    Component Value Date/Time   CHOL 130 01/27/2021 0807   TRIG 107 01/27/2021 0807   HDL 44 01/27/2021 0807   CHOLHDL 3.0 01/27/2021 0807   CHOLHDL 3.0 03/03/2017 0850   VLDL 19 03/03/2017 0850   LDLCALC 66 01/27/2021 0807     Home Medications   Current Meds  Medication Sig   Accu-Chek Softclix Lancets lancets Check blood sugar up to 3 times  day   acetaminophen (TYLENOL) 500 MG tablet Take 1,000 mg by mouth every 6 (six) hours as needed (for pain.).   albuterol (PROVENTIL HFA) 108 (90 Base) MCG/ACT inhaler Inhale 2 puffs into the lungs every 4 (four) hours as needed for coughing, wheezing, or shortness of breath.   aspirin EC 81 MG tablet Take 1 tablet (81 mg total) by mouth daily. Swallow whole.   benzonatate (TESSALON PERLES) 100 MG capsule Take 2 capsules (200 mg total) by mouth 3 (three) times daily as needed for cough up to 14 days   blood glucose meter kit and supplies Dispense based on patient and insurance preference. Use up to four times daily as directed. (FOR ICD-10 E10.9, E11.9).   Blood Glucose Monitoring Suppl (ACCU-CHEK GUIDE) w/Device KIT 1 each by Does not apply route 3 (three) times daily.   cetirizine (ZYRTEC) 5 MG tablet Take 1 tablet (5 mg total) by mouth daily.   Cholecalciferol (VITAMIN D3) 50 MCG (2000 UT) CAPS Take 4 capsules (8,000 Units total) by mouth daily.   Continuous Blood Gluc Receiver (DEXCOM G6 RECEIVER) DEVI Use to check blood sugar at least 6 (six) times daily.   Continuous Blood Gluc Sensor (DEXCOM G6 SENSOR) MISC Use to check blood sugar at least 6 (six) times daily.   Continuous Blood Gluc Transmit (DEXCOM G6 TRANSMITTER) MISC Use to check blood sugar at least 6 times a day   fluticasone (FLONASE) 50 MCG/ACT nasal spray Place 2 sprays into both  nostrils every night   gabapentin (NEURONTIN) 100 MG capsule Take 1 capsule (100 mg total) by mouth at bedtime.   glucose blood (ACCU-CHEK GUIDE) test strip Check blood sugar up to 3 times  day   guanFACINE (INTUNIV) 1 MG TB24 ER tablet Take 1 tablet (1 mg total) by mouth daily.   ibuprofen (ADVIL) 800 MG tablet Take 1 tablet (800 mg total) by mouth every 6 (six) hours as needed for pain.   icosapent Ethyl (VASCEPA) 1 g capsule Take 2 capsules (2  g total) by mouth 2 (two) times daily.   injection device for insulin (INPEN 100-PINK-LILLY-HUMALOG) DEVI Use to inject Humalog insulin for meals and correction insulin   insulin degludec (TRESIBA) 100 UNIT/ML FlexTouch Pen Inject 24 Units into the skin daily.   insulin lispro (HUMALOG) 100 UNIT/ML cartridge Use with inpen to inject at meal time and for correction insulin up to 40 units daily   Insulin Pen Needle 32G X 4 MM MISC Use to inject insulin 4 (four) times daily.   Insulin Syringe-Needle U-100 31G X 15/64" 0.3 ML MISC Use to inject insulin daily   lanthanum (FOSRENOL) 1000 MG chewable tablet Chew 1 tablet (1,000 mg total) by mouth 3 (three) times daily with meals.   megestrol (MEGACE) 40 MG tablet Take 1 tablet (40 mg total) by mouth 2 (two) times daily.   metoprolol tartrate (LOPRESSOR) 25 MG tablet Take 1 tablet (25 mg total) by mouth 2 (two) times daily. Do not take the night before dialysis, and take 2 hours after dialysis.   multivitamin (RENA-VIT) TABS tablet Take 1 tablet by mouth daily.   oxyCODONE-acetaminophen (PERCOCET) 10-325 MG tablet Take 1 tablet by mouth 5 times a day as needed   rosuvastatin (CRESTOR) 40 MG tablet Take 1 tablet (40 mg total) by mouth daily.   Semaglutide,0.25 or 0.5MG /DOS, (OZEMPIC, 0.25 OR 0.5 MG/DOSE,) 2 MG/1.5ML SOPN Inject 0.25 mg into the skin once a week.   sertraline (ZOLOFT) 25 MG tablet Take 1 tablet (25 mg total) by mouth daily.   sevelamer carbonate (RENVELA) 800 MG tablet Take 3,200 mg by mouth 3  (three) times daily with meals. Take 800 mg with snacks as needed   Sodium Fluoride 1.1 % PSTE Brush teeth with small amount 2 times a day     Review of Systems     All other systems reviewed and are otherwise negative except as noted above.  Physical Exam    VS:  BP 110/62 (BP Location: Left Arm, Patient Position: Sitting, Cuff Size: Large)   Pulse 96   Ht 5\' 6"  (1.676 m)   Wt 220 lb 11.2 oz (100.1 kg)   BMI 35.62 kg/m  , BMI Body mass index is 35.62 kg/m.  Wt Readings from Last 3 Encounters:  05/18/22 220 lb 11.2 oz (100.1 kg)  05/02/22 223 lb 3.2 oz (101.2 kg)  04/06/22 221 lb (100.2 kg)   GEN: Well nourished, well developed, in no acute distress. HEENT: normal. Neck: Supple, no JVD, carotid bruits, or masses. Cardiac: RRR, no murmurs, rubs, or gallops. No clubbing, cyanosis, edema.  Radials/PT 2+ and equal bilaterally.  Respiratory:  Respirations regular and unlabored, clear to auscultation bilaterally. GI: Soft, nontender, nondistended. MS: No deformity or atrophy. Skin: Warm and dry, no rash. Neuro:  Strength and sensation are intact. Psych: Normal affect.  Assessment & Plan    CAD s/p BMS PCI to prox LAD - Stable with no anginal symptoms. No indication for ischemic evaluation.  GDMT includes Metoprolol and resumption of Rosuvastatin, Vascepa today. Heart healthy diet and regular cardiovascular exercise encouraged.  If needed could hold aspirin 5 days preoperatively per Dr. [name].   Chronic diastolic heart failure - Volume management per HD. No signs of volume overload today. Continue Metoprolol. GDMT limited by ESRD.  HLD, LDL goal <70 - 01/27/21 total cholesterol 130, HDL 44, triglycerides 107, LDL 66.  Presently on Rosuvastatin 40mg  daily. Denies myalgias. Updated lipid panel ordered.   DM2 - Continue to follow with PCP.  Obesity - Weight loss via diet and exercise encouraged. Discussed the impact being overweight would have on cardiovascular risk.   HTN - BP  well controlled. Continue current antihypertensive regimen.  She notes some elevated BP at start of HD - encouraged to log at home. Avoid additional agent today as relatively hypotensive.   ESRD on dialysis - Continue to follow with nephrology.  Disposition: Follow up in 6 months with Dr. [name] or APP.  Signed, Loel Dubonnet, NP 05/18/2022, 8:34 PM [place]

## 2022-05-18 NOTE — Patient Instructions (Addendum)
Medication Instructions:  Continue your current medications.  *If you need a refill on your cardiac medications before your next appointment, please call your pharmacy*   Lab Work: Your physician recommends that you return for lab work within the next month for fasting lipid panel and CMP  Please return for Lab work. You may come to the...   [facility] (3rd floor) [address] [place], [place] [address]  Open: 8am-Noon and 1pm-4:30pm  Please ring the doorbell on the small table when you exit the elevator and the Lab Tech will come get you  [place] at [facility] [address] [place], [place], [place] [address] Open: 8am-1pm, then 2pm-4:30pm   [place]- Please see attached locations sheet stapled to your lab work with address and hours.    If you have labs (blood work) drawn today and your tests are completely normal, you will receive your results only by: [place] (if you have MyChart) OR A paper copy in the mail If you have any lab test that is abnormal or we need to change your treatment, we will call you to review the results.   Testing/Procedures: Your EKG today showed normal sinus rhythm which is a good result!   Follow-Up: At [facility], you and your health needs are our priority.  As part of our continuing mission to provide you with exceptional heart care, we have created designated Provider Care Teams.  These Care Teams include your primary Cardiologist (physician) and Advanced Practice Providers (APPs -  Physician Assistants and Nurse Practitioners) who all work together to provide you with the care you need, when you need it.  We recommend signing up for the patient portal called "MyChart".  Sign up information is provided on this After Visit Summary.  MyChart is used to connect with patients for Virtual Visits (Telemedicine).  Patients are able to view lab/test results, encounter notes, upcoming appointments,  etc.  Non-urgent messages can be sent to your provider as well.   To learn more about what you can do with MyChart, go to [email or web address].    Your next appointment:   6 month(s)  The format for your next appointment:   In Person  Provider:   Glenetta Hew, MD     Other Instructions  Tips to Measure your Blood Pressure Correctly  Here's what you can do to ensure a correct reading:  Don't drink a caffeinated beverage or smoke during the 30 minutes before the test.  Sit quietly for five minutes before the test begins.  During the measurement, sit in a chair with your feet on the floor and your arm supported so your elbow is at about heart level.  The inflatable part of the cuff should completely cover at least 80% of your upper arm, and the cuff should be placed on bare skin, not over a shirt.  Don't talk during the measurement.  Blood pressure categories  Blood pressure category SYSTOLIC (upper number)  DIASTOLIC (lower number)  Normal Less than 120 mm Hg and Less than 80 mm Hg  Elevated 120-129 mm Hg and Less than 80 mm Hg  High blood pressure: Stage 1 hypertension 130-139 mm Hg or 80-89 mm Hg  High blood pressure: Stage 2 hypertension 140 mm Hg or higher or 90 mm Hg or higher  Hypertensive crisis (consult your doctor immediately) Higher than 180 mm Hg and/or Higher than 120 mm Hg  Source: American Heart Association and American Stroke Association. For more on  getting your blood pressure under control, buy Controlling Your Blood Pressure, a Special Health Report from [facility].   Blood Pressure Log   Date   Time  Blood Pressure  Example: Nov 1 9 AM 124/78                                              Heart Healthy Diet Recommendations: A low-salt diet is recommended. Meats should be grilled, baked, or boiled. Avoid fried foods. Focus on lean protein sources like fish or chicken with vegetables and fruits. The American Heart  Association is a Microbiologist!  American Heart Association Diet and Lifeystyle Recommendations   Exercise recommendations: The American Heart Association recommends 150 minutes of moderate intensity exercise weekly. Try 30 minutes of moderate intensity exercise 4-5 times per week. This could include walking, jogging, or swimming.   Important Information About Sugar

## 2022-05-20 ENCOUNTER — Other Ambulatory Visit: Payer: Self-pay | Admitting: Internal Medicine

## 2022-05-20 ENCOUNTER — Other Ambulatory Visit (HOSPITAL_COMMUNITY): Payer: Self-pay

## 2022-05-20 DIAGNOSIS — E113493 Type 2 diabetes mellitus with severe nonproliferative diabetic retinopathy without macular edema, bilateral: Secondary | ICD-10-CM

## 2022-05-20 MED ORDER — OXYCODONE-ACETAMINOPHEN 10-325 MG PO TABS
1.0000 | ORAL_TABLET | Freq: Every day | ORAL | 0 refills | Status: DC
  Filled 2022-05-20 – 2022-05-23 (×2): qty 150, 30d supply, fill #0
  Filled 2022-05-25: qty 83, 17d supply, fill #0
  Filled 2022-05-26: qty 67, 13d supply, fill #1

## 2022-05-23 ENCOUNTER — Other Ambulatory Visit (HOSPITAL_COMMUNITY): Payer: Self-pay

## 2022-05-23 MED ORDER — ACCU-CHEK GUIDE VI STRP
ORAL_STRIP | Freq: Three times a day (TID) | 3 refills | Status: DC
  Filled 2022-05-23: qty 300, 100d supply, fill #0

## 2022-05-23 MED ORDER — ACCU-CHEK SOFTCLIX LANCETS MISC
Freq: Three times a day (TID) | 3 refills | Status: DC
  Filled 2022-05-23: qty 300, 100d supply, fill #0

## 2022-05-24 ENCOUNTER — Other Ambulatory Visit (HOSPITAL_COMMUNITY): Payer: Self-pay

## 2022-05-25 ENCOUNTER — Telehealth: Payer: Self-pay | Admitting: *Deleted

## 2022-05-25 ENCOUNTER — Other Ambulatory Visit: Payer: Self-pay | Admitting: Nephrology

## 2022-05-25 ENCOUNTER — Other Ambulatory Visit (HOSPITAL_COMMUNITY): Payer: Self-pay

## 2022-05-25 DIAGNOSIS — N1330 Unspecified hydronephrosis: Secondary | ICD-10-CM

## 2022-05-25 NOTE — Telephone Encounter (Signed)
Patient called in from dialysis. States they think her left kidney is inflamed and are requesting she have a u/a and cx. States it would take 2 days for the results there and are requesting it be done at [facility]. Offered appt today, however, patient states she only makes urine in the morning and would not be able to leave a sample this afternoon. Appt given tomorrow at 0945.

## 2022-05-25 NOTE — Telephone Encounter (Signed)
Sounds reasonable. Thank you.

## 2022-05-26 ENCOUNTER — Ambulatory Visit (INDEPENDENT_AMBULATORY_CARE_PROVIDER_SITE_OTHER): Payer: 59 | Admitting: Student

## 2022-05-26 ENCOUNTER — Encounter: Payer: Self-pay | Admitting: Student

## 2022-05-26 ENCOUNTER — Other Ambulatory Visit (HOSPITAL_COMMUNITY): Payer: Self-pay

## 2022-05-26 VITALS — BP 143/76 | HR 102 | Ht 66.0 in | Wt 221.6 lb

## 2022-05-26 DIAGNOSIS — R109 Unspecified abdominal pain: Secondary | ICD-10-CM | POA: Insufficient documentation

## 2022-05-26 DIAGNOSIS — R310 Gross hematuria: Secondary | ICD-10-CM | POA: Diagnosis not present

## 2022-05-26 DIAGNOSIS — I10 Essential (primary) hypertension: Secondary | ICD-10-CM

## 2022-05-26 DIAGNOSIS — R319 Hematuria, unspecified: Secondary | ICD-10-CM | POA: Insufficient documentation

## 2022-05-26 LAB — POCT URINALYSIS DIPSTICK
Bilirubin, UA: NEGATIVE
Blood, UA: POSITIVE
Glucose, UA: POSITIVE — AB
Ketones, UA: NEGATIVE
Leukocytes, UA: NEGATIVE
Nitrite, UA: NEGATIVE
Protein, UA: POSITIVE — AB
Spec Grav, UA: 1.02 (ref 1.010–1.025)
Urobilinogen, UA: 0.2 E.U./dL
pH, UA: 8.5 — AB (ref 5.0–8.0)

## 2022-05-26 NOTE — Progress Notes (Addendum)
CC: Left Flank pain and hematuria  HPI:  [name] is a 46 y.o. female with a past medical history of end-stage renal disease who presents to the internal medicine center for evaluation of left flank pain and hematuria.  Patient is on dialysis Monday, Wednesday, Friday.  Patient states that she has been having this left flank pain and hematuria for the past 6 days.  Patient states that this started with hematuria, followed by flank pain.  She notes that this flank pain radiated to her groin.  Patient describes his pain to be achy in nature.  She said the highest severity was a 7/10 on the pain scale.  Patient states that this pain is constant. She notes that she takes Percocet for pain, and this is helped her.  Patient is really concerned about her hematuria as she is on dialysis.  She denies any fever, night sweats, chills, nausea, vomiting, or dysuria.  Past Medical History:  Diagnosis Date   Abnormal uterine bleeding (AUB)    Arthritis of knee    Left, Gel and cortisone injections @ [facility]   Asthma    prn inhaler   CAD S/P BMS PCI to prox LAD cardiologist--- dr Beverlyn Roux LAD to Mid LAD lesion, 90% stenosed. Post intervention - Vision BMS 3.0 mm x 18 mm (~3.5 mm) there is a 0% residual stenosis. ;   nuclear stress test-- 09-13-2018 low risk with no ischemia, nuclear ef 56%   Charcot foot due to diabetes mellitus ([place]) 11/2016   left 2018; right 2019   Depression    Diabetic foot ulcer ([place]) 04/22/2019   Diabetic neuropathy (HCC)    feet   Diabetic retinopathy, nonproliferative, severe ([place])    bilateral   ESRD (end stage renal disease) on dialysis [facility])    ?chronic interstitial nephritis  ([facility]   H/O non-ST elevation myocardial infarction (NSTEMI) 03/2016   Found in 90% mid LAD lesion treated with bare-metal stent (BMS) PCI - vision BMS 3.0 mm x 18 mm   History of MRSA infection 2007   Hyperlipidemia    Hypertension    IDA (iron  deficiency anemia)    Insulin dependent type 2 diabetes mellitus (HCC)    Iron deficiency anemia    takes iron supplement   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 ([place]) 05/23/2018   PAOD (peripheral arterial occlusive disease) ([place])    vascular--- dr Bridgett Larsson   S/P arteriovenous (AV) fistula creation 07/2017   S/p bare metal coronary artery stent 03/31/2016   BMS x1  to pLAD   Sickle cell trait ([place])    Ulcer of left foot due to type 2 diabetes mellitus ([place]) 06/03/2019     Current Outpatient Medications:    Accu-Chek Softclix Lancets lancets, Use to check blood sugar up to 3 (three) times daily., Disp: 300 each, Rfl: 3   glucose blood (ACCU-CHEK GUIDE) test strip, Use to check blood sugar up to 3 (three) times daily., Disp: 300 each, Rfl: 3   acetaminophen (TYLENOL) 500 MG tablet, Take 1,000 mg by mouth every 6 (six) hours as needed (for pain.)., Disp: , Rfl:    albuterol (PROVENTIL HFA) 108 (90 Base) MCG/ACT inhaler, Inhale 2 puffs into the lungs every 4 (four) hours as needed for coughing, wheezing, or shortness of breath., Disp: 20.1 g, Rfl: 2   aspirin EC 81 MG tablet, Take 1 tablet (81 mg total) by mouth daily. Swallow whole., Disp: 90 tablet,  Rfl: 3   benzonatate (TESSALON PERLES) 100 MG capsule, Take 2 capsules (200 mg total) by mouth 3 (three) times daily as needed for cough up to 14 days, Disp: 20 capsule, Rfl: 0   blood glucose meter kit and supplies, Dispense based on patient and insurance preference. Use up to four times daily as directed. (FOR ICD-10 E10.9, E11.9)., Disp: 1 each, Rfl: 0   Blood Glucose Monitoring Suppl (ACCU-CHEK GUIDE) w/Device KIT, 1 each by Does not apply route 3 (three) times daily., Disp: 1 kit, Rfl: 0   cetirizine (ZYRTEC) 5 MG tablet, Take 1 tablet (5 mg total) by mouth daily., Disp: 90 tablet, Rfl: 1   Cholecalciferol (VITAMIN D3) 50 MCG (2000 UT) CAPS, Take 4 capsules (8,000 Units total) by mouth daily., Disp: 360 capsule, Rfl: 3    Continuous Blood Gluc Receiver (DEXCOM G6 RECEIVER) DEVI, Use to check blood sugar at least 6 (six) times daily., Disp: 1 each, Rfl: 0   Continuous Blood Gluc Sensor (DEXCOM G6 SENSOR) MISC, Use to check blood sugar at least 6 (six) times daily., Disp: 9 each, Rfl: 3   Continuous Blood Gluc Transmit (DEXCOM G6 TRANSMITTER) MISC, Use to check blood sugar at least 6 times a day, Disp: 1 each, Rfl: 3   fluticasone (FLONASE) 50 MCG/ACT nasal spray, Place 2 sprays into both nostrils every night, Disp: 16 g, Rfl: 5   gabapentin (NEURONTIN) 100 MG capsule, Take 1 capsule (100 mg total) by mouth at bedtime., Disp: 90 capsule, Rfl: 1   guanFACINE (INTUNIV) 1 MG TB24 ER tablet, Take 1 tablet (1 mg total) by mouth daily., Disp: 30 tablet, Rfl: 0   ibuprofen (ADVIL) 800 MG tablet, Take 1 tablet (800 mg total) by mouth every 6 (six) hours as needed for pain., Disp: 11 tablet, Rfl: 0   icosapent Ethyl (VASCEPA) 1 g capsule, Take 2 capsules (2 g total) by mouth 2 (two) times daily., Disp: 120 capsule, Rfl: 5   injection device for insulin (INPEN 100-PINK-LILLY-HUMALOG) DEVI, Use to inject Humalog insulin for meals and correction insulin, Disp: 1 each, Rfl: 1   insulin degludec (TRESIBA) 100 UNIT/ML FlexTouch Pen, Inject 24 Units into the skin daily., Disp: 15 mL, Rfl: 1   insulin lispro (HUMALOG) 100 UNIT/ML cartridge, Use with inpen to inject at meal time and for correction insulin up to 40 units daily, Disp: 15 mL, Rfl: 11   Insulin Pen Needle 32G X 4 MM MISC, Use to inject insulin 4 (four) times daily., Disp: 400 each, Rfl: 3   Insulin Syringe-Needle U-100 31G X 15/64" 0.3 ML MISC, Use to inject insulin daily, Disp: 100 each, Rfl: 3   lanthanum (FOSRENOL) 1000 MG chewable tablet, Chew 1 tablet (1,000 mg total) by mouth 3 (three) times daily with meals., Disp: 270 tablet, Rfl: 4   megestrol (MEGACE) 40 MG tablet, Take 1 tablet (40 mg total) by mouth 2 (two) times daily., Disp: 60 tablet, Rfl: 6   metoprolol  tartrate (LOPRESSOR) 25 MG tablet, Take 1 tablet (25 mg total) by mouth 2 (two) times daily. Do not take the night before dialysis, and take 2 hours after dialysis., Disp: 180 tablet, Rfl: 1   multivitamin (RENA-VIT) TABS tablet, Take 1 tablet by mouth daily., Disp: 90 tablet, Rfl: 3   oxyCODONE-acetaminophen (PERCOCET) 10-325 MG tablet, Take 1 tablet by mouth 5 (five) times daily as needed, Disp: 150 tablet, Rfl: 0   rosuvastatin (CRESTOR) 40 MG tablet, Take 1 tablet (40 mg total) by mouth  daily., Disp: 90 tablet, Rfl: 3   Semaglutide,0.25 or 0.5MG/DOS, (OZEMPIC, 0.25 OR 0.5 MG/DOSE,) 2 MG/1.5ML SOPN, Inject 0.25 mg into the skin once a week., Disp: 1.5 mL, Rfl: 0   sertraline (ZOLOFT) 25 MG tablet, Take 1 tablet (25 mg total) by mouth daily., Disp: 30 tablet, Rfl: 1   sevelamer carbonate (RENVELA) 800 MG tablet, Take 3,200 mg by mouth 3 (three) times daily with meals. Take 800 mg with snacks as needed, Disp: , Rfl:    Sodium Fluoride 1.1 % PSTE, Brush teeth with small amount 2 times a day, Disp: 100 mL, Rfl: 5  Review of Systems:   GU: Patient reports left flank pain and hematuria   Physical Exam:  Vitals:   05/26/22 0954  BP: (!) 143/76  Pulse: (!) 102  SpO2: 100%  Weight: 221 lb 9.6 oz (100.5 kg)  Height: _0  (1.676 m)   General: Alert and orientated x3. Patient is sitting comfortably in the room  Eyes: EOM intact Head: Normocephalic, atraumatic  Cardio: Regular rate and rhythm.  Systolic murmur noted to left second intercostal space. Pulmonary: Clear to ausculation bilaterally with no rales, rhonchi, and crackles  Back: No midline tenderness, no step off or deformities noted.  Left lumbar paraspinal tenderness GU: No CVA tenderness noted Extremities: Right upper arm with AV fistula noted.    Assessment & Plan:   Essential hypertension Patient presents to the clinic with elevated blood pressure in the 140s.  She states that she has been having blood pressures in the 140s  to 150s for a long time now.  Patient has a goal of less than 130/80 as she is diabetic.  Patient states that her nephrologist told her to refrain from adding any more hypertension medications as this can bottom her out at dialysis.  Patient states that she has been going through a lot of stress and that this is contributing to her hypertension.  She declines medications at this point.  Continue her current medication regimen.  Plan: - Continue your metoprolol 25 mg twice daily. - Please keep a log of your blood pressure. - Follow-up in 2 to 3 weeks for follow-up for blood pressure.  Left flank pain Patient has a history of end-stage renal disease and on dialysis Monday Wednesday Fridays.  Patient has a fistula in her right upper arm.  Patient has been having left flank pain for the past 6 days.  She states that she also has been having hematuria.  She describes this pain to be a achy pain that radiates to her groin.  She denies having a history of kidney stones.  Given that she is an ESRD patient on dialysis, it is important to evaluate this.  Patient notes that she rarely makes urine, but for the past 6 days when she does make urine, it has been bloody.  Patient states that her pain has gotten better, as she takes Percocet for pain.  She states that this pain becomes worse with movements, and nothing makes it better.  This pain is constant.  She denies any nausea and vomiting.  Plan: - Obtain noncontrast CT of abdomen and pelvis to evaluate for kidney stone. -CT negative for any obstructing stone. CT also showed no evidence of hydronephrosis or pyelonephritis.  -This pain is likely musculoskeletal in nature and I inform the patient do uses lidocaine patches, tylenol, and stretches.  - Pain is well controlled with her Percocet - Patient denies any fever, night sweats, nausea, vomiting, or  dysuria.  I am less inclined to think this is a urinary tract infection or pyelonephritis - UA shows no nitrates  or leukocytes.  Hematuria Patient reports a 6-day history of gross hematuria.  She states that she does not make a lot of urine normally, she states that when she does make urine it is normally not bloody.  For the past 6 days it has became bloody.  She also reports having left flank pain that radiates to her groin.  Plan: - UA negative for nitrates or leukocytes - Given her left flank pain with hematuria, I am leaning towards nephrolithiasis, obtain noncontrast CT of abdomen and pelvis. - CT showing no pyelonephritis or hydronephrosis. There is no obstructing stone. Given that there is still gross hematuria, I will refer to urology. -Refer to urology    Patient seen with Dr.  Wyn Forster, DO PGY-1 Internal Medicine Resident  Pager: [phone]

## 2022-05-26 NOTE — Assessment & Plan Note (Addendum)
Patient has a history of end-stage renal disease and on dialysis Monday Wednesday Fridays.  Patient has a fistula in her right upper arm.  Patient has been having left flank pain for the past 6 days.  She states that she also has been having hematuria.  She describes this pain to be a achy pain that radiates to her groin.  She denies having a history of kidney stones.  Given that she is an ESRD patient on dialysis, it is important to evaluate this.  Patient notes that she rarely makes urine, but for the past 6 days when she does make urine, it has been bloody.  Patient states that her pain has gotten better, as she takes Percocet for pain.  She states that this pain becomes worse with movements, and nothing makes it better.  This pain is constant.  She denies any nausea and vomiting.  Plan: - Obtain noncontrast CT of abdomen and pelvis to evaluate for kidney stone. -CT negative for any obstructing stone. CT also showed no evidence of hydronephrosis or pyelonephritis.  -This pain is likely musculoskeletal in nature and I inform the patient do uses lidocaine patches, tylenol, and stretches.  - Pain is well controlled with her Percocet - Patient denies any fever, night sweats, nausea, vomiting, or dysuria.  I am less inclined to think this is a urinary tract infection or pyelonephritis - UA shows no nitrates or leukocytes.

## 2022-05-26 NOTE — Patient Instructions (Signed)
Jane [name] ,Thank you for allowing me to take part in your care today.  Here are your instructions.  1.  Today we discussed you having a possible kidney stone.  We do not think it is a UTI infection.  We will try to get you a CT scan today.  Please await phone call for your CT results  2.  Please monitor your blood pressure at home.  You did have an elevated blood pressure today.  We did discuss possible treatment today, but due to your stress and nephrology recommendations, we will hold off.  3.  Please follow-up in 2 to 3 weeks for diabetes and hypertension.  At this time we can also evaluate your urine.   Thank you, Dr. Posey Pronto  If you have any other questions please contact the internal medicine clinic at [phone]

## 2022-05-26 NOTE — Assessment & Plan Note (Addendum)
Patient reports a 6-day history of gross hematuria.  She states that she does not make a lot of urine normally, she states that when she does make urine it is normally not bloody.  For the past 6 days it has became bloody.  She also reports having left flank pain that radiates to her groin.  Plan: - UA negative for nitrates or leukocytes - Given her left flank pain with hematuria, I am leaning towards nephrolithiasis, obtain noncontrast CT of abdomen and pelvis. - CT showing no pyelonephritis or hydronephrosis. There is no obstructing stone. Given that there is still gross hematuria, I will refer to urology. -Refer to urology

## 2022-05-26 NOTE — Assessment & Plan Note (Signed)
Patient presents to the clinic with elevated blood pressure in the 140s.  She states that she has been having blood pressures in the 140s to 150s for a long time now.  Patient has a goal of less than 130/80 as she is diabetic.  Patient states that her nephrologist told her to refrain from adding any more hypertension medications as this can bottom her out at dialysis.  Patient states that she has been going through a lot of stress and that this is contributing to her hypertension.  She declines medications at this point.  Continue her current medication regimen.  Plan: - Continue your metoprolol 25 mg twice daily. - Please keep a log of your blood pressure. - Follow-up in 2 to 3 weeks for follow-up for blood pressure.

## 2022-05-27 ENCOUNTER — Telehealth: Payer: Self-pay | Admitting: Student

## 2022-05-27 ENCOUNTER — Ambulatory Visit (HOSPITAL_COMMUNITY)
Admission: RE | Admit: 2022-05-27 | Discharge: 2022-05-27 | Disposition: A | Discharge status: Home / Self Care | Payer: 59 | Source: Ambulatory Visit | Attending: Internal Medicine | Admitting: Internal Medicine

## 2022-05-27 DIAGNOSIS — R109 Unspecified abdominal pain: Secondary | ICD-10-CM | POA: Diagnosis present

## 2022-05-27 NOTE — Addendum Note (Signed)
Addended by: Leigh Aurora on: 05/27/2022 05:57 PM   Modules accepted: Orders

## 2022-05-27 NOTE — Telephone Encounter (Signed)
I speak to the patient via telephone about her CT results.  I informed her that she has no evidence of pyelonephritis or hydronephrosis.  I also informed the patient that she has no evidence of an obstruction caused by a kidney stone.  Patient is understanding of all the results.  I informed the patient that her pain must most likely be musculoskeletal given that she was tender on exam.  I also inform her that I have referred her to urology for her gross hematuria.  She is agreeable to go.  Patient is relieved she got some answers before the weekend.

## 2022-05-27 NOTE — Progress Notes (Signed)
Internal Medicine Clinic Attending  I saw and evaluated the patient.  I personally confirmed the key portions of the history and exam documented by Dr. Posey Pronto and I reviewed pertinent patient test results.  The assessment, diagnosis, and plan were formulated together and I agree with the documentation in the resident's note.    Patient with ESRD on MWF HD presents with 6 days of hematuria, followed by 4 days of constant left flank pain, radiating down her left groin. She has no history of kidney stones. No fevers, chills, or dysuria. She has chronic intermittent nausea with dialysis, this has not changed acutely. On exam, she is afebrile, VSS. Left flank is tender to deep palpation, but no overt CVAT. She appears well.   I think she could have nephrolithiasis, so I want to evaluate her with CT to look for stone & any hydronephrosis. This is scheduled for 4pm tomorrow (7/14).

## 2022-05-31 ENCOUNTER — Ambulatory Visit
Admission: RE | Admit: 2022-05-31 | Discharge: 2022-05-31 | Disposition: A | Discharge status: Home / Self Care | Payer: 59 | Source: Ambulatory Visit | Attending: Nephrology | Admitting: Nephrology

## 2022-05-31 DIAGNOSIS — N1330 Unspecified hydronephrosis: Secondary | ICD-10-CM

## 2022-06-08 ENCOUNTER — Other Ambulatory Visit (HOSPITAL_COMMUNITY): Payer: Self-pay

## 2022-06-09 ENCOUNTER — Other Ambulatory Visit (HOSPITAL_COMMUNITY): Payer: Self-pay

## 2022-06-10 ENCOUNTER — Other Ambulatory Visit (HOSPITAL_COMMUNITY): Payer: Self-pay

## 2022-06-15 ENCOUNTER — Other Ambulatory Visit (HOSPITAL_COMMUNITY): Payer: Self-pay

## 2022-06-22 ENCOUNTER — Other Ambulatory Visit (HOSPITAL_COMMUNITY): Payer: Self-pay

## 2022-06-22 MED ORDER — OXYCODONE-ACETAMINOPHEN 10-325 MG PO TABS
1.0000 | ORAL_TABLET | Freq: Every day | ORAL | 0 refills | Status: DC | PRN
  Filled 2022-06-22 – 2022-06-24 (×2): qty 150, 30d supply, fill #0

## 2022-06-24 ENCOUNTER — Encounter (HOSPITAL_COMMUNITY): Payer: Self-pay

## 2022-06-24 ENCOUNTER — Other Ambulatory Visit (HOSPITAL_COMMUNITY): Payer: Self-pay

## 2022-06-27 NOTE — Progress Notes (Unsigned)
CC: ***  HPI:   [name] is a 46 y.o. ***   LAST SEEN 7-13  HYPERTENSION Plan: - Continue your metoprolol 25 mg twice daily. - Please keep a log of your blood pressure. - Follow-up in 2 to 3 weeks for follow-up for blood pressure.   FLANK PAIN Plan: - Obtain noncontrast CT of abdomen and pelvis to evaluate for kidney stone. -CT negative for any obstructing stone. CT also showed no evidence of hydronephrosis or pyelonephritis.  -This pain is likely musculoskeletal in nature and I inform the patient do uses lidocaine patches, tylenol, and stretches.  - Pain is well controlled with her Percocet - Patient denies any fever, night sweats, nausea, vomiting, or dysuria.  I am less inclined to think this is a urinary tract infection or pyelonephritis - UA shows no nitrates or leukocytes.   HEMATURIA Plan: - UA negative for nitrates or leukocytes - Given her left flank pain with hematuria, I am leaning towards nephrolithiasis, obtain noncontrast CT of abdomen and pelvis. - CT showing no pyelonephritis or hydronephrosis. There is no obstructing stone. Given that there is still gross hematuria, I will refer to urology. -Refer to urology   Pain Onset-x Location-x Severity-x Provoking-x Alleviating-x ROS: bowel changes in color or consistency, nausea, vomiting     Past Medical History:  Diagnosis Date   Abnormal uterine bleeding (AUB)    Arthritis of knee    Left, Gel and cortisone injections @ [facility]   Asthma    prn inhaler   CAD S/P BMS PCI to prox LAD cardiologist--- dr Beverlyn Roux LAD to Mid LAD lesion, 90% stenosed. Post intervention - Vision BMS 3.0 mm x 18 mm (~3.5 mm) there is a 0% residual stenosis. ;   nuclear stress test-- 09-13-2018 low risk with no ischemia, nuclear ef 56%   Charcot foot due to diabetes mellitus ([place]) 11/2016   left 2018; right 2019   Depression    Diabetic foot ulcer ([place]) 04/22/2019   Diabetic neuropathy (HCC)     feet   Diabetic retinopathy, nonproliferative, severe ([place])    bilateral   ESRD (end stage renal disease) on dialysis [facility])    ?chronic interstitial nephritis  ([facility]   H/O non-ST elevation myocardial infarction (NSTEMI) 03/2016   Found in 90% mid LAD lesion treated with bare-metal stent (BMS) PCI - vision BMS 3.0 mm x 18 mm   History of MRSA infection 2007   Hyperlipidemia    Hypertension    IDA (iron deficiency anemia)    Insulin dependent type 2 diabetes mellitus (HCC)    Iron deficiency anemia    takes iron supplement   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  General:   awake and alert, sitting comfortably in chair, cooperative, not in acute distress Skin:   warm and dry, intact without any obvious lesions or scars, no rashes or lesions  Head:  normocephalic and atraumatic, oral mucosa moist with good dentition, no lymphadenopathy Eyes:   extraocular movements intact, conjunctivae pink, pupils round and reactive to light, no periorbital swelling or scleral icterus Ears:   pinnae normal, no discharge or external lesions  Nose:   symmetrical and without mucosal inflammation, no external lesions or discharge Lungs:   normal respiratory effort, breathing unlabored, symmetrical chest rise, no crackles or wheezing Cardiac:   regular rate and rhythm, normal S1 and S2, capillary refill 2-3 seconds, dorsalis pedis pulses intact bilaterally, no pitting edema Abdomen:   soft and  non-distended, normoactive bowel sounds present in all four quadrants, no guarding or palpable masses Musculoskeletal:   full range of motion in joints, motor strength 5 /5 in all four extremities, no obvious deformities or joint tenderness Neurologic:   oriented to person-place-time, moving all extremities, sensation to light touch intact, no facial droop Psychiatric:   mood and affect normal, intelligible speech    Assessment & Plan:   No problem-specific Assessment & Plan notes found for this encounter.     See Encounters Tab for problem based charting.  Patient {GC/GE:[id number]::"discussed with","seen with"} Dr. {NAMES:[id number]::"Guilloud","Hoffman","Mullen","Narendra","Williams","Vincent"}

## 2022-06-28 ENCOUNTER — Other Ambulatory Visit (HOSPITAL_COMMUNITY): Payer: Self-pay

## 2022-06-28 ENCOUNTER — Ambulatory Visit (INDEPENDENT_AMBULATORY_CARE_PROVIDER_SITE_OTHER): Payer: 59 | Admitting: Student

## 2022-06-28 DIAGNOSIS — K5900 Constipation, unspecified: Secondary | ICD-10-CM | POA: Diagnosis not present

## 2022-06-28 DIAGNOSIS — R112 Nausea with vomiting, unspecified: Secondary | ICD-10-CM

## 2022-06-28 MED ORDER — METOCLOPRAMIDE HCL 5 MG PO TABS
5.0000 mg | ORAL_TABLET | Freq: Three times a day (TID) | ORAL | 1 refills | Status: DC | PRN
  Filled 2022-06-28: qty 30, 10d supply, fill #0

## 2022-06-28 MED ORDER — SENNOSIDES-DOCUSATE SODIUM 8.6-50 MG PO TABS
1.0000 | ORAL_TABLET | Freq: Every day | ORAL | 2 refills | Status: DC
  Filled 2022-06-28: qty 90, 90d supply, fill #0

## 2022-06-28 MED ORDER — POLYETHYLENE GLYCOL 3350 17 GM/SCOOP PO POWD: 1.0000 | Freq: Once | ORAL | Status: DC
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
## 2022-06-30 NOTE — Progress Notes (Signed)
Internal Medicine Clinic Attending  I personally confirmed the key portions of the history and exam documented by Dr. Jodi Mourning and I reviewed pertinent patient test results.  The assessment, diagnosis, and plan were formulated together and I agree with the documentation in the resident's note.

## 2022-07-03 ENCOUNTER — Encounter: Payer: Self-pay | Admitting: Student

## 2022-07-20 ENCOUNTER — Other Ambulatory Visit (HOSPITAL_COMMUNITY): Payer: Self-pay

## 2022-07-20 MED ORDER — OXYCODONE-ACETAMINOPHEN 10-325 MG PO TABS
1.0000 | ORAL_TABLET | ORAL | 0 refills | Status: DC
  Filled 2022-07-20 (×2): qty 150, 30d supply, fill #0
  Filled 2022-07-25: qty 136, 28d supply, fill #0
  Filled 2022-07-25: qty 14, 2d supply, fill #0

## 2022-07-25 ENCOUNTER — Other Ambulatory Visit (HOSPITAL_COMMUNITY): Payer: Self-pay

## 2022-07-29 ENCOUNTER — Other Ambulatory Visit (HOSPITAL_COMMUNITY): Payer: Self-pay

## 2022-08-03 ENCOUNTER — Other Ambulatory Visit: Payer: Self-pay | Admitting: Urology

## 2022-08-09 ENCOUNTER — Encounter: Payer: Self-pay | Admitting: Internal Medicine

## 2022-08-09 ENCOUNTER — Ambulatory Visit (INDEPENDENT_AMBULATORY_CARE_PROVIDER_SITE_OTHER): Payer: 59 | Admitting: Internal Medicine

## 2022-08-09 ENCOUNTER — Other Ambulatory Visit: Payer: Self-pay

## 2022-08-09 VITALS — BP 187/102 | HR 94 | Temp 98.2°F | Ht 65.0 in | Wt 220.4 lb

## 2022-08-09 DIAGNOSIS — N186 End stage renal disease: Secondary | ICD-10-CM | POA: Diagnosis not present

## 2022-08-09 DIAGNOSIS — Z139 Encounter for screening, unspecified: Secondary | ICD-10-CM

## 2022-08-09 DIAGNOSIS — Z992 Dependence on renal dialysis: Secondary | ICD-10-CM | POA: Diagnosis not present

## 2022-08-09 DIAGNOSIS — K582 Mixed irritable bowel syndrome: Secondary | ICD-10-CM | POA: Diagnosis not present

## 2022-08-09 NOTE — Assessment & Plan Note (Signed)
Patient reporting she missed yesterdays HD session. She goes MWF. She reports loose stool/diarrhea yesterday and intentionally did not go to HD. She had option to make up session today but opted to come to clinic visit for further evaluation of her N/V/D and constipation. She denies any SOB or extra fluid retention. She has not taken her metoprolol today either.   Heart is regular rate and rhythm. Lungs are clear to auscultation. No evidence of volume overload.  Blood pressure elevated in the setting of missed HD and medication non-adherence. Asymptomatic. No evidence to suggest need for urgent/emergent dialysis. She has regularly scheduled chair time tomorrow. - attend HD tomorrow - BP medication adherence

## 2022-08-09 NOTE — Patient Instructions (Signed)
Dear Mrs. Laurence Ferrari,  We discussed your nausea, diarrhea, and constipation. I have placed a referral to GI.  We also recommend that you attend your dialysis session tomorrow. On dialysis day, we recommend that you decrease your miralax use.

## 2022-08-09 NOTE — Progress Notes (Signed)
CC: constipation/stomach issues  HPI:Jane [name] is a 46 y.o. female who presents for evaluation of n/d, constipation. Please see individual problem based A/P for details.  Patient complains of frequent constipation and diarrhea. No blood noted in stools. Does have associated abdominal cramping/discomfort. She does have normal stool between these episodes. Was recently seen in clinic and started on Reglan, senna, and miralax. She has been taking Reglan and Miralax with control over symptoms. No benefit noted from Senna. Reports daughter has irritable bowel syndrome.  Additionally, patient complains of nausea, however this appears to be associated with dialysis. She did have normal gastric emptying study but feels as though this test was inadequate. She has not been evaluated by GI as outpatient before. Has never had colonoscopy.  Patient reporting she missed yesterdays HD session. She goes MWF. She reports loose stool/diarrhea yesterday and intentionally did not go to HD. She had option to make up session today but opted to come to clinic visit for further evaluation of her N/V/D and constipation. She denies any SOB or extra fluid retention. She has not taken her metoprolol today either.    Depression, PHQ-9: Based on the patients  Jane [name] Visit from 08/09/2022 in [place]  PHQ-9 Total Score 10      score we have 10.  Past Medical History:  Diagnosis Date   Abnormal uterine bleeding (AUB)    Arthritis of knee    Left, Gel and cortisone injections @ [facility]   Asthma    prn inhaler   CAD S/P BMS PCI to prox LAD cardiologist--- dr Beverlyn Roux LAD to Mid LAD lesion, 90% stenosed. Post intervention - Vision BMS 3.0 mm x 18 mm (~3.5 mm) there is a 0% residual stenosis. ;   nuclear stress test-- 09-13-2018 low risk with no ischemia, nuclear ef 56%   Charcot foot due to diabetes mellitus ([place]) 11/2016   left 2018; right 2019    Depression    Diabetic foot ulcer ([place]) 04/22/2019   Diabetic neuropathy (HCC)    feet   Diabetic retinopathy, nonproliferative, severe ([place])    bilateral   ESRD (end stage renal disease) on dialysis [facility])    ?chronic interstitial nephritis  ([facility]   H/O non-ST elevation myocardial infarction (NSTEMI) 03/2016   Found in 90% mid LAD lesion treated with bare-metal stent (BMS) PCI - vision BMS 3.0 mm x 18 mm   History of MRSA infection 2007   Hyperlipidemia    Hypertension    IDA (iron deficiency anemia)    Insulin dependent type 2 diabetes mellitus (HCC)    Iron deficiency anemia    takes iron supplement   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   Cardiovascular: Negative.   Gastrointestinal:  Positive for abdominal pain, constipation, diarrhea, nausea and vomiting.  Genitourinary: Negative.   Neurological: Negative.      Physical Exam: Vitals:   08/09/22 1113 08/09/22 1147  BP: (!) 175/89 (!) 187/102  Pulse: 88 94  Temp: 98.2 F (  36.8 C)   TempSrc: Oral   SpO2: 100%   Weight: 220 lb 6.4 oz (100 kg)   Height: '5\' 5"'$  (1.651 m)      General: NAD HEENT: Conjunctiva nl , antiicteric sclerae, moist mucous membranes, no exudate or erythema Cardiovascular: Normal rate, regular rhythm.  No murmurs, rubs, or gallops, no evidence of volume overload Pulmonary : Equal breath sounds, No wheezes, rales, or rhonchi Abdominal: soft, nontender,  bowel sounds present Ext: No edema in lower extremities, no tenderness to palpation of lower extremities.   Assessment & Plan:    See Encounters Tab for problem based charting.  History strongly suggestive of IBS. Miralax use may also be contributing to increased loose stools. Advised patient decrease Miralax on HD days to facilitate HD adherence.  Patient is age appropriate for colonoscopy screening.  - GI referral for colonoscopy.  - continue Reglan and miralax. Self titrate miralax as necessary.  Blood pressure elevated in the setting of missed HD and medication non-adherence. Asymptomatic. No evidence to suggest need for urgent/emergent dialysis. She has regularly scheduled chair time tomorrow. - attend HD tomorrow - BP medication adherence  Patient discussed with Dr.  Saverio Danker

## 2022-08-09 NOTE — Assessment & Plan Note (Addendum)
Patient complains of frequent constipation and diarrhea. No blood noted in stools. Does have associated abdominal cramping/discomfort. She does have normal stool between these episodes. Was recently seen in clinic and started on Reglan, senna, and miralax. She has been taking Reglan and Miralax with control over symptoms. No benefit noted from Senna. Reports daughter has irritable bowel syndrome.  Additionally, patient complains of nausea, however this appears to be associated with dialysis. She did have normal gastric emptying study but feels as though this test was inadequate. She has not been evaluated by GI as outpatient before. Has never had colonoscopy.  History strongly suggestive of IBS. Miralax use may also be contributing to increased loose stools. Advised patient decrease Miralax on HD days to facilitate HD adherence.  Patient is age appropriate for colonoscopy screening.  - GI referral for colonoscopy.  - continue Reglan and miralax. Self titrate miralax as necessary.

## 2022-08-11 NOTE — Progress Notes (Signed)
Internal Medicine Clinic Attending  Case discussed with Dr. Elliot Gurney  At the time of the visit.  We reviewed the resident's history and exam and pertinent patient test results.  I agree with the assessment, diagnosis, and plan of care documented in the resident's note. Symptoms sound consistent with mixed IBS, managing with bowel regimen as needed. Will need regular follow-up scheduled for diabetes management, I have sent a message to the scheduling staff.

## 2022-08-11 NOTE — Addendum Note (Signed)
Addended by: Charise Killian on: 08/11/2022 11:08 AM   Modules accepted: Level of Service

## 2022-08-16 ENCOUNTER — Inpatient Hospital Stay (HOSPITAL_BASED_OUTPATIENT_CLINIC_OR_DEPARTMENT_OTHER): Admission: RE | Admit: 2022-08-16 | Payer: 59 | Source: Ambulatory Visit

## 2022-08-16 ENCOUNTER — Ambulatory Visit (HOSPITAL_BASED_OUTPATIENT_CLINIC_OR_DEPARTMENT_OTHER): Payer: 59

## 2022-08-17 ENCOUNTER — Other Ambulatory Visit (HOSPITAL_COMMUNITY): Payer: Self-pay

## 2022-08-17 MED ORDER — OXYCODONE-ACETAMINOPHEN 10-325 MG PO TABS
1.0000 | ORAL_TABLET | Freq: Every day | ORAL | 0 refills | Status: DC
  Filled 2022-08-24: qty 150, 30d supply, fill #0

## 2022-08-19 ENCOUNTER — Encounter (HOSPITAL_COMMUNITY): Payer: Self-pay | Admitting: Urology

## 2022-08-22 NOTE — Patient Instructions (Addendum)
SURGICAL WAITING ROOM VISITATION Patients having surgery or a procedure may have no more than 2 support people in the waiting area - these visitors may rotate.   Children under the age of 17 must have an adult with them who is not the patient. If the patient needs to stay at the hospital during part of their recovery, the visitor guidelines for inpatient rooms apply. Pre-op nurse will coordinate an appropriate time for 1 support person to accompany patient in pre-op.  This support person may not rotate.    Please refer to the [facility] website for the visitor guidelines for Inpatients (after your surgery is over and you are in a regular room).      Your procedure is scheduled on: 08-30-22   Report to [facility] Main Entrance    Report to admitting at 6:45 AM   Call this number if you have problems the morning of surgery [phone]   Do not eat food :After Midnight.   After Midnight you may have the following liquids until 5:45 AM DAY OF SURGERY  Water Non-Citrus Juices (without pulp, NO RED) Carbonated Beverages Black Coffee (NO MILK/CREAM OR CREAMERS, sugar ok)  Clear Tea (NO MILK/CREAM OR CREAMERS, sugar ok) regular and decaf                             Plain Jell-O (NO RED)                                           Fruit ices (not with fruit pulp, NO RED)                                     Popsicles (NO RED)                                                               Sports drinks like Gatorade (NO RED)                       If you have questions, please contact your surgeon's office.   FOLLOW ANY ADDITIONAL PRE OP INSTRUCTIONS YOU RECEIVED FROM YOUR SURGEON'S OFFICE!!!     Oral Hygiene is also important to reduce your risk of infection.                                    Remember - BRUSH YOUR TEETH THE MORNING OF SURGERY WITH YOUR REGULAR TOOTHPASTE   Do NOT smoke after Midnight   Take these medicines the morning of surgery with A SIP OF WATER:    Vascepa  Zoloft  Okay to use Albuterol inhaler    How to Manage Your Diabetes Before and After Surgery  Why is it important to control my blood sugar before and after surgery? Improving blood sugar levels before and after surgery helps healing and can limit problems. A way of improving blood sugar control is eating a healthy diet by:  Eating less  sugar and carbohydrates  Increasing activity/exercise  Talking with your doctor about reaching your blood sugar goals High blood sugars (greater than 180 mg/dL) can raise your risk of infections and slow your recovery, so you will need to focus on controlling your diabetes during the weeks before surgery. Make sure that the doctor who takes care of your diabetes knows about your planned surgery including the date and location.  How do I manage my blood sugar before surgery? Check your blood sugar at least 4 times a day, starting 2 days before surgery, to make sure that the level is not too high or low. Check your blood sugar the morning of your surgery when you wake up and every 2 hours until you get to the Short Stay unit. If your blood sugar is less than 70 mg/dL, you will need to treat for low blood sugar: Do not take insulin. Treat a low blood sugar (less than 70 mg/dL) with  cup of clear juice (cranberry or apple), 4 glucose tablets, OR glucose gel. Recheck blood sugar in 15 minutes after treatment (to make sure it is greater than 70 mg/dL). If your blood sugar is not greater than 70 mg/dL on recheck, call [phone] for further instructions. Report your blood sugar to the short stay nurse when you get to Short Stay.  If you are admitted to the hospital after surgery: Your blood sugar will be checked by the staff and you will probably be given insulin after surgery (instead of oral diabetes medicines) to make sure you have good blood sugar levels. The goal for blood sugar control after surgery is 80-180 mg/dL.   WHAT DO I DO ABOUT  MY DIABETES MEDICATION?  Do not take oral diabetes medicines (pills) the morning of surgery.  THE NIGHT BEFORE SURGERY:  Take 12 units of Tresiba insulin.   THE MORNING OF SURGERY:  Take 50% of Humalog if CBG 220 or higher.  DO NOT TAKE THE FOLLOWING 7 DAYS PRIOR TO SURGERY: Ozempic, Wegovy, Rybelsus (Semaglutide), Byetta (exenatide), Bydureon (exenatide ER), Victoza, Saxenda (liraglutide), or Trulicity (dulaglutide) Mounjaro (Tirzepatide) Adlyxin (Lixisenatide), Polyethylene Glycol Loxenatide.  Reviewed and Endorsed by [facility] Patient Education Committee, August 2015                               You may not have any metal on your body including hair pins, jewelry, and body piercing             Do not wear make-up, lotions, powders, perfumes or deodorant  Do not wear nail polish including gel and S&S, artificial/acrylic nails, or any other type of covering on natural nails including finger and toenails. If you have artificial nails, gel coating, etc. that needs to be removed by a nail salon please have this removed prior to surgery or surgery may need to be canceled/ delayed if the surgeon/ anesthesia feels like they are unable to be safely monitored.   Do not shave  48 hours prior to surgery.    Do not bring valuables to the hospital. [place].   Contacts, dentures or bridgework may not be worn into surgery.  DO NOT [place]. PHARMACY WILL DISPENSE MEDICATIONS LISTED ON YOUR MEDICATION LIST TO YOU DURING YOUR ADMISSION [place]!    Patients discharged on the day of surgery will not be allowed to drive home.  Someone NEEDS to stay with you for the first 24 hours after anesthesia.   Special Instructions: Bring a copy of your healthcare power of attorney and living will documents the day of surgery if you haven't scanned them before.              Please read over the following fact sheets you were given:  IF YOU HAVE QUESTIONS ABOUT YOUR PRE-OP INSTRUCTIONS PLEASE CALL [place]  If you received a COVID test during your pre-op visit  it is requested that you wear a mask when out in public, stay away from anyone that may not be feeling well and notify your surgeon if you develop symptoms. If you test positive for Covid or have been in contact with anyone that has tested positive in the last 10 days please notify you surgeon.  [place] - Preparing for Surgery Before surgery, you can play an important role.  Because skin is not sterile, your skin needs to be as free of germs as possible.  You can reduce the number of germs on your skin by washing with CHG (chlorahexidine gluconate) soap before surgery.  CHG is an antiseptic cleaner which kills germs and bonds with the skin to continue killing germs even after washing. Please DO NOT use if you have an allergy to CHG or antibacterial soaps.  If your skin becomes reddened/irritated stop using the CHG and inform your nurse when you arrive at Short Stay. Do not shave (including legs and underarms) for at least 48 hours prior to the first CHG shower.  You may shave your face/neck.  Please follow these instructions carefully:  1.  Shower with CHG Soap the night before surgery and the  morning of surgery.  2.  If you choose to wash your hair, wash your hair first as usual with your normal  shampoo.  3.  After you shampoo, rinse your hair and body thoroughly to remove the shampoo.                             4.  Use CHG as you would any other liquid soap.  You can apply chg directly to the skin and wash.  Gently with a scrungie or clean washcloth.  5.  Apply the CHG Soap to your body ONLY FROM THE NECK DOWN.   Do   not use on face/ open                           Wound or open sores. Avoid contact with eyes, ears mouth and   genitals (private parts).                       Wash face,  Genitals (private parts) with your normal soap.             6.  Wash  thoroughly, paying special attention to the area where your    surgery  will be performed.  7.  Thoroughly rinse your body with warm water from the neck down.  8.  DO NOT shower/wash with your normal soap after using and rinsing off the CHG Soap.                9.  Pat yourself dry with a clean towel.            10.  Wear clean pajamas.  11.  Place clean sheets on your bed the night of your first shower and do not  sleep with pets. Day of Surgery : Do not apply any lotions/deodorants the morning of surgery.  Please wear clean clothes to the hospital/surgery center.  FAILURE TO FOLLOW THESE INSTRUCTIONS MAY RESULT IN THE CANCELLATION OF YOUR SURGERY  PATIENT SIGNATURE_________________________________  NURSE SIGNATURE__________________________________  ________________________________________________________________________

## 2022-08-23 ENCOUNTER — Encounter (HOSPITAL_COMMUNITY)
Admission: RE | Admit: 2022-08-23 | Discharge: 2022-08-23 | Disposition: A | Discharge status: Home / Self Care | Payer: 59 | Source: Ambulatory Visit | Attending: Urology | Admitting: Urology

## 2022-08-23 ENCOUNTER — Other Ambulatory Visit: Payer: Self-pay

## 2022-08-23 ENCOUNTER — Encounter (HOSPITAL_COMMUNITY): Payer: Self-pay

## 2022-08-23 VITALS — BP 144/79 | HR 86 | Temp 98.7°F | Resp 16 | Ht 65.0 in

## 2022-08-23 DIAGNOSIS — Z01818 Encounter for other preprocedural examination: Secondary | ICD-10-CM

## 2022-08-23 DIAGNOSIS — I252 Old myocardial infarction: Secondary | ICD-10-CM | POA: Insufficient documentation

## 2022-08-23 DIAGNOSIS — I5032 Chronic diastolic (congestive) heart failure: Secondary | ICD-10-CM | POA: Insufficient documentation

## 2022-08-23 DIAGNOSIS — N329 Bladder disorder, unspecified: Secondary | ICD-10-CM | POA: Diagnosis not present

## 2022-08-23 DIAGNOSIS — Z01812 Encounter for preprocedural laboratory examination: Secondary | ICD-10-CM | POA: Diagnosis present

## 2022-08-23 DIAGNOSIS — Z794 Long term (current) use of insulin: Secondary | ICD-10-CM | POA: Insufficient documentation

## 2022-08-23 DIAGNOSIS — E1122 Type 2 diabetes mellitus with diabetic chronic kidney disease: Secondary | ICD-10-CM | POA: Insufficient documentation

## 2022-08-23 DIAGNOSIS — Z87891 Personal history of nicotine dependence: Secondary | ICD-10-CM | POA: Diagnosis not present

## 2022-08-23 DIAGNOSIS — I2510 Atherosclerotic heart disease of native coronary artery without angina pectoris: Secondary | ICD-10-CM | POA: Insufficient documentation

## 2022-08-23 DIAGNOSIS — N186 End stage renal disease: Secondary | ICD-10-CM | POA: Diagnosis not present

## 2022-08-23 DIAGNOSIS — Z992 Dependence on renal dialysis: Secondary | ICD-10-CM | POA: Diagnosis not present

## 2022-08-23 DIAGNOSIS — I120 Hypertensive chronic kidney disease with stage 5 chronic kidney disease or end stage renal disease: Secondary | ICD-10-CM | POA: Diagnosis not present

## 2022-08-23 HISTORY — DX: Other specified postprocedural states: Z98.890

## 2022-08-23 HISTORY — DX: Acute myocardial infarction, unspecified: I21.9

## 2022-08-23 HISTORY — DX: Other specified postprocedural states: R11.2

## 2022-08-23 HISTORY — DX: Dyspnea, unspecified: R06.00

## 2022-08-23 LAB — CBC
HCT: 35.7 % — ABNORMAL LOW (ref 36.0–46.0)
Hemoglobin: 10.9 g/dL — ABNORMAL LOW (ref 12.0–15.0)
MCH: 27.2 pg (ref 26.0–34.0)
MCHC: 30.5 g/dL (ref 30.0–36.0)
MCV: 89 fL (ref 80.0–100.0)
Platelets: 216 10*3/uL (ref 150–400)
RBC: 4.01 MIL/uL (ref 3.87–5.11)
RDW: 16.7 % — ABNORMAL HIGH (ref 11.5–15.5)
WBC: 5.4 10*3/uL (ref 4.0–10.5)
nRBC: 0 % (ref 0.0–0.2)

## 2022-08-23 LAB — GLUCOSE, CAPILLARY: Glucose-Capillary: 168 mg/dL — ABNORMAL HIGH (ref 70–99)

## 2022-08-23 LAB — HEMOGLOBIN A1C
Hgb A1c MFr Bld: 9.4 % — ABNORMAL HIGH (ref 4.8–5.6)
Mean Plasma Glucose: 223.08 mg/dL

## 2022-08-23 NOTE — Progress Notes (Addendum)
Anesthesia Review:  PCP: DR Hyman Hopes Patel-LOV 05/26/22  Cardiologist :Hoy Morn [facility] 05/18/22  DR Glenetta Hew Cardoilogist- [facility] 11/23/21  Chest x-ray : 11/08/21- 2 view  EKG :05/18/22  Monitor- 10/20/21  Echo : 02/18/21  Stress test: 2019  Cardiac Cath :2017   Activity level: can do a flight of stairs without difficutly  Sleep Study/ CPAP : Fasting Blood Sugar :      / Checks Blood Sugar -- times a day:   Blood Thinner/ Instructions /Last Dose: ASA / Instructions/ Last Dose :   81 mg Aspirin  DM- type 2 Hgba1c- 08/23/22- 9.4 - routed to DR pace on 08/23/2022.  Dexcom monitor  Dialysis access in right upper arm  Hemodialysis- M-W-f - [address]  On dialysis since 2018  32 ounce fluid restriction  CBC done 08/23/22 routed to DR pace.

## 2022-08-24 ENCOUNTER — Other Ambulatory Visit (HOSPITAL_COMMUNITY): Payer: Self-pay

## 2022-08-24 ENCOUNTER — Ambulatory Visit (HOSPITAL_BASED_OUTPATIENT_CLINIC_OR_DEPARTMENT_OTHER): Payer: 59

## 2022-08-24 NOTE — Progress Notes (Signed)
Anesthesia Chart Review   Case: [id number] Date/Time: 08/30/22 0830   Procedure: CYSTOSCOPY WITH FULGERATION, BIOPSY   Anesthesia type: General   Pre-op diagnosis: BLADDER LESION   Location: [place] / [facility]   Surgeons: Robley Fries, MD       DISCUSSION:46 y.o. former smoker PONV, HTN, CAD (BMS PCi to prox LAD in setting of NSTEMI in 2017), chronic diastolic heart failure, DM insulin dependent, ESRD on dialysis MWF, bladder lesion scheduled for above procedure 08/30/2022 with Dr. Jacalyn Lefevre.   Pt last seen by cardiology 05/18/2022. Per OV note stable at this visit, 6 month follow up at this visit.   Evaluate volume status DOS.  VS: BP (!) 144/79   Pulse 86   Temp 37.1 C (Oral)   Resp 16   Ht $R'5\' 5"'OR$  (1.651 m)   SpO2 100%   BMI 36.68 kg/m   PROVIDERS: Linus Galas, MD is PCP   Glenetta Hew, MD is Cardiologist  LABS: Labs reviewed: Acceptable for surgery. (all labs ordered are listed, but only abnormal results are displayed)  Labs Reviewed  HEMOGLOBIN A1C - Abnormal; Notable for the following components:      Result Value   Hgb A1c MFr Bld 9.4 (*)    All other components within normal limits  CBC - Abnormal; Notable for the following components:   Hemoglobin 10.9 (*)    HCT 35.7 (*)    RDW 16.7 (*)    All other components within normal limits  GLUCOSE, CAPILLARY - Abnormal; Notable for the following components:   Glucose-Capillary 168 (*)    All other components within normal limits     IMAGES:   EKG:   CV: Echo 02/18/2021  1. Mild septal hypertrophy with moderate posterior hypertrophy. Left  ventricular ejection fraction, by estimation, is 55 to 60%. The left  ventricle has normal function. The left ventricle has no regional wall  motion abnormalities. There is mild left  ventricular hypertrophy. Left ventricular diastolic parameters are  indeterminate. The average left ventricular global longitudinal strain is  -21.0 %. The  global longitudinal strain is normal.   2. Right ventricular systolic function is normal. The right ventricular  size is normal. There is normal pulmonary artery systolic pressure.   3. The mitral valve is normal in structure. Trivial mitral valve  regurgitation. No evidence of mitral stenosis.   4. The aortic valve is tricuspid. Aortic valve regurgitation is not  visualized. No aortic stenosis is present.   5. The inferior vena cava is normal in size with greater than 50%  respiratory variability, suggesting right atrial pressure of 3 mmHg.   Myocardial Perfusion 09/13/2018   The left ventricular ejection fraction is normal (55-65%). Nuclear stress EF: 56%. There was no ST segment deviation noted during stress. No T wave inversion was noted during stress. The study is normal. This is a low risk study.   Low risk stress nuclear study with normal perfusion and normal left ventricular regional and global systolic function.   Past Medical History:  Diagnosis Date   Abnormal uterine bleeding (AUB)    Arthritis of knee    Left, Gel and cortisone injections @ [facility]   Asthma    prn inhaler   CAD S/P BMS PCI to prox LAD cardiologist--- dr Beverlyn Roux LAD to Mid LAD lesion, 90% stenosed. Post intervention - Vision BMS 3.0 mm x 18 mm (~3.5 mm) there is a 0% residual stenosis. ;   nuclear  stress test-- 09-13-2018 low risk with no ischemia, nuclear ef 56%   Charcot foot due to diabetes mellitus ([place]) 11/2016   left 2018; right 2019   Depression    Diabetic foot ulcer ([place]) 04/22/2019   Diabetic neuropathy (HCC)    feet   Diabetic retinopathy, nonproliferative, severe ([place])    bilateral   Dyspnea    ESRD (end stage renal disease) on dialysis [facility])    ?chronic interstitial nephritis  ([facility]   H/O non-ST elevation myocardial infarction (NSTEMI) 03/2016   Found in 90% mid LAD lesion treated with bare-metal stent (BMS) PCI - vision BMS 3.0 mm x 18 mm   Heart  murmur    History of MRSA infection 2007   Hyperlipidemia    Hypertension    IDA (iron deficiency anemia)    Insulin dependent type 2 diabetes mellitus (HCC)    Iron deficiency anemia    takes iron supplement   Myocardial infarction [facility])    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 ([place]) 05/23/2018   PAOD (peripheral arterial occlusive disease) ([place])    vascular--- dr Bridgett Larsson   Pneumonia    PONV (postoperative nausea and vomiting)    S/P arteriovenous (AV) fistula creation 07/2017   S/p bare metal coronary artery stent 03/31/2016   BMS x1  to pLAD   Sickle cell trait ([place])    Ulcer of left foot due to type 2 diabetes mellitus ([place]) 06/03/2019    Past Surgical History:  Procedure Laterality Date   A/V FISTULAGRAM N/A 04/13/2021   Procedure: A/V FISTULAGRAM - Right Arm;  Surgeon: Serafina Mitchell, MD;  Location: [place] CV LAB;  Service: Cardiovascular;  Laterality: N/A;   ACHILLES TENDON LENGTHENING Left 11/24/2016   Procedure: Left Achilles tendon lengthening (open);  Surgeon: Wylene Simmer, MD;  Location: [place];  Service: Orthopedics;  Laterality: Left;   AV FISTULA PLACEMENT Right 07/31/2017   Procedure: ARTERIOVENOUS BRACHIOCEPHALIC FISTULA CREATION RIGHT ARM;  Surgeon: Conrad [place], MD;  Location: [facility];  Service: Vascular;  Laterality: Right;   CALCANEAL OSTEOTOMY Left 11/24/2016   Procedure: Left hindfoot osteotomy and fusion;  Surgeon: Wylene Simmer, MD;  Location: [place];  Service: Orthopedics;  Laterality: Left;   CARDIAC CATHETERIZATION N/A 03/31/2016   Procedure: Left Heart Cath and Coronary Angiography;  Surgeon: Lorretta Harp, MD;  Location: [facility] INVASIVE CV LAB: 90% early mLAD, normal LV Fxn   CARDIAC CATHETERIZATION N/A 03/31/2016   Procedure: Coronary Stent Intervention;  Surgeon: Lorretta Harp, MD;  Location: [place] CV LAB;  Service: Cardiovascular: PCI to mLAD BMS Vision 3.0 mm x 18 mm   CESAREAN [place] N/A 09/15/2020   Procedure: DILATATION & CURETTAGE/HYSTEROSCOPY;  Surgeon: Lavonia Drafts, MD;  Location: [place];  Service: Gynecology;  Laterality: N/A;   DILITATION & CURRETTAGE/HYSTROSCOPY WITH NOVASURE ABLATION N/A 12/21/2021   Procedure: [place] ABLATION/ IUD REMOVAL;  Surgeon: Lavonia Drafts, MD;  Location: [place];  Service: Gynecology;  Laterality: N/A;   FISTULA SUPERFICIALIZATION Right 11/15/2017   Procedure: FISTULA SUPERFICIALIZATION RIGHT BRACHIOCEPHALIC  RIGHT;  Surgeon: Conrad Mower, MD;  Location: [place];  Service: Vascular;  Laterality: Right;   FOOT SURGERY     multiple for charcot   PERIPHERAL VASCULAR BALLOON ANGIOPLASTY Right 04/13/2021   Procedure: PERIPHERAL VASCULAR BALLOON ANGIOPLASTY;  Surgeon: Serafina Mitchell, MD;  Location: [place] CV LAB;  Service: Cardiovascular;  Laterality: Right;  arm fistula   TRANSTHORACIC ECHOCARDIOGRAM  02/2021   EF 55 to 60%.  No R WMA.  Mild septal LVH with moderate posterior hypertrophy.  Normal RV size and function.  Normal RVP/PASP.Marland Kitchen  Trivial MR.   TUBAL LIGATION  1997    MEDICATIONS:  Accu-Chek Softclix Lancets lancets   glucose blood (ACCU-CHEK GUIDE) test strip   acetaminophen (TYLENOL) 500 MG tablet   albuterol (PROVENTIL HFA) 108 (90 Base) MCG/ACT inhaler   aspirin EC 81 MG tablet   benzonatate (TESSALON PERLES) 100 MG capsule   blood glucose meter kit and supplies   Blood Glucose Monitoring Suppl (ACCU-CHEK GUIDE) w/Device KIT   cetirizine (ZYRTEC) 10 MG tablet   cetirizine (ZYRTEC) 5 MG tablet   Cholecalciferol (VITAMIN D3) 50 MCG (2000 UT) CAPS   Continuous Blood Gluc Receiver (DEXCOM G6 RECEIVER) DEVI   Continuous Blood Gluc Sensor (DEXCOM G6 SENSOR) MISC   Continuous Blood Gluc Transmit (DEXCOM G6 TRANSMITTER) MISC   fluticasone (FLONASE) 50 MCG/ACT  nasal spray   gabapentin (NEURONTIN) 100 MG capsule   guanFACINE (INTUNIV) 1 MG TB24 ER tablet   ibuprofen (ADVIL) 800 MG tablet   icosapent Ethyl (VASCEPA) 1 g capsule   injection device for insulin (INPEN 100-PINK-LILLY-HUMALOG) DEVI   insulin degludec (TRESIBA) 100 UNIT/ML FlexTouch Pen   insulin lispro (HUMALOG) 100 UNIT/ML cartridge   Insulin Pen Needle 32G X 4 MM MISC   Insulin Syringe-Needle U-100 31G X 15/64" 0.3 ML MISC   lanthanum (FOSRENOL) 1000 MG chewable tablet   megestrol (MEGACE) 40 MG tablet   metoCLOPramide (REGLAN) 5 MG tablet   metoprolol tartrate (LOPRESSOR) 25 MG tablet   multivitamin (RENA-VIT) TABS tablet   oxyCODONE-acetaminophen (PERCOCET) 10-325 MG tablet   oxyCODONE-acetaminophen (PERCOCET) 10-325 MG tablet   oxyCODONE-acetaminophen (PERCOCET) 10-325 MG tablet   polyethylene glycol (MIRALAX / GLYCOLAX) 17 g packet   rosuvastatin (CRESTOR) 40 MG tablet   Semaglutide,0.25 or 0.5MG /DOS, (OZEMPIC, 0.25 OR 0.5 MG/DOSE,) 2 MG/1.5ML SOPN   senna-docusate (SENOKOT-S) 8.6-50 MG tablet   sertraline (ZOLOFT) 25 MG tablet   sevelamer carbonate (RENVELA) 800 MG tablet   Sodium Fluoride 1.1 % PSTE    polyethylene glycol powder (GLYCOLAX/MIRALAX) container 255 g     [facility] Ward, PA-C [facility] Pre-Surgical Testing [phone]

## 2022-08-24 NOTE — Anesthesia Preprocedure Evaluation (Addendum)
Anesthesia Evaluation  Patient identified by MRN, date of birth, ID band Patient awake    Reviewed: Allergy & Precautions, NPO status , Patient's Chart, lab work & pertinent test results  History of Anesthesia Complications (+) PONV and history of anesthetic complications  Airway Mallampati: II  TM Distance: >3 FB Neck ROM: Full    Dental no notable dental hx.    Pulmonary shortness of breath, asthma , former smoker,    Pulmonary exam normal breath sounds clear to auscultation       Cardiovascular hypertension, Pt. on medications + CAD, + Past MI, + Peripheral Vascular Disease and +CHF  Normal cardiovascular exam Rhythm:Regular Rate:Normal     Neuro/Psych Depression  Neuromuscular disease negative psych ROS   GI/Hepatic negative GI ROS, Neg liver ROS,   Endo/Other  negative endocrine ROSdiabetes  Renal/GU ESRF and DialysisRenal disease  negative genitourinary   Musculoskeletal  (+) Arthritis , Osteoarthritis,    Abdominal (+) + obese,   Peds negative pediatric ROS (+)  Hematology  (+) Blood dyscrasia, anemia ,   Anesthesia Other Findings   Reproductive/Obstetrics negative OB ROS                            Anesthesia Physical Anesthesia Plan  ASA: 4  Anesthesia Plan: General   Post-op Pain Management:    Induction: Intravenous  PONV Risk Score and Plan: 4 or greater and Ondansetron, Dexamethasone, Midazolam, Droperidol and Treatment may vary due to age or medical condition  Airway Management Planned: LMA  Additional Equipment:   Intra-op Plan:   Post-operative Plan: Extubation in OR  Informed Consent: I have reviewed the patients History and Physical, chart, labs and discussed the procedure including the risks, benefits and alternatives for the proposed anesthesia with the patient or authorized representative who has indicated his/her understanding and acceptance.      Dental advisory given  Plan Discussed with: CRNA  Anesthesia Plan Comments: (See PAT note 08/23/2022)       Anesthesia Quick Evaluation

## 2022-08-25 ENCOUNTER — Other Ambulatory Visit (HOSPITAL_COMMUNITY): Payer: Self-pay

## 2022-08-25 NOTE — H&P (Signed)
CC/HPI: cc: Gross hematuria, left flank pain   06/15/2022: 46 year old woman with a history of end-stage renal disease on hemodialysis Monday Wednesday Friday comes in with several week history of left flank pain and gross hematuria. She has had a previous renal ultrasound and CT scan approximately 2 weeks ago without evidence of hydronephrosis. The pain has persisted as has the hematuria. She makes approximately 1 cup of urine a day. No fevers, chills, nausea or vomiting.   07/29/2022: Here for cystoscopy as part of gross hematuria work-up. Noncontrast CT scan did not show source of hematuria.     ALLERGIES: Adhesive tape Paper Tape Plastic Tape seasonal allergies    MEDICATIONS: Metoprolol Tartrate 25 mg tablet  Zyrtec  Aspirin Ec 81 mg tablet, delayed release  Benzonatate 100 mg capsule  Blood Glucose Meter  Dexcom G6 Receiver  Flonase Allergy Relief  Fosrenol 1,000 mg tablet,chewable  Gabapentin 100 mg capsule  Ibuprofen 800 mg tablet  Inpen (For Humalog) insulin pen  Intuniv 1 mg tablet, extended release 24 hr  Megestrol Acetate 40 mg tablet  Multivitamin  Oxycodone-Acetaminophen 10 mg-325 mg tablet  Ozempic 0.25 mg or 0.5 mg dose (2 mg/1.5 ml) pen injector  Proventil Hfa 90 mcg hfa aerosol with adapter  Renvela 800 mg tablet  Rosuvastatin Calcium 40 mg tablet  Sertraline Hcl 25 mg tablet  Tylenol Extra Strength 500 mg tablet  Vascepa 1 gram capsule  Vitamin D3     GU PSH: No GU PSH      PSH Notes: Fistula 2018.   NON-GU PSH: Bilateral Tubal Ligation Cesarean Delivery Heart Surgery (Unspecified)     GU PMH: Flank Pain - 06/15/2022 Gross hematuria - 06/15/2022 Kidney Failure Unspec      PMH Notes: Diabetes.   NON-GU PMH: Anxiety Arthritis Asthma Cardiac murmur, unspecified Depression Heart disease, unspecified Hypercholesterolemia Hypertension Myocardial Infarction    FAMILY HISTORY: 1 Daughter - Runs in Family 1 son - Runs in Family Heart problem -  Uncle, Mother Hypertension - Father, Sister, Cousin, Mother Kidney Failure - Runs in Family Kidney Stones - Runs in Family Seizure - Mother stroke - Mother   SOCIAL HISTORY: Marital Status: Single Preferred Language: English; Ethnicity: Not Hispanic Or Latino; Race: Black or African American Current Smoking Status: Patient does not smoke anymore.   Tobacco Use Assessment Completed: Used Tobacco in last 30 days? Has never drank.  Drinks 1 caffeinated drink per day.    REVIEW OF SYSTEMS:    GU Review Female:   Patient denies frequent urination, hard to postpone urination, burning /pain with urination, get up at night to urinate, leakage of urine, stream starts and stops, trouble starting your stream, have to strain to urinate, and being pregnant.  Gastrointestinal (Upper):   Patient denies nausea, indigestion/ heartburn, and vomiting.  Gastrointestinal (Lower):   Patient denies diarrhea and constipation.  Constitutional:   Patient denies fever, night sweats, weight loss, and fatigue.  Skin:   Patient denies skin rash/ lesion and itching.  Eyes:   Patient denies blurred vision and double vision.  Ears/ Nose/ Throat:   Patient denies sore throat and sinus problems.  Hematologic/Lymphatic:   Patient denies swollen glands and easy bruising.  Cardiovascular:   Patient denies leg swelling and chest pains.  Respiratory:   Patient denies cough and shortness of breath.  Endocrine:   Patient denies excessive thirst.  Musculoskeletal:   Patient denies back pain and joint pain.  Neurological:   Patient denies headaches and dizziness.  Psychologic:  Patient denies depression and anxiety.   VITAL SIGNS: None   MULTI-SYSTEM PHYSICAL EXAMINATION:    Constitutional: Well-nourished. No physical deformities. Normally developed. Good grooming.  Neck: Neck symmetrical, not swollen. Normal tracheal position.  Respiratory: No labored breathing, no use of accessory muscles.   Skin: No paleness, no  jaundice, no cyanosis. No lesion, no ulcer, no rash.  Neurologic / Psychiatric: Oriented to time, oriented to place, oriented to person. No depression, no anxiety, no agitation.  Eyes: Normal conjunctivae. Normal eyelids.  Ears, Nose, Mouth, and Throat: Left ear no scars, no lesions, no masses. Right ear no scars, no lesions, no masses. Nose no scars, no lesions, no masses. Normal hearing. Normal lips.  Musculoskeletal: Normal gait and station of head and neck.     Complexity of Data:  Records Review:   Previous Patient Records, POC Tool  Urine Test Review:   Urinalysis   PROCEDURES:         Flexible Cystoscopy - 52000  Risks, benefits, and some of the potential complications of the procedure were discussed at length with the patient including infection, bleeding, voiding discomfort, urinary retention, fever, chills, sepsis, and others. All questions were answered. Informed consent was obtained. Antibiotic prophylaxis was given. Sterile technique and intraurethral analgesia were used.  Meatus:  Normal size. Normal location. Normal condition.  Urethra:  No hypermobility. No leakage.  Ureteral Orifices:  Not clearly seen, no efflux  Bladder:  No trabeculation. No tumors. Focal area of scattered erythema on posterior inferior bladder wall no stones.      The lower urinary tract was carefully examined. The procedure was well-tolerated and without complications. Antibiotic instructions were given. Instructions were given to call the office immediately for bloody urine, difficulty urinating, urinary retention, painful or frequent urination, fever, chills, nausea, vomiting or other illness. The patient stated that she understood these instructions and would comply with them.         Urinalysis w/Scope Dipstick Dipstick Cont'd Micro  Color: Straw Bilirubin: Neg mg/dL WBC/hpf: NS (Not Seen)  Appearance: Clear Ketones: Neg mg/dL RBC/hpf: NS (Not Seen)  Specific Gravity: <=1.005 Blood: 2+ ery/uL  Bacteria: NS (Not Seen)  pH: <=5.0 Protein: Neg mg/dL Cystals: NS (Not Seen)  Glucose: Neg mg/dL Urobilinogen: 0.2 mg/dL Casts: NS (Not Seen)    Nitrites: Neg Trichomonas: Not Present    Leukocyte Esterase: Neg leu/uL Mucous: Not Present      Epithelial Cells: NS (Not Seen)      Yeast: NS (Not Seen)      Sperm: Not Present    Notes: colorless urine    ASSESSMENT:      ICD-10 Details  1 GU:   Gross hematuria - R31.0 Undiagnosed New Problem  2   Bladder tumor/neoplasm - D41.4 Undiagnosed New Problem   PLAN:           Document Letter(s):  Created for Patient: Clinical Summary         Notes:   Bladder lesion:  -Patient with abnormal mucosa on the posterior wall concerning for chronic cystitis versus possible neoplasm  -Risks and benefits of cystoscopy with bladder biopsy and fulguration discussed with the patient in detail including but not limited to pain, bleeding, infection, damage to surrounding structures, bladder perforation, dysuria, need for additional treatment or intervention  -Patient understands and will be scheduled for the next available date will to be coordinated with dialysis days

## 2022-08-26 ENCOUNTER — Encounter (HOSPITAL_COMMUNITY): Payer: Self-pay

## 2022-08-26 ENCOUNTER — Other Ambulatory Visit (HOSPITAL_COMMUNITY): Payer: Self-pay

## 2022-08-26 ENCOUNTER — Ambulatory Visit (INDEPENDENT_AMBULATORY_CARE_PROVIDER_SITE_OTHER): Payer: 59 | Admitting: Internal Medicine

## 2022-08-26 DIAGNOSIS — R051 Acute cough: Secondary | ICD-10-CM

## 2022-08-26 MED ORDER — BENZONATATE 100 MG PO CAPS
200.0000 mg | ORAL_CAPSULE | Freq: Three times a day (TID) | ORAL | 0 refills | Status: DC | PRN
  Filled 2022-08-26: qty 20, 4d supply, fill #0

## 2022-08-26 NOTE — Progress Notes (Signed)
[facility] Internal Medicine Residency Telephone Encounter Continuity Care Appointment  HPI:  This telephone encounter was created for [name] on 08/26/2022 for the following purpose/cc: acute cough.   Past Medical History:  Past Medical History:  Diagnosis Date   Abnormal uterine bleeding (AUB)    Arthritis of knee    Left, Gel and cortisone injections @ [facility]   Asthma    prn inhaler   CAD S/P BMS PCI to prox LAD cardiologist--- dr Beverlyn Roux LAD to Mid LAD lesion, 90% stenosed. Post intervention - Vision BMS 3.0 mm x 18 mm (~3.5 mm) there is a 0% residual stenosis. ;   nuclear stress test-- 09-13-2018 low risk with no ischemia, nuclear ef 56%   Charcot foot due to diabetes mellitus ([place]) 11/2016   left 2018; right 2019   Depression    Diabetic foot ulcer ([place]) 04/22/2019   Diabetic neuropathy (HCC)    feet   Diabetic retinopathy, nonproliferative, severe ([place])    bilateral   Dyspnea    ESRD (end stage renal disease) on dialysis [facility])    ?chronic interstitial nephritis  ([facility]   H/O non-ST elevation myocardial infarction (NSTEMI) 03/2016   Found in 90% mid LAD lesion treated with bare-metal stent (BMS) PCI - vision BMS 3.0 mm x 18 mm   Heart murmur    History of MRSA infection 2007   Hyperlipidemia    Hypertension    IDA (iron deficiency anemia)    Insulin dependent type 2 diabetes mellitus (HCC)    Iron deficiency anemia    takes iron supplement   Myocardial infarction [facility])    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   Assessment / Plan / Recommendations:  Acute cough Jane [name] complains of 2 days of constant dry cough. She notes that it began yesterday and she admits to missing hemodialysis on Wednesday because she overslept and is worried that her symptoms could be related to missing that session. She denies significant edema at this time and is actually taking the Telehealth call from her hemodialysis bed. She denies fever, chills, chest congestion, sinus congestion. She does have some throat irritation from the cough. She has had 2 COVID-19 vaccines previously and has not had her annual flu vaccine yet. She denies sick contacts. Since starting hemodialysis today her cough hasn't been quite as pad. Plan:Prescription sent for Tessalon Perles, 100 mg TID PRN cough. Patient advised that the maximum dose per day of this medication is 600 mg/day. I encouraged her to also use throat lozenges for her throat irritation.   Please see A&P under problem oriented charting for assessment of the patient's acute and chronic medical conditions.  As always, pt is advised that if symptoms worsen or new symptoms arise, they should go to an urgent care facility or to to ER for further evaluation.   Consent and Medical Decision Making:  Patient discussed with Dr. Evette Doffing This is a telephone encounter between Jane [name]  and Jane [name] on 08/26/2022 for acute cough. The visit was conducted with the patient located at  hemodialysis  and Jane [name] at [facility]. The patient's identity was confirmed using their DOB and current address. The patient has consented to being evaluated through a telephone encounter and understands the associated risks (an examination cannot be done and the patient may need to come in for an appointment) / benefits (allows the patient to remain at home, decreasing exposure to coronavirus). I personally spent 15 minutes on medical discussion.

## 2022-08-26 NOTE — Assessment & Plan Note (Signed)
Jane [name] complains of 2 days of constant dry cough. She notes that it began yesterday and she admits to missing hemodialysis on Wednesday because she overslept and is worried that her symptoms could be related to missing that session. She denies significant edema at this time and is actually taking the Telehealth call from her hemodialysis bed. She denies fever, chills, chest congestion, sinus congestion. She does have some throat irritation from the cough. She has had 2 COVID-19 vaccines previously and has not had her annual flu vaccine yet. She denies sick contacts. Since starting hemodialysis today her cough hasn't been quite as pad. Plan:Prescription sent for Tessalon Perles, 100 mg TID PRN cough. Patient advised that the maximum dose per day of this medication is 600 mg/day. I encouraged her to also use throat lozenges for her throat irritation.

## 2022-08-29 ENCOUNTER — Encounter (HOSPITAL_BASED_OUTPATIENT_CLINIC_OR_DEPARTMENT_OTHER): Payer: Self-pay

## 2022-08-29 ENCOUNTER — Ambulatory Visit (HOSPITAL_BASED_OUTPATIENT_CLINIC_OR_DEPARTMENT_OTHER)
Admission: RE | Admit: 2022-08-29 | Discharge: 2022-08-29 | Disposition: A | Discharge status: Home / Self Care | Payer: 59 | Source: Ambulatory Visit | Attending: Obstetrics & Gynecology | Admitting: Obstetrics & Gynecology

## 2022-08-29 DIAGNOSIS — Z1231 Encounter for screening mammogram for malignant neoplasm of breast: Secondary | ICD-10-CM | POA: Diagnosis present

## 2022-08-29 NOTE — Progress Notes (Signed)
Internal Medicine Clinic Attending  Case discussed with Dr. Marlou Sa  At the time of the visit.  We reviewed the resident's history and pertinent patient test results.  I agree with the assessment, diagnosis, and plan of care documented in the resident's note.

## 2022-08-29 NOTE — Addendum Note (Signed)
Addended by: Lalla Brothers T on: 08/29/2022 08:16 AM   Modules accepted: Level of Service

## 2022-08-30 ENCOUNTER — Ambulatory Visit (HOSPITAL_COMMUNITY): Payer: 59

## 2022-08-30 ENCOUNTER — Ambulatory Visit (HOSPITAL_BASED_OUTPATIENT_CLINIC_OR_DEPARTMENT_OTHER): Payer: 59 | Admitting: Anesthesiology

## 2022-08-30 ENCOUNTER — Ambulatory Visit (HOSPITAL_COMMUNITY)
Admission: RE | Admit: 2022-08-30 | Discharge: 2022-08-30 | Disposition: A | Discharge status: Home / Self Care | Payer: 59 | Attending: Urology | Admitting: Urology

## 2022-08-30 ENCOUNTER — Encounter (HOSPITAL_COMMUNITY)
Admission: RE | Disposition: A | Discharge status: Home / Self Care | Payer: Self-pay | Source: Home / Self Care | Attending: Urology

## 2022-08-30 ENCOUNTER — Ambulatory Visit (HOSPITAL_COMMUNITY): Payer: 59 | Admitting: Physician Assistant

## 2022-08-30 ENCOUNTER — Encounter (HOSPITAL_COMMUNITY): Payer: Self-pay | Admitting: Urology

## 2022-08-30 DIAGNOSIS — E1151 Type 2 diabetes mellitus with diabetic peripheral angiopathy without gangrene: Secondary | ICD-10-CM | POA: Diagnosis not present

## 2022-08-30 DIAGNOSIS — Z992 Dependence on renal dialysis: Secondary | ICD-10-CM | POA: Insufficient documentation

## 2022-08-30 DIAGNOSIS — R310 Gross hematuria: Secondary | ICD-10-CM | POA: Diagnosis not present

## 2022-08-30 DIAGNOSIS — I509 Heart failure, unspecified: Secondary | ICD-10-CM | POA: Insufficient documentation

## 2022-08-30 DIAGNOSIS — I132 Hypertensive heart and chronic kidney disease with heart failure and with stage 5 chronic kidney disease, or end stage renal disease: Secondary | ICD-10-CM | POA: Diagnosis not present

## 2022-08-30 DIAGNOSIS — E669 Obesity, unspecified: Secondary | ICD-10-CM | POA: Diagnosis not present

## 2022-08-30 DIAGNOSIS — N3289 Other specified disorders of bladder: Secondary | ICD-10-CM

## 2022-08-30 DIAGNOSIS — I252 Old myocardial infarction: Secondary | ICD-10-CM | POA: Insufficient documentation

## 2022-08-30 DIAGNOSIS — N186 End stage renal disease: Secondary | ICD-10-CM | POA: Insufficient documentation

## 2022-08-30 DIAGNOSIS — E1122 Type 2 diabetes mellitus with diabetic chronic kidney disease: Secondary | ICD-10-CM | POA: Insufficient documentation

## 2022-08-30 DIAGNOSIS — N329 Bladder disorder, unspecified: Secondary | ICD-10-CM | POA: Insufficient documentation

## 2022-08-30 DIAGNOSIS — I2510 Atherosclerotic heart disease of native coronary artery without angina pectoris: Secondary | ICD-10-CM | POA: Insufficient documentation

## 2022-08-30 DIAGNOSIS — Z6836 Body mass index (BMI) 36.0-36.9, adult: Secondary | ICD-10-CM

## 2022-08-30 DIAGNOSIS — Z794 Long term (current) use of insulin: Secondary | ICD-10-CM

## 2022-08-30 HISTORY — PX: CYSTOSCOPY WITH FULGERATION: SHX6638

## 2022-08-30 LAB — BASIC METABOLIC PANEL
Anion gap: 13 (ref 5–15)
BUN: 41 mg/dL — ABNORMAL HIGH (ref 6–20)
CO2: 27 mmol/L (ref 22–32)
Calcium: 9.8 mg/dL (ref 8.9–10.3)
Chloride: 98 mmol/L (ref 98–111)
Creatinine, Ser: 11.11 mg/dL — ABNORMAL HIGH (ref 0.44–1.00)
GFR, Estimated: 4 mL/min — ABNORMAL LOW (ref >60)
Glucose, Bld: 265 mg/dL — ABNORMAL HIGH (ref 70–99)
Potassium: 4.4 mmol/L (ref 3.5–5.1)
Sodium: 138 mmol/L (ref 135–145)

## 2022-08-30 LAB — GLUCOSE, CAPILLARY
Glucose-Capillary: 250 mg/dL — ABNORMAL HIGH (ref 70–99)
Glucose-Capillary: 240 mg/dL — ABNORMAL HIGH (ref 70–99)

## 2022-08-30 LAB — CBC WITH DIFFERENTIAL/PLATELET
Abs Immature Granulocytes: 0.01 10*3/uL (ref 0.00–0.07)
Basophils Absolute: 0 10*3/uL (ref 0.0–0.1)
Basophils Relative: 1 %
Eosinophils Absolute: 0.2 10*3/uL (ref 0.0–0.5)
Eosinophils Relative: 4 %
HCT: 32.8 % — ABNORMAL LOW (ref 36.0–46.0)
Hemoglobin: 10.2 g/dL — ABNORMAL LOW (ref 12.0–15.0)
Immature Granulocytes: 0 %
Lymphocytes Relative: 26 %
Lymphs Abs: 1.5 10*3/uL (ref 0.7–4.0)
MCH: 27.3 pg (ref 26.0–34.0)
MCHC: 31.1 g/dL (ref 30.0–36.0)
MCV: 87.9 fL (ref 80.0–100.0)
Monocytes Absolute: 0.6 10*3/uL (ref 0.1–1.0)
Monocytes Relative: 11 %
Neutro Abs: 3.3 10*3/uL (ref 1.7–7.7)
Neutrophils Relative %: 58 %
Platelets: 257 10*3/uL (ref 150–400)
RBC: 3.73 MIL/uL — ABNORMAL LOW (ref 3.87–5.11)
RDW: 15.9 % — ABNORMAL HIGH (ref 11.5–15.5)
WBC: 5.6 10*3/uL (ref 4.0–10.5)
nRBC: 0 % (ref 0.0–0.2)

## 2022-08-30 LAB — HCG, SERUM, QUALITATIVE: Preg, Serum: NEGATIVE

## 2022-08-30 SURGERY — CYSTOSCOPY, WITH BLADDER FULGURATION
Anesthesia: General | Site: Urethra

## 2022-08-30 MED ORDER — MIDAZOLAM HCL 2 MG/2ML IJ SOLN
INTRAMUSCULAR | Status: AC
  Filled 2022-08-30: qty 2

## 2022-08-30 MED ORDER — PHENYLEPHRINE 80 MCG/ML (10ML) SYRINGE FOR IV PUSH (FOR BLOOD PRESSURE SUPPORT)
PREFILLED_SYRINGE | INTRAVENOUS | Status: DC | PRN
  Administered 2022-08-30: 160 ug via INTRAVENOUS

## 2022-08-30 MED ORDER — CHLORHEXIDINE GLUCONATE 0.12 % MT SOLN
15.0000 mL | Freq: Once | OROMUCOSAL | Status: AC
  Administered 2022-08-30: 15 mL via OROMUCOSAL

## 2022-08-30 MED ORDER — LACTATED RINGERS IV SOLN: INTRAVENOUS | Status: DC

## 2022-08-30 MED ORDER — ONDANSETRON HCL 4 MG/2ML IJ SOLN
INTRAMUSCULAR | Status: AC
  Filled 2022-08-30: qty 2

## 2022-08-30 MED ORDER — SODIUM CHLORIDE 0.9 % IV SOLN: INTRAVENOUS | Status: DC

## 2022-08-30 MED ORDER — OXYCODONE HCL 5 MG PO TABS: 5.0000 mg | ORAL_TABLET | Freq: Once | ORAL | Status: DC | PRN

## 2022-08-30 MED ORDER — PROPOFOL 10 MG/ML IV BOLUS
INTRAVENOUS | Status: AC
  Filled 2022-08-30: qty 20

## 2022-08-30 MED ORDER — LIDOCAINE HCL (PF) 2 % IJ SOLN
INTRAMUSCULAR | Status: AC
  Filled 2022-08-30: qty 5

## 2022-08-30 MED ORDER — 0.9 % SODIUM CHLORIDE (POUR BTL) OPTIME
TOPICAL | Status: DC | PRN
  Administered 2022-08-30: 1000 mL

## 2022-08-30 MED ORDER — ONDANSETRON HCL 4 MG/2ML IJ SOLN
INTRAMUSCULAR | Status: DC | PRN
  Administered 2022-08-30: 4 mg via INTRAVENOUS

## 2022-08-30 MED ORDER — IOHEXOL 300 MG/ML  SOLN
INTRAMUSCULAR | Status: DC | PRN
  Administered 2022-08-30: 8 mL

## 2022-08-30 MED ORDER — PHENYLEPHRINE 80 MCG/ML (10ML) SYRINGE FOR IV PUSH (FOR BLOOD PRESSURE SUPPORT)
PREFILLED_SYRINGE | INTRAVENOUS | Status: AC
  Filled 2022-08-30: qty 10

## 2022-08-30 MED ORDER — LIDOCAINE 2% (20 MG/ML) 5 ML SYRINGE
INTRAMUSCULAR | Status: DC | PRN
  Administered 2022-08-30: 40 mg via INTRAVENOUS
  Administered 2022-08-30: 60 mg via INTRAVENOUS

## 2022-08-30 MED ORDER — MIDAZOLAM HCL 5 MG/5ML IJ SOLN
INTRAMUSCULAR | Status: DC | PRN
  Administered 2022-08-30: 2 mg via INTRAVENOUS

## 2022-08-30 MED ORDER — FENTANYL CITRATE (PF) 100 MCG/2ML IJ SOLN
INTRAMUSCULAR | Status: DC | PRN
  Administered 2022-08-30 (×2): 25 ug via INTRAVENOUS

## 2022-08-30 MED ORDER — DEXAMETHASONE SODIUM PHOSPHATE 10 MG/ML IJ SOLN
INTRAMUSCULAR | Status: AC
  Filled 2022-08-30: qty 1

## 2022-08-30 MED ORDER — DEXAMETHASONE SODIUM PHOSPHATE 10 MG/ML IJ SOLN
INTRAMUSCULAR | Status: DC | PRN
  Administered 2022-08-30: 10 mg via INTRAVENOUS

## 2022-08-30 MED ORDER — HYDROMORPHONE HCL 1 MG/ML IJ SOLN
INTRAMUSCULAR | Status: AC
  Filled 2022-08-30: qty 1

## 2022-08-30 MED ORDER — OXYCODONE HCL 5 MG/5ML PO SOLN: 5.0000 mg | Freq: Once | ORAL | Status: DC | PRN

## 2022-08-30 MED ORDER — HYDROMORPHONE HCL 1 MG/ML IJ SOLN
0.2500 mg | INTRAMUSCULAR | Status: DC | PRN
  Administered 2022-08-30: 0.5 mg via INTRAVENOUS

## 2022-08-30 MED ORDER — PROPOFOL 10 MG/ML IV BOLUS
INTRAVENOUS | Status: DC | PRN
  Administered 2022-08-30: 100 mg via INTRAVENOUS
  Administered 2022-08-30: 200 mg via INTRAVENOUS

## 2022-08-30 MED ORDER — PROMETHAZINE HCL 25 MG/ML IJ SOLN: 6.2500 mg | INTRAMUSCULAR | Status: DC | PRN

## 2022-08-30 MED ORDER — CEFAZOLIN SODIUM-DEXTROSE 2-4 GM/100ML-% IV SOLN
2.0000 g | INTRAVENOUS | Status: AC
  Administered 2022-08-30: 2 g via INTRAVENOUS
  Filled 2022-08-30: qty 100

## 2022-08-30 MED ORDER — SODIUM CHLORIDE 0.9 % IR SOLN
Status: DC | PRN
  Administered 2022-08-30: 3000 mL

## 2022-08-30 MED ORDER — FENTANYL CITRATE (PF) 100 MCG/2ML IJ SOLN
INTRAMUSCULAR | Status: AC
  Filled 2022-08-30: qty 2

## 2022-08-30 SURGICAL SUPPLY — 16 items
BAG URINE DRAIN 2000ML AR STRL (UROLOGICAL SUPPLIES) IMPLANT
BAG URO CATCHER STRL LF (MISCELLANEOUS) ×1 IMPLANT
CATH URETHERAL OPEN END 7FR (CATHETERS) IMPLANT
CLOTH BEACON ORANGE TIMEOUT ST (SAFETY) ×1 IMPLANT
DRSG TELFA 3X8 NADH STRL (GAUZE/BANDAGES/DRESSINGS) IMPLANT
ELECT COAG BIPOLAR CYL 1.2MMM (ELECTROSURGICAL)
ELECT REM PT RETURN 15FT ADLT (MISCELLANEOUS) ×1 IMPLANT
ELECTRODE COAG BIPLR CYL 1.2MM (ELECTROSURGICAL) IMPLANT
GLOVE BIO SURGEON STRL SZ 6.5 (GLOVE) ×1 IMPLANT
GOWN STRL REUS W/ TWL LRG LVL3 (GOWN DISPOSABLE) ×1 IMPLANT
GOWN STRL REUS W/TWL LRG LVL3 (GOWN DISPOSABLE) ×1
KIT TURNOVER KIT A (KITS) IMPLANT
LOOP CUT BIPOLAR 24F LRG (ELECTROSURGICAL) IMPLANT
MANIFOLD NEPTUNE II (INSTRUMENTS) ×1 IMPLANT
PACK CYSTO (CUSTOM PROCEDURE TRAY) ×1 IMPLANT
TUBING CONNECTING 10 (TUBING) ×1 IMPLANT

## 2022-08-30 NOTE — Anesthesia Postprocedure Evaluation (Signed)
Anesthesia Post Note  Patient: Jane [name]  Procedure(s) Performed: CYSTOSCOPY WITH FULGERATION, BIOPSY, LEFT RETROGRADE PYELOGRAM (Urethra)     Patient location during evaluation: PACU Anesthesia Type: General Level of consciousness: awake and alert Pain management: pain level controlled Vital Signs Assessment: post-procedure vital signs reviewed and stable Respiratory status: spontaneous breathing, nonlabored ventilation and respiratory function stable Cardiovascular status: blood pressure returned to baseline and stable Postop Assessment: no apparent nausea or vomiting Anesthetic complications: no   No notable events documented.  Last Vitals:  Vitals:   08/30/22 1000 08/30/22 1010  BP: (!) 164/79 (!) 157/89  Pulse: 84 88  Resp: 15 18  Temp: 36.8 C 36.8 C  SpO2: 97% 100%    Last Pain:  Vitals:   08/30/22 1010  TempSrc:   PainSc: 0-No pain                 Lynda Rainwater

## 2022-08-30 NOTE — Discharge Instructions (Signed)
Post Bladder Surgery Instructions   General instructions:     Your recent bladder surgery requires very little post hospital care but some definite precautions.  Despite the fact that no skin incisions were used, the area around the bladder incisions are raw and covered with scabs to promote healing and prevent bleeding. Certain precautions are needed to insure that the scabs are not disturbed over the next 2-4 weeks while the healing proceeds.  Because the raw surface inside your bladder and the irritating effects of urine you may expect frequency of urination and/or urgency (a stronger desire to urinate) and perhaps even getting up at night more often. This will usually resolve or improve slowly over the healing period. You may see some blood in your urine over the first 6 weeks. Do not be alarmed, even if the urine was clear for a while. Get off your feet and drink lots of fluids until clearing occurs. If you start to pass clots or don't improve call us.  Catheter: (If you are discharged with a catheter.)  1. Keep your catheter secured to your leg at all times with tape or the supplied strap. 2. You may experience leakage of urine around your catheter- as long as the  catheter continues to drain, this is normal.  If your catheter stops draining  go to the ER. 3. You may also have blood in your urine, even after it has been clear for  several days; you may even pass some small blood clots or other material.  This  is normal as well.  If this happens, sit down and drink plenty of water to help  make urine to flush out your bladder.  If the blood in your urine becomes worse  after doing this, contact our office or return to the ER. 4. You may use the leg bag (small bag) during the day, but use the large bag at  night.  Diet:  You may return to your normal diet immediately. Because of the raw surface of your bladder, alcohol, spicy foods, foods high in acid and drinks with caffeine may  cause irritation or frequency and should be used in moderation. To keep your urine flowing freely and avoid constipation, drink plenty of fluids during the day (8-10 glasses). Tip: Avoid cranberry juice because it is very acidic.  Activity:  Your physical activity doesn't need to be restricted. However, if you are very active, you may see some blood in the urine. We suggest that you reduce your activity under the circumstances until the bleeding has stopped.  Bowels:  It is important to keep your bowels regular during the postoperative period. Straining with bowel movements can cause bleeding. A bowel movement every other day is reasonable. Use a mild laxative if needed, such as milk of magnesia 2-3 tablespoons, or 2 Dulcolax tablets. Call if you continue to have problems. If you had been taking narcotics for pain, before, during or after your surgery, you may be constipated. Take a laxative if necessary.    Medication:  You should resume your pre-surgery medications unless told not to. In addition you may be given an antibiotic to prevent or treat infection. Antibiotics are not always necessary. All medication should be taken as prescribed until the bottles are finished unless you are having an unusual reaction to one of the drugs.   

## 2022-08-30 NOTE — Interval H&P Note (Signed)
History and Physical Interval Note:  08/30/2022 8:09 AM  Jane [name]  has presented today for surgery, with the diagnosis of BLADDER LESION.  The various methods of treatment have been discussed with the patient and family. After consideration of risks, benefits and other options for treatment, the patient has consented to  Procedure(s): CYSTOSCOPY WITH FULGERATION, BIOPSY (N/A) as a surgical intervention.  The patient's history has been reviewed, patient examined, no change in status, stable for surgery.  I have reviewed the patient's chart and labs.  Questions were answered to the patient's satisfaction.     Dajuana Palen D Jailen Coward

## 2022-08-30 NOTE — Op Note (Signed)
Operative Note  Preoperative diagnosis:  1.  Gross hematuria 2.  Left lower quadrant pain 3.  Abnormal bladder mucosa  Postoperative diagnosis: 1.  Gross hematuria 2.  Left lower quadrant pain 3.  Abnormal bladder mucosa  Procedure(s): 1.  Cystoscopy with bladder biopsy 2.  Left retrograde pyelogram 3.  Exam under anesthesia  Surgeon: Jacalyn Lefevre, MD  Assistants:  None  Anesthesia:  General  Complications:  None  EBL:  none  Specimens: 1. Posterior wall bladder biopsy  Drains/Catheters: 1.  none  Intraoperative findings:   Exam under anesthesia showed blood in vaginal vault and cervix consistent with vaginal bleeding Normal urethra Bilateral orthotopic ureteral orifices Very subtle posterior wall erythema that was biopsied with cold cup bladder biopsy Left retrograde pyelogram without any filling defects along course of ureter or hydronephrosis  Indication:  Jane [name] is a 46 y.o. female with history of end-stage renal disease on hemodialysis with left lower quadrant pain and gross hematuria.  Cystoscopy did not reveal source of hematuria but there was abnormal bladder mucosa.  Noncontrast CT scan also did not show any hydronephrosis or calculi.  She continues to have left lower quadrant pain.  Description of procedure:  After risks and benefits of the procedure discussed with the patient informed consent was obtained.  The patient taken the operating placed in supine position.  Anesthesia was used antibiotics were ministered.  The patient was then repositioned in the dorsolithotomy position.  She was prepped and draped in usual sterile fashion timeout performed.  A speculum was placed in the vaginal introitus.  There was blood seen at the cervix as well as in the vaginal vault.  This is consistent with vaginal bleeding.  The speculum was removed.  A 21 French rigid cystoscope was placed in urethra meatus and advanced in the bladder and direct  visualization.  Findings noted above.  An open-ended ureteral catheter was then used to cannulate the left ureteral orifice.  A retrograde pyelogram was obtained which did not show any filling defects.  The open-ended ureteral catheter was removed.  Attention then turned to a subtle area of erythema in the posterior bladder mucosa.  A cold cup bladder biopsy was taken from this area.  It was then cauterized with bipolar electrocautery loop.  Hemostasis was deemed adequate with irrigant turned off.  The resectoscope was removed.  The patient emerged from anesthesia was transferred to PACU in stable condition.  Plan: Discharge home with follow-up to discuss biopsy results

## 2022-08-30 NOTE — Progress Notes (Signed)
Call to Dr. [name] Heck RE: CBG of 250. No new orders.

## 2022-08-30 NOTE — Anesthesia Procedure Notes (Signed)
Procedure Name: LMA Insertion Date/Time: 08/30/2022 8:45 AM  Performed by: Sharlette Dense, CRNAPatient Re-evaluated:Patient Re-evaluated prior to induction Oxygen Delivery Method: Circle system utilized Preoxygenation: Pre-oxygenation with 100% oxygen Induction Type: IV induction LMA: LMA inserted LMA Size: 4.0 Number of attempts: 1 Placement Confirmation: positive ETCO2 and breath sounds checked- equal and bilateral Tube secured with: Tape Dental Injury: Teeth and Oropharynx as per pre-operative assessment

## 2022-08-30 NOTE — Transfer of Care (Signed)
Immediate Anesthesia Transfer of Care Note  Patient: MESA JANUS  Procedure(s) Performed: CYSTOSCOPY WITH FULGERATION, BIOPSY, LEFT RETROGRADE PYELOGRAM (Urethra)  Patient Location: PACU  Anesthesia Type:General  Level of Consciousness: drowsy  Airway & Oxygen Therapy: Patient Spontanous Breathing and Patient connected to face mask oxygen  Post-op Assessment: Report given to RN and Post -op Vital signs reviewed and stable  Post vital signs: Reviewed and stable  Last Vitals:  Vitals Value Taken Time  BP 167/78 08/30/22 0925  Temp    Pulse 82 08/30/22 0926  Resp 28 08/30/22 0926  SpO2 100 % 08/30/22 0926  Vitals shown include unvalidated device data.  Last Pain:  Vitals:   08/30/22 0740  TempSrc:   PainSc: 6       Patients Stated Pain Goal: 5 (82/95/62 1308)  Complications: No notable events documented.

## 2022-08-31 ENCOUNTER — Encounter (HOSPITAL_COMMUNITY): Payer: Self-pay | Admitting: Urology

## 2022-09-01 LAB — SURGICAL PATHOLOGY

## 2022-09-14 ENCOUNTER — Other Ambulatory Visit (HOSPITAL_COMMUNITY): Payer: Self-pay

## 2022-09-14 MED ORDER — AMLODIPINE BESYLATE 5 MG PO TABS
5.0000 mg | ORAL_TABLET | Freq: Every evening | ORAL | 2 refills | Status: DC
  Filled 2022-09-14 – 2022-09-26 (×2): qty 90, 90d supply, fill #0

## 2022-09-19 ENCOUNTER — Ambulatory Visit (INDEPENDENT_AMBULATORY_CARE_PROVIDER_SITE_OTHER): Payer: 59

## 2022-09-19 DIAGNOSIS — J069 Acute upper respiratory infection, unspecified: Secondary | ICD-10-CM

## 2022-09-19 NOTE — Progress Notes (Signed)
I connected with  Jane [name] on 09/19/22 by telephone and verified that I am speaking with the correct person using two identifiers.   I discussed the limitations of evaluation and management by telemedicine. The patient expressed understanding and agreed to proceed.  CC: productive cough  This is a telephone encounter between Jane [name] and Iona Coach on 09/19/2022 for productive cough. The visit was conducted with the patient located at home and Iona Coach at [facility]. The patient's identity was confirmed using their DOB and current address. The patient has consented to being evaluated through a telephone encounter and understands the associated risks (an examination cannot be done and the patient may need to come in for an appointment) / benefits (allows the patient to remain at home, decreasing exposure to coronavirus). I personally spent 15 minutes on medical discussion.   HPI:  [name] is a 46 y.o. with PMH as below.   Please see A&P for assessment of the patient's acute and chronic medical conditions.   Past Medical History:  Diagnosis Date   Abnormal uterine bleeding (AUB)    Arthritis of knee    Left, Gel and cortisone injections @ [facility]   Asthma    prn inhaler   CAD S/P BMS PCI to prox LAD cardiologist--- dr Beverlyn Roux LAD to Mid LAD lesion, 90% stenosed. Post intervention - Vision BMS 3.0 mm x 18 mm (~3.5 mm) there is a 0% residual stenosis. ;   nuclear stress test-- 09-13-2018 low risk with no ischemia, nuclear ef 56%   Charcot foot due to diabetes mellitus ([place]) 11/2016   left 2018; right 2019   Depression    Diabetic foot ulcer ([place]) 04/22/2019   Diabetic neuropathy (HCC)    feet   Diabetic retinopathy, nonproliferative, severe ([place])    bilateral   Dyspnea    ESRD (end stage renal disease) on dialysis [facility])    ?chronic interstitial nephritis  ([facility]   H/O non-ST elevation myocardial infarction (NSTEMI)  03/2016   Found in 90% mid LAD lesion treated with bare-metal stent (BMS) PCI - vision BMS 3.0 mm x 18 mm   Heart murmur    History of MRSA infection 2007   Hyperlipidemia    Hypertension    IDA (iron deficiency anemia)    Insulin dependent type 2 diabetes mellitus (HCC)    Iron deficiency anemia    takes iron supplement   Myocardial infarction [facility])    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 ([place]) 05/23/2018   PAOD (peripheral arterial occlusive disease) ([place])    vascular--- dr Bridgett Larsson   Pneumonia    PONV (postoperative nausea and vomiting)    S/P arteriovenous (AV) fistula creation 07/2017   S/p bare metal coronary artery stent 03/31/2016   BMS x1  to pLAD   Sickle cell trait ([place])    Ulcer of left foot due to type 2 diabetes mellitus ([place]) 06/03/2019   Review of Systems:  Negative other than HPI  Assessment & Plan:   Upper respiratory infection with cough and congestion Patient reports cough with green and red sputum since Saturday that has gotten a little worse. She has associated chills, nasal congestion and runny nose, sore throat. She denies headache, myalgia or arthralgia, CP, SOB, wheezing, N/V, diarrhea. She has asthma and says she has not needed to use her inhaler in months. She has tried tessalon and cough drops with some relief. The patient is speaking  in full sentences without interruption or choppy sentences, has intermittent dry cough, and a strong and fully supported voice. She is unable to take her temperature at home at this time.I do not think this is volume overload related to ESRD as the patient went to HD today and is at her dry weight. Discussed with patient that it is difficult to determine URI vs bronchitis vs pneumonia without vitals and physical exam, but I would favor a mild URI with post nasal drip given her history of asthma and no associated SOB, pleuritic chest pain or inhaler requirements. She has gotten her flu shot and lack of headache or  myalgias would make me think less of influenza as the cause. I do not think she is having rusty colored sputum from infection but do think it is secondary to irritation of the upper airway. Counseled patient on lemon tea, salt water gargles, guaifenesin to break up mucous, and tessalon and cough drops as desires. Also counseled her about use of a mask and to take a COVID test, which she has at home. Discussed calling back if she develops fever, worsening SOB, or if things have not improved in a few days.   Patient discussed with Dr. Agnes Lawrence, MD [facility]  PGY-1 Phone: [phone] Date 09/19/2022  Time 1:52 PM

## 2022-09-19 NOTE — Assessment & Plan Note (Signed)
Patient reports cough with green and red sputum since Saturday that has gotten a little worse. She has associated chills, nasal congestion and runny nose, sore throat. She denies headache, myalgia or arthralgia, CP, SOB, wheezing, N/V, diarrhea. She has asthma and says she has not needed to use her inhaler in months. She has tried tessalon and cough drops with some relief. The patient is speaking in full sentences without interruption or choppy sentences, has intermittent dry cough, and a strong and fully supported voice. She is unable to take her temperature at home at this time.I do not think this is volume overload related to ESRD as the patient went to HD today and is at her dry weight. Discussed with patient that it is difficult to determine URI vs bronchitis vs pneumonia without vitals and physical exam, but I would favor a mild URI with post nasal drip given her history of asthma and no associated SOB, pleuritic chest pain or inhaler requirements. She has gotten her flu shot and lack of headache or myalgias would make me think less of influenza as the cause. I do not think she is having rusty colored sputum from infection but do think it is secondary to irritation of the upper airway. Counseled patient on lemon tea, salt water gargles, guaifenesin to break up mucous, and tessalon and cough drops as desires. Also counseled her about use of a mask and to take a COVID test, which she has at home. Discussed calling back if she develops fever, worsening SOB, or if things have not improved in a few days.

## 2022-09-19 NOTE — Progress Notes (Deleted)
T2DM A1c 9.4 08/2022 G6 dexcom Degludec 24 u nightly Lispro 6-20 units TID Ozempic 0.25 weekly  CAD s/p stenting Metop 25 BID Rosuvastatin 40 daily Asa 81  HTN Amlodipine 5   ESRD on HD [facility] Sevelamer 800  Hematuria Noncon CT normal Patient with abnormal mucosa on the posterior wall concerning for chronic cystitis versus possible neoplasm on cystoscopy, path showed chronic inflammation  Cough Prescription sent for Tessalon Perles, 100 mg TID PRN cough. Patient advised that the maximum dose per day of this medication is 600 mg/day.   Constipation She did have normal gastric emptying study but feels as though this test was inadequate  Reglan, senna, and miralax  GI referral for colonoscopy.   Takes oxycodone  Coughing, sore throat, phlegm red and green. Nasal congestion w running. Saturday morning. Cough cleared up. Chills. Fell at [place] on Saturday. Flu shot. CP. No SOB. No wheezing. Asthma. Hasn't used inhaler in months. Irritation in throat. No n/v, diarrhea Saturday. No sinus pain. Tessalon + cough drops some help. At dry weight, went to dialysis. Feeling like cough has worsened.

## 2022-09-22 ENCOUNTER — Other Ambulatory Visit (HOSPITAL_COMMUNITY): Payer: Self-pay

## 2022-09-23 ENCOUNTER — Other Ambulatory Visit (HOSPITAL_COMMUNITY): Payer: Self-pay

## 2022-09-23 MED ORDER — OXYCODONE-ACETAMINOPHEN 10-325 MG PO TABS
1.0000 | ORAL_TABLET | ORAL | 0 refills | Status: DC
  Filled 2022-09-23: qty 150, 30d supply, fill #0

## 2022-09-26 ENCOUNTER — Other Ambulatory Visit (HOSPITAL_COMMUNITY): Payer: Self-pay

## 2022-09-26 NOTE — Progress Notes (Signed)
Internal Medicine Clinic Attending  Case discussed with Dr. Rogers  At the time of the visit.  We reviewed the resident's history and exam and pertinent patient test results.  I agree with the assessment, diagnosis, and plan of care documented in the resident's note.  

## 2022-10-05 ENCOUNTER — Telehealth: Payer: Self-pay | Admitting: *Deleted

## 2022-10-05 NOTE — Telephone Encounter (Signed)
Jane [name],  This pt has ESRD on HD so her procedure will need to be done at the hospital.  Thanks,  Osvaldo Angst

## 2022-10-12 NOTE — Telephone Encounter (Signed)
I have called and spoke to patient, rescheduled pt for OV.

## 2022-10-19 ENCOUNTER — Other Ambulatory Visit (HOSPITAL_COMMUNITY): Payer: Self-pay

## 2022-10-19 MED ORDER — AMLODIPINE-OLMESARTAN 5-20 MG PO TABS
1.0000 | ORAL_TABLET | Freq: Every day | ORAL | 3 refills | Status: DC
  Filled 2022-10-19: qty 90, 90d supply, fill #0
  Filled 2022-12-23 – 2022-12-26 (×2): qty 90, 90d supply, fill #1
  Filled 2023-03-16: qty 90, 90d supply, fill #2

## 2022-10-20 ENCOUNTER — Other Ambulatory Visit (HOSPITAL_COMMUNITY): Payer: Self-pay

## 2022-10-21 ENCOUNTER — Other Ambulatory Visit (HOSPITAL_COMMUNITY): Payer: Self-pay

## 2022-10-21 MED ORDER — OXYCODONE-ACETAMINOPHEN 10-325 MG PO TABS
1.0000 | ORAL_TABLET | ORAL | 0 refills | Status: DC
  Filled 2022-10-21 – 2022-10-26 (×2): qty 150, 30d supply, fill #0

## 2022-10-26 ENCOUNTER — Other Ambulatory Visit (HOSPITAL_COMMUNITY): Payer: Self-pay

## 2022-10-26 ENCOUNTER — Other Ambulatory Visit: Payer: Self-pay

## 2022-10-31 ENCOUNTER — Encounter: Payer: Self-pay | Admitting: Cardiology

## 2022-10-31 ENCOUNTER — Ambulatory Visit: Payer: 59 | Attending: Cardiology | Admitting: Cardiology

## 2022-10-31 VITALS — BP 150/76 | HR 89 | Ht 62.0 in | Wt 219.0 lb

## 2022-10-31 DIAGNOSIS — I5032 Chronic diastolic (congestive) heart failure: Secondary | ICD-10-CM

## 2022-10-31 DIAGNOSIS — I779 Disorder of arteries and arterioles, unspecified: Secondary | ICD-10-CM | POA: Diagnosis not present

## 2022-10-31 DIAGNOSIS — I2510 Atherosclerotic heart disease of native coronary artery without angina pectoris: Secondary | ICD-10-CM

## 2022-10-31 DIAGNOSIS — Z9861 Coronary angioplasty status: Secondary | ICD-10-CM

## 2022-10-31 DIAGNOSIS — R002 Palpitations: Secondary | ICD-10-CM

## 2022-10-31 DIAGNOSIS — I10 Essential (primary) hypertension: Secondary | ICD-10-CM | POA: Diagnosis not present

## 2022-10-31 DIAGNOSIS — I110 Hypertensive heart disease with heart failure: Secondary | ICD-10-CM

## 2022-10-31 DIAGNOSIS — E785 Hyperlipidemia, unspecified: Secondary | ICD-10-CM

## 2022-10-31 DIAGNOSIS — E1169 Type 2 diabetes mellitus with other specified complication: Secondary | ICD-10-CM

## 2022-10-31 DIAGNOSIS — R000 Tachycardia, unspecified: Secondary | ICD-10-CM

## 2022-10-31 NOTE — Progress Notes (Signed)
Primary Care Provider: Linus Galas, [place] Cardiologist: Glenetta Hew, MD Electrophysiologist: None  Clinic Note: Chief Complaint  Patient presents with   Follow-up    6 months.   Coronary Artery Disease    Only the occasional chest pain with hemodialysis.   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  Moderate LVH. BP has been pretty well-controlled, but is labile.  Somewhat high today.  Will defer BP management to nephrologist since she is seen more frequently.  For now we will continue current dose of Azor 5-20 mg daily and Lopressor 25 mg twice daily.  Volume control by HD.      Hyperlipidemia associated with type 2 diabetes mellitus ([place] ([place]ronic)    She never got her lipids checked.  Would asked that she get them checked at PCPs office.  She was post have lipids and chemistry panel done.  She should have routine labs checked for diabetes as well.  She is currently on Vascepa along with 40 mg rosuvastatin for lipids.  Last check was March 2022-pretty well-controlled.  Last A1c was 9.4 back in October.  Being managed again by PCP/endocrinology.  Not currently on GLP-1 agonist.      Essential hypertension (Chronic)   Relevant Orders   EKG 12-Lead (Completed)   CAD S/P BMS PCI to prox LAD - Primary (Chronic)    Ostial LAD BMS PCI back in May 2017.  [place] hours plan for her to be on aspirin alone. No further angina symptoms since PCI.  No significant atherosclerotic disease in the other coronaries.  Plan: Continue current low-dose Lopressor 25 mg twice daily (hold  for dialysis.) Continue amlodipine having a losartan will 5-20 mg daily (Azor) Continue rosuvastatin 40 mg daily.  Along with Vascepa 2 g twice daily She had been on Ozempic, but not on it any longer.  Will defer to endocrinology and nephrology, but I think she would probably benefit. Continue maintenance dose of aspirin.  Okay to hold for procedures or surgeries.        Other   Sinus tachycardia    Sinus tachycardia/palpitations well-controlled on current dose of beta-blocker.       ===================================  HPI:    Jane COLBAUGHis a morbidly obese 46y.o. female with a PMH notable for CAD, Chronic HFpEF, HTN, HLD, DM type II and ESRD on HD below who presents today for 630-monthollow-up at the request of SrLinus Galas.  CAD/non-STEMI (May 2017) s/p BMS PCI to prox LAD  Chronic Diastolic Heart Failure,  Palpitations, Sinus Tachycardia, ,  DM-2,  ESRD on HD (MWF) PAD -> Diabetic Ulcers-history of Charcot foot Peripheral Neuropathy  Morbid Obesity,  HTN, HLD Endometriosis with abnormal uterine bleeding.  I last saw Jane [name] November 23, 2021 as a hospital follow-up, and preop evaluation for hysteroscopy, endometrial ablation in February.  Limited by knee pain, but able to achieve 6 METS with water aerobics.  No angina or dyspnea with rest or exertion.  => She had not been restarted on aspirin or Plavix when discharged from hospital.  I recommended simply going back on  aspirin.  She was trying to do so exercises with water aerobics.  She was was last seen on May 18, 2022 by Laurann Montana, NP: Stable regarding standpoint.  No active symptoms.  Increased stress due to trying to find a new apartment.  Unhappy with HD tech noted elevated BP at start of dialysis that improved dramatically from 190s down to 110 with dialysis. Lipid panel ordered.  Recent Hospitalizations:  February 7-> hysteroscopy, D&C, IUD removal. ->  Bilateral  salpingectomy, ER visit on 12/31/2021 for abnormal uterine bleeding => restarted Megace. October 17: Cystoscopy with fulguration, biopsy, left retrograde pyelogram  Reviewed  CV studies:    The following studies were reviewed today: (if available, images/films reviewed: From Epic Chart or Care Everywhere) N/A:  Interval History:   Jane [name] returns here today overall still doing quite well.  She is now on Azor (amlodipine-olmesartan 5 to 20 mg daily started by her nephrologist.  She is also on low-dose beta-blocker-metoprolol 25 mg twice daily, and BP seems to be more stable although it is elevated today.  She denies any headache or blurred vision or dizziness.  She still gets some low blood pressure readings during dialysis and is sometimes dizzy after dialysis, but usually her pressures are high starting dialysis. No PND, orthopnea with no significant edema.  About the only time she notes any chest pain is near the end of dialysis as her blood pressures get low.  She admittedly is not all that active, but denies any chest pain or pressure with rest exertion.  Currently not doing much because her knees are bothering her quite a bit.  Charcot foot seems to be healing up quite well.  She is pretty much stable from cardiac standpoint with exception of blood pressure being somewhat labile.  CV Review of Symptoms (Summary): no chest pain or dyspnea on exertion positive for - labile blood pressures and rare palpitations. negative for - edema, irregular heartbeat, orthopnea, paroxysmal nocturnal dyspnea, rapid heart rate, shortness of breath, or syncope or near syncope, TIA/amaurosis fugax or claudication  REVIEWED OF SYSTEMS   Review of Systems  Constitutional:  Negative for malaise/fatigue and weight loss.  HENT:  Negative for congestion and nosebleeds.   Respiratory:  Negative for cough and shortness of breath.   Gastrointestinal:  Negative for blood in stool and melena.   Genitourinary:  Negative for dysuria, flank pain and hematuria.       Minimal urine output  Musculoskeletal:  Positive for joint pain (Left > right knee).  Neurological:  Negative for dizziness (Only occasionally after dialysis) and weakness.  Psychiatric/Behavioral: Negative.     I have reviewed and (if needed) personally updated the patient's problem list, medications, allergies, past medical and surgical history, social and family history.   PAST MEDICAL HISTORY   Past Medical History:  Diagnosis Date   Abnormal uterine bleeding (AUB)    Arthritis of knee    Left, Gel and cortisone injections @ [facility]   Asthma    prn inhaler   CAD S/P BMS PCI to prox LAD cardiologist--- dr Beverlyn Roux LAD to Mid LAD lesion, 90% stenosed. Post intervention - Vision BMS 3.0 mm x 18 mm (~3.5 mm) there is a 0% residual stenosis. ;   nuclear stress test-- 09-13-2018 low risk with no ischemia, nuclear ef 56%   Charcot foot due to diabetes mellitus ([place]) 11/2016   left 2018; right 2019   Depression    Diabetic foot ulcer ([place])  04/22/2019   Diabetic neuropathy (HCC)    feet   Diabetic retinopathy, nonproliferative, severe ([place])    bilateral   Dyspnea    ESRD (end stage renal disease) on dialysis [facility])    ?chronic interstitial nephritis  ([facility]   H/O non-ST elevation myocardial infarction (NSTEMI) 03/2016   Found in 90% mid LAD lesion treated with bare-metal stent (BMS) PCI - vision BMS 3.0 mm x 18 mm   Heart murmur    History of MRSA infection 2007   Hyperlipidemia    Hypertension    IDA (iron deficiency anemia)    Insulin dependent type 2 diabetes mellitus (HCC)    Iron deficiency anemia    takes iron supplement   Myocardial infarction [facility]    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 ([place]) 05/23/2018   PAOD (peripheral arterial occlusive disease) ([place])    vascular--- dr Bridgett Larsson   Pneumonia    PONV (postoperative nausea and vomiting)    S/P  arteriovenous (AV) fistula creation 07/2017   S/p bare metal coronary artery stent 03/31/2016   BMS x1  to pLAD   Sickle cell trait ([place])    Ulcer of left foot due to type 2 diabetes mellitus ([place]) 06/03/2019    PAST SURGICAL HISTORY   Past Surgical History:  Procedure Laterality Date   A/V FISTULAGRAM N/A 04/13/2021   Procedure: A/V FISTULAGRAM - Right Arm;  Surgeon: Serafina Mitchell, MD;  Location: [facility];  Service: Cardiovascular;  Laterality: N/A;   ACHILLES TENDON LENGTHENING Left 11/24/2016   Procedure: Left Achilles tendon lengthening (open);  Surgeon: Wylene Simmer, MD;  Location: [facility];  Service: Orthopedics;  Laterality: Left;   AV FISTULA PLACEMENT Right 07/31/2017   Procedure: ARTERIOVENOUS BRACHIOCEPHALIC FISTULA CREATION RIGHT ARM;  Surgeon: Conrad St. Leonard, MD;  [facility];  Service: Vascular;  Laterality: Right;   CALCANEAL OSTEOTOMY Left 11/24/2016   Procedure: Left hindfoot osteotomy and fusion;  Surgeon: Wylene Simmer, MD;  Location: [facility];  Service: Orthopedics;  Laterality: Left;   CARDIAC CATHETERIZATION N/A 03/31/2016   Procedure: Left Heart Cath and Coronary Angiography;  Surgeon: Lorretta Harp, MD;  Location: [facility] INVASIVE CV LAB: 90% early mLAD, normal LV Fxn   CARDIAC CATHETERIZATION N/A 03/31/2016   Procedure: Coronary Stent Intervention;  Surgeon: Lorretta Harp, MD;  Location: [facility] CV [facility];  Service: Cardiovascular: PCI to mLAD BMS Vision 3.0 mm x 18 mm   [place] N/A 08/30/2022   Procedure: CYSTOSCOPY WITH FULGERATION, BIOPSY, LEFT RETROGRADE PYELOGRAM;  Surgeon: Robley Fries, MD;  Location: [facility];  Service: Urology;  Laterality: N/A;   DILITATION & CURRETTAGE/HYSTROSCOPY WITH NOVASURE ABLATION N/A 09/15/2020   Procedure: DILATATION & CURETTAGE/HYSTEROSCOPY;  Surgeon: Lavonia Drafts, MD;  Location: [place] [facility];  Service:  Gynecology;  Laterality: N/A;   DILITATION & CURRETTAGE/HYSTROSCOPY WITH NOVASURE ABLATION N/A 12/21/2021   Procedure: [place] ABLATION/ IUD REMOVAL;  Surgeon: Lavonia Drafts, MD;  Location: [facility];  Service: Gynecology;  Laterality: N/A;   FISTULA SUPERFICIALIZATION Right 11/15/2017   Procedure: FISTULA SUPERFICIALIZATION RIGHT BRACHIOCEPHALIC  RIGHT;  Surgeon: Conrad Jewell, MD;  Location: [facility];  Service: Vascular;  Laterality: Right;   FOOT SURGERY     multiple for [place]   PERIPHERAL VASCULAR BALLOON ANGIOPLASTY Right 04/13/2021   Procedure: PERIPHERAL VASCULAR BALLOON ANGIOPLASTY;  Surgeon: Serafina Mitchell, MD;  Location: [facility];  Service: Cardiovascular;  Laterality: Right;  arm fistula   TRANSTHORACIC ECHOCARDIOGRAM  02/2021   EF 55 to 60%.  No R WMA.  Mild septal LVH with moderate posterior hypertrophy.  Normal RV size and function.  Normal RVP/PASP.Marland Kitchen  Trivial MR.   TUBAL LIGATION  1997   Cardiac catheterization with PCI May 2017:90% mLAD; nl LV Fxn --> PCI - Vision BMS 3.102m x 18 mm        Immunization History  Administered Date(s) Administered   19-influenza Whole 07/30/2019   Hepatitis B, adult 01/25/2018, 02/24/2018, 03/22/2018, 03/24/2018, 07/26/2018   Influenza Whole 01/11/2009, 07/22/2011   Influenza,inj,Quad PF,6+ Mos 08/05/2014, 12/24/2015, 08/04/2016, 08/29/2017, 08/01/2018, 07/30/2019, 09/24/2020   Moderna Sars-Covid-2 Vaccination 02/27/2020, 03/26/2020   PFIZER(Purple Top)SARS-COV-2 Vaccination 08/13/2020   Pneumococcal Conjugate-13 01/30/2018, 08/01/2018   Pneumococcal Polysaccharide-23 01/11/2009, 08/15/2014, 03/31/2018   Tdap 10/28/2013    MEDICATIONS/ALLERGIES   Current Meds  Medication Sig   Accu-Chek Softclix Lancets lancets Use to check blood sugar up to 3 (three) times daily.   acetaminophen (TYLENOL) 500 MG tablet Take 1,000 mg by mouth every 6 (six) hours as needed  (for pain.).   albuterol (PROVENTIL HFA) 108 (90 Base) MCG/ACT inhaler Inhale 2 puffs into the lungs every 4 (four) hours as needed for coughing, wheezing, or shortness of breath.   amLODipine (NORVASC) 5 MG tablet Take 1 tablet (5 mg total) by mouth every night   amLODipine-olmesartan (AZOR) 5-20 MG tablet Take 1 tablet by mouth every night   aspirin EC 81 MG tablet Take 1 tablet (81 mg total) by mouth daily. Swallow whole.   benzonatate (TESSALON PERLES) 100 MG capsule Take 2 capsules (200 mg total) by mouth 3 (three) times daily as needed for cough up to 14 days   blood glucose meter kit and supplies Dispense based on patient and insurance preference. Use up to four times daily as directed. (FOR ICD-10 E10.9, E11.9).   Blood Glucose Monitoring Suppl (ACCU-CHEK GUIDE) w/Device KIT 1 each by Does not apply route 3 (three) times daily.   cetirizine (ZYRTEC) 10 MG tablet Take 10 mg by mouth daily as needed for allergies.   cetirizine (ZYRTEC) 5 MG tablet Take 1 tablet (5 mg total) by mouth daily.   Cholecalciferol (VITAMIN D3) 50 MCG (2000 UT) CAPS Take 4 capsules (8,000 Units total) by mouth daily.   Continuous Blood Gluc Receiver (DEXCOM G6 RECEIVER) DEVI Use to check blood sugar at least 6 (six) times daily.   Continuous Blood Gluc Sensor (DEXCOM G6 SENSOR) MISC Use to check blood sugar at least 6 (six) times daily.   Continuous Blood Gluc Transmit (DEXCOM G6 TRANSMITTER) MISC Use to check blood sugar at least 6 times a day   fluticasone (FLONASE) 50 MCG/ACT nasal spray Place 2 sprays into both nostrils every night (Patient taking differently: Place 2 sprays into both nostrils at bedtime as needed for allergies.)   gabapentin (NEURONTIN) 100 MG capsule Take 1 capsule (100 mg total) by mouth at bedtime.   glucose blood (ACCU-CHEK GUIDE) test strip Use to check blood sugar up to 3 (three) times daily.   guanFACINE (INTUNIV) 1 MG TB24 ER tablet Take 1 tablet (1 mg total) by mouth daily.   ibuprofen  (ADVIL) 800 MG tablet Take 1 tablet (800 mg total) by mouth every 6 (six) hours as needed for pain.   icosapent Ethyl (VASCEPA) 1 g capsule Take 2 capsules (2 g total) by mouth 2 (two) times daily.  injection device for insulin (INPEN 100-PINK-LILLY-HUMALOG) DEVI Use to inject Humalog insulin for meals and correction insulin   insulin degludec (TRESIBA) 100 UNIT/ML FlexTouch Pen Inject 24 Units into the skin daily. (Patient taking differently: Inject 24 Units into the skin at bedtime.)   insulin lispro (HUMALOG) 100 UNIT/ML cartridge Use with inpen to inject at meal time and for correction insulin up to 40 units daily (Patient taking differently: Inject 6-20 Units into the skin 3 (three) times daily with meals. Use with inpen to inject at meal time and for correction insulin up to 40 units daily)   Insulin Pen Needle 32G X 4 MM MISC Use to inject insulin 4 (four) times daily.   Insulin Syringe-Needle U-100 31G X 15/64" 0.3 ML MISC Use to inject insulin daily   lanthanum (FOSRENOL) 1000 MG chewable tablet Chew 1 tablet (1,000 mg total) by mouth 3 (three) times daily with meals.   megestrol (MEGACE) 40 MG tablet Take 1 tablet (40 mg total) by mouth 2 (two) times daily.   metoprolol tartrate (LOPRESSOR) 25 MG tablet Take 1 tablet (25 mg total) by mouth 2 (two) times daily. Do not take the night before dialysis, and take 2 hours after dialysis.   multivitamin (RENA-VIT) TABS tablet Take 1 tablet by mouth daily.   oxyCODONE-acetaminophen (PERCOCET) 10-325 MG tablet Take 1 tablet by mouth 5 (five) times daily as needed   oxyCODONE-acetaminophen (PERCOCET) 10-325 MG tablet Take 1 tablet by mouth 5 (five) times daily as needed.   oxyCODONE-acetaminophen (PERCOCET) 10-325 MG tablet Take 1 tablet by mouth 5 times a day   polyethylene glycol (MIRALAX / GLYCOLAX) 17 g packet Take 17 g by mouth every other day.   rosuvastatin (CRESTOR) 40 MG tablet Take 1 tablet (40 mg total) by mouth daily.   Semaglutide,0.25  or 0.5MG/DOS, (OZEMPIC, 0.25 OR 0.5 MG/DOSE,) 2 MG/1.5ML SOPN Inject 0.25 mg into the skin once a week.   senna-docusate (SENOKOT-S) 8.6-50 MG tablet Take 1 tablet by mouth daily.   sertraline (ZOLOFT) 25 MG tablet Take 1 tablet (25 mg total) by mouth daily.   sevelamer carbonate (RENVELA) 800 MG tablet Take 1,600-3,200 mg by mouth See admin instructions. Take 3200 mg with each meal and 1600 mg with each snack   Sodium Fluoride 1.1 % PSTE Brush teeth with small amount 2 times a day   Current Facility-Administered Medications for the 10/31/22 encounter (Office Visit) with [name], MD  Medication   polyethylene glycol powder (GLYCOLAX/MIRALAX) container 255 g    Allergies  Allergen Reactions   Adhesive [Tape] Other (See Comments)    Irritation     SOCIAL HISTORY/FAMILY HISTORY   Reviewed in Epic:  Pertinent findings:  Social History   Tobacco Use   Smoking status: Former    Packs/day: 0.00    Years: 0.50    Total pack years: 0.00    Types: Cigarettes    Quit date: 08/29/2015    Years since quitting: 7.2   Smokeless tobacco: Never  Vaping Use   Vaping Use: Never used  Substance Use Topics   Alcohol use: Not Currently   Drug use: Never    Comment: last use 03-31-2021   Social History   Social History Narrative   Patient does not drink caffeine.   Patient is right handed.       --> 2020 was a difficult year: Her sister died in 04-01-2023 (potentially because of COVID-19), her recent ex-boyfriend died in either 2023/03/02 or 2023/04/01, this was  followed by one of her close friends being murdered by the boyfriend.    --> Good news for 2020 was that she had a new grandkids.    OBJCTIVE -PE, EKG, labs   Wt Readings from Last 3 Encounters:  10/31/22 219 lb (99.3 kg)  08/30/22 220 lb 7.4 oz (100 kg)  08/09/22 220 lb 6.4 oz (100 kg)    Physical Exam: BP (!) 150/76 (BP Location: Left Arm, Patient Position: Sitting, Cuff Size: Large)   Pulse 89   Ht _0  (1.575 m)   Wt 219 lb  (99.3 kg)   BMI 40.06 kg/m  - did not take BP meds Physical Exam Vitals reviewed.  Constitutional:      General: She is not in acute distress.    Appearance: She is obese. She is not ill-appearing or toxic-appearing.     Comments: Morbidly obese.  Well-groomed.  HENT:     Head: Normocephalic and atraumatic.  Neck:     Vascular: No carotid bruit (Fistula bruit) or JVD.  Cardiovascular:     Rate and Rhythm: Normal rate and regular rhythm. Occasional Extrasystoles are present.    Heart sounds: S1 normal and S2 normal. Heart sounds are distant. Murmur heard.     High-pitched harsh crescendo-decrescendo early systolic murmur is present with a grade of 1/6 at the upper right sternal border radiating to the neck.     No friction rub. Gallop present. S4 sounds present.  Pulmonary:     Effort: Pulmonary effort is normal. No respiratory distress.     Breath sounds: Normal breath sounds. No wheezing, rhonchi or rales.  Chest:     Chest wall: No tenderness.  Musculoskeletal:        General: Swelling (Trivial ankle swelling) present.     Cervical back: Normal range of motion and neck supple.  Skin:    General: Skin is warm and dry.  Neurological:     General: No focal deficit present.     Mental Status: She is alert and oriented to person, place, and time.     Gait: Gait abnormal.  Psychiatric:        Mood and Affect: Mood normal.        Behavior: Behavior normal.        Thought Content: Thought content normal.        Judgment: Judgment normal.     Adult ECG Report  Rate: 89;  Rhythm: normal sinus rhythm and left axis deviation.  Anteroseptal MI, age-indeterminate.  Artifact.  Stable ;   Narrative Interpretation: Stable  Recent Labs: Reviewed, has not had labs checked like she was post to have Lab Results  Component Value Date   CHOL 130 01/27/2021   HDL 44 01/27/2021   LDLCALC 66 01/27/2021   TRIG 107 01/27/2021   CHOLHDL 3.0 01/27/2021   Lab Results  Component Value Date    CREATININE 11.11 (H) 08/30/2022   BUN 41 (H) 08/30/2022   NA 138 08/30/2022   K 4.4 08/30/2022   CL 98 08/30/2022   CO2 27 08/30/2022      Latest Ref Rng & Units 08/30/2022    7:40 AM 08/23/2022   10:53 AM 02/28/2022    8:16 AM  CBC  WBC 4.0 - 10.5 K/uL 5.6  5.4  6.9   Hemoglobin 12.0 - 15.0 g/dL 10.2  10.9  9.1   Hematocrit 36.0 - 46.0 % 32.8  35.7  29.4   Platelets 150 - 400 K/uL 257  216  253     Lab Results  Component Value Date   HGBA1C 9.4 (H) 08/23/2022   Lab Results  Component Value Date   TSH 0.91 06/15/2016    ================================================== I spent a total of 21 minutes with the patient spent in direct patient consultation.  Additional time spent with chart review  / charting (studies, outside notes, etc): 17 min Total Time: 38 min  Current medicines are reviewed at length with the patient today.  (+/- concerns) none  Notice: This dictation was prepared with Dragon dictation along with smart phrase technology. Any transcriptional errors that result from this process are unintentional and may not be corrected upon review.  Studies Ordered:   Orders Placed This Encounter  Procedures   EKG 12-Lead   No orders of the defined types were placed in this encounter.   Patient Instructions / Medication Changes & Studies & Tests Ordered   Patient Instructions  Medication Instructions:   No changes     Lab Work: Not needed   Testing/Procedures:  Not needed  Follow-Up: At [facility], you and your health needs are our priority.  As part of our continuing mission to provide you with exceptional heart care, we have created designated Provider Care Teams.  These Care Teams include your primary Cardiologist (physician) and Advanced Practice Providers (APPs -  Physician Assistants and Nurse Practitioners) who all work together to provide you with the care you need, when you need it.     Your next appointment:   6 month(s)  The  format for your next appointment:   In Person  Provider:   Emelia Loron  PA in 6 months then, Glenetta Hew, MD will plan to see you again in 12 month(s).      [name], MD, MS Glenetta Hew, M.D., M.S. Interventional Cardiologist  [facility]  Pager # [phone] Phone # [phone] [address]. [place], [place] [address]   Thank you for choosing [facility]!!

## 2022-10-31 NOTE — Patient Instructions (Signed)
Medication Instructions:   No changes     Lab Work: Not needed   Testing/Procedures:  Not needed  Follow-Up: At [facility], you and your health needs are our priority.  As part of our continuing mission to provide you with exceptional heart care, we have created designated Provider Care Teams.  These Care Teams include your primary Cardiologist (physician) and Advanced Practice Providers (APPs -  Physician Assistants and Nurse Practitioners) who all work together to provide you with the care you need, when you need it.     Your next appointment:   6 month(s)  The format for your next appointment:   In Person  Provider:   Emelia Loron  PA in 6 months then, Glenetta Hew, MD will plan to see you again in 12 month(s).

## 2022-11-01 ENCOUNTER — Telehealth: Payer: Self-pay | Admitting: *Deleted

## 2022-11-01 ENCOUNTER — Encounter: Payer: 59 | Admitting: Gastroenterology

## 2022-11-01 NOTE — Telephone Encounter (Signed)
Spoke with patient regarding her missed appointment. Patient had to go for her treatment today instead of her regular scheduled day. Clinic to call and reschedule appointment.

## 2022-11-02 ENCOUNTER — Ambulatory Visit: Payer: 59 | Admitting: Obstetrics & Gynecology

## 2022-11-16 ENCOUNTER — Encounter: Payer: Self-pay | Admitting: Cardiology

## 2022-11-16 NOTE — Assessment & Plan Note (Signed)
She never got her lipids checked.  Would asked that she get them checked at PCPs office.  She was post have lipids and chemistry panel done.  She should have routine labs checked for diabetes as well.  She is currently on Vascepa along with 40 mg rosuvastatin for lipids.  Last check was March 2022-pretty well-controlled.  Last A1c was 9.4 back in October.  Being managed again by PCP/endocrinology.  Not currently on GLP-1 agonist.

## 2022-11-16 NOTE — Assessment & Plan Note (Signed)
Will be due for follow-up lower extremity arterial Dopplers at the 30-monthfollow-up with CLaurann Montana NP

## 2022-11-16 NOTE — Assessment & Plan Note (Signed)
Normal EF on echo.  Moderate LVH. BP has been pretty well-controlled, but is labile.  Somewhat high today.  Will defer BP management to nephrologist since she is seen more frequently.  For now we will continue current dose of Azor 5-20 mg daily and Lopressor 25 mg twice daily.  Volume control by HD.

## 2022-11-16 NOTE — Assessment & Plan Note (Signed)
Ostial LAD BMS PCI back in May 2017.  Fornoff hours plan for her to be on aspirin alone. No further angina symptoms since PCI.  No significant atherosclerotic disease in the other coronaries.  Plan: Continue current low-dose Lopressor 25 mg twice daily (hold for dialysis.) Continue amlodipine having a losartan will 5-20 mg daily (Azor) Continue rosuvastatin 40 mg daily.  Along with Vascepa 2 g twice daily She had been on Ozempic, but not on it any longer.  Will defer to endocrinology and nephrology, but I think she would probably benefit. Continue maintenance dose of aspirin.  Okay to hold for procedures or surgeries.

## 2022-11-16 NOTE — Assessment & Plan Note (Signed)
Sinus tachycardia/palpitations well-controlled on current dose of beta-blocker.

## 2022-11-17 ENCOUNTER — Ambulatory Visit: Payer: 59 | Admitting: Gastroenterology

## 2022-11-22 ENCOUNTER — Other Ambulatory Visit (HOSPITAL_COMMUNITY): Payer: Self-pay

## 2022-11-22 MED ORDER — OXYCODONE-ACETAMINOPHEN 10-325 MG PO TABS
1.0000 | ORAL_TABLET | Freq: Four times a day (QID) | ORAL | 0 refills | Status: DC | PRN
  Filled 2022-11-22: qty 150, 38d supply, fill #0
  Filled 2022-11-25: qty 150, 30d supply, fill #0

## 2022-11-24 ENCOUNTER — Other Ambulatory Visit (HOSPITAL_COMMUNITY): Payer: Self-pay

## 2022-11-25 ENCOUNTER — Other Ambulatory Visit (HOSPITAL_COMMUNITY): Payer: Self-pay

## 2022-11-28 ENCOUNTER — Other Ambulatory Visit: Payer: Self-pay

## 2022-12-23 ENCOUNTER — Other Ambulatory Visit: Payer: Self-pay

## 2022-12-23 ENCOUNTER — Encounter (HOSPITAL_COMMUNITY): Payer: Self-pay | Admitting: Pharmacist

## 2022-12-23 ENCOUNTER — Encounter (HOSPITAL_COMMUNITY): Payer: Self-pay

## 2022-12-23 ENCOUNTER — Other Ambulatory Visit (HOSPITAL_COMMUNITY): Payer: Self-pay

## 2022-12-23 DIAGNOSIS — Z794 Long term (current) use of insulin: Secondary | ICD-10-CM

## 2022-12-23 DIAGNOSIS — J45909 Unspecified asthma, uncomplicated: Secondary | ICD-10-CM

## 2022-12-26 ENCOUNTER — Telehealth: Payer: Self-pay | Admitting: *Deleted

## 2022-12-26 ENCOUNTER — Other Ambulatory Visit (HOSPITAL_COMMUNITY): Payer: Self-pay

## 2022-12-26 MED ORDER — ALBUTEROL SULFATE HFA 108 (90 BASE) MCG/ACT IN AERS
2.0000 | INHALATION_SPRAY | RESPIRATORY_TRACT | 2 refills | Status: DC | PRN
  Filled 2022-12-26: qty 20.1, 51d supply, fill #0
  Filled 2023-11-09: qty 6.7, 25d supply, fill #1

## 2022-12-26 MED ORDER — HUMALOG 100 UNIT/ML ~~LOC~~ SOCT
6.0000 [IU] | Freq: Three times a day (TID) | SUBCUTANEOUS | 3 refills | Status: DC
  Filled 2022-12-26: qty 15, 37d supply, fill #0

## 2022-12-26 NOTE — Telephone Encounter (Signed)
Patient called in stating she has misplaced her inpen. States she has a refill but cannot locate number to [employer]. [phone] given to patient from their Website. She was very Patent attorney.

## 2022-12-26 NOTE — Telephone Encounter (Signed)
Verified with Medical Records staff that we have not yet received from [facility] for inpen. Once this is received, will give to [facility] Team Provider to complete.

## 2022-12-26 NOTE — Telephone Encounter (Signed)
Patient called back stating the inpen Rx has expired. States we will be getting a fax from [facility] to sign and fax back.

## 2022-12-27 ENCOUNTER — Other Ambulatory Visit (HOSPITAL_COMMUNITY): Payer: Self-pay

## 2022-12-27 MED ORDER — OXYCODONE-ACETAMINOPHEN 10-325 MG PO TABS
1.0000 | ORAL_TABLET | Freq: Every day | ORAL | 0 refills | Status: DC
  Filled 2022-12-27: qty 150, 30d supply, fill #0

## 2022-12-27 NOTE — Telephone Encounter (Signed)
Patient called back requesting samples of humalog till she can get inpen refilled. We have samples of novolog not humalog. Discussed with Attending who stated this would be an acceptable exchange. 3 Novolog pens prepared and placed on top shelf of refrigerator.  Patient advised to contact [facility] as we have not received fax for inpen. Confirmed our fax number.

## 2023-01-03 NOTE — Telephone Encounter (Signed)
I conformed order being processed by [facility].

## 2023-01-04 ENCOUNTER — Ambulatory Visit: Payer: 59 | Admitting: Obstetrics & Gynecology

## 2023-01-05 ENCOUNTER — Encounter: Payer: 59 | Admitting: Student

## 2023-01-16 ENCOUNTER — Encounter: Payer: 59 | Admitting: Student

## 2023-01-25 ENCOUNTER — Ambulatory Visit: Payer: 59 | Admitting: Obstetrics & Gynecology

## 2023-01-25 ENCOUNTER — Other Ambulatory Visit (HOSPITAL_COMMUNITY): Payer: Self-pay

## 2023-01-25 MED ORDER — OXYCODONE-ACETAMINOPHEN 10-325 MG PO TABS
1.0000 | ORAL_TABLET | Freq: Every day | ORAL | 0 refills | Status: DC
  Filled 2023-01-25 – 2023-01-26 (×2): qty 150, 30d supply, fill #0

## 2023-01-26 ENCOUNTER — Other Ambulatory Visit (HOSPITAL_COMMUNITY): Payer: Self-pay

## 2023-02-08 ENCOUNTER — Other Ambulatory Visit (HOSPITAL_COMMUNITY): Payer: Self-pay

## 2023-02-08 MED ORDER — ONDANSETRON HCL 4 MG PO TABS
4.0000 mg | ORAL_TABLET | Freq: Three times a day (TID) | ORAL | 11 refills | Status: DC | PRN
  Filled 2023-02-08 – 2023-02-24 (×3): qty 30, 10d supply, fill #0

## 2023-02-23 ENCOUNTER — Other Ambulatory Visit (HOSPITAL_COMMUNITY): Payer: Self-pay

## 2023-02-24 ENCOUNTER — Other Ambulatory Visit (HOSPITAL_COMMUNITY): Payer: Self-pay

## 2023-02-24 MED ORDER — OXYCODONE-ACETAMINOPHEN 10-325 MG PO TABS
1.0000 | ORAL_TABLET | Freq: Every day | ORAL | 0 refills | Status: DC
  Filled 2023-02-24 – 2023-02-27 (×2): qty 150, 30d supply, fill #0

## 2023-02-27 ENCOUNTER — Other Ambulatory Visit: Payer: Self-pay

## 2023-02-27 ENCOUNTER — Other Ambulatory Visit (HOSPITAL_COMMUNITY): Payer: Self-pay

## 2023-03-01 ENCOUNTER — Telehealth: Payer: Self-pay | Admitting: Dietician

## 2023-03-01 DIAGNOSIS — E113413 Type 2 diabetes mellitus with severe nonproliferative diabetic retinopathy with macular edema, bilateral: Secondary | ICD-10-CM

## 2023-03-01 NOTE — Telephone Encounter (Signed)
Called patient about her Dexcom and inpen orders. She would like to upgrade to [facility] G7. 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 Call transfer to front office to make an appointment.

## 2023-03-02 ENCOUNTER — Other Ambulatory Visit (HOSPITAL_COMMUNITY): Payer: Self-pay

## 2023-03-02 MED ORDER — DEXCOM G7 SENSOR MISC
3 refills | Status: DC
  Filled 2023-03-02: qty 9, 90d supply, fill #0

## 2023-03-02 MED ORDER — DEXCOM G7 RECEIVER DEVI
0 refills | Status: DC
  Filled 2023-03-02: qty 1, 90d supply, fill #0

## 2023-03-08 ENCOUNTER — Other Ambulatory Visit (HOSPITAL_COMMUNITY): Payer: Self-pay

## 2023-03-08 ENCOUNTER — Telehealth: Payer: Self-pay

## 2023-03-08 ENCOUNTER — Ambulatory Visit (INDEPENDENT_AMBULATORY_CARE_PROVIDER_SITE_OTHER): Payer: 59 | Admitting: Obstetrics & Gynecology

## 2023-03-08 ENCOUNTER — Encounter: Payer: Self-pay | Admitting: Obstetrics & Gynecology

## 2023-03-08 ENCOUNTER — Other Ambulatory Visit (HOSPITAL_COMMUNITY)
Admission: RE | Admit: 2023-03-08 | Discharge: 2023-03-08 | Disposition: A | Discharge status: Home / Self Care | Payer: 59 | Source: Ambulatory Visit | Attending: Obstetrics & Gynecology | Admitting: Obstetrics & Gynecology

## 2023-03-08 VITALS — BP 178/74 | HR 89 | Ht 65.0 in | Wt 214.0 lb

## 2023-03-08 DIAGNOSIS — N939 Abnormal uterine and vaginal bleeding, unspecified: Secondary | ICD-10-CM

## 2023-03-08 DIAGNOSIS — Z01419 Encounter for gynecological examination (general) (routine) without abnormal findings: Secondary | ICD-10-CM | POA: Insufficient documentation

## 2023-03-08 DIAGNOSIS — Z1151 Encounter for screening for human papillomavirus (HPV): Secondary | ICD-10-CM | POA: Diagnosis not present

## 2023-03-08 NOTE — Progress Notes (Signed)
Patient presents for annual exam. Patient notes some occasional spotting.  Last pap 05/20/2020 Last mammogram 08/30/2022 Patient would like to wean off the megace because she desires to have a kidney transplant.  Armandina Stammer RN

## 2023-03-08 NOTE — Telephone Encounter (Signed)
-----   Message from Talbert Cage sent at 03/08/2023  3:45 PM EDT ----- Regarding: Bleeding Patient wanted to know while on progesterone - if she has any abnormal bleeding, how long does it need to persist before she calls to make an appointment?

## 2023-03-08 NOTE — Progress Notes (Signed)
Subjective:     Jane [name] is a 47 y.o. female here for a routine exam.  Current complaints: pt reports that she plans on getting a transplant. She also reports that she has not met with a Clinical cytogeneticist and has not been placed on the list. Takes Megace intermittently. Has not had bleeding for the past 2 weeks.  Has been on dyalisis for 5 years.   Gynecologic History No LMP recorded. Patient has had an ablation. Contraception: none Last Pap: 05/20/2020. Results were: normal Last mammogram: 08/30/2022. Results were: normal  Obstetric History OB History  Gravida Para Term Preterm AB Living  2 2 1 1  0 2  SAB IAB Ectopic Multiple Live Births  0 0 0 0      # Outcome Date GA Lbr Len/2nd Weight Sex Delivery Anes PTL Lv  2 Preterm           1 Term              The following portions of the patient's history were reviewed and updated as appropriate: allergies, current medications, past family history, past medical history, past social history, past surgical history, and problem list.  Review of Systems Pertinent items are noted in HPI.    Objective:  BP (!) 178/74   Pulse 89   Ht 5\' 5"  (1.651 m)   Wt 214 lb (97.1 kg)   BMI 35.61 kg/m  General Appearance:    Alert, cooperative, no distress, appears stated age  Head:    Normocephalic, without obvious abnormality, atraumatic  Eyes:    conjunctiva/corneas clear, EOM's intact, both eyes  Ears:    Normal external ear canals, both ears  Nose:   Nares normal, septum midline, mucosa normal, no drainage    or sinus tenderness  Throat:   Lips, mucosa, and tongue normal; teeth and gums normal  Neck:   Supple, symmetrical, trachea midline, no adenopathy;    thyroid:  no enlargement/tenderness/nodules  Back:     Symmetric, no curvature, ROM normal, no CVA tenderness  Lungs:     respirations unlabored  Chest Wall:    No tenderness or deformity   Heart:    Regular rate and rhythm  Breast Exam:    No tenderness, masses, or nipple  abnormality  Abdomen:     Soft, non-tender, bowel sounds active all four quadrants,    no masses, no organomegaly  Genitalia:    Normal female without lesion, discharge or tenderness     Extremities:   Extremities normal, atraumatic, no cyanosis or edema  Pulses:   2+ and symmetric all extremities  Skin:   Skin color, texture, turgor normal, no rashes or lesions     Assessment:    Healthy female exam.  Occ spotting. Rec stopping Megace.    Plan:  Jane [name] was seen today for gynecologic exam.  Diagnoses and all orders for this visit:  Well female exam with routine gynecological exam -     Cytology - PAP( [place])  Abnormal uterine bleeding (AUB)  F/u in 1 year or sooner prn  Jane [name] L. Jane [name], M.D., Jane [name]

## 2023-03-08 NOTE — Telephone Encounter (Signed)
Error

## 2023-03-13 ENCOUNTER — Ambulatory Visit (HOSPITAL_COMMUNITY)
Admission: RE | Admit: 2023-03-13 | Discharge: 2023-03-13 | Disposition: A | Discharge status: Home / Self Care | Payer: 59 | Source: Ambulatory Visit | Attending: Internal Medicine | Admitting: Internal Medicine

## 2023-03-13 ENCOUNTER — Ambulatory Visit (INDEPENDENT_AMBULATORY_CARE_PROVIDER_SITE_OTHER): Payer: 59

## 2023-03-13 ENCOUNTER — Other Ambulatory Visit (HOSPITAL_COMMUNITY): Payer: Self-pay

## 2023-03-13 ENCOUNTER — Ambulatory Visit (INDEPENDENT_AMBULATORY_CARE_PROVIDER_SITE_OTHER): Payer: 59 | Admitting: Dietician

## 2023-03-13 ENCOUNTER — Encounter: Payer: Self-pay | Admitting: Obstetrics & Gynecology

## 2023-03-13 VITALS — BP 176/81 | HR 96 | Wt 210.0 lb

## 2023-03-13 DIAGNOSIS — Z794 Long term (current) use of insulin: Secondary | ICD-10-CM

## 2023-03-13 DIAGNOSIS — Z992 Dependence on renal dialysis: Secondary | ICD-10-CM

## 2023-03-13 DIAGNOSIS — E113413 Type 2 diabetes mellitus with severe nonproliferative diabetic retinopathy with macular edema, bilateral: Secondary | ICD-10-CM

## 2023-03-13 DIAGNOSIS — I10 Essential (primary) hypertension: Secondary | ICD-10-CM

## 2023-03-13 DIAGNOSIS — Z0000 Encounter for general adult medical examination without abnormal findings: Secondary | ICD-10-CM

## 2023-03-13 DIAGNOSIS — K582 Mixed irritable bowel syndrome: Secondary | ICD-10-CM | POA: Diagnosis not present

## 2023-03-13 DIAGNOSIS — R079 Chest pain, unspecified: Secondary | ICD-10-CM | POA: Insufficient documentation

## 2023-03-13 DIAGNOSIS — E113493 Type 2 diabetes mellitus with severe nonproliferative diabetic retinopathy without macular edema, bilateral: Secondary | ICD-10-CM | POA: Diagnosis not present

## 2023-03-13 DIAGNOSIS — Z1211 Encounter for screening for malignant neoplasm of colon: Secondary | ICD-10-CM

## 2023-03-13 DIAGNOSIS — N186 End stage renal disease: Secondary | ICD-10-CM

## 2023-03-13 DIAGNOSIS — I2510 Atherosclerotic heart disease of native coronary artery without angina pectoris: Secondary | ICD-10-CM

## 2023-03-13 DIAGNOSIS — Z9861 Coronary angioplasty status: Secondary | ICD-10-CM

## 2023-03-13 DIAGNOSIS — I120 Hypertensive chronic kidney disease with stage 5 chronic kidney disease or end stage renal disease: Secondary | ICD-10-CM

## 2023-03-13 DIAGNOSIS — E1122 Type 2 diabetes mellitus with diabetic chronic kidney disease: Secondary | ICD-10-CM

## 2023-03-13 LAB — POCT GLYCOSYLATED HEMOGLOBIN (HGB A1C): Hemoglobin A1C: 7.8 % — AB (ref 4.0–5.6)

## 2023-03-13 LAB — CYTOLOGY - PAP
Comment: NEGATIVE
Diagnosis: NEGATIVE
High risk HPV: NEGATIVE

## 2023-03-13 LAB — GLUCOSE, CAPILLARY: Glucose-Capillary: 132 mg/dL — ABNORMAL HIGH (ref 70–99)

## 2023-03-13 MED ORDER — ONETOUCH DELICA PLUS LANCET33G MISC
1.0000 | Freq: Three times a day (TID) | 0 refills | Status: DC
  Filled 2023-03-13: qty 100, 25d supply, fill #0

## 2023-03-13 MED ORDER — INSULIN DEGLUDEC 100 UNIT/ML ~~LOC~~ SOPN
24.0000 [IU] | PEN_INJECTOR | Freq: Every day | SUBCUTANEOUS | 1 refills | Status: DC
Start: 2023-03-13 — End: 2024-10-02
  Filled 2023-03-13: qty 15, 62d supply, fill #0
  Filled 2023-11-09: qty 15, 62d supply, fill #1

## 2023-03-13 MED ORDER — LANCET DEVICE MISC
1.0000 | Freq: Three times a day (TID) | 0 refills | Status: DC
  Filled 2023-03-13: qty 1, 90d supply, fill #0

## 2023-03-13 MED ORDER — HUMALOG 100 UNIT/ML ~~LOC~~ SOCT
6.0000 [IU] | Freq: Three times a day (TID) | SUBCUTANEOUS | 3 refills | Status: DC
Start: 2023-03-13 — End: 2023-07-26
  Filled 2023-03-13: qty 15, 25d supply, fill #0

## 2023-03-13 MED ORDER — BLOOD GLUCOSE TEST VI STRP
ORAL_STRIP | 0 refills | Status: DC
  Filled 2023-03-13: qty 100, 25d supply, fill #0

## 2023-03-13 MED ORDER — DEXCOM G7 SENSOR MISC
3 refills | Status: DC
  Filled 2023-03-13: qty 9, 90d supply, fill #0

## 2023-03-13 MED ORDER — DICYCLOMINE HCL 10 MG PO CAPS
10.0000 mg | ORAL_CAPSULE | Freq: Three times a day (TID) | ORAL | 0 refills | Status: AC
  Filled 2023-03-13: qty 90, 30d supply, fill #0

## 2023-03-13 MED ORDER — BLOOD GLUCOSE METER KIT
1.0000 | PACK | Freq: Three times a day (TID) | 0 refills | Status: DC
  Filled 2023-03-13: qty 1, 1d supply, fill #0

## 2023-03-13 NOTE — Assessment & Plan Note (Signed)
Changing up diet to include less sugary processed food and more fruits and vegetables. She also thinks the significant change in diet could be contributing to stomach issues, talked with Lupita Leash about cooking vegetables instead of eating raw vegetables. Now is working third shift at a hotel, is on her feet more. Has lost weight and is keeping it down. Is highly motivated to make lifestyle changes to improve avg glucose levels. She is trying to get a kidney transplant. Taking tresiba 26 and humalog 6 TID. Took one dose of ozempic but wasn't able to get more. Had dexcom placed today. A1c improved to 7.8. -continue current insulin regimen -sent CGM and meter supplies -d/c ozempic -continue lifestyle modificaitons

## 2023-03-13 NOTE — Patient Instructions (Addendum)
[name], it was a pleasure seeing you today! You endorsed feeling well today. Below are some of the things we talked about this visit. We look forward to seeing you in the follow up appointment!  Today we discussed: Chest pain: Check in with the cardiology team  Belly pain: Take bentyl for cramps up to 3x a day. We'll keep an eye on these symptoms and consider things like pancreas or gallbladder as well. I'll call with lab results.  Diabetes: Keep taking your current insulin regimen. I refilled this and also sent meter/dexcom supplies.   I have ordered the following labs today:  Lab Orders         CMP14 + Anion Gap         POC Hbg A1C       Referrals ordered today:   Referral Orders         Ambulatory referral to Gastroenterology      I have ordered the following medication/changed the following medications:   Stop the following medications: Medications Discontinued During This Encounter  Medication Reason   Semaglutide,0.25 or 0.5MG /DOS, (OZEMPIC, 0.25 OR 0.5 MG/DOSE,) 2 MG/1.5ML SOPN Cost of medication   insulin degludec (TRESIBA) 100 UNIT/ML FlexTouch Pen Reorder   insulin lispro (HUMALOG) 100 UNIT/ML cartridge Reorder   Continuous Glucose Sensor (DEXCOM G7 SENSOR) MISC Reorder     Start the following medications: Meds ordered this encounter  Medications   Continuous Glucose Sensor (DEXCOM G7 SENSOR) MISC    Sig: Use it for 10 days to continuously monitor your blood sugars.    Dispense:  9 each    Refill:  3    The patient is insulin requiring, ICD 10 code E11.65. The patient tests continuously   insulin degludec (TRESIBA) 100 UNIT/ML FlexTouch Pen    Sig: Inject 24 Units into the skin at bedtime.    Dispense:  15 mL    Refill:  1   insulin lispro (HUMALOG) 100 UNIT/ML cartridge    Sig: Inject 6-20 Units total into the skin 3 (three) times daily with meals. Use with inpen to inject at meal time and for correction insulin up to 40 units daily     Dispense:  15 mL    Refill:  3   Lancet Device MISC    Sig: Use 1 each in the morning, at noon, and at bedtime.    Dispense:  1 each    Refill:  0   Lancets (ONETOUCH DELICA PLUS LANCET33G) MISC    Sig: 1 each in the morning, at noon, and at bedtime.    Dispense:  100 each    Refill:  0   Glucose Blood (BLOOD GLUCOSE TEST STRIPS) STRP    Sig: Check blood sugars fasting and then with each meal up to 4 times a day.    Dispense:  100 strip    Refill:  0   blood glucose meter kit and supplies    Sig: Use in the morning, at noon, and at bedtime.    Dispense:  1 each    Refill:  0   dicyclomine (BENTYL) 10 MG capsule    Sig: Take 1 capsule (10 mg total) by mouth 3 (three) times daily before meals.    Dispense:  90 capsule    Refill:  0     Follow-up:  4 weeks stomach problems     Please make sure to arrive 15 minutes prior to your next appointment. If you arrive late, you  may be asked to reschedule.   We look forward to seeing you next time. Please call our clinic at [phone] if you have any questions or concerns. The best time to call is Monday-Friday from 9am-4pm, but there is someone available 24/7. If after hours or the weekend, call the main hospital number and ask for the Internal Medicine Resident On-Call. If you need medication refills, please notify your pharmacy one week in advance and they will send [place] a request.  Thank you for letting us take part in your care. Wishing you the best!  Thank you, Lyndle Herrlich MD

## 2023-03-13 NOTE — Progress Notes (Signed)
Diabetes Self-Management Education  Visit Type: Annual Follow-Up  Appt. Start Time: 1345 Appt. End Time: 1415  03/13/2023  [name], identified by name and date of birth, is a 47 y.o. female with a diagnosis of Diabetes:  .   ASSESSMENT  Inpen she ordered another around February and has not heard.  She has lost weight and flowered her A1C both consistent with the dietary changes she described.  Lab Results  Component Value Date   HGBA1C 7.8 (A) 03/13/2023   HGBA1C 9.4 (H) 08/23/2022   HGBA1C 9.7 (A) 03/03/2022   HGBA1C 9.2 (H) 11/09/2021   HGBA1C 6.8 (A) 04/01/2021    Wt Readings from Last 10 Encounters:  03/13/23 210 lb (95.3 kg)  03/08/23 214 lb (97.1 kg)  10/31/22 219 lb (99.3 kg)  08/30/22 220 lb 7.4 oz (100 kg)  08/09/22 220 lb 6.4 oz (100 kg)  05/26/22 221 lb 9.6 oz (100.5 kg)  05/18/22 220 lb 11.2 oz (100.1 kg)  05/02/22 223 lb 3.2 oz (101.2 kg)  04/06/22 221 lb (100.2 kg)  03/03/22 225 lb 12.8 oz (102.4 kg)   Estimated body mass index is 34.95 kg/m as calculated from the following:   Height as of 03/08/23: 5\' 5"  (1.651 m).   Weight as of an earlier encounter on 03/13/23: 210 lb (95.3 kg).    Diabetes Self-Management Education - 03/13/23 1600       Visit Information   Visit Type Annual Follow-Up      Health Coping   How would you rate your overall health? Fair   working on getting a kidney transplant     Psychosocial Assessment   Patient Belief/Attitude about Diabetes Motivated to manage diabetes    What is the hardest part about your diabetes right now, causing you the most concern, or is the most worrisome to you about your diabetes?   Checking blood sugar    Self-care barriers Lack of material resources;Debilitated state due to current medical condition    Self-management support Doctor's office;Family;CDE visits    Other persons present Other (comment)   dietetic intern   Patient Concerns Glycemic Control    Special Needs None     Preferred Learning Style No preference indicated    Learning Readiness Ready    How often do you need to have someone help you when you read instructions, pamphlets, or other written materials from your doctor or pharmacy? 2 - Rarely    What is the last grade level you completed in school? 12      Pre-Education Assessment   Patient understands the diabetes disease and treatment process. Comprehends key points    Patient understands incorporating nutritional management into lifestyle. Comprehends key points    Patient undertands incorporating physical activity into lifestyle. Demonstrates understanding / competency    Patient understands using medications safely. Needs Review   would like help restarting her use of the Inpen   Patient understands monitoring blood glucose, interpreting and using results Needs Review   wants to use Dexcom G7   Patient understands prevention, detection, and treatment of acute complications. Comprehends key points    Patient understands prevention, detection, and treatment of chronic complications. Compreheands key points    Patient understands how to develop strategies to address psychosocial issues. Comprehends key points    Patient understands how to develop strategies to promote health/change behavior. Comprehends key points      Complications   Last HgB A1C per patient/outside source 7.8 %  How often do you check your blood sugar? 1-2 times/day    Fasting Blood glucose range (mg/dL) 16-109;604-540    Postprandial Blood glucose range (mg/dL) 981-191;478-295;>621    Number of hypoglycemic episodes per month 0    Number of hyperglycemic episodes ( >200mg /dL): Weekly    Can you tell when your blood sugar is high? No    Have you had a dilated eye exam in the past 12 months? No    Have you had a dental exam in the past 12 months? No    Are you checking your feet? Yes    How many days per week are you checking your feet? 7      Dietary Intake   Breakfast  deferred for today as this was not relavent to patients leanring needs   she states she has started eating more fruits and vegetables     Activity / Exercise   Activity / Exercise Type ADL's;Light (walking / raking leaves)   limied by her charcot foot     Patient Education   Previous Diabetes Education Yes (please comment)   here   Medications Other (comment)   am helping her to follow up on obtaining hr Inpen   Monitoring Taught/evaluated CGM (comment)   gave her a sample Dexcom G7 and showed her how to start it.     Individualized Goals (developed by patient)   Medications take my medication as prescribed    Monitoring  Consistenly use CGM      Post-Education Assessment   Patient understands using medications safely. Comphrehends key points    Patient understands monitoring blood glucose, interpreting and using results Comprehends key points      Outcomes   Expected Outcomes Demonstrated interest in learning. Expect positive outcomes    Future DMSE 4-6 wks    Program Status Completed      Subsequent Visit   Since your last visit have you continued or begun to take your medications as prescribed? Yes    Since your last visit have you had your blood pressure checked? Yes   had an episode at dialysis today of high blood pressure   Is your most recent blood pressure lower, unchanged, or higher since your last visit? Higher    Since your last visit have you experienced any weight changes? Loss    Weight Loss (lbs) 4    Since your last visit, are you checking your blood glucose at least once a day? No   stopped using dexcom            Individualized Plan for Diabetes Self-Management Training:   Learning Objective:  Patient will have a greater understanding of diabetes self-management. Patient education plan is to attend individual and/or group sessions per assessed needs and concerns.   Plan:   There are no Patient Instructions on file for this visit.  Expected Outcomes:   Demonstrated interest in learning. Expect positive outcomes  Education material provided: Diabetes Resources  If problems or questions, patient to contact team via:  Phone and Email  Future DSME appointment: 4-6 wks Norm Parcel, RD 03/13/2023 5:08 PM.

## 2023-03-13 NOTE — Progress Notes (Unsigned)
CC: routine follow up  HPI:  [name] is a 47 y.o.-year-old female with past medical history as below presenting for routine follow up.  Please see encounters tab for problem-based charting.  Past Medical History:  Diagnosis Date   Abnormal uterine bleeding (AUB)    Arthritis of knee    Left, Gel and cortisone injections @ [facility]   Asthma    prn inhaler   CAD S/P BMS PCI to prox LAD cardiologist--- dr Judithann Sauger LAD to Mid LAD lesion, 90% stenosed. Post intervention - Vision BMS 3.0 mm x 18 mm (~3.5 mm) there is a 0% residual stenosis. 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   left 2018; right 2019   Depression    Diabetic foot ulcer (HCC) 04/22/2019   Diabetic neuropathy (HCC)    feet   Diabetic retinopathy, nonproliferative, severe (HCC)    bilateral   Dyspnea    ESRD (end stage renal disease) on dialysis [facility])    ?chronic interstitial nephritis  ([facility]   H/O non-ST elevation myocardial infarction (NSTEMI) 03/2016   Found in 90% mid LAD lesion treated with bare-metal stent (BMS) PCI - vision BMS 3.0 mm x 18 mm   Heart murmur    History of MRSA infection 2007   Hyperlipidemia    Hypertension    IDA (iron deficiency anemia)    Insulin dependent type 2 diabetes mellitus (HCC)    Iron deficiency anemia    takes iron supplement   Myocardial infarction [facility])    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  Please see encounters tab for  problem based charting.  Physical Exam:  Vitals:   03/13/23 1525 03/13/23 1644  BP: (!) 171/98 (!) 176/81  Pulse: 99 96  Weight: 210 lb (95.3 kg)    General:Well-appearing, pleasant, In NAD Cardiac: RRR, no murmurs rubs or gallops. Minimal BLE edema Respiratory: Normal work of breathing on room air, CTAB Abdominal: Soft, nontender, nondistended   Assessment & Plan:   Irritable bowel syndrome (IBS) Concerned about frequent, immediate stools after she eats. She was preivously taking miralax but not anymore because it was making her stools too soft. She previously was also on reglan for presumed gastroparesis but not anymore and of note had negative gastric emptying study. Has bad crampy abd pain if she doesn't completely empty her bowels, with relief when she does. Of note she is taking at least 4 percocet a day and also has ESRD. Previously, IBS was diagnosed as constipation and diarrhea variant, but now seems to be more likely only diarrhea variant, though as above there are many variables that might factor into this. Would also consider pancreatic insufficiency and hepatobiliary pathology as causes of her symptoms if initial management of IBS is not successful. -bentyl 10 TID AC -cmp -consider adding immodium if diarrhea worsens -consider further workup with fecal elastase or RUQ  imaging if needed.  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 She also thinks the significant change in diet could be contributing to stomach issues, talked with Lupita Leash about cooking vegetables instead of eating raw vegetables. Now is working third shift at a hotel, is on her feet more. Has lost weight and is keeping it down. Is highly motivated to make lifestyle changes to improve avg glucose levels. She is trying to get a kidney transplant. Taking  tresiba 26 and humalog 6 TID. Took one dose of ozempic but wasn't able to get more. Had dexcom placed today. A1c improved to 7.8. -continue current insulin regimen -sent CGM and meter supplies -d/c ozempic -continue lifestyle modificaitons  CAD S/P BMS PCI to prox LAD Patient endorsed retrosternal achy 5/10 chest pain at end of visit. No nausea, radiation of pain. Not pleuritic and not reproducible with palpation. Mentions she has been having chest pain at rest occasionally. -EKG without any significant changes, chest pain resolved spontaneously  -follow up with cardiology   Essential hypertension MFW HD. Has had elevated BP at dialysis sessions. Also elevated today. Will need further antihypertensive titration with nephrology.  ESRD (end stage renal disease) on dialysis [facility]) Currently on MWF HD schedule. Not significantly volume overloaded today.  Healthcare maintenance Sent referral for colonoscopy   Patient discussed with Dr.  Lafonda Mosses

## 2023-03-13 NOTE — Assessment & Plan Note (Addendum)
Concerned about frequent, immediate stools after she eats. She was preivously taking miralax but not anymore because it was making her stools too soft. She previously was also on reglan for presumed gastroparesis but not anymore. Has bad crampy abd pain if she doesn't complete empty her bowels, with relief when she does. Of note she is taking at least 4 percocet a day and also has ESRD.

## 2023-03-14 NOTE — Assessment & Plan Note (Signed)
Patient endorsed retrosternal achy 5/10 chest pain at end of visit. No nausea, radiation of pain. Not pleuritic and not reproducible with palpation. Mentions she has been having chest pain at rest occasionally. -EKG without any significant changes, chest pain resolved spontaneously  -follow up with cardiology

## 2023-03-14 NOTE — Assessment & Plan Note (Signed)
Sent referral for colonoscopy

## 2023-03-14 NOTE — Assessment & Plan Note (Signed)
Currently on MWF HD schedule. Not significantly volume overloaded today.

## 2023-03-14 NOTE — Assessment & Plan Note (Signed)
MFW HD. Has had elevated BP at dialysis sessions. Also elevated today. Will need further antihypertensive titration with nephrology.

## 2023-03-15 ENCOUNTER — Other Ambulatory Visit (HOSPITAL_COMMUNITY): Payer: Self-pay

## 2023-03-15 ENCOUNTER — Other Ambulatory Visit: Payer: Self-pay | Admitting: Dietician

## 2023-03-15 ENCOUNTER — Other Ambulatory Visit: Payer: Self-pay

## 2023-03-15 ENCOUNTER — Telehealth: Payer: Self-pay

## 2023-03-15 DIAGNOSIS — Z794 Long term (current) use of insulin: Secondary | ICD-10-CM

## 2023-03-15 MED ORDER — INPEN 100-PINK-LILLY-HUMALOG DEVI
1 refills | Status: AC
Start: 2023-03-15 — End: ?
  Filled 2023-03-15 – 2023-04-13 (×2): qty 1, 30d supply, fill #0
  Filled 2023-11-09 (×2): qty 1, 30d supply, fill #1

## 2023-03-15 NOTE — Telephone Encounter (Signed)
Please send  Humalog Stephanie Coup to [facility] for her to use until she gets the Inpen. I am working to help her get the Inpen. Thank you

## 2023-03-15 NOTE — Telephone Encounter (Signed)
A Prior Authorization was initiated for this patients InPen through CoverMyMeds.   Key: [id number]

## 2023-03-15 NOTE — Progress Notes (Signed)
Internal Medicine Clinic Attending  Case discussed with Dr. Sridharan  At the time of the visit.  We reviewed the resident's history and exam and pertinent patient test results.  I agree with the assessment, diagnosis, and plan of care documented in the resident's note.  

## 2023-03-16 ENCOUNTER — Other Ambulatory Visit: Payer: Self-pay

## 2023-03-16 ENCOUNTER — Other Ambulatory Visit: Payer: Self-pay | Admitting: Cardiology

## 2023-03-16 ENCOUNTER — Other Ambulatory Visit (HOSPITAL_COMMUNITY): Payer: Self-pay

## 2023-03-16 MED ORDER — INSULIN LISPRO (1 UNIT DIAL) 100 UNIT/ML (KWIKPEN)
PEN_INJECTOR | SUBCUTANEOUS | 3 refills | Status: AC
Start: 2023-03-16 — End: ?
  Filled 2023-03-16: qty 15, 25d supply, fill #0
  Filled 2023-11-10: qty 3, 5d supply, fill #1

## 2023-03-16 MED ORDER — METOPROLOL TARTRATE 25 MG PO TABS
25.0000 mg | ORAL_TABLET | Freq: Two times a day (BID) | ORAL | 0 refills | Status: DC
  Filled 2023-03-16: qty 180, 90d supply, fill #0

## 2023-03-16 NOTE — Telephone Encounter (Signed)
Prior Auth for patients medication InPen approved by [employer] from 03/15/23 to 11/14/23.  CoverMyMeds Key: [id number] PA Case ID #: [id number]

## 2023-03-16 NOTE — Telephone Encounter (Signed)
Sent information to [employer].

## 2023-03-17 ENCOUNTER — Other Ambulatory Visit (HOSPITAL_COMMUNITY): Payer: Self-pay

## 2023-03-21 NOTE — Telephone Encounter (Addendum)
Call to patient regarding picking up her Inpen.Per [employer] [facility] pharmacy, they shipped it to her today and she should have it by the end of the week.

## 2023-03-22 ENCOUNTER — Telehealth: Payer: Self-pay | Admitting: Dietician

## 2023-03-22 ENCOUNTER — Encounter: Payer: Self-pay | Admitting: Gastroenterology

## 2023-03-22 NOTE — Telephone Encounter (Signed)
Patient returned my call to let us know she'll be getting her Inpen this week.   She states that she is coming in tomorrow to see a doctor because her blood sugar keeps dropping. She said in dialysis they checked using a finger stick and it was in the 60s. She feels the current insulin doses may be too much because she has made changes to the way she is eating (more vegetables, lean proteins) . I asked her what the lowest blood sugar she is comfortable with is and she stated 90.

## 2023-03-23 ENCOUNTER — Encounter: Payer: 59 | Admitting: Student

## 2023-03-24 ENCOUNTER — Emergency Department (HOSPITAL_BASED_OUTPATIENT_CLINIC_OR_DEPARTMENT_OTHER): Payer: 59 | Admitting: Radiology

## 2023-03-24 ENCOUNTER — Other Ambulatory Visit: Payer: Self-pay

## 2023-03-24 ENCOUNTER — Emergency Department (HOSPITAL_BASED_OUTPATIENT_CLINIC_OR_DEPARTMENT_OTHER): Payer: 59

## 2023-03-24 ENCOUNTER — Emergency Department (HOSPITAL_BASED_OUTPATIENT_CLINIC_OR_DEPARTMENT_OTHER)
Admission: EM | Admit: 2023-03-24 | Discharge: 2023-03-24 | Disposition: A | Discharge status: Home / Self Care | Payer: 59 | Attending: Emergency Medicine | Admitting: Emergency Medicine

## 2023-03-24 ENCOUNTER — Other Ambulatory Visit (HOSPITAL_COMMUNITY): Payer: Self-pay

## 2023-03-24 ENCOUNTER — Encounter (HOSPITAL_BASED_OUTPATIENT_CLINIC_OR_DEPARTMENT_OTHER): Payer: Self-pay | Admitting: *Deleted

## 2023-03-24 DIAGNOSIS — N186 End stage renal disease: Secondary | ICD-10-CM | POA: Insufficient documentation

## 2023-03-24 DIAGNOSIS — Z794 Long term (current) use of insulin: Secondary | ICD-10-CM | POA: Insufficient documentation

## 2023-03-24 DIAGNOSIS — Z992 Dependence on renal dialysis: Secondary | ICD-10-CM | POA: Insufficient documentation

## 2023-03-24 DIAGNOSIS — Z7951 Long term (current) use of inhaled steroids: Secondary | ICD-10-CM | POA: Insufficient documentation

## 2023-03-24 DIAGNOSIS — J45909 Unspecified asthma, uncomplicated: Secondary | ICD-10-CM | POA: Insufficient documentation

## 2023-03-24 DIAGNOSIS — I2510 Atherosclerotic heart disease of native coronary artery without angina pectoris: Secondary | ICD-10-CM | POA: Insufficient documentation

## 2023-03-24 DIAGNOSIS — I120 Hypertensive chronic kidney disease with stage 5 chronic kidney disease or end stage renal disease: Secondary | ICD-10-CM | POA: Insufficient documentation

## 2023-03-24 DIAGNOSIS — Z7982 Long term (current) use of aspirin: Secondary | ICD-10-CM | POA: Insufficient documentation

## 2023-03-24 DIAGNOSIS — Z79899 Other long term (current) drug therapy: Secondary | ICD-10-CM | POA: Diagnosis not present

## 2023-03-24 DIAGNOSIS — M25562 Pain in left knee: Secondary | ICD-10-CM | POA: Insufficient documentation

## 2023-03-24 DIAGNOSIS — E1122 Type 2 diabetes mellitus with diabetic chronic kidney disease: Secondary | ICD-10-CM | POA: Diagnosis not present

## 2023-03-24 MED ORDER — OXYCODONE-ACETAMINOPHEN 5-325 MG PO TABS
1.0000 | ORAL_TABLET | Freq: Once | ORAL | Status: AC
  Administered 2023-03-24: 1 via ORAL
  Filled 2023-03-24: qty 1

## 2023-03-24 MED ORDER — OXYCODONE-ACETAMINOPHEN 10-325 MG PO TABS
1.0000 | ORAL_TABLET | Freq: Every day | ORAL | 0 refills | Status: DC
  Filled 2023-03-29: qty 150, 30d supply, fill #0
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
## 2023-03-24 NOTE — Discharge Instructions (Addendum)
Please take tylenol/ibuprofen for pain. I recommend close follow-up with orthopedics for reevaluation.  Please do not hesitate to return to emergency department if worrisome signs symptoms we discussed become apparent.  
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
## 2023-03-29 ENCOUNTER — Other Ambulatory Visit (HOSPITAL_COMMUNITY): Payer: Self-pay

## 2023-03-30 ENCOUNTER — Other Ambulatory Visit (HOSPITAL_COMMUNITY): Payer: Self-pay

## 2023-04-12 ENCOUNTER — Ambulatory Visit (INDEPENDENT_AMBULATORY_CARE_PROVIDER_SITE_OTHER): Payer: 59 | Admitting: Student

## 2023-04-12 ENCOUNTER — Telehealth: Payer: Self-pay

## 2023-04-12 DIAGNOSIS — M1712 Unilateral primary osteoarthritis, left knee: Secondary | ICD-10-CM

## 2023-04-12 DIAGNOSIS — R789 Finding of unspecified substance, not normally found in blood: Secondary | ICD-10-CM | POA: Diagnosis not present

## 2023-04-12 NOTE — Assessment & Plan Note (Addendum)
Patient reports that while being evaulated by Cyndia Skeeters, some of her films were abnormal and prompted Dr. Linna Caprice to order rheumatologic work up. We are unable to see Dr. Kathline Magic notes or the [facility] work up today, but the patient states she has been told is can be associated with Lupus. For this reason, she has been referred to the rheumatologist, and is scheduled to be seen on 6/11.  Of note, this patient had rapid progression of ESRD since her initial diabetic nephropathy vs chronic interstitial nephritis based on biopsy. She was started on HD in Feb 2019. She reports that Dr. Signe Colt had wanted her to be seen by rheumatology then but she did not follow up.   Discussed with patient the need for rheumatology follow up now and expressed compassion towards her remorse for not finding this information now.  - Patient is to request [facility] laboratory results and notes, recent [facility] work up, and 6/11 rheumatology appointment records
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
## 2023-04-12 NOTE — Telephone Encounter (Signed)
Requesting to speak with a nurse about problem with Lupus, please call pt back.

## 2023-04-13 ENCOUNTER — Encounter: Payer: Self-pay | Admitting: Student

## 2023-04-13 ENCOUNTER — Other Ambulatory Visit (HOSPITAL_COMMUNITY): Payer: Self-pay

## 2023-04-13 ENCOUNTER — Telehealth: Payer: Self-pay | Admitting: *Deleted

## 2023-04-13 ENCOUNTER — Ambulatory Visit (INDEPENDENT_AMBULATORY_CARE_PROVIDER_SITE_OTHER): Payer: 59 | Admitting: Student

## 2023-04-13 VITALS — BP 132/84 | HR 90 | Temp 99.6°F | Ht 65.0 in | Wt 211.6 lb

## 2023-04-13 DIAGNOSIS — R789 Finding of unspecified substance, not normally found in blood: Secondary | ICD-10-CM

## 2023-04-13 DIAGNOSIS — I10 Essential (primary) hypertension: Secondary | ICD-10-CM | POA: Diagnosis not present

## 2023-04-13 NOTE — Patient Instructions (Addendum)
Thank you, Jane [name] for allowing [place] to provide your care today. Today we discussed your concerns for lupus.  -We will complete medical record release form today to send over to [facility] to get the lab results done recently. -I would call [facility] regarding rheumatology referral to [facility] or other facilities such as [facility].  -Try to avoid direct sunlight if there are concerns for facial rash.  -Continue following with your pain management clinic regarding chronic pains you are having.  -Plan to recheck A1c at your next visit.   Follow up:  1-2 months    Should you have any questions or concerns please call the internal medicine clinic at [phone].    Rana Snare, D.O. [facility]

## 2023-04-13 NOTE — Progress Notes (Signed)
Internal Medicine Clinic Attending  Case discussed with Dr. Gomez-Caraballo  At the time of the visit.  We reviewed the resident's history and exam and pertinent patient test results.  I agree with the assessment, diagnosis, and plan of care documented in the resident's note.  

## 2023-04-13 NOTE — Progress Notes (Signed)
CC: lupus  HPI:  [name] is a 47 y.o. female living with a history stated below and presents today for concern for lupus. Please see problem based assessment and plan for additional details.  Past Medical History:  Diagnosis Date   Abnormal uterine bleeding (AUB)    Arthritis of knee    Left, Gel and cortisone injections @ [facility]   Asthma    prn inhaler   CAD S/P BMS PCI to prox LAD cardiologist--- dr Judithann Sauger LAD to Mid LAD lesion, 90% stenosed. Post intervention - Vision BMS 3.0 mm x 18 mm (~3.5 mm) there is a 0% residual stenosis. 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   left 2018; right 2019   Depression    Diabetic foot ulcer (HCC) 04/22/2019   Diabetic neuropathy (HCC)    feet   Diabetic retinopathy, nonproliferative, severe (HCC)    bilateral   Dyspnea    ESRD (end stage renal disease) on dialysis [facility])    ?chronic interstitial nephritis  ([facility]   H/O non-ST elevation myocardial infarction (NSTEMI) 03/2016   Found in 90% mid LAD lesion treated with bare-metal stent (BMS) PCI - vision BMS 3.0 mm x 18 mm   Heart murmur    History of MRSA infection 2007   Hyperlipidemia    Hypertension    IDA (iron deficiency anemia)    Insulin dependent type 2 diabetes mellitus (HCC)    Iron deficiency anemia    takes iron supplement   Myocardial infarction [facility])    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 300 each 3   acetaminophen (TYLENOL) 500 MG tablet Take 1,000 mg by mouth every 6 (six) hours as needed (for pain.).     albuterol (PROVENTIL HFA) 108 (90 Base) MCG/ACT inhaler Inhale 2 puffs into the lungs every 4 (four) hours as needed for coughing, wheezing, or shortness of breath. 20.1 g 2   amLODipine (NORVASC) 5 MG tablet Take 1 tablet (5 mg total) by mouth every night 90 tablet 2   amLODipine-olmesartan (AZOR) 5-20 MG tablet Take 1 tablet by mouth every night 90 tablet 3   aspirin EC 81 MG tablet Take 1 tablet (81 mg total) by mouth daily. Swallow whole. 90 tablet 3   benzonatate (TESSALON PERLES) 100 MG capsule Take 2 capsules (200 mg total) by mouth 3 (three) times daily as needed for cough up to 14 days (Patient not taking: Reported on 03/08/2023) 20 capsule 0   blood glucose meter kit and supplies Dispense based on patient and insurance preference. Use up to four times daily as directed. (FOR ICD-10 E10.9, E11.9). 1 each 0   blood glucose meter kit and supplies Use in the morning, at noon, and  at bedtime. 1 each 0   Blood Glucose Monitoring Suppl (ACCU-CHEK GUIDE) w/Device KIT 1 each by Does not apply route 3 (three) times daily. 1 kit 0   cetirizine (ZYRTEC) 10 MG tablet Take 10 mg by mouth daily as needed for allergies.     cetirizine (ZYRTEC) 5 MG tablet Take 1 tablet (5 mg total) by mouth daily. 90 tablet 1   Cholecalciferol (VITAMIN D3) 50 MCG (2000 UT) CAPS Take 4 capsules (8,000 Units total) by mouth daily. 360 capsule 3   Continuous Glucose Receiver (DEXCOM G7 RECEIVER) DEVI Use  to continuously monitor your blood sugars. 1 each 0   Continuous Glucose Sensor (DEXCOM G7 SENSOR) MISC Use it for 10 days to continuously monitor your blood sugars. 9 each 3   dicyclomine (BENTYL) 10 MG capsule Take 1 capsule (10 mg total) by mouth 3 (three) times daily before meals.  90 capsule 0   fluticasone (FLONASE) 50 MCG/ACT nasal spray Place 2 sprays into both nostrils every night (Patient taking differently: Place 2 sprays into both nostrils at bedtime as needed for allergies.) 16 g 5   gabapentin (NEURONTIN) 100 MG capsule Take 1 capsule (100 mg total) by mouth at bedtime. 90 capsule 1   glucose blood (ACCU-CHEK GUIDE) test strip Use to check blood sugar up to 3 (three) times daily. 300 each 3   Glucose Blood (BLOOD GLUCOSE TEST STRIPS) STRP Check blood sugars fasting and then with each meal up to 4 times a day. 100 strip 0   guanFACINE (INTUNIV) 1 MG TB24 ER tablet Take 1 tablet (1 mg total) by mouth daily. 30 tablet 0   ibuprofen (ADVIL) 800 MG tablet Take 1 tablet (800 mg total) by mouth every 6 (six) hours as needed for pain. 11 tablet 0   icosapent Ethyl (VASCEPA) 1 g capsule Take 2 capsules (2 g total) by mouth 2 (two) times daily. 120 capsule 5   injection device for insulin (INPEN 100-PINK-LILLY-HUMALOG) DEVI Use to inject Humalog insulin for meals and correction insulin 1 each 1   insulin degludec (TRESIBA) 100 UNIT/ML FlexTouch Pen Inject 24 Units into the skin at bedtime. 15 mL 1   insulin lispro (HUMALOG KWIKPEN) 100 UNIT/ML KwikPen Use 6-20 units as directed before meals three times a day 15 mL 3   insulin lispro (HUMALOG) 100 UNIT/ML cartridge Inject 6-20 Units total into the skin 3 (three) times daily with meals. Use with inpen to inject at meal time and for correction insulin up to 40 units daily 15 mL 3   Insulin Pen Needle 32G X 4 MM MISC Use to inject insulin 4 (four) times daily. 400 each 3   Insulin Syringe-Needle U-100 31G X 15/64" 0.3 ML MISC Use to inject insulin daily 100 each 3   Lancet Device MISC Use 1 each in the morning, at noon, and at bedtime. 1 each 0   Lancets (ONETOUCH DELICA PLUS LANCET33G) MISC 1 each in the morning, at noon, and at bedtime. 100 each 0   lanthanum (FOSRENOL) 1000 MG chewable tablet Chew 1 tablet (1,000 mg total) by  mouth 3 (three) times daily with meals. 270 tablet 4   megestrol (MEGACE) 40 MG tablet Take 1 tablet (40 mg total) by mouth 2 (two) times daily. 60 tablet 6   metoCLOPramide (REGLAN) 5 MG tablet Take 1 tablet (5 mg total) by mouth every 8 (eight) hours as needed for nausea. 30 tablet 1   metoprolol tartrate (LOPRESSOR) 25 MG tablet Take 1  tablet (25 mg total) by mouth 2 (two) times daily. Do not take the night before dialysis, and take 2 hours after dialysis. 180 tablet 0   multivitamin (RENA-VIT) TABS tablet Take 1 tablet by mouth daily. 90 tablet 3   ondansetron (ZOFRAN) 4 MG tablet Take 1 tablet (4 mg total) by mouth every 8 (eight) hours as needed for nausea 30 tablet 11   ondansetron (ZOFRAN) 4 MG tablet Take 4 mg by mouth every 8 (eight) hours as needed for nausea.     oxyCODONE-acetaminophen (PERCOCET) 10-325 MG tablet Take 1 tablet by mouth 5 (five) times daily as needed 150 tablet 0   oxyCODONE-acetaminophen (PERCOCET) 10-325 MG tablet Take 1 tablet by mouth 5 (five) times daily as needed. 150 tablet 0   oxyCODONE-acetaminophen (PERCOCET) 10-325 MG tablet Take 1 tablet by mouth 5 times a day 150 tablet 0   oxyCODONE-acetaminophen (PERCOCET) 10-325 MG tablet Take 1 tablet by mouth 5 times daily as needed. 150 tablet 0   oxyCODONE-acetaminophen (PERCOCET) 10-325 MG tablet Take 1 tablet by mouth 5 (five) times daily as needed 150 tablet 0   polyethylene glycol (MIRALAX / GLYCOLAX) 17 g packet Take 17 g by mouth every other day.     rosuvastatin (CRESTOR) 40 MG tablet Take 1 tablet (40 mg total) by mouth daily. 90 tablet 3   senna-docusate (SENOKOT-S) 8.6-50 MG tablet Take 1 tablet by mouth daily. 90 tablet 2   sertraline (ZOLOFT) 25 MG tablet Take 1 tablet (25 mg total) by mouth daily. 30 tablet 1   sevelamer carbonate (RENVELA) 800 MG tablet Take 1,600-3,200 mg by mouth See admin instructions. Take 3200 mg with each meal and 1600 mg with each snack     Sodium Fluoride 1.1 % PSTE Brush teeth  with small amount 2 times a day 100 mL 5   [DISCONTINUED] Continuous Blood Gluc Transmit (DEXCOM G6 TRANSMITTER) MISC Use to check blood sugar at least 6 times a day 1 each 3   Current Facility-Administered Medications on File Prior to Visit  Medication Dose Route Frequency Provider Last Rate Last Admin   polyethylene glycol powder (GLYCOLAX/MIRALAX) container 255 g  1 Container Oral Once Crissie Sickles, MD       Review of Systems: ROS negative except for what is noted on the assessment and plan.  Vitals:   04/13/23 1358 04/13/23 1448  BP: (!) 156/75 132/84  Pulse: 94 90  Temp: 99.6 F (37.6 C)   TempSrc: Oral   SpO2: 100%   Weight: 211 lb 9.6 oz (96 kg)   Height: 5\' 5"  (1.651 m)    Physical Exam: Constitutional: alert, female sitting in wheelchair, in no acute distress Cardiovascular: regular rate Pulmonary/Chest: normal work of breathing on room air MSK: normal bulk and tone Neurological: alert & oriented x 3 Skin: warm and dry, chronic hypopigmentation around nares Psych: frustrated and anxious mood  Assessment & Plan:   Essential hypertension BP improved on repeat 132/84. Following with nephrology. HD MWF.   Abnormal hematology test result Presents today because she was not seen by rheumatology [facility] rheumatology) this morning. Dispute between patient and rheumatology office which led to her being excused from there. States ongoing facial rash noted chronic hypopigmentation around nares and concerns for lupus. Denies any new rash. States [facility] completed autoimmune workup last week due to findings of severe degenerative changes of left knee with concern for inflammatory arthritis. She states she was told she has lupus. Have not received any records. Notes from  orthopedics on care everywhere did not state labs were finalized/resulted. States she called [facility] and they were sending new referral to rheumatology at [facility]. Patient is frustrated by the situation. Expressed  empathy and discussed plan. Discussed supportive management at this time. Will have patient sign records release form today and await new referral.   Plan -medical record release form completed today -encourage patient to f/u on new rheumatology referral either at [facility] or other available facilities    Patient discussed with Dr. Rondall Allegra, D.O. [facility] Internal [facility], PGY-1 Phone: [phone] Date 04/13/2023 Time 8:37 PM

## 2023-04-13 NOTE — Assessment & Plan Note (Signed)
BP improved on repeat 132/84. Following with nephrology. HD MWF.

## 2023-04-13 NOTE — Assessment & Plan Note (Addendum)
Presents today because she was not seen by rheumatology [facility] rheumatology) this morning. Dispute between patient and rheumatology office which led to her being excused from there. States ongoing facial rash noted chronic hypopigmentation around nares and concerns for lupus. Denies any new rash. States [facility] completed autoimmune workup last week due to findings of severe degenerative changes of left knee with concern for inflammatory arthritis. She states she was told she has lupus. Have not received any records. Notes from orthopedics on care everywhere did not state labs were finalized/resulted. States she called [facility] and they were sending new referral to rheumatology at [facility]. Patient is frustrated by the situation. Expressed empathy and discussed plan. Discussed supportive management at this time. Will have patient sign records release form today and await new referral.   Plan -medical record release form completed today -encourage patient to f/u on new rheumatology referral either at [facility] or other available facilities

## 2023-04-13 NOTE — Telephone Encounter (Signed)
Returned call to patient. States she thinks she's having a lupus flare: butterfly rash, left knee inflamed, "itching really bad." She was able to schedule appt today at 1130 with [facility].

## 2023-04-13 NOTE — Telephone Encounter (Signed)
Call from patient states is having a butterfly like rash on her face.  Very uncomfortable.  Requesting an appointment to be seen for a Gout flare up..  Given an appointment for this afternoon.

## 2023-04-14 ENCOUNTER — Other Ambulatory Visit (HOSPITAL_COMMUNITY): Payer: Self-pay

## 2023-04-15 ENCOUNTER — Other Ambulatory Visit: Payer: Self-pay

## 2023-04-15 ENCOUNTER — Emergency Department (HOSPITAL_COMMUNITY)
Admission: EM | Admit: 2023-04-15 | Discharge: 2023-04-16 | Disposition: A | Discharge status: Home / Self Care | Payer: 59 | Attending: Emergency Medicine | Admitting: Emergency Medicine

## 2023-04-15 ENCOUNTER — Encounter (HOSPITAL_COMMUNITY): Payer: Self-pay | Admitting: Emergency Medicine

## 2023-04-15 DIAGNOSIS — R21 Rash and other nonspecific skin eruption: Secondary | ICD-10-CM | POA: Diagnosis not present

## 2023-04-15 DIAGNOSIS — Z7982 Long term (current) use of aspirin: Secondary | ICD-10-CM | POA: Insufficient documentation

## 2023-04-15 DIAGNOSIS — Z794 Long term (current) use of insulin: Secondary | ICD-10-CM | POA: Insufficient documentation

## 2023-04-15 DIAGNOSIS — N186 End stage renal disease: Secondary | ICD-10-CM | POA: Insufficient documentation

## 2023-04-15 DIAGNOSIS — J45909 Unspecified asthma, uncomplicated: Secondary | ICD-10-CM | POA: Diagnosis not present

## 2023-04-15 DIAGNOSIS — N938 Other specified abnormal uterine and vaginal bleeding: Secondary | ICD-10-CM | POA: Insufficient documentation

## 2023-04-15 DIAGNOSIS — M2559 Pain in other specified joint: Secondary | ICD-10-CM

## 2023-04-15 DIAGNOSIS — Z992 Dependence on renal dialysis: Secondary | ICD-10-CM | POA: Insufficient documentation

## 2023-04-15 DIAGNOSIS — I120 Hypertensive chronic kidney disease with stage 5 chronic kidney disease or end stage renal disease: Secondary | ICD-10-CM | POA: Insufficient documentation

## 2023-04-15 DIAGNOSIS — E1122 Type 2 diabetes mellitus with diabetic chronic kidney disease: Secondary | ICD-10-CM | POA: Diagnosis not present

## 2023-04-15 DIAGNOSIS — M2550 Pain in unspecified joint: Secondary | ICD-10-CM | POA: Diagnosis not present

## 2023-04-15 DIAGNOSIS — Z79899 Other long term (current) drug therapy: Secondary | ICD-10-CM | POA: Insufficient documentation

## 2023-04-15 DIAGNOSIS — N939 Abnormal uterine and vaginal bleeding, unspecified: Secondary | ICD-10-CM

## 2023-04-15 LAB — CBC
HCT: 35.7 % — ABNORMAL LOW (ref 36.0–46.0)
Hemoglobin: 10.8 g/dL — ABNORMAL LOW (ref 12.0–15.0)
MCH: 26 pg (ref 26.0–34.0)
MCHC: 30.3 g/dL (ref 30.0–36.0)
MCV: 86 fL (ref 80.0–100.0)
Platelets: 176 10*3/uL (ref 150–400)
RBC: 4.15 MIL/uL (ref 3.87–5.11)
RDW: 15.9 % — ABNORMAL HIGH (ref 11.5–15.5)
WBC: 6.7 10*3/uL (ref 4.0–10.5)
nRBC: 0 % (ref 0.0–0.2)

## 2023-04-15 LAB — BASIC METABOLIC PANEL
Anion gap: 15 (ref 5–15)
BUN: 39 mg/dL — ABNORMAL HIGH (ref 6–20)
CO2: 27 mmol/L (ref 22–32)
Calcium: 8 mg/dL — ABNORMAL LOW (ref 8.9–10.3)
Chloride: 91 mmol/L — ABNORMAL LOW (ref 98–111)
Creatinine, Ser: 10.1 mg/dL — ABNORMAL HIGH (ref 0.44–1.00)
GFR, Estimated: 4 mL/min — ABNORMAL LOW (ref >60)
Glucose, Bld: 338 mg/dL — ABNORMAL HIGH (ref 70–99)
Potassium: 4.4 mmol/L (ref 3.5–5.1)
Sodium: 133 mmol/L — ABNORMAL LOW (ref 135–145)

## 2023-04-15 LAB — I-STAT BETA HCG BLOOD, ED (MC, WL, AP ONLY): I-stat hCG, quantitative: <5 m[IU]/mL (ref <5)

## 2023-04-15 NOTE — ED Triage Notes (Addendum)
Pt in with c/o possible lupus flare. Pt is c/o joint pain, BLE swelling, itchy skin and dry eyes. Pt also reporting heavy vaginal bleeding and abdominal pain for the past few days. Pt also does MWF dialysis - last session Friday

## 2023-04-16 ENCOUNTER — Encounter: Payer: Self-pay | Admitting: Obstetrics & Gynecology

## 2023-04-16 NOTE — Discharge Instructions (Addendum)
Follow up with your primary care provider. Consider requesting a copy of your labs in person from your orthopedic specialist.  Follow up with GYN for vaginal bleeding.  Your labs today are stable (no significant changes)- CBC, BMP, negative pregnancy test.
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
## 2023-04-16 NOTE — ED Notes (Signed)
I could not take the pt chart out of the computer because  the pt did not come from a facility  but th computer throught she did

## 2023-04-17 ENCOUNTER — Other Ambulatory Visit: Payer: Self-pay

## 2023-04-17 ENCOUNTER — Other Ambulatory Visit (HOSPITAL_COMMUNITY): Payer: Self-pay

## 2023-04-17 DIAGNOSIS — M329 Systemic lupus erythematosus, unspecified: Secondary | ICD-10-CM

## 2023-04-17 NOTE — Progress Notes (Signed)
Internal Medicine Clinic Attending  Case discussed with Dr. Patel  At the time of the visit.  We reviewed the resident's history and exam and pertinent patient test results.  I agree with the assessment, diagnosis, and plan of care documented in the resident's note.  

## 2023-04-17 NOTE — Telephone Encounter (Signed)
Pt seen on  04/13/2023 requesting a referral to a different Rheumatologist. Per OV notes 04/13/2023 pt is to f/u with a Rheumatologist .  The pt will require a Referral in order to be seen by a New rheumatologist.  Pt recently seen in the Ed on 04/15/2023 with another rash flare up.  Pt does not won't to go to a [facility] due to the long wait time.  Pt states she was seen @ [facility] (04/04/2023 0 and told to f/u with a Rheumatologist as soon as possible.   [facility] notes are already in Care Everywhere.  Pt also  recently had an appt with [facility]/but was late and cannot resch an appt with their office.    Can a Referral be placed?

## 2023-04-18 ENCOUNTER — Encounter: Payer: Self-pay | Admitting: *Deleted

## 2023-04-19 ENCOUNTER — Ambulatory Visit (INDEPENDENT_AMBULATORY_CARE_PROVIDER_SITE_OTHER): Payer: 59 | Admitting: Obstetrics & Gynecology

## 2023-04-19 ENCOUNTER — Encounter: Payer: Self-pay | Admitting: Obstetrics & Gynecology

## 2023-04-19 VITALS — BP 155/74 | HR 89 | Wt 210.0 lb

## 2023-04-19 DIAGNOSIS — N939 Abnormal uterine and vaginal bleeding, unspecified: Secondary | ICD-10-CM

## 2023-04-19 NOTE — Progress Notes (Signed)
History:  47 y.o. [id number] here today for f/u of AUB. Pt would like to proceed with hysterectomy. She has some new medical problems that need to be addressed first. She wishes to cont with the Megace. No new sx.      The following portions of the patient's history were reviewed and updated as appropriate: allergies, current medications, past family history, past medical history, past social history, past surgical history and problem list.  Review of Systems:  Pertinent items are noted in HPI.    Objective:  Physical Exam Weight 210 lb (95.3 kg). BP (!) 155/74   Pulse 89   Wt 210 lb (95.3 kg)   BMI 34.95 kg/m   CONSTITUTIONAL: Well-developed, well-nourished female in no acute distress.  HENT:  Normocephalic, atraumatic EYES: Conjunctivae and EOM are normal. No scleral icterus.  NECK: Normal range of motion SKIN: Skin is warm and dry. No rash noted. Not diaphoretic.No pallor. NEUROLGIC: Alert and oriented to person, place, and time. Normal coordination.  Pelvic: not done  Labs and Imaging 12/21/2021 FINAL MICROSCOPIC DIAGNOSIS:   A. ENDOMETRIUM, CURETTAGE:  Chronic endometritis  Marked progestational effect  Stromal breakdown consistent with bleeding  Negative for polyp, hyperplasia and carcinoma   GROSS DESCRIPTION:   Received in formalin are 2.5 x 2.2 x 0.5 cm of soft tan-red tissue and  clotted blood.  The specimen is entirely submitted in 2 cassettes.  [facility]  12/21/2021)  Assessment & Plan:  Jilliane was seen today for follow-up.  Diagnoses and all orders for this visit:  Abnormal uterine bleeding (AUB)  Keep Megace 40mg  bid.  Will refer pt to Dr. Briscoe Deutscher for possible RATH.   Dravon Nott L. Harraway-Smith, M.D., [facility]

## 2023-04-27 ENCOUNTER — Other Ambulatory Visit (HOSPITAL_COMMUNITY): Payer: Self-pay

## 2023-04-27 MED ORDER — OXYCODONE-ACETAMINOPHEN 10-325 MG PO TABS
1.0000 | ORAL_TABLET | Freq: Every day | ORAL | 0 refills | Status: DC
  Filled 2023-04-27 – 2023-04-28 (×2): qty 150, 30d supply, fill #0

## 2023-04-28 ENCOUNTER — Other Ambulatory Visit (HOSPITAL_COMMUNITY): Payer: Self-pay

## 2023-05-08 ENCOUNTER — Encounter: Payer: Self-pay | Admitting: Obstetrics & Gynecology

## 2023-05-09 ENCOUNTER — Telehealth: Payer: Self-pay

## 2023-05-09 NOTE — Telephone Encounter (Signed)
   Pre-operative Risk Assessment    Patient Name: Jane [name]  DOB: 07/29/1976 MRN: [id number]      Request for Surgical Clearance    Procedure:   LEFT TOTAL KNEE ARTHROPLASTY  Date of Surgery:  Clearance TBD                                 Surgeon:  DR. Samson Frederic Surgeon's Group or Practice Name:  [facility] Phone number:  [phone] Fax number:  [phone] ATTN: KERRI MAZE   Type of Clearance Requested:   - Medical  - Pharmacy:  Hold Aspirin NEEDS INSTRUCTION IF OR WHEN TO HOLD   Type of Anesthesia:  Spinal   Additional requests/questions:    SignedMichaelle Copas   05/09/2023, 10:32 AM

## 2023-05-09 NOTE — Telephone Encounter (Signed)
Spoke with patient who is agreeable to do a in office visit with Edd Fabian, NP on 7/11 at 8:50 am. I will fax over cardiac clearance updates to requesting provider's office.

## 2023-05-09 NOTE — Telephone Encounter (Signed)
    Primary Cardiologist:David Herbie [place], MD  Chart reviewed as part of pre-operative protocol coverage. Because of Arturo L Jeon's past medical history and time since last visit, he/she will require a follow-up visit in order to better assess preoperative cardiovascular risk.  Pre-op covering staff: - Please schedule  Office appointment and call patient to inform them. - Please contact requesting surgeon's office via preferred method (i.e, phone, fax) to inform them of need for appointment prior to surgery.  If applicable, this message will also be routed to pharmacy pool and/or primary cardiologist for input on holding anticoagulant/antiplatelet agent as requested below so that this information is available at time of patient's appointment.   Ronney Asters, NP  05/09/2023, 11:07 AM

## 2023-05-11 ENCOUNTER — Other Ambulatory Visit (HOSPITAL_COMMUNITY): Payer: Self-pay

## 2023-05-11 ENCOUNTER — Ambulatory Visit (INDEPENDENT_AMBULATORY_CARE_PROVIDER_SITE_OTHER): Payer: 59 | Admitting: Student

## 2023-05-11 ENCOUNTER — Encounter (HOSPITAL_COMMUNITY): Payer: Self-pay

## 2023-05-11 DIAGNOSIS — J069 Acute upper respiratory infection, unspecified: Secondary | ICD-10-CM

## 2023-05-11 MED ORDER — BENZONATATE 100 MG PO CAPS
100.0000 mg | ORAL_CAPSULE | Freq: Two times a day (BID) | ORAL | 0 refills | Status: AC
  Filled 2023-05-11 – 2023-05-24 (×2): qty 14, 7d supply, fill #0

## 2023-05-11 NOTE — Assessment & Plan Note (Signed)
Pt presents with chief complaint of nasal congestion.  She states on Monday she started developing a cough, and Tuesday started to become worse in terms of intensity.  She also states she has intermittent sputum production that is green.  Wednesday she started to develop a sore throat, and last night she felt feverish with sweats.  She states her grandson attends daycare, and he has been around the house frequently and is frequently sick.  She denies any shortness of breath, chest pain, nausea, vomiting, diarrhea.  Of note, she does have a history of end-stage renal disease on hemodialysis she has not missed any sessions, it is less short of breath.  She has been vaccinated x 2 for COVID.  She does not feel as if she is volume overloaded.  Symptoms seem consistent with upper respiratory tract infection, reassuring that she is not having any chest pain or shortness of breath.  However, she is immunocompromise given her end-stage renal disease so informed patient that if symptoms persist or symptoms start to worsen she may need to be further evaluated with chest x-ray to rule out pneumonia.  Plan: - Tessalon perles for cough - Recommended guaifenesin to help with mucus clearance - Informed patient to take a COVID test and let us know what the results are

## 2023-05-11 NOTE — Progress Notes (Signed)
CC: Congestion  This is a telephone encounter between [name] and Aino Heckert on 05/11/2023 for congestion. The visit was conducted with the patient located at home and Christine Marvin Grabill at [facility]. The patient's identity was confirmed using their DOB and current address. The patient has consented to being evaluated through a telephone encounter and understands the associated risks (an examination cannot be done and the patient may need to come in for an appointment) / benefits (allows the patient to remain at home, decreasing exposure to coronavirus). I personally spent 15 minutes on medical discussion.   HPI:  [name] is a 47 y.o. with PMH as below.   Please see A&P for assessment of the patient's acute and chronic medical conditions.   Past Medical History:  Diagnosis Date   Abnormal uterine bleeding (AUB)    Arthritis of knee    Left, Gel and cortisone injections @ [facility]   Asthma    prn inhaler   CAD S/P BMS PCI to prox LAD cardiologist--- dr Judithann Sauger LAD to Mid LAD lesion, 90% stenosed. Post intervention - Vision BMS 3.0 mm x 18 mm (~3.5 mm) there is a 0% residual stenosis. 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   left 2018; right 2019   Depression    Diabetic foot ulcer (HCC) 04/22/2019   Diabetic neuropathy (HCC)    feet   Diabetic retinopathy, nonproliferative, severe (HCC)    bilateral   Dyspnea    ESRD (end stage renal disease) on dialysis [facility])    ?chronic interstitial nephritis  ([facility]   H/O non-ST elevation myocardial infarction (NSTEMI) 03/2016   Found in 90% mid LAD lesion treated with bare-metal stent (BMS) PCI - vision BMS 3.0 mm x 18 mm   Heart murmur    History of MRSA infection 2007   Hyperlipidemia    Hypertension    IDA (iron deficiency anemia)    Insulin dependent type 2 diabetes mellitus (HCC)    Iron  deficiency anemia    takes iron supplement   Myocardial infarction [facility])    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  She states on Monday she started developing a cough, and Tuesday started to become worse in terms of intensity.  She also states she has intermittent sputum production that is green.  Wednesday she started to develop a sore throat, and last night she felt feverish with sweats.  She states her grandson attends daycare, and he has been around the house frequently and is frequently sick.  She denies any shortness of breath, chest pain, nausea, vomiting, diarrhea.  Of note, she does have a history of end-stage renal disease on hemodialysis she has not missed any sessions, it is  less short of breath.  She has been vaccinated x 2 for COVID.  She does not feel as if she is volume overloaded.  Symptoms seem consistent with upper respiratory tract infection, reassuring that she is not having any chest pain or shortness of breath.  However, she is immunocompromise given her end-stage renal disease so informed patient that if symptoms persist or symptoms start to worsen she may need to be further evaluated with chest x-ray to rule out pneumonia.  Plan: - Tessalon perles for cough - Recommended guaifenesin to help with mucus clearance - Informed patient to take a COVID test and let us know what the results are   Patient  discussed with Dr. Maryagnes Amos Hairo Garraway Internal Medicine Resident

## 2023-05-12 NOTE — Progress Notes (Signed)
Internal Medicine Clinic Attending  Case discussed with Dr. Nooruddin  At the time of the visit.  We reviewed the resident's history and exam and pertinent patient test results.  I agree with the assessment, diagnosis, and plan of care documented in the resident's note.  

## 2023-05-16 ENCOUNTER — Other Ambulatory Visit (HOSPITAL_COMMUNITY): Payer: Self-pay

## 2023-05-16 MED ORDER — LEFLUNOMIDE 10 MG PO TABS
10.0000 mg | ORAL_TABLET | Freq: Every day | ORAL | 2 refills | Status: DC
  Filled 2023-05-16 – 2023-05-24 (×2): qty 30, 30d supply, fill #0
  Filled 2023-06-27: qty 30, 30d supply, fill #1

## 2023-05-19 ENCOUNTER — Other Ambulatory Visit (HOSPITAL_COMMUNITY): Payer: Self-pay

## 2023-05-22 ENCOUNTER — Other Ambulatory Visit (HOSPITAL_COMMUNITY): Payer: Self-pay

## 2023-05-23 ENCOUNTER — Telehealth (HOSPITAL_COMMUNITY): Payer: Self-pay | Admitting: *Deleted

## 2023-05-23 ENCOUNTER — Other Ambulatory Visit (HOSPITAL_COMMUNITY): Payer: Self-pay

## 2023-05-23 NOTE — Telephone Encounter (Signed)
Received fax from Dr Paulene Floor requesting evaluation for revision of R BCF due to pain and prolonged bleeding. Will give to [facility].

## 2023-05-24 ENCOUNTER — Encounter (HOSPITAL_COMMUNITY): Payer: Self-pay

## 2023-05-24 ENCOUNTER — Other Ambulatory Visit (HOSPITAL_COMMUNITY): Payer: Self-pay

## 2023-05-25 ENCOUNTER — Ambulatory Visit: Payer: 59 | Admitting: General Practice

## 2023-05-29 ENCOUNTER — Other Ambulatory Visit (HOSPITAL_COMMUNITY): Payer: Self-pay

## 2023-05-29 MED ORDER — OXYCODONE-ACETAMINOPHEN 10-325 MG PO TABS
1.0000 | ORAL_TABLET | Freq: Every day | ORAL | 0 refills | Status: DC
  Filled 2023-05-29: qty 150, 30d supply, fill #0

## 2023-06-07 ENCOUNTER — Other Ambulatory Visit: Payer: Self-pay | Admitting: *Deleted

## 2023-06-07 DIAGNOSIS — N186 End stage renal disease: Secondary | ICD-10-CM
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
## 2023-06-14 ENCOUNTER — Telehealth: Payer: Self-pay | Admitting: Cardiology

## 2023-06-14 ENCOUNTER — Ambulatory Visit: Payer: 59 | Attending: General Practice | Admitting: Cardiology

## 2023-06-14 ENCOUNTER — Encounter: Payer: Self-pay | Admitting: General Practice

## 2023-06-14 ENCOUNTER — Other Ambulatory Visit (HOSPITAL_COMMUNITY): Payer: Self-pay

## 2023-06-14 VITALS — BP 138/86 | HR 90 | Ht 65.0 in | Wt 208.6 lb

## 2023-06-14 DIAGNOSIS — I110 Hypertensive heart disease with heart failure: Secondary | ICD-10-CM | POA: Diagnosis not present

## 2023-06-14 DIAGNOSIS — I2510 Atherosclerotic heart disease of native coronary artery without angina pectoris: Secondary | ICD-10-CM | POA: Diagnosis not present

## 2023-06-14 DIAGNOSIS — Z9861 Coronary angioplasty status: Secondary | ICD-10-CM

## 2023-06-14 DIAGNOSIS — I5032 Chronic diastolic (congestive) heart failure: Secondary | ICD-10-CM | POA: Diagnosis not present

## 2023-06-14 DIAGNOSIS — E785 Hyperlipidemia, unspecified: Secondary | ICD-10-CM

## 2023-06-14 DIAGNOSIS — Z01810 Encounter for preprocedural cardiovascular examination: Secondary | ICD-10-CM | POA: Diagnosis not present

## 2023-06-14 DIAGNOSIS — I779 Disorder of arteries and arterioles, unspecified: Secondary | ICD-10-CM

## 2023-06-14 DIAGNOSIS — E781 Pure hyperglyceridemia: Secondary | ICD-10-CM

## 2023-06-14 MED ORDER — ICOSAPENT ETHYL 1 G PO CAPS
2.0000 g | ORAL_CAPSULE | Freq: Two times a day (BID) | ORAL | 1 refills | Status: DC
Start: 2023-06-14 — End: 2024-03-29
  Filled 2023-06-14: qty 120, 30d supply, fill #0

## 2023-06-14 MED ORDER — ROSUVASTATIN CALCIUM 40 MG PO TABS
40.0000 mg | ORAL_TABLET | Freq: Every day | ORAL | 1 refills | Status: DC
Start: 2023-06-14 — End: 2024-03-29
  Filled 2023-06-14: qty 90, 90d supply, fill #0

## 2023-06-14 NOTE — Telephone Encounter (Signed)
Patient needing orders for LE ABI test that Reather Littler, NP requested patient have done. She states that [facility] stated they would need order.

## 2023-06-14 NOTE — Patient Instructions (Signed)
Medication Instructions:  The current medical regimen is effective;  continue present plan and medications as directed. Please refer to the Current Medication list given to you today.  *If you need a refill on your cardiac medications before your next appointment, please call your pharmacy*  Lab Work: LIPID TODAY If you have labs (blood work) drawn today and your tests are completely normal, you will receive your results only by:  MyChart Message (if you have MyChart) OR  A paper copy in the mail If you have any lab test that is abnormal or we need to change your treatment, we will call you to review the results.  Testing/Procedures: CALL AND CHECK WITH [facility] FOR LOWER EXTREMITY ABI's  Follow-Up: At [facility], you and your health needs are our priority.  As part of our continuing mission to provide you with exceptional heart care, we have created designated Provider Care Teams.  These Care Teams include your primary Cardiologist (physician) and Advanced Practice Providers (APPs -  Physician Assistants and Nurse Practitioners) who all work together to provide you with the care you need, when you need it.  We recommend signing up for the patient portal called "MyChart".  Sign up information is provided on this After Visit Summary.  MyChart is used to connect with patients for Virtual Visits (Telemedicine).  Patients are able to view lab/test results, encounter notes, upcoming appointments, etc.  Non-urgent messages can be sent to your provider as well.   To learn more about what you can do with MyChart, go to [email or web address].    Your next appointment:   6 month(s)  Provider:   Bryan Lemma, MD     Other Instructions

## 2023-06-14 NOTE — Telephone Encounter (Signed)
Patient went to [facility] and they stated she would need an order for ABI if you wanted her to have one? Would you like to order?

## 2023-06-14 NOTE — Telephone Encounter (Signed)
Returned pt call to let her know to expect a call from scheduling LVM   Per K. West see below.  Please order bilateral lower extremity ABIs with lower extremity arterials to be completed by [facility]. Thanks!

## 2023-06-16 ENCOUNTER — Ambulatory Visit (INDEPENDENT_AMBULATORY_CARE_PROVIDER_SITE_OTHER): Payer: 59 | Admitting: Obstetrics and Gynecology

## 2023-06-16 ENCOUNTER — Encounter: Payer: Self-pay | Admitting: Obstetrics and Gynecology

## 2023-06-16 ENCOUNTER — Other Ambulatory Visit (HOSPITAL_COMMUNITY): Payer: Self-pay

## 2023-06-16 VITALS — BP 150/66 | HR 87 | Ht 65.0 in | Wt 207.0 lb

## 2023-06-16 DIAGNOSIS — N898 Other specified noninflammatory disorders of vagina: Secondary | ICD-10-CM | POA: Diagnosis not present

## 2023-06-16 DIAGNOSIS — N939 Abnormal uterine and vaginal bleeding, unspecified: Secondary | ICD-10-CM | POA: Diagnosis not present

## 2023-06-16 MED ORDER — ESTRADIOL 0.1 MG/GM VA CREA
1.0000 | TOPICAL_CREAM | Freq: Every day | VAGINAL | 4 refills | Status: DC
Start: 2023-06-16 — End: 2024-03-29
  Filled 2023-06-16: qty 42.5, 10d supply, fill #0
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
## 2023-06-16 NOTE — Progress Notes (Unsigned)
Patient presents for surgical consult.  Patient has had bleeding since July 5th, 2024  Patient is also currently suffering from left knee fracture. Armandina Stammer RN

## 2023-06-20 ENCOUNTER — Other Ambulatory Visit (HOSPITAL_BASED_OUTPATIENT_CLINIC_OR_DEPARTMENT_OTHER): Payer: 59

## 2023-06-20 ENCOUNTER — Ambulatory Visit: Payer: 59 | Admitting: Gastroenterology

## 2023-06-21 ENCOUNTER — Other Ambulatory Visit (HOSPITAL_COMMUNITY): Payer: Self-pay

## 2023-06-21 ENCOUNTER — Telehealth: Payer: Self-pay | Admitting: *Deleted

## 2023-06-21 ENCOUNTER — Ambulatory Visit (INDEPENDENT_AMBULATORY_CARE_PROVIDER_SITE_OTHER): Payer: 59 | Admitting: Internal Medicine

## 2023-06-21 DIAGNOSIS — R110 Nausea: Secondary | ICD-10-CM

## 2023-06-21 MED ORDER — PROMETHAZINE HCL 12.5 MG PO TABS
12.5000 mg | ORAL_TABLET | Freq: Four times a day (QID) | ORAL | 0 refills | Status: DC | PRN
  Filled 2023-06-21: qty 20, 5d supply, fill #0

## 2023-06-21 NOTE — Progress Notes (Signed)
[facility] Internal Medicine Residency Telephone Encounter Continuity Care Appointment  HPI:  This telephone encounter was created for [name] on 06/21/2023 for the following purpose/cc: nausea.   Past Medical History:  Past Medical History:  Diagnosis Date   Abnormal uterine bleeding (AUB)    Arthritis of knee    Left, Gel and cortisone injections @ [facility]   Asthma    prn inhaler   CAD S/P BMS PCI to prox LAD cardiologist--- dr Judithann Sauger LAD to Mid LAD lesion, 90% stenosed. Post intervention - Vision BMS 3.0 mm x 18 mm (~3.5 mm) there is a 0% residual stenosis. 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   left 2018; right 2019   Depression    Diabetic foot ulcer (HCC) 04/22/2019   Diabetic neuropathy (HCC)    feet   Diabetic retinopathy, nonproliferative, severe (HCC)    bilateral   Dyspnea    ESRD (end stage renal disease) on dialysis [facility])    ?chronic interstitial nephritis  ([facility]   H/O non-ST elevation myocardial infarction (NSTEMI) 03/2016   Found in 90% mid LAD lesion treated with bare-metal stent (BMS) PCI - vision BMS 3.0 mm x 18 mm   Heart murmur    History of MRSA infection 2007   Hyperlipidemia    Hypertension    IDA (iron deficiency anemia)    Insulin dependent type 2 diabetes mellitus (HCC)    Iron deficiency anemia    takes iron supplement   Myocardial infarction [facility])    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   Assessment / Plan / Recommendations:  Nausea in adult Telehealth visit with patient today due to nausea. She explains that she felt ill yesterday with nausea as her predominant symptom and went to sleep due to feeling unwell. When she woke up she was also experiencing diarrhea. She has not vomited. No known sick contacts. She states her diarrhea has improved some and she is getting imodium for further symptom control.   In the past reglan and promethazine have helped her nausea more than zofran and of the two she prefers promethazine. Qtc 07/30 was 455.  Plan:Promethazine 12.5 mg PO q6h PRN nausea sent in. She has a visit on 08/12 with Dr. Hessie Diener already scheduled and we will follow up on her symptoms at that time.  Please see A&P under problem oriented charting for assessment of the patient's acute and chronic medical conditions.  As always, pt is advised that if symptoms worsen or new symptoms arise, they should go to an urgent care facility or to to ER for further evaluation.   Consent and Medical Decision Making:  Patient discussed with Dr. Criselda Peaches This is a telephone encounter between Jane [name] and Champ Mungo on 06/21/2023 for nausea. The visit was conducted with the patient located at home and Champ Mungo at [facility]  The patient's identity was confirmed using their DOB and current address. The patient has consented to being evaluated through a telephone encounter and understands the associated risks (an examination cannot be done and the patient may need to come in for an appointment) / benefits (allows the patient to remain at home, decreasing exposure to coronavirus). I personally spent 10 minutes on medical discussion.

## 2023-06-21 NOTE — Telephone Encounter (Signed)
Received a call from pt who's currently at dialysis. C/o nausea - stated the nurses at dialysis told they had no one there to write a rx. Pt stated Zofran does not help; but she has taken Reglan and Promethazine before. Telehealth appt schedule for today 8/7@ 1445 PM with Dr August Saucer.

## 2023-06-21 NOTE — Assessment & Plan Note (Addendum)
Telehealth visit with patient today due to nausea. She explains that she felt ill yesterday with nausea as her predominant symptom and went to sleep due to feeling unwell. When she woke up she was also experiencing diarrhea. She has not vomited. No known sick contacts. She states her diarrhea has improved some and she is getting imodium for further symptom control.   In the past reglan and promethazine have helped her nausea more than zofran and of the two she prefers promethazine. Qtc 07/30 was 455.  Plan:Promethazine 12.5 mg PO q6h PRN nausea sent in. She has a visit on 08/12 with Dr. Hessie Diener already scheduled and we will follow up on her symptoms at that time.

## 2023-06-22 ENCOUNTER — Other Ambulatory Visit (HOSPITAL_COMMUNITY): Payer: Self-pay

## 2023-06-22 ENCOUNTER — Ambulatory Visit (HOSPITAL_BASED_OUTPATIENT_CLINIC_OR_DEPARTMENT_OTHER)
Admission: RE | Admit: 2023-06-22 | Discharge: 2023-06-22 | Disposition: A | Discharge status: Home / Self Care | Payer: 59 | Source: Ambulatory Visit | Attending: Obstetrics and Gynecology | Admitting: Obstetrics and Gynecology

## 2023-06-22 DIAGNOSIS — N939 Abnormal uterine and vaginal bleeding, unspecified: Secondary | ICD-10-CM | POA: Diagnosis present

## 2023-06-25 NOTE — Progress Notes (Signed)
 Internal Medicine Clinic Attending  Case discussed with the resident at the time of the visit.  We reviewed the resident's history and pertinent patient test results.  I agree with the assessment, diagnosis, and plan of care documented in the resident's note.  Debe Coder, MD

## 2023-06-26 ENCOUNTER — Ambulatory Visit (INDEPENDENT_AMBULATORY_CARE_PROVIDER_SITE_OTHER): Payer: 59

## 2023-06-26 ENCOUNTER — Ambulatory Visit (INDEPENDENT_AMBULATORY_CARE_PROVIDER_SITE_OTHER): Payer: 59 | Admitting: Student

## 2023-06-26 ENCOUNTER — Other Ambulatory Visit (HOSPITAL_COMMUNITY): Payer: Self-pay

## 2023-06-26 ENCOUNTER — Encounter: Payer: Self-pay | Admitting: Student

## 2023-06-26 VITALS — BP 153/78 | HR 90 | Ht 65.0 in | Wt 208.5 lb

## 2023-06-26 VITALS — BP 153/78 | HR 90 | Ht 65.0 in | Wt 208.8 lb

## 2023-06-26 DIAGNOSIS — Z0000 Encounter for general adult medical examination without abnormal findings: Secondary | ICD-10-CM

## 2023-06-26 DIAGNOSIS — F321 Major depressive disorder, single episode, moderate: Secondary | ICD-10-CM

## 2023-06-26 DIAGNOSIS — Z794 Long term (current) use of insulin: Secondary | ICD-10-CM | POA: Diagnosis not present

## 2023-06-26 DIAGNOSIS — F329 Major depressive disorder, single episode, unspecified: Secondary | ICD-10-CM | POA: Diagnosis not present

## 2023-06-26 DIAGNOSIS — Z01818 Encounter for other preprocedural examination: Secondary | ICD-10-CM | POA: Diagnosis not present

## 2023-06-26 DIAGNOSIS — E113413 Type 2 diabetes mellitus with severe nonproliferative diabetic retinopathy with macular edema, bilateral: Secondary | ICD-10-CM | POA: Diagnosis not present

## 2023-06-26 MED ORDER — DULOXETINE HCL 30 MG PO CPEP
30.0000 mg | ORAL_CAPSULE | Freq: Every day | ORAL | 1 refills | Status: DC
  Filled 2023-06-26: qty 30, 30d supply, fill #0
  Filled 2023-11-09: qty 30, 30d supply, fill #1

## 2023-06-26 MED ORDER — DEXCOM G7 SENSOR MISC
3 refills | Status: DC
Start: 2023-06-26 — End: 2024-07-24
  Filled 2023-06-26: qty 9, 90d supply, fill #0

## 2023-06-26 NOTE — Assessment & Plan Note (Signed)
Patient reports a history of a depressed mood attributed to her mother's death a few years ago.  She has tried Zoloft in the past at a dose listed at 25 mg.  She reported the Zoloft "did not help her".  She has a therapist that she will speak to, prior visit was a few months ago per patient.  Today the patient scored a 17 on the PHQ9.  She denied SI, HI. PLAN: -Begin duloxetine 30 mg, duloxetine is not dialyzed -The duloxetine may provide pain relief as well as mood improvement -Referral sent to integrative behavioral health -Patient will return in 4 weeks to reevaluate her mood

## 2023-06-26 NOTE — Assessment & Plan Note (Signed)
Patient has a standing history of diabetes mellitus that is insulin-dependent.  Will recent A1c 7.6% he reports more sensors for her CGM. Plan -Refilled sensors for CGM -Repeat A1c in 3 months

## 2023-06-26 NOTE — Assessment & Plan Note (Signed)
Presented today for a preoperative evaluation for a  total left knee arthroplasty.  She was recently evaluated by cardiology who provided cardiac clearance for the surgery without further cardiac testing.  Cardiology recommends holding aspirin 5 days prior to the procedure.  Based on the RCRI, she is a class IV risk which places her at a 15% for a 30-day risk of death, MI, or cardiac arrest following her surgery.  Patient is aware of her risk and would like to proceed with the surgery.  PLAN: -Patient is able to undergo surgery from our standpoint -Her A1c is currently at 7.6% -Need to follow-up on a BMP from the nephrologist closer to the surgery date -Patient was instructed to decrease insulin to 20 units the night prior to surgery -Patient was also instructed to hold the Humalog in the morning of surgery -Patient will need a another preoperative evaluation for a hysterectomy in the near future

## 2023-06-26 NOTE — Progress Notes (Signed)
Subjective:   Jane [name] is a 47 y.o. female who presents for an Initial Medicare Annual Wellness Visit.  Visit Complete: In person  Patient Medicare AWV questionnaire was completed by the patient on 06/26/2023; I have confirmed that all information answered by patient is correct and no changes since this date.  Review of Systems    Defer to PCP       Objective:    Today's Vitals   06/26/23 1453 06/26/23 1454  BP: (!) 141/84 (!) 153/78  Pulse: 87 90  SpO2: 100%   Weight: 208 lb 12.8 oz (94.7 kg)   Height: 5\' 5"  (1.651 m)   PainSc:  6    Body mass index is 34.75 kg/m.     06/26/2023    2:55 PM 06/26/2023    2:02 PM 04/15/2023   10:08 PM 03/24/2023    2:47 PM 03/13/2023    4:44 PM 08/23/2022   10:24 AM 08/09/2022   11:07 AM  Advanced Directives  Does Patient Have a Medical Advance Directive? No No No No No No No  Would patient like information on creating a medical advance directive? No - Patient declined No - Patient declined No - Patient declined  No - Patient declined  No - Patient declined    Current Medications (verified) Outpatient Encounter Medications as of 06/26/2023  Medication Sig   Accu-Chek Softclix Lancets lancets Use to check blood sugar up to 3 (three) times daily.   acetaminophen (TYLENOL) 500 MG tablet Take 1,000 mg by mouth every 6 (six) hours as needed (for pain.).   albuterol (PROVENTIL HFA) 108 (90 Base) MCG/ACT inhaler Inhale 2 puffs into the lungs every 4 (four) hours as needed for coughing, wheezing, or shortness of breath.   amLODipine-olmesartan (AZOR) 5-20 MG tablet Take 1 tablet by mouth every night   aspirin EC 81 MG tablet Take 1 tablet (81 mg total) by mouth daily. Swallow whole.   blood glucose meter kit and supplies Dispense based on patient and insurance preference. Use up to four times daily as directed. (FOR ICD-10 E10.9, E11.9).   blood glucose meter kit and supplies Use in the morning, at noon, and at bedtime.   Blood Glucose  Monitoring Suppl (ACCU-CHEK GUIDE) w/Device KIT 1 each by Does not apply route 3 (three) times daily.   cetirizine (ZYRTEC) 10 MG tablet Take 10 mg by mouth daily as needed for allergies.   Cholecalciferol (VITAMIN D3) 50 MCG (2000 UT) CAPS Take 4 capsules (8,000 Units total) by mouth daily.   Continuous Glucose Receiver (DEXCOM G7 RECEIVER) DEVI Use  to continuously monitor your blood sugars.   Continuous Glucose Sensor (DEXCOM G7 SENSOR) MISC Use it for 10 days to continuously monitor your blood sugars.   dicyclomine (BENTYL) 10 MG capsule Take 1 capsule (10 mg total) by mouth 3 (three) times daily before meals.   DULoxetine (CYMBALTA) 30 MG capsule Take 1 capsule (30 mg total) by mouth daily.   estradiol (ESTRACE VAGINAL) 0.1 MG/GM vaginal cream Place 1 Applicatorful vaginally nightly for 2 weeks then 2 nights a week afterwards.   gabapentin (NEURONTIN) 100 MG capsule Take 1 capsule (100 mg total) by mouth at bedtime.   glucose blood (ACCU-CHEK GUIDE) test strip Use to check blood sugar up to 3 (three) times daily.   Glucose Blood (BLOOD GLUCOSE TEST STRIPS) STRP Check blood sugars fasting and then with each meal up to 4 times a day.   guanFACINE (INTUNIV) 1 MG TB24 ER  tablet Take 1 tablet (1 mg total) by mouth daily.   icosapent Ethyl (VASCEPA) 1 g capsule Take 2 capsules (2 g total) by mouth 2 (two) times daily.   injection device for insulin (INPEN 100-PINK-LILLY-HUMALOG) DEVI Use to inject Humalog insulin for meals and correction insulin   insulin degludec (TRESIBA) 100 UNIT/ML FlexTouch Pen Inject 24 Units into the skin at bedtime.   insulin lispro (HUMALOG KWIKPEN) 100 UNIT/ML KwikPen Use 6-20 units as directed before meals three times a day   insulin lispro (HUMALOG) 100 UNIT/ML cartridge Inject 6-20 Units total into the skin 3 (three) times daily with meals. Use with inpen to inject at meal time and for correction insulin up to 40 units daily   Insulin Pen Needle 32G X 4 MM MISC Use to  inject insulin 4 (four) times daily.   Insulin Syringe-Needle U-100 31G X 15/64" 0.3 ML MISC Use to inject insulin daily   Lancet Device MISC Use 1 each in the morning, at noon, and at bedtime.   Lancets (ONETOUCH DELICA PLUS LANCET33G) MISC 1 each in the morning, at noon, and at bedtime.   lanthanum (FOSRENOL) 1000 MG chewable tablet Chew 1 tablet (1,000 mg total) by mouth 3 (three) times daily with meals.   leflunomide (ARAVA) 10 MG tablet Take 1 tablet (10 mg total) by mouth daily.   megestrol (MEGACE) 40 MG tablet Take 1 tablet (40 mg total) by mouth 2 (two) times daily.   metoprolol tartrate (LOPRESSOR) 25 MG tablet Take 1 tablet (25 mg total) by mouth 2 (two) times daily. Do not take the night before dialysis, and take 2 hours after dialysis.   multivitamin (RENA-VIT) TABS tablet Take 1 tablet by mouth daily.   oxyCODONE-acetaminophen (PERCOCET) 10-325 MG tablet Take 1 tablet by mouth 5 (five) times daily as needed   polyethylene glycol (MIRALAX / GLYCOLAX) 17 g packet Take 17 g by mouth every other day.   promethazine (PHENERGAN) 12.5 MG tablet Take 1 tablet (12.5 mg total) by mouth every 6 (six) hours as needed for nausea or vomiting.   rosuvastatin (CRESTOR) 40 MG tablet Take 1 tablet (40 mg total) by mouth daily.   sevelamer carbonate (RENVELA) 800 MG tablet Take 1,600-3,200 mg by mouth See admin instructions. Take 3200 mg with each meal and 1600 mg with each snack   Sodium Fluoride 1.1 % PSTE Brush teeth with small amount 2 times a day   [DISCONTINUED] Continuous Blood Gluc Transmit (DEXCOM G6 TRANSMITTER) MISC Use to check blood sugar at least 6 times a day   [DISCONTINUED] metoCLOPramide (REGLAN) 5 MG tablet Take 1 tablet (5 mg total) by mouth every 8 (eight) hours as needed for nausea.   [DISCONTINUED] ondansetron (ZOFRAN) 4 MG tablet Take 1 tablet (4 mg total) by mouth every 8 (eight) hours as needed for nausea   [DISCONTINUED] senna-docusate (SENOKOT-S) 8.6-50 MG tablet Take 1  tablet by mouth daily.   [DISCONTINUED] sertraline (ZOLOFT) 25 MG tablet Take 1 tablet (25 mg total) by mouth daily.   Facility-Administered Encounter Medications as of 06/26/2023  Medication   polyethylene glycol powder (GLYCOLAX/MIRALAX) container 255 g    Allergies (verified) Adhesive [tape]   History: Past Medical History:  Diagnosis Date   Abnormal uterine bleeding (AUB)    Arthritis of knee    Left, Gel and cortisone injections @ [facility]   Asthma    prn inhaler   CAD S/P BMS PCI to prox LAD cardiologist--- dr Judithann Sauger LAD  to Mid LAD lesion, 90% stenosed. Post intervention - Vision BMS 3.0 mm x 18 mm (~3.5 mm) there is a 0% residual stenosis. 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   left 2018; right 2019   Depression    Diabetic foot ulcer (HCC) 04/22/2019   Diabetic neuropathy (HCC)    feet   Diabetic retinopathy, nonproliferative, severe (HCC)    bilateral   Dyspnea    ESRD (end stage renal disease) on dialysis [name])    ?chronic interstitial nephritis  ([facility]   H/O non-ST elevation myocardial infarction (NSTEMI) 03/2016   Found in 90% mid LAD lesion treated with bare-metal stent (BMS) PCI - vision BMS 3.0 mm x 18 mm   Heart murmur    History of MRSA infection 2007   Hyperlipidemia    Hypertension    IDA (iron deficiency anemia)    Insulin dependent type 2 diabetes mellitus (HCC)    Iron deficiency anemia    takes iron supplement   Myocardial infarction [facility])    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   PAOD (peripheral arterial occlusive disease) (HCC)    vascular--- dr Imogene Burn   Pneumonia    PONV (postoperative nausea and vomiting)    S/P arteriovenous (AV) fistula creation 07/2017   S/p bare metal coronary artery stent 03/31/2016   BMS x1  to pLAD   Sickle cell trait (HCC)    Ulcer of left foot due to type  2 diabetes mellitus (HCC) 06/03/2019   Past Surgical History:  Procedure Laterality Date   A/V FISTULAGRAM N/A 04/13/2021   Procedure: A/V FISTULAGRAM - Right Arm;  Surgeon: Nada Libman, MD;  Location: [facility] INVASIVE CV LAB;  Service: Cardiovascular;  Laterality: N/A;   ACHILLES TENDON LENGTHENING Left 11/24/2016   Procedure: Left Achilles tendon lengthening (open);  Surgeon: Toni Arthurs, MD;  Location: [facility];  Service: Orthopedics;  Laterality: Left;   AV FISTULA PLACEMENT Right 07/31/2017   Procedure: ARTERIOVENOUS BRACHIOCEPHALIC FISTULA CREATION RIGHT ARM;  Surgeon: Fransisco Hertz, MD;  Location: [facility] OR;  Service: Vascular;  Laterality: Right;   CALCANEAL OSTEOTOMY Left 11/24/2016   Procedure: Left hindfoot osteotomy and fusion;  Surgeon: Toni Arthurs, MD;  Location: [facility];  Service: Orthopedics;  Laterality: Left;   CARDIAC CATHETERIZATION N/A 03/31/2016   Procedure: Left Heart Cath and Coronary Angiography;  Surgeon: Runell Gess, MD;  Location: [facility] INVASIVE CV LAB: 90% early mLAD, normal LV Fxn   CARDIAC CATHETERIZATION N/A 03/31/2016   Procedure: Coronary Stent Intervention;  Surgeon: Runell Gess, MD;  Location: [facility] INVASIVE CV LAB;  Service: Cardiovascular: PCI to mLAD BMS Vision 3.0 mm x 18 mm   CESAREAN SECTION  1997   CYSTOSCOPY WITH FULGERATION N/A 08/30/2022   Procedure: CYSTOSCOPY WITH FULGERATION, BIOPSY, LEFT RETROGRADE PYELOGRAM;  Surgeon: Noel Christmas, MD;  Location: [facility] ORS;  Service: Urology;  Laterality: N/A;   DILITATION & CURRETTAGE/HYSTROSCOPY WITH NOVASURE ABLATION N/A 09/15/2020   Procedure: DILATATION & CURETTAGE/HYSTEROSCOPY;  Surgeon: Willodean Rosenthal, MD;  Location: [facility];  Service: Gynecology;  Laterality: N/A;   DILITATION & CURRETTAGE/HYSTROSCOPY WITH NOVASURE ABLATION N/A 12/21/2021   Procedure: DILATATION & CURETTAGE/HYSTEROSCOPY WITH NOVASURE ABLATION/ IUD REMOVAL;  Surgeon:  Willodean Rosenthal, MD;  Location: [facility];  Service: Gynecology;  Laterality: N/A;   FISTULA SUPERFICIALIZATION  Right 11/15/2017   Procedure: FISTULA SUPERFICIALIZATION RIGHT BRACHIOCEPHALIC  RIGHT;  Surgeon: Fransisco Hertz, MD;  Location: [facility] OR;  Service: Vascular;  Laterality: Right;   FOOT SURGERY     multiple for charcot   PERIPHERAL VASCULAR BALLOON ANGIOPLASTY Right 04/13/2021   Procedure: PERIPHERAL VASCULAR BALLOON ANGIOPLASTY;  Surgeon: Nada Libman, MD;  Location: [facility];  Service: Cardiovascular;  Laterality: Right;  arm fistula   TRANSTHORACIC ECHOCARDIOGRAM  03/30/2021   EF 55 to 60%.  No R WMA.  Mild septal LVH with moderate posterior hypertrophy.  Normal RV size and function.  Normal RVP/PASP.Marland Kitchen  Trivial MR.   TUBAL LIGATION  1997   Family History  Problem Relation Age of Onset   Stroke Mother    Diabetes Mother    Hypertension Mother    Aneurysm Mother    Diabetes Father    Hypertension Father    Diabetes Sister    Hypertension Sister    Diabetes Maternal Grandmother    Diabetes Maternal Grandfather    Cancer Paternal Grandfather        Prostate   Social History   Socioeconomic History   Marital status: Single    Spouse name: Not on file   Number of children: 2   Years of education: 13   Highest education level: Not on file  Occupational History   Occupation:   Network engineer: [employer]  Tobacco Use   Smoking status: Former    Current packs/day: 0.00    Types: Cigarettes    Quit date: 02/28/2015    Years since quitting: 8.3   Smokeless tobacco: Never  Vaping Use   Vaping status: Never Used  Substance and Sexual Activity   Alcohol use: Not Currently   Drug use: Never    Comment: last use 2021-04-29   Sexual activity: Yes    Birth control/protection: Surgical  Other Topics Concern   Not on file  Social History Narrative   Patient does not drink caffeine.   Patient is right handed.       --> 2020 was a  difficult year: Her sister died in 2023-04-30 (potentially because of COVID-19), her recent ex-boyfriend died in either Mar 31, 2023 or 04/30/2023, this was followed by one of her close friends being murdered by the boyfriend.    --> Good news for 2020 was that she had a new grandkids.   Social Determinants of Health   Financial Resource Strain: High Risk (06/26/2023)   Overall Financial Resource Strain (CARDIA)    Difficulty of Paying Living Expenses: Very hard  Food Insecurity: Food Insecurity Present (06/26/2023)   Hunger Vital Sign    Worried About Running Out of Food in the Last Year: Often true    Ran Out of Food in the Last Year: Often true  Transportation Needs: Unmet Transportation Needs (06/26/2023)   PRAPARE - Administrator, Civil Service (Medical): Yes    Lack of Transportation (Non-Medical): Yes  Physical Activity: Sufficiently Active (06/26/2023)   Exercise Vital Sign    Days of Exercise per Week: 4 days    Minutes of Exercise per Session: 40 min  Stress: Stress Concern Present (06/26/2023)   [employer] of Occupational Health - Occupational Stress Questionnaire    Feeling of Stress : Very much  Social Connections: Moderately Isolated (06/26/2023)   Social Connection and Isolation Panel [NHANES]    Frequency of Communication with Friends and Family: More than three times a week  Frequency of Social Gatherings with Friends and Family: More than three times a week    Attends Religious Services: 1 to 4 times per year    Active Member of [employer] or Organizations: No    Attends Engineer, structural: Never    Marital Status: Divorced    Tobacco Counseling Counseling given: Not Answered   Clinical Intake:  Pre-visit preparation completed: Yes  Pain : 0-10 Pain Score: 6  Pain Type: Acute pain Pain Location: Knee Pain Orientation: Right Pain Descriptors / Indicators: Aching Pain Onset: More than a month ago Pain Frequency: Constant Pain Relieving Factors:  gabapentin,percocet Effect of Pain on Daily Activities: pain during movement  Pain Relieving Factors: gabapentin,percocet  Nutritional Risks: None Diabetes: Yes CBG done?: No Did pt. bring in CBG monitor from home?: No  How often do you need to have someone help you when you read instructions, pamphlets, or other written materials from your doctor or pharmacy?: 1 - Never  Interpreter Needed?: No  Information entered by ::  ,cma   Activities of Daily Living    06/26/2023    2:55 PM 06/26/2023    2:01 PM  In your present state of health, do you have any difficulty performing the following activities:  Hearing? 0 0  Vision? 0 0  Difficulty concentrating or making decisions? 1 1  Walking or climbing stairs? 1 1  Dressing or bathing? 1 1  Doing errands, shopping? 0 0    Patient Care Team: Katheran James, DO as PCP - General Marykay Lex, MD as PCP - Cardiology (Cardiology) Toni Arthurs, MD as Consulting Physician (Orthopedic Surgery) Bufford Buttner, MD as Consulting Physician (Nephrology) Marykay Lex, MD as Consulting Physician (Cardiology) Deterding, Fayrene Fearing, MD as Consulting Physician (Nephrology) [facility]  Indicate any recent Medical Services you may have received from other than [facility] providers in the past year (date may be approximate).     Assessment:   This is a routine wellness examination for Jane [name].  Hearing/Vision screen No results found.  Dietary issues and exercise activities discussed:     Goals Addressed   None   Depression Screen    06/26/2023    2:55 PM 06/26/2023    2:45 PM 03/13/2023    5:05 PM 08/09/2022   11:42 AM 05/26/2022   10:10 AM 05/02/2022    5:34 PM 11/18/2021    3:57 PM  PHQ 2/9 Scores  PHQ - 2 Score 5 5 1 1 3 2 5   PHQ- 9 Score 17 17 9 10 12 9 18     Fall Risk    06/26/2023    2:55 PM 06/26/2023    2:01 PM 04/13/2023    3:52 PM 03/13/2023    3:31 PM 08/09/2022   11:03 AM  Fall Risk   Falls  in the past year? 1 1 1 1  0  Number falls in past yr: 0 0 1 1 0  Injury with Fall? 0 0 0 0 0  Risk for fall due to : No Fall Risks No Fall Risks Impaired balance/gait;Impaired mobility No Fall Risks;Impaired balance/gait;Impaired mobility No Fall Risks  Follow up Falls evaluation completed;Falls prevention discussed Falls evaluation completed;Falls prevention discussed Falls evaluation completed Falls evaluation completed Falls prevention discussed    MEDICARE RISK AT HOME:  Medicare Risk at Home - 06/26/23 1507     Any stairs in or around the home? Yes    If so, are there any without handrails? No    Home  free of loose throw rugs in walkways, pet beds, electrical cords, etc? No    Adequate lighting in your home to reduce risk of falls? Yes    Life alert? No    Use of a cane, walker or w/c? Yes    Grab bars in the bathroom? Yes    Shower chair or bench in shower? No    Elevated toilet seat or a handicapped toilet? No             TIMED UP AND GO:  Was the test performed? No    Cognitive Function:        06/26/2023    2:56 PM  6CIT Screen  What Year? 0 points  What month? 0 points  What time? 0 points  Count back from 20 0 points  Months in reverse 0 points  Repeat phrase 0 points  Total Score 0 points    Immunizations Immunization History  Administered Date(s) Administered   19-influenza Whole 07/30/2019   Hepatitis B, ADULT 01/25/2018, 02/24/2018, 03/22/2018, 03/24/2018, 07/26/2018   Influenza Whole 01/11/2009, 07/22/2011   Influenza,inj,Quad PF,6+ Mos 08/05/2014, 12/24/2015, 08/04/2016, 08/29/2017, 08/01/2018, 07/30/2019, 09/24/2020   Moderna Sars-Covid-2 Vaccination 02/27/2020, 03/26/2020   PFIZER(Purple Top)SARS-COV-2 Vaccination 08/13/2020   Pneumococcal Conjugate-13 01/30/2018, 08/01/2018   Pneumococcal Polysaccharide-23 01/11/2009, 08/15/2014, 03/31/2018   Tdap 10/28/2013    TDAP status: Up to date  Flu Vaccine status: Due, Education has been  provided regarding the importance of this vaccine. Advised may receive this vaccine at local pharmacy or Health Dept. Aware to provide a copy of the vaccination record if obtained from local pharmacy or Health Dept. Verbalized acceptance and understanding.  Pneumococcal vaccine status: Due, Education has been provided regarding the importance of this vaccine. Advised may receive this vaccine at local pharmacy or Health Dept. Aware to provide a copy of the vaccination record if obtained from local pharmacy or Health Dept. Verbalized acceptance and understanding.  Covid-19 vaccine status: Completed vaccines  Qualifies for Shingles Vaccine? No   Zostavax completed No   Shingrix Completed?: No.    Education has been provided regarding the importance of this vaccine. Patient has been advised to call insurance company to determine out of pocket expense if they have not yet received this vaccine. Advised may also receive vaccine at local pharmacy or Health Dept. Verbalized acceptance and understanding.  Screening Tests Health Maintenance  Topic Date Due   OPHTHALMOLOGY EXAM  04/06/2021   COVID-19 Vaccine (4 - 2023-24 season) 07/15/2022   FOOT EXAM  02/16/2023   INFLUENZA VACCINE  06/15/2023   Colonoscopy  01/09/2025 (Originally 01/10/2020)   HEMOGLOBIN A1C  09/16/2023   DTaP/Tdap/Td (2 - Td or Tdap) 10/29/2023   Medicare Annual Wellness (AWV)  06/25/2024   PAP SMEAR-Modifier  03/07/2028   Hepatitis C Screening  Completed   HIV Screening  Completed   HPV VACCINES  Aged Out    Health Maintenance  Health Maintenance Due  Topic Date Due   OPHTHALMOLOGY EXAM  04/06/2021   COVID-19 Vaccine (4 - 2023-24 season) 07/15/2022   FOOT EXAM  02/16/2023   INFLUENZA VACCINE  06/15/2023    Colorectal cancer screening: Referral to GI placed 03/14/2023. Pt aware the office will call re: appt.  Lung Cancer Screening: (Low Dose CT Chest recommended if Age 72-80 years, 20 pack-year currently smoking OR have  quit w/in 15years.) does not qualify.   Lung Cancer Screening Referral: N/A  Additional Screening:  Hepatitis C Screening: does not qualify; Completed 08/29/2017  Vision Screening: Recommended annual ophthalmology exams for early detection of glaucoma and other disorders of the eye. Is the patient up to date with their annual eye exam?  No  Who is the provider or what is the name of the office in which the patient attends annual eye exams? N/A If pt is not established with a provider, would they like to be referred to a provider to establish care? No .   Dental Screening: Recommended annual dental exams for proper oral hygiene   Community Resource Referral / Chronic Care Management: CRR required this visit?  No   CCM required this visit?  No     Plan:     I have personally reviewed and noted the following in the patient's chart:   Medical and social history Use of alcohol, tobacco or illicit drugs  Current medications and supplements including opioid prescriptions. Patient is currently taking opioid prescriptions. Information provided to patient regarding non-opioid alternatives. Patient advised to discuss non-opioid treatment plan with their provider. Functional ability and status Nutritional status Physical activity Advanced directives List of other physicians Hospitalizations, surgeries, and ER visits in previous 12 months Vitals Screenings to include cognitive, depression, and falls Referrals and appointments  In addition, I have reviewed and discussed with patient certain preventive protocols, quality metrics, and best practice recommendations. A written personalized care plan for preventive services as well as general preventive health recommendations were provided to patient.     Cala Bradford, CMA   06/26/2023   After Visit Summary: (MyChart) Due to this being a telephonic visit, the after visit summary with patients personalized plan was offered to patient via  MyChart   Nurse Notes: Face-To-Face Visit  Jane [name] , Thank you for taking time to come for your Medicare Wellness Visit. I appreciate your ongoing commitment to your health goals. Please review the following plan we discussed and let me know if I can assist you in the future.   These are the goals we discussed:  Goals      Blood Pressure < 140/90     BP Readings from Last 3 Encounters:  07/27/21 110/62  06/17/21 (!) 171/89  06/03/21 115/66          HEMOGLOBIN A1C < 7     Lab Results  Component Value Date   HGBA1C 6.8 (A) 04/01/2021   HGBA1C 9.0 (A) 01/07/2021   HGBA1C 9.5 (A) 01/29/2020   Lab Results  Component Value Date   MICROALBUR 40.6 (H) 08/05/2014   LDLCALC 66 01/27/2021   CREATININE 10.60 (H) 05/26/2021   Not meeting Hgb A1C goals- discussed strategies to improve diabetes management      LDL CALC < 100     Lab Results  Component Value Date   CHOL 130 01/27/2021   HDL 44 01/27/2021   LDLCALC 66 01/27/2021   TRIG 107 01/27/2021   CHOLHDL 3.0 01/27/2021   Encouraged patient to have lipid panel drawn ,that were ordered 06/22/20,when she has her preoperative labs      Make and Keep All Appointments     Timeframe:  Long-Range Goal Priority:  High Start Date:         10/30/20                    Expected End Date:    ongoing                 Follow Up Date 10/13/21   - ask family or friend for  a ride - call to cancel if needed - keep a calendar with appointment dates    Why is this important?   Part of staying healthy is seeing the doctor for follow-up care.  If you forget your appointments, there are some things you can do to stay on track.    Notes: Patient missed ESRD on 08/25/21 due to illness        This is a list of the screening recommended for you and due dates:  Health Maintenance  Topic Date Due   Eye exam for diabetics  04/06/2021   COVID-19 Vaccine (4 - 2023-24 season) 07/15/2022   Complete foot exam   02/16/2023   Flu Shot   06/15/2023   Colon Cancer Screening  01/09/2025*   Hemoglobin A1C  09/16/2023   DTaP/Tdap/Td vaccine (2 - Td or Tdap) 10/29/2023   Medicare Annual Wellness Visit  06/25/2024   Pap Smear  03/07/2028   Hepatitis C Screening  Completed   HIV Screening  Completed   HPV Vaccine  Aged Out  *Topic was postponed. The date shown is not the original due date.

## 2023-06-26 NOTE — Patient Instructions (Addendum)
For your surgery: -The night before: Decrease insulin degludec (TRESIBA) to 20 units -The morning of surgery: do NOT take the Humalog (meal time insulin) the morning of surgery    We have started you on duloxetine (Cymbalta) 30 mg for the depressed mood -Please return in four weeks to reevaluate the mood

## 2023-06-26 NOTE — Progress Notes (Signed)
Established Patient Office Visit  Subjective   Patient ID: Jane [name], female    DOB: Apr 11, 1976  Age: 47 y.o. MRN: [id number]  Chief Complaint  Patient presents with   Procedure    Surgical Clearance   Referral    Letter to [facility] knee surgeon Dr.Swinteck   Foot Pain    Requesting increase for Gabapentin   Diabetes    Dexcom sensor not lasting as long as it should    Jane [name] is a 47 year old female with a past medical history of NSTEMI, CAD s/p stent, ESRD on HD, insulin dependent T2DM, and chronic diastolic heart failure who is here for a preoperative evaluation for total left knee arthroplasty. Please see problem based assessment and plan for additional details.        Past Medical History:  Diagnosis Date   Abnormal uterine bleeding (AUB)    Arthritis of knee    Left, Gel and cortisone injections @ [facility]   Asthma    prn inhaler   CAD S/P BMS PCI to prox LAD cardiologist--- dr Judithann Sauger LAD to Mid LAD lesion, 90% stenosed. Post intervention - Vision BMS 3.0 mm x 18 mm (~3.5 mm) there is a 0% residual stenosis. 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   left 2018; right 2019   Depression    Diabetic foot ulcer (HCC) 04/22/2019   Diabetic neuropathy (HCC)    feet   Diabetic retinopathy, nonproliferative, severe (HCC)    bilateral   Dyspnea    ESRD (end stage renal disease) on dialysis 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   PAOD (peripheral arterial occlusive disease) (HCC)    vascular--- dr Imogene Burn   Pneumonia    PONV (postoperative nausea and vomiting)    S/P arteriovenous (AV) fistula creation 07/2017   S/p bare metal coronary artery stent 03/31/2016   BMS x1  to pLAD   Sickle cell trait (HCC)    Ulcer of left foot due to type 2 diabetes mellitus (HCC) 06/03/2019      Objective:     BP (!) 153/78 (BP Location: Left Arm, Patient Position: Sitting, Cuff Size: Normal)   Pulse 90   Ht 5\' 5"  (1.651 m)   Wt 208 lb 8 oz (94.6 kg)   LMP 05/19/2023   SpO2 100%   BMI 34.70 kg/m  BP Readings from Last 3 Encounters:  06/26/23 (!) 153/78  06/26/23 (!) 153/78  06/16/23 (!) 150/66      Physical Exam Constitutional:      Appearance: She is obese.  Eyes:     Conjunctiva/sclera:     Right eye: Right conjunctiva is injected.     Left eye: Left conjunctiva is injected.  Cardiovascular:     Rate  and Rhythm: Normal rate and regular rhythm.     Heart sounds: Normal heart sounds.     Arteriovenous access: Right arteriovenous access is present.    Comments: Palpable thrill and bruit auscultated on right AVF on right upper extremity Pulmonary:     Effort: Pulmonary effort is normal.     Breath sounds: Normal breath sounds.  Musculoskeletal:     Right lower leg: No edema.     Left lower leg: No edema.  Skin:    General: Skin is warm and dry.  Neurological:     Mental Status: She is alert and oriented to person, place, and time.  Psychiatric:        Behavior: Behavior is cooperative.        Thought Content: Thought content does not include homicidal or suicidal ideation.      No results found for any visits on 06/26/23.  Last CBC Lab Results  Component Value Date   WBC 6.7 04/15/2023   HGB 10.8 (L) 04/15/2023   HCT 35.7 (L) 04/15/2023   MCV 86.0 04/15/2023   MCH 26.0 04/15/2023   RDW 15.9  (H) 04/15/2023   PLT 176 04/15/2023   Last metabolic panel Lab Results  Component Value Date   GLUCOSE 338 (H) 04/15/2023   NA 133 (L) 04/15/2023   K 4.4 04/15/2023   CL 91 (L) 04/15/2023   CO2 27 04/15/2023   BUN 39 (H) 04/15/2023   CREATININE 10.10 (H) 04/15/2023   GFRNONAA 4 (L) 04/15/2023   CALCIUM 8.0 (L) 04/15/2023   PHOS 6.9 (H) 11/10/2021   PROT 6.8 02/28/2022   ALBUMIN 3.2 (L) 02/28/2022   LABGLOB 3.9 12/08/2015   AGRATIO 0.8 (L) 12/08/2015   BILITOT 0.4 02/28/2022   ALKPHOS 91 02/28/2022   AST 12 (L) 02/28/2022   ALT 10 02/28/2022   ANIONGAP 15 04/15/2023   Last lipids Lab Results  Component Value Date   CHOL 211 (H) 06/14/2023   HDL 71 06/14/2023   LDLCALC 124 (H) 06/14/2023   TRIG 90 06/14/2023   CHOLHDL 3.0 06/14/2023   Last hemoglobin A1c Lab Results  Component Value Date   HGBA1C 7.6 (H) 06/16/2023      The 10-year ASCVD risk score (Arnett DK, et al., 2019) is: 10.4%    Assessment & Plan:   Problem List Items Addressed This Visit       Endocrine   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  Will recent A1c 7.6% he reports more sensors for her CGM. Plan -Refilled sensors for CGM -Repeat A1c in 3 months      Relevant Medications   Continuous Glucose Sensor (DEXCOM G7 SENSOR) MISC     Other   Major depressive disorder - Primary (Chronic)    Patient reports a history of a depressed mood attributed to her mother's death a few years ago.  She has tried Zoloft in the past at a dose listed at 25 mg.  She reported the Zoloft "did not help her".  She has a therapist that she will speak to, prior visit was a few months ago per patient.  Today the patient scored a 17 on the PHQ9.  She denied SI, HI. PLAN: -Begin duloxetine 30 mg, duloxetine is not dialyzed -The duloxetine may provide  pain relief as well as mood improvement -Referral sent to integrative behavioral  health -Patient will return in 4 weeks to reevaluate her mood      Relevant Medications   DULoxetine (CYMBALTA) 30 MG capsule   Other Relevant Orders   Ambulatory referral to Integrated Behavioral Health   Encounter for perioperative consultation    Presented today for a preoperative evaluation for a  total left knee arthroplasty.  She was recently evaluated by cardiology who provided cardiac clearance for the surgery without further cardiac testing.  Cardiology recommends holding aspirin 5 days prior to the procedure.  Based on the RCRI, she is a class IV risk which places her at a 15% for a 30-day risk of death, MI, or cardiac arrest following her surgery.  Patient is aware of her risk and would like to proceed with the surgery.  PLAN: -Patient is able to undergo surgery from our standpoint -Her A1c is currently at 7.6% -Need to follow-up on a BMP from the nephrologist closer to the surgery date -Patient was instructed to decrease insulin to 20 units the night prior to surgery -Patient was also instructed to hold the Humalog in the morning of surgery -Patient will need a another preoperative evaluation for a hysterectomy in the near future        Return in about 4 weeks (around 07/24/2023) for Mood evaluation.    Faith Rogue, DO

## 2023-06-27 NOTE — Progress Notes (Deleted)
Office Note   History of Present Illness   Jane [name] is a 47 y.o. ([birth year]) female who presents for follow up for dialysis access. She has a history of right brachiocephalic fistula creation by Dr.Chen in September 2018 and superficialization in January 2019. She required fistulogram with cephalic vein balloon angioplasty on 04/13/2021 by Dr.Brabham. This was done for prolonged bleeding from stick sites. 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   She returns for repeat evaluation.  Current Outpatient Medications  Medication Sig Dispense Refill   Accu-Chek Softclix Lancets lancets Use to check blood sugar up to 3 (three) times daily. 300 each 3   acetaminophen (TYLENOL) 500 MG tablet Take 1,000 mg by mouth every 6 (six) hours as needed (for pain.).     albuterol (PROVENTIL HFA) 108 (90 Base) MCG/ACT inhaler Inhale 2 puffs into the lungs every 4 (four) hours as needed for coughing, wheezing, or shortness of breath. 20.1 g 2   amLODipine-olmesartan (AZOR) 5-20 MG tablet Take 1 tablet by mouth every night 90 tablet 3   aspirin EC 81 MG tablet Take 1 tablet (81 mg total) by mouth daily. Swallow whole. 90 tablet 3   blood glucose meter kit and supplies Dispense based on patient and insurance preference. Use up to four times daily as directed. (FOR ICD-10 E10.9, E11.9). 1 each 0   blood glucose meter kit and supplies Use in the morning, at noon, and at bedtime. 1 each 0   Blood Glucose Monitoring Suppl (ACCU-CHEK GUIDE) w/Device KIT 1 each by Does not apply route 3 (three) times daily. 1 kit 0   cetirizine (ZYRTEC) 10 MG tablet Take 10 mg by mouth daily as needed for allergies.     Cholecalciferol (VITAMIN D3) 50 MCG (2000 UT) CAPS Take 4 capsules (8,000 Units total) by mouth daily. 360 capsule 3   Continuous Glucose Receiver (DEXCOM G7 RECEIVER) DEVI Use  to continuously monitor your blood sugars. 1 each 0   Continuous Glucose Sensor  (DEXCOM G7 SENSOR) MISC Use it for 10 days to continuously monitor your blood sugars. 9 each 3   dicyclomine (BENTYL) 10 MG capsule Take 1 capsule (10 mg total) by mouth 3 (three) times daily before meals. 90 capsule 0   DULoxetine (CYMBALTA) 30 MG capsule Take 1 capsule (30 mg total) by mouth daily. 30 capsule 1   estradiol (ESTRACE VAGINAL) 0.1 MG/GM vaginal cream Place 1 Applicatorful vaginally nightly for 2 weeks then 2 nights a week afterwards. 42.5 g 4   gabapentin (NEURONTIN) 100 MG capsule Take 1 capsule (100 mg total) by mouth at bedtime. 90 capsule 1   glucose blood (ACCU-CHEK GUIDE) test strip Use to check blood sugar up to 3 (three) times daily. 300 each 3   Glucose Blood (BLOOD GLUCOSE TEST STRIPS) STRP Check blood sugars fasting and then with each meal up to 4 times a day. 100 strip 0   guanFACINE (INTUNIV) 1 MG TB24 ER tablet Take 1 tablet (1 mg total) by mouth daily. 30 tablet 0   icosapent Ethyl (VASCEPA) 1 g capsule Take 2 capsules (2 g total) by mouth 2 (two) times daily. 180 capsule 1   injection device for insulin (INPEN 100-PINK-LILLY-HUMALOG) DEVI Use to inject Humalog insulin for meals and correction insulin 1 each 1   insulin degludec (TRESIBA) 100 UNIT/ML FlexTouch Pen Inject 24 Units into the skin at bedtime. 15 mL 1   insulin lispro (  HUMALOG KWIKPEN) 100 UNIT/ML KwikPen Use 6-20 units as directed before meals three times a day 15 mL 3   insulin lispro (HUMALOG) 100 UNIT/ML cartridge Inject 6-20 Units total into the skin 3 (three) times daily with meals. Use with inpen to inject at meal time and for correction insulin up to 40 units daily 15 mL 3   Insulin Pen Needle 32G X 4 MM MISC Use to inject insulin 4 (four) times daily. 400 each 3   Insulin Syringe-Needle U-100 31G X 15/64" 0.3 ML MISC Use to inject insulin daily 100 each 3   Lancet Device MISC Use 1 each in the morning, at noon, and at bedtime. 1 each 0   Lancets (ONETOUCH DELICA PLUS LANCET33G) MISC 1 each in the  morning, at noon, and at bedtime. 100 each 0   lanthanum (FOSRENOL) 1000 MG chewable tablet Chew 1 tablet (1,000 mg total) by mouth 3 (three) times daily with meals. 270 tablet 4   leflunomide (ARAVA) 10 MG tablet Take 1 tablet (10 mg total) by mouth daily. 30 tablet 2   megestrol (MEGACE) 40 MG tablet Take 1 tablet (40 mg total) by mouth 2 (two) times daily. 60 tablet 6   metoprolol tartrate (LOPRESSOR) 25 MG tablet Take 1 tablet (25 mg total) by mouth 2 (two) times daily. Do not take the night before dialysis, and take 2 hours after dialysis. 180 tablet 0   multivitamin (RENA-VIT) TABS tablet Take 1 tablet by mouth daily. 90 tablet 3   oxyCODONE-acetaminophen (PERCOCET) 10-325 MG tablet Take 1 tablet by mouth 5 (five) times daily as needed 150 tablet 0   polyethylene glycol (MIRALAX / GLYCOLAX) 17 g packet Take 17 g by mouth every other day.     promethazine (PHENERGAN) 12.5 MG tablet Take 1 tablet (12.5 mg total) by mouth every 6 (six) hours as needed for nausea or vomiting. 20 tablet 0   rosuvastatin (CRESTOR) 40 MG tablet Take 1 tablet (40 mg total) by mouth daily. 90 tablet 1   sevelamer carbonate (RENVELA) 800 MG tablet Take 1,600-3,200 mg by mouth See admin instructions. Take 3200 mg with each meal and 1600 mg with each snack     Sodium Fluoride 1.1 % PSTE Brush teeth with small amount 2 times a day 100 mL 5   Current Facility-Administered Medications  Medication Dose Route Frequency Provider Last Rate Last Admin   polyethylene glycol powder (GLYCOLAX/MIRALAX) container 255 g  1 Container Oral Once Crissie Sickles, MD        ***REVIEW OF SYSTEMS (negative unless checked):   Cardiac:  []  Chest pain or chest pressure? []  Shortness of breath upon activity? []  Shortness of breath when lying flat? []  Irregular heart rhythm?  Vascular:  []  Pain in calf, thigh, or hip brought on by walking? []  Pain in feet at night that wakes you up from your sleep? []  Blood clot in your veins? []  Leg  swelling?  Pulmonary:  []  Oxygen at home? []  Productive cough? []  Wheezing?  Neurologic:  []  Sudden weakness in arms or legs? []  Sudden numbness in arms or legs? []  Sudden onset of difficult speaking or slurred speech? []  Temporary loss of vision in one eye? []  Problems with dizziness?  Gastrointestinal:  []  Blood in stool? []  Vomited blood?  Genitourinary:  []  Burning when urinating? []  Blood in urine?  Psychiatric:  []  Major depression  Hematologic:  []  Bleeding problems? []  Problems with blood clotting?  Dermatologic:  []  Rashes or ulcers?  Constitutional:  []  Fever or chills?  Ear/Nose/Throat:  []  Change in hearing? []  Nose bleeds? []  Sore throat?  Musculoskeletal:  []  Back pain? []  Joint pain? []  Muscle pain?   Physical Examination  There were no vitals filed for this visit. There is no height or weight on file to calculate BMI.  General:  WDWN in NAD; vital signs documented above Gait: Not observed HENT: WNL, normocephalic Pulmonary: normal non-labored breathing , without rales, rhonchi,  wheezing Cardiac: regular Abdomen: soft, NT, no masses Skin: without rashes Vascular Exam/Pulses: *** radial and brachial pulses bilaterally Extremities: *** Musculoskeletal: no muscle wasting or atrophy  Neurologic: A&O X 3;  No focal weakness or paresthesias are detected Psychiatric:  The pt has Normal affect.   Non-invasive Vascular Imaging   Right Arm Access Duplex  (06/29/2023):       Medical Decision Making   ENSLIE SHANHOLTZ is a 47 y.o. female who presents for dialysis access evaluation  ***   Loel Dubonnet PA-C [facility] Office: [phone]  Clinic MD: Randie Heinz

## 2023-06-29 ENCOUNTER — Ambulatory Visit (HOSPITAL_COMMUNITY)
Admission: RE | Admit: 2023-06-29 | Discharge: 2023-06-29 | Disposition: A | Discharge status: Home / Self Care | Payer: 59 | Source: Ambulatory Visit | Attending: Physician Assistant | Admitting: Physician Assistant

## 2023-06-29 ENCOUNTER — Ambulatory Visit (INDEPENDENT_AMBULATORY_CARE_PROVIDER_SITE_OTHER): Payer: 59 | Admitting: Physician Assistant

## 2023-06-29 ENCOUNTER — Ambulatory Visit (INDEPENDENT_AMBULATORY_CARE_PROVIDER_SITE_OTHER)
Admission: RE | Admit: 2023-06-29 | Discharge: 2023-06-29 | Disposition: A | Discharge status: Home / Self Care | Payer: 59 | Source: Ambulatory Visit | Attending: Cardiology | Admitting: Cardiology

## 2023-06-29 VITALS — BP 183/98 | HR 93 | Temp 98.3°F | Resp 18 | Ht 65.0 in | Wt 214.0 lb

## 2023-06-29 DIAGNOSIS — Z992 Dependence on renal dialysis: Secondary | ICD-10-CM

## 2023-06-29 DIAGNOSIS — N186 End stage renal disease: Secondary | ICD-10-CM

## 2023-06-29 DIAGNOSIS — I779 Disorder of arteries and arterioles, unspecified: Secondary | ICD-10-CM

## 2023-06-29 DIAGNOSIS — I739 Peripheral vascular disease, unspecified: Secondary | ICD-10-CM | POA: Diagnosis not present

## 2023-06-29 LAB — VAS US ABI WITH/WO TBI
Left ABI: 1.11
Right ABI: 1.2

## 2023-06-29 NOTE — Progress Notes (Signed)
Internal Medicine Clinic Attending  I was physically present during the key portions of the resident provided service and participated in the medical decision making of patient's management care. I reviewed pertinent patient test results.  The assessment, diagnosis, and plan were formulated together and I agree with the documentation in the resident's note.  Mercie Eon, MD    I agree that patient is medically optimized for her upcoming surgery. I agree with Dr Larrie Kass plan regarding insulin before surgery.

## 2023-06-29 NOTE — Progress Notes (Signed)
[facility] [place] HISTORY AND PHYSICAL   History of Present Illness:  Patient is a 47 y.o. year old female who presents for ESRD access aneurysm.  She was last seen in our office on 5/24/22for fistula malfunction with prolonged bleeding post HD.  DR. Myra Gianotti performed Venoplasty.  .  She has history of right BC AV fistula that was placed by Dr. Imogene Burn and followed with superficialization in 2019.      She states she was having a few episodes of prolonged bleeding after HD until [facility] performed a fistulogram with venoplasty  She has no prolonged bleeding and her fistula is working fine.  She denies scabs or thinning skin over the aneurysm.    Of note she is going to have a TKA with [facility] and we did ABI's to check her blood flow.        Past Medical History:  Diagnosis Date   Abnormal uterine bleeding (AUB)    Arthritis of knee    Left, Gel and cortisone injections @ [facility]   Asthma    prn inhaler   CAD S/P BMS PCI to prox LAD cardiologist--- dr Judithann Sauger LAD to Mid LAD lesion, 90% stenosed. Post intervention - Vision BMS 3.0 mm x 18 mm (~3.5 mm) there is a 0% residual stenosis. 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   left 2018; right 2019   Depression    Diabetic foot ulcer (HCC) 04/22/2019   Diabetic neuropathy (HCC)    feet   Diabetic retinopathy, nonproliferative, severe (HCC)    bilateral   Dyspnea    ESRD (end stage renal disease) on dialysis [facility])    ?chronic interstitial nephritis  ([facility]   H/O non-ST elevation myocardial infarction (NSTEMI) 03/2016   Found in 90% mid LAD lesion treated with bare-metal stent (BMS) PCI - vision BMS 3.0 mm x 18 mm   Heart murmur    History of MRSA infection 2007   Hyperlipidemia    Hypertension    IDA (iron deficiency anemia)    Insulin dependent type 2 diabetes mellitus (HCC)    Iron  deficiency anemia    takes iron supplement   Myocardial infarction [facility])    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   PAOD (peripheral arterial occlusive disease) (HCC)    vascular--- dr Imogene Burn   Pneumonia    PONV (postoperative nausea and vomiting)    S/P arteriovenous (AV) fistula creation 07/2017   S/p bare metal coronary artery stent 03/31/2016   BMS x1  to pLAD   Sickle cell trait (HCC)    Ulcer of left foot due to type 2 diabetes mellitus (HCC) 06/03/2019    Past Surgical History:  Procedure Laterality Date   A/V FISTULAGRAM N/A 04/13/2021   Procedure: A/V FISTULAGRAM - Right Arm;  Surgeon: Nada Libman, MD;  Location: [facility] IN[facility];  Service: Cardiovascular;  Laterality: N/A;   ACHILLES TENDON LENGTHENING Left 11/24/2016   Procedure: Left Achilles tendon lengthening (open);  Surgeon: Toni Arthurs, MD;  Location: [facility];  Service: Orthopedics;  Laterality: Left;   AV FISTULA PLACEMENT Right 07/31/2017   Procedure: ARTERIOVENOUS BRACHIOCEPHALIC FISTULA CREATION RIGHT ARM;  Surgeon: Fransisco Hertz, MD;  Location: [facility] OR;  Service: Vascular;  Laterality: Right;   CALCANEAL OSTEOTOMY Left 11/24/2016  Procedure: Left hindfoot osteotomy and fusion;  Surgeon: Toni Arthurs, MD;  Location: [facility];  Service: Orthopedics;  Laterality: Left;   CARDIAC CATHETERIZATION N/A 03/31/2016   Procedure: Left Heart Cath and Coronary Angiography;  Surgeon: Runell Gess, MD;  Location: [facility] INVASIVE CV LAB: 90% early mLAD, normal LV Fxn   CARDIAC CATHETERIZATION N/A 03/31/2016   Procedure: Coronary Stent Intervention;  Surgeon: Runell Gess, MD;  Location: [facility] INVAS[facility] CV LAB;  Service: Cardiovascular: PCI to mLAD BMS Vision 3.0 mm x 18 mm   CESAREAN SECTION  1997   CYSTOSCOPY WITH FULGERATION N/A 08/30/2022   Procedure: CYSTOSCOPY WITH FULGERATION, BIOPSY, LEFT RETROGRADE PYELOGRAM;  Surgeon: Noel Christmas, MD;   Location: [facility] ORS;  Service: Urology;  Laterality: N/A;   DILITATION & CURRETTAGE/HYSTROSCOPY WITH NOVASURE ABLATION N/A 09/15/2020   Procedure: DILATATION & CURETTAGE/HYSTEROSCOPY;  Surgeon: Willodean Rosenthal, MD;  Location: [facility];  Service: Gynecology;  Laterality: N/A;   DILITATION & CURRETTAGE/HYSTROSCOPY WITH NOVASURE ABLATION N/A 12/21/2021   Procedure: DILATATION & CURETTAGE/HYSTEROSCOPY WITH NOVASURE ABLATION/ IUD REMOVAL;  Surgeon: Willodean Rosenthal, MD;  Location[facility];  Service: Gynecology;  Laterality: N/A;   FISTULA SUPERFICIALIZATION Right 11/15/2017   Procedure: FISTULA SUPERFICIALIZATION RIGHT BRACHIOCEPHALIC  RIGHT;  Surgeon: Fransisco Hertz, MD;  Location: [facility] OR;  Service: Vascular;  Laterality: Right;   FOOT SURGERY     multiple for charcot   PERIPHERAL VASCULAR BALLOON ANGIOPLASTY Right 04/13/2021   Procedure: PERIPHERAL VASCULAR BALLOON ANGIOPLASTY;  Surgeon: Nada Libman, MD;  Location: [facility];  Service: Cardiovascular;  Laterality: Right;  arm fistula   TRANSTHORACIC ECHOCARDIOGRAM  02/2021   EF 55 to 60%.  No R WMA.  Mild septal LVH with moderate posterior hypertrophy.  Normal RV size and function.  Normal RVP/PASP.Marland Kitchen  Trivial MR.   TUBAL LIGATION  1997     Social History Social History   Tobacco Use   Smoking status: Former    Current packs/day: 0.00    Types: Cigarettes    Quit date: 02/28/2015    Years since quitting: 8.3   Smokeless tobacco: Never  Vaping Use   Vaping status: Never Used  Substance Use Topics   Alcohol use: Not Currently   Drug use: Never    Comment: last use 03/2021    Family History Family History  Problem Relation Age of Onset   Stroke Mother    Diabetes Mother    Hypertension Mother    Aneurysm Mother    Diabetes Father    Hypertension Father    Diabetes Sister    Hypertension Sister    Diabetes Maternal Grandmother    Diabetes Maternal Grandfather    Cancer  Paternal Grandfather        Prostate    Allergies  Allergies  Allergen Reactions   Adhesive [Tape] Other (See Comments)    Irritation      Current Outpatient Medications  Medication Sig Dispense Refill   Accu-Chek Softclix Lancets lancets Use to check blood sugar up to 3 (three) times daily. 300 each 3   acetaminophen (TYLENOL) 500 MG tablet Take 1,000 mg by mouth every 6 (six) hours as needed (for pain.).     albuterol (PROVENTIL HFA) 108 (90 Base) MCG/ACT inhaler Inhale 2 puffs into the lungs every 4 (four) hours as needed for coughing, wheezing, or shortness of breath. 20.1 g 2   amLODipine-olmesartan (AZOR) 5-20 MG tablet Take 1 tablet by mouth every  night 90 tablet 3   aspirin EC 81 MG tablet Take 1 tablet (81 mg total) by mouth daily. Swallow whole. 90 tablet 3   blood glucose meter kit and supplies Dispense based on patient and insurance preference. Use up to four times daily as directed. (FOR ICD-10 E10.9, E11.9). 1 each 0   blood glucose meter kit and supplies Use in the morning, at noon, and at bedtime. 1 each 0   Blood Glucose Monitoring Suppl (ACCU-CHEK GUIDE) w/Device KIT 1 each by Does not apply route 3 (three) times daily. 1 kit 0   cetirizine (ZYRTEC) 10 MG tablet Take 10 mg by mouth daily as needed for allergies.     Cholecalciferol (VITAMIN D3) 50 MCG (2000 UT) CAPS Take 4 capsules (8,000 Units total) by mouth daily. 360 capsule 3   Continuous Glucose Receiver (DEXCOM G7 RECEIVER) DEVI Use  to continuously monitor your blood sugars. 1 each 0   Continuous Glucose Sensor (DEXCOM G7 SENSOR) MISC Use it for 10 days to continuously monitor your blood sugars. 9 each 3   dicyclomine (BENTYL) 10 MG capsule Take 1 capsule (10 mg total) by mouth 3 (three) times daily before meals. 90 capsule 0   DULoxetine (CYMBALTA) 30 MG capsule Take 1 capsule (30 mg total) by mouth daily. 30 capsule 1   estradiol (ESTRACE VAGINAL) 0.1 MG/GM vaginal cream Place 1 Applicatorful vaginally  nightly for 2 weeks then 2 nights a week afterwards. 42.5 g 4   gabapentin (NEURONTIN) 100 MG capsule Take 1 capsule (100 mg total) by mouth at bedtime. 90 capsule 1   glucose blood (ACCU-CHEK GUIDE) test strip Use to check blood sugar up to 3 (three) times daily. 300 each 3   Glucose Blood (BLOOD GLUCOSE TEST STRIPS) STRP Check blood sugars fasting and then with each meal up to 4 times a day. 100 strip 0   guanFACINE (INTUNIV) 1 MG TB24 ER tablet Take 1 tablet (1 mg total) by mouth daily. 30 tablet 0   icosapent Ethyl (VASCEPA) 1 g capsule Take 2 capsules (2 g total) by mouth 2 (two) times daily. 180 capsule 1   injection device for insulin (INPEN 100-PINK-LILLY-HUMALOG) DEVI Use to inject Humalog insulin for meals and correction insulin 1 each 1   insulin degludec (TRESIBA) 100 UNIT/ML FlexTouch Pen Inject 24 Units into the skin at bedtime. 15 mL 1   insulin lispro (HUMALOG KWIKPEN) 100 UNIT/ML KwikPen Use 6-20 units as directed before meals three times a day 15 mL 3   insulin lispro (HUMALOG) 100 UNIT/ML cartridge Inject 6-20 Units total into the skin 3 (three) times daily with meals. Use with inpen to inject at meal time and for correction insulin up to 40 units daily 15 mL 3   Insulin Pen Needle 32G X 4 MM MISC Use to inject insulin 4 (four) times daily. 400 each 3   Insulin Syringe-Needle U-100 31G X 15/64" 0.3 ML MISC Use to inject insulin daily 100 each 3   Lancet Device MISC Use 1 each in the morning, at noon, and at bedtime. 1 each 0   Lancets (ONETOUCH DELICA PLUS LANCET33G) MISC 1 each in the morning, at noon, and at bedtime. 100 each 0   lanthanum (FOSRENOL) 1000 MG chewable tablet Chew 1 tablet (1,000 mg total) by mouth 3 (three) times daily with meals. 270 tablet 4   leflunomide (ARAVA) 10 MG tablet Take 1 tablet (10 mg total) by mouth daily. 30 tablet 2   megestrol (MEGACE)  40 MG tablet Take 1 tablet (40 mg total) by mouth 2 (two) times daily. 60 tablet 6   metoprolol tartrate  (LOPRESSOR) 25 MG tablet Take 1 tablet (25 mg total) by mouth 2 (two) times daily. Do not take the night before dialysis, and take 2 hours after dialysis. 180 tablet 0   multivitamin (RENA-VIT) TABS tablet Take 1 tablet by mouth daily. 90 tablet 3   oxyCODONE-acetaminophen (PERCOCET) 10-325 MG tablet Take 1 tablet by mouth 5 (five) times daily as needed 150 tablet 0   polyethylene glycol (MIRALAX / GLYCOLAX) 17 g packet Take 17 g by mouth every other day.     promethazine (PHENERGAN) 12.5 MG tablet Take 1 tablet (12.5 mg total) by mouth every 6 (six) hours as needed for nausea or vomiting. 20 tablet 0   rosuvastatin (CRESTOR) 40 MG tablet Take 1 tablet (40 mg total) by mouth daily. 90 tablet 1   sevelamer carbonate (RENVELA) 800 MG tablet Take 1,600-3,200 mg by mouth See admin instructions. Take 3200 mg with each meal and 1600 mg with each snack     Sodium Fluoride 1.1 % PSTE Brush teeth with small amount 2 times a day 100 mL 5   Current Facility-Administered Medications  Medication Dose Route Frequency Provider Last Rate Last Admin   polyethylene glycol powder (GLYCOLAX/MIRALAX) container 255 g  1 Container Oral Once Crissie Sickles, MD        ROS:   General:  No weight loss, Fever, chills  HEENT: No recent headaches, no nasal bleeding, no visual changes, no sore throat  Neurologic: No dizziness, blackouts, seizures. No recent symptoms of stroke or mini- stroke. No recent episodes of slurred speech, or temporary blindness.  Cardiac: No recent episodes of chest pain/pressure, no shortness of breath at rest.  No shortness of breath with exertion.  Denies history of atrial fibrillation or irregular heartbeat  Vascular: No history of rest pain in feet.  No history of claudication.  No history of non-healing ulcer, No history of DVT   Pulmonary: No home oxygen, no productive cough, no hemoptysis,  No asthma or wheezing  Musculoskeletal:  [ ]  Arthritis, [ ]  Low back pain,  [ x] Joint  pain  Hematologic:No history of hypercoagulable state.  No history of easy bleeding.  No history of anemia  Gastrointestinal: No hematochezia or melena,  No gastroesophageal reflux, no trouble swallowing  Urinary: [ ]  chronic Kidney disease, [ x] on HD - [ ]  MWF or [ ]  TTHS, [ ]  Burning with urination, [ ]  Frequent urination, [ ]  Difficulty urinating;   Skin: No rashes  Psychological: No history of anxiety,  No history of depression   Physical Examination  Vitals:   06/29/23 0831 06/29/23 0833  BP: (!) 184/90 (!) 183/98  Pulse: 93   Resp: 18   Temp: 98.3 F (36.8 C)   TempSrc: Temporal   SpO2: 96%   Weight: 214 lb (97.1 kg)   Height: 5\' 5"  (1.651 m)     Body mass index is 35.61 kg/m.  General:  Alert and oriented, no acute distress HEENT: Normal Neck: No bruit or JVD Pulmonary: Clear to auscultation bilaterally Cardiac: Regular Rate and Rhythm without murmur Gastrointestinal: Soft, non-tender, non-distended, no mass, no scars Skin: No rash, compressible aneurysm right UE, palpable thrill with  Extremity Pulses:  2+ radial, brachial pulses bilaterally Musculoskeletal: No deformity or edema  Neurologic: Upper and lower extremity motor 5/5 and symmetric  DATA:     ABI Findings:  +-----+------------------+-----+---------+--------+  RightRt Pressure (mmHg)IndexWaveform Comment   +-----+------------------+-----+---------+--------+  PTA 220               1.17 triphasic          +-----+------------------+-----+---------+--------+  DP  226               1.20 triphasic          +-----+------------------+-----+---------+--------+   +---------+------------------+-----+---------+-------+  Left    Lt Pressure (mmHg)IndexWaveform Comment  +---------+------------------+-----+---------+-------+  Brachial 188                                      +---------+------------------+-----+---------+-------+  PTA     205               1.09 triphasic          +---------+------------------+-----+---------+-------+  DP      208               1.11 triphasic         +---------+------------------+-----+---------+-------+  Great Toe231               1.23 Normal            +---------+------------------+-----+---------+-------+   +-------+-----------+-----------+------------+------------+  ABI/TBIToday's ABIToday's TBIPrevious ABIPrevious TBI  +-------+-----------+-----------+------------+------------+  Right 1.20       [place]         [place]          [place]            +-------+-----------+-----------+------------+------------+  Left  1.11       1.23       [place]                      +-------+-----------+-----------+------------+------------+      Summary:  Right: Resting right ankle-brachial index is within normal range.  Toe-Brachial index not compressible.   Left: Resting left ankle-brachial index is within normal range. The left  toe-brachial index is normal.   ASSESSMENT/PLAN: ESRD She has a working right UE fistula without skin erosions, no prolonged bleeding since her venosplasty.  I would not recommend intervention on the aneurysm unless she develops problems.  There is a risk of the fistula failing after surgical manipulation.  There is a good audible thrill and minimal pulsatility at the aneurysm site today on exam.   If she has problems or concerns about her right UE HD access she can call and we will repeat a fistulogram.    Asymptomatic PAD  Her LE ABI's demonstrate triphasic blood flow with falsely elevated index to show calcification.  She has adequate blood flow to heal a right TKA.  F/U PRN    Mosetta Pigeon PA-C [facility] [place] Office: [phone]  MD in clinic [place]

## 2023-06-30 ENCOUNTER — Telehealth: Payer: Self-pay | Admitting: Obstetrics and Gynecology

## 2023-06-30 NOTE — Telephone Encounter (Signed)
   Request for surgical clearance:  What type of surgery is being performed?  Total laparoscopic hysterectomy, bilateral salpingectomy, cystoscopy   When is this surgery scheduled? Pending clearance   Are there any medications that need to be held prior to surgery and how long? Azor day of    Name of physician performing surgery? Lorriane Shire, MD   What is the office phone and fax number? Phone [phone], fax [phone]   ____________________________________________________   For Provider: Bryan Lemma, MD

## 2023-07-03 ENCOUNTER — Other Ambulatory Visit (HOSPITAL_COMMUNITY): Payer: Self-pay

## 2023-07-03 MED ORDER — OXYCODONE-ACETAMINOPHEN 10-325 MG PO TABS
1.0000 | ORAL_TABLET | Freq: Every day | ORAL | 0 refills | Status: DC | PRN
  Filled 2023-07-03: qty 150, 30d supply, fill #0

## 2023-07-03 NOTE — Telephone Encounter (Signed)
   Pre-operative Risk Assessment    Patient Name: Jane [name]  DOB: 1976-09-16 MRN: [id number]      Request for Surgical Clearance    Procedure:   total laparoscopic hysterectomy, bilateral salpingectomy, cystoscopy  Date of Surgery:  Clearance TBD                                 Surgeon:  Dr Vanessa Kick Surgeon's Group or Practice Name:  [facility] Phone number:  [phone] Fax number:  [phone]   Type of Clearance Requested:   - Medical   - hold Azor day of procedure   Type of Anesthesia:  Not Indicated   Additional requests/questions:    Cala Bradford   07/03/2023, 3:39 PM

## 2023-07-03 NOTE — Telephone Encounter (Signed)
     Primary Cardiologist: Bryan Lemma, MD  Chart reviewed as part of pre-operative protocol coverage. Given past medical history and time since last visit, based on ACC/AHA guidelines, Jane [name] would be at acceptable risk for the planned procedure without further cardiovascular testing.   Chart reviewed as part of pre-operative protocol coverage. Given past medical history and time since last visit, based on ACC/AHA guidelines, Jane [name] would be at acceptable risk for the planned procedure without further cardiovascular testing. She is able to exceed 4 METS of activity. Her RCRI is a Class IV risk with a 15.0%    Patients Azor as prescribed by neurology.  Recommendations for holding medication will need to come from prescribing provider.  I will route this recommendation to the requesting party via Epic fax function and remove from pre-op pool.  Please call with questions.  Thomasene Ripple. [name] NP-C     07/03/2023, 4:05 PM [facility] [address] Office [phone] Fax [phone]

## 2023-07-04 ENCOUNTER — Ambulatory Visit: Payer: Self-pay | Admitting: Student

## 2023-07-04 DIAGNOSIS — Z794 Long term (current) use of insulin: Secondary | ICD-10-CM

## 2023-07-06 NOTE — Progress Notes (Signed)
Surgical Instructions    Your procedure is scheduled on July 19, 2023.  Report to [facility] Main Entrance "A" at 11:00 A.M., then check in with the Admitting office.  Call this number if you have problems the morning of surgery:  [phone]   If you have any questions prior to your surgery date call [phone]: Open Monday-Friday 8am-4pm If you experience any cold or flu symptoms such as cough, fever, chills, shortness of breath, etc. between now and your scheduled surgery, please notify us at the above number     Remember:  Do not eat after midnight the night before your surgery  You may drink clear liquids until 10:00AM the morning of your surgery.   Clear liquids allowed are: Water, Non-Citrus Juices (without pulp), Carbonated Beverages, Clear Tea, Black Coffee ONLY (NO MILK, CREAM OR POWDERED CREAMER of any kind), and Gatorade  Patient Instructions  The night before surgery:  No food after midnight. ONLY clear liquids after midnight   The day of surgery (if you have diabetes): Drink ONE (1) 12 oz G2 given to you in your pre admission testing appointment by 10:00AM the morning of surgery. Drink in one sitting. Do not sip.  This drink was given to you during your hospital  pre-op appointment visit.  Nothing else to drink after completing the  12 oz bottle of G2.         If you have questions, please contact your surgeon's office.     Take these medicines the morning of surgery with A SIP OF WATER:  dicyclomine (BENTYL)  DULoxetine (CYMBALTA) metoprolol tartrate (LOPRESSOR)  rosuvastatin (CRESTOR)   If needed: acetaminophen (TYLENOL)  albuterol (PROVENTIL HFA) 108 (90 Base) MCG/ACT inhaler  cetirizine (ZYRTEC)  oxyCODONE-acetaminophen (PERCOCET)  promethazine (PHENERGAN)   Follow your surgeon's instructions on when to stop Aspirin.  If no instructions were given by your surgeon then you will need to call the office to get those instructions.    Follow your  surgeon's instructions on when/whether to stop taking leflunomide (ARAVA) .  If no instructions were given by your surgeon then you will need to call the office to get those instructions.     As of today, STOP taking any Aleve, Naproxen, Ibuprofen, Motrin, Advil, Goody's, BC's, all herbal medications, fish oil, and all vitamins.          WHAT DO I DO ABOUT MY DIABETES MEDICATION?   Do not take oral diabetes medicines (pills) the morning of surgery.  THE NIGHT BEFORE SURGERY, take 12 units (half dose) of Tresiba insulin (insulin degludec)     The day of surgery, do not take other diabetes injectables, including Byetta (exenatide), Bydureon (exenatide ER), Victoza (liraglutide), or Trulicity (dulaglutide).  If your CBG is greater than 220 mg/dL, you may take  of your sliding scale (correction) dose of insulin.   HOW TO MANAGE YOUR DIABETES BEFORE AND AFTER SURGERY  Why is it important to control my blood sugar before and after surgery? Improving blood sugar levels before and after surgery helps healing and can limit problems. A way of improving blood sugar control is eating a healthy diet by:  Eating less sugar and carbohydrates  Increasing activity/exercise  Talking with your doctor about reaching your blood sugar goals High blood sugars (greater than 180 mg/dL) can raise your risk of infections and slow your recovery, so you will need to focus on controlling your diabetes during the weeks before surgery. Make sure that the doctor who takes care  of your diabetes knows about your planned surgery including the date and location.  How do I manage my blood sugar before surgery? Check your blood sugar at least 4 times a day, starting 2 days before surgery, to make sure that the level is not too high or low.  Check your blood sugar the morning of your surgery when you wake up and every 2 hours until you get to the Short Stay unit.  If your blood sugar is less than 70 mg/dL, you will need  to treat for low blood sugar: Do not take insulin. Treat a low blood sugar (less than 70 mg/dL) with  cup of clear juice (cranberry or apple), 4 glucose tablets, OR glucose gel. Recheck blood sugar in 15 minutes after treatment (to make sure it is greater than 70 mg/dL). If your blood sugar is not greater than 70 mg/dL on recheck, call [phone] for further instructions. Report your blood sugar to the short stay nurse when you get to Short Stay.  If you are admitted to the hospital after surgery: Your blood sugar will be checked by the staff and you will probably be given insulin after surgery (instead of oral diabetes medicines) to make sure you have good blood sugar levels. The goal for blood sugar control after surgery is 80-180 mg/dL.   [facility] is not responsible for any belongings or valuables.    Do NOT Smoke (Tobacco/Vaping)  24 hours prior to your procedure  If you use a CPAP at night, you may bring your mask for your overnight stay.   Contacts, glasses, hearing aids, dentures or partials may not be worn into surgery, please bring cases for these belongings   For patients admitted to the hospital, discharge time will be determined by your treatment team.   Patients discharged the day of surgery will not be allowed to drive home, and someone needs to stay with them for 24 hours.   SURGICAL WAITING ROOM VISITATION Patients having surgery or a procedure may have no more than 2 support people in the waiting area - these visitors may rotate.   Children under the age of 58 must have an adult with them who is not the patient. If the patient needs to stay at the hospital during part of their recovery, the visitor guidelines for inpatient rooms apply. Pre-op nurse will coordinate an appropriate time for 1 support person to accompany patient in pre-op.  This support person may not rotate.   Please refer to [email or web address] for  the visitor guidelines for Inpatients (after your surgery is over and you are in a regular room).    Special instructions:    Oral Hygiene is also important to reduce your risk of infection.  Remember - BRUSH YOUR TEETH THE MORNING OF SURGERY WITH YOUR REGULAR TOOTHPASTE     Pre-operative 5 CHG Bath Instructions   You can play a key role in reducing the risk of infection after surgery. Your skin needs to be as free of germs as possible. You can reduce the number of germs on your skin by washing with CHG (chlorhexidine gluconate) soap before surgery. CHG is an antiseptic soap that kills germs and continues to kill germs even after washing.   DO NOT use if you have an allergy to chlorhexidine/CHG or antibacterial soaps. If your skin becomes reddened or irritated, stop using the CHG and notify one of our RNs at [phone].   Please shower with the CHG soap starting 4 days before surgery  using the following schedule:     Please keep in mind the following:  DO NOT shave, including legs and underarms, starting the day of your first shower.   You may shave your face at any point before/day of surgery.  Place clean sheets on your bed the day you start using CHG soap. Use a clean washcloth (not used since being washed) for each shower. DO NOT sleep with pets once you start using the CHG.   CHG Shower Instructions:  If you choose to wash your hair and private area, wash first with your normal shampoo/soap.  After you use shampoo/soap, rinse your hair and body thoroughly to remove shampoo/soap residue.  Turn the water OFF and apply about 3 tablespoons (45 ml) of CHG soap to a CLEAN washcloth.  Apply CHG soap ONLY FROM YOUR NECK DOWN TO YOUR TOES (washing for 3-5 minutes)  DO NOT use CHG soap on face, private areas, open wounds, or sores.  Pay special attention to the area where your surgery is being performed.  If you are having back surgery, having someone wash your back for you may be  helpful. Wait 2 minutes after CHG soap is applied, then you may rinse off the CHG soap.  Pat dry with a clean towel  Put on clean clothes/pajamas   If you choose to wear lotion, please use ONLY the CHG-compatible lotions on the back of this paper.     Additional instructions for the day of surgery: DO NOT APPLY any lotions, deodorants, cologne, or perfumes.   Put on clean/comfortable clothes.  Brush your teeth.  Ask your nurse before applying any prescription medications to the skin. Do not wear jewelry or makeup. Men may shave face and neck. Do not bring valuables to the hospital. Do not wear nail polish, gel polish, artificial nails, or any other type of covering on natural nails (fingers and toes) If you have artificial nails or gel coating that need to be removed by a nail salon, please have this removed prior to surgery. Artificial nails or gel coating may interfere with anesthesia's ability to adequately monitor your vital signs.     CHG Compatible Lotions   Aveeno Moisturizing lotion  Cetaphil Moisturizing Cream  Cetaphil Moisturizing Lotion  Clairol Herbal Essence Moisturizing Lotion, Dry Skin  Clairol Herbal Essence Moisturizing Lotion, Extra Dry Skin  Clairol Herbal Essence Moisturizing Lotion, Normal Skin  Curel Age Defying Therapeutic Moisturizing Lotion with Alpha Hydroxy  Curel Extreme Care Body Lotion  Curel Soothing Hands Moisturizing Hand Lotion  Curel Therapeutic Moisturizing Cream, Fragrance-Free  Curel Therapeutic Moisturizing Lotion, Fragrance-Free  Curel Therapeutic Moisturizing Lotion, Original Formula  Eucerin Daily Replenishing Lotion  Eucerin Dry Skin Therapy Plus Alpha Hydroxy Crme  Eucerin Dry Skin Therapy Plus Alpha Hydroxy Lotion  Eucerin Original Crme  Eucerin Original Lotion  Eucerin Plus Crme Eucerin Plus Lotion  Eucerin TriLipid Replenishing Lotion  Keri Anti-Bacterial Hand Lotion  Keri Deep Conditioning Original Lotion Dry Skin Formula  Softly Scented  Keri Deep Conditioning Original Lotion, Fragrance Free Sensitive Skin Formula  Keri Lotion Fast Absorbing Fragrance Free Sensitive Skin Formula  Keri Lotion Fast Absorbing Softly Scented Dry Skin Formula  Keri Original Lotion  Keri Skin Renewal Lotion Keri Silky Smooth Lotion  Keri Silky Smooth Sensitive Skin Lotion  Nivea Body Creamy Conditioning Oil  Nivea Body Extra Enriched Teacher, adult education Moisturizing Lotion Nivea Crme  Nivea Skin Firming Lotion  NutraDerm 30 Skin Lotion  NutraDerm Skin Lotion  NutraDerm Therapeutic Skin Cream  NutraDerm Therapeutic Skin Lotion  ProShield Protective Hand Cream  Provon moisturizing lotion     If you received a COVID test during your pre-op visit, it is requested that you wear a mask when out in public, stay away from anyone that may not be feeling well, and notify your surgeon if you develop symptoms. If you have been in contact with anyone that has tested positive in the last 10 days, please notify your surgeon.    Please read over the following fact sheets that you were given.

## 2023-07-07 ENCOUNTER — Other Ambulatory Visit: Payer: Self-pay

## 2023-07-07 ENCOUNTER — Encounter (HOSPITAL_COMMUNITY)
Admission: RE | Admit: 2023-07-07 | Discharge: 2023-07-07 | Disposition: A | Discharge status: Home / Self Care | Payer: 59 | Source: Ambulatory Visit | Attending: Orthopedic Surgery | Admitting: Orthopedic Surgery

## 2023-07-07 ENCOUNTER — Encounter (HOSPITAL_COMMUNITY): Payer: Self-pay

## 2023-07-07 VITALS — BP 161/77 | HR 91 | Temp 98.3°F | Resp 18 | Ht 65.0 in | Wt 211.2 lb

## 2023-07-07 DIAGNOSIS — E113413 Type 2 diabetes mellitus with severe nonproliferative diabetic retinopathy with macular edema, bilateral: Secondary | ICD-10-CM | POA: Insufficient documentation

## 2023-07-07 DIAGNOSIS — M1712 Unilateral primary osteoarthritis, left knee: Secondary | ICD-10-CM | POA: Insufficient documentation

## 2023-07-07 DIAGNOSIS — I10 Essential (primary) hypertension: Secondary | ICD-10-CM | POA: Diagnosis not present

## 2023-07-07 DIAGNOSIS — Z01818 Encounter for other preprocedural examination: Secondary | ICD-10-CM | POA: Diagnosis present

## 2023-07-07 DIAGNOSIS — Z01812 Encounter for preprocedural laboratory examination: Secondary | ICD-10-CM | POA: Diagnosis not present

## 2023-07-07 DIAGNOSIS — Z794 Long term (current) use of insulin: Secondary | ICD-10-CM | POA: Diagnosis not present

## 2023-07-07 HISTORY — DX: Bipolar disorder, unspecified: F31.9

## 2023-07-07 HISTORY — DX: Anxiety disorder, unspecified: F41.9

## 2023-07-07 HISTORY — DX: Personal history of other medical treatment: Z92.89

## 2023-07-07 LAB — HEMOGLOBIN A1C
Hgb A1c MFr Bld: 7.7 % — ABNORMAL HIGH (ref 4.8–5.6)
Mean Plasma Glucose: 174.29 mg/dL

## 2023-07-07 LAB — SURGICAL PCR SCREEN
MRSA, PCR: NEGATIVE
Staphylococcus aureus: NEGATIVE

## 2023-07-07 LAB — GLUCOSE, CAPILLARY: Glucose-Capillary: 217 mg/dL — ABNORMAL HIGH (ref 70–99)

## 2023-07-07 NOTE — Progress Notes (Addendum)
PCP - Dr. Katheran James  Cardiologist - Dr. Herbie Baltimore  EP- no  Endocrine-  Pulm-no  Rheumatologist- Dr. Thana Farr - instructed Ms McCullogh to hold Arava 1 week prior to surgery.  Chest x-ray -  na EKG - 06/14/23  Stress Test - 09/13/18  ECHO - 02/19/23  Cardiac Cath - 03/31/16  AICD-no PM-no LOOP-no  Nerve Stimulator-no  Dialysis- Yes  Sleep Study - no CPAP - no  LABS- A1C, PCR.  [facility] and [facility]- they were drawn on 06/30/23- chemistries- I did not see Glucose, CBC- drawn 8/16 /24 and 07/05/23  I am asking anesthesia PA-C to review.   ASA- ASA- instructed patient to call surgeon's office.  ERAS-yes  HA1C-06/16/23- 7.6 Dr . Lenna Gilford A1C- 07/07/23- 7.7. GLP-1- no Fasting Blood Sugar - 120-150  Checks Blood Sugar _____ times a day- has a continuous glucose reader- it will not stay on enough days.  Patient states she will  Purchase a manuel glucose meter an begin checking CBG. Anesthesia-  Pt denies having chest pain, sob, or fever at this time. All instructions explained to the pt, with a verbal understanding of the material. Pt agrees to go over the instructions while at home for a better understanding. The opportunity to ask questions was provided.

## 2023-07-10 NOTE — Progress Notes (Incomplete)
Anesthesia Chart Review:  Case: [id number] Date/Time: 07/19/23 1245   Procedure: COMPUTER ASSISTED TOTAL KNEE ARTHROPLASTY (Left: Knee) - 150   Anesthesia type: Spinal   Pre-op diagnosis: Left knee osteoarthritis   [facility] / [facility]   Surgeons: Samson Frederic, MD       DISCUSSION: Patient is a 47 year old female scheduled for the above procedure.   History includes former smoker (quit 02/28/15), PONV, HTN, CAD (BMS PCI to prox LAD in setting of NSTEMI 03/31/16), murmur, chronic diastolic heart failure, DM insulin dependent w9th retinopathy, neuropathy, nephropathy), ESRD (dialysis MWF; right brachiocephalic AVF 07/31/17), Bipolar disorder, Sickle cell trait, anemia, asthma. S/p cystoscopy with bladder biopsy on 08/30/22 with benign pathology.       Preoperative cardiology input outlined by Edd Fabian, NP on 07/03/23: "Given past medical history and time since last visit, based on ACC/AHA guidelines, Jane [name] would be at acceptable risk for the planned procedure without further cardiovascular testing.    Chart reviewed as part of pre-operative protocol coverage. Given past medical history and time since last visit, based on ACC/AHA guidelines, Jane [name] would be at acceptable risk for the planned procedure without further cardiovascular testing. She is able to exceed 4 METS of activity. Her RCRI is a Class IV risk with a 15.0%."    VS: BP (!) 161/77   Pulse 91   Temp 36.8 C (Oral)   Resp 18   Ht 5\' 5"  (1.651 m)   Wt 95.8 kg   LMP 05/19/2023 Comment: HAVING ABNORMAL BLEEDING  07/07/23  SpO2 100%   BMI 35.15 kg/m   PROVIDERS: Katheran James, DO is PCP  Bryan Lemma, MD is cardiologist  Kasandra Knudsen, MD is urologist   LABS: {CHL AN LABS REVIEWED:112001::"Labs reviewed: Acceptable for surgery."} (all labs ordered are listed, but only abnormal results are displayed)  Labs Reviewed  GLUCOSE, CAPILLARY - Abnormal; Notable for the  following components:      Result Value   Glucose-Capillary 217 (*)    All other components within normal limits  HEMOGLOBIN A1C - Abnormal; Notable for the following components:   Hgb A1c MFr Bld 7.7 (*)    All other components within normal limits  SURGICAL PCR SCREEN     IMAGES: CT Left Knee 03/24/2023: IMPRESSION: Severe degenerative changes of the knee joint which correspond to those changes seen on recent plain film. Associated large joint effusion is noted.  EKG: 06/14/2023:  Normal sinus rhythm Anterior infarct (cited on or before 28-Feb-2022) No acute changes Confirmed by Reather Littler [phone]) on 06/14/2023 8:32:22 AM   CV: Long Term Zio Monitor 07/27/2021 - 07/30/2021:  Zio patch worn for 3 days, 5 hours (September 13-16, 2022) Patient was in sinus rhythm during entire recording duration. Heart rate range 69 to 119 bpm with an average of 89 bpm. Rare PACs and PVCs with rare couplets above. No ventricular bigeminy or trigeminy noted. No arrhythmias (either fast or slow) noted. Symptoms noted during sinus rhythm. Occasionally was during dialysis. No arrhythmias noted.   Completely normal study.    Echo 02/18/2021:  1. Mild septal hypertrophy with moderate posterior hypertrophy. Left  ventricular ejection fraction, by estimation, is 55 to 60%. The left  ventricle has normal function. The left ventricle has no regional wall  motion abnormalities. There is mild left  ventricular hypertrophy. Left ventricular diastolic parameters are  indeterminate. The average left ventricular global longitudinal strain is  -21.0 %. The global longitudinal strain  is normal.   2. Right ventricular systolic function is normal. The right ventricular  size is normal. There is normal pulmonary artery systolic pressure.   3. The mitral valve is normal in structure. Trivial mitral valve  regurgitation. No evidence of mitral stenosis.   4. The aortic valve is tricuspid. Aortic valve regurgitation  is not  visualized. No aortic stenosis is present.   5. The inferior vena cava is normal in size with greater than 50%  respiratory variability, suggesting right atrial pressure of 3 mmHg.     Myocardial Perfusion 09/13/2018: The left ventricular ejection fraction is normal (55-65%). Nuclear stress EF: 56%. There was no ST segment deviation noted during stress. No T wave inversion was noted during stress. The study is normal. This is a low risk study.   Low risk stress nuclear study with normal perfusion and normal left ventricular regional and global systolic function.  Past Medical History:  Diagnosis Date   Abnormal uterine bleeding (AUB)    Anxiety    Arthritis of knee    Left, Gel and cortisone injections @ [facility]   Asthma    prn inhaler   Bipolar disorder (HCC)    CAD S/P BMS PCI to prox LAD cardiologist--- dr Judithann Sauger LAD to Mid LAD lesion, 90% stenosed. Post intervention - Vision BMS 3.0 mm x 18 mm (~3.5 mm) there is a 0% residual stenosis. 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   left 2018; right 2019   Depression    Diabetic foot ulcer (HCC) 04/22/2019   Diabetic neuropathy (HCC)    feet   Diabetic retinopathy, nonproliferative, severe (HCC)    bilateral   Dyspnea    with too much fluid   ESRD (end stage renal disease) on dialysis [facility])    ?chronic interstitial nephritis  ([facility]   H/O non-ST elevation myocardial infarction (NSTEMI) 03/2016   Found in 90% mid LAD lesion treated with bare-metal stent (BMS) PCI - vision BMS 3.0 mm x 18 mm   Heart murmur    History of blood transfusion    History of MRSA infection 2007   Hyperlipidemia    Hypertension    IDA (iron deficiency anemia)    Insulin dependent type 2 diabetes mellitus (HCC)    Iron deficiency anemia    takes iron supplement   Myocardial infarction [facility])    Neuropathic  arthropathy due to type 2 diabetes mellitus (HCC) 05/23/2018   PAOD (peripheral arterial occlusive disease) (HCC)    vascular--- dr Imogene Burn   Pneumonia 11/08/2021   PONV (postoperative nausea and vomiting)    S/P arteriovenous (AV) fistula creation 07/2017   S/p bare metal coronary artery stent 03/31/2016   BMS x1  to pLAD   Sickle cell trait (HCC)    Ulcer of left foot due to type 2 diabetes mellitus (HCC) 06/03/2019    Past Surgical History:  Procedure Laterality Date   A/V FISTULAGRAM N/A 04/13/2021   Procedure: A/V FISTULAGRAM - Right Arm;  Surgeon: Nada Libman, MD;  [facility] INVASIVE CV LAB;  Service: Cardiovascular;  Laterality: N/A;   ACHILLES TENDON LENGTHENING Left 11/24/2016   Procedure: Left Achilles tendon lengthening (open);  Surgeon: Toni Arthurs, MD;  Location: [facility];  Service: Orthopedics;  Laterality: Left;   AV FISTULA PLACEMENT Right 07/31/2017   Procedure: ARTERIOVENOUS BRACHIOCEPHALIC FISTULA CREATION  RIGHT ARM;  Surgeon: Fransisco Hertz, MD;  Location: [facility] OR;  Service: Vascular;  Laterality: Right;   CALCANEAL OSTEOTOMY Left 11/24/2016   Procedure: Left hindfoot osteotomy and fusion;  Surgeon: Toni Arthurs, MD;  Location: [facility];  Service: Orthopedics;  Laterality: Left;   CARDIAC CATHETERIZATION N/A 03/31/2016   Procedure: Left Heart Cath and Coronary Angiography;  Surgeon: Runell Gess, MD;  Location: [facility] INVASIVE CV LAB: 90% early mLAD, normal LV Fxn   CARDIAC CATHETERIZATION N/A 03/31/2016   Procedure: Coronary Stent Intervention;  Surgeon: Runell Gess, MD;  [facility];  Service: Cardiovascular: PCI to mLAD BMS Vision 3.0 mm x 18 mm   CESAREAN SECTION  1997   CYSTOSCOPY WITH FULGERATION N/A 08/30/2022   Procedure: CYSTOSCOPY WITH FULGERATION, BIOPSY, LEFT RETROGRADE PYELOGRAM;  Surgeon: Noel Christmas, MD;  Location: [facility] ORS;  Service: Urology;  Laterality: N/A;   DILITATION &  CURRETTAGE/HYSTROSCOPY WITH NOVASURE ABLATION N/A 09/15/2020   Procedure: DILATATION & CURETTAGE/HYSTEROSCOPY;  Surgeon: Willodean Rosenthal, MD;  Location: [facility];  Service: Gynecology;  Laterality: N/A;   DILITATION & CURRETTAGE/HYSTROSCOPY WITH NOVASURE ABLATION N/A 12/21/2021   Procedure: DILATATION & CURETTAGE/HYSTEROSCOPY WITH NOVASURE ABLATION/ IUD REMOVAL;  Surgeon: Willodean Rosenthal, MD;  Location: [facility];  Service: Gynecology;  Laterality: N/A;   FISTULA SUPERFICIALIZATION Right 11/15/2017   Procedure: FISTULA SUPERFICIALIZATION RIGHT BRACHIOCEPHALIC  RIGHT;  Surgeon: Fransisco Hertz, MD;  Location: [facility] OR;  Service: Vascular;  Laterality: Right;   FOOT SURGERY     multiple for charcot   PERIPHERAL VASCULAR BALLOON ANGIOPLASTY Right 04/13/2021   Procedure: PERIPHERAL VASCULAR BALLOON ANGIOPLASTY;  Surgeon: Nada Libman, MD;  [facility];  Service: Cardiovascular;  Laterality: Right;  arm fistula   TRANSTHORACIC ECHOCARDIOGRAM  02/2021   EF 55 to 60%.  No R WMA.  Mild septal LVH with moderate posterior hypertrophy.  Normal RV size and function.  Normal RVP/PASP.Marland Kitchen  Trivial MR.   TUBAL LIGATION  1997    MEDICATIONS:  alfuzosin (UROXATRAL) 10 MG 24 hr tablet   Accu-Chek Softclix Lancets lancets   acetaminophen (TYLENOL) 500 MG tablet   albuterol (PROVENTIL HFA) 108 (90 Base) MCG/ACT inhaler   amLODipine-olmesartan (AZOR) 5-20 MG tablet   aspirin EC 81 MG tablet   blood glucose meter kit and supplies   blood glucose meter kit and supplies   Blood Glucose Monitoring Suppl (ACCU-CHEK GUIDE) w/Device KIT   cetirizine (ZYRTEC) 10 MG tablet   Cholecalciferol (VITAMIN D) 125 MCG (5000 UT) CAPS   Cholecalciferol (VITAMIN D3) 50 MCG (2000 UT) CAPS   Continuous Glucose Receiver (DEXCOM G7 RECEIVER) DEVI   Continuous Glucose Sensor (DEXCOM G7 SENSOR) MISC   dicyclomine (BENTYL) 10 MG capsule   DULoxetine (CYMBALTA) 30 MG  capsule   estradiol (ESTRACE VAGINAL) 0.1 MG/GM vaginal cream   gabapentin (NEURONTIN) 100 MG capsule   glucose blood (ACCU-CHEK GUIDE) test strip   Glucose Blood (BLOOD GLUCOSE TEST STRIPS) STRP   guanFACINE (INTUNIV) 1 MG TB24 ER tablet   icosapent Ethyl (VASCEPA) 1 g capsule   injection device for insulin (INPEN 100-PINK-LILLY-HUMALOG) DEVI   insulin degludec (TRESIBA) 100 UNIT/ML FlexTouch Pen   insulin lispro (HUMALOG KWIKPEN) 100 UNIT/ML KwikPen   insulin lispro (HUMALOG) 100 UNIT/ML cartridge   Insulin Pen Needle 32G X 4 MM MISC   Insulin Syringe-Needle U-100 31G X 15/64" 0.3 ML MISC   Lancet Device MISC   Lancets [facility]  DELICA PLUS LANCET33G) MISC   lanthanum (FOSRENOL) 1000 MG chewable tablet   leflunomide (ARAVA) 10 MG tablet   megestrol (MEGACE) 40 MG tablet   metoprolol tartrate (LOPRESSOR) 25 MG tablet   Multiple Vitamins-Minerals (HAIR SKIN & NAILS) TABS   multivitamin (RENA-VIT) TABS tablet   oxyCODONE-acetaminophen (PERCOCET) 10-325 MG tablet   oxyCODONE-acetaminophen (PERCOCET) 10-325 MG tablet   polyethylene glycol (MIRALAX / GLYCOLAX) 17 g packet   promethazine (PHENERGAN) 12.5 MG tablet   rosuvastatin (CRESTOR) 40 MG tablet   sevelamer carbonate (RENVELA) 800 MG tablet   Sodium Fluoride 1.1 % PSTE    polyethylene glycol powder (GLYCOLAX/MIRALAX) container 255 g

## 2023-07-11 ENCOUNTER — Ambulatory Visit: Payer: Self-pay | Admitting: Student

## 2023-07-11 NOTE — Anesthesia Preprocedure Evaluation (Addendum)
Anesthesia Evaluation  Patient identified by MRN, date of birth, ID band Patient awake    Reviewed: Allergy & Precautions, NPO status , Patient's Chart, lab work & pertinent test results, reviewed documented beta blocker date and time   History of Anesthesia Complications (+) PONV and history of anesthetic complications  Airway Mallampati: II  TM Distance: >3 FB     Dental no notable dental hx.    Pulmonary shortness of breath and with exertion, asthma , pneumonia, resolved, neg recent URI, former smoker, neg PE   breath sounds clear to auscultation       Cardiovascular hypertension, + CAD, + Past MI, + Cardiac Stents, + Peripheral Vascular Disease and +CHF  + Valvular Problems/Murmurs  Rhythm:Regular Rate:Normal  Echo WNL   Neuro/Psych neg Seizures PSYCHIATRIC DISORDERS Anxiety Depression Bipolar Disorder    Neuromuscular disease    GI/Hepatic ,neg GERD  ,,(+) neg Cirrhosis        Endo/Other  diabetes, Poorly Controlled, Type 2, Insulin Dependent    Renal/GU ESRF and DialysisRenal disease     Musculoskeletal  (+) Arthritis ,    Abdominal   Peds  Hematology  (+) Blood dyscrasia, anemia   Anesthesia Other Findings   Reproductive/Obstetrics                              Anesthesia Physical Anesthesia Plan  ASA: 3  Anesthesia Plan: Regional and Spinal   Post-op Pain Management:    Induction:   PONV Risk Score and Plan: 3 and Propofol infusion and Ondansetron  Airway Management Planned:   Additional Equipment:   Intra-op Plan:   Post-operative Plan:   Informed Consent: I have reviewed the patients History and Physical, chart, labs and discussed the procedure including the risks, benefits and alternatives for the proposed anesthesia with the patient or authorized representative who has indicated his/her understanding and acceptance.     Dental advisory given  Plan Discussed  with: CRNA  Anesthesia Plan Comments: (PAT note written 07/11/2023 by Shonna Chock, PA-C.  )        Anesthesia Quick Evaluation

## 2023-07-12 ENCOUNTER — Ambulatory Visit (INDEPENDENT_AMBULATORY_CARE_PROVIDER_SITE_OTHER): Payer: 59 | Admitting: Licensed Clinical Social Worker

## 2023-07-12 DIAGNOSIS — F321 Major depressive disorder, single episode, moderate: Secondary | ICD-10-CM

## 2023-07-12 NOTE — BH Specialist Note (Unsigned)
Integrated Behavioral Health via Telemedicine Visit  07/12/2023 [name] [id number]  Number of Integrated Behavioral Health Clinician visits: 1- Initial Visit  Session Start time: 1530   Session End time: 1600  Total time in minutes: 30   Referring Provider: Gust Rung, DO Patient/Family location: Home [facility] Provider location: Office All persons participating in visit: [facility] and Patient Types of Service: Telephone visit and Introduction only  I connected with [name] and/or [name]'s  via  Telephone or Video Enabled Telemedicine Application  (Video is Caregility application) and verified that I am speaking with the correct person using two identifiers. Discussed confidentiality: Yes   I discussed the limitations of telemedicine and the availability of in person appointments.  Discussed there is a possibility of technology failure and discussed alternative modes of communication if that failure occurs.  I discussed that engaging in this telemedicine visit, they consent to the provision of behavioral healthcare and the services will be billed under their insurance.  Patient and/or legal guardian expressed understanding and consented to Telemedicine visit: Yes   [facility] introduced self and explained services. Patient stated she understood and agreed to services. Patient Denied SI. Patient prefers in person visits. Patient scheduled for 09/12 at 2:30pm.  I discussed the assessment and treatment plan with the patient and/or parent/guardian. They were provided an opportunity to ask questions and all were answered. They agreed with the plan and demonstrated an understanding of the instructions.   They were advised to call back or seek an in-person evaluation if the symptoms worsen or if the condition fails to improve as anticipated.  Christen Butter, MSW, LCSW-A She/Her Behavioral Health Clinician [facility]  Internal Medicine Center Direct  Dial:[phone]  Fax [phone] Main Office Phone: [phone] [address] [place] [place], [place] [address] Website: [facility] Internal Medicine [facility]  [name], [place]  [place]

## 2023-07-13 ENCOUNTER — Ambulatory Visit: Payer: Self-pay | Admitting: Student

## 2023-07-13 NOTE — Progress Notes (Signed)
I reviewed the AWV findings with the provider who conducted the visit. I was present in the office suite and immediately available to provide assistance and direction throughout the time the service was provided.  

## 2023-07-13 NOTE — H&P (Addendum)
TOTAL KNEE ADMISSION H&P  Patient is being admitted for left total knee arthroplasty.  Subjective:  Chief Complaint:left knee pain.  HPI: [name], 47 y.o. female, has a history of pain and functional disability in the left knee due to arthritis and has failed non-surgical conservative treatments for greater than 12 weeks to includeNSAID's and/or analgesics, corticosteriod injections, flexibility and strengthening excercises, use of assistive devices, and activity modification.  Onset of symptoms was gradual, starting 9 years ago with rapidlly worsening course since that time. The patient noted no past surgery on the left knee(s).  Patient currently rates pain in the left knee(s) at 10 out of 10 with activity. Patient has night pain, worsening of pain with activity and weight bearing, pain that interferes with activities of daily living, pain with passive range of motion, crepitus, and joint swelling.  Patient has evidence of subchondral cysts, subchondral sclerosis, periarticular osteophytes, joint space narrowing, and bone loss  by imaging studies. There is no active infection.  Patient Active Problem List   Diagnosis Date Noted   Encounter for perioperative consultation 06/26/2023   Abnormal hematology test result 04/12/2023   Hematuria 05/26/2022   Irritable bowel syndrome (IBS) 06/03/2021   Diabetic neuropathy (HCC) 01/29/2021   Hypertensive heart disease with chronic diastolic congestive heart failure (HCC) 01/06/2021   Osteoarthritis of left knee 02/11/2019   Nausea in adult 11/30/2018   Hidradenitis suppurativa 02/12/2018   Anemia in chronic kidney disease 02/12/2018   Secondary hyperparathyroidism of renal origin (HCC) 01/30/2018   Major depressive disorder 08/11/2017   Sinus tachycardia 02/23/2017   ESRD (end stage renal disease) on dialysis (HCC) 01/05/2017   CAD S/P BMS PCI to prox LAD 03/31/2016   Abnormal uterine bleeding (AUB) 03/17/2016   PAOD (peripheral  arterial occlusive disease) (HCC) 01/08/2016   Charcot foot due to diabetes mellitus (HCC) 09/07/2015   Essential hypertension 08/16/2014   Seasonal allergic rhinitis 08/16/2014   Morbid obesity (HCC) 09/26/2012   Hyperlipidemia associated with type 2 diabetes mellitus (HCC) 02/26/2009   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 Post intervention - Vision BMS 3.0 mm x 18 mm (~3.5 mm) there is a 0% residual stenosis. 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   left 2018; right 2019   Depression    Diabetic foot ulcer (HCC) 04/22/2019   Diabetic neuropathy (HCC)    feet   Diabetic retinopathy, nonproliferative, severe (HCC)    bilateral   Dyspnea    with too much fluid   ESRD (end stage renal disease) on dialysis [facility])    ?chronic interstitial nephritis  ([facility]   H/O non-ST elevation myocardial infarction (NSTEMI) 03/2016   Found in 90% mid LAD lesion treated with bare-metal stent (BMS) PCI - vision BMS 3.0 mm x 18 mm   Heart murmur    History of blood transfusion    History of MRSA infection  2007   Hyperlipidemia    Hypertension    IDA (iron deficiency anemia)    Insulin dependent type 2 diabetes mellitus (HCC)    Iron deficiency anemia    takes iron supplement   Myocardial infarction [facility])    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   PAOD (peripheral arterial occlusive disease) (HCC)    vascular--- dr Imogene Burn   Pneumonia  11/08/2021   PONV (postoperative nausea and vomiting)    S/P arteriovenous (AV) fistula creation 07/2017   S/p bare metal coronary artery stent 03/31/2016   BMS x1  to pLAD   Sickle cell trait (HCC)    Ulcer of left foot due to type 2 diabetes mellitus (HCC) 06/03/2019    Past Surgical History:  Procedure Laterality Date   A/V FISTULAGRAM N/A 04/13/2021   Procedure: A/V FISTULAGRAM - Right Arm;  Surgeon: Nada Libman, MD;  [facility]: [facility] INVASIVE CV LAB;  Service: Cardiovascular;  Laterality: N/A;   ACHILLES TENDON LENGTHENING Left 11/24/2016   Procedure: Left Achilles tendon lengthening (open);  Surgeon: Toni Arthurs, MD;  Location: [facility];  Service: Orthopedics;  Laterality: Left;   AV FISTULA PLACEMENT Right 07/31/2017   Procedure: ARTERIOVENOUS BRACHIOCEPHALIC FISTULA CREATION RIGHT ARM;  Surgeon: Fransisco Hertz, MD;  Location: [facility] OR;  Service: Vascular;  Laterality: Right;   CALCANEAL OSTEOTOMY Left 11/24/2016   Procedure: Left hindfoot osteotomy and fusion;  Surgeon: Toni Arthurs, MD;  Location: [facility];  Service: Orthopedics;  Laterality: Left;   CARDIAC CATHETERIZATION N/A 03/31/2016   Procedure: Left Heart Cath and Coronary Angiography;  Surgeon: Runell Gess, MD;  Location: [facility] INVASIVE CV LAB: 90% early mLAD, normal LV Fxn   CARDIAC CATHETERIZATION N/A 03/31/2016   Procedure: Coronary Stent Intervention;  Surgeon: Runell Gess, MD;  Location: [facility] INVASIVE CV LAB;  Service: Cardiovascular: PCI to mLAD BMS Vision 3.0 mm x 18 mm   CESAREAN SECTION  1997   CYSTOSCOPY WITH FULGERATION N/A 08/30/2022   Procedure: CYSTOSCOPY WITH FULGERATION, BIOPSY, LEFT RETROGRADE PYELOGRAM;  Surgeon: Noel Christmas, MD;  Location: [facility] ORS;  Service: Urology;  Laterality: N/A;   DILITATION & CURRETTAGE/HYSTROSCOPY WITH NOVASURE ABLATION N/A 09/15/2020   Procedure: DILATATION & CURETTAGE/HYSTEROSCOPY;  Surgeon: Willodean Rosenthal, MD;  Location[facility];  Service: Gynecology;  Laterality: N/A;   DILITATION & CURRETTAGE/HYSTROSCOPY WITH NOVASURE ABLATION N/A 12/21/2021   Procedure: DILATATION & CURETTAGE/HYSTEROSCOPY WITH NOVASURE ABLATION/ IUD REMOVAL;  Surgeon: Willodean Rosenthal, MD;  Location: [facility];  Service: Gynecology;  Laterality: N/A;   FISTULA SUPERFICIALIZATION Right 11/15/2017   Procedure: FISTULA SUPERFICIALIZATION RIGHT BRACHIOCEPHALIC  RIGHT;  Surgeon: Fransisco Hertz, MD;  Location: [facility] OR;  Service: Vascular;  Laterality: Right;   FOOT SURGERY     multiple for charcot   PERIPHERAL VASCULAR BALLOON ANGIOPLASTY Right 04/13/2021   Procedure: PERIPHERAL VASCULAR BALLOON ANGIOPLASTY;  Surgeon: Nada Libman, MD;  Location: [facility];  Service: Cardiovascular;  Laterality: Right;  arm fistula   TRANSTHORACIC ECHOCARDIOGRAM  02/2021   EF 55 to 60%.  No R WMA.  Mild septal LVH with moderate posterior hypertrophy.  Normal RV size and function.  Normal RVP/PASP.Marland Kitchen  Trivial MR.   TUBAL LIGATION  1997    Current Outpatient Medications  Medication Sig Dispense Refill Last Dose   Accu-Chek Softclix Lancets lancets Use to check blood sugar up to 3 (three) times daily. 300 each 3  acetaminophen (TYLENOL) 500 MG tablet Take 1,000 mg by mouth every 6 (six) hours as needed for moderate pain.      albuterol (PROVENTIL HFA) 108 (90 Base) MCG/ACT inhaler Inhale 2 puffs into the lungs every 4 (four) hours as needed for coughing, wheezing, or shortness of breath. 20.1 g 2    alfuzosin (UROXATRAL) 10 MG 24 hr tablet Take 10 mg by mouth in the morning and at bedtime.      amLODipine-olmesartan (AZOR) 5-20 MG tablet Take 1 tablet by mouth every night 90 tablet 3    aspirin EC 81 MG tablet Take 1 tablet (81 mg total) by mouth daily. Swallow whole. 90 tablet 3    blood glucose meter kit and supplies Dispense based on patient and insurance preference. Use up to four times daily as directed. (FOR ICD-10  E10.9, E11.9). 1 each 0    blood glucose meter kit and supplies Use in the morning, at noon, and at bedtime. 1 each 0    Blood Glucose Monitoring Suppl (ACCU-CHEK GUIDE) w/Device KIT 1 each by Does not apply route 3 (three) times daily. 1 kit 0    cetirizine (ZYRTEC) 10 MG tablet Take 10 mg by mouth daily as needed for allergies.      Cholecalciferol (VITAMIN D) 125 MCG (5000 UT) CAPS Take 10,000 Units by mouth daily.      Cholecalciferol (VITAMIN D3) 50 MCG (2000 UT) CAPS Take 4 capsules (8,000 Units total) by mouth daily. (Patient not taking: Reported on 07/03/2023) 360 capsule 3    Continuous Glucose Receiver (DEXCOM G7 RECEIVER) DEVI Use  to continuously monitor your blood sugars. 1 each 0    Continuous Glucose Sensor (DEXCOM G7 SENSOR) MISC Use it for 10 days to continuously monitor your blood sugars. 9 each 3    dicyclomine (BENTYL) 10 MG capsule Take 1 capsule (10 mg total) by mouth 3 (three) times daily before meals. 90 capsule 0    DULoxetine (CYMBALTA) 30 MG capsule Take 1 capsule (30 mg total) by mouth daily. 30 capsule 1    estradiol (ESTRACE VAGINAL) 0.1 MG/GM vaginal cream Place 1 Applicatorful vaginally nightly for 2 weeks then 2 nights a week afterwards. 42.5 g 4    gabapentin (NEURONTIN) 100 MG capsule Take 1 capsule (100 mg total) by mouth at bedtime. 90 capsule 1    glucose blood (ACCU-CHEK GUIDE) test strip Use to check blood sugar up to 3 (three) times daily. 300 each 3    Glucose Blood (BLOOD GLUCOSE TEST STRIPS) STRP Check blood sugars fasting and then with each meal up to 4 times a day. 100 strip 0    guanFACINE (INTUNIV) 1 MG TB24 ER tablet Take 1 tablet (1 mg total) by mouth daily. (Patient not taking: Reported on 07/03/2023) 30 tablet 0    icosapent Ethyl (VASCEPA) 1 g capsule Take 2 capsules (2 g total) by mouth 2 (two) times daily. 180 capsule 1    injection device for insulin (INPEN 100-PINK-LILLY-HUMALOG) DEVI Use to inject Humalog insulin for meals and correction insulin  1 each 1    insulin degludec (TRESIBA) 100 UNIT/ML FlexTouch Pen Inject 24 Units into the skin at bedtime. 15 mL 1    insulin lispro (HUMALOG KWIKPEN) 100 UNIT/ML KwikPen Use 6-20 units as directed before meals three times a day 15 mL 3    insulin lispro (HUMALOG) 100 UNIT/ML cartridge Inject 6-20 Units total into the skin 3 (three) times daily with meals. Use with inpen to inject  at meal time and for correction insulin up to 40 units daily (Patient not taking: Reported on 07/03/2023) 15 mL 3    Insulin Pen Needle 32G X 4 MM MISC Use to inject insulin 4 (four) times daily. 400 each 3    Insulin Syringe-Needle U-100 31G X 15/64" 0.3 ML MISC Use to inject insulin daily 100 each 3    Lancet Device MISC Use 1 each in the morning, at noon, and at bedtime. 1 each 0    Lancets (ONETOUCH DELICA PLUS LANCET33G) MISC 1 each in the morning, at noon, and at bedtime. 100 each 0    lanthanum (FOSRENOL) 1000 MG chewable tablet Chew 1 tablet (1,000 mg total) by mouth 3 (three) times daily with meals. 270 tablet 4    leflunomide (ARAVA) 10 MG tablet Take 1 tablet (10 mg total) by mouth daily. 30 tablet 2    megestrol (MEGACE) 40 MG tablet Take 1 tablet (40 mg total) by mouth 2 (two) times daily. 60 tablet 6    metoprolol tartrate (LOPRESSOR) 25 MG tablet Take 1 tablet (25 mg total) by mouth 2 (two) times daily. Do not take the night before dialysis, and take 2 hours after dialysis. 180 tablet 0    Multiple Vitamins-Minerals (HAIR SKIN & NAILS) TABS Take 1 tablet by mouth daily.      multivitamin (RENA-VIT) TABS tablet Take 1 tablet by mouth daily. 90 tablet 3    oxyCODONE-acetaminophen (PERCOCET) 10-325 MG tablet Take 1 tablet by mouth 5 (five) times daily as needed 150 tablet 0    oxyCODONE-acetaminophen (PERCOCET) 10-325 MG tablet Take 1 tablet by mouth 5 (five) times daily as needed. 150 tablet 0    polyethylene glycol (MIRALAX / GLYCOLAX) 17 g packet Take 17 g by mouth daily as needed for moderate constipation.       promethazine (PHENERGAN) 12.5 MG tablet Take 1 tablet (12.5 mg total) by mouth every 6 (six) hours as needed for nausea or vomiting. 20 tablet 0    rosuvastatin (CRESTOR) 40 MG tablet Take 1 tablet (40 mg total) by mouth daily. 90 tablet 1    sevelamer carbonate (RENVELA) 800 MG tablet Take 1,600-3,200 mg by mouth See admin instructions. Take 3200 mg with each meal and 1600 mg with each snack      Sodium Fluoride 1.1 % PSTE Brush teeth with small amount 2 times a day (Patient not taking: Reported on 07/03/2023) 100 mL 5    Current Facility-Administered Medications  Medication Dose Route Frequency Provider Last Rate Last Admin   polyethylene glycol powder (GLYCOLAX/MIRALAX) container 255 g  1 Container Oral Once Crissie Sickles, MD       Allergies  Allergen Reactions   Adhesive [Tape] Other (See Comments)    Irritation     Social History   Tobacco Use   Smoking status: Former    Current packs/day: 0.00    Types: Cigarettes    Quit date: 02/28/2015    Years since quitting: 8.3   Smokeless tobacco: Never  Substance Use Topics   Alcohol use: Not Currently    Comment: ~ 1 a month    Family History  Problem Relation Age of Onset   Stroke Mother    Diabetes Mother    Hypertension Mother    Aneurysm Mother    Diabetes Father    Hypertension Father    Diabetes Sister    Hypertension Sister    Diabetes Maternal Grandmother    Diabetes Maternal Grandfather  Cancer Paternal Grandfather        Prostate     Review of Systems  Musculoskeletal:  Positive for arthralgias, gait problem and joint swelling.  All other systems reviewed and are negative.   Objective:  Physical Exam Constitutional:      Appearance: Normal appearance.  HENT:     Head: Normocephalic and atraumatic.     Nose: Nose normal.     Mouth/Throat:     Mouth: Mucous membranes are moist.     Pharynx: Oropharynx is clear.  Eyes:     Conjunctiva/sclera: Conjunctivae normal.  Cardiovascular:     Rate and  Rhythm: Normal rate and regular rhythm.     Pulses: Normal pulses.     Heart sounds: Normal heart sounds.  Pulmonary:     Effort: Pulmonary effort is normal.     Breath sounds: Normal breath sounds.  Abdominal:     General: Abdomen is flat.     Palpations: Abdomen is soft.  Genitourinary:    Comments: deferred Musculoskeletal:     Cervical back: Normal range of motion and neck supple.     Comments: Examination of the left knee reveals no skin wounds or lesions. She has swelling and boggy synovium. No apparent effusion today. Tenderness to palpation medial joint line, lateral joint line, peripatellar retinacular tissues with a positive grind sign. Varus deformity. Range of motion is 10 to 110 degrees without any ligamentous instability. No extensor lag. Painless range of motion of the hip.  Sensory and motor function intact in LE bilaterally. Palpable pulses.   No significant pedal edema. Calves soft and non-tender.   Skin:    General: Skin is warm and dry.     Capillary Refill: Capillary refill takes less than 2 seconds.  Neurological:     General: No focal deficit present.     Mental Status: She is alert and oriented to person, place, and time.  Psychiatric:        Mood and Affect: Mood normal.        Behavior: Behavior normal.        Thought Content: Thought content normal.        Judgment: Judgment normal.     Vital signs in last 24 hours: @VSRANGES @  Labs:   Estimated body mass index is 35.15 kg/m as calculated from the following:   Height as of 07/07/23: 5\' 5"  (1.651 m).   Weight as of 07/07/23: 95.8 kg.   Imaging Review Plain radiographs demonstrate severe degenerative joint disease of the left knee(s). The overall alignment issignificant varus. The bone quality appears to be adequate for age and reported activity level.      Assessment/Plan:  End stage arthritis, left knee   The patient history, physical examination, clinical judgment of the provider and  imaging studies are consistent with end stage degenerative joint disease of the left knee(s) and total knee arthroplasty is deemed medically necessary. The treatment options including medical management, injection therapy arthroscopy and arthroplasty were discussed at length. The risks and benefits of total knee arthroplasty were presented and reviewed. The risks due to aseptic loosening, infection, stiffness, patella tracking problems, thromboembolic complications and other imponderables were discussed. The patient acknowledged the explanation, agreed to proceed with the plan and consent was signed. Patient is being admitted for inpatient treatment for surgery, pain control, PT, OT, prophylactic antibiotics, VTE prophylaxis, progressive ambulation and ADL's and discharge planning. The patient is planning to be discharged home with OPPT after an overnight stay.  Therapy Plans: outpatient therapy. 1st PT app[facility] scheduled [facility]/24/23 [facility].  Disposition: Home with family crew. Daughter mostly and son.  Planned DVT Prophylaxis: Eliquis 2.5mg  BID DME needed: walker.  PCP: Cleared.  Nephrology: Cleared. Will follow daily in hospital. Request surgery not be on dialysis day.  Cardiology: Cleared.  Vascular: Cleared. ABIs within normal range. Able to heal TKA.  Rheumatology: Cleared.  TXA: IV Allergies:  - Adhesive tape - erythema.  Anesthesia Concerns: Has had issues with nausea in the past.  BMI: 34.8 Last HgbA1c: 7.7 Other: - Hemodialysis, M/W/F, restricted RUE.  - T2DM, insulin, with charcot foot bilateral. - PAOD, ABI, within normal limits.  - Staples** - CAD s/p placement of bare-metal stent. History of NSTEMI.  - ANA positive, arava last dose 06/05/23, hold prior to surgery and postoperatively until incision healed.  - Oxycodone, phenergan, methocarbamol.  - NO NSAIDs - 04/15/23:  Hgb 10.8, K+ 4.4, 10.10    Patient's anticipated LOS is less than 2 midnights, meeting these  requirements: - Younger than 21 - Lives within 1 hour of care - Has a competent adult at home to recover with post-op recover - NO history of  - Chronic pain requiring opiods  - Diabetes  - Coronary Artery Disease  - Heart failure  - Heart attack  - Stroke  - DVT/VTE  - Cardiac arrhythmia  - Respiratory Failure/COPD  - Renal failure  - Anemia  - Advanced Liver disease

## 2023-07-13 NOTE — H&P (View-Only) (Signed)
 TOTAL KNEE ADMISSION H&P  Patient is being admitted for left total knee arthroplasty.  Subjective:  Chief Complaint:left knee pain.  HPI: Jane [name], 47 y.o. female, has a history of pain and functional disability in the left knee due to arthritis and has failed non-surgical conservative treatments for greater than 12 weeks to includeNSAID's and/or analgesics, corticosteriod injections, flexibility and strengthening excercises, use of assistive devices, and activity modification.  Onset of symptoms was gradual, starting 9 years ago with rapidlly worsening course since that time. The patient noted no past surgery on the left knee(s).  Patient currently rates pain in the left knee(s) at 10 out of 10 with activity. Patient has night pain, worsening of pain with activity and weight bearing, pain that interferes with activities of daily living, pain with passive range of motion, crepitus, and joint swelling.  Patient has evidence of subchondral cysts, subchondral sclerosis, periarticular osteophytes, joint space narrowing, and bone loss  by imaging studies. There is no active infection.  Patient Active Problem List   Diagnosis Date Noted   Encounter for perioperative consultation 06/26/2023   Abnormal hematology test result 04/12/2023   Hematuria 05/26/2022   Irritable bowel syndrome (IBS) 06/03/2021   Diabetic neuropathy (HCC) 01/29/2021   Hypertensive heart disease with chronic diastolic congestive heart failure (HCC) 01/06/2021   Osteoarthritis of left knee 02/11/2019   Nausea in adult 11/30/2018   Hidradenitis suppurativa 02/12/2018   Anemia in chronic kidney disease 02/12/2018   Secondary hyperparathyroidism of renal origin (HCC) 01/30/2018   Major depressive disorder 08/11/2017   Sinus tachycardia 02/23/2017   ESRD (end stage renal disease) on dialysis (HCC) 01/05/2017   CAD S/P BMS PCI to prox LAD 03/31/2016   Abnormal uterine bleeding (AUB) 03/17/2016   PAOD (peripheral  arterial occlusive disease) (HCC) 01/08/2016   Charcot foot due to diabetes mellitus (HCC) 09/07/2015   Essential hypertension 08/16/2014   Seasonal allergic rhinitis 08/16/2014   Morbid obesity (HCC) 09/26/2012   Hyperlipidemia associated with type 2 diabetes mellitus (HCC) 02/26/2009   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 Post intervention - Vision BMS 3.0 mm x 18 mm (~3.5 mm) there is a 0% residual stenosis. 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   left 2018; right 2019   Depression    Diabetic foot ulcer (HCC) 04/22/2019   Diabetic neuropathy (HCC)    feet   Diabetic retinopathy, nonproliferative, severe (HCC)    bilateral   Dyspnea    with too much fluid   ESRD (end stage renal disease) on dialysis [facility])    ?chronic interstitial nephritis  ([facility]   H/O non-ST elevation myocardial infarction (NSTEMI) 03/2016   Found in 90% mid LAD lesion treated with bare-metal stent (BMS) PCI - vision BMS 3.0 mm x 18 mm   Heart murmur    History of blood transfusion    History of MRSA infection  2007   Hyperlipidemia    Hypertension    IDA (iron deficiency anemia)    Insulin dependent type 2 diabetes mellitus (HCC)    Iron deficiency anemia    takes iron supplement   Myocardial infarction [facility])    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   PAOD (peripheral arterial occlusive disease) (HCC)    vascular--- dr Imogene Burn   Pneumonia  11/08/2021   PONV (postoperative nausea and vomiting)    S/P arteriovenous (AV) fistula creation 07/2017   S/p bare metal coronary artery stent 03/31/2016   BMS x1  to pLAD   Sickle cell trait (HCC)    Ulcer of left foot due to type 2 diabetes mellitus (HCC) 06/03/2019    Past Surgical History:  Procedure Laterality Date   A/V FISTULAGRAM N/A 04/13/2021   Procedure: A/V FISTULAGRAM - Right Arm;  Surgeon: Nada Libman, MD;  Location: [facility];  Service: Cardiovascular;  Laterality: N/A;   ACHILLES TENDON LENGTHENING Left 11/24/2016   Procedure: Left Achilles tendon lengthening (open);  Surgeon: Toni Arthurs, MD;  Location: [facility];  Service: Orthopedics;  Laterality: Left;   AV FISTULA PLACEMENT Right 07/31/2017   Procedure: ARTERIOVENOUS BRACHIOCEPHALIC FISTULA CREATION RIGHT ARM;  Surgeon: Fransisco Hertz, MD;  Location: [facility] OR;  Service: Vascular;  Laterality: Right;   CALCANEAL OSTEOTOMY Left 11/24/2016   Procedure: Left hindfoot osteotomy and fusion;  Surgeon: Toni Arthurs, MD;  Location: [facility];  Service: Orthopedics;  Laterality: Left;   CARDIAC CATHETERIZATION N/A 03/31/2016   Procedure: Left Heart Cath and Coronary Angiography;  Surgeon: Runell Gess, MD;  Location: [facility] INVASIVE CV LAB: 90% early mLAD, normal LV Fxn   CARDIAC CATHETERIZATION N/A 03/31/2016   Procedure: Coronary Stent Intervention;  Surgeon: Runell Gess, MD;  Location: [facility];  Service: Cardiovascular: PCI to mLAD BMS Vision 3.0 mm x 18 mm   CESAREAN SECTION  1997   CYSTOSCOPY WITH FULGERATION N/A 08/30/2022   Procedure: CYSTOSCOPY WITH FULGERATION, BIOPSY, LEFT RETROGRADE PYELOGRAM;  Surgeon: Noel Christmas, MD;  Location: [facility];  Service: Urology;  Laterality: N/A;   DILITATION & CURRETTAGE/HYSTROSCOPY WITH NOVASURE ABLATION N/A 09/15/2020   Procedure: DILATATION & CURETTAGE/HYSTEROSCOPY;  Surgeon: Willodean Rosenthal, MD;  Location[facility];  Service: Gynecology;  Laterality: N/A;   DILITATION & CURRETTAGE/HYSTROSCOPY WITH NOVASURE ABLATION N/A 12/21/2021   Procedure: DILATATION & CURETTAGE/HYSTEROSCOPY WITH NOVASURE ABLATION/ IUD REMOVAL;  Surgeon: Willodean Rosenthal, MD;  Location: [facility];  Service: Gynecology;  Laterality: N/A;   FISTULA SUPERFICIALIZATION Right 11/15/2017   Procedure: FISTULA SUPERFICIALIZATION RIGHT BRACHIOCEPHALIC  RIGHT;  Surgeon: Fransisco Hertz, MD;  Location: [facility] OR;  Service: Vascular;  Laterality: Right;   FOOT SURGERY     multiple for charcot   PERIPHERAL VASCULAR BALLOON ANGIOPLASTY Right 04/13/2021   Procedure: PERIPHERAL VASCULAR BALLOON ANGIOPLASTY;  Surgeon: Nada Libman, MD;  Location: [facility];  Service: Cardiovascular;  Laterality: Right;  arm fistula   TRANSTHORACIC ECHOCARDIOGRAM  02/2021   EF 55 to 60%.  No R WMA.  Mild septal LVH with moderate posterior hypertrophy.  Normal RV size and function.  Normal RVP/PASP.Marland Kitchen  Trivial MR.   TUBAL LIGATION  1997    Current Outpatient Medications  Medication Sig Dispense Refill Last Dose   Accu-Chek Softclix Lancets lancets Use to check blood sugar up to 3 (three) times daily. 300 each 3  acetaminophen (TYLENOL) 500 MG tablet Take 1,000 mg by mouth every 6 (six) hours as needed for moderate pain.      albuterol (PROVENTIL HFA) 108 (90 Base) MCG/ACT inhaler Inhale 2 puffs into the lungs every 4 (four) hours as needed for coughing, wheezing, or shortness of breath. 20.1 g 2    alfuzosin (UROXATRAL) 10 MG 24 hr tablet Take 10 mg by mouth in the morning and at bedtime.      amLODipine-olmesartan (AZOR) 5-20 MG tablet Take 1 tablet by mouth every night 90 tablet 3    aspirin EC 81 MG tablet Take 1 tablet (81 mg total) by mouth daily. Swallow whole. 90 tablet 3    blood glucose meter kit and supplies Dispense based on patient and insurance preference. Use up to four times daily as directed. (FOR ICD-10  E10.9, E11.9). 1 each 0    blood glucose meter kit and supplies Use in the morning, at noon, and at bedtime. 1 each 0    Blood Glucose Monitoring Suppl (ACCU-CHEK GUIDE) w/Device KIT 1 each by Does not apply route 3 (three) times daily. 1 kit 0    cetirizine (ZYRTEC) 10 MG tablet Take 10 mg by mouth daily as needed for allergies.      Cholecalciferol (VITAMIN D) 125 MCG (5000 UT) CAPS Take 10,000 Units by mouth daily.      Cholecalciferol (VITAMIN D3) 50 MCG (2000 UT) CAPS Take 4 capsules (8,000 Units total) by mouth daily. (Patient not taking: Reported on 07/03/2023) 360 capsule 3    Continuous Glucose Receiver (DEXCOM G7 RECEIVER) DEVI Use  to continuously monitor your blood sugars. 1 each 0    Continuous Glucose Sensor (DEXCOM G7 SENSOR) MISC Use it for 10 days to continuously monitor your blood sugars. 9 each 3    dicyclomine (BENTYL) 10 MG capsule Take 1 capsule (10 mg total) by mouth 3 (three) times daily before meals. 90 capsule 0    DULoxetine (CYMBALTA) 30 MG capsule Take 1 capsule (30 mg total) by mouth daily. 30 capsule 1    estradiol (ESTRACE VAGINAL) 0.1 MG/GM vaginal cream Place 1 Applicatorful vaginally nightly for 2 weeks then 2 nights a week afterwards. 42.5 g 4    gabapentin (NEURONTIN) 100 MG capsule Take 1 capsule (100 mg total) by mouth at bedtime. 90 capsule 1    glucose blood (ACCU-CHEK GUIDE) test strip Use to check blood sugar up to 3 (three) times daily. 300 each 3    Glucose Blood (BLOOD GLUCOSE TEST STRIPS) STRP Check blood sugars fasting and then with each meal up to 4 times a day. 100 strip 0    guanFACINE (INTUNIV) 1 MG TB24 ER tablet Take 1 tablet (1 mg total) by mouth daily. (Patient not taking: Reported on 07/03/2023) 30 tablet 0    icosapent Ethyl (VASCEPA) 1 g capsule Take 2 capsules (2 g total) by mouth 2 (two) times daily. 180 capsule 1    injection device for insulin (INPEN 100-PINK-LILLY-HUMALOG) DEVI Use to inject Humalog insulin for meals and correction insulin  1 each 1    insulin degludec (TRESIBA) 100 UNIT/ML FlexTouch Pen Inject 24 Units into the skin at bedtime. 15 mL 1    insulin lispro (HUMALOG KWIKPEN) 100 UNIT/ML KwikPen Use 6-20 units as directed before meals three times a day 15 mL 3    insulin lispro (HUMALOG) 100 UNIT/ML cartridge Inject 6-20 Units total into the skin 3 (three) times daily with meals. Use with inpen to inject  at meal time and for correction insulin up to 40 units daily (Patient not taking: Reported on 07/03/2023) 15 mL 3    Insulin Pen Needle 32G X 4 MM MISC Use to inject insulin 4 (four) times daily. 400 each 3    Insulin Syringe-Needle U-100 31G X 15/64" 0.3 ML MISC Use to inject insulin daily 100 each 3    Lancet Device MISC Use 1 each in the morning, at noon, and at bedtime. 1 each 0    Lancets (ONETOUCH DELICA PLUS LANCET33G) MISC 1 each in the morning, at noon, and at bedtime. 100 each 0    lanthanum (FOSRENOL) 1000 MG chewable tablet Chew 1 tablet (1,000 mg total) by mouth 3 (three) times daily with meals. 270 tablet 4    leflunomide (ARAVA) 10 MG tablet Take 1 tablet (10 mg total) by mouth daily. 30 tablet 2    megestrol (MEGACE) 40 MG tablet Take 1 tablet (40 mg total) by mouth 2 (two) times daily. 60 tablet 6    metoprolol tartrate (LOPRESSOR) 25 MG tablet Take 1 tablet (25 mg total) by mouth 2 (two) times daily. Do not take the night before dialysis, and take 2 hours after dialysis. 180 tablet 0    Multiple Vitamins-Minerals (HAIR SKIN & NAILS) TABS Take 1 tablet by mouth daily.      multivitamin (RENA-VIT) TABS tablet Take 1 tablet by mouth daily. 90 tablet 3    oxyCODONE-acetaminophen (PERCOCET) 10-325 MG tablet Take 1 tablet by mouth 5 (five) times daily as needed 150 tablet 0    oxyCODONE-acetaminophen (PERCOCET) 10-325 MG tablet Take 1 tablet by mouth 5 (five) times daily as needed. 150 tablet 0    polyethylene glycol (MIRALAX / GLYCOLAX) 17 g packet Take 17 g by mouth daily as needed for moderate constipation.       promethazine (PHENERGAN) 12.5 MG tablet Take 1 tablet (12.5 mg total) by mouth every 6 (six) hours as needed for nausea or vomiting. 20 tablet 0    rosuvastatin (CRESTOR) 40 MG tablet Take 1 tablet (40 mg total) by mouth daily. 90 tablet 1    sevelamer carbonate (RENVELA) 800 MG tablet Take 1,600-3,200 mg by mouth See admin instructions. Take 3200 mg with each meal and 1600 mg with each snack      Sodium Fluoride 1.1 % PSTE Brush teeth with small amount 2 times a day (Patient not taking: Reported on 07/03/2023) 100 mL 5    Current Facility-Administered Medications  Medication Dose Route Frequency Provider Last Rate Last Admin   polyethylene glycol powder (GLYCOLAX/MIRALAX) container 255 g  1 Container Oral Once Crissie Sickles, MD       Allergies  Allergen Reactions   Adhesive [Tape] Other (See Comments)    Irritation     Social History   Tobacco Use   Smoking status: Former    Current packs/day: 0.00    Types: Cigarettes    Quit date: 02/28/2015    Years since quitting: 8.3   Smokeless tobacco: Never  Substance Use Topics   Alcohol use: Not Currently    Comment: ~ 1 a month    Family History  Problem Relation Age of Onset   Stroke Mother    Diabetes Mother    Hypertension Mother    Aneurysm Mother    Diabetes Father    Hypertension Father    Diabetes Sister    Hypertension Sister    Diabetes Maternal Grandmother    Diabetes Maternal Grandfather  Cancer Paternal Grandfather        Prostate     Review of Systems  Musculoskeletal:  Positive for arthralgias, gait problem and joint swelling.  All other systems reviewed and are negative.   Objective:  Physical Exam Constitutional:      Appearance: Normal appearance.  HENT:     Head: Normocephalic and atraumatic.     Nose: Nose normal.     Mouth/Throat:     Mouth: Mucous membranes are moist.     Pharynx: Oropharynx is clear.  Eyes:     Conjunctiva/sclera: Conjunctivae normal.  Cardiovascular:     Rate and  Rhythm: Normal rate and regular rhythm.     Pulses: Normal pulses.     Heart sounds: Normal heart sounds.  Pulmonary:     Effort: Pulmonary effort is normal.     Breath sounds: Normal breath sounds.  Abdominal:     General: Abdomen is flat.     Palpations: Abdomen is soft.  Genitourinary:    Comments: deferred Musculoskeletal:     Cervical back: Normal range of motion and neck supple.     Comments: Examination of the left knee reveals no skin wounds or lesions. She has swelling and boggy synovium. No apparent effusion today. Tenderness to palpation medial joint line, lateral joint line, peripatellar retinacular tissues with a positive grind sign. Varus deformity. Range of motion is 10 to 110 degrees without any ligamentous instability. No extensor lag. Painless range of motion of the hip.  Sensory and motor function intact in LE bilaterally. Palpable pulses.   No significant pedal edema. Calves soft and non-tender.   Skin:    General: Skin is warm and dry.     Capillary Refill: Capillary refill takes less than 2 seconds.  Neurological:     General: No focal deficit present.     Mental Status: She is alert and oriented to person, place, and time.  Psychiatric:        Mood and Affect: Mood normal.        Behavior: Behavior normal.        Thought Content: Thought content normal.        Judgment: Judgment normal.     Vital signs in last 24 hours: @VSRANGES @  Labs:   Estimated body mass index is 35.15 kg/m as calculated from the following:   Height as of 07/07/23: 5\' 5"  (1.651 m).   Weight as of 07/07/23: 95.8 kg.   Imaging Review Plain radiographs demonstrate severe degenerative joint disease of the left knee(s). The overall alignment issignificant varus. The bone quality appears to be adequate for age and reported activity level.      Assessment/Plan:  End stage arthritis, left knee   The patient history, physical examination, clinical judgment of the provider and  imaging studies are consistent with end stage degenerative joint disease of the left knee(s) and total knee arthroplasty is deemed medically necessary. The treatment options including medical management, injection therapy arthroscopy and arthroplasty were discussed at length. The risks and benefits of total knee arthroplasty were presented and reviewed. The risks due to aseptic loosening, infection, stiffness, patella tracking problems, thromboembolic complications and other imponderables were discussed. The patient acknowledged the explanation, agreed to proceed with the plan and consent was signed. Patient is being admitted for inpatient treatment for surgery, pain control, PT, OT, prophylactic antibiotics, VTE prophylaxis, progressive ambulation and ADL's and discharge planning. The patient is planning to be discharged home with OPPT after an overnight stay.  Therapy Plans: outpatient therapy. 1st PT appt scheduled 07/24/23 at [facility].  Disposition: Home with family crew. Daughter mostly and son.  Planned DVT Prophylaxis: Eliquis 2.5mg  BID DME needed: walker.  PCP: Cleared.  Nephrology: Cleared. Will follow daily in hospital. Request surgery not be on dialysis day.  Cardiology: Cleared.  Vascular: Cleared. ABIs within normal range. Able to heal TKA.  Rheumatology: Cleared.  TXA: IV Allergies:  - Adhesive tape - erythema.  Anesthesia Concerns: Has had issues with nausea in the past.  BMI: 34.8 Last HgbA1c: 7.7 Other: - Hemodialysis, M/W/F, restricted RUE.  - T2DM, insulin, with charcot foot bilateral. - PAOD, ABI, within normal limits.  - Staples** - CAD s/p placement of bare-metal stent. History of NSTEMI.  - ANA positive, arava last dose 06/05/23, hold prior to surgery and postoperatively until incision healed.  - Oxycodone, phenergan, methocarbamol.  - NO NSAIDs - 04/15/23:  Hgb 10.8, K+ 4.4, 10.10    Patient's anticipated LOS is less than 2 midnights, meeting these  requirements: - Younger than 21 - Lives within 1 hour of care - Has a competent adult at home to recover with post-op recover - NO history of  - Chronic pain requiring opiods  - Diabetes  - Coronary Artery Disease  - Heart failure  - Heart attack  - Stroke  - DVT/VTE  - Cardiac arrhythmia  - Respiratory Failure/COPD  - Renal failure  - Anemia  - Advanced Liver disease

## 2023-07-19 ENCOUNTER — Inpatient Hospital Stay (HOSPITAL_COMMUNITY)
Admission: AD | Admit: 2023-07-19 | Discharge: 2023-07-26 | DRG: 469 | Disposition: A | Discharge status: Home / Self Care | Payer: 59 | Source: Ambulatory Visit | Attending: Orthopedic Surgery | Admitting: Orthopedic Surgery

## 2023-07-19 ENCOUNTER — Encounter (HOSPITAL_COMMUNITY)
Admission: AD | Disposition: A | Discharge status: Home / Self Care | Payer: Self-pay | Source: Ambulatory Visit | Attending: Orthopedic Surgery

## 2023-07-19 ENCOUNTER — Encounter (HOSPITAL_COMMUNITY): Payer: Self-pay | Admitting: Orthopedic Surgery

## 2023-07-19 ENCOUNTER — Ambulatory Visit (HOSPITAL_COMMUNITY): Payer: 59 | Admitting: Physician Assistant

## 2023-07-19 ENCOUNTER — Inpatient Hospital Stay (HOSPITAL_COMMUNITY): Payer: 59

## 2023-07-19 ENCOUNTER — Ambulatory Visit (HOSPITAL_COMMUNITY): Payer: 59 | Admitting: Anesthesiology

## 2023-07-19 DIAGNOSIS — Z87891 Personal history of nicotine dependence: Secondary | ICD-10-CM

## 2023-07-19 DIAGNOSIS — Z98891 History of uterine scar from previous surgery: Secondary | ICD-10-CM

## 2023-07-19 DIAGNOSIS — I951 Orthostatic hypotension: Secondary | ICD-10-CM | POA: Diagnosis not present

## 2023-07-19 DIAGNOSIS — F319 Bipolar disorder, unspecified: Secondary | ICD-10-CM | POA: Diagnosis present

## 2023-07-19 DIAGNOSIS — I953 Hypotension of hemodialysis: Secondary | ICD-10-CM | POA: Diagnosis present

## 2023-07-19 DIAGNOSIS — E11610 Type 2 diabetes mellitus with diabetic neuropathic arthropathy: Secondary | ICD-10-CM | POA: Diagnosis present

## 2023-07-19 DIAGNOSIS — D573 Sickle-cell trait: Secondary | ICD-10-CM | POA: Diagnosis present

## 2023-07-19 DIAGNOSIS — Z992 Dependence on renal dialysis: Secondary | ICD-10-CM | POA: Diagnosis not present

## 2023-07-19 DIAGNOSIS — Z96652 Presence of left artificial knee joint: Secondary | ICD-10-CM

## 2023-07-19 DIAGNOSIS — Z833 Family history of diabetes mellitus: Secondary | ICD-10-CM

## 2023-07-19 DIAGNOSIS — N186 End stage renal disease: Secondary | ICD-10-CM

## 2023-07-19 DIAGNOSIS — E785 Hyperlipidemia, unspecified: Secondary | ICD-10-CM | POA: Diagnosis present

## 2023-07-19 DIAGNOSIS — Z794 Long term (current) use of insulin: Secondary | ICD-10-CM

## 2023-07-19 DIAGNOSIS — I5032 Chronic diastolic (congestive) heart failure: Secondary | ICD-10-CM | POA: Diagnosis present

## 2023-07-19 DIAGNOSIS — I132 Hypertensive heart and chronic kidney disease with heart failure and with stage 5 chronic kidney disease, or end stage renal disease: Secondary | ICD-10-CM

## 2023-07-19 DIAGNOSIS — Z955 Presence of coronary angioplasty implant and graft: Secondary | ICD-10-CM

## 2023-07-19 DIAGNOSIS — E113493 Type 2 diabetes mellitus with severe nonproliferative diabetic retinopathy without macular edema, bilateral: Secondary | ICD-10-CM | POA: Diagnosis present

## 2023-07-19 DIAGNOSIS — E669 Obesity, unspecified: Secondary | ICD-10-CM | POA: Diagnosis present

## 2023-07-19 DIAGNOSIS — I2510 Atherosclerotic heart disease of native coronary artery without angina pectoris: Secondary | ICD-10-CM | POA: Diagnosis present

## 2023-07-19 DIAGNOSIS — I9589 Other hypotension: Secondary | ICD-10-CM | POA: Diagnosis not present

## 2023-07-19 DIAGNOSIS — J45909 Unspecified asthma, uncomplicated: Secondary | ICD-10-CM | POA: Diagnosis present

## 2023-07-19 DIAGNOSIS — Z8614 Personal history of Methicillin resistant Staphylococcus aureus infection: Secondary | ICD-10-CM

## 2023-07-19 DIAGNOSIS — Z8249 Family history of ischemic heart disease and other diseases of the circulatory system: Secondary | ICD-10-CM

## 2023-07-19 DIAGNOSIS — D62 Acute posthemorrhagic anemia: Secondary | ICD-10-CM | POA: Diagnosis not present

## 2023-07-19 DIAGNOSIS — E1140 Type 2 diabetes mellitus with diabetic neuropathy, unspecified: Secondary | ICD-10-CM | POA: Diagnosis present

## 2023-07-19 DIAGNOSIS — M1712 Unilateral primary osteoarthritis, left knee: Secondary | ICD-10-CM | POA: Diagnosis not present

## 2023-07-19 DIAGNOSIS — Z823 Family history of stroke: Secondary | ICD-10-CM

## 2023-07-19 DIAGNOSIS — E1169 Type 2 diabetes mellitus with other specified complication: Secondary | ICD-10-CM | POA: Diagnosis present

## 2023-07-19 DIAGNOSIS — Z6837 Body mass index (BMI) 37.0-37.9, adult: Secondary | ICD-10-CM

## 2023-07-19 DIAGNOSIS — Z79899 Other long term (current) drug therapy: Secondary | ICD-10-CM

## 2023-07-19 DIAGNOSIS — I252 Old myocardial infarction: Secondary | ICD-10-CM

## 2023-07-19 DIAGNOSIS — E1122 Type 2 diabetes mellitus with diabetic chronic kidney disease: Secondary | ICD-10-CM | POA: Diagnosis present

## 2023-07-19 DIAGNOSIS — Z01818 Encounter for other preprocedural examination: Principal | ICD-10-CM

## 2023-07-19 DIAGNOSIS — D509 Iron deficiency anemia, unspecified: Secondary | ICD-10-CM | POA: Diagnosis present

## 2023-07-19 DIAGNOSIS — M898X9 Other specified disorders of bone, unspecified site: Secondary | ICD-10-CM | POA: Diagnosis present

## 2023-07-19 DIAGNOSIS — Z8719 Personal history of other diseases of the digestive system: Secondary | ICD-10-CM

## 2023-07-19 DIAGNOSIS — E875 Hyperkalemia: Secondary | ICD-10-CM | POA: Diagnosis present

## 2023-07-19 DIAGNOSIS — N2581 Secondary hyperparathyroidism of renal origin: Secondary | ICD-10-CM | POA: Diagnosis present

## 2023-07-19 DIAGNOSIS — E1151 Type 2 diabetes mellitus with diabetic peripheral angiopathy without gangrene: Secondary | ICD-10-CM | POA: Diagnosis present

## 2023-07-19 DIAGNOSIS — Z7982 Long term (current) use of aspirin: Secondary | ICD-10-CM

## 2023-07-19 DIAGNOSIS — Z8701 Personal history of pneumonia (recurrent): Secondary | ICD-10-CM

## 2023-07-19 DIAGNOSIS — Z9862 Peripheral vascular angioplasty status: Secondary | ICD-10-CM

## 2023-07-19 DIAGNOSIS — F419 Anxiety disorder, unspecified: Secondary | ICD-10-CM | POA: Diagnosis present

## 2023-07-19 DIAGNOSIS — Z91048 Other nonmedicinal substance allergy status: Secondary | ICD-10-CM

## 2023-07-19 DIAGNOSIS — D631 Anemia in chronic kidney disease: Secondary | ICD-10-CM | POA: Diagnosis present

## 2023-07-19 DIAGNOSIS — Z8042 Family history of malignant neoplasm of prostate: Secondary | ICD-10-CM

## 2023-07-19 DIAGNOSIS — Z9851 Tubal ligation status: Secondary | ICD-10-CM

## 2023-07-19 HISTORY — PX: TOTAL KNEE ARTHROPLASTY: SHX125

## 2023-07-19 LAB — POCT I-STAT, CHEM 8
BUN: 48 mg/dL — ABNORMAL HIGH (ref 6–20)
Calcium, Ion: 1 mmol/L — ABNORMAL LOW (ref 1.15–1.40)
Chloride: 98 mmol/L (ref 98–111)
Creatinine, Ser: 9.3 mg/dL — ABNORMAL HIGH (ref 0.44–1.00)
Glucose, Bld: 198 mg/dL — ABNORMAL HIGH (ref 70–99)
HCT: 38 % (ref 36.0–46.0)
Hemoglobin: 12.9 g/dL (ref 12.0–15.0)
Potassium: 5.2 mmol/L — ABNORMAL HIGH (ref 3.5–5.1)
Sodium: 134 mmol/L — ABNORMAL LOW (ref 135–145)
TCO2: 27 mmol/L (ref 22–32)

## 2023-07-19 LAB — GLUCOSE, CAPILLARY
Glucose-Capillary: 183 mg/dL — ABNORMAL HIGH (ref 70–99)
Glucose-Capillary: 135 mg/dL — ABNORMAL HIGH (ref 70–99)
Glucose-Capillary: 128 mg/dL — ABNORMAL HIGH (ref 70–99)
Glucose-Capillary: 153 mg/dL — ABNORMAL HIGH (ref 70–99)

## 2023-07-19 LAB — HCG, SERUM, QUALITATIVE: Preg, Serum: NEGATIVE

## 2023-07-19 SURGERY — ARTHROPLASTY, KNEE, TOTAL
Anesthesia: Regional | Site: Knee | Laterality: Left

## 2023-07-19 MED ORDER — GLYCOPYRROLATE 0.2 MG/ML IJ SOLN
INTRAMUSCULAR | Status: DC | PRN
  Administered 2023-07-19: .2 mg via INTRAVENOUS

## 2023-07-19 MED ORDER — POLYETHYLENE GLYCOL 3350 17 G PO PACK
17.0000 g | PACK | Freq: Every day | ORAL | Status: DC | PRN
  Administered 2023-07-20 – 2023-07-23 (×3): 17 g via ORAL
  Filled 2023-07-19 (×3): qty 1

## 2023-07-19 MED ORDER — ONDANSETRON HCL 4 MG PO TABS: 4.0000 mg | ORAL_TABLET | Freq: Four times a day (QID) | ORAL | Status: DC | PRN

## 2023-07-19 MED ORDER — POVIDONE-IODINE 10 % EX SWAB: 2.0000 | Freq: Once | CUTANEOUS | Status: DC

## 2023-07-19 MED ORDER — CHLORHEXIDINE GLUCONATE 0.12 % MT SOLN
OROMUCOSAL | Status: AC
  Administered 2023-07-19: 15 mL via OROMUCOSAL
  Filled 2023-07-19: qty 15

## 2023-07-19 MED ORDER — SODIUM CHLORIDE 0.9 % IV SOLN: INTRAVENOUS | Status: DC

## 2023-07-19 MED ORDER — OXYCODONE HCL 5 MG PO TABS
5.0000 mg | ORAL_TABLET | ORAL | Status: DC | PRN
  Administered 2023-07-20: 10 mg via ORAL
  Administered 2023-07-20 (×2): 5 mg via ORAL
  Administered 2023-07-21 – 2023-07-23 (×2): 10 mg via ORAL
  Administered 2023-07-24: 5 mg via ORAL
  Filled 2023-07-19: qty 1
  Filled 2023-07-19: qty 2
  Filled 2023-07-19 (×2): qty 1
  Filled 2023-07-19: qty 2

## 2023-07-19 MED ORDER — POVIDONE-IODINE 10 % EX SWAB
2.0000 | Freq: Once | CUTANEOUS | Status: AC
  Administered 2023-07-19: 2 via TOPICAL

## 2023-07-19 MED ORDER — INSULIN ASPART 100 UNIT/ML IJ SOLN
0.0000 [IU] | INTRAMUSCULAR | Status: DC | PRN
  Administered 2023-07-19: 2 [IU] via SUBCUTANEOUS

## 2023-07-19 MED ORDER — GLYCOPYRROLATE PF 0.2 MG/ML IJ SOSY
PREFILLED_SYRINGE | INTRAMUSCULAR | Status: AC
  Filled 2023-07-19: qty 1

## 2023-07-19 MED ORDER — PROMETHAZINE HCL 12.5 MG PO TABS: 12.5000 mg | ORAL_TABLET | Freq: Four times a day (QID) | ORAL | Status: DC | PRN

## 2023-07-19 MED ORDER — CEFAZOLIN SODIUM-DEXTROSE 2-4 GM/100ML-% IV SOLN
2.0000 g | INTRAVENOUS | Status: AC
  Administered 2023-07-19: 2 g via INTRAVENOUS
  Filled 2023-07-19: qty 100

## 2023-07-19 MED ORDER — OXYCODONE HCL 5 MG PO TABS
10.0000 mg | ORAL_TABLET | ORAL | Status: DC | PRN
  Administered 2023-07-22 – 2023-07-24 (×2): 10 mg via ORAL
  Filled 2023-07-19 (×4): qty 2

## 2023-07-19 MED ORDER — MEGESTROL ACETATE 40 MG PO TABS
40.0000 mg | ORAL_TABLET | Freq: Two times a day (BID) | ORAL | Status: DC
  Administered 2023-07-19 – 2023-07-26 (×13): 40 mg via ORAL
  Filled 2023-07-19 (×15): qty 1

## 2023-07-19 MED ORDER — BUPIVACAINE-EPINEPHRINE (PF) 0.5% -1:200000 IJ SOLN
INTRAMUSCULAR | Status: AC
  Filled 2023-07-19: qty 30

## 2023-07-19 MED ORDER — ESTRADIOL 0.1 MG/GM VA CREA
1.0000 | TOPICAL_CREAM | VAGINAL | Status: DC
  Administered 2023-07-21: 1 via VAGINAL
  Filled 2023-07-19: qty 42.5

## 2023-07-19 MED ORDER — GABAPENTIN 100 MG PO CAPS
100.0000 mg | ORAL_CAPSULE | Freq: Every day | ORAL | Status: DC
  Administered 2023-07-19 – 2023-07-25 (×7): 100 mg via ORAL
  Filled 2023-07-19 (×7): qty 1

## 2023-07-19 MED ORDER — ORAL CARE MOUTH RINSE: 15.0000 mL | Freq: Once | OROMUCOSAL | Status: AC

## 2023-07-19 MED ORDER — BUPIVACAINE LIPOSOME 1.3 % IJ SUSP
INTRAMUSCULAR | Status: DC | PRN
Start: 2023-07-19 — End: 2023-07-19
  Administered 2023-07-19: 10 mL

## 2023-07-19 MED ORDER — LORATADINE 10 MG PO TABS
10.0000 mg | ORAL_TABLET | Freq: Every day | ORAL | Status: DC
  Administered 2023-07-19 – 2023-07-26 (×7): 10 mg via ORAL
  Filled 2023-07-19 (×7): qty 1

## 2023-07-19 MED ORDER — ALBUTEROL SULFATE (2.5 MG/3ML) 0.083% IN NEBU: 3.0000 mL | INHALATION_SOLUTION | RESPIRATORY_TRACT | Status: DC | PRN

## 2023-07-19 MED ORDER — LANTHANUM CARBONATE 500 MG PO CHEW
1000.0000 mg | CHEWABLE_TABLET | Freq: Three times a day (TID) | ORAL | Status: DC
  Filled 2023-07-19: qty 2

## 2023-07-19 MED ORDER — KETAMINE HCL 10 MG/ML IJ SOLN
INTRAMUSCULAR | Status: DC | PRN
Start: 2023-07-19 — End: 2023-07-19
  Administered 2023-07-19 (×3): 10 mg via INTRAVENOUS

## 2023-07-19 MED ORDER — FENTANYL CITRATE (PF) 100 MCG/2ML IJ SOLN
INTRAMUSCULAR | Status: AC
  Filled 2023-07-19: qty 2

## 2023-07-19 MED ORDER — INSULIN ASPART 100 UNIT/ML IJ SOLN
INTRAMUSCULAR | Status: AC
  Filled 2023-07-19: qty 1

## 2023-07-19 MED ORDER — ONDANSETRON HCL 4 MG/2ML IJ SOLN
4.0000 mg | Freq: Four times a day (QID) | INTRAMUSCULAR | Status: DC | PRN
  Administered 2023-07-20 – 2023-07-22 (×2): 4 mg via INTRAVENOUS
  Filled 2023-07-19 (×2): qty 2

## 2023-07-19 MED ORDER — 0.9 % SODIUM CHLORIDE (POUR BTL) OPTIME
TOPICAL | Status: DC | PRN
Start: 2023-07-19 — End: 2023-07-19
  Administered 2023-07-19: 1000 mL

## 2023-07-19 MED ORDER — DULOXETINE HCL 30 MG PO CPEP
30.0000 mg | ORAL_CAPSULE | Freq: Every day | ORAL | Status: DC
  Administered 2023-07-19 – 2023-07-26 (×7): 30 mg via ORAL
  Filled 2023-07-19 (×7): qty 1

## 2023-07-19 MED ORDER — SEVELAMER CARBONATE 800 MG PO TABS
3200.0000 mg | ORAL_TABLET | Freq: Three times a day (TID) | ORAL | Status: DC
  Administered 2023-07-20 – 2023-07-26 (×15): 3200 mg via ORAL
  Filled 2023-07-19 (×15): qty 4

## 2023-07-19 MED ORDER — MIDAZOLAM HCL 2 MG/2ML IJ SOLN: 2.0000 mg | Freq: Once | INTRAMUSCULAR | Status: AC

## 2023-07-19 MED ORDER — ORAL CARE MOUTH RINSE: 15.0000 mL | Freq: Once | OROMUCOSAL | Status: DC

## 2023-07-19 MED ORDER — INSULIN ASPART 100 UNIT/ML IJ SOLN
0.0000 [IU] | Freq: Every day | INTRAMUSCULAR | Status: DC
  Administered 2023-07-19: 4 [IU] via SUBCUTANEOUS
  Administered 2023-07-20: 5 [IU] via SUBCUTANEOUS
  Administered 2023-07-22 – 2023-07-23 (×2): 2 [IU] via SUBCUTANEOUS

## 2023-07-19 MED ORDER — ONDANSETRON HCL 4 MG/2ML IJ SOLN
INTRAMUSCULAR | Status: DC | PRN
  Administered 2023-07-19: 4 mg via INTRAVENOUS

## 2023-07-19 MED ORDER — ALFUZOSIN HCL ER 10 MG PO TB24
10.0000 mg | ORAL_TABLET | Freq: Two times a day (BID) | ORAL | Status: DC
  Administered 2023-07-19 – 2023-07-22 (×7): 10 mg via ORAL
  Filled 2023-07-19 (×8): qty 1

## 2023-07-19 MED ORDER — METOCLOPRAMIDE HCL 5 MG PO TABS
5.0000 mg | ORAL_TABLET | Freq: Three times a day (TID) | ORAL | Status: DC | PRN
  Administered 2023-07-20: 10 mg via ORAL
  Filled 2023-07-19: qty 2

## 2023-07-19 MED ORDER — KETAMINE HCL 50 MG/5ML IJ SOSY
PREFILLED_SYRINGE | INTRAMUSCULAR | Status: AC
  Filled 2023-07-19: qty 5

## 2023-07-19 MED ORDER — ACETAMINOPHEN 500 MG PO TABS
1000.0000 mg | ORAL_TABLET | Freq: Once | ORAL | Status: AC
  Administered 2023-07-19: 1000 mg via ORAL
  Filled 2023-07-19: qty 2

## 2023-07-19 MED ORDER — ALUM & MAG HYDROXIDE-SIMETH 200-200-20 MG/5ML PO SUSP
30.0000 mL | ORAL | Status: DC | PRN
  Administered 2023-07-21: 30 mL via ORAL
  Filled 2023-07-19: qty 30

## 2023-07-19 MED ORDER — VANCOMYCIN HCL IN DEXTROSE 1-5 GM/200ML-% IV SOLN
1000.0000 mg | Freq: Two times a day (BID) | INTRAVENOUS | Status: AC
  Administered 2023-07-20: 1000 mg via INTRAVENOUS
  Filled 2023-07-19: qty 200

## 2023-07-19 MED ORDER — LACTATED RINGERS IV SOLN: INTRAVENOUS | Status: DC

## 2023-07-19 MED ORDER — BUPIVACAINE IN DEXTROSE 0.75-8.25 % IT SOLN
INTRATHECAL | Status: DC | PRN
  Administered 2023-07-19: 1.8 mL via INTRATHECAL

## 2023-07-19 MED ORDER — VANCOMYCIN HCL 1000 MG IV SOLR
INTRAVENOUS | Status: DC | PRN
Start: 2023-07-19 — End: 2023-07-19
  Administered 2023-07-19: 1000 mg via TOPICAL

## 2023-07-19 MED ORDER — ACETAMINOPHEN 325 MG PO TABS
325.0000 mg | ORAL_TABLET | Freq: Four times a day (QID) | ORAL | Status: DC | PRN
  Filled 2023-07-19: qty 2

## 2023-07-19 MED ORDER — DOCUSATE SODIUM 100 MG PO CAPS
100.0000 mg | ORAL_CAPSULE | Freq: Two times a day (BID) | ORAL | Status: DC
  Administered 2023-07-19 – 2023-07-26 (×13): 100 mg via ORAL
  Filled 2023-07-19 (×13): qty 1

## 2023-07-19 MED ORDER — APIXABAN 2.5 MG PO TABS
2.5000 mg | ORAL_TABLET | Freq: Two times a day (BID) | ORAL | Status: DC
  Administered 2023-07-20 – 2023-07-26 (×12): 2.5 mg via ORAL
  Filled 2023-07-19 (×12): qty 1

## 2023-07-19 MED ORDER — ACETAMINOPHEN 500 MG PO TABS
1000.0000 mg | ORAL_TABLET | Freq: Four times a day (QID) | ORAL | Status: AC
  Administered 2023-07-19 – 2023-07-20 (×4): 1000 mg via ORAL
  Filled 2023-07-19 (×4): qty 2

## 2023-07-19 MED ORDER — SENNA 8.6 MG PO TABS
1.0000 | ORAL_TABLET | Freq: Two times a day (BID) | ORAL | Status: DC
  Administered 2023-07-19 – 2023-07-25 (×11): 8.6 mg via ORAL
  Filled 2023-07-19 (×12): qty 1

## 2023-07-19 MED ORDER — DIPHENHYDRAMINE HCL 12.5 MG/5ML PO ELIX: 12.5000 mg | ORAL_SOLUTION | ORAL | Status: DC | PRN

## 2023-07-19 MED ORDER — BUPIVACAINE-EPINEPHRINE (PF) 0.5% -1:200000 IJ SOLN
INTRAMUSCULAR | Status: DC | PRN
  Administered 2023-07-19: 20 mL

## 2023-07-19 MED ORDER — BUPIVACAINE HCL (PF) 0.5 % IJ SOLN
INTRAMUSCULAR | Status: DC | PRN
Start: 2023-07-19 — End: 2023-07-19
  Administered 2023-07-19: 10 mL

## 2023-07-19 MED ORDER — METHOCARBAMOL 1000 MG/10ML IJ SOLN: 500.0000 mg | Freq: Four times a day (QID) | INTRAVENOUS | Status: DC | PRN

## 2023-07-19 MED ORDER — PHENOL 1.4 % MT LIQD: 1.0000 | OROMUCOSAL | Status: DC | PRN

## 2023-07-19 MED ORDER — SODIUM CHLORIDE 0.9 % IR SOLN
Status: DC | PRN
Start: 2023-07-19 — End: 2023-07-19
  Administered 2023-07-19: 1000 mL
  Administered 2023-07-19: 3000 mL

## 2023-07-19 MED ORDER — PHENYLEPHRINE HCL-NACL 20-0.9 MG/250ML-% IV SOLN
INTRAVENOUS | Status: DC | PRN
  Administered 2023-07-19: 50 ug/min via INTRAVENOUS

## 2023-07-19 MED ORDER — DICYCLOMINE HCL 10 MG PO CAPS
10.0000 mg | ORAL_CAPSULE | Freq: Three times a day (TID) | ORAL | Status: DC
  Administered 2023-07-20 – 2023-07-26 (×16): 10 mg via ORAL
  Filled 2023-07-19 (×16): qty 1

## 2023-07-19 MED ORDER — METOCLOPRAMIDE HCL 5 MG/ML IJ SOLN: 5.0000 mg | Freq: Three times a day (TID) | INTRAMUSCULAR | Status: DC | PRN

## 2023-07-19 MED ORDER — ROSUVASTATIN CALCIUM 20 MG PO TABS
40.0000 mg | ORAL_TABLET | Freq: Every day | ORAL | Status: DC
  Administered 2023-07-20 – 2023-07-22 (×3): 40 mg via ORAL
  Filled 2023-07-19 (×4): qty 2

## 2023-07-19 MED ORDER — MENTHOL 3 MG MT LOZG
1.0000 | LOZENGE | OROMUCOSAL | Status: DC | PRN
  Administered 2023-07-20 – 2023-07-21 (×2): 3 mg via ORAL
  Filled 2023-07-19 (×3): qty 9

## 2023-07-19 MED ORDER — DEXAMETHASONE SODIUM PHOSPHATE 10 MG/ML IJ SOLN
INTRAMUSCULAR | Status: DC | PRN
  Administered 2023-07-19: 10 mg via INTRAVENOUS

## 2023-07-19 MED ORDER — ICOSAPENT ETHYL 1 G PO CAPS
2.0000 g | ORAL_CAPSULE | Freq: Two times a day (BID) | ORAL | Status: DC
  Administered 2023-07-19 – 2023-07-26 (×13): 2 g via ORAL
  Filled 2023-07-19 (×15): qty 2

## 2023-07-19 MED ORDER — SEVELAMER CARBONATE 800 MG PO TABS: 1600.0000 mg | ORAL_TABLET | ORAL | Status: DC | PRN

## 2023-07-19 MED ORDER — METOPROLOL TARTRATE 25 MG PO TABS
25.0000 mg | ORAL_TABLET | Freq: Two times a day (BID) | ORAL | Status: DC
  Filled 2023-07-19: qty 1

## 2023-07-19 MED ORDER — CHLORHEXIDINE GLUCONATE 0.12 % MT SOLN: 15.0000 mL | Freq: Once | OROMUCOSAL | Status: DC

## 2023-07-19 MED ORDER — INSULIN ASPART 100 UNIT/ML IJ SOLN
0.0000 [IU] | Freq: Three times a day (TID) | INTRAMUSCULAR | Status: DC
  Administered 2023-07-20: 2 [IU] via SUBCUTANEOUS

## 2023-07-19 MED ORDER — PHENYLEPHRINE 80 MCG/ML (10ML) SYRINGE FOR IV PUSH (FOR BLOOD PRESSURE SUPPORT)
PREFILLED_SYRINGE | INTRAVENOUS | Status: DC | PRN
  Administered 2023-07-19: 40 ug via INTRAVENOUS
  Administered 2023-07-19: 80 ug via INTRAVENOUS

## 2023-07-19 MED ORDER — HYDROMORPHONE HCL 1 MG/ML IJ SOLN
0.5000 mg | INTRAMUSCULAR | Status: DC | PRN
  Administered 2023-07-19: 1 mg via INTRAVENOUS
  Administered 2023-07-21: 0.5 mg via INTRAVENOUS
  Filled 2023-07-19 (×2): qty 1

## 2023-07-19 MED ORDER — MIDAZOLAM HCL 2 MG/2ML IJ SOLN
INTRAMUSCULAR | Status: AC
  Administered 2023-07-19: 2 mg via INTRAVENOUS
  Filled 2023-07-19: qty 2

## 2023-07-19 MED ORDER — METHOCARBAMOL 500 MG PO TABS
500.0000 mg | ORAL_TABLET | Freq: Four times a day (QID) | ORAL | Status: DC | PRN
  Administered 2023-07-21 – 2023-07-25 (×5): 500 mg via ORAL
  Filled 2023-07-19 (×5): qty 1

## 2023-07-19 MED ORDER — MIDAZOLAM HCL 2 MG/2ML IJ SOLN
INTRAMUSCULAR | Status: AC
  Filled 2023-07-19: qty 2

## 2023-07-19 MED ORDER — STERILE WATER FOR IRRIGATION IR SOLN
Status: DC | PRN
Start: 2023-07-19 — End: 2023-07-19
  Administered 2023-07-19: 1000 mL

## 2023-07-19 MED ORDER — PROPOFOL 500 MG/50ML IV EMUL
INTRAVENOUS | Status: DC | PRN
  Administered 2023-07-19: 100 mg via INTRAVENOUS
  Administered 2023-07-19: 75 ug/kg/min via INTRAVENOUS

## 2023-07-19 MED ORDER — ONDANSETRON HCL 4 MG/2ML IJ SOLN
INTRAMUSCULAR | Status: AC
  Filled 2023-07-19: qty 2

## 2023-07-19 MED ORDER — VANCOMYCIN HCL 1000 MG IV SOLR
INTRAVENOUS | Status: AC
  Filled 2023-07-19: qty 20

## 2023-07-19 MED ORDER — CHLORHEXIDINE GLUCONATE 0.12 % MT SOLN: 15.0000 mL | Freq: Once | OROMUCOSAL | Status: AC

## 2023-07-19 MED ORDER — TRANEXAMIC ACID-NACL 1000-0.7 MG/100ML-% IV SOLN
1000.0000 mg | INTRAVENOUS | Status: AC
  Administered 2023-07-19: 1000 mg via INTRAVENOUS
  Filled 2023-07-19: qty 100

## 2023-07-19 SURGICAL SUPPLY — 70 items
ADH SKN CLS APL DERMABOND .7 (GAUZE/BANDAGES/DRESSINGS)
ALCOHOL 70% 16 OZ (MISCELLANEOUS) ×1 IMPLANT
APL PRP STRL LF DISP 70% ISPRP (MISCELLANEOUS) ×1
BAG COUNTER SPONGE SURGICOUNT (BAG) ×1 IMPLANT
BAG SPNG CNTER NS LX DISP (BAG) ×1
BLADE SAW RECIP 87.9 MT (BLADE) ×1 IMPLANT
BLADE SAW RECIPROCATING 77.5 (BLADE) IMPLANT
BNDG CMPR MED 10X6 ELC LF (GAUZE/BANDAGES/DRESSINGS) ×1
BNDG ELASTIC 6X10 VLCR STRL LF (GAUZE/BANDAGES/DRESSINGS) IMPLANT
BNDG ELASTIC 6X5.8 VLCR STR LF (GAUZE/BANDAGES/DRESSINGS) ×1 IMPLANT
BOWL SMART MIX CTS (DISPOSABLE) IMPLANT
CANISTER WOUNDNEG PRESSURE 500 (CANNISTER) IMPLANT
CEMENT BONE REFOBACIN R1X40 US (Cement) IMPLANT
CHLORAPREP W/TINT 26 (MISCELLANEOUS) ×2 IMPLANT
COMP FEM KNEE STD PS 7 LT (Joint) ×1 IMPLANT
COMPONENT FEM KNEE STD PS 7 LT (Joint) IMPLANT
CUFF TOURN SGL QUICK 34 (TOURNIQUET CUFF)
CUFF TRNQT CYL 34X4.125X (TOURNIQUET CUFF) ×1 IMPLANT
DERMABOND ADVANCED .7 DNX12 (GAUZE/BANDAGES/DRESSINGS) ×1 IMPLANT
DRAIN HEMOVAC 7FR (DRAIN) ×1 IMPLANT
DRAPE DERMATAC (DRAPES) IMPLANT
DRAPE SHEET LG 3/4 BI-LAMINATE (DRAPES) ×2 IMPLANT
DRAPE U-SHAPE 47X51 STRL (DRAPES) ×1 IMPLANT
DRSG AQUACEL AG ADV 3.5X14 (GAUZE/BANDAGES/DRESSINGS) ×1 IMPLANT
DRSG TEGADERM 2-3/8X2-3/4 SM (GAUZE/BANDAGES/DRESSINGS) ×1 IMPLANT
ELECT BLADE 4.0 EZ CLEAN MEGAD (MISCELLANEOUS) ×1
ELECT REM PT RETURN 9FT ADLT (ELECTROSURGICAL) ×1
ELECTRODE BLDE 4.0 EZ CLN MEGD (MISCELLANEOUS) IMPLANT
ELECTRODE REM PT RTRN 9FT ADLT (ELECTROSURGICAL) ×1 IMPLANT
EVACUATOR 1/8 PVC DRAIN (DRAIN) IMPLANT
GLOVE BIO SURGEON STRL SZ8.5 (GLOVE) ×2 IMPLANT
GLOVE BIOGEL M 7.0 STRL (GLOVE) ×1 IMPLANT
GLOVE BIOGEL PI IND STRL 7.5 (GLOVE) ×1 IMPLANT
GLOVE BIOGEL PI IND STRL 8.5 (GLOVE) ×1 IMPLANT
GOWN STRL REUS W/ TWL XL LVL3 (GOWN DISPOSABLE) ×1 IMPLANT
GOWN STRL REUS W/TWL 2XL LVL3 (GOWN DISPOSABLE) ×1 IMPLANT
GOWN STRL REUS W/TWL XL LVL3 (GOWN DISPOSABLE) ×1
HANDPIECE INTERPULSE COAX TIP (DISPOSABLE) ×1
INSERT ARTISURF SZ 6-7 LT (Insert) IMPLANT
KIT BASIN OR (CUSTOM PROCEDURE TRAY) ×1 IMPLANT
MANIFOLD NEPTUNE II (INSTRUMENTS) ×1 IMPLANT
NDL SPNL 18GX3.5 QUINCKE PK (NEEDLE) ×2 IMPLANT
NEEDLE SPNL 18GX3.5 QUINCKE PK (NEEDLE) ×2
PACK TOTAL JOINT (CUSTOM PROCEDURE TRAY) ×2 IMPLANT
PACK UNIVERSAL I (CUSTOM PROCEDURE TRAY) ×1 IMPLANT
PADDING CAST COTTON 6X4 STRL (CAST SUPPLIES) ×1 IMPLANT
PIN DRILL HDLS TROCAR 75 4PK (PIN) IMPLANT
PREVENA RESTOR ARTHOFORM 46X30 (CANNISTER) IMPLANT
SAW OSC TIP CART 19.5X105X1.3 (SAW) ×1 IMPLANT
SCREW FEMALE HEX FIX 25X2.5 (ORTHOPEDIC DISPOSABLE SUPPLIES) IMPLANT
SEALER BIPOLAR AQUA 6.0 (INSTRUMENTS) ×1 IMPLANT
SET HNDPC FAN SPRY TIP SCT (DISPOSABLE) ×1 IMPLANT
SET PAD KNEE POSITIONER (MISCELLANEOUS) ×1 IMPLANT
STAPLER VISISTAT 35W (STAPLE) IMPLANT
STEM POLY PAT PLY 35M KNEE (Knees) IMPLANT
STEM TIB PS KNEE D 0D LT (Stem) IMPLANT
SUT MNCRL AB 3-0 PS2 27 (SUTURE) ×1 IMPLANT
SUT MON AB 2-0 CT1 36 (SUTURE) ×2 IMPLANT
SUT VIC AB 1 CTX 27 (SUTURE) ×1 IMPLANT
SUT VIC AB 1 CTX 36 (SUTURE) ×2
SUT VIC AB 1 CTX36XBRD ANBCTR (SUTURE) IMPLANT
SUT VIC AB 2-0 CT1 27 (SUTURE) ×1
SUT VIC AB 2-0 CT1 TAPERPNT 27 (SUTURE) ×1 IMPLANT
SUT VLOC 180 0 24IN GS25 (SUTURE) ×1 IMPLANT
SYR 50ML LL SCALE MARK (SYRINGE) ×2 IMPLANT
SYR CONTROL 10ML LL (SYRINGE) IMPLANT
TOWER CARTRIDGE SMART MIX (DISPOSABLE) ×1 IMPLANT
TRAY CATH INTERMITTENT SS 16FR (CATHETERS) IMPLANT
TUBE SUCT ARGYLE STRL (TUBING) IMPLANT
WRAP KNEE MAXI GEL POST OP (GAUZE/BANDAGES/DRESSINGS) ×1 IMPLANT

## 2023-07-19 NOTE — Anesthesia Procedure Notes (Signed)
Anesthesia Regional Block: Adductor canal block   Pre-Anesthetic Checklist: , timeout performed,  Correct Patient, Correct Site, Correct Laterality,  Correct Procedure, Correct Position, site marked,  Risks and benefits discussed,  Surgical consent,  Pre-op evaluation,  At surgeon's request and post-op pain management  Laterality: Left  Prep: Maximum Sterile Barrier Precautions used, chloraprep       Needles:  Injection technique: Single-shot  Needle Type: Echogenic Needle      Needle Gauge: 20     Additional Needles:   Procedures:,,,, ultrasound used (permanent image in chart),,    Narrative:  Start time: 07/19/2023 12:45 PM End time: 07/19/2023 12:50 PM Injection made incrementally with aspirations every 5 mL.  Performed by: Personally  Anesthesiologist: Mariann Barter, MD

## 2023-07-19 NOTE — Transfer of Care (Signed)
Immediate Anesthesia Transfer of Care Note  Patient: Jane [name]  Procedure(s) Performed: TOTAL KNEE ARTHROPLASTY (Left: Knee)  Patient Location: PACU  Anesthesia Type:General  Level of Consciousness: awake and oriented  Airway & Oxygen Therapy: Patient Spontanous Breathing  Post-op Assessment: Report given to RN  Post vital signs: stable  Last Vitals:  Vitals Value Taken Time  BP 113/61 07/19/23 1645  Temp    Pulse 84 07/19/23 1647  Resp 21 07/19/23 1647  SpO2 100 % 07/19/23 1647  Vitals shown include unfiled device data.  Last Pain:  Vitals:   07/19/23 1205  TempSrc:   PainSc: 5       Patients Stated Pain Goal: 0 (07/19/23 1205)  Complications: No notable events documented.

## 2023-07-19 NOTE — Anesthesia Procedure Notes (Signed)
Spinal  Patient location during procedure: OR Start time: 07/19/2023 1:22 PM End time: 07/19/2023 1:25 PM Reason for block: surgical anesthesia Staffing Anesthesiologist: Mariann Barter, MD Performed by: Mariann Barter, MD Authorized by: Mariann Barter, MD   Preanesthetic Checklist Completed: patient identified, IV checked, site marked, risks and benefits discussed, surgical consent, monitors and equipment checked, pre-op evaluation and timeout performed Spinal Block Patient position: sitting Prep: DuraPrep Patient monitoring: heart rate, cardiac monitor, continuous pulse ox and blood pressure Approach: midline Location: L3-4 Injection technique: single-shot Needle Needle type: Sprotte  Needle gauge: 24 G Needle length: 9 cm Assessment Sensory level: T4 Events: CSF return

## 2023-07-19 NOTE — Anesthesia Procedure Notes (Signed)
Procedure Name: LMA Insertion Date/Time: 07/19/2023 2:55 PM  Performed by: Caryn Bee A, CRNAPre-anesthesia Checklist: Patient identified, Emergency Drugs available, Suction available and Patient being monitored Patient Re-evaluated:Patient Re-evaluated prior to induction Oxygen Delivery Method: Circle System Utilized Preoxygenation: Pre-oxygenation with 100% oxygen Induction Type: IV induction Ventilation: Mask ventilation without difficulty LMA: LMA inserted LMA Size: 4.0 Number of attempts: 1 Airway Equipment and Method: Bite block Placement Confirmation: positive ETCO2 Tube secured with: Tape Dental Injury: Teeth and Oropharynx as per pre-operative assessment

## 2023-07-19 NOTE — Discharge Instructions (Addendum)
Dr. Samson Frederic Total Joint Specialist [facility] [address] [place], [place] [address] [phone]  TOTAL KNEE REPLACEMENT POSTOPERATIVE DIRECTIONS    Knee Rehabilitation, Guidelines Following Surgery  Results after knee surgery are often greatly improved when you follow the exercise, range of motion and muscle strengthening exercises prescribed by your doctor. Safety measures are also important to protect the knee from further injury. Any time any of these exercises cause you to have increased pain or swelling in your knee joint, decrease the amount until you are comfortable again and slowly increase them. If you have problems or questions, call your caregiver or physical therapist for advice.   WEIGHT BEARING Weight bearing as tolerated with assist device (walker, cane, etc) as directed, use it as long as suggested by your surgeon or therapist, typically at least 4-6 weeks.  HOME CARE INSTRUCTIONS  Remove items at home which could result in a fall. This includes throw rugs or furniture in walking pathways.  Continue medications as instructed at time of discharge. You may have some home medications which will be placed on hold until you complete the course of blood thinner medication.  Keep dressing clean and dry.  Walk with walker as instructed.  You may resume a sexual relationship in one month or when given the OK by your doctor.  Use walker as long as suggested by your caregivers. Avoid periods of inactivity such as sitting longer than an hour when not asleep. This helps prevent blood clots.  You may put full weight on your legs and walk as much as is comfortable.  You may return to work once you are cleared by your doctor.  Do not drive a car for 6 weeks or until released by you surgeon.  Do not drive while taking narcotics.  Wear the elastic stockings for three weeks following surgery during the day but you may remove then at night. Make sure  you keep all of your appointments after your operation with all of your doctors and caregivers. You should call the office at the above phone number and make an appointment for approximately two weeks after the date of your surgery. Do not remove your surgical dressing. Do not take tub baths or submerge the dressing. Keep dressing clean and dry.  Please pick up a stool softener and laxative for home use as long as you are requiring pain medications. ICE to the affected knee every three hours for 30 minutes at a time and then as needed for pain and swelling.  Continue to use ice on the knee for pain and swelling from surgery. You may notice swelling that will progress down to the foot and ankle.  This is normal after surgery.  Elevate the leg when you are not up walking on it.   It is important for you to complete the blood thinner medication as prescribed by your doctor. Continue to use the breathing machine which will help keep your temperature down.  It is common for your temperature to cycle up and down following surgery, especially at night when you are not up moving around and exerting yourself.  The breathing machine keeps your lungs expanded and your temperature down.  RANGE OF MOTION AND STRENGTHENING EXERCISES  Rehabilitation of the knee is important following a knee injury or an operation. After just a few days of immobilization, the muscles of the thigh which control the knee become weakened and shrink (atrophy). Knee exercises are designed to build up the  tone and strength of the thigh muscles and to improve knee motion. Often times heat used for twenty to thirty minutes before working out will loosen up your tissues and help with improving the range of motion but do not use heat for the first two weeks following surgery. These exercises can be done on a training (exercise) mat, on the floor, on a table or on a bed. Use what ever works the best and is most comfortable for you Knee exercises  include:  Leg Lifts - While your knee is still immobilized in a splint or cast, you can do straight leg raises. Lift the leg to 60 degrees, hold for 3 sec, and slowly lower the leg. Repeat 10-20 times 2-3 times daily. Perform this exercise against resistance later as your knee gets better.  Quad and Hamstring Sets - Tighten up the muscle on the front of the thigh (Quad) and hold for 5-10 sec. Repeat this 10-20 times hourly. Hamstring sets are done by pushing the foot backward against an object and holding for 5-10 sec. Repeat as with quad sets.  A rehabilitation program following serious knee injuries can speed recovery and prevent re-injury in the future due to weakened muscles. Contact your doctor or a physical therapist for more information on knee rehabilitation.   POST-OPERATIVE OPIOID TAPER INSTRUCTIONS: It is important to wean off of your opioid medication as soon as possible. If you do not need pain medication after your surgery it is ok to stop day one. Opioids include: Codeine, Hydrocodone(Norco, Vicodin), Oxycodone(Percocet, oxycontin) and hydromorphone amongst others.  Long term and even short term use of opiods can cause: Increased pain response Dependence Constipation Depression Respiratory depression And more.  Withdrawal symptoms can include Flu like symptoms Nausea, vomiting And more Techniques to manage these symptoms Hydrate well Eat regular healthy meals Stay active Use relaxation techniques(deep breathing, meditating, yoga) Do Not substitute Alcohol to help with tapering If you have been on opioids for less than two weeks and do not have pain than it is ok to stop all together.  Plan to wean off of opioids This plan should start within one week post op of your joint replacement. Maintain the same interval or time between taking each dose and first decrease the dose.  Cut the total daily intake of opioids by one tablet each day Next start to increase the time  between doses. The last dose that should be eliminated is the evening dose.    SKILLED REHAB INSTRUCTIONS: If the patient is transferred to a skilled rehab facility following release from the hospital, a list of the current medications will be sent to the facility for the patient to continue.  When discharged from the skilled rehab facility, please have the facility set up the patient's Home Health Physical Therapy prior to being released. Also, the skilled facility will be responsible for providing the patient with their medications at time of release from the facility to include their pain medication, the muscle relaxants, and their blood thinner medication. If the patient is still at the rehab facility at time of the two week follow up appointment, the skilled rehab facility will also need to assist the patient in arranging follow up appointment in our office and any transportation needs.  MAKE SURE YOU:  Understand these instructions.  Will watch your condition.  Will get help right away if you are not doing well or get worse.    Pick up stool softner and laxative for home use following  surgery while on pain medications. Do NOT remove your dressing.  Keep dressing clean and dry.  Do not take tub baths or submerge incision under water. Continue to use ice for pain and swelling after surgery. Do not use any lotions or creams on the incision until instructed by your surgeon. Please charge your Prevena negative pressure dressing nightly.    Information on my medicine - ELIQUIS (apixaban)   Why was Eliquis prescribed for you? Eliquis was prescribed for you to reduce the risk of blood clots forming after orthopedic surgery.    What do You need to know about Eliquis? Take your Eliquis TWICE DAILY - one tablet in the morning and one tablet in the evening with or without food.  It would be best to take the dose about the same time each day.  If you have difficulty swallowing the tablet  whole please discuss with your pharmacist how to take the medication safely.  Take Eliquis exactly as prescribed by your doctor and DO NOT stop taking Eliquis without talking to the doctor who prescribed the medication.  Stopping without other medication to take the place of Eliquis may increase your risk of developing a clot.  After discharge, you should have regular check-up appointments with your healthcare provider that is prescribing your Eliquis.  What do you do if you miss a dose? If a dose of ELIQUIS is not taken at the scheduled time, take it as soon as possible on the same day and twice-daily administration should be resumed.  The dose should not be doubled to make up for a missed dose.  Do not take more than one tablet of ELIQUIS at the same time.  Important Safety Information A possible side effect of Eliquis is bleeding. You should call your healthcare provider right away if you experience any of the following: Bleeding from an injury or your nose that does not stop. Unusual colored urine (red or dark brown) or unusual colored stools (red or black). Unusual bruising for unknown reasons. A serious fall or if you hit your head (even if there is no bleeding).  Some medicines may interact with Eliquis and might increase your risk of bleeding or clotting while on Eliquis. To help avoid this, consult your healthcare provider or pharmacist prior to using any new prescription or non-prescription medications, including herbals, vitamins, non-steroidal anti-inflammatory drugs (NSAIDs) and supplements.  This website has more information on Eliquis (apixaban): http://www.eliquis.com/eliquis/home

## 2023-07-19 NOTE — Interval H&P Note (Signed)
History and Physical Interval Note:  07/19/2023 1:03 PM  Jane [name]  has presented today for surgery, with the diagnosis of Left knee osteoarthritis.  The various methods of treatment have been discussed with the patient and family. After consideration of risks, benefits and other options for treatment, the patient has consented to  Procedure(s) with comments: TOTAL KNEE ARTHROPLASTY (Left) - 150 as a surgical intervention.  The patient's history has been reviewed, patient examined, no change in status, stable for surgery.  I have reviewed the patient's chart and labs.  Questions were answered to the patient's satisfaction.     Iline Oven Linsey Arteaga

## 2023-07-19 NOTE — Op Note (Signed)
OPERATIVE REPORT  SURGEON: Samson Frederic, MD   ASSISTANT: Clint Bolder, PA-C  PREOPERATIVE DIAGNOSIS: Left knee arthritis.   POSTOPERATIVE DIAGNOSIS: Left knee arthritis.   PROCEDURE: Left total knee arthroplasty.   IMPLANTS: Zimmer Persona PPS Cementless CR femur, size 7. Persona 0 degree Spiked Keel OsseoTi Tibia, size D. Vivacit-E polyethelyene insert, size 10 mm, MC. All poly 3-Peg patella, size 35 mm. Biomet R bone cement.  ANESTHESIA:  MAC and Spinal  TOURNIQUET TIME: Not utilized.   ESTIMATED BLOOD LOSS: 350 mL.    ANTIBIOTICS: 2g Ancef.  DRAINS: None.  COMPLICATIONS: None   CONDITION: PACU - hemodynamically stable.   BRIEF CLINICAL NOTE: Jane [name] is a 47 y.o. female with a long-standing history of Left knee arthritis. After failing conservative management, the patient was indicated for total knee arthroplasty. The risks, benefits, and alternatives to the procedure were explained, and the patient elected to proceed.  PROCEDURE IN DETAIL: Adductor canal block was obtained in the pre-op holding area. Once inside the operative room, spinal anesthesia was obtained, and a foley catheter was inserted. The patient was then positioned and the lower extremity was prepped and draped in the normal sterile surgical fashion.  A time-out was called verifying side and site of surgery. The patient received IV antibiotics within 60 minutes of beginning the procedure. A tourniquet was not utilized.   An anterior approach to the knee was performed utilizing a midvastus arthrotomy. A medial release was performed and the patellar fat pad was excised. A 5 degree distal femoral resection was made using an intramedullary guide. A freehand patellar resection was performed, and the patella was sized and prepared with 3 lug holes.  An extramedullary cutting guide was utilized to make a neutral proximal tibia resection, taking 3 mm of bone from the less affected lateral side with 3 degrees  of slope. The menisci were excised. A spacer block was placed, and the alignment and balance in extension were confirmed.   The distal femur was sized using the 3-degree external rotation guide referencing the posterior femoral cortex. The appropriate 4-in-1 cutting block was pinned into place. Rotation was checked using Whiteside's line, the epicondylar axis, and then confirmed with a spacer block in flexion. The remaining femoral cuts were performed, taking care to protect the MCL.  The tibia was sized and the trial tray was pinned into place. The remaining trail components were inserted. The knee was stable to varus and valgus stress through a full range of motion. The patella tracked centrally, and the PCL was well balanced. The trial components were removed, and the proximal tibial surface was prepared. Final components were impacted into place. The knee was tested for a final time and found to be well balanced.   The wound was copiously irrigated with Prontosan solution and normal saline using pulse lavage.  Marcaine solution was injected into the periarticular soft tissue.  The wound was closed in layers using #1 Vicryl and V-Loc for the fascia, 2-0 Vicryl for the subcutaneous fat, 2-0 Monocryl for the deep dermal layer, and skin staples.  Prevena Restor negative pressure dressing was placed, followed by a compressive dressing.  The patient was transported to the recovery room in stable condition.  Sponge, needle, and instrument counts were correct at the end of the case x2.  The patient tolerated the procedure well and there were no known complications.  The aquamantis was utilized for this case to help facilitate better hemostasis as patient was felt to be at  increased risk of bleeding because of preop anemia.  -minimally invasive approach.  A oscillating saw tip was utilized for this case to prevent damage to the soft tissue structures such as muscles, ligaments and tendons, and to ensure  accurate bone cuts. This patient was at increased risk for above structures due to  minimally invasive approach.  Please note that a surgical assistant was a medical necessity for this procedure in order to perform it in a safe and expeditious manner. Surgical assistant was necessary to retract the ligaments and vital neurovascular structures to prevent injury to them and also necessary for proper positioning of the limb to allow for anatomic placement of the prosthesis.

## 2023-07-20 ENCOUNTER — Encounter (HOSPITAL_COMMUNITY): Payer: Self-pay | Admitting: Orthopedic Surgery

## 2023-07-20 LAB — BASIC METABOLIC PANEL
Anion gap: 18 — ABNORMAL HIGH (ref 5–15)
BUN: 54 mg/dL — ABNORMAL HIGH (ref 6–20)
CO2: 21 mmol/L — ABNORMAL LOW (ref 22–32)
Calcium: 8.6 mg/dL — ABNORMAL LOW (ref 8.9–10.3)
Chloride: 92 mmol/L — ABNORMAL LOW (ref 98–111)
Creatinine, Ser: 10 mg/dL — ABNORMAL HIGH (ref 0.44–1.00)
GFR, Estimated: 4 mL/min — ABNORMAL LOW (ref >60)
Glucose, Bld: 256 mg/dL — ABNORMAL HIGH (ref 70–99)
Potassium: 5.2 mmol/L — ABNORMAL HIGH (ref 3.5–5.1)
Sodium: 131 mmol/L — ABNORMAL LOW (ref 135–145)

## 2023-07-20 LAB — GLUCOSE, CAPILLARY
Glucose-Capillary: 225 mg/dL — ABNORMAL HIGH (ref 70–99)
Glucose-Capillary: 226 mg/dL — ABNORMAL HIGH (ref 70–99)
Glucose-Capillary: 249 mg/dL — ABNORMAL HIGH (ref 70–99)
Glucose-Capillary: 318 mg/dL — ABNORMAL HIGH (ref 70–99)
Glucose-Capillary: 333 mg/dL — ABNORMAL HIGH (ref 70–99)

## 2023-07-20 LAB — CBC
HCT: 28.4 % — ABNORMAL LOW (ref 36.0–46.0)
Hemoglobin: 8.9 g/dL — ABNORMAL LOW (ref 12.0–15.0)
MCH: 27.4 pg (ref 26.0–34.0)
MCHC: 31.3 g/dL (ref 30.0–36.0)
MCV: 87.4 fL (ref 80.0–100.0)
Platelets: 195 10*3/uL (ref 150–400)
RBC: 3.25 MIL/uL — ABNORMAL LOW (ref 3.87–5.11)
RDW: 14.8 % (ref 11.5–15.5)
WBC: 10.9 10*3/uL — ABNORMAL HIGH (ref 4.0–10.5)
nRBC: 0 % (ref 0.0–0.2)

## 2023-07-20 LAB — HEPATITIS B SURFACE ANTIGEN: Hepatitis B Surface Ag: NONREACTIVE

## 2023-07-20 MED ORDER — INSULIN ASPART 100 UNIT/ML IJ SOLN
0.0000 [IU] | Freq: Three times a day (TID) | INTRAMUSCULAR | Status: DC
  Administered 2023-07-20 – 2023-07-21 (×3): 5 [IU] via SUBCUTANEOUS
  Administered 2023-07-22 (×2): 2 [IU] via SUBCUTANEOUS
  Administered 2023-07-22: 3 [IU] via SUBCUTANEOUS
  Administered 2023-07-23 – 2023-07-24 (×4): 5 [IU] via SUBCUTANEOUS
  Administered 2023-07-25 – 2023-07-26 (×3): 3 [IU] via SUBCUTANEOUS

## 2023-07-20 MED ORDER — SODIUM CHLORIDE 0.9 % IV BOLUS
750.0000 mL | Freq: Once | INTRAVENOUS | Status: AC
  Administered 2023-07-20: 750 mL via INTRAVENOUS

## 2023-07-20 MED ORDER — SODIUM ZIRCONIUM CYCLOSILICATE 10 G PO PACK
10.0000 g | PACK | Freq: Three times a day (TID) | ORAL | Status: AC
  Administered 2023-07-20 (×2): 10 g via ORAL
  Filled 2023-07-20 (×2): qty 1

## 2023-07-20 MED ORDER — INSULIN GLARGINE-YFGN 100 UNIT/ML ~~LOC~~ SOLN
10.0000 [IU] | Freq: Every day | SUBCUTANEOUS | Status: DC
  Administered 2023-07-20 – 2023-07-26 (×7): 10 [IU] via SUBCUTANEOUS
  Filled 2023-07-20 (×8): qty 0.1

## 2023-07-20 MED ORDER — CHLORHEXIDINE GLUCONATE CLOTH 2 % EX PADS
6.0000 | MEDICATED_PAD | Freq: Every day | CUTANEOUS | Status: DC
  Administered 2023-07-21 – 2023-07-25 (×4): 6 via TOPICAL

## 2023-07-20 NOTE — Anesthesia Postprocedure Evaluation (Signed)
Anesthesia Post Note  Patient: Jane [name]  Procedure(s) Performed: TOTAL KNEE ARTHROPLASTY (Left: Knee)     Patient location during evaluation: PACU Anesthesia Type: Regional, Spinal and General Level of consciousness: awake Pain management: pain level controlled Vital Signs Assessment: post-procedure vital signs reviewed and stable Respiratory status: spontaneous breathing, nonlabored ventilation and respiratory function stable Cardiovascular status: blood pressure returned to baseline and stable Postop Assessment: no apparent nausea or vomiting Anesthetic complications: no   No notable events documented.  Last Vitals:  Vitals:   07/20/23 0416 07/20/23 0755  BP: 106/60 112/78  Pulse: 81 80  Resp: 18 18  Temp: 36.8 C   SpO2: 98% 100%    Last Pain:  Vitals:   07/20/23 0934  TempSrc:   PainSc: 3                  Linton Rump

## 2023-07-20 NOTE — Progress Notes (Signed)
Orthopedic Tech Progress Note Patient Details:  Jane [name] [birth year] [id number]  Ortho Devices Type of Ortho Device: Knee Immobilizer Ortho Device/Splint Location: LLE Ortho Device/Splint Interventions: Ordered, Application   Post Interventions Patient Tolerated: Well Instructions Provided: Poper ambulation with device, Adjustment of device  Jane [name] 07/20/2023, 5:06 PM

## 2023-07-20 NOTE — Plan of Care (Signed)
  Problem: Education: Goal: Ability to describe self-care measures that may prevent or decrease complications (Diabetes Survival Skills Education) will improve Outcome: Progressing Goal: Individualized Educational Video(s) Outcome: Progressing   Problem: Coping: Goal: Ability to adjust to condition or change in health will improve Outcome: Progressing   Problem: Fluid Volume: Goal: Ability to maintain a balanced intake and output will improve Outcome: Progressing   Problem: Health Behavior/Discharge Planning: Goal: Ability to identify and utilize available resources and services will improve Outcome: Progressing Goal: Ability to manage health-related needs will improve Outcome: Progressing   Problem: Metabolic: Goal: Ability to maintain appropriate glucose levels will improve Outcome: Progressing   Problem: Nutritional: Goal: Maintenance of adequate nutrition will improve Outcome: Progressing Goal: Progress toward achieving an optimal weight will improve Outcome: Progressing   Problem: Skin Integrity: Goal: Risk for impaired skin integrity will decrease Outcome: Progressing   Problem: Tissue Perfusion: Goal: Adequacy of tissue perfusion will improve Outcome: Progressing   Problem: Education: Goal: Knowledge of the prescribed therapeutic regimen will improve Outcome: Progressing Goal: Individualized Educational Video(s) Outcome: Progressing   Problem: Activity: Goal: Ability to avoid complications of mobility impairment will improve Outcome: Progressing Goal: Range of joint motion will improve Outcome: Progressing   Problem: Clinical Measurements: Goal: Postoperative complications will be avoided or minimized Outcome: Progressing   Problem: Pain Management: Goal: Pain level will decrease with appropriate interventions Outcome: Progressing   Problem: Skin Integrity: Goal: Will show signs of wound healing Outcome: Progressing   Problem: Education: Goal:  Knowledge of General Education information will improve Description: Including pain rating scale, medication(s)/side effects and non-pharmacologic comfort measures Outcome: Progressing   Problem: Health Behavior/Discharge Planning: Goal: Ability to manage health-related needs will improve Outcome: Progressing   Problem: Clinical Measurements: Goal: Ability to maintain clinical measurements within normal limits will improve Outcome: Progressing Goal: Will remain free from infection Outcome: Progressing Goal: Diagnostic test results will improve Outcome: Progressing Goal: Respiratory complications will improve Outcome: Progressing Goal: Cardiovascular complication will be avoided Outcome: Progressing   Problem: Activity: Goal: Risk for activity intolerance will decrease Outcome: Progressing   Problem: Nutrition: Goal: Adequate nutrition will be maintained Outcome: Progressing   Problem: Coping: Goal: Level of anxiety will decrease Outcome: Progressing   Problem: Elimination: Goal: Will not experience complications related to bowel motility Outcome: Progressing Goal: Will not experience complications related to urinary retention Outcome: Progressing   Problem: Pain Managment: Goal: General experience of comfort will improve Outcome: Progressing   Problem: Safety: Goal: Ability to remain free from injury will improve Outcome: Progressing   Problem: Skin Integrity: Goal: Risk for impaired skin integrity will decrease Outcome: Progressing   

## 2023-07-20 NOTE — Consult Note (Signed)
Renal Service Consult Note [facility]  Jane [name] 07/20/2023 Maree Krabbe, MD Requesting Physician: Dr. Linna Caprice  Reason for Consult: ESRD pt s/p L TKA HPI: The patient is a 47 y.o. year-old w/ PMH as below who presented for elective L knee replacement surgery. Hx of DJD of the L knee. Surgery done yesterday. Today BP's are low.  Her home meds metoprolol and norvasc/ olmesartan have not been given here. Pt had HD Monday / Tuesday this week in prep for this surgery.  We are asked to see for dialysis.   Pt seen in room. BP's dropped overnight, they we good pre-op yesterday. Bed wt is 1/2 kg under her dry wt. Pt w/o CP, sob or leg swelling.    ROS - denies CP, no joint pain, no HA, no blurry vision, no rash, no diarrhea, no nausea/ vomiting   Past Medical History  Past Medical History:  Diagnosis Date   Abnormal uterine bleeding (AUB)    Anxiety    Arthritis of knee    Left, Gel and cortisone injections @ [facility]   Asthma    prn inhaler   Bipolar disorder (HCC)    CAD S/P BMS PCI to prox LAD cardiologist--- dr Judithann Sauger LAD to Mid LAD lesion, 90% stenosed. Post intervention - Vision BMS 3.0 mm x 18 mm (~3.5 mm) there is a 0% residual stenosis. 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   left 2018; right 2019   Depression    Diabetic foot ulcer (HCC) 04/22/2019   Diabetic neuropathy (HCC)    feet   Diabetic retinopathy, nonproliferative, severe (HCC)    bilateral   Dyspnea    with too much fluid   ESRD (end stage renal disease) on dialysis [facility])    ?chronic interstitial nephritis  ([facility]   H/O non-ST elevation myocardial infarction (NSTEMI) 03/2016   Found in 90% mid LAD lesion treated with bare-metal stent (BMS) PCI - vision BMS 3.0 mm x 18 mm   Heart murmur    History of blood transfusion    History of MRSA infection 2007    Hyperlipidemia    Hypertension    IDA (iron deficiency anemia)    Insulin dependent type 2 diabetes mellitus (HCC)    Iron deficiency anemia    takes iron supplement   Myocardial infarction [facility])    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   PAOD (peripheral arterial occlusive disease) (HCC)    vascular--- dr Imogene Burn   Pneumonia 11/08/2021   PONV (postoperative nausea and vomiting)    S/P arteriovenous (AV) fistula creation 07/2017   S/p bare metal coronary artery stent 03/31/2016   BMS x1  to pLAD   Sickle cell trait (HCC)    Ulcer of left foot due to type 2 diabetes mellitus (HCC) 06/03/2019   Past Surgical History  Past Surgical History:  Procedure Laterality Date   A/V FISTULAGRAM N/A 04/13/2021   Procedure: A/V FISTULAGRAM - Right Arm;  Surgeon: Nada Libman, MD;  [facility] INV[facility] CV [facility];  Service: Cardiovascular;  Laterality: N/A;   ACHILLES TENDON LENGTHENING Left 11/24/2016   Procedure: Left Achilles tendon lengthening (open);  Surgeon: Toni Arthurs, MD;  Location: [facility];  Service: Orthopedics;  Laterality: Left;   AV FISTULA PLACEMENT Right 07/31/2017   Procedure: ARTERIOVENOUS BRACHIOCEPHALIC FISTULA  CREATION RIGHT ARM;  Surgeon: Fransisco Hertz, MD;  Location: [facility] OR;  Service: Vascular;  Laterality: Right;   CALCANEAL OSTEOTOMY Left 11/24/2016   Procedure: Left hindfoot osteotomy and fusion;  Surgeon: Toni Arthurs, MD;  Location: [facility];  Service: Orthopedics;  Laterality: Left;   CARDIAC CATHETERIZATION N/A 03/31/2016   Procedure: Left Heart Cath and Coronary Angiography;  Surgeon: Runell Gess, MD;  Location: [facility] INVASIVE CV LAB: 90% early mLAD, normal LV Fxn   CARDIAC CATHETERIZATION N/A 03/31/2016   Procedure: Coronary Stent Intervention;  Surgeon: Runell Gess, MD;  Location: [facility] INVASIVE CV LAB;  Service: Cardiovascular: PCI to mLAD BMS Vision 3.0 mm x 18 mm   CESAREAN SECTION  1997    CYSTOSCOPY WITH FULGERATION N/A 08/30/2022   Procedure: CYSTOSCOPY WITH FULGERATION, BIOPSY, LEFT RETROGRADE PYELOGRAM;  Surgeon: Noel Christmas, MD;  Location: [facility] ORS;  Service: Urology;  Laterality: N/A;   DILITATION & CURRETTAGE/HYSTROSCOPY WITH NOVASURE ABLATION N/A 09/15/2020   Procedure: DILATATION & CURETTAGE/HYSTEROSCOPY;  Surgeon: Willodean Rosenthal, MD;  Location: [facility];  Service: Gynecology;  Laterality: N/A;   DILITATION & CURRETTAGE/HYSTROSCOPY WITH NOVASURE ABLATION N/A 12/21/2021   Procedure: DILATATION & CURETTAGE/HYSTEROSCOPY WITH NOVASURE ABLATION/ IUD REMOVAL;  Surgeon: Willodean Rosenthal, MD;  Location: [facility];  Service: Gynecology;  Laterality: N/A;   FISTULA SUPERFICIALIZATION Right 11/15/2017   Procedure: FISTULA SUPERFICIALIZATION RIGHT BRACHIOCEPHALIC  RIGHT;  Surgeon: Fransisco Hertz, MD;  Location: [facility] OR;  Service: Vascular;  Laterality: Right;   FOOT SURGERY     multiple for charcot   PERIPHERAL VASCULAR BALLOON ANGIOPLASTY Right 04/13/2021   Procedure: PERIPHERAL VASCULAR BALLOON ANGIOPLASTY;  Surgeon: Nada Libman, MD;  Location: [facility] INVASIVE CV LAB;  Service: Cardiovascular;  Laterality: Right;  arm fistula   TRANSTHORACIC ECHOCARDIOGRAM  02/2021   EF 55 to 60%.  No R WMA.  Mild septal LVH with moderate posterior hypertrophy.  Normal RV size and function.  Normal RVP/PASP.Marland Kitchen  Trivial MR.   TUBAL LIGATION  1997   Family History  Family History  Problem Relation Age of Onset   Stroke Mother    Diabetes Mother    Hypertension Mother    Aneurysm Mother    Diabetes Father    Hypertension Father    Diabetes Sister    Hypertension Sister    Diabetes Maternal Grandmother    Diabetes Maternal Grandfather    Cancer Paternal Grandfather        Prostate   Social History  reports that she quit smoking about 8 years ago. Her smoking use included cigarettes. She has never used smokeless tobacco. She reports that she  does not currently use alcohol. She reports that she does not currently use drugs. Allergies  Allergies  Allergen Reactions   Adhesive [Tape] Other (See Comments)    Irritation    Home medications Prior to Admission medications   Medication Sig Start Date End Date Taking? Authorizing Provider  acetaminophen (TYLENOL) 500 MG tablet Take 1,000 mg by mouth every 6 (six) hours as needed for moderate pain.   Yes [provider]  albuterol (PROVENTIL HFA) 108 (90 Base) MCG/ACT inhaler Inhale 2 puffs into the lungs every 4 (four) hours as needed for coughing, wheezing, or shortness of breath. 12/26/22  Yes Lyndle Herrlich, MD  alfuzosin (UROXATRAL) 10 MG 24 hr tablet Take 10 mg by mouth in the morning and at bedtime.   Yes [provider]  amLODipine-olmesartan (AZOR) 5-20 MG  tablet Take 1 tablet by mouth every night 10/19/22  Yes   aspirin EC 81 MG tablet Take 1 tablet (81 mg total) by mouth daily. Swallow whole. 11/23/21  Yes Marykay Lex, MD  cetirizine (ZYRTEC) 10 MG tablet Take 10 mg by mouth daily as needed for allergies.   Yes [provider]  Cholecalciferol (VITAMIN D) 125 MCG (5000 UT) CAPS Take 10,000 Units by mouth daily.   Yes [provider]  dicyclomine (BENTYL) 10 MG capsule Take 1 capsule (10 mg total) by mouth 3 (three) times daily before meals. 03/13/23  Yes Lyndle Herrlich, MD  DULoxetine (CYMBALTA) 30 MG capsule Take 1 capsule (30 mg total) by mouth daily. 06/26/23 06/25/24 Yes Bender, Irving Burton, DO  estradiol (ESTRACE VAGINAL) 0.1 MG/GM vaginal cream Place 1 Applicatorful vaginally nightly for 2 weeks then 2 nights a week afterwards. 06/16/23  Yes Lorriane Shire, MD  gabapentin (NEURONTIN) 100 MG capsule Take 1 capsule (100 mg total) by mouth at bedtime. 03/03/22  Yes Gardenia Phlegm, MD  icosapent Ethyl (VASCEPA) 1 g capsule Take 2 capsules (2 g total) by mouth 2 (two) times daily. 06/14/23  Yes West, Katlyn D, NP  insulin degludec  (TRESIBA) 100 UNIT/ML FlexTouch Pen Inject 24 Units into the skin at bedtime. 03/13/23  Yes Lyndle Herrlich, MD  insulin lispro (HUMALOG KWIKPEN) 100 UNIT/ML KwikPen Use 6-20 units as directed before meals three times a day 03/16/23  Yes Sridharan, Sriramkumar, MD  lanthanum (FOSRENOL) 1000 MG chewable tablet Chew 1 tablet (1,000 mg total) by mouth 3 (three) times daily with meals. 08/11/21  Yes   leflunomide (ARAVA) 10 MG tablet Take 1 tablet (10 mg total) by mouth daily. 05/16/23  Yes   megestrol (MEGACE) 40 MG tablet Take 1 tablet (40 mg total) by mouth 2 (two) times daily. 04/06/22  Yes Willodean Rosenthal, MD  metoprolol tartrate (LOPRESSOR) 25 MG tablet Take 1 tablet (25 mg total) by mouth 2 (two) times daily. Do not take the night before dialysis, and take 2 hours after dialysis. 03/16/23  Yes Marykay Lex, MD  Multiple Vitamins-Minerals (HAIR SKIN & NAILS) TABS Take 1 tablet by mouth daily.   Yes [provider]  multivitamin (RENA-VIT) TABS tablet Take 1 tablet by mouth daily. 05/13/22  Yes Gust Rung, DO  oxyCODONE-acetaminophen (PERCOCET) 10-325 MG tablet Take 1 tablet by mouth 5 (five) times daily as needed 05/29/23  Yes   promethazine (PHENERGAN) 12.5 MG tablet Take 1 tablet (12.5 mg total) by mouth every 6 (six) hours as needed for nausea or vomiting. 06/21/23  Yes Champ Mungo, DO  rosuvastatin (CRESTOR) 40 MG tablet Take 1 tablet (40 mg total) by mouth daily. 06/14/23  Yes Reather Littler D, NP  sevelamer carbonate (RENVELA) 800 MG tablet Take 1,600-3,200 mg by mouth See admin instructions. Take 3200 mg with each meal and 1600 mg with each snack   Yes [provider]  Accu-Chek Softclix Lancets lancets Use to check blood sugar up to 3 (three) times daily. 05/23/22   Doran Stabler, DO  blood glucose meter kit and supplies Dispense based on patient and insurance preference. Use up to four times daily as directed. (FOR ICD-10 E10.9, E11.9). 01/29/21   Evlyn Kanner, MD   blood glucose meter kit and supplies Use in the morning, at noon, and at bedtime. 03/13/23   Lyndle Herrlich, MD  Blood Glucose Monitoring Suppl (ACCU-CHEK GUIDE) w/Device KIT 1 each by Does not apply route 3 (three) times daily. 05/18/17  Nyra Market, MD  Cholecalciferol (VITAMIN D3) 50 MCG (2000 UT) CAPS Take 4 capsules (8,000 Units total) by mouth daily. Patient not taking: Reported on 07/03/2023 02/24/21     Continuous Glucose Receiver (DEXCOM G7 RECEIVER) DEVI Use  to continuously monitor your blood sugars. 03/02/23   Lyndle Herrlich, MD  Continuous Glucose Sensor (DEXCOM G7 SENSOR) MISC Use it for 10 days to continuously monitor your blood sugars. 06/26/23   Faith Rogue, DO  glucose blood (ACCU-CHEK GUIDE) test strip Use to check blood sugar up to 3 (three) times daily. 05/23/22   Doran Stabler, DO  Glucose Blood (BLOOD GLUCOSE TEST STRIPS) STRP Check blood sugars fasting and then with each meal up to 4 times a day. 03/13/23   Lyndle Herrlich, MD  guanFACINE (INTUNIV) 1 MG TB24 ER tablet Take 1 tablet (1 mg total) by mouth daily. Patient not taking: Reported on 07/03/2023 05/02/22   Andrey Campanile, MD  injection device for insulin (INPEN 100-PINK-LILLY-HUMALOG) DEVI Use to inject Humalog insulin for meals and correction insulin 03/15/23   Lyndle Herrlich, MD  insulin lispro (HUMALOG) 100 UNIT/ML cartridge Inject 6-20 Units total into the skin 3 (three) times daily with meals. Use with inpen to inject at meal time and for correction insulin up to 40 units daily Patient not taking: Reported on 07/03/2023 03/13/23   Lyndle Herrlich, MD  Insulin Pen Needle 32G X 4 MM MISC Use to inject insulin 4 (four) times daily. 03/03/22   Gardenia Phlegm, MD  Insulin Syringe-Needle U-100 31G X 15/64" 0.3 ML MISC Use to inject insulin daily 08/17/18   Earl Lagos, MD  Lancet Device MISC Use 1 each in the morning, at noon, and at bedtime. 03/13/23   Lyndle Herrlich, MD   Lancets [facility] DELICA PLUS [place]) MISC 1 each in the morning, at noon, and at bedtime. 03/13/23   Lyndle Herrlich, MD  oxyCODONE-acetaminophen (PERCOCET) 10-325 MG tablet Take 1 tablet by mouth 5 (five) times daily as needed. 07/03/23     polyethylene glycol (MIRALAX / GLYCOLAX) 17 g packet Take 17 g by mouth daily as needed for moderate constipation.    [provider]  Sodium Fluoride 1.1 % PSTE Brush teeth with small amount 2 times a day Patient not taking: Reported on 07/03/2023 10/05/21     Continuous Blood Gluc Transmit (DEXCOM G6 TRANSMITTER) MISC Use to check blood sugar at least 6 times a day 03/03/22 03/13/23  Gardenia Phlegm, MD     Vitals:   07/19/23 1928 07/20/23 0416 07/20/23 0755 07/20/23 1046  BP: 90/63 106/60 112/78 (!) 101/50  Pulse: 78 81 80 80  Resp:  18 18   Temp: 97.8 F (36.6 C) 98.2 F (36.8 C)    TempSrc: Oral Oral    SpO2: 96% 98% 100%   Weight:      Height:       Exam Gen alert, no distress No rash, cyanosis or gangrene Sclera anicteric, throat clear  No jvd or bruits Chest clear bilat to bases, no rales/ wheezing RRR no MRG Abd soft ntnd no mass or ascites +bs GU defer MS L knee is wrapped Ext no LE or UE edema, no wounds or ulcers Neuro is alert, Ox 3 , nf    RUA AVF      Home meds include - uroxatral, amlodipine-olmesartan 5-20 every day, aspirin, duloxetine, estsradiol, gabapentin 100 hs, insulin degludec/ lispro, lanthanum 1 gm ac tid, leflunomide, megestrol, metoprolol 25 bid, MVI, renavite, percocet prn, rosuvastatin,  sevelamer 3200mg  ac tid, prns     OP HD: MWF [facility]  3h  450/1.5    93.1kg   2/2.5 bath  RUA AVF   Heparin 2500 - last OP HD 9/03, post wt 93.1kg   - hectorol 7 mcg IV three times per week - mircera 75 mcg IV q 2 wks, last 8/28, due 9/11    Assessment/ Plan: S/P L TKA - per ortho ESRD - on HD MWF. Had OP HD Monday/ Tuesday this week. At dry wt and labs are ok. Next HD will be Friday.  HTN/  volume - euvolemic on exam, looks a bit dry. Will give NS bolus 750 cc to help w/ low BP's.  Home HTN meds are on hold for now.  Anemia esrd - Hb 12 > 8.9 due to abl. Follow.  MBD ckd - Ca in range. Follow. Cont binders.  Hyperkalemia - mild 5.2, renal diet and lokelma. HD friday      Jane Moselle  MD [facility] 07/20/2023, 11:21 AM  Recent Labs  Lab 07/19/23 1207 07/20/23 0711  HGB 12.9 8.9*  CALCIUM  --  8.6*  CREATININE 9.30* 10.00*  K 5.2* 5.2*   Inpatient medications:  acetaminophen  1,000 mg Oral Q6H   alfuzosin  10 mg Oral BID AC & HS   apixaban  2.5 mg Oral Q12H   dicyclomine  10 mg Oral TID AC   docusate sodium  100 mg Oral BID   DULoxetine  30 mg Oral Daily   [START ON 07/21/2023] estradiol  1 Applicatorful Vaginal Once per day on Monday Friday   gabapentin  100 mg Oral QHS   icosapent Ethyl  2 g Oral BID   insulin aspart  0-15 Units Subcutaneous TID WC   insulin aspart  0-5 Units Subcutaneous QHS   insulin glargine-yfgn  10 Units Subcutaneous Daily   loratadine  10 mg Oral Daily   megestrol  40 mg Oral BID   rosuvastatin  40 mg Oral Daily   senna  1 tablet Oral BID   sevelamer carbonate  3,200 mg Oral TID WC    sodium chloride Stopped (07/20/23 1040)   methocarbamol (ROBAXIN) IV     acetaminophen, albuterol, alum & mag hydroxide-simeth, diphenhydrAMINE, HYDROmorphone (DILAUDID) injection, menthol-cetylpyridinium **OR** phenol, methocarbamol **OR** methocarbamol (ROBAXIN) IV, metoCLOPramide **OR** metoCLOPramide (REGLAN) injection, ondansetron **OR** ondansetron (ZOFRAN) IV, oxyCODONE, oxyCODONE, polyethylene glycol, promethazine, sevelamer carbonate

## 2023-07-20 NOTE — Evaluation (Signed)
Physical Therapy Evaluation Patient Details Name: Jane [name] MRN: [id number] DOB: 30-Sep-1976 Today's Date: 07/20/2023  History of Present Illness  Patient is 47 y.o. female s/p Lt TKA on 07/19/23 with PMH significant for anxiety, depression, asthma, BD, DM2, neuropathy, ESRD, bil charcot foot, HTN, HLD, hx of NSTEMI.   Clinical Impression  Jane [name] is a 47 y.o. female POD 1 s/p Lt TKA. Patient reports independence with mobility at baseline. Patient is now limited by functional impairments (see PT problem list below). Patient was limited this eval by significant hypotension and reporting dizziness/light headedness in bed (see vitals below). Patient's BP recovered in trendelenburg and RN notified. Patient instructed in exercise to facilitate ROM and circulation to manage edema. Patient will benefit from continued skilled PT interventions to address impairments and progress towards PLOF. Acute PT will follow to progress mobility in preparation for safe discharge.         If plan is discharge home, recommend the following: Two people to help with walking and/or transfers;A lot of help with bathing/dressing/bathroom;Assistance with cooking/housework;Assist for transportation;Help with stairs or ramp for entrance;Supervision due to cognitive status   Can travel by private vehicle        Equipment Recommendations Rolling walker (2 wheels);BSC/3in1 (shower seat)  Recommendations for Other Services       Functional Status Assessment Patient has had a recent decline in their functional status and demonstrates the ability to make significant improvements in function in a reasonable and predictable amount of time.     Precautions / Restrictions Precautions Precautions: Fall Restrictions Weight Bearing Restrictions: No Other Position/Activity Restrictions: WBAT      Mobility  Bed Mobility               General bed mobility comments: unable to progress to EOB due to  hypotension in sitting upright in bed. pt moved to trendelenburg and BP improved. RN notified.    Transfers                        Ambulation/Gait                  Stairs            Wheelchair Mobility     Tilt Bed    Modified Rankin (Stroke Patients Only)          Pertinent Vitals/Pain Pain Assessment Pain Assessment: Faces Faces Pain Scale: Hurts little more Pain Location: Lt knee with ROM Pain Descriptors / Indicators: Aching, Discomfort, Grimacing Pain Intervention(s): Limited activity within patient's tolerance, Monitored during session, Premedicated before session, Repositioned    Home Living Family/patient expects to be discharged to:: Private residence Living Arrangements: Alone Available Help at Discharge: Family Type of Home: House Home Access: Stairs to enter Entrance Stairs-Rails: None Entrance Stairs-Number of Steps: 1 Alternate Level Stairs-Number of Steps: 12 Home Layout: Two level;Bed/bath upstairs Home Equipment: None      Prior Function Prior Level of Function : Independent/Modified Independent;Driving                     Extremity/Trunk Assessment   Upper Extremity Assessment Upper Extremity Assessment: Overall WFL for tasks assessed    Lower Extremity Assessment Lower Extremity Assessment: LLE deficits/detail;RLE deficits/detail RLE Deficits / Details: WFLs grossly 4/5 RLE Sensation: history of peripheral neuropathy RLE Coordination: WNL LLE Deficits / Details: poor quad activation, extensor lag with attempted SLR; wound vac in place LLE Sensation: history of peripheral  neuropathy LLE Coordination: decreased gross motor    Cervical / Trunk Assessment Cervical / Trunk Assessment: Normal  Communication   Communication Communication: No apparent difficulties  Cognition Arousal: Alert Behavior During Therapy: WFL for tasks assessed/performed Overall Cognitive Status: Within Functional Limits for tasks  assessed                                          General Comments      Exercises Total Joint Exercises Ankle Circles/Pumps: AROM, Both, 15 reps, Supine Quad Sets: AROM, Left, 5 reps, Supine Heel Slides: AAROM, Left, 10 reps, Supine Hip ABduction/ADduction: AROM, Left, 10 reps, Supine   Assessment/Plan    PT Assessment Patient needs continued PT services  PT Problem List Decreased strength;Decreased range of motion;Decreased activity tolerance;Decreased balance;Decreased mobility;Decreased knowledge of use of DME;Decreased safety awareness;Decreased knowledge of precautions;Cardiopulmonary status limiting activity;Obesity;Impaired sensation;Pain       PT Treatment Interventions DME instruction;Gait training;Stair training;Functional mobility training;Therapeutic activities;Therapeutic exercise;Balance training;Neuromuscular re-education;Cognitive remediation;Patient/family education;Wheelchair mobility training;Manual techniques    PT Goals (Current goals can be found in the Care Plan section)  Acute Rehab PT Goals Patient Stated Goal: regain strength and independence to return home PT Goal Formulation: With patient Time For Goal Achievement: 07/27/23 Potential to Achieve Goals: Good    Frequency Min 1X/week     Co-evaluation               AM-PAC PT "6 Clicks" Mobility  Outcome Measure Help needed turning from your back to your side while in a flat bed without using bedrails?: Total Help needed moving from lying on your back to sitting on the side of a flat bed without using bedrails?: Total Help needed moving to and from a bed to a chair (including a wheelchair)?: Total Help needed standing up from a chair using your arms (e.g., wheelchair or bedside chair)?: Total Help needed to walk in hospital room?: Total Help needed climbing 3-5 steps with a railing? : Total 6 Click Score: 6    End of Session   Activity Tolerance: Treatment limited  secondary to medical complications (Comment) (hypotensive) Patient left: in bed;with call bell/phone within reach;with bed alarm set;with nursing/sitter in room Nurse Communication: Mobility status;Other (comment) (vitals) PT Visit Diagnosis: Muscle weakness (generalized) (M62.81);Difficulty in walking, not elsewhere classified (R26.2);Unsteadiness on feet (R26.81);Other abnormalities of gait and mobility (R26.89);Pain Pain - Right/Left: Left Pain - part of body: Knee    Time: 2956-2130 PT Time Calculation (min) (ACUTE ONLY): 29 min   Charges:   PT Evaluation $PT Eval Low Complexity: 1 Low PT Treatments $Therapeutic Exercise: 8-22 mins PT General Charges $$ ACUTE PT VISIT: 1 Visit         Wynn Maudlin, DPT Acute Rehabilitation Services Office [phone]  07/20/23 1:38 PM

## 2023-07-20 NOTE — Inpatient Diabetes Management (Addendum)
Inpatient Diabetes Program Recommendations  AACE/ADA: New Consensus Statement on Inpatient Glycemic Control (2015)  Target Ranges:  Prepandial:   less than 140 mg/dL      Peak postprandial:   less than 180 mg/dL (1-2 hours)      Critically ill patients:  140 - 180 mg/dL   Lab Results  Component Value Date   GLUCAP 225 (H) 07/20/2023   HGBA1C 7.7 (H) 07/07/2023    Review of Glycemic Control  Latest Reference Range & Units 07/19/23 11:40 07/19/23 15:11 07/19/23 16:57 07/19/23 19:37 07/20/23 00:13 07/20/23 08:21  Glucose-Capillary 70 - 99 mg/dL 161 (H) 096 (H) 045 (H) 153 (H) 333 (H) 225 (H)   Diabetes history: DM 2 Outpatient Diabetes medications: Tresiba 24 units qhs, Humalog 6-20 units tid, Dexcom G7 CGM Current orders for Inpatient glycemic control:  Novolog 0-6 units tid + hs  Inpatient Diabetes Program Recommendations:    Note: Pt given decadron 10 mg yesterday. Pt also on long acting basal insulin at home.   -   Consider starting Semglee 10 units  Spoke with pt at bedside. Pt has Dexcom G7 CGM placed on left upper arm on 9/4. Told pt she is able to check her glucose while here but will need [place] to [place] our meters to double check glucose trends while here for accuracy. Discussed glucose control while inpatient. Will continue to monitor glucose trends.   Thanks,  Christena Deem RN, MSN, BC-ADM Inpatient Diabetes Coordinator Team Pager [phone] (8a-5p)

## 2023-07-20 NOTE — Progress Notes (Addendum)
    Subjective:  Patient reports pain as mild to moderate.  Denies N/V/CP/SOB/Abd pain. She reports her pain is well controlled this morning.   She is normally on oxycodone 10/325 3-5x/day per baseline. Will continue to monitor pain management levels.   Discussed Prevena vac with patient this morning. All questions solicited and answered.   Objective:   VITALS:   Vitals:   07/19/23 1730 07/19/23 1750 07/19/23 1928 07/20/23 0416  BP: 126/70 107/68 90/63 106/60  Pulse: 89 86 78 81  Resp: 13 17  18   Temp: 98 F (36.7 C) 98.3 F (36.8 C) 97.8 F (36.6 C) 98.2 F (36.8 C)  TempSrc:  Oral Oral Oral  SpO2: 100% 100% 96% 98%  Weight:      Height:        Patient sitting up in bed. NAD.  Neurologically intact ABD soft Neurovascular intact Sensation intact distally Intact pulses distally Dorsiflexion/Plantar flexion intact No cellulitis present Compartment soft Prevena incisional dressing C/D/I. House vac no leaks detected. Prevena portable unit in room.   Lab Results  Component Value Date   WBC 6.7 04/15/2023   HGB 12.9 07/19/2023   HCT 38.0 07/19/2023   MCV 86.0 04/15/2023   PLT 176 04/15/2023   BMET    Component Value Date/Time   NA 134 (L) 07/19/2023 1207   NA 142 12/21/2017 1526   NA 144 09/20/2017 0000   K 5.2 (H) 07/19/2023 1207   K 5.4 09/20/2017 0000   CL 98 07/19/2023 1207   CL 112 09/20/2017 0000   CO2 27 04/15/2023 2222   CO2 22 09/20/2017 0000   GLUCOSE 198 (H) 07/19/2023 1207   BUN 48 (H) 07/19/2023 1207   BUN 69 (H) 12/21/2017 1526   CREATININE 9.30 (H) 07/19/2023 1207   CREATININE 3.40 (H) 03/27/2017 0759   CALCIUM 8.0 (L) 04/15/2023 2222   CALCIUM 8.6 (L) 11/24/2017 1420   GFRNONAA 4 (L) 04/15/2023 2222   GFRNONAA 9 (L) 09/20/2017 0000   GFRNONAA 65 06/09/2015 1211     Assessment/Plan: 1 Day Post-Op   Principal Problem:   Osteoarthritis of left knee    WBAT with walker DVT ppx:  Eliquis , SCDs, TEDS PO pain  control PT/OT: PT has not seen yet. PT to come by today. Dispo:  - Labs ordered but still not drawn yet. - Patient has still not voided, bladder scan this morning showing 0 mL. Patient reports pretty normal per her baseline. Will continue to monitor.  - Blood glucose 333 this morning. Consult placed to diabetes coordinator this morning for help with management.   - Prevena incsional dressing intact. Upon discharge nurse to convert house vac to portable prevena unit. Patient to follow-up within 7 days of discharge for removal of dressing.   Addend: Consulted Nephrology Dr. Arlean Hopping about dialysis today vs tomorrow. They are going to work on dialysis today. D/c pending dialysis, PT clearance, and pain control.   Clois Dupes, PA-C 07/20/2023, 7:15 AM   [facility]  [place] [address], [place], [place] [address] Phone: [phone] [email or web address] Facebook  [employer]

## 2023-07-20 NOTE — Progress Notes (Signed)
Physical Therapy Treatment Patient Details Name: Jane [name] MRN: [id number] DOB: 1976-03-08 Today's Date: 07/20/2023   History of Present Illness Patient is 47 y.o. female s/p Lt TKA on 07/19/23 with PMH significant for anxiety, depression, asthma, BD, DM2, neuropathy, ESRD, bil charcot foot, HTN, HLD, hx of NSTEMI.    PT Comments  Patient resting in bed and eager to work with therapy. Pt able to initiate bringing bil LE's off EOB with extra effort to lift Lt LE, no significant extensor lag noted this visit. Pt c/o lightheadedness sitting EOB but remained alert and conversant with therapist throughout. Bil Le exercises completed at EOB to facilitate BP recovery and once BP at 105/60 pt reports symptoms improved. Pt able to complete sit<>stand from EOB to RW, min assist to rise with cues for upright posture. No buckling noted at Lt knee. Pt reported worsening dizziness,nausea, and hot sensation standing and return to EOB then supine. Will continue to progress pt as able during hospital stay.  Vitals 73/38 mmHg (seated EOB) 78/42 mmHg (seated EOB) 81/38 mmHg (seated EOB) 105/60 mmHg (Seated EOB prior to stand)     If plan is discharge home, recommend the following: Two people to help with walking and/or transfers;A lot of help with bathing/dressing/bathroom;Assistance with cooking/housework;Assist for transportation;Help with stairs or ramp for entrance;Supervision due to cognitive status   Can travel by private vehicle        Equipment Recommendations  Rolling walker (2 wheels);BSC/3in1 (shower seat)    Recommendations for Other Services       Precautions / Restrictions Precautions Precautions: Fall Restrictions Weight Bearing Restrictions: No Other Position/Activity Restrictions: WBAT     Mobility  Bed Mobility Overal bed mobility: Needs Assistance Bed Mobility: Supine to Sit, Sit to Supine     Supine to sit: Contact guard, HOB elevated, Used rails Sit to supine: Min  assist   General bed mobility comments: pt taking extra time and use of bed features to move LE's off EOB, raise trunk, and pivot to sit upright. Pt required min assist EOS to return to supine.    Transfers Overall transfer level: Needs assistance Equipment used: Rolling walker (2 wheels) Transfers: Sit to/from Stand Sit to Stand: Min assist           General transfer comment: Min assist to power up and block Lt knee to maintain extension. Lt knee stable with no buckling noted. pt became symptomatic and returned to sit EOB quickly then supine.    Ambulation/Gait                   Stairs             Wheelchair Mobility     Tilt Bed    Modified Rankin (Stroke Patients Only)       Balance                                            Cognition Arousal: Alert Behavior During Therapy: WFL for tasks assessed/performed Overall Cognitive Status: Within Functional Limits for tasks assessed                                          Exercises Total Joint Exercises Ankle Circles/Pumps: AROM, Both, 15 reps, Seated Quad Sets: AROM, Left,  5 reps, Supine Heel Slides: AAROM, Left, 10 reps, Seated Hip ABduction/ADduction: AROM, Left, 10 reps, Supine Long Arc Quad: AAROM, Left, Right, AROM, 10 reps, Seated    General Comments        Pertinent Vitals/Pain Pain Assessment Pain Assessment: Faces Faces Pain Scale: Hurts little more Pain Location: Lt knee with ROM Pain Descriptors / Indicators: Aching, Discomfort, Grimacing Pain Intervention(s): Limited activity within patient's tolerance, Monitored during session, Premedicated before session, Repositioned    Home Living                          Prior Function            PT Goals (current goals can now be found in the care plan section) Acute Rehab PT Goals Patient Stated Goal: regain strength and independence to return home PT Goal Formulation: With  patient Time For Goal Achievement: 07/27/23 Potential to Achieve Goals: Good Progress towards PT goals: Progressing toward goals    Frequency    Min 1X/week      PT Plan      Co-evaluation              AM-PAC PT "6 Clicks" Mobility   Outcome Measure  Help needed turning from your back to your side while in a flat bed without using bedrails?: A Little Help needed moving from lying on your back to sitting on the side of a flat bed without using bedrails?: A Little Help needed moving to and from a bed to a chair (including a wheelchair)?: A Little Help needed standing up from a chair using your arms (e.g., wheelchair or bedside chair)?: A Little Help needed to walk in hospital room?: A Lot Help needed climbing 3-5 steps with a railing? : Total 6 Click Score: 15    End of Session Equipment Utilized During Treatment: Gait belt Activity Tolerance: Treatment limited secondary to medical complications (Comment) (hypotensive) Patient left: in bed;with call bell/phone within reach;with bed alarm set;with nursing/sitter in room Nurse Communication: Mobility status;Other (comment) (vitals) PT Visit Diagnosis: Muscle weakness (generalized) (M62.81);Difficulty in walking, not elsewhere classified (R26.2);Unsteadiness on feet (R26.81);Other abnormalities of gait and mobility (R26.89);Pain Pain - Right/Left: Left Pain - part of body: Knee     Time: 0347-4259 PT Time Calculation (min) (ACUTE ONLY): 27 min  Charges:    $Therapeutic Exercise: 8-22 mins $Therapeutic Activity: 8-22 mins PT General Charges $$ ACUTE PT VISIT: 1 Visit                     Wynn Maudlin, DPT Acute Rehabilitation Services Office [phone]  07/20/23 4:47 PM

## 2023-07-20 NOTE — Progress Notes (Addendum)
Lab unable to obtained today. Patient asked to have lab drawn in HD tomorrow.for Hep b surface AG/Hep B surface AG quant. Spoke to Dr Arlean Hopping, pt needs these labs prior to HD tomorrow. Phlebotomist updated.

## 2023-07-21 ENCOUNTER — Other Ambulatory Visit: Payer: Self-pay

## 2023-07-21 ENCOUNTER — Encounter (HOSPITAL_COMMUNITY): Payer: Self-pay

## 2023-07-21 LAB — BASIC METABOLIC PANEL
Anion gap: 15 (ref 5–15)
BUN: 70 mg/dL — ABNORMAL HIGH (ref 6–20)
CO2: 19 mmol/L — ABNORMAL LOW (ref 22–32)
Calcium: 8 mg/dL — ABNORMAL LOW (ref 8.9–10.3)
Chloride: 95 mmol/L — ABNORMAL LOW (ref 98–111)
Creatinine, Ser: 11.22 mg/dL — ABNORMAL HIGH (ref 0.44–1.00)
GFR, Estimated: 4 mL/min — ABNORMAL LOW (ref >60)
Glucose, Bld: 174 mg/dL — ABNORMAL HIGH (ref 70–99)
Potassium: 4.8 mmol/L (ref 3.5–5.1)
Sodium: 129 mmol/L — ABNORMAL LOW (ref 135–145)

## 2023-07-21 LAB — CBC
HCT: 22.1 % — ABNORMAL LOW (ref 36.0–46.0)
Hemoglobin: 7 g/dL — ABNORMAL LOW (ref 12.0–15.0)
MCH: 27.3 pg (ref 26.0–34.0)
MCHC: 31.7 g/dL (ref 30.0–36.0)
MCV: 86.3 fL (ref 80.0–100.0)
Platelets: 151 10*3/uL (ref 150–400)
RBC: 2.56 MIL/uL — ABNORMAL LOW (ref 3.87–5.11)
RDW: 14.8 % (ref 11.5–15.5)
WBC: 6 10*3/uL (ref 4.0–10.5)
nRBC: 0 % (ref 0.0–0.2)

## 2023-07-21 LAB — GLUCOSE, CAPILLARY
Glucose-Capillary: 164 mg/dL — ABNORMAL HIGH (ref 70–99)
Glucose-Capillary: 109 mg/dL — ABNORMAL HIGH (ref 70–99)
Glucose-Capillary: 220 mg/dL — ABNORMAL HIGH (ref 70–99)
Glucose-Capillary: 147 mg/dL — ABNORMAL HIGH (ref 70–99)

## 2023-07-21 LAB — PHOSPHORUS: Phosphorus: 10.7 mg/dL — ABNORMAL HIGH (ref 2.5–4.6)

## 2023-07-21 MED ORDER — HEPARIN SODIUM (PORCINE) 1000 UNIT/ML DIALYSIS
2500.0000 [IU] | Freq: Once | INTRAMUSCULAR | Status: DC
  Filled 2023-07-21: qty 3

## 2023-07-21 MED ORDER — LIDOCAINE HCL (PF) 1 % IJ SOLN: 5.0000 mL | INTRAMUSCULAR | Status: DC | PRN

## 2023-07-21 MED ORDER — NEPRO/CARBSTEADY PO LIQD: 237.0000 mL | ORAL | Status: DC | PRN

## 2023-07-21 MED ORDER — PENTAFLUOROPROP-TETRAFLUOROETH EX AERO: 1.0000 | INHALATION_SPRAY | CUTANEOUS | Status: DC | PRN

## 2023-07-21 MED ORDER — ANTICOAGULANT SODIUM CITRATE 4% (200MG/5ML) IV SOLN: 5.0000 mL | Status: DC | PRN

## 2023-07-21 MED ORDER — HEPARIN SODIUM (PORCINE) 1000 UNIT/ML IJ SOLN
2500.0000 [IU] | Freq: Once | INTRAMUSCULAR | Status: AC
  Administered 2023-07-21: 2500 [IU]

## 2023-07-21 MED ORDER — LIDOCAINE-PRILOCAINE 2.5-2.5 % EX CREA: 1.0000 | TOPICAL_CREAM | CUTANEOUS | Status: DC | PRN

## 2023-07-21 MED ORDER — HEPARIN SODIUM (PORCINE) 1000 UNIT/ML DIALYSIS: 1000.0000 [IU] | INTRAMUSCULAR | Status: DC | PRN

## 2023-07-21 MED ORDER — ALTEPLASE 2 MG IJ SOLR: 2.0000 mg | Freq: Once | INTRAMUSCULAR | Status: DC | PRN

## 2023-07-21 NOTE — Progress Notes (Signed)
PT Cancellation Note  Patient Details Name: BRENASIA MICKLE MRN: [id number] DOB: 1976-02-02   Cancelled Treatment:    Reason Eval/Treat Not Completed: Patient at procedure or test/unavailable Pt noted to be off the floor at HD at this time. Will f/u as able.  Aleda Grana, PT, DPT 07/21/23, 9:07 AM   [name] 07/21/2023, 9:07 AM

## 2023-07-21 NOTE — Progress Notes (Signed)
0930: patient complained of chest pain described as aching and heavy, 5/10. UF Paused and decreased BFR to 300. Patient requested for oxygen, hooked to 2 LPM via nasal cannula. Dr. Arlean Hopping was notified and ordered stat EKG and continue HD.

## 2023-07-21 NOTE — Plan of Care (Signed)
PT goals entered as late entry. Aleda Grana, PT, DPT 07/21/23, 4:09 PM  Problem: Acute Rehab PT Goals(only PT should resolve) Goal: Pt Will Go Supine/Side To Sit Flowsheets (Taken 07/21/2023 1608) Pt will go Supine/Side to Sit: with modified independence Goal: Pt Will Go Sit To Supine/Side Flowsheets (Taken 07/21/2023 1608) Pt will go Sit to Supine/Side: with modified independence Goal: Pt Will Transfer Bed To Chair/Chair To Bed Flowsheets (Taken 07/21/2023 1608) Pt will Transfer Bed to Chair/Chair to Bed: with supervision Note: With LRAD Goal: Pt Will Ambulate Flowsheets (Taken 07/21/2023 1608) Pt will Ambulate:  50 feet  with supervision  with least restrictive assistive device Goal: Pt/caregiver will Perform Home Exercise Program Flowsheets (Taken 07/21/2023 1608) Pt/caregiver will Perform Home Exercise Program:  For increased strengthening  Independently  For increased ROM

## 2023-07-21 NOTE — Progress Notes (Addendum)
Received patient in bed.Awake,alert and oriented x 4.She signed her HD treatment consent.  Access used: Left upper arm AVF that worked well.  Medicine given: Heparin 2500 units.                           Maalox.                           Oxycodone 10 mg.  Duration of treatment: 3.5 hours.  Fluid removed : Even.  Hemo comment:EKG done at hemo due to complaint of chest pain.                          Sent back into her room via transporter.Not complaining of chest pain.  Hand off to the patient's  nurse.

## 2023-07-21 NOTE — Progress Notes (Signed)
[facility] Progress Note  Subjective: pt seen in HD no c/o's.   Vitals:   07/21/23 1030 07/21/23 1100 07/21/23 1130 07/21/23 1203  BP: (!) 128/58 126/83 113/76 120/62  Pulse: (!) 101 (!) 103 93 94  Resp: 15 14 15 15   Temp:    98 F (36.7 C)  TempSrc:      SpO2: 100% 100% 100% 100%  Weight:      Height:        Exam: Gen alert, no distress No jvd or bruits Chest clear bilat to bases RRR no MRG Abd soft ntnd no mass or ascites +bs MS L knee is wrapped Ext no LE edema Neuro is alert, Ox 3 , nf    RUA AVF          Home meds include - uroxatral, amlodipine-olmesartan 5-20 every day, aspirin, duloxetine, estsradiol, gabapentin 100 hs, insulin degludec/ lispro, lanthanum 1 gm ac tid, leflunomide, megestrol, metoprolol 25 bid, MVI, renavite, percocet prn, rosuvastatin, sevelamer 3200mg  ac tid, prns        OP HD: MWF [facility]  3h  450/1.5    93.1kg   2/2.5 bath  RUA AVF   Heparin 2500 - last OP HD 9/03, post wt 93.1kg   - hectorol 7 mcg IV three times per week - mircera 75 mcg IV q 2 wks, last 8/28, due 9/11       Assessment/ Plan: S/P L TKA - per ortho ESRD - on HD MWF. Had OP HD Monday/ Tuesday earlier this week. Next HD today.  Hypotension - post-op issue, seems to be resolving. Holding home metoprolol and azor (combo norvasc +arb). Resume when BP's stabilized.  Volume - euvolemic on exam.  Doubt today's wt is accurate. Will weigh again.  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 Prob due to acute blood loss. Follow, transfuse prn.   MBD ckd - Ca in range. Follow. Cont binders.  Hyperkalemia - mild 5.2, renal diet and lokelma. HD today.      Vinson Moselle MD  [facility] 07/21/2023, 8:45 AM  Recent Labs  Lab 07/20/23 0711 07/21/23 0944  HGB 8.9* 7.0*  CALCIUM 8.6* 8.0*  CREATININE 10.00* 11.22*  K 5.2* 4.8   No results for input(s): "IRON", "TIBC", "FERRITIN" in the last 168 hours. Inpatient medications:  alfuzosin  10 mg Oral BID AC & HS   apixaban  2.5 mg  Oral Q12H   Chlorhexidine Gluconate Cloth  6 each Topical Q0600   dicyclomine  10 mg Oral TID AC   docusate sodium  100 mg Oral BID   DULoxetine  30 mg Oral Daily   estradiol  1 Applicatorful Vaginal Once per day on Monday Friday   gabapentin  100 mg Oral QHS   [START ON 07/22/2023] heparin  2,500 Units Dialysis Once in dialysis   icosapent Ethyl  2 g Oral BID   insulin aspart  0-15 Units Subcutaneous TID WC   insulin aspart  0-5 Units Subcutaneous QHS   insulin glargine-yfgn  10 Units Subcutaneous Daily   loratadine  10 mg Oral Daily   megestrol  40 mg Oral BID   rosuvastatin  40 mg Oral Daily   senna  1 tablet Oral BID   sevelamer carbonate  3,200 mg Oral TID WC    sodium chloride 50 mL/hr at 07/21/23 0500   anticoagulant sodium citrate     methocarbamol (ROBAXIN) IV     acetaminophen, albuterol, alteplase, alum & mag hydroxide-simeth,  anticoagulant sodium citrate, diphenhydrAMINE, feeding supplement (NEPRO CARB STEADY), heparin, HYDROmorphone (DILAUDID) injection, lidocaine (PF), lidocaine-prilocaine, menthol-cetylpyridinium **OR** phenol, methocarbamol **OR** methocarbamol (ROBAXIN) IV, metoCLOPramide **OR** metoCLOPramide (REGLAN) injection, ondansetron **OR** ondansetron (ZOFRAN) IV, oxyCODONE, oxyCODONE, pentafluoroprop-tetrafluoroeth, polyethylene glycol, promethazine, sevelamer carbonate

## 2023-07-21 NOTE — Progress Notes (Signed)
Pt receives out-pt HD at [facility] on MWF. Will assist as needed.   Tracy Mounce Renal Navigator [phone] 

## 2023-07-21 NOTE — Progress Notes (Signed)
    Subjective:  Patient reports pain as mild to moderate.  Currently denies N/V/CP/SOB/Abd pain. She is currently receiving hemodialysis. She reports that her pain is doing okay. She reports feeling tired today. She is worried that it is difficult to lift her leg today. We discussed fatigue. We went over quad sets today to help activate and strengthen. She is able to activate her quads today.   Objective:   VITALS:   Vitals:   07/21/23 1203 07/21/23 1223 07/21/23 1231 07/21/23 1302  BP: 120/62  121/78 133/65  Pulse: 94  87 (!) 104  Resp: 15  (!) 21   Temp: 98 F (36.7 C)   97.8 F (36.6 C)  TempSrc:    Oral  SpO2: 100%  100% 96%  Weight:  99 kg    Height:        Patient is sitting up in bed. She appears fatigued. Currently receiving hemodialysis. Soft BP today.  Neurologically intact ABD soft Neurovascular intact Sensation intact distally Intact pulses distally Dorsiflexion/Plantar flexion intact No cellulitis present Compartment soft Prevena incisional dressing C/D/I. on house vac, no leaks detected.  She is able to perform quad sets.   Lab Results  Component Value Date   WBC 6.0 07/21/2023   HGB 7.0 (L) 07/21/2023   HCT 22.1 (L) 07/21/2023   MCV 86.3 07/21/2023   PLT 151 07/21/2023   BMET    Component Value Date/Time   NA 129 (L) 07/21/2023 0944   NA 142 12/21/2017 1526   NA 144 09/20/2017 0000   K 4.8 07/21/2023 0944   K 5.4 09/20/2017 0000   CL 95 (L) 07/21/2023 0944   CL 112 09/20/2017 0000   CO2 19 (L) 07/21/2023 0944   CO2 22 09/20/2017 0000   GLUCOSE 174 (H) 07/21/2023 0944   BUN 70 (H) 07/21/2023 0944   BUN 69 (H) 12/21/2017 1526   CREATININE 11.22 (H) 07/21/2023 0944   CREATININE 3.40 (H) 03/27/2017 0759   CALCIUM 8.0 (L) 07/21/2023 0944   CALCIUM 8.6 (L) 11/24/2017 1420   GFRNONAA 4 (L) 07/21/2023 0944   GFRNONAA 9 (L) 09/20/2017 0000   GFRNONAA 65 06/09/2015 1211     Assessment/Plan: 2 Days Post-Op   Principal Problem:    Osteoarthritis of left knee   WBAT with walker DVT ppx:  Eliquis , SCDs, TEDS PO pain control PT/OT: PT has not seen yet today. Continue PT.  Dispo:  - Continue glucose control with goal <200 for optimal wound healing.  -  Prevena incsional dressing intact. Upon discharge nurse to convert house vac to portable prevena unit. Patient to follow-up within 7 days of discharge for removal of dressing.  - Patient currently undergoing hemodialysis. Nephrology following, appreciate recommendations.  - D/c pending PT clearance, nephrology recommendations, and medical stability.   Clois Dupes, PA-C 07/21/2023, 4:26 PM   [facility]  [place] [address], [place], [place] [address] Phone: [phone] [email or web address] Facebook  [employer]

## 2023-07-21 NOTE — Progress Notes (Signed)
Physical Therapy Treatment Patient Details Name: Jane [name] MRN: [id number] DOB: Oct 12, 1976 Today's Date: 07/21/2023   History of Present Illness Patient is 47 y.o. female s/p Lt TKA on 07/19/23 with PMH significant for anxiety, depression, asthma, BD, DM2, neuropathy, ESRD, bil charcot foot, HTN, HLD, hx of NSTEMI.    PT Comments  Pt seen for PT tx with pt agreeable. Pt performed LLE strengthening & ROM exercises with cuing for technique, AAROM PRN. PT educated pt on importance of promoting L knee extension. Pt is able to transfer supine>sit with min assist, STS with min assist but standing limited to <5 seconds 2/2 c/o pain then pt with decreased responsiveness & instructed to lie down. Pt able to transition from sit>supine with min assist then begins verbally talking to PT again so no true moment of unresponsiveness - nurse made aware. Pt did c/o slight dizziness with positional changes (supine>sit) but this improved with time. Recommend ongoing PT treatment to address L knee ROM & strengthening, bed mobility, transfers & gait as able.  BP checked in LUE Supine: 116/53 mmHg MAP 70 Sitting EOB: 84/48 mmHg MAP 59 (c/o dizziness) Sitting EOB at 3 minutes: 96/52 mmHg MAP 66 (reports all symptoms are gone) Pt sat additional 1-2 minutes then attempted standing but due to episode, returned to bed Supine immediately following episode: 89/59 mmHg MAP 64 Semi fowler in bed: 90/49 mmHg MAP 61 Supine in bed: 119/74 mmHg MAP 86 - pt left supine    If plan is discharge home, recommend the following: A lot of help with bathing/dressing/bathroom;Assistance with cooking/housework;Assist for transportation;Help with stairs or ramp for entrance;Supervision due to cognitive status;A lot of help with walking and/or transfers   Can travel by private vehicle        Equipment Recommendations  Rolling walker (2 wheels);BSC/3in1 (shower seat)    Recommendations for Other Services       Precautions /  Restrictions Precautions Precautions: Fall Restrictions Weight Bearing Restrictions: Yes Other Position/Activity Restrictions: WBAT     Mobility  Bed Mobility Overal bed mobility: Needs Assistance Bed Mobility: Supine to Sit, Sit to Supine     Supine to sit: Min assist, Used rails, HOB elevated (assistance to move LLE to EOB) Sit to supine: Min assist, HOB elevated, Used rails   General bed mobility comments: Pt able to scoot to [facility] with bed rails    Transfers Overall transfer level: Needs assistance Equipment used: Rolling walker (2 wheels) Transfers: Sit to/from Stand Sit to Stand: Min assist           General transfer comment: STS from EOB with RW & cuing for hand placement/technique.    Ambulation/Gait                   Stairs             Wheelchair Mobility     Tilt Bed    Modified Rankin (Stroke Patients Only)       Balance Overall balance assessment: Needs assistance Sitting-balance support: Feet supported, Bilateral upper extremity supported Sitting balance-Leahy Scale: Fair     Standing balance support: During functional activity, Bilateral upper extremity supported, Reliant on assistive device for balance Standing balance-Leahy Scale: Poor                              Cognition Arousal: Alert Behavior During Therapy: WFL for tasks assessed/performed Overall Cognitive Status: Within Functional Limits for tasks assessed  Exercises Total Joint Exercises Ankle Circles/Pumps: AROM, Supine, Left, 5 reps Quad Sets: AROM, Supine, Strengthening, Left, 10 reps Short Arc Quad: Supine, AAROM, Strengthening, AROM, Left, 10 reps Heel Slides: AAROM, Left, 10 reps, Supine, Strengthening Straight Leg Raises: AROM, Supine, AAROM, Strengthening, Left, 10 reps Long Arc Quad: AROM, Strengthening, Seated, Left, 10 reps Knee Flexion: AROM, AAROM, Seated, Left, 20 reps     General Comments        Pertinent Vitals/Pain Pain Assessment Pain Assessment: 0-10 Pain Score: 6  Pain Location: L knee with movement Pain Descriptors / Indicators: Discomfort, Grimacing, Guarding Pain Intervention(s): Monitored during session, Limited activity within patient's tolerance, Repositioned    Home Living                          Prior Function            PT Goals (current goals can now be found in the care plan section) Acute Rehab PT Goals Patient Stated Goal: regain strength and independence to return home PT Goal Formulation: With patient Time For Goal Achievement: 07/27/23 Potential to Achieve Goals: Good Progress towards PT goals: Progressing toward goals    Frequency    Min 1X/week      PT Plan      Co-evaluation              AM-PAC PT "6 Clicks" Mobility   Outcome Measure  Help needed turning from your back to your side while in a flat bed without using bedrails?: A Little Help needed moving from lying on your back to sitting on the side of a flat bed without using bedrails?: A Little Help needed moving to and from a bed to a chair (including a wheelchair)?: A Little Help needed standing up from a chair using your arms (e.g., wheelchair or bedside chair)?: A Little Help needed to walk in hospital room?: A Lot Help needed climbing 3-5 steps with a railing? : Total 6 Click Score: 15    End of Session   Activity Tolerance: Treatment limited secondary to medical complications (Comment) Patient left: in bed;with call bell/phone within reach;with bed alarm set Nurse Communication: Mobility status (BP) PT Visit Diagnosis: Muscle weakness (generalized) (M62.81);Difficulty in walking, not elsewhere classified (R26.2);Unsteadiness on feet (R26.81);Other abnormalities of gait and mobility (R26.89);Pain Pain - Right/Left: Left Pain - part of body: Knee     Time: 1610-9604 PT Time Calculation (min) (ACUTE ONLY): 27 min  Charges:     $Therapeutic Exercise: 8-22 mins $Therapeutic Activity: 8-22 mins PT General Charges $$ ACUTE PT VISIT: 1 Visit                     Aleda Grana, PT, DPT 07/21/23, 4:15 PM'   [name] 07/21/2023, 4:12 PM

## 2023-07-22 ENCOUNTER — Inpatient Hospital Stay (HOSPITAL_COMMUNITY): Payer: 59

## 2023-07-22 LAB — GLUCOSE, CAPILLARY
Glucose-Capillary: 179 mg/dL — ABNORMAL HIGH (ref 70–99)
Glucose-Capillary: 134 mg/dL — ABNORMAL HIGH (ref 70–99)
Glucose-Capillary: 137 mg/dL — ABNORMAL HIGH (ref 70–99)
Glucose-Capillary: 210 mg/dL — ABNORMAL HIGH (ref 70–99)

## 2023-07-22 MED ORDER — MIDODRINE HCL 5 MG PO TABS: 10.0000 mg | ORAL_TABLET | Freq: Two times a day (BID) | ORAL | Status: DC

## 2023-07-22 MED ORDER — MIDODRINE HCL 5 MG PO TABS
10.0000 mg | ORAL_TABLET | Freq: Once | ORAL | Status: AC
  Administered 2023-07-22: 10 mg via ORAL
  Filled 2023-07-22: qty 2

## 2023-07-22 MED ORDER — MIDODRINE HCL 5 MG PO TABS
10.0000 mg | ORAL_TABLET | Freq: Two times a day (BID) | ORAL | Status: DC
  Administered 2023-07-22 – 2023-07-23 (×2): 10 mg via ORAL
  Filled 2023-07-22 (×2): qty 2

## 2023-07-22 NOTE — Progress Notes (Addendum)
Physical Therapy Treatment Patient Details Name: Jane [name] MRN: [id number] DOB: [birth year] Today's Date: 07/22/2023   History of Present Illness Patient is 47 y.o. female s/p Lt TKA on 07/19/23 with PMH significant for anxiety, depression, asthma, BD, DM2, neuropathy, ESRD, bil charcot foot, HTN, HLD, hx of NSTEMI.    PT Comments  Pt greeted resting in bed and eager for mobility, however pt continues to be limited by symptomatic orthostatic hypotension. Pt needing grossly min A for bed mobility and transfers sit<>stand, with cues needed for hand placement and optimal LLE placement for pain mitigation. Pt with max c/o dizziness on initial stand with symptoms resolving with seated rest and seated exercises. On second stand pt able to take a few side steps along EOB with cues for sequencing however pt with max dizziness after ~98mins and needing to sit. BP recovering with pt placed trendelenburg and pt asymptomatic at end of session with HOB elevated. Pt continues to benefit from skilled PT services to progress toward functional mobility goals.     If plan is discharge home, recommend the following: A lot of help with bathing/dressing/bathroom;Assistance with cooking/housework;Assist for transportation;Help with stairs or ramp for entrance;Supervision due to cognitive status;A lot of help with walking and/or transfers   Can travel by private vehicle        Equipment Recommendations  Rolling walker (2 wheels);BSC/3in1 (shower seat)    Recommendations for Other Services       Precautions / Restrictions Precautions Precautions: Fall Restrictions Weight Bearing Restrictions: Yes Other Position/Activity Restrictions: WBAT     Mobility  Bed Mobility Overal bed mobility: Needs Assistance Bed Mobility: Supine to Sit, Sit to Supine     Supine to sit: Min assist, Used rails, HOB elevated (assistance to move LLE to EOB) Sit to supine: Min assist, HOB elevated, Used rails   General bed  mobility comments: min A to manage LLE    Transfers Overall transfer level: Needs assistance Equipment used: Rolling walker (2 wheels) Transfers: Sit to/from Stand Sit to Stand: Min assist           General transfer comment: min A to power up to stand with cues for hand placement and assist to achieve full upright standing, on second standing trial pt able to take a few steps along EOB to [facility], limited by dizziness    Ambulation/Gait                   Stairs             Wheelchair Mobility     Tilt Bed    Modified Rankin (Stroke Patients Only)       Balance Overall balance assessment: Needs assistance Sitting-balance support: Feet supported, Bilateral upper extremity supported Sitting balance-Leahy Scale: Fair     Standing balance support: During functional activity, Bilateral upper extremity supported, Reliant on assistive device for balance Standing balance-Leahy Scale: Poor                              Cognition Arousal: Alert Behavior During Therapy: WFL for tasks assessed/performed Overall Cognitive Status: Within Functional Limits for tasks assessed                                 General Comments: pt with good insight into BP deficits and implication with safety and mobility  Exercises Total Joint Exercises Ankle Circles/Pumps: AROM, Supine, Left, 5 reps Quad Sets: AROM, Supine, Strengthening, Left, 10 reps Gluteal Sets: AROM, 10 reps, Supine Heel Slides: Left, 10 reps, Supine, Strengthening, AROM    General Comments General comments (skin integrity, edema, etc.): pt continues to be hypotensive with positional changes, BP not stored, pt syptomatic with sit>stand and unable to progress gait away from EOB for safety      Pertinent Vitals/Pain Pain Assessment Pain Assessment: Faces Faces Pain Scale: Hurts little more Pain Location: L knee with movement Pain Descriptors / Indicators: Discomfort,  Grimacing, Guarding Pain Intervention(s): Monitored during session, Limited activity within patient's tolerance    Home Living                          Prior Function            PT Goals (current goals can now be found in the care plan section) Acute Rehab PT Goals Patient Stated Goal: regain strength and independence to return home PT Goal Formulation: With patient Time For Goal Achievement: 07/27/23 Progress towards PT goals: Not progressing toward goals - comment (orthostatic hypotension)    Frequency    Min 1X/week      PT Plan      Co-evaluation              AM-PAC PT "6 Clicks" Mobility   Outcome Measure  Help needed turning from your back to your side while in a flat bed without using bedrails?: A Little Help needed moving from lying on your back to sitting on the side of a flat bed without using bedrails?: A Little Help needed moving to and from a bed to a chair (including a wheelchair)?: A Little Help needed standing up from a chair using your arms (e.g., wheelchair or bedside chair)?: A Little Help needed to walk in hospital room?: A Lot Help needed climbing 3-5 steps with a railing? : Total 6 Click Score: 15    End of Session   Activity Tolerance: Treatment limited secondary to medical complications (Comment) Patient left: in bed;with call bell/phone within reach;with bed alarm set Nurse Communication: Mobility status (BP) PT Visit Diagnosis: Muscle weakness (generalized) (M62.81);Difficulty in walking, not elsewhere classified (R26.2);Unsteadiness on feet (R26.81);Other abnormalities of gait and mobility (R26.89);Pain Pain - Right/Left: Left Pain - part of body: Knee     Time: 9562-1308 PT Time Calculation (min) (ACUTE ONLY): 33 min  Charges:    $Therapeutic Activity: 23-37 mins PT General Charges $$ ACUTE PT VISIT: 1 Visit                     Irlanda Croghan R. PTA Acute Rehabilitation Services Office: [phone]   Catalina Antigua 07/22/2023, 9:15 AM

## 2023-07-22 NOTE — Progress Notes (Signed)
Subjective: 3 Days Post-Op Procedure(s) (LRB): TOTAL KNEE ARTHROPLASTY (Left) Patient reports pain as moderate.  She has been OOB with PT.  C/o stiffness in the left knee.  Dialysis yesterday.  Objective: Vital signs in last 24 hours: Temp:  [97.8 F (36.6 C)-98.9 F (37.2 C)] 98.9 F (37.2 C) (09/07 0746) Pulse Rate:  [87-106] 98 (09/07 0746) Resp:  [14-21] 21 (09/06 1231) BP: (108-133)/(59-83) 108/59 (09/07 0746) SpO2:  [96 %-100 %] 98 % (09/07 0746) Weight:  [99 kg] 99 kg (09/06 1223)  Intake/Output from previous day: No intake/output data recorded. Intake/Output this shift: No intake/output data recorded.  Recent Labs    07/19/23 1207 07/20/23 0711 07/21/23 0944  HGB 12.9 8.9* 7.0*   Recent Labs    07/20/23 0711 07/21/23 0944  WBC 10.9* 6.0  RBC 3.25* 2.56*  HCT 28.4* 22.1*  PLT 195 151   Recent Labs    07/20/23 0711 07/21/23 0944  NA 131* 129*  K 5.2* 4.8  CL 92* 95*  CO2 21* 19*  BUN 54* 70*  CREATININE 10.00* 11.22*  GLUCOSE 256* 174*  CALCIUM 8.6* 8.0*   No results for input(s): "LABPT", "INR" in the last 72 hours.  PE:  left knee incision dressed and dry with vac in place.  Diminished sens to LT at the left foot c/w neuropathy.  Brisk cap refill at the toes.   Assessment/Plan: 3 Days Post-Op Procedure(s) (LRB): TOTAL KNEE ARTHROPLASTY (Left) Up with therapy  Hold BP per nephrology recs.      Toni Arthurs 07/22/2023, 10:40 AM

## 2023-07-22 NOTE — Progress Notes (Signed)
Physical Therapy Treatment Patient Details Name: Jane [name] MRN: [id number] DOB: Jul 16, 1976 Today's Date: 07/22/2023   History of Present Illness Patient is 47 y.o. female s/p Lt TKA on 07/19/23 with PMH significant for anxiety, depression, asthma, BD, DM2, neuropathy, ESRD, bil charcot foot, HTN, HLD, hx of NSTEMI.    PT Comments  Pt greeted resting in bed and agreeable to PM session with pt able to progress transfers and time up OOB, however pt continues to be limited by dizziness with positional changes, and unable to progress gait. Pt requiring grossly min A for bed mobility and trasnfers with pt becoming dizzy in standing with symptoms resolving once seated. Pt able to step pivot to chair with min A to steady during steps and light assist to manage RW. Pt BP stable seated up in chair at end of session with pt endorsing resolution in symptoms. Pt with good tolerance for seated therex and verbalizing understanding of importance of compliance throughout day. Pt continues to benefit from skilled PT services to progress toward functional mobility goals.     BP readings: Supine 125/57 (75) Sitting EOB 107/58 (73) Standing 93/60 (71) (pt symptomatic) Sitting post exercises 105/56 (69) (endorsing resolution in symptoms) Post trasnfer sitting up in chair 115/52 (70) (mild c/o dizziness during transfer with resolution once seated)    If plan is discharge home, recommend the following: A lot of help with bathing/dressing/bathroom;Assistance with cooking/housework;Assist for transportation;Help with stairs or ramp for entrance;Supervision due to cognitive status;A lot of help with walking and/or transfers   Can travel by private vehicle        Equipment Recommendations  Rolling walker (2 wheels);BSC/3in1 (shower seat)    Recommendations for Other Services       Precautions / Restrictions Precautions Precautions: Fall Restrictions Weight Bearing Restrictions: Yes Other  Position/Activity Restrictions: WBAT     Mobility  Bed Mobility Overal bed mobility: Needs Assistance Bed Mobility: Supine to Sit, Sit to Supine     Supine to sit: Min assist, Used rails, HOB elevated (assistance to move LLE to EOB)   General bed mobility comments: min A to manage LLE    Transfers Overall transfer level: Needs assistance Equipment used: Rolling walker (2 wheels) Transfers: Sit to/from Stand, Bed to chair/wheelchair/BSC Sit to Stand: Min assist   Step pivot transfers: Min assist       General transfer comment: min A to power up to stand with cues for hand placement and assist to achieve full upright standing,min A to steady during trasnfer    Ambulation/Gait                   Stairs             Wheelchair Mobility     Tilt Bed    Modified Rankin (Stroke Patients Only)       Balance Overall balance assessment: Needs assistance Sitting-balance support: Feet supported, Bilateral upper extremity supported Sitting balance-Leahy Scale: Fair     Standing balance support: During functional activity, Bilateral upper extremity supported, Reliant on assistive device for balance Standing balance-Leahy Scale: Poor                              Cognition Arousal: Alert Behavior During Therapy: WFL for tasks assessed/performed Overall Cognitive Status: Within Functional Limits for tasks assessed  General Comments: pt with good insight into BP deficits and implication with safety and mobility        Exercises Total Joint Exercises Ankle Circles/Pumps: AROM, Supine, Left, 5 reps Quad Sets: AROM, Supine, Strengthening, Left, 10 reps Gluteal Sets: AROM, 10 reps, Supine Heel Slides: Left, 10 reps, Supine, Strengthening, AROM Straight Leg Raises: AROM, Left, 10 reps, Seated Long Arc Quad: AROM, AAROM, Left, 10 reps, Seated Knee Flexion: AROM, Left, 10 reps, Seated Other  Exercises Other Exercises: seated marching and BUE chest press for warmup before standing x60 seconds    General Comments General comments (skin integrity, edema, etc.): BP supine 125/57 (75), sitting EOB 107/58 (73), standing 93/60 (71), sitting and post exercises 105/56 (69), post trasnfer up in chair 115/52 (70)      Pertinent Vitals/Pain Pain Assessment Pain Assessment: Faces Faces Pain Scale: Hurts little more Pain Location: L knee with movement Pain Descriptors / Indicators: Discomfort, Grimacing, Guarding Pain Intervention(s): Monitored during session, Limited activity within patient's tolerance    Home Living                          Prior Function            PT Goals (current goals can now be found in the care plan section) Acute Rehab PT Goals Patient Stated Goal: regain strength and independence to return home PT Goal Formulation: With patient Time For Goal Achievement: 07/27/23 Progress towards PT goals: Progressing toward goals    Frequency    Min 1X/week      PT Plan      Co-evaluation              AM-PAC PT "6 Clicks" Mobility   Outcome Measure  Help needed turning from your back to your side while in a flat bed without using bedrails?: A Little Help needed moving from lying on your back to sitting on the side of a flat bed without using bedrails?: A Little Help needed moving to and from a bed to a chair (including a wheelchair)?: A Little Help needed standing up from a chair using your arms (e.g., wheelchair or bedside chair)?: A Little Help needed to walk in hospital room?: A Lot Help needed climbing 3-5 steps with a railing? : Total 6 Click Score: 15    End of Session   Activity Tolerance: Treatment limited secondary to medical complications (Comment);Patient tolerated treatment well Patient left: with call bell/phone within reach;in chair Nurse Communication: Mobility status (BP) PT Visit Diagnosis: Muscle weakness  (generalized) (M62.81);Difficulty in walking, not elsewhere classified (R26.2);Unsteadiness on feet (R26.81);Other abnormalities of gait and mobility (R26.89);Pain Pain - Right/Left: Left Pain - part of body: Knee     Time: 1520-1540 PT Time Calculation (min) (ACUTE ONLY): 20 min  Charges:    $Therapeutic Activity: 8-22 mins PT General Charges $$ ACUTE PT VISIT: 1 Visit                     Elisea Khader R. PTA Acute Rehabilitation Services Office: [phone]   Catalina Antigua 07/22/2023, 3:56 PM

## 2023-07-22 NOTE — Progress Notes (Signed)
[facility] Progress Note  Subjective: pt seen in room. Still having lightheadness w/ standing.   Vitals:   07/21/23 2106 07/22/23 0746 07/22/23 1430 07/22/23 1619  BP: 125/62 (!) 108/59 (!) 95/49 139/64  Pulse: (!) 106 98 93 (!) 108  Resp:      Temp: 98.4 F (36.9 C) 98.9 F (37.2 C) 98.3 F (36.8 C)   TempSrc:  Oral Oral   SpO2: 99% 98% 96% 97%  Weight:      Height:        Exam: Gen alert, no distress No jvd or bruits Chest clear bilat to bases RRR no MRG Abd soft ntnd no mass or ascites +bs MS L knee is wrapped Ext no LE edema Neuro is alert, Ox 3 , nf    RUA AVF     Home meds include - uroxatral, amlodipine-olmesartan 5-20 every day, aspirin, duloxetine, estsradiol, gabapentin 100 hs, insulin degludec/ lispro, lanthanum 1 gm ac tid, leflunomide, megestrol, metoprolol 25 bid, MVI, renavite, percocet prn, rosuvastatin, sevelamer 3200mg  ac tid, prns        OP HD: MWF [facility]  3h  450/1.5    93.1kg   2/2.5 bath  RUA AVF   Heparin 2500 - last OP HD 9/03, post wt 93.1kg   - hectorol 7 mcg IV three times per week - mircera 75 mcg IV q 2 wks, last 8/28, due 9/11       Assessment/ Plan: S/P L TKA - per ortho ESRD - on HD MWF. Had OP HD Monday/ Tuesday earlier this week, then HD Friday here. Next HD Monday.  Hypotension - post-op issue. Holding home metoprolol and azor (combo norvasc +arb). Resume when BP's stabilized.  Volume - euvolemic on exam. Weights may be accurate as she was getting IVF's the last few days. Just turned off. No resp issues. CXR negative. Standing wt would be good. Started her on midodrine for now 10 mg bid so she can work w/ PT.   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 Prob due to acute blood loss. Follow, transfuse prn.   MBD ckd - Ca in range. Follow. Cont binders.  Hyperkalemia - mild 5.2, renal diet and lokelma. HD today.      Vinson Moselle MD  [facility] 07/22/2023, 8:45 AM  Recent Labs  Lab 07/20/23 0711 07/21/23 0500 07/21/23 0944   HGB 8.9*  --  7.0*  CALCIUM 8.6*  --  8.0*  PHOS  --  10.7*  --   CREATININE 10.00*  --  11.22*  K 5.2*  --  4.8   No results for input(s): "IRON", "TIBC", "FERRITIN" in the last 168 hours. Inpatient medications:  alfuzosin  10 mg Oral BID AC & HS   apixaban  2.5 mg Oral Q12H   Chlorhexidine Gluconate Cloth  6 each Topical Q0600   dicyclomine  10 mg Oral TID AC   docusate sodium  100 mg Oral BID   DULoxetine  30 mg Oral Daily   estradiol  1 Applicatorful Vaginal Once per day on Monday Friday   gabapentin  100 mg Oral QHS   icosapent Ethyl  2 g Oral BID   insulin aspart  0-15 Units Subcutaneous TID WC   insulin aspart  0-5 Units Subcutaneous QHS   insulin glargine-yfgn  10 Units Subcutaneous Daily   loratadine  10 mg Oral Daily   megestrol  40 mg Oral BID   midodrine  10 mg Oral BID WC  rosuvastatin  40 mg Oral Daily   senna  1 tablet Oral BID   sevelamer carbonate  3,200 mg Oral TID WC    methocarbamol (ROBAXIN) IV     acetaminophen, albuterol, alum & mag hydroxide-simeth, diphenhydrAMINE, HYDROmorphone (DILAUDID) injection, menthol-cetylpyridinium **OR** phenol, methocarbamol **OR** methocarbamol (ROBAXIN) IV, metoCLOPramide **OR** metoCLOPramide (REGLAN) injection, ondansetron **OR** ondansetron (ZOFRAN) IV, oxyCODONE, oxyCODONE, polyethylene glycol, promethazine, sevelamer carbonate

## 2023-07-22 NOTE — Plan of Care (Signed)
  Problem: Skin Integrity: Goal: Risk for impaired skin integrity will decrease Outcome: Progressing   Problem: Activity: Goal: Ability to avoid complications of mobility impairment will improve Outcome: Progressing Goal: Range of joint motion will improve Outcome: Progressing

## 2023-07-23 ENCOUNTER — Inpatient Hospital Stay (HOSPITAL_COMMUNITY): Payer: 59

## 2023-07-23 DIAGNOSIS — I9589 Other hypotension: Secondary | ICD-10-CM

## 2023-07-23 LAB — GLUCOSE, CAPILLARY
Glucose-Capillary: 215 mg/dL — ABNORMAL HIGH (ref 70–99)
Glucose-Capillary: 203 mg/dL — ABNORMAL HIGH (ref 70–99)
Glucose-Capillary: 134 mg/dL — ABNORMAL HIGH (ref 70–99)

## 2023-07-23 LAB — ECHOCARDIOGRAM COMPLETE
Area-P 1/2: 4.8 cm2
Height: 65 in
S' Lateral: 2.8 cm
Weight: 3308.66 [oz_av]

## 2023-07-23 LAB — CBC
HCT: 21.9 % — ABNORMAL LOW (ref 36.0–46.0)
Hemoglobin: 6.9 g/dL — CL (ref 12.0–15.0)
MCH: 27.3 pg (ref 26.0–34.0)
MCHC: 31.5 g/dL (ref 30.0–36.0)
MCV: 86.6 fL (ref 80.0–100.0)
Platelets: 180 10*3/uL (ref 150–400)
RBC: 2.53 MIL/uL — ABNORMAL LOW (ref 3.87–5.11)
RDW: 14.9 % (ref 11.5–15.5)
WBC: 5.9 10*3/uL (ref 4.0–10.5)
nRBC: 0 % (ref 0.0–0.2)

## 2023-07-23 LAB — PREPARE RBC (CROSSMATCH)

## 2023-07-23 MED ORDER — SODIUM CHLORIDE 0.9% IV SOLUTION: Freq: Once | INTRAVENOUS | Status: AC

## 2023-07-23 MED ORDER — ROSUVASTATIN CALCIUM 5 MG PO TABS
10.0000 mg | ORAL_TABLET | Freq: Every day | ORAL | Status: DC
  Administered 2023-07-23 – 2023-07-26 (×3): 10 mg via ORAL
  Filled 2023-07-23 (×2): qty 2

## 2023-07-23 MED ORDER — CHLORHEXIDINE GLUCONATE CLOTH 2 % EX PADS
6.0000 | MEDICATED_PAD | Freq: Every day | CUTANEOUS | Status: DC
  Administered 2023-07-23 – 2023-07-24 (×2): 6 via TOPICAL

## 2023-07-23 NOTE — Progress Notes (Signed)
PT Cancellation Note  Patient Details Name: Jane [name] MRN: [id number] DOB: [birth year]   Cancelled Treatment:    Reason Eval/Treat Not Completed: Patient at procedure or test/unavailable  Will follow up later today as time allows;  Otherwise, will follow up for PT tomorrow;   Thank you,  Van Clines, PT  Acute Rehabilitation Services Office [phone]    Levi Aland 07/23/2023, 4:06 PM

## 2023-07-23 NOTE — Progress Notes (Signed)
Physical Therapy Treatment Patient Details Name: Jane [name] MRN: [id number] DOB: [birth year] Today's Date: 07/23/2023   History of Present Illness Patient is 47 y.o. female s/p Lt TKA on 07/19/23 with PMH significant for anxiety, depression, asthma, BD, DM2, neuropathy, ESRD, bil charcot foot, HTN, HLD, hx of NSTEMI.    PT Comments  Continuing work on functional mobility and activity tolerance;  Able to slip in and see pt between returning form Vascular and starting blood transfusion; Focused on therapeutic exercise of L knee with very nice participation and knee control; Hopeful that the blood transfusions will help with activity tolerance; Plan for getting more orthostatics and progressive amb next session    If plan is discharge home, recommend the following: A lot of help with bathing/dressing/bathroom;Assistance with cooking/housework;Assist for transportation;Help with stairs or ramp for entrance;Supervision due to cognitive status;A lot of help with walking and/or transfers   Can travel by private vehicle        Equipment Recommendations  Rolling walker (2 wheels);BSC/3in1 (shower seat)    Recommendations for Other Services       Precautions / Restrictions Precautions Precautions: Fall Restrictions Other Position/Activity Restrictions: WBAT     Mobility  Bed Mobility                    Transfers                        Ambulation/Gait                   Stairs             Wheelchair Mobility     Tilt Bed    Modified Rankin (Stroke Patients Only)       Balance                                            Cognition Arousal: Alert Behavior During Therapy: WFL for tasks assessed/performed Overall Cognitive Status: Within Functional Limits for tasks assessed                                 General Comments: pt with good insight into BP deficits and implication with safety and mobility         Exercises Total Joint Exercises Ankle Circles/Pumps: AROM, Supine, 10 reps Quad Sets: AROM, Supine, Strengthening, Left, 10 reps Short Arc Quad: AROM, Supine, Strengthening, 10 reps, Left Heel Slides: Left, 10 reps, Supine, Strengthening, AROM Straight Leg Raises: AROM, Left, 10 reps, Supine    General Comments        Pertinent Vitals/Pain Pain Assessment Pain Assessment: Faces Faces Pain Scale: Hurts little more Pain Location: L knee with movement Pain Descriptors / Indicators: Discomfort, Grimacing, Guarding Pain Intervention(s): Monitored during session    Home Living                          Prior Function            PT Goals (current goals can now be found in the care plan section) Acute Rehab PT Goals Patient Stated Goal: regain strength and independence to return home PT Goal Formulation: With patient Time For Goal Achievement: 07/27/23 Potential to Achieve Goals: Good Progress towards PT goals: Progressing toward  goals    Frequency    Min 1X/week      PT Plan      Co-evaluation              AM-PAC PT "6 Clicks" Mobility   Outcome Measure  Help needed turning from your back to your side while in a flat bed without using bedrails?: A Little Help needed moving from lying on your back to sitting on the side of a flat bed without using bedrails?: A Little Help needed moving to and from a bed to a chair (including a wheelchair)?: A Little Help needed standing up from a chair using your arms (e.g., wheelchair or bedside chair)?: A Little Help needed to walk in hospital room?: A Lot Help needed climbing 3-5 steps with a railing? : Total 6 Click Score: 15    End of Session   Activity Tolerance: Patient tolerated treatment well Patient left: in bed;with call bell/phone within reach Nurse Communication: Mobility status PT Visit Diagnosis: Muscle weakness (generalized) (M62.81);Difficulty in walking, not elsewhere classified  (R26.2);Unsteadiness on feet (R26.81);Other abnormalities of gait and mobility (R26.89);Pain Pain - Right/Left: Left Pain - part of body: Knee     Time: 1610-9604 PT Time Calculation (min) (ACUTE ONLY): 30 min  Charges:    $Therapeutic Exercise: 23-37 mins PT General Charges $$ ACUTE PT VISIT: 1 Visit                     Van Clines, PT  Acute Rehabilitation Services Office [phone] Secure Chat welcomed    Levi Aland 07/23/2023, 6:53 PM

## 2023-07-23 NOTE — Progress Notes (Addendum)
Patient ID: Jane [name], female   DOB: May 09, 1976, 47 y.o.   MRN: [id number] Subjective: 4 Days Post-Op Procedure(s) (LRB): TOTAL KNEE ARTHROPLASTY (Left)    Patient reports pain as mild to moderate but hasn't been able to participate in therapy due to orthostatic related concerns Hasn't been able to stand or walk  Objective:   VITALS:   Vitals:   07/23/23 0504 07/23/23 0731  BP: (!) 155/67 (!) 112/51  Pulse: (!) 111 96  Resp:  18  Temp: 98.4 F (36.9 C) 98.7 F (37.1 C)  SpO2: 100% 92%    Neurovascular intact Incision: dressing C/D/I  LABS Recent Labs    07/21/23 0944 07/23/23 0450  HGB 7.0* 6.9*  HCT 22.1* 21.9*  WBC 6.0 5.9  PLT 151 180    Recent Labs    07/21/23 0944  NA 129*  K 4.8  BUN 70*  CREATININE 11.22*  GLUCOSE 174*    No results for input(s): "LABPT", "INR" in the last 72 hours.   Assessment/Plan: 4 Days Post-Op Procedure(s) (LRB): TOTAL KNEE ARTHROPLASTY (Left)   Advance diet Up with therapy if able  Recommended that she exercise her knee in the bed even if she is unable to get OOB Discharge not likely if she continues to not be able to participate with PT HD tomorrow Discharge dependent on safe mobility to perform ADLs  ABLA - Hgb noted to be 6.9 this morning This could be contributing to her orthostatic issues Will transfuse 2 units of PRBCs  I have asked nursing to hold on administering the blood until Jane [name] could weigh in on the additional fluid as it relates to her ESRD and HD.  She will message him for recommendations on rate and volume to administer

## 2023-07-23 NOTE — Progress Notes (Signed)
  Echocardiogram 2D Echocardiogram has been performed.  Delcie Roch 07/23/2023, 3:53 PM

## 2023-07-23 NOTE — Progress Notes (Signed)
[facility] Progress Note  Subjective: pt seen in room. We stood the patient up, LPN and myself, for a weight and for standing BP.  Standing BP was normal, but she couldn't stand very long as her legs started to buckle and that was not associated with any BP drop.   Vitals:   07/22/23 1619 07/22/23 1934 07/23/23 0504 07/23/23 0731  BP: 139/64 (!) 126/52 (!) 155/67 (!) 112/51  Pulse: (!) 108 (!) 108 (!) 111 96  Resp: 18   18  Temp:  98.2 F (36.8 C) 98.4 F (36.9 C) 98.7 F (37.1 C)  TempSrc:    Oral  SpO2: 97% 98% 100% 92%  Weight:      Height:        Exam: Gen alert, no distress No jvd or bruits Chest clear bilat to bases RRR no MRG Abd soft ntnd no mass or ascites +bs MS L knee is wrapped Ext no LE edema Neuro is alert, Ox 3 , nf    RUA AVF     Home meds include - uroxatral, amlodipine-olmesartan 5-20 every day, aspirin, duloxetine, estsradiol, gabapentin 100 hs, insulin degludec/ lispro, lanthanum 1 gm ac tid, leflunomide, megestrol, metoprolol 25 bid, MVI, renavite, percocet prn, rosuvastatin, sevelamer 3200mg  ac tid, prns        OP HD: MWF [facility]  3h  450/1.5    93.1kg   2/2.5 bath  RUA AVF   Heparin 2500 - last OP HD 9/03, post wt 93.1kg   - hectorol 7 mcg IV three times per week - mircera 75 mcg IV q 2 wks, last 8/28, due 9/11       Assessment/ Plan: S/P L TKA - per ortho.  ESRD - on HD MWF. Had outpatient HD Mon/ Tues last week. Then had HD here on Friday. Next HD Monday.  Hypotension - we stood her up today and standing BP's were not low but she couldn't hold herself up for very long based on deconditioning most likely. We are still holding home metoprolol and azor (combo norvasc +arb). Pharm pointed out she is getting an alpha-blocker (alfuzosin) which can cause orthostatic hypotension and should be used with caution w/ eGFR < 30. Pt said she saw a urologist some time ago for a minor issue. Alfuzosin will be dc'd and she should avoid in the  future. I have discussed this w/ the patient.  Volume - euvolemic on exam. On RA. Will dc midodrine. We got her up for standing wt which was 93.8kg (bed wt saying 99kg) so the bed weights are not reliable. Small UF w/ HD tomorrow.  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 Prob due to acute blood loss. Okay to give 2u prbc's today.  MBD ckd - Ca in range. Phos high at 10. Cont binders.  Hyperkalemia - resolved. Cont renal diet.      Vinson Moselle MD  [facility] 07/23/2023, 8:45 AM  Recent Labs  Lab 07/20/23 0711 07/21/23 0500 07/21/23 0944 07/23/23 0450  HGB 8.9*  --  7.0* 6.9*  CALCIUM 8.6*  --  8.0*  --   PHOS  --  10.7*  --   --   CREATININE 10.00*  --  11.22*  --   K 5.2*  --  4.8  --    No results for input(s): "IRON", "TIBC", "FERRITIN" in the last 168 hours. Inpatient medications:  sodium chloride   Intravenous Once   apixaban  2.5 mg Oral Q12H  Chlorhexidine Gluconate Cloth  6 each Topical Q0600   dicyclomine  10 mg Oral TID AC   docusate sodium  100 mg Oral BID   DULoxetine  30 mg Oral Daily   estradiol  1 Applicatorful Vaginal Once per day on Monday Friday   gabapentin  100 mg Oral QHS   icosapent Ethyl  2 g Oral BID   insulin aspart  0-15 Units Subcutaneous TID WC   insulin aspart  0-5 Units Subcutaneous QHS   insulin glargine-yfgn  10 Units Subcutaneous Daily   loratadine  10 mg Oral Daily   megestrol  40 mg Oral BID   midodrine  10 mg Oral BID WC   rosuvastatin  10 mg Oral Daily   senna  1 tablet Oral BID   sevelamer carbonate  3,200 mg Oral TID WC    methocarbamol (ROBAXIN) IV     acetaminophen, albuterol, alum & mag hydroxide-simeth, diphenhydrAMINE, HYDROmorphone (DILAUDID) injection, menthol-cetylpyridinium **OR** phenol, methocarbamol **OR** methocarbamol (ROBAXIN) IV, metoCLOPramide **OR** metoCLOPramide (REGLAN) injection, ondansetron **OR** ondansetron (ZOFRAN) IV, oxyCODONE, oxyCODONE, polyethylene glycol, promethazine, sevelamer carbonate

## 2023-07-24 LAB — RENAL FUNCTION PANEL
Albumin: 2.3 g/dL — ABNORMAL LOW (ref 3.5–5.0)
Anion gap: 15 (ref 5–15)
BUN: 66 mg/dL — ABNORMAL HIGH (ref 6–20)
CO2: 21 mmol/L — ABNORMAL LOW (ref 22–32)
Calcium: 8.3 mg/dL — ABNORMAL LOW (ref 8.9–10.3)
Chloride: 90 mmol/L — ABNORMAL LOW (ref 98–111)
Creatinine, Ser: 12.18 mg/dL — ABNORMAL HIGH (ref 0.44–1.00)
GFR, Estimated: 3 mL/min — ABNORMAL LOW (ref >60)
Glucose, Bld: 124 mg/dL — ABNORMAL HIGH (ref 70–99)
Phosphorus: 8.2 mg/dL — ABNORMAL HIGH (ref 2.5–4.6)
Potassium: 4.4 mmol/L (ref 3.5–5.1)
Sodium: 126 mmol/L — ABNORMAL LOW (ref 135–145)

## 2023-07-24 LAB — CBC
HCT: 22.7 % — ABNORMAL LOW (ref 36.0–46.0)
Hemoglobin: 7.2 g/dL — ABNORMAL LOW (ref 12.0–15.0)
MCH: 27.2 pg (ref 26.0–34.0)
MCHC: 31.7 g/dL (ref 30.0–36.0)
MCV: 85.7 fL (ref 80.0–100.0)
Platelets: 214 10*3/uL (ref 150–400)
RBC: 2.65 MIL/uL — ABNORMAL LOW (ref 3.87–5.11)
RDW: 14.6 % (ref 11.5–15.5)
WBC: 6.1 10*3/uL (ref 4.0–10.5)
nRBC: 0 % (ref 0.0–0.2)

## 2023-07-24 LAB — GLUCOSE, CAPILLARY
Glucose-Capillary: 221 mg/dL — ABNORMAL HIGH (ref 70–99)
Glucose-Capillary: 127 mg/dL — ABNORMAL HIGH (ref 70–99)

## 2023-07-24 MED ORDER — HEPARIN SODIUM (PORCINE) 1000 UNIT/ML DIALYSIS: 1000.0000 [IU] | INTRAMUSCULAR | Status: DC | PRN

## 2023-07-24 MED ORDER — LIDOCAINE-PRILOCAINE 2.5-2.5 % EX CREA: 1.0000 | TOPICAL_CREAM | CUTANEOUS | Status: DC | PRN

## 2023-07-24 MED ORDER — DARBEPOETIN ALFA 200 MCG/0.4ML IJ SOSY
200.0000 ug | PREFILLED_SYRINGE | INTRAMUSCULAR | Status: DC
  Administered 2023-07-24: 200 ug via SUBCUTANEOUS
  Filled 2023-07-24: qty 0.4

## 2023-07-24 MED ORDER — HEPARIN SODIUM (PORCINE) 1000 UNIT/ML DIALYSIS
2000.0000 [IU] | Freq: Once | INTRAMUSCULAR | Status: AC
  Administered 2023-07-24: 2000 [IU] via INTRAVENOUS_CENTRAL
  Filled 2023-07-24: qty 2

## 2023-07-24 MED ORDER — PENTAFLUOROPROP-TETRAFLUOROETH EX AERO: 1.0000 | INHALATION_SPRAY | CUTANEOUS | Status: DC | PRN

## 2023-07-24 MED ORDER — ANTICOAGULANT SODIUM CITRATE 4% (200MG/5ML) IV SOLN: 5.0000 mL | Status: DC | PRN

## 2023-07-24 MED ORDER — ALTEPLASE 2 MG IJ SOLR: 2.0000 mg | Freq: Once | INTRAMUSCULAR | Status: DC | PRN

## 2023-07-24 MED ORDER — LIDOCAINE HCL (PF) 1 % IJ SOLN: 5.0000 mL | INTRAMUSCULAR | Status: DC | PRN

## 2023-07-24 MED ORDER — NEPRO/CARBSTEADY PO LIQD: 237.0000 mL | ORAL | Status: DC | PRN

## 2023-07-24 NOTE — Progress Notes (Signed)
    Subjective:  Patient reports pain as mild to moderate.  Denies N/V/CP/SOB/Abd pain. She is currently receiving hemodialysis. She reports she is feeling better today. She has been doing exercises in the bed.  She received 2 untis PRBCs yesterday. Was not able to ambulated with PT due to procedures and blood transfusion.   Objective:   VITALS:   Vitals:   07/24/23 1030 07/24/23 1100 07/24/23 1131 07/24/23 1133  BP: (!) 154/65 (!) 164/69 (!) 153/73 (!) 159/71  Pulse: (!) 102 (!) 101 99 98  Resp: 20 14 13  (!) 23  Temp:   98.4 F (36.9 C)   TempSrc:   Oral   SpO2: 99% 98% 98% 100%  Weight:      Height:        Patient sitting up in bed. NAD. On RA today.  Neurologically intact ABD soft Neurovascular intact Sensation intact distally Intact pulses distally Dorsiflexion/Plantar flexion intact No cellulitis present Compartment soft Prevena negative pressure incisional dressing. C/D/I, no leaks detected.   Lab Results  Component Value Date   WBC 6.1 07/24/2023   HGB 7.2 (L) 07/24/2023   HCT 22.7 (L) 07/24/2023   MCV 85.7 07/24/2023   PLT 214 07/24/2023   BMET    Component Value Date/Time   NA 126 (L) 07/24/2023 0750   NA 142 12/21/2017 1526   NA 144 09/20/2017 0000   K 4.4 07/24/2023 0750   K 5.4 09/20/2017 0000   CL 90 (L) 07/24/2023 0750   CL 112 09/20/2017 0000   CO2 21 (L) 07/24/2023 0750   CO2 22 09/20/2017 0000   GLUCOSE 124 (H) 07/24/2023 0750   BUN 66 (H) 07/24/2023 0750   BUN 69 (H) 12/21/2017 1526   CREATININE 12.18 (H) 07/24/2023 0750   CREATININE 3.40 (H) 03/27/2017 0759   CALCIUM 8.3 (L) 07/24/2023 0750   CALCIUM 8.6 (L) 11/24/2017 1420   GFRNONAA 3 (L) 07/24/2023 0750   GFRNONAA 9 (L) 09/20/2017 0000   GFRNONAA 65 06/09/2015 1211     Assessment/Plan: 5 Days Post-Op   Principal Problem:   Osteoarthritis of left knee  ABLA. Hemoglobin 7.2 (6.9 yesterday) after 2 units PRBCs. Continue to monitor.   WBAT with walker DVT ppx:   Eliquis , SCDs, TEDS PO pain control PT/OT: Continue to work with ambulation with PT and exercises for quad strengthening and ROM.  Dispo:  - Continue glucose control with goal <200 for optimal wound healing.  -  Prevena incsional dressing intact. Upon discharge nurse to convert house vac to portable prevena unit. Patient to follow-up within 7 days of discharge for removal of dressing.  - Patient currently undergoing hemodialysis. Nephrology following, appreciate recommendations.  - D/c pending PT clearance, nephrology recommendations, and medical stability.    Clois Dupes, PA-C 07/24/2023, 11:44 AM   [facility]  [place] [address], [place], [place] [address] Phone: [phone] [email or web address] Facebook  [employer]

## 2023-07-24 NOTE — Progress Notes (Signed)
Received patient in bed to unit.  Alert and oriented.  Informed consent signed and in chart.   TX duration:3.5  Patient tolerated well.  Transported back to the room  Alert, without acute distress.  Hand-off given to patient's nurse.   Access used: right AVF Access issues: none  Total UF removed: 2L Medication(s) given: none   07/24/23 1131  Vitals  Temp 98.4 F (36.9 C)  Temp Source Oral  BP (!) 153/73  MAP (mmHg) 96  BP Location Left Arm  BP Method Automatic  Patient Position (if appropriate) Lying  Pulse Rate 99  Pulse Rate Source Monitor  ECG Heart Rate 99  Resp 13  Oxygen Therapy  SpO2 98 %  O2 Device Room Air  During Treatment Monitoring  Intra-Hemodialysis Comments Tx completed;Tolerated well  Dialysis Fluid Bolus Normal Saline  Bolus Amount (mL) 300 mL      [name]

## 2023-07-24 NOTE — Procedures (Signed)
I was present at this dialysis session. I have reviewed the session itself and made appropriate changes.   Rec PRBC yesterday Hb 7.2 from 6.9.  BP overall seem ok.  Holding BP meds currently  Due for ESA 9/11, go ahead and give darbe 200 today.    K 4.4 on 2 K bath. UF goal or 2L  SNa  with progressive hyponatremia likely excess free water, HD will address.    BPs stable here today.  Not yet walked with PT, but did work with them yesterday.    Next HD cont on MWF Schedule.     Filed Weights   07/21/23 1223 07/23/23 1100 07/24/23 0737  Weight: 99 kg 93.8 kg 103.1 kg    Recent Labs  Lab 07/24/23 0750  NA 126*  K 4.4  CL 90*  CO2 21*  GLUCOSE 124*  BUN 66*  CREATININE 12.18*  CALCIUM 8.3*  PHOS 8.2*    Recent Labs  Lab 07/21/23 0944 07/23/23 0450 07/24/23 0750  WBC 6.0 5.9 6.1  HGB 7.0* 6.9* 7.2*  HCT 22.1* 21.9* 22.7*  MCV 86.3 86.6 85.7  PLT 151 180 214    Scheduled Meds:  apixaban  2.5 mg Oral Q12H   Chlorhexidine Gluconate Cloth  6 each Topical Q0600   Chlorhexidine Gluconate Cloth  6 each Topical Q0600   dicyclomine  10 mg Oral TID AC   docusate sodium  100 mg Oral BID   DULoxetine  30 mg Oral Daily   estradiol  1 Applicatorful Vaginal Once per day on Monday Friday   gabapentin  100 mg Oral QHS   icosapent Ethyl  2 g Oral BID   insulin aspart  0-15 Units Subcutaneous TID WC   insulin aspart  0-5 Units Subcutaneous QHS   insulin glargine-yfgn  10 Units Subcutaneous Daily   loratadine  10 mg Oral Daily   megestrol  40 mg Oral BID   rosuvastatin  10 mg Oral Daily   senna  1 tablet Oral BID   sevelamer carbonate  3,200 mg Oral TID WC   Continuous Infusions:  anticoagulant sodium citrate     methocarbamol (ROBAXIN) IV     PRN Meds:.acetaminophen, albuterol, alteplase, alum & mag hydroxide-simeth, anticoagulant sodium citrate, diphenhydrAMINE, feeding supplement (NEPRO CARB STEADY), heparin, HYDROmorphone (DILAUDID) injection, lidocaine (PF),  lidocaine-prilocaine, menthol-cetylpyridinium **OR** phenol, methocarbamol **OR** methocarbamol (ROBAXIN) IV, metoCLOPramide **OR** metoCLOPramide (REGLAN) injection, ondansetron **OR** ondansetron (ZOFRAN) IV, oxyCODONE, oxyCODONE, pentafluoroprop-tetrafluoroeth, polyethylene glycol, promethazine, sevelamer carbonate   [name] Heck  MD 07/24/2023, 9:33 AM

## 2023-07-24 NOTE — Progress Notes (Signed)
Pt returned to 5N03 via bed after dialysis. Received report from [place], Charity fundraiser. See assessment.

## 2023-07-24 NOTE — Progress Notes (Signed)
Physical Therapy Treatment Patient Details Name: Jane [name] MRN: [id number] DOB: 1976-04-20 Today's Date: 07/24/2023   History of Present Illness Patient is 47 y.o. female s/p Lt TKA on 07/19/23 with PMH significant for anxiety, depression, asthma, BD, DM2, neuropathy, ESRD, bil charcot foot, HTN, HLD, hx of NSTEMI.    PT Comments  Continuing work on functional mobility and activity tolerance;  Session focused on spending more time in standing, pushing activity tolerance, getting BP response to incr time in standing, and first bout with gait; L knee painful, but pt VERY motivated to work; Nice, stable L knee in stance;   Notable BP drop between supine, sitting, and standing -- esp with standing at 3 minutes, pt was symptomatic for dizziness, and noted incr sway in standing; Pt reports she is curious if she was supposed to get midodrine immediately after HD today;   Pt is making slow progress with upright activity tolerance, and was able to push herself and do her first concentrated bout with amb despite feel ing dizzy (made sure recliner was close for safety); I'm hopeful that she will continue to progress from today, and be able to dc home with [facility] therapies; Given slow progress and goal for getting home, will place an order for OT for ADLs    If plan is discharge home, recommend the following: A lot of help with bathing/dressing/bathroom;Assistance with cooking/housework;Assist for transportation;Help with stairs or ramp for entrance;Supervision due to cognitive status;A lot of help with walking and/or transfers   Can travel by private vehicle        Equipment Recommendations  Rolling walker (2 wheels);BSC/3in1 (shower seat)    Recommendations for Other Services OT consult (will order per protocol)     Precautions / Restrictions Precautions Precautions: Fall Precaution Comments: watch BPs and for presyncopal symptoms Restrictions Other Position/Activity Restrictions: WBAT      Mobility  Bed Mobility Overal bed mobility: Needs Assistance Bed Mobility: Supine to Sit     Supine to sit: Supervision     General bed mobility comments: incr time    Transfers Overall transfer level: Needs assistance Equipment used: Rolling walker (2 wheels) Transfers: Sit to/from Stand, Bed to chair/wheelchair/BSC Sit to Stand: Min assist   Step pivot transfers: Contact guard assist       General transfer comment: Min assist to steady RW during rise; close guard for safety with pivot steps; noting nice, stable knee in stance    Ambulation/Gait Ambulation/Gait assistance: Contact guard assist (and chair pulled behind) Gait Distance (Feet): 6 Feet Assistive device: Rolling walker (2 wheels) Gait Pattern/deviations: Step-through pattern (emerging)       General Gait Details: Cues for gait sequence,a dn to focus on quad activation during L stance; no knee buckling noted   Stairs             Wheelchair Mobility     Tilt Bed    Modified Rankin (Stroke Patients Only)       Balance     Sitting balance-Leahy Scale: Fair       Standing balance-Leahy Scale: Poor Standing balance comment: Noting incr sway in static standing with incr time in standing                            Cognition Arousal: Alert Behavior During Therapy: WFL for tasks assessed/performed Overall Cognitive Status: Within Functional Limits for tasks assessed  General Comments: pt with good insight into BP deficits and implication with safety and mobility        Exercises      General Comments General comments (skin integrity, edema, etc.): See other PT note from today for serial BPs      Pertinent Vitals/Pain Pain Assessment Pain Assessment: Faces Faces Pain Scale: Hurts even more Pain Location: L knee with movement Pain Descriptors / Indicators: Discomfort, Grimacing, Guarding Pain Intervention(s): Monitored  during session    Home Living                          Prior Function            PT Goals (current goals can now be found in the care plan section) Acute Rehab PT Goals Patient Stated Goal: regain strength and independence to return home PT Goal Formulation: With patient Time For Goal Achievement: 07/27/23 Potential to Achieve Goals: Good Progress towards PT goals: Progressing toward goals    Frequency    Min 1X/week      PT Plan      Co-evaluation              AM-PAC PT "6 Clicks" Mobility   Outcome Measure  Help needed turning from your back to your side while in a flat bed without using bedrails?: None Help needed moving from lying on your back to sitting on the side of a flat bed without using bedrails?: A Little Help needed moving to and from a bed to a chair (including a wheelchair)?: A Little Help needed standing up from a chair using your arms (e.g., wheelchair or bedside chair)?: A Little Help needed to walk in hospital room?: A Little Help needed climbing 3-5 steps with a railing? : A Lot 6 Click Score: 18    End of Session Equipment Utilized During Treatment: Gait belt Activity Tolerance: Patient tolerated treatment well Patient left: in chair;with call bell/phone within reach Nurse Communication: Mobility status PT Visit Diagnosis: Muscle weakness (generalized) (M62.81);Difficulty in walking, not elsewhere classified (R26.2);Unsteadiness on feet (R26.81);Other abnormalities of gait and mobility (R26.89);Pain Pain - Right/Left: Left Pain - part of body: Knee     Time: 2130-8657 PT Time Calculation (min) (ACUTE ONLY): 34 min  Charges:    $Gait Training: 8-22 mins $Therapeutic Activity: 8-22 mins PT General Charges $$ ACUTE PT VISIT: 1 Visit                     Van Clines, PT  Acute Rehabilitation Services Office [phone] Secure Chat welcomed    Jane [name] 07/24/2023, 2:47 PM

## 2023-07-24 NOTE — Plan of Care (Signed)
  Problem: Pain Managment: Goal: General experience of comfort will improve Outcome: Progressing   Problem: Safety: Goal: Ability to remain free from injury will improve Outcome: Progressing   Problem: Skin Integrity: Goal: Risk for impaired skin integrity will decrease Outcome: Progressing   

## 2023-07-24 NOTE — Progress Notes (Signed)
Physical Therapy Note  (Full note to follow)  Seen post HD, with serial vitals as follows:    07/24/23 1330  Vital Signs  Patient Position (if appropriate) Orthostatic Vitals  Orthostatic Lying   BP- Lying 181/77 (MAP 102)  Pulse- Lying 105  Orthostatic Sitting  BP- Sitting 133/62 (MAP 81)  Pulse- Sitting 103  Orthostatic Standing at 0 minutes  BP- Standing at 0 minutes 99/52 (MAP 67)  Dizziness reported  Pulse- Standing at 0 minutes 105  Orthostatic Standing at 3 minutes  BP- Standing at 3 minutes 99/50 (MAP 64)  Dizziness reported, and incr sway in standing  Pulse- Standing at 3 minutes 107   Van Clines, PT  Acute Rehabilitation Services Office [phone] Secure Chat welcomed

## 2023-07-25 LAB — RENAL FUNCTION PANEL
Albumin: 2.5 g/dL — ABNORMAL LOW (ref 3.5–5.0)
Anion gap: 16 — ABNORMAL HIGH (ref 5–15)
BUN: 52 mg/dL — ABNORMAL HIGH (ref 6–20)
CO2: 24 mmol/L (ref 22–32)
Calcium: 9.1 mg/dL (ref 8.9–10.3)
Chloride: 88 mmol/L — ABNORMAL LOW (ref 98–111)
Creatinine, Ser: 9.76 mg/dL — ABNORMAL HIGH (ref 0.44–1.00)
GFR, Estimated: 5 mL/min — ABNORMAL LOW (ref >60)
Glucose, Bld: 183 mg/dL — ABNORMAL HIGH (ref 70–99)
Phosphorus: 5.8 mg/dL — ABNORMAL HIGH (ref 2.5–4.6)
Potassium: 4.8 mmol/L (ref 3.5–5.1)
Sodium: 128 mmol/L — ABNORMAL LOW (ref 135–145)

## 2023-07-25 LAB — CBC
HCT: 26.3 % — ABNORMAL LOW (ref 36.0–46.0)
Hemoglobin: 8.2 g/dL — ABNORMAL LOW (ref 12.0–15.0)
MCH: 27.3 pg (ref 26.0–34.0)
MCHC: 31.2 g/dL (ref 30.0–36.0)
MCV: 87.7 fL (ref 80.0–100.0)
Platelets: 279 10*3/uL (ref 150–400)
RBC: 3 MIL/uL — ABNORMAL LOW (ref 3.87–5.11)
RDW: 14.5 % (ref 11.5–15.5)
WBC: 6 10*3/uL (ref 4.0–10.5)
nRBC: 0 % (ref 0.0–0.2)

## 2023-07-25 LAB — GLUCOSE, CAPILLARY
Glucose-Capillary: 200 mg/dL — ABNORMAL HIGH (ref 70–99)
Glucose-Capillary: 90 mg/dL (ref 70–99)
Glucose-Capillary: 177 mg/dL — ABNORMAL HIGH (ref 70–99)
Glucose-Capillary: 113 mg/dL — ABNORMAL HIGH (ref 70–99)

## 2023-07-25 MED ORDER — HEPARIN SODIUM (PORCINE) 1000 UNIT/ML DIALYSIS: 1000.0000 [IU] | INTRAMUSCULAR | Status: DC | PRN

## 2023-07-25 MED ORDER — ALTEPLASE 2 MG IJ SOLR: 2.0000 mg | Freq: Once | INTRAMUSCULAR | Status: DC | PRN

## 2023-07-25 MED ORDER — LIDOCAINE HCL (PF) 1 % IJ SOLN: 5.0000 mL | INTRAMUSCULAR | Status: DC | PRN

## 2023-07-25 MED ORDER — LIDOCAINE-PRILOCAINE 2.5-2.5 % EX CREA: 1.0000 | TOPICAL_CREAM | CUTANEOUS | Status: DC | PRN

## 2023-07-25 MED ORDER — ANTICOAGULANT SODIUM CITRATE 4% (200MG/5ML) IV SOLN: 5.0000 mL | Status: DC | PRN

## 2023-07-25 MED ORDER — CHLORHEXIDINE GLUCONATE CLOTH 2 % EX PADS
6.0000 | MEDICATED_PAD | Freq: Every day | CUTANEOUS | Status: DC
  Administered 2023-07-26: 6 via TOPICAL

## 2023-07-25 MED ORDER — PENTAFLUOROPROP-TETRAFLUOROETH EX AERO: 1.0000 | INHALATION_SPRAY | CUTANEOUS | Status: DC | PRN

## 2023-07-25 NOTE — Progress Notes (Signed)
Physical Therapy Note  Serial BPs during session as follows:    07/25/23 0900  Orthostatic Lying   BP- Lying 167/72 (MAP 97)  Pulse- Lying 101  Orthostatic Sitting  BP- Sitting 165/73 (MAP 99)  Pulse- Sitting 99  Orthostatic Standing at 0 minutes  BP- Standing at 0 minutes 144/87 (MAP 87)  Pulse- Standing at 0 minutes 99  Orthostatic Standing at 3 minutes  BP- Standing at 3 minutes 120/56 (MAP 75)  Pulse- Standing at 3 minutes 96 (Post in hallway ambulation; no reports of dizziness and no observed sway)   Much improved compared to yesterday's session;  (Full PT session note to follow)  Van Clines, PT  Acute Rehabilitation Services Office [phone] Secure Chat welcomed

## 2023-07-25 NOTE — Care Management Important Message (Signed)
Important Message  Patient Details  Name: Jane [name] MRN: [id number] Date of Birth: [birth year]   Medicare Important Message Given:  Yes     Sherilyn Banker 07/25/2023, 12:36 PM

## 2023-07-25 NOTE — Progress Notes (Signed)
    Subjective:  Patient reports pain as mild to moderate.  Denies N/V/CP/SOB/Abd pain. She denies any tingling or numbness in LE bilaterally. She states she did better with PT yesterday but limited by BP dropping. She reports she has been doing her exercises in bed. She is feeling good today.   Objective:   VITALS:   Vitals:   07/24/23 1425 07/24/23 1923 07/25/23 0340 07/25/23 0716  BP:  125/64 129/66 (!) 140/64  Pulse:  93 91 95  Resp:  18 18 16   Temp:  99.1 F (37.3 C) 98.8 F (37.1 C) 100.2 F (37.9 C)  TempSrc:  Oral Oral Oral  SpO2: 100% 98% 91% 98%  Weight:      Height:        Patient sitting up in bed. NAD. On RA today.  Neurologically intact ABD soft Neurovascular intact Sensation intact distally Intact pulses distally Dorsiflexion/Plantar flexion intact No cellulitis present Compartment soft Prevena negative pressure incisional dressing. C/D/I, no leaks detected.  Patient can perform straight leg raise, no extensor lag.  Lab Results  Component Value Date   WBC 6.1 07/24/2023   HGB 7.2 (L) 07/24/2023   HCT 22.7 (L) 07/24/2023   MCV 85.7 07/24/2023   PLT 214 07/24/2023   BMET    Component Value Date/Time   NA 126 (L) 07/24/2023 0750   NA 142 12/21/2017 1526   NA 144 09/20/2017 0000   K 4.4 07/24/2023 0750   K 5.4 09/20/2017 0000   CL 90 (L) 07/24/2023 0750   CL 112 09/20/2017 0000   CO2 21 (L) 07/24/2023 0750   CO2 22 09/20/2017 0000   GLUCOSE 124 (H) 07/24/2023 0750   BUN 66 (H) 07/24/2023 0750   BUN 69 (H) 12/21/2017 1526   CREATININE 12.18 (H) 07/24/2023 0750   CREATININE 3.40 (H) 03/27/2017 0759   CALCIUM 8.3 (L) 07/24/2023 0750   CALCIUM 8.6 (L) 11/24/2017 1420   GFRNONAA 3 (L) 07/24/2023 0750   GFRNONAA 9 (L) 09/20/2017 0000   GFRNONAA 65 06/09/2015 1211     Assessment/Plan: 6 Days Post-Op   Principal Problem:   Osteoarthritis of left knee   WBAT with walker DVT ppx:  Eliquis , SCDs, TEDS PO pain control PT/OT: Slow  progression with PT due to orthostatic hypotension. She ambulated 6 feet with PT yesterday continue PT today work with ambulation with PT and exercises for quad strengthening and ROM.   Dispo:  - Continue glucose control with goal <200 for optimal wound healing.  -  Prevena incsional dressing intact. Upon discharge nurse to convert house vac to portable prevena unit. Patient to follow-up within 7 days of discharge for removal of dressing.  - Patient currently undergoing hemodialysis. Nephrology following, appreciate recommendations.  - D/c pending PT clearance, nephrology recommendations, and medical stability.    Clois Dupes, PA-C 07/25/2023, 7:50 AM   [facility]  [place] [address], [address], [place], [place] [address] Phone: [phone] [email or web address] Facebook  [employer]

## 2023-07-25 NOTE — Evaluation (Signed)
Occupational Therapy Evaluation Patient Details Name: Jane [name] MRN: [id number] DOB: 05-Apr-1976 Today's Date: 07/25/2023   History of Present Illness Patient is 47 y.o. female s/p Lt TKA on 07/19/23 with PMH significant for anxiety, depression, asthma, BD, DM2, neuropathy, ESRD, bil charcot foot, HTN, HLD, hx of NSTEMI.   Clinical Impression   Pt in good spirits, 6/10 pain at rest, no increased pain with activity. Pt lives alone, 2 story apartment, 12 steps to bed/bath, PCA 5X/wk to assist with IADLs at baseline, has family available upon DC. Pt currently requires min A for LB ADLs, supervision for mobility, planned for OPPT to follow up. Pt would benefit from acute OT to maximize independence and instruct on compensatory strategies prior to DC home, no OT follow up needed. Pt would benefit from RW, BSC, and tub bench to maximize safety in home, will have PCA and family available as needed to remain safe in home environment.       If plan is discharge home, recommend the following: A little help with walking and/or transfers;A little help with bathing/dressing/bathroom;Assistance with cooking/housework;Help with stairs or ramp for entrance;Assist for transportation    Functional Status Assessment  Patient has had a recent decline in their functional status and demonstrates the ability to make significant improvements in function in a reasonable and predictable amount of time.  Equipment Recommendations  BSC/3in1;Tub/shower bench;Other (comment) (RW)    Recommendations for Other Services       Precautions / Restrictions Precautions Precautions: Fall Precaution Comments: watch BPs and for presyncopal symptoms Restrictions Weight Bearing Restrictions: No Other Position/Activity Restrictions: WBAT      Mobility Bed Mobility Overal bed mobility: Modified Independent                  Transfers Overall transfer level: Needs assistance Equipment used: Rolling walker (2  wheels) Transfers: Sit to/from Stand, Bed to chair/wheelchair/BSC Sit to Stand: Supervision     Step pivot transfers: Supervision     General transfer comment: supervision      Balance Overall balance assessment: Needs assistance Sitting-balance support: No upper extremity supported, Feet supported Sitting balance-Leahy Scale: Good Sitting balance - Comments: EOB ADLs   Standing balance support: During functional activity, Reliant on assistive device for balance Standing balance-Leahy Scale: Poor Standing balance comment: one instance of LB at sink, able to recover, able to stand and wash hands/groom supervision at sink                           ADL either performed or assessed with clinical judgement   ADL Overall ADL's : Needs assistance/impaired Eating/Feeding: Independent   Grooming: Contact guard assist;Standing   Upper Body Bathing: Set up;Sitting   Lower Body Bathing: Minimal assistance;Sitting/lateral leans   Upper Body Dressing : Set up;Sitting   Lower Body Dressing: Minimal assistance;Sit to/from stand   Toilet Transfer: Supervision/safety;Ambulation;Regular Toilet;Rolling walker (2 wheels)   Toileting- Clothing Manipulation and Hygiene: Modified independent       Functional mobility during ADLs: Supervision/safety General ADL Comments: Pt requires min A for LLE socks/shoes, supervision for transfers, able to perform stanidng ADLs using RW     Vision Baseline Vision/History: 0 No visual deficits Ability to See in Adequate Light: 0 Adequate Patient Visual Report: No change from baseline       Perception         Praxis         Pertinent Vitals/Pain Pain Assessment Pain Assessment:  0-10 Pain Score: 6  Pain Location: L knee at rest Pain Descriptors / Indicators: Discomfort, Grimacing, Guarding Pain Intervention(s): Monitored during session     Extremity/Trunk Assessment Upper Extremity Assessment Upper Extremity Assessment:  Generalized weakness   Lower Extremity Assessment Lower Extremity Assessment: Defer to PT evaluation       Communication Communication Communication: Hearing impairment   Cognition Arousal: Alert Behavior During Therapy: WFL for tasks assessed/performed Overall Cognitive Status: Within Functional Limits for tasks assessed                                       General Comments  See other PT note form today for serial BPs    Exercises     Shoulder Instructions      Home Living Family/patient expects to be discharged to:: Private residence Living Arrangements: Alone Available Help at Discharge: Family;Available PRN/intermittently Type of Home: House Home Access: Stairs to enter [employer] of Steps: 1 Entrance Stairs-Rails: None Home Layout: Two level;Bed/bath upstairs Alternate Level Stairs-Number of Steps: 12 Alternate Level Stairs-Rails: Right Bathroom Shower/Tub: Chief Strategy Officer: Standard Bathroom Accessibility: Yes How Accessible: Accessible via walker Home Equipment: None   Additional Comments: Pt lives alone, family can assist as needed.      Prior Functioning/Environment Prior Level of Function : Needs assist;Driving             Mobility Comments: used cane, no falls ADLs Comments: PCA 5X/wk, assist with IADLs, meals, bathing, states able to do on her own but has had PCA for 6 years        OT Problem List: Decreased strength;Decreased range of motion;Decreased activity tolerance;Impaired balance (sitting and/or standing);Pain      OT Treatment/Interventions: Self-care/ADL training;Therapeutic exercise;Energy conservation;DME and/or AE instruction;Therapeutic activities;Patient/family education;Balance training    OT Goals(Current goals can be found in the care plan section) Acute Rehab OT Goals Patient Stated Goal: to return home OT Goal Formulation: With patient Time For Goal Achievement:  08/08/23 Potential to Achieve Goals: Good  OT Frequency: Min 1X/week    Co-evaluation              AM-PAC OT "6 Clicks" Daily Activity     Outcome Measure Help from another person eating meals?: None Help from another person taking care of personal grooming?: A Little Help from another person toileting, which includes using toliet, bedpan, or urinal?: A Little Help from another person bathing (including washing, rinsing, drying)?: A Little Help from another person to put on and taking off regular upper body clothing?: A Little Help from another person to put on and taking off regular lower body clothing?: A Little 6 Click Score: 19   End of Session Equipment Utilized During Treatment: Gait belt;Rolling walker (2 wheels) Nurse Communication: Mobility status  Activity Tolerance: Patient tolerated treatment well Patient left: in bed;with call bell/phone within reach  OT Visit Diagnosis: Unsteadiness on feet (R26.81);Other abnormalities of gait and mobility (R26.89);Muscle weakness (generalized) (M62.81);Pain Pain - Right/Left: Left Pain - part of body: Knee                Time: 2956-2130 OT Time Calculation (min): 35 min Charges:  OT General Charges $OT Visit: 1 Visit OT Evaluation $OT Eval Low Complexity: 1 Low OT Treatments $Self Care/Home Management : 8-22 mins  [place], OTR/L   [name] 07/25/2023, 11:36 AM

## 2023-07-25 NOTE — Plan of Care (Signed)
  Problem: Education: Goal: Ability to describe self-care measures that may prevent or decrease complications (Diabetes Survival Skills Education) will improve Outcome: Progressing Goal: Individualized Educational Video(s) Outcome: Progressing   Problem: Coping: Goal: Ability to adjust to condition or change in health will improve Outcome: Progressing   Problem: Fluid Volume: Goal: Ability to maintain a balanced intake and output will improve Outcome: Progressing   Problem: Health Behavior/Discharge Planning: Goal: Ability to identify and utilize available resources and services will improve Outcome: Progressing Goal: Ability to manage health-related needs will improve Outcome: Progressing   Problem: Metabolic: Goal: Ability to maintain appropriate glucose levels will improve Outcome: Progressing   Problem: Nutritional: Goal: Maintenance of adequate nutrition will improve Outcome: Progressing Goal: Progress toward achieving an optimal weight will improve Outcome: Progressing   Problem: Skin Integrity: Goal: Risk for impaired skin integrity will decrease Outcome: Progressing   Problem: Tissue Perfusion: Goal: Adequacy of tissue perfusion will improve Outcome: Progressing   Problem: Education: Goal: Knowledge of the prescribed therapeutic regimen will improve Outcome: Progressing Goal: Individualized Educational Video(s) Outcome: Progressing   Problem: Activity: Goal: Ability to avoid complications of mobility impairment will improve Outcome: Progressing Goal: Range of joint motion will improve Outcome: Progressing   Problem: Clinical Measurements: Goal: Postoperative complications will be avoided or minimized Outcome: Progressing   Problem: Pain Management: Goal: Pain level will decrease with appropriate interventions Outcome: Progressing   Problem: Skin Integrity: Goal: Will show signs of wound healing Outcome: Progressing   Problem: Education: Goal:  Knowledge of General Education information will improve Description: Including pain rating scale, medication(s)/side effects and non-pharmacologic comfort measures Outcome: Progressing   Problem: Health Behavior/Discharge Planning: Goal: Ability to manage health-related needs will improve Outcome: Progressing   Problem: Clinical Measurements: Goal: Ability to maintain clinical measurements within normal limits will improve Outcome: Progressing Goal: Will remain free from infection Outcome: Progressing Goal: Diagnostic test results will improve Outcome: Progressing Goal: Respiratory complications will improve Outcome: Progressing Goal: Cardiovascular complication will be avoided Outcome: Progressing   Problem: Activity: Goal: Risk for activity intolerance will decrease Outcome: Progressing   Problem: Nutrition: Goal: Adequate nutrition will be maintained Outcome: Progressing   Problem: Coping: Goal: Level of anxiety will decrease Outcome: Progressing   Problem: Elimination: Goal: Will not experience complications related to bowel motility Outcome: Progressing Goal: Will not experience complications related to urinary retention Outcome: Progressing   Problem: Pain Managment: Goal: General experience of comfort will improve Outcome: Progressing   Problem: Safety: Goal: Ability to remain free from injury will improve Outcome: Progressing   Problem: Skin Integrity: Goal: Risk for impaired skin integrity will decrease Outcome: Progressing   

## 2023-07-25 NOTE — Progress Notes (Signed)
Physical Therapy Treatment Patient Details Name: Jane [name] MRN: [id number] DOB: [birth year] Today's Date: 07/25/2023   History of Present Illness Patient is 47 y.o. female s/p Lt TKA on 07/19/23 with PMH significant for anxiety, depression, asthma, BD, DM2, neuropathy, ESRD, bil charcot foot, HTN, HLD, hx of NSTEMI.    PT Comments  Continuing work on functional mobility and activity tolerance;  BPs more stable today than last session (almost immediately post HD), and pt was able to walk household distance without pre-syncopal symptoms (see vitals flowsheets or other PT note of this date); She is sounding more confident about her ability to walk, and her L knee is nice and stable in stance, with no buckling noted this session; Plan for stairs this afternoon session; From PT POV, she is on track for DC tomorrow;   Interested in going to HD in HD recliner next session   If plan is discharge home, recommend the following: A lot of help with bathing/dressing/bathroom;Assistance with cooking/housework;Assist for transportation;Help with stairs or ramp for entrance;Supervision due to cognitive status;A lot of help with walking and/or transfers   Can travel by private vehicle        Equipment Recommendations  Rolling walker (2 wheels);BSC/3in1 (shower seat)    Recommendations for Other Services       Precautions / Restrictions Precautions Precautions: Fall Precaution Comments: watch BPs and for presyncopal symptoms Restrictions Weight Bearing Restrictions: No Other Position/Activity Restrictions: WBAT     Mobility  Bed Mobility Overal bed mobility: Needs Assistance Bed Mobility: Supine to Sit     Supine to sit: Supervision     General bed mobility comments: incr time    Transfers Overall transfer level: Needs assistance Equipment used: Rolling walker (2 wheels) Transfers: Sit to/from Stand Sit to Stand: Supervision           General transfer comment: Slow rise,  but no physical assist needed    Ambulation/Gait Ambulation/Gait assistance: Contact guard assist, Supervision Gait Distance (Feet): 60 Feet (x2) Assistive device: Rolling walker (2 wheels) Gait Pattern/deviations: Step-through pattern (emerging)       General Gait Details: Nice, stable L knee in stance; Cues to self-monitor for activity tolerance    Stairs         General stair comments: This PT demonstrated sequencing for stairs, and options for managing RW and holding to R rail (her adult kids can assist her as needed as well)   Wheelchair Mobility     Tilt Bed    Modified Rankin (Stroke Patients Only)       Balance     Sitting balance-Leahy Scale: Good     Standing balance support: During functional activity, Bilateral upper extremity supported, Reliant on assistive device for balance Standing balance-Leahy Scale: Poor (approaching Fair) Standing balance comment: One instance of loss of balance when pt let go of RW to help with line management                            Cognition Arousal: Alert Behavior During Therapy: WFL for tasks assessed/performed Overall Cognitive Status: Within Functional Limits for tasks assessed                                          Exercises Total Joint Exercises Long Arc Quad: AROM, Left, 5 reps, Seated Knee Flexion: AROM, AAROM, Left,  5 reps, Seated (With R LE giving overpressure into LLE knee flexion for self-AAROM) Goniometric ROM: approx 0-73deg    General Comments General comments (skin integrity, edema, etc.): See other PT note form today for serial BPs      Pertinent Vitals/Pain Pain Assessment Pain Location: L knee at rest Pain Descriptors / Indicators: Discomfort, Grimacing, Guarding    Home Living Family/patient expects to be discharged to:: Private residence Living Arrangements: Alone Available Help at Discharge: Family;Available PRN/intermittently Type of Home: House Home  Access: Stairs to enter Entrance Stairs-Rails: None Entrance Stairs-Number of Steps: 1 Alternate Level Stairs-Number of Steps: 12 Home Layout: Two level;Bed/bath upstairs Home Equipment: None Additional Comments: Pt lives alone, family can assist as needed.    Prior Function            PT Goals (current goals can now be found in the care plan section) Acute Rehab PT Goals PT Goal Formulation: With patient Time For Goal Achievement: 07/27/23 Potential to Achieve Goals: Good Progress towards PT goals: Progressing toward goals    Frequency    Min 1X/week      PT Plan      Co-evaluation              AM-PAC PT "6 Clicks" Mobility   Outcome Measure  Help needed turning from your back to your side while in a flat bed without using bedrails?: None Help needed moving from lying on your back to sitting on the side of a flat bed without using bedrails?: A Little Help needed moving to and from a bed to a chair (including a wheelchair)?: A Little Help needed standing up from a chair using your arms (e.g., wheelchair or bedside chair)?: A Little Help needed to walk in hospital room?: A Little Help needed climbing 3-5 steps with a railing? : A Lot 6 Click Score: 18    End of Session Equipment Utilized During Treatment: Gait belt Activity Tolerance: Patient tolerated treatment well Patient left: in chair;with call bell/phone within reach Nurse Communication: Mobility status;Other (comment) (BPs) PT Visit Diagnosis: Muscle weakness (generalized) (M62.81);Difficulty in walking, not elsewhere classified (R26.2);Unsteadiness on feet (R26.81);Other abnormalities of gait and mobility (R26.89);Pain Pain - Right/Left: Left Pain - part of body: Knee     Time: 0916-1010 PT Time Calculation (min) (ACUTE ONLY): 54 min  Charges:    $Gait Training: 23-37 mins $Therapeutic Exercise: 8-22 mins $Therapeutic Activity: 8-22 mins PT General Charges $$ ACUTE PT VISIT: 1 Visit                      Van Clines, PT  Acute Rehabilitation Services Office [phone] Secure Chat welcomed    Jane [name] 07/25/2023, 11:13 AM

## 2023-07-25 NOTE — Plan of Care (Signed)
  Problem: Education: Goal: Ability to describe self-care measures that may prevent or decrease complications (Diabetes Survival Skills Education) will improve 07/25/2023 0521 by Myrtis Ser, LPN Outcome: Progressing 07/25/2023 0520 by Myrtis Ser, LPN Outcome: Progressing Goal: Individualized Educational Video(s) Outcome: Progressing   Problem: Coping: Goal: Ability to adjust to condition or change in health will improve 07/25/2023 0521 by Myrtis Ser, LPN Outcome: Progressing 07/25/2023 0520 by Myrtis Ser, LPN Outcome: Progressing   Problem: Fluid Volume: Goal: Ability to maintain a balanced intake and output will improve 07/25/2023 0521 by Myrtis Ser, LPN Outcome: Progressing 07/25/2023 0520 by Myrtis Ser, LPN Outcome: Progressing   Problem: Health Behavior/Discharge Planning: Goal: Ability to identify and utilize available resources and services will improve 07/25/2023 0521 by Myrtis Ser, LPN Outcome: Progressing 07/25/2023 0520 by Myrtis Ser, LPN Outcome: Progressing Goal: Ability to manage health-related needs will improve 07/25/2023 0521 by Myrtis Ser, LPN Outcome: Progressing 07/25/2023 0520 by Myrtis Ser, LPN Outcome: Progressing   Problem: Metabolic: Goal: Ability to maintain appropriate glucose levels will improve 07/25/2023 0521 by Myrtis Ser, LPN Outcome: Progressing 07/25/2023 0520 by Myrtis Ser, LPN Outcome: Progressing   Problem: Nutritional: Goal: Maintenance of adequate nutrition will improve 07/25/2023 0521 by Myrtis Ser, LPN Outcome: Progressing 07/25/2023 0520 by Myrtis Ser, LPN Outcome: Progressing Goal: Progress toward achieving an optimal weight will improve Outcome: Progressing   Problem: Skin Integrity: Goal: Risk for impaired skin integrity will decrease Outcome: Progressing   Problem: Tissue Perfusion: Goal: Adequacy of tissue perfusion will improve Outcome: Progressing   Problem: Education: Goal: Knowledge of the prescribed  therapeutic regimen will improve Outcome: Progressing Goal: Individualized Educational Video(s) Outcome: Progressing   Problem: Activity: Goal: Ability to avoid complications of mobility impairment will improve Outcome: Progressing Goal: Range of joint motion will improve Outcome: Progressing   Problem: Clinical Measurements: Goal: Postoperative complications will be avoided or minimized Outcome: Progressing   Problem: Pain Management: Goal: Pain level will decrease with appropriate interventions Outcome: Progressing   Problem: Skin Integrity: Goal: Will show signs of wound healing Outcome: Progressing   Problem: Education: Goal: Knowledge of General Education information will improve Description: Including pain rating scale, medication(s)/side effects and non-pharmacologic comfort measures Outcome: Progressing   Problem: Health Behavior/Discharge Planning: Goal: Ability to manage health-related needs will improve Outcome: Progressing   Problem: Clinical Measurements: Goal: Ability to maintain clinical measurements within normal limits will improve Outcome: Progressing Goal: Will remain free from infection Outcome: Progressing Goal: Diagnostic test results will improve Outcome: Progressing Goal: Respiratory complications will improve Outcome: Progressing Goal: Cardiovascular complication will be avoided Outcome: Progressing   Problem: Activity: Goal: Risk for activity intolerance will decrease Outcome: Progressing   Problem: Nutrition: Goal: Adequate nutrition will be maintained Outcome: Progressing   Problem: Coping: Goal: Level of anxiety will decrease Outcome: Progressing   Problem: Elimination: Goal: Will not experience complications related to bowel motility Outcome: Progressing Goal: Will not experience complications related to urinary retention Outcome: Progressing   Problem: Pain Managment: Goal: General experience of comfort will  improve Outcome: Progressing   Problem: Safety: Goal: Ability to remain free from injury will improve Outcome: Progressing   Problem: Skin Integrity: Goal: Risk for impaired skin integrity will decrease Outcome: Progressing

## 2023-07-25 NOTE — Progress Notes (Signed)
Physical Therapy Treatment Patient Details Name: Jane [name] MRN: [id number] DOB: 08/10/1976 Today's Date: 07/25/2023   History of Present Illness Patient is 47 y.o. female s/p Lt TKA on 07/19/23 with PMH significant for anxiety, depression, asthma, BD, DM2, neuropathy, ESRD, bil charcot foot, HTN, HLD, hx of NSTEMI.    PT Comments  Continuing work on functional mobility and activity tolerance;  session focused on progressive amb and stair training in prep for likely DC home tomorrow; Better standing activity tolerance with both PT sessions today; Performed stair negotiation, and walking household distances well with RW; Pt is performing her HEP consistently; Discussed car transfers and asking for help at home when she needs it; On track for DC home tomorrow   If plan is discharge home, recommend the following: A little help with walking and/or transfers;A little help with bathing/dressing/bathroom;Assistance with cooking/housework;Assist for transportation;Help with stairs or ramp for entrance   Can travel by private vehicle        Equipment Recommendations  Rolling walker (2 wheels);BSC/3in1 (tub bench)    Recommendations for Other Services       Precautions / Restrictions Precautions Precautions: Fall Precaution Comments: Fall risk is present; minimized with use of RW; pt with good self-monitor, also watch BPs and for presyncopal symptoms; these were improved as of Tuesday, 9/10, but may be present after HD Restrictions Other Position/Activity Restrictions: WBAT     Mobility  Bed Mobility Overal bed mobility: Needs Assistance Bed Mobility: Supine to Sit, Sit to Supine     Supine to sit: Supervision     General bed mobility comments: incr time; supervision mostly for lines an dleads    Transfers Overall transfer level: Needs assistance Equipment used: Rolling walker (2 wheels) Transfers: Sit to/from Stand Sit to Stand: Supervision           General transfer  comment: Slow rise, but no physical assist needed    Ambulation/Gait Ambulation/Gait assistance: Contact guard assist, Supervision Gait Distance (Feet): 60 Feet (x2) Assistive device: Rolling walker (2 wheels) Gait Pattern/deviations: Step-through pattern, Decreased stance time - left       General Gait Details: Good self-monitor for activity tolerance; L knee stable in stance; noting decr knee flexion in L LE swing   Stairs Stairs: Yes Stairs assistance: Supervision Stair Management: One rail Right, Step to pattern, Forwards (with L UE simulating her hand on the wall) Number of Stairs: 5 (x2) General stair comments: Overall managing well; plans to have family assist with carrying RW   Wheelchair Mobility     Tilt Bed    Modified Rankin (Stroke Patients Only)       Balance     Sitting balance-Leahy Scale: Good     Standing balance support: During functional activity, Bilateral upper extremity supported, Reliant on assistive device for balance Standing balance-Leahy Scale: Poor Standing balance comment: One instance of loss of balance when pt let go of RW to help with line management                            Cognition Arousal: Alert Behavior During Therapy: [facility] for tasks assessed/performed Overall Cognitive Status: Within Functional Limits for tasks assessed                                 General Comments: pt with good insight into BP deficits and implication with safety and mobility;  good self-monitor for activity tolerance        Exercises Total Joint Exercises Long Arc Quad: AROM, Left, 5 reps, Seated Knee Flexion: AROM, AAROM, Left, 5 reps, Seated (With R LE giving overpressure into LLE knee flexion for self-AAROM) Goniometric ROM: approx 0-73deg    General Comments General comments (skin integrity, edema, etc.): BP 120/59 taken in standing post walk to the ortho gym; HR 98; We discussed car transfers and considerations for  managing at home safely      Pertinent Vitals/Pain Pain Assessment Pain Assessment: 0-10 Pain Score: 4  Pain Location: L knee at rest Pain Descriptors / Indicators: Discomfort, Grimacing, Guarding Pain Intervention(s): Monitored during session    Home Living                          Prior Function            PT Goals (current goals can now be found in the care plan section) Acute Rehab PT Goals Patient Stated Goal: regain strength and independence to return home; 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 PT Goal Formulation: With patient Time For Goal Achievement: 07/27/23 Potential to Achieve Goals: Good Progress towards PT goals: Progressing toward goals    Frequency    Min 1X/week      PT Plan      Co-evaluation              AM-PAC PT "6 Clicks" Mobility   Outcome Measure  Help needed turning from your back to your side while in a flat bed without using bedrails?: None Help needed moving from lying on your back to sitting on the side of a flat bed without using bedrails?: None Help needed moving to and from a bed to a chair (including a wheelchair)?: A Little Help needed standing up from a chair using your arms (e.g., wheelchair or bedside chair)?: A Little Help needed to walk in hospital room?: A Little Help needed climbing 3-5 steps with a railing? : A Little 6 Click Score: 20    End of Session Equipment Utilized During Treatment: Gait belt Activity Tolerance: Patient tolerated treatment well Patient left: with call bell/phone within reach;in bed Nurse Communication: Mobility status PT Visit Diagnosis: Muscle weakness (generalized) (M62.81);Difficulty in walking, not elsewhere classified (R26.2);Unsteadiness on feet (R26.81);Other abnormalities of gait and mobility (R26.89);Pain Pain - Right/Left: Left Pain - part of body: Knee     Time: 1349-1420 PT Time Calculation (min) (ACUTE ONLY): 31 min  Charges:    $Gait Training: 23-37 mins PT  General Charges $$ ACUTE PT VISIT: 1 Visit                     Van Clines, PT  Acute Rehabilitation Services Office [phone] Secure Chat welcomed    Levi Aland 07/25/2023, 5:39 PM

## 2023-07-25 NOTE — TOC Initial Note (Incomplete)
Transition of Care [facility]) - Initial/Assessment Note    Patient Details  Name: Jane [name] MRN: [id number] Date of Birth: 1976-10-07  Transition of Care [facility]) CM/SW Contact:    Epifanio Lesches, RN Phone Number: 07/25/2023, 11:36 AM  Clinical Narrative:                       - s/p Lt TKA on 07/19/23  From home alone. States has good family/friend support. Receives PCS every day for 2 hrs. Referral made with [employer] for DME needs, RW,BSC and tub bench. Equipment will be delivered to bedside prior to d/c.  [facility] team following for needs...  9/11@ 1420 [employer] to deliver RW and BSC to bedside prior to d/c. Insurance will not cover tub bench, pt made aware.  Expected Discharge Plan: Home w Home Health Services Barriers to Discharge: Continued Medical Work up   Patient Goals and CMS Choice     Choice offered to / list presented to : Patient      Expected Discharge Plan and Services   Discharge Planning Services: CM Consult                     DME Arranged: Bedside commode, Tub bench, Walker rolling DME Agency: [employer], [employer] Date DME Agency Contacted: 07/25/23 Time DME Agency Contacted: 1132 Representative spoke with at DME Agency: Vaughan Basta            Prior Living Arrangements/Services   Lives with:: Self Patient language and need for interpreter reviewed:: Yes Do you feel safe going back to the place where you live?: Yes      Need for Family Participation in Patient Care: Yes (Comment) Care giver support system in place?: Yes (comment)   Criminal Activity/Legal Involvement Pertinent to Current Situation/Hospitalization: No - Comment as needed  Activities of Daily Living      Permission Sought/Granted                  Emotional Assessment Appearance:: Appears stated age Attitude/Demeanor/Rapport: Engaged Affect (typically observed): Accepting Orientation: : Oriented to Self, Oriented to Place, Oriented to  Time, Oriented to  Situation Alcohol / Substance Use: Not Applicable Psych Involvement: No (comment)  Admission diagnosis:  Osteoarthritis of left knee [M17.12] Patient Active Problem List   Diagnosis Date Noted   Encounter for perioperative consultation 06/26/2023   Abnormal hematology test result 04/12/2023   Hematuria 05/26/2022   Irritable bowel syndrome (IBS) 06/03/2021   Diabetic neuropathy (HCC) 01/29/2021   Hypertensive heart disease with chronic diastolic congestive heart failure (HCC) 01/06/2021   Osteoarthritis of left knee 02/11/2019   Nausea in adult 11/30/2018   Hidradenitis suppurativa 02/12/2018   Anemia in chronic kidney disease 02/12/2018   Secondary hyperparathyroidism of renal origin (HCC) 01/30/2018   Major depressive disorder 08/11/2017   Sinus tachycardia 02/23/2017   ESRD (end stage renal disease) on dialysis (HCC) 01/05/2017   CAD S/P BMS PCI to prox LAD 03/31/2016   Abnormal uterine bleeding (AUB) 03/17/2016   PAOD (peripheral arterial occlusive disease) (HCC) 01/08/2016   Charcot foot due to diabetes mellitus (HCC) 09/07/2015   Essential hypertension 08/16/2014   Seasonal allergic rhinitis 08/16/2014   Morbid obesity (HCC) 09/26/2012   Hyperlipidemia associated with type 2 diabetes mellitus (HCC) 02/26/2009   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 [place] [address] Phone: [phone] Fax: [phone]  [facility] (New Address) - [place], [place] - [address] AT Previously: [place], [place] [address] 4th Floor [address] [place] [address] Phone: [phone] Fax: [phone]  [facility] [address] [place] [address] Phone:  [phone] Fax: [phone]  [facility] [employer]. [place], [place] - [address] [address] [place] [address] Phone: [phone] Fax: [phone]     Social Determinants of Health (SDOH) Social History: SDOH Screenings   Food Insecurity: Food Insecurity Present (06/26/2023)  Housing: Medium Risk (06/26/2023)  Transportation Needs: Unmet Transportation Needs (06/26/2023)  Utilities: At Risk (06/26/2023)  Alcohol Screen: Low Risk  (06/26/2023)  Depression (PHQ2-9): High Risk (06/26/2023)  Financial Resource Strain: High Risk (06/26/2023)  Physical Activity: Sufficiently Active (06/26/2023)  Social Connections: Moderately Isolated (06/26/2023)  Stress: Stress Concern Present (06/26/2023)  Tobacco Use: Medium Risk (07/19/2023)   SDOH Interventions:     Readmission Risk Interventions     No data to display

## 2023-07-25 NOTE — Progress Notes (Signed)
[facility] Progress Note   Subjective:   Pt seen in room. She reports she has had orthostatic hypotension when standing. Also reports her BP drops with dialysis. She is asking if she needs midodrine. BP sitting to standing stayed in the 160's systolic, but therapist reports it usually drops after standing for a few minutes. At present, no SOB, dizziness, abdominal pain, edema.   Objective Vitals:   07/24/23 1425 07/24/23 1923 07/25/23 0340 07/25/23 0716  BP:  125/64 129/66 (!) 140/64  Pulse:  93 91 95  Resp:  18 18 16   Temp:  99.1 F (37.3 C) 98.8 F (37.1 C) 100.2 F (37.9 C)  TempSrc:  Oral Oral Oral  SpO2: 100% 98% 91% 98%  Weight:      Height:       Physical Exam General: WDWN alert female in NAD Heart: RRR, no murmurs, rubs or gallops Lungs: CTA bilaterally Abdomen: Soft, non-distended, +BS Extremities: compression stocking, on R leg, L leg wrapped. No edema appreciated Dialysis Access: RUE AVF + t/b  Additional Objective Labs: Basic Metabolic Panel: Recent Labs  Lab 07/20/23 0711 07/21/23 0500 07/21/23 0944 07/24/23 0750  NA 131*  --  129* 126*  K 5.2*  --  4.8 4.4  CL 92*  --  95* 90*  CO2 21*  --  19* 21*  GLUCOSE 256*  --  174* 124*  BUN 54*  --  70* 66*  CREATININE 10.00*  --  11.22* 12.18*  CALCIUM 8.6*  --  8.0* 8.3*  PHOS  --  10.7*  --  8.2*   Liver Function Tests: Recent Labs  Lab 07/24/23 0750  ALBUMIN 2.3*   No results for input(s): "LIPASE", "AMYLASE" in the last 168 hours. CBC: Recent Labs  Lab 07/20/23 0711 07/21/23 0944 07/23/23 0450 07/24/23 0750  WBC 10.9* 6.0 5.9 6.1  HGB 8.9* 7.0* 6.9* 7.2*  HCT 28.4* 22.1* 21.9* 22.7*  MCV 87.4 86.3 86.6 85.7  PLT 195 151 180 214   Blood Culture    Component Value Date/Time   SDES  06/14/2019 1610    WOUND LEFT FOOT Performed at [facility], [address] [place], [place] [address]    SPECREQUEST  06/14/2019 4098    NONE Performed at [facility], [address] [place], [place] [address]    Waldon Reining PNEUMONIAE 06/14/2019 7829   REPTSTATUS 06/17/2019 FINAL 06/14/2019 5621    Cardiac Enzymes: No results for input(s): "CKTOTAL", "CKMB", "CKMBINDEX", "TROPONINI" in the last 168 hours. CBG: Recent Labs  Lab 07/23/23 1101 07/23/23 2024 07/24/23 1605 07/24/23 2107 07/25/23 0714  GLUCAP 203* 134* 221* 127* 200*   Iron Studies: No results for input(s): "IRON", "TIBC", "TRANSFERRIN", "FERRITIN" in the last 72 hours. @lablastinr3 @ Studies/Results: ECHOCARDIOGRAM COMPLETE  Result Date: 07/23/2023    ECHOCARDIOGRAM REPORT   Patient Name:   MONYE RAWLINGS [facility] Date of Exam: 07/23/2023 Medical Rec #:  [id number]           Height:       65.0 in Accession #:    [id number]          Weight:       206.8 lb Date of Birth:  08/29/1976           BSA:          2.006 m Patient Age:    47 years            BP:  115/59 mmHg Patient Gender: F                   HR:           91 bpm. Exam Location:  Inpatient Procedure: 2D Echo, Cardiac Doppler and Color Doppler Indications:    hypotension after procedure. End stage renal disease.  History:        Patient has prior history of Echocardiogram examinations, most                 recent 02/18/2021. CAD, end stage renal disease; Risk                 Factors:Diabetes, Hypertension and Dyslipidemia.  Sonographer:    Delcie Roch RDCS Referring Phys: [id number] [facility]  Sonographer Comments: Image acquisition challenging due to patient body habitus. IMPRESSIONS  1. Left ventricular ejection fraction, by estimation, is 60 to 65%. The left ventricle has normal function. The left ventricle has no regional wall motion abnormalities. There is mild left ventricular hypertrophy. Left ventricular diastolic parameters were normal.  2. Right ventricular systolic function is normal. The right ventricular size is normal. There is normal pulmonary artery systolic pressure.  3. The  mitral valve is normal in structure. No evidence of mitral valve regurgitation. No evidence of mitral stenosis.  4. The aortic valve is tricuspid. Aortic valve regurgitation is not visualized. No aortic stenosis is present.  5. The inferior vena cava is normal in size with greater than 50% respiratory variability, suggesting right atrial pressure of 3 mmHg. FINDINGS  Left Ventricle: Left ventricular ejection fraction, by estimation, is 60 to 65%. The left ventricle has normal function. The left ventricle has no regional wall motion abnormalities. The left ventricular internal cavity size was normal in size. There is  mild left ventricular hypertrophy. Left ventricular diastolic parameters were normal. Right Ventricle: The right ventricular size is normal. No increase in right ventricular wall thickness. Right ventricular systolic function is normal. There is normal pulmonary artery systolic pressure. The tricuspid regurgitant velocity is 2.51 m/s, and  with an assumed right atrial pressure of 3 mmHg, the estimated right ventricular systolic pressure is 28.2 mmHg. Left Atrium: Left atrial size was normal in size. Right Atrium: Right atrial size was normal in size. Pericardium: Trivial pericardial effusion is present. The pericardial effusion is posterior to the left ventricle. Mitral Valve: The mitral valve is normal in structure. No evidence of mitral valve regurgitation. No evidence of mitral valve stenosis. Tricuspid Valve: The tricuspid valve is normal in structure. Tricuspid valve regurgitation is trivial. No evidence of tricuspid stenosis. Aortic Valve: The aortic valve is tricuspid. Aortic valve regurgitation is not visualized. No aortic stenosis is present. Pulmonic Valve: The pulmonic valve was normal in structure. Pulmonic valve regurgitation is trivial. No evidence of pulmonic stenosis. Aorta: The aortic root is normal in size and structure. Venous: The inferior vena cava is normal in size with greater  than 50% respiratory variability, suggesting right atrial pressure of 3 mmHg. IAS/Shunts: No atrial level shunt detected by color flow Doppler.  LEFT VENTRICLE PLAX 2D LVIDd:         4.30 cm   Diastology LVIDs:         2.80 cm   LV e' medial:    7.40 cm/s LV PW:         1.20 cm   LV E/e' medial:  10.2 LV IVS:        1.10 cm   LV  e' lateral:   9.36 cm/s LVOT diam:     2.10 cm   LV E/e' lateral: 8.1 LV SV:         63 LV SV Index:   32 LVOT Area:     3.46 cm  RIGHT VENTRICLE             IVC RV S prime:     15.30 cm/s  IVC diam: 1.70 cm TAPSE (M-mode): 2.8 cm LEFT ATRIUM             Index        RIGHT ATRIUM           Index LA diam:        3.60 cm 1.79 cm/m   RA Area:     15.90 cm LA Vol (A2C):   35.3 ml 17.59 ml/m  RA Volume:   39.10 ml  19.49 ml/m LA Vol (A4C):   60.7 ml 30.25 ml/m LA Biplane Vol: 49.0 ml 24.42 ml/m  AORTIC VALVE LVOT Vmax:   108.00 cm/s LVOT Vmean:  72.700 cm/s LVOT VTI:    0.183 m  AORTA Ao Root diam: 2.50 cm Ao Asc diam:  2.70 cm MITRAL VALVE               TRICUSPID VALVE MV Area (PHT): 4.80 cm    TR Peak grad:   25.2 mmHg MV Decel Time: 158 msec    TR Vmax:        251.00 cm/s MV E velocity: 75.80 cm/s MV A velocity: 94.00 cm/s  SHUNTS MV E/A ratio:  0.81        Systemic VTI:  0.18 m                            Systemic Diam: 2.10 cm Charlton Haws MD Electronically signed by Charlton Haws MD Signature Date/Time: 07/23/2023/4:02:02 PM    Final    Medications:  methocarbamol (ROBAXIN) IV      apixaban  2.5 mg Oral Q12H   Chlorhexidine Gluconate Cloth  6 each Topical Q0600   Chlorhexidine Gluconate Cloth  6 each Topical Q0600   darbepoetin (ARANESP) injection - DIALYSIS  200 mcg Subcutaneous Q Mon-1800   dicyclomine  10 mg Oral TID AC   docusate sodium  100 mg Oral BID   DULoxetine  30 mg Oral Daily   estradiol  1 Applicatorful Vaginal Once per day on Monday Friday   gabapentin  100 mg Oral QHS   icosapent Ethyl  2 g Oral BID   insulin aspart  0-15 Units Subcutaneous TID WC   insulin  aspart  0-5 Units Subcutaneous QHS   insulin glargine-yfgn  10 Units Subcutaneous Daily   loratadine  10 mg Oral Daily   megestrol  40 mg Oral BID   rosuvastatin  10 mg Oral Daily   senna  1 tablet Oral BID   sevelamer carbonate  3,200 mg Oral TID WC    Outpatient Dialysis Orders: MWF [facility]  3h  450/1.5    93.1kg   2/2.5 bath  RUA AVF   Heparin 2500 - last OP HD 9/03, post wt 93.1kg   - hectorol 7 mcg IV three times per week - mircera 75 mcg IV q 2 wks, last 8/28, due 9/11  Assessment/Plan: S/P L TKA - per ortho.  ESRD - on HD MWF. Continue current schedule Hypotension - Reports hypotension on HD prior to admission and  PT reports BP does drop after standing for about 5 minutes. Bps at rest are high.  We are still holding home metoprolol and azor (combo norvasc +arb). Pharm pointed out she was getting an alpha-blocker (alfuzosin) which can cause orthostatic hypotension and should be used with caution w/ eGFR < 30- this has been stopped. Weight is up quite a bit if bed weights are accurate, need to try to get standing weight when able. There is possibly a component of general deconditioning as well.  Volume - euvolemic on exam. On RA. Do not think bed weights are accurate but Na is also trending down. Trying to get bed weights, see above. Will tentatively plan to lower UF goal for tomorrow.  Anemia esrd - Hb declined post op, received 2 units PRBC on 07/23/23. Continue aranesp q Monday MBD ckd - Ca in range. Phos high but improving. Cont binders.  Hyperkalemia - resolved. Cont renal diet.     Rogers Blocker, PA-C 07/25/2023, 10:32 AM  [facility] Pager: [phone]

## 2023-07-25 NOTE — Progress Notes (Signed)
Mobility Specialist: Progress Note   07/25/23 1542  Mobility  Activity Ambulated with assistance in hallway  Level of Assistance Contact guard assist, steadying assist  Assistive Device Front wheel walker  Distance Ambulated (ft) 120 ft  Activity Response Tolerated well  Mobility Referral Yes  $Mobility charge 1 Mobility  Mobility Specialist Start Time (ACUTE ONLY) 1430  Mobility Specialist Stop Time (ACUTE ONLY) 1450  Mobility Specialist Time Calculation (min) (ACUTE ONLY) 20 min   Standing Weight: 226.3 lbs  Pt was agreeable to mobility session - received in bed. SV for bed mobility and STS, CG for ambulation d/t 2 occurrences of slight LOB. C/o L calf feeling tight; MS educated pt on some stretches that might be helpful. Returned to room without fault. Left on EOB with all needs met, call bell in reach.   Maurene Capes Mobility Specialist Please contact via SecureChat or Rehab office at [phone]

## 2023-07-26 ENCOUNTER — Other Ambulatory Visit (HOSPITAL_COMMUNITY): Payer: Self-pay

## 2023-07-26 LAB — CBC WITH DIFFERENTIAL/PLATELET
Abs Immature Granulocytes: 0.04 10*3/uL (ref 0.00–0.07)
Basophils Absolute: 0 10*3/uL (ref 0.0–0.1)
Basophils Relative: 0 %
Eosinophils Absolute: 0.2 10*3/uL (ref 0.0–0.5)
Eosinophils Relative: 5 %
HCT: 23.8 % — ABNORMAL LOW (ref 36.0–46.0)
Hemoglobin: 7.6 g/dL — ABNORMAL LOW (ref 12.0–15.0)
Immature Granulocytes: 1 %
Lymphocytes Relative: 21 %
Lymphs Abs: 1.1 10*3/uL (ref 0.7–4.0)
MCH: 27.9 pg (ref 26.0–34.0)
MCHC: 31.9 g/dL (ref 30.0–36.0)
MCV: 87.5 fL (ref 80.0–100.0)
Monocytes Absolute: 0.6 10*3/uL (ref 0.1–1.0)
Monocytes Relative: 13 %
Neutro Abs: 3.1 10*3/uL (ref 1.7–7.7)
Neutrophils Relative %: 60 %
Platelets: 283 10*3/uL (ref 150–400)
RBC: 2.72 MIL/uL — ABNORMAL LOW (ref 3.87–5.11)
RDW: 14.4 % (ref 11.5–15.5)
WBC: 5.1 10*3/uL (ref 4.0–10.5)
nRBC: 0 % (ref 0.0–0.2)

## 2023-07-26 LAB — BASIC METABOLIC PANEL
Anion gap: 10 (ref 5–15)
BUN: 58 mg/dL — ABNORMAL HIGH (ref 6–20)
CO2: 26 mmol/L (ref 22–32)
Calcium: 9.2 mg/dL (ref 8.9–10.3)
Chloride: 92 mmol/L — ABNORMAL LOW (ref 98–111)
Creatinine, Ser: 10.72 mg/dL — ABNORMAL HIGH (ref 0.44–1.00)
GFR, Estimated: 4 mL/min — ABNORMAL LOW (ref >60)
Glucose, Bld: 208 mg/dL — ABNORMAL HIGH (ref 70–99)
Potassium: 4 mmol/L (ref 3.5–5.1)
Sodium: 128 mmol/L — ABNORMAL LOW (ref 135–145)

## 2023-07-26 LAB — GLUCOSE, CAPILLARY
Glucose-Capillary: 182 mg/dL — ABNORMAL HIGH (ref 70–99)
Glucose-Capillary: 108 mg/dL — ABNORMAL HIGH (ref 70–99)

## 2023-07-26 MED ORDER — ACETAMINOPHEN 500 MG PO TABS
1000.0000 mg | ORAL_TABLET | Freq: Three times a day (TID) | ORAL | 0 refills | Status: AC | PRN
Start: 2023-07-26 — End: ?
  Filled 2023-07-26: qty 30, 5d supply, fill #0

## 2023-07-26 MED ORDER — METHOCARBAMOL 500 MG PO TABS
500.0000 mg | ORAL_TABLET | Freq: Three times a day (TID) | ORAL | 0 refills | Status: AC | PRN
  Filled 2023-07-26: qty 20, 7d supply, fill #0

## 2023-07-26 MED ORDER — LEVOFLOXACIN 500 MG PO TABS
500.0000 mg | ORAL_TABLET | ORAL | 0 refills | Status: AC
  Filled 2023-07-26: qty 5, 10d supply, fill #0

## 2023-07-26 MED ORDER — PROMETHAZINE HCL 12.5 MG PO TABS
12.5000 mg | ORAL_TABLET | Freq: Four times a day (QID) | ORAL | 0 refills | Status: DC | PRN
  Filled 2023-07-26: qty 20, 5d supply, fill #0

## 2023-07-26 MED ORDER — DOCUSATE SODIUM 100 MG PO CAPS
100.0000 mg | ORAL_CAPSULE | Freq: Two times a day (BID) | ORAL | 0 refills | Status: AC
Start: 2023-07-26 — End: 2023-08-25
  Filled 2023-07-26: qty 60, 30d supply, fill #0

## 2023-07-26 MED ORDER — SENNA 8.6 MG PO TABS
2.0000 | ORAL_TABLET | Freq: Every day | ORAL | 0 refills | Status: AC
Start: 2023-07-26 — End: 2023-08-10
  Filled 2023-07-26: qty 30, 15d supply, fill #0

## 2023-07-26 MED ORDER — OXYCODONE-ACETAMINOPHEN 10-325 MG PO TABS
1.0000 | ORAL_TABLET | ORAL | 0 refills | Status: DC | PRN
Start: 2023-07-26 — End: 2024-03-29
  Filled 2023-07-26: qty 42, 7d supply, fill #0

## 2023-07-26 MED ORDER — APIXABAN 2.5 MG PO TABS
2.5000 mg | ORAL_TABLET | Freq: Two times a day (BID) | ORAL | 0 refills | Status: DC
  Filled 2023-07-26: qty 60, 30d supply, fill #0

## 2023-07-26 NOTE — Progress Notes (Signed)
PT Cancellation Note  Patient Details Name: Jane [name] MRN: [id number] DOB: 06/28/1976   Cancelled Treatment:    Reason Eval/Treat Not Completed: Patient declined, no reason specified (Pt just finished working with OT and states that she has been up walking to bathroom along with doing exercises on the side of the bed. Pt anticipates DC home today.)   [name] 07/26/2023, 3:04 PM

## 2023-07-26 NOTE — Progress Notes (Signed)
PT Cancellation Note  Patient Details Name: Jane [name] MRN: [id number] DOB: [birth year]   Cancelled Treatment:    Reason Eval/Treat Not Completed: Patient at procedure or test/unavailable (Pt off the floor at dialysis. Will follow up later.)   [name] 07/26/2023, 9:57 AM

## 2023-07-26 NOTE — TOC Benefit Eligibility Note (Signed)
Patient Product/process development scientist completed.    The patient is insured through [facility]. Patient has Medicare and is not eligible for a copay card, but may be able to apply for patient assistance, if available.    Ran test claim for Eliquis 2.5 mg and the current 30 day co-pay is $0.00.   This test claim was processed through [facility]- copay amounts may vary at other pharmacies due to pharmacy/plan contracts, or as the patient moves through the different stages of their insurance plan.     Roland Earl, CPHT Pharmacy Technician III Certified Patient Advocate [facility] Patient Advocate Team Direct Number: [phone]  Fax: [phone]

## 2023-07-26 NOTE — Progress Notes (Signed)
Occupational Therapy Treatment/Discharge Patient Details Name: ABBIEGAIL [name] MRN: [id number] DOB: 1976/06/17 Today's Date: 07/26/2023   History of present illness Patient is 47 y.o. female s/p Lt TKA on 07/19/23 with PMH significant for anxiety, depression, asthma, BD, DM2, neuropathy, ESRD, bil charcot foot, HTN, HLD, hx of NSTEMI.   OT comments  Pt c/o pain 2/10 at rest, minimal pain. Pt eager to return home, feels like she is doing better each day. Pt supervision for transfers/mobility, ambulated to gym to perform tub bench transfer and instruct on safety with showering/sponge bathing. Pt displays overall good safety awareness and strength/endurance to complete tasks as needed. Pt would benefit from RE, BSC, and tub bench to maximize independence/safety in home. Pt states she will have assistance most of the day each day if needed, no OT follow up needed, no further acute OT needs.       If plan is discharge home, recommend the following:  A little help with walking and/or transfers;A little help with bathing/dressing/bathroom;Assistance with cooking/housework;Help with stairs or ramp for entrance;Assist for transportation   Equipment Recommendations  BSC/3in1;Tub/shower bench;Other (comment)    Recommendations for Other Services      Precautions / Restrictions Precautions Precautions: Fall Restrictions Weight Bearing Restrictions: Yes LLE Weight Bearing: Weight bearing as tolerated       Mobility Bed Mobility Overal bed mobility: Modified Independent                  Transfers Overall transfer level: Needs assistance Equipment used: Rolling walker (2 wheels) Transfers: Sit to/from Stand, Bed to chair/wheelchair/BSC Sit to Stand: Supervision     Step pivot transfers: Supervision     General transfer comment: supervision, good safety awareness     Balance Overall balance assessment: Needs assistance Sitting-balance support: No upper extremity supported,  Feet supported Sitting balance-Leahy Scale: Good Sitting balance - Comments: EOB ADLs   Standing balance support: During functional activity Standing balance-Leahy Scale: Fair Standing balance comment: able to stand as needed completing standing ADLs at sink, supervision, no LOB                           ADL either performed or assessed with clinical judgement   ADL Overall ADL's : Needs assistance/impaired     Grooming: Supervision/safety;Standing   Upper Body Bathing: Set up;Sitting   Lower Body Bathing: Set up;Sitting/lateral leans   Upper Body Dressing : Set up;Sitting   Lower Body Dressing: Minimal assistance   Toilet Transfer: Supervision/safety;Rolling walker (2 wheels);Ambulation   Toileting- Clothing Manipulation and Hygiene: Modified independent       Functional mobility during ADLs: Supervision/safety General ADL Comments: Pt supervision for mobility/transfers, able to perform standing ADLs at sink, does need some help transporting items for dressing/bathing, min A for LLE dressing.    Extremity/Trunk Assessment Upper Extremity Assessment Upper Extremity Assessment: Overall WFL for tasks assessed   Lower Extremity Assessment Lower Extremity Assessment: Defer to PT evaluation        Vision       Perception     Praxis      Cognition Arousal: Alert Behavior During Therapy: [facility] for tasks assessed/performed Overall Cognitive Status: Within Functional Limits for tasks assessed                                          Exercises  Shoulder Instructions       General Comments      Pertinent Vitals/ Pain       Pain Assessment Pain Assessment: 0-10 Pain Score: 2  Faces Pain Scale: Hurts a little bit Pain Location: L knee at rest Pain Descriptors / Indicators: Discomfort, Grimacing, Guarding Pain Intervention(s): Monitored during session  Home Living                                           Prior Functioning/Environment              Frequency  Min 1X/week        Progress Toward Goals  OT Goals(current goals can now be found in the care plan section)  Progress towards OT goals: Progressing toward goals  Acute Rehab OT Goals Patient Stated Goal: to return home OT Goal Formulation: With patient Time For Goal Achievement: 08/08/23 Potential to Achieve Goals: Good ADL Goals Pt Will Perform Lower Body Dressing: with set-up;sit to/from stand;with adaptive equipment Pt Will Transfer to Toilet: with modified independence Pt Will Perform Tub/Shower Transfer: with modified independence;tub bench;rolling walker  Plan      Co-evaluation                 AM-PAC OT "6 Clicks" Daily Activity     Outcome Measure   Help from another person eating meals?: None Help from another person taking care of personal grooming?: A Little Help from another person toileting, which includes using toliet, bedpan, or urinal?: A Little Help from another person bathing (including washing, rinsing, drying)?: A Little Help from another person to put on and taking off regular upper body clothing?: A Little Help from another person to put on and taking off regular lower body clothing?: A Little 6 Click Score: 19    End of Session Equipment Utilized During Treatment: Gait belt;Rolling walker (2 wheels)  OT Visit Diagnosis: Unsteadiness on feet (R26.81);Other abnormalities of gait and mobility (R26.89);Muscle weakness (generalized) (M62.81);Pain Pain - Right/Left: Left Pain - part of body: Knee   Activity Tolerance Patient tolerated treatment well   Patient Left in bed;with call bell/phone within reach   Nurse Communication Mobility status        Time: 1610-9604 OT Time Calculation (min): 23 min  Charges: OT General Charges $OT Visit: 1 Visit OT Treatments $Self Care/Home Management : 8-22 mins $Therapeutic Activity: 8-22 mins  Kentrell Guettler, OTR/L   [name] 07/26/2023, 1:58 PM

## 2023-07-26 NOTE — Procedures (Signed)
I was present at this dialysis session. I have reviewed the session itself and made appropriate changes.   Progressing with PT.  BPs labile, some elevated, we will follow from outpt HD unit and adjust meds/EDW.    2K bath, UF goal of 2L using AVF.  Looks much improved.   Filed Weights   07/23/23 1100 07/24/23 0737 07/24/23 1210  Weight: 93.8 kg 103.1 kg 100.3 kg    Recent Labs  Lab 07/25/23 2056  NA 128*  K 4.8  CL 88*  CO2 24  GLUCOSE 183*  BUN 52*  CREATININE 9.76*  CALCIUM 9.1  PHOS 5.8*    Recent Labs  Lab 07/23/23 0450 07/24/23 0750 07/25/23 2056  WBC 5.9 6.1 6.0  HGB 6.9* 7.2* 8.2*  HCT 21.9* 22.7* 26.3*  MCV 86.6 85.7 87.7  PLT 180 214 279    Scheduled Meds:  apixaban  2.5 mg Oral Q12H   Chlorhexidine Gluconate Cloth  6 each Topical Q0600   Chlorhexidine Gluconate Cloth  6 each Topical Q0600   Chlorhexidine Gluconate Cloth  6 each Topical Q0600   darbepoetin (ARANESP) injection - DIALYSIS  200 mcg Subcutaneous Q Mon-1800   dicyclomine  10 mg Oral TID AC   docusate sodium  100 mg Oral BID   DULoxetine  30 mg Oral Daily   estradiol  1 Applicatorful Vaginal Once per day on Monday Friday   gabapentin  100 mg Oral QHS   icosapent Ethyl  2 g Oral BID   insulin aspart  0-15 Units Subcutaneous TID WC   insulin aspart  0-5 Units Subcutaneous QHS   insulin glargine-yfgn  10 Units Subcutaneous Daily   loratadine  10 mg Oral Daily   megestrol  40 mg Oral BID   rosuvastatin  10 mg Oral Daily   senna  1 tablet Oral BID   sevelamer carbonate  3,200 mg Oral TID WC   Continuous Infusions:  anticoagulant sodium citrate     methocarbamol (ROBAXIN) IV     PRN Meds:.acetaminophen, albuterol, alteplase, alum & mag hydroxide-simeth, anticoagulant sodium citrate, diphenhydrAMINE, heparin, HYDROmorphone (DILAUDID) injection, lidocaine (PF), lidocaine-prilocaine, menthol-cetylpyridinium **OR** phenol, methocarbamol **OR** methocarbamol (ROBAXIN) IV, metoCLOPramide **OR**  metoCLOPramide (REGLAN) injection, ondansetron **OR** ondansetron (ZOFRAN) IV, oxyCODONE, oxyCODONE, pentafluoroprop-tetrafluoroeth, polyethylene glycol, promethazine, sevelamer carbonate   [name] Heck  MD 07/26/2023, 9:09 AM

## 2023-07-26 NOTE — Progress Notes (Signed)
    Subjective:  Patient reports pain as mild to moderate.  Denies N/V/CP/SOB/Abd pain. She reports she did very well with PT yesterday and has been ambulating with walker to the bathroom. She is feeling much more confident with ambulation and feeling much better. She is eager for d/c home today after hemodialysis.   We reviewed exercises for her to do at home for increased strength and ROM until she goes to OPPT.   Objective:   VITALS:   Vitals:   07/26/23 0939 07/26/23 1000 07/26/23 1030 07/26/23 1100  BP:  (!) 156/65 126/68 124/67  Pulse:  94 86 84  Resp:  19 14 15   Temp:      TempSrc:      SpO2:  95% 97% 99%  Weight: 100 kg     Height:        Patient is sitting up in bed. NAD Neurologically intact ABD soft Neurovascular intact Sensation intact distally Intact pulses distally Dorsiflexion/Plantar flexion intact No cellulitis present Compartment soft Prevena negative pressure incisional dressing. C/D/I, no leaks detected.  Patient can perform straight leg raise, no extensor lag.  Lab Results  Component Value Date   WBC 5.1 07/26/2023   HGB 7.6 (L) 07/26/2023   HCT 23.8 (L) 07/26/2023   MCV 87.5 07/26/2023   PLT 283 07/26/2023   BMET    Component Value Date/Time   NA 128 (L) 07/26/2023 0655   NA 142 12/21/2017 1526   NA 144 09/20/2017 0000   K 4.0 07/26/2023 0655   K 5.4 09/20/2017 0000   CL 92 (L) 07/26/2023 0655   CL 112 09/20/2017 0000   CO2 26 07/26/2023 0655   CO2 22 09/20/2017 0000   GLUCOSE 208 (H) 07/26/2023 0655   BUN 58 (H) 07/26/2023 0655   BUN 69 (H) 12/21/2017 1526   CREATININE 10.72 (H) 07/26/2023 0655   CREATININE 3.40 (H) 03/27/2017 0759   CALCIUM 9.2 07/26/2023 0655   CALCIUM 8.6 (L) 11/24/2017 1420   GFRNONAA 4 (L) 07/26/2023 0655   GFRNONAA 9 (L) 09/20/2017 0000   GFRNONAA 65 06/09/2015 1211     Assessment/Plan: 7 Days Post-Op   Principal Problem:   Osteoarthritis of left knee   WBAT with walker DVT ppx:  Eliquis ,  SCDs, TEDS PO pain control PT/OT: Patient improved greatly with PT yesterday ambulating 60 feet x2 and navigating stairs, no hypotension reported.  Dispo:  - Continue glucose control with goal <200 for optimal wound healing.  -  Prevena incsional dressing intact. Upon discharge nurse to convert house vac to portable prevena unit. Patient to follow-up within 7 days of discharge for removal of dressing.  - Heading to hemodialysis. Nephrology following, appreciate recommendations. Hopeful for d/c home today if she is feeling good after hemodialysis.  - D/c pending PT clearance, nephrology recommendations, hopeful for today.    Clois Dupes, PA-C 07/26/2023, 11:17 AM   [facility]  [place] [address], [place], [place] [address] Phone: [phone] [email or web address] Facebook  [employer]

## 2023-07-26 NOTE — Progress Notes (Signed)
D/C order noted. Contacted [facility] to advise clinic of pt's d/c today and that pt should resume care on Friday.   Tracy Mounce Renal Navigator [phone] 

## 2023-07-26 NOTE — Progress Notes (Signed)
   07/26/23 1230  Vitals  Temp 98.4 F (36.9 C)  Temp Source Oral  BP (!) 170/74  BP Location Left Arm  BP Method Automatic  Oxygen Therapy  O2 Device Room Air  During Treatment Monitoring  Intra-Hemodialysis Comments Tx completed  Post Treatment  Dialyzer Clearance Lightly streaked  Hemodialysis Intake (mL) 0 mL  Liters Processed 70  Fluid Removed (mL) 2000 mL  Tolerated HD Treatment Yes  Post-Hemodialysis Comments Pt goal met. BFR 350.  AVG/AVF Arterial Site Held (minutes) 10 minutes  AVG/AVF Venous Site Held (minutes) 10 minutes  Fistula / Graft Right Upper arm Arteriovenous fistula  Placement Date/Time: 11/15/17 0901   Placed prior to admission: No  Orientation: Right  Access Location: Upper arm  Access Type: Arteriovenous fistula  Site Condition No complications  Fistula / Graft Assessment Present;Thrill;Bruit  Status Deaccessed  Needle Size 15  Drainage Description None

## 2023-07-27 ENCOUNTER — Telehealth: Payer: Self-pay

## 2023-07-27 ENCOUNTER — Ambulatory Visit (INDEPENDENT_AMBULATORY_CARE_PROVIDER_SITE_OTHER): Payer: 59 | Admitting: Licensed Clinical Social Worker

## 2023-07-27 DIAGNOSIS — F321 Major depressive disorder, single episode, moderate: Secondary | ICD-10-CM | POA: Diagnosis not present

## 2023-07-27 LAB — TYPE AND SCREEN
ABO/RH(D): O POS
Antibody Screen: NEGATIVE
Unit division: 0
Unit division: 0

## 2023-07-27 LAB — BPAM RBC
Blood Product Expiration Date: 202409302359
Blood Product Expiration Date: 202409302359
ISSUE DATE / TIME: 202409081726
Unit Type and Rh: 5100
Unit Type and Rh: 5100

## 2023-07-27 NOTE — Discharge Planning (Signed)
[facility] Patient Discharge Orders- [facility] CLINIC: [facility]  Patient's name: Jane [name] Admit/DC Dates: 07/19/2023 - 07/26/2023  Discharge Diagnoses: L knee arthoplasty   Orthostatic hypotension  Aranesp: Given: Yes   Date and amount of last dose: on 07/24/23  Last Hgb: 7.6 PRBC's Given: Yes  Date/# of units: 2 units on 07/23/23 ESA dose for discharge: mircera 200 mcg IV q 2 weeks  IV Iron dose at discharge: none  Heparin change: no  EDW Change: Yes New EDW: 94kg  Bath Change: No  Access intervention/Change: no Details:  Hectorol/Calcitriol change: no  Discharge Labs: Calcium 9.2 Phosphorus 5.8 Albumin 2.5 K+ 4.0  IV Antibiotics: no Details:  On Coumadin?: no Last INR: Next INR: Managed By:   OTHER/APPTS/LAB ORDERS:    D/C Meds to be reconciled by nurse after every discharge.  Completed By: Rogers Blocker, PA-C 07/27/2023, 8:21 AM  [facility] Pager: [phone]   Reviewed by: MD:______ RN_______

## 2023-07-27 NOTE — BH Specialist Note (Deleted)
hgf
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
## 2023-07-27 NOTE — TOC Transition Note (Signed)
Transition of Care - Initial Contact from Inpatient Facility  Date of discharge: 07/26/23 Date of contact: 07/27/23  Method: Phone Spoke to: Patient  Patient contacted to discuss transition of care from recent inpatient hospitalization. Patient was admitted to [facility] from 07/19/23-07/26/23 with discharge diagnosis of L knee arthoplasty and orthostatic hypotension  The discharge medication list was reviewed. Patient understands the changes and has no concerns.   Patient will return to her outpatient HD unit on 07/28/23 at [facility].  Salome Holmes, NP

## 2023-07-27 NOTE — Transitions of Care (Post Inpatient/ED Visit) (Signed)
07/27/2023  Name: Jane [name] MRN: [id number] DOB: [birth year]  Today's TOC FU Call Status: Today's TOC FU Call Status:: Successful TOC FU Call Completed TOC FU Call Complete Date: 07/27/23 Patient's Name and Date of Birth confirmed.  Transition Care Management Follow-up Telephone Call Date of Discharge: 07/26/23 Discharge Facility: [facility]) Type of Discharge: Inpatient Admission Primary Inpatient Discharge Diagnosis:: knee replacement How have you been since you were released from the hospital?: Better Any questions or concerns?: No  Items Reviewed: Any new allergies since your discharge?: No Dietary orders reviewed?: Yes Do you have support at home?: Yes People in Home: child(ren), adult  Medications Reviewed Today: Medications Reviewed Today     Reviewed by Karena Addison, LPN (Licensed Practical Nurse) on 07/27/23 at 1028  Med List Status: <None>   Medication Order Taking? Sig Documenting Provider Last Dose Status Informant  Accu-Chek Softclix Lancets lancets [id number] No Use to check blood sugar up to 3 (three) times daily. Doran Stabler, DO Taking Active Self  acetaminophen (TYLENOL) 500 MG tablet [id number]  Take 2 tablets (1,000 mg total) by mouth every 8 (eight) hours as needed for moderate pain, mild pain, fever or headache. [address], [place]  Active   albuterol (PROVENTIL HFA) 108 (90 Base) MCG/ACT inhaler [id number] No Inhale 2 puffs into the lungs every 4 (four) hours as needed for coughing, wheezing, or shortness of breath. Lyndle Herrlich, MD Past Week Active Self  amLODipine-olmesartan (AZOR) 5-20 MG tablet [id number] No Take 1 tablet by mouth every night  07/18/2023 Active Self  apixaban (ELIQUIS) 2.5 MG TABS tablet [id number]  Take 1 tablet (2.5 mg total) by mouth 2 (two) times daily. Clois Dupes, [place]  Active   blood glucose meter kit and supplies [id number] No Dispense based on patient and insurance preference. Use up to four times daily as  directed. (FOR ICD-10 E10.9, E11.9). Evlyn Kanner, MD Taking Active Self  blood glucose meter kit and supplies [id number] No Use in the morning, at noon, and at bedtime. Lyndle Herrlich, MD Taking Active Self  Blood Glucose Monitoring Suppl (ACCU-CHEK GUIDE) w/Device KIT [id number] No 1 each by Does not apply route 3 (three) times daily. Nyra Market, MD Taking Active Self  cetirizine (ZYRTEC) 10 MG tablet [id number] No Take 10 mg by mouth daily as needed for allergies. [provider] 07/18/2023 Active Self  Cholecalciferol (VITAMIN D) 125 MCG (5000 UT) CAPS [id number] No Take 10,000 Units by mouth daily. [provider] Past Month Active Self  Discontinued 03/13/23 1626 (Discontinued by provider) Continuous Glucose Receiver (DEXCOM G7 RECEIVER) DEVI [id number] No Use  to continuously monitor your blood sugars. Lyndle Herrlich, MD Taking Active Self  Continuous Glucose Sensor (DEXCOM G7 SENSOR) MISC [id number] No Use it for 10 days to continuously monitor your blood sugars. Faith Rogue, DO Taking Active Self  dicyclomine (BENTYL) 10 MG capsule [id number] No Take 1 capsule (10 mg total) by mouth 3 (three) times daily before meals. Lyndle Herrlich, MD 07/19/2023 0915 Active Self  docusate sodium (COLACE) 100 MG capsule [id number]  Take 1 capsule (100 mg total) by mouth 2 (two) times daily. Clint Bolder S, [place]  Active   DULoxetine (CYMBALTA) 30 MG capsule [id number] No Take 1 capsule (30 mg total) by mouth daily. Faith Rogue, DO Past Month Active Self  estradiol (ESTRACE VAGINAL) 0.1 MG/GM vaginal cream [id number] No Place 1 Applicatorful vaginally nightly for 2 weeks then 2 nights a week afterwards. Lorriane Shire, MD Past Month Active Self  gabapentin (NEURONTIN) 100 MG capsule [id number] No Take 1 capsule (100 mg total) by mouth at bedtime. Gardenia Phlegm, MD 07/18/2023 Active Self  glucose blood (ACCU-CHEK GUIDE) test strip [id number] No Use to check blood sugar up  to 3 (three) times daily. Doran Stabler, DO Taking Active Self  Glucose Blood (BLOOD GLUCOSE TEST STRIPS) STRP [id number] No Check blood sugars fasting and then with each meal up to 4 times a day. Lyndle Herrlich, MD Taking Active Self  icosapent Ethyl (VASCEPA) 1 g capsule [id number] No Take 2 capsules (2 g total) by mouth 2 (two) times daily. Reather Littler D, NP 07/18/2023 Active Self  injection device for insulin (INPEN 100-PINK-LILLY-HUMALOG) DEVI [id number] No Use to inject Humalog insulin for meals and correction insulin Lyndle Herrlich, MD Taking Active Self  insulin degludec (TRESIBA) 100 UNIT/ML FlexTouch Pen [id number] No Inject 24 Units into the skin at bedtime. Lyndle Herrlich, MD 07/18/2023 Active Self  insulin lispro (HUMALOG KWIKPEN) 100 UNIT/ML KwikPen [id number] No Use 6-20 units as directed before meals three times a day Lyndle Herrlich, MD 07/18/2023 Active Self  Insulin Pen Needle 32G X 4 MM MISC [id number] No Use to inject insulin 4 (four) times daily. Gardenia Phlegm, MD Taking Active Self  Insulin Syringe-Needle U-100 31G X 15/64" 0.3 ML MISC [id number] No Use to inject insulin daily Earl Lagos, MD Taking Active Self  Lancet Device MISC [id number] No Use 1 each in the morning, at noon, and at bedtime. Lyndle Herrlich, MD Taking Active Self  Lancets [facility] DELICA PLUS [place]) MISC [id number] No 1 each in the morning, at noon, and at bedtime. Lyndle Herrlich, MD Taking Active Self  lanthanum (FOSRENOL) 1000 MG chewable tablet [id number] No Chew 1 tablet (1,000 mg total) by mouth 3 (three) times daily with meals.  Past Month Active Self  levofloxacin (LEVAQUIN) 500 MG tablet [id number]  Take 1 tablet (500 mg total) by mouth every other day for 10 days. Clint Bolder S, [place]  Active   megestrol (MEGACE) 40 MG tablet [id number] No Take 1 tablet (40 mg total) by mouth 2 (two) times daily. Willodean Rosenthal, MD 07/19/2023 0915 Active Self   methocarbamol (ROBAXIN) 500 MG tablet [id number]  Take 1 tablet (500 mg total) by mouth every 8 (eight) hours as needed for up to 7 days for muscle spasms. [address], [place]  Active   metoprolol tartrate (LOPRESSOR) 25 MG tablet [id number] No Take 1 tablet (25 mg total) by mouth 2 (two) times daily. Do not take the night before dialysis, and take 2 hours after dialysis. Marykay Lex, MD 07/19/2023 0915 Active Self  Multiple Vitamins-Minerals (HAIR SKIN & NAILS) TABS [id number] No Take 1 tablet by mouth daily. [provider] Past Month Active Self  multivitamin (RENA-VIT) TABS tablet [id number] No Take 1 tablet by mouth daily. Gust Rung, DO Past Month Active Self  oxyCODONE-acetaminophen (PERCOCET) 10-325 MG tablet [id number]  Take 1 tablet by mouth every 4 (four) hours as needed for pain. [address], [place] S, PA-C  Active   polyethylene glycol (MIRALAX / GLYCOLAX) 17 g packet [id number] No Take 17 g by mouth daily as needed for moderate constipation. [provider] More than a month Active Self  polyethylene glycol powder (GLYCOLAX/MIRALAX) container 255 g [id number]   Crissie Sickles, MD  Active   promethazine (PHENERGAN) 12.5 MG tablet [id number]  Take 1 tablet (12.5 mg total) by mouth every 6 (six) hours as needed for nausea or vomiting. Clois Dupes, [place]  Active  rosuvastatin (CRESTOR) 40 MG tablet [id number] No Take 1 tablet (40 mg total) by mouth daily. Reather Littler D, NP 07/19/2023 0915 Active Self  senna (SENOKOT) 8.6 MG TABS tablet [id number]  Take 2 tablets (17.2 mg total) by mouth at bedtime for 15 days. Clint Bolder S, [place]  Active   sevelamer carbonate (RENVELA) 800 MG tablet [id number] No Take 1,600-3,200 mg by mouth See admin instructions. Take 3200 mg with each meal and 1600 mg with each snack [provider] 07/18/2023 Active Self            Home Care and Equipment/Supplies: Were Home Health Services Ordered?: NA Any new equipment or medical supplies  ordered?: Yes Name of Medical supply agency?: [facility] Were you able to get the equipment/medical supplies?: Yes Do you have any questions related to the use of the equipment/supplies?: No  Functional Questionnaire: Do you need assistance with bathing/showering or dressing?: Yes Do you need assistance with meal preparation?: No Do you need assistance with eating?: No Do you have difficulty maintaining continence: No Do you need assistance with getting out of bed/getting out of a chair/moving?: Yes Do you have difficulty managing or taking your medications?: No  Follow up appointments reviewed: PCP Follow-up appointment confirmed?: Yes Date of PCP follow-up appointment?: 07/27/23 Follow-up Provider: [facility] Follow-up appointment confirmed?: Yes Date of Specialist follow-up appointment?: 07/27/23 Follow-Up Specialty Provider:: surgeon Do you need transportation to your follow-up appointment?: No Do you understand care options if your condition(s) worsen?: Yes-patient verbalized understanding    SIGNATURE Karena Addison, LPN [facility] Nurse Health Advisor Direct Dial [phone]

## 2023-07-28 ENCOUNTER — Emergency Department (HOSPITAL_COMMUNITY)
Admission: EM | Admit: 2023-07-28 | Discharge: 2023-07-28 | Disposition: A | Discharge status: Home / Self Care | Payer: 59 | Attending: Emergency Medicine | Admitting: Emergency Medicine

## 2023-07-28 ENCOUNTER — Encounter (HOSPITAL_COMMUNITY): Payer: Self-pay

## 2023-07-28 ENCOUNTER — Other Ambulatory Visit: Payer: Self-pay

## 2023-07-28 DIAGNOSIS — I2510 Atherosclerotic heart disease of native coronary artery without angina pectoris: Secondary | ICD-10-CM | POA: Insufficient documentation

## 2023-07-28 DIAGNOSIS — E1122 Type 2 diabetes mellitus with diabetic chronic kidney disease: Secondary | ICD-10-CM | POA: Insufficient documentation

## 2023-07-28 DIAGNOSIS — N186 End stage renal disease: Secondary | ICD-10-CM | POA: Insufficient documentation

## 2023-07-28 DIAGNOSIS — Z96652 Presence of left artificial knee joint: Secondary | ICD-10-CM | POA: Insufficient documentation

## 2023-07-28 DIAGNOSIS — Z992 Dependence on renal dialysis: Secondary | ICD-10-CM | POA: Insufficient documentation

## 2023-07-28 DIAGNOSIS — R0602 Shortness of breath: Secondary | ICD-10-CM | POA: Diagnosis present

## 2023-07-28 DIAGNOSIS — R0609 Other forms of dyspnea: Secondary | ICD-10-CM | POA: Insufficient documentation

## 2023-07-28 DIAGNOSIS — Z794 Long term (current) use of insulin: Secondary | ICD-10-CM | POA: Diagnosis not present

## 2023-07-28 DIAGNOSIS — Z7901 Long term (current) use of anticoagulants: Secondary | ICD-10-CM | POA: Diagnosis not present

## 2023-07-28 NOTE — Discharge Instructions (Signed)
You can go to dialysis tomorrow at 540 for the 6 AM appointment and they will do 3 more hours to get some extra fluid off.  If you do start having calf pain, new leg swelling then you should return for an ultrasound but at this time there is no evidence of a DVT (blood clot in your leg).  Continue on your Eliquis.

## 2023-07-28 NOTE — ED Provider Notes (Signed)
[place] EMERGENCY DEPARTMENT AT [facility] Provider Note   CSN: [id number] Arrival date & time: 07/28/23  1808     History  Chief Complaint  Patient presents with   Shortness of Breath    Jane [name] is a 47 y.o. female.  Patient is a 47 year old female with a history of end-stage renal disease on dialysis Monday Wednesday Friday, CAD status post stent, PAD, diabetes, recent knee replacement last week that required approximately 1 week hospitalization where she underwent echo, blood transfusion.  She did have dialysis on Monday and Wednesday but reports they were pulling in the extra fluid so when she went to dialysis today she was 12 pounds over her dry weight.  Even while she was hospitalized she started noticing some shortness of breath on exertion.  She does not feel that it is any worse today.  When she gets up and moves around she does get winded and improves when she sits down and rests.  She followed up with her orthopedic office today and the person who saw her so they felt on not behind her leg and sent her to the ER to rule out a blood clot.  Patient reports she has not had any calf pain or swelling.  She feels like her leg is improving and the swelling in size.  She is not having any thigh pain or swelling.  She has been compliant with her Eliquis.  She did have a full course of dialysis today as well.  The history is provided by the patient.  Shortness of Breath      Home Medications Prior to Admission medications   Medication Sig Start Date End Date Taking? Authorizing Provider  Accu-Chek Softclix Lancets lancets Use to check blood sugar up to 3 (three) times daily. 05/23/22   Doran Stabler, DO  acetaminophen (TYLENOL) 500 MG tablet Take 2 tablets (1,000 mg total) by mouth every 8 (eight) hours as needed for moderate pain, mild pain, fever or headache. 07/26/23   Hill, Alain Honey, PA-C  albuterol (PROVENTIL HFA) 108 (90 Base) MCG/ACT inhaler Inhale 2 puffs  into the lungs every 4 (four) hours as needed for coughing, wheezing, or shortness of breath. 12/26/22   Lyndle Herrlich, MD  amLODipine-olmesartan (AZOR) 5-20 MG tablet Take 1 tablet by mouth every night 10/19/22     apixaban (ELIQUIS) 2.5 MG TABS tablet Take 1 tablet (2.5 mg total) by mouth 2 (two) times daily. 07/26/23   Hill, Alain Honey, PA-C  blood glucose meter kit and supplies Dispense based on patient and insurance preference. Use up to four times daily as directed. (FOR ICD-10 E10.9, E11.9). 01/29/21   Evlyn Kanner, MD  blood glucose meter kit and supplies Use in the morning, at noon, and at bedtime. 03/13/23   Lyndle Herrlich, MD  Blood Glucose Monitoring Suppl (ACCU-CHEK GUIDE) w/Device KIT 1 each by Does not apply route 3 (three) times daily. 05/18/17   Nyra Market, MD  cetirizine (ZYRTEC) 10 MG tablet Take 10 mg by mouth daily as needed for allergies.    [provider]  Cholecalciferol (VITAMIN D) 125 MCG (5000 UT) CAPS Take 10,000 Units by mouth daily.    [provider]  Continuous Glucose Receiver (DEXCOM G7 RECEIVER) DEVI Use  to continuously monitor your blood sugars. 03/02/23   Lyndle Herrlich, MD  Continuous Glucose Sensor (DEXCOM G7 SENSOR) MISC Use it for 10 days to continuously monitor your blood sugars. 06/26/23   Faith Rogue, DO  dicyclomine (  BENTYL) 10 MG capsule Take 1 capsule (10 mg total) by mouth 3 (three) times daily before meals. 03/13/23   Lyndle Herrlich, MD  docusate sodium (COLACE) 100 MG capsule Take 1 capsule (100 mg total) by mouth 2 (two) times daily. 07/26/23 08/25/23  Clois Dupes, PA-C  DULoxetine (CYMBALTA) 30 MG capsule Take 1 capsule (30 mg total) by mouth daily. 06/26/23 06/25/24  Faith Rogue, DO  estradiol (ESTRACE VAGINAL) 0.1 MG/GM vaginal cream Place 1 Applicatorful vaginally nightly for 2 weeks then 2 nights a week afterwards. 06/16/23   Lorriane Shire, MD  gabapentin (NEURONTIN) 100 MG capsule Take 1  capsule (100 mg total) by mouth at bedtime. 03/03/22   Gardenia Phlegm, MD  glucose blood (ACCU-CHEK GUIDE) test strip Use to check blood sugar up to 3 (three) times daily. 05/23/22   Doran Stabler, DO  Glucose Blood (BLOOD GLUCOSE TEST STRIPS) STRP Check blood sugars fasting and then with each meal up to 4 times a day. 03/13/23   Lyndle Herrlich, MD  icosapent Ethyl (VASCEPA) 1 g capsule Take 2 capsules (2 g total) by mouth 2 (two) times daily. 06/14/23   Reather Littler D, NP  injection device for insulin (INPEN 100-PINK-LILLY-HUMALOG) DEVI Use to inject Humalog insulin for meals and correction insulin 03/15/23   Lyndle Herrlich, MD  insulin degludec (TRESIBA) 100 UNIT/ML FlexTouch Pen Inject 24 Units into the skin at bedtime. 03/13/23   Lyndle Herrlich, MD  insulin lispro (HUMALOG KWIKPEN) 100 UNIT/ML KwikPen Use 6-20 units as directed before meals three times a day 03/16/23   Lyndle Herrlich, MD  Insulin Pen Needle 32G X 4 MM MISC Use to inject insulin 4 (four) times daily. 03/03/22   Gardenia Phlegm, MD  Insulin Syringe-Needle U-100 31G X 15/64" 0.3 ML MISC Use to inject insulin daily 08/17/18   Earl Lagos, MD  Lancet Device MISC Use 1 each in the morning, at noon, and at bedtime. 03/13/23   Lyndle Herrlich, MD  Lancets [facility] DELICA PLUS [place]) MISC 1 each in the morning, at noon, and at bedtime. 03/13/23   Lyndle Herrlich, MD  lanthanum (FOSRENOL) 1000 MG chewable tablet Chew 1 tablet (1,000 mg total) by mouth 3 (three) times daily with meals. 08/11/21     levofloxacin (LEVAQUIN) 500 MG tablet Take 1 tablet (500 mg total) by mouth every other day for 10 days. 07/26/23 08/05/23  Clois Dupes, PA-C  megestrol (MEGACE) 40 MG tablet Take 1 tablet (40 mg total) by mouth 2 (two) times daily. 04/06/22   Willodean Rosenthal, MD  methocarbamol (ROBAXIN) 500 MG tablet Take 1 tablet (500 mg total) by mouth every 8 (eight) hours as needed for up to 7 days for muscle  spasms. 07/26/23 08/02/23  Clois Dupes, PA-C  metoprolol tartrate (LOPRESSOR) 25 MG tablet Take 1 tablet (25 mg total) by mouth 2 (two) times daily. Do not take the night before dialysis, and take 2 hours after dialysis. 03/16/23   Marykay Lex, MD  Multiple Vitamins-Minerals (HAIR SKIN & NAILS) TABS Take 1 tablet by mouth daily.    [provider]  multivitamin (RENA-VIT) TABS tablet Take 1 tablet by mouth daily. 05/13/22   Gust Rung, DO  oxyCODONE-acetaminophen (PERCOCET) 10-325 MG tablet Take 1 tablet by mouth every 4 (four) hours as needed for pain. 07/26/23   Hill, Alain Honey, PA-C  polyethylene glycol (MIRALAX / GLYCOLAX) 17 g packet Take 17 g by mouth daily as needed for moderate constipation.  [provider]  promethazine (PHENERGAN) 12.5 MG tablet Take 1 tablet (12.5 mg total) by mouth every 6 (six) hours as needed for nausea or vomiting. 07/26/23   Hill, Alain Honey, PA-C  rosuvastatin (CRESTOR) 40 MG tablet Take 1 tablet (40 mg total) by mouth daily. 06/14/23   Reather Littler D, NP  senna (SENOKOT) 8.6 MG TABS tablet Take 2 tablets (17.2 mg total) by mouth at bedtime for 15 days. 07/26/23 08/10/23  Clois Dupes, PA-C  sevelamer carbonate (RENVELA) 800 MG tablet Take 1,600-3,200 mg by mouth See admin instructions. Take 3200 mg with each meal and 1600 mg with each snack    [provider]  Continuous Blood Gluc Transmit (DEXCOM G6 TRANSMITTER) MISC Use to check blood sugar at least 6 times a day 03/03/22 03/13/23  Gardenia Phlegm, MD      Allergies    Adhesive [tape]    Review of Systems   Review of Systems  Respiratory:  Positive for shortness of breath.     Physical Exam Updated Vital Signs BP (!) 174/103   Pulse (!) 103   Resp 18   Ht 5\' 5"  (1.651 m)   Wt 98.9 kg   LMP 05/19/2023 Comment: HAVING ABNORMAL BLEEDING  07/07/23  SpO2 100%   BMI 36.28 kg/m  Physical Exam Vitals and nursing note reviewed.  Constitutional:      General: She is not in acute  distress.    Appearance: She is well-developed.  HENT:     Head: Normocephalic and atraumatic.  Eyes:     Pupils: Pupils are equal, round, and reactive to light.  Cardiovascular:     Rate and Rhythm: Regular rhythm. Tachycardia present.     Pulses: Normal pulses.     Heart sounds: Murmur heard.     No friction rub.  Pulmonary:     Effort: Pulmonary effort is normal. No tachypnea.     Breath sounds: Normal breath sounds. No wheezing or rales.  Abdominal:     General: Bowel sounds are normal. There is no distension.     Palpations: Abdomen is soft.     Tenderness: There is no abdominal tenderness. There is no guarding or rebound.  Musculoskeletal:        General: No tenderness. Normal range of motion.     Comments: Mild 1+ edema noted to the right ankle.  No notable edema to the left lower leg.  No calf pain or swelling noted.  No pain with palpation of the medial thigh.  Patient's wound VAC is in place.  She has no significant pain behind the knee.  Pulses are intact distal to the knee replacement  Skin:    General: Skin is warm and dry.     Findings: No rash.  Neurological:     Mental Status: She is alert and oriented to person, place, and time. Mental status is at baseline.     Cranial Nerves: No cranial nerve deficit.  Psychiatric:        Behavior: Behavior normal.     ED Results / Procedures / Treatments   Labs (all labs ordered are listed, but only abnormal results are displayed) Labs Reviewed - No data to display  EKG None  Radiology No results found.  Procedures Procedures    Medications Ordered in ED Medications - No data to display  ED Course/ Medical Decision Making/ A&P  Medical Decision Making  Pt with multiple medical problems and comorbidities and presenting today with a complaint that caries a high risk for morbidity and mortality.  Here today after having an orthopedist visit they sent her here to rule out a blood  clot.  Based on patient's exam, no lower extremity swelling or pain in the calf, being on Eliquis regularly low suspicion for DVT.  However patient is having dyspnea on exertion most likely related to fluid overload.  While she was hospitalized she did receive fluids and blood and did get dialysis but she reports they she does not think they were pulling any fluid off because when she went to dialysis today when her treatment was finished she was still 12 pounds over.  Patient denies feeling short of breath at this time.  It was when she walks does not feel that it is any different from when she left the hospital.  I spoke to the [facility] dialysis unit and they will fit her in tomorrow morning at 6 AM for another session to take some extra fluid off.  At this time patient appears stable for discharge.  She and her family member are comfortable with this plan.         Final Clinical Impression(s) / ED Diagnoses Final diagnoses:  DOE (dyspnea on exertion)  ESRD (end stage renal disease) (HCC)    Rx / DC Orders ED Discharge Orders     None         Gwyneth Sprout, MD 07/28/23 1908

## 2023-07-28 NOTE — ED Triage Notes (Signed)
9/4 left total knee replacement with wound vac was at PT today after a HD treatment and became SOB with PT they told her to come r/o DVT

## 2023-08-01 ENCOUNTER — Other Ambulatory Visit (HOSPITAL_COMMUNITY): Payer: Self-pay

## 2023-08-01 MED ORDER — OXYCODONE-ACETAMINOPHEN 10-325 MG PO TABS
1.0000 | ORAL_TABLET | Freq: Every day | ORAL | 0 refills | Status: DC
Start: 2023-08-02 — End: 2024-03-29
  Filled 2023-08-03: qty 150, 30d supply, fill #0

## 2023-08-01 NOTE — Discharge Summary (Signed)
Physician Discharge Summary  Patient ID: Jane [name] MRN: [id number] DOB/AGE: 22-May-1976 47 y.o.  Admit date: 07/19/2023 Discharge date: 07/26/2023  Admission Diagnoses:  Osteoarthritis of left knee  Discharge Diagnoses:  Principal Problem:   Osteoarthritis of left knee   Past Medical History:  Diagnosis Date   Abnormal uterine bleeding (AUB)    Anxiety    Arthritis of knee    Left, Gel and cortisone injections @ [facility]   Asthma    prn inhaler   Bipolar disorder (HCC)    CAD S/P BMS PCI to prox LAD cardiologist--- dr Judithann Sauger LAD to Mid LAD lesion, 90% stenosed. Post intervention - Vision BMS 3.0 mm x 18 mm (~3.5 mm) there is a 0% residual stenosis. 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   left 2018; right 2019   Depression    Diabetic foot ulcer (HCC) 04/22/2019   Diabetic neuropathy (HCC)    feet   Diabetic retinopathy, nonproliferative, severe (HCC)    bilateral   Dyspnea    with too much fluid   ESRD (end stage renal disease) on dialysis [facility])    ?chronic interstitial nephritis  ([facility]   H/O non-ST elevation myocardial infarction (NSTEMI) 03/2016   Found in 90% mid LAD lesion treated with bare-metal stent (BMS) PCI - vision BMS 3.0 mm x 18 mm   Heart murmur    History of blood transfusion    History of MRSA infection 2007   Hyperlipidemia    Hypertension    IDA (iron deficiency anemia)    Insulin dependent type 2 diabetes mellitus (HCC)    Iron deficiency anemia    takes iron supplement   Myocardial infarction [facility])    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 female who was admitted 07/19/2023 with a diagnosis of Osteoarthritis of left knee and went to the operating room on 07/19/2023 and underwent the above named procedures.    She was given perioperative antibiotics:  Anti-infectives (From admission, onward)    Start     Dose/Rate Route Frequency Ordered Stop   07/26/23 0000  levofloxacin (LEVAQUIN) 500 MG tablet        500 mg Oral Every other day 07/26/23 1131 08/05/23 2359   07/20/23 0300  vancomycin (VANCOCIN) IVPB 1000 mg/200 mL premix        1,000 mg 200 mL/hr over 60 Minutes Intravenous Every 12 hours 07/19/23 1719 07/20/23 0357   07/19/23 1534  vancomycin (VANCOCIN) powder  Status:  Discontinued          As needed 07/19/23 1540 07/19/23 1641   07/19/23 1145  ceFAZolin (ANCEF) IVPB 2g/100 mL premix        2 g 200 mL/hr over 30 Minutes  Intravenous On call to O.R. 07/19/23 1141 07/19/23 1325       She was given sequential compression devices, early ambulation, and Eliquis for DVT prophylaxis.  POD#1 Patient did well with pain control. Limited due to orthostatic hypotension. Consult placed to diabetes coordinator for glucose control 200mg . Nephrology consulted and managed her dialysis during hospital stay. See their notes. PT limited due to orthostatic hypotension. POD#2 Patient underwent dialysis. Patient performed PT exercises in bed. Again limited due to orthostatic hypotension and dialysis.  POD#3 PT limited due to hypotension.  POD#4 Patient transfused with 2 units PRBCs due to ABLA with hemoglobin 6.9. Medications adjusted per nephrology and renal recommendations. PT in bed due to transfusion.  POD#5  Hemodialysis. Dizziness and orthostatic hypotension with PT.  POD#6 Slow progression with PT but improving. She ambulated 120 feet with PT and better BP control.  POD#7 Patient continued to progress with PT. No orthostatic hypotension. Dialysis this date. She was discharged home with OPPT.    She benefited maximally from the hospital stay and there were no complications.    Recent vital signs:  Vitals:   07/26/23 1230 07/26/23 1333  BP: (!) 170/74 (!) 153/73  Pulse:  91  Resp:  17  Temp: 98.4 F (36.9 C) 99.3 F (37.4 C)  SpO2:  100%    Recent laboratory studies:  Lab Results  Component Value Date   HGB 7.6 (L) 07/26/2023   HGB 8.2 (L) 07/25/2023   HGB 7.2 (L) 07/24/2023   Lab Results  Component Value Date   WBC 5.1 07/26/2023   PLT 283 07/26/2023   Lab Results  Component Value Date   INR 1.13 07/26/2017   Lab Results  Component Value Date   NA 128 (L) 07/26/2023   K 4.0 07/26/2023   CL 92 (L) 07/26/2023   CO2 26 07/26/2023   BUN 58 (H) 07/26/2023   CREATININE 10.72 (H) 07/26/2023   GLUCOSE 208 (H) 07/26/2023     Allergies as of 07/26/2023       Reactions   Adhesive [tape] Other (See Comments)   Irritation        Medication List     STOP taking these medications    aspirin EC 81 MG tablet   guanFACINE 1 MG Tb24 ER tablet Commonly known as: INTUNIV   leflunomide 10 MG tablet Commonly known as: ARAVA   PreviDent 5000 Booster Plus 1.1 % Pste Generic drug: Sodium Fluoride       TAKE these medications    Accu-Chek Guide test strip Generic drug: glucose blood Use to check blood sugar up to 3 (three) times daily.   OneTouch Verio test strip Generic drug: glucose blood Check blood sugars fasting and then with each meal up to 4 times a day.   Accu-Chek Guide w/Device Kit 1 each by Does not apply route 3 (three) times daily.   Accu-Chek Softclix Lancets lancets Use to check blood sugar up to 3 (three) times daily.   OneTouch Delica Plus  Lancet33G Misc 1 each in the morning, at noon, and at bedtime.   acetaminophen 500 MG tablet Commonly known as: TYLENOL Take 2 tablets (1,000 mg total) by mouth every 8 (eight) hours as needed for moderate pain, mild pain, fever or headache. What changed:  when to take this reasons to take this   albuterol 108 (90 Base) MCG/ACT inhaler Commonly known as: Proventil HFA Inhale 2 puffs into the lungs every 4 (four) hours as needed for coughing, wheezing, or  shortness of breath.   amLODipine-olmesartan 5-20 MG tablet Commonly known as: AZOR Take 1 tablet by mouth every night   BD Pen Needle Nano U/F 32G X 4 MM Misc Generic drug: Insulin Pen Needle Use to inject insulin 4 (four) times daily.   blood glucose meter kit and supplies Dispense based on patient and insurance preference. Use up to four times daily as directed. (FOR ICD-10 E10.9, E11.9).   OneTouch Verio Flex System w/Device Kit Use in the morning, at noon, and at bedtime.   cetirizine 10 MG tablet Commonly known as: ZYRTEC Take 10 mg by mouth daily as needed for allergies.   Dexcom G7 Receiver Devi Use  to continuously monitor your blood sugars.   Dexcom G7 Sensor Misc Use it for 10 days to continuously monitor your blood sugars.   dicyclomine 10 MG capsule Commonly known as: BENTYL Take 1 capsule (10 mg total) by mouth 3 (three) times daily before meals.   docusate sodium 100 MG capsule Commonly known as: Colace Take 1 capsule (100 mg total) by mouth 2 (two) times daily.   DULoxetine 30 MG capsule Commonly known as: Cymbalta Take 1 capsule (30 mg total) by mouth daily.   Eliquis 2.5 MG Tabs tablet Generic drug: apixaban Take 1 tablet (2.5 mg total) by mouth 2 (two) times daily.   estradiol 0.1 MG/GM vaginal cream Commonly known as: ESTRACE VAGINAL Place 1 Applicatorful vaginally nightly for 2 weeks then 2 nights a week afterwards.   gabapentin 100 MG capsule Commonly known as: NEURONTIN Take 1 capsule  (100 mg total) by mouth at bedtime.   Hair Skin & Nails Tabs Take 1 tablet by mouth daily.   InPen 100-Pink-Lilly-Humalog Devi Generic drug: injection device for insulin Use to inject Humalog insulin for meals and correction insulin   insulin lispro 100 UNIT/ML KwikPen Commonly known as: HumaLOG KwikPen Use 6-20 units as directed before meals three times a day What changed: Another medication with the same name was removed. Continue taking this medication, and follow the directions you see here.   Insulin Syringe-Needle U-100 31G X 15/64" 0.3 ML Misc Use to inject insulin daily   Lancet Device Misc Use 1 each in the morning, at noon, and at bedtime.   lanthanum 1000 MG chewable tablet Commonly known as: Fosrenol Chew 1 tablet (1,000 mg total) by mouth 3 (three) times daily with meals.   levofloxacin 500 MG tablet Commonly known as: LEVAQUIN Take 1 tablet (500 mg total) by mouth every other day for 10 days.   megestrol 40 MG tablet Commonly known as: MEGACE Take 1 tablet (40 mg total) by mouth 2 (two) times daily.   methocarbamol 500 MG tablet Commonly known as: ROBAXIN Take 1 tablet (500 mg total) by mouth every 8 (eight) hours as needed for up to 7 days for muscle spasms.   metoprolol tartrate 25 MG tablet Commonly known as: LOPRESSOR Take 1 tablet (25 mg total) by mouth 2 (two) times daily. Do not take the night before dialysis, and take 2 hours after dialysis.   multivitamin Tabs tablet Take 1 tablet by mouth daily.   oxyCODONE-acetaminophen 10-325 MG tablet Commonly known as: Percocet Take 1 tablet by mouth every 4 (four) hours as needed for pain. What changed:  when to take this reasons to take this Another medication with the same name was removed. Continue taking this medication, and follow the directions you see here.   polyethylene glycol 17 g packet Commonly known as: MIRALAX /  GLYCOLAX Take 17 g by mouth daily as needed for moderate constipation.    promethazine 12.5 MG tablet Commonly known as: PHENERGAN Take 1 tablet (12.5 mg total) by mouth every 6 (six) hours as needed for nausea or vomiting.   rosuvastatin 40 MG tablet Commonly known as: CRESTOR Take 1 tablet (40 mg total) by mouth daily.   senna 8.6 MG Tabs tablet Commonly known as: SENOKOT Take 2 tablets (17.2 mg total) by mouth at bedtime for 15 days.   sevelamer carbonate 800 MG tablet Commonly known as: RENVELA Take 1,600-3,200 mg by mouth See admin instructions. Take 3200 mg with each meal and 1600 mg with each snack   Tresiba FlexTouch 100 UNIT/ML FlexTouch Pen Generic drug: insulin degludec Inject 24 Units into the skin at bedtime.   Vascepa 1 g capsule Generic drug: icosapent Ethyl Take 2 capsules (2 g total) by mouth 2 (two) times daily.   Vitamin D 125 MCG (5000 UT) Caps Take 10,000 Units by mouth daily. What changed: Another medication with the same name was removed. Continue taking this medication, and follow the directions you see here.               Discharge Care Instructions  (From admission, onward)           Start     Ordered   07/26/23 0000  Weight bearing as tolerated        07/26/23 1131   07/26/23 0000  Change dressing       Comments: Do not remove your dressing.   07/26/23 1131              WEIGHT BEARING   Weight bearing as tolerated with assist device (walker, cane, etc) as directed, use it as long as suggested by your surgeon or therapist, typically at least 4-6 weeks.   EXERCISES  Results after joint replacement surgery are often greatly improved when you follow the exercise, range of motion and muscle strengthening exercises prescribed by your doctor. Safety measures are also important to protect the joint from further injury. Any time any of these exercises cause you to have increased pain or swelling, decrease what you are doing until you are comfortable again and then slowly increase them. If you have problems  or questions, call your caregiver or physical therapist for advice.   Rehabilitation is important following a joint replacement. After just a few days of immobilization, the muscles of the leg can become weakened and shrink (atrophy).  These exercises are designed to build up the tone and strength of the thigh and leg muscles and to improve motion. Often times heat used for twenty to thirty minutes before working out will loosen up your tissues and help with improving the range of motion but do not use heat for the first two weeks following surgery (sometimes heat can increase post-operative swelling).   These exercises can be done on a training (exercise) mat, on the floor, on a table or on a bed. Use whatever works the best and is most comfortable for you.    Use music or television while you are exercising so that the exercises are a pleasant break in your day. This will make your life better with the exercises acting as a break in your routine that you can look forward to.   Perform all exercises about fifteen times, three times per day or as directed.  You should exercise both the operative leg and the other leg as well.  Exercises include:   Quad Sets - Tighten up the muscle on the front of the thigh (Quad) and hold for 5-10 seconds.   Straight Leg Raises - With your knee straight (if you were given a brace, keep it on), lift the leg to 60 degrees, hold for 3 seconds, and slowly lower the leg.  Perform this exercise against resistance later as your leg gets stronger.  Leg Slides: Lying on your back, slowly slide your foot toward your buttocks, bending your knee up off the floor (only go as far as is comfortable). Then slowly slide your foot back down until your leg is flat on the floor again.  Angel Wings: Lying on your back spread your legs to the side as far apart as you can without causing discomfort.  Hamstring Strength:  Lying on your back, push your heel against the floor with your leg  straight by tightening up the muscles of your buttocks.  Repeat, but this time bend your knee to a comfortable angle, and push your heel against the floor.  You may put a pillow under the heel to make it more comfortable if necessary.   A rehabilitation program following joint replacement surgery can speed recovery and prevent re-injury in the future due to weakened muscles. Contact your doctor or a physical therapist for more information on knee rehabilitation.    CONSTIPATION  Constipation is defined medically as fewer than three stools per week and severe constipation as less than one stool per week.  Even if you have a regular bowel pattern at home, your normal regimen is likely to be disrupted due to multiple reasons following surgery.  Combination of anesthesia, postoperative narcotics, change in appetite and fluid intake all can affect your bowels.   YOU MUST use at least one of the following options; they are listed in order of increasing strength to get the job done.  They are all available over the counter, and you may need to use some, POSSIBLY even all of these options:    Drink plenty of fluids (prune juice may be helpful) and high fiber foods Colace 100 mg by mouth twice a day  Senokot for constipation as directed and as needed Dulcolax (bisacodyl), take with full glass of water  Miralax (polyethylene glycol) once or twice a day as needed.  If you have tried all these things and are unable to have a bowel movement in the first 3-4 days after surgery call either your surgeon or your primary doctor.    If you experience loose stools or diarrhea, hold the medications until you stool forms back up.  If your symptoms do not get better within 1 week or if they get worse, check with your doctor.  If you experience "the worst abdominal pain ever" or develop nausea or vomiting, please contact the office immediately for further recommendations for treatment.   ITCHING:  If you experience  itching with your medications, try taking only a single pain pill, or even half a pain pill at a time.  You can also use Benadryl over the counter for itching or also to help with sleep.   TED HOSE STOCKINGS:  Use stockings on both legs until for at least 2 weeks or as directed by physician office. They may be removed at night for sleeping.  MEDICATIONS:  See your medication summary on the "After Visit Summary" that nursing will review with you.  You may have some home medications which will be placed on hold until  you complete the course of blood thinner medication.  It is important for you to complete the blood thinner medication as prescribed.  PRECAUTIONS:  If you experience chest pain or shortness of breath - call 911 immediately for transfer to the hospital emergency department.   If you develop a fever greater that 101 F, purulent drainage from wound, increased redness or drainage from wound, foul odor from the wound/dressing, or calf pain - CONTACT YOUR SURGEON.                                                   FOLLOW-UP APPOINTMENTS:  If you do not already have a post-op appointment, please call the office for an appointment to be seen by your surgeon.  Guidelines for how soon to be seen are listed in your "After Visit Summary", but are typically between 1-4 weeks after surgery.  OTHER INSTRUCTIONS:   Knee Replacement:  Do not place pillow under knee, focus on keeping the knee straight while resting. CPM instructions: 0-90 degrees, 2 hours in the morning, 2 hours in the afternoon, and 2 hours in the evening. Place foam block, curve side up under heel at all times except when in CPM or when walking.  DO NOT modify, tear, cut, or change the foam block in any way.   MAKE SURE YOU:  Understand these instructions.  Get help right away if you are not doing well or get worse.    Thank you for letting us be a part of your medical care team.  It is a privilege we respect greatly.  We hope these  instructions will help you stay on track for a fast and full recovery!   Diagnostic Studies: ECHOCARDIOGRAM COMPLETE  Result Date: 07/23/2023    ECHOCARDIOGRAM REPORT   Patient Name:   TRIS FRATUS [facility] Date of Exam: 07/23/2023 Medical Rec #:  [id number]           Height:       65.0 in Accession #:    [id number]          Weight:       206.8 lb Date of Birth:  Aug 06, 1976           BSA:          2.006 m Patient Age:    47 years            BP:           115/59 mmHg Patient Gender: F                   HR:           91 bpm. Exam Location:  Inpatient Procedure: 2D Echo, Cardiac Doppler and Color Doppler Indications:    hypotension after procedure. End stage renal disease.  History:        Patient has prior history of Echocardiogram examinations, most                 recent 02/18/2021. CAD, end stage renal disease; Risk                 Factors:Diabetes, Hypertension and Dyslipidemia.  Sonographer:    Delcie Roch RDCS Referring Phys: [id number]  Sonographer Comments: Image acquisition challenging due to patient body habitus. IMPRESSIONS  1. Left ventricular ejection fraction, by estimation,  is 60 to 65%. The left ventricle has normal function. The left ventricle has no regional wall motion abnormalities. There is mild left ventricular hypertrophy. Left ventricular diastolic parameters were normal.  2. Right ventricular systolic function is normal. The right ventricular size is normal. There is normal pulmonary artery systolic pressure.  3. The mitral valve is normal in structure. No evidence of mitral valve regurgitation. No evidence of mitral stenosis.  4. The aortic valve is tricuspid. Aortic valve regurgitation is not visualized. No aortic stenosis is present.  5. The inferior vena cava is normal in size with greater than 50% respiratory variability, suggesting right atrial pressure of 3 mmHg. FINDINGS  Left Ventricle: Left ventricular ejection fraction, by estimation, is 60 to 65%. The left ventricle has  normal function. The left ventricle has no regional wall motion abnormalities. The left ventricular internal cavity size was normal in size. There is  mild left ventricular hypertrophy. Left ventricular diastolic parameters were normal. Right Ventricle: The right ventricular size is normal. No increase in right ventricular wall thickness. Right ventricular systolic function is normal. There is normal pulmonary artery systolic pressure. The tricuspid regurgitant velocity is 2.51 m/s, and  with an assumed right atrial pressure of 3 mmHg, the estimated right ventricular systolic pressure is 28.2 mmHg. Left Atrium: Left atrial size was normal in size. Right Atrium: Right atrial size was normal in size. Pericardium: Trivial pericardial effusion is present. The pericardial effusion is posterior to the left ventricle. Mitral Valve: The mitral valve is normal in structure. No evidence of mitral valve regurgitation. No evidence of mitral valve stenosis. Tricuspid Valve: The tricuspid valve is normal in structure. Tricuspid valve regurgitation is trivial. No evidence of tricuspid stenosis. Aortic Valve: The aortic valve is tricuspid. Aortic valve regurgitation is not visualized. No aortic stenosis is present. Pulmonic Valve: The pulmonic valve was normal in structure. Pulmonic valve regurgitation is trivial. No evidence of pulmonic stenosis. Aorta: The aortic root is normal in size and structure. Venous: The inferior vena cava is normal in size with greater than 50% respiratory variability, suggesting right atrial pressure of 3 mmHg. IAS/Shunts: No atrial level shunt detected by color flow Doppler.  LEFT VENTRICLE PLAX 2D LVIDd:         4.30 cm   Diastology LVIDs:         2.80 cm   LV e' medial:    7.40 cm/s LV PW:         1.20 cm   LV E/e' medial:  10.2 LV IVS:        1.10 cm   LV e' lateral:   9.36 cm/s LVOT diam:     2.10 cm   LV E/e' lateral: 8.1 LV SV:         63 LV SV Index:   32 LVOT Area:     3.46 cm  RIGHT VENTRICLE              IVC RV S prime:     15.30 cm/s  IVC diam: 1.70 cm TAPSE (M-mode): 2.8 cm LEFT ATRIUM             Index        RIGHT ATRIUM           Index LA diam:        3.60 cm 1.79 cm/m   RA Area:     15.90 cm LA Vol (A2C):   35.3 ml 17.59 ml/m  RA Volume:   39.10 ml  19.49 ml/m LA Vol (A4C):   60.7 ml 30.25 ml/m LA Biplane Vol: 49.0 ml 24.42 ml/m  AORTIC VALVE LVOT Vmax:   108.00 cm/s LVOT Vmean:  72.700 cm/s LVOT VTI:    0.183 m  AORTA Ao Root diam: 2.50 cm Ao Asc diam:  2.70 cm MITRAL VALVE               TRICUSPID VALVE MV Area (PHT): 4.80 cm    TR Peak grad:   25.2 mmHg MV Decel Time: 158 msec    TR Vmax:        251.00 cm/s MV E velocity: 75.80 cm/s MV A velocity: 94.00 cm/s  SHUNTS MV E/A ratio:  0.81        Systemic VTI:  0.18 m                            Systemic Diam: 2.10 cm Charlton Haws MD Electronically signed by Charlton Haws MD Signature Date/Time: 07/23/2023/4:02:02 PM    Final    DG CHEST PORT 1 VIEW  Result Date: 07/22/2023 CLINICAL DATA:  End-stage renal disease. EXAM: PORTABLE CHEST 1 VIEW COMPARISON:  November 10, 2021. FINDINGS: Stable cardiomediastinal silhouette. Lungs are clear. Bony thorax is unremarkable. IMPRESSION: No active disease. Electronically Signed   By: Lupita Raider M.D.   On: 07/22/2023 15:07   DG Knee Left Port  Result Date: 07/19/2023 CLINICAL DATA:  Status post left knee arthroplasty. EXAM: PORTABLE LEFT KNEE - 1-2 VIEW COMPARISON:  None Available. FINDINGS: Left knee arthroplasty in expected alignment. No periprosthetic lucency or fracture. There has been patellar resurfacing. Recent postsurgical change includes air and edema in the soft tissues and joint space. Anterior skin staples in place. Presumed wound VAC anteriorly. IMPRESSION: Left knee arthroplasty without immediate postoperative complication. Electronically Signed   By: Narda Rutherford M.D.   On: 07/19/2023 18:23    Disposition: Discharge disposition: 01-Home or Self Care       Discharge  Instructions     Call MD / Call 911   Complete by: As directed    If you experience chest pain or shortness of breath, CALL 911 and be transported to the hospital emergency room.  If you develope a fever above 101 F, pus (white drainage) or increased drainage or redness at the wound, or calf pain, call your surgeon's office.   Change dressing   Complete by: As directed    Do not remove your dressing.   Constipation Prevention   Complete by: As directed    Drink plenty of fluids.  Prune juice may be helpful.  You may use a stool softener, such as Colace (over the counter) 100 mg twice a day.  Use MiraLax (over the counter) for constipation as needed.   Diet - low sodium heart healthy   Complete by: As directed    Discharge instructions   Complete by: As directed    Elevate toes above nose. Use cryotherapy as needed for pain and swelling.   Do not put a pillow under the knee. Place it under the heel.   Complete by: As directed    Driving restrictions   Complete by: As directed    No driving for 6 weeks   Increase activity slowly as tolerated   Complete by: As directed    Lifting restrictions   Complete by: As directed    No lifting for 6 weeks   Post-operative opioid taper instructions:  Complete by: As directed    POST-OPERATIVE OPIOID TAPER INSTRUCTIONS: It is important to wean off of your opioid medication as soon as possible. If you do not need pain medication after your surgery it is ok to stop day one. Opioids include: Codeine, Hydrocodone(Norco, Vicodin), Oxycodone(Percocet, oxycontin) and hydromorphone amongst others.  Long term and even short term use of opiods can cause: Increased pain response Dependence Constipation Depression Respiratory depression And more.  Withdrawal symptoms can include Flu like symptoms Nausea, vomiting And more Techniques to manage these symptoms Hydrate well Eat regular healthy meals Stay active Use relaxation techniques(deep  breathing, meditating, yoga) Do Not substitute Alcohol to help with tapering If you have been on opioids for less than two weeks and do not have pain than it is ok to stop all together.  Plan to wean off of opioids This plan should start within one week post op of your joint replacement. Maintain the same interval or time between taking each dose and first decrease the dose.  Cut the total daily intake of opioids by one tablet each day Next start to increase the time between doses. The last dose that should be eliminated is the evening dose.      TED hose   Complete by: As directed    Use stockings (TED hose) for 2 weeks on both leg(s).  You may remove them at night for sleeping.   Weight bearing as tolerated   Complete by: As directed         Follow-up Information     Clois Dupes, PA-C. Schedule an appointment as soon as possible for a visit in 7 day(s).   Specialty: Orthopedic Surgery Why: Follow-up within 7 days of discharge for removal of Prevena negative pressure incisional dressing and for wound re-check Contact information: [address], [address] [place] [address] [phone]         Katheran James, DO Follow up.   Specialty: Internal Medicine Contact information: [address] [place] [address] [phone]                  Signed: Clois Dupes 08/01/2023, 1:19 PM

## 2023-08-03 ENCOUNTER — Other Ambulatory Visit: Payer: Self-pay

## 2023-08-03 ENCOUNTER — Other Ambulatory Visit (HOSPITAL_COMMUNITY): Payer: Self-pay

## 2023-08-07 ENCOUNTER — Other Ambulatory Visit (HOSPITAL_COMMUNITY): Payer: Self-pay

## 2023-08-07 MED ORDER — METHOCARBAMOL 500 MG PO TABS
500.0000 mg | ORAL_TABLET | Freq: Four times a day (QID) | ORAL | 0 refills | Status: AC | PRN
  Filled 2023-08-07: qty 20, 5d supply, fill #0

## 2023-08-10 ENCOUNTER — Other Ambulatory Visit (HOSPITAL_COMMUNITY): Payer: Self-pay

## 2023-08-15 ENCOUNTER — Encounter (HOSPITAL_COMMUNITY): Payer: Self-pay

## 2023-08-17 ENCOUNTER — Encounter: Payer: 59 | Admitting: Student

## 2023-08-17 ENCOUNTER — Ambulatory Visit (INDEPENDENT_AMBULATORY_CARE_PROVIDER_SITE_OTHER): Payer: 59 | Admitting: Licensed Clinical Social Worker

## 2023-08-17 DIAGNOSIS — F321 Major depressive disorder, single episode, moderate: Secondary | ICD-10-CM | POA: Diagnosis not present

## 2023-08-17 NOTE — BH Specialist Note (Signed)
Integrated Behavioral Health via Telemedicine Visit  08/17/2023 Jane [name] [id number]  Number of Integrated Behavioral Health Clinician visits: 2- Second Visit  Session Start time: 1330   Session End time: 1430  Total time in minutes: 60   Referring Provider: Katheran James, DO Patient/Family location: Home [facility] Provider location: Office All persons participating in visit: [facility] and Patient Types of Service: Telephone visit and Health & Behavioral Assessment/Intervention  I connected with Jane [name]  via  Telephone  and verified that I am speaking with the correct person using two identifiers. Discussed confidentiality: Yes   I discussed the limitations of telemedicine and the availability of in person appointments.  Discussed there is a possibility of technology failure and discussed alternative modes of communication if that failure occurs.  I discussed that engaging in this telemedicine visit, they consent to the provision of behavioral healthcare and the services will be billed under their insurance.  Patient and/or legal guardian expressed understanding and consented to Telemedicine visit: Yes   Presenting Concerns: Patient and/or family reports the following symptoms/concerns: During the follow-up telehealth telephone session on October 3, the patient reported significant changes in her personal relationships. She disclosed that she had recently expelled her daughter from the family home due to perceived disrespectful behavior. This decision has led to increased tension within the family dynamic and potentially reduced the patient's support system during her recovery period1. Additionally, the patient mentioned terminating relationships with several friends, further limiting her social network.  These developments present new challenges to the patient's mental health and recovery process. The loss of social connections may exacerbate her depressive symptoms  and hinder her progress in managing both her physical and emotional well-being2. The family conflict, particularly with her daughter, could be a source of additional stress and emotional turmoil, potentially impacting her ability to focus on her rehabilitation and mood improvement strategies.  Given these new circumstances, the treatment plan will need to be adjusted. Future sessions will focus on:  *Conflict Resolution and Communication *Exploring the reasons behind the conflict with her daughter *Developing effective communication strategies to address family issues *Discussing potential paths to reconciliation, if appropriate *Social Support Rebuilding *Identifying alternative sources of support during her recovery *Exploring the reasons for ending other friendships and assessing their impact *Developing strategies to maintain or rebuild healthy social connections *Coping Strategies *Enhancing coping mechanisms to deal with increased isolation and family stress *Reinforcing the importance of self-care during this challenging period *Depression Management *Reassessing the impact of these social changes on her depressive symptoms *Adjusting mood improvement techniques to address the new stressors   [facility] will continue to monitor the patient's progress closely, with particular attention to how these relationship changes affect her overall mental health and recovery process. Additional resources or referrals may be considered if the patient requires more intensive support to navigate these new challenges   Patient and/or Family's Strengths/Protective Factors: Sense of purpose  Goals Addressed: Patient will:  Reduce symptoms of: mood instability and stress   Increase knowledge and/or ability of: coping skills   Demonstrate ability to: Increase healthy adjustment to current life circumstances  Progress towards Goals: Ongoing  Interventions: Interventions utilized:  Solution-Focused  Strategies, Mindfulness or Management consultant, and Link to [employer] Standardized Assessments completed: PHQ-SADS     06/26/2023    2:55 PM 06/26/2023    2:45 PM 03/13/2023    5:05 PM  PHQ-SADS Last 3 Score only  Total GAD-7 Score  15   PHQ Adolescent Score  17 17 9      Patient  Agreed to ongoing counseling  Assessment:  The patient, a 47 year old African American female with a diagnosis of Major Depressive Disorder, moderate episode, reported a recent escalation of family conflict resulting in her decision to expel her daughter from the home due to perceived disrespectful behavior.   Patient may benefit from Ongoing individual therapy.  Plan: Follow up with behavioral health clinician on : within the next 30 days   I discussed the assessment and treatment plan with the patient and/or parent/guardian. They were provided an opportunity to ask questions and all were answered. They agreed with the plan and demonstrated an understanding of the instructions.   They were advised to call back or seek an in-person evaluation if the symptoms worsen or if the condition fails to improve as anticipated.  Christen Butter, MSW, LCSW-A She/Her Behavioral Health Clinician [facility]  Internal Medicine Center Direct [phone]  Fax [phone] Main Office Phone: [phone] [address] [place] [place], [place] [address] Website: [facility]  [place], [place]  [place]

## 2023-08-22 ENCOUNTER — Encounter (HOSPITAL_COMMUNITY): Payer: Self-pay

## 2023-08-30 ENCOUNTER — Ambulatory Visit: Payer: 59 | Admitting: Obstetrics & Gynecology

## 2023-08-30 ENCOUNTER — Encounter (HOSPITAL_COMMUNITY): Payer: Self-pay

## 2023-08-30 ENCOUNTER — Other Ambulatory Visit (HOSPITAL_COMMUNITY): Payer: Self-pay

## 2023-08-30 MED ORDER — OXYCODONE-ACETAMINOPHEN 10-325 MG PO TABS
1.0000 | ORAL_TABLET | Freq: Every day | ORAL | 0 refills | Status: DC
Start: 2023-09-02 — End: 2024-03-29
  Filled 2023-08-30 – 2023-09-04 (×2): qty 150, 30d supply, fill #0

## 2023-08-31 ENCOUNTER — Encounter (HOSPITAL_COMMUNITY): Payer: Self-pay

## 2023-08-31 ENCOUNTER — Other Ambulatory Visit: Payer: Self-pay | Admitting: Obstetrics & Gynecology

## 2023-08-31 ENCOUNTER — Encounter: Payer: Self-pay | Admitting: Obstetrics & Gynecology

## 2023-08-31 ENCOUNTER — Other Ambulatory Visit (HOSPITAL_COMMUNITY): Payer: Self-pay

## 2023-08-31 DIAGNOSIS — N939 Abnormal uterine and vaginal bleeding, unspecified: Secondary | ICD-10-CM

## 2023-09-04 ENCOUNTER — Other Ambulatory Visit: Payer: Self-pay

## 2023-09-04 ENCOUNTER — Emergency Department (HOSPITAL_BASED_OUTPATIENT_CLINIC_OR_DEPARTMENT_OTHER)
Admission: EM | Admit: 2023-09-04 | Discharge: 2023-09-04 | Disposition: A | Discharge status: Home / Self Care | Payer: 59 | Attending: Emergency Medicine | Admitting: Emergency Medicine

## 2023-09-04 ENCOUNTER — Other Ambulatory Visit (HOSPITAL_BASED_OUTPATIENT_CLINIC_OR_DEPARTMENT_OTHER): Payer: Self-pay

## 2023-09-04 ENCOUNTER — Other Ambulatory Visit (HOSPITAL_COMMUNITY): Payer: Self-pay

## 2023-09-04 ENCOUNTER — Ambulatory Visit: Payer: 59 | Admitting: Nurse Practitioner

## 2023-09-04 DIAGNOSIS — Z20822 Contact with and (suspected) exposure to covid-19: Secondary | ICD-10-CM | POA: Diagnosis not present

## 2023-09-04 DIAGNOSIS — I2510 Atherosclerotic heart disease of native coronary artery without angina pectoris: Secondary | ICD-10-CM | POA: Insufficient documentation

## 2023-09-04 DIAGNOSIS — E1122 Type 2 diabetes mellitus with diabetic chronic kidney disease: Secondary | ICD-10-CM | POA: Diagnosis not present

## 2023-09-04 DIAGNOSIS — Z7901 Long term (current) use of anticoagulants: Secondary | ICD-10-CM | POA: Insufficient documentation

## 2023-09-04 DIAGNOSIS — I120 Hypertensive chronic kidney disease with stage 5 chronic kidney disease or end stage renal disease: Secondary | ICD-10-CM | POA: Insufficient documentation

## 2023-09-04 DIAGNOSIS — Z96652 Presence of left artificial knee joint: Secondary | ICD-10-CM | POA: Diagnosis not present

## 2023-09-04 DIAGNOSIS — Z794 Long term (current) use of insulin: Secondary | ICD-10-CM | POA: Diagnosis not present

## 2023-09-04 DIAGNOSIS — N186 End stage renal disease: Secondary | ICD-10-CM | POA: Insufficient documentation

## 2023-09-04 DIAGNOSIS — Z992 Dependence on renal dialysis: Secondary | ICD-10-CM | POA: Diagnosis not present

## 2023-09-04 DIAGNOSIS — R1030 Lower abdominal pain, unspecified: Secondary | ICD-10-CM | POA: Diagnosis not present

## 2023-09-04 DIAGNOSIS — Z79899 Other long term (current) drug therapy: Secondary | ICD-10-CM | POA: Diagnosis not present

## 2023-09-04 DIAGNOSIS — N939 Abnormal uterine and vaginal bleeding, unspecified: Secondary | ICD-10-CM | POA: Insufficient documentation

## 2023-09-04 LAB — CBC
HCT: 24.9 % — ABNORMAL LOW (ref 36.0–46.0)
Hemoglobin: 7.4 g/dL — ABNORMAL LOW (ref 12.0–15.0)
MCH: 25.5 pg — ABNORMAL LOW (ref 26.0–34.0)
MCHC: 29.7 g/dL — ABNORMAL LOW (ref 30.0–36.0)
MCV: 85.9 fL (ref 80.0–100.0)
Platelets: 310 10*3/uL (ref 150–400)
RBC: 2.9 MIL/uL — ABNORMAL LOW (ref 3.87–5.11)
RDW: 17.4 % — ABNORMAL HIGH (ref 11.5–15.5)
WBC: 5.2 10*3/uL (ref 4.0–10.5)
nRBC: 1.2 % — ABNORMAL HIGH (ref 0.0–0.2)

## 2023-09-04 LAB — RESP PANEL BY RT-PCR (RSV, FLU A&B, COVID)  RVPGX2
Influenza A by PCR: NEGATIVE
Influenza B by PCR: NEGATIVE
Resp Syncytial Virus by PCR: NEGATIVE
SARS Coronavirus 2 by RT PCR: NEGATIVE

## 2023-09-04 LAB — PREGNANCY, URINE: Preg Test, Ur: NEGATIVE

## 2023-09-04 MED ORDER — MEGESTROL ACETATE 40 MG PO TABS
40.0000 mg | ORAL_TABLET | Freq: Two times a day (BID) | ORAL | 0 refills | Status: DC
  Filled 2023-09-04: qty 14, 7d supply, fill #0

## 2023-09-04 NOTE — ED Notes (Signed)
States has been bleeding for months,Has appointment on wed with her GYN states had to get her knee surgery first Is  [facility] dialysis pt, is out of her meds progesteronal

## 2023-09-04 NOTE — Discharge Instructions (Signed)
I recommend close follow-up with OB-GYN for reevaluation.  Please do not hesitate to return to emergency department if worrisome signs symptoms we discussed become apparent.

## 2023-09-04 NOTE — ED Triage Notes (Signed)
C/O vaginal bleeding on and off since beginning of month. States she needs a hysterectomy.

## 2023-09-04 NOTE — ED Provider Notes (Signed)
[place] [facility] Provider Note   CSN: [id number] Arrival date & time: 09/04/23  4098     History  Chief Complaint  Patient presents with   Vaginal Bleeding    Jane [name] is a 47 y.o. female with a history of end-stage renal disease on dialysis Monday Wednesday Friday, CAD status post stenting, PAD, diabetes, recent knee replacement presents today for evaluation of vaginal bleeding.  Patient reports intermittent vaginal bleeding in the past 1 month.  States in the past 3 to 4 days she has soaked through a pad every 3 hours.  Patient has similar symptoms before when she took megestrol but her prescription has expired.  She has an appointment with OB/GYN on 09/06/2023.  She endorses lower abdominal cramping.  Denies any fever, nausea, vomiting, chest pain or shortness of breath.   Vaginal Bleeding   Past Medical History:  Diagnosis Date   Abnormal uterine bleeding (AUB)    Anxiety    Arthritis of knee    Left, Gel and cortisone injections @ [facility]   Asthma    prn inhaler   Bipolar disorder (HCC)    CAD S/P BMS PCI to prox LAD cardiologist--- dr Judithann Sauger LAD to Mid LAD lesion, 90% stenosed. Post intervention - Vision BMS 3.0 mm x 18 mm (~3.5 mm) there is a 0% residual stenosis. 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   left 2018; right 2019   Depression    Diabetic foot ulcer (HCC) 04/22/2019   Diabetic neuropathy (HCC)    feet   Diabetic retinopathy, nonproliferative, severe (HCC)    bilateral   Dyspnea    with too much fluid   ESRD (end stage renal disease) on dialysis [facility])    ?chronic interstitial nephritis  ([facility]   H/O non-ST elevation myocardial infarction (NSTEMI) 03/2016   Found in 90% mid LAD lesion treated with bare-metal stent (BMS) PCI - vision BMS 3.0 mm x 18 mm   Heart murmur    History  of blood transfusion    History of MRSA infection 2007   Hyperlipidemia    Hypertension    IDA (iron deficiency anemia)    Insulin dependent type 2 diabetes mellitus (HCC)    Iron deficiency anemia    takes iron supplement   Myocardial infarction [facility])    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   PAOD (peripheral arterial occlusive disease) (HCC)    vascular--- dr Imogene Burn   Pneumonia 11/08/2021   PONV (postoperative nausea and vomiting)    S/P arteriovenous (AV) fistula creation 07/2017   S/p bare metal coronary artery stent 03/31/2016   BMS x1  to pLAD   Sickle cell trait (HCC)    Ulcer of left foot due to type 2 diabetes mellitus (HCC) 06/03/2019   Past Surgical History:  Procedure Laterality Date   A/V FISTULAGRAM N/A 04/13/2021   Procedure: A/V FISTULAGRAM - Right Arm;  Surgeon: Nada Libman, MD;  [facility]: [facility];  Service: Cardiovascular;  Laterality: N/A;   ACHILLES TENDON LENGTHENING Left 11/24/2016   Procedure: Left Achilles tendon lengthening (open);  Surgeon: Toni Arthurs, MD;  Location: [facility];  Service: Orthopedics;  Laterality: Left;   AV FISTULA PLACEMENT Right 07/31/2017   Procedure: ARTERIOVENOUS BRACHIOCEPHALIC FISTULA CREATION RIGHT ARM;  Surgeon: Imogene Burn,  Ottie Glazier, MD;  Location: [facility] OR;  Service: Vascular;  Laterality: Right;   CALCANEAL OSTEOTOMY Left 11/24/2016   Procedure: Left hindfoot osteotomy and fusion;  Surgeon: Toni Arthurs, MD;  Location: [facility];  Service: Orthopedics;  Laterality: Left;   CARDIAC CATHETERIZATION N/A 03/31/2016   Procedure: Left Heart Cath and Coronary Angiography;  Surgeon: Runell Gess, MD;  Location: [facility] INVASIVE CV LAB: 90% early mLAD, normal LV Fxn   CARDIAC CATHETERIZATION N/A 03/31/2016   Procedure: Coronary Stent Intervention;  Surgeon: Runell Gess, MD;  Location: [facility];  Service: Cardiovascular: PCI to mLAD BMS Vision 3.0 mm x 18  mm   CESAREAN SECTION  1997   CYSTOSCOPY WITH FULGERATION N/A 08/30/2022   Procedure: CYSTOSCOPY WITH FULGERATION, BIOPSY, LEFT RETROGRADE PYELOGRAM;  Surgeon: Noel Christmas, MD;  Location: [facility];  Service: Urology;  Laterality: N/A;   DILITATION & CURRETTAGE/HYSTROSCOPY WITH NOVASURE ABLATION N/A 09/15/2020   Procedure: DILATATION & CURETTAGE/HYSTEROSCOPY;  Surgeon: Willodean Rosenthal, MD;  Location: [facility];  Service: Gynecology;  Laterality: N/A;   DILITATION & CURRETTAGE/HYSTROSCOPY WITH NOVASURE ABLATION N/A 12/21/2021   Procedure: DILATATION & CURETTAGE/HYSTEROSCOPY WITH NOVASURE ABLATION/ IUD REMOVAL;  Surgeon: Willodean Rosenthal, MD;  Location: [facility];  Service: Gynecology;  Laterality: N/A;   FISTULA SUPERFICIALIZATION Right 11/15/2017   Procedure: FISTULA SUPERFICIALIZATION RIGHT BRACHIOCEPHALIC  RIGHT;  Surgeon: Fransisco Hertz, MD;  Location: [facility] OR;  Service: Vascular;  Laterality: Right;   FOOT SURGERY     multiple for charcot   PERIPHERAL VASCULAR BALLOON ANGIOPLASTY Right 04/13/2021   Procedure: PERIPHERAL VASCULAR BALLOON ANGIOPLASTY;  Surgeon: Nada Libman, MD;  [facility]: [facility];  Service: Cardiovascular;  Laterality: Right;  arm fistula   TOTAL KNEE ARTHROPLASTY Left 07/19/2023   Procedure: TOTAL KNEE ARTHROPLASTY;  Surgeon: Samson Frederic, MD;  Location: [facility];  Service: Orthopedics;  Laterality: Left;  150   TRANSTHORACIC ECHOCARDIOGRAM  02/2021   EF 55 to 60%.  No R WMA.  Mild septal LVH with moderate posterior hypertrophy.  Normal RV size and function.  Normal RVP/PASP.Marland Kitchen  Trivial MR.   TUBAL LIGATION  1997     Home Medications Prior to Admission medications   Medication Sig Start Date End Date Taking? Authorizing Provider  Accu-Chek Softclix Lancets lancets Use to check blood sugar up to 3 (three) times daily. 05/23/22   Doran Stabler, DO  acetaminophen (TYLENOL) 500 MG tablet Take 2 tablets (1,000 mg  total) by mouth every 8 (eight) hours as needed for moderate pain, mild pain, fever or headache. 07/26/23   Hill, Alain Honey, PA-C  albuterol (PROVENTIL HFA) 108 (90 Base) MCG/ACT inhaler Inhale 2 puffs into the lungs every 4 (four) hours as needed for coughing, wheezing, or shortness of breath. 12/26/22   Lyndle Herrlich, MD  amLODipine-olmesartan (AZOR) 5-20 MG tablet Take 1 tablet by mouth every night 10/19/22     apixaban (ELIQUIS) 2.5 MG TABS tablet Take 1 tablet (2.5 mg total) by mouth 2 (two) times daily. 07/26/23   Hill, Alain Honey, PA-C  blood glucose meter kit and supplies Dispense based on patient and insurance preference. Use up to four times daily as directed. (FOR ICD-10 E10.9, E11.9). 01/29/21   Evlyn Kanner, MD  blood glucose meter kit and supplies Use in the morning, at noon, and at bedtime. 03/13/23   Lyndle Herrlich, MD  Blood Glucose Monitoring Suppl (ACCU-CHEK GUIDE) w/Device KIT 1 each by Does not apply  route 3 (three) times daily. 05/18/17   Nyra Market, MD  cetirizine (ZYRTEC) 10 MG tablet Take 10 mg by mouth daily as needed for allergies.    [provider]  Cholecalciferol (VITAMIN D) 125 MCG (5000 UT) CAPS Take 10,000 Units by mouth daily.    [provider]  Continuous Glucose Receiver (DEXCOM G7 RECEIVER) DEVI Use  to continuously monitor your blood sugars. 03/02/23   Lyndle Herrlich, MD  Continuous Glucose Sensor (DEXCOM G7 SENSOR) MISC Use it for 10 days to continuously monitor your blood sugars. 06/26/23   Faith Rogue, DO  dicyclomine (BENTYL) 10 MG capsule Take 1 capsule (10 mg total) by mouth 3 (three) times daily before meals. 03/13/23   Lyndle Herrlich, MD  DULoxetine (CYMBALTA) 30 MG capsule Take 1 capsule (30 mg total) by mouth daily. 06/26/23 06/25/24  Faith Rogue, DO  estradiol (ESTRACE VAGINAL) 0.1 MG/GM vaginal cream Place 1 Applicatorful vaginally nightly for 2 weeks then 2 nights a week afterwards. 06/16/23   Lorriane Shire, MD  gabapentin (NEURONTIN) 100 MG capsule Take 1 capsule (100 mg total) by mouth at bedtime. 03/03/22   Gardenia Phlegm, MD  glucose blood (ACCU-CHEK GUIDE) test strip Use to check blood sugar up to 3 (three) times daily. 05/23/22   Doran Stabler, DO  Glucose Blood (BLOOD GLUCOSE TEST STRIPS) STRP Check blood sugars fasting and then with each meal up to 4 times a day. 03/13/23   Lyndle Herrlich, MD  icosapent Ethyl (VASCEPA) 1 g capsule Take 2 capsules (2 g total) by mouth 2 (two) times daily. 06/14/23   Reather Littler D, NP  injection device for insulin (INPEN 100-PINK-LILLY-HUMALOG) DEVI Use to inject Humalog insulin for meals and correction insulin 03/15/23   Lyndle Herrlich, MD  insulin degludec (TRESIBA) 100 UNIT/ML FlexTouch Pen Inject 24 Units into the skin at bedtime. 03/13/23   Lyndle Herrlich, MD  insulin lispro (HUMALOG KWIKPEN) 100 UNIT/ML KwikPen Use 6-20 units as directed before meals three times a day 03/16/23   Lyndle Herrlich, MD  Insulin Pen Needle 32G X 4 MM MISC Use to inject insulin 4 (four) times daily. 03/03/22   Gardenia Phlegm, MD  Insulin Syringe-Needle U-100 31G X 15/64" 0.3 ML MISC Use to inject insulin daily 08/17/18   Earl Lagos, MD  Lancet Device MISC Use 1 each in the morning, at noon, and at bedtime. 03/13/23   Lyndle Herrlich, MD  Lancets [facility] DELICA PLUS [place]) MISC 1 each in the morning, at noon, and at bedtime. 03/13/23   Lyndle Herrlich, MD  lanthanum (FOSRENOL) 1000 MG chewable tablet Chew 1 tablet (1,000 mg total) by mouth 3 (three) times daily with meals. 08/11/21     megestrol (MEGACE) 40 MG tablet Take 1 tablet (40 mg total) by mouth 2 (two) times daily. 04/06/22   Willodean Rosenthal, MD  methocarbamol (ROBAXIN) 500 MG tablet Take 1 tablet (500 mg total) by mouth every 6 (six) hours as needed for muscle spasms. 08/07/23     metoprolol tartrate (LOPRESSOR) 25 MG tablet Take 1 tablet (25 mg total) by mouth 2  (two) times daily. Do not take the night before dialysis, and take 2 hours after dialysis. 03/16/23   Marykay Lex, MD  Multiple Vitamins-Minerals (HAIR SKIN & NAILS) TABS Take 1 tablet by mouth daily.    [provider]  multivitamin (RENA-VIT) TABS tablet Take 1 tablet by mouth daily. 05/13/22   Gust Rung, DO  oxyCODONE-acetaminophen (PERCOCET) 10-325 MG tablet Take  1 tablet by mouth every 4 (four) hours as needed for pain. 07/26/23   Clois Dupes, PA-C  oxyCODONE-acetaminophen (PERCOCET) 10-325 MG tablet Take 1 tablet by mouth 5 (five) times daily as needed  08/02/23 08/02/23     oxyCODONE-acetaminophen (PERCOCET) 10-325 MG tablet Take 1 tablet by mouth 5 (five) times daily as needed. 09/02/23     polyethylene glycol (MIRALAX / GLYCOLAX) 17 g packet Take 17 g by mouth daily as needed for moderate constipation.    [provider]  promethazine (PHENERGAN) 12.5 MG tablet Take 1 tablet (12.5 mg total) by mouth every 6 (six) hours as needed for nausea or vomiting. 07/26/23   Hill, Alain Honey, PA-C  rosuvastatin (CRESTOR) 40 MG tablet Take 1 tablet (40 mg total) by mouth daily. 06/14/23   Reather Littler D, NP  sevelamer carbonate (RENVELA) 800 MG tablet Take 1,600-3,200 mg by mouth See admin instructions. Take 3200 mg with each meal and 1600 mg with each snack    [provider]  Continuous Blood Gluc Transmit (DEXCOM G6 TRANSMITTER) MISC Use to check blood sugar at least 6 times a day 03/03/22 03/13/23  Gardenia Phlegm, MD      Allergies    Adhesive [tape]    Review of Systems   Review of Systems  Genitourinary:  Positive for vaginal bleeding.    Physical Exam Updated Vital Signs BP (!) 188/90 (BP Location: Left Arm)   Pulse 90   Temp 97.7 F (36.5 C) (Oral)   Resp 18   Ht 5\' 5"  (1.651 m)   Wt 99.3 kg   SpO2 99%   BMI 36.44 kg/m  Physical Exam Vitals and nursing note reviewed.  Constitutional:      Appearance: Normal appearance.  HENT:     Head:  Normocephalic and atraumatic.     Mouth/Throat:     Mouth: Mucous membranes are moist.  Eyes:     General: No scleral icterus. Cardiovascular:     Rate and Rhythm: Normal rate and regular rhythm.     Pulses: Normal pulses.     Heart sounds: Normal heart sounds.  Pulmonary:     Effort: Pulmonary effort is normal.     Breath sounds: Normal breath sounds.  Abdominal:     General: Abdomen is flat.     Palpations: Abdomen is soft.     Tenderness: There is no abdominal tenderness.  Genitourinary:    Comments: Pelvic exam with NT present throughout examination. Noted blood in the vagina. No clots. Musculoskeletal:        General: No deformity.  Skin:    General: Skin is warm.     Findings: No rash.  Neurological:     General: No focal deficit present.     Mental Status: She is alert.  Psychiatric:        Mood and Affect: Mood normal.    ED Results / Procedures / Treatments   Labs (all labs ordered are listed, but only abnormal results are displayed) Labs Reviewed - No data to display  EKG None  Radiology No results found.  Procedures Procedures    Medications Ordered in ED Medications - No data to display  ED Course/ Medical Decision Making/ A&P                                 Medical Decision Making Amount and/or Complexity of Data Reviewed Labs: ordered.  Risk  Prescription drug management.   This patient presents to the ED for vaginal bleeding, this involves an extensive number of treatment options, and is a complaint that carries with a high risk of complications and morbidity.  The differential diagnosis includes polyp, adenomyosis, leiomyoma, coagulopathy, ovulatory dysfunction, endometrial, iatrogenic.  This is not an exhaustive list.  Lab tests: I ordered and personally interpreted labs.  The pertinent results include: WBC unremarkable. Hbg 7.4 around baseline. Platelets unremarkable. Electrolytes unremarkable. BUN, creatinine unremarkable. Viral panel  is pending.  Problem list/ ED course/ Critical interventions/ Medical management: HPI: See above Vital signs within normal range and stable throughout visit. Laboratory/imaging studies significant for: See above. On physical examination, patient is afebrile and appears in no acute distress.  Pelvic exam with RN present throughout examination.  There was some blood in the vaginal canal, no significant active bleeding noted. Cervix is within normal limits.  Hemoglobin was found to be 7.4 around baseline. She denies any headache, dizziness, lightheadedness. Urine pregnancy test was negative. Patient has an appointment with OB/GYN on 09/06/2023.  Will prescribe megestrol to stop bleeding.  Patient is agreeable to plan.  She is stable for discharge.  Strict ED return precaution discussed. I have reviewed the patient home medicines and have made adjustments as needed.  Cardiac monitoring/EKG: The patient was maintained on a cardiac monitor.  I personally reviewed and interpreted the cardiac monitor which showed an underlying rhythm of: sinus rhythm.  Additional history obtained: External records from outside source obtained and reviewed including: Chart review including previous notes, labs, imaging.  Consultations obtained:  Disposition Continued outpatient therapy. Follow-up with PCP recommended for reevaluation of symptoms. Treatment plan discussed with patient.  Pt acknowledged understanding was agreeable to the plan. Worrisome signs and symptoms were discussed with patient, and patient acknowledged understanding to return to the ED if they noticed these signs and symptoms. Patient was stable upon discharge.   This chart was dictated using voice recognition software.  Despite best efforts to proofread,  errors can occur which can change the documentation meaning.          Final Clinical Impression(s) / ED Diagnoses Final diagnoses:  Vaginal bleeding    Rx / DC Orders ED Discharge  Orders          Ordered    megestrol (MEGACE) 40 MG tablet  2 times daily        09/04/23 1209              Jeanelle Malling, [place] 09/04/23 1226    Lonell Grandchild, MD 09/05/23 [phone]

## 2023-09-06 ENCOUNTER — Ambulatory Visit (INDEPENDENT_AMBULATORY_CARE_PROVIDER_SITE_OTHER): Payer: 59 | Admitting: Obstetrics & Gynecology

## 2023-09-06 ENCOUNTER — Other Ambulatory Visit (HOSPITAL_BASED_OUTPATIENT_CLINIC_OR_DEPARTMENT_OTHER): Payer: Self-pay

## 2023-09-06 ENCOUNTER — Encounter: Payer: Self-pay | Admitting: Obstetrics & Gynecology

## 2023-09-06 ENCOUNTER — Encounter (HOSPITAL_COMMUNITY): Payer: Self-pay

## 2023-09-06 VITALS — BP 181/73 | HR 113 | Wt 209.0 lb

## 2023-09-06 DIAGNOSIS — N939 Abnormal uterine and vaginal bleeding, unspecified: Secondary | ICD-10-CM

## 2023-09-06 MED ORDER — MEGESTROL ACETATE 40 MG PO TABS
40.0000 mg | ORAL_TABLET | Freq: Two times a day (BID) | ORAL | 5 refills | Status: DC
  Filled 2023-09-06 – 2023-11-01 (×2): qty 60, 30d supply, fill #0
  Filled 2023-11-01: qty 60, 30d supply, fill #1

## 2023-09-06 NOTE — Progress Notes (Signed)
History:  47 y.o. [name] here today for f/u of AUB. Pt is planning to have RATH. Dr. Briscoe [name] was waiting until after pt had her knee surgery and recovered from that.  She reports that in early Oct, her heavy bleeding returned for 1 week. She has since had only light bleeding. She was given a Rx for Megace however she ran out. She is interested in persuing surgery now that her knee is healed.    The following portions of the patient's history were reviewed and updated as appropriate: allergies, current medications, past family history, past medical history, past social history, past surgical history and problem list.  Review of Systems:  Pertinent items are noted in HPI.    Objective:  Physical Exam Blood pressure (!) 181/73, pulse (!) 113, weight 209 lb (94.8 kg). Gen: NAD Abd: Soft, nontender and nondistended Pelvic: Normal appearing external genitalia; normal appearing vaginal mucosa and cervix.  Normal discharge.  Small uterus, no other palpable masses, no uterine or adnexal tenderness  Labs and Imaging 10/21/2021 CLINICAL DATA:  Initial evaluation for lost IUD strings.   EXAM: ULTRASOUND PELVIS TRANSVAGINAL   TECHNIQUE: Transvaginal ultrasound examination of the pelvis was performed including evaluation of the uterus, ovaries, adnexal regions, and pelvic cul-de-sac.   COMPARISON:  Prior ultrasound from 04/15/2020.   FINDINGS: Uterus   Measurements: 8.0 x 5.0 x 5.6 cm = volume: 117.4 mL. Heterogeneous echotexture seen within the uterine myometrium. 0.9 x 0.6 x 1.1 cm cystic lesion within the uterine myometrium at the posterior uterine body noted, likely a small cyst, of doubtful significance. No discrete fibroids.   Endometrium   Thickness: 28 mm. Endometrial stripe is heterogeneous with poorly defined endometrial-myometrial interface, which could reflect a degree of underlying adenomyosis IUD appears to be in place within the endometrial cavity at the level of the lower  uterine segment.   Right ovary   Measurements: 2.6 x 1.5 x 1.5 cm = volume: 3.0 mL. Normal appearance/no adnexal mass.   Left ovary   Measurements: 3.1 x 1.5 x 2.2 cm = volume: 5.2 mL. Normal appearance/no adnexal mass.   Other findings:  No abnormal free fluid   IMPRESSION: 1. IUD in place within the endometrial cavity at the level of the lower uterine segment. 2. Thickened endometrial stripe measuring up to 28 mm, considered abnormal. Consider follow-up by [place] in 6-8 weeks, during the week immediately following menses (exam timing is critical). 3. Question underlying uterine adenomyosis.   06/18/2023 CLINICAL DATA:  AUB.   EXAM: TRANSABDOMINAL AND TRANSVAGINAL ULTRASOUND OF PELVIS   TECHNIQUE: Both transabdominal and transvaginal ultrasound examinations of the pelvis were performed. Transabdominal technique was performed for global imaging of the pelvis including uterus, ovaries, adnexal regions, and pelvic cul-de-sac. It was necessary to proceed with endovaginal exam following the transabdominal exam to visualize the endometrium and ovaries.   COMPARISON:  October 21, 2021   FINDINGS: Uterus   Measurements: 8.3 x 5.6 x 5.2 cm = volume: 125.1 mL. There is a 0.7 x 0.6 x 0.5 cm uterine fibroid in the anterior lower myometrium.   Endometrium   Thickness: 10.5 mm.  No focal abnormality visualized.   Right ovary   Measurements: 3.5 x 2 x 2.1 cm = volume: 7.4 mL. Normal appearance/no adnexal mass.   Left ovary   Measurements: 1.6 x 1.6 x 1.9 cm = volume: 2.6 mL. Normal appearance/no adnexal mass.   Other findings   No abnormal free fluid.   IMPRESSION: No acute sonographic abnormality.  Small uterine fibroid.   Endometrium measures 10.5 mm. If bleeding remains unresponsive to hormonal or medical therapy, sonohysterogram should be considered for focal lesion work-up. (Ref: Radiological Reasoning: Algorithmic Workup of Abnormal Vaginal Bleeding with  Endovaginal Sonography and Sonohysterography. AJR 2008; 324:M01-02)   Assessment & Plan:  AUB- persistent despite multiple tx attempts. Pt anticipate RATH now that her knee surgery has been completed.   Megace 40mg  bid Referral back to Dr. Briscoe [name].   Jane [name], M.D., [place]

## 2023-09-12 ENCOUNTER — Ambulatory Visit (INDEPENDENT_AMBULATORY_CARE_PROVIDER_SITE_OTHER): Payer: 59 | Admitting: Licensed Clinical Social Worker

## 2023-09-12 DIAGNOSIS — F321 Major depressive disorder, single episode, moderate: Secondary | ICD-10-CM

## 2023-09-12 NOTE — BH Specialist Note (Signed)
Integrated Behavioral Health via Telemedicine Visit  09/12/2023 Jane [name] [id number]  Number of Integrated Behavioral Health Clinician visits: Additional Visit  Session Start time: 1330   Session End time: 1430  Total time in minutes: 60   Referring Provider: PCP Patient/Family location: Work [facility] Provider location: Office All persons participating in visit: [facility] and Patient Types of Service: Individual psychotherapy and Telephone visit  I connected with Jane [name] via  Telephone Telemedicine Application  and verified that I am speaking with the correct person using two identifiers. Discussed confidentiality: Yes   I discussed the limitations of telemedicine and the availability of in person appointments.  Discussed there is a possibility of technology failure and discussed alternative modes of communication if that failure occurs.  I discussed that engaging in this telemedicine visit, they consent to the provision of behavioral healthcare and the services will be billed under their insurance.  Patient and/or legal guardian expressed understanding and consented to Telemedicine visit: Yes   Presenting Concerns: Patient and/or family reports the following symptoms/concerns:  The client has experienced heavy bleeding over the past week and believes she may have miscarried. This situation is compounded by her recent move to a new apartment at [employer], [address], which may add emotional stress during this challenging time. While she reports feeling stable in her employment and living situation, she still has concerns with her 3 year-old daughter who recently moved out due to conflict. She receives SSI and EBT benefits and states her basic needs are being met.   Patient and/or Family's Strengths/Protective Factors: Social connections  Goals Addressed: Patient will:  Reduce symptoms of: depression   Increase knowledge and/or ability of: coping skills    Demonstrate ability to: Increase healthy adjustment to current life circumstances  Progress towards Goals: Ongoing  Interventions: Interventions utilized:  CBT Cognitive Behavioral Therapy Standardized Assessments completed: Not Needed  Patient and/or Family Response: Patient agrees to ongoing therapy .  Assessment: Patient currently experiencing Depression.   Patient may benefit from Individual therapy.  Plan: Follow up with behavioral health clinician on : within 30 days.  I discussed the assessment and treatment plan with the patient and/or parent/guardian. They were provided an opportunity to ask questions and all were answered. They agreed with the plan and demonstrated an understanding of the instructions.   They were advised to call back or seek an in-person evaluation if the symptoms worsen or if the condition fails to improve as anticipated.  Christen Butter, MSW, LCSW-A She/Her Behavioral Health Clinician [facility]  Internal Medicine Center Direct Dial:[phone]  Fax [phone] Main Office Phone: [phone] [address] [place] [place], [place] [address] Website: [facility] Internal [facility]  [place], [place]  [place]

## 2023-09-13 ENCOUNTER — Emergency Department (HOSPITAL_COMMUNITY): Payer: 59

## 2023-09-13 ENCOUNTER — Emergency Department (HOSPITAL_COMMUNITY)
Admission: EM | Admit: 2023-09-13 | Discharge: 2023-09-13 | Disposition: A | Discharge status: Home / Self Care | Payer: 59 | Attending: Emergency Medicine | Admitting: Emergency Medicine

## 2023-09-13 ENCOUNTER — Other Ambulatory Visit: Payer: Self-pay

## 2023-09-13 ENCOUNTER — Encounter (HOSPITAL_COMMUNITY): Payer: Self-pay | Admitting: Emergency Medicine

## 2023-09-13 DIAGNOSIS — Z79899 Other long term (current) drug therapy: Secondary | ICD-10-CM | POA: Insufficient documentation

## 2023-09-13 DIAGNOSIS — I120 Hypertensive chronic kidney disease with stage 5 chronic kidney disease or end stage renal disease: Secondary | ICD-10-CM | POA: Insufficient documentation

## 2023-09-13 DIAGNOSIS — R0602 Shortness of breath: Secondary | ICD-10-CM | POA: Diagnosis present

## 2023-09-13 DIAGNOSIS — Z7901 Long term (current) use of anticoagulants: Secondary | ICD-10-CM | POA: Diagnosis not present

## 2023-09-13 DIAGNOSIS — N186 End stage renal disease: Secondary | ICD-10-CM | POA: Insufficient documentation

## 2023-09-13 DIAGNOSIS — Z992 Dependence on renal dialysis: Secondary | ICD-10-CM | POA: Insufficient documentation

## 2023-09-13 DIAGNOSIS — I10 Essential (primary) hypertension: Secondary | ICD-10-CM

## 2023-09-13 DIAGNOSIS — Z96659 Presence of unspecified artificial knee joint: Secondary | ICD-10-CM | POA: Insufficient documentation

## 2023-09-13 LAB — BASIC METABOLIC PANEL
Anion gap: 15 (ref 5–15)
BUN: 47 mg/dL — ABNORMAL HIGH (ref 6–20)
CO2: 27 mmol/L (ref 22–32)
Calcium: 9.9 mg/dL (ref 8.9–10.3)
Chloride: 99 mmol/L (ref 98–111)
Creatinine, Ser: 12.5 mg/dL — ABNORMAL HIGH (ref 0.44–1.00)
GFR, Estimated: 3 mL/min — ABNORMAL LOW (ref >60)
Glucose, Bld: 138 mg/dL — ABNORMAL HIGH (ref 70–99)
Potassium: 4.8 mmol/L (ref 3.5–5.1)
Sodium: 141 mmol/L (ref 135–145)

## 2023-09-13 LAB — CBC
HCT: 33.5 % — ABNORMAL LOW (ref 36.0–46.0)
Hemoglobin: 9.4 g/dL — ABNORMAL LOW (ref 12.0–15.0)
MCH: 25.7 pg — ABNORMAL LOW (ref 26.0–34.0)
MCHC: 28.1 g/dL — ABNORMAL LOW (ref 30.0–36.0)
MCV: 91.5 fL (ref 80.0–100.0)
Platelets: 287 10*3/uL (ref 150–400)
RBC: 3.66 MIL/uL — ABNORMAL LOW (ref 3.87–5.11)
RDW: 20.3 % — ABNORMAL HIGH (ref 11.5–15.5)
WBC: 5.7 10*3/uL (ref 4.0–10.5)
nRBC: 0.3 % — ABNORMAL HIGH (ref 0.0–0.2)

## 2023-09-13 LAB — TROPONIN I (HIGH SENSITIVITY)
Troponin I (High Sensitivity): 19 ng/L — ABNORMAL HIGH (ref <18)
Troponin I (High Sensitivity): 20 ng/L — ABNORMAL HIGH (ref <18)

## 2023-09-13 MED ORDER — OXYCODONE HCL 5 MG PO TABS
10.0000 mg | ORAL_TABLET | Freq: Once | ORAL | Status: AC
  Administered 2023-09-13: 10 mg via ORAL
  Filled 2023-09-13: qty 2

## 2023-09-13 MED ORDER — BENZONATATE 100 MG PO CAPS
100.0000 mg | ORAL_CAPSULE | Freq: Three times a day (TID) | ORAL | 0 refills | Status: DC | PRN
  Filled 2023-09-13: qty 21, 7d supply, fill #0
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
## 2023-09-13 NOTE — Discharge Instructions (Signed)
As we discussed, your labs are stable and your x-ray did not show any pneumonia.  I have prescribed Tessalon Perles as needed for cough.  Please call your dialysis center tomorrow to get dialyzed  Return to ER if you get worsening shortness of breath or chest pain
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
## 2023-09-13 NOTE — ED Notes (Signed)
I made charge nurse aware of patient blood pressure

## 2023-09-14 ENCOUNTER — Other Ambulatory Visit (HOSPITAL_COMMUNITY): Payer: Self-pay

## 2023-09-14 ENCOUNTER — Encounter (HOSPITAL_COMMUNITY): Payer: Self-pay

## 2023-09-14 MED ORDER — LEFLUNOMIDE 10 MG PO TABS
10.0000 mg | ORAL_TABLET | Freq: Every day | ORAL | 2 refills | Status: AC
  Filled 2023-09-14: qty 30, 30d supply, fill #0
  Filled 2024-01-29: qty 30, 30d supply, fill #1

## 2023-09-18 ENCOUNTER — Other Ambulatory Visit (HOSPITAL_COMMUNITY): Payer: Self-pay

## 2023-09-19 ENCOUNTER — Other Ambulatory Visit (HOSPITAL_COMMUNITY): Payer: Self-pay

## 2023-09-19 MED ORDER — LEFLUNOMIDE 10 MG PO TABS: 10.0000 mg | ORAL_TABLET | Freq: Every day | ORAL | 2 refills | Status: DC

## 2023-09-20 ENCOUNTER — Telehealth: Payer: Self-pay

## 2023-09-20 NOTE — Telephone Encounter (Signed)
-----   Message from Lorriane Shire sent at 09/04/2023  4:12 PM EDT ----- Regarding: schedule surgery Hi,  I would like to schedule for TLH, BS, cysto with an experienced laparoscopist with overnight observation, for AUB.  Thank you,  Ajewole

## 2023-09-20 NOTE — Telephone Encounter (Signed)
Called patient to schedule procedure w/ Dr. Briscoe Deutscher. Patient chose to have surgery on 11/13/23 @[facility] at 0730 and arrive at 5:30 am. Patient was provided pre-op instructions and surgery details over the phone

## 2023-09-25 ENCOUNTER — Ambulatory Visit (INDEPENDENT_AMBULATORY_CARE_PROVIDER_SITE_OTHER): Payer: 59 | Admitting: Obstetrics and Gynecology

## 2023-09-25 ENCOUNTER — Encounter: Payer: Self-pay | Admitting: Obstetrics and Gynecology

## 2023-09-25 VITALS — BP 187/79 | HR 89 | Ht 65.0 in | Wt 211.0 lb

## 2023-09-25 DIAGNOSIS — N939 Abnormal uterine and vaginal bleeding, unspecified: Secondary | ICD-10-CM | POA: Diagnosis not present

## 2023-09-25 NOTE — Progress Notes (Signed)
GYNECOLOGY VISIT  Patient name: Jane [name] MRN [id number]  Date of birth: [birth year] Chief Complaint:   Menstrual Problem  History:  Jane [name] is a 47 y.o. [id number] being seen today for follow up of AUB.  Discussed the use of AI scribe software for clinical note transcription with the patient, who gave verbal consent to proceed.  History of Present Illness   the patient has a history of continued abnormal bleeding following her endometrial ablation in 2023. She would like to proceed with definitive management. The patient, with a history of chronic kidney disease requiring dialysis, is scheduled for a hysterectomy. They previously underwent knee surgery, during which they experienced significant hypotension postoperatively. To avoid similar complications, the patient and their healthcare team are coordinating dialysis around the upcoming surgery.   The patient reports a small amount of urinary output, which is expected to be monitored postoperatively. They also mention a history of tubal ligation. The patient expresses readiness for the surgery and does not voice any additional concerns or questions at this time.         Past Medical History:  Diagnosis Date   Abnormal uterine bleeding (AUB)    Anxiety    Arthritis of knee    Left, Gel and cortisone injections @ [facility]   Asthma    prn inhaler   Bipolar disorder (HCC)    CAD S/P BMS PCI to prox LAD cardiologist--- dr Judithann Sauger LAD to Mid LAD lesion, 90% stenosed. Post intervention - Vision BMS 3.0 mm x 18 mm (~3.5 mm) there is a 0% residual stenosis. 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   left 2018; right 2019   Depression    Diabetic foot ulcer (HCC) 04/22/2019   Diabetic neuropathy (HCC)    feet   Diabetic retinopathy, nonproliferative, severe (HCC)    bilateral   Dyspnea    with too much fluid   ESRD  (end stage renal disease) on dialysis [facility])    ?chronic interstitial nephritis  ([facility]   H/O non-ST elevation myocardial infarction (NSTEMI) 03/2016   Found in 90% mid LAD lesion treated with bare-metal stent (BMS) PCI - vision BMS 3.0 mm x 18 mm   Heart murmur    History of blood transfusion    History of MRSA infection 2007   Hyperlipidemia    Hypertension    IDA (iron deficiency anemia)    Insulin dependent type 2 diabetes mellitus (HCC)    Iron deficiency anemia    takes iron supplement   Myocardial infarction [facility])    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   PAOD (peripheral arterial occlusive disease) (HCC)    vascular--- dr Imogene Burn   Pneumonia 11/08/2021   PONV (postoperative nausea and vomiting)    S/P arteriovenous (AV) fistula creation 07/2017   S/p bare metal coronary artery stent 03/31/2016   BMS x1  to pLAD   Sickle cell trait (HCC)    Ulcer of left foot due to type 2 diabetes mellitus (HCC) 06/03/2019    Past Surgical History:  Procedure Laterality Date   A/V FISTULAGRAM N/A 04/13/2021   Procedure: A/V FISTULAGRAM - Right Arm;  Surgeon: Nada Libman, MD;  [facility];  Service: Cardiovascular;  Laterality: N/A;   ACHILLES TENDON LENGTHENING Left 11/24/2016   Procedure: Left Achilles  tendon lengthening (open);  Surgeon: Toni Arthurs, MD;  Location: [facility];  Service: Orthopedics;  Laterality: Left;   AV FISTULA PLACEMENT Right 07/31/2017   Procedure: ARTERIOVENOUS BRACHIOCEPHALIC FISTULA CREATION RIGHT ARM;  Surgeon: Fransisco Hertz, MD;  Location[facility] OR;  Service: Vascular;  Laterality: Right;   CALCANEAL OSTEOTOMY Left 11/24/2016   Procedure: Left hindfoot osteotomy and fusion;  Surgeon: Toni Arthurs, MD;  Location: [facility];  Service: Orthopedics;  Laterality: Left;   CARDIAC CATHETERIZATION N/A 03/31/2016   Procedure: Left Heart Cath and Coronary Angiography;   Surgeon: Runell Gess, MD;  Location: [facility] INVASIVE CV LAB: 90% early mLAD, normal LV Fxn   CARDIAC CATHETERIZATION N/A 03/31/2016   Procedure: Coronary Stent Intervention;  Surgeon: Runell Gess, MD;  Location: [facility] LAB;  Service: Cardiovascular: PCI to mLAD BMS Vision 3.0 mm x 18 mm   CESAREAN SECTION  1997   CYSTOSCOPY WITH FULGERATION N/A 08/30/2022   Procedure: CYSTOSCOPY WITH FULGERATION, BIOPSY, LEFT RETROGRADE PYELOGRAM;  Surgeon: Noel Christmas, MD;  Location: [facility];  Service: Urology;  Laterality: N/A;   DILITATION & CURRETTAGE/HYSTROSCOPY WITH NOVASURE ABLATION N/A 09/15/2020   Procedure: DILATATION & CURETTAGE/HYSTEROSCOPY;  Surgeon: Willodean Rosenthal, MD;  Location: [facility];  Service: Gynecology;  Laterality: N/A;   DILITATION & CURRETTAGE/HYSTROSCOPY WITH NOVASURE ABLATION N/A 12/21/2021   Procedure: DILATATION & CURETTAGE/HYSTEROSCOPY WITH NOVASURE ABLATION/ IUD REMOVAL;  Surgeon: Willodean Rosenthal, MD;  Location: [facility];  Service: Gynecology;  Laterality: N/A;   FISTULA SUPERFICIALIZATION Right 11/15/2017   Procedure: FISTULA SUPERFICIALIZATION RIGHT BRACHIOCEPHALIC  RIGHT;  Surgeon: Fransisco Hertz, MD;  Location: [facility] OR;  Service: Vascular;  Laterality: Right;   FOOT SURGERY     multiple for charcot   PERIPHERAL VASCULAR BALLOON ANGIOPLASTY Right 04/13/2021   Procedure: PERIPHERAL VASCULAR BALLOON ANGIOPLASTY;  Surgeon: Nada Libman, MD;  [facility];  Service: Cardiovascular;  Laterality: Right;  arm fistula   TOTAL KNEE ARTHROPLASTY Left 07/19/2023   Procedure: TOTAL KNEE ARTHROPLASTY;  Surgeon: Samson Frederic, MD;  Location: [facility];  Service: Orthopedics;  Laterality: Left;  150   TRANSTHORACIC ECHOCARDIOGRAM  02/2021   EF 55 to 60%.  No R WMA.  Mild septal LVH with moderate posterior hypertrophy.  Normal RV size and function.  Normal RVP/PASP.Marland Kitchen  Trivial MR.   TUBAL LIGATION  1997    The  following portions of the patient's history were reviewed and updated as appropriate: allergies, current medications, past family history, past medical history, past social history, past surgical history and problem list.   Health Maintenance:   Last pap     Component Value Date/Time   DIAGPAP  03/08/2023 1536    - Negative for intraepithelial lesion or malignancy (NILM)   DIAGPAP  05/20/2020 0921    - Negative for intraepithelial lesion or malignancy (NILM)   DIAGPAP  08/29/2017 0000    NEGATIVE FOR INTRAEPITHELIAL LESIONS OR MALIGNANCY.   HPVHIGH Negative 03/08/2023 1536   HPVHIGH Negative 05/20/2020 0921   ADEQPAP  03/08/2023 1536    Satisfactory for evaluation; transformation zone component PRESENT.   ADEQPAP  05/20/2020 0921    Satisfactory for evaluation; transformation zone component PRESENT.   ADEQPAP  08/29/2017 0000    Satisfactory for evaluation  endocervical/transformation zone component PRESENT.    High Risk HPV: Positive  Adequacy:  Satisfactory for evaluation, transformation zone component PRESENT  Diagnosis:  Atypical squamous cells of undetermined significance (  ASC-US)  Review of Systems:  Pertinent items are noted in HPI. Comprehensive review of systems was otherwise negative.   Objective:  Physical Exam BP (!) 187/79   Pulse 89   Ht 5\' 5"  (1.651 m)   Wt 211 lb (95.7 kg)   BMI 35.11 kg/m    Physical Exam Vitals and nursing note reviewed.  Constitutional:      Appearance: Normal appearance.  HENT:     Head: Normocephalic and atraumatic.  Pulmonary:     Effort: Pulmonary effort is normal.  Skin:    General: Skin is warm and dry.  Neurological:     General: No focal deficit present.     Mental Status: She is alert.  Psychiatric:        Mood and Affect: Mood normal.        Behavior: Behavior normal.        Thought Content: Thought content normal.        Judgment: Judgment normal.     Labs and Imaging PATHOLOGY:  FINAL MICROSCOPIC  DIAGNOSIS:   A. ENDOMETRIUM, CURETTAGE:  Chronic endometritis  Marked progestational effect  Stromal breakdown consistent with bleeding  Negative for polyp, hyperplasia and carcinoma   PELVIC ULTRASOUND FINDINGS: Uterus   Measurements: 8.3 x 5.6 x 5.2 cm = volume: 125.1 mL. There is a 0.7 x 0.6 x 0.5 cm uterine fibroid in the anterior lower myometrium.   Endometrium   Thickness: 10.5 mm.  No focal abnormality visualized.   Right ovary   Measurements: 3.5 x 2 x 2.1 cm = volume: 7.4 mL. Normal appearance/no adnexal mass.   Left ovary   Measurements: 1.6 x 1.6 x 1.9 cm = volume: 2.6 mL. Normal appearance/no adnexal mass.   Other findings   No abnormal free fluid.   IMPRESSION: No acute sonographic abnormality. Small uterine fibroid.   Endometrium measures 10.5 mm. If bleeding remains unresponsive to hormonal or medical therapy, sonohysterogram should be considered for focal lesion work-up. (Ref: Radiological Reasoning: Algorithmic Workup of Abnormal Vaginal Bleeding with Endovaginal Sonography and Sonohysterography. AJR 2008; 782:N56-21)     Assessment & Plan:   Assessment and Plan    Hysterectomy Planned hysterectomy with potential risks discussed including bleeding, infection, and damage to other pelvic structures. Patient has no allergies to antibiotics. -Administer antibiotics preoperatively to reduce risk of infection. -Plan for overnight stay postoperatively for observation due to patient's history of dialysis and previous low blood pressure post-surgery. -Postoperative appointments scheduled at 2-3 weeks and 7-8 weeks post-surgery. -Advise patient on postoperative restrictions including no heavy lifting for 6-8 weeks and nothing in the vagina for 8-12 weeks.  Dialysis Patient on dialysis with a history of low blood pressure post-surgery. Surgery scheduled on a Monday, which is a dialysis day for the patient. Discussed the need to adjust dialysis schedule to  avoid too much time between sessions and to manage blood pressure. -Coordinate with nephrologist to adjust dialysis schedule around surgery. -Consider inpatient dialysis postoperatively for monitoring. -Patient to request Saturday dialysis session prior to surgery.  Informed Consent Discussed risks and benefits of surgery and anesthesia with the patient. Patient consents to resuscitation if needed and accepts the risk of blood transfusion if significant bleeding occurs. -Document informed consent.      Patient desires surgical management with TLH, BS, cysto.  The risks of surgery were discussed in detail with the patient including but not limited to: bleeding which may require transfusion or reoperation; infection which may require prolonged hospitalization or re-hospitalization  and antibiotic therapy; injury to bowel, bladder, ureters and major vessels or other surrounding organs which may lead to other procedures; formation of adhesions; need for additional procedures including laparotomy or subsequent procedures secondary to intraoperative injury or abnormal pathology; thromboembolic phenomenon; incisional problems and other postoperative or anesthesia complications.  Patient was told that the likelihood that her condition and symptoms will be treated effectively with this surgical management was high; the postoperative expectations were also discussed in detail. The patient also understands the alternative treatment options which were discussed in full. All questions were answered.     Lorriane Shire, MD Minimally Invasive Gynecologic Surgery Center for [facility], [facility]

## 2023-09-25 NOTE — Progress Notes (Signed)
Patient states bleeding is brown like old blood.  Patient states that she has not had BP meds for approx 9 weeks.  Armandina Stammer RN

## 2023-09-28 ENCOUNTER — Other Ambulatory Visit (HOSPITAL_COMMUNITY): Payer: Self-pay

## 2023-09-28 MED ORDER — OXYCODONE-ACETAMINOPHEN 10-325 MG PO TABS
1.0000 | ORAL_TABLET | Freq: Every day | ORAL | 0 refills | Status: DC
  Filled 2023-10-02 (×2): qty 150, 30d supply, fill #0

## 2023-10-02 ENCOUNTER — Other Ambulatory Visit: Payer: Self-pay

## 2023-10-02 ENCOUNTER — Other Ambulatory Visit (HOSPITAL_COMMUNITY): Payer: Self-pay

## 2023-10-04 ENCOUNTER — Other Ambulatory Visit (HOSPITAL_COMMUNITY): Payer: Self-pay

## 2023-10-04 MED ORDER — METOCLOPRAMIDE HCL 10 MG PO TABS
10.0000 mg | ORAL_TABLET | Freq: Every day | ORAL | 0 refills | Status: AC | PRN
  Filled 2023-10-04: qty 30, 30d supply, fill #0

## 2023-10-10 ENCOUNTER — Ambulatory Visit (INDEPENDENT_AMBULATORY_CARE_PROVIDER_SITE_OTHER): Payer: 59 | Admitting: Licensed Clinical Social Worker

## 2023-10-10 DIAGNOSIS — F321 Major depressive disorder, single episode, moderate: Secondary | ICD-10-CM | POA: Diagnosis not present

## 2023-10-10 NOTE — BH Specialist Note (Signed)
Integrated Behavioral Health via Telemedicine Visit  10/10/2023 Jane [name] [id number]  Number of Integrated Behavioral Health Clinician visits: Additional Visit  Session Start time: 1330   Session End time: 1430  Total time in minutes: 60   Referring Provider: PCP Patient/Family location: Home [facility] Provider location: Office All persons participating in visit: [facility] and Patient Types of Service: Individual psychotherapy and Telephone visit  I connected with Jane [name] via  Telephone and verified that I am speaking with the correct person using two identifiers. Discussed confidentiality: Yes   I discussed the limitations of telemedicine and the availability of in person appointments.  Discussed there is a possibility of technology failure and discussed alternative modes of communication if that failure occurs.  I discussed that engaging in this telemedicine visit, they consent to the provision of behavioral healthcare and the services will be billed under their insurance.  Patient and/or legal guardian expressed understanding and consented to Telemedicine visit: Yes   Presenting Concerns: Patient and/or family reports the following symptoms/concerns: During the recent clinical follow-up session, the patient expressed significant distress regarding ongoing issues with her apartment since signing the lease on October 14. She reported that the electric service had been interrupted, which compounded her frustrations with the leasing office, as she felt her concerns were not being adequately addressed. The patient mentioned that she had already contacted the city code enforcement to report these issues and had initiated an official complaint due to the lack of response from her landlord. Throughout the session, she utilized the time to vent about multiple problems she has encountered, including inadequate heating and persistent maintenance issues that have not been resolved  despite her repeated requests for assistance. The patient articulated feelings of helplessness and frustration, emphasizing how these unresolved issues have impacted her quality of life. She noted that her attempts to communicate with the leasing office have often led to conflicts, further exacerbating her anxiety. The discussion also covered her decision to escalate the matter by filing a formal complaint with city officials. The patient expressed hope that this action would prompt the leasing office to take her concerns more seriously and lead to necessary repairs and improvements in her living conditions. Overall, the session served as a crucial outlet for her to process these challenges while considering her next steps in addressing her housing situation effectively. Duration of problem: less than a year; Severity of problem: moderate  Patient and/or Family's Strengths/Protective Factors: Social connections  Goals Addressed: Patient will:  Reduce symptoms of: depression   Increase knowledge and/or ability of: coping skills   Demonstrate ability to: Increase healthy adjustment to current life circumstances  Progress towards Goals: Ongoing  Interventions: Interventions utilized:  CBT Cognitive Behavioral Therapy Standardized Assessments completed: Not Needed  Patient and/or Family Response: Patient agrees to service  Assessment: Patient currently experiencing Depression.   Patient may benefit from Individual therapy.  Plan: Follow up with behavioral health clinician on : within 60 days   I discussed the assessment and treatment plan with the patient and/or parent/guardian. They were provided an opportunity to ask questions and all were answered. They agreed with the plan and demonstrated an understanding of the instructions.   They were advised to call back or seek an in-person evaluation if the symptoms worsen or if the condition fails to improve as anticipated.  Christen Butter,  MSW, LCSW-A She/Her Behavioral Health Clinician [facility]  Internal Medicine Center Direct [phone]  Fax [phone] Main Office Phone: [phone] [address]  [address], [place], [place] [address] Website: [facility] Internal Medicine [facility]  [place], [place]  [facility]

## 2023-10-16 ENCOUNTER — Other Ambulatory Visit (HOSPITAL_COMMUNITY): Payer: Self-pay

## 2023-10-31 ENCOUNTER — Other Ambulatory Visit (HOSPITAL_COMMUNITY): Payer: Self-pay

## 2023-10-31 MED ORDER — OXYCODONE-ACETAMINOPHEN 10-325 MG PO TABS
1.0000 | ORAL_TABLET | Freq: Every day | ORAL | 0 refills | Status: DC
  Filled 2023-10-31: qty 150, 30d supply, fill #0

## 2023-11-01 ENCOUNTER — Encounter (HOSPITAL_COMMUNITY): Payer: Self-pay

## 2023-11-01 ENCOUNTER — Other Ambulatory Visit (HOSPITAL_COMMUNITY): Payer: Self-pay

## 2023-11-03 NOTE — Progress Notes (Addendum)
Surgical Instructions   Your procedure is scheduled on December 30, 24. Report to [facility] Main Entrance "A" at 5:30 A.M., then check in with the Admitting office. Any questions or running late day of surgery: call [phone]  Questions prior to your surgery date: call [phone], Monday-Friday, 8am-4pm. If you experience any cold or flu symptoms such as cough, fever, chills, shortness of breath, etc. between now and your scheduled surgery, please notify us at the above number.     Remember:  Do not eat after midnight the night before your surgery   You may drink clear liquids until 4:30 the morning of your surgery.   Clear liquids allowed are: Water, Non-Citrus Juices (without pulp), Carbonated Beverages, Clear Tea (no milk, honey, etc.), Black Coffee Only (NO MILK, CREAM OR POWDERED CREAMER of any kind), and Gatorade.    Take these medicines the morning of surgery with A SIP OF WATER  cetirizine (ZYRTEC)  dicyclomine (BENTYL)  DULoxetine (CYMBALTA)  gabapentin (NEURONTIN)  lanthanum (FOSRENOL)  metoprolol tartrate (LOPRESSOR)  rosuvastatin (CRESTOR)    May take these medicines IF NEEDED: acetaminophen (TYLENOL)  albuterol (PROVENTIL HFA) inhaler benzonatate (TESSALON)  methocarbamol (ROBAXIN)  metoCLOPramide (REGLAN)  oxyCODONE-acetaminophen  promethazine (PHENERGAN)    Follow your physician's instructions on when to stop taking your apixaban (ELIQUIS). If you did not receive instructions contact your physician to see when you should stop taking your apixaban Everlene Balls).   WHAT DO I DO ABOUT MY DIABETES MEDICATION?   The NIGHT BEFORE YOUR SURGERY,  only take 12 units of your [place].   THE NIGHT BEFORE SURGERY, do not take your bedtime dose of your insulin lispro (HUMALOG KWIKPEN). You may take your regular meal time correction dose of insulin lispro (HUMALOG KWIKPEN) on 11/12/23.       THE MORNING OF SURGERY, do not take your dose of insulin lispro (HUMALOG  KWIKPEN) .  If your CBG is greater than 220 mg/dL, you may take  of your sliding scale (correction) dose of insulin.   HOW TO MANAGE YOUR DIABETES BEFORE AND AFTER SURGERY  Why is it important to control my blood sugar before and after surgery? Improving blood sugar levels before and after surgery helps healing and can limit problems. A way of improving blood sugar control is eating a healthy diet by:  Eating less sugar and carbohydrates  Increasing activity/exercise  Talking with your doctor about reaching your blood sugar goals High blood sugars (greater than 180 mg/dL) can raise your risk of infections and slow your recovery, so you will need to focus on controlling your diabetes during the weeks before surgery. Make sure that the doctor who takes care of your diabetes knows about your planned surgery including the date and location.  How do I manage my blood sugar before surgery? Check your blood sugar at least 4 times a day, starting 2 days before surgery, to make sure that the level is not too high or low.  Check your blood sugar the morning of your surgery when you wake up and every 2 hours until you get to the Short Stay unit.  If your blood sugar is less than 70 mg/dL, you will need to treat for low blood sugar: Do not take insulin. Treat a low blood sugar (less than 70 mg/dL) with  cup of clear juice (cranberry or apple), 4 glucose tablets, OR glucose gel. Recheck blood sugar in 15 minutes after treatment (to make sure it is greater than 70 mg/dL). If your blood  sugar is not greater than 70 mg/dL on recheck, call [phone] for further instructions. Report your blood sugar to the short stay nurse when you get to Short Stay.  If you are admitted to the hospital after surgery: Your blood sugar will be checked by the staff and you will probably be given insulin after surgery (instead of oral diabetes medicines) to make sure you have good blood sugar levels. The goal for blood  sugar control after surgery is 80-180 mg/dL.   One week prior to surgery, STOP taking any Aspirin (unless otherwise instructed by your surgeon) Aleve, Naproxen, Ibuprofen, Motrin, Advil, Goody's, BC's, all herbal medications, fish oil, and non-prescription vitamins.                     Do NOT Smoke (Tobacco/Vaping) for 24 hours prior to your procedure.  If you use a CPAP at night, you may bring your mask/headgear for your overnight stay.   You will be asked to remove any contacts, glasses, piercing's, hearing aid's, dentures/partials prior to surgery. Please bring cases for these items if needed.    Patients discharged the day of surgery will not be allowed to drive home, and someone needs to stay with them for 24 hours.  SURGICAL WAITING ROOM VISITATION Patients may have no more than 2 support people in the waiting area - these visitors may rotate.   Pre-op nurse will coordinate an appropriate time for 1 ADULT support person, who may not rotate, to accompany patient in pre-op.  Children under the age of 45 must have an adult with them who is not the patient and must remain in the main waiting area with an adult.  If the patient needs to stay at the hospital during part of their recovery, the visitor guidelines for inpatient rooms apply.  Please refer to the [facility] website for the visitor guidelines for any additional information.   If you received a COVID test during your pre-op visit  it is requested that you wear a mask when out in public, stay away from anyone that may not be feeling well and notify your surgeon if you develop symptoms. If you have been in contact with anyone that has tested positive in the last 10 days please notify you surgeon.      Pre-operative CHG Bathing Instructions   You can play a key role in reducing the risk of infection after surgery. Your skin needs to be as free of germs as possible. You can reduce the number of germs on your skin by washing with  CHG (chlorhexidine gluconate) soap before surgery. CHG is an antiseptic soap that kills germs and continues to kill germs even after washing.   DO NOT use if you have an allergy to chlorhexidine/CHG or antibacterial soaps. If your skin becomes reddened or irritated, stop using the CHG and notify one of our RNs at [phone].              TAKE A SHOWER THE NIGHT BEFORE SURGERY AND THE DAY OF SURGERY    Please keep in mind the following:  DO NOT shave, including legs and underarms, 48 hours prior to surgery.   You may shave your face before/day of surgery.  Place clean sheets on your bed the night before surgery Use a clean washcloth (not used since being washed) for each shower. DO NOT sleep with pet's night before surgery.  CHG Shower Instructions:  Wash your face and private area with normal soap.  If you choose to wash your hair, wash first with your normal shampoo.  After you use shampoo/soap, rinse your hair and body thoroughly to remove shampoo/soap residue.  Turn the water OFF and apply half the bottle of CHG soap to a CLEAN washcloth.  Apply CHG soap ONLY FROM YOUR NECK DOWN TO YOUR TOES (washing for 3-5 minutes)  DO NOT use CHG soap on face, private areas, open wounds, or sores.  Pay special attention to the area where your surgery is being performed.  If you are having back surgery, having someone wash your back for you may be helpful. Wait 2 minutes after CHG soap is applied, then you may rinse off the CHG soap.  Pat dry with a clean towel  Put on clean pajamas    Additional instructions for the day of surgery: DO NOT APPLY any lotions, deodorants, cologne, or perfumes.   Do not wear jewelry or makeup Do not wear nail polish, gel polish, artificial nails, or any other type of covering on natural nails (fingers and toes) Do not bring valuables to the hospital. [facility] is not responsible for valuables/personal belongings. Put on clean/comfortable clothes.  Please brush  your teeth.  Ask your nurse before applying any prescription medications to the skin.

## 2023-11-06 ENCOUNTER — Encounter (HOSPITAL_COMMUNITY): Payer: Self-pay

## 2023-11-06 ENCOUNTER — Encounter (HOSPITAL_COMMUNITY)
Admission: RE | Admit: 2023-11-06 | Discharge: 2023-11-06 | Disposition: A | Discharge status: Home / Self Care | Payer: 59 | Source: Ambulatory Visit | Attending: Obstetrics and Gynecology | Admitting: Obstetrics and Gynecology

## 2023-11-06 ENCOUNTER — Ambulatory Visit (INDEPENDENT_AMBULATORY_CARE_PROVIDER_SITE_OTHER): Payer: 59 | Admitting: Licensed Clinical Social Worker

## 2023-11-06 ENCOUNTER — Other Ambulatory Visit: Payer: Self-pay

## 2023-11-06 VITALS — BP 198/102 | HR 92 | Temp 99.2°F | Resp 18 | Ht 65.0 in | Wt 206.5 lb

## 2023-11-06 DIAGNOSIS — I2510 Atherosclerotic heart disease of native coronary artery without angina pectoris: Secondary | ICD-10-CM | POA: Insufficient documentation

## 2023-11-06 DIAGNOSIS — N186 End stage renal disease: Secondary | ICD-10-CM | POA: Insufficient documentation

## 2023-11-06 DIAGNOSIS — Z01818 Encounter for other preprocedural examination: Secondary | ICD-10-CM

## 2023-11-06 DIAGNOSIS — I252 Old myocardial infarction: Secondary | ICD-10-CM | POA: Insufficient documentation

## 2023-11-06 DIAGNOSIS — E11319 Type 2 diabetes mellitus with unspecified diabetic retinopathy without macular edema: Secondary | ICD-10-CM | POA: Diagnosis not present

## 2023-11-06 DIAGNOSIS — F319 Bipolar disorder, unspecified: Secondary | ICD-10-CM | POA: Diagnosis not present

## 2023-11-06 DIAGNOSIS — I132 Hypertensive heart and chronic kidney disease with heart failure and with stage 5 chronic kidney disease, or end stage renal disease: Secondary | ICD-10-CM | POA: Insufficient documentation

## 2023-11-06 DIAGNOSIS — Z992 Dependence on renal dialysis: Secondary | ICD-10-CM | POA: Diagnosis not present

## 2023-11-06 DIAGNOSIS — Z87891 Personal history of nicotine dependence: Secondary | ICD-10-CM | POA: Diagnosis not present

## 2023-11-06 DIAGNOSIS — Z79899 Other long term (current) drug therapy: Secondary | ICD-10-CM | POA: Insufficient documentation

## 2023-11-06 DIAGNOSIS — Z794 Long term (current) use of insulin: Secondary | ICD-10-CM | POA: Diagnosis not present

## 2023-11-06 DIAGNOSIS — J45909 Unspecified asthma, uncomplicated: Secondary | ICD-10-CM | POA: Diagnosis not present

## 2023-11-06 DIAGNOSIS — E1140 Type 2 diabetes mellitus with diabetic neuropathy, unspecified: Secondary | ICD-10-CM | POA: Diagnosis not present

## 2023-11-06 DIAGNOSIS — E875 Hyperkalemia: Secondary | ICD-10-CM | POA: Insufficient documentation

## 2023-11-06 DIAGNOSIS — F321 Major depressive disorder, single episode, moderate: Secondary | ICD-10-CM

## 2023-11-06 DIAGNOSIS — E1122 Type 2 diabetes mellitus with diabetic chronic kidney disease: Secondary | ICD-10-CM | POA: Insufficient documentation

## 2023-11-06 DIAGNOSIS — D631 Anemia in chronic kidney disease: Secondary | ICD-10-CM | POA: Insufficient documentation

## 2023-11-06 DIAGNOSIS — Z01812 Encounter for preprocedural laboratory examination: Secondary | ICD-10-CM | POA: Diagnosis present

## 2023-11-06 DIAGNOSIS — Z955 Presence of coronary angioplasty implant and graft: Secondary | ICD-10-CM | POA: Diagnosis not present

## 2023-11-06 DIAGNOSIS — D573 Sickle-cell trait: Secondary | ICD-10-CM | POA: Insufficient documentation

## 2023-11-06 DIAGNOSIS — I5032 Chronic diastolic (congestive) heart failure: Secondary | ICD-10-CM | POA: Insufficient documentation

## 2023-11-06 DIAGNOSIS — N939 Abnormal uterine and vaginal bleeding, unspecified: Secondary | ICD-10-CM

## 2023-11-06 LAB — HEMOGLOBIN A1C
Hgb A1c MFr Bld: 6.9 % — ABNORMAL HIGH (ref 4.8–5.6)
Mean Plasma Glucose: 151.33 mg/dL

## 2023-11-06 LAB — CBC
HCT: 39.4 % (ref 36.0–46.0)
Hemoglobin: 11.6 g/dL — ABNORMAL LOW (ref 12.0–15.0)
MCH: 24.6 pg — ABNORMAL LOW (ref 26.0–34.0)
MCHC: 29.4 g/dL — ABNORMAL LOW (ref 30.0–36.0)
MCV: 83.7 fL (ref 80.0–100.0)
Platelets: 286 10*3/uL (ref 150–400)
RBC: 4.71 MIL/uL (ref 3.87–5.11)
RDW: 17.1 % — ABNORMAL HIGH (ref 11.5–15.5)
WBC: 5.5 10*3/uL (ref 4.0–10.5)
nRBC: 0 % (ref 0.0–0.2)

## 2023-11-06 LAB — BASIC METABOLIC PANEL
Anion gap: 18 — ABNORMAL HIGH (ref 5–15)
BUN: 65 mg/dL — ABNORMAL HIGH (ref 6–20)
CO2: 24 mmol/L (ref 22–32)
Calcium: 10.3 mg/dL (ref 8.9–10.3)
Chloride: 95 mmol/L — ABNORMAL LOW (ref 98–111)
Creatinine, Ser: 14.82 mg/dL — ABNORMAL HIGH (ref 0.44–1.00)
GFR, Estimated: 3 mL/min — ABNORMAL LOW (ref >60)
Glucose, Bld: 183 mg/dL — ABNORMAL HIGH (ref 70–99)
Potassium: 5.7 mmol/L — ABNORMAL HIGH (ref 3.5–5.1)
Sodium: 137 mmol/L (ref 135–145)

## 2023-11-06 LAB — SURGICAL PCR SCREEN
MRSA, PCR: NEGATIVE
Staphylococcus aureus: NEGATIVE

## 2023-11-06 LAB — TYPE AND SCREEN
ABO/RH(D): O POS
Antibody Screen: NEGATIVE

## 2023-11-06 LAB — GLUCOSE, CAPILLARY: Glucose-Capillary: 158 mg/dL — ABNORMAL HIGH (ref 70–99)

## 2023-11-06 NOTE — Progress Notes (Addendum)
PCP - Internal Medicine Clinic at [facility] Cardiologist - Dr Herbie [place] Nephrology- [place]- Dr Marisue Humble Rheumatology- [facility]- Dr Deanne Coffer  PPM/ICD - denies   Chest x-ray - N/A EKG - 09/18/23 Stress Test -  09/13/18 ECHO - 9/824 Cardiac Cath - 03/31/16   Sleep Study - denies   Fasting Blood Sugar - Pt wears Dexcom CGM- Typically 115-140 per patient.    Blood Thinner Instructions: pt not taking any blood thinners.    ERAS Protcol - ERAS per order no drink   COVID TEST- N/A  Pt states she is holding Arava 2 weeks prior to surgery per rheumatologist.   Anesthesia review: yes- cardiac history- pt reports that he clearance for knee surgery was also her clearance for her hysterectomy.   BP 200/100. Pt did not go to HD yesterday, but did go Friday. She is normally MWF, but schedules this week will be Tue, Fri, Sunday d/t the holidays. She reports that her BP is normally 120-150 systolic at home with diastolic under 100. Pt does not take any blood pressure medications anymore. BP 198/102 at PAT. Pt asymptomatic. Pt agreed to keep monitoring her BP at home. She will go to HD tomorrow. James with anesthesia notified while pt at PAT.   Patient denies shortness of breath, fever, cough and chest pain at PAT appointment   All instructions explained to the patient, with a verbal understanding of the material. Patient agrees to go over the instructions while at home for a better understanding. The opportunity to ask questions was provided.

## 2023-11-06 NOTE — Progress Notes (Signed)
Surgical Instructions   Your procedure is scheduled on December 30, 24. Report to [facility] Main Entrance "A" at 5:30 A.M., then check in with the Admitting office. Any questions or running late day of surgery: call [phone]  Questions prior to your surgery date: call [phone], Monday-Friday, 8am-4pm. If you experience any cold or flu symptoms such as cough, fever, chills, shortness of breath, etc. between now and your scheduled surgery, please notify us at the above number.     Remember:  Do not eat after midnight the night before your surgery   You may drink clear liquids until 4:30 the morning of your surgery.   Clear liquids allowed are: Water, Non-Citrus Juices (without pulp), Carbonated Beverages, Clear Tea (no milk, honey, etc.), Black Coffee Only (NO MILK, CREAM OR POWDERED CREAMER of any kind), and Gatorade.    Take these medicines the morning of surgery with A SIP OF WATER  cetirizine (ZYRTEC)  dicyclomine (BENTYL)  DULoxetine (CYMBALTA)  rosuvastatin (CRESTOR)    May take these medicines IF NEEDED: acetaminophen (TYLENOL)  albuterol (PROVENTIL HFA) inhaler benzonatate (TESSALON)  methocarbamol (ROBAXIN)  metoCLOPramide (REGLAN)  oxyCODONE-acetaminophen  promethazine (PHENERGAN)     WHAT DO I DO ABOUT MY DIABETES MEDICATION?   The NIGHT BEFORE YOUR SURGERY,  only take 12 units of your [place].   THE NIGHT BEFORE SURGERY, do not take your bedtime dose of your insulin lispro (HUMALOG KWIKPEN). You may take your regular meal time correction dose of insulin lispro (HUMALOG KWIKPEN) on 11/12/23.       THE MORNING OF SURGERY, do not take your dose of insulin lispro (HUMALOG KWIKPEN) .  If your CBG is greater than 220 mg/dL, you may take  of your sliding scale (correction) dose of insulin.   HOW TO MANAGE YOUR DIABETES BEFORE AND AFTER SURGERY  Why is it important to control my blood sugar before and after surgery? Improving blood sugar levels before  and after surgery helps healing and can limit problems. A way of improving blood sugar control is eating a healthy diet by:  Eating less sugar and carbohydrates  Increasing activity/exercise  Talking with your doctor about reaching your blood sugar goals High blood sugars (greater than 180 mg/dL) can raise your risk of infections and slow your recovery, so you will need to focus on controlling your diabetes during the weeks before surgery. Make sure that the doctor who takes care of your diabetes knows about your planned surgery including the date and location.  How do I manage my blood sugar before surgery? Check your blood sugar at least 4 times a day, starting 2 days before surgery, to make sure that the level is not too high or low.  Check your blood sugar the morning of your surgery when you wake up and every 2 hours until you get to the Short Stay unit.  If your blood sugar is less than 70 mg/dL, you will need to treat for low blood sugar: Do not take insulin. Treat a low blood sugar (less than 70 mg/dL) with  cup of clear juice (cranberry or apple), 4 glucose tablets, OR glucose gel. Recheck blood sugar in 15 minutes after treatment (to make sure it is greater than 70 mg/dL). If your blood sugar is not greater than 70 mg/dL on recheck, call [phone] for further instructions. Report your blood sugar to the short stay nurse when you get to Short Stay.  If you are admitted to the hospital after surgery: Your blood sugar  will be checked by the staff and you will probably be given insulin after surgery (instead of oral diabetes medicines) to make sure you have good blood sugar levels. The goal for blood sugar control after surgery is 80-180 mg/dL.   One week prior to surgery, STOP taking any Aspirin (unless otherwise instructed by your surgeon) Aleve, Naproxen, Ibuprofen, Motrin, Advil, Goody's, BC's, all herbal medications, fish oil, and non-prescription vitamins.                      Do NOT Smoke (Tobacco/Vaping) for 24 hours prior to your procedure.  If you use a CPAP at night, you may bring your mask/headgear for your overnight stay.   You will be asked to remove any contacts, glasses, piercing's, hearing aid's, dentures/partials prior to surgery. Please bring cases for these items if needed.    Patients discharged the day of surgery will not be allowed to drive home, and someone needs to stay with them for 24 hours.  SURGICAL WAITING ROOM VISITATION Patients may have no more than 2 support people in the waiting area - these visitors may rotate.   Pre-op nurse will coordinate an appropriate time for 1 ADULT support person, who may not rotate, to accompany patient in pre-op.  Children under the age of 74 must have an adult with them who is not the patient and must remain in the main waiting area with an adult.  If the patient needs to stay at the hospital during part of their recovery, the visitor guidelines for inpatient rooms apply.  Please refer to the [facility] website for the visitor guidelines for any additional information.   If you received a COVID test during your pre-op visit  it is requested that you wear a mask when out in public, stay away from anyone that may not be feeling well and notify your surgeon if you develop symptoms. If you have been in contact with anyone that has tested positive in the last 10 days please notify you surgeon.      Pre-operative CHG Bathing Instructions   You can play a key role in reducing the risk of infection after surgery. Your skin needs to be as free of germs as possible. You can reduce the number of germs on your skin by washing with CHG (chlorhexidine gluconate) soap before surgery. CHG is an antiseptic soap that kills germs and continues to kill germs even after washing.   DO NOT use if you have an allergy to chlorhexidine/CHG or antibacterial soaps. If your skin becomes reddened or irritated, stop using the CHG  and notify one of our RNs at [phone].              TAKE A SHOWER THE NIGHT BEFORE SURGERY AND THE DAY OF SURGERY    Please keep in mind the following:  DO NOT shave, including legs and underarms, 48 hours prior to surgery.   You may shave your face before/day of surgery.  Place clean sheets on your bed the night before surgery Use a clean washcloth (not used since being washed) for each shower. DO NOT sleep with pet's night before surgery.  CHG Shower Instructions:  Wash your face and private area with normal soap. If you choose to wash your hair, wash first with your normal shampoo.  After you use shampoo/soap, rinse your hair and body thoroughly to remove shampoo/soap residue.  Turn the water OFF and apply half the bottle of CHG soap to  a Animal nutritionist.  Apply CHG soap ONLY FROM YOUR NECK DOWN TO YOUR TOES (washing for 3-5 minutes)  DO NOT use CHG soap on face, private areas, open wounds, or sores.  Pay special attention to the area where your surgery is being performed.  If you are having back surgery, having someone wash your back for you may be helpful. Wait 2 minutes after CHG soap is applied, then you may rinse off the CHG soap.  Pat dry with a clean towel  Put on clean pajamas    Additional instructions for the day of surgery: DO NOT APPLY any lotions, deodorants, cologne, or perfumes.   Do not wear jewelry or makeup Do not wear nail polish, gel polish, artificial nails, or any other type of covering on natural nails (fingers and toes) Do not bring valuables to the hospital. [facility] is not responsible for valuables/personal belongings. Put on clean/comfortable clothes.  Please brush your teeth.  Ask your nurse before applying any prescription medications to the skin.

## 2023-11-06 NOTE — BH Specialist Note (Signed)
Integrated Behavioral Health via Telemedicine Visit  11/06/2023 Jane [name] [id number]  Number of Integrated Behavioral Health Clinician visits: Additional Visit  Session Start time: 1330   Session End time: 1354  Total time in minutes: 24   Referring Provider: PCP Patient/Family location: Work [facility] Provider location: Office All persons participating in visit: [facility] and Patient Types of Service: Individual psychotherapy  I connected with Jane [name]  via  Telephone and verified that I am speaking with the correct person using two identifiers. Discussed confidentiality: Yes   I discussed the limitations of telemedicine and the availability of in person appointments.  Discussed there is a possibility of technology failure and discussed alternative modes of communication if that failure occurs.  I discussed that engaging in this telemedicine visit, they consent to the provision of behavioral healthcare and the services will be billed under their insurance.  Patient and/or legal guardian expressed understanding and consented to Telemedicine visit: Yes   Presenting Concerns: Patient and/or family reports the following symptoms/concerns: Patient was currently at work and extremly busy. Patient was unable to talk, but mentioned her anxiety related to her procedure on 12/30. [facility] took a brief moment to do some deep breathing exercises. [facility] helped process patients feelings and informed patient that I will be her back on the schedule.    Patient and/or Family's Strengths/Protective Factors: Social connections  Goals Addressed: Patient will:  Reduce symptoms of: anxiety   Increase knowledge and/or ability of: coping skills   Demonstrate ability to: Increase healthy adjustment to current life circumstances  Progress towards Goals: Ongoing  Interventions: Interventions utilized:  Motivational Interviewing Standardized Assessments completed: Not Needed  Patient and/or  Family Response: Agrees to services  Assessment: Patient currently experiencing Anxiety.   Patient may benefit from Ongoing therapy.  Plan: Follow up with behavioral health clinician on : With 30 days   I discussed the assessment and treatment plan with the patient and/or parent/guardian. They were provided an opportunity to ask questions and all were answered. They agreed with the plan and demonstrated an understanding of the instructions.   They were advised to call back or seek an in-person evaluation if the symptoms worsen or if the condition fails to improve as anticipated.  Christen Butter, MSW, LCSW-A She/Her Behavioral Health Clinician [facility]  Internal Medicine Center Direct [phone]  Fax [phone] Main Office Phone: [phone] [address] [place] [place], [place] [address] Website: [facility] Internal Medicine [facility]  [place], [place]  [place]

## 2023-11-06 NOTE — Anesthesia Preprocedure Evaluation (Addendum)
Anesthesia Evaluation  Patient identified by MRN, date of birth, ID band Patient awake    Reviewed: Allergy & Precautions, NPO status , Patient's Chart, lab work & pertinent test results  History of Anesthesia Complications (+) PONV and history of anesthetic complications  Airway Mallampati: II  TM Distance: >3 FB Neck ROM: Full    Dental  (+) Teeth Intact   Pulmonary asthma , former smoker   breath sounds clear to auscultation       Cardiovascular hypertension, + CAD, + Past MI, + Cardiac Stents, + Peripheral Vascular Disease and +CHF  + Valvular Problems/Murmurs  Rhythm:Regular Rate:Normal  Echo:   1. Left ventricular ejection fraction, by estimation, is 60 to 65%. The  left ventricle has normal function. The left ventricle has no regional  wall motion abnormalities. There is mild left ventricular hypertrophy.  Left ventricular diastolic parameters  were normal.   2. Right ventricular systolic function is normal. The right ventricular  size is normal. There is normal pulmonary artery systolic pressure.   3. The mitral valve is normal in structure. No evidence of mitral valve  regurgitation. No evidence of mitral stenosis.   4. The aortic valve is tricuspid. Aortic valve regurgitation is not  visualized. No aortic stenosis is present.   5. The inferior vena cava is normal in size with greater than 50%  respiratory variability, suggesting right atrial pressure of 3 mmHg.     Neuro/Psych  PSYCHIATRIC DISORDERS Anxiety Depression Bipolar Disorder    Neuromuscular disease    GI/Hepatic negative GI ROS, Neg liver ROS,,,  Endo/Other  diabetes    Renal/GU Renal disease     Musculoskeletal   Abdominal   Peds  Hematology  (+) Blood dyscrasia, Sickle cell trait and anemia   Anesthesia Other Findings   Reproductive/Obstetrics                             Anesthesia Physical Anesthesia  Plan  ASA: 3  Anesthesia Plan: General   Post-op Pain Management: Tylenol PO (pre-op)* and Toradol IV (intra-op)*   Induction: Intravenous  PONV Risk Score and Plan: 4 or greater and Ondansetron, Dexamethasone, Midazolam and Scopolamine patch - Pre-op  Airway Management Planned: Oral ETT  Additional Equipment: None  Intra-op Plan:   Post-operative Plan: Extubation in OR  Informed Consent: I have reviewed the patients History and Physical, chart, labs and discussed the procedure including the risks, benefits and alternatives for the proposed anesthesia with the patient or authorized representative who has indicated his/her understanding and acceptance.     Dental advisory given  Plan Discussed with:   Anesthesia Plan Comments: (PAT note by Antionette Poles, PA-C: 47 yo female with pertinent hx including former smoker (quit 02/28/15), PONV, HTN, CAD (BMS PCI to prox LAD in setting of NSTEMI 03/31/16), murmur (trivial MR/TR 02/2021 echo), chronic diastolic heart failure, IDDM (with retinopathy, neuropathy, nephropathy), ESRD on HD, Bipolar disorder, Sickle cell trait, anemia, asthma.  Last cardiology office follow-up was on 06/14/23 with Melina Modena, NP for CAD follow-up and preoperative evaluation. GDMT limited by ESRD. Continue metoprolol. No new changes. Six month follow-up planned. Preoperative cardiology input most recently outlined by Edd Fabian, NP on 07/03/23: "Chart reviewed as part of pre-operative protocol coverage. Given past medical history and time since last visit, based on ACC/AHA guidelines, Jane [name] would be at acceptable risk for the planned procedure without further cardiovascular testing. She is able to exceed 4  METS of activity. Her RCRI is a Class IV risk with a 15.0%."  Echo 07/23/2023 showed EF 60 to 65%, normal RV systolic function, normal valves.  Zio monitor in September 2022 was normal. Low risk stress test 09/13/18.  Blood pressure noted to be  elevated at preop testing, 200/100.  This is likely due to in part to fluid overload as she missed her HD session on 11/05/2023.  She normally dialyzes on Mondays Wednesdays and Fridays, but states she will be dialyzing Tuesday Friday Sunday due to the holidays.  She is instructed to monitor her blood pressure at home and to reach out to nephrologist who manages her hypertension.  Preop CBC reviewed, mild chronic anemia with hemoglobin 11.6, otherwise unremarkable.  BMP consistent with ESRD with creatinine 14.82, potassium elevated at 5.7 which is expected given her recently missed dialysis session.  EKG 09/13/2023: Sinus tachycardia.  Rate 112.  Probable LAE.  Borderline right axis deviation.  TTE 07/23/2023: 1. Left ventricular ejection fraction, by estimation, is 60 to 65%. The  left ventricle has normal function. The left ventricle has no regional  wall motion abnormalities. There is mild left ventricular hypertrophy.  Left ventricular diastolic parameters  were normal.  2. Right ventricular systolic function is normal. The right ventricular  size is normal. There is normal pulmonary artery systolic pressure.  3. The mitral valve is normal in structure. No evidence of mitral valve  regurgitation. No evidence of mitral stenosis.  4. The aortic valve is tricuspid. Aortic valve regurgitation is not  visualized. No aortic stenosis is present.  5. The inferior vena cava is normal in size with greater than 50%  respiratory variability, suggesting right atrial pressure of 3 mmHg.   Long Term Zio Monitor 07/27/2021 - 07/30/2021:  Zio patch worn for 3 days, 5 hours (September 13-16, 2022)  Patient was in sinus rhythm during entire recording duration. Heart rate range 69 to 119 bpm with an average of 89 bpm.  Rare PACs and PVCs with rare couplets above. No ventricular bigeminy or trigeminy noted.  No arrhythmias (either fast or slow) noted.  Symptoms noted during sinus rhythm. Occasionally  was during dialysis. No arrhythmias noted. Completely normal study.  Myocardial Perfusion 09/13/2018:  The left ventricular ejection fraction is normal (55-65%).  Nuclear stress EF: 56%.  There was no ST segment deviation noted during stress.  No T wave inversion was noted during stress.  The study is normal.  This is a low risk study. Low risk stress nuclear study with normal perfusion and normal left ventricular regional and global systolic function.  )        Anesthesia Quick Evaluation

## 2023-11-06 NOTE — Progress Notes (Signed)
Anesthesia Chart Review:  47 yo female with pertinent hx including former smoker (quit 02/28/15), PONV, HTN, CAD (BMS PCI to prox LAD in setting of NSTEMI 03/31/16), murmur (trivial MR/TR 02/2021 echo), chronic diastolic heart failure, IDDM (with retinopathy, neuropathy, nephropathy), ESRD on HD, Bipolar disorder, Sickle cell trait, anemia, asthma.   Last cardiology office follow-up was on 06/14/23 with Melina Modena, NP for CAD follow-up and preoperative evaluation. GDMT limited by ESRD. Continue metoprolol. No new changes. Six month follow-up planned. Preoperative cardiology input most recently outlined by Edd Fabian, NP on 07/03/23: "Chart reviewed as part of pre-operative protocol coverage. Given past medical history and time since last visit, based on ACC/AHA guidelines, Jane [name] would be at acceptable risk for the planned procedure without further cardiovascular testing. She is able to exceed 4 METS of activity. Her RCRI is a Class IV risk with a 15.0%."   Echo 07/23/2023 showed EF 60 to 65%, normal RV systolic function, normal valves.  Zio monitor in September 2022 was normal. Low risk stress test 09/13/18.  Blood pressure noted to be elevated at preop testing, 200/100.  This is likely due to in part to fluid overload as she missed her HD session on 11/05/2023.  She normally dialyzes on Mondays Wednesdays and Fridays, but states she will be dialyzing Tuesday Friday Sunday due to the holidays.  She is instructed to monitor her blood pressure at home and to reach out to nephrologist who manages her hypertension.  Preop CBC reviewed, mild chronic anemia with hemoglobin 11.6, otherwise unremarkable.  BMP consistent with ESRD with creatinine 14.82, potassium elevated at 5.7 which is expected given her recently missed dialysis session.  EKG 09/13/2023: Sinus tachycardia.  Rate 112.  Probable LAE.  Borderline right axis deviation.  TTE 07/23/2023:  1. Left ventricular ejection fraction, by  estimation, is 60 to 65%. The  left ventricle has normal function. The left ventricle has no regional  wall motion abnormalities. There is mild left ventricular hypertrophy.  Left ventricular diastolic parameters  were normal.   2. Right ventricular systolic function is normal. The right ventricular  size is normal. There is normal pulmonary artery systolic pressure.   3. The mitral valve is normal in structure. No evidence of mitral valve  regurgitation. No evidence of mitral stenosis.   4. The aortic valve is tricuspid. Aortic valve regurgitation is not  visualized. No aortic stenosis is present.   5. The inferior vena cava is normal in size with greater than 50%  respiratory variability, suggesting right atrial pressure of 3 mmHg.   Long Term Zio Monitor 07/27/2021 - 07/30/2021: Zio patch worn for 3 days, 5 hours (September 13-16, 2022) Patient was in sinus rhythm during entire recording duration. Heart rate range 69 to 119 bpm with an average of 89 bpm. Rare PACs and PVCs with rare couplets above. No ventricular bigeminy or trigeminy noted. No arrhythmias (either fast or slow) noted. Symptoms noted during sinus rhythm. Occasionally was during dialysis. No arrhythmias noted. Completely normal study.   Myocardial Perfusion 09/13/2018: The left ventricular ejection fraction is normal (55-65%). Nuclear stress EF: 56%. There was no ST segment deviation noted during stress. No T wave inversion was noted during stress. The study is normal. This is a low risk study.  Low risk stress nuclear study with normal perfusion and normal left ventricular regional and global systolic function.    Zannie Cove [facility] Short Stay Center/Anesthesiology Phone [phone] 11/06/2023 1:19 PM

## 2023-11-09 ENCOUNTER — Encounter: Payer: Self-pay | Admitting: Physician Assistant

## 2023-11-09 ENCOUNTER — Telehealth: Payer: 59 | Admitting: Physician Assistant

## 2023-11-09 ENCOUNTER — Other Ambulatory Visit (HOSPITAL_COMMUNITY): Payer: Self-pay

## 2023-11-09 ENCOUNTER — Other Ambulatory Visit: Payer: Self-pay

## 2023-11-09 DIAGNOSIS — K047 Periapical abscess without sinus: Secondary | ICD-10-CM | POA: Diagnosis not present

## 2023-11-09 MED ORDER — CLINDAMYCIN HCL 300 MG PO CAPS
300.0000 mg | ORAL_CAPSULE | Freq: Two times a day (BID) | ORAL | 0 refills | Status: AC
  Filled 2023-11-09: qty 14, 7d supply, fill #0

## 2023-11-09 NOTE — Progress Notes (Signed)
Virtual Visit Consent   Jane [name], you are scheduled for a virtual visit with a [facility] provider today. Just as with appointments in the office, your consent must be obtained to participate. Your consent will be active for this visit and any virtual visit you may have with one of our providers in the next 365 days. If you have a MyChart account, a copy of this consent can be sent to you electronically.  As this is a virtual visit, video technology does not allow for your provider to perform a traditional examination. This may limit your provider's ability to fully assess your condition. If your provider identifies any concerns that need to be evaluated in person or the need to arrange testing (such as labs, EKG, etc.), we will make arrangements to do so. Although advances in technology are sophisticated, we cannot ensure that it will always work on either your end or our end. If the connection with a video visit is poor, the visit may have to be switched to a telephone visit. With either a video or telephone visit, we are not always able to ensure that we have a secure connection.  By engaging in this virtual visit, you consent to the provision of healthcare and authorize for your insurance to be billed (if applicable) for the services provided during this visit. Depending on your insurance coverage, you may receive a charge related to this service.  I need to obtain your verbal consent now. Are you willing to proceed with your visit today? Jane [name] has provided verbal consent on 11/09/2023 for a virtual visit (video or telephone). Viviano Simas, FNP  Date: 11/09/2023 5:15 PM  Virtual Visit via Video Note   I, Viviano Simas, connected with  Jane [name]  ([id number], 09/21/1976) on 11/09/23 at  5:15 PM EST by a video-enabled telemedicine application and verified that I am speaking with the correct person using two identifiers.  Location: Patient: Virtual Visit Location  Patient: Home Provider: Virtual Visit Location Provider: Home Office   I discussed the limitations of evaluation and management by telemedicine and the availability of in person appointments. The patient expressed understanding and agreed to proceed.    History of Present Illness: Jane [name] is a 47 y.o. who identifies as a female who was assigned female at birth, and is being seen today for a toothache she has had for the past 3 days.  Upper left back molar  She has attempted to call her dentist but they will be closed until next week   She feels the tooth may be infected   She is also scheduled to have a hysterectomy on Monday 11/13/23   Problems:  Patient Active Problem List   Diagnosis Date Noted   Encounter for perioperative consultation 06/26/2023   Abnormal hematology test result 04/12/2023   Hematuria 05/26/2022   Irritable bowel syndrome (IBS) 06/03/2021   Diabetic neuropathy (HCC) 01/29/2021   Hypertensive heart disease with chronic diastolic congestive heart failure (HCC) 01/06/2021   Osteoarthritis of left knee 02/11/2019   Nausea in adult 11/30/2018   Hidradenitis suppurativa 02/12/2018   Anemia in chronic kidney disease 02/12/2018   Secondary hyperparathyroidism of renal origin (HCC) 01/30/2018   Major depressive disorder 08/11/2017   Sinus tachycardia 02/23/2017   ESRD (end stage renal disease) on dialysis (HCC) 01/05/2017   CAD S/P BMS PCI to prox LAD 03/31/2016   Abnormal uterine bleeding (AUB) 03/17/2016   PAOD (peripheral arterial occlusive disease) (HCC) 01/08/2016  Charcot foot due to diabetes mellitus (HCC) 09/07/2015   Essential hypertension 08/16/2014   Seasonal allergic rhinitis 08/16/2014   Morbid obesity (HCC) 09/26/2012   Hyperlipidemia associated with type 2 diabetes mellitus (HCC) 02/26/2009   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, with long-term current use of insulin (HCC) 05/08/2007   Asthma  05/08/2007    Allergies:  Allergies  Allergen Reactions   Adhesive [Tape] Other (See Comments)    Irritation    Medications:  Current Outpatient Medications:    Accu-Chek Softclix Lancets lancets, Use to check blood sugar up to 3 (three) times daily., Disp: 300 each, Rfl: 3   acetaminophen (TYLENOL) 500 MG tablet, Take 2 tablets (1,000 mg total) by mouth every 8 (eight) hours as needed for moderate pain, mild pain, fever or headache., Disp: 30 tablet, Rfl: 0   albuterol (PROVENTIL HFA) 108 (90 Base) MCG/ACT inhaler, Inhale 2 puffs into the lungs every 4 (four) hours as needed for coughing, wheezing, or shortness of breath., Disp: 20.1 g, Rfl: 2   amLODipine-olmesartan (AZOR) 5-20 MG tablet, Take 1 tablet by mouth every night (Patient not taking: Reported on 09/06/2023), Disp: 90 tablet, Rfl: 3   apixaban (ELIQUIS) 2.5 MG TABS tablet, Take 1 tablet (2.5 mg total) by mouth 2 (two) times daily. (Patient not taking: Reported on 09/06/2023), Disp: 60 tablet, Rfl: 0   benzonatate (TESSALON) 100 MG capsule, Take 1 capsule (100 mg total) by mouth 3 (three) times daily as needed for cough., Disp: 21 capsule, Rfl: 0   blood glucose meter kit and supplies, Dispense based on patient and insurance preference. Use up to four times daily as directed. (FOR ICD-10 E10.9, E11.9). (Patient not taking: Reported on 09/06/2023), Disp: 1 each, Rfl: 0   blood glucose meter kit and supplies, Use in the morning, at noon, and at bedtime. (Patient not taking: Reported on 09/06/2023), Disp: 1 each, Rfl: 0   Blood Glucose Monitoring Suppl (ACCU-CHEK GUIDE) w/Device KIT, 1 each by Does not apply route 3 (three) times daily. (Patient not taking: Reported on 09/06/2023), Disp: 1 kit, Rfl: 0   cetirizine (ZYRTEC) 10 MG tablet, Take 10 mg by mouth daily., Disp: , Rfl:    Continuous Glucose Receiver (DEXCOM G7 RECEIVER) DEVI, Use  to continuously monitor your blood sugars., Disp: 1 each, Rfl: 0   Continuous Glucose Sensor (DEXCOM  G7 SENSOR) MISC, Use it for 10 days to continuously monitor your blood sugars., Disp: 9 each, Rfl: 3   dicyclomine (BENTYL) 10 MG capsule, Take 1 capsule (10 mg total) by mouth 3 (three) times daily before meals., Disp: 90 capsule, Rfl: 0   DULoxetine (CYMBALTA) 30 MG capsule, Take 1 capsule (30 mg total) by mouth daily., Disp: 30 capsule, Rfl: 1   estradiol (ESTRACE VAGINAL) 0.1 MG/GM vaginal cream, Place 1 Applicatorful vaginally nightly for 2 weeks then 2 nights a week afterwards. (Patient not taking: Reported on 09/06/2023), Disp: 42.5 g, Rfl: 4   gabapentin (NEURONTIN) 100 MG capsule, Take 1 capsule (100 mg total) by mouth at bedtime., Disp: 90 capsule, Rfl: 1   glucose blood (ACCU-CHEK GUIDE) test strip, Use to check blood sugar up to 3 (three) times daily. (Patient not taking: Reported on 09/06/2023), Disp: 300 each, Rfl: 3   Glucose Blood (BLOOD GLUCOSE TEST STRIPS) STRP, Check blood sugars fasting and then with each meal up to 4 times a day. (Patient not taking: Reported on 09/06/2023), Disp: 100 strip, Rfl: 0   icosapent Ethyl (VASCEPA) 1  g capsule, Take 2 capsules (2 g total) by mouth 2 (two) times daily., Disp: 180 capsule, Rfl: 1   injection device for insulin (INPEN 100-PINK-LILLY-HUMALOG) DEVI, Use to inject Humalog insulin for meals and correction insulin, Disp: 1 each, Rfl: 1   insulin degludec (TRESIBA) 100 UNIT/ML FlexTouch Pen, Inject 24 Units into the skin at bedtime., Disp: 15 mL, Rfl: 1   insulin lispro (HUMALOG KWIKPEN) 100 UNIT/ML KwikPen, Use 6-20 units as directed before meals three times a day, Disp: 15 mL, Rfl: 3   Insulin Pen Needle 32G X 4 MM MISC, Use to inject insulin 4 (four) times daily., Disp: 400 each, Rfl: 3   Insulin Syringe-Needle U-100 31G X 15/64" 0.3 ML MISC, Use to inject insulin daily, Disp: 100 each, Rfl: 3   Lancet Device MISC, Use 1 each in the morning, at noon, and at bedtime. (Patient not taking: Reported on 09/06/2023), Disp: 1 each, Rfl: 0   Lancets  (ONETOUCH DELICA PLUS LANCET33G) MISC, 1 each in the morning, at noon, and at bedtime. (Patient not taking: Reported on 09/06/2023), Disp: 100 each, Rfl: 0   lanthanum (FOSRENOL) 1000 MG chewable tablet, Chew 1 tablet (1,000 mg total) by mouth 3 (three) times daily with meals., Disp: 270 tablet, Rfl: 4   leflunomide (ARAVA) 10 MG tablet, Take 1 tablet (10 mg total) by mouth daily., Disp: 30 tablet, Rfl: 2   leflunomide (ARAVA) 10 MG tablet, Take 1 tablet (10 mg total) by mouth daily. (Patient not taking: Reported on 10/31/2023), Disp: 30 tablet, Rfl: 2   megestrol (MEGACE) 40 MG tablet, Take 1 tablet (40 mg total) by mouth 2 (two) times daily. Can increase to two tablets twice a day in the event of heavy bleeding, Disp: 60 tablet, Rfl: 5   methocarbamol (ROBAXIN) 500 MG tablet, Take 1 tablet (500 mg total) by mouth every 6 (six) hours as needed for muscle spasms., Disp: 20 tablet, Rfl: 0   metoCLOPramide (REGLAN) 10 MG tablet, Take 1 tablet by mouth once a day as needed, Disp: 30 tablet, Rfl: 0   metoprolol tartrate (LOPRESSOR) 25 MG tablet, Take 1 tablet (25 mg total) by mouth 2 (two) times daily. Do not take the night before dialysis, and take 2 hours after dialysis. (Patient not taking: Reported on 09/06/2023), Disp: 180 tablet, Rfl: 0   Multiple Vitamins-Minerals (HAIR SKIN & NAILS) TABS, Take 1 tablet by mouth daily., Disp: , Rfl:    multivitamin (RENA-VIT) TABS tablet, Take 1 tablet by mouth daily., Disp: 90 tablet, Rfl: 3   oxyCODONE-acetaminophen (PERCOCET) 10-325 MG tablet, Take 1 tablet by mouth every 4 (four) hours as needed for pain. (Patient not taking: Reported on 10/31/2023), Disp: 42 tablet, Rfl: 0   oxyCODONE-acetaminophen (PERCOCET) 10-325 MG tablet, Take 1 tablet by mouth 5 (five) times daily as needed  08/02/23 (Patient not taking: Reported on 09/06/2023), Disp: 150 tablet, Rfl: 0   oxyCODONE-acetaminophen (PERCOCET) 10-325 MG tablet, Take 1 tablet by mouth 5 (five) times daily as  needed. (Patient not taking: Reported on 09/06/2023), Disp: 150 tablet, Rfl: 0   oxyCODONE-acetaminophen (PERCOCET) 10-325 MG tablet, Take 1 tablet by mouth 5 (five) times daily as needed. (Patient not taking: Reported on 10/31/2023), Disp: 150 tablet, Rfl: 0   oxyCODONE-acetaminophen (PERCOCET) 10-325 MG tablet, Take 1 tablet by mouth 5 (five) times daily as needed., Disp: 150 tablet, Rfl: 0   polyethylene glycol (MIRALAX / GLYCOLAX) 17 g packet, Take 17 g by mouth daily as needed for moderate constipation.,  Disp: , Rfl:    promethazine (PHENERGAN) 12.5 MG tablet, Take 1 tablet (12.5 mg total) by mouth every 6 (six) hours as needed for nausea or vomiting., Disp: 20 tablet, Rfl: 0   rosuvastatin (CRESTOR) 40 MG tablet, Take 1 tablet (40 mg total) by mouth daily., Disp: 90 tablet, Rfl: 1   sevelamer carbonate (RENVELA) 800 MG tablet, Take 1,600-3,200 mg by mouth See admin instructions. Take 3200 mg with each meal and 1600 mg with each snack, Disp: , Rfl:   Current Facility-Administered Medications:    polyethylene glycol powder (GLYCOLAX/MIRALAX) container 255 g, 1 Container, Oral, Once, Crissie Sickles, MD  Observations/Objective: Patient is well-developed, well-nourished in no acute distress.  Resting comfortably  at home.  Head is normocephalic, atraumatic.  No labored breathing.  Speech is clear and coherent with logical content.  Patient is alert and oriented at baseline.    Assessment and Plan:   1. Dental abscess (Primary) Stop day of surgery  Take with food   - clindamycin (CLEOCIN) 300 MG capsule; Take 1 capsule (300 mg total) by mouth in the morning and at bedtime for 7 days.  Dispense: 14 capsule; Refill: 0     Follow Up Instructions: I discussed the assessment and treatment plan with the patient. The patient was provided an opportunity to ask questions and all were answered. The patient agreed with the plan and demonstrated an understanding of the instructions.  A copy of  instructions were sent to the patient via MyChart unless otherwise noted below.    The patient was advised to call back or seek an in-person evaluation if the symptoms worsen or if the condition fails to improve as anticipated.    Viviano Simas, FNP

## 2023-11-10 ENCOUNTER — Other Ambulatory Visit: Payer: Self-pay

## 2023-11-10 ENCOUNTER — Encounter: Payer: Self-pay | Admitting: Obstetrics and Gynecology

## 2023-11-10 ENCOUNTER — Other Ambulatory Visit (HOSPITAL_COMMUNITY): Payer: Self-pay

## 2023-11-10 DIAGNOSIS — Z794 Long term (current) use of insulin: Secondary | ICD-10-CM

## 2023-11-13 ENCOUNTER — Observation Stay (HOSPITAL_COMMUNITY): Payer: 59 | Admitting: Anesthesiology

## 2023-11-13 ENCOUNTER — Other Ambulatory Visit (HOSPITAL_COMMUNITY): Payer: Self-pay

## 2023-11-13 ENCOUNTER — Inpatient Hospital Stay (HOSPITAL_COMMUNITY)
Admission: AD | Admit: 2023-11-13 | Discharge: 2023-11-16 | DRG: 742 | Disposition: A | Discharge status: Home / Self Care | Payer: 59 | Attending: Obstetrics and Gynecology | Admitting: Obstetrics and Gynecology

## 2023-11-13 ENCOUNTER — Other Ambulatory Visit: Payer: Self-pay

## 2023-11-13 ENCOUNTER — Observation Stay (HOSPITAL_COMMUNITY): Payer: 59 | Admitting: Physician Assistant

## 2023-11-13 ENCOUNTER — Encounter (HOSPITAL_COMMUNITY)
Admission: AD | Disposition: A | Discharge status: Home / Self Care | Payer: Self-pay | Source: Home / Self Care | Attending: Obstetrics and Gynecology

## 2023-11-13 DIAGNOSIS — Z794 Long term (current) use of insulin: Secondary | ICD-10-CM

## 2023-11-13 DIAGNOSIS — N2581 Secondary hyperparathyroidism of renal origin: Secondary | ICD-10-CM | POA: Diagnosis present

## 2023-11-13 DIAGNOSIS — Z96652 Presence of left artificial knee joint: Secondary | ICD-10-CM | POA: Diagnosis present

## 2023-11-13 DIAGNOSIS — Z833 Family history of diabetes mellitus: Secondary | ICD-10-CM

## 2023-11-13 DIAGNOSIS — K219 Gastro-esophageal reflux disease without esophagitis: Secondary | ICD-10-CM | POA: Diagnosis present

## 2023-11-13 DIAGNOSIS — Z8614 Personal history of Methicillin resistant Staphylococcus aureus infection: Secondary | ICD-10-CM

## 2023-11-13 DIAGNOSIS — I132 Hypertensive heart and chronic kidney disease with heart failure and with stage 5 chronic kidney disease, or end stage renal disease: Secondary | ICD-10-CM | POA: Diagnosis present

## 2023-11-13 DIAGNOSIS — Z5941 Food insecurity: Secondary | ICD-10-CM

## 2023-11-13 DIAGNOSIS — E119 Type 2 diabetes mellitus without complications: Secondary | ICD-10-CM

## 2023-11-13 DIAGNOSIS — Z8249 Family history of ischemic heart disease and other diseases of the circulatory system: Secondary | ICD-10-CM

## 2023-11-13 DIAGNOSIS — Z992 Dependence on renal dialysis: Secondary | ICD-10-CM

## 2023-11-13 DIAGNOSIS — R9389 Abnormal findings on diagnostic imaging of other specified body structures: Secondary | ICD-10-CM

## 2023-11-13 DIAGNOSIS — Z5986 Financial insecurity: Secondary | ICD-10-CM

## 2023-11-13 DIAGNOSIS — F319 Bipolar disorder, unspecified: Secondary | ICD-10-CM | POA: Diagnosis present

## 2023-11-13 DIAGNOSIS — Z955 Presence of coronary angioplasty implant and graft: Secondary | ICD-10-CM

## 2023-11-13 DIAGNOSIS — Z79899 Other long term (current) drug therapy: Secondary | ICD-10-CM

## 2023-11-13 DIAGNOSIS — Z5982 Transportation insecurity: Secondary | ICD-10-CM

## 2023-11-13 DIAGNOSIS — K660 Peritoneal adhesions (postprocedural) (postinfection): Secondary | ICD-10-CM | POA: Diagnosis present

## 2023-11-13 DIAGNOSIS — I252 Old myocardial infarction: Secondary | ICD-10-CM

## 2023-11-13 DIAGNOSIS — N939 Abnormal uterine and vaginal bleeding, unspecified: Principal | ICD-10-CM | POA: Diagnosis present

## 2023-11-13 DIAGNOSIS — R066 Hiccough: Secondary | ICD-10-CM | POA: Diagnosis not present

## 2023-11-13 DIAGNOSIS — R112 Nausea with vomiting, unspecified: Secondary | ICD-10-CM | POA: Diagnosis present

## 2023-11-13 DIAGNOSIS — E1140 Type 2 diabetes mellitus with diabetic neuropathy, unspecified: Secondary | ICD-10-CM | POA: Diagnosis present

## 2023-11-13 DIAGNOSIS — E1122 Type 2 diabetes mellitus with diabetic chronic kidney disease: Secondary | ICD-10-CM | POA: Diagnosis present

## 2023-11-13 DIAGNOSIS — I5032 Chronic diastolic (congestive) heart failure: Secondary | ICD-10-CM | POA: Diagnosis present

## 2023-11-13 DIAGNOSIS — D631 Anemia in chronic kidney disease: Secondary | ICD-10-CM | POA: Diagnosis present

## 2023-11-13 DIAGNOSIS — Z01818 Encounter for other preprocedural examination: Secondary | ICD-10-CM

## 2023-11-13 DIAGNOSIS — E113293 Type 2 diabetes mellitus with mild nonproliferative diabetic retinopathy without macular edema, bilateral: Secondary | ICD-10-CM | POA: Diagnosis present

## 2023-11-13 DIAGNOSIS — I2510 Atherosclerotic heart disease of native coronary artery without angina pectoris: Secondary | ICD-10-CM | POA: Diagnosis present

## 2023-11-13 DIAGNOSIS — D573 Sickle-cell trait: Secondary | ICD-10-CM | POA: Diagnosis present

## 2023-11-13 DIAGNOSIS — D251 Intramural leiomyoma of uterus: Secondary | ICD-10-CM | POA: Diagnosis not present

## 2023-11-13 DIAGNOSIS — Z87891 Personal history of nicotine dependence: Secondary | ICD-10-CM

## 2023-11-13 DIAGNOSIS — E785 Hyperlipidemia, unspecified: Secondary | ICD-10-CM | POA: Diagnosis present

## 2023-11-13 DIAGNOSIS — Z823 Family history of stroke: Secondary | ICD-10-CM

## 2023-11-13 DIAGNOSIS — E1151 Type 2 diabetes mellitus with diabetic peripheral angiopathy without gangrene: Secondary | ICD-10-CM | POA: Diagnosis present

## 2023-11-13 DIAGNOSIS — N186 End stage renal disease: Secondary | ICD-10-CM | POA: Diagnosis present

## 2023-11-13 HISTORY — PX: CYSTOSCOPY: SHX5120

## 2023-11-13 HISTORY — PX: TOTAL LAPAROSCOPIC HYSTERECTOMY WITH SALPINGECTOMY: SHX6742

## 2023-11-13 LAB — POCT I-STAT, CHEM 8
BUN: 40 mg/dL — ABNORMAL HIGH (ref 6–20)
Calcium, Ion: 1.2 mmol/L (ref 1.15–1.40)
Chloride: 96 mmol/L — ABNORMAL LOW (ref 98–111)
Creatinine, Ser: 10 mg/dL — ABNORMAL HIGH (ref 0.44–1.00)
Glucose, Bld: 176 mg/dL — ABNORMAL HIGH (ref 70–99)
HCT: 42 % (ref 36.0–46.0)
Hemoglobin: 14.3 g/dL (ref 12.0–15.0)
Potassium: 4.4 mmol/L (ref 3.5–5.1)
Sodium: 133 mmol/L — ABNORMAL LOW (ref 135–145)
TCO2: 28 mmol/L (ref 22–32)

## 2023-11-13 LAB — HCG, SERUM, QUALITATIVE: Preg, Serum: NEGATIVE

## 2023-11-13 LAB — GLUCOSE, CAPILLARY
Glucose-Capillary: 173 mg/dL — ABNORMAL HIGH (ref 70–99)
Glucose-Capillary: 255 mg/dL — ABNORMAL HIGH (ref 70–99)
Glucose-Capillary: 272 mg/dL — ABNORMAL HIGH (ref 70–99)
Glucose-Capillary: 267 mg/dL — ABNORMAL HIGH (ref 70–99)
Glucose-Capillary: 150 mg/dL — ABNORMAL HIGH (ref 70–99)

## 2023-11-13 LAB — HEPATITIS B SURFACE ANTIGEN: Hepatitis B Surface Ag: NONREACTIVE

## 2023-11-13 SURGERY — HYSTERECTOMY, TOTAL, LAPAROSCOPIC, WITH SALPINGECTOMY
Anesthesia: General | Site: Bladder

## 2023-11-13 MED ORDER — INSULIN ASPART 100 UNIT/ML IJ SOLN
3.0000 [IU] | Freq: Once | INTRAMUSCULAR | Status: AC
  Administered 2023-11-13: 3 [IU] via SUBCUTANEOUS

## 2023-11-13 MED ORDER — AMLODIPINE BESYLATE 5 MG PO TABS
5.0000 mg | ORAL_TABLET | Freq: Every day | ORAL | Status: DC
  Administered 2023-11-13 – 2023-11-16 (×4): 5 mg via ORAL
  Filled 2023-11-13 (×4): qty 1

## 2023-11-13 MED ORDER — HYDRALAZINE HCL 20 MG/ML IJ SOLN
INTRAMUSCULAR | Status: AC
  Filled 2023-11-13: qty 1

## 2023-11-13 MED ORDER — METHOCARBAMOL 500 MG PO TABS
500.0000 mg | ORAL_TABLET | Freq: Four times a day (QID) | ORAL | Status: DC | PRN
  Administered 2023-11-13: 500 mg via ORAL
  Filled 2023-11-13: qty 1

## 2023-11-13 MED ORDER — ACETAMINOPHEN 500 MG PO TABS
1000.0000 mg | ORAL_TABLET | Freq: Once | ORAL | Status: AC
  Administered 2023-11-13: 1000 mg via ORAL

## 2023-11-13 MED ORDER — POLYETHYLENE GLYCOL 3350 17 G PO PACK: 17.0000 g | PACK | Freq: Every day | ORAL | Status: DC | PRN

## 2023-11-13 MED ORDER — OXYCODONE-ACETAMINOPHEN 10-325 MG PO TABS: 1.0000 | ORAL_TABLET | ORAL | Status: DC | PRN

## 2023-11-13 MED ORDER — SUGAMMADEX SODIUM 200 MG/2ML IV SOLN
INTRAVENOUS | Status: DC | PRN
  Administered 2023-11-13: 200 mg via INTRAVENOUS

## 2023-11-13 MED ORDER — ACETAMINOPHEN 10 MG/ML IV SOLN
INTRAVENOUS | Status: AC
  Filled 2023-11-13: qty 100

## 2023-11-13 MED ORDER — ACETAMINOPHEN 10 MG/ML IV SOLN: 1000.0000 mg | Freq: Once | INTRAVENOUS | Status: DC | PRN

## 2023-11-13 MED ORDER — SODIUM CHLORIDE 0.9 % IR SOLN
Status: DC | PRN
  Administered 2023-11-13: 1000 mL

## 2023-11-13 MED ORDER — DICYCLOMINE HCL 10 MG PO CAPS
10.0000 mg | ORAL_CAPSULE | Freq: Three times a day (TID) | ORAL | Status: DC
  Administered 2023-11-13 – 2023-11-16 (×4): 10 mg via ORAL
  Filled 2023-11-13 (×5): qty 1

## 2023-11-13 MED ORDER — INSULIN DEGLUDEC 100 UNIT/ML ~~LOC~~ SOPN: 24.0000 [IU] | PEN_INJECTOR | Freq: Every day | SUBCUTANEOUS | Status: DC

## 2023-11-13 MED ORDER — LABETALOL HCL 5 MG/ML IV SOLN
INTRAVENOUS | Status: AC
  Filled 2023-11-13: qty 4

## 2023-11-13 MED ORDER — ONDANSETRON HCL 4 MG/2ML IJ SOLN
INTRAMUSCULAR | Status: DC | PRN
  Administered 2023-11-13: 4 mg via INTRAVENOUS

## 2023-11-13 MED ORDER — FLUORESCEIN SODIUM 10 % IV SOLN: INTRAVENOUS | Status: DC | PRN

## 2023-11-13 MED ORDER — FENTANYL CITRATE (PF) 250 MCG/5ML IJ SOLN
INTRAMUSCULAR | Status: DC | PRN
  Administered 2023-11-13: 100 ug via INTRAVENOUS

## 2023-11-13 MED ORDER — LABETALOL HCL 5 MG/ML IV SOLN
5.0000 mg | Freq: Once | INTRAVENOUS | Status: AC
  Administered 2023-11-13: 5 mg via INTRAVENOUS
  Filled 2023-11-13: qty 4

## 2023-11-13 MED ORDER — SODIUM CHLORIDE 0.9 % IV SOLN
12.5000 mg | Freq: Four times a day (QID) | INTRAVENOUS | Status: DC | PRN
  Administered 2023-11-13 – 2023-11-15 (×4): 12.5 mg via INTRAVENOUS
  Filled 2023-11-13 (×2): qty 0.5
  Filled 2023-11-13 (×3): qty 12.5

## 2023-11-13 MED ORDER — HYDRALAZINE HCL 20 MG/ML IJ SOLN
10.0000 mg | Freq: Once | INTRAMUSCULAR | Status: AC
  Administered 2023-11-13: 10 mg via INTRAVENOUS
  Filled 2023-11-13: qty 1

## 2023-11-13 MED ORDER — LIDOCAINE 2% (20 MG/ML) 5 ML SYRINGE
INTRAMUSCULAR | Status: DC | PRN
  Administered 2023-11-13: 50 mg via INTRAVENOUS

## 2023-11-13 MED ORDER — INSULIN GLARGINE-YFGN 100 UNIT/ML ~~LOC~~ SOLN
24.0000 [IU] | Freq: Every day | SUBCUTANEOUS | Status: DC
  Administered 2023-11-13 – 2023-11-15 (×3): 24 [IU] via SUBCUTANEOUS
  Filled 2023-11-13 (×6): qty 0.24

## 2023-11-13 MED ORDER — ACETAMINOPHEN 325 MG PO TABS
325.0000 mg | ORAL_TABLET | ORAL | Status: DC | PRN
Start: 2023-11-13 — End: 2023-11-13

## 2023-11-13 MED ORDER — DROPERIDOL 2.5 MG/ML IJ SOLN
0.6250 mg | Freq: Once | INTRAMUSCULAR | Status: AC | PRN
  Administered 2023-11-13: 0.625 mg via INTRAVENOUS

## 2023-11-13 MED ORDER — OXYCODONE HCL 5 MG PO TABS: 10.0000 mg | ORAL_TABLET | ORAL | Status: DC | PRN

## 2023-11-13 MED ORDER — PROPOFOL 10 MG/ML IV BOLUS
INTRAVENOUS | Status: AC
  Filled 2023-11-13: qty 20

## 2023-11-13 MED ORDER — ONDANSETRON HCL 4 MG/2ML IJ SOLN
4.0000 mg | Freq: Once | INTRAMUSCULAR | Status: AC
  Administered 2023-11-13: 4 mg via INTRAVENOUS
  Filled 2023-11-13: qty 2

## 2023-11-13 MED ORDER — LACTATED RINGERS IV SOLN: INTRAVENOUS | Status: AC

## 2023-11-13 MED ORDER — HYDRALAZINE HCL 20 MG/ML IJ SOLN
5.0000 mg | Freq: Once | INTRAMUSCULAR | Status: AC
  Administered 2023-11-13: 5 mg via INTRAVENOUS

## 2023-11-13 MED ORDER — FENTANYL CITRATE (PF) 100 MCG/2ML IJ SOLN
25.0000 ug | INTRAMUSCULAR | Status: DC | PRN
  Administered 2023-11-13: 50 ug via INTRAVENOUS

## 2023-11-13 MED ORDER — FENTANYL CITRATE (PF) 250 MCG/5ML IJ SOLN
INTRAMUSCULAR | Status: AC
  Filled 2023-11-13: qty 5

## 2023-11-13 MED ORDER — SODIUM CHLORIDE 0.9 % IV SOLN: INTRAVENOUS | Status: DC | PRN

## 2023-11-13 MED ORDER — INSULIN ASPART 100 UNIT/ML IJ SOLN
0.0000 [IU] | Freq: Three times a day (TID) | INTRAMUSCULAR | Status: DC
  Administered 2023-11-13 (×2): 3 [IU] via SUBCUTANEOUS

## 2023-11-13 MED ORDER — GABAPENTIN 100 MG PO CAPS
100.0000 mg | ORAL_CAPSULE | Freq: Every day | ORAL | Status: DC
  Administered 2023-11-13 – 2023-11-15 (×3): 100 mg via ORAL
  Filled 2023-11-13 (×3): qty 1

## 2023-11-13 MED ORDER — ACETAMINOPHEN 325 MG PO TABS: 650.0000 mg | ORAL_TABLET | ORAL | Status: DC | PRN

## 2023-11-13 MED ORDER — CHLORHEXIDINE GLUCONATE CLOTH 2 % EX PADS
6.0000 | MEDICATED_PAD | Freq: Every day | CUTANEOUS | Status: DC
  Administered 2023-11-14 – 2023-11-16 (×3): 6 via TOPICAL

## 2023-11-13 MED ORDER — LABETALOL HCL 5 MG/ML IV SOLN
5.0000 mg | Freq: Once | INTRAVENOUS | Status: AC
  Administered 2023-11-13: 5 mg via INTRAVENOUS

## 2023-11-13 MED ORDER — SCOPOLAMINE 1 MG/3DAYS TD PT72
1.0000 | MEDICATED_PATCH | TRANSDERMAL | Status: DC
  Administered 2023-11-13: 1.5 mg via TRANSDERMAL
  Filled 2023-11-13: qty 1

## 2023-11-13 MED ORDER — DROPERIDOL 2.5 MG/ML IJ SOLN
INTRAMUSCULAR | Status: AC
  Filled 2023-11-13: qty 2

## 2023-11-13 MED ORDER — PROPOFOL 10 MG/ML IV BOLUS
INTRAVENOUS | Status: DC | PRN
  Administered 2023-11-13: 140 mg via INTRAVENOUS

## 2023-11-13 MED ORDER — HUMALOG 100 UNIT/ML ~~LOC~~ SOCT
6.0000 [IU] | Freq: Three times a day (TID) | SUBCUTANEOUS | 3 refills | Status: DC
  Filled 2023-11-13: qty 15, 25d supply, fill #0

## 2023-11-13 MED ORDER — CHLORHEXIDINE GLUCONATE 0.12 % MT SOLN
15.0000 mL | Freq: Once | OROMUCOSAL | Status: AC
  Administered 2023-11-13: 15 mL via OROMUCOSAL
  Filled 2023-11-13: qty 15

## 2023-11-13 MED ORDER — PROCHLORPERAZINE EDISYLATE 10 MG/2ML IJ SOLN
10.0000 mg | Freq: Four times a day (QID) | INTRAMUSCULAR | Status: DC | PRN
  Administered 2023-11-14 – 2023-11-15 (×4): 10 mg via INTRAVENOUS
  Filled 2023-11-13 (×4): qty 2

## 2023-11-13 MED ORDER — PROMETHAZINE HCL 12.5 MG PO TABS
12.5000 mg | ORAL_TABLET | Freq: Four times a day (QID) | ORAL | 0 refills | Status: AC | PRN
  Filled 2023-11-13: qty 30, 8d supply, fill #0

## 2023-11-13 MED ORDER — POVIDONE-IODINE 10 % EX SWAB
2.0000 | Freq: Once | CUTANEOUS | Status: AC
Start: 2023-11-13 — End: 2023-11-13
  Administered 2023-11-13: 2 via TOPICAL

## 2023-11-13 MED ORDER — MIDAZOLAM HCL 2 MG/2ML IJ SOLN
INTRAMUSCULAR | Status: DC | PRN
  Administered 2023-11-13: 2 mg via INTRAVENOUS

## 2023-11-13 MED ORDER — LACTATED RINGERS IV SOLN: INTRAVENOUS | Status: DC

## 2023-11-13 MED ORDER — MIDAZOLAM HCL 2 MG/2ML IJ SOLN
INTRAMUSCULAR | Status: AC
  Filled 2023-11-13: qty 2

## 2023-11-13 MED ORDER — ACETAMINOPHEN 160 MG/5ML PO SOLN: 325.0000 mg | ORAL | Status: DC | PRN

## 2023-11-13 MED ORDER — DEXAMETHASONE SODIUM PHOSPHATE 10 MG/ML IJ SOLN
INTRAMUSCULAR | Status: DC | PRN
  Administered 2023-11-13: 4 mg via INTRAVENOUS

## 2023-11-13 MED ORDER — BUPIVACAINE HCL (PF) 0.5 % IJ SOLN
INTRAMUSCULAR | Status: DC | PRN
  Administered 2023-11-13: 10 mL

## 2023-11-13 MED ORDER — ACETAMINOPHEN 500 MG PO TABS
500.0000 mg | ORAL_TABLET | ORAL | Status: AC
  Filled 2023-11-13: qty 1

## 2023-11-13 MED ORDER — DULOXETINE HCL 30 MG PO CPEP
30.0000 mg | ORAL_CAPSULE | Freq: Every day | ORAL | Status: DC
  Administered 2023-11-13 – 2023-11-16 (×4): 30 mg via ORAL
  Filled 2023-11-13 (×4): qty 1

## 2023-11-13 MED ORDER — ROCURONIUM BROMIDE 10 MG/ML (PF) SYRINGE
PREFILLED_SYRINGE | INTRAVENOUS | Status: DC | PRN
  Administered 2023-11-13: 40 mg via INTRAVENOUS

## 2023-11-13 MED ORDER — 0.9 % SODIUM CHLORIDE (POUR BTL) OPTIME
TOPICAL | Status: DC | PRN
  Administered 2023-11-13: 1000 mL

## 2023-11-13 MED ORDER — ORAL CARE MOUTH RINSE: 15.0000 mL | Freq: Once | OROMUCOSAL | Status: AC

## 2023-11-13 MED ORDER — INSULIN ASPART 100 UNIT/ML IJ SOLN
2.0000 [IU] | Freq: Three times a day (TID) | INTRAMUSCULAR | Status: DC
  Administered 2023-11-13 – 2023-11-16 (×3): 2 [IU] via SUBCUTANEOUS

## 2023-11-13 MED ORDER — ALBUTEROL SULFATE (2.5 MG/3ML) 0.083% IN NEBU: 3.0000 mL | INHALATION_SOLUTION | RESPIRATORY_TRACT | Status: DC | PRN

## 2023-11-13 MED ORDER — BUPIVACAINE HCL 0.5 % IJ SOLN
INTRAMUSCULAR | Status: AC
  Filled 2023-11-13: qty 1

## 2023-11-13 MED ORDER — HYDROMORPHONE HCL 1 MG/ML IJ SOLN: 0.2000 mg | INTRAMUSCULAR | Status: DC | PRN

## 2023-11-13 MED ORDER — OXYCODONE HCL 5 MG PO TABS: 5.0000 mg | ORAL_TABLET | Freq: Once | ORAL | Status: DC | PRN

## 2023-11-13 MED ORDER — IRBESARTAN 150 MG PO TABS
150.0000 mg | ORAL_TABLET | Freq: Every day | ORAL | Status: DC
  Administered 2023-11-13 – 2023-11-16 (×4): 150 mg via ORAL
  Filled 2023-11-13 (×4): qty 1

## 2023-11-13 MED ORDER — FENTANYL CITRATE (PF) 100 MCG/2ML IJ SOLN
INTRAMUSCULAR | Status: AC
  Filled 2023-11-13: qty 2

## 2023-11-13 MED ORDER — CEFAZOLIN SODIUM-DEXTROSE 2-4 GM/100ML-% IV SOLN
2.0000 g | INTRAVENOUS | Status: AC
  Administered 2023-11-13: 2 g via INTRAVENOUS
  Filled 2023-11-13: qty 100

## 2023-11-13 MED ORDER — POLYETHYLENE GLYCOL 3350 17 G PO PACK
17.0000 g | PACK | Freq: Every day | ORAL | Status: DC | PRN
Start: 2023-11-13 — End: 2023-11-13

## 2023-11-13 MED ORDER — OXYCODONE HCL 5 MG/5ML PO SOLN: 5.0000 mg | Freq: Once | ORAL | Status: DC | PRN

## 2023-11-13 SURGICAL SUPPLY — 54 items
APPLICATOR ARISTA FLEXITIP XL (MISCELLANEOUS) IMPLANT
COVER MAYO STAND STRL (DRAPES) ×2 IMPLANT
DERMABOND ADVANCED .7 DNX12 (GAUZE/BANDAGES/DRESSINGS) ×2 IMPLANT
DURAPREP 26ML APPLICATOR (WOUND CARE) ×2 IMPLANT
GLOVE BIO SURGEON STRL SZ7 (GLOVE) ×4 IMPLANT
GLOVE SURG UNDER POLY LF SZ7 (GLOVE) ×8 IMPLANT
GOWN STRL REUS W/ TWL XL LVL3 (GOWN DISPOSABLE) ×6 IMPLANT
HARMONIC RUM II 2.5CM SILVER (DISPOSABLE)
HARMONIC RUM II 3.0CM SILVER (DISPOSABLE)
HARMONIC RUM II 3.5CM SILVER (DISPOSABLE)
HARMONIC RUM II 4.0CM SILVER (DISPOSABLE)
HEMOSTAT ARISTA ABSORB 3G PWDR (HEMOSTASIS) IMPLANT
HIBICLENS CHG 4% 4OZ BTL (MISCELLANEOUS) ×4 IMPLANT
IRRIG SUCT STRYKERFLOW 2 WTIP (MISCELLANEOUS) ×2
IRRIGATION SUCT STRKRFLW 2 WTP (MISCELLANEOUS) ×2 IMPLANT
KIT PINK PAD W/HEAD ARE REST (MISCELLANEOUS) ×2
KIT PINK PAD W/HEAD ARM REST (MISCELLANEOUS) ×2 IMPLANT
KIT TURNOVER KIT B (KITS) ×2 IMPLANT
LIGASURE VESSEL 5MM BLUNT TIP (ELECTROSURGICAL) ×2 IMPLANT
NDL INSUFFLATION 14GA 120MM (NEEDLE) ×2 IMPLANT
NDL SPNL 22GX3.5 QUINCKE BK (NEEDLE) ×2 IMPLANT
NEEDLE INSUFFLATION 14GA 120MM (NEEDLE) ×2 IMPLANT
NEEDLE SPNL 22GX3.5 QUINCKE BK (NEEDLE) ×2 IMPLANT
NS IRRIG 1000ML POUR BTL (IV SOLUTION) ×2 IMPLANT
OCCLUDER COLPOPNEUMO (BALLOONS) IMPLANT
PACK LAPAROSCOPY BASIN (CUSTOM PROCEDURE TRAY) ×2 IMPLANT
POUCH LAPAROSCOPIC INSTRUMENT (MISCELLANEOUS) ×2 IMPLANT
SCALPEL HRMNC RUM II 2.5 SILVR (DISPOSABLE) IMPLANT
SCALPEL HRMNC RUM II 3.0 SILVR (DISPOSABLE) IMPLANT
SCALPEL HRMNC RUM II 3.5 SILVR (DISPOSABLE) IMPLANT
SCALPEL HRMNC RUM II 4.0 SILVR (DISPOSABLE) IMPLANT
SCISSORS LAP 5X35 DISP (ENDOMECHANICALS) IMPLANT
SET CYSTO W/LG BORE CLAMP LF (SET/KITS/TRAYS/PACK) ×2 IMPLANT
SET TRI-LUMEN FLTR TB AIRSEAL (TUBING) ×2 IMPLANT
SHEARS HARMONIC 36 ACE (MISCELLANEOUS) IMPLANT
SLEEVE ADV FIXATION 5X100MM (TROCAR) ×2 IMPLANT
SUT VIC AB 0 CT1 27XBRD ANBCTR (SUTURE) IMPLANT
SUT VIC AB 4-0 PS2 18 (SUTURE) ×4 IMPLANT
SUT VLOC 180 0 9IN GS21 (SUTURE) ×2 IMPLANT
SYR 10ML LL (SYRINGE) ×2 IMPLANT
SYR 50ML LL SCALE MARK (SYRINGE) ×2 IMPLANT
SYR CONTROL 10ML LL (SYRINGE) ×2 IMPLANT
TIP UTERINE 5.1X6CM LAV DISP (MISCELLANEOUS) IMPLANT
TIP UTERINE 6.7X10CM GRN DISP (MISCELLANEOUS) IMPLANT
TIP UTERINE 6.7X6CM WHT DISP (MISCELLANEOUS) IMPLANT
TIP UTERINE 6.7X8CM BLUE DISP (MISCELLANEOUS) IMPLANT
TOWEL GREEN STERILE FF (TOWEL DISPOSABLE) ×4 IMPLANT
TRAY FOLEY W/BAG SLVR 14FR (SET/KITS/TRAYS/PACK) ×2 IMPLANT
TROCAR ADV FIXATION 5X100MM (TROCAR) ×2 IMPLANT
TROCAR PORT AIRSEAL 8X120 (TROCAR) ×2 IMPLANT
TROCAR XCEL NON BLADE 8MM B8LT (ENDOMECHANICALS) ×2 IMPLANT
TROCAR XCEL NON-BLD 5MMX100MML (ENDOMECHANICALS) ×2 IMPLANT
UNDERPAD 30X36 HEAVY ABSORB (UNDERPADS AND DIAPERS) ×2 IMPLANT
WARMER LAPAROSCOPE (MISCELLANEOUS) ×2 IMPLANT

## 2023-11-13 NOTE — Progress Notes (Signed)
Received report from [place], [place] in PACU. Pt arrived to room AOx4. Complaining of nausea. Pharmacy notified to send phenergan.

## 2023-11-13 NOTE — Progress Notes (Signed)
Pt came to the unit from PACU with d'c medication, promethazine, in a sealed bag. I gave the medication to night shift, Dyan, RN to put in pt's bin until pt is d'c tomorrow.

## 2023-11-13 NOTE — Progress Notes (Signed)
Dr. Lorriane Shire will place the order for zofran.

## 2023-11-13 NOTE — Progress Notes (Signed)
Notified Dr. Lorriane Shire and CN Cameron Proud, RN of abnormal BP. Pt doesn't have anything for BP currently to give.

## 2023-11-13 NOTE — Consult Note (Signed)
[facility] Renal Consultation Note    Indication for Consultation:  Management of ESRD/hemodialysis, anemia, hypertension/volume, and secondary hyperparathyroidism.  HPI: Jane [name] is a 47 y.o. female with PMH including ESRD on dialysis, asthma, CAD, T2DM, and abnormal uterine bleeding who presented today for planned hysterectomy. Nephrology has been consulted for management of ESRD during admission. Patient typically attends HD on MWF schedule. Due to the holiday, outpatient schedule is Sunday, Tuesday, Friday this week and she did attend HD on 12/29. Labs today notable for K+ 4.4, BUN 40, Cr 10, Hgb 14.3. O2 sat stable on RA. Surgery reportedly without complications.  Patient seen in her room post op. Reports she is very nauseated, noted antiemetics already ordered. She was very hypertensive in the PACU and was given hydralazine and labetolol. Noted BP running high outpatient as well and EDW was lowered. She denies SOB, CP, dizziness, HA, blurry vision, abdominal pain, diarrhea, dysuria, edema. No fever/chills reported. She denies any concerns other than nausea today, not currently having any pain.   Past Medical History:  Diagnosis Date   Abnormal uterine bleeding (AUB)    Anxiety    Arthritis of knee    Left, Gel and cortisone injections @ [facility]   Asthma    prn inhaler   Bipolar disorder (HCC)    CAD S/P BMS PCI to prox LAD cardiologist--- dr Judithann Sauger LAD to Mid LAD lesion, 90% stenosed. Post intervention - Vision BMS 3.0 mm x 18 mm (~3.5 mm) there is a 0% residual stenosis. 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   left 2018; right 2019   Depression    Diabetic foot ulcer (HCC) 04/22/2019   Diabetic neuropathy (HCC)    feet   Diabetic retinopathy, nonproliferative, severe (HCC)    bilateral   Dyspnea    with too much fluid   ESRD (end stage renal  disease) on dialysis [facility])    ?chronic interstitial nephritis  ([facility]   H/O non-ST elevation myocardial infarction (NSTEMI) 03/2016   Found in 90% mid LAD lesion treated with bare-metal stent (BMS) PCI - vision BMS 3.0 mm x 18 mm   Heart murmur    History of blood transfusion    History of MRSA infection 2007   Hyperlipidemia    Hypertension    IDA (iron deficiency anemia)    Insulin dependent type 2 diabetes mellitus (HCC)    Iron deficiency anemia    takes iron supplement   Myocardial infarction [facility])    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   PAOD (peripheral arterial occlusive disease) (HCC)    vascular--- dr Imogene Burn   Pneumonia 11/08/2021   PONV (postoperative nausea and vomiting)    S/P arteriovenous (AV) fistula creation 07/2017   S/p bare metal coronary artery stent 03/31/2016   BMS x1  to pLAD   Sickle cell trait (HCC)    Ulcer of left foot due to type 2 diabetes mellitus (HCC) 06/03/2019   Past Surgical History:  Procedure Laterality Date   A/V FISTULAGRAM N/A 04/13/2021   Procedure: A/V FISTULAGRAM - Right Arm;  Surgeon: Nada Libman, MD;  [facility] IN[facility];  Service: Cardiovascular;  Laterality: N/A;   ACHILLES TENDON LENGTHENING Left 11/24/2016   Procedure: Left Achilles tendon lengthening (open);  Surgeon: Toni Arthurs, MD;  Location: [facility];  Service: Orthopedics;  Laterality: Left;   AV FISTULA PLACEMENT Right 07/31/2017   Procedure: ARTERIOVENOUS BRACHIOCEPHALIC FISTULA CREATION RIGHT ARM;  Surgeon: Fransisco Hertz, MD;  Location: [facility] OR;  Service: Vascular;  Laterality: Right;   CALCANEAL OSTEOTOMY Left 11/24/2016   Procedure: Left hindfoot osteotomy and fusion;  Surgeon: Toni Arthurs, MD;  Location: [facility];  Service: Orthopedics;  Laterality: Left;   CARDIAC CATHETERIZATION N/A 03/31/2016   Procedure: Left Heart Cath and Coronary Angiography;  Surgeon: Runell Gess, MD;  Location: [facility] INVASIVE CV LAB: 90% early mLAD, normal LV Fxn   CARDIAC CATHETERIZATION N/A 03/31/2016   Procedure: Coronary Stent Intervention;  Surgeon: Runell Gess, MD;  Location: [facility];  Service: Cardiovascular: PCI to mLAD BMS Vision 3.0 mm x 18 mm   CESAREAN SECTION  1997   CYSTOSCOPY WITH FULGERATION N/A 08/30/2022   Procedure: CYSTOSCOPY WITH FULGERATION, BIOPSY, LEFT RETROGRADE PYELOGRAM;  Surgeon: Noel Christmas, MD;  Location: [facility];  Service: Urology;  Laterality: N/A;   DILITATION & CURRETTAGE/HYSTROSCOPY WITH NOVASURE ABLATION N/A 09/15/2020   Procedure: DILATATION & CURETTAGE/HYSTEROSCOPY;  Surgeon: Willodean Rosenthal, MD;  Location: [facility];  Service: Gynecology;  Laterality: N/A;   DILITATION & CURRETTAGE/HYSTROSCOPY WITH NOVASURE ABLATION N/A 12/21/2021   Procedure: DILATATION & CURETTAGE/HYSTEROSCOPY WITH NOVASURE ABLATION/ IUD REMOVAL;  Surgeon: Willodean Rosenthal, MD;  Location: [facility];  Service: Gynecology;  Laterality: N/A;   FISTULA SUPERFICIALIZATION Right 11/15/2017   Procedure: FISTULA SUPERFICIALIZATION RIGHT BRACHIOCEPHALIC  RIGHT;  Surgeon: Fransisco Hertz, MD;  Location[facility] OR;  Service: Vascular;  Laterality: Right;   FOOT SURGERY     multiple for charcot   PERIPHERAL VASCULAR BALLOON ANGIOPLASTY Right 04/13/2021   Procedure: PERIPHERAL VASCULAR BALLOON ANGIOPLASTY;  Surgeon: Nada Libman, MD;  Location: [facility];  Service: Cardiovascular;  Laterality: Right;  arm fistula   TOTAL KNEE ARTHROPLASTY Left 07/19/2023   Procedure: TOTAL KNEE ARTHROPLASTY;  Surgeon: Samson Frederic, MD;  Location: [facility];  Service: Orthopedics;  Laterality: Left;  150   TRANSTHORACIC ECHOCARDIOGRAM  02/2021   EF 55 to 60%.  No R WMA.  Mild septal LVH with moderate posterior hypertrophy.  Normal RV size and function.  Normal RVP/PASP.Marland Kitchen  Trivial MR.   TUBAL LIGATION  1997   Family History  Problem  Relation Age of Onset   Stroke Mother    Diabetes Mother    Hypertension Mother    Aneurysm Mother    Diabetes Father    Hypertension Father    Diabetes Sister    Hypertension Sister    Diabetes Maternal Grandmother    Diabetes Maternal Grandfather    Cancer Paternal Grandfather        Prostate   Social History:  reports that she quit smoking about 8 years ago. Her smoking use included cigarettes. She has never used smokeless tobacco. She reports that she does not currently use alcohol. She reports that she does not currently use drugs.  ROS: As per HPI otherwise negative.  Physical Exam: Vitals:   11/13/23 1037 11/13/23 1045 11/13/23 1100 11/13/23 1115  BP: (!) 171/114  (!) 227/98 (!) 228/108  Pulse: 92 91 91 93  Resp: 16 16 16 18   Temp: (!) 97.2 F (36.2 C)     TempSrc:      SpO2: 95%  100% 100%  Weight:      Height:  General: Well developed, well nourished, in no acute distress. Head: Normocephalic, atraumatic, sclera non-icteric, mucus membranes are moist. Lungs: Clear bilaterally to auscultation without wheezes, rales, or rhonchi. Breathing is unlabored. Heart: RRR with normal S1, S2. No murmurs, rubs, or gallops appreciated. Abdomen: Soft, non-distended with normoactive bowel sounds. Palpation deferred (post op) Musculoskeletal:  Strength and tone appear normal for age. Lower extremities: No edema b/l lower extremities Neuro: Alert and oriented X 3. Moves all extremities spontaneously. Psych:  Responds to questions appropriately with a normal affect. Dialysis Access: RUE AVF + t/b  Allergies  Allergen Reactions   Adhesive [Tape] Other (See Comments)    Irritation    Prior to Admission medications   Medication Sig Start Date End Date Taking? Authorizing Provider  acetaminophen (TYLENOL) 500 MG tablet Take 2 tablets (1,000 mg total) by mouth every 8 (eight) hours as needed for moderate pain, mild pain, fever or headache. 07/26/23  Yes Hill, Alain Honey, PA-C   albuterol (PROVENTIL HFA) 108 (90 Base) MCG/ACT inhaler Inhale 2 puffs into the lungs every 4 (four) hours as needed for coughing, wheezing, or shortness of breath. 12/26/22  Yes Lyndle Herrlich, MD  benzonatate (TESSALON) 100 MG capsule Take 1 capsule (100 mg total) by mouth 3 (three) times daily as needed for cough. 09/13/23  Yes Charlynne Pander, MD  cetirizine (ZYRTEC) 10 MG tablet Take 10 mg by mouth daily.   Yes [provider]  clindamycin (CLEOCIN) 300 MG capsule Take 1 capsule (300 mg total) by mouth in the morning and at bedtime for 7 days. 11/09/23 11/17/23 Yes Viviano Simas, FNP  dicyclomine (BENTYL) 10 MG capsule Take 1 capsule (10 mg total) by mouth 3 (three) times daily before meals. 03/13/23  Yes Lyndle Herrlich, MD  DULoxetine (CYMBALTA) 30 MG capsule Take 1 capsule (30 mg total) by mouth daily. 06/26/23 06/25/24 Yes Bender, Irving Burton, DO  gabapentin (NEURONTIN) 100 MG capsule Take 1 capsule (100 mg total) by mouth at bedtime. 03/03/22  Yes Gardenia Phlegm, MD  icosapent Ethyl (VASCEPA) 1 g capsule Take 2 capsules (2 g total) by mouth 2 (two) times daily. 06/14/23  Yes West, Katlyn D, NP  insulin degludec (TRESIBA) 100 UNIT/ML FlexTouch Pen Inject 24 Units into the skin at bedtime. 03/13/23  Yes Lyndle Herrlich, MD  insulin lispro (HUMALOG KWIKPEN) 100 UNIT/ML KwikPen Use 6-20 units as directed before meals three times a day 03/16/23  Yes Sridharan, Sriramkumar, MD  lanthanum (FOSRENOL) 1000 MG chewable tablet Chew 1 tablet (1,000 mg total) by mouth 3 (three) times daily with meals. 08/11/21  Yes   leflunomide (ARAVA) 10 MG tablet Take 1 tablet (10 mg total) by mouth daily. 09/14/23  Yes   Multiple Vitamins-Minerals (HAIR SKIN & NAILS) TABS Take 1 tablet by mouth daily.   Yes [provider]  multivitamin (RENA-VIT) TABS tablet Take 1 tablet by mouth daily. 05/13/22  Yes Gust Rung, DO  oxyCODONE-acetaminophen (PERCOCET) 10-325 MG tablet Take 1 tablet by  mouth 5 (five) times daily as needed. 10/30/23  Yes   polyethylene glycol (MIRALAX / GLYCOLAX) 17 g packet Take 17 g by mouth daily as needed for moderate constipation.   Yes [provider]  promethazine (PHENERGAN) 12.5 MG tablet Take 1 tablet (12.5 mg total) by mouth every 6 (six) hours as needed for nausea or vomiting. 07/26/23  Yes Hill, Alain Honey, PA-C  promethazine (PHENERGAN) 12.5 MG tablet Take 1 tablet (12.5 mg total) by mouth every 6 (six) hours as needed  for nausea or vomiting. 11/13/23  Yes Lorriane Shire, MD  rosuvastatin (CRESTOR) 40 MG tablet Take 1 tablet (40 mg total) by mouth daily. 06/14/23  Yes Reather Littler D, NP  sevelamer carbonate (RENVELA) 800 MG tablet Take 1,600-3,200 mg by mouth See admin instructions. Take 3200 mg with each meal and 1600 mg with each snack   Yes [provider]  Accu-Chek Softclix Lancets lancets Use to check blood sugar up to 3 (three) times daily. 05/23/22   Doran Stabler, DO  amLODipine-olmesartan (AZOR) 5-20 MG tablet Take 1 tablet by mouth every night Patient not taking: Reported on 09/06/2023 10/19/22     apixaban (ELIQUIS) 2.5 MG TABS tablet Take 1 tablet (2.5 mg total) by mouth 2 (two) times daily. Patient not taking: Reported on 09/06/2023 07/26/23   Clois Dupes, PA-C  blood glucose meter kit and supplies Dispense based on patient and insurance preference. Use up to four times daily as directed. (FOR ICD-10 E10.9, E11.9). Patient not taking: Reported on 09/06/2023 01/29/21   Evlyn Kanner, MD  blood glucose meter kit and supplies Use in the morning, at noon, and at bedtime. Patient not taking: Reported on 09/06/2023 03/13/23   Lyndle Herrlich, MD  Blood Glucose Monitoring Suppl (ACCU-CHEK GUIDE) w/Device KIT 1 each by Does not apply route 3 (three) times daily. Patient not taking: Reported on 09/06/2023 05/18/17   Nyra Market, MD  Continuous Glucose Receiver (DEXCOM G7 RECEIVER) DEVI Use  to continuously monitor your  blood sugars. 03/02/23   Lyndle Herrlich, MD  Continuous Glucose Sensor (DEXCOM G7 SENSOR) MISC Use it for 10 days to continuously monitor your blood sugars. 06/26/23   Faith Rogue, DO  estradiol (ESTRACE VAGINAL) 0.1 MG/GM vaginal cream Place 1 Applicatorful vaginally nightly for 2 weeks then 2 nights a week afterwards. Patient not taking: Reported on 09/06/2023 06/16/23   Lorriane Shire, MD  glucose blood (ACCU-CHEK GUIDE) test strip Use to check blood sugar up to 3 (three) times daily. Patient not taking: Reported on 09/06/2023 05/23/22   Doran Stabler, DO  Glucose Blood (BLOOD GLUCOSE TEST STRIPS) STRP Check blood sugars fasting and then with each meal up to 4 times a day. Patient not taking: Reported on 09/06/2023 03/13/23   Lyndle Herrlich, MD  injection device for insulin (INPEN 100-PINK-LILLY-HUMALOG) DEVI Use to inject Humalog insulin for meals and correction insulin 03/15/23   Lyndle Herrlich, MD  Insulin Pen Needle 32G X 4 MM MISC Use to inject insulin 4 (four) times daily. 03/03/22   Gardenia Phlegm, MD  Insulin Syringe-Needle U-100 31G X 15/64" 0.3 ML MISC Use to inject insulin daily 08/17/18   Earl Lagos, MD  Lancet Device MISC Use 1 each in the morning, at noon, and at bedtime. Patient not taking: Reported on 09/06/2023 03/13/23   Lyndle Herrlich, MD  Lancets [facility] DELICA PLUS [place]) MISC 1 each in the morning, at noon, and at bedtime. Patient not taking: Reported on 09/06/2023 03/13/23   Lyndle Herrlich, MD  leflunomide (ARAVA) 10 MG tablet Take 1 tablet (10 mg total) by mouth daily. Patient not taking: Reported on 10/31/2023 09/18/23     megestrol (MEGACE) 40 MG tablet Take 1 tablet (40 mg total) by mouth 2 (two) times daily. Can increase to two tablets twice a day in the event of heavy bleeding 09/06/23   Willodean Rosenthal, MD  methocarbamol (ROBAXIN) 500 MG tablet Take 1 tablet (500 mg total) by mouth every 6 (six) hours as needed  for muscle spasms. 08/07/23  metoCLOPramide (REGLAN) 10 MG tablet Take 1 tablet by mouth once a day as needed 10/04/23     metoprolol tartrate (LOPRESSOR) 25 MG tablet Take 1 tablet (25 mg total) by mouth 2 (two) times daily. Do not take the night before dialysis, and take 2 hours after dialysis. Patient not taking: Reported on 09/06/2023 03/16/23   Marykay Lex, MD  oxyCODONE-acetaminophen (PERCOCET) 10-325 MG tablet Take 1 tablet by mouth every 4 (four) hours as needed for pain. Patient not taking: Reported on 10/31/2023 07/26/23   Clois Dupes, PA-C  oxyCODONE-acetaminophen (PERCOCET) 10-325 MG tablet Take 1 tablet by mouth 5 (five) times daily as needed  08/02/23 Patient not taking: Reported on 09/06/2023 08/02/23     oxyCODONE-acetaminophen (PERCOCET) 10-325 MG tablet Take 1 tablet by mouth 5 (five) times daily as needed. Patient not taking: Reported on 09/06/2023 09/02/23     oxyCODONE-acetaminophen (PERCOCET) 10-325 MG tablet Take 1 tablet by mouth 5 (five) times daily as needed. Patient not taking: Reported on 10/31/2023 10/02/23     Continuous Blood Gluc Transmit (DEXCOM G6 TRANSMITTER) MISC Use to check blood sugar at least 6 times a day 03/03/22 03/13/23  Gardenia Phlegm, MD   Current Facility-Administered Medications  Medication Dose Route Frequency Provider Last Rate Last Admin   acetaminophen (OFIRMEV) 10 MG/ML IV            acetaminophen (OFIRMEV) IV 1,000 mg  1,000 mg Intravenous Once PRN Shelton Silvas, MD       acetaminophen (TYLENOL) tablet 325-650 mg  325-650 mg Oral Q4H PRN Shelton Silvas, MD       Or   acetaminophen (TYLENOL) 160 MG/5ML solution 325-650 mg  325-650 mg Oral Q4H PRN Shelton Silvas, MD       droperidol (INAPSINE) 2.5 MG/ML injection            fentaNYL (SUBLIMAZE) 100 MCG/2ML injection            fentaNYL (SUBLIMAZE) injection 25-50 mcg  25-50 mcg Intravenous Q5 min PRN Shelton Silvas, MD   50 mcg at 11/13/23 1121   labetalol (NORMODYNE) 5 MG/ML  injection            lactated ringers infusion   Intravenous Continuous Ajewole, Christana, MD       oxyCODONE (Oxy IR/ROXICODONE) immediate release tablet 5 mg  5 mg Oral Once PRN Shelton Silvas, MD       Or   oxyCODONE (ROXICODONE) 5 MG/5ML solution 5 mg  5 mg Oral Once PRN Shelton Silvas, MD       scopolamine (TRANSDERM-SCOP) 1 MG/3DAYS 1.5 mg  1 patch Transdermal Q72H Shelton Silvas, MD   1.5 mg at 11/13/23 4098   Facility-Administered Medications Ordered in Other Encounters  Medication Dose Route Frequency Provider Last Rate Last Admin   0.9 %  sodium chloride infusion   Intravenous Continuous PRN Shelton Silvas, MD   New Bag at 11/13/23 0700   dexamethasone (DECADRON) injection   Intravenous Anesthesia Intra-op Shelton Silvas, MD   4 mg at 11/13/23 0754   fentaNYL citrate (PF) (SUBLIMAZE) injection   Intravenous Anesthesia Intra-op Shelton Silvas, MD   100 mcg at 11/13/23 0741   lidocaine 2% (20 mg/mL) 5 mL syringe   Intravenous Anesthesia Intra-op Shelton Silvas, MD   50 mg at 11/13/23 1191   midazolam (VERSED) injection   Intravenous Anesthesia Intra-op Shelton Silvas, MD   2 mg at 11/13/23  0730   ondansetron (ZOFRAN) injection   Intravenous Anesthesia Intra-op Shelton Silvas, MD   4 mg at 11/13/23 1610   propofol (DIPRIVAN) 10 mg/mL bolus/IV push   Intravenous Anesthesia Intra-op Shelton Silvas, MD   140 mg at 11/13/23 0743   rocuronium [facility]) injection   Intravenous Anesthesia Intra-op Shelton Silvas, MD   40 mg at 11/13/23 0745   sugammadex sodium (BRIDION) injection   Intravenous Anesthesia Intra-op Shelton Silvas, MD   200 mg at 11/13/23 1019   Labs: Basic Metabolic Panel: Recent Labs  Lab 11/13/23 0638  NA 133*  K 4.4  CL 96*  GLUCOSE 176*  BUN 40*  CREATININE 10.00*   Liver Function Tests: No results for input(s): "AST", "ALT", "ALKPHOS", "BILITOT", "PROT", "ALBUMIN" in the last 168 hours. No results for input(s): "LIPASE", "AMYLASE" in the last 168  hours. No results for input(s): "AMMONIA" in the last 168 hours. CBC: Recent Labs  Lab 11/13/23 0638  HGB 14.3  HCT 42.0   Cardiac Enzymes: No results for input(s): "CKTOTAL", "CKMB", "CKMBINDEX", "TROPONINI" in the last 168 hours. CBG: Recent Labs  Lab 11/13/23 0622 11/13/23 1108  GLUCAP 173* 255*   Iron Studies: No results for input(s): "IRON", "TIBC", "TRANSFERRIN", "FERRITIN" in the last 72 hours. Studies/Results: No results found.  Dialysis Orders:  Center: [facility]  on MWF . 180Nre 3 hr 45 min BFR 450 DFR Auto 1.5 EDW 90.7kg 2K 2.5Ca AVF 15g No heparin Mircera IV q 2 weeks Hectorol IV q HD  Assessment/Plan:  S/p hysterectomy w/ salpingectomy: Mgt per admitting team  ESRD:  Typically on MWF schedule, on Sunday/Tuesday/Friday outpatient schedule due to holiday this week. Did attend HD Sunday, no emergent indications for HD today. Will plan for next HD tomorrow if she is still admitted  Hypertension/volume: BP running high lately, EDW being lowered outpatient. Given IV labetalol in PACU. If BP does not improve with UF, will need to start daily antihypertensive medications.  Does have a history of orthostatic hypotension so will use caution.   Anemia: Hgb currently at goal, no ESA indicated at this time.   Metabolic bone disease: Calcium elevated, will hold VDRA for now. Continue phos binders once tolerating PO  Nutrition:  Will need renal diet/fluid restrictions.   Rogers Blocker, PA-C 11/13/2023, 11:25 AM  [facility] Pager: [phone]

## 2023-11-13 NOTE — H&P (Signed)
OB/GYN Pre-Op History and Physical  Jane [name] is a 47 y.o. [id number] presenting for surgical management of AUB.       Past Medical History:  Diagnosis Date   Abnormal uterine bleeding (AUB)    Anxiety    Arthritis of knee    Left, Gel and cortisone injections @ [facility]   Asthma    prn inhaler   Bipolar disorder (HCC)    CAD S/P BMS PCI to prox LAD cardiologist--- dr Judithann Sauger LAD to Mid LAD lesion, 90% stenosed. Post intervention - Vision BMS 3.0 mm x 18 mm (~3.5 mm) there is a 0% residual stenosis. 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   left 2018; right 2019   Depression    Diabetic foot ulcer (HCC) 04/22/2019   Diabetic neuropathy (HCC)    feet   Diabetic retinopathy, nonproliferative, severe (HCC)    bilateral   Dyspnea    with too much fluid   ESRD (end stage renal disease) on dialysis [facility])    ?chronic interstitial nephritis  ([facility]   H/O non-ST elevation myocardial infarction (NSTEMI) 03/2016   Found in 90% mid LAD lesion treated with bare-metal stent (BMS) PCI - vision BMS 3.0 mm x 18 mm   Heart murmur    History of blood transfusion    History of MRSA infection 2007   Hyperlipidemia    Hypertension    IDA (iron deficiency anemia)    Insulin dependent type 2 diabetes mellitus (HCC)    Iron deficiency anemia    takes iron supplement   Myocardial infarction [facility])    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   PAOD (peripheral arterial occlusive disease) (HCC)    vascular--- dr Imogene Burn   Pneumonia 11/08/2021   PONV (postoperative nausea and vomiting)    S/P arteriovenous (AV) fistula creation 07/2017   S/p bare metal coronary artery stent 03/31/2016   BMS x1  to pLAD   Sickle cell trait (HCC)    Ulcer of left foot due to type 2 diabetes mellitus (HCC) 06/03/2019    Past Surgical History:  Procedure  Laterality Date   A/V FISTULAGRAM N/A 04/13/2021   Procedure: A/V FISTULAGRAM - Right Arm;  Surgeon: Nada Libman, MD;  [facility]: [facility];  Service: Cardiovascular;  Laterality: N/A;   ACHILLES TENDON LENGTHENING Left 11/24/2016   Procedure: Left Achilles tendon lengthening (open);  Surgeon: Toni Arthurs, MD;  Location: [facility];  Service: Orthopedics;  Laterality: Left;   AV FISTULA PLACEMENT Right 07/31/2017   Procedure: ARTERIOVENOUS BRACHIOCEPHALIC FISTULA CREATION RIGHT ARM;  Surgeon: Fransisco Hertz, MD;  Location: [facility] OR;  Service: Vascular;  Laterality: Right;   CALCANEAL OSTEOTOMY Left 11/24/2016   Procedure: Left hindfoot osteotomy and fusion;  Surgeon: Toni Arthurs, MD;  Location: [facility];  Service: Orthopedics;  Laterality: Left;   CARDIAC CATHETERIZATION N/A 03/31/2016   Procedure: Left Heart Cath and Coronary Angiography;  Surgeon: Runell Gess, MD;  Location: [name] INVASIVE CV LAB: 90% early mLAD, normal LV Fxn   CARDIAC CATHETERIZATION N/A 03/31/2016   Procedure: Coronary Stent Intervention;  Surgeon: Runell Gess, MD;  Location: [facility];  Service: Cardiovascular: PCI to mLAD BMS Vision 3.0 mm x 18 mm   CESAREAN SECTION  1997   CYSTOSCOPY WITH FULGERATION N/A  08/30/2022   Procedure: CYSTOSCOPY WITH FULGERATION, BIOPSY, LEFT RETROGRADE PYELOGRAM;  Surgeon: Noel Christmas, MD;  Location: [facility];  Service: Urology;  Laterality: N/A;   DILITATION & CURRETTAGE/HYSTROSCOPY WITH NOVASURE ABLATION N/A 09/15/2020   Procedure: DILATATION & CURETTAGE/HYSTEROSCOPY;  Surgeon: Willodean Rosenthal, MD;  Location: [facility];  Service: Gynecology;  Laterality: N/A;   DILITATION & CURRETTAGE/HYSTROSCOPY WITH NOVASURE ABLATION N/A 12/21/2021   Procedure: DILATATION & CURETTAGE/HYSTEROSCOPY WITH NOVASURE ABLATION/ IUD REMOVAL;  Surgeon: Willodean Rosenthal, MD;  Location: [facility];  Service:  Gynecology;  Laterality: N/A;   FISTULA SUPERFICIALIZATION Right 11/15/2017   Procedure: FISTULA SUPERFICIALIZATION RIGHT BRACHIOCEPHALIC  RIGHT;  Surgeon: Fransisco Hertz, MD;  Location: [facility];  Service: Vascular;  Laterality: Right;   FOOT SURGERY     multiple for charcot   PERIPHERAL VASCULAR BALLOON ANGIOPLASTY Right 04/13/2021   Procedure: PERIPHERAL VASCULAR BALLOON ANGIOPLASTY;  Surgeon: Nada Libman, MD;  Location: [facility];  Service: Cardiovascular;  Laterality: Right;  arm fistula   TOTAL KNEE ARTHROPLASTY Left 07/19/2023   Procedure: TOTAL KNEE ARTHROPLASTY;  Surgeon: Samson Frederic, MD;  Location: [facility] OR;  Service: Orthopedics;  Laterality: Left;  150   TRANSTHORACIC ECHOCARDIOGRAM  02-23-2021   EF 55 to 60%.  No R WMA.  Mild septal LVH with moderate posterior hypertrophy.  Normal RV size and function.  Normal RVP/PASP.Marland Kitchen  Trivial MR.   TUBAL LIGATION  1997    OB History  Gravida Para Term Preterm AB Living  2 2 1 1  0 2  SAB IAB Ectopic Multiple Live Births  0 0 0 0     # Outcome Date GA Lbr Len/2nd Weight Sex Type Anes PTL Lv  2 Preterm           1 Term             Social History   Socioeconomic History   Marital status: Single    Spouse name: Not on file   Number of children: 2   Years of education: 13   Highest education level: Not on file  Occupational History   Occupation:   Network engineer: [employer]  Tobacco Use   Smoking status: Former    Current packs/day: 0.00    Types: Cigarettes    Quit date: 02/28/2015    Years since quitting: 8.7   Smokeless tobacco: Never  Vaping Use   Vaping status: Never Used  Substance and Sexual Activity   Alcohol use: Not Currently    Comment: ~ 1 a month   Drug use: Not Currently    Comment: last use 2021-03-25   Sexual activity: Yes    Birth control/protection: Surgical  Other Topics Concern   Not on file  Social History Narrative   Patient does not drink caffeine.   Patient is right handed.        --> 2020 was a difficult year: Her sister died in Mar 25, 2024 (potentially because of COVID-19), her recent ex-boyfriend died in either 2024/02/24 or 2024-03-25, this was followed by one of her close friends being murdered by the boyfriend.    --> Good news for 2020 was that she had a new grandkids.   Social Drivers of Health   Financial Resource Strain: High Risk (06/26/2023)   Overall Financial Resource Strain (CARDIA)    Difficulty of Paying Living Expenses: Very hard  Food Insecurity: Food Insecurity Present (06/26/2023)   Hunger Vital Sign    Worried About  Running Out of Food in the Last Year: Often true    Ran Out of Food in the Last Year: Often true  Transportation Needs: Unmet Transportation Needs (06/26/2023)   PRAPARE - Transportation    Lack of Transportation (Medical): Yes    Lack of Transportation (Non-Medical): Yes  Physical Activity: Sufficiently Active (06/26/2023)   Exercise Vital Sign    Days of Exercise per Week: 4 days    Minutes of Exercise per Session: 40 min  Stress: Stress Concern Present (06/26/2023)   [employer] of Occupational Health - Occupational Stress Questionnaire    Feeling of Stress : Very much  Social Connections: Moderately Isolated (06/26/2023)   Social Connection and Isolation Panel [NHANES]    Frequency of Communication with Friends and Family: More than three times a week    Frequency of Social Gatherings with Friends and Family: More than three times a week    Attends Religious Services: 1 to 4 times per year    Active Member of [employer] or Organizations: No    Attends Engineer, structural: Never    Marital Status: Divorced    Family History  Problem Relation Age of Onset   Stroke Mother    Diabetes Mother    Hypertension Mother    Aneurysm Mother    Diabetes Father    Hypertension Father    Diabetes Sister    Hypertension Sister    Diabetes Maternal Grandmother    Diabetes Maternal Grandfather    Cancer Paternal Grandfather        Prostate     Facility-Administered Medications Prior to Admission  Medication Dose Route Frequency Provider Last Rate Last Admin   polyethylene glycol powder (GLYCOLAX/MIRALAX) container 255 g  1 Container Oral Once Crissie Sickles, MD       Medications Prior to Admission  Medication Sig Dispense Refill Last Dose/Taking   acetaminophen (TYLENOL) 500 MG tablet Take 2 tablets (1,000 mg total) by mouth every 8 (eight) hours as needed for moderate pain, mild pain, fever or headache. 30 tablet 0 Past Week   albuterol (PROVENTIL HFA) 108 (90 Base) MCG/ACT inhaler Inhale 2 puffs into the lungs every 4 (four) hours as needed for coughing, wheezing, or shortness of breath. 20.1 g 2 11/12/2023   benzonatate (TESSALON) 100 MG capsule Take 1 capsule (100 mg total) by mouth 3 (three) times daily as needed for cough. 21 capsule 0 Past Month   cetirizine (ZYRTEC) 10 MG tablet Take 10 mg by mouth daily.   11/12/2023   clindamycin (CLEOCIN) 300 MG capsule Take 1 capsule (300 mg total) by mouth in the morning and at bedtime for 7 days. 14 capsule 0 11/12/2023   dicyclomine (BENTYL) 10 MG capsule Take 1 capsule (10 mg total) by mouth 3 (three) times daily before meals. 90 capsule 0 11/12/2023   DULoxetine (CYMBALTA) 30 MG capsule Take 1 capsule (30 mg total) by mouth daily. 30 capsule 1 11/12/2023   gabapentin (NEURONTIN) 100 MG capsule Take 1 capsule (100 mg total) by mouth at bedtime. 90 capsule 1 11/12/2023   icosapent Ethyl (VASCEPA) 1 g capsule Take 2 capsules (2 g total) by mouth 2 (two) times daily. 180 capsule 1 11/12/2023   insulin degludec (TRESIBA) 100 UNIT/ML FlexTouch Pen Inject 24 Units into the skin at bedtime. 15 mL 1 11/12/2023   insulin lispro (HUMALOG KWIKPEN) 100 UNIT/ML KwikPen Use 6-20 units as directed before meals three times a day 15 mL 3 11/12/2023   lanthanum (FOSRENOL) 1000  MG chewable tablet Chew 1 tablet (1,000 mg total) by mouth 3 (three) times daily with meals. 270 tablet 4 11/12/2023    leflunomide (ARAVA) 10 MG tablet Take 1 tablet (10 mg total) by mouth daily. 30 tablet 2 Past Week   Multiple Vitamins-Minerals (HAIR SKIN & NAILS) TABS Take 1 tablet by mouth daily.   Past Week   multivitamin (RENA-VIT) TABS tablet Take 1 tablet by mouth daily. 90 tablet 3 11/12/2023   oxyCODONE-acetaminophen (PERCOCET) 10-325 MG tablet Take 1 tablet by mouth 5 (five) times daily as needed. 150 tablet 0 11/12/2023   polyethylene glycol (MIRALAX / GLYCOLAX) 17 g packet Take 17 g by mouth daily as needed for moderate constipation.   Past Week   promethazine (PHENERGAN) 12.5 MG tablet Take 1 tablet (12.5 mg total) by mouth every 6 (six) hours as needed for nausea or vomiting. 20 tablet 0 Past Week   rosuvastatin (CRESTOR) 40 MG tablet Take 1 tablet (40 mg total) by mouth daily. 90 tablet 1 11/12/2023   sevelamer carbonate (RENVELA) 800 MG tablet Take 1,600-3,200 mg by mouth See admin instructions. Take 3200 mg with each meal and 1600 mg with each snack   11/12/2023   Accu-Chek Softclix Lancets lancets Use to check blood sugar up to 3 (three) times daily. 300 each 3    amLODipine-olmesartan (AZOR) 5-20 MG tablet Take 1 tablet by mouth every night (Patient not taking: Reported on 09/06/2023) 90 tablet 3    apixaban (ELIQUIS) 2.5 MG TABS tablet Take 1 tablet (2.5 mg total) by mouth 2 (two) times daily. (Patient not taking: Reported on 09/06/2023) 60 tablet 0    blood glucose meter kit and supplies Dispense based on patient and insurance preference. Use up to four times daily as directed. (FOR ICD-10 E10.9, E11.9). (Patient not taking: Reported on 09/06/2023) 1 each 0    blood glucose meter kit and supplies Use in the morning, at noon, and at bedtime. (Patient not taking: Reported on 09/06/2023) 1 each 0    Blood Glucose Monitoring Suppl (ACCU-CHEK GUIDE) w/Device KIT 1 each by Does not apply route 3 (three) times daily. (Patient not taking: Reported on 09/06/2023) 1 kit 0    Continuous Glucose Receiver  (DEXCOM G7 RECEIVER) DEVI Use  to continuously monitor your blood sugars. 1 each 0    Continuous Glucose Sensor (DEXCOM G7 SENSOR) MISC Use it for 10 days to continuously monitor your blood sugars. 9 each 3    estradiol (ESTRACE VAGINAL) 0.1 MG/GM vaginal cream Place 1 Applicatorful vaginally nightly for 2 weeks then 2 nights a week afterwards. (Patient not taking: Reported on 09/06/2023) 42.5 g 4    glucose blood (ACCU-CHEK GUIDE) test strip Use to check blood sugar up to 3 (three) times daily. (Patient not taking: Reported on 09/06/2023) 300 each 3    Glucose Blood (BLOOD GLUCOSE TEST STRIPS) STRP Check blood sugars fasting and then with each meal up to 4 times a day. (Patient not taking: Reported on 09/06/2023) 100 strip 0    injection device for insulin (INPEN 100-PINK-LILLY-HUMALOG) DEVI Use to inject Humalog insulin for meals and correction insulin 1 each 1    Insulin Pen Needle 32G X 4 MM MISC Use to inject insulin 4 (four) times daily. 400 each 3    Insulin Syringe-Needle U-100 31G X 15/64" 0.3 ML MISC Use to inject insulin daily 100 each 3    Lancet Device MISC Use 1 each in the morning, at noon, and at bedtime. (Patient  not taking: Reported on 09/06/2023) 1 each 0    Lancets (ONETOUCH DELICA PLUS LANCET33G) MISC 1 each in the morning, at noon, and at bedtime. (Patient not taking: Reported on 09/06/2023) 100 each 0    leflunomide (ARAVA) 10 MG tablet Take 1 tablet (10 mg total) by mouth daily. (Patient not taking: Reported on 10/31/2023) 30 tablet 2 Not Taking   megestrol (MEGACE) 40 MG tablet Take 1 tablet (40 mg total) by mouth 2 (two) times daily. Can increase to two tablets twice a day in the event of heavy bleeding 60 tablet 5 More than a month   methocarbamol (ROBAXIN) 500 MG tablet Take 1 tablet (500 mg total) by mouth every 6 (six) hours as needed for muscle spasms. 20 tablet 0 More than a month   metoCLOPramide (REGLAN) 10 MG tablet Take 1 tablet by mouth once a day as needed 30 tablet  0 More than a month   metoprolol tartrate (LOPRESSOR) 25 MG tablet Take 1 tablet (25 mg total) by mouth 2 (two) times daily. Do not take the night before dialysis, and take 2 hours after dialysis. (Patient not taking: Reported on 09/06/2023) 180 tablet 0    oxyCODONE-acetaminophen (PERCOCET) 10-325 MG tablet Take 1 tablet by mouth every 4 (four) hours as needed for pain. (Patient not taking: Reported on 10/31/2023) 42 tablet 0 Not Taking   oxyCODONE-acetaminophen (PERCOCET) 10-325 MG tablet Take 1 tablet by mouth 5 (five) times daily as needed  08/02/23 (Patient not taking: Reported on 09/06/2023) 150 tablet 0    oxyCODONE-acetaminophen (PERCOCET) 10-325 MG tablet Take 1 tablet by mouth 5 (five) times daily as needed. (Patient not taking: Reported on 09/06/2023) 150 tablet 0    oxyCODONE-acetaminophen (PERCOCET) 10-325 MG tablet Take 1 tablet by mouth 5 (five) times daily as needed. (Patient not taking: Reported on 10/31/2023) 150 tablet 0 Not Taking    Allergies  Allergen Reactions   Adhesive [Tape] Other (See Comments)    Irritation     Review of Systems: Negative except for what is mentioned in HPI.     Physical Exam: BP (!) 173/84   Pulse 93   Temp 98.4 F (36.9 C) (Oral)   Resp 18   Ht 5\' 5"  (1.651 m)   Wt 91.2 kg   LMP  (LMP Unknown)   SpO2 100%   BMI 33.45 kg/m  CONSTITUTIONAL: Well-developed, well-nourished female in no acute distress.  HENT:  Normocephalic, atraumatic, External right and left ear normal. Oropharynx is clear and moist EYES: Conjunctivae and EOM are normal. Pupils are equal, round, and reactive to light. No scleral icterus.  NECK: Normal range of motion, supple, no masses SKIN: Skin is warm and dry. No rash noted. Not diaphoretic. No erythema. No pallor. NEUROLGIC: Alert and oriented to person, place, and time. Normal reflexes, muscle tone coordination. No cranial nerve deficit noted. PSYCHIATRIC: Normal mood and affect. Normal behavior. Normal judgment and  thought content. CARDIOVASCULAR: Normal heart rate noted, regular rhythm RESPIRATORY: Normal Effort  PELVIC: Deferred MUSCULOSKELETAL: Normal range of motion. No edema and no tenderness. 2+ distal pulses.   Pertinent Labs/Studies:   Results for orders placed or performed during the hospital encounter of 11/13/23 (from the past 72 hours)  Glucose, capillary     Status: Abnormal   Collection Time: 11/13/23  6:22 AM  Result Value Ref Range   Glucose-Capillary 173 (H) 70 - 99 mg/dL    Comment: Glucose reference range applies only to samples taken after fasting for at  least 8 hours.  I-STAT, chem 8     Status: Abnormal   Collection Time: 11/13/23  6:38 AM  Result Value Ref Range   Sodium 133 (L) 135 - 145 mmol/L   Potassium 4.4 3.5 - 5.1 mmol/L   Chloride 96 (L) 98 - 111 mmol/L   BUN 40 (H) 6 - 20 mg/dL   Creatinine, Ser 52.84 (H) 0.44 - 1.00 mg/dL   Glucose, Bld 132 (H) 70 - 99 mg/dL    Comment: Glucose reference range applies only to samples taken after fasting for at least 8 hours.   Calcium, Ion 1.20 1.15 - 1.40 mmol/L   TCO2 28 22 - 32 mmol/L   Hemoglobin 14.3 12.0 - 15.0 g/dL   HCT 44.0 10.2 - 72.5 %   *Note: Due to a large number of results and/or encounters for the requested time period, some results have not been displayed. A complete set of results can be found in Results Review.       Assessment and Plan :BRIGITTE HOPP is a 47 y.o. Jane [name] here for surgical management of AUB.   Patient desires surgical management with TLH, BS, CYSTO.  The risks of surgery were discussed in detail with the patient including but not limited to: bleeding which may require transfusion or reoperation; infection which may require prolonged hospitalization or re-hospitalization and antibiotic therapy; injury to bowel, bladder, ureters and major vessels or other surrounding organs which may lead to other procedures; formation of adhesions; need for additional procedures including laparotomy  or subsequent procedures secondary to intraoperative injury or abnormal pathology; thromboembolic phenomenon; incisional problems and other postoperative or anesthesia complications.  Patient was told that the likelihood that her condition and symptoms will be treated effectively with this surgical management was high; the postoperative expectations were also discussed in detail. The patient also understands the alternative treatment options which were discussed in full. All questions were answered.  She was told that she will be contacted by our surgical scheduler regarding the time and date of her surgery; routine preoperative instructions will be given to her by the preoperative nursing team.    Printed patient education handouts about the procedure were given to the patient to review at home.    Lorriane Shire, M.D. Minimally Invasive Gynecologic Surgery and Pelvic Pain Specialist Attending Obstetrician & Gynecologist, [facility] for [employer], [facility]

## 2023-11-13 NOTE — Brief Op Note (Signed)
11/13/2023  10:16 AM  PATIENT:  Jane [name]  47 y.o. female  PRE-OPERATIVE DIAGNOSIS:  Abnormal bleeding, Fibroid, endometrial thickening  POST-OPERATIVE DIAGNOSIS:  Abnormal bleeding, Fibroid, endometrial thickening  PROCEDURE:  Procedure(s): TOTAL LAPAROSCOPIC HYSTERECTOMY WITH SALPINGECTOMY (Bilateral) CYSTOSCOPY (N/A)  SURGEON:  Surgeons and Role:    Lorriane Shire, MD - Primary    * Belleair Shore Bing, MD - Assisting  PHYSICIAN ASSISTANT: Clement Sayres, PA  ASSISTANTS: above   ANESTHESIA:   local and general  EBL:  20 mL   BLOOD ADMINISTERED:none  DRAINS: none   LOCAL MEDICATIONS USED:  BUPIVICAINE   SPECIMEN:  Source of Specimen:  uterus, cervix, bilateral fallopian tubes  DISPOSITION OF SPECIMEN:  PATHOLOGY  COUNTS:  YES  TOURNIQUET:  * No tourniquets in log *  DICTATION: .Note written in EPIC  PLAN OF CARE: Admit for overnight observation  PATIENT DISPOSITION:  PACU - hemodynamically stable.   Delay start of Pharmacological VTE agent (>24hrs) due to surgical blood loss or risk of bleeding: not applicable

## 2023-11-13 NOTE — Anesthesia Procedure Notes (Signed)
Procedure Name: Intubation Date/Time: 11/13/2023 7:48 AM  Performed by: Shelton Silvas, MDPre-anesthesia Checklist: Patient identified, Emergency Drugs available, Suction available, Patient being monitored and Timeout performed Patient Re-evaluated:Patient Re-evaluated prior to induction Oxygen Delivery Method: Circle system utilized Preoxygenation: Pre-oxygenation with 100% oxygen Induction Type: IV induction Laryngoscope Size: Mac and 4 Grade View: Grade I Tube type: Oral Tube size: 7.0 mm Number of attempts: 1 Placement Confirmation: positive ETCO2, ETT inserted through vocal cords under direct vision, CO2 detector and breath sounds checked- equal and bilateral Secured at: 21 cm Tube secured with: Tape Dental Injury: Teeth and Oropharynx as per pre-operative assessment

## 2023-11-13 NOTE — Op Note (Signed)
Jane [name] PROCEDURE DATE: 11/13/2023  PREOPERATIVE DIAGNOSIS: abnormal uterine bleeding  POSTOPERATIVE DIAGNOSIS: abnormal uterine bleeding PROCEDURE:    total laparoscopic hysterectomy, bilateral salpingectomy, cystoscopy SURGEON: Lorriane Shire, MD ASSISTANT:  Lomita Bing, MD; Clement Sayres, [place]   An experienced assistant was required given the standard of surgical care given the complexity of the case.  This assistant was needed for exposure, dissection, suctioning, retraction, instrument exchange, and for overall help during the procedure.  INDICATIONS: 47 y.o. [id number] with AUB.  Risks of surgery were discussed with the patient including but not limited to: bleeding which may require transfusion; infection which may require antibiotics; injury to surrounding organs; need for additional procedures including laparotomy;  and other postoperative/anesthesia complications. Written informed consent was obtained.    FINDINGS:  Normal external genitalia, 8 wk size mobile uterus with Normal contours.  Laparoscopically: normal upper abdominal survey, normal appearing uterus with adhesion of colonic epipolica to the uterine surface on right anterior fundus, surgically ligated fallopian tubes, normal bilateral ovaries, bilateral ureters seen, mild thickening of vesicouterine junction in anterior cul de sac, normal posterior cul de sac Cystoscopically: normal bladder wall without apparent injury, bilateral ureteral orifices, peristalsis of ureteral orifice without output   ANESTHESIA: General INTRAVENOUS FLUIDS:  500 ml of LR ESTIMATED BLOOD LOSS:  20 ml URINE OUTPUT: 5 ml SPECIMENS: uterus, cervix, bilateral fallopian tubes COMPLICATIONS:  None immediate.  PROCEDURE: The risks, benefits, and alternatives of surgery were explained, understood, and accepted. Consents were signed. All questions were answered. She was taken to the operating room and general anesthesia was applied without  complication. She was placed in the dorsal lithotomy position and her abdomen and vagina were prepped and draped after she had been carefully positioned on the table. A bimanual exam revealed a 8 week size uterus that was mobile. Her adnexa were not enlarged. A Foley catheter was placed and it drained clear throughout the case. A speculum was placed and the cervix visualized. The cervix was measured and the uterus was sounded to 7 cm and a suture placed on the anterior lip of the cervix. A Rumi uterine manipulator using a 3cm cup and 6cm tip was placed without difficulty.  Gloves were changed and attention was turned to the abdomen. All incisions were infiltrated with local anesthetic. A 5mm incision was made in the umbilicus and an optiview trocar was introduced into the abdomen. The Entry was confirmed with low opening intraabdominal pressure and visualization and the abdomen was then insufflated. After good pneumoperitoneum was established, the abdomen was surveyed including the upper abdomen.She was placed in Trendelenburg position and ports were placed in the RLQ, LLQ, and an 8mm airseal trocar in the RUQ.   The adhesion along the uterine fundus was sharply removed using ligasure device. The right mesosalpinx was serially cauterized and cut and the tube removed from the abdomen. The uteroovarian ligament was cauterized and divided. The round ligament was divided. The leaves of the broad ligament were separated and the anterior leaf dissected down to the level of the Koh ring. The posterior leaf was dissected and the uterine vessels were skeletonized. The uterine vessels were cauterized, divided, and lateralized to the cup edge. Attention was turned to the left side where the same was completed. The bladder was pushed from the operative site and the tissue dissected down to the pubocervical fascia. Once adequately mobilized, the colpotomy was completed using harmonic scalpel device and the uterus removed from  the pelvis.    The vaginal  cuff was then closed using 0 Vloc in a running fashion. The pelvis was irrigated. All pedicles were inspected. No bleeding was noted.   CO2 pressures were lowered to 7mm Hg.  Again, no bleeding was noted.  Ureters were noted deep in the pelvis to be peristalsing.  At this point the procedure was completed.  The remaining instruments were removed.  The patient was taken out of Trendelenburg positioning.    Attention was then turned the vagina and the cuff was inspected. No bleeding was noted. The Foley catheter was removed.  Cystoscopy was performed.  No sutures or bladder injuries were noted. There was peristalsis of the ureteral orifices noted but no fluid produced. The bag attached to the foley was noted to have minimal output following the case. Fluoroscein was given and there was no efflux of urine from the ureteral orifices. Noted that given her ESRD, not likely to have much, if any, urine produced during cystoscopy as noted by minimal output during case. To ensure the ureters were not compromised, the laparoscope was reintroduced into the abdomen and the ureters followed and noted to be lateral to the vaginal cuff and vermiculating. Decision made to conclude case.  Foley was left out after the cystoscopic fluid was drained and cystoscope removed.  The skin was then closed with subcuticular stitches of 4-0 Vicryl. The skin was cleansed Dermabond was applied. Sponge, lap, needle, instrument counts were correct x2. Patient tolerated the procedure very well. She was awakened from anesthesia, extubated and taken to recovery in stable condition.   Lorriane Shire, MD Minimally Invasive Gynecologic Surgery  Obstetrics and Gynecology, [facility], [facility] 11/13/2023

## 2023-11-13 NOTE — Discharge Instructions (Signed)
Post Op Hysterectomy Instructions Please read the instructions below. Refer to these instructions for the next few weeks. These instructions provide you with general information on caring for yourself after surgery. Your caregiver may also give you specific instructions. While your treatment has been planned according to the most current medical practices available, unavoidable problems sometimes happen. If you have any problems or questions after you leave, please call your caregiver.  HOME CARE INSTRUCTIONS Healing will take time. You will have discomfort, tenderness, swelling and bruising at the operative site for a couple of weeks. This is normal and will get better as time goes on.  Only take over-the-counter or prescription medicines for pain, discomfort or fever as directed by your caregiver.  Do not take aspirin. It can cause bleeding.  Do not drive when taking pain medication.  Follow your caregiver's advice regarding diet, exercise, lifting, driving and general activities.  Resume your usual diet as directed and allowed.  Get plenty of rest and sleep.  Do not douche, use tampons, or have sexual intercourse until your caregiver gives you permission. .  Take your temperature if you feel hot or flushed.  You may shower today when you get home.  No tub bath for one week.   Do not drink alcohol until you are not taking any narcotic pain medications.  Try to have someone home with you for a week or two to help with the household activities.   Be careful over the next two to three weeks with any activities at home that involve lifting, pushing, or pulling.  Listen to your body--if something feels uncomfortable to do, then don't do it. Make sure you and your family understands everything about your operation and recovery.  Walking up stairs is fine. Do not sign any legal documents until you feel normal again.  Keep all your follow-up appointments as recommended by your caregiver.   PLEASE CALL  THE OFFICE IF: There is swelling, redness or increasing pain in the wound area.  Pus is coming from the wound.  You notice a bad smell from the wound or surgical dressing.  You have pain, redness and swelling from the intravenous site.  The wound is breaking open (the edges are not staying together).   You develop pain or bleeding when you urinate.  You develop abnormal vaginal discharge.  You have any type of abnormal reaction or develop an allergy to your medication.  You need stronger pain medication for your pain   SEEK IMMEDIATE MEDICAL CARE: You develop a temperature of 100.5 or higher.  You develop abdominal pain.  You develop chest pain.  You develop shortness of breath.  You pass out.  You develop pain, swelling or redness of your leg.  You develop heavy vaginal bleeding with or without blood clots.   MEDICATIONS: Restart your regular medications BUT wait one week before restarting all vitamins and mineral supplements Use your pain medications as you typically would You may use an over the counter stool softener like Colace or Dulcolax to help with starting a bowel movement.  Start the day after you go home.  Warm liquids, fluids, and ambulation help too.  If you have not had a bowel movement in four days, you need to call the office.

## 2023-11-13 NOTE — Progress Notes (Signed)
Pt asked for soup and indigested it but vomited. She states that she still feels nauseated. I explained that phenergan is every 6 hours. Pt stated earlier that zofran makes her vomit, but now she is requesting zofran. Notified Dr. Lorriane Shire and CN Cameron Proud, RN.

## 2023-11-13 NOTE — Progress Notes (Signed)
Notified by Cleaster Corin, NT that Pt's BP 206/101, yellow MEWS protocol implemented.

## 2023-11-13 NOTE — Anesthesia Postprocedure Evaluation (Signed)
Anesthesia Post Note  Patient: Jane [name]  Procedure(s) Performed: TOTAL LAPAROSCOPIC HYSTERECTOMY WITH SALPINGECTOMY (Bilateral: Abdomen) CYSTOSCOPY (Bladder)     Patient location during evaluation: PACU Anesthesia Type: General Level of consciousness: awake and alert Pain management: pain level controlled Vital Signs Assessment: post-procedure vital signs reviewed and stable Respiratory status: spontaneous breathing, nonlabored ventilation, respiratory function stable and patient connected to nasal cannula oxygen Cardiovascular status: blood pressure returned to baseline and stable Postop Assessment: no apparent nausea or vomiting Anesthetic complications: no  No notable events documented.  Last Vitals:  Vitals:   11/13/23 1235 11/13/23 1258  BP:  (!) 178/82  Pulse:  89  Resp:  16  Temp: (!) 36.4 C   SpO2:  98%    Last Pain:  Vitals:   11/13/23 1200  TempSrc:   PainSc: Asleep                 Shelton Silvas

## 2023-11-13 NOTE — Transfer of Care (Signed)
Immediate Anesthesia Transfer of Care Note  Patient: Jane [name]  Procedure(s) Performed: TOTAL LAPAROSCOPIC HYSTERECTOMY WITH SALPINGECTOMY (Bilateral: Abdomen) CYSTOSCOPY (Bladder)  Patient Location: PACU  Anesthesia Type:General  Level of Consciousness: awake, alert , and oriented  Airway & Oxygen Therapy: Patient Spontanous Breathing  Post-op Assessment: Report given to RN and Post -op Vital signs reviewed and stable  Post vital signs: Reviewed and stable  Last Vitals:  Vitals Value Taken Time  BP 228/108 11/13/23 1115  Temp 36.2 C 11/13/23 1037  Pulse 87 11/13/23 1125  Resp 0 11/13/23 1126  SpO2 69 % 11/13/23 1125  Vitals shown include unfiled device data.      Patients Stated Pain Goal: 4 (11/13/23 4098)  Complications: No notable events documented.

## 2023-11-14 ENCOUNTER — Encounter (HOSPITAL_COMMUNITY): Payer: Self-pay | Admitting: Obstetrics and Gynecology

## 2023-11-14 DIAGNOSIS — Z96652 Presence of left artificial knee joint: Secondary | ICD-10-CM | POA: Diagnosis present

## 2023-11-14 DIAGNOSIS — D573 Sickle-cell trait: Secondary | ICD-10-CM | POA: Diagnosis present

## 2023-11-14 DIAGNOSIS — I132 Hypertensive heart and chronic kidney disease with heart failure and with stage 5 chronic kidney disease, or end stage renal disease: Secondary | ICD-10-CM | POA: Diagnosis present

## 2023-11-14 DIAGNOSIS — E1122 Type 2 diabetes mellitus with diabetic chronic kidney disease: Secondary | ICD-10-CM | POA: Diagnosis present

## 2023-11-14 DIAGNOSIS — K219 Gastro-esophageal reflux disease without esophagitis: Secondary | ICD-10-CM | POA: Diagnosis present

## 2023-11-14 DIAGNOSIS — Z955 Presence of coronary angioplasty implant and graft: Secondary | ICD-10-CM | POA: Diagnosis not present

## 2023-11-14 DIAGNOSIS — N186 End stage renal disease: Secondary | ICD-10-CM | POA: Diagnosis present

## 2023-11-14 DIAGNOSIS — E1140 Type 2 diabetes mellitus with diabetic neuropathy, unspecified: Secondary | ICD-10-CM | POA: Diagnosis present

## 2023-11-14 DIAGNOSIS — Z87891 Personal history of nicotine dependence: Secondary | ICD-10-CM | POA: Diagnosis not present

## 2023-11-14 DIAGNOSIS — D631 Anemia in chronic kidney disease: Secondary | ICD-10-CM | POA: Diagnosis present

## 2023-11-14 DIAGNOSIS — I2510 Atherosclerotic heart disease of native coronary artery without angina pectoris: Secondary | ICD-10-CM | POA: Diagnosis present

## 2023-11-14 DIAGNOSIS — Z992 Dependence on renal dialysis: Secondary | ICD-10-CM | POA: Diagnosis not present

## 2023-11-14 DIAGNOSIS — N2581 Secondary hyperparathyroidism of renal origin: Secondary | ICD-10-CM | POA: Diagnosis present

## 2023-11-14 DIAGNOSIS — N939 Abnormal uterine and vaginal bleeding, unspecified: Secondary | ICD-10-CM | POA: Diagnosis present

## 2023-11-14 DIAGNOSIS — F319 Bipolar disorder, unspecified: Secondary | ICD-10-CM | POA: Diagnosis present

## 2023-11-14 DIAGNOSIS — E1151 Type 2 diabetes mellitus with diabetic peripheral angiopathy without gangrene: Secondary | ICD-10-CM | POA: Diagnosis present

## 2023-11-14 DIAGNOSIS — R112 Nausea with vomiting, unspecified: Secondary | ICD-10-CM | POA: Diagnosis present

## 2023-11-14 DIAGNOSIS — D251 Intramural leiomyoma of uterus: Secondary | ICD-10-CM | POA: Diagnosis present

## 2023-11-14 DIAGNOSIS — I5032 Chronic diastolic (congestive) heart failure: Secondary | ICD-10-CM | POA: Diagnosis present

## 2023-11-14 DIAGNOSIS — I252 Old myocardial infarction: Secondary | ICD-10-CM | POA: Diagnosis not present

## 2023-11-14 DIAGNOSIS — E113293 Type 2 diabetes mellitus with mild nonproliferative diabetic retinopathy without macular edema, bilateral: Secondary | ICD-10-CM | POA: Diagnosis present

## 2023-11-14 DIAGNOSIS — Z823 Family history of stroke: Secondary | ICD-10-CM | POA: Diagnosis not present

## 2023-11-14 DIAGNOSIS — Z8249 Family history of ischemic heart disease and other diseases of the circulatory system: Secondary | ICD-10-CM | POA: Diagnosis not present

## 2023-11-14 DIAGNOSIS — E785 Hyperlipidemia, unspecified: Secondary | ICD-10-CM | POA: Diagnosis present

## 2023-11-14 LAB — CBC
HCT: 37.7 % (ref 36.0–46.0)
Hemoglobin: 12 g/dL (ref 12.0–15.0)
MCH: 25.3 pg — ABNORMAL LOW (ref 26.0–34.0)
MCHC: 31.8 g/dL (ref 30.0–36.0)
MCV: 79.5 fL — ABNORMAL LOW (ref 80.0–100.0)
Platelets: 289 10*3/uL (ref 150–400)
RBC: 4.74 MIL/uL (ref 3.87–5.11)
RDW: 16.8 % — ABNORMAL HIGH (ref 11.5–15.5)
WBC: 9.6 10*3/uL (ref 4.0–10.5)
nRBC: 0 % (ref 0.0–0.2)

## 2023-11-14 LAB — RENAL FUNCTION PANEL
Albumin: 3.8 g/dL (ref 3.5–5.0)
Anion gap: 21 — ABNORMAL HIGH (ref 5–15)
BUN: 58 mg/dL — ABNORMAL HIGH (ref 6–20)
CO2: 20 mmol/L — ABNORMAL LOW (ref 22–32)
Calcium: 10.8 mg/dL — ABNORMAL HIGH (ref 8.9–10.3)
Chloride: 95 mmol/L — ABNORMAL LOW (ref 98–111)
Creatinine, Ser: 11.12 mg/dL — ABNORMAL HIGH (ref 0.44–1.00)
GFR, Estimated: 4 mL/min — ABNORMAL LOW (ref >60)
Glucose, Bld: 165 mg/dL — ABNORMAL HIGH (ref 70–99)
Phosphorus: 10.3 mg/dL — ABNORMAL HIGH (ref 2.5–4.6)
Potassium: 4.8 mmol/L (ref 3.5–5.1)
Sodium: 136 mmol/L (ref 135–145)

## 2023-11-14 LAB — GLUCOSE, CAPILLARY
Glucose-Capillary: 164 mg/dL — ABNORMAL HIGH (ref 70–99)
Glucose-Capillary: 147 mg/dL — ABNORMAL HIGH (ref 70–99)
Glucose-Capillary: 167 mg/dL — ABNORMAL HIGH (ref 70–99)

## 2023-11-14 LAB — SURGICAL PATHOLOGY

## 2023-11-14 MED ORDER — PENTAFLUOROPROP-TETRAFLUOROETH EX AERO
1.0000 | INHALATION_SPRAY | CUTANEOUS | Status: DC | PRN
Start: 2023-11-14 — End: 2023-11-14

## 2023-11-14 MED ORDER — HEPARIN SODIUM (PORCINE) 1000 UNIT/ML DIALYSIS: 1000.0000 [IU] | INTRAMUSCULAR | Status: DC | PRN

## 2023-11-14 MED ORDER — LIDOCAINE-PRILOCAINE 2.5-2.5 % EX CREA
1.0000 | TOPICAL_CREAM | CUTANEOUS | Status: DC | PRN
Start: 2023-11-14 — End: 2023-11-14

## 2023-11-14 MED ORDER — ANTICOAGULANT SODIUM CITRATE 4% (200MG/5ML) IV SOLN: 5.0000 mL | Status: DC | PRN

## 2023-11-14 MED ORDER — OMEPRAZOLE 20 MG PO TBDD
20.0000 mg | DELAYED_RELEASE_TABLET | Freq: Every day | ORAL | Status: DC
  Administered 2023-11-14 – 2023-11-16 (×3): 20 mg via ORAL
  Filled 2023-11-14 (×4): qty 1

## 2023-11-14 MED ORDER — HYDRALAZINE HCL 20 MG/ML IJ SOLN
10.0000 mg | Freq: Once | INTRAMUSCULAR | Status: AC
  Administered 2023-11-14: 10 mg via INTRAVENOUS
  Filled 2023-11-14: qty 1

## 2023-11-14 MED ORDER — METOCLOPRAMIDE HCL 5 MG/ML IJ SOLN
5.0000 mg | Freq: Once | INTRAMUSCULAR | Status: AC
Start: 2023-11-14 — End: 2023-11-14
  Administered 2023-11-14: 5 mg via INTRAVENOUS
  Filled 2023-11-14: qty 2

## 2023-11-14 MED ORDER — METOCLOPRAMIDE HCL 5 MG/ML IJ SOLN
5.0000 mg | Freq: Once | INTRAMUSCULAR | Status: AC
  Administered 2023-11-14: 5 mg via INTRAVENOUS
  Filled 2023-11-14: qty 2

## 2023-11-14 MED ORDER — LIDOCAINE HCL (PF) 1 % IJ SOLN: 5.0000 mL | INTRAMUSCULAR | Status: DC | PRN

## 2023-11-14 MED ORDER — ALTEPLASE 2 MG IJ SOLR: 2.0000 mg | Freq: Once | INTRAMUSCULAR | Status: DC | PRN

## 2023-11-14 NOTE — Progress Notes (Addendum)
 1 Day Post-Op Procedure(s) (LRB): TOTAL LAPAROSCOPIC HYSTERECTOMY WITH SALPINGECTOMY (Bilateral) CYSTOSCOPY (N/A)  Subjective: Patient reports nausea, vomiting, + BM, and no problems voiding.  Has not had relief with any of the anti-emetics given. Felt hungry briefly, ate applesauce and then got nauseous. Has not been able to tolerate any liquids either. Has been able to ambulate. Voided and had a BM. No dizziness with standing. Occasionally feeling hot. Denies chest pain, HA and SOB Did not have issue with PONV after her knee surgery Did not take BP meds x16 weeks (since her knee surgery) due to being hypotensive during that admission   Objective: I have reviewed patient's vital signs, intake and output, and medications.  General: alert, cooperative, and no distress Resp: normal effort GI: incision: clean, dry, and intact and soft  Assessment: s/p Procedure(s): TOTAL LAPAROSCOPIC HYSTERECTOMY WITH SALPINGECTOMY (Bilateral) CYSTOSCOPY (N/A): not tolerating diet  Plan: Follow up surgical pathology, now POD#1 s/p uncomplicated TLH, BS, cysto for AUB Trial of reglan  x 1 for nausea, not yet tolerating po, continued IV fluids at slow rate given has essentially been NPO but also ESRD patient  Nephrology following - anticipate HD today, will see how she feels following dialysis; follow up AM labs  Restarted amlodipine /ibersartan (azor  non-formulary) for BP and has required several doses of IV anti-hypertensives, hold restart of metoprolol  until after HD; appreciate nephrology input in managing HTN; will likely need follow up with PCP/neph regarding HTN management as she has been off meds x16 weeks  Continue insulin  for TIIDM  Pain well controlled; follows with pain management in ambulatory setting and will plan for d/c with just anti-emetics per patient request  Continue ambulation  LOS: 0 days    Carter Quarry, MD 11/14/2023, 7:33 AM pager [phone] cell phone  [phone]

## 2023-11-14 NOTE — Progress Notes (Signed)
 Pt receives out-pt HD at [facility] on MWF. Will assist as needed.   Olivia Canter Renal Navigator [phone]

## 2023-11-14 NOTE — Procedures (Signed)
 I was present at this dialysis session. I have reviewed the session itself and made appropriate changes.   Vital signs in last 24 hours:  Temp:  [97.2 F (36.2 C)-98.5 F (36.9 C)] 98.2 F (36.8 C) (12/31 0855) Pulse Rate:  [79-118] 104 (12/31 0855) Resp:  [9-24] 24 (12/31 0855) BP: (169-228)/(78-115) 184/87 (12/31 0855) SpO2:  [91 %-100 %] 98 % (12/31 0855) Weight:  [88 kg] 88 kg (12/31 0855) Weight change:  Filed Weights   11/13/23 0619 11/14/23 0855  Weight: 91.2 kg 88 kg    Recent Labs  Lab 11/13/23 0638  NA 133*  K 4.4  CL 96*  GLUCOSE 176*  BUN 40*  CREATININE 10.00*    Recent Labs  Lab 11/13/23 0638 11/14/23 0755  WBC  --  9.6  HGB 14.3 12.0  HCT 42.0 37.7  MCV  --  79.5*  PLT  --  289    Scheduled Meds:  amLODipine   5 mg Oral Daily   Chlorhexidine  Gluconate Cloth  6 each Topical Q0600   dicyclomine   10 mg Oral TID AC   DULoxetine   30 mg Oral Daily   gabapentin   100 mg Oral QHS   insulin  aspart  0-6 Units Subcutaneous TID WC   insulin  aspart  2 Units Subcutaneous TID WC   insulin  glargine-yfgn  24 Units Subcutaneous QHS   irbesartan   150 mg Oral Daily   Continuous Infusions:  anticoagulant sodium citrate      lactated ringers  40 mL/hr at 11/13/23 1319   promethazine  (PHENERGAN ) injection (IM or IVPB) 12.5 mg (11/13/23 1420)   PRN Meds:.oxyCODONE  **AND** acetaminophen , albuterol , alteplase , anticoagulant sodium citrate , heparin , HYDROmorphone  (DILAUDID ) injection, lidocaine  (PF), lidocaine -prilocaine , methocarbamol , pentafluoroprop-tetrafluoroeth, polyethylene glycol, prochlorperazine , promethazine  (PHENERGAN ) injection (IM or IVPB)   Fairy DELENA Sellar,  MD 11/14/2023, 9:11 AM

## 2023-11-14 NOTE — Progress Notes (Signed)
 1 Day Post-Op Procedure(s) (LRB): TOTAL LAPAROSCOPIC HYSTERECTOMY WITH SALPINGECTOMY (Bilateral) CYSTOSCOPY (N/A)  Subjective: Patient reports nausea.  Maybe felt a little better with reglan  but overall continuous nausea. Has been having hiccups and feels she vomits with the hiccuping after trying to eat.   Objective: I have reviewed patient's vital signs, intake and output, medications, labs, and pathology.  General: alert, cooperative, and no distress   Assessment: s/p Procedure(s): TOTAL LAPAROSCOPIC HYSTERECTOMY WITH SALPINGECTOMY (Bilateral) CYSTOSCOPY (N/A): stable and not tolerating diet  Plan: Advance diet Repeat reglan  for nausea Omeprazole  for hiccups  Has broth and popsicle at bedside to try Continue antihypertensives - will touch base with nephr regarding resumption of metoprolol    LOS: 0 days    Carter Quarry, MD 11/14/2023, 6:13 PM

## 2023-11-14 NOTE — Progress Notes (Signed)
   11/14/23 1307  Vitals  Pulse Rate 89  Resp (!) 21  BP (!) 157/74  SpO2 100 %  O2 Device Room Air  Weight 85.9 kg  Type of Weight Post-Dialysis  Oxygen Therapy  Patient Activity (if Appropriate) In bed  Pulse Oximetry Type Continuous  Oximetry Probe Site Changed No   Received patient in bed to unit.  Alert and oriented.  Informed consent signed and in chart.   TX duration: 3.5 hours  Patient tolerated well.  Transported back to the room  Alert, without acute distress.  Hand-off given to patient's nurse.   Access used: AVF Access issues: None  Total UF removed: 2L Medication(s) given: See MAR

## 2023-11-15 LAB — GLUCOSE, CAPILLARY
Glucose-Capillary: 124 mg/dL — ABNORMAL HIGH (ref 70–99)
Glucose-Capillary: 112 mg/dL — ABNORMAL HIGH (ref 70–99)
Glucose-Capillary: 100 mg/dL — ABNORMAL HIGH (ref 70–99)
Glucose-Capillary: 121 mg/dL — ABNORMAL HIGH (ref 70–99)
Glucose-Capillary: 140 mg/dL — ABNORMAL HIGH (ref 70–99)

## 2023-11-15 LAB — HEPATITIS B SURFACE ANTIBODY, QUANTITATIVE: Hep B S AB Quant (Post): 2724 m[IU]/mL

## 2023-11-15 MED ORDER — SEVELAMER CARBONATE 800 MG PO TABS
3200.0000 mg | ORAL_TABLET | Freq: Three times a day (TID) | ORAL | Status: DC
  Administered 2023-11-15 – 2023-11-16 (×2): 3200 mg via ORAL
  Filled 2023-11-15 (×2): qty 4

## 2023-11-15 MED ORDER — TRIMETHOBENZAMIDE HCL 100 MG/ML IM SOLN: 200.0000 mg | Freq: Four times a day (QID) | INTRAMUSCULAR | Status: DC | PRN

## 2023-11-15 MED ORDER — TRIMETHOBENZAMIDE HCL 100 MG/ML IM SOLN
200.0000 mg | Freq: Once | INTRAMUSCULAR | Status: AC
  Administered 2023-11-15: 200 mg via INTRAMUSCULAR
  Filled 2023-11-15: qty 2

## 2023-11-15 MED ORDER — FAMOTIDINE IN NACL 20-0.9 MG/50ML-% IV SOLN
20.0000 mg | INTRAVENOUS | Status: DC
  Administered 2023-11-15 – 2023-11-16 (×2): 20 mg via INTRAVENOUS
  Filled 2023-11-15 (×2): qty 50

## 2023-11-15 MED ORDER — FAMOTIDINE IN NACL 20-0.9 MG/50ML-% IV SOLN: 20.0000 mg | INTRAVENOUS | Status: DC

## 2023-11-15 MED ORDER — ALUM & MAG HYDROXIDE-SIMETH 200-200-20 MG/5ML PO SUSP
15.0000 mL | ORAL | Status: DC | PRN
  Administered 2023-11-15: 15 mL via ORAL
  Filled 2023-11-15: qty 30

## 2023-11-15 NOTE — Progress Notes (Signed)
 [facility] Progress Note   Subjective:   BP improved after HD yesterday. Patient is still very nauseated, reports she has not been able to eat. Reports dizziness associated with the nausea. Denies SOB, CP, palpitations, abdominal pain.   Objective Vitals:   11/14/23 1408 11/14/23 2030 11/15/23 0412 11/15/23 0822  BP: (!) 189/77 (!) 197/97 (!) 175/88 (!) 164/81  Pulse: 99 98 99 94  Resp: 18 16 16 16   Temp: 98.9 F (37.2 C) 98.3 F (36.8 C) 98.4 F (36.9 C) 98.4 F (36.9 C)  TempSrc: Oral Oral Oral Oral  SpO2: 99% 97% 98% 97%  Weight:      Height:       Physical Exam General: Tired appearing female, alert Heart: RRR, no murmurs, rubs or gallops Lungs: CTA bilaterally, respirations unlabored on RA Abdomen: Soft, +BS Extremities: No edema b/l lower extremities  Dialysis Access: RUE AVF +t/b  Additional Objective Labs: Basic Metabolic Panel: Recent Labs  Lab 11/13/23 0638 11/14/23 0755  NA 133* 136  K 4.4 4.8  CL 96* 95*  CO2  --  20*  GLUCOSE 176* 165*  BUN 40* 58*  CREATININE 10.00* 11.12*  CALCIUM   --  10.8*  PHOS  --  10.3*   Liver Function Tests: Recent Labs  Lab 11/14/23 0755  ALBUMIN 3.8   No results for input(s): LIPASE, AMYLASE in the last 168 hours. CBC: Recent Labs  Lab 11/13/23 0638 11/14/23 0755  WBC  --  9.6  HGB 14.3 12.0  HCT 42.0 37.7  MCV  --  79.5*  PLT  --  289   Blood Culture    Component Value Date/Time   SDES  06/14/2019 9077    WOUND LEFT FOOT Performed at [facility], [address], [place], [place] [address]    SPECREQUEST  06/14/2019 9077    NONE Performed at [facility], [address] [place], [place] [address]    BERNIS JEOFFREY ROA PNEUMONIAE 06/14/2019 9077   REPTSTATUS 06/17/2019 FINAL 06/14/2019 9077    Cardiac Enzymes: No results for input(s): CKTOTAL, CKMB, CKMBINDEX, TROPONINI in the last 168 hours. CBG: Recent Labs  Lab  11/13/23 2024 11/14/23 0824 11/14/23 1650 11/14/23 2032 11/15/23 0747  GLUCAP 150* 164* 147* 167* 124*   Iron Studies: No results for input(s): IRON, TIBC, TRANSFERRIN, FERRITIN in the last 72 hours. @lablastinr3 @ Studies/Results: No results found. Medications:  promethazine  (PHENERGAN ) injection (IM or IVPB) 12.5 mg (11/14/23 2051)    amLODipine   5 mg Oral Daily   Chlorhexidine  Gluconate Cloth  6 each Topical Q0600   dicyclomine   10 mg Oral TID AC   DULoxetine   30 mg Oral Daily   gabapentin   100 mg Oral QHS   insulin  aspart  0-6 Units Subcutaneous TID WC   insulin  aspart  2 Units Subcutaneous TID WC   insulin  glargine-yfgn  24 Units Subcutaneous QHS   irbesartan   150 mg Oral Daily   omeprazole   20 mg Oral Daily    Dialysis Orders: Center: [facility]  on MWF . 180Nre 3 hr 45 min BFR 450 DFR Auto 1.5 EDW 90.7kg 2K 2.5Ca AVF 15g No heparin  Mircera 200mcg IV q 2 weeks Hectorol 7mcg IV q HD    Assessment/Plan:  S/p hysterectomy w/ salpingectomy: Mgt per admitting team. Complicated by post op nausea  ESRD:  Typically on MWF schedule, completed HD on Sunday/Tuesday this week due to outpatient holiday schedule. Tentatively plan for next HD on Friday to resume regular  schedule.   Hypertension/volume: BP very high on admission, restarted irbvesartan and amlodipine . Had been off of anti-hypertensives since last admission. Improved post HD, needs lower EDW. Consider restarting metoprolol  if BP trends back up.   Anemia: Hgb currently at goal, no ESA indicated at this time.   Metabolic bone disease: Calcium  elevated, will hold VDRA for now. Phos 10.3, resumed renvela  4 tabs per meal  Nutrition:  Will need renal diet/fluid restrictions.   Lucie Collet, PA-C 11/15/2023, 8:27 AM  [facility] Pager: [phone]

## 2023-11-15 NOTE — Plan of Care (Signed)
   Problem: Education: Goal: Knowledge of General Education information will improve Description Including pain rating scale, medication(s)/side effects and non-pharmacologic comfort measures Outcome: Progressing

## 2023-11-15 NOTE — Plan of Care (Signed)
  Problem: Education: Goal: Knowledge of General Education information will improve Description: Including pain rating scale, medication(s)/side effects and non-pharmacologic comfort measures Outcome: Progressing   Problem: Health Behavior/Discharge Planning: Goal: Ability to manage health-related needs will improve Outcome: Progressing   Problem: Activity: Goal: Risk for activity intolerance will decrease Outcome: Progressing   Problem: Coping: Goal: Level of anxiety will decrease Outcome: Progressing   Problem: Elimination: Goal: Will not experience complications related to bowel motility Outcome: Progressing   Problem: Pain Management: Goal: General experience of comfort will improve Outcome: Progressing   Problem: Safety: Goal: Ability to remain free from injury will improve Outcome: Progressing   Problem: Skin Integrity: Goal: Risk for impaired skin integrity will decrease Outcome: Progressing

## 2023-11-15 NOTE — Progress Notes (Signed)
 2 Days Post-Op Procedure(s) (LRB): TOTAL LAPAROSCOPIC HYSTERECTOMY WITH SALPINGECTOMY (Bilateral) CYSTOSCOPY (N/A)  Subjective: Patient reports nausea.  Continues to have nausea though notes slight improvement in nausea. Continues to have hiccups - sensation of reflux as well. Mild dizziness as well   Objective: I have reviewed patient's vital signs, intake and output, and medications.  General: cooperative and no distress Resp: normal effort GI: normal findings: soft, non-tender and incision: clean, dry, and intact  Assessment: s/p Procedure(s): TOTAL LAPAROSCOPIC HYSTERECTOMY WITH SALPINGECTOMY (Bilateral) CYSTOSCOPY (N/A): stable and not ready for discharge  Plan: Continue IV fluids  as not yet tolerating po  Will try tigan  per pharm recs for nausea Add famotidine  for reflux  Renal to continue to follow - will restart metoprolol  if BP starts to increase again  Benign surgical pathology Continue ambulation, IS, SCDs for ppx   LOS: 1 day    Carter Quarry, MD 11/15/2023, 8:50 AM

## 2023-11-16 ENCOUNTER — Encounter (HOSPITAL_COMMUNITY): Payer: Self-pay | Admitting: Obstetrics and Gynecology

## 2023-11-16 ENCOUNTER — Other Ambulatory Visit (HOSPITAL_COMMUNITY): Payer: Self-pay

## 2023-11-16 ENCOUNTER — Encounter (HOSPITAL_COMMUNITY): Payer: Self-pay

## 2023-11-16 LAB — CBC
HCT: 42.3 % (ref 36.0–46.0)
Hemoglobin: 12.8 g/dL (ref 12.0–15.0)
MCH: 24.7 pg — ABNORMAL LOW (ref 26.0–34.0)
MCHC: 30.3 g/dL (ref 30.0–36.0)
MCV: 81.5 fL (ref 80.0–100.0)
Platelets: 279 10*3/uL (ref 150–400)
RBC: 5.19 MIL/uL — ABNORMAL HIGH (ref 3.87–5.11)
RDW: 17.2 % — ABNORMAL HIGH (ref 11.5–15.5)
WBC: 11.9 10*3/uL — ABNORMAL HIGH (ref 4.0–10.5)
nRBC: 0 % (ref 0.0–0.2)

## 2023-11-16 LAB — GLUCOSE, CAPILLARY
Glucose-Capillary: 72 mg/dL (ref 70–99)
Glucose-Capillary: 122 mg/dL — ABNORMAL HIGH (ref 70–99)
Glucose-Capillary: 117 mg/dL — ABNORMAL HIGH (ref 70–99)

## 2023-11-16 MED ORDER — METOCLOPRAMIDE HCL 5 MG PO TABS
5.0000 mg | ORAL_TABLET | Freq: Four times a day (QID) | ORAL | 0 refills | Status: DC | PRN
  Filled 2023-11-16: qty 120, 30d supply, fill #0
  Filled 2023-11-16: qty 90, 30d supply, fill #0

## 2023-11-16 MED ORDER — CHLORHEXIDINE GLUCONATE CLOTH 2 % EX PADS: 6.0000 | MEDICATED_PAD | Freq: Every day | CUTANEOUS | Status: DC

## 2023-11-16 MED ORDER — PROCHLORPERAZINE MALEATE 10 MG PO TABS
10.0000 mg | ORAL_TABLET | Freq: Four times a day (QID) | ORAL | 0 refills | Status: AC | PRN
  Filled 2023-11-16: qty 90, 23d supply, fill #0

## 2023-11-16 MED ORDER — ALUM & MAG HYDROXIDE-SIMETH 200-200-20 MG/5ML PO SUSP
15.0000 mL | ORAL | 0 refills | Status: AC | PRN
  Filled 2023-11-16: qty 355, 4d supply, fill #0

## 2023-11-16 MED ORDER — POLYETHYLENE GLYCOL 3350 17 GM/SCOOP PO POWD
17.0000 g | Freq: Every day | ORAL | 0 refills | Status: DC | PRN
  Filled 2023-11-16: qty 238, 14d supply, fill #0

## 2023-11-16 MED ORDER — OMEPRAZOLE 20 MG PO CPDR
20.0000 mg | DELAYED_RELEASE_CAPSULE | Freq: Every day | ORAL | 1 refills | Status: AC
  Filled 2023-11-16: qty 30, 30d supply, fill #0

## 2023-11-16 NOTE — Discharge Planning (Signed)
 [facility] Patient Discharge Orders- [facility] CLINIC: [facility]   Patient's name: Jane [name] Admit/DC Dates: 11/13/2023 - 11/16/2023  Discharge Diagnoses: Total hysterectomy   Nausea/vomiting hypertension  Aranesp : Given: no   Date and amount of last dose: n/a  Last Hgb: 12.8 PRBC's Given: no Date/# of units: n/a ESA dose for discharge: none- Hgb >12 IV Iron dose at discharge: none  Heparin  change: no  EDW Change: yes New EDW:  86kg  [place] Change: no  Access intervention/Change: no Details:  Hectorol/Calcitriol change: Yes- hold VDRA for hypercalcemia, recheck calcium  level in 1 week  Discharge Labs: Calcium  10.8 Phosphorus 10.3 Albumin 3.8 K+ 4.8  IV Antibiotics: no Details:  On Coumadin?: no Last INR: Next INR: Managed By:   OTHER/APPTS/LAB ORDERS: Hypertensive here- resumed irbesartan  and amlodipine  with improvement in BP    D/C Meds to be reconciled by nurse after every discharge.  Completed By: Lucie Collet, PA-C 11/16/2023, 1:40 PM  [facility] Pager: [phone]    Reviewed by: MD:______ RN_______

## 2023-11-16 NOTE — Care Management (Signed)
 Transition of Care [facility]) - Inpatient Brief Assessment   Patient Details  Name: Jane [name] MRN: [id number] Date of Birth: [birth year]  Transition of Care [facility]) CM/SW Contact:    Corean JAYSON Canary, RN Phone Number: 11/16/2023, 11:47 AM   Clinical Narrative:  48 year old HD patient presented with abnormal uterine bleeding post op N&V hysterectomy. SDOH assessment reveals finacial contraints and transportation issues. Information about [employer] dual transportation to AVS. No  other needs identified.     Transition of Care Asessment: Insurance and Status: Insurance coverage has been reviewed   Home environment has been reviewed: Apartment Prior level of function:: Ind on dialysis Prior/Current Home Services: No current home services Social Drivers of Health Review: SDOH reviewed needs interventions Readmission risk has been reviewed: Yes Transition of care needs: transition of care needs identified, TOC will continue to follow

## 2023-11-16 NOTE — Plan of Care (Signed)
  Problem: Education: Goal: Knowledge of General Education information will improve Description: Including pain rating scale, medication(s)/side effects and non-pharmacologic comfort measures Outcome: Adequate for Discharge   Problem: Health Behavior/Discharge Planning: Goal: Ability to manage health-related needs will improve Outcome: Adequate for Discharge   Problem: Clinical Measurements: Goal: Ability to maintain clinical measurements within normal limits will improve Outcome: Adequate for Discharge Goal: Will remain free from infection Outcome: Adequate for Discharge Goal: Diagnostic test results will improve Outcome: Adequate for Discharge Goal: Respiratory complications will improve Outcome: Adequate for Discharge Goal: Cardiovascular complication will be avoided Outcome: Adequate for Discharge   Problem: Activity: Goal: Risk for activity intolerance will decrease Outcome: Adequate for Discharge   Problem: Nutrition: Goal: Adequate nutrition will be maintained Outcome: Adequate for Discharge   Problem: Coping: Goal: Level of anxiety will decrease Outcome: Adequate for Discharge   Problem: Elimination: Goal: Will not experience complications related to bowel motility Outcome: Adequate for Discharge Goal: Will not experience complications related to urinary retention Outcome: Adequate for Discharge   Problem: Pain Management: Goal: General experience of comfort will improve Outcome: Adequate for Discharge   Problem: Safety: Goal: Ability to remain free from injury will improve Outcome: Adequate for Discharge   Problem: Skin Integrity: Goal: Risk for impaired skin integrity will decrease Outcome: Adequate for Discharge   Problem: Education: Goal: Ability to describe self-care measures that may prevent or decrease complications (Diabetes Survival Skills Education) will improve Outcome: Adequate for Discharge Goal: Individualized Educational Video(s) Outcome:  Adequate for Discharge   Problem: Coping: Goal: Ability to adjust to condition or change in health will improve Outcome: Adequate for Discharge   Problem: Fluid Volume: Goal: Ability to maintain a balanced intake and output will improve Outcome: Adequate for Discharge   Problem: Health Behavior/Discharge Planning: Goal: Ability to identify and utilize available resources and services will improve Outcome: Adequate for Discharge Goal: Ability to manage health-related needs will improve Outcome: Adequate for Discharge   Problem: Metabolic: Goal: Ability to maintain appropriate glucose levels will improve Outcome: Adequate for Discharge   Problem: Nutritional: Goal: Maintenance of adequate nutrition will improve Outcome: Adequate for Discharge Goal: Progress toward achieving an optimal weight will improve Outcome: Adequate for Discharge   Problem: Skin Integrity: Goal: Risk for impaired skin integrity will decrease Outcome: Adequate for Discharge   Problem: Tissue Perfusion: Goal: Adequacy of tissue perfusion will improve Outcome: Adequate for Discharge   Problem: Education: Goal: Knowledge of the prescribed therapeutic regimen will improve Outcome: Adequate for Discharge Goal: Understanding of sexual limitations or changes related to disease process or condition will improve Outcome: Adequate for Discharge Goal: Individualized Educational Video(s) Outcome: Adequate for Discharge   Problem: Self-Concept: Goal: Communication of feelings regarding changes in body function or appearance will improve Outcome: Adequate for Discharge   Problem: Skin Integrity: Goal: Demonstration of wound healing without infection will improve Outcome: Adequate for Discharge

## 2023-11-16 NOTE — Discharge Summary (Signed)
 Gynecology Physician Postoperative Discharge Summary  Patient ID: Jane [name] MRN: [id number] DOB/AGE: [birth year] 48 y.o.  Admit Date: 11/13/2023 Discharge Date: 11/16/2023  Preoperative Diagnoses: abnormal uterine bleeding Patient Active Problem List   Diagnosis Date Noted   Intramural leiomyoma of uterus 11/13/2023   Endometrial stripe increased 11/13/2023   Encounter for perioperative consultation 06/26/2023   Abnormal hematology test result 04/12/2023   Hematuria 05/26/2022   Irritable bowel syndrome (IBS) 06/03/2021   Diabetic neuropathy (HCC) 01/29/2021   Hypertensive heart disease with chronic diastolic congestive heart failure (HCC) 01/06/2021   Osteoarthritis of left knee 02/11/2019   Nausea in adult 11/30/2018   Hidradenitis suppurativa 02/12/2018   Anemia in chronic kidney disease 02/12/2018   Secondary hyperparathyroidism of renal origin (HCC) 01/30/2018   Major depressive disorder 08/11/2017   Sinus tachycardia 02/23/2017   ESRD (end stage renal disease) on dialysis (HCC) 01/05/2017   CAD S/P BMS PCI to prox LAD 03/31/2016   Abnormal uterine bleeding (AUB) 03/17/2016   PAOD (peripheral arterial occlusive disease) (HCC) 01/08/2016   Charcot foot due to diabetes mellitus (HCC) 09/07/2015   Essential hypertension 08/16/2014   Seasonal allergic rhinitis 08/16/2014   Morbid obesity (HCC) 09/26/2012   Hyperlipidemia associated with type 2 diabetes mellitus (HCC) 02/26/2009   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 [id number]  admitted for scheduled surgery.  She underwent the procedures as mentioned above, her operation was uncomplicated. For further details about surgery, please refer to the  operative report. Patient had elevated blood pressures in PACU and throughout postop day 0-1 requiring multiple IV anti-hypertensives. Her amlodipine ramiro were started. Patient unable to tolerate po on POD#1 due to nausea, was treated with phenergan , reglan , compazine , scopalamine patch, zofran , and tigan  with improvement on POD#2 and able to tolerate po on POD#3.Jane [name] By time of discharge on POD#3, her pain was controlled on oral pain medications; she was ambulating, voiding without difficulty, tolerating regular diet and passing flatus. She was deemed stable for discharge to home.   Significant Labs:    Latest Ref Rng & Units 11/14/2023    7:55 AM 11/13/2023    6:38 AM 11/06/2023    9:00 AM  CBC  WBC 4.0 - 10.5 K/uL 9.6   5.5   Hemoglobin 12.0 - 15.0 g/dL 87.9  85.6  88.3   Hematocrit 36.0 - 46.0 % 37.7  42.0  39.4   Platelets 150 - 400 K/uL 289   286     Discharge Exam: Blood pressure (!) 166/73, pulse 96, temperature (!) 97.5 F (36.4 C), temperature source Oral, resp. rate 18, height 5' 5 (1.651 m), weight 85.9 kg, SpO2 100%. General appearance: alert and no distress  Resp: clear to auscultation bilaterally  Cardio: regular rate and rhythm  GI: soft, non-tender; bowel sounds normal; no masses, no organomegaly.  Incision: C/D/I, no erythema, no drainage noted Pelvic: scant blood on pad (done in presence of RN as chaperone)  Extremities: extremities normal, atraumatic, no cyanosis or edema and Homans sign is negative, no sign of DVT  Discharged Condition: Stable  Disposition:    Allergies as of 11/16/2023       Reactions   Adhesive [tape] Other (See Comments)   Irritation        Medication List  STOP taking these medications    megestrol  40 MG tablet Commonly known as: MEGACE        TAKE these medications    Accu-Chek Guide test strip Generic drug: glucose blood Use to check blood sugar up to 3 (three) times daily.   OneTouch Verio test strip Generic  drug: glucose blood Check blood sugars fasting and then with each meal up to 4 times a day.   Accu-Chek Guide w/Device Kit 1 each by Does not apply route 3 (three) times daily.   Accu-Chek Softclix Lancets lancets Use to check blood sugar up to 3 (three) times daily.   OneTouch Delica Plus Lancet33G Misc 1 each in the morning, at noon, and at bedtime.   acetaminophen  500 MG tablet Commonly known as: TYLENOL  Take 2 tablets (1,000 mg total) by mouth every 8 (eight) hours as needed for moderate pain, mild pain, fever or headache.   albuterol  108 (90 Base) MCG/ACT inhaler Commonly known as: Proventil  HFA Inhale 2 puffs into the lungs every 4 (four) hours as needed for coughing, wheezing, or shortness of breath.   alum & mag hydroxide-simeth 200-200-20 MG/5ML suspension Commonly known as: MAALOX/MYLANTA Take 15 mLs by mouth every 4 (four) hours as needed for indigestion or heartburn.   amLODipine -olmesartan  5-20 MG tablet Commonly known as: AZOR  Take 1 tablet by mouth every night   BD Pen Needle Nano U/F 32G X 4 MM Misc Generic drug: Insulin  Pen Needle Use to inject insulin  4 (four) times daily.   benzonatate  100 MG capsule Commonly known as: TESSALON  Take 1 capsule (100 mg total) by mouth 3 (three) times daily as needed for cough.   blood glucose meter kit and supplies Dispense based on patient and insurance preference. Use up to four times daily as directed. (FOR ICD-10 E10.9, E11.9).   OneTouch Verio Flex System w/Device Kit Use in the morning, at noon, and at bedtime.   cetirizine  10 MG tablet Commonly known as: ZYRTEC  Take 10 mg by mouth daily.   clindamycin  300 MG capsule Commonly known as: Cleocin  Take 1 capsule (300 mg total) by mouth in the morning and at bedtime for 7 days.   Dexcom G7 Receiver Devi Use  to continuously monitor your blood sugars.   Dexcom G7 Sensor Misc Use it for 10 days to continuously monitor your blood sugars.   dicyclomine  10 MG  capsule Commonly known as: BENTYL  Take 1 capsule (10 mg total) by mouth 3 (three) times daily before meals.   DULoxetine  30 MG capsule Commonly known as: Cymbalta  Take 1 capsule (30 mg total) by mouth daily.   Eliquis  2.5 MG Tabs tablet Generic drug: apixaban  Take 1 tablet (2.5 mg total) by mouth 2 (two) times daily.   estradiol  0.1 MG/GM vaginal cream Commonly known as: ESTRACE  VAGINAL Place 1 Applicatorful vaginally nightly for 2 weeks then 2 nights a week afterwards.   gabapentin  100 MG capsule Commonly known as: NEURONTIN  Take 1 capsule (100 mg total) by mouth at bedtime.   Hair Skin & Nails Tabs Take 1 tablet by mouth daily.   HumaLOG  100 UNIT/ML cartridge Generic drug: insulin  lispro Inject 6-20 Units total into the skin 3 (three) times daily with meals. Use with inpen to inject at meal time and for correction insulin  up to 40 units daily   InPen 100-Pink-Lilly-Humalog  Devi Generic drug: injection device for insulin  Use to inject Humalog  insulin  for meals and correction insulin    Insulin  Syringe-Needle U-100 31G X 15/64 0.3 ML Misc Use to  inject insulin  daily   Lancet Device Misc Use 1 each in the morning, at noon, and at bedtime.   lanthanum  1000 MG chewable tablet Commonly known as: Fosrenol  Chew 1 tablet (1,000 mg total) by mouth 3 (three) times daily with meals.   leflunomide  10 MG tablet Commonly known as: ARAVA  Take 1 tablet (10 mg total) by mouth daily.   leflunomide  10 MG tablet Commonly known as: ARAVA  Take 1 tablet (10 mg total) by mouth daily.   methocarbamol  500 MG tablet Commonly known as: ROBAXIN  Take 1 tablet (500 mg total) by mouth every 6 (six) hours as needed for muscle spasms.   metoCLOPramide  10 MG tablet Commonly known as: Reglan  Take 1 tablet by mouth once a day as needed What changed: Another medication with the same name was added. Make sure you understand how and when to take each.   metoCLOPramide  5 MG tablet Commonly known  as: REGLAN  Take 1 tablet (5 mg total) by mouth every 6 (six) hours as needed. What changed: You were already taking a medication with the same name, and this prescription was added. Make sure you understand how and when to take each.   metoprolol  tartrate 25 MG tablet Commonly known as: LOPRESSOR  Take 1 tablet (25 mg total) by mouth 2 (two) times daily. Do not take the night before dialysis, and take 2 hours after dialysis.   multivitamin Tabs tablet Take 1 tablet by mouth daily.   omeprazole  20 MG capsule Commonly known as: PRILOSEC Take 1 capsule (20 mg total) by mouth daily. Start taking on: November 17, 2023   oxyCODONE -acetaminophen  10-325 MG tablet Commonly known as: Percocet Take 1 tablet by mouth every 4 (four) hours as needed for pain.   oxyCODONE -acetaminophen  10-325 MG tablet Commonly known as: PERCOCET Take 1 tablet by mouth 5 (five) times daily as needed  08/02/23   oxyCODONE -acetaminophen  10-325 MG tablet Commonly known as: PERCOCET Take 1 tablet by mouth 5 (five) times daily as needed.   oxyCODONE -acetaminophen  10-325 MG tablet Commonly known as: PERCOCET Take 1 tablet by mouth 5 (five) times daily as needed.   oxyCODONE -acetaminophen  10-325 MG tablet Commonly known as: PERCOCET Take 1 tablet by mouth 5 (five) times daily as needed.   polyethylene glycol 17 g packet Commonly known as: MIRALAX  / GLYCOLAX  Take 17 g by mouth daily as needed for moderate constipation. What changed: Another medication with the same name was added. Make sure you understand how and when to take each.   polyethylene glycol powder 17 GM/SCOOP powder Commonly known as: GLYCOLAX /MIRALAX  Mix as directed and take 1 capful (17 g) by mouth daily as needed for moderate constipation. What changed: You were already taking a medication with the same name, and this prescription was added. Make sure you understand how and when to take each.   prochlorperazine  10 MG tablet Commonly known as:  COMPAZINE  Take 1 tablet (10 mg total) by mouth every 6 (six) hours as needed for nausea or vomiting.   promethazine  12.5 MG tablet Commonly known as: PHENERGAN  Take 1 tablet (12.5 mg total) by mouth every 6 (six) hours as needed for nausea or vomiting.   rosuvastatin  40 MG tablet Commonly known as: CRESTOR  Take 1 tablet (40 mg total) by mouth daily.   sevelamer  carbonate 800 MG tablet Commonly known as: RENVELA  Take 1,600-3,200 mg by mouth See admin instructions. Take 3200 mg with each meal and 1600 mg with each snack   Tresiba  FlexTouch 100 UNIT/ML FlexTouch Pen Generic drug: insulin   degludec Inject 24 Units into the skin at bedtime.   Vascepa  1 g capsule Generic drug: icosapent  Ethyl Take 2 capsules (2 g total) by mouth 2 (two) times daily.       Future Appointments  Date Time Provider Department Center  12/11/2023  1:30 PM Bennett Renda [facility]     Total discharge time: 15 minutes   Signed:  Carter Quarry, MD Minimally Invasive Gynecologic Surgery and Chronic Pelvic Pain Specialist Obstetrics and Gynecology, [facility] for [facility], [facility] 11/16/23 pager [phone] cell phone [phone]

## 2023-11-16 NOTE — Progress Notes (Signed)
 Jane [name] Progress Note   Subjective:   Patient feeling much better today, was able to eat and having much less nausea. BP also improving. She denies CP, SOB, dizziness.   Objective Vitals:   11/15/23 2007 11/16/23 0515 11/16/23 0819 11/16/23 0925  BP: (!) 157/81 (!) 117/56 (!) 166/73 (!) 166/73  Pulse: 93 87 96   Resp: 19 17 18    Temp: 99.2 F (37.3 C) 97.8 F (36.6 C) (!) 97.5 F (36.4 C)   TempSrc: Oral Oral Oral   SpO2: 96% 98% 100%   Weight:      Height:       Physical Exam General: Alert female in NAD Heart: RRR, no murmurs, rubs or gallops Lungs: CTA bilaterally, respirations unlabored on RA Abdomen: Soft, non-distended, +BS Extremities: No edema b/l lower extremities Dialysis Access:  RUE AVF + t/b  Additional Objective Labs: Basic Metabolic Panel: Recent Labs  Lab 11/13/23 0638 11/14/23 0755  NA 133* 136  K 4.4 4.8  CL 96* 95*  CO2  --  20*  GLUCOSE 176* 165*  BUN 40* 58*  CREATININE 10.00* 11.12*  CALCIUM   --  10.8*  PHOS  --  10.3*   Liver Function Tests: Recent Labs  Lab 11/14/23 0755  ALBUMIN 3.8   No results for input(s): LIPASE, AMYLASE in the last 168 hours. CBC: Recent Labs  Lab 11/13/23 0638 11/14/23 0755 11/16/23 0823  WBC  --  9.6 11.9*  HGB 14.3 12.0 12.8  HCT 42.0 37.7 42.3  MCV  --  79.5* 81.5  PLT  --  289 279   Blood Culture    Component Value Date/Time   SDES  06/14/2019 9077    WOUND LEFT FOOT Performed at [facility], [address], [place], [place] [address]    SPECREQUEST  06/14/2019 9077    NONE Performed at [facility], [address] [place], [place] [address]    BERNIS JEOFFREY ROA PNEUMONIAE 06/14/2019 9077   REPTSTATUS 06/17/2019 FINAL 06/14/2019 9077    Cardiac Enzymes: No results for input(s): CKTOTAL, CKMB, CKMBINDEX, TROPONINI in the last 168 hours. CBG: Recent Labs  Lab 11/15/23 1713 11/15/23 2053 11/15/23 2222  11/16/23 0811 11/16/23 0859  GLUCAP 100* 121* 140* 72 122*   Iron Studies: No results for input(s): IRON, TIBC, TRANSFERRIN, FERRITIN in the last 72 hours. @lablastinr3 @ Studies/Results: No results found. Medications:  famotidine  (PEPCID ) IV 20 mg (11/16/23 0845)   promethazine  (PHENERGAN ) injection (IM or IVPB) Stopped (11/15/23 2236)    amLODipine   5 mg Oral Daily   Chlorhexidine  Gluconate Cloth  6 each Topical Q0600   dicyclomine   10 mg Oral TID AC   DULoxetine   30 mg Oral Daily   gabapentin   100 mg Oral QHS   insulin  aspart  0-6 Units Subcutaneous TID WC   insulin  aspart  2 Units Subcutaneous TID WC   insulin  glargine-yfgn  24 Units Subcutaneous QHS   irbesartan   150 mg Oral Daily   omeprazole   20 mg Oral Daily   sevelamer  carbonate  3,200 mg Oral TID WC    Dialysis Orders: Center: [facility]  on MWF . 180Nre 3 hr 45 min BFR 450 DFR Auto 1.5 EDW 90.7kg 2K 2.5Ca AVF 15g No heparin  Mircera 200mcg IV q 2 weeks Hectorol 7mcg IV q HD   Assessment/Plan:  S/p hysterectomy w/ salpingectomy: Mgt per admitting team. Complicated by post op nausea  ESRD:  Typically on MWF schedule, completed HD on Sunday/Tuesday this  week due to outpatient holiday schedule. Next HD tomorrow, can be done outpatient if patient discharges today.   Hypertension/volume: BP very high on admission, restarted irbvesartan and amlodipine . Had been off of anti-hypertensives since last admission. Improved post HD, needs lower EDW (86kg)  Anemia: Hgb currently above goal, no ESA indicated at this time.   Metabolic bone disease: Calcium  elevated, will hold VDRA for now. Phos 10.3, resumed renvela  4 tabs per meal  Nutrition:  Will need renal diet/fluid restrictions.   Lucie Collet, PA-C 11/16/2023, 9:58 AM  [name] Pager: [phone]

## 2023-11-16 NOTE — Progress Notes (Signed)
 D/C order noted. Contacted [facility] to be advised of pt's d/c today and that pt should resume care tomorrow.   Olivia Canter Renal Navigator [phone]

## 2023-11-16 NOTE — Progress Notes (Addendum)
 3 Days Post-Op Procedure(s) (LRB): TOTAL LAPAROSCOPIC HYSTERECTOMY WITH SALPINGECTOMY (Bilateral) CYSTOSCOPY (N/A)  Subjective: Patient reports tolerating PO, + flatus, + BM, and no problems voiding.  Much improved this AM and asked for graham crackers. Hiccups have nearly resolved.   Objective: I have reviewed patient's vital signs, intake and output, and medications.  General: alert, cooperative, and no distress Resp: normal respiratory effort GI: incision: clean, dry, and intact and soft, nontender abdomen   Assessment: s/p Procedure(s): TOTAL LAPAROSCOPIC HYSTERECTOMY WITH SALPINGECTOMY (Bilateral) CYSTOSCOPY (N/A): stable  Plan: Advance diet - tolerating po Encourage ambulation Discontinue IV fluids when bag finished Discharge home after labs and renal evaluation - likely return to MWF HD scheduled outpatient & resume azor  for BP control SCDs, IS, ambulation for ppx Will scheduled for outpatient follow up  Will d/c with anti-emetics   LOS: 2 days    Carter Quarry, MD 11/16/2023, 8:06 AM pager [phone] cell phone [phone]

## 2023-11-17 ENCOUNTER — Telehealth: Payer: Self-pay | Admitting: Nephrology

## 2023-11-17 ENCOUNTER — Encounter: Payer: Self-pay | Admitting: Obstetrics and Gynecology

## 2023-11-17 NOTE — Telephone Encounter (Signed)
 Transition of Care - Initial Contact from Inpatient Facility  Date of discharge: 11/16/23 Date of contact: 11/17/23 Method: Phone Spoke to: Patient  Patient contacted to discuss transition of care from recent inpatient hospitalization. Patient was admitted to [facility] from 11/13/23 - 11/16/23 with discharge diagnosis of total laparoscopic hysterectomy   The discharge medication list was reviewed.   Patient will return to his/her outpatient HD unit on: She went to her scheduled dialysis today 11/17/23  No other concerns at this time.

## 2023-11-18 ENCOUNTER — Telehealth: Payer: 59 | Admitting: Family Medicine

## 2023-11-18 NOTE — Progress Notes (Signed)
 Pt did not show for visit [facility]

## 2023-11-24 ENCOUNTER — Encounter: Payer: Self-pay | Admitting: Obstetrics and Gynecology

## 2023-11-24 ENCOUNTER — Other Ambulatory Visit (HOSPITAL_COMMUNITY): Payer: Self-pay

## 2023-11-27 ENCOUNTER — Other Ambulatory Visit: Payer: Self-pay | Admitting: Dietician

## 2023-11-27 DIAGNOSIS — E083413 Diabetes mellitus due to underlying condition with severe nonproliferative diabetic retinopathy with macular edema, bilateral: Secondary | ICD-10-CM

## 2023-11-27 NOTE — Telephone Encounter (Signed)
 Called patient per Dr. Sloan Leiter about [name] support. We decided she would send me an Inpen report and we'd schedule a follow up once that is analyzed. She also requests a 90 day refill on her Humalog cartridges for her Inpen.

## 2023-11-28 ENCOUNTER — Other Ambulatory Visit (HOSPITAL_COMMUNITY): Payer: Self-pay

## 2023-11-28 MED ORDER — HUMALOG 100 UNIT/ML ~~LOC~~ SOCT
6.0000 [IU] | Freq: Three times a day (TID) | SUBCUTANEOUS | 3 refills | Status: DC
  Filled 2023-11-28: qty 15, 25d supply, fill #0

## 2023-11-29 ENCOUNTER — Other Ambulatory Visit (HOSPITAL_COMMUNITY): Payer: Self-pay

## 2023-11-29 MED ORDER — OXYCODONE-ACETAMINOPHEN 10-325 MG PO TABS
1.0000 | ORAL_TABLET | Freq: Every day | ORAL | 0 refills | Status: DC | PRN
  Filled 2023-11-29 – 2023-11-30 (×2): qty 150, 30d supply, fill #0

## 2023-11-30 ENCOUNTER — Other Ambulatory Visit (HOSPITAL_COMMUNITY): Payer: Self-pay

## 2023-12-01 ENCOUNTER — Encounter (HOSPITAL_COMMUNITY): Payer: Self-pay

## 2023-12-01 ENCOUNTER — Ambulatory Visit (INDEPENDENT_AMBULATORY_CARE_PROVIDER_SITE_OTHER): Payer: 59 | Admitting: Obstetrics and Gynecology

## 2023-12-01 VITALS — BP 189/91 | HR 89 | Wt 201.0 lb

## 2023-12-01 DIAGNOSIS — N939 Abnormal uterine and vaginal bleeding, unspecified: Secondary | ICD-10-CM

## 2023-12-01 DIAGNOSIS — Z09 Encounter for follow-up examination after completed treatment for conditions other than malignant neoplasm: Secondary | ICD-10-CM

## 2023-12-01 NOTE — Progress Notes (Signed)
   POSTOPERATIVE VISIT NOTE   Subjective:     Jane [name] is a 48 y.o. [name] who presents to the clinic 2 weeks status post  TLH, BS, cysto  for abnormal uterine bleeding. Eating a regular diet without difficulty. Bowel movements are  normal/loose . The patient is not having any pain. Incision: healing well  Vaginal bleeding: minimal Resumed sexual acitivity: no  -intermittent nausea, much improved since admission' -has had [facility] services for years for ADLs, needs additional assistance while she has the lifting restrictions   The following portions of the patient's history were reviewed and updated as appropriate: allergies, current medications, past family history, past medical history, past social history, past surgical history, and problem list..   Review of Systems Pertinent items are noted in HPI.    Objective:    BP (!) 165/87 (BP Location: Left Arm, Patient Position: Sitting, Cuff Size: Large)   Pulse 92   Wt 201 lb (91.2 kg)   LMP  (LMP Unknown)   BMI 33.45 kg/m  General:  alert, cooperative, and no distress  Abdomen: soft, non-tender  Incision:   healing well, no drainage, no erythema, no hernia, no seroma, no swelling, no dehiscence, incision well approximated  Pelvic:   Exam deferred.    Pathology Results: FINAL MICROSCOPIC DIAGNOSIS:   A. UTERUS AND CERVIX, WITH BILATERAL FALLOPIAN TUBES, HYSTERECTOMY:  - Uterus with benign inactive endometrium  - Adenomyosis  - Endometriosis  - Benign unremarkable cervix  - Benign unremarkable bilateral fallopian tubes  - No evidence of malignancy    Assessment:   Doing well postoperatively. Operative findings again reviewed. Pathology report discussed.   Plan:   1. Postop check (Primary) Meeting postop goals   2. Abnormal uterine bleeding (AUB) Resolved    Activity restrictions: no lifting more than 10 pounds and pelvic rest x 8 weeks Follow up: at 8 weeks  Lorriane Shire, MD Obstetrician &  Gynecologist, [facility] for [employer], [facility]

## 2023-12-11 ENCOUNTER — Ambulatory Visit (INDEPENDENT_AMBULATORY_CARE_PROVIDER_SITE_OTHER): Payer: 59 | Admitting: Licensed Clinical Social Worker

## 2023-12-11 DIAGNOSIS — F321 Major depressive disorder, single episode, moderate: Secondary | ICD-10-CM | POA: Diagnosis not present

## 2023-12-11 NOTE — BH Specialist Note (Signed)
Integrated Behavioral Health via Telemedicine Visit  12/11/2023 Jane [name] [id number]  Number of Integrated Behavioral Health Clinician visits: Additional Visit  Session Start time: 1330   Session End time: 1430  Total time in minutes: 60   Referring Provider: Katheran James, DO Patient/Family location: Home [facility] Provider location: Office All persons participating in visit: [facility] and Patient Types of Service: Individual psychotherapy  I connected with Jane [name] via  Telephone  and verified that I am speaking with the correct person using two identifiers. Discussed confidentiality: Yes   I discussed the limitations of telemedicine and the availability of in person appointments.  Discussed there is a possibility of technology failure and discussed alternative modes of communication if that failure occurs.  I discussed that engaging in this telemedicine visit, they consent to the provision of behavioral healthcare and the services will be billed under their insurance.  Patient and/or legal guardian expressed understanding and consented to Telemedicine visit: Yes   Presenting Concerns: Patient and/or family reports the following symptoms/concerns: Patient presents for follow-up session post-total hysterectomy performed on 11/17/2023. She reports falling at home after surgery. The patient expresses concern about the reduction in Personal Care Services [facility]) hours from 72 to 52 per month, which is now 13 hours a week or slightly over an hour a day. This change is significantly impacting her daily activities, particularly bathing and eating. Due to her hospitalization, the patient missed the appeal deadline for the PCS hour reduction but is currently in contact with the agency to address the situation. She notes that her family is unable to provide additional support due to work commitments. The patient appears worried about managing daily activities with the reduced PCS  hours but demonstrates understanding of the need to address this issue promptly. She also brought up concerns about her relationships and some issues with her apartment, were discussed for remaining of the session.  The treatment plan includes encouraging the patient to continue communication with the [facility] agency regarding the missed appeal deadline and the need for hour reinstatement.   Patient and/or Family's Strengths/Protective Factors: Social connections  Goals Addressed: Patient will:  Reduce symptoms of: depression    Progress towards Goals: Ongoing  Interventions: Interventions utilized:  CBT Cognitive Behavioral Therapy Standardized Assessments completed:    06/26/2023    2:55 PM 06/26/2023    2:45 PM 03/13/2023    5:05 PM  PHQ-SADS Last 3 Score only  Total GAD-7 Score  15   PHQ Adolescent Score 17 17 9      Patient and/or Family Response: Patient agrees to ongoing services  Assessment: Patient currently experiencing Depression.   Patient may benefit from ongoing services.  Plan: Follow up with behavioral health clinician on : 02/25 at 1:30  I discussed the assessment and treatment plan with the patient and/or parent/guardian. They were provided an opportunity to ask questions and all were answered. They agreed with the plan and demonstrated an understanding of the instructions.   They were advised to call back or seek an in-person evaluation if the symptoms worsen or if the condition fails to improve as anticipated.  Christen Butter, MSW, LCSW-A She/Her Behavioral Health Clinician [facility]  [facility] Direct Dial:[phone]  Fax [phone] Main Office Phone: [phone] [address] [place], [place] [address] Website: [facility]  [place], [place]  [place]

## 2023-12-13 ENCOUNTER — Telehealth: Payer: Self-pay | Admitting: Dietician

## 2023-12-25 ENCOUNTER — Emergency Department (HOSPITAL_COMMUNITY): Payer: 59

## 2023-12-25 ENCOUNTER — Emergency Department (HOSPITAL_COMMUNITY)
Admission: EM | Admit: 2023-12-25 | Discharge: 2023-12-26 | Disposition: A | Discharge status: Home / Self Care | Payer: 59 | Attending: Emergency Medicine | Admitting: Emergency Medicine

## 2023-12-25 ENCOUNTER — Encounter (HOSPITAL_COMMUNITY): Payer: Self-pay | Admitting: Emergency Medicine

## 2023-12-25 ENCOUNTER — Encounter (HOSPITAL_COMMUNITY): Payer: Self-pay

## 2023-12-25 DIAGNOSIS — I10 Essential (primary) hypertension: Secondary | ICD-10-CM

## 2023-12-25 DIAGNOSIS — I2510 Atherosclerotic heart disease of native coronary artery without angina pectoris: Secondary | ICD-10-CM | POA: Diagnosis not present

## 2023-12-25 DIAGNOSIS — E1122 Type 2 diabetes mellitus with diabetic chronic kidney disease: Secondary | ICD-10-CM | POA: Insufficient documentation

## 2023-12-25 DIAGNOSIS — Z79899 Other long term (current) drug therapy: Secondary | ICD-10-CM | POA: Insufficient documentation

## 2023-12-25 DIAGNOSIS — R519 Headache, unspecified: Secondary | ICD-10-CM

## 2023-12-25 DIAGNOSIS — I120 Hypertensive chronic kidney disease with stage 5 chronic kidney disease or end stage renal disease: Secondary | ICD-10-CM | POA: Diagnosis not present

## 2023-12-25 DIAGNOSIS — Z7901 Long term (current) use of anticoagulants: Secondary | ICD-10-CM | POA: Diagnosis not present

## 2023-12-25 DIAGNOSIS — Z992 Dependence on renal dialysis: Secondary | ICD-10-CM | POA: Insufficient documentation

## 2023-12-25 DIAGNOSIS — N186 End stage renal disease: Secondary | ICD-10-CM | POA: Diagnosis not present

## 2023-12-25 DIAGNOSIS — Z794 Long term (current) use of insulin: Secondary | ICD-10-CM | POA: Insufficient documentation

## 2023-12-25 LAB — BASIC METABOLIC PANEL
Anion gap: 16 — ABNORMAL HIGH (ref 5–15)
BUN: 19 mg/dL (ref 6–20)
CO2: 25 mmol/L (ref 22–32)
Calcium: 9 mg/dL (ref 8.9–10.3)
Chloride: 93 mmol/L — ABNORMAL LOW (ref 98–111)
Creatinine, Ser: 8.2 mg/dL — ABNORMAL HIGH (ref 0.44–1.00)
GFR, Estimated: 6 mL/min — ABNORMAL LOW (ref >60)
Glucose, Bld: 112 mg/dL — ABNORMAL HIGH (ref 70–99)
Potassium: 4.3 mmol/L (ref 3.5–5.1)
Sodium: 134 mmol/L — ABNORMAL LOW (ref 135–145)

## 2023-12-25 LAB — CBC
HCT: 41.7 % (ref 36.0–46.0)
Hemoglobin: 13.1 g/dL (ref 12.0–15.0)
MCH: 25.5 pg — ABNORMAL LOW (ref 26.0–34.0)
MCHC: 31.4 g/dL (ref 30.0–36.0)
MCV: 81.3 fL (ref 80.0–100.0)
Platelets: 194 10*3/uL (ref 150–400)
RBC: 5.13 MIL/uL — ABNORMAL HIGH (ref 3.87–5.11)
RDW: 18 % — ABNORMAL HIGH (ref 11.5–15.5)
WBC: 5.4 10*3/uL (ref 4.0–10.5)
nRBC: 0 % (ref 0.0–0.2)

## 2023-12-25 MED ORDER — METOCLOPRAMIDE HCL 5 MG/ML IJ SOLN
10.0000 mg | Freq: Once | INTRAMUSCULAR | Status: AC
  Administered 2023-12-25: 10 mg via INTRAVENOUS
  Filled 2023-12-25: qty 2

## 2023-12-25 MED ORDER — AMLODIPINE-OLMESARTAN 5-20 MG PO TABS
1.0000 | ORAL_TABLET | Freq: Every day | ORAL | 0 refills | Status: DC
  Filled 2023-12-25: qty 30, 30d supply, fill #0

## 2023-12-25 MED ORDER — ONDANSETRON HCL 4 MG/2ML IJ SOLN
4.0000 mg | Freq: Once | INTRAMUSCULAR | Status: DC
  Filled 2023-12-25: qty 2

## 2023-12-25 MED ORDER — IRBESARTAN 300 MG PO TABS
150.0000 mg | ORAL_TABLET | Freq: Every day | ORAL | Status: DC
  Administered 2023-12-25: 150 mg via ORAL

## 2023-12-25 MED ORDER — IRBESARTAN 300 MG PO TABS
150.0000 mg | ORAL_TABLET | Freq: Every day | ORAL | Status: DC
  Filled 2023-12-25: qty 1

## 2023-12-25 MED ORDER — AMLODIPINE BESYLATE 5 MG PO TABS
5.0000 mg | ORAL_TABLET | Freq: Once | ORAL | Status: AC
  Administered 2023-12-25: 5 mg via ORAL
  Filled 2023-12-25: qty 1

## 2023-12-25 NOTE — Discharge Instructions (Addendum)
Please follow up closely with your primary care doctor to further discuss your blood pressure medications. We have prescribed you a 30 day course of your blood pressure medicines.  CT today was normal.  If you have severe worsening of your headache, blood pressure, or if you have other emergent concerns, please return to the emergency department.

## 2023-12-25 NOTE — ED Provider Notes (Signed)
[facility] EMERGENCY DEPARTMENT AT [facility] Provider Note   CSN: [id number] Arrival date & time: 12/25/23  1748     History  Chief Complaint  Patient presents with   Headache    Jane [name] is a 48 y.o. female.   Headache Patient has past medical history of diabetes, HLD, HTN, ESRD (HD MWF), CAD s/p PCI LAD, IBS, presenting for headache and high BP. Patient endorses that last week, she missed her dialysis session on Monday and then again on Friday.  She did go on Wednesday and get a full session. She also went to dialysis today and got a full session.  However when they were taking her blood pressure she had systolics greater than 200s, and so they sent her to the emergency department. Patient endorses that she does not take any blood pressure medications for her hypertension and that she had previously been on blood pressure medications, but they have been dropping her blood pressure too low. She also started having a headache while she was at dialysis that has persisted throughout the day.  She did try Tylenol without improvement. She is also started feeling nauseous while here in the emergency department and had an episode of emesis while here.  She otherwise endorses feeling that she was in her normal state of health recently.  Denies fevers, chills, cough, congestion.       Home Medications Prior to Admission medications   Medication Sig Start Date End Date Taking? Authorizing Provider  Accu-Chek Softclix Lancets lancets Use to check blood sugar up to 3 (three) times daily. Patient not taking: Reported on 12/01/2023 05/23/22   Doran Stabler, DO  acetaminophen (TYLENOL) 500 MG tablet Take 2 tablets (1,000 mg total) by mouth every 8 (eight) hours as needed for moderate pain, mild pain, fever or headache. 07/26/23   Hill, Alain Honey, PA-C  albuterol (PROVENTIL HFA) 108 (90 Base) MCG/ACT inhaler Inhale 2 puffs into the lungs every 4 (four) hours as needed for  coughing, wheezing, or shortness of breath. 12/26/22   Lyndle Herrlich, MD  alum & mag hydroxide-simeth (MAALOX/MYLANTA) 200-200-20 MG/5ML suspension Take 15 mLs by mouth every 4 (four) hours as needed for indigestion or heartburn. 11/16/23   Lorriane Shire, MD  amLODipine-olmesartan (AZOR) 5-20 MG tablet Take 1 tablet by mouth every night Patient not taking: Reported on 09/06/2023 10/19/22     apixaban (ELIQUIS) 2.5 MG TABS tablet Take 1 tablet (2.5 mg total) by mouth 2 (two) times daily. Patient not taking: Reported on 09/06/2023 07/26/23   Clois Dupes, PA-C  benzonatate (TESSALON) 100 MG capsule Take 1 capsule (100 mg total) by mouth 3 (three) times daily as needed for cough. 09/13/23   Charlynne Pander, MD  blood glucose meter kit and supplies Dispense based on patient and insurance preference. Use up to four times daily as directed. (FOR ICD-10 E10.9, E11.9). Patient not taking: Reported on 09/06/2023 01/29/21   Evlyn Kanner, MD  blood glucose meter kit and supplies Use in the morning, at noon, and at bedtime. Patient not taking: Reported on 09/06/2023 03/13/23   Lyndle Herrlich, MD  Blood Glucose Monitoring Suppl (ACCU-CHEK GUIDE) w/Device KIT 1 each by Does not apply route 3 (three) times daily. Patient not taking: Reported on 09/06/2023 05/18/17   Nyra Market, MD  cetirizine (ZYRTEC) 10 MG tablet Take 10 mg by mouth daily.    [provider]  Continuous Glucose Receiver (DEXCOM G7 RECEIVER) DEVI Use  to continuously monitor your  blood sugars. Patient not taking: Reported on 12/01/2023 03/02/23   Lyndle Herrlich, MD  Continuous Glucose Sensor (DEXCOM G7 SENSOR) MISC Use it for 10 days to continuously monitor your blood sugars. 06/26/23   Faith Rogue, DO  dicyclomine (BENTYL) 10 MG capsule Take 1 capsule (10 mg total) by mouth 3 (three) times daily before meals. 03/13/23   Lyndle Herrlich, MD  DULoxetine (CYMBALTA) 30 MG capsule Take 1 capsule (30 mg  total) by mouth daily. 06/26/23 06/25/24  Faith Rogue, DO  estradiol (ESTRACE VAGINAL) 0.1 MG/GM vaginal cream Place 1 Applicatorful vaginally nightly for 2 weeks then 2 nights a week afterwards. Patient not taking: Reported on 09/06/2023 06/16/23   Lorriane Shire, MD  gabapentin (NEURONTIN) 100 MG capsule Take 1 capsule (100 mg total) by mouth at bedtime. 03/03/22   Gardenia Phlegm, MD  glucose blood (ACCU-CHEK GUIDE) test strip Use to check blood sugar up to 3 (three) times daily. Patient not taking: Reported on 09/06/2023 05/23/22   Doran Stabler, DO  Glucose Blood (BLOOD GLUCOSE TEST STRIPS) STRP Check blood sugars fasting and then with each meal up to 4 times a day. Patient not taking: Reported on 09/06/2023 03/13/23   Lyndle Herrlich, MD  icosapent Ethyl (VASCEPA) 1 g capsule Take 2 capsules (2 g total) by mouth 2 (two) times daily. 06/14/23   Reather Littler D, NP  injection device for insulin (INPEN 100-PINK-LILLY-HUMALOG) DEVI Use to inject Humalog insulin for meals and correction insulin 03/15/23   Lyndle Herrlich, MD  insulin degludec (TRESIBA) 100 UNIT/ML FlexTouch Pen Inject 24 Units into the skin at bedtime. 03/13/23   Lyndle Herrlich, MD  insulin lispro (HUMALOG) 100 UNIT/ML cartridge Inject 6-20 Units total into the skin 3 (three) times daily with meals. Use with inpen to inject at meal time and for correction insulin up to 40 units daily 11/28/23   Katheran James, DO  Insulin Pen Needle 32G X 4 MM MISC Use to inject insulin 4 (four) times daily. 03/03/22   Gardenia Phlegm, MD  Insulin Syringe-Needle U-100 31G X 15/64" 0.3 ML MISC Use to inject insulin daily 08/17/18   Earl Lagos, MD  Lancet Device MISC Use 1 each in the morning, at noon, and at bedtime. Patient not taking: Reported on 09/06/2023 03/13/23   Lyndle Herrlich, MD  Lancets [facility] DELICA PLUS [place]) MISC 1 each in the morning, at noon, and at bedtime. Patient not taking: Reported on  09/06/2023 03/13/23   Lyndle Herrlich, MD  lanthanum (FOSRENOL) 1000 MG chewable tablet Chew 1 tablet (1,000 mg total) by mouth 3 (three) times daily with meals. 08/11/21     leflunomide (ARAVA) 10 MG tablet Take 1 tablet (10 mg total) by mouth daily. Patient not taking: Reported on 12/01/2023 09/14/23     leflunomide (ARAVA) 10 MG tablet Take 1 tablet (10 mg total) by mouth daily. 09/18/23     methocarbamol (ROBAXIN) 500 MG tablet Take 1 tablet (500 mg total) by mouth every 6 (six) hours as needed for muscle spasms. 08/07/23     metoCLOPramide (REGLAN) 10 MG tablet Take 1 tablet by mouth once a day as needed 10/04/23     metoCLOPramide (REGLAN) 5 MG tablet Take 1 tablet (5 mg total) by mouth every 6 (six) hours as needed. 11/16/23   Lorriane Shire, MD  metoprolol tartrate (LOPRESSOR) 25 MG tablet Take 1 tablet (25 mg total) by mouth 2 (two) times daily. Do not take the night before dialysis, and take 2 hours after dialysis.  Patient not taking: Reported on 09/06/2023 03/16/23   Marykay Lex, MD  Multiple Vitamins-Minerals (HAIR SKIN & NAILS) TABS Take 1 tablet by mouth daily.    [provider]  multivitamin (RENA-VIT) TABS tablet Take 1 tablet by mouth daily. 05/13/22   Gust Rung, DO  omeprazole (PRILOSEC) 20 MG capsule Take 1 capsule (20 mg total) by mouth daily. 11/17/23   Lorriane Shire, MD  oxyCODONE-acetaminophen (PERCOCET) 10-325 MG tablet Take 1 tablet by mouth every 4 (four) hours as needed for pain. Patient not taking: Reported on 10/31/2023 07/26/23   Clois Dupes, PA-C  oxyCODONE-acetaminophen (PERCOCET) 10-325 MG tablet Take 1 tablet by mouth 5 (five) times daily as needed  08/02/23 Patient not taking: Reported on 09/06/2023 08/02/23     oxyCODONE-acetaminophen (PERCOCET) 10-325 MG tablet Take 1 tablet by mouth 5 (five) times daily as needed. Patient not taking: Reported on 09/06/2023 09/02/23     oxyCODONE-acetaminophen (PERCOCET) 10-325 MG tablet Take 1 tablet by  mouth 5 (five) times daily as needed. Patient not taking: Reported on 10/31/2023 10/02/23     oxyCODONE-acetaminophen (PERCOCET) 10-325 MG tablet Take 1 tablet by mouth 5 (five) times daily as needed. 10/30/23     oxyCODONE-acetaminophen (PERCOCET) 10-325 MG tablet Take 1 tablet by mouth 5 (five) times daily as needed. 11/29/23     polyethylene glycol (MIRALAX / GLYCOLAX) 17 g packet Take 17 g by mouth daily as needed for moderate constipation.    [provider]  polyethylene glycol powder (GLYCOLAX/MIRALAX) 17 GM/SCOOP powder Mix as directed and take 1 capful (17 g) by mouth daily as needed for moderate constipation. 11/16/23   Lorriane Shire, MD  prochlorperazine (COMPAZINE) 10 MG tablet Take 1 tablet (10 mg total) by mouth every 6 (six) hours as needed for nausea or vomiting. 11/16/23   Lorriane Shire, MD  promethazine (PHENERGAN) 12.5 MG tablet Take 1 tablet (12.5 mg total) by mouth every 6 (six) hours as needed for nausea or vomiting. 11/13/23   Lorriane Shire, MD  rosuvastatin (CRESTOR) 40 MG tablet Take 1 tablet (40 mg total) by mouth daily. 06/14/23   Reather Littler D, NP  sevelamer carbonate (RENVELA) 800 MG tablet Take 1,600-3,200 mg by mouth See admin instructions. Take 3200 mg with each meal and 1600 mg with each snack    [provider]  Continuous Blood Gluc Transmit (DEXCOM G6 TRANSMITTER) MISC Use to check blood sugar at least 6 times a day 03/03/22 03/13/23  Gardenia Phlegm, MD      Allergies    Adhesive [tape]    Review of Systems   Review of Systems  Neurological:  Positive for headaches.    Physical Exam Updated Vital Signs BP (!) 215/114   Pulse 98   Temp 98.1 F (36.7 C)   Resp 17   LMP  (LMP Unknown)   SpO2 98%  Physical Exam Vitals and nursing note reviewed.  Constitutional:      General: She is not in acute distress.    Appearance: She is well-developed.  HENT:     Head: Normocephalic and atraumatic.  Eyes:     Conjunctiva/sclera:  Conjunctivae normal.  Cardiovascular:     Rate and Rhythm: Normal rate and regular rhythm.     Heart sounds: No murmur heard. Pulmonary:     Effort: Pulmonary effort is normal. No respiratory distress.     Breath sounds: Normal breath sounds.  Abdominal:     Palpations: Abdomen is soft.  Tenderness: There is no abdominal tenderness.  Musculoskeletal:        General: No swelling.     Cervical back: Neck supple.  Skin:    General: Skin is warm and dry.     Capillary Refill: Capillary refill takes less than 2 seconds.  Neurological:     Mental Status: She is alert and oriented to person, place, and time.     GCS: GCS eye subscore is 4. GCS verbal subscore is 5. GCS motor subscore is 6.     Cranial Nerves: No cranial nerve deficit, dysarthria or facial asymmetry.     Sensory: No sensory deficit.     Motor: No weakness.  Psychiatric:        Mood and Affect: Mood normal.     ED Results / Procedures / Treatments   Labs (all labs ordered are listed, but only abnormal results are displayed) Labs Reviewed  BASIC METABOLIC PANEL - Abnormal; Notable for the following components:      Result Value   Sodium 134 (*)    Chloride 93 (*)    Glucose, Bld 112 (*)    Creatinine, Ser 8.20 (*)    GFR, Estimated 6 (*)    Anion gap 16 (*)    All other components within normal limits  CBC - Abnormal; Notable for the following components:   RBC 5.13 (*)    MCH 25.5 (*)    RDW 18.0 (*)    All other components within normal limits    EKG None  Radiology No results found.  Procedures Procedures    Medications Ordered in ED Medications - No data to display  ED Course/ Medical Decision Making/ A&P                                 Medical Decision Making Amount and/or Complexity of Data Reviewed Labs: ordered. Radiology: ordered.  Risk Prescription drug management.   48 year old patient with history of hypertension and ESRD presenting for hyper pressure and headache from  dialysis today. Patient did not receive a full dialysis session today. He is to take the pressure medications, however stopped taking them due to low blood pressures.  She endorses a gradual onset headache today while at hemodialysis that was unresponsive to Tylenol.  On exam, patient has no focal neurologic deficits.  Her lab workup is reassuring with BMP electrolyte abnormality.  Her creatinine is 8.2, which is within the realm of normal for her in the setting of ESRD.  CBC is also reassuring.  Per chart review, patient is prescribed antihypertensive medication.  Will administer her home medications here with Norvasc and Avapro.  Also obtain CT head to assess for ICH in the setting of her marked hypertension.  Patient was handed off to oncoming provider at 11:30PM, please see their note for further details.  Plan at time of handoff: Follow-up CT head imaging If reassuring and if patient blood pressure shows some improvement, likely okay to discharge with close PCP follow-up        Final Clinical Impression(s) / ED Diagnoses Final diagnoses:  None    Rx / DC Orders ED Discharge Orders     None         Caron Presume, MD 12/25/23 2345    Rondel Baton, MD 12/29/23 [phone]

## 2023-12-25 NOTE — ED Triage Notes (Addendum)
 Pt BIB [facility] from dialysis center with c/o headache and hypertension. Pt reports her nephrologist stopped her BP medication in September. Pt was given 0.1 Clonidine at dialysis. Pt denies any changes in vision. Pt also reports n/v.

## 2023-12-26 ENCOUNTER — Other Ambulatory Visit: Payer: Self-pay

## 2023-12-26 ENCOUNTER — Other Ambulatory Visit (HOSPITAL_COMMUNITY): Payer: Self-pay

## 2023-12-26 ENCOUNTER — Encounter (HOSPITAL_COMMUNITY): Payer: Self-pay

## 2023-12-26 NOTE — ED Notes (Signed)
Discharge instructions reviewed with patient. Patient questions answered and opportunity for education reviewed. Patient voices understanding of discharge instructions with no further questions. Patient ambulatory with steady gait to lobby.

## 2023-12-26 NOTE — ED Provider Notes (Signed)
  1:14 AM BP down trended nicely after home meds, most recent check 161/91.  CT head negative.  Stable for discharge.  Rx for BP meds given, encouraged close PCP follow-up.  Return here for new concerns.   Garlon Hatchet, PA-C 12/26/23 0246    Shon Baton, MD 12/26/23 [phone]

## 2023-12-27 NOTE — Telephone Encounter (Signed)
Offered assistance with her inPen. She states she changed her diet and that has helped(A1c 6.9%). Informed her that inpen setting may need adjustment if weight /diet have changed. She also stated she did not feel like talking and was not ready to make an appointment with any of our providers.

## 2023-12-29 ENCOUNTER — Other Ambulatory Visit (HOSPITAL_COMMUNITY): Payer: Self-pay

## 2023-12-29 MED ORDER — OXYCODONE-ACETAMINOPHEN 10-325 MG PO TABS
1.0000 | ORAL_TABLET | Freq: Every day | ORAL | 0 refills | Status: DC | PRN
  Filled 2023-12-29: qty 150, 30d supply, fill #0

## 2024-01-01 ENCOUNTER — Other Ambulatory Visit (HOSPITAL_COMMUNITY): Payer: Self-pay

## 2024-01-01 ENCOUNTER — Encounter: Payer: Self-pay | Admitting: Student

## 2024-01-01 ENCOUNTER — Encounter (HOSPITAL_COMMUNITY): Payer: Self-pay

## 2024-01-01 ENCOUNTER — Ambulatory Visit: Payer: 59 | Admitting: Student

## 2024-01-01 DIAGNOSIS — N186 End stage renal disease: Secondary | ICD-10-CM

## 2024-01-01 DIAGNOSIS — J069 Acute upper respiratory infection, unspecified: Secondary | ICD-10-CM | POA: Diagnosis not present

## 2024-01-01 DIAGNOSIS — Z992 Dependence on renal dialysis: Secondary | ICD-10-CM

## 2024-01-01 DIAGNOSIS — Z1231 Encounter for screening mammogram for malignant neoplasm of breast: Secondary | ICD-10-CM

## 2024-01-01 MED ORDER — GUAIFENESIN ER 600 MG PO TB12
600.0000 mg | ORAL_TABLET | Freq: Two times a day (BID) | ORAL | 0 refills | Status: AC | PRN
  Filled 2024-01-01: qty 40, 20d supply, fill #0

## 2024-01-01 MED ORDER — BENZONATATE 100 MG PO CAPS
100.0000 mg | ORAL_CAPSULE | Freq: Three times a day (TID) | ORAL | 0 refills | Status: DC | PRN
  Filled 2024-01-01: qty 21, 7d supply, fill #0

## 2024-01-01 NOTE — Assessment & Plan Note (Addendum)
She reports 2 days of nasal congestion and cough with yellowish sputum production.  She states that she has felt feverish but has not taken her temperature.  She had some nausea last Friday, but otherwise has not had any nausea or vomiting recently.  No other GI symptoms.  No acute shortness of breath or wheezing.  She does have a history of asthma and has been using her inhalers regularly.  I suspect this most likely a viral upper respiratory illness and will improve with symptomatic management.  I recommended she take Tylenol as needed, and I have sent in a prescription of Tessalon Perles and Mucinex to her pharmacy.  If her symptoms do not improve in the next week or so, I recommended she schedule an in person visit at our clinic.

## 2024-01-01 NOTE — Assessment & Plan Note (Deleted)
She reports cough and nasal congestion for a few days. She has not taken her temperature. She has a cough with congestion.

## 2024-01-01 NOTE — Progress Notes (Signed)
CC: nasal congestion, cough  This is a telephone encounter between [name] and Annett Fabian on 01/01/2024 for nasal congestion, cough. The visit was conducted with the patient located at  the hemodialysis center  and Annett Fabian at [facility]. The patient's identity was confirmed using their DOB and current address. The patient has consented to being evaluated through a telephone encounter and understands the associated risks (an examination cannot be done and the patient may need to come in for an appointment) / benefits (allows the patient to remain at home, decreasing exposure to coronavirus). I personally spent 15 minutes on medical discussion.   HPI:  [name] is a 48 y.o. with PMH as below.   Please see A&P for assessment of the patient's acute and chronic medical conditions.   Past Medical History:  Diagnosis Date   Abnormal uterine bleeding (AUB)    Anxiety    Arthritis of knee    Left, Gel and cortisone injections @ [facility]   Asthma    prn inhaler   Bipolar disorder (HCC)    CAD S/P BMS PCI to prox LAD cardiologist--- dr Judithann Sauger LAD to Mid LAD lesion, 90% stenosed. Post intervention - Vision BMS 3.0 mm x 18 mm (~3.5 mm) there is a 0% residual stenosis. 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   left 2018; right 2019   Depression    Diabetic foot ulcer (HCC) 04/22/2019   Diabetic neuropathy (HCC)    feet   Diabetic retinopathy, nonproliferative, severe (HCC)    bilateral   Dyspnea    with too much fluid   ESRD (end stage renal disease) on dialysis [facility])    ?chronic interstitial nephritis  ([facility]   H/O non-ST elevation myocardial infarction (NSTEMI) 03/2016   Found in 90% mid LAD lesion treated with bare-metal stent (BMS) PCI - vision BMS 3.0 mm x 18 mm   Heart murmur    History of blood transfusion    History of MRSA  infection 2007   Hyperlipidemia    Hypertension    IDA (iron deficiency anemia)    Insulin dependent type 2 diabetes mellitus (HCC)    Iron deficiency anemia    takes iron supplement   Myocardial infarction [facility])    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   PAOD (peripheral arterial occlusive disease) (HCC)    vascular--- dr Imogene Burn   Pneumonia 11/08/2021   PONV (postoperative nausea and vomiting)    S/P arteriovenous (AV) fistula creation 07/2017   S/p bare metal coronary artery stent 03/31/2016   BMS x1  to pLAD   Sickle cell trait (HCC)    Ulcer of left foot due to type 2 diabetes mellitus (HCC) 06/03/2019   Review of Systems: Positive for cough, congestion; negative for diarrhea, abdominal pain, fever, chills, shortness of breath, wheezing  Assessment & Plan:   Viral upper respiratory illness She reports 2 days of nasal congestion and cough with yellowish sputum production.  She states that she has felt feverish but has not taken her temperature.  She had some nausea last Friday, but otherwise has not had any nausea or vomiting recently.  No other GI symptoms.  No acute shortness of breath or wheezing.  She does have a history of asthma and has been using her inhalers regularly.  I suspect this  most likely a viral upper respiratory illness and will improve with symptomatic management.  I recommended she take Tylenol as needed, and I have sent in a prescription of Tessalon Perles and Mucinex to her pharmacy.  If her symptoms do not improve in the next week or so, I recommended she schedule an in person visit at our clinic.   Patient discussed with Dr. Jerelene Redden, MD Internal Medicine Resident

## 2024-01-05 ENCOUNTER — Other Ambulatory Visit (HOSPITAL_COMMUNITY): Payer: Self-pay

## 2024-01-05 ENCOUNTER — Encounter (HOSPITAL_COMMUNITY): Payer: Self-pay

## 2024-01-09 ENCOUNTER — Ambulatory Visit (INDEPENDENT_AMBULATORY_CARE_PROVIDER_SITE_OTHER): Payer: 59 | Admitting: Licensed Clinical Social Worker

## 2024-01-09 DIAGNOSIS — F321 Major depressive disorder, single episode, moderate: Secondary | ICD-10-CM | POA: Diagnosis not present

## 2024-01-09 NOTE — BH Specialist Note (Signed)
 Integrated Behavioral Health via Telemedicine Visit  01/09/2024 Jane [name] [id number]  Number of Integrated Behavioral Health Clinician visits: Additional Visit  Session Start time: 1330   Session End time: 1430  Total time in minutes: 60   Referring Provider: PCP Patient/Family location: Home [name] Provider location: Office All persons participating in visit: [name] and Patient Types of Service: Individual psychotherapy and Telephone visit  I connected with Jane [name] via  Telephone  and verified that I am speaking with the correct person using two identifiers. Discussed confidentiality: Yes   I discussed the limitations of telemedicine and the availability of in person appointments.  Discussed there is a possibility of technology failure and discussed alternative modes of communication if that failure occurs.  I discussed that engaging in this telemedicine visit, they consent to the provision of behavioral healthcare and the services will be billed under their insurance.  Patient and/or legal guardian expressed understanding and consented to Telemedicine visit: Yes   Presenting Concerns: Patient and/or family reports the following symptoms/concerns: [name] spoke with the patient today. The patient reported doing well overall and spent a significant portion of the session discussing their excitement about moving into a new house. The patient described the new residence as having three bedrooms and two bathrooms, with more yard space for their grandchildren to play. This move is expected to take place in March 2025.  The patient also mentioned their upcoming birthday and shared that they have already started celebrating. During the session, the conversation touched on some current challenges, particularly regarding transportation. The patient discussed having only one car and the need for repairs on that vehicle, which is causing some difficulties.  In terms of  healthcare, the patient expressed their intention to make an appointment with their Jane provider. They also recognized the need to speak with Lupita Leash regarding their A1c levels and not taking their prescribed medication. The [name] encouraged the patient to follow through with these important health-related tasks.  Overall, the patient appeared engaged and positive during the session, demonstrating good insight into their current life changes and challenges. The Jane [name] plans to continue monitoring the patient's adjustment to the upcoming move and will follow up on the transportation issues and car repairs in the next session.  Patient and/or Family's Strengths/Protective Factors: Sense of purpose  Goals Addressed: Patient will:  Reduce symptoms of: depression    Progress towards Goals: Ongoing  Interventions: Interventions utilized:  CBT Cognitive Behavioral Therapy Standardized Assessments completed: Patient declined screening    Assessment: Patient currently experiencing Depression.   Patient may benefit from Ongoing therapy.  Plan: Follow up with behavioral health clinician on : 03/25 at 1:30pm Telehealth   I discussed the assessment and treatment plan with the patient and/or parent/guardian. They were provided an opportunity to ask questions and all were answered. They agreed with the plan and demonstrated an understanding of the instructions.   They were advised to call back or seek an in-person evaluation if the symptoms worsen or if the condition fails to improve as anticipated.  Jane [name], MSW, LCSW-A She/Her Behavioral Health Clinician [name] Direct Dial:[phone]  Fax [phone] Main Office Phone: [phone] [address] [place] [place], [place] [address] Website: [name] [place]  [place], [place]  [place]

## 2024-01-10 NOTE — Progress Notes (Signed)
 Internal Medicine Clinic Attending  Case discussed with the resident at the time of the visit.  We reviewed the resident's history and exam and pertinent patient test results.  I agree with the assessment, diagnosis, and plan of care documented in the resident's note.

## 2024-01-12 ENCOUNTER — Other Ambulatory Visit (HOSPITAL_COMMUNITY): Payer: Self-pay

## 2024-01-12 ENCOUNTER — Ambulatory Visit: Payer: 59 | Admitting: Obstetrics and Gynecology

## 2024-01-12 ENCOUNTER — Other Ambulatory Visit (HOSPITAL_COMMUNITY)
Admission: RE | Admit: 2024-01-12 | Discharge: 2024-01-12 | Disposition: A | Discharge status: Home / Self Care | Payer: 59 | Source: Ambulatory Visit | Attending: Obstetrics and Gynecology | Admitting: Obstetrics and Gynecology

## 2024-01-12 VITALS — BP 157/84 | HR 81 | Wt 204.0 lb

## 2024-01-12 DIAGNOSIS — N760 Acute vaginitis: Secondary | ICD-10-CM

## 2024-01-12 DIAGNOSIS — Z09 Encounter for follow-up examination after completed treatment for conditions other than malignant neoplasm: Secondary | ICD-10-CM

## 2024-01-12 MED ORDER — FLUCONAZOLE 150 MG PO TABS
150.0000 mg | ORAL_TABLET | Freq: Once | ORAL | 0 refills | Status: AC
  Filled 2024-01-12 – 2024-01-29 (×2): qty 1, 1d supply, fill #0

## 2024-01-12 NOTE — Progress Notes (Signed)
   POSTOPERATIVE VISIT NOTE   Subjective:     Jane [name] is a 48 y.o. G2P1102 who presents to the clinic 8 weeks status post  TLH, BS, cysto  for abnormal uterine bleeding. Eating a regular diet without difficulty. Bowel movements are normal. The patient is not having any pain. Incision: doing well  Vaginal bleeding: none Resumed sexual acitivity: no   The following portions of the patient's history were reviewed and updated as appropriate: allergies, current medications, past family history, past medical history, past social history, past surgical history, and problem list..   Review of Systems Pertinent items are noted in HPI.    Objective:    BP (!) 157/84 (BP Location: Left Arm, Patient Position: Sitting, Cuff Size: Large)   Pulse 81   Wt 204 lb (92.5 kg)   LMP  (LMP Unknown)   BMI 33.95 kg/m  General:  alert, cooperative, and no distress  Abdomen: soft, non-tender  Incision:   healing well, no drainage, no erythema, no hernia, no seroma, no swelling, no dehiscence, incision well approximated  Pelvic:    Vaginal cuff intact on visual and digital palpation  small abrasion on left vulva, small volume discharge      Assessment:   Doing well postoperatively. Operative findings again reviewed. Pathology report discussed.   Plan:    1. Postop check (Primary) Doing well Meeting postop goals Cleared for all activities  2. Acute vaginitis Presumed candida, vaginitis swab collected. Fluconazole sent and recommend aquaphor application to area of broken skin - fluconazole (DIFLUCAN) 150 MG tablet; Take 1 tablet (150 mg total) by mouth once for 1 dose.  Dispense: 1 tablet; Refill: 0 - Cervicovaginal ancillary only   Activity restrictions: none Anticipated return to work: not applicable. Follow up: as needed  Lorriane Shire, MD Obstetrician & Gynecologist, [facility] for [employer], [facility]

## 2024-01-14 LAB — CERVICOVAGINAL ANCILLARY ONLY
Bacterial Vaginitis (gardnerella): NEGATIVE
Candida Glabrata: NEGATIVE
Candida Vaginitis: NEGATIVE
Comment: NEGATIVE
Comment: NEGATIVE
Comment: NEGATIVE

## 2024-01-15 ENCOUNTER — Encounter: Payer: Self-pay | Admitting: Obstetrics and Gynecology

## 2024-01-24 ENCOUNTER — Other Ambulatory Visit (HOSPITAL_COMMUNITY): Payer: Self-pay

## 2024-01-27 ENCOUNTER — Other Ambulatory Visit (HOSPITAL_COMMUNITY): Payer: Self-pay

## 2024-01-29 ENCOUNTER — Other Ambulatory Visit (HOSPITAL_COMMUNITY): Payer: Self-pay

## 2024-01-29 ENCOUNTER — Other Ambulatory Visit: Payer: Self-pay

## 2024-01-29 MED ORDER — NALOXONE HCL 4 MG/0.1ML NA LIQD
1.0000 | NASAL | 12 refills | Status: AC | PRN
Start: 2024-01-26 — End: ?

## 2024-01-29 MED ORDER — OXYCODONE-ACETAMINOPHEN 10-325 MG PO TABS
1.0000 | ORAL_TABLET | Freq: Every day | ORAL | 0 refills | Status: DC | PRN
  Filled 2024-01-29: qty 150, 30d supply, fill #0

## 2024-02-06 ENCOUNTER — Ambulatory Visit (INDEPENDENT_AMBULATORY_CARE_PROVIDER_SITE_OTHER): Payer: 59 | Admitting: Licensed Clinical Social Worker

## 2024-02-06 DIAGNOSIS — F321 Major depressive disorder, single episode, moderate: Secondary | ICD-10-CM | POA: Diagnosis not present

## 2024-02-06 NOTE — Patient Instructions (Signed)
 Visit Information  Thank you for taking time to visit with me today. Please don't hesitate to contact me if I can be of assistance to you.   Recommendations for Future Care:  Continue practicing mindfulness for stress management.  Engage in regular communication with her daughter using active listening skills.  Maintain connections with her support network for ongoing support.  Next Steps:  Follow-up care is recommended to monitor progress and provide additional support as needed. The patient is encouraged to continue leveraging her support system and practicing coping skills to manage ongoing challenges.   Our next appointment is by telephone on April 22 at 1:30 PM via telehealth/ Telephone  If you are experiencing a Mental Health or Behavioral Health Crisis or need someone to talk to, please call the Suicide and Crisis Lifeline: 988 go to [facility] [address], [place] [phone]) call 911   Patient verbalizes understanding of instructions and care plan provided today and agrees to view in MyChart. Active MyChart status and patient understanding of how to access instructions and care plan via MyChart confirmed with patient.     Christen Butter, MSW, LCSW-A She/Her Behavioral Health Clinician [facility]  Internal Medicine Center Direct Dial:[phone]  Fax [phone] Main Office Phone: [phone] [address] [place] [place], [place] [address] Website: [facility] Internal Medicine [facility]  [place], [place]  [place]

## 2024-02-06 NOTE — BH Specialist Note (Signed)
 Integrated Behavioral Health via Telemedicine Visit  02/06/2024 KATARINA RIEBE [id number]  Number of Integrated Behavioral Health Clinician visits: Additional Visit  Session Start time: 1430   Session End time: 1530  Total time in minutes: 60   Referring Provider: PCP Patient/Family location: Home [facility] Provider location: Office All persons participating in visit: [facility] and Patient Types of Service: Individual psychotherapy and Telephone visit  I connected with [name] via  Telephone  and verified that I am speaking with the correct person using two identifiers. Discussed confidentiality: Yes   I discussed the limitations of telemedicine and the availability of in person appointments.  Discussed there is a possibility of technology failure and discussed alternative modes of communication if that failure occurs.  I discussed that engaging in this telemedicine visit, they consent to the provision of behavioral healthcare and the services will be billed under their insurance.  Patient and/or legal guardian expressed understanding and consented to Telemedicine visit: Yes   Presenting Concerns: Patient and/or family reports the following symptoms/concerns: The [facility] conducted a session with the patient today, beginning with a check-in to assess the patient's current state. Patient reported an increase in her PCS hours.  The patient reported that she is doing okay and managing to get through her challenges. In response to the [name] inquiries about any significant events since their last conversation, the patient shared that she was unexpectedly evicted on March 10th. She discovered the eviction upon arriving home to find a padlock on her unit, as she had not been informed beforehand. Despite this setback, she has been approved for a new residence. The patient noted that the situation led to financial obstacles, but she was able to navigate them effectively by leveraging her  support system.  The session also delved into ongoing conflicts with her daughter, particularly regarding financial responsibilities. The patient expressed frustration and disappointment because her daughter has not been fulfilling her agreed-upon commitments to assist with expenses. This has resulted in a buildup of tension and conflict between them. Despite these challenges, the patient reported feeling better overall and continuing on her path of recovery.  To manage frustration, the [facility] and patient discussed the coping skill of mindfulness meditation. This involves focusing on the present moment and using breathing techniques to calm the mind and body, helping to reduce stress and prevent impulsive reactions. Additionally, they explored a de-escalation technique: active listening and empathy. By actively listening to her daughter's perspective and acknowledging her feelings without judgment, the patient can help de-escalate conflicts and foster a more constructive dialogue. This approach involves maintaining a calm demeanor, using non-threatening body language, and focusing on finding common ground to resolve issues collaboratively.   Patient and/or Family's Strengths/Protective Factors: Sense of purpose  Goals Addressed: Patient will:  Reduce symptoms of: stress and family conflict   Progress towards Goals: Ongoing  Interventions: Interventions utilized:  Mindfulness or Management consultant and CBT Cognitive Behavioral Therapy Standardized Assessments completed: Patient declined screening and PHQ-SADS    Patient and/or Family Response: Patient reports OPT is beneficial.  Assessment: Patient currently experiencing Relationship Conflict.   Patient may benefit from Ongoing OPT.  Plan: Follow up with behavioral health clinician on : 03/05/2024; Telehealth; at 1:30 pm.  I discussed the assessment and treatment plan with the patient and/or parent/guardian. They were provided an  opportunity to ask questions and all were answered. They agreed with the plan and demonstrated an understanding of the instructions.   They were advised to call back  or seek an in-person evaluation if the symptoms worsen or if the condition fails to improve as anticipated.  Christen Butter, MSW, LCSW-A She/Her Behavioral Health Clinician [facility]  Internal Medicine Center Direct Dial:[phone]  Fax [phone] Main Office Phone: [phone] [address] [place] [place], [place] [address] Website: [facility] Internal Medicine [employer]  [place], [place]  [place]

## 2024-02-06 NOTE — Addendum Note (Signed)
 Addended by: Katheran James on: 02/06/2024 10:39 AM   Modules accepted: Orders

## 2024-02-13 ENCOUNTER — Telehealth: Payer: Self-pay | Admitting: *Deleted

## 2024-02-13 NOTE — Telephone Encounter (Signed)
 Mammogram appointment 02-15-2024.  Breast center.-[phone].

## 2024-02-15 ENCOUNTER — Ambulatory Visit
Admission: RE | Admit: 2024-02-15 | Discharge: 2024-02-15 | Disposition: A | Discharge status: Home / Self Care | Source: Ambulatory Visit | Attending: Internal Medicine | Admitting: Internal Medicine

## 2024-02-15 DIAGNOSIS — Z1231 Encounter for screening mammogram for malignant neoplasm of breast: Secondary | ICD-10-CM

## 2024-02-23 ENCOUNTER — Other Ambulatory Visit: Payer: Self-pay | Admitting: Internal Medicine

## 2024-02-23 DIAGNOSIS — R928 Other abnormal and inconclusive findings on diagnostic imaging of breast: Secondary | ICD-10-CM

## 2024-02-26 ENCOUNTER — Other Ambulatory Visit (HOSPITAL_COMMUNITY): Payer: Self-pay

## 2024-02-27 ENCOUNTER — Other Ambulatory Visit (HOSPITAL_COMMUNITY): Payer: Self-pay

## 2024-02-27 MED ORDER — OXYCODONE-ACETAMINOPHEN 10-325 MG PO TABS
1.0000 | ORAL_TABLET | Freq: Every day | ORAL | 0 refills | Status: DC
  Filled 2024-02-27 – 2024-02-28 (×2): qty 150, 30d supply, fill #0

## 2024-02-28 ENCOUNTER — Other Ambulatory Visit (HOSPITAL_COMMUNITY): Payer: Self-pay

## 2024-03-04 ENCOUNTER — Encounter (HOSPITAL_COMMUNITY): Payer: Self-pay | Admitting: Emergency Medicine

## 2024-03-04 ENCOUNTER — Ambulatory Visit (HOSPITAL_COMMUNITY): Admission: EM | Admit: 2024-03-04 | Discharge: 2024-03-04 | Disposition: A | Discharge status: Home / Self Care

## 2024-03-04 DIAGNOSIS — Z202 Contact with and (suspected) exposure to infections with a predominantly sexual mode of transmission: Secondary | ICD-10-CM

## 2024-03-04 LAB — HIV ANTIBODY (ROUTINE TESTING W REFLEX): HIV Screen 4th Generation wRfx: NONREACTIVE

## 2024-03-04 NOTE — ED Triage Notes (Signed)
 Pt requesting STD testing. Reports having vaginal itching and discharge that is unknown how long. Had unprotected sex with her partner after finding out he has another partner.

## 2024-03-04 NOTE — Discharge Instructions (Signed)
 1. Possible exposure to STD (Primary) - Cervicovaginal collected in UC and sent to lab for further testing results should be available in 1 to 2 days - HIV Antibody (routine testing w rflx) collected and sent to lab for further testing results should be available in 1 to 2 days - RPRcollected and sent to lab for further testing results should be available in 1 to 2 days -Continue to monitor symptoms for any change in severity if there is any escalation of current symptoms or development of new symptoms follow-up in ER for further evaluation and management.

## 2024-03-04 NOTE — ED Provider Notes (Signed)
 [facility]   Note:  This document was prepared using Dragon voice recognition software and may include unintentional dictation errors.  MRN: [id number] DOB: 11-25-1975  Subjective:   Jane [name] is a 48 y.o. female presenting for STD testing after finding out that her boyfriend for 15 years has another sexual partner.  Patient reports that she has mild vaginal itching and irritation, no vaginal discharge, no dysuria, no vaginal lesions, no abdominal pain or flank pain.  Patient would like general screening for all STDs.  No other secondary symptoms or medical concerns.   Current Facility-Administered Medications:    polyethylene glycol powder (GLYCOLAX /MIRALAX ) container 255 g, 1 Container, Oral, Once, Aram Knights, MD  Current Outpatient Medications:    Accu-Chek Softclix Lancets lancets, Use to check blood sugar up to 3 (three) times daily. (Patient not taking: Reported on 12/01/2023), Disp: 300 each, Rfl: 3   acetaminophen  (TYLENOL ) 500 MG tablet, Take 2 tablets (1,000 mg total) by mouth every 8 (eight) hours as needed for moderate pain, mild pain, fever or headache., Disp: 30 tablet, Rfl: 0   albuterol  (PROVENTIL  HFA) 108 (90 Base) MCG/ACT inhaler, Inhale 2 puffs into the lungs every 4 (four) hours as needed for coughing, wheezing, or shortness of breath., Disp: 20.1 g, Rfl: 2   alum & mag hydroxide-simeth (MAALOX/MYLANTA) 200-200-20 MG/5ML suspension, Take 15 mLs by mouth every 4 (four) hours as needed for indigestion or heartburn., Disp: 355 mL, Rfl: 0   amLODipine -olmesartan  (AZOR ) 5-20 MG tablet, Take 1 tablet by mouth every night (Patient not taking: Reported on 01/12/2024), Disp: 30 tablet, Rfl: 0   apixaban  (ELIQUIS ) 2.5 MG TABS tablet, Take 1 tablet (2.5 mg total) by mouth 2 (two) times daily. (Patient not taking: Reported on 09/06/2023), Disp: 60 tablet, Rfl: 0   benzonatate  (TESSALON ) 100 MG capsule, Take 1 capsule (100 mg total) by mouth 3 (three) times  daily as needed for cough., Disp: 21 capsule, Rfl: 0   blood glucose meter kit and supplies, Dispense based on patient and insurance preference. Use up to four times daily as directed. (FOR ICD-10 E10.9, E11.9). (Patient not taking: Reported on 09/06/2023), Disp: 1 each, Rfl: 0   blood glucose meter kit and supplies, Use in the morning, at noon, and at bedtime. (Patient not taking: Reported on 09/06/2023), Disp: 1 each, Rfl: 0   Blood Glucose Monitoring Suppl (ACCU-CHEK GUIDE) w/Device KIT, 1 each by Does not apply route 3 (three) times daily. (Patient not taking: Reported on 09/06/2023), Disp: 1 kit, Rfl: 0   cetirizine  (ZYRTEC ) 10 MG tablet, Take 10 mg by mouth daily., Disp: , Rfl:    Continuous Glucose Receiver (DEXCOM G7 RECEIVER) DEVI, Use  to continuously monitor your blood sugars. (Patient taking differently: Use  to continuously monitor your blood sugars.), Disp: 1 each, Rfl: 0   Continuous Glucose Sensor (DEXCOM G7 SENSOR) MISC, Use it for 10 days to continuously monitor your blood sugars. (Patient not taking: Reported on 01/12/2024), Disp: 9 each, Rfl: 3   dicyclomine  (BENTYL ) 10 MG capsule, Take 1 capsule (10 mg total) by mouth 3 (three) times daily before meals., Disp: 90 capsule, Rfl: 0   DULoxetine  (CYMBALTA ) 30 MG capsule, Take 1 capsule (30 mg total) by mouth daily., Disp: 30 capsule, Rfl: 1   estradiol  (ESTRACE  VAGINAL) 0.1 MG/GM vaginal cream, Place 1 Applicatorful vaginally nightly for 2 weeks then 2 nights a week afterwards. (Patient not taking: Reported on 09/06/2023), Disp: 42.5 g, Rfl: 4   gabapentin  (NEURONTIN ) 100  MG capsule, Take 1 capsule (100 mg total) by mouth at bedtime., Disp: 90 capsule, Rfl: 1   glucose blood (ACCU-CHEK GUIDE) test strip, Use to check blood sugar up to 3 (three) times daily. (Patient not taking: Reported on 09/06/2023), Disp: 300 each, Rfl: 3   Glucose Blood (BLOOD GLUCOSE TEST STRIPS) STRP, Check blood sugars fasting and then with each meal up to 4 times  a day. (Patient not taking: Reported on 09/06/2023), Disp: 100 strip, Rfl: 0   icosapent  Ethyl (VASCEPA ) 1 g capsule, Take 2 capsules (2 g total) by mouth 2 (two) times daily., Disp: 180 capsule, Rfl: 1   injection device for insulin  (INPEN 100-PINK-LILLY-HUMALOG ) DEVI, Use to inject Humalog  insulin  for meals and correction insulin , Disp: 1 each, Rfl: 1   insulin  degludec (TRESIBA ) 100 UNIT/ML FlexTouch Pen, Inject 24 Units into the skin at bedtime., Disp: 15 mL, Rfl: 1   insulin  lispro (HUMALOG ) 100 UNIT/ML cartridge, Inject 6-20 Units total into the skin 3 (three) times daily with meals. Use with inpen to inject at meal time and for correction insulin  up to 40 units daily, Disp: 36 mL, Rfl: 3   Insulin  Pen Needle 32G X 4 MM MISC, Use to inject insulin  4 (four) times daily., Disp: 400 each, Rfl: 3   Insulin  Syringe-Needle U-100 31G X 15/64" 0.3 ML MISC, Use to inject insulin  daily, Disp: 100 each, Rfl: 3   Lancet Device MISC, Use 1 each in the morning, at noon, and at bedtime. (Patient not taking: Reported on 09/06/2023), Disp: 1 each, Rfl: 0   Lancets (ONETOUCH DELICA PLUS LANCET33G) MISC, 1 each in the morning, at noon, and at bedtime. (Patient not taking: Reported on 09/06/2023), Disp: 100 each, Rfl: 0   lanthanum  (FOSRENOL ) 1000 MG chewable tablet, Chew 1 tablet (1,000 mg total) by mouth 3 (three) times daily with meals., Disp: 270 tablet, Rfl: 4   leflunomide  (ARAVA ) 10 MG tablet, Take 1 tablet (10 mg total) by mouth daily. (Patient not taking: Reported on 12/01/2023), Disp: 30 tablet, Rfl: 2   leflunomide  (ARAVA ) 10 MG tablet, Take 1 tablet (10 mg total) by mouth daily., Disp: 30 tablet, Rfl: 2   methocarbamol  (ROBAXIN ) 500 MG tablet, Take 1 tablet (500 mg total) by mouth every 6 (six) hours as needed for muscle spasms., Disp: 20 tablet, Rfl: 0   metoCLOPramide  (REGLAN ) 10 MG tablet, Take 1 tablet by mouth once a day as needed, Disp: 30 tablet, Rfl: 0   metoCLOPramide  (REGLAN ) 5 MG tablet, Take 1  tablet (5 mg total) by mouth every 6 (six) hours as needed., Disp: 120 tablet, Rfl: 0   metoprolol  tartrate (LOPRESSOR ) 25 MG tablet, Take 1 tablet (25 mg total) by mouth 2 (two) times daily. Do not take the night before dialysis, and take 2 hours after dialysis. (Patient not taking: Reported on 09/06/2023), Disp: 180 tablet, Rfl: 0   Multiple Vitamins-Minerals (HAIR SKIN & NAILS) TABS, Take 1 tablet by mouth daily., Disp: , Rfl:    multivitamin (RENA-VIT) TABS tablet, Take 1 tablet by mouth daily., Disp: 90 tablet, Rfl: 3   naloxone  (NARCAN ) nasal spray 4 mg/0.1 mL, Place 1 spray into the nose as needed if found unresponsive and call 911 immediately., Disp: 2 each, Rfl: 12   omeprazole  (PRILOSEC) 20 MG capsule, Take 1 capsule (20 mg total) by mouth daily., Disp: 30 capsule, Rfl: 1   oxyCODONE -acetaminophen  (PERCOCET) 10-325 MG tablet, Take 1 tablet by mouth every 4 (four) hours as needed for pain. (  Patient not taking: Reported on 10/31/2023), Disp: 42 tablet, Rfl: 0   oxyCODONE -acetaminophen  (PERCOCET) 10-325 MG tablet, Take 1 tablet by mouth 5 (five) times daily as needed  08/02/23 (Patient not taking: Reported on 09/06/2023), Disp: 150 tablet, Rfl: 0   oxyCODONE -acetaminophen  (PERCOCET) 10-325 MG tablet, Take 1 tablet by mouth 5 (five) times daily as needed. (Patient not taking: Reported on 09/06/2023), Disp: 150 tablet, Rfl: 0   oxyCODONE -acetaminophen  (PERCOCET) 10-325 MG tablet, Take 1 tablet by mouth 5 (five) times daily as needed. (Patient not taking: Reported on 10/31/2023), Disp: 150 tablet, Rfl: 0   oxyCODONE -acetaminophen  (PERCOCET) 10-325 MG tablet, Take 1 tablet by mouth 5 (five) times daily as needed., Disp: 150 tablet, Rfl: 0   oxyCODONE -acetaminophen  (PERCOCET) 10-325 MG tablet, Take 1 tablet by mouth 5 (five) times daily as needed. (Patient not taking: Reported on 01/12/2024), Disp: 150 tablet, Rfl: 0   oxyCODONE -acetaminophen  (PERCOCET) 10-325 MG tablet, Take 1 tablet by mouth 5 (five)  times daily as needed., Disp: 150 tablet, Rfl: 0   oxyCODONE -acetaminophen  (PERCOCET) 10-325 MG tablet, Take 1 tablet by mouth 5 (five) times daily as needed., Disp: 150 tablet, Rfl: 0   polyethylene glycol (MIRALAX  / GLYCOLAX ) 17 g packet, Take 17 g by mouth daily as needed for moderate constipation., Disp: , Rfl:    polyethylene glycol powder (GLYCOLAX /MIRALAX ) 17 GM/SCOOP powder, Mix as directed and take 1 capful (17 g) by mouth daily as needed for moderate constipation. (Patient not taking: Reported on 01/12/2024), Disp: 238 g, Rfl: 0   prochlorperazine  (COMPAZINE ) 10 MG tablet, Take 1 tablet (10 mg total) by mouth every 6 (six) hours as needed for nausea or vomiting., Disp: 90 tablet, Rfl: 0   promethazine  (PHENERGAN ) 12.5 MG tablet, Take 1 tablet (12.5 mg total) by mouth every 6 (six) hours as needed for nausea or vomiting., Disp: 30 tablet, Rfl: 0   rosuvastatin  (CRESTOR ) 40 MG tablet, Take 1 tablet (40 mg total) by mouth daily., Disp: 90 tablet, Rfl: 1   sevelamer  carbonate (RENVELA ) 800 MG tablet, Take 1,600-3,200 mg by mouth See admin instructions. Take 3200 mg with each meal and 1600 mg with each snack, Disp: , Rfl:    Allergies  Allergen Reactions   Adhesive [Tape] Other (See Comments)    Irritation     Past Medical History:  Diagnosis Date   Abnormal uterine bleeding (AUB)    Anxiety    Arthritis of knee    Left, Gel and cortisone injections @ [facility]   Asthma    prn inhaler   Bipolar disorder (HCC)    CAD S/P BMS PCI to prox LAD cardiologist--- dr Lucrecia Sables LAD to Mid LAD lesion, 90% stenosed. Post intervention - Vision BMS 3.0 mm x 18 mm (~3.5 mm) there is a 0% residual stenosis. 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   left 2018; right 2019   Depression    Diabetic foot ulcer (HCC) 04/22/2019   Diabetic neuropathy (HCC)    feet   Diabetic retinopathy, nonproliferative,  severe (HCC)    bilateral   Dyspnea    with too much fluid   ESRD (end stage renal disease) on dialysis [facility])    ?chronic interstitial nephritis  ([facility]   H/O non-ST elevation myocardial infarction (NSTEMI) 03/2016   Found in 90% mid LAD lesion treated with bare-metal stent (BMS) PCI - vision  BMS 3.0 mm x 18 mm   Heart murmur    History of blood transfusion    History of MRSA infection 2007   Hyperlipidemia    Hypertension    IDA (iron deficiency anemia)    Insulin  dependent type 2 diabetes mellitus (HCC)    Iron deficiency anemia    takes iron supplement   Myocardial infarction [facility])    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   PAOD (peripheral arterial occlusive disease) (HCC)    vascular--- dr Farrel Hones   Pneumonia 11/08/2021   PONV (postoperative nausea and vomiting)    S/P arteriovenous (AV) fistula creation 07/2017   S/p bare metal coronary artery stent 03/31/2016   BMS x1  to pLAD   Sickle cell trait (HCC)    Ulcer of left foot due to type 2 diabetes mellitus (HCC) 06/03/2019     Past Surgical History:  Procedure Laterality Date   A/V FISTULAGRAM N/A 04/13/2021   Procedure: A/V FISTULAGRAM - Right Arm;  Surgeon: Margherita Shell, MD;  [facility];  Service: Cardiovascular;  Laterality: N/A;   ACHILLES TENDON LENGTHENING Left 11/24/2016   Procedure: Left Achilles tendon lengthening (open);  Surgeon: Amada Backer, MD;  Location: [facility];  Service: Orthopedics;  Laterality: Left;   AV FISTULA PLACEMENT Right 07/31/2017   Procedure: ARTERIOVENOUS BRACHIOCEPHALIC FISTULA CREATION RIGHT ARM;  Surgeon: Arvil Lauber, MD;  Location[facility];  Service: Vascular;  Laterality: Right;   CALCANEAL OSTEOTOMY Left 11/24/2016   Procedure: Left hindfoot osteotomy and fusion;  Surgeon: Amada Backer, MD;  Location: [facility];  Service: Orthopedics;  Laterality: Left;   CARDIAC CATHETERIZATION  N/A 03/31/2016   Procedure: Left Heart Cath and Coronary Angiography;  Surgeon: Avanell Leigh, MD;  Location: [facility] INVASIVE CV LAB: 90% early mLAD, normal LV Fxn   CARDIAC CATHETERIZATION N/A 03/31/2016   Procedure: Coronary Stent Intervention;  Surgeon: Avanell Leigh, MD;  [facility];  Service: Cardiovascular: PCI to mLAD BMS Vision 3.0 mm x 18 mm   CESAREAN SECTION  1997   CYSTOSCOPY N/A 11/13/2023   Procedure: CYSTOSCOPY;  Surgeon: Kiki Pelton, MD;  Location: [facility];  Service: Gynecology;  Laterality: N/A;   CYSTOSCOPY WITH FULGERATION N/A 08/30/2022   Procedure: CYSTOSCOPY WITH FULGERATION, BIOPSY, LEFT RETROGRADE PYELOGRAM;  Surgeon: Roxane Copp, MD;  Location: [facility];  Service: Urology;  Laterality: N/A;   DILITATION & CURRETTAGE/HYSTROSCOPY WITH NOVASURE ABLATION N/A 09/15/2020   Procedure: DILATATION & CURETTAGE/HYSTEROSCOPY;  Surgeon: Lenord Radon, MD;  Location: [facility];  Service: Gynecology;  Laterality: N/A;   DILITATION & CURRETTAGE/HYSTROSCOPY WITH NOVASURE ABLATION N/A 12/21/2021   Procedure: DILATATION & CURETTAGE/HYSTEROSCOPY WITH NOVASURE ABLATION/ IUD REMOVAL;  Surgeon: Lenord Radon, MD;  Location: [facility];  Service: Gynecology;  Laterality: N/A;   FISTULA SUPERFICIALIZATION Right 11/15/2017   Procedure: FISTULA SUPERFICIALIZATION RIGHT BRACHIOCEPHALIC  RIGHT;  Surgeon: Arvil Lauber, MD;  Location: [facility] OR;  Service: Vascular;  Laterality: Right;   FOOT SURGERY     multiple for charcot   PERIPHERAL VASCULAR BALLOON ANGIOPLASTY Right 04/13/2021   Procedure: PERIPHERAL VASCULAR BALLOON ANGIOPLASTY;  Surgeon: Margherita Shell, MD;  [facility];  Service: Cardiovascular;  Laterality: Right;  arm fistula   TOTAL KNEE ARTHROPLASTY Left 07/19/2023   Procedure: TOTAL KNEE ARTHROPLASTY;  Surgeon: Adonica Hoose, MD;  [facility];  Service: Orthopedics;  Laterality: Left;  150    TOTAL LAPAROSCOPIC HYSTERECTOMY WITH SALPINGECTOMY Bilateral 11/13/2023   Procedure: TOTAL LAPAROSCOPIC HYSTERECTOMY WITH SALPINGECTOMY;  Surgeon: Kiki Pelton, MD;  [facility];  Service: Gynecology;  Laterality: Bilateral;   TRANSTHORACIC ECHOCARDIOGRAM  02/2021   EF 55 to 60%.  No R WMA.  Mild septal LVH with moderate posterior hypertrophy.  Normal RV size and function.  Normal RVP/PASP.Aaron Aas  Trivial MR.   TUBAL LIGATION  1997    Family History  Problem Relation Age of Onset   Stroke Mother    Diabetes Mother    Hypertension Mother    Aneurysm Mother    Diabetes Father    Hypertension Father    Diabetes Sister    Hypertension Sister    Diabetes Maternal Grandmother    Diabetes Maternal Grandfather    Cancer Paternal Grandfather        Prostate    Social History   Tobacco Use   Smoking status: Former    Current packs/day: 0.00    Types: Cigarettes    Quit date: 02/28/2015    Years since quitting: 9.0   Smokeless tobacco: Never  Vaping Use   Vaping status: Never Used  Substance Use Topics   Alcohol use: Not Currently    Comment: ~ 1 a month   Drug use: Not Currently    Comment: last use 03/2021    ROS Refer to HPI for ROS details.  Objective:   Vitals: BP (!) 208/126 (BP Location: Left Arm) Comment: pt reports she missed dialysis on Friday  Pulse 100   Temp 98.2 F (36.8 C)   Resp 17   LMP  (LMP Unknown)   SpO2 93%   Physical Exam Vitals and nursing note reviewed.  Constitutional:      General: She is not in acute distress.    Appearance: She is well-developed. She is not ill-appearing or toxic-appearing.  HENT:     Head: Normocephalic and atraumatic.  Cardiovascular:     Rate and Rhythm: Normal rate.  Pulmonary:     Effort: Pulmonary effort is normal. No respiratory distress.  Genitourinary:    Vagina: No vaginal discharge.  Skin:    General: Skin is warm and dry.  Neurological:     General: No focal deficit present.     Mental Status:  She is alert and oriented to person, place, and time.  Psychiatric:        Mood and Affect: Mood normal.        Behavior: Behavior normal.     Procedures  No results found. However, due to the size of the patient record, not all encounters were searched. Please check Results Review for a complete set of results.  No results found.   Assessment and Plan :     Discharge Instructions      1. Possible exposure to STD (Primary) - Cervicovaginal collected in UC and sent to lab for further testing results should be available in 1 to 2 days - HIV Antibody (routine testing w rflx) collected and sent to lab for further testing results should be available in 1 to 2 days - RPRcollected and sent to lab for further testing results should be available in 1 to 2 days -Continue to monitor symptoms for any change in severity if there is any escalation of current symptoms or development of new symptoms follow-up in ER for further evaluation and management.     [place] Depascale B [employer]cia [employer]   Melroy Bougher, [place], [place] 03/04/24 1014

## 2024-03-05 ENCOUNTER — Ambulatory Visit (INDEPENDENT_AMBULATORY_CARE_PROVIDER_SITE_OTHER): Admitting: Licensed Clinical Social Worker

## 2024-03-05 DIAGNOSIS — F321 Major depressive disorder, single episode, moderate: Secondary | ICD-10-CM

## 2024-03-05 LAB — CERVICOVAGINAL ANCILLARY ONLY
Bacterial Vaginitis (gardnerella): NEGATIVE
Candida Glabrata: NEGATIVE
Candida Vaginitis: NEGATIVE
Chlamydia: NEGATIVE
Comment: NEGATIVE
Comment: NEGATIVE
Comment: NEGATIVE
Comment: NEGATIVE
Comment: NEGATIVE
Comment: NORMAL
Neisseria Gonorrhea: NEGATIVE
Trichomonas: NEGATIVE

## 2024-03-05 LAB — RPR: RPR Ser Ql: NONREACTIVE

## 2024-03-05 NOTE — BH Specialist Note (Signed)
 Patient no-showed today's appointment; appointment was for Telephone visit at 1:30Pm  Behavioral Health Clinician [facility] from here on out)  attempted patient  via Telephone.   [name] contacted patient from telephone number [phone]. [facility] left a  VM.   [facility] will attempt patient again on 04/23 via telehealth at 3pm.   Amie Bald, MSW, LCSW-A She/Her Behavioral Health Clinician [facility]  Internal Medicine Center Direct Dial [phone]  Fax [phone] Main Office Phone: [phone] [address] [place] [place], [place] [address] Website: [facility] Internal Medicine [facility]  [place], [place]  [facility]

## 2024-03-06 ENCOUNTER — Ambulatory Visit (INDEPENDENT_AMBULATORY_CARE_PROVIDER_SITE_OTHER): Admitting: Licensed Clinical Social Worker

## 2024-03-06 DIAGNOSIS — F321 Major depressive disorder, single episode, moderate: Secondary | ICD-10-CM | POA: Diagnosis not present

## 2024-03-06 NOTE — BH Specialist Note (Signed)
 Integrated Behavioral Health via Telemedicine Visit  03/06/2024 MICHOL CAN [id number]  Number of Integrated Behavioral Health Clinician visits: Additional Visit  Session Start time: 1500   Session End time: 1530  Total time in minutes: 30   Referring Provider: PCP Patient/Family location: Home [facility] Provider location: Office All persons participating in visit: [facility] and Patient Types of Service: Individual psychotherapy and Telephone visit   I connected with Jane [name] via  Telephone  and verified that I am speaking with the correct person using two identifiers. Discussed confidentiality: Yes    I discussed the limitations of telemedicine and the availability of in person appointments.  Discussed there is a possibility of technology failure and discussed alternative modes of communication if that failure occurs.   I discussed that engaging in this telemedicine visit, they consent to the provision of behavioral healthcare and the services will be billed under their insurance.   Patient and/or legal guardian expressed understanding and consented to Telemedicine visit: Yes    Presenting Concerns: Patient and/or family reports the following symptoms/concerns: [facility] conducted a session with the patient today, beginning with a check-in to assess the patient's current state. Patient stated she was doing ok. Patient discussed transition in housing. [facility] reflected with patient and discussed ongoing goals.  Patient has some financial conflict , but despite these challenges, the patient reported feeling better overall and continuing on her path of recovery.   Session concluded with reviewing Interventions for conflicts resolution and mood regulation. We discussed coping skills of mindfulness meditation. This involves focusing on the present moment and using breathing techniques to calm the mind and body, helping to reduce stress and prevent impulsive reactions. Additionally,  they explored a de-escalation technique: active listening and empathy.    Patient and/or Family's Strengths/Protective Factors: Sense of purpose   Goals Addressed: Patient will:  Reduce symptoms of: stress and family conflict     Progress towards Goals: Ongoing   Interventions: Interventions utilized:  Mindfulness or Relaxation Training     Patient and/or Family Response: Patient reports OPT is beneficial.   Assessment: Patient currently experiencing Relationship Conflict.    Patient may benefit from Ongoing OPT.   Plan: Follow up with behavioral health clinician on : Patient will contact office to schedule next session.   I discussed the assessment and treatment plan with the patient and/or parent/guardian. They were provided an opportunity to ask questions and all were answered. They agreed with the plan and demonstrated an understanding of the instructions.   They were advised to call back or seek an in-person evaluation if the symptoms worsen or if the condition fails to improve as anticipated.   Amie Bald, MSW, LCSW-A She/Her Behavioral Health Clinician [facility]  Internal Medicine Center Direct Dial :[phone]  Fax [phone] Main Office Phone: [phone] [address] [place] [place], [place] [address] Website: [facility] Internal Medicine [facility]  [place], [place]  [facility]

## 2024-03-08 ENCOUNTER — Ambulatory Visit
Admission: RE | Admit: 2024-03-08 | Discharge: 2024-03-08 | Disposition: A | Discharge status: Home / Self Care | Source: Ambulatory Visit | Attending: Internal Medicine | Admitting: Internal Medicine

## 2024-03-08 ENCOUNTER — Encounter (HOSPITAL_COMMUNITY): Payer: Self-pay

## 2024-03-08 ENCOUNTER — Other Ambulatory Visit: Payer: Self-pay | Admitting: Internal Medicine

## 2024-03-08 DIAGNOSIS — R928 Other abnormal and inconclusive findings on diagnostic imaging of breast: Secondary | ICD-10-CM

## 2024-03-08 DIAGNOSIS — R921 Mammographic calcification found on diagnostic imaging of breast: Secondary | ICD-10-CM

## 2024-03-18 ENCOUNTER — Telehealth: Payer: Self-pay | Admitting: *Deleted

## 2024-03-18 NOTE — Telephone Encounter (Signed)
 Mammogram rescheduled to 09/10/2024 @ 11:40 am breast center [phone].

## 2024-03-27 ENCOUNTER — Other Ambulatory Visit (HOSPITAL_COMMUNITY): Payer: Self-pay

## 2024-03-27 MED ORDER — OXYCODONE-ACETAMINOPHEN 10-325 MG PO TABS
1.0000 | ORAL_TABLET | Freq: Every day | ORAL | 0 refills | Status: AC
  Filled 2024-03-27 – 2024-03-29 (×2): qty 150, 30d supply, fill #0

## 2024-03-29 ENCOUNTER — Ambulatory Visit: Attending: Cardiology | Admitting: Cardiology

## 2024-03-29 ENCOUNTER — Other Ambulatory Visit (HOSPITAL_COMMUNITY): Payer: Self-pay

## 2024-03-29 ENCOUNTER — Other Ambulatory Visit: Payer: Self-pay

## 2024-03-29 ENCOUNTER — Encounter: Payer: Self-pay | Admitting: Cardiology

## 2024-03-29 VITALS — BP 156/82 | HR 90 | Ht 65.0 in | Wt 204.8 lb

## 2024-03-29 DIAGNOSIS — E781 Pure hyperglyceridemia: Secondary | ICD-10-CM

## 2024-03-29 DIAGNOSIS — Z9861 Coronary angioplasty status: Secondary | ICD-10-CM | POA: Diagnosis not present

## 2024-03-29 DIAGNOSIS — R000 Tachycardia, unspecified: Secondary | ICD-10-CM

## 2024-03-29 DIAGNOSIS — I779 Disorder of arteries and arterioles, unspecified: Secondary | ICD-10-CM | POA: Diagnosis not present

## 2024-03-29 DIAGNOSIS — I2510 Atherosclerotic heart disease of native coronary artery without angina pectoris: Secondary | ICD-10-CM

## 2024-03-29 DIAGNOSIS — E1169 Type 2 diabetes mellitus with other specified complication: Secondary | ICD-10-CM

## 2024-03-29 DIAGNOSIS — E785 Hyperlipidemia, unspecified: Secondary | ICD-10-CM | POA: Diagnosis not present

## 2024-03-29 DIAGNOSIS — N186 End stage renal disease: Secondary | ICD-10-CM

## 2024-03-29 DIAGNOSIS — Z992 Dependence on renal dialysis: Secondary | ICD-10-CM

## 2024-03-29 DIAGNOSIS — I110 Hypertensive heart disease with heart failure: Secondary | ICD-10-CM

## 2024-03-29 DIAGNOSIS — I5032 Chronic diastolic (congestive) heart failure: Secondary | ICD-10-CM

## 2024-03-29 MED ORDER — METOPROLOL TARTRATE 25 MG PO TABS
25.0000 mg | ORAL_TABLET | Freq: Two times a day (BID) | ORAL | 0 refills | Status: AC
  Filled 2024-03-29: qty 180, 90d supply, fill #0

## 2024-03-29 MED ORDER — ROSUVASTATIN CALCIUM 40 MG PO TABS
40.0000 mg | ORAL_TABLET | Freq: Every day | ORAL | 3 refills | Status: AC
  Filled 2024-03-29: qty 90, 90d supply, fill #0

## 2024-03-29 MED ORDER — ICOSAPENT ETHYL 1 G PO CAPS
2.0000 g | ORAL_CAPSULE | Freq: Two times a day (BID) | ORAL | 3 refills | Status: AC
Start: 2024-03-29 — End: ?
  Filled 2024-03-29: qty 360, 90d supply, fill #0
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
## 2024-03-29 NOTE — Patient Instructions (Addendum)
 Medication Instructions:   Restart metoprolol    twice day  do not take night before dialysis and take 2 hour after dialysis  *If you need a refill on your cardiac medications before your next appointment, please call your pharmacy*   Lab Work:fasting Lipid  If you have labs (blood work) drawn today and your tests are completely normal, you will receive your results only by: MyChart Message (if you have MyChart) OR A paper copy in the mail If you have any lab test that is abnormal or we need to change your treatment, we will call you to review the results.   Testing/Procedures:  Not needed  Follow-Up: At [facility], you and your health needs are our priority.  As part of our continuing mission to provide you with exceptional heart care, we have created designated Provider Care Teams.  These Care Teams include your primary Cardiologist (physician) and Advanced Practice Providers (APPs -  Physician Assistants and Nurse Practitioners) who all work together to provide you with the care you need, when you need it.     Your next appointment:   6 month(s)  The format for your next appointment:   In Person  Provider:   Katlyn West, NP      Then, Randene Bustard, MD will plan to see you again in 12 month(s).

## 2024-04-01 ENCOUNTER — Encounter: Payer: Self-pay | Admitting: Cardiology

## 2024-04-01 NOTE — Assessment & Plan Note (Signed)
 BMS PCI to the LAD back in 2017.  Has not necessarily had any further anginal type chest pain.  Occasionally has some mild chest pain that occurs with dialysis but also some mild sharp stabbing type symptoms that occur with or without exertion.  Not likely to be cardiac in nature.  Most recent Myoview  in 2019 was negative. I suspect that if she goes under the watchful eye of the transplant coordinators from [facility] that she will probably require follow-up ischemic evaluation.  This can be done either with us  here at [facility] or to the [facility].  Would be leery of the potential for breast attenuation however the has had at least 1 relatively normal Myoview  without breast attenuation.  She is now on aspirin  81 mg and rosuvastatin  40 mg.  Continue current meds. - Restart metoprolol  tartrate 25 mg twice daily but hold the dose p.m. and a.m. prior to dialysis.

## 2024-04-01 NOTE — Assessment & Plan Note (Signed)
 Labile blood pressures make this difficult to manage.  Thankfully, her echocardiogram does not show significant LVH. Because of hypotension during dialysis she is no longer taking amlodipine -telmisartan (Azor  5-20 mg) As her blood pressure seems to be more stable now we will restart low pressure 25 mg p.o. daily but have her hold the dose the evening before and morning of dialysis.  Will otherwise defer BP management to nephrology

## 2024-04-01 NOTE — Assessment & Plan Note (Signed)
 Will need to reassess lower extremity arterial Dopplers.

## 2024-04-01 NOTE — Assessment & Plan Note (Signed)
 Undergoing dialysis, managing antihypertensive medications to prevent hypotension. Evaluated for transplant list, potential cardiac testing required. - Restart metoprolol  25 mg BID, omit dose night before and morning of dialysis. - Coordinate with transplant team for potential stress test if required.

## 2024-04-01 NOTE — Assessment & Plan Note (Addendum)
 Last lipids were from July 2024.  LDL was very poorly controlled at that time.  We have not seen labs since. - Check lipid panel in the next week or 2.  Anticipate referral to lipid clinic based on results.  Continue diabetes per PCP and endocrinology.

## 2024-04-01 NOTE — Assessment & Plan Note (Signed)
 History of sinus tachycardia with palpitations.  We will therefore restart beta-blocker 25 mg twice daily as noted.

## 2024-04-24 ENCOUNTER — Other Ambulatory Visit (HOSPITAL_COMMUNITY): Payer: Self-pay

## 2024-04-24 MED ORDER — OXYCODONE-ACETAMINOPHEN 10-325 MG PO TABS
1.0000 | ORAL_TABLET | Freq: Every day | ORAL | 0 refills | Status: DC | PRN
  Filled 2024-04-29: qty 150, 30d supply, fill #0

## 2024-04-29 ENCOUNTER — Other Ambulatory Visit (HOSPITAL_COMMUNITY): Payer: Self-pay

## 2024-05-08 ENCOUNTER — Telehealth: Payer: Self-pay | Admitting: Emergency Medicine

## 2024-05-08 ENCOUNTER — Ambulatory Visit
Admission: RE | Admit: 2024-05-08 | Discharge: 2024-05-08 | Disposition: A | Discharge status: Home / Self Care | Source: Ambulatory Visit

## 2024-05-08 ENCOUNTER — Ambulatory Visit: Payer: Self-pay

## 2024-05-08 ENCOUNTER — Ambulatory Visit (INDEPENDENT_AMBULATORY_CARE_PROVIDER_SITE_OTHER): Admitting: Radiology

## 2024-05-08 ENCOUNTER — Encounter (HOSPITAL_COMMUNITY): Payer: Self-pay

## 2024-05-08 ENCOUNTER — Other Ambulatory Visit (HOSPITAL_COMMUNITY): Payer: Self-pay

## 2024-05-08 VITALS — BP 133/77 | HR 94 | Temp 98.4°F | Resp 16

## 2024-05-08 DIAGNOSIS — G9331 Postviral fatigue syndrome: Secondary | ICD-10-CM | POA: Diagnosis not present

## 2024-05-08 DIAGNOSIS — R053 Chronic cough: Secondary | ICD-10-CM | POA: Diagnosis not present

## 2024-05-08 DIAGNOSIS — J45909 Unspecified asthma, uncomplicated: Secondary | ICD-10-CM | POA: Diagnosis present

## 2024-05-08 DIAGNOSIS — Z113 Encounter for screening for infections with a predominantly sexual mode of transmission: Secondary | ICD-10-CM | POA: Diagnosis present

## 2024-05-08 LAB — POCT URINE PREGNANCY: Preg Test, Ur: NEGATIVE

## 2024-05-08 MED ORDER — PROMETHAZINE-DM 6.25-15 MG/5ML PO SYRP
5.0000 mL | ORAL_SOLUTION | Freq: Four times a day (QID) | ORAL | 0 refills | Status: DC | PRN
  Filled 2024-05-08: qty 118, 6d supply, fill #0

## 2024-05-08 MED ORDER — ALBUTEROL SULFATE HFA 108 (90 BASE) MCG/ACT IN AERS
2.0000 | INHALATION_SPRAY | RESPIRATORY_TRACT | 2 refills | Status: DC | PRN
Start: 2024-05-08 — End: 2024-05-08
  Filled 2024-05-08: qty 17, 64d supply, fill #0

## 2024-05-08 MED ORDER — AZELASTINE HCL 0.1 % NA SOLN
1.0000 | Freq: Two times a day (BID) | NASAL | 1 refills | Status: DC
  Filled 2024-05-08: qty 30, 100d supply, fill #0

## 2024-05-08 MED ORDER — AZELASTINE HCL 0.1 % NA SOLN: 1.0000 | Freq: Two times a day (BID) | NASAL | 1 refills | Status: AC

## 2024-05-08 MED ORDER — ALBUTEROL SULFATE HFA 108 (90 BASE) MCG/ACT IN AERS
2.0000 | INHALATION_SPRAY | RESPIRATORY_TRACT | 2 refills | Status: DC | PRN
Start: 2024-05-08 — End: 2024-10-02

## 2024-05-08 MED ORDER — PROMETHAZINE-DM 6.25-15 MG/5ML PO SYRP
5.0000 mL | ORAL_SOLUTION | Freq: Four times a day (QID) | ORAL | 0 refills | Status: DC | PRN
Start: 2024-05-08 — End: 2024-08-23

## 2024-05-08 NOTE — Telephone Encounter (Signed)
 FYI Only or Action Required?: FYI only for provider.  Patient was last seen in primary care on 01/01/2024 by Gregary Sharper, MD. Called Nurse Triage reporting Cough. Symptoms began several weeks ago. Interventions attempted: Prescription medications: Tessalon . Symptoms are: gradually worsening.  Triage Disposition: See Physician Within 24 Hours (overriding See HCP Within 4 Hours (Or PCP Triage))  Patient/caregiver understands and will follow disposition?: Yes                             Copied from CRM [id number]. Topic: Clinical - Red Word Triage >> May 08, 2024 10:17 AM Jane [name] wrote: Red Word that prompted transfer to Nurse Triage: Nauseous, chest hurting, shortness of breath, dry cough, may have a fever, clammy and perspiring. Looking to schedule an appointment. Reason for Disposition  [1] MILD difficulty breathing (e.g., minimal/no SOB at rest, SOB with walking, pulse <100) AND [2] still present when not coughing  Answer Assessment - Initial Assessment Questions 1. ONSET: When did the cough begin?      Weeks ago, additional symptoms started this morning 2. SEVERITY: How bad is the cough today?      Frequent coughing spells, states cough interacts with daily activities  3. SPUTUM: Describe the color of your sputum (none, dry cough; clear, white, yellow, green)     Denies phlegm  4. HEMOPTYSIS: Are you coughing up any blood? If so ask: How much? (flecks, streaks, tablespoons, etc.)     Denies 5. DIFFICULTY BREATHING: Are you having difficulty breathing? If Yes, ask: How bad is it? (e.g., mild, moderate, severe)    - MILD: No SOB at rest, mild SOB with walking, speaks normally in sentences, can lie down, no retractions, pulse < 100.    - MODERATE: SOB at rest, SOB with minimal exertion and prefers to sit, cannot lie down flat, speaks in phrases, mild retractions, audible wheezing, pulse 100-120.    - SEVERE: Very SOB at rest, speaks in single words,  struggling to breathe, sitting hunched forward, retractions, pulse > 120      I could be breathing better, but I am breathing ok now, noticing SOB when laying flat and upon exertion, patient able to speak in clear and complete sentences while on phone with this RN  6. FEVER: Do you have a fever? If Yes, ask: What is your temperature, how was it measured, and when did it start?     Does not have a thermometer, feels feverish  7. CARDIAC HISTORY: Do you have any history of heart disease? (e.g., heart attack, congestive heart failure)      Heart attack , heart murmur 8. LUNG HISTORY: Do you have any history of lung disease?  (e.g., pulmonary embolus, asthma, emphysema)     Asthma 10. OTHER SYMPTOMS: Do you have any other symptoms? (e.g., runny nose, wheezing, chest pain)     Chest pain due to cough, sore throat, runny nose, denies wheezing    No appointments in office this week, patient going to UC today.  Protocols used: Cough - Acute Non-Productive-A-AH

## 2024-05-08 NOTE — ED Triage Notes (Addendum)
 Pt c/o cough, chest congestion for over 1 month. She has had sore throat for about 1 .5 weeks Felt slightly nauseous and fatigued this morning.    Pt would also like STD testing due to new partner. Does not want blood work

## 2024-05-08 NOTE — ED Provider Notes (Signed)
 History [facility]  Note:  This document was prepared using Dragon voice recognition software and may include unintentional dictation errors.  MRN: [id number] DOB: 1975/12/31  Subjective:   Jane [name] is a 48 y.o. female presenting for persistent cough, chest congestion, nasal congestion, fatigue x 1 month.  Patient reports that she initially had a sore throat that ended about a week and a half ago.  Having some mild nausea and persistent fatigue.  Patient denies any fever, chest pain, weakness, dizziness.  Patient has not been taking any over-the-counter medication to treat symptoms.  Patient states that occasionally she has shortness of breath with coughing.  Patient has history of asthma and currently is out of her inhalers.  Patient also requesting STD screening today in UC due to a new sexual partner.  Patient denies any concern for syphilis or HIV but would like vaginal swab collected.  Patient denies any dysuria, vaginal discharge, vaginal lesion, abdominal pain, flank pain.  No current facility-administered medications for this encounter.  Current Outpatient Medications:    acetaminophen  (TYLENOL ) 500 MG tablet, Take 2 tablets (1,000 mg total) by mouth every 8 (eight) hours as needed for moderate pain, mild pain, fever or headache., Disp: 30 tablet, Rfl: 0   albuterol  (PROVENTIL  HFA) 108 (90 Base) MCG/ACT inhaler, Inhale 2 puffs into the lungs every 4 (four) hours as needed for coughing, wheezing, or shortness of breath., Disp: 17 g, Rfl: 2   alum & mag hydroxide-simeth (MAALOX/MYLANTA) 200-200-20 MG/5ML suspension, Take 15 mLs by mouth every 4 (four) hours as needed for indigestion or heartburn., Disp: 355 mL, Rfl: 0   azelastine (ASTELIN) 0.1 % nasal spray, Place 1 spray into both nostrils 2 (two) times daily. Use in each nostril as directed, Disp: 30 mL, Rfl: 1   benzonatate  (TESSALON ) 100 MG capsule, Take 1 capsule (100 mg total) by mouth 3 (three) times  daily as needed for cough., Disp: 21 capsule, Rfl: 0   cetirizine  (ZYRTEC ) 10 MG tablet, Take 10 mg by mouth daily., Disp: , Rfl:    Continuous Glucose Sensor (DEXCOM G7 SENSOR) MISC, Use it for 10 days to continuously monitor your blood sugars., Disp: 9 each, Rfl: 3   dicyclomine  (BENTYL ) 10 MG capsule, Take 1 capsule (10 mg total) by mouth 3 (three) times daily before meals., Disp: 90 capsule, Rfl: 0   DULoxetine  (CYMBALTA ) 30 MG capsule, Take 1 capsule (30 mg total) by mouth daily., Disp: 30 capsule, Rfl: 1   gabapentin  (NEURONTIN ) 100 MG capsule, Take 1 capsule (100 mg total) by mouth at bedtime., Disp: 90 capsule, Rfl: 1   icosapent  Ethyl (VASCEPA ) 1 g capsule, Take 2 capsules (2 g total) by mouth 2 (two) times daily., Disp: 360 capsule, Rfl: 3   injection device for insulin  (INPEN 100-PINK-LILLY-HUMALOG ) DEVI, Use to inject Humalog  insulin  for meals and correction insulin , Disp: 1 each, Rfl: 1   insulin  degludec (TRESIBA ) 100 UNIT/ML FlexTouch Pen, Inject 24 Units into the skin at bedtime., Disp: 15 mL, Rfl: 1   insulin  lispro (HUMALOG ) 100 UNIT/ML cartridge, Inject 6-20 Units total into the skin 3 (three) times daily with meals. Use with inpen to inject at meal time and for correction insulin  up to 40 units daily, Disp: 36 mL, Rfl: 3   lanthanum  (FOSRENOL ) 1000 MG chewable tablet, Chew 1 tablet (1,000 mg total) by mouth 3 (three) times daily with meals., Disp: 270 tablet, Rfl: 4   leflunomide  (ARAVA ) 10 MG tablet, Take 1 tablet (10  mg total) by mouth daily., Disp: 30 tablet, Rfl: 2   leflunomide  (ARAVA ) 10 MG tablet, Take 1 tablet (10 mg total) by mouth daily., Disp: 30 tablet, Rfl: 2   methocarbamol  (ROBAXIN ) 500 MG tablet, Take 1 tablet (500 mg total) by mouth every 6 (six) hours as needed for muscle spasms., Disp: 20 tablet, Rfl: 0   metoCLOPramide  (REGLAN ) 10 MG tablet, Take 1 tablet by mouth once a day as needed, Disp: 30 tablet, Rfl: 0   metoCLOPramide  (REGLAN ) 5 MG tablet, Take 1 tablet  (5 mg total) by mouth every 6 (six) hours as needed., Disp: 120 tablet, Rfl: 0   metoprolol  tartrate (LOPRESSOR ) 25 MG tablet, Take 1 tablet (25 mg total) by mouth 2 (two) times daily. Do not take the night before dialysis, and take 2 hours after dialysis., Disp: 180 tablet, Rfl: 0   Multiple Vitamins-Minerals (HAIR SKIN & NAILS) TABS, Take 1 tablet by mouth daily., Disp: , Rfl:    multivitamin (RENA-VIT) TABS tablet, Take 1 tablet by mouth daily., Disp: 90 tablet, Rfl: 3   naloxone  (NARCAN ) nasal spray 4 mg/0.1 mL, Place 1 spray into the nose as needed if found unresponsive and call 911 immediately., Disp: 2 each, Rfl: 12   omeprazole  (PRILOSEC) 20 MG capsule, Take 1 capsule (20 mg total) by mouth daily., Disp: 30 capsule, Rfl: 1   oxyCODONE -acetaminophen  (PERCOCET) 10-325 MG tablet, Take 1 tablet by mouth 5 (five) times daily as needed., Disp: 150 tablet, Rfl: 0   oxyCODONE -acetaminophen  (PERCOCET) 10-325 MG tablet, Take 1 tablet by mouth 5 (five) times daily as needed., Disp: 150 tablet, Rfl: 0   polyethylene glycol (MIRALAX  / GLYCOLAX ) 17 g packet, Take 17 g by mouth daily as needed for moderate constipation., Disp: , Rfl:    prochlorperazine  (COMPAZINE ) 10 MG tablet, Take 1 tablet (10 mg total) by mouth every 6 (six) hours as needed for nausea or vomiting., Disp: 90 tablet, Rfl: 0   promethazine  (PHENERGAN ) 12.5 MG tablet, Take 1 tablet (12.5 mg total) by mouth every 6 (six) hours as needed for nausea or vomiting., Disp: 30 tablet, Rfl: 0   promethazine -dextromethorphan  (PROMETHAZINE -DM) 6.25-15 MG/5ML syrup, Take 5 mLs by mouth 4 (four) times daily as needed for cough., Disp: 118 mL, Rfl: 0   rosuvastatin  (CRESTOR ) 40 MG tablet, Take 1 tablet (40 mg total) by mouth daily., Disp: 90 tablet, Rfl: 3   sevelamer  carbonate (RENVELA ) 800 MG tablet, Take 1,600-3,200 mg by mouth See admin instructions. Take 3200 mg with each meal and 1600 mg with each snack, Disp: , Rfl:    Allergies  Allergen  Reactions   Adhesive [Tape] Other (See Comments)    Irritation     Past Medical History:  Diagnosis Date   Abnormal uterine bleeding (AUB)    Anxiety    Arthritis of knee    Left, Gel and cortisone injections @ [facility]   Asthma    prn inhaler   Bipolar disorder (HCC)    CAD S/P BMS PCI to prox LAD cardiologist--- dr anner Pais LAD to Mid LAD lesion, 90% stenosed. Post intervention - Vision BMS 3.0 mm x 18 mm (~3.5 mm) there is a 0% residual stenosis. 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   left 2018; right 2019   Depression    Diabetic foot ulcer (HCC) 04/22/2019   Diabetic neuropathy (HCC)  feet   Diabetic retinopathy, nonproliferative, severe (HCC)    bilateral   Dyspnea    with too much fluid   ESRD (end stage renal disease) on dialysis [facility])    ?chronic interstitial nephritis  ([facility]   H/O non-ST elevation myocardial infarction (NSTEMI) 03/2016   Found in 90% mid LAD lesion treated with bare-metal stent (BMS) PCI - vision BMS 3.0 mm x 18 mm   Heart murmur    History of blood transfusion    History of MRSA infection 2007   Hyperlipidemia    Hypertension    IDA (iron deficiency anemia)    Insulin  dependent type 2 diabetes mellitus (HCC)    Iron deficiency anemia    takes iron supplement   Myocardial infarction [facility])    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   PAOD (peripheral arterial occlusive disease) (HCC)    vascular--- dr laurence   Pneumonia 11/08/2021   PONV (postoperative nausea and vomiting)    S/P arteriovenous (AV) fistula creation 07/2017   S/p bare metal coronary artery stent 03/31/2016   BMS x1  to pLAD   Sickle cell trait (HCC)    Ulcer of left foot due to type 2 diabetes mellitus (HCC) 06/03/2019     Past Surgical History:  Procedure Laterality Date   A/V FISTULAGRAM N/A 04/13/2021    Procedure: A/V FISTULAGRAM - Right Arm;  Surgeon: Serene Gaile ORN, MD;  Location: [facility];  Service: Cardiovascular;  Laterality: N/A;   ACHILLES TENDON LENGTHENING Left 11/24/2016   Procedure: Left Achilles tendon lengthening (open);  Surgeon: Norleen Armor, MD;  Location: [facility];  Service: Orthopedics;  Laterality: Left;   AV FISTULA PLACEMENT Right 07/31/2017   Procedure: ARTERIOVENOUS BRACHIOCEPHALIC FISTULA CREATION RIGHT ARM;  Surgeon: laurence Redell CROME, MD;  Location: [facility] OR;  Service: Vascular;  Laterality: Right;   CALCANEAL OSTEOTOMY Left 11/24/2016   Procedure: Left hindfoot osteotomy and fusion;  Surgeon: Norleen Armor, MD;  Location:  SURGERY CENTER;  Service: Orthopedics;  Laterality: Left;   CARDIAC CATHETERIZATION N/A 03/31/2016   Procedure: Left Heart Cath and Coronary Angiography;  Surgeon: Dorn JINNY Lesches, MD;  Location: [facility] INVASIVE CV LAB: 90% early mLAD, normal LV Fxn   CARDIAC CATHETERIZATION N/A 03/31/2016   Procedure: Coronary Stent Intervention;  Surgeon: Dorn JINNY Lesches, MD;  Location: [facility];  Service: Cardiovascular: PCI to mLAD BMS Vision 3.0 mm x 18 mm   CESAREAN SECTION  1997   CYSTOSCOPY N/A 11/13/2023   Procedure: CYSTOSCOPY;  Surgeon: Jeralyn Crutch, MD;  Location: [facility] OR;  Service: Gynecology;  Laterality: N/A;   CYSTOSCOPY WITH FULGERATION N/A 08/30/2022   Procedure: CYSTOSCOPY WITH FULGERATION, BIOPSY, LEFT RETROGRADE PYELOGRAM;  Surgeon: Elisabeth Valli BIRCH, MD;  Location: [facility]S;  Service: Urology;  Laterality: N/A;   DILITATION & CURRETTAGE/HYSTROSCOPY WITH NOVASURE ABLATION N/A 09/15/2020   Procedure: DILATATION & CURETTAGE/HYSTEROSCOPY;  Surgeon: Corene Coy, MD;  Location: [facility];  Service: Gynecology;  Laterality: N/A;   DILITATION & CURRETTAGE/HYSTROSCOPY WITH NOVASURE ABLATION N/A 12/21/2021   Procedure: DILATATION & CURETTAGE/HYSTEROSCOPY WITH NOVASURE ABLATION/ IUD REMOVAL;   Surgeon: Corene Coy, MD;  Location: [facility];  Service: Gynecology;  Laterality: N/A;   FISTULA SUPERFICIALIZATION Right 11/15/2017   Procedure: FISTULA SUPERFICIALIZATION RIGHT BRACHIOCEPHALIC  RIGHT;  Surgeon: laurence Redell CROME, MD;  Location: [facility] OR;  Service: Vascular;  Laterality: Right;   FOOT SURGERY  multiple for charcot   PERIPHERAL VASCULAR BALLOON ANGIOPLASTY Right 04/13/2021   Procedure: PERIPHERAL VASCULAR BALLOON ANGIOPLASTY;  Surgeon: Serene Gaile ORN, MD;  [facility]: [facility];  Service: Cardiovascular;  Laterality: Right;  arm fistula   TOTAL KNEE ARTHROPLASTY Left 07/19/2023   Procedure: TOTAL KNEE ARTHROPLASTY;  Surgeon: Fidel Rogue, MD;  Location: [facility];  Service: Orthopedics;  Laterality: Left;  150   TOTAL LAPAROSCOPIC HYSTERECTOMY WITH SALPINGECTOMY Bilateral 11/13/2023   Procedure: TOTAL LAPAROSCOPIC HYSTERECTOMY WITH SALPINGECTOMY;  Surgeon: Jeralyn Crutch, MD;  Location: [facility] OR;  Service: Gynecology;  Laterality: Bilateral;   TRANSTHORACIC ECHOCARDIOGRAM  02/2021   EF 55 to 60%.  No R WMA.  Mild septal LVH with moderate posterior hypertrophy.  Normal RV size and function.  Normal RVP/PASP.[name]  Trivial MR.   TUBAL LIGATION  1997    Family History  Problem Relation Age of Onset   Stroke Mother    Diabetes Mother    Hypertension Mother    Aneurysm Mother    Diabetes Father    Hypertension Father    Diabetes Sister    Hypertension Sister    Diabetes Maternal Grandmother    Diabetes Maternal Grandfather    Cancer Paternal Grandfather        Prostate    Social History   Tobacco Use   Smoking status: Former    Current packs/day: 0.00    Types: Cigarettes    Quit date: 02/28/2015    Years since quitting: 9.1   Smokeless tobacco: Never  Vaping Use   Vaping status: Never Used  Substance Use Topics   Alcohol use: Not Currently    Comment: ~ 1 a month   Drug use: Not Currently    Comment: last use 03/2021     ROS Refer to HPI for ROS details.  Objective:    Vitals: BP 133/77 (BP Location: Left Arm)   Pulse 94   Temp 98.4 F (36.9 C) (Oral)   Resp 16   LMP  (LMP Unknown)   SpO2 95%   Physical Exam Vitals and nursing note reviewed.  Constitutional:      General: She is not in acute distress.    Appearance: Normal appearance. She is well-developed. She is not ill-appearing or toxic-appearing.  HENT:     Head: Normocephalic and atraumatic.     Nose: Nose normal. No congestion or rhinorrhea.     Mouth/Throat:     Mouth: Mucous membranes are moist.   Cardiovascular:     Rate and Rhythm: Normal rate and regular rhythm.     Heart sounds: Murmur heard.  Pulmonary:     Effort: Pulmonary effort is normal. No respiratory distress.     Breath sounds: Normal breath sounds. No stridor. No wheezing, rhonchi or rales.  Chest:     Chest wall: No tenderness.   Skin:    General: Skin is warm and dry.   Neurological:     General: No focal deficit present.     Mental Status: She is alert and oriented to person, place, and time.   Psychiatric:        Mood and Affect: Mood normal.        Behavior: Behavior normal.     Procedures  Results for orders placed or performed during the hospital encounter of 05/08/24 (from the past 24 hours)  POCT urine pregnancy     Status: None   Collection Time: 05/08/24  5:21 PM  Result Value Ref Range  Preg Test, Ur Negative Negative   *Note: Due to a large number of results and/or encounters for the requested time period, some results have not been displayed. A complete set of results can be found in Results Review.    Assessment and Plan :     Discharge Instructions       1. Postviral syndrome (Primary) - DG Chest 2 View x-ray performed in UC shows no acute cardiopulmonary processes, no sign of consolidation or pneumonia. - albuterol  (PROVENTIL  HFA) 108 (90 Base) MCG/ACT inhaler; Inhale 2 puffs into the lungs every 4 (four) hours as  needed for coughing, wheezing, or shortness of breath.  Dispense: 17 g; Refill: 2 - promethazine -dextromethorphan  (PROMETHAZINE -DM) 6.25-15 MG/5ML syrup; Take 5 mLs by mouth 4 (four) times daily as needed for cough.  Dispense: 118 mL; Refill: 0 - azelastine (ASTELIN) 0.1 % nasal spray; Place 1 spray into both nostrils 2 (two) times daily. Use in each nostril as directed  Dispense: 30 mL; Refill: 1  2. Screening for STD (sexually transmitted disease) - Cervicovaginal swab collected in UC and sent to lab for further testing results should be available in 2 to 3 days - POCT urine pregnancy performed in UC is negative  -Continue to monitor symptoms for any change in severity if there is any escalation of current symptoms or development of new symptoms follow-up in ER for further evaluation and management.      Parmvir Boomer B Ireanna Finlayson   Jeaneen Cala B, [place] 05/08/24 1750

## 2024-05-08 NOTE — Discharge Instructions (Signed)
  1. Postviral syndrome (Primary) - DG Chest 2 View x-ray performed in UC shows no acute cardiopulmonary processes, no sign of consolidation or pneumonia. - albuterol  (PROVENTIL  HFA) 108 (90 Base) MCG/ACT inhaler; Inhale 2 puffs into the lungs every 4 (four) hours as needed for coughing, wheezing, or shortness of breath.  Dispense: 17 g; Refill: 2 - promethazine -dextromethorphan  (PROMETHAZINE -DM) 6.25-15 MG/5ML syrup; Take 5 mLs by mouth 4 (four) times daily as needed for cough.  Dispense: 118 mL; Refill: 0 - azelastine (ASTELIN) 0.1 % nasal spray; Place 1 spray into both nostrils 2 (two) times daily. Use in each nostril as directed  Dispense: 30 mL; Refill: 1  2. Screening for STD (sexually transmitted disease) - Cervicovaginal swab collected in UC and sent to lab for further testing results should be available in 2 to 3 days - POCT urine pregnancy performed in UC is negative  -Continue to monitor symptoms for any change in severity if there is any escalation of current symptoms or development of new symptoms follow-up in ER for further evaluation and management.

## 2024-05-08 NOTE — Telephone Encounter (Signed)
 Pt requested pharmacy be changed from [facility] to [facility] due to [facility] pharmacy closing

## 2024-05-09 LAB — CERVICOVAGINAL ANCILLARY ONLY
Bacterial Vaginitis (gardnerella): POSITIVE — AB
Candida Glabrata: NEGATIVE
Candida Vaginitis: NEGATIVE
Chlamydia: NEGATIVE
Comment: NEGATIVE
Comment: NEGATIVE
Comment: NEGATIVE
Comment: NEGATIVE
Comment: NEGATIVE
Comment: NORMAL
Neisseria Gonorrhea: NEGATIVE
Trichomonas: NEGATIVE

## 2024-05-13 ENCOUNTER — Other Ambulatory Visit (HOSPITAL_COMMUNITY): Payer: Self-pay

## 2024-05-13 ENCOUNTER — Ambulatory Visit (HOSPITAL_COMMUNITY): Payer: Self-pay

## 2024-05-13 MED ORDER — METRONIDAZOLE 500 MG PO TABS
500.0000 mg | ORAL_TABLET | Freq: Two times a day (BID) | ORAL | 0 refills | Status: AC
  Filled 2024-05-13 – 2024-05-23 (×2): qty 14, 7d supply, fill #0

## 2024-05-22 ENCOUNTER — Other Ambulatory Visit (HOSPITAL_COMMUNITY): Payer: Self-pay

## 2024-05-22 MED ORDER — OXYCODONE-ACETAMINOPHEN 10-325 MG PO TABS
1.0000 | ORAL_TABLET | Freq: Every day | ORAL | 0 refills | Status: AC | PRN
  Filled 2024-05-29: qty 150, 30d supply, fill #0

## 2024-05-23 ENCOUNTER — Other Ambulatory Visit (HOSPITAL_COMMUNITY): Payer: Self-pay

## 2024-05-29 ENCOUNTER — Other Ambulatory Visit (HOSPITAL_COMMUNITY): Payer: Self-pay

## 2024-05-30 ENCOUNTER — Encounter (HOSPITAL_COMMUNITY): Payer: Self-pay | Admitting: Vascular Surgery

## 2024-05-30 ENCOUNTER — Other Ambulatory Visit: Payer: Self-pay

## 2024-05-30 ENCOUNTER — Encounter (HOSPITAL_COMMUNITY)
Admission: RE | Disposition: A | Discharge status: Home / Self Care | Payer: Self-pay | Source: Home / Self Care | Attending: Vascular Surgery

## 2024-05-30 ENCOUNTER — Ambulatory Visit (HOSPITAL_COMMUNITY)
Admission: RE | Admit: 2024-05-30 | Discharge: 2024-05-30 | Disposition: A | Discharge status: Home / Self Care | Attending: Vascular Surgery | Admitting: Vascular Surgery

## 2024-05-30 DIAGNOSIS — N186 End stage renal disease: Secondary | ICD-10-CM | POA: Diagnosis not present

## 2024-05-30 DIAGNOSIS — Z992 Dependence on renal dialysis: Secondary | ICD-10-CM

## 2024-05-30 DIAGNOSIS — E1122 Type 2 diabetes mellitus with diabetic chronic kidney disease: Secondary | ICD-10-CM | POA: Diagnosis not present

## 2024-05-30 DIAGNOSIS — Z794 Long term (current) use of insulin: Secondary | ICD-10-CM | POA: Diagnosis not present

## 2024-05-30 DIAGNOSIS — Z5982 Transportation insecurity: Secondary | ICD-10-CM | POA: Insufficient documentation

## 2024-05-30 DIAGNOSIS — Z87891 Personal history of nicotine dependence: Secondary | ICD-10-CM | POA: Insufficient documentation

## 2024-05-30 DIAGNOSIS — T82898A Other specified complication of vascular prosthetic devices, implants and grafts, initial encounter: Secondary | ICD-10-CM

## 2024-05-30 DIAGNOSIS — I120 Hypertensive chronic kidney disease with stage 5 chronic kidney disease or end stage renal disease: Secondary | ICD-10-CM | POA: Diagnosis not present

## 2024-05-30 DIAGNOSIS — Y832 Surgical operation with anastomosis, bypass or graft as the cause of abnormal reaction of the patient, or of later complication, without mention of misadventure at the time of the procedure: Secondary | ICD-10-CM | POA: Diagnosis not present

## 2024-05-30 DIAGNOSIS — T82510A Breakdown (mechanical) of surgically created arteriovenous fistula, initial encounter: Secondary | ICD-10-CM | POA: Diagnosis present

## 2024-05-30 DIAGNOSIS — Z5986 Financial insecurity: Secondary | ICD-10-CM | POA: Insufficient documentation

## 2024-05-30 DIAGNOSIS — T82848A Pain from vascular prosthetic devices, implants and grafts, initial encounter: Secondary | ICD-10-CM

## 2024-05-30 HISTORY — PX: A/V SHUNT INTERVENTION: CATH118220

## 2024-05-30 LAB — GLUCOSE, CAPILLARY: Glucose-Capillary: 134 mg/dL — ABNORMAL HIGH (ref 70–99)

## 2024-05-30 SURGERY — A/V SHUNT INTERVENTION
Anesthesia: LOCAL | Site: Arm Upper | Laterality: Right

## 2024-05-30 MED ORDER — LIDOCAINE HCL (PF) 1 % IJ SOLN
INTRAMUSCULAR | Status: DC | PRN
  Administered 2024-05-30 (×3): 2 mL via SUBCUTANEOUS

## 2024-05-30 MED ORDER — HEPARIN (PORCINE) IN NACL 1000-0.9 UT/500ML-% IV SOLN
INTRAVENOUS | Status: DC | PRN
  Administered 2024-05-30: 500 mL

## 2024-05-30 MED ORDER — LIDOCAINE HCL (PF) 1 % IJ SOLN
INTRAMUSCULAR | Status: AC
  Filled 2024-05-30: qty 30

## 2024-05-30 MED ORDER — IODIXANOL 320 MG/ML IV SOLN
INTRAVENOUS | Status: DC | PRN
  Administered 2024-05-30: 30 mL via INTRAVENOUS

## 2024-05-30 SURGICAL SUPPLY — 10 items
CATH BEACON 5 .035 65 KMP TIP (CATHETERS) IMPLANT
DEVICE TORQUE SEADRAGON GRN (MISCELLANEOUS) IMPLANT
GUIDEWIRE ANGLED .035 180CM (WIRE) IMPLANT
SET MICROPUNCTURE 5F STIFF (MISCELLANEOUS) IMPLANT
SHEATH PINNACLE R/O II 6F 4CM (SHEATH) IMPLANT
SHEATH PROBE COVER 6X72 (BAG) IMPLANT
STOPCOCK MORSE 400PSI 3WAY (MISCELLANEOUS) IMPLANT
TRAY PV CATH (CUSTOM PROCEDURE TRAY) ×1 IMPLANT
TUBING CIL FLEX 10 FLL-RA (TUBING) IMPLANT
WIRE TORQFLEX AUST .018X40CM (WIRE) IMPLANT

## 2024-05-30 NOTE — H&P (Signed)
 H+P  History of Present Illness: This is a 48 y.o. female with a history of end-stage renal disease on dialysis via right upper extremity AV fistula originally created in 2018 and superficial lysed in 2019.  She does have a history of previous interventions of this fistula and believes that there are stents present.  More recently she has had pain in the fistula with difficulty in cannulation and increased pulsatility.  She is now here for fistulogram with possible intervention.  Past Medical History:  Diagnosis Date   Abnormal uterine bleeding (AUB)    Anxiety    Arthritis of knee    Left, Gel and cortisone injections @ [facility]   Asthma    prn inhaler   Bipolar disorder (HCC)    CAD S/P BMS PCI to prox LAD cardiologist--- dr anner Pais LAD to Mid LAD lesion, 90% stenosed. Post intervention - Vision BMS 3.0 mm x 18 mm (~3.5 mm) there is a 0% residual stenosis. 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   left 2018; right 2019   Depression    Diabetic foot ulcer (HCC) 04/22/2019   Diabetic neuropathy (HCC)    feet   Diabetic retinopathy, nonproliferative, severe (HCC)    bilateral   Dyspnea    with too much fluid   ESRD (end stage renal disease) on dialysis [facility])    ?chronic interstitial nephritis  ([facility]   H/O non-ST elevation myocardial infarction (NSTEMI) 03/2016   Found in 90% mid LAD lesion treated with bare-metal stent (BMS) PCI - vision BMS 3.0 mm x 18 mm   Heart murmur    History of blood transfusion    History of MRSA infection 2007   Hyperlipidemia    Hypertension    IDA (iron deficiency anemia)    Insulin  dependent type 2 diabetes mellitus (HCC)    Iron deficiency anemia    takes iron supplement   Myocardial infarction [name])    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   PAOD (peripheral arterial occlusive disease)  (HCC)    vascular--- dr laurence   Pneumonia 11/08/2021   PONV (postoperative nausea and vomiting)    S/P arteriovenous (AV) fistula creation 07/2017   S/p bare metal coronary artery stent 03/31/2016   BMS x1  to pLAD   Sickle cell trait (HCC)    Ulcer of left foot due to type 2 diabetes mellitus (HCC) 06/03/2019    Past Surgical History:  Procedure Laterality Date   A/V FISTULAGRAM N/A 04/13/2021   Procedure: A/V FISTULAGRAM - Right Arm;  Surgeon: Serene Gaile ORN, MD;  [facility]: [facility];  Service: Cardiovascular;  Laterality: N/A;   ACHILLES TENDON LENGTHENING Left 11/24/2016   Procedure: Left Achilles tendon lengthening (open);  Surgeon: Norleen Armor, MD;  Location: [facility];  Service: Orthopedics;  Laterality: Left;   AV FISTULA PLACEMENT Right 07/31/2017   Procedure: ARTERIOVENOUS BRACHIOCEPHALIC FISTULA CREATION RIGHT ARM;  Surgeon: laurence Redell CROME, MD;  Location: [facility] OR;  Service: Vascular;  Laterality: Right;   CALCANEAL OSTEOTOMY Left 11/24/2016   Procedure: Left hindfoot osteotomy and fusion;  Surgeon: Norleen Armor, MD;  Location:  SURGERY CENTER;  Service: Orthopedics;  Laterality: Left;   CARDIAC CATHETERIZATION N/A 03/31/2016   Procedure: Left Heart Cath and Coronary Angiography;  Surgeon: Dorn JINNY Lesches, MD;  Location: [facility] INVASIVE  CV LAB: 90% early mLAD, normal LV Fxn   CARDIAC CATHETERIZATION N/A 03/31/2016   Procedure: Coronary Stent Intervention;  Surgeon: Dorn JINNY Lesches, MD;  Location: [facility];  Service: Cardiovascular: PCI to mLAD BMS Vision 3.0 mm x 18 mm   CESAREAN SECTION  1997   CYSTOSCOPY N/A 11/13/2023   Procedure: CYSTOSCOPY;  Surgeon: Jeralyn Crutch, MD;  Location: [facility] OR;  Service: Gynecology;  Laterality: N/A;   CYSTOSCOPY WITH FULGERATION N/A 08/30/2022   Procedure: CYSTOSCOPY WITH FULGERATION, BIOPSY, LEFT RETROGRADE PYELOGRAM;  Surgeon: Elisabeth Valli BIRCH, MD;  Location: [facility];  Service: Urology;  Laterality:  N/A;   DILITATION & CURRETTAGE/HYSTROSCOPY WITH NOVASURE ABLATION N/A 09/15/2020   Procedure: DILATATION & CURETTAGE/HYSTEROSCOPY;  Surgeon: Corene Coy, MD;  Location: [facility];  Service: Gynecology;  Laterality: N/A;   DILITATION & CURRETTAGE/HYSTROSCOPY WITH NOVASURE ABLATION N/A 12/21/2021   Procedure: DILATATION & CURETTAGE/HYSTEROSCOPY WITH NOVASURE ABLATION/ IUD REMOVAL;  Surgeon: Corene Coy, MD;  Location: [facility] [place];  Service: Gynecology;  Laterality: N/A;   FISTULA SUPERFICIALIZATION Right 11/15/2017   Procedure: FISTULA SUPERFICIALIZATION RIGHT BRACHIOCEPHALIC  RIGHT;  Surgeon: Laurence Redell CROME, MD;  Location: [facility] OR;  Service: Vascular;  Laterality: Right;   FOOT SURGERY     multiple for charcot   PERIPHERAL VASCULAR BALLOON ANGIOPLASTY Right 04/13/2021   Procedure: PERIPHERAL VASCULAR BALLOON ANGIOPLASTY;  Surgeon: Serene Gaile ORN, MD;  [facility]: [facility];  Service: Cardiovascular;  Laterality: Right;  arm fistula   TOTAL KNEE ARTHROPLASTY Left 07/19/2023   Procedure: TOTAL KNEE ARTHROPLASTY;  Surgeon: Fidel Redell, MD;  Location: [facility];  Service: Orthopedics;  Laterality: Left;  150   TOTAL LAPAROSCOPIC HYSTERECTOMY WITH SALPINGECTOMY Bilateral 11/13/2023   Procedure: TOTAL LAPAROSCOPIC HYSTERECTOMY WITH SALPINGECTOMY;  Surgeon: Jeralyn Crutch, MD;  Location: [facility];  Service: Gynecology;  Laterality: Bilateral;   TRANSTHORACIC ECHOCARDIOGRAM  02/2021   EF 55 to 60%.  No R WMA.  Mild septal LVH with moderate posterior hypertrophy.  Normal RV size and function.  Normal RVP/PASP.[name]  Trivial MR.   TUBAL LIGATION  1997    Allergies  Allergen Reactions   Adhesive [Tape] Other (See Comments)    Irritation     Prior to Admission medications   Medication Sig Start Date End Date Taking? Authorizing Provider  acetaminophen  (TYLENOL ) 500 MG tablet Take 2 tablets (1,000 mg total) by mouth every 8 (eight) hours as needed  for moderate pain, mild pain, fever or headache. 07/26/23   Hill, Valery RAMAN, PA-C  albuterol  (PROVENTIL  HFA) 108 (90 Base) MCG/ACT inhaler Inhale 2 puffs into the lungs every 4 (four) hours as needed for coughing, wheezing, or shortness of breath. 05/08/24   Reddick, Johnathan B, NP  alum & mag hydroxide-simeth (MAALOX/MYLANTA) 200-200-20 MG/5ML suspension Take 15 mLs by mouth every 4 (four) hours as needed for indigestion or heartburn. 11/16/23   Ajewole, Christana, MD  azelastine  (ASTELIN ) 0.1 % nasal spray Place 1 spray into both nostrils 2 (two) times daily. Use in each nostril as directed 05/08/24   Reddick, Johnathan B, NP  benzonatate  (TESSALON ) 100 MG capsule Take 1 capsule (100 mg total) by mouth 3 (three) times daily as needed for cough. 01/01/24   Gregary Sharper, MD  cetirizine  (ZYRTEC ) 10 MG tablet Take 10 mg by mouth daily.    [provider]  Continuous Glucose Sensor (DEXCOM G7 SENSOR) MISC Use it for 10 days to continuously monitor your blood sugars. 06/26/23   Kandis Perkins,  DO  dicyclomine  (BENTYL ) 10 MG capsule Take 1 capsule (10 mg total) by mouth 3 (three) times daily before meals. 03/13/23   Sridharan, Sriramkumar, MD  DULoxetine  (CYMBALTA ) 30 MG capsule Take 1 capsule (30 mg total) by mouth daily. 06/26/23 06/25/24  Kandis Perkins, DO  gabapentin  (NEURONTIN ) 100 MG capsule Take 1 capsule (100 mg total) by mouth at bedtime. 03/03/22   Golda Lynwood PARAS, MD  icosapent  Ethyl (VASCEPA ) 1 g capsule Take 2 capsules (2 g total) by mouth 2 (two) times daily. 03/29/24   Anner Alm ORN, MD  injection device for insulin  (INPEN 100-PINK-LILLY-HUMALOG ) DEVI Use to inject Humalog  insulin  for meals and correction insulin  03/15/23   Sridharan, Sriramkumar, MD  insulin  degludec (TRESIBA ) 100 UNIT/ML FlexTouch Pen Inject 24 Units into the skin at bedtime. 03/13/23   Sridharan, Sriramkumar, MD  insulin  lispro (HUMALOG ) 100 UNIT/ML cartridge Inject 6-20 Units total into the skin 3 (three) times daily with  meals. Use with inpen to inject at meal time and for correction insulin  up to 40 units daily 11/28/23   Harrie Bruckner, DO  lanthanum  (FOSRENOL ) 1000 MG chewable tablet Chew 1 tablet (1,000 mg total) by mouth 3 (three) times daily with meals. 08/11/21     leflunomide  (ARAVA ) 10 MG tablet Take 1 tablet (10 mg total) by mouth daily. 09/14/23     leflunomide  (ARAVA ) 10 MG tablet Take 1 tablet (10 mg total) by mouth daily. 09/18/23     methocarbamol  (ROBAXIN ) 500 MG tablet Take 1 tablet (500 mg total) by mouth every 6 (six) hours as needed for muscle spasms. 08/07/23     metoCLOPramide  (REGLAN ) 10 MG tablet Take 1 tablet by mouth once a day as needed 10/04/23     metoCLOPramide  (REGLAN ) 5 MG tablet Take 1 tablet (5 mg total) by mouth every 6 (six) hours as needed. 11/16/23   Ajewole, Christana, MD  metoprolol  tartrate (LOPRESSOR ) 25 MG tablet Take 1 tablet (25 mg total) by mouth 2 (two) times daily. Do not take the night before dialysis, and take 2 hours after dialysis. 03/29/24   Anner Alm ORN, MD  metroNIDAZOLE  (FLAGYL ) 500 MG tablet Take 1 tablet (500 mg total) by mouth 2 (two) times daily for 7 days. 05/13/24 05/31/24  Banister, Pamela K, MD  Multiple Vitamins-Minerals (HAIR SKIN & NAILS) TABS Take 1 tablet by mouth daily.    [provider]  multivitamin (RENA-VIT) TABS tablet Take 1 tablet by mouth daily. 05/13/22   Rosan Dayton BROCKS, DO  naloxone  (NARCAN ) nasal spray 4 mg/0.1 mL Place 1 spray into the nose as needed if found unresponsive and call 911 immediately. 01/26/24     omeprazole  (PRILOSEC) 20 MG capsule Take 1 capsule (20 mg total) by mouth daily. 11/17/23   Ajewole, Christana, MD  oxyCODONE -acetaminophen  (PERCOCET) 10-325 MG tablet Take 1 tablet by mouth 5 (five) times daily as needed. 03/26/24     oxyCODONE -acetaminophen  (PERCOCET) 10-325 MG tablet Take 1 tablet by mouth 5 (five) times daily as needed. 05/22/24     polyethylene glycol (MIRALAX  / GLYCOLAX ) 17 g packet Take 17 g by mouth  daily as needed for moderate constipation.    [provider]  prochlorperazine  (COMPAZINE ) 10 MG tablet Take 1 tablet (10 mg total) by mouth every 6 (six) hours as needed for nausea or vomiting. 11/16/23   Ajewole, Christana, MD  promethazine  (PHENERGAN ) 12.5 MG tablet Take 1 tablet (12.5 mg total) by mouth every 6 (six) hours as needed for nausea or vomiting. 11/13/23  Ajewole, Christana, MD  promethazine -dextromethorphan  (PROMETHAZINE -DM) 6.25-15 MG/5ML syrup Take 5 mLs by mouth 4 (four) times daily as needed for cough. 05/08/24   Reddick, Johnathan B, NP  rosuvastatin  (CRESTOR ) 40 MG tablet Take 1 tablet (40 mg total) by mouth daily. 03/29/24   Anner Alm ORN, MD  sevelamer  carbonate (RENVELA ) 800 MG tablet Take 1,600-3,200 mg by mouth See admin instructions. Take 3200 mg with each meal and 1600 mg with each snack    [provider]  Continuous Blood Gluc Transmit (DEXCOM G6 TRANSMITTER) MISC Use to check blood sugar at least 6 times a day 03/03/22 03/13/23  Golda Lynwood PARAS, MD    Social History   Socioeconomic History   Marital status: Single    Spouse name: Not on file   Number of children: 2   Years of education: 31   Highest education level: Not on file  Occupational History   Occupation:   Network engineer: [employer]  Tobacco Use   Smoking status: Former    Current packs/day: 0.00    Types: Cigarettes    Quit date: 02/28/2015    Years since quitting: 9.2   Smokeless tobacco: Never  Vaping Use   Vaping status: Never Used  Substance and Sexual Activity   Alcohol use: Not Currently    Comment: ~ 1 a month   Drug use: Not Currently    Comment: last use 05/01/2021   Sexual activity: Yes    Birth control/protection: Surgical  Other Topics Concern   Not on file  Social History Narrative   Patient does not drink caffeine.   Patient is right handed.       --> 2020 was a difficult year: Her sister died in 01-May-2024 (potentially because of COVID-19), her recent  ex-boyfriend died in either 04-01-2024 or 2024-05-01, this was followed by one of her close friends being murdered by the boyfriend.    --> Good news for 2020 was that she had a new grandkids.   Social Drivers of Corporate investment banker Strain: High Risk (06/26/2023)   Overall Financial Resource Strain (CARDIA)    Difficulty of Paying Living Expenses: Very hard  Food Insecurity: No Food Insecurity (11/14/2023)   Hunger Vital Sign    Worried About Running Out of Food in the Last Year: Never true    Ran Out of Food in the Last Year: Never true  Transportation Needs: Unmet Transportation Needs (11/15/2023)   PRAPARE - Transportation    Lack of Transportation (Medical): Yes    Lack of Transportation (Non-Medical): Yes  Physical Activity: Sufficiently Active (06/26/2023)   Exercise Vital Sign    Days of Exercise per Week: 4 days    Minutes of Exercise per Session: 40 min  Stress: Stress Concern Present (06/26/2023)   [employer] of Occupational Health - Occupational Stress Questionnaire    Feeling of Stress : Very much  Social Connections: Moderately Isolated (06/26/2023)   Social Connection and Isolation Panel    Frequency of Communication with Friends and Family: More than three times a week    Frequency of Social Gatherings with Friends and Family: More than three times a week    Attends Religious Services: 1 to 4 times per year    Active Member of [employer] or Organizations: No    Attends Banker Meetings: Never    Marital Status: Divorced  Catering manager Violence: Not At Risk (11/14/2023)   Humiliation, Afraid, Rape, and Kick questionnaire  Fear of Current or Ex-Partner: No    Emotionally Abused: No    Physically Abused: No    Sexually Abused: No    Family History  Problem Relation Age of Onset   Stroke Mother    Diabetes Mother    Hypertension Mother    Aneurysm Mother    Diabetes Father    Hypertension Father    Diabetes Sister    Hypertension Sister     Diabetes Maternal Grandmother    Diabetes Maternal Grandfather    Cancer Paternal Grandfather        Prostate    ROS: Pain in right upper extremity fistula  Physical Examination  Vitals:   05/30/24 0820  BP: (!) 186/105  Pulse: 92  Resp: 12  Temp: 98.4 F (36.9 C)  SpO2: 100%   There is no height or weight on file to calculate BMI.  Awake alert and oriented Nonlabored respirations Right upper arm AV fistula with 2 areas of pseudoaneurysmal degeneration that are very pulsatile and there is pulsatility more cephalad to the pseudoaneurysms as well  CBC    Component Value Date/Time   WBC 5.4 12/25/2023 1824   RBC 5.13 (H) 12/25/2023 1824   HGB 13.1 12/25/2023 1824   HGB 11.4 01/29/2020 1437   HCT 41.7 12/25/2023 1824   HCT 36.6 01/29/2020 1437   PLT 194 12/25/2023 1824   PLT 229 01/29/2020 1437   MCV 81.3 12/25/2023 1824   MCV 85 01/29/2020 1437   MCH 25.5 (L) 12/25/2023 1824   MCHC 31.4 12/25/2023 1824   RDW 18.0 (H) 12/25/2023 1824   RDW 14.3 01/29/2020 1437   LYMPHSABS 1.1 07/26/2023 0655   MONOABS 0.6 07/26/2023 0655   EOSABS 0.2 07/26/2023 0655   BASOSABS 0.0 07/26/2023 0655    BMET    Component Value Date/Time   NA 134 (L) 12/25/2023 1824   NA 142 12/21/2017 1526   NA 144 09/20/2017 0000   K 4.3 12/25/2023 1824   K 5.4 09/20/2017 0000   CL 93 (L) 12/25/2023 1824   CL 112 09/20/2017 0000   CO2 25 12/25/2023 1824   CO2 22 09/20/2017 0000   GLUCOSE 112 (H) 12/25/2023 1824   BUN 19 12/25/2023 1824   BUN 69 (H) 12/21/2017 1526   CREATININE 8.20 (H) 12/25/2023 1824   CREATININE 3.40 (H) 03/27/2017 0759   CALCIUM  9.0 12/25/2023 1824   CALCIUM  8.6 (L) 11/24/2017 1420   GFRNONAA 6 (L) 12/25/2023 1824   GFRNONAA 9 (L) 09/20/2017 0000   GFRNONAA 65 06/09/2015 1211   GFRAA 4 (L) 11/29/2018 1129   GFRAA 10 (L) 09/20/2017 0000   GFRAA 75 06/09/2015 1211    COAGS: Lab Results  Component Value Date   INR 1.13 07/26/2017   INR 1.07 03/31/2016   INR 1.0  01/11/2009      ASSESSMENT/PLAN: This is a 48 y.o. female with end-stage renal disease on dialysis via right upper arm cephalic vein AV fistula with recent malfunction and pain.  Plan is for fistulogram today with possible intervention.  We discussed the risk and benefits and she demonstrates good understanding.  Jane [name] C. Sheree, MD [facility] Office: [phone] Pager: [phone]

## 2024-05-30 NOTE — Op Note (Signed)
    Patient name: Jane [name] MRN: [id number] DOB: 1976-04-20 Sex: female  05/30/2024 Pre-operative Diagnosis: End-stage renal disease, malfunction right arm AV fistula Post-operative diagnosis:  Same Surgeon:  Penne BROCKS. Sheree, MD Procedure Performed: 1.  Ultrasound-guided cannulation right arm AV fistula 2.  Right upper extremity fistulogram  Indications: 48 year old female with history of end-stage renal disease dialyzing via right upper arm two-stage cephalic vein fistula.  She has had previous percutaneous intervention which I do not have record of.  She is having difficulty with cannulation and dialysis and is not a candidate for fistulogram with possible intervention.  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  There is a cephalic arch is occluded which appears to have been occluded for significant time.  There are collaterals filling the central system.  The axillary, subclavian, innominate and superior vena cava veins are all patent centrally.   I was unable to cross the occluded cephalic arch after cannulating higher in the upper arm.    Fistula is not amenable to further percutaneous intervention.  If she has continued issues with dialysis she would need consideration of conversion to upper extremity graft versus cephalic vein turndown however the fistula is quite diseased with 2 very large pseudoaneurysms that are calcified.   Procedure:  The patient was identified in the holding area and taken to heart and vascular procedure room.  The patient was then placed supine on the table and prepped and draped in the usual sterile fashion.  A time out was called.  Ultrasound was used to evaluate the the fistula near the antecubitum initially in the area was anesthetized with 1% lidocaine  and I cannulated this with direct ultrasound visualization using a micropuncture needle followed by wire and  sheath.  Unfortunately due to the heavy calcification the sheath appeared to balance outside of calcium  I performed initial fistulogram and this was decided the outside of the fistula however there was no active extravasation.  I then remove the sheath and held pressure until hemostasis was obtained.  I then directly cannulated the very large pseudoaneurysm again with a micropuncture needle followed by wire sheath.  Fistulogram this time demonstrated a large pseudoaneurysm but the true luminal channel was actually more peripheral than where I did cannulated.  This I did place a Glidewire followed by a 6 [place] sheath and attempted to use a Kumpe catheter to steer around the pseudoaneurysm but was unable to do so.  At this time I suture-ligated the cannulation site.  I then directly cannulated higher up in the upper arm using a micropuncture needle followed by wire and sheath and again I anesthetized the skin in this area.  I then performed fistulogram which demonstrated direct flow into an occluded cephalic arch with then flow around this via multiple collaterals filling the central system.  I again placed the Glidewire followed by a 6 French sheath and used a Kumpe catheter and Glidewire and attempted to cross the occluded cephalic arch but again was unable to cross.  Fistula remains pulsatile and okay for use but I think will ultimately require surgical intervention.  I removed the sheath and suture-ligated the cannulation site.  She tolerated the procedure without any complication.  Contrast: 30cc    Kalyn Hofstra C. Sheree, MD [facility] Office: [phone] Pager: [phone]

## 2024-06-14 ENCOUNTER — Other Ambulatory Visit: Payer: Self-pay | Admitting: Vascular Surgery

## 2024-06-14 DIAGNOSIS — N186 End stage renal disease: Secondary | ICD-10-CM

## 2024-06-21 ENCOUNTER — Other Ambulatory Visit (HOSPITAL_COMMUNITY): Payer: Self-pay

## 2024-06-21 MED ORDER — OXYCODONE-ACETAMINOPHEN 10-325 MG PO TABS
1.0000 | ORAL_TABLET | Freq: Every day | ORAL | 0 refills | Status: AC | PRN
  Filled 2024-06-28: qty 150, 30d supply, fill #0

## 2024-06-27 ENCOUNTER — Ambulatory Visit
Admission: EM | Admit: 2024-06-27 | Discharge: 2024-06-27 | Disposition: A | Discharge status: Home / Self Care | Attending: Physician Assistant | Admitting: Physician Assistant

## 2024-06-27 ENCOUNTER — Other Ambulatory Visit: Payer: Self-pay

## 2024-06-27 DIAGNOSIS — R102 Pelvic and perineal pain: Secondary | ICD-10-CM | POA: Diagnosis not present

## 2024-06-27 DIAGNOSIS — N941 Unspecified dyspareunia: Secondary | ICD-10-CM | POA: Insufficient documentation

## 2024-06-27 DIAGNOSIS — Z113 Encounter for screening for infections with a predominantly sexual mode of transmission: Secondary | ICD-10-CM | POA: Diagnosis present

## 2024-06-27 DIAGNOSIS — N949 Unspecified condition associated with female genital organs and menstrual cycle: Secondary | ICD-10-CM | POA: Diagnosis present

## 2024-06-27 DIAGNOSIS — Z114 Encounter for screening for human immunodeficiency virus [HIV]: Secondary | ICD-10-CM | POA: Insufficient documentation

## 2024-06-27 DIAGNOSIS — Z87891 Personal history of nicotine dependence: Secondary | ICD-10-CM | POA: Diagnosis not present

## 2024-06-27 NOTE — ED Triage Notes (Signed)
 Pt presents to urgent care for STD testing. States her sex partner is having sexual relations with another person. Pt states the only symptom she is experiencing is vaginal discomfort following intercourse. Currently denies pain. Pt is requesting vaginal swab + blood work today.

## 2024-06-27 NOTE — ED Provider Notes (Signed)
 [facility]    CSN: [id number] Arrival date & time: 06/27/24  1515      History   Chief Complaint Chief Complaint  Patient presents with   SEXUALLY TRANSMITTED DISEASE    HPI Jane [name] is a 48 y.o. female.  has a past medical history of Abnormal uterine bleeding (AUB), Anxiety, Arthritis of knee, Asthma, Bipolar disorder (HCC), CAD S/P BMS PCI to prox LAD (cardiologist--- dr anner), Charcot foot due to diabetes mellitus (HCC) (11/2016), Depression, Diabetic foot ulcer (HCC) (04/22/2019), Diabetic neuropathy (HCC), Diabetic retinopathy, nonproliferative, severe (HCC), Dyspnea, ESRD (end stage renal disease) on dialysis [facility]), H/O non-ST elevation myocardial infarction (NSTEMI) (03/2016), Heart murmur, History of blood transfusion, History of MRSA infection (2007), Hyperlipidemia, Hypertension, IDA (iron deficiency anemia), Insulin dependent type 2 diabetes mellitus (HCC), Iron deficiency anemia, Myocardial infarction (HCC), 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   HPI  Pt states she has been sexually active with her female partner for about 15 years and states he recently moved and has been sexually active with another woman She states she has been getting recurrent vulvovaginal irritation and feels like this going on now She reports most recent sexual intercourse with this partner was Thurs. She states on Friday he was vomiting and her partner asked if she has a STD that she transmitted. He told her that he had a UTI but she would like STD screening today for full peace of mind  Pt had total hysterectomy in Dec 2024.  She is on dialysis and does not generally produce urine.   Past Medical History:  Diagnosis Date   Abnormal uterine bleeding (AUB)    Anxiety    Arthritis of knee    Left, Gel and cortisone injections @ [facility]   Asthma    prn inhaler   Bipolar disorder (HCC)    CAD S/P BMS PCI to prox LAD cardiologist--- dr anner Pais LAD to Mid LAD lesion, 90% stenosed. Post intervention - Vision BMS 3.0 mm x 18 mm (~3.5 mm) there is a 0% residual stenosis. 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   left 2018; right 2019   Depression    Diabetic foot ulcer (HCC) 04/22/2019   Diabetic neuropathy (HCC)    feet   Diabetic retinopathy, nonproliferative, severe (HCC)    bilateral   Dyspnea    with too much fluid   ESRD (end stage renal disease) on dialysis [facility])    ?chronic interstitial nephritis  ([facility]   H/O non-ST elevation myocardial infarction (NSTEMI) 03/2016   Found in 90% mid LAD lesion treated with bare-metal stent (BMS) PCI - vision BMS 3.0 mm x 18 mm   Heart murmur    History of blood transfusion    History of MRSA infection 2007   Hyperlipidemia    Hypertension    IDA (iron deficiency anemia)    Insulin dependent type 2 diabetes mellitus (HCC)    Iron deficiency anemia    takes iron supplement   Myocardial infarction [facility])    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  PAOD (peripheral arterial occlusive disease) (HCC)    vascular--- dr laurence   Pneumonia 11/08/2021   PONV (postoperative nausea and vomiting)    S/P arteriovenous (AV) fistula creation 07/2017   S/p bare metal coronary artery stent 03/31/2016   BMS x1  to pLAD   Sickle cell trait (HCC)    Ulcer of left foot due to type 2 diabetes mellitus (HCC) 06/03/2019    Patient Active Problem List   Diagnosis Date Noted   Intramural leiomyoma of uterus 11/13/2023    Endometrial stripe increased 11/13/2023   Encounter for perioperative consultation 06/26/2023   Abnormal hematology test result 04/12/2023   Hematuria 05/26/2022   Irritable bowel syndrome (IBS) 06/03/2021   Diabetic neuropathy (HCC) 01/29/2021   Hypertensive heart disease with chronic diastolic congestive heart failure (HCC) 01/06/2021   Osteoarthritis of left knee 02/11/2019   Nausea in adult 11/30/2018   Hidradenitis suppurativa 02/12/2018   Anemia in chronic kidney disease 02/12/2018   Secondary hyperparathyroidism of renal origin (HCC) 01/30/2018   Major depressive disorder 08/11/2017   Sinus tachycardia 02/23/2017   ESRD (end stage renal disease) on dialysis (HCC) 01/05/2017   CAD S/P BMS PCI to prox LAD 03/31/2016   Abnormal uterine bleeding (AUB) 03/17/2016   PAOD (peripheral arterial occlusive disease) (HCC) 01/08/2016   Charcot foot due to diabetes mellitus (HCC) 09/07/2015   Essential hypertension 08/16/2014   Seasonal allergic rhinitis 08/16/2014   Viral upper respiratory illness 08/05/2014   Morbid obesity (HCC) 09/26/2012   Hyperlipidemia associated with type 2 diabetes mellitus (HCC) 02/26/2009   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, with long-term current use of insulin (HCC) 05/08/2007   Asthma 05/08/2007    Past Surgical History:  Procedure Laterality Date   A/V FISTULAGRAM N/A 04/13/2021   Procedure: A/V FISTULAGRAM - Right Arm;  Surgeon: Serene Gaile ORN, MD;  Location: [facility];  Service: Cardiovascular;  Laterality: N/A;   A/V SHUNT INTERVENTION Right 05/30/2024   Procedure: A/V SHUNT INTERVENTION;  Surgeon: Sheree Penne Bruckner, MD;  Location: [facility];  Service: Cardiovascular;  Laterality: Right;   ACHILLES TENDON LENGTHENING Left 11/24/2016   Procedure: Left Achilles tendon lengthening (open);  Surgeon: Norleen Armor, MD;  Location: [facility];  Service: Orthopedics;  Laterality: Left;   AV  FISTULA PLACEMENT Right 07/31/2017   Procedure: ARTERIOVENOUS BRACHIOCEPHALIC FISTULA CREATION RIGHT ARM;  Surgeon: laurence Redell CROME, MD;  Location: [facility] OR;  Service: Vascular;  Laterality: Right;   CALCANEAL OSTEOTOMY Left 11/24/2016   Procedure: Left hindfoot osteotomy and fusion;  Surgeon: Norleen Armor, MD;  Location: [facility];  Service: Orthopedics;  Laterality: Left;   CARDIAC CATHETERIZATION N/A 03/31/2016   Procedure: Left Heart Cath and Coronary Angiography;  Surgeon: Dorn JINNY Lesches, MD;  Location: [facility] IN[facility] CV LAB: 90% early mLAD, normal LV Fxn   CARDIAC CATHETERIZATION N/A 03/31/2016   Procedure: Coronary Stent Intervention;  Surgeon: Dorn JINNY Lesches, MD;  Location: [facility];  Service: Cardiovascular: PCI to mLAD BMS Vision 3.0 mm x 18 mm   CESAREAN SECTION  1997   CYSTOSCOPY N/A 11/13/2023   Procedure: CYSTOSCOPY;  Surgeon: Jeralyn Crutch, MD;  Location: [facility];  Service: Gynecology;  Laterality: N/A;   CYSTOSCOPY WITH FULGERATION N/A 08/30/2022   Procedure: CYSTOSCOPY WITH FULGERATION, BIOPSY, LEFT RETROGRADE PYELOGRAM;  Surgeon: Elisabeth Valli BIRCH, MD;  Location: [facility];  Service: Urology;  Laterality: N/A;   DILITATION & CURRETTAGE/HYSTROSCOPY WITH NOVASURE ABLATION N/A  09/15/2020   Procedure: DILATATION & CURETTAGE/HYSTEROSCOPY;  Surgeon: Corene Coy, MD;  Location: [facility];  Service: Gynecology;  Laterality: N/A;   DILITATION & CURRETTAGE/HYSTROSCOPY WITH NOVASURE ABLATION N/A 12/21/2021   Procedure: DILATATION & CURETTAGE/HYSTEROSCOPY WITH NOVASURE ABLATION/ IUD REMOVAL;  Surgeon: Corene Coy, MD;  Location: [facility];  Service: Gynecology;  Laterality: N/A;   FISTULA SUPERFICIALIZATION Right 11/15/2017   Procedure: FISTULA SUPERFICIALIZATION RIGHT BRACHIOCEPHALIC  RIGHT;  Surgeon: Laurence Redell CROME, MD;  Location: [facility] OR;  Service: Vascular;  Laterality: Right;   FOOT SURGERY     multiple for  charcot   PERIPHERAL VASCULAR BALLOON ANGIOPLASTY Right 04/13/2021   Procedure: PERIPHERAL VASCULAR BALLOON ANGIOPLASTY;  Surgeon: Serene Gaile ORN, MD;  Location: [facility];  Service: Cardiovascular;  Laterality: Right;  arm fistula   TOTAL KNEE ARTHROPLASTY Left 07/19/2023   Procedure: TOTAL KNEE ARTHROPLASTY;  Surgeon: Fidel Redell, MD;  Location: [facility];  Service: Orthopedics;  Laterality: Left;  150   TOTAL LAPAROSCOPIC HYSTERECTOMY WITH SALPINGECTOMY Bilateral 11/13/2023   Procedure: TOTAL LAPAROSCOPIC HYSTERECTOMY WITH SALPINGECTOMY;  Surgeon: Jeralyn Crutch, MD;  Location: [facility];  Service: Gynecology;  Laterality: Bilateral;   TRANSTHORACIC ECHOCARDIOGRAM  02/2021   EF 55 to 60%.  No R WMA.  Mild septal LVH with moderate posterior hypertrophy.  Normal RV size and function.  Normal RVP/PASP.[name]  Trivial MR.   TUBAL LIGATION  1997    OB History     Gravida  2   Para  2   Term  1   Preterm  1   AB  0   Living  2      SAB  0   IAB  0   Ectopic  0   Multiple  0   Live Births               Home Medications    Prior to Admission medications   Medication Sig Start Date End Date Taking? Authorizing Provider  acetaminophen (TYLENOL) 500 MG tablet Take 2 tablets (1,000 mg total) by mouth every 8 (eight) hours as needed for moderate pain, mild pain, fever or headache. 07/26/23   Hill, Valery RAMAN, PA-C  albuterol (PROVENTIL HFA) 108 (90 Base) MCG/ACT inhaler Inhale 2 puffs into the lungs every 4 (four) hours as needed for coughing, wheezing, or shortness of breath. 05/08/24   Reddick, Johnathan B, NP  alum & mag hydroxide-simeth (MAALOX/MYLANTA) 200-200-20 MG/5ML suspension Take 15 mLs by mouth every 4 (four) hours as needed for indigestion or heartburn. 11/16/23   Ajewole, Christana, MD  azelastine (ASTELIN) 0.1 % nasal spray Place 1 spray into both nostrils 2 (two) times daily. Use in each nostril as directed 05/08/24   Reddick, Johnathan B, NP  benzonatate  (TESSALON) 100 MG capsule Take 1 capsule (100 mg total) by mouth 3 (three) times daily as needed for cough. 01/01/24   Gregary Sharper, MD  cetirizine (ZYRTEC) 10 MG tablet Take 10 mg by mouth daily.    [provider]  Continuous Glucose Sensor (DEXCOM G7 SENSOR) MISC Use it for 10 days to continuously monitor your blood sugars. 06/26/23   Kandis Perkins, DO  dicyclomine (BENTYL) 10 MG capsule Take 1 capsule (10 mg total) by mouth 3 (three) times daily before meals. 03/13/23   Sridharan, Sriramkumar, MD  DULoxetine (CYMBALTA) 30 MG capsule Take 1 capsule (30 mg total) by mouth daily. 06/26/23 06/25/24  Kandis Perkins, DO  gabapentin (NEURONTIN) 100 MG capsule Take 1  capsule (100 mg total) by mouth at bedtime. 03/03/22   Golda Lynwood PARAS, MD  icosapent Ethyl (VASCEPA) 1 g capsule Take 2 capsules (2 g total) by mouth 2 (two) times daily. 03/29/24   Anner Alm ORN, MD  injection device for insulin (INPEN 100-PINK-LILLY-HUMALOG) DEVI Use to inject Humalog insulin for meals and correction insulin 03/15/23   Sridharan, Sriramkumar, MD  insulin degludec (TRESIBA) 100 UNIT/ML FlexTouch Pen Inject 24 Units into the skin at bedtime. 03/13/23   Synthia Merchant, MD  insulin lispro (HUMALOG) 100 UNIT/ML cartridge Inject 6-20 Units total into the skin 3 (three) times daily with meals. Use with inpen to inject at meal time and for correction insulin up to 40 units daily 11/28/23   Harrie Bruckner, DO  lanthanum (FOSRENOL) 1000 MG chewable tablet Chew 1 tablet (1,000 mg total) by mouth 3 (three) times daily with meals. 08/11/21     leflunomide (ARAVA) 10 MG tablet Take 1 tablet (10 mg total) by mouth daily. 09/14/23     leflunomide (ARAVA) 10 MG tablet Take 1 tablet (10 mg total) by mouth daily. 09/18/23     methocarbamol (ROBAXIN) 500 MG tablet Take 1 tablet (500 mg total) by mouth every 6 (six) hours as needed for muscle spasms. 08/07/23     metoCLOPramide (REGLAN) 10 MG tablet Take 1 tablet by mouth once a day  as needed 10/04/23     metoCLOPramide (REGLAN) 5 MG tablet Take 1 tablet (5 mg total) by mouth every 6 (six) hours as needed. 11/16/23   Ajewole, Christana, MD  metoprolol tartrate (LOPRESSOR) 25 MG tablet Take 1 tablet (25 mg total) by mouth 2 (two) times daily. Do not take the night before dialysis, and take 2 hours after dialysis. 03/29/24   Anner Alm ORN, MD  Multiple Vitamins-Minerals (HAIR SKIN & NAILS) TABS Take 1 tablet by mouth daily.    [provider]  multivitamin (RENA-VIT) TABS tablet Take 1 tablet by mouth daily. 05/13/22   Rosan Dayton BROCKS, DO  naloxone [facility]) nasal spray 4 mg/0.1 mL Place 1 spray into the nose as needed if found unresponsive and call 911 immediately. 01/26/24     omeprazole (PRILOSEC) 20 MG capsule Take 1 capsule (20 mg total) by mouth daily. 11/17/23   Ajewole, Christana, MD  oxyCODONE-acetaminophen (PERCOCET) 10-325 MG tablet Take 1 tablet by mouth 5 (five) times daily as needed. 03/26/24     oxyCODONE-acetaminophen (PERCOCET) 10-325 MG tablet Take 1 tablet by mouth 5 (five) times daily as needed. 05/22/24     oxyCODONE-acetaminophen (PERCOCET) 10-325 MG tablet Take 1 tablet by mouth 5 (five) times daily as needed. 06/28/24     polyethylene glycol (MIRALAX / GLYCOLAX) 17 g packet Take 17 g by mouth daily as needed for moderate constipation.    [provider]  prochlorperazine (COMPAZINE) 10 MG tablet Take 1 tablet (10 mg total) by mouth every 6 (six) hours as needed for nausea or vomiting. 11/16/23   Ajewole, Christana, MD  promethazine (PHENERGAN) 12.5 MG tablet Take 1 tablet (12.5 mg total) by mouth every 6 (six) hours as needed for nausea or vomiting. 11/13/23   Ajewole, Christana, MD  promethazine-dextromethorphan (PROMETHAZINE-DM) 6.25-15 MG/5ML syrup Take 5 mLs by mouth 4 (four) times daily as needed for cough. 05/08/24   Reddick, Johnathan B, NP  rosuvastatin (CRESTOR) 40 MG tablet Take 1 tablet (40 mg total) by mouth daily. 03/29/24   Anner Alm ORN, MD  sevelamer carbonate (RENVELA) 800 MG tablet Take 1,600-3,200 mg  by mouth See admin instructions. Take 3200 mg with each meal and 1600 mg with each snack    [provider]  Continuous Blood Gluc Transmit (DEXCOM G6 TRANSMITTER) MISC Use to check blood sugar at least 6 times a day 03/03/22 03/13/23  Golda Lynwood PARAS, MD    Family History Family History  Problem Relation Age of Onset   Stroke Mother    Diabetes Mother    Hypertension Mother    Aneurysm Mother    Diabetes Father    Hypertension Father    Diabetes Sister    Hypertension Sister    Diabetes Maternal Grandmother    Diabetes Maternal Grandfather    Cancer Paternal Grandfather        Prostate    Social History Social History   Tobacco Use   Smoking status: Former    Current packs/day: 0.00    Types: Cigarettes    Quit date: 02/28/2015    Years since quitting: 9.3   Smokeless tobacco: Never  Vaping Use   Vaping status: Never Used  Substance Use Topics   Alcohol use: Not Currently    Comment: ~ 1 a month   Drug use: Not Currently    Comment: last use 03/2021     Allergies   Adhesive [tape]   Review of Systems Review of Systems  Genitourinary:  Positive for dyspareunia (reports having pain after most recent sexual encounter) and vaginal pain. Negative for dysuria, genital sores, vaginal bleeding and vaginal discharge.  Skin:  Negative for rash.     Physical Exam Triage Vital Signs ED Triage Vitals  Encounter Vitals Group     BP 06/27/24 1545 116/74     Girls Systolic BP Percentile --      Girls Diastolic BP Percentile --      Boys Systolic BP Percentile --      Boys Diastolic BP Percentile --      Pulse Rate 06/27/24 1545 92     Resp 06/27/24 1545 18     Temp 06/27/24 1545 98.5 F (36.9 C)     Temp Source 06/27/24 1545 Oral     SpO2 06/27/24 1545 96 %     Weight 06/27/24 1547 204 lb 12.9 oz (92.9 kg)     Height 06/27/24 1547 5' 5 (1.651 m)     Head Circumference --      Peak Flow  --      Pain Score 06/27/24 1547 0     Pain Loc --      Pain Education --      Exclude from Growth Chart --    No data found.  Updated Vital Signs BP 116/74 (BP Location: Left Arm)   Pulse 92   Temp 98.5 F (36.9 C) (Oral)   Resp 18   Ht 5' 5 (1.651 m)   Wt 204 lb 12.9 oz (92.9 kg)   LMP  (LMP Unknown)   SpO2 96%   BMI 34.08 kg/m   Visual Acuity Right Eye Distance:   Left Eye Distance:   Bilateral Distance:    Right Eye Near:   Left Eye Near:    Bilateral Near:     Physical Exam Vitals reviewed.  Constitutional:      General: She is awake.     Appearance: Normal appearance. She is well-developed and well-groomed.  HENT:     Head: Normocephalic and atraumatic.  Eyes:     General: Lids are normal. Gaze aligned appropriately.  Extraocular Movements: Extraocular movements intact.     Conjunctiva/sclera: Conjunctivae normal.  Pulmonary:     Effort: Pulmonary effort is normal.  Neurological:     Mental Status: She is alert and oriented to person, place, and time.  Psychiatric:        Attention and Perception: Attention and perception normal.        Mood and Affect: Mood and affect normal.        Speech: Speech normal.        Behavior: Behavior normal. Behavior is cooperative.      UC Treatments / Results  Labs (all labs ordered are listed, but only abnormal results are displayed) Labs Reviewed  RPR  HIV ANTIBODY (ROUTINE TESTING W REFLEX)  CERVICOVAGINAL ANCILLARY ONLY    EKG   Radiology No results found.  Procedures Procedures (including critical care time)  Medications Ordered in UC Medications - No data to display  Initial Impression / Assessment and Plan / UC Course  I have reviewed the triage vital signs and the nursing notes.  Pertinent labs & imaging results that were available during my care of the patient were reviewed by me and considered in my medical decision making (see chart for details).      Final Clinical Impressions(s)  / UC Diagnoses   Final diagnoses:  Screening examination for STD (sexually transmitted disease)  Vaginal discomfort  Encounter for screening for human immunodeficiency virus (HIV)   Patient presents today requesting STD testing.  She reports that her long-term sexual partner has recently been sexually active with a new female partner and she has been intermittently having recurrent vulvovaginal discomfort.  She states that her sexual partner mentioned concerned that the patient may have contracted an STD and asked if she had recently tested positive for anything so this made patient concerned for potential exposure.  She would like cervicovaginal swab and blood work today.  Cervicovaginal swab collected to assess for BV, yeast, trichomoniasis, gonorrhea, chlamydia.  Blood work collected to assess for HIV and syphilis.  Reviewed safe sex practices which were also provided in after visit summary.  Follow-up as needed or indicated by test results.    Discharge Instructions      You were seen today for STD screening.  We collected a cervicovaginal swab that we will assess for gonorrhea, chlamydia, trichomonas, bacterial vaginosis, yeast.  If collected blood work that we will assess for HIV and syphilis.  We will keep you updated with these results once they are available.  If any medications are indicated by those test results we will call you and medications will either be sent to the pharmacy on file or you can return to the urgent care for an injection.  It is recommended that you refrain from sexual activity until your test results are negative or until you have completed an appropriate medication regimen as dictated by your test results.  Please use a condom or another barrier method to help prevent STD transmission.  Please make sure that you communicate with your partners regarding your test results should any positive results, about as they will also need to be tested and screened.      ED  Prescriptions   None    PDMP not reviewed this encounter.   Marylene Rocky BRAVO, PA-C 06/27/24 1631

## 2024-06-27 NOTE — Discharge Instructions (Signed)
 You were seen today for STD screening.  We collected a cervicovaginal swab that we will assess for gonorrhea, chlamydia, trichomonas, bacterial vaginosis, yeast.  If collected blood work that we will assess for HIV and syphilis.  We will keep you updated with these results once they are available.  If any medications are indicated by those test results we will call you and medications will either be sent to the pharmacy on file or you can return to the urgent care for an injection.  It is recommended that you refrain from sexual activity until your test results are negative or until you have completed an appropriate medication regimen as dictated by your test results.  Please use a condom or another barrier method to help prevent STD transmission.  Please make sure that you communicate with your partners regarding your test results should any positive results, about as they will also need to be tested and screened.

## 2024-06-28 ENCOUNTER — Other Ambulatory Visit (HOSPITAL_COMMUNITY): Payer: Self-pay

## 2024-06-28 LAB — HIV ANTIBODY (ROUTINE TESTING W REFLEX): HIV Screen 4th Generation wRfx: NONREACTIVE

## 2024-06-28 LAB — CERVICOVAGINAL ANCILLARY ONLY
Bacterial Vaginitis (gardnerella): NEGATIVE
Candida Glabrata: POSITIVE — AB
Candida Vaginitis: NEGATIVE
Chlamydia: NEGATIVE
Comment: NEGATIVE
Comment: NEGATIVE
Comment: NEGATIVE
Comment: NEGATIVE
Comment: NEGATIVE
Comment: NORMAL
Neisseria Gonorrhea: NEGATIVE
Trichomonas: NEGATIVE

## 2024-06-28 LAB — RPR: RPR Ser Ql: NONREACTIVE

## 2024-07-01 ENCOUNTER — Ambulatory Visit (HOSPITAL_COMMUNITY): Payer: Self-pay

## 2024-07-10 ENCOUNTER — Ambulatory Visit (HOSPITAL_COMMUNITY): Admission: RE | Admit: 2024-07-10 | Source: Ambulatory Visit

## 2024-07-10 ENCOUNTER — Ambulatory Visit: Admitting: Vascular Surgery

## 2024-07-16 ENCOUNTER — Ambulatory Visit (HOSPITAL_COMMUNITY): Admission: RE | Admit: 2024-07-16 | Source: Ambulatory Visit

## 2024-07-23 ENCOUNTER — Encounter (HOSPITAL_COMMUNITY): Payer: Self-pay

## 2024-07-24 ENCOUNTER — Encounter (HOSPITAL_COMMUNITY): Payer: Self-pay

## 2024-07-24 ENCOUNTER — Encounter: Payer: Self-pay | Admitting: Student

## 2024-07-24 ENCOUNTER — Other Ambulatory Visit: Payer: Self-pay

## 2024-07-24 ENCOUNTER — Other Ambulatory Visit (HOSPITAL_COMMUNITY): Payer: Self-pay

## 2024-07-24 ENCOUNTER — Ambulatory Visit (INDEPENDENT_AMBULATORY_CARE_PROVIDER_SITE_OTHER): Admitting: Student

## 2024-07-24 VITALS — BP 127/67 | HR 85 | Temp 99.6°F | Resp 28 | Ht 65.0 in | Wt 197.6 lb

## 2024-07-24 DIAGNOSIS — Z794 Long term (current) use of insulin: Secondary | ICD-10-CM

## 2024-07-24 DIAGNOSIS — J069 Acute upper respiratory infection, unspecified: Secondary | ICD-10-CM

## 2024-07-24 DIAGNOSIS — E113413 Type 2 diabetes mellitus with severe nonproliferative diabetic retinopathy with macular edema, bilateral: Secondary | ICD-10-CM

## 2024-07-24 DIAGNOSIS — I10 Essential (primary) hypertension: Secondary | ICD-10-CM

## 2024-07-24 LAB — POCT GLYCOSYLATED HEMOGLOBIN (HGB A1C): HbA1c, POC (controlled diabetic range): 6.8 % (ref 0.0–7.0)

## 2024-07-24 LAB — GLUCOSE, CAPILLARY: Glucose-Capillary: 96 mg/dL (ref 70–99)

## 2024-07-24 MED ORDER — DEXTROMETHORPHAN-GUAIFENESIN 10-100 MG/5ML PO SYRP
5.0000 mL | ORAL_SOLUTION | Freq: Two times a day (BID) | ORAL | 0 refills | Status: DC
  Filled 2024-07-24: qty 118, 12d supply, fill #0

## 2024-07-24 MED ORDER — MOUNJARO 2.5 MG/0.5ML ~~LOC~~ SOAJ
2.5000 mg | SUBCUTANEOUS | 1 refills | Status: AC
  Filled 2024-07-24: qty 2, 28d supply, fill #0

## 2024-07-24 MED ORDER — DEXCOM G7 SENSOR MISC
3 refills | Status: AC
  Filled 2024-07-24 – 2024-07-29 (×4): qty 9, 90d supply, fill #0
  Filled 2024-07-29: qty 9, 30d supply, fill #0
  Filled 2024-07-30: qty 9, 90d supply, fill #0
  Filled 2024-09-25: qty 3, 30d supply, fill #1
  Filled 2024-09-25: qty 9, 90d supply, fill #1
  Filled 2024-10-22: qty 3, 30d supply, fill #2

## 2024-07-24 MED ORDER — MOUNJARO 5 MG/0.5ML ~~LOC~~ SOAJ
5.0000 mg | SUBCUTANEOUS | 1 refills | Status: AC
  Filled 2024-07-24: qty 2, 28d supply, fill #0

## 2024-07-24 NOTE — Assessment & Plan Note (Addendum)
 Afebrile and lungs are clear.  Do not suspect pneumonia or an asthma exacerbation.  Will treat symptomatically as a viral illness. - Robitussin syrup sent to pharmacy

## 2024-07-24 NOTE — Assessment & Plan Note (Signed)
 At goal, 127/67.  Follows with nephrology. - Prescribed Toprol  tartrate 25 twice daily but fill history irregular

## 2024-07-24 NOTE — Patient Instructions (Addendum)
 Take cough syrup twice daily as needed.  Start mounjaro  injections. I will give you 6 doses of the 2.5mg  dose. Your next refill will be for 5mg  weekly injections. Take that weekly until your next appointment.  Please return in 3 months for a diabetes check.

## 2024-07-24 NOTE — Assessment & Plan Note (Signed)
 A1c 6.8.  She is on insulin  therapy alone.  In the past she tried GLP-1 medicines and did well but for unclear reasons has not continued on them.  Given her long-term use of insulin  I think that this medicine would be appropriate to help reduce her insulin  and also reduce her weight. - Start Mounjaro .  She would like to titrate the medicine slowly as she is concerned about GI side effects which is reasonable.  Will do 8 weeks at 2.5 mg/week injections and then thereafter increase to 5 mg/week injections. - Return to clinic in 3 months

## 2024-07-24 NOTE — Progress Notes (Signed)
 CC: Acute visit for upper respiratory infection  HPI:  [name] is a 48 y.o. female with a PMH stated below who presents today for evaluation.  Please see problem based assessment and plan for additional details.  Past Medical History:  Diagnosis Date   Abnormal uterine bleeding (AUB)    Anxiety    Arthritis of knee    Left, Gel and cortisone injections @ [facility]   Asthma    prn inhaler   Bipolar disorder (HCC)    CAD S/P BMS PCI to prox LAD cardiologist--- dr anner Pais LAD to Mid LAD lesion, 90% stenosed. Post intervention - Vision BMS 3.0 mm x 18 mm (~3.5 mm) there is a 0% residual stenosis. 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   left 2018; right 2019   Depression    Diabetic foot ulcer (HCC) 04/22/2019   Diabetic neuropathy (HCC)    feet   Diabetic retinopathy, nonproliferative, severe (HCC)    bilateral   Dyspnea    with too much fluid   ESRD (end stage renal disease) on dialysis [facility])    ?chronic interstitial nephritis  ([facility]   H/O non-ST elevation myocardial infarction (NSTEMI) 03/2016   Found in 90% mid LAD lesion treated with bare-metal stent (BMS) PCI - vision BMS 3.0 mm x 18 mm   Heart murmur    History of blood transfusion    History of MRSA infection 2007   Hyperlipidemia    Hypertension    IDA (iron deficiency anemia)    Insulin  dependent type 2 diabetes mellitus (HCC)    Iron deficiency anemia    takes iron supplement   Myocardial infarction [facility])    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  Vitals:   07/24/24 1532  BP: 127/67  Pulse: 85  Resp: (!) 28  Temp: 99.6 F (37.6 C)  TempSrc: Oral  SpO2: 100%  Weight: 197 lb 9.6 oz (89.6 kg)  Height: 5' 5 (1.651 m)    Physical Exam: Constitutional: well-appearing woman in no acute distress.  Wearing a mask. HENT: normocephalic atraumatic, mucous membranes moist Cardiovascular: regular rate and rhythm, no m/r/g Pulmonary/Chest: normal work of breathing on room air, lungs clear to auscultation bilaterally Abdominal: soft, non-tender, non-distended MSK: normal bulk and tone Skin: warm and dry Psych: normal mood and behavior  Assessment & Plan:   Patient discussed with Dr. Trudy  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, with long-term current use of insulin  (HCC) A1c 6.8.  She is on insulin  therapy alone.  In the past she tried GLP-1 medicines and did well but for unclear reasons has not continued on them.  Given her long-term use of insulin  I think that this medicine would be appropriate to help reduce her insulin  and also reduce her weight. - Start  Mounjaro .  She would like to titrate the medicine slowly as she is concerned about GI side effects which is reasonable.  Will do 8 weeks at 2.5 mg/week injections and then thereafter increase to 5 mg/week injections. - Return to clinic in 3 months  Essential hypertension At goal, 127/67.  Follows with nephrology. - Prescribed Toprol  tartrate 25 twice daily but fill history irregular  Viral upper respiratory illness Afebrile and lungs are clear.  Do not suspect pneumonia or an asthma exacerbation.  Will treat symptomatically as a viral illness. - Robitussin syrup sent to pharmacy  RTC in 3 months for diabetes management and other chronic conditions.  Lonni Africa, D.O. [facility]  Health Internal Medicine, PGY-1 Phone: [phone] Date 07/24/2024 Time 4:53 PM

## 2024-07-25 ENCOUNTER — Telehealth: Payer: Self-pay

## 2024-07-25 NOTE — Telephone Encounter (Signed)
 Prior Authorization for patient (Dexcom G7 Sensor) came through on cover my meds was submitted with last office notes and labs awaiting approval or denial.  [id number]

## 2024-07-26 ENCOUNTER — Other Ambulatory Visit (HOSPITAL_COMMUNITY): Payer: Self-pay

## 2024-07-26 NOTE — Telephone Encounter (Signed)
 Call from patient requesting a sample of the Dexcom G 7 Sensors.  None available in the Clinics.  PA has been sent.  Awaiting determination.  Pharmacy ran claim.  Approval has not been done. Informed patient of.  Asked patient to check with Pharmacy later today.

## 2024-07-29 ENCOUNTER — Encounter (HOSPITAL_COMMUNITY): Payer: Self-pay

## 2024-07-29 ENCOUNTER — Other Ambulatory Visit (HOSPITAL_COMMUNITY): Payer: Self-pay

## 2024-07-29 MED ORDER — OXYCODONE-ACETAMINOPHEN 10-325 MG PO TABS
1.0000 | ORAL_TABLET | Freq: Every day | ORAL | 0 refills | Status: DC
  Filled 2024-07-29: qty 150, 30d supply, fill #0

## 2024-07-29 NOTE — Telephone Encounter (Signed)
 I called and spoke to the patient giving her a update regarding the pa I submitted for Dexcom sensors. I advised the patient that I haven't received a response from her insurance and that I will resubmit the pa if I don't receive anything by 12:00 today.  07/29/2024 @12 :00, PA has been resubmitted awaiting approval or denial.

## 2024-07-29 NOTE — Progress Notes (Signed)
 Internal Medicine Clinic Attending  Case discussed with the resident at the time of the visit.  We reviewed the resident's history and exam and pertinent patient test results.  I agree with the assessment, diagnosis, and plan of care documented in the resident's note.

## 2024-07-30 ENCOUNTER — Other Ambulatory Visit (HOSPITAL_COMMUNITY): Payer: Self-pay

## 2024-07-30 ENCOUNTER — Ambulatory Visit: Payer: Self-pay

## 2024-07-30 DIAGNOSIS — G9331 Postviral fatigue syndrome: Secondary | ICD-10-CM

## 2024-07-30 NOTE — Telephone Encounter (Signed)
 Stephanie Lent (KeyBETHA DOVER) PA Case ID #: [id number] Need Help? Call us  at [phone] Outcome Approved today by [employer] Medicare 2017 NCPDP Request Reference Number: [id number]. DEXCOM G7 MIS SENSOR is approved through 11/13/2024. Your patient may now fill this prescription and it will be covered. Effective Date: 07/25/2024 Authorization Expiration Date: 11/13/2024 Drug Dexcom G7 Sensor ePA cloud logo Form [employer] Medicare Part D Electronic Prior Authorization Form [phone] NCPDP)  Patient is aware of approval.

## 2024-07-30 NOTE — Telephone Encounter (Signed)
 FYI Only or Action Required?: FYI only for provider.  Patient was last seen in primary care on 07/24/2024 by Harrie Bruckner, DO.  Called Nurse Triage reporting Cough and Nasal Congestion.  Symptoms began 11-12 days ago.  Interventions attempted: Prescription medications: Tussin cough syrup.  Symptoms are: productive cough with clear to yellow/green mucus, sore throat, chest sore after coughing spells and gagging after coughing spells, wheezing in the morning and when lying down unchanged.  Triage Disposition: See Physician Within 24 Hours (overriding See HCP Within 4 Hours (Or PCP Triage))  Patient/caregiver understands and will follow disposition?: Yes              Copied from CRM [id number]. Topic: Clinical - Medical Advice >> Jul 30, 2024 11:52 AM Graeme ORN wrote: Reason for CRM: Patient called. Recently seen. Diabetic tussin not working requesting asthmatic cough medication. Still has congestion and cough.  [facility]. Thank You Reason for Disposition  Wheezing is present  Answer Assessment - Initial Assessment Questions 1. ONSET: When did the cough begin?      Sept 4th or 5th.  2. SEVERITY: How bad is the cough today?      At times she states it gags her when the phlegm comes up.  3. SPUTUM: Describe the color of your sputum (e.g., none, dry cough; clear, white, yellow, green)     She states she has been getting up plenty of phlegm. Clear and sometimes in the morning it has yellow or greenish color.  4. HEMOPTYSIS: Are you coughing up any blood? If Yes, ask: How much? (e.g., flecks, streaks, tablespoons, etc.)     No.  5. DIFFICULTY BREATHING: Are you having difficulty breathing? If Yes, ask: How bad is it? (e.g., mild, moderate, severe)      She states she has heard wheezing in the morning when she wakes up, different times she notices it but mostly when lying down. She states today no difficulty breathing but  states she did have some SOB difficulty catching her breath on 9/8-9/10.  6. FEVER: Do you have a fever? If Yes, ask: What is your temperature, how was it measured, and when did it start?     No. Highest was 99.7.  7. CARDIAC HISTORY: Do you have any history of heart disease? (e.g., heart attack, congestive heart failure)      Essential hypertension PAOD (peripheral arterial occlusive disease) (HCC) CAD S/P BMS PCI to prox LAD Hypertensive heart disease with chronic diastolic congestive heart failure (HCC)   8. LUNG HISTORY: Do you have any history of lung disease?  (e.g., pulmonary embolus, asthma, emphysema)     Asthma  9. PE RISK FACTORS: Do you have a history of blood clots? (or: recent major surgery, recent prolonged travel, bedridden)     No.  10. OTHER SYMPTOMS: Do you have any other symptoms? (e.g., runny nose, wheezing, chest pain)       Nasal congestion, sore throat and chest sore after coughing.  11. PREGNANCY: Is there any chance you are pregnant? When was your last menstrual period?       N/A.  12. TRAVEL: Have you traveled out of the country in the last month? (e.g., travel history, exposures)       Unsure, she was in [place]  in a big crowd and states she went to a convention with over 6,000 people and may have been exposure.   Patient speaking in full sentences and no labored or distressed breathing.  Protocols used: Cough - Acute Productive-A-AH

## 2024-07-31 ENCOUNTER — Other Ambulatory Visit (HOSPITAL_COMMUNITY): Payer: Self-pay

## 2024-07-31 ENCOUNTER — Ambulatory Visit

## 2024-07-31 ENCOUNTER — Ambulatory Visit (INDEPENDENT_AMBULATORY_CARE_PROVIDER_SITE_OTHER)

## 2024-07-31 ENCOUNTER — Other Ambulatory Visit: Payer: Self-pay

## 2024-07-31 VITALS — Ht 65.0 in | Wt 200.0 lb

## 2024-07-31 VITALS — BP 152/71 | HR 88 | Temp 98.2°F | Ht 65.0 in | Wt 197.2 lb

## 2024-07-31 DIAGNOSIS — R058 Other specified cough: Secondary | ICD-10-CM

## 2024-07-31 DIAGNOSIS — I10 Essential (primary) hypertension: Secondary | ICD-10-CM | POA: Diagnosis not present

## 2024-07-31 DIAGNOSIS — Z87891 Personal history of nicotine dependence: Secondary | ICD-10-CM

## 2024-07-31 DIAGNOSIS — Z0000 Encounter for general adult medical examination without abnormal findings: Secondary | ICD-10-CM | POA: Diagnosis not present

## 2024-07-31 DIAGNOSIS — Z8249 Family history of ischemic heart disease and other diseases of the circulatory system: Secondary | ICD-10-CM

## 2024-07-31 DIAGNOSIS — J4521 Mild intermittent asthma with (acute) exacerbation: Secondary | ICD-10-CM

## 2024-07-31 MED ORDER — PREDNISONE 20 MG PO TABS
40.0000 mg | ORAL_TABLET | Freq: Every day | ORAL | 0 refills | Status: AC
Start: 2024-07-31 — End: 2024-08-05
  Filled 2024-07-31: qty 10, 5d supply, fill #0

## 2024-07-31 MED ORDER — FLUTICASONE PROPIONATE 50 MCG/ACT NA SUSP
1.0000 | Freq: Every day | NASAL | 2 refills | Status: AC
  Filled 2024-07-31: qty 16, 60d supply, fill #0

## 2024-07-31 MED ORDER — GUAIFENESIN ER 600 MG PO TB12
600.0000 mg | ORAL_TABLET | Freq: Two times a day (BID) | ORAL | 2 refills | Status: DC
  Filled 2024-07-31: qty 60, 30d supply, fill #0

## 2024-07-31 NOTE — Assessment & Plan Note (Signed)
 See above. - prednisone  40 mg x5d, cont albuterol  PRN - at her next visit, recommend formal spirometry for further evaluation of possible obstructive airway disease

## 2024-07-31 NOTE — Progress Notes (Signed)
 Subjective:   Jane [name] is a 48 y.o. female who presents for Medicare Annual (Subsequent) preventive examination.  Visit Complete: Virtual I connected with  Stephanie LITTIE Lent on 07/31/24 by a video and audio enabled telemedicine application and verified that I am speaking with the correct person using two identifiers.  Patient Location: Home  Provider Location: Home Office  I discussed the limitations of evaluation and management by telemedicine. The patient expressed understanding and agreed to proceed.  Vital Signs: Because this visit was a virtual/telehealth visit, some criteria may be missing or patient reported. Any vitals not documented were not able to be obtained and vitals that have been documented are patient reported.   Cardiac Risk Factors include: diabetes mellitus     Objective:    Today's Vitals   07/31/24 0841  Weight: 200 lb (90.7 kg)  Height: 5' 5 (1.651 m)  PainSc: 8    Body mass index is 33.28 kg/m.     07/31/2024    8:52 AM 07/24/2024    3:41 PM 11/16/2023   11:00 AM 11/13/2023    7:08 AM 11/06/2023    8:23 AM 09/13/2023    5:11 PM 09/04/2023    9:21 AM  Advanced Directives  Does Patient Have a Medical Advance Directive? No No No No No No No  Would patient like information on creating a medical advance directive? No - Patient declined No - Patient declined No - Patient declined  Yes (MAU/Ambulatory/Procedural Areas - Information given)  No - Patient declined    Current Medications (verified) Outpatient Encounter Medications as of 07/31/2024  Medication Sig   acetaminophen  (TYLENOL ) 500 MG tablet Take 2 tablets (1,000 mg total) by mouth every 8 (eight) hours as needed for moderate pain, mild pain, fever or headache.   albuterol  (PROVENTIL  HFA) 108 (90 Base) MCG/ACT inhaler Inhale 2 puffs into the lungs every 4 (four) hours as needed for coughing, wheezing, or shortness of breath.   alum & mag hydroxide-simeth (MAALOX/MYLANTA) 200-200-20  MG/5ML suspension Take 15 mLs by mouth every 4 (four) hours as needed for indigestion or heartburn.   azelastine  (ASTELIN ) 0.1 % nasal spray Place 1 spray into both nostrils 2 (two) times daily. Use in each nostril as directed   benzonatate  (TESSALON ) 100 MG capsule Take 1 capsule (100 mg total) by mouth 3 (three) times daily as needed for cough.   cetirizine  (ZYRTEC ) 10 MG tablet Take 10 mg by mouth daily.   Continuous Glucose Sensor (DEXCOM G7 SENSOR) MISC Use it for 10 days to continuously monitor your blood sugars.   Dextromethorphan -guaiFENesin  10-100 MG/5ML liquid Take 5 mLs by mouth every 12 (twelve) hours.   dicyclomine  (BENTYL ) 10 MG capsule Take 1 capsule (10 mg total) by mouth 3 (three) times daily before meals.   DULoxetine  (CYMBALTA ) 30 MG capsule Take 1 capsule (30 mg total) by mouth daily.   gabapentin  (NEURONTIN ) 100 MG capsule Take 1 capsule (100 mg total) by mouth at bedtime.   icosapent  Ethyl (VASCEPA ) 1 g capsule Take 2 capsules (2 g total) by mouth 2 (two) times daily.   injection device for insulin  (INPEN 100-PINK-LILLY-HUMALOG ) DEVI Use to inject Humalog  insulin  for meals and correction insulin    insulin  degludec (TRESIBA ) 100 UNIT/ML FlexTouch Pen Inject 24 Units into the skin at bedtime.   insulin  lispro (HUMALOG ) 100 UNIT/ML cartridge Inject 6-20 Units total into the skin 3 (three) times daily with meals. Use with inpen to inject at meal time and for correction insulin   up to 40 units daily   lanthanum  (FOSRENOL ) 1000 MG chewable tablet Chew 1 tablet (1,000 mg total) by mouth 3 (three) times daily with meals.   leflunomide  (ARAVA ) 10 MG tablet Take 1 tablet (10 mg total) by mouth daily.   leflunomide  (ARAVA ) 10 MG tablet Take 1 tablet (10 mg total) by mouth daily.   methocarbamol  (ROBAXIN ) 500 MG tablet Take 1 tablet (500 mg total) by mouth every 6 (six) hours as needed for muscle spasms.   metoCLOPramide  (REGLAN ) 10 MG tablet Take 1 tablet by mouth once a day as needed    metoCLOPramide  (REGLAN ) 5 MG tablet Take 1 tablet (5 mg total) by mouth every 6 (six) hours as needed.   metoprolol  tartrate (LOPRESSOR ) 25 MG tablet Take 1 tablet (25 mg total) by mouth 2 (two) times daily. Do not take the night before dialysis, and take 2 hours after dialysis.   Multiple Vitamins-Minerals (HAIR SKIN & NAILS) TABS Take 1 tablet by mouth daily.   multivitamin (RENA-VIT) TABS tablet Take 1 tablet by mouth daily.   naloxone  (NARCAN ) nasal spray 4 mg/0.1 mL Place 1 spray into the nose as needed if found unresponsive and call 911 immediately.   omeprazole  (PRILOSEC) 20 MG capsule Take 1 capsule (20 mg total) by mouth daily.   oxyCODONE -acetaminophen  (PERCOCET) 10-325 MG tablet Take 1 tablet by mouth 5 (five) times daily as needed.   oxyCODONE -acetaminophen  (PERCOCET) 10-325 MG tablet Take 1 tablet by mouth 5 (five) times daily as needed.   oxyCODONE -acetaminophen  (PERCOCET) 10-325 MG tablet Take 1 tablet by mouth 5 (five) times daily as needed.   oxyCODONE -acetaminophen  (PERCOCET) 10-325 MG tablet Take 1 tablet by mouth 5 (five) times daily as needed.   polyethylene glycol (MIRALAX  / GLYCOLAX ) 17 g packet Take 17 g by mouth daily as needed for moderate constipation.   prochlorperazine  (COMPAZINE ) 10 MG tablet Take 1 tablet (10 mg total) by mouth every 6 (six) hours as needed for nausea or vomiting.   promethazine  (PHENERGAN ) 12.5 MG tablet Take 1 tablet (12.5 mg total) by mouth every 6 (six) hours as needed for nausea or vomiting.   promethazine -dextromethorphan  (PROMETHAZINE -DM) 6.25-15 MG/5ML syrup Take 5 mLs by mouth 4 (four) times daily as needed for cough.   rosuvastatin  (CRESTOR ) 40 MG tablet Take 1 tablet (40 mg total) by mouth daily.   sevelamer  carbonate (RENVELA ) 800 MG tablet Take 1,600-3,200 mg by mouth See admin instructions. Take 3200 mg with each meal and 1600 mg with each snack   tirzepatide  (MOUNJARO ) 2.5 MG/0.5ML Pen Inject 2.5 mg into the skin once a week for 6 doses.    [START ON 09/05/2024] tirzepatide  (MOUNJARO ) 5 MG/0.5ML Pen Inject 5 mg into the skin once a week for 8 doses.   [DISCONTINUED] Continuous Blood Gluc Transmit (DEXCOM G6 TRANSMITTER) MISC Use to check blood sugar at least 6 times a day   No facility-administered encounter medications on file as of 07/31/2024.    Allergies (verified) Adhesive [tape]   History: Past Medical History:  Diagnosis Date   Abnormal uterine bleeding (AUB)    Anxiety    Arthritis of knee    Left, Gel and cortisone injections @ [facility]   Asthma    prn inhaler   Bipolar disorder (HCC)    CAD S/P BMS PCI to prox LAD cardiologist--- dr anner Pais LAD to Mid LAD lesion, 90% stenosed. Post intervention - Vision BMS 3.0 mm x 18 mm (~3.5 mm) there is a 0% residual  stenosis. 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   left 2018; right 2019   Depression    Diabetic foot ulcer (HCC) 04/22/2019   Diabetic neuropathy (HCC)    feet   Diabetic retinopathy, nonproliferative, severe (HCC)    bilateral   Dyspnea    with too much fluid   ESRD (end stage renal disease) on dialysis [facility])    ?chronic interstitial nephritis  ([facility]   H/O non-ST elevation myocardial infarction (NSTEMI) 03/2016   Found in 90% mid LAD lesion treated with bare-metal stent (BMS) PCI - vision BMS 3.0 mm x 18 mm   Heart murmur    History of blood transfusion    History of MRSA infection 2007   Hyperlipidemia    Hypertension    IDA (iron deficiency anemia)    Insulin  dependent type 2 diabetes mellitus (HCC)    Iron deficiency anemia    takes iron supplement   Myocardial infarction [facility])    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   PAOD (peripheral arterial occlusive disease) (HCC)    vascular--- dr laurence   Pneumonia 11/08/2021   PONV (postoperative nausea and vomiting)    S/P arteriovenous  (AV) fistula creation 07/2017   S/p bare metal coronary artery stent 03/31/2016   BMS x1  to pLAD   Sickle cell trait (HCC)    Ulcer of left foot due to type 2 diabetes mellitus (HCC) 06/03/2019   Past Surgical History:  Procedure Laterality Date   A/V FISTULAGRAM N/A 04/13/2021   Procedure: A/V FISTULAGRAM - Right Arm;  Surgeon: Serene Gaile ORN, MD;  [facility]: [facility];  Service: Cardiovascular;  Laterality: N/A;   A/V SHUNT INTERVENTION Right 05/30/2024   Procedure: A/V SHUNT INTERVENTION;  Surgeon: Sheree Penne Bruckner, MD;  Location: [facility];  Service: Cardiovascular;  Laterality: Right;   ACHILLES TENDON LENGTHENING Left 11/24/2016   Procedure: Left Achilles tendon lengthening (open);  Surgeon: Norleen Armor, MD;  Location: [facility];  Service: Orthopedics;  Laterality: Left;   AV FISTULA PLACEMENT Right 07/31/2017   Procedure: ARTERIOVENOUS BRACHIOCEPHALIC FISTULA CREATION RIGHT ARM;  Surgeon: laurence Redell CROME, MD;  Location: [facility] OR;  Service: Vascular;  Laterality: Right;   CALCANEAL OSTEOTOMY Left 11/24/2016   Procedure: Left hindfoot osteotomy and fusion;  Surgeon: Norleen Armor, MD;  Location: [facility];  Service: Orthopedics;  Laterality: Left;   CARDIAC CATHETERIZATION N/A 03/31/2016   Procedure: Left Heart Cath and Coronary Angiography;  Surgeon: Dorn JINNY Lesches, MD;  Location: [facility] INVASIVE CV LAB: 90% early mLAD, normal LV Fxn   CARDIAC CATHETERIZATION N/A 03/31/2016   Procedure: Coronary Stent Intervention;  Surgeon: Dorn JINNY Lesches, MD;  Location: [facility];  Service: Cardiovascular: PCI to mLAD BMS Vision 3.0 mm x 18 mm   CESAREAN SECTION  1997   CYSTOSCOPY N/A 11/13/2023   Procedure: CYSTOSCOPY;  Surgeon: Jeralyn Crutch, MD;  Location: [facility];  Service: Gynecology;  Laterality: N/A;   CYSTOSCOPY WITH FULGERATION N/A 08/30/2022   Procedure: CYSTOSCOPY WITH FULGERATION, BIOPSY, LEFT RETROGRADE PYELOGRAM;  Surgeon: Elisabeth Valli BIRCH, MD;  Location: [facility];  Service: Urology;  Laterality: N/A;   DILITATION & CURRETTAGE/HYSTROSCOPY WITH NOVASURE ABLATION N/A 09/15/2020   Procedure: DILATATION & CURETTAGE/HYSTEROSCOPY;  Surgeon: Corene Coy, MD;  Location: [facility] [place];  Service: Gynecology;  Laterality: N/A;   DILITATION & CURRETTAGE/HYSTROSCOPY WITH NOVASURE ABLATION N/A 12/21/2021   Procedure: DILATATION & CURETTAGE/HYSTEROSCOPY WITH NOVASURE ABLATION/ IUD REMOVAL;  Surgeon: Corene Coy, MD;  Location: [facility];  Service: Gynecology;  Laterality: N/A;   FISTULA SUPERFICIALIZATION Right 11/15/2017   Procedure: FISTULA SUPERFICIALIZATION RIGHT BRACHIOCEPHALIC  RIGHT;  Surgeon: Laurence Redell CROME, MD;  Location: [facility] OR;  Service: Vascular;  Laterality: Right;   FOOT SURGERY     multiple for charcot   PERIPHERAL VASCULAR BALLOON ANGIOPLASTY Right 04/13/2021   Procedure: PERIPHERAL VASCULAR BALLOON ANGIOPLASTY;  Surgeon: Serene Gaile ORN, MD;  [facility];  Service: Cardiovascular;  Laterality: Right;  arm fistula   TOTAL KNEE ARTHROPLASTY Left 07/19/2023   Procedure: TOTAL KNEE ARTHROPLASTY;  Surgeon: Fidel Redell, MD;  Location: [facility];  Service: Orthopedics;  Laterality: Left;  150   TOTAL LAPAROSCOPIC HYSTERECTOMY WITH SALPINGECTOMY Bilateral 11/13/2023   Procedure: TOTAL LAPAROSCOPIC HYSTERECTOMY WITH SALPINGECTOMY;  Surgeon: Jeralyn Crutch, MD;  Location: [facility];  Service: Gynecology;  Laterality: Bilateral;   TRANSTHORACIC ECHOCARDIOGRAM  03/23/2021   EF 55 to 60%.  No R WMA.  Mild septal LVH with moderate posterior hypertrophy.  Normal RV size and function.  Normal RVP/PASP.[name]  Trivial MR.   TUBAL LIGATION  1997   Family History  Problem Relation Age of Onset   Stroke Mother    Diabetes Mother    Hypertension Mother    Aneurysm Mother    Diabetes Father    Hypertension Father    Diabetes Sister    Hypertension Sister    Diabetes Maternal  Grandmother    Diabetes Maternal Grandfather    Cancer Paternal Grandfather        Prostate   Social History   Socioeconomic History   Marital status: Single    Spouse name: Not on file   Number of children: 2   Years of education: 13   Highest education level: Not on file  Occupational History   Occupation:   Network engineer: [employer]  Tobacco Use   Smoking status: Former    Current packs/day: 0.00    Types: Cigarettes    Quit date: 02/28/2015    Years since quitting: 9.4   Smokeless tobacco: Never  Vaping Use   Vaping status: Never Used  Substance and Sexual Activity   Alcohol use: Not Currently    Comment: ~ 1 a month   Drug use: Not Currently    Comment: last use 22-Apr-2021   Sexual activity: Yes    Birth control/protection: Surgical  Other Topics Concern   Not on file  Social History Narrative   Patient does not drink caffeine.   Patient is right handed.       --> 2020 was a difficult year: Her sister died in 2024-04-22 (potentially because of COVID-19), her recent ex-boyfriend died in either 2024-03-23 or 04/22/2024, this was followed by one of her close friends being murdered by the boyfriend.    --> Good news for 2020 was that she had a new grandkids.   Social Drivers of Corporate investment banker Strain: High Risk (07/31/2024)   Overall Financial Resource Strain (CARDIA)    Difficulty of Paying Living Expenses: Very hard  Food Insecurity: No Food Insecurity (07/31/2024)   Hunger Vital Sign    Worried About Running Out of Food in the Last Year: Never true    Ran Out of Food in the Last Year: Never true  Transportation Needs: Unmet Transportation Needs (07/31/2024)   PRAPARE - Administrator, Civil Service (Medical): Yes    Lack of Transportation (Non-Medical): Yes  Physical Activity: Sufficiently Active (07/31/2024)   Exercise Vital Sign    Days of Exercise per Week: 7 days    Minutes of Exercise per Session: 40 min  Stress: No Stress Concern Present  (07/31/2024)   [employer] of Occupational Health - Occupational Stress Questionnaire    Feeling of Stress: Not at all  Social Connections: Moderately Isolated (07/31/2024)   Social Connection and Isolation Panel    Frequency of Communication with Friends and Family: Three times a week    Frequency of Social Gatherings with Friends and Family: Once a week    Attends Religious Services: More than 4 times per year    Active Member of [employer] or Organizations: No    Attends Engineer, structural: Never    Marital Status: Divorced    Tobacco Counseling Counseling given: Not Answered   Clinical Intake:  Pre-visit preparation completed: Yes  Pain : 0-10 Pain Score: 8  Pain Type: Chronic pain Pain Location: Chest Pain Orientation: Left Pain Radiating Towards: neck shoulder Pain Descriptors / Indicators: Aching Pain Onset: Other (comment) Pain Frequency: Constant Pain Relieving Factors: pain med Effect of Pain on Daily Activities: hurts to walk  Pain Relieving Factors: pain med  BMI - recorded: 32.88 Nutritional Status: BMI > 30  Obese Nutritional Risks: Unintentional weight loss, Unintentional weight gain, Nausea/ vomitting/ diarrhea Diabetes: Yes CBG done?: No CBG resulted in Enter/ Edit results?: No Did pt. bring in CBG monitor from home?: No  How often do you need to have someone help you when you read instructions, pamphlets, or other written materials from your doctor or pharmacy?: 1 - Never What is the last grade level you completed in school?: 12th grade  Interpreter Needed?: No  Information entered by :: Arnette Hoots, CMA   Activities of Daily Living    07/31/2024    8:44 AM 07/24/2024    3:30 PM  In your present state of health, do you have any difficulty performing the following activities:  Hearing? 0 0  Vision? 0 0  Difficulty concentrating or making decisions? 0 0  Walking or climbing stairs? 0 0  Dressing or bathing? 0 0  Doing errands,  shopping? 0 0  Preparing Food and eating ? N   Using the Toilet? N   In the past six months, have you accidently leaked urine? N   Do you have problems with loss of bowel control? N   Managing your Medications? N   Managing your Finances? N   Housekeeping or managing your Housekeeping? N     Patient Care Team: Harrie Bruckner, DO as PCP - General Anner Alm ORN, MD as PCP - Cardiology (Cardiology) Kit Rush, MD as Consulting Physician (Orthopedic Surgery) Gearline Norris, MD as Consulting Physician (Nephrology) Anner Alm ORN, MD as Consulting Physician (Cardiology) Deterding, Lynwood, MD as Consulting Physician (Nephrology) [facility]  Indicate any recent Medical Services you may have received from other than [facility] providers in the past year (date may be approximate).     Assessment:   This is a routine wellness examination for Sabrine.  Hearing/Vision screen Hearing Screening - Comments:: No issues. Vision Screening - Comments:: No issues   Goals Addressed   None    Depression Screen    07/31/2024    8:54 AM 06/26/2023    2:55 PM  06/26/2023    2:45 PM 03/13/2023    5:05 PM 08/09/2022   11:42 AM 05/26/2022   10:10 AM 05/02/2022    5:34 PM  PHQ 2/9 Scores  PHQ - 2 Score 0 5 5 1 1 3 2   PHQ- 9 Score  17 17 9 10 12 9     Fall Risk    07/31/2024    8:53 AM 07/24/2024    3:29 PM 06/26/2023    2:55 PM 06/26/2023    2:01 PM 04/13/2023    3:52 PM  Fall Risk   Falls in the past year? 0 1 1 1 1   Number falls in past yr:  0 0 0 1  Injury with Fall?  0 0 0 0  Risk for fall due to : History of fall(s) History of fall(s) No Fall Risks No Fall Risks Impaired balance/gait;Impaired mobility  Follow up Falls evaluation completed;Education provided Falls evaluation completed;Falls prevention discussed Falls evaluation completed;Falls prevention discussed Falls evaluation completed;Falls prevention discussed Falls evaluation completed    MEDICARE RISK AT  HOME: Medicare Risk at Home Any stairs in or around the home?: No If so, are there any without handrails?: No Home free of loose throw rugs in walkways, pet beds, electrical cords, etc?: No Adequate lighting in your home to reduce risk of falls?: Yes Life alert?: No Use of a cane, walker or w/c?: No Grab bars in the bathroom?: No Shower chair or bench in shower?: No Elevated toilet seat or a handicapped toilet?: No  TIMED UP AND GO:  Was the test performed?  No    Cognitive Function:        07/31/2024    8:55 AM 06/26/2023    2:56 PM  6CIT Screen  What Year? 0 points 0 points  What month? 0 points 0 points  What time? 0 points 0 points  Count back from 20 0 points 0 points  Months in reverse 0 points 0 points  Repeat phrase 10 points 0 points  Total Score 10 points 0 points    Immunizations Immunization History  Administered Date(s) Administered   19-influenza Whole 07/30/2019   Hepatitis B, ADULT 01/25/2018, 02/24/2018, 03/22/2018, 03/24/2018, 07/26/2018   Influenza Whole 01/11/2009, 07/22/2011   Influenza,inj,Quad PF,6+ Mos 08/05/2014, 12/24/2015, 08/04/2016, 08/29/2017, 08/01/2018, 07/30/2019, 09/24/2020   Moderna Sars-Covid-2 Vaccination 02/27/2020, 03/26/2020   PFIZER(Purple Top)SARS-COV-2 Vaccination 08/13/2020   Pneumococcal Conjugate-13 01/30/2018, 08/01/2018   Pneumococcal Polysaccharide-23 01/11/2009, 08/15/2014, 03/31/2018   Tdap 10/28/2013    TDAP status: Due, Education has been provided regarding the importance of this vaccine. Advised may receive this vaccine at local pharmacy or Health Dept. Aware to provide a copy of the vaccination record if obtained from local pharmacy or Health Dept. Verbalized acceptance and understanding.  Flu Vaccine status: Declined, Education has been provided regarding the importance of this vaccine but patient still declined. Advised may receive this vaccine at local pharmacy or Health Dept. Aware to provide a copy of the  vaccination record if obtained from local pharmacy or Health Dept. Verbalized acceptance and understanding.  Pneumococcal vaccine status: Declined,  Education has been provided regarding the importance of this vaccine but patient still declined. Advised may receive this vaccine at local pharmacy or Health Dept. Aware to provide a copy of the vaccination record if obtained from local pharmacy or Health Dept. Verbalized acceptance and understanding.   Covid-19 vaccine status: Declined, Education has been provided regarding the importance of this vaccine but patient still declined. Advised may receive this  vaccine at local pharmacy or Health Dept.or vaccine clinic. Aware to provide a copy of the vaccination record if obtained from local pharmacy or Health Dept. Verbalized acceptance and understanding.  Qualifies for Shingles Vaccine? No   Zostavax completed No   Shingrix Completed?: No.    Education has been provided regarding the importance of this vaccine. Patient has been advised to call insurance company to determine out of pocket expense if they have not yet received this vaccine. Advised may also receive vaccine at local pharmacy or Health Dept. Verbalized acceptance and understanding.  Screening Tests Health Maintenance  Topic Date Due   OPHTHALMOLOGY EXAM  04/06/2021   FOOT EXAM  02/16/2023   DTaP/Tdap/Td (2 - Td or Tdap) 10/29/2023   Influenza Vaccine  06/14/2024   Medicare Annual Wellness (AWV)  06/25/2024   COVID-19 Vaccine (4 - 2025-26 season) 07/15/2024   Colonoscopy  01/09/2025 (Originally 01/10/2020)   HEMOGLOBIN A1C  10/23/2024   Pneumococcal Vaccine (4 of 4 - PCV20 or PCV21) 01/09/2025   Mammogram  03/08/2026   Hepatitis B Vaccines 19-59 Average Risk  Completed   Hepatitis C Screening  Completed   HIV Screening  Completed   HPV VACCINES  Aged Out   Meningococcal B Vaccine  Aged Out    Health Maintenance  Health Maintenance Due  Topic Date Due   OPHTHALMOLOGY EXAM   04/06/2021   FOOT EXAM  02/16/2023   DTaP/Tdap/Td (2 - Td or Tdap) 10/29/2023   Influenza Vaccine  06/14/2024   Medicare Annual Wellness (AWV)  06/25/2024   COVID-19 Vaccine (4 - 2025-26 season) 07/15/2024    Colorectal cancer screening: Type of screening: Cologuard. Completed 10/2023. Repeat every 2 years  Mammogram status: Completed 03/08/2024. Repeat every year   Additional Screening:  Hepatitis C Screening: does not qualify;  Vision Screening: Recommended annual ophthalmology exams for early detection of glaucoma and other disorders of the eye. Is the patient up to date with their annual eye exam?  No  Who is the provider or what is the name of the office in which the patient attends annual eye exams? Dr Yves call to schedule If pt is not established with a provider, would they like to be referred to a provider to establish care? No .   Dental Screening: Recommended annual dental exams for proper oral hygiene  Diabetic Foot Exam: Diabetic Foot Exam: Overdue, Pt has been advised about the importance in completing this exam. Pt is scheduled for diabetic foot exam on not scheduled.  Community Resource Referral / Chronic Care Management: CRR required this visit?  No   CCM required this visit?  No     Plan:     I have personally reviewed and noted the following in the patient's chart:   Medical and social history Use of alcohol, tobacco or illicit drugs  Current medications and supplements including opioid prescriptions. Patient is currently taking opioid prescriptions. Information provided to patient regarding non-opioid alternatives. Patient advised to discuss non-opioid treatment plan with their provider. Functional ability and status Nutritional status Physical activity Advanced directives List of other physicians Hospitalizations, surgeries, and ER visits in previous 12 months Vitals Screenings to include cognitive, depression, and falls Referrals and  appointments  In addition, I have reviewed and discussed with patient certain preventive protocols, quality metrics, and best practice recommendations. A written personalized care plan for preventive services as well as general preventive health recommendations were provided to patient.     Arnette LOISE Hoots, CMA  07/31/2024   After Visit Summary: (MyChart) Due to this being a telephonic visit, the after visit summary with patients personalized plan was offered to patient via MyChart   Nurse Notes: Patient is having issues with knee pain and is seeing doctor today. She is getting her new CGM today and will install. She has declined help of resources. Patient states that she is not due for tdap as her ialysis keeps up with that. She will get us  date. Patient states that she did cologuard but does not feel comfortable going to do colonoscopy due to dialysis.

## 2024-07-31 NOTE — Assessment & Plan Note (Addendum)
 BP elevated in office, suspect in setting of acute illness.

## 2024-07-31 NOTE — Patient Instructions (Signed)
  Jane [name] , Thank you for taking time to come for your Medicare Wellness Visit. I appreciate your ongoing commitment to your health goals. Please review the following plan we discussed and let me know if I can assist you in the future.   These are the goals we discussed:  Goals      Blood Pressure < 140/90     BP Readings from Last 3 Encounters:  07/27/21 110/62  06/17/21 (!) 171/89  06/03/21 115/66          HEMOGLOBIN A1C < 7     Lab Results  Component Value Date   HGBA1C 6.8 (A) 04/01/2021   HGBA1C 9.0 (A) 01/07/2021   HGBA1C 9.5 (A) 01/29/2020   Lab Results  Component Value Date   MICROALBUR 40.6 (H) 08/05/2014   LDLCALC 66 01/27/2021   CREATININE 10.60 (H) 05/26/2021   Not meeting Hgb A1C goals- discussed strategies to improve diabetes management      LDL CALC < 100     Lab Results  Component Value Date   CHOL 130 01/27/2021   HDL 44 01/27/2021   LDLCALC 66 01/27/2021   TRIG 107 01/27/2021   CHOLHDL 3.0 01/27/2021   Encouraged patient to have lipid panel drawn ,that were ordered 06/22/20,when she has her preoperative labs      Make and Keep All Appointments     Timeframe:  Long-Range Goal Priority:  High Start Date:         10/30/20                    Expected End Date:    ongoing                 Follow Up Date 10/13/21   - ask family or friend for a ride - call to cancel if needed - keep a calendar with appointment dates    Why is this important?   Part of staying healthy is seeing the doctor for follow-up care.  If you forget your appointments, there are some things you can do to stay on track.    Notes: Patient missed ESRD on 08/25/21 due to illness        This is a list of the screening recommended for you and due dates:  Health Maintenance  Topic Date Due   Eye exam for diabetics  04/06/2021   Complete foot exam   02/16/2023   DTaP/Tdap/Td vaccine (2 - Td or Tdap) 10/29/2023   Flu Shot  06/14/2024   COVID-19 Vaccine (4 - 2025-26 season)  07/15/2024   Colon Cancer Screening  01/09/2025*   Hemoglobin A1C  10/23/2024   Pneumococcal Vaccine (4 of 4 - PCV20 or PCV21) 01/09/2025   Medicare Annual Wellness Visit  07/31/2025   Breast Cancer Screening  03/08/2026   Hepatitis B Vaccine  Completed   Hepatitis C Screening  Completed   HIV Screening  Completed   HPV Vaccine  Aged Out   Meningitis B Vaccine  Aged Out  *Topic was postponed. The date shown is not the original due date.

## 2024-07-31 NOTE — Patient Instructions (Signed)
 Thank you for coming in today. If you have any questions or concerns, please feel free to contact me via MyChart or call the office.   If you have gotten labs today, I will be in touch via MyChart or telephone.   Have a great day.

## 2024-07-31 NOTE — Progress Notes (Signed)
 Subjective:   Patient ID: Jane [name] female   DOB: Aug 31, 1976 48 y.o.   MRN: [id number]  HPI: Jane [name] is a 48 y.o. PMHx CAD c/b NSTEMI s/p BMS PCI to pLAD 2017, T2DM, ESRD on HD, HTN, HLD, ?asthma presenting for persistent wheezing & SOB following URI.  Seen in office on 9/10 with URI sxs, managed supportively w/ cough medicine. Despite taking these medications, she continued to feel ill with wheezing, rhinorrhea, productive cough and persistent SOB with exertion.  ?Asthma - pt carries diagnosis of asthma, was diagnosed as a child with frequent exacerbations however her asthma generally improved and was previously managed well on albuterol  monotherapy. No updated PFTs available in chart. Currently using her albuterol  multiple times daily, does help.   Past Medical History:  Diagnosis Date   Abnormal uterine bleeding (AUB)    Anxiety    Arthritis of knee    Left, Gel and cortisone injections @ [facility]   Asthma    prn inhaler   Bipolar disorder (HCC)    CAD S/P BMS PCI to prox LAD cardiologist--- dr anner Pais LAD to Mid LAD lesion, 90% stenosed. Post intervention - Vision BMS 3.0 mm x 18 mm (~3.5 mm) there is a 0% residual stenosis. 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   left 2018; right 2019   Depression    Diabetic foot ulcer (HCC) 04/22/2019   Diabetic neuropathy (HCC)    feet   Diabetic retinopathy, nonproliferative, severe (HCC)    bilateral   Dyspnea    with too much fluid   ESRD (end stage renal disease) on dialysis [facility])    ?chronic interstitial nephritis  ([facility]   H/O non-ST elevation myocardial infarction (NSTEMI) 03/2016   Found in 90% mid LAD lesion treated with bare-metal stent (BMS) PCI - vision BMS 3.0 mm x 18 mm   Heart murmur    History of blood transfusion    History of MRSA infection 2007   Hyperlipidemia     Hypertension    IDA (iron deficiency anemia)    Insulin  dependent type 2 diabetes mellitus (HCC)    Iron deficiency anemia    takes iron supplement   Myocardial infarction [facility])    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 30 tablet 0   albuterol  (PROVENTIL  HFA) 108 (90 Base) MCG/ACT inhaler Inhale 2 puffs into the lungs every 4 (four) hours as needed for coughing, wheezing, or shortness of breath. 17 g 2   alum & mag hydroxide-simeth (MAALOX/MYLANTA) 200-200-20 MG/5ML suspension Take 15 mLs by mouth every 4 (four) hours as needed for indigestion or heartburn. 355 mL 0   azelastine  (ASTELIN ) 0.1 % nasal spray Place 1 spray  into both nostrils 2 (two) times daily. Use in each nostril as directed 30 mL 1   benzonatate  (TESSALON ) 100 MG capsule Take 1 capsule (100 mg total) by mouth 3 (three) times daily as needed for cough. 21 capsule 0   cetirizine  (ZYRTEC ) 10 MG tablet Take 10 mg by mouth daily.     Continuous Glucose Sensor (DEXCOM G7 SENSOR) MISC Use it for 10 days to continuously monitor your blood sugars. 9 each 3   Dextromethorphan -guaiFENesin  10-100 MG/5ML liquid Take 5 mLs by mouth every 12 (twelve) hours. 118 mL 0   dicyclomine  (BENTYL ) 10 MG capsule Take 1 capsule (10 mg total) by mouth 3 (three) times daily before meals. 90 capsule 0   DULoxetine  (CYMBALTA ) 30 MG capsule Take  1 capsule (30 mg total) by mouth daily. 30 capsule 1   gabapentin  (NEURONTIN ) 100 MG capsule Take 1 capsule (100 mg total) by mouth at bedtime. 90 capsule 1   icosapent  Ethyl (VASCEPA ) 1 g capsule Take 2 capsules (2 g total) by mouth 2 (two) times daily. 360 capsule 3   injection device for insulin  (INPEN 100-PINK-LILLY-HUMALOG ) DEVI Use to inject Humalog  insulin  for meals and correction insulin  1 each 1   insulin  degludec (TRESIBA ) 100 UNIT/ML FlexTouch Pen Inject 24 Units into the skin at bedtime. 15 mL 1   insulin  lispro (HUMALOG ) 100 UNIT/ML cartridge Inject 6-20 Units total into the skin 3 (three) times daily with meals. Use with inpen to inject at meal time and for correction insulin  up to 40 units daily 36 mL 3   lanthanum  (FOSRENOL ) 1000 MG chewable tablet Chew 1 tablet (1,000 mg total) by mouth 3 (three) times daily with meals. 270 tablet 4   leflunomide  (ARAVA ) 10 MG tablet Take 1 tablet (10 mg total) by mouth daily. 30 tablet 2   leflunomide  (ARAVA ) 10 MG tablet Take 1 tablet (10 mg total) by mouth daily. 30 tablet 2   methocarbamol  (ROBAXIN ) 500 MG tablet Take 1 tablet (500 mg total) by mouth every 6 (six) hours as needed for muscle spasms. 20 tablet 0   metoCLOPramide  (REGLAN ) 10 MG tablet Take 1 tablet by mouth once a day as needed 30 tablet 0   metoCLOPramide  (REGLAN ) 5 MG tablet Take 1 tablet (5 mg total) by mouth every 6 (six) hours as needed. 120 tablet 0   metoprolol  tartrate (LOPRESSOR ) 25 MG tablet Take 1 tablet (25 mg total) by mouth 2 (two) times daily. Do not take the night before dialysis, and take 2 hours after dialysis. 180 tablet 0   Multiple Vitamins-Minerals (HAIR SKIN & NAILS) TABS Take 1 tablet by mouth daily.     multivitamin (RENA-VIT) TABS tablet Take 1 tablet by mouth daily. 90 tablet 3   naloxone  (NARCAN ) nasal spray 4 mg/0.1 mL Place 1 spray into the nose as needed if found unresponsive and call 911 immediately. 2 each 12   omeprazole  (PRILOSEC) 20 MG capsule  Take 1 capsule (20 mg total) by mouth daily. 30 capsule 1   oxyCODONE -acetaminophen  (PERCOCET) 10-325 MG tablet Take 1 tablet by mouth 5 (five) times daily as needed. 150 tablet 0   oxyCODONE -acetaminophen  (PERCOCET) 10-325 MG tablet Take 1 tablet by mouth 5 (five) times daily as needed. 150 tablet 0   oxyCODONE -acetaminophen  (PERCOCET) 10-325 MG tablet Take 1 tablet by mouth 5 (five) times daily as needed. 150 tablet 0   oxyCODONE -acetaminophen  (PERCOCET) 10-325 MG tablet Take 1 tablet by mouth 5 (five) times daily as needed. 150 tablet  0   polyethylene glycol (MIRALAX  / GLYCOLAX ) 17 g packet Take 17 g by mouth daily as needed for moderate constipation.     prochlorperazine  (COMPAZINE ) 10 MG tablet Take 1 tablet (10 mg total) by mouth every 6 (six) hours as needed for nausea or vomiting. 90 tablet 0   promethazine  (PHENERGAN ) 12.5 MG tablet Take 1 tablet (12.5 mg total) by mouth every 6 (six) hours as needed for nausea or vomiting. 30 tablet 0   promethazine -dextromethorphan  (PROMETHAZINE -DM) 6.25-15 MG/5ML syrup Take 5 mLs by mouth 4 (four) times daily as needed for cough. 118 mL 0   rosuvastatin  (CRESTOR ) 40 MG tablet Take 1 tablet (40 mg total) by mouth daily. 90 tablet 3   sevelamer  carbonate (RENVELA ) 800 MG tablet Take 1,600-3,200 mg by mouth See admin instructions. Take 3200 mg with each meal and 1600 mg with each snack     tirzepatide  (MOUNJARO ) 2.5 MG/0.5ML Pen Inject 2.5 mg into the skin once a week for 6 doses. 2 mL 1   [START ON 09/05/2024] tirzepatide  (MOUNJARO ) 5 MG/0.5ML Pen Inject 5 mg into the skin once a week for 8 doses. 2 mL 1   No current facility-administered medications for this visit.   Family History  Problem Relation Age of Onset   Stroke Mother    Diabetes Mother    Hypertension Mother    Aneurysm Mother    Diabetes Father    Hypertension Father    Diabetes Sister    Hypertension Sister    Diabetes Maternal Grandmother    Diabetes Maternal Grandfather    Cancer  Paternal Grandfather        Prostate   Social History   Socioeconomic History   Marital status: Single    Spouse name: Not on file   Number of children: 2   Years of education: 13   Highest education level: Not on file  Occupational History   Occupation:   Network engineer: [employer]  Tobacco Use   Smoking status: Former    Current packs/day: 0.00    Types: Cigarettes    Quit date: 02/28/2015    Years since quitting: 9.4   Smokeless tobacco: Never  Vaping Use   Vaping status: Never Used  Substance and Sexual Activity   Alcohol use: Not Currently    Comment: ~ 1 a month   Drug use: Not Currently    Comment: last use 04-22-2021   Sexual activity: Yes    Birth control/protection: Surgical  Other Topics Concern   Not on file  Social History Narrative   Patient does not drink caffeine.   Patient is right handed.       --> 2020 was a difficult year: Her sister died in 2024/04/22 (potentially because of COVID-19), her recent ex-boyfriend died in either Mar 23, 2024 or 04/22/24, this was followed by one of her close friends being murdered by the boyfriend.    --> Good news for 2020 was that she had a new grandkids.   Social Drivers of Corporate investment banker Strain: High Risk (06/26/2023)   Overall Financial Resource Strain (CARDIA)    Difficulty of Paying Living Expenses: Very hard  Food Insecurity: No Food Insecurity (11/14/2023)   Hunger Vital Sign    Worried About Running Out of Food in the Last Year: Never true    Ran Out of Food in the Last Year: Never true  Transportation Needs: Unmet Transportation Needs (11/15/2023)   PRAPARE - Transportation  Lack of Transportation (Medical): Yes    Lack of Transportation (Non-Medical): Yes  Physical Activity: Sufficiently Active (06/26/2023)   Exercise Vital Sign    Days of Exercise per Week: 4 days    Minutes of Exercise per Session: 40 min  Stress: Stress Concern Present (06/26/2023)   [employer] of Occupational Health -  Occupational Stress Questionnaire    Feeling of Stress : Very much  Social Connections: Moderately Isolated (06/26/2023)   Social Connection and Isolation Panel    Frequency of Communication with Friends and Family: More than three times a week    Frequency of Social Gatherings with Friends and Family: More than three times a week    Attends Religious Services: 1 to 4 times per year    Active Member of [employer] or Organizations: No    Attends Banker Meetings: Never    Marital Status: Divorced   Review of Systems: Pertinent items are noted in HPI. Objective:   Vitals:   07/31/24 1030 07/31/24 1034  BP: (!) 167/74 (!) 152/71  Pulse: 89 88  Temp: 98.2 F (36.8 C)   TempSrc: Oral   SpO2: 100%   Weight: 197 lb 3.2 oz (89.4 kg)   Height: 5' 5 (1.651 m)     Physical Exam: GEN: NAD. Oriented x 3, normal mood and affect  HEENT: Normocephalic, atraumatic, Conjunctiva clear, sclera non-icteric CV: RRR, no M/R/G PULM: diminished breath sounds b/l, no expiratory wheezing auscultated NEURO: moving all extremities spontaneously in upper and lower extremities. PSYCH: Oriented X3, intact recent and remote memory, judgment and insight, normal mood and affect.   Assessment & Plan:   Assessment & Plan Productive cough Will obtain CXR to rule out post-viral PNA. It is possible that her present sxs are in the setting of an acute asthma exacerbation especially in light of her wheezing (she reports this is intermittent and I was unable to auscultate on exam today) and persistent SOB. What seems to be more likely based on her exam is viral bronchitis, however since her symptoms are interfering with her functioning/work, it is reasonable to trial a course of prednisone  for presumed asthma exacerbation to see if she has any symptomatic improvement as she has now failed a trial of supportive care. - prednisone  40 mg x5 days - continue albuterol  PRN - follow up CXR  Mild intermittent asthma  with acute exacerbation See above. - prednisone  40 mg x5d, cont albuterol  PRN - at her next visit, recommend formal spirometry for further evaluation of possible obstructive airway disease Essential hypertension BP elevated in office, suspect in setting of acute illness.   RTC 8 weeks or PRN if no improvement.   Jone Dauphin MD

## 2024-08-14 ENCOUNTER — Ambulatory Visit: Admitting: Vascular Surgery

## 2024-08-21 ENCOUNTER — Other Ambulatory Visit (HOSPITAL_COMMUNITY): Payer: Self-pay

## 2024-08-21 MED ORDER — OXYCODONE-ACETAMINOPHEN 10-325 MG PO TABS
1.0000 | ORAL_TABLET | Freq: Every day | ORAL | 0 refills | Status: AC
  Filled 2024-08-27: qty 150, 30d supply, fill #0

## 2024-08-23 ENCOUNTER — Other Ambulatory Visit: Payer: Self-pay | Admitting: Student

## 2024-08-23 ENCOUNTER — Other Ambulatory Visit (HOSPITAL_COMMUNITY): Payer: Self-pay

## 2024-08-23 ENCOUNTER — Encounter (HOSPITAL_COMMUNITY): Payer: Self-pay

## 2024-08-23 DIAGNOSIS — G9331 Postviral fatigue syndrome: Secondary | ICD-10-CM

## 2024-08-23 MED ORDER — PROMETHAZINE-DM 6.25-15 MG/5ML PO SYRP: 5.0000 mL | ORAL_SOLUTION | Freq: Four times a day (QID) | ORAL | 0 refills | Status: DC | PRN

## 2024-08-23 MED ORDER — PROMETHAZINE-DM 6.25-15 MG/5ML PO SYRP
5.0000 mL | ORAL_SOLUTION | Freq: Four times a day (QID) | ORAL | 0 refills | Status: DC | PRN
Start: 2024-08-23 — End: 2024-10-02
  Filled 2024-08-23: qty 118, 6d supply, fill #0

## 2024-08-23 NOTE — Telephone Encounter (Signed)
 Pt called /informed of rx for cough syrup. Pt's appreciative.

## 2024-08-23 NOTE — Telephone Encounter (Signed)
 Copied from CRM [id number]. Topic: Clinical - Prescription Issue >> Aug 23, 2024  8:50 AM Cherylann RAMAN wrote: Reason for CRM: Patient called requesting a different cough medication as the one that was prescribed is not working. She stated the one that was prescribed is for diabetics, however, she is needing the one for asthmatic patients. She is requesting a asthmatic cough syrup. She has been prescribed it once before but can not remember the name but it worked really well for her.  Please contact patient for additional information [phone].

## 2024-08-23 NOTE — Telephone Encounter (Signed)
 Called pt who stated she continues to have a dry cough. LOV was 07/31/24. Stated she has taken Tessalon  and a cough syrup (she does not know the name but it's for asthma) before and it worked well. Asking if the doctor could prescribe her something.

## 2024-08-23 NOTE — Addendum Note (Signed)
 Addended by: TOBIE GAINES on: 08/23/2024 12:00 PM   Modules accepted: Orders

## 2024-08-27 ENCOUNTER — Other Ambulatory Visit (HOSPITAL_COMMUNITY): Payer: Self-pay

## 2024-09-10 ENCOUNTER — Ambulatory Visit
Admission: RE | Admit: 2024-09-10 | Discharge: 2024-09-10 | Disposition: A | Source: Ambulatory Visit | Attending: Internal Medicine | Admitting: Internal Medicine

## 2024-09-10 ENCOUNTER — Other Ambulatory Visit: Payer: Self-pay | Admitting: Internal Medicine

## 2024-09-10 DIAGNOSIS — R921 Mammographic calcification found on diagnostic imaging of breast: Secondary | ICD-10-CM

## 2024-09-11 ENCOUNTER — Other Ambulatory Visit: Payer: Self-pay

## 2024-09-11 ENCOUNTER — Other Ambulatory Visit (HOSPITAL_COMMUNITY): Payer: Self-pay

## 2024-09-11 MED ORDER — AMOXICILLIN-POT CLAVULANATE 500-125 MG PO TABS
1.0000 | ORAL_TABLET | Freq: Every day | ORAL | 0 refills | Status: DC
  Filled 2024-09-11: qty 7, 7d supply, fill #0

## 2024-09-11 MED ORDER — AMLODIPINE BESYLATE 5 MG PO TABS
5.0000 mg | ORAL_TABLET | Freq: Every day | ORAL | 3 refills | Status: DC
  Filled 2024-09-11: qty 30, 30d supply, fill #0

## 2024-09-12 ENCOUNTER — Ambulatory Visit
Admission: RE | Admit: 2024-09-12 | Discharge: 2024-09-12 | Disposition: A | Discharge status: Home / Self Care | Source: Ambulatory Visit | Attending: Internal Medicine | Admitting: Internal Medicine

## 2024-09-12 ENCOUNTER — Encounter (HOSPITAL_COMMUNITY): Payer: Self-pay

## 2024-09-12 DIAGNOSIS — R921 Mammographic calcification found on diagnostic imaging of breast: Secondary | ICD-10-CM

## 2024-09-12 HISTORY — PX: BREAST BIOPSY: SHX20

## 2024-09-13 LAB — SURGICAL PATHOLOGY

## 2024-09-18 ENCOUNTER — Other Ambulatory Visit (HOSPITAL_COMMUNITY): Payer: Self-pay

## 2024-09-18 MED ORDER — AMLODIPINE BESYLATE 10 MG PO TABS
10.0000 mg | ORAL_TABLET | Freq: Every evening | ORAL | 3 refills | Status: DC
  Filled 2024-09-18: qty 90, 90d supply, fill #0

## 2024-09-19 ENCOUNTER — Other Ambulatory Visit (HOSPITAL_COMMUNITY): Payer: Self-pay

## 2024-09-19 MED ORDER — OXYCODONE-ACETAMINOPHEN 10-325 MG PO TABS
1.0000 | ORAL_TABLET | Freq: Every day | ORAL | 0 refills | Status: DC | PRN
  Filled 2024-09-19 – 2024-09-25 (×2): qty 150, 32d supply, fill #0
  Filled 2024-09-26: qty 150, 30d supply, fill #0

## 2024-09-23 ENCOUNTER — Other Ambulatory Visit (HOSPITAL_COMMUNITY): Payer: Self-pay

## 2024-09-25 ENCOUNTER — Other Ambulatory Visit (HOSPITAL_COMMUNITY): Payer: Self-pay

## 2024-09-26 ENCOUNTER — Other Ambulatory Visit (HOSPITAL_COMMUNITY): Payer: Self-pay

## 2024-10-02 ENCOUNTER — Other Ambulatory Visit (HOSPITAL_COMMUNITY): Payer: Self-pay

## 2024-10-02 ENCOUNTER — Other Ambulatory Visit: Payer: Self-pay

## 2024-10-02 ENCOUNTER — Other Ambulatory Visit: Payer: Self-pay | Admitting: Dietician

## 2024-10-02 ENCOUNTER — Ambulatory Visit (INDEPENDENT_AMBULATORY_CARE_PROVIDER_SITE_OTHER)

## 2024-10-02 ENCOUNTER — Ambulatory Visit (INDEPENDENT_AMBULATORY_CARE_PROVIDER_SITE_OTHER): Admitting: Dietician

## 2024-10-02 VITALS — BP 125/79 | HR 88 | Temp 97.8°F | Ht 65.0 in | Wt 191.8 lb

## 2024-10-02 DIAGNOSIS — E113413 Type 2 diabetes mellitus with severe nonproliferative diabetic retinopathy with macular edema, bilateral: Secondary | ICD-10-CM

## 2024-10-02 DIAGNOSIS — Z794 Long term (current) use of insulin: Secondary | ICD-10-CM

## 2024-10-02 DIAGNOSIS — F321 Major depressive disorder, single episode, moderate: Secondary | ICD-10-CM

## 2024-10-02 DIAGNOSIS — J4520 Mild intermittent asthma, uncomplicated: Secondary | ICD-10-CM

## 2024-10-02 DIAGNOSIS — G9331 Postviral fatigue syndrome: Secondary | ICD-10-CM

## 2024-10-02 DIAGNOSIS — E119 Type 2 diabetes mellitus without complications: Secondary | ICD-10-CM | POA: Diagnosis not present

## 2024-10-02 DIAGNOSIS — Z833 Family history of diabetes mellitus: Secondary | ICD-10-CM

## 2024-10-02 DIAGNOSIS — Z79899 Other long term (current) drug therapy: Secondary | ICD-10-CM

## 2024-10-02 DIAGNOSIS — Z87891 Personal history of nicotine dependence: Secondary | ICD-10-CM

## 2024-10-02 MED ORDER — DULOXETINE HCL 30 MG PO CPEP
30.0000 mg | ORAL_CAPSULE | Freq: Every day | ORAL | 3 refills | Status: AC
  Filled 2024-10-02 – 2024-10-31 (×2): qty 90, 90d supply, fill #0

## 2024-10-02 MED ORDER — ALBUTEROL SULFATE HFA 108 (90 BASE) MCG/ACT IN AERS
2.0000 | INHALATION_SPRAY | RESPIRATORY_TRACT | 4 refills | Status: AC | PRN
  Filled 2024-10-02: qty 13.4, 50d supply, fill #0

## 2024-10-02 NOTE — Assessment & Plan Note (Signed)
 Patient reports that she was previously on duloxetine  30 mg which helped her depression symptoms.  She has not not been taking this for some time and would like to resume this.  Duloxetine  was previously chosen as it is not dialyzed and she has ESRD on HD. -Refill duloxetine  30 mg daily

## 2024-10-02 NOTE — Assessment & Plan Note (Signed)
 Patient last seen by Dr.Juberg on 07/24/2024.  At that time A1c was 6.8.  She was discontinued from insulin  therapy and switch to Mounjaro  2.5 mg weekly injection with a plan to increase to the next dose after 8 weeks.  Today she reports that she discontinued the insulin , but she did not start the Mounjaro  and she plans to begin this after Thanksgiving.  He also reports that she has been having difficulties with her CGM, stating that it only last for about 7 or 8 days.  I will plan to have her see Arland after today's visit with me, and I would like for her to come back in 1 month for repeat A1c and diabetic foot exam at that time.  - Start Mounjaro  2.5 mg injection weekly for 8 weeks before increasing - f/u in 1 month for A1C recheck and diabetic foot exam

## 2024-10-02 NOTE — Patient Instructions (Signed)
 Thank you, Ms.Arneda L Madera for allowing us  to provide your care today. Today we discussed the following:  - I would like to see you back in 1 month to recheck your A1C and see how Mounjaro  has been going for you - -     I have ordered the following medication/changed the following medications:   Stop the following medications: Medications Discontinued During This Encounter  Medication Reason   amLODipine  (NORVASC ) 5 MG tablet Completed Course   amLODipine  (NORVASC ) 10 MG tablet Completed Course   amoxicillin -clavulanate (AUGMENTIN ) 500-125 MG tablet Completed Course   benzonatate  (TESSALON ) 100 MG capsule Completed Course   Dextromethorphan -guaiFENesin  10-100 MG/5ML liquid Completed Course   insulin  degludec (TRESIBA ) 100 UNIT/ML FlexTouch Pen    insulin  lispro (HUMALOG ) 100 UNIT/ML cartridge    promethazine -dextromethorphan  (PROMETHAZINE -DM) 6.25-15 MG/5ML syrup    leflunomide  (ARAVA ) 10 MG tablet    metoCLOPramide  (REGLAN ) 5 MG tablet      Start the following medications: No orders of the defined types were placed in this encounter.    Follow up: 1 month    Remember:   Should you have any questions or concerns please call the Internal Medicine Clinic at [phone].     Doyal Miyamoto, MD [facility]

## 2024-10-02 NOTE — Progress Notes (Signed)
 Patient name: Jane [name] Date of birth: November 21, 1975 Date of visit: 10/02/24  Type of visit: Established Patient Office Visit   Subjective   Chief concern:  Chief Complaint  Patient presents with   Follow-up    Routine office for 2 month follow up / feeling better / dexcon G7 is only 8 days -requesting something for at least 10-14 days.    Jane [name] is a 48 y.o. female with a history of CAD c/b NSTEMI s/p BMS PCI to pLAD 2017, T2DM, ESRD on HD, HTN, HLD, and mild intermittent asthma who presents to [facility] clinic with complaints that her Dexcon G7 is only lasting 7-8 days.  Today patient reports concerns of her Dexcom G7 lasting only 7 to 8 days.  [facility] by Dr. Harrie.  At that time she was discontinued from her insulin  therapy and advised to start Mounjaro  2.5 mg weekly injection.  Today she reports that she did not start this and would like to begin after Thanksgiving.  She denies episodes of hypoglycemia.  She was also seen by Dr. Shawn on 07/31/2024 with concerns of URI symptoms and possible asthma exacerbation.  She states that her symptoms cleared up and she is not having any trouble breathing at this time.  She is requesting a refill of her albuterol  inhaler.  Patient reports that she was previously on Cymbalta  30 mg daily for depression and states that this was helpful for her.  She would like to resume this and is asked for refill.  Patient also states that she was referred for sleep study and she is undergoing workup for possible OSA.  ROS: Denies headaches, dizziness, fever, chills, runny nose, sore throat, vision changes, hearing changes, chest pain, shortness of breath, difficulty breathing, nausea, vomiting, abdominal pain. Denies increased urinary frequency, pain with urination, constipation or diarrhea. No recent falls.   Patient Active Problem List   Diagnosis Date Noted   Intramural leiomyoma of uterus 11/13/2023    Endometrial stripe increased 11/13/2023   Encounter for perioperative consultation 06/26/2023   Abnormal hematology test result 04/12/2023   Hematuria 05/26/2022   Irritable bowel syndrome (IBS) 06/03/2021   Diabetic neuropathy (HCC) 01/29/2021   Hypertensive heart disease with chronic diastolic congestive heart failure (HCC) 01/06/2021   Osteoarthritis of left knee 02/11/2019   Nausea in adult 11/30/2018   Hidradenitis suppurativa 02/12/2018   Anemia in chronic kidney disease 02/12/2018   Secondary hyperparathyroidism of renal origin 01/30/2018   Major depressive disorder 08/11/2017   Sinus tachycardia 02/23/2017   ESRD (end stage renal disease) on dialysis (HCC) 01/05/2017   CAD S/P BMS PCI to prox LAD 03/31/2016   Abnormal uterine bleeding (AUB) 03/17/2016   PAOD (peripheral arterial occlusive disease) 01/08/2016   Charcot foot due to diabetes mellitus (HCC) 09/07/2015   Essential hypertension 08/16/2014   Seasonal allergic rhinitis 08/16/2014   Viral upper respiratory illness 08/05/2014   Morbid obesity (HCC) 09/26/2012   Hyperlipidemia associated with type 2 diabetes mellitus (HCC) 02/26/2009   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, with long-term current use of insulin  (HCC) 05/08/2007   Asthma 05/08/2007     Past Surgical History:  Procedure Laterality Date   A/V FISTULAGRAM N/A 04/13/2021   Procedure: A/V FISTULAGRAM - Right Arm;  Surgeon: Serene Gaile ORN, MD;  Location: [facility];  Service: Cardiovascular;  Laterality: N/A;   A/V SHUNT INTERVENTION Right 05/30/2024   Procedure: A/V SHUNT  INTERVENTION;  Surgeon: Sheree Penne Bruckner, MD;  Location: [facility];  Service: Cardiovascular;  Laterality: Right;   ACHILLES TENDON LENGTHENING Left 11/24/2016   Procedure: Left Achilles tendon lengthening (open);  Surgeon: Norleen Armor, MD;  Location: [facility];  Service: Orthopedics;  Laterality: Left;   AV FISTULA  PLACEMENT Right 07/31/2017   Procedure: ARTERIOVENOUS BRACHIOCEPHALIC FISTULA CREATION RIGHT ARM;  Surgeon: Laurence Redell CROME, MD;  Location: [facility] OR;  Service: Vascular;  Laterality: Right;   BREAST BIOPSY Right 09/12/2024   MM RT BREAST BX W LOC DEV 1ST LESION IMAGE BX SPEC STEREO GUIDE 09/12/2024 [facility]   CALCANEAL OSTEOTOMY Left 11/24/2016   Procedure: Left hindfoot osteotomy and fusion;  Surgeon: Norleen Armor, MD;  Location: [facility];  Service: Orthopedics;  Laterality: Left;   CARDIAC CATHETERIZATION N/A 03/31/2016   Procedure: Left Heart Cath and Coronary Angiography;  Surgeon: Dorn JINNY Lesches, MD;  Location: [facility] INVASIVE CV LAB: 90% early mLAD, normal LV Fxn   CARDIAC CATHETERIZATION N/A 03/31/2016   Procedure: Coronary Stent Intervention;  Surgeon: Dorn JINNY Lesches, MD;  Location: [facility];  Service: Cardiovascular: PCI to mLAD BMS Vision 3.0 mm x 18 mm   CESAREAN SECTION  1997   CYSTOSCOPY N/A 11/13/2023   Procedure: CYSTOSCOPY;  Surgeon: Jeralyn Crutch, MD;  Location: [facility] OR;  Service: Gynecology;  Laterality: N/A;   CYSTOSCOPY WITH FULGERATION N/A 08/30/2022   Procedure: CYSTOSCOPY WITH FULGERATION, BIOPSY, LEFT RETROGRADE PYELOGRAM;  Surgeon: Elisabeth Valli BIRCH, MD;  Location: [facility];  Service: Urology;  Laterality: N/A;   DILITATION & CURRETTAGE/HYSTROSCOPY WITH NOVASURE ABLATION N/A 09/15/2020   Procedure: DILATATION & CURETTAGE/HYSTEROSCOPY;  Surgeon: Corene Coy, MD;  Location: [facility] [place];  Service: Gynecology;  Laterality: N/A;   DILITATION & CURRETTAGE/HYSTROSCOPY WITH NOVASURE ABLATION N/A 12/21/2021   Procedure: DILATATION & CURETTAGE/HYSTEROSCOPY WITH NOVASURE ABLATION/ IUD REMOVAL;  Surgeon: Corene Coy, MD;  Location: [facility] [place];  Service: Gynecology;  Laterality: N/A;   FISTULA SUPERFICIALIZATION Right 11/15/2017   Procedure: FISTULA SUPERFICIALIZATION RIGHT BRACHIOCEPHALIC  RIGHT;   Surgeon: Laurence Redell CROME, MD;  Location: [facility] OR;  Service: Vascular;  Laterality: Right;   FOOT SURGERY     multiple for charcot   PERIPHERAL VASCULAR BALLOON ANGIOPLASTY Right 04/13/2021   Procedure: PERIPHERAL VASCULAR BALLOON ANGIOPLASTY;  Surgeon: Serene Gaile ORN, MD;  Location: [facility];  Service: Cardiovascular;  Laterality: Right;  arm fistula   TOTAL KNEE ARTHROPLASTY Left 07/19/2023   Procedure: TOTAL KNEE ARTHROPLASTY;  Surgeon: Fidel Redell, MD;  Location: [facility];  Service: Orthopedics;  Laterality: Left;  150   TOTAL LAPAROSCOPIC HYSTERECTOMY WITH SALPINGECTOMY Bilateral 11/13/2023   Procedure: TOTAL LAPAROSCOPIC HYSTERECTOMY WITH SALPINGECTOMY;  Surgeon: Jeralyn Crutch, MD;  Location: [facility] OR;  Service: Gynecology;  Laterality: Bilateral;   TRANSTHORACIC ECHOCARDIOGRAM  02/2021   EF 55 to 60%.  No R WMA.  Mild septal LVH with moderate posterior hypertrophy.  Normal RV size and function.  Normal RVP/PASP.[name]  Trivial MR.   TUBAL LIGATION  1997     Current Outpatient Medications  Medication Instructions   acetaminophen  (TYLENOL ) 1,000 mg, Oral, Every 8 hours PRN   albuterol  (PROVENTIL  HFA) 108 (90 Base) MCG/ACT inhaler Inhale 2 puffs into the lungs every 4 (four) hours as needed for coughing, wheezing, or shortness of breath.   alum & mag hydroxide-simeth (MAALOX/MYLANTA) 200-200-20 MG/5ML suspension 15 mLs, Oral, Every 4 hours PRN   azelastine  (ASTELIN ) 0.1 % nasal  spray 1 spray, Each Nare, 2 times daily, Use in each nostril as directed   cetirizine  (ZYRTEC ) 10 mg, Daily   Continuous Glucose Sensor (DEXCOM G7 SENSOR) MISC Use it for 10 days to continuously monitor your blood sugars.   dicyclomine  (BENTYL ) 10 mg, Oral, 3 times daily before meals   DULoxetine  (CYMBALTA ) 30 mg, Oral, Daily   fluticasone  (FLONASE ) 50 MCG/ACT nasal spray 1 spray, Each Nare, Daily   gabapentin  (NEURONTIN ) 100 mg, Oral, Daily at bedtime   injection device for insulin  (INPEN  100-PINK-LILLY-HUMALOG ) DEVI Use to inject Humalog  insulin  for meals and correction insulin    lanthanum  (FOSRENOL ) 1000 MG chewable tablet Chew 1 tablet (1,000 mg total) by mouth 3 (three) times daily with meals.   leflunomide  (ARAVA ) 10 mg, Oral, Daily   methocarbamol  (ROBAXIN ) 500 MG tablet Take 1 tablet (500 mg total) by mouth every 6 (six) hours as needed for muscle spasms.   metoCLOPramide  (REGLAN ) 10 MG tablet Take 1 tablet by mouth once a day as needed   metoprolol  tartrate (LOPRESSOR ) 25 mg, Oral, 2 times daily, Do not take the night before dialysis, and take 2 hours after dialysis.   Mounjaro  5 mg, Subcutaneous, Weekly   Multiple Vitamins-Minerals (HAIR SKIN & NAILS) TABS 1 tablet, Daily   multivitamin (RENA-VIT) TABS tablet 1 tablet, Oral, Daily   naloxone  (NARCAN ) nasal spray 4 mg/0.1 mL Place 1 spray into the nose as needed if found unresponsive and call 911 immediately.   omeprazole  (PRILOSEC) 20 mg, Oral, Daily   oxyCODONE -acetaminophen  (PERCOCET) 10-325 MG tablet Take 1 tablet by mouth 5 (five) times daily as needed.   oxyCODONE -acetaminophen  (PERCOCET) 10-325 MG tablet 1 tablet, Oral, 5 times daily PRN   oxyCODONE -acetaminophen  (PERCOCET) 10-325 MG tablet 1 tablet, Oral, 5 times daily PRN   oxyCODONE -acetaminophen  (PERCOCET) 10-325 MG tablet Take 1 tablet by mouth 5 (five) times daily as needed.   oxyCODONE -acetaminophen  (PERCOCET) 10-325 MG tablet 1 tablet, Oral, 5 times daily PRN   polyethylene glycol (MIRALAX  / GLYCOLAX ) 17 g, Daily PRN   prochlorperazine  (COMPAZINE ) 10 mg, Oral, Every 6 hours PRN   promethazine  (PHENERGAN ) 12.5 mg, Oral, Every 6 hours PRN   rosuvastatin  (CRESTOR ) 40 mg, Oral, Daily   sevelamer  carbonate (RENVELA ) 1,600-3,200 mg, See admin instructions   Vascepa  2 g, Oral, 2 times daily    Social History   Tobacco Use   Smoking status: Former    Current packs/day: 0.00    Types: Cigarettes    Quit date: 02/28/2015    Years since quitting: 9.6    Smokeless tobacco: Never  Vaping Use   Vaping status: Never Used  Substance Use Topics   Alcohol use: Not Currently    Comment: ~ 1 a month   Drug use: Not Currently    Comment: last use 03/2021      Objective  Today's Vitals   10/02/24 1409 10/02/24 1421  BP: (!) 152/88 125/79  Pulse: 88 88  Temp: 97.8 F (36.6 C)   TempSrc: Oral   SpO2: 98%   Weight: 191 lb 12.8 oz (87 kg)   Height: 5' 5 (1.651 m)   PainSc: 4    PainLoc: Knee   Body mass index is 31.92 kg/m.   Physical Exam:   Constitutional: well-appearing female sitting in exam chair, in no acute distress. Ambulates without use of assistance device  HEENT: normocephalic atraumatic, mucous membranes moist Eyes: conjunctiva non-erythematous Neck: supple Cardiovascular: regular rate and rhythm Pulmonary/Chest: normal work of breathing on room  air, lungs clear to auscultation bilaterally Abdominal: soft, non-tender, non-distended MSK: normal bulk and tone. Neurological: alert & oriented x 3 Skin: warm and dry Psych: mood calm, behavior normal, thought content normal, judgement normal      The ASCVD Risk score (Arnett DK, et al., 2019) failed to calculate for the following reasons:   Risk score cannot be calculated because patient has a medical history suggesting prior/existing ASCVD      Assessment & Plan  Problem List Items Addressed This Visit       Respiratory   Asthma - Primary   Patient reports resolution of her URI symptoms and breathing difficulties since her last visit on 07/31/2024 with Dr. Shawn.  Patient is requesting a refill of her albuterol  inhaler which I will send today.  PFTs were deferred back in September as she was in an acute flare, though we will plan to order formal PFT testing now. 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  At that time A1c was 6.8.  She was discontinued from insulin  therapy and switch to Mounjaro  2.5 mg weekly injection with a plan to increase to the next dose after 8 weeks.  Today she reports that she discontinued the insulin , but she did not start the Mounjaro  and she plans to begin this after Thanksgiving.  He also reports that she has been having difficulties with her CGM, stating that it only last for about 7 or 8 days.  I will plan to have her see Arland after today's visit with me, and I would like for her to come back in 1 month for repeat A1c and diabetic foot exam at that time.  - Start Mounjaro  2.5 mg injection weekly for 8 weeks before increasing - f/u in 1 month for A1C recheck and diabetic foot exam         Other   Major depressive disorder (Chronic)   Patient reports that she was previously on duloxetine  30 mg which helped her depression symptoms.  She has not not been taking this for some time and would like to resume this.  Duloxetine  was previously chosen as it is not dialyzed and she has ESRD on HD. -Refill duloxetine  30 mg daily      Relevant Medications   DULoxetine  (CYMBALTA ) 30 MG capsule   Other Visit Diagnoses       Postviral syndrome           Return in about 1 month (around 11/01/2024) for A1C recheck.  Patient discussed with Dr. Shawn.  Doyal Miyamoto, MD [facility]  PGY-1 10/02/2024, 5:36 PM

## 2024-10-02 NOTE — Progress Notes (Signed)
 Internal Medicine Clinic Attending  Case discussed with the resident at the time of the visit.  We reviewed the resident's history and exam and pertinent patient test results.  I agree with the assessment, diagnosis, and plan of care documented in the resident's note.

## 2024-10-02 NOTE — Telephone Encounter (Signed)
 Requests a meter and supplies

## 2024-10-02 NOTE — Assessment & Plan Note (Signed)
 Patient reports resolution of her URI symptoms and breathing difficulties since her last visit on 07/31/2024 with Dr. Shawn.  Patient is requesting a refill of her albuterol  inhaler which I will send today.  PFTs were deferred back in September as she was in an acute flare, though we will plan to order formal PFT testing now. - Albuterol  inhaler refilled - ordered PFTs

## 2024-10-03 ENCOUNTER — Other Ambulatory Visit (HOSPITAL_COMMUNITY): Payer: Self-pay

## 2024-10-03 MED ORDER — ACCU-CHEK GUIDE TEST VI STRP
ORAL_STRIP | 3 refills | Status: AC
  Filled 2024-10-03: qty 100, 100d supply, fill #0

## 2024-10-03 MED ORDER — ACCU-CHEK GUIDE W/DEVICE KIT
PACK | 1 refills | Status: AC
  Filled 2024-10-03: qty 1, 30d supply, fill #0

## 2024-10-03 MED ORDER — ACCU-CHEK SOFTCLIX LANCETS MISC
3 refills | Status: AC
  Filled 2024-10-03: qty 100, 100d supply, fill #0

## 2024-10-03 NOTE — Patient Instructions (Addendum)
 Hi Mikhia,  I'd like to follow up in about 2-3 weeks if you want my assistance with controlling your blood sugars using your Continuous glucose monitoring. 3- 6 months at the latest.   Here is some information about the Mounjaro :   Starting a GLP-1 RA Medication  Keep in the refrigerator most of the time. (Can stay at room temperature for only 21 days)  Rotate injection sites.  Food Portions: Eat smaller portions than usual. Consider using a smaller plate or bowl. Eat more slowly. Stop eating at the first sign of fullness.   Meal Timing: Eat regularly, don't skip meals. Do not rely on hunger as a signal to eat.  Nutrient Quality: Choose and eat healthy foods Eat protein with each meal and snack. Consider eating protein first. Limit high fat,greasy foods. Limit spicy foods.  Decrease Nausea, Vomiting: On injection day, have largest meal before taking the medication. Eat small, frequent low fat meals Drink fluids per your kidney program: Best if not carbonated and do not contain caffeine  Constipation: Assure adequate fiber. ( 25-39 grams/day) Eat a variety of vegetables, fruits, whole grains and beans Increase physical activity  Can use half dose metamucil daily to prevent constipation; it's always best to obtain fiber from food.  I've included information about fiber in the kidney diet.

## 2024-10-03 NOTE — Progress Notes (Signed)
 Diabetes Self-Management Education  Visit Type: Annual Follow-Up (last visit 03/13/2023)  Appt. Start Time: 315 Appt. End Time: 4015  10/03/2024  [name], identified by name and date of birth, is a 48 y.o. female with a diagnosis of Diabetes:  .   ASSESSMENT  .Patient reports intermittent nausea, non-specific abdominal discomfort. She also has intermittent diarrhea and constipation. Currently constipated per patient due to taking full dose imodium . Discussed starting tirzepatide  at lowest possible dose with Dr. Nguyen to encourage tolerance.  She was given a sample Dexcom G7 today (states she thinks she has 3 of these at home and I also gave her a coupon for another free one) to show her how and where to apply the sensors so they last the full 10.5 days. We used skin tac over her patch and under a second patch.  She verbalized understanding of the importance of cleaning the site and how and where to apply the sensor to it's operation. She is remotely sharing her CGM data with us .   Estimated body mass index is 31.92 kg/m as calculated from the following:   Height as of an earlier encounter on 10/02/24: 5' 5 (1.651 m).   Weight as of an earlier encounter on 10/02/24: 191 lb 12.8 oz (87 kg). Wt Readings from Last 10 Encounters:  10/02/24 191 lb 12.8 oz (87 kg)  07/31/24 197 lb 3.2 oz (89.4 kg)  07/31/24 200 lb (90.7 kg)  07/24/24 197 lb 9.6 oz (89.6 kg)  06/27/24 204 lb 12.9 oz (92.9 kg)  03/29/24 204 lb 12.8 oz (92.9 kg)  01/12/24 204 lb (92.5 kg)  12/01/23 201 lb (91.2 kg)  11/14/23 189 lb 6 oz (85.9 kg)  11/06/23 206 lb 8 oz (93.7 kg)   Weight decreased intentionally per patient.  Lab Results  Component Value Date   HGBA1C 6.8 07/24/2024   HGBA1C 6.9 (H) 11/06/2023   HGBA1C 7.7 (H) 07/07/2023   HGBA1C 7.6 (H) 06/16/2023   HGBA1C 7.8 (A) 03/13/2023    A1c may be falsely lower due to dialysis   Diabetes Self-Management Education - 10/02/24 1600       Visit  Information   Visit Type Annual Follow-Up   last visit 03/13/2023     Health Coping   How would you rate your overall health? Good      Psychosocial Assessment   Patient Belief/Attitude about Diabetes Motivated to manage diabetes    What is the hardest part about your diabetes right now, causing you the most concern, or is the most worrisome to you about your diabetes?   Making healty food and beverage choices;Taking/obtaining medications;Checking blood sugar;Being active    Self-care barriers Lack of material resources;Debilitated state due to current medical condition    Self-management support Friends;Family;Doctor's office;CDE visits    Patient Concerns Monitoring;Support    Special Needs None    Preferred Learning Style No preference indicated    Learning Readiness Contemplating    How often do you need to have someone help you when you read instructions, pamphlets, or other written materials from your doctor or pharmacy? 2 - Rarely    What is the last grade level you completed in school? 12      Pre-Education Assessment   Patient understands monitoring blood glucose, interpreting and using results Needs Review    Patient understands how to develop strategies to address psychosocial issues. Comprehends key points      Complications   Last HgB A1C per patient/outside source 6.8 %  How often do you check your blood sugar? 0 times/day (not testing)    Fasting Blood glucose range (mg/dL) --   unknown   Postprandial Blood glucose range (mg/dL) --   unknown   Number of hypoglycemic episodes per month 0    Number of hyperglycemic episodes ( >200mg /dL): --   unknown   Have you had a dilated eye exam in the past 12 months? Yes    Have you had a dental exam in the past 12 months? No    Are you checking your feet? Yes    How many days per week are you checking your feet? 7      Dietary Intake   Beverage(s) sugar free drinks or water , states she has a 32 ounce fluid restriction       Activity / Exercise   Activity / Exercise Type Light (walking / raking leaves);ADL's    How many days per week do you exercise? 5    How many minutes per day do you exercise? 30    Total minutes per week of exercise 150      Patient Education   Previous Diabetes Education Yes    Monitoring Taught/evaluated CGM (comment)   a free sample dexcom G7 was givine to patient and she was coached on how to make it stick and last for 10.5 days. her blood sugar was 134 today in office after warm up ended   Diabetes Stress and Support --   listened to patient express her feelings about self managing her diabetes and chronic kideny disease     Individualized Goals (developed by patient)   Monitoring  Consistenly use CGM   as long as she has sensors, then she will need to use a meter which was requested for her     Patient Self-Evaluation of Goals - Patient rates self as meeting previously set goals (% of time)   Monitoring < 25% (hardly ever/never)      Post-Education Assessment   Patient understands monitoring blood glucose, interpreting and using results Comprehends key points      Outcomes   Expected Outcomes Demonstrated interest in learning but significant barriers to change    Future DMSE 6 months;PRN;2 wks    Program Status Completed      Subsequent Visit   Since your last visit have you continued or begun to take your medications as prescribed? Yes    Since your last visit have you had your blood pressure checked? Yes    Is your most recent blood pressure lower, unchanged, or higher since your last visit? Lower    Since your last visit have you experienced any weight changes? No change   it goes up and down with dialysis per patient   Since your last visit, are you checking your blood glucose at least once a day? No          Individualized Plan for Diabetes Self-Management Training:   Learning Objective:  Patient will have a greater understanding of diabetes self-management. Patient  education plan is to attend individual and/or group sessions per assessed needs and concerns.   Plan:   Patient Instructions  Hi [name],  I'd like to follow up in about 2-3 weeks if you want my assistance with controlling your blood sugars using your Continuous glucose monitoring. 3- 6 months at the latest.   Here is some information about the Mounjaro :   Starting a GLP-1 RA Medication  Keep in the refrigerator most of the time. (  Can stay at room temperature for only 21 days)  Rotate injection sites.  Food Portions: Eat smaller portions than usual. Consider using a smaller plate or bowl. Eat more slowly. Stop eating at the first sign of fullness.   Meal Timing: Eat regularly, don't skip meals. Do not rely on hunger as a signal to eat.  Nutrient Quality: Choose and eat healthy foods Eat protein with each meal and snack. Consider eating protein first. Limit high fat,greasy foods. Limit spicy foods.  Decrease Nausea, Vomiting: On injection day, have largest meal before taking the medication. Eat small, frequent low fat meals Drink fluids per your kidney program: Best if not carbonated and do not contain caffeine  Constipation: Assure adequate fiber. ( 25-39 grams/day) Eat a variety of vegetables, fruits, whole grains and beans Increase physical activity  Can use half dose metamucil daily to prevent constipation; it's always best to obtain fiber from food.  I've included information about fiber in the kidney diet.   Expected Outcomes:  Demonstrated interest in learning but significant barriers to change  Education material provided: Diabetes Resources  If problems or questions, patient to contact team via:  Phone and Email  Future DSME appointment: 6 months, PRN, 2 wks Arland Hole, RD 10/03/2024 2:25 PM.

## 2024-10-07 ENCOUNTER — Telehealth: Payer: Self-pay | Admitting: Dietician

## 2024-10-07 NOTE — Telephone Encounter (Signed)
Mailbox was full, unable to leave a message.

## 2024-10-09 ENCOUNTER — Ambulatory Visit (HOSPITAL_COMMUNITY)
Admission: RE | Admit: 2024-10-09 | Discharge: 2024-10-09 | Disposition: A | Discharge status: Home / Self Care | Source: Ambulatory Visit | Attending: Vascular Surgery | Admitting: Vascular Surgery

## 2024-10-09 ENCOUNTER — Ambulatory Visit (HOSPITAL_BASED_OUTPATIENT_CLINIC_OR_DEPARTMENT_OTHER)
Admission: RE | Admit: 2024-10-09 | Discharge: 2024-10-09 | Disposition: A | Discharge status: Home / Self Care | Source: Ambulatory Visit | Attending: Vascular Surgery | Admitting: Vascular Surgery

## 2024-10-09 ENCOUNTER — Ambulatory Visit (INDEPENDENT_AMBULATORY_CARE_PROVIDER_SITE_OTHER): Admitting: Vascular Surgery

## 2024-10-09 ENCOUNTER — Encounter: Payer: Self-pay | Admitting: Vascular Surgery

## 2024-10-09 VITALS — BP 190/113 | HR 89 | Temp 98.0°F | Ht 65.0 in | Wt 192.0 lb

## 2024-10-09 DIAGNOSIS — N186 End stage renal disease: Secondary | ICD-10-CM

## 2024-10-09 DIAGNOSIS — Z992 Dependence on renal dialysis: Secondary | ICD-10-CM | POA: Insufficient documentation

## 2024-10-09 NOTE — Progress Notes (Signed)
 Patient ID: Jane [name], female   DOB: [birth year], 48 y.o.   MRN: [id number]  Reason for Consult: Follow-up   Referred by Marlee Bernardino NOVAK, MD  Subjective:     HPI:  Jane [name] is a 48 y.o. female with end-stage renal disease currently dialyzing via AV fistula.  Fistula was originally created in 2018 and was revised in 2019.  There are previous endovascular inventions of the fistula most recently underwent fistulogram with no intervention.  The visuals noted to be heavily calcified.  She continues to use this without complication.  She is right-hand dominant fistula was initially placed on the right given the presence of suitable cephalic vein.  Past Medical History:  Diagnosis Date   Abnormal uterine bleeding (AUB)    Anxiety    Arthritis of knee    Left, Gel and cortisone injections @ [facility]   Asthma    prn inhaler   Bipolar disorder (HCC)    CAD S/P BMS PCI to prox LAD cardiologist--- dr anner Pais LAD to Mid LAD lesion, 90% stenosed. Post intervention - Vision BMS 3.0 mm x 18 mm (~3.5 mm) there is a 0% residual stenosis. 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   left 2018; right 2019   Depression    Diabetic foot ulcer (HCC) 04/22/2019   Diabetic neuropathy (HCC)    feet   Diabetic retinopathy, nonproliferative, severe (HCC)    bilateral   Dyspnea    with too much fluid   ESRD (end stage renal disease) on dialysis [facility])    ?chronic interstitial nephritis  ([facility]   H/O non-ST elevation myocardial infarction (NSTEMI) 03/2016   Found in 90% mid LAD lesion treated with bare-metal stent (BMS) PCI - vision BMS 3.0 mm x 18 mm   Heart murmur    History of blood transfusion    History of MRSA infection 2007   Hyperlipidemia    Hypertension    IDA (iron deficiency anemia)    Insulin  dependent type 2 diabetes mellitus (HCC)    Iron deficiency  anemia    takes iron supplement   Myocardial infarction [facility])    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   PAOD (peripheral arterial occlusive disease)    vascular--- dr laurence   Pneumonia 11/08/2021   PONV (postoperative nausea and vomiting)    S/P arteriovenous (AV) fistula creation 07/2017   S/p bare metal coronary artery stent 03/31/2016   BMS x1  to pLAD   Sickle cell trait    Ulcer of left foot due to type 2 diabetes mellitus (HCC) 06/03/2019   Family History  Problem Relation Age of Onset   Stroke Mother    Diabetes Mother    Hypertension Mother    Aneurysm Mother    Diabetes Father    Hypertension Father    Diabetes Sister    Hypertension Sister    Diabetes Maternal Grandmother    Diabetes Maternal Grandfather    Cancer Paternal Grandfather        Prostate   Past Surgical History:  Procedure Laterality Date   A/V FISTULAGRAM N/A 04/13/2021   Procedure: A/V FISTULAGRAM - Right Arm;  Surgeon: Serene Gaile ORN, MD;  Location: [facility] INVASIVE [facility] LAB;  Service: Cardiovascular;  Laterality: N/A;   A/V SHUNT INTERVENTION Right 05/30/2024   Procedure: A/V SHUNT  INTERVENTION;  Surgeon: Sheree Penne Bruckner, MD;  Location: [facility] PV LAB;  Service: Cardiovascular;  Laterality: Right;   ACHILLES TENDON LENGTHENING Left 11/24/2016   Procedure: Left Achilles tendon lengthening (open);  Surgeon: Norleen Armor, MD;  Location: [facility];  Service: Orthopedics;  Laterality: Left;   AV FISTULA PLACEMENT Right 07/31/2017   Procedure: ARTERIOVENOUS BRACHIOCEPHALIC FISTULA CREATION RIGHT ARM;  Surgeon: Laurence Redell CROME, MD;  Location: [facility] OR;  Service: Vascular;  Laterality: Right;   BREAST BIOPSY Right 09/12/2024   MM RT BREAST BX W LOC DEV 1ST LESION IMAGE BX SPEC STEREO GUIDE 09/12/2024 [facility]   CALCANEAL OSTEOTOMY Left 11/24/2016   Procedure: Left hindfoot osteotomy and fusion;  Surgeon: Norleen Armor, MD;  Location: [facility];  Service: Orthopedics;  Laterality: Left;   CARDIAC CATHETERIZATION N/A 03/31/2016   Procedure: Left Heart Cath and Coronary Angiography;  Surgeon: Dorn JINNY Lesches, MD;  Location: [facility] INVASIVE CV LAB: 90% early mLAD, normal LV Fxn   CARDIAC CATHETERIZATION N/A 03/31/2016   Procedure: Coronary Stent Intervention;  Surgeon: Dorn JINNY Lesches, MD;  Location: [facility];  Service: Cardiovascular: PCI to mLAD BMS Vision 3.0 mm x 18 mm   CESAREAN SECTION  1997   CYSTOSCOPY N/A 11/13/2023   Procedure: CYSTOSCOPY;  Surgeon: Jeralyn Crutch, MD;  Location: [facility] OR;  Service: Gynecology;  Laterality: N/A;   CYSTOSCOPY WITH FULGERATION N/A 08/30/2022   Procedure: CYSTOSCOPY WITH FULGERATION, BIOPSY, LEFT RETROGRADE PYELOGRAM;  Surgeon: Elisabeth Valli BIRCH, MD;  Location: [facility] ORS;  Service: Urology;  Laterality: N/A;   DILITATION & CURRETTAGE/HYSTROSCOPY WITH NOVASURE ABLATION N/A 09/15/2020   Procedure: DILATATION & CURETTAGE/HYSTEROSCOPY;  Surgeon: Corene Coy, MD;  Location: [facility];  Service: Gynecology;  Laterality: N/A;   DILITATION & CURRETTAGE/HYSTROSCOPY WITH NOVASURE ABLATION N/A 12/21/2021   Procedure: DILATATION & CURETTAGE/HYSTEROSCOPY WITH NOVASURE ABLATION/ IUD REMOVAL;  Surgeon: Corene Coy, MD;  Location: [facility];  Service: Gynecology;  Laterality: N/A;   FISTULA SUPERFICIALIZATION Right 11/15/2017   Procedure: FISTULA SUPERFICIALIZATION RIGHT BRACHIOCEPHALIC  RIGHT;  Surgeon: Laurence Redell CROME, MD;  Location: [facility] OR;  Service: Vascular;  Laterality: Right;   FOOT SURGERY     multiple for charcot   PERIPHERAL VASCULAR BALLOON ANGIOPLASTY Right 04/13/2021   Procedure: PERIPHERAL VASCULAR BALLOON ANGIOPLASTY;  Surgeon: Serene Gaile ORN, MD;  Location: [facility];  Service: Cardiovascular;  Laterality: Right;  arm fistula   TOTAL KNEE ARTHROPLASTY Left 07/19/2023   Procedure: TOTAL KNEE ARTHROPLASTY;  Surgeon: Fidel Redell, MD;  Location: [facility];  Service: Orthopedics;  Laterality: Left;  150   TOTAL LAPAROSCOPIC HYSTERECTOMY WITH SALPINGECTOMY Bilateral 11/13/2023   Procedure: TOTAL LAPAROSCOPIC HYSTERECTOMY WITH SALPINGECTOMY;  Surgeon: Jeralyn Crutch, MD;  Location: [facility] OR;  Service: Gynecology;  Laterality: Bilateral;   TRANSTHORACIC ECHOCARDIOGRAM  02/2021   EF 55 to 60%.  No R WMA.  Mild septal LVH with moderate posterior hypertrophy.  Normal RV size and function.  Normal RVP/PASP.[name]  Trivial MR.   TUBAL LIGATION  1997    Short Social History:  Social History   Tobacco Use   Smoking status: Former    Current packs/day: 0.00    Types: Cigarettes    Quit date: 02/28/2015    Years since quitting: 9.6   Smokeless tobacco: Never  Substance Use Topics   Alcohol use: Not Currently    Comment: ~ 1 a month    Allergies  Allergen Reactions  Adhesive [Tape] Other (See Comments)    Irritation     Current Outpatient Medications  Medication Sig Dispense Refill   Accu-Chek Softclix Lancets lancets Check blood sugar once daily 100 each 3   acetaminophen  (TYLENOL ) 500 MG tablet Take 2 tablets (1,000 mg total) by mouth every 8 (eight) hours as needed for moderate pain, mild pain, fever or headache. 30 tablet 0   albuterol  (PROVENTIL  HFA) 108 (90 Base) MCG/ACT inhaler Inhale 2 puffs into the lungs every 4 (four) hours as needed for coughing, wheezing, or shortness of breath. 13.4 g 4   alum & mag hydroxide-simeth (MAALOX/MYLANTA) 200-200-20 MG/5ML suspension Take 15 mLs by mouth every 4 (four) hours as needed for indigestion or heartburn. 355 mL 0   azelastine  (ASTELIN ) 0.1 % nasal spray Place 1 spray into both nostrils 2 (two) times daily. Use in each nostril as directed 30 mL 1   Blood Glucose Monitoring Suppl (ACCU-CHEK GUIDE) w/Device KIT Use to check blood sugar up to 1 times a day 1 kit 1   cetirizine  (ZYRTEC ) 10 MG tablet Take 10 mg by mouth daily.     Continuous Glucose Sensor (DEXCOM G7  SENSOR) MISC Use it for 10 days to continuously monitor your blood sugars. 9 each 3   dicyclomine  (BENTYL ) 10 MG capsule Take 1 capsule (10 mg total) by mouth 3 (three) times daily before meals. 90 capsule 0   DULoxetine  (CYMBALTA ) 30 MG capsule Take 1 capsule (30 mg total) by mouth daily. 90 capsule 3   fluticasone  (FLONASE ) 50 MCG/ACT nasal spray Place 1 spray into both nostrils daily. 16 g 2   gabapentin  (NEURONTIN ) 100 MG capsule Take 1 capsule (100 mg total) by mouth at bedtime. 90 capsule 1   glucose blood (ACCU-CHEK GUIDE TEST) test strip Check blood sugar once daily 100 each 3   icosapent  Ethyl (VASCEPA ) 1 g capsule Take 2 capsules (2 g total) by mouth 2 (two) times daily. 360 capsule 3   injection device for insulin  (INPEN 100-PINK-LILLY-HUMALOG ) DEVI Use to inject Humalog  insulin  for meals and correction insulin  1 each 1   lanthanum  (FOSRENOL ) 1000 MG chewable tablet Chew 1 tablet (1,000 mg total) by mouth 3 (three) times daily with meals. 270 tablet 4   leflunomide  (ARAVA ) 10 MG tablet Take 1 tablet (10 mg total) by mouth daily. 30 tablet 2   methocarbamol  (ROBAXIN ) 500 MG tablet Take 1 tablet (500 mg total) by mouth every 6 (six) hours as needed for muscle spasms. 20 tablet 0   metoCLOPramide  (REGLAN ) 10 MG tablet Take 1 tablet by mouth once a day as needed 30 tablet 0   metoprolol  tartrate (LOPRESSOR ) 25 MG tablet Take 1 tablet (25 mg total) by mouth 2 (two) times daily. Do not take the night before dialysis, and take 2 hours after dialysis. 180 tablet 0   Multiple Vitamins-Minerals (HAIR SKIN & NAILS) TABS Take 1 tablet by mouth daily.     multivitamin (RENA-VIT) TABS tablet Take 1 tablet by mouth daily. 90 tablet 3   naloxone  (NARCAN ) nasal spray 4 mg/0.1 mL Place 1 spray into the nose as needed if found unresponsive and call 911 immediately. 2 each 12   omeprazole  (PRILOSEC) 20 MG capsule Take 1 capsule (20 mg total) by mouth daily. 30 capsule 1   oxyCODONE -acetaminophen  (PERCOCET)  10-325 MG tablet Take 1 tablet by mouth 5 (five) times daily as needed. 150 tablet 0   oxyCODONE -acetaminophen  (PERCOCET) 10-325 MG tablet Take 1 tablet by mouth 5 (  five) times daily as needed. 150 tablet 0   oxyCODONE -acetaminophen  (PERCOCET) 10-325 MG tablet Take 1 tablet by mouth 5 (five) times daily as needed. 150 tablet 0   oxyCODONE -acetaminophen  (PERCOCET) 10-325 MG tablet Take 1 tablet by mouth 5 (five) times daily as needed. 150 tablet 0   oxyCODONE -acetaminophen  (PERCOCET) 10-325 MG tablet Take 1 tablet by mouth 5 (five) times daily as needed. 150 tablet 0   polyethylene glycol (MIRALAX  / GLYCOLAX ) 17 g packet Take 17 g by mouth daily as needed for moderate constipation.     prochlorperazine  (COMPAZINE ) 10 MG tablet Take 1 tablet (10 mg total) by mouth every 6 (six) hours as needed for nausea or vomiting. 90 tablet 0   promethazine  (PHENERGAN ) 12.5 MG tablet Take 1 tablet (12.5 mg total) by mouth every 6 (six) hours as needed for nausea or vomiting. 30 tablet 0   rosuvastatin  (CRESTOR ) 40 MG tablet Take 1 tablet (40 mg total) by mouth daily. 90 tablet 3   sevelamer  carbonate (RENVELA ) 800 MG tablet Take 1,600-3,200 mg by mouth See admin instructions. Take 3200 mg with each meal and 1600 mg with each snack     tirzepatide  (MOUNJARO ) 5 MG/0.5ML Pen Inject 5 mg into the skin once a week for 8 doses. 2 mL 1   No current facility-administered medications for this visit.    Review of Systems  Constitutional:  Constitutional negative. HENT: HENT negative.  Eyes: Eyes negative.  Respiratory: Respiratory negative.  Cardiovascular: Cardiovascular negative.  GI: Gastrointestinal negative.  Musculoskeletal: Musculoskeletal negative.  Skin: Skin negative.  Neurological: Neurological negative. Hematologic: Hematologic/lymphatic negative.  Psychiatric: Psychiatric negative.        Objective:  Objective  Vitals:   10/09/24 1531  BP: (!) 190/113  Pulse: 89  Temp: 98 F (36.7 C)  SpO2:  98%     Physical Exam HENT:     Head: Normocephalic.     Nose: Nose normal.  Eyes:     Pupils: Pupils are equal, round, and reactive to light.  Cardiovascular:     Rate and Rhythm: Normal rate.  Pulmonary:     Effort: Pulmonary effort is normal.  Abdominal:     General: Abdomen is flat.  Musculoskeletal:     Comments: Heavily calcified fistula with pulsatility right upper extremity  Neurological:     Mental Status: She is alert.  Psychiatric:        Mood and Affect: Mood normal.     Data: Right Pre-Dialysis Findings:  +-----------------------+----------+--------------------+----------+-------  -+  Location              PSV (cm/s)Intralum. Diam. (cm)Waveform   Comments  +-----------------------+----------+--------------------+----------+-------  -+  Brachial Antecub. [id number]       0.56                monophasic           +-----------------------+----------+--------------------+----------+-------  -+  Radial Art at Wrist    123       0.25                triphasic            +-----------------------+----------+--------------------+----------+-------  -+  Ulnar Art at Wrist     58        0.25                triphasic            +-----------------------+----------+--------------------+----------+-------  -+       Left Pre-Dialysis Findings:  +-----------------------+----------+--------------------+---------+--------  +  Location              PSV (cm/s)Intralum. Diam. (cm)Waveform  Comments  +-----------------------+----------+--------------------+---------+--------  +  Brachial Antecub. [id number]       0.49                biphasic            +-----------------------+----------+--------------------+---------+--------  +  Radial Art at Wrist    89        0.29                triphasic           +-----------------------+----------+--------------------+---------+--------  +  Ulnar Art at Wrist     47        0.23                 triphasic           +-----------------------+----------+--------------------+---------+--------  +       Summary:    Right: Patent brachial, radial, and ulnar arteries. History of         brachiocephalic AVF.  Left: Patent brachial, radial, and ulnar arteries.       Assessment/Plan:    48 year old female currently dialyzing via calcified and pulsatile right upper extremity AV fistula that is noted to be severely diseased on recent fistulogram.  Given that it continues to work I recommended continued use.  If this does fail or she begins to have issues we would plan for tunneled dialysis catheter and likely conversion to graft on the right upper extremity given that she does not have suitable vein on either side.  The left side could also be used for graft if necessary as she is right-hand dominant.  Patient will call to follow-up as needed.     Penne Lonni Colorado MD [facility]

## 2024-10-14 ENCOUNTER — Other Ambulatory Visit (HOSPITAL_COMMUNITY): Payer: Self-pay

## 2024-10-15 ENCOUNTER — Other Ambulatory Visit (HOSPITAL_COMMUNITY): Payer: Self-pay

## 2024-10-22 ENCOUNTER — Other Ambulatory Visit (HOSPITAL_COMMUNITY): Payer: Self-pay

## 2024-10-25 ENCOUNTER — Other Ambulatory Visit (HOSPITAL_COMMUNITY): Payer: Self-pay

## 2024-10-25 MED ORDER — OXYCODONE-ACETAMINOPHEN 10-325 MG PO TABS
1.0000 | ORAL_TABLET | Freq: Three times a day (TID) | ORAL | 0 refills | Status: DC | PRN
  Filled 2024-10-25: qty 90, 30d supply, fill #0

## 2024-10-29 ENCOUNTER — Telehealth: Payer: Self-pay

## 2024-10-29 NOTE — Telephone Encounter (Signed)
 Copied from CRM #[id number]. Topic: Medical Record Request - Other >> Oct 29, 2024  9:15 AM Graeme ORN wrote: Reason for CRM: Received a call from [employer]. Checking on status of faxed request. Patient using their pharmacy. They are request current active med list - form needs to be completed and returned. Not showing in Media. Thank You

## 2024-10-29 NOTE — Telephone Encounter (Signed)
 [facility] pharmacy form is in the red team box, waiting for the doctor to complete. Once form is completed, I will faxed the med list.

## 2024-10-30 ENCOUNTER — Other Ambulatory Visit: Payer: Self-pay

## 2024-10-31 ENCOUNTER — Other Ambulatory Visit (HOSPITAL_COMMUNITY): Payer: Self-pay

## 2024-10-31 NOTE — Telephone Encounter (Signed)
 I called [facility] pharmacy and spoke with John-pharmacy technician regarding medication paperwork and meds list. Both form has been completed and faxed to [facility] pharmacy. Per Norleen, [facility] has received both documents along medications list. Advised John to call back if he needs further assistance.

## 2024-10-31 NOTE — Telephone Encounter (Unsigned)
 Copied from CRM #[id number]. Topic: General - Other >> Oct 31, 2024  1:16 PM Shamecia H wrote: Reason for CRM: Aly from [facility] is calling about the matter below: I informed her that the clinic is waiting on the provider to fill it out and then it would be faxed: Callback number is [phone]. Fax number is [phone].   Rcom, Hme, CMA 10/29/24 11:17 AM Note [facility] pharmacy form is in the red team box, waiting for the doctor to complete. Once form is completed, I will faxed the med list.   Luann Pedlar, CMA 10/29/24 11:15 AM Note Copied from CRM #[id number]. Topic: Medical Record Request - Other >> Oct 29, 2024  9:15 AM Graeme ORN wrote: Reason for CRM: Received a call from [employer]. Checking on status of faxed request. Patient using their pharmacy. They are request current active med list - form needs to be completed and returned. Not showing in Media. Thank You

## 2024-11-12 ENCOUNTER — Encounter: Payer: Self-pay | Admitting: *Deleted

## 2024-11-12 NOTE — Telephone Encounter (Signed)
Left voicemail for return call as needed.

## 2024-11-12 NOTE — Telephone Encounter (Signed)
"  A user error has taken place: encounter opened in error, closed for administrative reasons.      "

## 2024-11-19 ENCOUNTER — Other Ambulatory Visit (HOSPITAL_COMMUNITY): Payer: Self-pay

## 2024-11-19 MED ORDER — OXYCODONE-ACETAMINOPHEN 10-325 MG PO TABS
1.0000 | ORAL_TABLET | Freq: Three times a day (TID) | ORAL | 0 refills | Status: AC | PRN
  Filled 2024-11-23 – 2024-11-24 (×2): qty 90, 30d supply, fill #0

## 2024-11-22 ENCOUNTER — Other Ambulatory Visit: Payer: Self-pay

## 2024-11-22 ENCOUNTER — Other Ambulatory Visit (HOSPITAL_COMMUNITY): Payer: Self-pay

## 2024-11-23 ENCOUNTER — Other Ambulatory Visit (HOSPITAL_COMMUNITY): Payer: Self-pay

## 2024-11-24 ENCOUNTER — Other Ambulatory Visit: Payer: Self-pay

## 2024-11-24 ENCOUNTER — Other Ambulatory Visit (HOSPITAL_COMMUNITY): Payer: Self-pay

## 2024-12-02 ENCOUNTER — Other Ambulatory Visit (HOSPITAL_COMMUNITY): Payer: Self-pay
